# Patient Record
Sex: Male | Born: 1970
Health system: Southern US, Community
[De-identification: ages and names within clinical notes are randomized; demographics above are authoritative.]

## PROBLEM LIST (undated history)

## (undated) ENCOUNTER — Emergency Department (HOSPITAL_COMMUNITY): Payer: Medicare Other

## (undated) DIAGNOSIS — E1143 Type 2 diabetes mellitus with diabetic autonomic (poly)neuropathy: Secondary | ICD-10-CM

## (undated) DIAGNOSIS — R062 Wheezing: Secondary | ICD-10-CM

## (undated) DIAGNOSIS — K3184 Gastroparesis: Secondary | ICD-10-CM

## (undated) DIAGNOSIS — E8770 Fluid overload, unspecified: Secondary | ICD-10-CM

## (undated) DIAGNOSIS — N189 Chronic kidney disease, unspecified: Secondary | ICD-10-CM

## (undated) DIAGNOSIS — I10 Essential (primary) hypertension: Secondary | ICD-10-CM

## (undated) DIAGNOSIS — U071 COVID-19: Secondary | ICD-10-CM

## (undated) DIAGNOSIS — Z515 Encounter for palliative care: Secondary | ICD-10-CM

## (undated) DIAGNOSIS — D649 Anemia, unspecified: Secondary | ICD-10-CM

## (undated) DIAGNOSIS — N179 Acute kidney failure, unspecified: Secondary | ICD-10-CM

## (undated) DIAGNOSIS — I509 Heart failure, unspecified: Secondary | ICD-10-CM

## (undated) DIAGNOSIS — N186 End stage renal disease: Secondary | ICD-10-CM

## (undated) DIAGNOSIS — K859 Acute pancreatitis without necrosis or infection, unspecified: Secondary | ICD-10-CM

## (undated) DIAGNOSIS — R110 Nausea: Secondary | ICD-10-CM

## (undated) DIAGNOSIS — G5790 Unspecified mononeuropathy of unspecified lower limb: Secondary | ICD-10-CM

## (undated) DIAGNOSIS — R1013 Epigastric pain: Secondary | ICD-10-CM

## (undated) DIAGNOSIS — R0600 Dyspnea, unspecified: Secondary | ICD-10-CM

## (undated) DIAGNOSIS — R0902 Hypoxemia: Secondary | ICD-10-CM

## (undated) HISTORY — PX: EYE SURGERY: SHX253

---

## 1997-12-13 ENCOUNTER — Emergency Department (HOSPITAL_COMMUNITY): Admission: EM | Admit: 1997-12-13 | Discharge: 1997-12-13 | Payer: Self-pay | Admitting: Emergency Medicine

## 1999-09-01 ENCOUNTER — Emergency Department (HOSPITAL_COMMUNITY): Admission: EM | Admit: 1999-09-01 | Discharge: 1999-09-01 | Payer: Self-pay | Admitting: Emergency Medicine

## 1999-09-01 ENCOUNTER — Encounter: Payer: Self-pay | Admitting: Emergency Medicine

## 2002-12-21 ENCOUNTER — Emergency Department (HOSPITAL_COMMUNITY): Admission: EM | Admit: 2002-12-21 | Discharge: 2002-12-21 | Payer: Self-pay | Admitting: Emergency Medicine

## 2002-12-25 ENCOUNTER — Emergency Department (HOSPITAL_COMMUNITY): Admission: EM | Admit: 2002-12-25 | Discharge: 2002-12-25 | Payer: Self-pay | Admitting: Emergency Medicine

## 2002-12-26 ENCOUNTER — Emergency Department (HOSPITAL_COMMUNITY): Admission: EM | Admit: 2002-12-26 | Discharge: 2002-12-26 | Payer: Self-pay | Admitting: Emergency Medicine

## 2003-09-26 ENCOUNTER — Emergency Department (HOSPITAL_COMMUNITY): Admission: EM | Admit: 2003-09-26 | Discharge: 2003-09-27 | Payer: Self-pay | Admitting: Emergency Medicine

## 2004-07-05 ENCOUNTER — Emergency Department (HOSPITAL_COMMUNITY): Admission: EM | Admit: 2004-07-05 | Discharge: 2004-07-05 | Payer: Self-pay | Admitting: Emergency Medicine

## 2005-09-26 ENCOUNTER — Emergency Department (HOSPITAL_COMMUNITY): Admission: EM | Admit: 2005-09-26 | Discharge: 2005-09-26 | Payer: Self-pay | Admitting: Emergency Medicine

## 2006-05-04 ENCOUNTER — Emergency Department (HOSPITAL_COMMUNITY): Admission: EM | Admit: 2006-05-04 | Discharge: 2006-05-04 | Payer: Self-pay | Admitting: Emergency Medicine

## 2007-12-21 ENCOUNTER — Inpatient Hospital Stay (HOSPITAL_COMMUNITY): Admission: EM | Admit: 2007-12-21 | Discharge: 2007-12-23 | Payer: Self-pay | Admitting: Emergency Medicine

## 2007-12-29 ENCOUNTER — Ambulatory Visit: Payer: Self-pay | Admitting: Internal Medicine

## 2007-12-30 ENCOUNTER — Encounter (INDEPENDENT_AMBULATORY_CARE_PROVIDER_SITE_OTHER): Payer: Self-pay | Admitting: Internal Medicine

## 2007-12-30 ENCOUNTER — Ambulatory Visit: Payer: Self-pay | Admitting: *Deleted

## 2007-12-30 LAB — CONVERTED CEMR LAB
BUN: 13 mg/dL (ref 6–23)
Basophils Relative: 0 % (ref 0–1)
Calcium: 9.8 mg/dL (ref 8.4–10.5)
Eosinophils Absolute: 0 10*3/uL (ref 0.0–0.7)
MCHC: 33 g/dL (ref 30.0–36.0)
MCV: 86.6 fL (ref 78.0–100.0)
Neutrophils Relative %: 56 % (ref 43–77)
Platelets: 223 10*3/uL (ref 150–400)
Potassium: 4.2 meq/L (ref 3.5–5.3)
Sodium: 140 meq/L (ref 135–145)
TSH: 1.302 microintl units/mL (ref 0.350–4.50)

## 2008-03-31 ENCOUNTER — Emergency Department (HOSPITAL_COMMUNITY): Admission: EM | Admit: 2008-03-31 | Discharge: 2008-03-31 | Payer: Self-pay | Admitting: Emergency Medicine

## 2008-04-28 ENCOUNTER — Ambulatory Visit: Payer: Self-pay | Admitting: Internal Medicine

## 2008-09-27 ENCOUNTER — Ambulatory Visit: Payer: Self-pay | Admitting: Internal Medicine

## 2008-09-27 LAB — CONVERTED CEMR LAB: Microalb, Ur: 3.33 mg/dL — ABNORMAL HIGH (ref 0.00–1.89)

## 2009-06-15 ENCOUNTER — Ambulatory Visit: Payer: Self-pay | Admitting: Internal Medicine

## 2009-06-15 LAB — CONVERTED CEMR LAB
Calcium: 9.6 mg/dL (ref 8.4–10.5)
Creatinine, Ser: 0.94 mg/dL (ref 0.40–1.50)
Glucose, Bld: 260 mg/dL — ABNORMAL HIGH (ref 70–99)
Sodium: 136 meq/L (ref 135–145)

## 2009-06-30 ENCOUNTER — Emergency Department (HOSPITAL_COMMUNITY): Admission: EM | Admit: 2009-06-30 | Discharge: 2009-06-30 | Payer: Self-pay | Admitting: Family Medicine

## 2009-07-27 ENCOUNTER — Ambulatory Visit: Payer: Self-pay | Admitting: Internal Medicine

## 2009-07-27 LAB — CONVERTED CEMR LAB
ALT: 20 units/L (ref 0–53)
AST: 19 units/L (ref 0–37)
Albumin: 4.6 g/dL (ref 3.5–5.2)
Alkaline Phosphatase: 102 units/L (ref 39–117)
Amphetamine Screen, Ur: NEGATIVE
Basophils Absolute: 0 10*3/uL (ref 0.0–0.1)
Benzodiazepines.: NEGATIVE
Calcium: 9.6 mg/dL (ref 8.4–10.5)
Chloride: 95 meq/L — ABNORMAL LOW (ref 96–112)
Cocaine Metabolites: NEGATIVE
Creatinine,U: 81.6 mg/dL
Eosinophils Relative: 1 % (ref 0–5)
HCT: 46.9 % (ref 39.0–52.0)
LDL Cholesterol: 68 mg/dL (ref 0–99)
Lymphocytes Relative: 26 % (ref 12–46)
Lymphs Abs: 2 10*3/uL (ref 0.7–4.0)
Neutro Abs: 4.8 10*3/uL (ref 1.7–7.7)
Neutrophils Relative %: 64 % (ref 43–77)
Platelets: 200 10*3/uL (ref 150–400)
Potassium: 4 meq/L (ref 3.5–5.3)
Sodium: 136 meq/L (ref 135–145)
TSH: 0.749 microintl units/mL (ref 0.350–4.500)
Total Protein: 8.2 g/dL (ref 6.0–8.3)
WBC: 7.6 10*3/uL (ref 4.0–10.5)

## 2010-02-07 ENCOUNTER — Ambulatory Visit: Payer: Self-pay | Admitting: Internal Medicine

## 2010-02-07 LAB — CONVERTED CEMR LAB: Microalb, Ur: 1.34 mg/dL (ref 0.00–1.89)

## 2010-06-01 ENCOUNTER — Encounter (INDEPENDENT_AMBULATORY_CARE_PROVIDER_SITE_OTHER): Payer: Self-pay | Admitting: Internal Medicine

## 2010-06-01 LAB — CONVERTED CEMR LAB
ALT: 73 units/L — ABNORMAL HIGH (ref 0–53)
AST: 67 units/L — ABNORMAL HIGH (ref 0–37)
CO2: 24 meq/L (ref 19–32)
Calcium: 10.1 mg/dL (ref 8.4–10.5)
Chloride: 95 meq/L — ABNORMAL LOW (ref 96–112)
Cholesterol: 177 mg/dL (ref 0–200)
Creatinine, Ser: 0.9 mg/dL (ref 0.40–1.50)
Sodium: 136 meq/L (ref 135–145)
Total Bilirubin: 0.7 mg/dL (ref 0.3–1.2)
Total CHOL/HDL Ratio: 2
Total Protein: 8.7 g/dL — ABNORMAL HIGH (ref 6.0–8.3)
VLDL: 18 mg/dL (ref 0–40)

## 2010-10-30 NOTE — H&P (Signed)
NAMEHURSEL, MCNELL NO.:  [id number]   MEDICAL RECORD NO.:  [id number]          PATIENT TYPE:  INP   LOCATION:  0104                         FACILITY[facility]  [facility]   PHYSICIAN:  Rise Patience, MDDATE OF BIRTH:  July 29, 1970   DATE OF ADMISSION:  12/21/2007  DATE OF DISCHARGE:                              HISTORY & PHYSICAL   CHIEF COMPLAINT:  Chest pain.   HISTORY OF PRESENTING ILLNESS:  A 40 year old male who was recently  diagnosed with diabetes mellitus type 2 and hypertension last month  presented to the ER complaining of chest pain.  The patient stated that  he has been having chest pain the last 3-4 days and he has been not  taking his medication as he was not able to afford it.  Chest pain is  retrosternal, moving to his left arm, was there for the last 3-4 days.  It is happening episodically, not with exertion.  Lasts around a half an  hour to 1 hour.  Associated with chest pain with shortness of breath,  but today the chest pain was persistent since 6 a.m. when he came to the  ER.  Now the patient is presently chest pain free.  Denies any  dizziness, loss of consciousness, nausea, vomiting, diarrhea, fever,  chills, cough, or any weakness of limbs.  The patient has been admitted  for further management and evaluation of his chest pain.   PAST MEDICAL HISTORY:  Hypertension and diabetes mellitus type 2 which  was recently diagnosed.   PAST SURGICAL HISTORY:  None.   SOCIAL HISTORY:  The patient usually smokes cigarettes, quit a year ago.  Drinks alcohol occasionally, once a week.  Denies any drug abuse.   FAMILY HISTORY:  Significant for diabetes and hypertension.   MEDICATIONS PRIOR TO ADMISSION:  None, but was told to take lisinopril,  hydrochlorothiazide, Glucotrol and metformin.   ALLERGIES:  No known drug allergies.   REVIEW OF SYSTEMS:  As in history of presenting illness.  Nothing else  significant.   PHYSICAL EXAMINATION:  The patient  examined at bedside, not in acute  distress.  VITAL SIGNS:  Blood pressure is 134/91, though at times it had gone from  168/110, and heart rate presently is 90 per minute but had gone up to  114 per minute, respirations 18 per minute, temperature 97.3, O2  saturation 100%.  HEENT:  Anicteric, no conjunctival pallor.  CHEST:  Bilateral air entry present.  No rhonchi, no crepitation.  HEART:  S1, S2 heard.  ABDOMEN:  Soft, nontender, bowel sounds heard.  No guarding, no  rigidity.  CENTRAL NERVOUS SYSTEM:  Alert, awake, oriented to time, place and  person.  Moves upper and lower extremities 5/5.  Peripheral pulses felt.  No edema.   LABORATORIES:  Chest x-ray:  Nothing acute.  EKG:  Normal sinus rhythm  with no acute ST-T changes.  Hemoglobin and hematocrit is 17.3 and 51.  D-dimer is 0.28.  PT/INR is 12.8 and 0.9.  Basic metabolic panel:  Sodium Q000111Q, potassium 4.2, chloride 104, glucose 249, BUN 14, creatinine  1,  lipase 40.  CK-MB less than 1 x2, troponin I less than  0.05 x2.  Drug screen is negative.  UA is showing glucose more than 1000, ketones  trace, nitrite and leukocytes negative, WBC negative.   ASSESSMENT:  1. Chest pain, to rule out acute coronary syndrome.  2. Uncontrolled hypertension.  3. Uncontrolled diabetes mellitus type 2.   PLAN:  Admit the patient to telemetry under Team C of [place].  Will cycle cardiac markers.  Will get a CT of the chest.  Will place the  patient on metoprolol and lisinopril.  Recheck basic metabolic STAT to  check for anion gap.  If anion gap is high we probably need IV insulin.  Place patient on Lantus insulin for now.  Further recommendations as  patient condition evolves.      Rise Patience, MD  Electronically Signed     ANK/MEDQ  D:  12/21/2007  T:  12/21/2007  Job:  [id number]

## 2010-10-30 NOTE — Discharge Summary (Signed)
NAMEMarland Kitchen  Christopher Davila, Christopher Davila NO.:  [id number]   MEDICAL RECORD NO.:  [id number]          PATIENT TYPE:  INP   LOCATION:  [id number]                         FACILITY:  [facility]   PHYSICIAN:  Rise Patience, MDDATE OF BIRTH:  07-25-1970   DATE OF ADMISSION:  12/21/2007  DATE OF DISCHARGE:                               DISCHARGE SUMMARY   HOSPITAL COURSE:  A 40 year old male with recently diagnosed diabetes  mellitus type 2 and hypertension a month ago presented with chest pain.  On admission, the patient also was found to have high blood pressures  and blood sugar was more than 200.  The patient was admitted for further  workup and management of the chest pain and uncontrolled blood pressure  and diabetes.  On admission, the patient had an EKG which did not show  any acute findings.  Cardiac enzymes were followed serially which also  were within normal limits.  CT angio of the chest was done which did not  show any pulmonary emboli or any thoracic aortic aneurysm or dissection.  As an outpatient, the patient did not take his medication as advised due  to some financial reasons.  At this time, the patient's chest pain has  completely resolved, his blood pressure is well controlled, and his  blood sugars are getting more reasonably controlled.  During his stay,  Amaryl was added along with sliding scale insulin.  Metformin will be  restarted on December 24, 2007, to give more than 48 hours break after his  CT chest with contrast.  At this time, arrangements are being made to  discharge the patient home.   FINAL DIAGNOSES:  1. Atypical chest pains.  2. Uncontrolled hypertension.  3. Uncontrolled diabetes mellitus type 2.   DISCHARGE MEDICATIONS:  1. Glimepiride 2 mg p.o. daily.  2. Metformin 500 mg p.o. daily.  3. Metoprolol 25 mg p.o. b.i.d.  4. Lisinopril 20 mg p.o. daily.   PLAN:  The patient advised to follow up with his primary care physician  within 3 days and recheck  his basic metabolic panel.  The patient  advised to be cautious about hyperglycemia.  To restart his metformin  only from December 24, 2007.  The patient to check his blood sugar daily in  a.m., to be compliant with his medications.      Rise Patience, MD  Electronically Signed     ANK/MEDQ  D:  12/23/2007  T:  12/23/2007  Job:  [id number]

## 2011-01-20 ENCOUNTER — Inpatient Hospital Stay (INDEPENDENT_AMBULATORY_CARE_PROVIDER_SITE_OTHER)
Admission: RE | Admit: 2011-01-20 | Discharge: 2011-01-20 | Disposition: A | Discharge status: Home / Self Care | Payer: Self-pay | Source: Ambulatory Visit | Attending: Family Medicine | Admitting: Family Medicine

## 2011-01-20 ENCOUNTER — Ambulatory Visit (INDEPENDENT_AMBULATORY_CARE_PROVIDER_SITE_OTHER): Payer: Self-pay

## 2011-01-20 DIAGNOSIS — IMO0002 Reserved for concepts with insufficient information to code with codable children: Secondary | ICD-10-CM

## 2011-03-14 LAB — LIPID PANEL
HDL: 52
LDL Cholesterol: 47
Triglycerides: 114
VLDL: 23

## 2011-03-14 LAB — POCT I-STAT, CHEM 8
BUN: 14
Chloride: 104
Creatinine, Ser: 1
Potassium: 4.2
Sodium: 134 — ABNORMAL LOW
TCO2: 24

## 2011-03-14 LAB — CARDIAC PANEL(CRET KIN+CKTOT+MB+TROPI)
CK, MB: 0.8
Relative Index: 0.5
Relative Index: 0.5

## 2011-03-14 LAB — URINALYSIS, ROUTINE W REFLEX MICROSCOPIC
Glucose, UA: >1000 — AB
Hgb urine dipstick: NEGATIVE
Protein, ur: NEGATIVE
Urobilinogen, UA: 0.2

## 2011-03-14 LAB — LIPASE, BLOOD: Lipase: 40

## 2011-03-14 LAB — RAPID URINE DRUG SCREEN, HOSP PERFORMED
Amphetamines: NOT DETECTED
Barbiturates: NOT DETECTED
Benzodiazepines: NOT DETECTED
Cocaine: NOT DETECTED
Opiates: NOT DETECTED
Tetrahydrocannabinol: NOT DETECTED

## 2011-03-14 LAB — PROTIME-INR
INR: 0.9
Prothrombin Time: 12.8

## 2011-03-14 LAB — BASIC METABOLIC PANEL
BUN: 10
BUN: 12
CO2: 23
CO2: 25
Calcium: 8.4
Chloride: 105
Creatinine, Ser: 0.81
Glucose, Bld: 161 — ABNORMAL HIGH
Sodium: 137

## 2011-03-14 LAB — POCT CARDIAC MARKERS
CKMB, poc: <1 — ABNORMAL LOW
CKMB, poc: <1 — ABNORMAL LOW
Myoglobin, poc: 38.6
Operator id: 264031

## 2011-03-14 LAB — HEMOGLOBIN A1C: Hgb A1c MFr Bld: 9.4 — ABNORMAL HIGH

## 2011-03-14 LAB — URINE MICROSCOPIC-ADD ON

## 2011-03-19 LAB — DIFFERENTIAL
Basophils Absolute: 0.1
Basophils Relative: 1
Eosinophils Absolute: 0
Eosinophils Relative: 0
Lymphocytes Relative: 23
Lymphs Abs: 2
Monocytes Absolute: 0.8
Monocytes Relative: 9
Neutro Abs: 5.7
Neutrophils Relative %: 66

## 2011-03-19 LAB — CBC
HCT: 46.5
Hemoglobin: 15.6
MCV: 89.6
RBC: 5.19
WBC: 8.7

## 2011-03-19 LAB — BASIC METABOLIC PANEL
Chloride: 102
GFR calc Af Amer: >60
Potassium: 4

## 2011-03-25 ENCOUNTER — Inpatient Hospital Stay (HOSPITAL_COMMUNITY)
Admission: RE | Admit: 2011-03-25 | Discharge: 2011-03-25 | Disposition: A | Discharge status: Home / Self Care | Payer: Self-pay | Source: Ambulatory Visit | Attending: Family Medicine | Admitting: Family Medicine

## 2011-04-14 ENCOUNTER — Emergency Department (HOSPITAL_COMMUNITY)
Admission: EM | Admit: 2011-04-14 | Discharge: 2011-04-15 | Discharge status: Against Medical Advice | Payer: Self-pay | Attending: Emergency Medicine | Admitting: Emergency Medicine

## 2011-04-15 ENCOUNTER — Emergency Department (HOSPITAL_COMMUNITY)
Admission: EM | Admit: 2011-04-15 | Discharge: 2011-04-15 | Disposition: A | Discharge status: Home / Self Care | Payer: Self-pay | Attending: Emergency Medicine | Admitting: Emergency Medicine

## 2011-04-15 ENCOUNTER — Emergency Department (HOSPITAL_COMMUNITY): Payer: Self-pay

## 2011-04-15 DIAGNOSIS — I10 Essential (primary) hypertension: Secondary | ICD-10-CM | POA: Insufficient documentation

## 2011-04-15 DIAGNOSIS — E785 Hyperlipidemia, unspecified: Secondary | ICD-10-CM | POA: Insufficient documentation

## 2011-04-15 DIAGNOSIS — S61409A Unspecified open wound of unspecified hand, initial encounter: Secondary | ICD-10-CM | POA: Insufficient documentation

## 2011-04-15 DIAGNOSIS — Z79899 Other long term (current) drug therapy: Secondary | ICD-10-CM | POA: Insufficient documentation

## 2011-04-15 DIAGNOSIS — W268XXA Contact with other sharp object(s), not elsewhere classified, initial encounter: Secondary | ICD-10-CM | POA: Insufficient documentation

## 2011-04-15 DIAGNOSIS — E119 Type 2 diabetes mellitus without complications: Secondary | ICD-10-CM | POA: Insufficient documentation

## 2011-04-30 ENCOUNTER — Encounter: Payer: Self-pay | Admitting: Cardiology

## 2011-04-30 ENCOUNTER — Emergency Department (INDEPENDENT_AMBULATORY_CARE_PROVIDER_SITE_OTHER)
Admission: EM | Admit: 2011-04-30 | Discharge: 2011-04-30 | Disposition: A | Discharge status: Home / Self Care | Payer: Self-pay | Source: Home / Self Care | Attending: Emergency Medicine | Admitting: Emergency Medicine

## 2011-04-30 DIAGNOSIS — Z4802 Encounter for removal of sutures: Secondary | ICD-10-CM

## 2011-04-30 HISTORY — DX: Unspecified mononeuropathy of unspecified lower limb: G57.90

## 2011-04-30 HISTORY — DX: Essential (primary) hypertension: I10

## 2011-04-30 NOTE — ED Notes (Signed)
Pt here for suture removal from dorsal side of left hand. Edges of wound well approximated. No redness or drainage. Denies fever.

## 2011-04-30 NOTE — ED Provider Notes (Signed)
History     CSN: [id number] Arrival date & time: 04/30/2011  9:51 AM   First MD Initiated Contact with Patient 04/30/11 [phone]      Chief Complaint  Patient presents with  . Suture / Staple Removal    suture removal left dorsal side of hand    Patient is a 40 y.o. male presenting with suture removal. The history is provided by the patient.  Suture / Staple Removal  The sutures were placed 7 to 10 days ago. Treatments since wound repair include a wound recheck. His temperature was unmeasured prior to arrival. There has been no drainage from the wound. There is no redness present. There is no swelling present. The pain has no pain. He has no difficulty moving the affected extremity or digit.    Past Medical History  Diagnosis Date  . Diabetes mellitus   . Hypertension   . Neuropathy of lower extremity     History reviewed. No pertinent past surgical history.  Family History  Problem Relation Age of Onset  . Diabetes Mother   . Diabetes Maternal Aunt     History  Substance Use Topics  . Smoking status: Never Smoker   . Smokeless tobacco: Not on file  . Alcohol Use: Yes     occas      Review of Systems  Skin: Negative.   Neurological: Negative for weakness and numbness.    Allergies  Review of patient's allergies indicates no known allergies.  Home Medications   Current Outpatient Rx  Name Route Sig Dispense Refill  . LISINOPRIL PO Oral Take 25 mg by mouth daily.      Marland Kitchen METFORMIN HCL 1000 MG PO TABS Oral Take 1,000 mg by mouth 2 (two) times daily with a meal.      . METOPROLOL SUCCINATE 25 MG PO TB24 Oral Take 25 mg by mouth daily.      . TRAMADOL HCL 50 MG PO TABS Oral Take 50 mg by mouth every 6 (six) hours as needed. Maximum dose= 8 tablets per day       BP 154/103  Pulse 84  Temp(Src) 98.3 F (36.8 C) (Oral)  Resp 16  SpO2 100%  Physical Exam  Constitutional: He appears well-developed and well-nourished.  Musculoskeletal: He exhibits no tenderness.        Hands: Neurological: He is alert.  Skin: No rash noted. No erythema.    ED Course  Procedures (including critical care time)  Labs Reviewed - No data to display No results found.   1. Encounter for removal of sutures       MDM  UNCOMPLICATED suture removal         Rosana Hoes, MD 04/30/11 1759

## 2011-12-12 ENCOUNTER — Encounter (HOSPITAL_COMMUNITY): Payer: Self-pay | Admitting: Emergency Medicine

## 2011-12-12 ENCOUNTER — Emergency Department (HOSPITAL_COMMUNITY)
Admission: EM | Admit: 2011-12-12 | Discharge: 2011-12-13 | Disposition: A | Discharge status: Home / Self Care | Payer: Self-pay | Attending: Emergency Medicine | Admitting: Emergency Medicine

## 2011-12-12 DIAGNOSIS — S39012A Strain of muscle, fascia and tendon of lower back, initial encounter: Secondary | ICD-10-CM

## 2011-12-12 DIAGNOSIS — I10 Essential (primary) hypertension: Secondary | ICD-10-CM | POA: Insufficient documentation

## 2011-12-12 DIAGNOSIS — G609 Hereditary and idiopathic neuropathy, unspecified: Secondary | ICD-10-CM | POA: Insufficient documentation

## 2011-12-12 DIAGNOSIS — X58XXXA Exposure to other specified factors, initial encounter: Secondary | ICD-10-CM | POA: Insufficient documentation

## 2011-12-12 DIAGNOSIS — E119 Type 2 diabetes mellitus without complications: Secondary | ICD-10-CM | POA: Insufficient documentation

## 2011-12-12 DIAGNOSIS — Z79899 Other long term (current) drug therapy: Secondary | ICD-10-CM | POA: Insufficient documentation

## 2011-12-12 DIAGNOSIS — S335XXA Sprain of ligaments of lumbar spine, initial encounter: Secondary | ICD-10-CM | POA: Insufficient documentation

## 2011-12-12 NOTE — ED Notes (Signed)
Pt on back board for spinal immobilization per EMS.

## 2011-12-12 NOTE — ED Notes (Signed)
Pt states he was at a barbeque last week when he had a sudden onset of sharp back pain in his mid upper back. Pt denies injury. Pt states he has been drinking alcohol this pm PTA. Pt reports having four beers and one glass of liquor.

## 2011-12-12 NOTE — ED Notes (Signed)
[id number] Expected date:12/12/11<BR> Expected time:11:24 PM<BR> Means of arrival:Ambulance<BR> Comments:<BR> Back pain/ETOH

## 2011-12-13 LAB — POCT I-STAT, CHEM 8
BUN: 8 mg/dL (ref 6–23)
Chloride: 102 mEq/L (ref 96–112)
Sodium: 140 mEq/L (ref 135–145)

## 2011-12-13 LAB — GLUCOSE, CAPILLARY: Glucose-Capillary: 252 mg/dL — ABNORMAL HIGH (ref 70–99)

## 2011-12-13 MED ORDER — METFORMIN HCL 1000 MG PO TABS: 1000.0000 mg | ORAL_TABLET | Freq: Two times a day (BID) | ORAL | Status: DC

## 2011-12-13 MED ORDER — LISINOPRIL 20 MG PO TABS: 20.0000 mg | ORAL_TABLET | Freq: Every day | ORAL | Status: DC

## 2011-12-13 MED ORDER — METOPROLOL SUCCINATE ER 25 MG PO TB24: 25.0000 mg | ORAL_TABLET | Freq: Every day | ORAL | Status: DC

## 2011-12-13 MED ORDER — SODIUM CHLORIDE 0.9 % IV BOLUS (SEPSIS)
1000.0000 mL | Freq: Once | INTRAVENOUS | Status: AC
  Administered 2011-12-13: 1000 mL via INTRAVENOUS

## 2011-12-13 NOTE — ED Provider Notes (Signed)
History     CSN: [id number]  Arrival date & time 12/12/11  2335   First MD Initiated Contact with Patient 12/13/11 0009      Chief Complaint  Patient presents with  . Back Pain    Spontaneous back pain for one week; denies injury    HPI  History provided by the patient. Patient is a 41 year old male with history of hypertension and diabetes who presents with complaints of chronic low back pains. Patient reports long history of similar back pains. Patient had a sharp increase in pains one week ago that has been waxing and waning since that time.  Pain increased today.  Patient denies any new injury or trauma.  patient has not taken anything for his symptoms. Patient does admit to some alcohol use this evening while at a party.this has helped some with pains. Patient denies any other aggravating or alleviating factors. He denies any other associated symptoms. Denies any numbness weakness in lower extremities. Denies any urinary or fecal incontinence, urinary retention or perineal numbness.     Past Medical History  Diagnosis Date  . Diabetes mellitus   . Hypertension   . Neuropathy of lower extremity     History reviewed. No pertinent past surgical history.  Family History  Problem Relation Age of Onset  . Diabetes Mother   . Diabetes Maternal Aunt     History  Substance Use Topics  . Smoking status: Never Smoker   . Smokeless tobacco: Not on file  . Alcohol Use: Yes     occas      Review of Systems  HENT: Positive for neck pain.   Respiratory: Negative for shortness of breath.   Cardiovascular: Negative for chest pain.  Gastrointestinal: Negative for nausea, vomiting and abdominal pain.  Genitourinary: Negative for dysuria, frequency, hematuria and flank pain.  Musculoskeletal: Positive for back pain.    Allergies  Review of patient's allergies indicates no known allergies.  Home Medications   Current Outpatient Rx  Name Route Sig Dispense Refill  .  LISINOPRIL 20 MG PO TABS Oral Take 20 mg by mouth daily.    Marland Kitchen METFORMIN HCL 1000 MG PO TABS Oral Take 1,000 mg by mouth 2 (two) times daily with a meal.      . METOPROLOL SUCCINATE ER 25 MG PO TB24 Oral Take 25 mg by mouth daily.       BP 129/74  Pulse 102  Temp 98.2 F (36.8 C) (Oral)  Resp 16  Ht 5\' 3"  (1.6 m)  Wt 160 lb (72.576 kg)  BMI 28.34 kg/m2  SpO2 97%  Physical Exam  Nursing note and vitals reviewed. Constitutional: He is oriented to person, place, and time. He appears well-developed and well-nourished. No distress.  HENT:  Head: Normocephalic and atraumatic.  Cardiovascular: Normal rate and regular rhythm.   Pulmonary/Chest: Effort normal and breath sounds normal. No respiratory distress. He has no wheezes. He has no rales.  Abdominal: Soft. He exhibits no distension. There is no tenderness. There is no rebound, no guarding and no CVA tenderness.  Musculoskeletal: Normal range of motion.       Lumbar back: He exhibits tenderness.       Back:       Mild back tenderness.  Neurological: He is alert and oriented to person, place, and time. Gait normal.  Skin: Skin is warm.  Psychiatric: He has a normal mood and affect. His behavior is normal.    ED Course  Procedures  Results for orders placed during the hospital encounter of 12/12/11  GLUCOSE, CAPILLARY      Component Value Range   Glucose-Capillary 252 (*) 70 - 99 mg/dL  POCT I-STAT, CHEM 8      Component Value Range   Sodium 140  135 - 145 mEq/L   Potassium 3.6  3.5 - 5.1 mEq/L   Chloride 102  96 - 112 mEq/L   BUN 8  6 - 23 mg/dL   Creatinine, Ser 1.10  0.50 - 1.35 mg/dL   Glucose, Bld 252 (*) 70 - 99 mg/dL   Calcium, Ion 1.12  1.12 - 1.32 mmol/L   TCO2 22  0 - 100 mmol/L   Hemoglobin 16.3  13.0 - 17.0 g/dL   HCT 48.0  39.0 - 52.0 %      1. Lumbar strain       MDM  Patient no acute distress. Patient reports history of similar back pains. No new injury or trauma. Patient denies any fall  tonight.  Patient does admit to some alcohol use tonight patient is a mature without assistance, it is not slurred speech, patient is not appear clinically intoxicated       Martie Lee, [place] 12/13/11 [phone]

## 2011-12-13 NOTE — ED Notes (Signed)
Pt taken off spine board by PA.

## 2011-12-13 NOTE — Discharge Instructions (Signed)
You were seen and evaluated for your complaints of low back pains. At this time your providers feel your symptoms cause from a concerning condition. Your blood sugar was found to be slightly elevated today. Your given IV fluids to help with this. Please continue your medications as prescribed for your blood pressure and diabetes.  Please followup with a primary care provider for continued evaluation and treatment.   Back Pain, Adult Low back pain is very common. About 1 in 5 people have back pain.The cause of low back pain is rarely dangerous. The pain often gets better over time.About half of people with a sudden onset of back pain feel better in just 2 weeks. About 8 in 10 people feel better by 6 weeks.  CAUSES Some common causes of back pain include:  Strain of the muscles or ligaments supporting the spine.   Wear and tear (degeneration) of the spinal discs.   Arthritis.   Direct injury to the back.  DIAGNOSIS Most of the time, the direct cause of low back pain is not known.However, back pain can be treated effectively even when the exact cause of the pain is unknown.Answering your caregiver's questions about your overall health and symptoms is one of the most accurate ways to make sure the cause of your pain is not dangerous. If your caregiver needs more information, he or she may order lab work or imaging tests (X-rays or MRIs).However, even if imaging tests show changes in your back, this usually does not require surgery. HOME CARE INSTRUCTIONS For many people, back pain returns.Since low back pain is rarely dangerous, it is often a condition that people can learn to [facility] their own.   Remain active. It is stressful on the back to sit or stand in one place. Do not sit, drive, or stand in one place for more than 30 minutes at a time. Take short walks on level surfaces as soon as pain allows.Try to increase the length of time you walk each day.   Do not stay in bed.Resting more  than 1 or 2 days can delay your recovery.   Do not avoid exercise or work.Your body is made to move.It is not dangerous to be active, even though your back may hurt.Your back will likely heal faster if you return to being active before your pain is gone.   Pay attention to your body when you bend and lift. Many people have less discomfortwhen lifting if they bend their knees, keep the load close to their bodies,and avoid twisting. Often, the most comfortable positions are those that put less stress on your recovering back.   Find a comfortable position to sleep. Use a firm mattress and lie on your side with your knees slightly bent. If you lie on your back, put a pillow under your knees.   Only take over-the-counter or prescription medicines as directed by your caregiver. Over-the-counter medicines to reduce pain and inflammation are often the most helpful.Your caregiver may prescribe muscle relaxant drugs.These medicines help dull your pain so you can more quickly return to your normal activities and healthy exercise.   Put ice on the injured area.   Put ice in a plastic bag.   Place a towel between your skin and the bag.   Leave the ice on for 15 to 20 minutes, 3 to 4 times a day for the first 2 to 3 days. After that, ice and heat may be alternated to reduce pain and spasms.   Ask your  caregiver about trying back exercises and gentle massage. This may be of some benefit.   Avoid feeling anxious or stressed.Stress increases muscle tension and can worsen back pain.It is important to recognize when you are anxious or stressed and learn ways to manage it.Exercise is a great option.  SEEK MEDICAL CARE IF:  You have pain that is not relieved with rest or medicine.   You have pain that does not improve in 1 week.   You have new symptoms.   You are generally not feeling well.  SEEK IMMEDIATE MEDICAL CARE IF:   You have pain that radiates from your back into your legs.   You  develop new bowel or bladder control problems.   You have unusual weakness or numbness in your arms or legs.   You develop nausea or vomiting.   You develop abdominal pain.   You feel faint.  Document Released: 06/03/2005 Document Revised: 05/23/2011 Document Reviewed: 10/22/2010 [facility] Patient Information 2012 [place].    Back Exercises Back exercises help treat and prevent back injuries. The goal of back exercises is to increase the strength of your abdominal and back muscles and the flexibility of your back. These exercises should be started when you no longer have back pain. Back exercises include:  Pelvic Tilt. Lie on your back with your knees bent. Tilt your pelvis until the lower part of your back is against the floor. Hold this position 5 to 10 sec and repeat 5 to 10 times.   Knee to Chest. Pull first 1 knee up against your chest and hold for 20 to 30 seconds, repeat this with the other knee, and then both knees. This may be done with the other leg straight or bent, whichever feels better.   Sit-Ups or Curl-Ups. Bend your knees 90 degrees. Start with tilting your pelvis, and do a partial, slow sit-up, lifting your trunk only 30 to 45 degrees off the floor. Take at least 2 to 3 seconds for each sit-up. Do not do sit-ups with your knees out straight. If partial sit-ups are difficult, simply do the above but with only tightening your abdominal muscles and holding it as directed.   Hip-Lift. Lie on your back with your knees flexed 90 degrees. Push down with your feet and shoulders as you raise your hips a couple inches off the floor; hold for 10 seconds, repeat 5 to 10 times.   Back arches. Lie on your stomach, propping yourself up on bent elbows. Slowly press on your hands, causing an arch in your low back. Repeat 3 to 5 times. Any initial stiffness and discomfort should lessen with repetition over time.   Shoulder-Lifts. Lie face down with arms beside your body. Keep hips  and torso pressed to floor as you slowly lift your head and shoulders off the floor.  Do not overdo your exercises, especially in the beginning. Exercises may cause you some mild back discomfort which lasts for a few minutes; however, if the pain is more severe, or lasts for more than 15 minutes, do not continue exercises until you see your caregiver. Improvement with exercise therapy for back problems is slow.  See your caregivers for assistance with developing a proper back exercise program. Document Released: 07/11/2004 Document Revised: 05/23/2011 Document Reviewed: 06/03/2005 [facility] Patient Information 2012 [place].     RESOURCE GUIDE  Chronic Pain Problems: Contact [facility]  [phone] Patients need to be referred by their primary care doctor.  Insufficient Money  for Medicine: Contact [employer]:  call "211" or [place] [phone].  No Primary Care Doctor: - Call [facility]  [phone] - can help you locate a primary care doctor that  accepts your insurance, provides certain services, etc. - Physician Referral Service- [phone]  Agencies that provide inexpensive medical care: - [facility]  [facility]  [phone] - [facility]  [phone] - [facility]  [phone] - Planned Parenthood  [phone] - [facility]  [phone]  [place] Providers: - [facility]- [address]  [phone], Mon-Fri 9am-7pm, Sat 9am-1pm - [place], [address] [place]  [place], [address] [place]  [place]- [address]  [place], [address], [phone]  Only accepts [place] patients after they have their name  applied to their card  Self Pay (no insurance) in  [place]: - Sickle Cell Patients: Dr Kevan Ny, [facility]  [place], [facility]- [place]  [facility] [place]- [phone] [place] [address], [facility]- see information above (Speak to [employer] if you do not have insurance)       -  [facility]- [place], [place],  [place], [place]  Dr Vista Lawman-  [address], [place], [place] [facility]- [address] [phone]       -  [facility]- [address], [place], also [address] [phone]       -    [facility]- [address], [place], 1st & 3rd Saturday   every month, 10am-1pm  1) Find a Doctor and Pay Out of Pocket Although you won't have to find out who is covered by your insurance plan, it is a good idea to ask around and get recommendations. You will then need to call the office and see if the doctor you have chosen will accept you as a new patient and what types of options they offer for patients who are self-pay. Some doctors offer discounts or will set up payment plans for their patients who do not have insurance, but you will need to ask so you aren't surprised when you get to your appointment.  2) Contact Your Local Health Department Not all health departments have doctors that can see patients for sick visits, but many do, so it is worth a call to see if yours does. If you don't know where your local health department is, you can check in your phone book. The CDC also has a tool to help you locate your state's health department, and many state websites also have listings of all of their local health departments.  3) Find a [place] [facility] If your illness is not likely to be very severe or  complicated,  you may want to try a walk in clinic. These are popping up all over the country in pharmacies, drugstores, and shopping centers. They're usually staffed by nurse practitioners or physician assistants that have been trained to treat common illnesses and complaints. They're usually fairly quick and inexpensive. However, if you have serious medical issues or chronic medical problems, these are probably not your best option  STD Testing - [place], [facility], [address] [place], phone [phone] or [phone].  Monday - Friday, call for an appointment. - [place], [facility], [address], [place], phone [phone] or [phone].  Monday - Friday, call for an appointment.  Abuse/Neglect: - [place] [phone] - [place] [phone] (After Hours)  Emergency Shelter:  [facility] [phone]  Maternity Homes: - [facility] [place] [phone] - [place] [phone]  MRSA Hotline #:   770-569-1646  [facility]. [address]         [place] Phone:  [phone]                                  Phone:  [phone]                   Phone:  [phone]  [place], [place] in [place], [address]                                  [place] [phone] or [phone] (After Hours)   [place]  Substance  Abuse Resources: - Alcohol and Drug Services  [phone] - [place] [phone] - [facility] [phone] - Residential & Outpatient Substance Abuse Program  [phone]  Psychological Services: - [place]  [place]  [place], [phone] [place]. [address] [place], [place]: [phone] or [phone], [employer]  Dental Assistance  If unable to pay or uninsured, contact:  [facility] or [facility]. to become qualified for the adult dental clinic.  Patients with Medicaid: [facility] [phone] [address], [place] [address], [phone]  If unable to pay, or uninsured, contact [facility] [phone]) or [place] [phone] in [place], [place] in [facility]) to become qualified for the adult dental clinic  Other [place]: - [place], [place], [place], [address], [place], [place], 2nd and 4th Thursday of the month at 6:30am.  10 clients each day by appointment, can sometimes see walk-in patients if someone does not show for an appointment. [facility]- [address] [place], [place], [address], [place], [place], [place], [address], [place] Department- [place] Department- [place] Department- [phone]

## 2011-12-14 NOTE — ED Provider Notes (Signed)
Medical screening examination/treatment/procedure(s) were performed by non-physician practitioner and as supervising physician I was immediately available for consultation/collaboration.   Mirna Mires, MD 12/14/11 [phone]

## 2012-02-07 ENCOUNTER — Emergency Department (HOSPITAL_COMMUNITY): Payer: Self-pay

## 2012-02-07 ENCOUNTER — Encounter (HOSPITAL_COMMUNITY): Payer: Self-pay | Admitting: Emergency Medicine

## 2012-02-07 ENCOUNTER — Emergency Department (HOSPITAL_COMMUNITY)
Admission: EM | Admit: 2012-02-07 | Discharge: 2012-02-07 | Disposition: A | Discharge status: Home / Self Care | Payer: Self-pay | Attending: Emergency Medicine | Admitting: Emergency Medicine

## 2012-02-07 DIAGNOSIS — G5790 Unspecified mononeuropathy of unspecified lower limb: Secondary | ICD-10-CM | POA: Insufficient documentation

## 2012-02-07 DIAGNOSIS — E119 Type 2 diabetes mellitus without complications: Secondary | ICD-10-CM | POA: Insufficient documentation

## 2012-02-07 DIAGNOSIS — I498 Other specified cardiac arrhythmias: Secondary | ICD-10-CM | POA: Insufficient documentation

## 2012-02-07 DIAGNOSIS — I10 Essential (primary) hypertension: Secondary | ICD-10-CM | POA: Insufficient documentation

## 2012-02-07 DIAGNOSIS — M940 Chondrocostal junction syndrome [Tietze]: Secondary | ICD-10-CM | POA: Insufficient documentation

## 2012-02-07 LAB — CBC
HCT: 44.2 % (ref 39.0–52.0)
Hemoglobin: 15.4 g/dL (ref 13.0–17.0)
MCV: 86.8 fL (ref 78.0–100.0)
RBC: 5.09 MIL/uL (ref 4.22–5.81)
WBC: 8.1 10*3/uL (ref 4.0–10.5)

## 2012-02-07 LAB — BASIC METABOLIC PANEL
BUN: 13 mg/dL (ref 6–23)
CO2: 27 mEq/L (ref 19–32)
Chloride: 97 mEq/L (ref 96–112)
Creatinine, Ser: 0.8 mg/dL (ref 0.50–1.35)
Glucose, Bld: 314 mg/dL — ABNORMAL HIGH (ref 70–99)

## 2012-02-07 LAB — D-DIMER, QUANTITATIVE: D-Dimer, Quant: <0.22 ug/mL-FEU (ref 0.00–0.48)

## 2012-02-07 MED ORDER — TRAMADOL HCL 50 MG PO TABS: 50.0000 mg | ORAL_TABLET | Freq: Three times a day (TID) | ORAL | Status: AC | PRN

## 2012-02-07 MED ORDER — LISINOPRIL 20 MG PO TABS: 10.0000 mg | ORAL_TABLET | Freq: Every day | ORAL | Status: DC

## 2012-02-07 MED ORDER — IBUPROFEN 800 MG PO TABS
800.0000 mg | ORAL_TABLET | Freq: Once | ORAL | Status: AC
  Administered 2012-02-07: 800 mg via ORAL
  Filled 2012-02-07: qty 1

## 2012-02-07 MED ORDER — TRAMADOL HCL 50 MG PO TABS
50.0000 mg | ORAL_TABLET | Freq: Once | ORAL | Status: AC
  Administered 2012-02-07: 50 mg via ORAL
  Filled 2012-02-07: qty 1

## 2012-02-07 MED ORDER — METOPROLOL SUCCINATE ER 25 MG PO TB24: 25.0000 mg | ORAL_TABLET | Freq: Every day | ORAL | Status: DC

## 2012-02-07 NOTE — ED Notes (Addendum)
Pt c/o left sided CP with radiation to arm; pt sts pain worse with palpation and inspiration x 2 weeks

## 2012-02-07 NOTE — ED Provider Notes (Addendum)
Complains of left-sided pleuritic chest pain for 3 weeks. Symptoms accompanied by mild shortness of breath. Worse with moving his left arm changing positioning or deep inspiration. Eye exam alert nontoxic lungs clear to auscultation heart regular rate and rhythm chest exquisitely tender left side anteriorly reproducing pain exactly. Pain is also easily reproduced by forcible abduction of left shoulder. Patient also has run out of his tramadol, lisinopril, and metoprolol for the past one month.   medical decision-making : Symptoms consistent with musculoskeletal chest pain.  Plan prescriptions lisinopril tramadol and Toprol-XL  Referral resource guide in light of recent closing of health serve, patient's former PMD  Diagnoses #1 chest wall pain  #2 hyperglycemia  #3 tobacco abuse  Orlie Dakin, MD 02/07/12 [place], MD 02/07/12 1727

## 2012-02-07 NOTE — ED Provider Notes (Signed)
Medical screening examination/treatment/procedure(s) were conducted as a shared visit with non-physician practitioner(s) and myself.  I personally evaluated the patient during the encounter  Orlie Dakin, MD 02/07/12 1731

## 2012-02-07 NOTE — ED Provider Notes (Signed)
History     CSN: [id number]  Arrival date & time 02/07/12  1124   First MD Initiated Contact with Patient 02/07/12 1411      Chief Complaint  Patient presents with  . Chest Pain    (Consider location/radiation/quality/duration/timing/severity/associated sxs/prior treatment) HPI Comments: 41 y/o male presents with intermittent left sided chest pain worsening over the past 3 weeks. States pain began on the left side of his back and radiated around to the left side of his chest. His muscles feel tender. Pain worse with taking a deep breath or moving to the side. He has been sleeping sitting up to avoid becoming SOB when laying flat due to pain. Has not tried any alleviating factors. Denies any heavy lifting or unusual activity, rashes, nausea, vomiting, diaphoresis, headaches, lightheadedness, dizziness. Never had chest pain like this before. Admits he has been under increased stress at work and with his girlfriend. Denies any anxiety or palpitations. He has HTN and has not had his lisinopril or toprol xl for a while. Also has tramadol for diabetic neuropathy which he ran out of.   Patient is a 41 y.o. male presenting with chest pain. The history is provided by the patient.  Chest Pain Primary symptoms include shortness of breath (when laying flat). Pertinent negatives for primary symptoms include no palpitations, no nausea, no vomiting and no dizziness.  Pertinent negatives for associated symptoms include no diaphoresis.     Past Medical History  Diagnosis Date  . Diabetes mellitus   . Hypertension   . Neuropathy of lower extremity     History reviewed. No pertinent past surgical history.  Family History  Problem Relation Age of Onset  . Diabetes Mother   . Diabetes Maternal Aunt     History  Substance Use Topics  . Smoking status: Never Smoker   . Smokeless tobacco: Not on file  . Alcohol Use: Yes     occas      Review of Systems  Constitutional: Negative for  diaphoresis.  Respiratory: Positive for shortness of breath (when laying flat).   Cardiovascular: Positive for chest pain. Negative for palpitations.  Gastrointestinal: Negative for nausea and vomiting.  Musculoskeletal: Positive for back pain.  Skin: Negative for rash.  Neurological: Negative for dizziness and light-headedness.    Allergies  Review of patient's allergies indicates no known allergies.  Home Medications   Current Outpatient Rx  Name Route Sig Dispense Refill  . METFORMIN HCL 1000 MG PO TABS Oral Take 1,000 mg by mouth 2 (two) times daily with a meal.        BP 132/55  Pulse 102  Resp 20  SpO2 97%  Physical Exam  Constitutional: He is oriented to person, place, and time. He appears well-developed and well-nourished. No distress.  HENT:  Head: Normocephalic and atraumatic.  Mouth/Throat: Oropharynx is clear and moist.  Eyes: Conjunctivae and EOM are normal.  Neck: Neck supple.  Cardiovascular: Regular rhythm, normal heart sounds and intact distal pulses.  Tachycardia present.   Pulmonary/Chest: Effort normal and breath sounds normal. He exhibits tenderness (left sided chest wall anteriorly and laterally). He exhibits no bony tenderness and no edema.  Abdominal: Soft. Normal appearance and bowel sounds are normal. There is no tenderness.  Neurological: He is alert and oriented to person, place, and time.  Skin: Skin is warm and dry. No rash noted.  Psychiatric: He has a normal mood and affect. His speech is normal and behavior is normal.    ED Course  Procedures (including critical care time)  Labs Reviewed  BASIC METABOLIC PANEL - Abnormal; Notable for the following:    Glucose, Bld 314 (*)     All other components within normal limits  CBC   Dg Chest 2 View  02/07/2012  *RADIOLOGY REPORT*  Clinical Data: Chest pain with shortness of breath.  History of hypertension and diabetes.  CHEST - 2 VIEW  Comparison: 03/31/2008 radiographs.  CT 12/21/2007.   Findings: The heart size and mediastinal contours are normal. The lungs are clear. There is no pleural effusion or pneumothorax. No acute osseous findings are identified.  IMPRESSION: Stable examination.  No active cardiopulmonary process.   Original Report Authenticated By: Vivia Ewing, M.D.     Date: 02/07/2012  Rate: 110  Rhythm: sinus tachycardia  QRS Axis: normal  Intervals: normal  ST/T Wave abnormalities: nonspecific T wave changes  Conduction Disutrbances:none  Narrative Interpretation: no stemi  Old EKG Reviewed: unchanged    1. Costochondritis       MDM  41 y/o male with intermittent chest pain for 3 weeks. Physical exam consistent with costochondritis. Will check troponin and d-dimer. Prescriptions for toprol xl, lisinopril, and tramadol will be given at discharge. 3:54 PM Care passed onto Dr. Winfred Leeds at this time.       Illene Labrador, PA-C 02/07/12 1556

## 2012-06-17 DIAGNOSIS — K859 Acute pancreatitis without necrosis or infection, unspecified: Secondary | ICD-10-CM

## 2012-06-17 HISTORY — DX: Acute pancreatitis without necrosis or infection, unspecified: K85.90

## 2012-06-26 ENCOUNTER — Inpatient Hospital Stay (HOSPITAL_COMMUNITY)
Admission: EM | Admit: 2012-06-26 | Discharge: 2012-06-30 | DRG: 439 | Disposition: A | Discharge status: Home / Self Care | Payer: MEDICAID | Attending: Internal Medicine | Admitting: Internal Medicine

## 2012-06-26 ENCOUNTER — Inpatient Hospital Stay (HOSPITAL_COMMUNITY): Payer: Self-pay

## 2012-06-26 ENCOUNTER — Emergency Department (HOSPITAL_COMMUNITY): Payer: Self-pay

## 2012-06-26 ENCOUNTER — Encounter (HOSPITAL_COMMUNITY): Payer: Self-pay | Admitting: Family Medicine

## 2012-06-26 DIAGNOSIS — I10 Essential (primary) hypertension: Secondary | ICD-10-CM | POA: Diagnosis present

## 2012-06-26 DIAGNOSIS — K859 Acute pancreatitis without necrosis or infection, unspecified: Principal | ICD-10-CM | POA: Diagnosis present

## 2012-06-26 DIAGNOSIS — Z79899 Other long term (current) drug therapy: Secondary | ICD-10-CM

## 2012-06-26 DIAGNOSIS — IMO0002 Reserved for concepts with insufficient information to code with codable children: Secondary | ICD-10-CM

## 2012-06-26 DIAGNOSIS — B370 Candidal stomatitis: Secondary | ICD-10-CM

## 2012-06-26 DIAGNOSIS — R634 Abnormal weight loss: Secondary | ICD-10-CM

## 2012-06-26 DIAGNOSIS — F1011 Alcohol abuse, in remission: Secondary | ICD-10-CM | POA: Diagnosis present

## 2012-06-26 DIAGNOSIS — E1165 Type 2 diabetes mellitus with hyperglycemia: Secondary | ICD-10-CM | POA: Diagnosis present

## 2012-06-26 DIAGNOSIS — Z23 Encounter for immunization: Secondary | ICD-10-CM

## 2012-06-26 DIAGNOSIS — E119 Type 2 diabetes mellitus without complications: Secondary | ICD-10-CM

## 2012-06-26 DIAGNOSIS — G5790 Unspecified mononeuropathy of unspecified lower limb: Secondary | ICD-10-CM | POA: Diagnosis present

## 2012-06-26 DIAGNOSIS — E118 Type 2 diabetes mellitus with unspecified complications: Secondary | ICD-10-CM | POA: Diagnosis present

## 2012-06-26 HISTORY — DX: Candidal stomatitis: B37.0

## 2012-06-26 HISTORY — DX: Abnormal weight loss: R63.4

## 2012-06-26 LAB — COMPREHENSIVE METABOLIC PANEL
AST: 26 U/L (ref 0–37)
CO2: 28 mEq/L (ref 19–32)
Calcium: 9.7 mg/dL (ref 8.4–10.5)
Creatinine, Ser: 0.59 mg/dL (ref 0.50–1.35)
GFR calc Af Amer: >90 mL/min (ref >90)
GFR calc non Af Amer: >90 mL/min (ref >90)
Total Protein: 8.8 g/dL — ABNORMAL HIGH (ref 6.0–8.3)

## 2012-06-26 LAB — CBC
Platelets: 196 10*3/uL (ref 150–400)
RBC: 4.85 MIL/uL (ref 4.22–5.81)
RDW: 12.5 % (ref 11.5–15.5)
WBC: 9.3 10*3/uL (ref 4.0–10.5)

## 2012-06-26 LAB — BASIC METABOLIC PANEL
Calcium: 9.3 mg/dL (ref 8.4–10.5)
Chloride: 100 mEq/L (ref 96–112)
Creatinine, Ser: 0.66 mg/dL (ref 0.50–1.35)
GFR calc Af Amer: >90 mL/min (ref >90)
Sodium: 138 mEq/L (ref 135–145)

## 2012-06-26 LAB — GLUCOSE, CAPILLARY
Glucose-Capillary: 156 mg/dL — ABNORMAL HIGH (ref 70–99)
Glucose-Capillary: 91 mg/dL (ref 70–99)
Glucose-Capillary: 171 mg/dL — ABNORMAL HIGH (ref 70–99)

## 2012-06-26 LAB — CBC WITH DIFFERENTIAL/PLATELET
Eosinophils Absolute: 0.1 10*3/uL (ref 0.0–0.7)
Eosinophils Relative: 1 % (ref 0–5)
Lymphs Abs: 3 10*3/uL (ref 0.7–4.0)
MCH: 29.1 pg (ref 26.0–34.0)
MCHC: 35.7 g/dL (ref 30.0–36.0)
MCV: 81.5 fL (ref 78.0–100.0)
Monocytes Absolute: 0.9 10*3/uL (ref 0.1–1.0)
Platelets: ADEQUATE 10*3/uL (ref 150–400)
RBC: 5.36 MIL/uL (ref 4.22–5.81)
RDW: 12.7 % (ref 11.5–15.5)
Smear Review: ADEQUATE

## 2012-06-26 LAB — CREATININE, SERUM
Creatinine, Ser: 0.62 mg/dL (ref 0.50–1.35)
GFR calc Af Amer: >90 mL/min (ref >90)
GFR calc non Af Amer: >90 mL/min (ref >90)

## 2012-06-26 LAB — TRIGLYCERIDES: Triglycerides: 85 mg/dL (ref <150)

## 2012-06-26 LAB — POCT I-STAT TROPONIN I: Troponin i, poc: 0 ng/mL (ref 0.00–0.08)

## 2012-06-26 LAB — RAPID URINE DRUG SCREEN, HOSP PERFORMED: Opiates: NOT DETECTED

## 2012-06-26 MED ORDER — NYSTATIN 100000 UNIT/ML MT SUSP
5.0000 mL | Freq: Three times a day (TID) | OROMUCOSAL | Status: DC
  Administered 2012-06-27 – 2012-06-30 (×11): 500000 [IU] via ORAL
  Filled 2012-06-26 (×14): qty 5

## 2012-06-26 MED ORDER — INSULIN ASPART 100 UNIT/ML ~~LOC~~ SOLN
0.0000 [IU] | Freq: Three times a day (TID) | SUBCUTANEOUS | Status: DC
  Administered 2012-06-28: 2 [IU] via SUBCUTANEOUS
  Administered 2012-06-29 – 2012-06-30 (×3): 1 [IU] via SUBCUTANEOUS
  Administered 2012-06-30: 2 [IU] via SUBCUTANEOUS

## 2012-06-26 MED ORDER — HYDROCODONE-ACETAMINOPHEN 5-325 MG PO TABS
1.0000 | ORAL_TABLET | ORAL | Status: DC | PRN
  Administered 2012-06-27 – 2012-06-28 (×4): 2 via ORAL
  Administered 2012-06-29: 1 via ORAL
  Administered 2012-06-29 (×3): 2 via ORAL
  Administered 2012-06-29: 1 via ORAL
  Administered 2012-06-30: 2 via ORAL
  Filled 2012-06-26: qty 1
  Filled 2012-06-26 (×4): qty 2
  Filled 2012-06-26: qty 1
  Filled 2012-06-26 (×4): qty 2

## 2012-06-26 MED ORDER — FLEET ENEMA 7-19 GM/118ML RE ENEM
1.0000 | ENEMA | Freq: Once | RECTAL | Status: AC | PRN
  Filled 2012-06-26: qty 1

## 2012-06-26 MED ORDER — ENOXAPARIN SODIUM 40 MG/0.4ML ~~LOC~~ SOLN
40.0000 mg | SUBCUTANEOUS | Status: DC
  Administered 2012-06-26 – 2012-06-29 (×4): 40 mg via SUBCUTANEOUS
  Filled 2012-06-26 (×5): qty 0.4

## 2012-06-26 MED ORDER — ACETAMINOPHEN 325 MG PO TABS: 650.0000 mg | ORAL_TABLET | Freq: Four times a day (QID) | ORAL | Status: DC | PRN

## 2012-06-26 MED ORDER — ONDANSETRON HCL 4 MG/2ML IJ SOLN
4.0000 mg | Freq: Once | INTRAMUSCULAR | Status: AC
  Administered 2012-06-26: 4 mg via INTRAVENOUS
  Filled 2012-06-26: qty 2

## 2012-06-26 MED ORDER — ONDANSETRON HCL 4 MG PO TABS: 4.0000 mg | ORAL_TABLET | Freq: Four times a day (QID) | ORAL | Status: DC | PRN

## 2012-06-26 MED ORDER — ACETAMINOPHEN 650 MG RE SUPP: 650.0000 mg | Freq: Four times a day (QID) | RECTAL | Status: DC | PRN

## 2012-06-26 MED ORDER — THIAMINE HCL 100 MG/ML IJ SOLN
100.0000 mg | Freq: Every day | INTRAMUSCULAR | Status: DC
  Administered 2012-06-27 – 2012-06-28 (×2): 100 mg via INTRAVENOUS
  Filled 2012-06-26 (×3): qty 1

## 2012-06-26 MED ORDER — ONDANSETRON HCL 4 MG/2ML IJ SOLN: 4.0000 mg | Freq: Three times a day (TID) | INTRAMUSCULAR | Status: DC | PRN

## 2012-06-26 MED ORDER — HYDROMORPHONE HCL PF 1 MG/ML IJ SOLN
1.0000 mg | Freq: Once | INTRAMUSCULAR | Status: AC
  Administered 2012-06-26: 1 mg via INTRAVENOUS
  Filled 2012-06-26: qty 1

## 2012-06-26 MED ORDER — HYDRALAZINE HCL 20 MG/ML IJ SOLN
10.0000 mg | Freq: Three times a day (TID) | INTRAMUSCULAR | Status: DC | PRN
  Filled 2012-06-26: qty 0.5

## 2012-06-26 MED ORDER — HYDROMORPHONE HCL PF 1 MG/ML IJ SOLN
1.0000 mg | INTRAMUSCULAR | Status: AC | PRN
  Administered 2012-06-26 – 2012-06-27 (×3): 1 mg via INTRAVENOUS
  Filled 2012-06-26 (×3): qty 1

## 2012-06-26 MED ORDER — ONDANSETRON HCL 4 MG/2ML IJ SOLN: 4.0000 mg | Freq: Four times a day (QID) | INTRAMUSCULAR | Status: DC | PRN

## 2012-06-26 MED ORDER — METOPROLOL TARTRATE 50 MG PO TABS
50.0000 mg | ORAL_TABLET | Freq: Two times a day (BID) | ORAL | Status: DC
  Administered 2012-06-26 – 2012-06-30 (×8): 50 mg via ORAL
  Filled 2012-06-26 (×9): qty 1

## 2012-06-26 MED ORDER — SODIUM CHLORIDE 0.9 % IJ SOLN
3.0000 mL | Freq: Two times a day (BID) | INTRAMUSCULAR | Status: DC
  Administered 2012-06-27 – 2012-06-30 (×4): 3 mL via INTRAVENOUS

## 2012-06-26 MED ORDER — FOLIC ACID 5 MG/ML IJ SOLN
1.0000 mg | Freq: Every day | INTRAMUSCULAR | Status: DC
  Administered 2012-06-27 – 2012-06-29 (×3): 1 mg via INTRAVENOUS
  Filled 2012-06-26 (×4): qty 0.2

## 2012-06-26 MED ORDER — SODIUM CHLORIDE 0.9 % IV SOLN: INTRAVENOUS | Status: AC

## 2012-06-26 MED ORDER — SODIUM CHLORIDE 0.9 % IV SOLN
INTRAVENOUS | Status: DC
  Administered 2012-06-26 – 2012-06-28 (×5): via INTRAVENOUS
  Administered 2012-06-29: 1000 mL via INTRAVENOUS
  Administered 2012-06-29: via INTRAVENOUS

## 2012-06-26 MED ORDER — INFLUENZA VIRUS VACC SPLIT PF IM SUSP
0.5000 mL | INTRAMUSCULAR | Status: AC
  Administered 2012-06-27: 0.5 mL via INTRAMUSCULAR
  Filled 2012-06-26: qty 0.5

## 2012-06-26 MED ORDER — PANTOPRAZOLE SODIUM 40 MG IV SOLR
40.0000 mg | Freq: Once | INTRAVENOUS | Status: AC
  Administered 2012-06-26: 40 mg via INTRAVENOUS
  Filled 2012-06-26: qty 40

## 2012-06-26 MED ORDER — SODIUM CHLORIDE 0.9 % IV BOLUS (SEPSIS)
1000.0000 mL | Freq: Once | INTRAVENOUS | Status: AC
  Administered 2012-06-26: 1000 mL via INTRAVENOUS

## 2012-06-26 MED ORDER — MORPHINE SULFATE 2 MG/ML IJ SOLN
2.0000 mg | INTRAMUSCULAR | Status: DC | PRN
  Administered 2012-06-27: 2 mg via INTRAVENOUS
  Filled 2012-06-26: qty 1

## 2012-06-26 MED ORDER — PANTOPRAZOLE SODIUM 40 MG IV SOLR
40.0000 mg | Freq: Two times a day (BID) | INTRAVENOUS | Status: DC
  Administered 2012-06-26 – 2012-06-29 (×7): 40 mg via INTRAVENOUS
  Filled 2012-06-26 (×10): qty 40

## 2012-06-26 NOTE — ED Notes (Addendum)
Pt c/o rib pain with onset of 2.5 weeks, denies trauma/injury to ribs, hx of inflammation to left ribs Sep 2013. Pt's grandmother gave him 1 tsp castor oil two days ago, pain was alleviated for short while, but returned. Ribs do not appear swollen, but tender to palpation. A&Ox4, ambulatory, nad. Pt did not take bp meds this am.

## 2012-06-26 NOTE — ED Notes (Signed)
Per pt sts pain in rib area. sts hurts when he breathes and bends over. Denies cough, fever. sts he took some gas medication and it worked and then came back.

## 2012-06-26 NOTE — ED Notes (Signed)
CBG 91, no insulin coverage needed before dinner.

## 2012-06-26 NOTE — ED Provider Notes (Signed)
History     CSN: [id number]  Arrival date & time 06/26/12  1248   First MD Initiated Contact with Patient 06/26/12 1503      Chief Complaint  Patient presents with  . Chest Pain    (Consider location/radiation/quality/duration/timing/severity/associated sxs/prior treatment) Patient is a 42 y.o. male presenting with abdominal pain. The history is provided by the patient (the pt complains of epigastric pain for one week.  ). No language interpreter was used.  Abdominal Pain The primary symptoms of the illness include abdominal pain. The primary symptoms of the illness do not include fatigue or diarrhea. The current episode started more than 2 days ago. The onset of the illness was gradual. The problem has not changed since onset. Associated with: nothing. The patient states that she believes she is currently not pregnant. The patient has not had a change in bowel habit. Risk factors: alcohol use. Symptoms associated with the illness do not include chills, hematuria, frequency or back pain. Significant associated medical issues do not include PUD.    Past Medical History  Diagnosis Date  . Diabetes mellitus   . Hypertension   . Neuropathy of lower extremity     History reviewed. No pertinent past surgical history.  Family History  Problem Relation Age of Onset  . Diabetes Mother   . Diabetes Maternal Aunt     History  Substance Use Topics  . Smoking status: Never Smoker   . Smokeless tobacco: Not on file  . Alcohol Use: Yes     Comment: occas      Review of Systems  Constitutional: Negative for chills and fatigue.  HENT: Negative for congestion, sinus pressure and ear discharge.   Eyes: Negative for discharge.  Respiratory: Negative for cough.   Cardiovascular: Negative for chest pain.  Gastrointestinal: Positive for abdominal pain. Negative for diarrhea.  Genitourinary: Negative for frequency and hematuria.  Musculoskeletal: Negative for back pain.  Skin: Negative  for rash.  Neurological: Negative for seizures and headaches.  Hematological: Negative.   Psychiatric/Behavioral: Negative for hallucinations.    Allergies  Review of patient's allergies indicates no known allergies.  Home Medications   Current Outpatient Rx  Name  Route  Sig  Dispense  Refill  . GABAPENTIN 300 MG PO CAPS   Oral   Take 300 mg by mouth 3 (three) times daily.         Marland Kitchen GLIPIZIDE 10 MG PO TABS   Oral   Take 10 mg by mouth daily.         Marland Kitchen LISINOPRIL PO   Oral   Take 1 tablet by mouth daily.         Marland Kitchen METFORMIN HCL 1000 MG PO TABS   Oral   Take 1,000 mg by mouth 2 (two) times daily with a meal.           . METOPROLOL TARTRATE 50 MG PO TABS   Oral   Take 50 mg by mouth 2 (two) times daily.         Marland Kitchen NAPROXEN 500 MG PO TABS   Oral   Take 500 mg by mouth daily.         Marland Kitchen OMEPRAZOLE 20 MG PO CPDR   Oral   Take 20 mg by mouth every morning.           BP 147/101  Pulse 103  Temp 98.9 F (37.2 C)  Resp 20  SpO2 97%  Physical Exam  Constitutional: He is oriented  to person, place, and time. He appears well-developed.  HENT:  Head: Normocephalic and atraumatic.  Eyes: Conjunctivae normal and EOM are normal. No scleral icterus.  Neck: Neck supple. No thyromegaly present.  Cardiovascular: Normal rate and regular rhythm.  Exam reveals no gallop and no friction rub.   No murmur heard. Pulmonary/Chest: No stridor. He has no wheezes. He has no rales. He exhibits no tenderness.  Abdominal: He exhibits no distension. There is tenderness. There is no rebound.  Musculoskeletal: Normal range of motion. He exhibits no edema.  Lymphadenopathy:    He has no cervical adenopathy.  Neurological: He is oriented to person, place, and time. Coordination normal.  Skin: No rash noted. No erythema.  Psychiatric: He has a normal mood and affect. His behavior is normal.    ED Course  Procedures (including critical care time)  Labs Reviewed  GLUCOSE,  CAPILLARY - Abnormal; Notable for the following:    Glucose-Capillary 156 (*)     All other components within normal limits  COMPREHENSIVE METABOLIC PANEL - Abnormal; Notable for the following:    Potassium 5.5 (*)  HEMOLYSIS AT THIS LEVEL MAY AFFECT RESULT   Chloride 95 (*)     Glucose, Bld 135 (*)     Total Protein 8.8 (*)     All other components within normal limits  LIPASE, BLOOD - Abnormal; Notable for the following:    Lipase 149 (*)     All other components within normal limits  CBC WITH DIFFERENTIAL  POCT I-STAT TROPONIN I  BASIC METABOLIC PANEL   Dg Chest 2 View  06/26/2012  *RADIOLOGY REPORT*  Clinical Data: 42 year old male with chest pain and shortness of breath.  CHEST - 2 VIEW  Comparison: 02/07/2012 and prior chest radiographs  Findings: The cardiomediastinal silhouette is unremarkable. The lungs are clear. There is no evidence of focal airspace disease, pulmonary edema, suspicious pulmonary nodule/mass, pleural effusion, or pneumothorax. No acute bony abnormalities are identified.  IMPRESSION: No evidence of active cardiopulmonary disease.   Original Report Authenticated By: Margarette Canada, M.D.      1. Pancreatitis     Date: 06/26/2012  Rate: 101  Rhythm: sinus tachycardia  QRS Axis: normal  Intervals: normal  ST/T Wave abnormalities: normal  Conduction Disutrbances:none  Narrative Interpretation:   Old EKG Reviewed: none available     MDM          Maudry Diego, MD 06/26/12 1826

## 2012-06-26 NOTE — H&P (Addendum)
[facility] History and Physical  Christopher Davila [id number] DOB: [birth year] DOA: 06/26/2012  Referring physician: Dr Roderic Palau.  PCP: No Pcp Per Pt  Specialists: none  Chief Complaint: " Rib Pain "  HPI: Tru Rover is a 42 y.o. male with PMH significant for hypertension, diabetes who presents complaining of rib pain. The pain is really in the epigastric area, right upper quadrant. Pain is sharp in quality, constant. The pain started 1 week prior to admission. Pain is accompanied by nausea, no vomiting. Food and movement makes pain worse. Last time he had a drink of alcohol was for new year eve.   He also relates unintentional weight loss for last 4 months, around 40 pounds. He also relates night sweat. No cough.  He stared to take again his medications 4 months ago.  He relates constipation.   Review of Systems: The patient denies anorexia, fever, , vision loss, decreased hearing, hoarseness, , syncope, dyspnea on exertion, peripheral edema, balance deficits, hemoptysis,  melena, hematochezia,  hematuria, incontinence, genital sores, muscle weakness, suspicious skin lesions, transient blindness, difficulty walking, depression,  abnormal bleeding, enlarged lymph nodes,   Past Medical History  Diagnosis Date  . Diabetes mellitus   . Hypertension   . Neuropathy of lower extremity    History reviewed. No pertinent past surgical history. Social History:  reports that he has never smoked. He does not have any smokeless tobacco history on file. He reports that he drinks alcohol. He reports that he does not use illicit drugs.   No Known Allergies  Family History  Problem Relation Age of Onset  . Diabetes Mother   . Diabetes Maternal Aunt     Prior to Admission medications   Medication Sig Start Date End Date Taking? Authorizing Provider  gabapentin (NEURONTIN) 300 MG capsule Take 300 mg by mouth 3 (three) times daily.   Yes Historical Provider, MD  glipiZIDE (GLUCOTROL) 10 MG  tablet Take 10 mg by mouth daily.   Yes Historical Provider, MD  LISINOPRIL PO Take 1 tablet by mouth daily.   Yes Historical Provider, MD  metFORMIN (GLUCOPHAGE) 1000 MG tablet Take 1,000 mg by mouth 2 (two) times daily with a meal.     Yes Historical Provider, MD  metoprolol (LOPRESSOR) 50 MG tablet Take 50 mg by mouth 2 (two) times daily.   Yes Historical Provider, MD  naproxen (NAPROSYN) 500 MG tablet Take 500 mg by mouth daily.   Yes Historical Provider, MD  omeprazole (PRILOSEC) 20 MG capsule Take 20 mg by mouth every morning.   Yes Historical Provider, MD   Physical Exam: Filed Vitals:   06/26/12 1309 06/26/12 1544 06/26/12 1831  BP: 142/97 147/101 155/100  Pulse: 107 103 93  Temp: 98.9 F (37.2 C)    Resp: 18 20 14   SpO2: 100% 97% 97%    General Appearance:    Alert, cooperative, no distress, appears stated age  Head:    Normocephalic, without obvious abnormality, atraumatic  Eyes:    PERRL, conjunctiva/corneas clear, EOM's intact,          Ears:    Normal TM's and external ear canals, both ears  Nose:   Nares normal, septum midline, mucosa normal, no drainage    or sinus tenderness  Throat:   Lips, mucosa, normal,  tongue with white secretion; poor dentition.  Neck:   Supple, symmetrical, trachea midline, no adenopathy;       thyroid:  No enlargement/tenderness/nodules; no carotid   bruit or JVD  Back:     Symmetric, no curvature, ROM normal, no CVA tenderness  Lungs:     Clear to auscultation bilaterally, respirations unlabored  Chest wall:    No tenderness or deformity  Heart:    Regular rate and rhythm, S1 and S2 normal, no murmur, rub   or gallop  Abdomen:     Soft, Epigastric and right upper quadrant tenderness, bowel sounds active all four quadrants, no masses, no organomegaly        Extremities:   Extremities normal, atraumatic, no cyanosis or edema  Pulses:   2+ and symmetric all extremities  Skin:   Skin color, texture, turgor normal, no rashes or lesions    Lymph nodes:   Cervical, supraclavicular, and axillary nodes normal  Neurologic:   CNII-XII intact. Normal strength, sensation and reflexes      throughout    Labs on Admission:  Basic Metabolic Panel:  Lab [id number]  NA 135  K 5.5*  CL 95*  CO2 28  GLUCOSE 135*  BUN 10  CREATININE 0.59  CALCIUM 9.7  MG --  PHOS --   Liver Function Tests:  Lab 06/26/12 1538  AST 26  ALT 10  ALKPHOS 76  BILITOT 0.6  PROT 8.8*  ALBUMIN 4.5    Lab 06/26/12 1538  LIPASE 149*  AMYLASE --   CBC:  Lab 06/26/12 1538  WBC 8.8  NEUTROABS 4.8  HGB 15.6  HCT 43.7  MCV 81.5  PLT PLATELETS APPEAR ADEQUATE   Cardiac Enzymes: No results found for this basename: CKTOTAL:5,CKMB:5,CKMBINDEX:5,TROPONINI:5 in the last 168 hours  BNP (last 3 results) No results found for this basename: PROBNP:3 in the last 8760 hours CBG:  Lab 06/26/12 1452  GLUCAP 156*    Radiological Exams on Admission: Dg Chest 2 View  06/26/2012  *RADIOLOGY REPORT*  Clinical Data: 42 year old male with chest pain and shortness of breath.  CHEST - 2 VIEW  Comparison: 02/07/2012 and prior chest radiographs  Findings: The cardiomediastinal silhouette is unremarkable. The lungs are clear. There is no evidence of focal airspace disease, pulmonary edema, suspicious pulmonary nodule/mass, pleural effusion, or pneumothorax. No acute bony abnormalities are identified.  IMPRESSION: No evidence of active cardiopulmonary disease.   Original Report Authenticated By: Margarette Canada, M.D.     EKG: Independently reviewed. Sinus tachycardia.  Assessment/Plan Active Problems:  * No active hospital problems. *   1-Pancreatitis: patient presents with abdominal pain, mild increase lipase. Will start iv fluids, iv morphine, IV protonix. NPO status. Will check triglycerides, alcohol level. RUQ [place], he has RUQ pain on palpation. Patient denies any recent alcohol use.  2-Hypertension: Continue with metoprolol. Start PRN hydralazine.   3-Weight loss, night sweat: Highly in the differential HIV. I will also check TSH. Further work up depending test results.  4-Diabetes: Hold oral hypoglycemic. Start SSI. 5-Alcohol use: Thiamine and Folate.  6-Oral candidiasis; oral nystatin.   Code Status: Full Code.  Family Communication: Plan of care discussed with the patient. Disposition Plan: Expect 3-4 days inpatient admission, home when stable.  Time spent: more than 70 minutes.   REGALADO,BELKYS [facility] Pager [phone]  If 7PM-7AM, please contact night-coverage [email or web address] Password [facility] 06/26/2012, 7:16 PM

## 2012-06-27 LAB — GLUCOSE, CAPILLARY
Glucose-Capillary: 110 mg/dL — ABNORMAL HIGH (ref 70–99)
Glucose-Capillary: 110 mg/dL — ABNORMAL HIGH (ref 70–99)
Glucose-Capillary: 216 mg/dL — ABNORMAL HIGH (ref 70–99)

## 2012-06-27 LAB — COMPREHENSIVE METABOLIC PANEL
CO2: 25 mEq/L (ref 19–32)
Calcium: 8.9 mg/dL (ref 8.4–10.5)
Creatinine, Ser: 0.53 mg/dL (ref 0.50–1.35)
GFR calc Af Amer: >90 mL/min (ref >90)
GFR calc non Af Amer: >90 mL/min (ref >90)
Glucose, Bld: 121 mg/dL — ABNORMAL HIGH (ref 70–99)

## 2012-06-27 LAB — CBC
Hemoglobin: 12.9 g/dL — ABNORMAL LOW (ref 13.0–17.0)
RBC: 4.66 MIL/uL (ref 4.22–5.81)
WBC: 7.9 10*3/uL (ref 4.0–10.5)

## 2012-06-27 LAB — HIV ANTIBODY (ROUTINE TESTING W REFLEX): HIV: NONREACTIVE

## 2012-06-27 LAB — TSH: TSH: 1.03 u[IU]/mL (ref 0.350–4.500)

## 2012-06-27 MED ORDER — LISINOPRIL 10 MG PO TABS
10.0000 mg | ORAL_TABLET | Freq: Every day | ORAL | Status: DC
  Administered 2012-06-27 – 2012-06-30 (×4): 10 mg via ORAL
  Filled 2012-06-27 (×4): qty 1

## 2012-06-27 MED ORDER — GABAPENTIN 300 MG PO CAPS
300.0000 mg | ORAL_CAPSULE | Freq: Three times a day (TID) | ORAL | Status: DC
  Administered 2012-06-27 – 2012-06-30 (×11): 300 mg via ORAL
  Filled 2012-06-27 (×13): qty 1

## 2012-06-27 NOTE — Progress Notes (Signed)
[facility] PROGRESS NOTE  Christopher Davila [id number] DOB: 06/24/1970 DOA: 06/26/2012 PCP: No Pcp Per Pt  HPI/Subjective: Still has some pain, ultrasound is negative for cholelithiasis   Assessment/Plan:  Acute pancreatitis -Likely secondary to alcohol consumption, ultrasound is negative for cholelithiasis. -Lipase is 149 yesterday. -Treat with bowel rest, IV fluid and Pain medications. -Check lipase in a.m., clear liquids if patient wants to eat  Alcohol consumption -Patient counseled extensively, he is alert he quit a week ago.  Hypertension -Continue home medications.  Diabetes mellitus -Patient is on glipizide and metformin, hold while he is n.p.o. -Check hemoglobin A1c, as he has oral thrush and weight loss  Code Status: Full Family Communication:  Disposition Plan: Remains inpatient   Consultants:  None  Procedures:  None*  Antibiotics: None  Objective: Filed Vitals:   06/26/12 1831 06/26/12 2142 06/27/12 0012 06/27/12 0526  BP: 155/100 179/113 143/93 146/94  Pulse: 93 99 80 78  Temp:  98.4 F (36.9 C) 97.6 F (36.4 C) 97 F (36.1 C)  TempSrc:    Axillary  Resp: 14 19 18 20   Height:  5\' 3"  (1.6 m)    Weight:  54.1 kg (119 lb 4.3 oz)  53.7 kg (118 lb 6.2 oz)  SpO2: 97% 99% 98% 98%    Intake/Output Summary (Last 24 hours) at 06/27/12 1506 Last data filed at 06/27/12 0800  Gross per 24 hour  Intake   1100 ml  Output      0 ml  Net   1100 ml   Filed Weights   06/26/12 2142 06/27/12 0526  Weight: 54.1 kg (119 lb 4.3 oz) 53.7 kg (118 lb 6.2 oz)    Exam:  General: Alert and awake, oriented x3, not in any acute distress. HEENT: anicteric sclera, pupils reactive to light and accommodation, EOMI CVS: S1-S2 clear, no murmur rubs or gallops Chest: clear to auscultation bilaterally, no wheezing, rales or rhonchi Abdomen: soft nontender, nondistended, normal bowel sounds, no organomegaly Extremities: no cyanosis, clubbing or edema noted  bilaterally  Neuro: Cranial nerves II-XII intact, no focal neurological deficits  Data Reviewed: Basic Metabolic Panel:  Lab [id number] 06/26/12 2149 06/26/12 1835 06/26/12 1538  NA 137 -- 138 135  K 4.1 -- 3.7 5.5*  CL 100 -- 100 95*  CO2 25 -- 27 28  GLUCOSE 121* -- 114* 135*  BUN 6 -- 9 10  CREATININE 0.53 0.62 0.66 0.59  CALCIUM 8.9 -- 9.3 9.7  MG -- -- -- --  PHOS -- -- -- --   Liver Function Tests:  Lab 06/27/12 0536 06/26/12 1538  AST 22 26  ALT 10 10  ALKPHOS 65 76  BILITOT 0.5 0.6  PROT 7.2 8.8*  ALBUMIN 3.7 4.5    Lab 06/26/12 1538  LIPASE 149*  AMYLASE --   No results found for this basename: AMMONIA:5 in the last 168 hours CBC:  Lab 06/27/12 0536 06/26/12 2149 06/26/12 1538  WBC 7.9 9.3 8.8  NEUTROABS -- -- 4.8  HGB 12.9* 13.6 15.6  HCT 38.6* 40.2 43.7  MCV 82.8 82.9 81.5  PLT 192 196 PLATELETS APPEAR ADEQUATE   Cardiac Enzymes: No results found for this basename: CKTOTAL:5,CKMB:5,CKMBINDEX:5,TROPONINI:5 in the last 168 hours BNP (last 3 results) No results found for this basename: PROBNP:3 in the last 8760 hours CBG:  Lab 06/27/12 1152 06/27/12 0738 06/26/12 2217 06/26/12 2019 06/26/12 1452  GLUCAP 110* 110* 171* 91 156*    No results found for this or any previous visit (  from the past 240 hour(s)).   Studies: Dg Chest 2 View  06/26/2012  *RADIOLOGY REPORT*  Clinical Data: 42 year old male with chest pain and shortness of breath.  CHEST - 2 VIEW  Comparison: 02/07/2012 and prior chest radiographs  Findings: The cardiomediastinal silhouette is unremarkable. The lungs are clear. There is no evidence of focal airspace disease, pulmonary edema, suspicious pulmonary nodule/mass, pleural effusion, or pneumothorax. No acute bony abnormalities are identified.  IMPRESSION: No evidence of active cardiopulmonary disease.   Original Report Authenticated By: Margarette Canada, M.D.    US Abdomen Complete  06/26/2012  *RADIOLOGY REPORT*  Clinical Data:  Right  upper quadrant pain.  Elevated lipase  ABDOMINAL ULTRASOUND COMPLETE  Comparison:  None.  Findings:  Gallbladder:  No gallstones, gallbladder wall thickening, or pericholecystic fluid.  Common Bile Duct:  Within normal limits in caliber.  Liver: No focal mass lesion identified.  Within normal limits in parenchymal echogenicity.  IVC:  Appears normal.  Pancreas:  Exam detail diminish due to bowel gas.  Spleen:  Within normal limits in size and echotexture.  Right kidney:  Normal in size and parenchymal echogenicity.  No evidence of mass or hydronephrosis.  Left kidney:  Normal in size and parenchymal echogenicity.  No evidence of mass or hydronephrosis. The cyst measures 1.3 x 1 x 1.1 cm.  Abdominal Aorta:  No aneurysm identified.  IMPRESSION:  1.  No acute findings identified. 2.  Nonvisualization of the pancreas secondary to overlying bowel gas.   Original Report Authenticated By: Kerby Moors, M.D.     Scheduled Meds:   . sodium chloride   Intravenous STAT  . enoxaparin (LOVENOX) injection  40 mg Subcutaneous Q24H  . folic acid  1 mg Intravenous Daily  . gabapentin  300 mg Oral TID  . insulin aspart  0-9 Units Subcutaneous TID WC  . metoprolol  50 mg Oral BID  . nystatin  5 mL Oral TID AC & HS  . pantoprazole (PROTONIX) IV  40 mg Intravenous Q12H  . sodium chloride  3 mL Intravenous Q12H  . thiamine  100 mg Intravenous Daily   Continuous Infusions:   . sodium chloride 125 mL/hr at 06/27/12 1345    Active Problems:  Diabetes  Hypertension  Pancreatitis  Weight loss  Oral candidiasis    Time spent: 35 minutes    [place] Pager [phone]. If 8PM-8AM, please contact night-coverage at [email or web address], password [facility] 06/27/2012, 3:06 PM  LOS: 1 day

## 2012-06-27 NOTE — Progress Notes (Signed)
INITIAL NUTRITION ASSESSMENT  DOCUMENTATION CODES Per approved criteria  -Not Applicable   INTERVENTION: 1.  Modify diet; per MD discretion based on pt readiness and resolve of pancreatitis.  Recommend advancement to Low Fat goal. 2.  Brief education; provided education materials to pt re: Low fat nutrition therapy and diabetes management.  Pt prefers to review on his own at this time.    NUTRITION DIAGNOSIS: Inadequate oral intake related to omission of energy dense foods as evidenced by pt NPO due to pancreatitis.    Monitor:  1.  Food/Beverage; diet advancement with tolerance once medically appropriate. 2.  Wt/wt change; deter loss, monitor trends. 3.  Blood glucose; maintain 80-120 mg/dL  Reason for Assessment: MST  42 y.o. male  Admitting Dx: rib pain  ASSESSMENT: Pt with h/o of DM admitted for rib pain and found to have mild pancreatitis.  Pt remains NPO due to elevated lipase. Lipase     Component Value Date/Time   LIPASE 149* 06/26/2012 1538   Pt with h/o of DM, on oral medications only on admission. Lab Results  Component Value Date   HGBA1C  Value: 9.4 (NOTE)   The ADA recommends the following therapeutic goal for glycemic   control related to Hgb A1C measurement:   Goal of Therapy:   < 7.0% Hgb A1C   Reference: American Diabetes Association: Clinical Practice   Recommendations 2008, Diabetes Care,  2008, 31:(Suppl 1).* 12/21/2007   Pt with concerning wt hx; reports losing 40 lbs in 4 months (27% of usual wt) which is clinically severe.  Pt states he was out of diabetes medications for approximately 6 months and recently started them back in October.  He states he was without medication once in the past and dropped significant wt at that time as well.  He reports he was able to gain wt back quickly which has not happened this time due to feeling bloated with meals making it difficult to complete meals.  He feels he has been able to eat well and slow wt loss, just not  gaining wt back.   Pt states he is interested in learning more about self care as he recently had a 1 y/o cousin die of DM-related complications.  Height: Ht Readings from Last 1 Encounters:  06/26/12 5\' 3"  (1.6 m)    Weight: Wt Readings from Last 1 Encounters:  06/27/12 118 lb 6.2 oz (53.7 kg)    Ideal Body Weight: 124 lbs  % Ideal Body Weight: 95%  Wt Readings from Last 10 Encounters:  06/27/12 118 lb 6.2 oz (53.7 kg)  12/12/11 160 lb (72.576 kg)    Usual Body Weight: 160 lbs per pt report and chart review  % Usual Body Weight: 73%  BMI:  Body mass index is 20.97 kg/(m^2).  Estimated Nutritional Needs: Kcal: 1700-1900 Protein: 65-76g Fluid: >1.8 L/day  Skin: intact  Diet Order: NPO  EDUCATION NEEDS: -Education needs addressed   Intake/Output Summary (Last 24 hours) at 06/27/12 0959 Last data filed at 06/27/12 0800  Gross per 24 hour  Intake   1100 ml  Output      0 ml  Net   1100 ml    Last BM: 1/8  Labs:   Lab 06/27/12 0536 06/26/12 2149 06/26/12 1835 06/26/12 1538  NA 137 -- 138 135  K 4.1 -- 3.7 5.5*  CL 100 -- 100 95*  CO2 25 -- 27 28  BUN 6 -- 9 10  CREATININE 0.53 0.62 0.66 --  CALCIUM 8.9 -- 9.3 9.7  MG -- -- -- --  PHOS -- -- -- --  GLUCOSE 121* -- 114* 135*    CBG (last 3)   Basename 06/27/12 0738 06/26/12 2217 06/26/12 2019  GLUCAP 110* 171* 91    Scheduled Meds:   . sodium chloride   Intravenous STAT  . enoxaparin (LOVENOX) injection  40 mg Subcutaneous Q24H  . folic acid  1 mg Intravenous Daily  . gabapentin  300 mg Oral TID  . influenza  inactive virus vaccine  0.5 mL Intramuscular Tomorrow-1000  . insulin aspart  0-9 Units Subcutaneous TID WC  . metoprolol  50 mg Oral BID  . nystatin  5 mL Oral TID AC & HS  . pantoprazole (PROTONIX) IV  40 mg Intravenous Q12H  . sodium chloride  3 mL Intravenous Q12H  . thiamine  100 mg Intravenous Daily    Continuous Infusions:   . sodium chloride 125 mL/hr at 06/26/12 2312     Past Medical History  Diagnosis Date  . Diabetes mellitus   . Hypertension   . Neuropathy of lower extremity     History reviewed. No pertinent past surgical history.  Brynda Greathouse, MS RD LDN Clinical Inpatient Dietitian Pager: [phone] Weekend/After hours pager: [phone]

## 2012-06-27 NOTE — Progress Notes (Signed)
Pt admitted to the unit. Pt is alert and oriented. Pt oriented to room, staff, and call bell. Bed in lowest position. Full assessment to Epic. Call bell with in reach. Told to call for assists. Will continue to monitor.  Keeana Pieratt E  

## 2012-06-28 LAB — BASIC METABOLIC PANEL
BUN: 4 mg/dL — ABNORMAL LOW (ref 6–23)
Calcium: 9.2 mg/dL (ref 8.4–10.5)
GFR calc non Af Amer: >90 mL/min (ref >90)
Glucose, Bld: 120 mg/dL — ABNORMAL HIGH (ref 70–99)
Potassium: 3.5 mEq/L (ref 3.5–5.1)

## 2012-06-28 LAB — GLUCOSE, CAPILLARY
Glucose-Capillary: 118 mg/dL — ABNORMAL HIGH (ref 70–99)
Glucose-Capillary: 153 mg/dL — ABNORMAL HIGH (ref 70–99)

## 2012-06-28 LAB — CBC
Hemoglobin: 12.8 g/dL — ABNORMAL LOW (ref 13.0–17.0)
MCH: 27.7 pg (ref 26.0–34.0)
MCHC: 33.7 g/dL (ref 30.0–36.0)

## 2012-06-28 LAB — LIPASE, BLOOD: Lipase: 88 U/L — ABNORMAL HIGH (ref 11–59)

## 2012-06-28 NOTE — Progress Notes (Signed)
[facility] PROGRESS NOTE  Christopher Davila [id number] DOB: September 01, 1970 DOA: 06/26/2012 PCP: No Pcp Per Pt  HPI/Subjective: Reported 8/10 epigastric abdominal pain   Assessment/Plan:  Acute pancreatitis -Likely secondary to alcohol consumption, ultrasound is negative for cholelithiasis. -Lipase is 149 yesterday, today is 88. -Treat with bowel rest, IV fluid and Pain medications. -Check lipase in a.m., started on clear liquids yesterday continued.  Alcohol consumption -Patient counseled extensively, he said he quit a week ago.  Hypertension -Continue home medications.  Diabetes mellitus -Patient is on glipizide and metformin, hold while he is n.p.o. -A1c is 7.6, as he has oral thrush and weight loss  Weight loss -Patient has about 20-30 pound weight loss over the past 6 months. -He is also oral thrush, he tested negative for HIV. -I think his weight loss and his oral thrush is likely secondary to uncontrolled diabetes mellitus.  Code Status: Full Family Communication:  Disposition Plan: Remains inpatient   Consultants:  None  Procedures:  None*  Antibiotics: None  Objective: Filed Vitals:   06/27/12 0526 06/27/12 1400 06/27/12 2111 06/28/12 0550  BP: 146/94 165/120 158/109 153/99  Pulse: 78 82 82 78  Temp: 97 F (36.1 C) 98.5 F (36.9 C) 98.2 F (36.8 C) 98.1 F (36.7 C)  TempSrc: Axillary Oral Oral Oral  Resp: 20 20 18 20   Height:      Weight: 53.7 kg (118 lb 6.2 oz)   53.1 kg (117 lb 1 oz)  SpO2: 98% 97% 98% 97%    Intake/Output Summary (Last 24 hours) at 06/28/12 1022 Last data filed at 06/28/12 0949  Gross per 24 hour  Intake 4022.5 ml  Output      0 ml  Net 4022.5 ml   Filed Weights   06/26/12 2142 06/27/12 0526 06/28/12 0550  Weight: 54.1 kg (119 lb 4.3 oz) 53.7 kg (118 lb 6.2 oz) 53.1 kg (117 lb 1 oz)    Exam:  General: Alert and awake, oriented x3, not in any acute distress. HEENT: anicteric sclera, pupils reactive to light  and accommodation, EOMI CVS: S1-S2 clear, no murmur rubs or gallops Chest: clear to auscultation bilaterally, no wheezing, rales or rhonchi Abdomen: soft nontender, nondistended, normal bowel sounds, no organomegaly Extremities: no cyanosis, clubbing or edema noted bilaterally  Neuro: Cranial nerves II-XII intact, no focal neurological deficits  Data Reviewed: Basic Metabolic Panel:  Lab [id number] 0717 06/27/12 0536 06/26/12 2149 06/26/12 1835 06/26/12 1538  NA 136 137 -- 138 135  K 3.5 4.1 -- 3.7 5.5*  CL 99 100 -- 100 95*  CO2 25 25 -- 27 28  GLUCOSE 120* 121* -- 114* 135*  BUN 4* 6 -- 9 10  CREATININE 0.60 0.53 0.62 0.66 0.59  CALCIUM 9.2 8.9 -- 9.3 9.7  MG -- -- -- -- --  PHOS -- -- -- -- --   Liver Function Tests:  Lab 06/27/12 0536 06/26/12 1538  AST 22 26  ALT 10 10  ALKPHOS 65 76  BILITOT 0.5 0.6  PROT 7.2 8.8*  ALBUMIN 3.7 4.5    Lab 06/28/12 0717 06/26/12 1538  LIPASE 88* 149*  AMYLASE -- --   No results found for this basename: AMMONIA:5 in the last 168 hours CBC:  Lab 06/28/12 0717 06/27/12 0536 06/26/12 2149 06/26/12 1538  WBC 6.9 7.9 9.3 8.8  NEUTROABS -- -- -- 4.8  HGB 12.8* 12.9* 13.6 15.6  HCT 38.0* 38.6* 40.2 43.7  MCV 82.3 82.8 82.9 81.5  PLT 183 192 196 PLATELETS  APPEAR ADEQUATE   Cardiac Enzymes: No results found for this basename: CKTOTAL:5,CKMB:5,CKMBINDEX:5,TROPONINI:5 in the last 168 hours BNP (last 3 results) No results found for this basename: PROBNP:3 in the last 8760 hours CBG:  Lab 06/28/12 0748 06/27/12 2109 06/27/12 1705 06/27/12 1152 06/27/12 0738  GLUCAP 89 216* 88 110* 110*    No results found for this or any previous visit (from the past 240 hour(s)).   Studies: Dg Chest 2 View  06/26/2012  *RADIOLOGY REPORT*  Clinical Data: 42 year old male with chest pain and shortness of breath.  CHEST - 2 VIEW  Comparison: 02/07/2012 and prior chest radiographs  Findings: The cardiomediastinal silhouette is unremarkable. The lungs  are clear. There is no evidence of focal airspace disease, pulmonary edema, suspicious pulmonary nodule/mass, pleural effusion, or pneumothorax. No acute bony abnormalities are identified.  IMPRESSION: No evidence of active cardiopulmonary disease.   Original Report Authenticated By: Margarette Canada, M.D.    US Abdomen Complete  06/26/2012  *RADIOLOGY REPORT*  Clinical Data:  Right upper quadrant pain.  Elevated lipase  ABDOMINAL ULTRASOUND COMPLETE  Comparison:  None.  Findings:  Gallbladder:  No gallstones, gallbladder wall thickening, or pericholecystic fluid.  Common Bile Duct:  Within normal limits in caliber.  Liver: No focal mass lesion identified.  Within normal limits in parenchymal echogenicity.  IVC:  Appears normal.  Pancreas:  Exam detail diminish due to bowel gas.  Spleen:  Within normal limits in size and echotexture.  Right kidney:  Normal in size and parenchymal echogenicity.  No evidence of mass or hydronephrosis.  Left kidney:  Normal in size and parenchymal echogenicity.  No evidence of mass or hydronephrosis. The cyst measures 1.3 x 1 x 1.1 cm.  Abdominal Aorta:  No aneurysm identified.  IMPRESSION:  1.  No acute findings identified. 2.  Nonvisualization of the pancreas secondary to overlying bowel gas.   Original Report Authenticated By: Kerby Moors, M.D.     Scheduled Meds:    . enoxaparin (LOVENOX) injection  40 mg Subcutaneous Q24H  . folic acid  1 mg Intravenous Daily  . gabapentin  300 mg Oral TID  . insulin aspart  0-9 Units Subcutaneous TID WC  . lisinopril  10 mg Oral Daily  . metoprolol  50 mg Oral BID  . nystatin  5 mL Oral TID AC & HS  . pantoprazole (PROTONIX) IV  40 mg Intravenous Q12H  . sodium chloride  3 mL Intravenous Q12H  . thiamine  100 mg Intravenous Daily   Continuous Infusions:    . sodium chloride 125 mL/hr at 06/28/12 0537    Active Problems:  Diabetes  Hypertension  Pancreatitis  Weight loss  Oral candidiasis    Time spent: 35  minutes    [place] [facility] Pager [phone]. If 8PM-8AM, please contact night-coverage at [email or web address], password [facility] 06/28/2012, 10:22 AM  LOS: 2 days

## 2012-06-29 LAB — COMPREHENSIVE METABOLIC PANEL
ALT: 19 U/L (ref 0–53)
AST: 18 U/L (ref 0–37)
Albumin: 3.8 g/dL (ref 3.5–5.2)
CO2: 26 mEq/L (ref 19–32)
Calcium: 9.5 mg/dL (ref 8.4–10.5)
GFR calc non Af Amer: >90 mL/min (ref >90)
Sodium: 139 mEq/L (ref 135–145)
Total Protein: 7.5 g/dL (ref 6.0–8.3)

## 2012-06-29 LAB — GLUCOSE, CAPILLARY
Glucose-Capillary: 143 mg/dL — ABNORMAL HIGH (ref 70–99)
Glucose-Capillary: 150 mg/dL — ABNORMAL HIGH (ref 70–99)

## 2012-06-29 LAB — LIPASE, BLOOD: Lipase: 78 U/L — ABNORMAL HIGH (ref 11–59)

## 2012-06-29 MED ORDER — POLYETHYLENE GLYCOL 3350 17 G PO PACK
17.0000 g | PACK | Freq: Every day | ORAL | Status: DC
  Administered 2012-06-29 – 2012-06-30 (×2): 17 g via ORAL
  Filled 2012-06-29 (×2): qty 1

## 2012-06-29 MED ORDER — VITAMIN B-1 100 MG PO TABS
100.0000 mg | ORAL_TABLET | Freq: Every day | ORAL | Status: DC
  Administered 2012-06-29 – 2012-06-30 (×2): 100 mg via ORAL
  Filled 2012-06-29 (×2): qty 1

## 2012-06-29 NOTE — Plan of Care (Signed)
Problem: Phase III Progression Outcomes Goal: Discharge plan remains appropriate-arrangements made Outcome: Completed/Met Date Met:  06/29/12 To return home

## 2012-06-29 NOTE — Progress Notes (Signed)
[facility] PROGRESS NOTE  Christopher Davila [id number] DOB: June 07, 1971 DOA: 06/26/2012 PCP: No Pcp Per Pt  HPI/Subjective: Pain is improved 4-5/10   Assessment/Plan:  Acute pancreatitis -Likely secondary to alcohol consumption, ultrasound is negative for cholelithiasis. -Lipase is 149 yesterday, today is 78. -Treat with bowel rest, IV fluid and Pain medications. -Full liquid diet, if tolerated maybe we'll discharge by the end of the day or tomorrow morning.  Alcohol consumption -Patient counseled extensively, he said he quit a week ago.  Hypertension -Continue home medications.  Diabetes mellitus -Patient is on glipizide and metformin, hold while he is n.p.o. -A1c is 7.6, as he has oral thrush and weight loss  Weight loss -Patient has about 20-30 pound weight loss over the past 6 months. -He is also oral thrush, he tested negative for HIV. -I think his weight loss and his oral thrush is likely secondary to uncontrolled diabetes mellitus.  Code Status: Full Family Communication:  Disposition Plan: Remains inpatient   Consultants:  None  Procedures:  None  Antibiotics:  None  Objective: Filed Vitals:   06/28/12 0550 06/28/12 1338 06/28/12 2100 06/29/12 0433  BP: 153/99 143/92 148/107 153/97  Pulse: 78 73 85 71  Temp: 98.1 F (36.7 C) 98.7 F (37.1 C) 97.9 F (36.6 C) 98.1 F (36.7 C)  TempSrc: Oral  Oral Oral  Resp: 20 18 20 16   Height:      Weight: 53.1 kg (117 lb 1 oz)   53.1 kg (117 lb 1 oz)  SpO2: 97% 99% 96% 98%    Intake/Output Summary (Last 24 hours) at 06/29/12 1040 Last data filed at 06/29/12 0800  Gross per 24 hour  Intake 4900.83 ml  Output      0 ml  Net 4900.83 ml   Filed Weights   06/27/12 0526 06/28/12 0550 06/29/12 0433  Weight: 53.7 kg (118 lb 6.2 oz) 53.1 kg (117 lb 1 oz) 53.1 kg (117 lb 1 oz)    Exam:  General: Alert and awake, oriented x3, not in any acute distress. HEENT: anicteric sclera, pupils reactive to  light and accommodation, EOMI CVS: S1-S2 clear, no murmur rubs or gallops Chest: clear to auscultation bilaterally, no wheezing, rales or rhonchi Abdomen: soft nontender, nondistended, normal bowel sounds, no organomegaly Extremities: no cyanosis, clubbing or edema noted bilaterally  Neuro: Cranial nerves II-XII intact, no focal neurological deficits  Data Reviewed: Basic Metabolic Panel:  Lab [id number] 0630 06/28/12 0717 06/27/12 0536 06/26/12 2149 06/26/12 1835 06/26/12 1538  NA 139 136 137 -- 138 135  K 3.6 3.5 4.1 -- 3.7 5.5*  CL 100 99 100 -- 100 95*  CO2 26 25 25  -- 27 28  GLUCOSE 110* 120* 121* -- 114* 135*  BUN 4* 4* 6 -- 9 10  CREATININE 0.63 0.60 0.53 0.62 0.66 --  CALCIUM 9.5 9.2 8.9 -- 9.3 9.7  MG -- -- -- -- -- --  PHOS -- -- -- -- -- --   Liver Function Tests:  Lab 06/29/12 0630 06/27/12 0536 06/26/12 1538  AST 18 22 26   ALT 19 10 10   ALKPHOS 70 65 76  BILITOT 0.4 0.5 0.6  PROT 7.5 7.2 8.8*  ALBUMIN 3.8 3.7 4.5    Lab 06/29/12 0630 06/28/12 0717 06/26/12 1538  LIPASE 78* 88* 149*  AMYLASE -- -- --   No results found for this basename: AMMONIA:5 in the last 168 hours CBC:  Lab 06/28/12 0717 06/27/12 0536 06/26/12 2149 06/26/12 1538  WBC 6.9 7.9 9.3  8.8  NEUTROABS -- -- -- 4.8  HGB 12.8* 12.9* 13.6 15.6  HCT 38.0* 38.6* 40.2 43.7  MCV 82.3 82.8 82.9 81.5  PLT 183 192 196 PLATELETS APPEAR ADEQUATE   Cardiac Enzymes: No results found for this basename: CKTOTAL:5,CKMB:5,CKMBINDEX:5,TROPONINI:5 in the last 168 hours BNP (last 3 results) No results found for this basename: PROBNP:3 in the last 8760 hours CBG:  Lab 06/29/12 0741 06/28/12 2058 06/28/12 1700 06/28/12 1204 06/28/12 0748  GLUCAP 109* 103* 153* 118* 89    No results found for this or any previous visit (from the past 240 hour(s)).   Studies: No results found.  Scheduled Meds:    . enoxaparin (LOVENOX) injection  40 mg Subcutaneous Q24H  . folic acid  1 mg Intravenous Daily  .  gabapentin  300 mg Oral TID  . insulin aspart  0-9 Units Subcutaneous TID WC  . lisinopril  10 mg Oral Daily  . metoprolol  50 mg Oral BID  . nystatin  5 mL Oral TID AC & HS  . pantoprazole (PROTONIX) IV  40 mg Intravenous Q12H  . polyethylene glycol  17 g Oral Daily  . sodium chloride  3 mL Intravenous Q12H  . thiamine  100 mg Oral Daily   Continuous Infusions:    . sodium chloride 1,000 mL (06/29/12 0933)    Active Problems:  Diabetes  Hypertension  Pancreatitis  Weight loss  Oral candidiasis    Time spent: 35 minutes    [place] [facility] Pager [phone]. If 8PM-8AM, please contact night-coverage at [email or web address], password [facility] 06/29/2012, 10:40 AM  LOS: 3 days

## 2012-06-29 NOTE — Progress Notes (Signed)
MD patient requesting medication for constipation.  Please address.  Thanks

## 2012-06-29 NOTE — Care Management Note (Addendum)
    Page 1 of 1   06/30/2012     10:27:21 AM   CARE MANAGEMENT NOTE 06/30/2012  Patient:  Christopher Davila, Christopher Davila   Account Number:  [id number]  Date Initiated:  06/29/2012  Documentation initiated by:  Tomi Bamberger  Subjective/Objective Assessment:   dx dm, mild pancreatitis  admit- lives with parent and sister.  pta independent.     Action/Plan:   Anticipated DC Date:  06/30/2012   Anticipated DC Plan:  [place]  CM consult      Choice offered to / List presented to:             Status of service:  Completed, signed off Medicare Important Message given?   (If response is "NO", the following Medicare IM given date fields will be blank) Date Medicare IM given:   Date Additional Medicare IM given:    Discharge Disposition:  HOME/SELF CARE  Per UR Regulation:  Reviewed for med. necessity/level of care/duration of stay  If discussed at [place] of Stay Meetings, dates discussed:    Comments:  06/30/12 10:25 Tomi Bamberger RN, BSN [phone] patient for dc today. No needs anticipated.   06/29/12 11:53 Tomi Bamberger RN, BSN [phone] patient lives with mother and sister, pta independent. Patient states he can afford his medications which he gets throught [facility] free.  Patient states he has a bus pass as well. Patient is set up with [facility] on 1/22 at 4 pm. No other needs.

## 2012-06-30 LAB — GLUCOSE, CAPILLARY
Glucose-Capillary: 145 mg/dL — ABNORMAL HIGH (ref 70–99)
Glucose-Capillary: 190 mg/dL — ABNORMAL HIGH (ref 70–99)

## 2012-06-30 MED ORDER — PANTOPRAZOLE SODIUM 40 MG PO TBEC
40.0000 mg | DELAYED_RELEASE_TABLET | Freq: Every day | ORAL | Status: DC
  Administered 2012-06-30: 40 mg via ORAL
  Filled 2012-06-30: qty 1

## 2012-06-30 MED ORDER — FOLIC ACID 1 MG PO TABS
1.0000 mg | ORAL_TABLET | Freq: Every day | ORAL | Status: DC
  Administered 2012-06-30: 1 mg via ORAL
  Filled 2012-06-30: qty 1

## 2012-06-30 MED ORDER — HYDROCODONE-ACETAMINOPHEN 5-325 MG PO TABS: 1.0000 | ORAL_TABLET | Freq: Three times a day (TID) | ORAL | Status: DC | PRN

## 2012-06-30 MED ORDER — LISINOPRIL 10 MG PO TABS: 10.0000 mg | ORAL_TABLET | Freq: Every day | ORAL | Status: DC

## 2012-06-30 NOTE — Discharge Summary (Signed)
Physician Discharge Summary  Christopher Davila [id number] DOB: [birth year] DOA: 06/26/2012  PCP: No Pcp Per Pt  Admit date: 06/26/2012 Discharge date: 06/30/2012  Time spent: 40 minutes  Recommendations for Outpatient Follow-up:  1. Followup with urgent care as scheduled.  Discharge Diagnoses:  Active Problems:  Diabetes  Hypertension  Pancreatitis  Weight loss  Oral candidiasis   Discharge Condition: Stable  Diet recommendation: Carbohydrate modified diet  Filed Weights   06/28/12 0550 06/29/12 0433 06/30/12 0609  Weight: 53.1 kg (117 lb 1 oz) 53.1 kg (117 lb 1 oz) 52.2 kg (115 lb 1.3 oz)    History of present illness:  Christopher Davila is a 42 y.o. male with PMH significant for hypertension, diabetes who presents complaining of rib pain. The pain is really in the epigastric area, right upper quadrant. Pain is sharp in quality, constant. The pain started 1 week prior to admission. Pain is accompanied by nausea, no vomiting. Food and movement makes pain worse. Last time he had a drink of alcohol was for new year eve.  He also relates unintentional weight loss for last 4 months, around 40 pounds. He also relates night sweat. No cough.  He stared to take again his medications 4 months ago.  He relates constipation.   Hospital Course:   1. Acute pancreatitis: Patient came in with severe epigastric abdominal pain, he thought initially he has some rib pain. This is likely secondary to alcohol consumption, ultrasound of the abdomen showed no evidence of cholelithiasis. In lipase is 149 at the time of admission, and it went down nicely to 78. Patient's symptoms was improving with the conservative management with bowel rest, pain medications on IV fluids. Diet advanced carefully and patient was able to tolerate regular diet. Patient requested pain medications, Vicodin 5/325 mg was prescribed only 20 pills given.  2. Alcohol consumption: Patient counseled extensively, he said he quit about a  week ago, he is not drinking he did not show any symptoms or signs of withdrawal while he's in the hospital.  3. Hypertension: Preadmission oral medications continued throughout the hospital stay.  4. Diabetes mellitus type 2: Patient is on glipizide and metformin, hold while he is n.p.o., he said he had problems getting his medications because of financial reasons but now he has access to do so. His hemoglobin A1c is 7.6, his medication no change.  5. Weight loss: Patient had about 20-30 pound weight loss over the past 6 months, he also had oral thrush. His tested negative for HIV. I think in his age it's probably secondary to uncontrolled diabetes, patient was saying he wasn't getting his medications and that's when he started to notice the weight loss. Patient advised to followup with his primary care physician for further evaluation of the weight loss continues.  Procedures:  None  Consultations:  None  Discharge Exam: Filed Vitals:   06/29/12 1223 06/29/12 1433 06/29/12 2135 06/30/12 0609  BP: 143/107 140/89 146/95 156/102  Pulse: 90 89 91 86  Temp:  98.3 F (36.8 C) 98.2 F (36.8 C) 98.1 F (36.7 C)  TempSrc:  Oral Oral Oral  Resp: 18 18 18 18   Height:      Weight:    52.2 kg (115 lb 1.3 oz)  SpO2: 97% 100% 100% 100%   General: Alert and awake, oriented x3, not in any acute distress. HEENT: anicteric sclera, pupils reactive to light and accommodation, EOMI CVS: S1-S2 clear, no murmur rubs or gallops Chest: clear to auscultation bilaterally, no wheezing,  rales or rhonchi Abdomen: soft nontender, nondistended, normal bowel sounds, no organomegaly Extremities: no cyanosis, clubbing or edema noted bilaterally Neuro: Cranial nerves II-XII intact, no focal neurological deficits  Discharge Instructions  Discharge Orders    Future Orders Please Complete By Expires   Diet Carb Modified      Increase activity slowly          Medication List     As of 06/30/2012 11:53 AM      TAKE these medications         gabapentin 300 MG capsule   Commonly known as: NEURONTIN   Take 300 mg by mouth 3 (three) times daily.      glipiZIDE 10 MG tablet   Commonly known as: GLUCOTROL   Take 10 mg by mouth daily.      HYDROcodone-acetaminophen 5-325 MG per tablet   Commonly known as: NORCO/VICODIN   Take 1 tablet by mouth every 8 (eight) hours as needed for pain.      lisinopril 10 MG tablet   Commonly known as: PRINIVIL,ZESTRIL   Take 1 tablet (10 mg total) by mouth daily.      metFORMIN 1000 MG tablet   Commonly known as: GLUCOPHAGE   Take 1,000 mg by mouth 2 (two) times daily with a meal.      metoprolol 50 MG tablet   Commonly known as: LOPRESSOR   Take 50 mg by mouth 2 (two) times daily.      naproxen 500 MG tablet   Commonly known as: NAPROSYN   Take 500 mg by mouth daily.      omeprazole 20 MG capsule   Commonly known as: PRILOSEC   Take 20 mg by mouth every morning.           Follow-up Information    Follow up with [facility]. On 07/08/2012. (4 pm, please bring $20 co pay and photo id)    Contact information:   [address] [place] [address] [phone]          The results of significant diagnostics from this hospitalization (including imaging, microbiology, ancillary and laboratory) are listed below for reference.    Significant Diagnostic Studies: Dg Chest 2 View  06/26/2012  *RADIOLOGY REPORT*  Clinical Data: 42 year old male with chest pain and shortness of breath.  CHEST - 2 VIEW  Comparison: 02/07/2012 and prior chest radiographs  Findings: The cardiomediastinal silhouette is unremarkable. The lungs are clear. There is no evidence of focal airspace disease, pulmonary edema, suspicious pulmonary nodule/mass, pleural effusion, or pneumothorax. No acute bony abnormalities are identified.  IMPRESSION: No evidence of active cardiopulmonary disease.   Original Report Authenticated By: Margarette Canada, M.D.    US  Abdomen Complete  06/26/2012  *RADIOLOGY REPORT*  Clinical Data:  Right upper quadrant pain.  Elevated lipase  ABDOMINAL ULTRASOUND COMPLETE  Comparison:  None.  Findings:  Gallbladder:  No gallstones, gallbladder wall thickening, or pericholecystic fluid.  Common Bile Duct:  Within normal limits in caliber.  Liver: No focal mass lesion identified.  Within normal limits in parenchymal echogenicity.  IVC:  Appears normal.  Pancreas:  Exam detail diminish due to bowel gas.  Spleen:  Within normal limits in size and echotexture.  Right kidney:  Normal in size and parenchymal echogenicity.  No evidence of mass or hydronephrosis.  Left kidney:  Normal in size and parenchymal echogenicity.  No evidence of mass or hydronephrosis. The cyst measures 1.3 x 1 x 1.1  cm.  Abdominal Aorta:  No aneurysm identified.  IMPRESSION:  1.  No acute findings identified. 2.  Nonvisualization of the pancreas secondary to overlying bowel gas.   Original Report Authenticated By: Kerby Moors, M.D.     Microbiology: No results found for this or any previous visit (from the past 240 hour(s)).   Labs: Basic Metabolic Panel:  Lab [id number] 0630 06/28/12 0717 06/27/12 0536 06/26/12 2149 06/26/12 1835 06/26/12 1538  NA 139 136 137 -- 138 135  K 3.6 3.5 4.1 -- 3.7 5.5*  CL 100 99 100 -- 100 95*  CO2 26 25 25  -- 27 28  GLUCOSE 110* 120* 121* -- 114* 135*  BUN 4* 4* 6 -- 9 10  CREATININE 0.63 0.60 0.53 0.62 0.66 --  CALCIUM 9.5 9.2 8.9 -- 9.3 9.7  MG -- -- -- -- -- --  PHOS -- -- -- -- -- --   Liver Function Tests:  Lab 06/29/12 0630 06/27/12 0536 06/26/12 1538  AST 18 22 26   ALT 19 10 10   ALKPHOS 70 65 76  BILITOT 0.4 0.5 0.6  PROT 7.5 7.2 8.8*  ALBUMIN 3.8 3.7 4.5    Lab 06/29/12 0630 06/28/12 0717 06/26/12 1538  LIPASE 78* 88* 149*  AMYLASE -- -- --   No results found for this basename: AMMONIA:5 in the last 168 hours CBC:  Lab 06/28/12 0717 06/27/12 0536 06/26/12 2149 06/26/12 1538  WBC 6.9 7.9 9.3 8.8    NEUTROABS -- -- -- 4.8  HGB 12.8* 12.9* 13.6 15.6  HCT 38.0* 38.6* 40.2 43.7  MCV 82.3 82.8 82.9 81.5  PLT 183 192 196 PLATELETS APPEAR ADEQUATE   Cardiac Enzymes: No results found for this basename: CKTOTAL:5,CKMB:5,CKMBINDEX:5,TROPONINI:5 in the last 168 hours BNP: BNP (last 3 results) No results found for this basename: PROBNP:3 in the last 8760 hours CBG:  Lab 06/30/12 1133 06/30/12 0756 06/29/12 2136 06/29/12 1700 06/29/12 1130  GLUCAP 190* 145* 188* 150* 143*       Signed:  Jerett Odonohue A  [facility] 06/30/2012, 11:53 AM

## 2012-06-30 NOTE — Progress Notes (Signed)
1230 Discharge instructions reviewed with patient   Verbalize and understand . Prescription given to patient. Skin WNL

## 2012-07-08 ENCOUNTER — Emergency Department (INDEPENDENT_AMBULATORY_CARE_PROVIDER_SITE_OTHER)
Admission: EM | Admit: 2012-07-08 | Discharge: 2012-07-08 | Disposition: A | Discharge status: Home / Self Care | Payer: Self-pay | Source: Home / Self Care | Attending: Family Medicine | Admitting: Family Medicine

## 2012-07-08 ENCOUNTER — Encounter (HOSPITAL_COMMUNITY): Payer: Self-pay

## 2012-07-08 ENCOUNTER — Emergency Department (INDEPENDENT_AMBULATORY_CARE_PROVIDER_SITE_OTHER): Payer: Self-pay

## 2012-07-08 DIAGNOSIS — R071 Chest pain on breathing: Secondary | ICD-10-CM

## 2012-07-08 DIAGNOSIS — K859 Acute pancreatitis without necrosis or infection, unspecified: Secondary | ICD-10-CM

## 2012-07-08 DIAGNOSIS — R0781 Pleurodynia: Secondary | ICD-10-CM

## 2012-07-08 DIAGNOSIS — I10 Essential (primary) hypertension: Secondary | ICD-10-CM

## 2012-07-08 DIAGNOSIS — R634 Abnormal weight loss: Secondary | ICD-10-CM

## 2012-07-08 LAB — CBC WITH DIFFERENTIAL/PLATELET
Basophils Absolute: 0 10*3/uL (ref 0.0–0.1)
HCT: 40.6 % (ref 39.0–52.0)
Hemoglobin: 14 g/dL (ref 13.0–17.0)
Lymphocytes Relative: 29 % (ref 12–46)
Monocytes Absolute: 1.1 10*3/uL — ABNORMAL HIGH (ref 0.1–1.0)
Neutro Abs: 8 10*3/uL — ABNORMAL HIGH (ref 1.7–7.7)
Neutrophils Relative %: 62 % (ref 43–77)
RDW: 12.9 % (ref 11.5–15.5)
WBC: 13 10*3/uL — ABNORMAL HIGH (ref 4.0–10.5)

## 2012-07-08 LAB — COMPREHENSIVE METABOLIC PANEL
ALT: 17 U/L (ref 0–53)
AST: 20 U/L (ref 0–37)
Albumin: 4.2 g/dL (ref 3.5–5.2)
Alkaline Phosphatase: 75 U/L (ref 39–117)
CO2: 30 mEq/L (ref 19–32)
Chloride: 97 mEq/L (ref 96–112)
Creatinine, Ser: 0.63 mg/dL (ref 0.50–1.35)
GFR calc non Af Amer: >90 mL/min (ref >90)
Potassium: 3.3 mEq/L — ABNORMAL LOW (ref 3.5–5.1)
Total Bilirubin: 0.2 mg/dL — ABNORMAL LOW (ref 0.3–1.2)

## 2012-07-08 LAB — LIPID PANEL
Cholesterol: 129 mg/dL (ref 0–200)
HDL: 40 mg/dL (ref >39)
Total CHOL/HDL Ratio: 3.2 RATIO

## 2012-07-08 LAB — RAPID URINE DRUG SCREEN, HOSP PERFORMED
Barbiturates: NOT DETECTED
Benzodiazepines: NOT DETECTED

## 2012-07-08 MED ORDER — LISINOPRIL-HYDROCHLOROTHIAZIDE 20-12.5 MG PO TABS: 1.0000 | ORAL_TABLET | Freq: Every day | ORAL | Status: DC

## 2012-07-08 MED ORDER — NAPROXEN 500 MG PO TABS: 500.0000 mg | ORAL_TABLET | Freq: Two times a day (BID) | ORAL | Status: DC

## 2012-07-08 MED ORDER — KETOROLAC TROMETHAMINE 60 MG/2ML IM SOLN
INTRAMUSCULAR | Status: AC
  Filled 2012-07-08: qty 2

## 2012-07-08 MED ORDER — KETOROLAC TROMETHAMINE 60 MG/2ML IM SOLN
60.0000 mg | Freq: Once | INTRAMUSCULAR | Status: AC
  Administered 2012-07-08: 60 mg via INTRAMUSCULAR

## 2012-07-08 NOTE — ED Notes (Signed)
Complain of shortness of breath and stomach pain

## 2012-07-08 NOTE — ED Provider Notes (Signed)
History    CSN: [id number]  Arrival date & time 07/08/12  1544   First MD Initiated Contact with Patient 07/08/12 1608    Chief Complaint  Patient presents with  . Follow-up   HPI Patient presented today to followup for his recent hospitalization where he was admitted for pancreatitis.  He also had uncontrolled diabetes mellitus and a recent weight loss of 40 pounds.  He was monitored in the hospital for short time and when he was able to begin he had been discharged home.  He reports he is having pleuritic type chest pain.  He reports chest pain with deep breaths and rolling onto the sides.  He reports some fever chills or coughing.  He reports that he has been having persistent abdominal pain but continues to eat and drink.  He reports no alcohol consumption.  He denies recreational drug use.  I reviewed his labs and medical records from his hospitalization including the discharge summary.  Patient was discharged with naproxen but he says he has not been taking it.  He reports that he has been taking his blood pressure medications but his blood pressure is elevated today and he is lightly tachycardic.     Past Medical History  Diagnosis Date  . Diabetes mellitus   . Hypertension   . Neuropathy of lower extremity     History reviewed. No pertinent past surgical history.  Family History  Problem Relation Age of Onset  . Diabetes Mother   . Diabetes Maternal Aunt     History  Substance Use Topics  . Smoking status: Never Smoker   . Smokeless tobacco: Not on file  . Alcohol Use: Yes     Comment: occas    Review of Systems  Constitutional: Positive for fatigue.  HENT: Positive for rhinorrhea.   Respiratory: Positive for chest tightness.   Cardiovascular: Positive for chest pain.  Gastrointestinal:       Epigastric abdominal pain  All other systems reviewed and are negative.    Allergies  Review of patient's allergies indicates no known allergies.  Home Medications    Current Outpatient Rx  Name  Route  Sig  Dispense  Refill  . GABAPENTIN 300 MG PO CAPS   Oral   Take 300 mg by mouth 3 (three) times daily.         Marland Kitchen GLIPIZIDE 10 MG PO TABS   Oral   Take 10 mg by mouth daily.         Marland Kitchen HYDROCODONE-ACETAMINOPHEN 5-325 MG PO TABS   Oral   Take 1 tablet by mouth every 8 (eight) hours as needed for pain.   20 tablet   0   . LISINOPRIL 10 MG PO TABS   Oral   Take 1 tablet (10 mg total) by mouth daily.   30 tablet   0   . METFORMIN HCL 1000 MG PO TABS   Oral   Take 1,000 mg by mouth 2 (two) times daily with a meal.           . METOPROLOL TARTRATE 50 MG PO TABS   Oral   Take 50 mg by mouth 2 (two) times daily.         Marland Kitchen NAPROXEN 500 MG PO TABS   Oral   Take 500 mg by mouth daily.         Marland Kitchen OMEPRAZOLE 20 MG PO CPDR   Oral   Take 20 mg by mouth every morning.  BP 150/103  Pulse 103  Temp 99.1 F (37.3 C) (Oral)  Resp 17  SpO2 100%  Physical Exam  Nursing note and vitals reviewed. Constitutional: He is oriented to person, place, and time. He appears well-developed. No distress.  HENT:  Head: Normocephalic and atraumatic.  Mouth/Throat: Oropharynx is clear and moist.  Eyes: EOM are normal. Pupils are equal, round, and reactive to light.  Neck: Normal range of motion. Neck supple.  Cardiovascular: Regular rhythm and normal heart sounds.        Mildly tachycardic  Pulmonary/Chest: Breath sounds normal.  Abdominal: Soft. Bowel sounds are normal. There is hepatosplenomegaly. There is tenderness in the right upper quadrant and epigastric area. There is guarding. There is no rebound, no CVA tenderness and negative Murphy's sign. No hernia.    Musculoskeletal: Normal range of motion.  Neurological: He is alert and oriented to person, place, and time.  Skin: Skin is warm and dry. He is not diaphoretic.       Multiple tattoos on the extremities  Psychiatric: He has a normal mood and affect. His behavior is  normal. Judgment and thought content normal.    ED Course  Procedures (including critical care time)   Labs Reviewed  CBC WITH DIFFERENTIAL  COMPREHENSIVE METABOLIC PANEL  LIPASE, BLOOD  URINE RAPID DRUG SCREEN (HOSP PERFORMED)   No results found.  No diagnosis found.  MDM  IMPRESSION  Pancreatitis  Chest wall pain pleuritic type  Weight loss-abnormal  History of alcohol use-heavy  Hypertension, poorly controlled  RECOMMENDATIONS / PLAN Check CXR - 2 view = no acute findings toradol 60mg  IM given Check lipase level to see that it is trending down I will have patient start taking his naproxen regularly for the pleuritic-type chest pain I will have the patient to increase the lisinopril to 20 mg Add hydrochlorothiazide 12.5 mg by mouth daily  FOLLOW UP 1 month  The patient was given clear instructions to go to ER or return to medical center if symptoms don't improve, worsen or new problems develop.  The patient verbalized understanding.  The patient was told to call to get lab results if they haven't heard anything in the next week.            Murlean Iba, MD 07/08/12 1720

## 2012-07-09 NOTE — Progress Notes (Signed)
Quick Note:  Please notify patient that his labs came back and his lipase level is starting to rise again which could be a sign that his pancreatitis is coming back. His white blood cell count is also elevated. If the patient develops any fever or chills cough, congestion, or becomes sicker he should go to the emergency department immediately. I would like for him to come back in followup in our office on Friday to have his white blood cell count retested. Also to retest his lipase level. If his symptoms become worse he should go to the emergency department.   Gerlene Fee, MD, CDE, [place], [place]   ______

## 2012-07-13 ENCOUNTER — Telehealth (HOSPITAL_COMMUNITY): Payer: Self-pay

## 2012-07-13 NOTE — Telephone Encounter (Signed)
Message copied by Dorothe Pea on Mon Jul 13, 2012  6:40 PM ------      Message from: Murlean Iba      Created: Thu Jul 09, 2012 10:17 AM       Please notify patient that his labs came back and his lipase level is starting to rise again which could be a sign that his pancreatitis is coming back.  His white blood cell count is also elevated.  If the patient develops any fever or chills cough, congestion, or becomes sicker he should go to the emergency department immediately.  I would like for him to come back in followup in our office on Friday to have his white blood cell count retested.  Also to retest his lipase level.  If his symptoms become worse he should go to the emergency department.                  Gerlene Fee, MD, CDE, [place], [place]

## 2012-07-20 NOTE — ED Notes (Signed)
Adult care clinic patient called to discuss lab reports. Message directly to [facility] staff member

## 2012-09-22 ENCOUNTER — Encounter (HOSPITAL_COMMUNITY): Payer: Self-pay | Admitting: Emergency Medicine

## 2012-09-22 ENCOUNTER — Emergency Department (HOSPITAL_COMMUNITY)
Admission: EM | Admit: 2012-09-22 | Discharge: 2012-09-22 | Disposition: A | Discharge status: Home / Self Care | Payer: Self-pay | Attending: Emergency Medicine | Admitting: Emergency Medicine

## 2012-09-22 DIAGNOSIS — R110 Nausea: Secondary | ICD-10-CM | POA: Insufficient documentation

## 2012-09-22 DIAGNOSIS — I10 Essential (primary) hypertension: Secondary | ICD-10-CM | POA: Insufficient documentation

## 2012-09-22 DIAGNOSIS — E119 Type 2 diabetes mellitus without complications: Secondary | ICD-10-CM | POA: Insufficient documentation

## 2012-09-22 DIAGNOSIS — R109 Unspecified abdominal pain: Secondary | ICD-10-CM | POA: Insufficient documentation

## 2012-09-22 DIAGNOSIS — Z79899 Other long term (current) drug therapy: Secondary | ICD-10-CM | POA: Insufficient documentation

## 2012-09-22 DIAGNOSIS — Z8669 Personal history of other diseases of the nervous system and sense organs: Secondary | ICD-10-CM | POA: Insufficient documentation

## 2012-09-22 DIAGNOSIS — Z8719 Personal history of other diseases of the digestive system: Secondary | ICD-10-CM | POA: Insufficient documentation

## 2012-09-22 DIAGNOSIS — F17200 Nicotine dependence, unspecified, uncomplicated: Secondary | ICD-10-CM | POA: Insufficient documentation

## 2012-09-22 DIAGNOSIS — H539 Unspecified visual disturbance: Secondary | ICD-10-CM | POA: Insufficient documentation

## 2012-09-22 HISTORY — DX: Acute pancreatitis without necrosis or infection, unspecified: K85.90

## 2012-09-22 LAB — URINALYSIS, ROUTINE W REFLEX MICROSCOPIC
Glucose, UA: NEGATIVE mg/dL
Hgb urine dipstick: NEGATIVE
Ketones, ur: 15 mg/dL — AB
Nitrite: NEGATIVE
Protein, ur: NEGATIVE mg/dL
Specific Gravity, Urine: 1.023 (ref 1.005–1.030)
Urobilinogen, UA: 1 mg/dL (ref 0.0–1.0)
pH: 6 (ref 5.0–8.0)

## 2012-09-22 LAB — CBC WITH DIFFERENTIAL/PLATELET
Basophils Absolute: 0 10*3/uL (ref 0.0–0.1)
Basophils Relative: 0 % (ref 0–1)
Eosinophils Absolute: 0.2 10*3/uL (ref 0.0–0.7)
Eosinophils Relative: 2 % (ref 0–5)
HCT: 36.1 % — ABNORMAL LOW (ref 39.0–52.0)
Hemoglobin: 12.5 g/dL — ABNORMAL LOW (ref 13.0–17.0)
Lymphocytes Relative: 33 % (ref 12–46)
Lymphs Abs: 3 10*3/uL (ref 0.7–4.0)
MCH: 28.7 pg (ref 26.0–34.0)
MCHC: 34.6 g/dL (ref 30.0–36.0)
MCV: 82.8 fL (ref 78.0–100.0)
Monocytes Absolute: 0.7 10*3/uL (ref 0.1–1.0)
Monocytes Relative: 8 % (ref 3–12)
Neutro Abs: 5.1 10*3/uL (ref 1.7–7.7)
Neutrophils Relative %: 57 % (ref 43–77)
Platelets: 227 10*3/uL (ref 150–400)
RBC: 4.36 MIL/uL (ref 4.22–5.81)
RDW: 13.1 % (ref 11.5–15.5)
WBC: 9 10*3/uL (ref 4.0–10.5)

## 2012-09-22 LAB — COMPREHENSIVE METABOLIC PANEL
ALT: 7 U/L (ref 0–53)
AST: 15 U/L (ref 0–37)
Albumin: 4 g/dL (ref 3.5–5.2)
Alkaline Phosphatase: 65 U/L (ref 39–117)
BUN: 18 mg/dL (ref 6–23)
CO2: 32 mEq/L (ref 19–32)
Calcium: 9.7 mg/dL (ref 8.4–10.5)
Chloride: 96 mEq/L (ref 96–112)
Creatinine, Ser: 0.84 mg/dL (ref 0.50–1.35)
GFR calc Af Amer: >90 mL/min (ref >90)
GFR calc non Af Amer: >90 mL/min (ref >90)
Glucose, Bld: 81 mg/dL (ref 70–99)
Potassium: 3.3 mEq/L — ABNORMAL LOW (ref 3.5–5.1)
Sodium: 137 mEq/L (ref 135–145)
Total Bilirubin: 0.4 mg/dL (ref 0.3–1.2)
Total Protein: 7.7 g/dL (ref 6.0–8.3)

## 2012-09-22 LAB — URINE MICROSCOPIC-ADD ON

## 2012-09-22 LAB — LIPASE, BLOOD: Lipase: 36 U/L (ref 11–59)

## 2012-09-22 MED ORDER — HYDROCODONE-ACETAMINOPHEN 5-325 MG PO TABS: 2.0000 | ORAL_TABLET | ORAL | Status: DC | PRN

## 2012-09-22 MED ORDER — MORPHINE SULFATE 4 MG/ML IJ SOLN
4.0000 mg | Freq: Once | INTRAMUSCULAR | Status: AC
  Administered 2012-09-22: 4 mg via INTRAVENOUS
  Filled 2012-09-22: qty 1

## 2012-09-22 MED ORDER — ONDANSETRON HCL 4 MG/2ML IJ SOLN
4.0000 mg | Freq: Once | INTRAMUSCULAR | Status: AC
  Administered 2012-09-22: 4 mg via INTRAVENOUS
  Filled 2012-09-22: qty 2

## 2012-09-22 MED ORDER — PROMETHAZINE HCL 25 MG PO TABS: 25.0000 mg | ORAL_TABLET | Freq: Four times a day (QID) | ORAL | Status: DC | PRN

## 2012-09-22 NOTE — ED Notes (Signed)
Pt unable to provide an urine specimen at this time; will attempt again later

## 2012-09-22 NOTE — ED Notes (Signed)
Pt placed in gown, on continuous pulse oximetry and blood pressure cuff

## 2012-09-22 NOTE — ED Notes (Signed)
Pt st's he has hx of pancreatitis.  St's pain feels the same.  Has been taking Naprosyn without relief.

## 2012-09-22 NOTE — ED Notes (Signed)
Pt up ambulatory at this time to attempt to provide an urine specimen

## 2012-09-22 NOTE — ED Notes (Signed)
Pt st's only slight relief from pain med.

## 2012-09-22 NOTE — ED Notes (Signed)
Onset 2 days ago "sharp, stabbing" abdominal pain LLQ and RUQ going around to the back. Pt reports hx of pancreatitis January 2014, and states that this pain is similar to that. Pt reports taking Naproxen for pain.

## 2012-09-22 NOTE — ED Provider Notes (Signed)
History     CSN: [id number]  Arrival date & time 09/22/12  1521   First MD Initiated Contact with Patient 09/22/12 1612      Chief Complaint  Patient presents with  . Abdominal Pain    (Consider location/radiation/quality/duration/timing/severity/associated sxs/prior treatment) HPI Comments: Patient is a 42 year old male who presents today with abdominal pain that has been gradually worsening for a week and a half. The pain is a sharp stabbing quality in his RUQ and LLQ with radiation to his back. He has been taking naproxen for pain with no relief. He states this feels like the pancreatitis he had in January. The only medications that work are Vicodin and "something they gave him through the IV". He has mild nausea, no vomiting. Denies fever, constipation, diarrhea, CP, SOB.   Patient is a 42 y.o. male presenting with abdominal pain. The history is provided by the patient.  Abdominal Pain Pain location:  RUQ and LLQ Pain quality: sharp and stabbing   Pain radiates to:  Back Pain severity:  Severe Onset quality:  Gradual Duration:  9 days Timing:  Constant Progression:  Worsening Chronicity:  New Context: not recent illness, not sick contacts and not suspicious food intake   Relieved by:  Nothing Worsened by:  Movement Ineffective treatments:  NSAIDs Associated symptoms: nausea   Associated symptoms: no chest pain, no chills, no constipation, no fever, no hematemesis, no hematochezia and no vomiting   Nausea:    Severity:  Mild   Onset quality:  Gradual   Duration:  9 days   Timing:  Constant   Progression:  Worsening   Past Medical History  Diagnosis Date  . Diabetes mellitus   . Hypertension   . Neuropathy of lower extremity   . Pancreatitis 06/2012    History reviewed. No pertinent past surgical history.  Family History  Problem Relation Age of Onset  . Diabetes Mother   . Diabetes Maternal Aunt     History  Substance Use Topics  . Smoking status: Current  Every Day Smoker -- 0.25 packs/day    Types: Cigarettes  . Smokeless tobacco: Not on file  . Alcohol Use: Yes     Comment: occas      Review of Systems  Constitutional: Negative for fever and chills.  Eyes: Positive for visual disturbance (blurry vision - diabetic ).  Cardiovascular: Negative for chest pain.  Gastrointestinal: Positive for nausea and abdominal pain. Negative for vomiting, constipation, hematochezia and hematemesis.  Skin: Negative for rash.  All other systems reviewed and are negative.    Allergies  Review of patient's allergies indicates no known allergies.  Home Medications   Current Outpatient Rx  Name  Route  Sig  Dispense  Refill  . gabapentin (NEURONTIN) 300 MG capsule   Oral   Take 300 mg by mouth 3 (three) times daily.         Marland Kitchen glipiZIDE (GLUCOTROL) 10 MG tablet   Oral   Take 10 mg by mouth daily.         Marland Kitchen HYDROcodone-acetaminophen (NORCO/VICODIN) 5-325 MG per tablet   Oral   Take 1 tablet by mouth every 8 (eight) hours as needed for pain.   20 tablet   0   . lisinopril-hydrochlorothiazide (PRINZIDE,ZESTORETIC) 20-12.5 MG per tablet   Oral   Take 1 tablet by mouth daily.   30 tablet   3   . metFORMIN (GLUCOPHAGE) 1000 MG tablet   Oral   Take 1,000 mg  by mouth 2 (two) times daily with a meal.           . metoprolol (LOPRESSOR) 50 MG tablet   Oral   Take 50 mg by mouth 2 (two) times daily.         . naproxen (NAPROSYN) 500 MG tablet   Oral   Take 1 tablet (500 mg total) by mouth 2 (two) times daily with a meal.   14 tablet   1   . omeprazole (PRILOSEC) 20 MG capsule   Oral   Take 20 mg by mouth every morning.           BP 100/60  Pulse 106  Temp(Src) 97.9 F (36.6 C) (Oral)  Resp 18  Ht 5\' 3"  (1.6 m)  Wt 130 lb (58.968 kg)  BMI 23.03 kg/m2  SpO2 97%  Physical Exam  Nursing note and vitals reviewed. Constitutional: He is oriented to person, place, and time. Vital signs are normal. He appears well-developed  and well-nourished. He does not appear ill. No distress.  Patient appears very comfortable - standing and talking; in no distress   HENT:  Head: Normocephalic and atraumatic.  Right Ear: External ear normal.  Left Ear: External ear normal.  Nose: Nose normal.  Eyes: Conjunctivae are normal.  Neck: Normal range of motion. No tracheal deviation present.  Cardiovascular: Normal rate, regular rhythm and normal heart sounds.   Pulmonary/Chest: Effort normal and breath sounds normal. No stridor.  Abdominal: Soft. Normal appearance and bowel sounds are normal. He exhibits no distension. There is tenderness in the right upper quadrant, epigastric area, periumbilical area and left lower quadrant. There is no rigidity, no rebound, no guarding, no tenderness at McBurney's point and negative Murphy's sign.  Musculoskeletal: Normal range of motion.  Neurological: He is alert and oriented to person, place, and time.  Skin: Skin is warm and dry. He is not diaphoretic.  Psychiatric: He has a normal mood and affect. His behavior is normal.    ED Course  Procedures (including critical care time)  Labs Reviewed  CBC WITH DIFFERENTIAL - Abnormal; Notable for the following:    Hemoglobin 12.5 (*)    HCT 36.1 (*)    All other components within normal limits  COMPREHENSIVE METABOLIC PANEL - Abnormal; Notable for the following:    Potassium 3.3 (*)    All other components within normal limits  URINALYSIS, ROUTINE W REFLEX MICROSCOPIC - Abnormal; Notable for the following:    Bilirubin Urine SMALL (*)    Ketones, ur 15 (*)    Leukocytes, UA TRACE (*)    All other components within normal limits  URINE MICROSCOPIC-ADD ON - Abnormal; Notable for the following:    Casts HYALINE CASTS (*)    All other components within normal limits  LIPASE, BLOOD   No results found.   1. Abdominal pain       MDM  Patient presents with abdominal pain that has been gradually worsening over the past week and a half.  He has a history of pancreatitis in January. RUQ and LLQ tenderness. Labs generally WNL. Afebrile. Patient in absolutely no distress. No concern for pancreatitis, appendicitis. Low concern for cholecystitis, cholelithiasis. Strict return precautions given. Vital signs stable for discharge. Discussed patient with Dr. Wilson Singer and he agrees with plan. Patient / Family / Caregiver understand and agree with initial ED impression and plan with expectations set for ED visit.        Elwyn Lade, PA-C 09/23/12 [phone]

## 2012-09-29 NOTE — ED Provider Notes (Signed)
Medical screening examination/treatment/procedure(s) were performed by non-physician practitioner and as supervising physician I was immediately available for consultation/collaboration.  Virgel Manifold, MD 09/29/12 [phone]

## 2012-12-28 ENCOUNTER — Emergency Department (HOSPITAL_COMMUNITY)
Admission: EM | Admit: 2012-12-28 | Discharge: 2012-12-28 | Disposition: A | Discharge status: Home / Self Care | Payer: Self-pay | Attending: Emergency Medicine | Admitting: Emergency Medicine

## 2012-12-28 ENCOUNTER — Encounter (HOSPITAL_COMMUNITY): Payer: Self-pay | Admitting: Family Medicine

## 2012-12-28 ENCOUNTER — Emergency Department (HOSPITAL_COMMUNITY): Payer: Self-pay

## 2012-12-28 DIAGNOSIS — R358 Other polyuria: Secondary | ICD-10-CM | POA: Insufficient documentation

## 2012-12-28 DIAGNOSIS — G5790 Unspecified mononeuropathy of unspecified lower limb: Secondary | ICD-10-CM | POA: Insufficient documentation

## 2012-12-28 DIAGNOSIS — R109 Unspecified abdominal pain: Secondary | ICD-10-CM

## 2012-12-28 DIAGNOSIS — R3589 Other polyuria: Secondary | ICD-10-CM | POA: Insufficient documentation

## 2012-12-28 DIAGNOSIS — R110 Nausea: Secondary | ICD-10-CM | POA: Insufficient documentation

## 2012-12-28 DIAGNOSIS — Z79899 Other long term (current) drug therapy: Secondary | ICD-10-CM | POA: Insufficient documentation

## 2012-12-28 DIAGNOSIS — E119 Type 2 diabetes mellitus without complications: Secondary | ICD-10-CM | POA: Insufficient documentation

## 2012-12-28 DIAGNOSIS — Z8719 Personal history of other diseases of the digestive system: Secondary | ICD-10-CM | POA: Insufficient documentation

## 2012-12-28 DIAGNOSIS — F17200 Nicotine dependence, unspecified, uncomplicated: Secondary | ICD-10-CM | POA: Insufficient documentation

## 2012-12-28 DIAGNOSIS — I10 Essential (primary) hypertension: Secondary | ICD-10-CM | POA: Insufficient documentation

## 2012-12-28 HISTORY — DX: Unspecified abdominal pain: R10.9

## 2012-12-28 LAB — COMPREHENSIVE METABOLIC PANEL
ALT: 18 U/L (ref 0–53)
CO2: 32 mEq/L (ref 19–32)
Calcium: 9.9 mg/dL (ref 8.4–10.5)
Creatinine, Ser: 0.72 mg/dL (ref 0.50–1.35)
GFR calc Af Amer: >90 mL/min (ref >90)
GFR calc non Af Amer: >90 mL/min (ref >90)
Glucose, Bld: 210 mg/dL — ABNORMAL HIGH (ref 70–99)
Sodium: 138 mEq/L (ref 135–145)
Total Protein: 8.3 g/dL (ref 6.0–8.3)

## 2012-12-28 LAB — CBC WITH DIFFERENTIAL/PLATELET
Basophils Absolute: 0 10*3/uL (ref 0.0–0.1)
Eosinophils Absolute: 0.1 10*3/uL (ref 0.0–0.7)
Eosinophils Relative: 1 % (ref 0–5)
HCT: 39 % (ref 39.0–52.0)
Lymphs Abs: 2.9 10*3/uL (ref 0.7–4.0)
MCH: 29.4 pg (ref 26.0–34.0)
MCV: 86.9 fL (ref 78.0–100.0)
Monocytes Absolute: 0.8 10*3/uL (ref 0.1–1.0)
Platelets: 231 10*3/uL (ref 150–400)
RDW: 13.2 % (ref 11.5–15.5)

## 2012-12-28 LAB — URINALYSIS, ROUTINE W REFLEX MICROSCOPIC
Hgb urine dipstick: NEGATIVE
Leukocytes, UA: NEGATIVE
Nitrite: NEGATIVE
Protein, ur: NEGATIVE mg/dL
Specific Gravity, Urine: 1.013 (ref 1.005–1.030)
Urobilinogen, UA: 0.2 mg/dL (ref 0.0–1.0)

## 2012-12-28 LAB — GLUCOSE, CAPILLARY: Glucose-Capillary: 136 mg/dL — ABNORMAL HIGH (ref 70–99)

## 2012-12-28 MED ORDER — HYDROMORPHONE HCL PF 1 MG/ML IJ SOLN
0.5000 mg | Freq: Once | INTRAMUSCULAR | Status: AC
  Administered 2012-12-28: 0.5 mg via INTRAVENOUS
  Filled 2012-12-28: qty 1

## 2012-12-28 MED ORDER — DICYCLOMINE HCL 20 MG PO TABS: 20.0000 mg | ORAL_TABLET | Freq: Two times a day (BID) | ORAL | Status: DC

## 2012-12-28 MED ORDER — IOHEXOL 300 MG/ML  SOLN
25.0000 mL | INTRAMUSCULAR | Status: AC
  Administered 2012-12-28: 25 mL via ORAL

## 2012-12-28 MED ORDER — ONDANSETRON HCL 4 MG/2ML IJ SOLN
4.0000 mg | Freq: Once | INTRAMUSCULAR | Status: AC
  Administered 2012-12-28: 4 mg via INTRAVENOUS
  Filled 2012-12-28: qty 2

## 2012-12-28 MED ORDER — IOHEXOL 300 MG/ML  SOLN
100.0000 mL | Freq: Once | INTRAMUSCULAR | Status: AC | PRN
  Administered 2012-12-28: 100 mL via INTRAVENOUS

## 2012-12-28 NOTE — ED Notes (Signed)
Checked patient blood sugar it was 136 notified RN Antony Haste of blood sugar

## 2012-12-28 NOTE — ED Provider Notes (Signed)
History    CSN: [id number] Arrival date & time 12/28/12  1348  First MD Initiated Contact with Patient 12/28/12 1513     Chief Complaint  Patient presents with  . Abdominal Pain   (Consider location/radiation/quality/duration/timing/severity/associated sxs/prior Treatment) Patient is a 42 y.o. male presenting with abdominal pain. The history is provided by the patient.  Abdominal Pain This is a chronic (RUQ/epigastric pain started about 6 months ago but acutely worsened 5 days ago) problem. The current episode started more than 1 month ago. The problem occurs constantly. The problem has been gradually worsening. Associated symptoms include abdominal pain, nausea (started 2-3 days ago, worsening, but tolerating PO) and urinary symptoms (polyuria worsening over 1 week). Pertinent negatives include no anorexia, arthralgias, change in bowel habit, chest pain, chills, congestion, coughing, diaphoresis, fatigue, fever, headaches, joint swelling, myalgias, neck pain, numbness, rash, sore throat, visual change, vomiting or weakness. Nothing aggravates the symptoms. He has tried nothing for the symptoms.   Past Medical History  Diagnosis Date  . Diabetes mellitus   . Hypertension   . Neuropathy of lower extremity   . Pancreatitis 06/2012   History reviewed. No pertinent past surgical history. Family History  Problem Relation Age of Onset  . Diabetes Mother   . Diabetes Maternal Aunt    History  Substance Use Topics  . Smoking status: Current Every Day Smoker -- 0.25 packs/day    Types: Cigarettes  . Smokeless tobacco: Not on file  . Alcohol Use: Yes     Comment: occas    Review of Systems  Constitutional: Negative for fever, chills, diaphoresis, activity change, appetite change and fatigue.  HENT: Negative for congestion, sore throat, rhinorrhea, sneezing, trouble swallowing and neck pain.   Eyes: Negative for pain and redness.  Respiratory: Negative for cough, choking, chest  tightness, shortness of breath, wheezing and stridor.   Cardiovascular: Negative for chest pain and leg swelling.  Gastrointestinal: Positive for nausea (started 2-3 days ago, worsening, but tolerating PO) and abdominal pain. Negative for vomiting, diarrhea, constipation, blood in stool, abdominal distention, anal bleeding, anorexia and change in bowel habit.  Musculoskeletal: Negative for myalgias, back pain, joint swelling and arthralgias.  Skin: Negative for rash.  Neurological: Negative for dizziness, speech difficulty, weakness, light-headedness, numbness and headaches.  Hematological: Negative for adenopathy.  Psychiatric/Behavioral: Negative for confusion.    Allergies  Morphine and related  Home Medications   Current Outpatient Rx  Name  Route  Sig  Dispense  Refill  . gabapentin (NEURONTIN) 300 MG capsule   Oral   Take 300 mg by mouth 3 (three) times daily.         Marland Kitchen glipiZIDE (GLUCOTROL) 10 MG tablet   Oral   Take 10 mg by mouth daily.         Marland Kitchen lisinopril-hydrochlorothiazide (PRINZIDE,ZESTORETIC) 20-12.5 MG per tablet   Oral   Take 1 tablet by mouth daily.         . metFORMIN (GLUCOPHAGE) 1000 MG tablet   Oral   Take 1,000 mg by mouth 2 (two) times daily with a meal.           . metoprolol (LOPRESSOR) 50 MG tablet   Oral   Take 50 mg by mouth 2 (two) times daily.         Marland Kitchen omeprazole (PRILOSEC) 20 MG capsule   Oral   Take 20 mg by mouth daily as needed (Acid reflux).          . traMADol (  ULTRAM) 50 MG tablet   Oral   Take 50 mg by mouth every 6 (six) hours as needed for pain.         . traZODone (DESYREL) 100 MG tablet   Oral   Take 100 mg by mouth at bedtime as needed for sleep.          Marland Kitchen dicyclomine (BENTYL) 20 MG tablet   Oral   Take 1 tablet (20 mg total) by mouth 2 (two) times daily.   20 tablet   0    BP 122/86  Pulse 85  Temp(Src) 98.9 F (37.2 C) (Oral)  Resp 16  Ht 5\' 3"  (1.6 m)  Wt 140 lb (63.504 kg)  BMI 24.81 kg/m2   SpO2 100% Physical Exam  Nursing note and vitals reviewed. Constitutional: He is oriented to person, place, and time. He appears well-developed and well-nourished. No distress.  HENT:  Head: Normocephalic and atraumatic.  Eyes: Conjunctivae and EOM are normal. Right eye exhibits no discharge. Left eye exhibits no discharge.  Neck: Normal range of motion. Neck supple. No tracheal deviation present.  Cardiovascular: Normal rate, regular rhythm and normal heart sounds.  Exam reveals no friction rub.   No murmur heard. Pulmonary/Chest: Effort normal and breath sounds normal. No stridor. No respiratory distress. He has no wheezes. He has no rales. He exhibits no tenderness.  Abdominal: Soft. Bowel sounds are normal. He exhibits no distension. There is tenderness (RUQ, and epigastric). There is no rebound and no guarding.  Neurological: He is alert and oriented to person, place, and time.  Skin: Skin is warm.  Psychiatric: He has a normal mood and affect.    ED Course  Procedures (including critical care time) Labs Reviewed  CBC WITH DIFFERENTIAL - Abnormal; Notable for the following:    WBC 10.6 (*)    All other components within normal limits  COMPREHENSIVE METABOLIC PANEL - Abnormal; Notable for the following:    Glucose, Bld 210 (*)    Total Bilirubin 0.2 (*)    All other components within normal limits  LIPASE, BLOOD - Abnormal; Notable for the following:    Lipase 64 (*)    All other components within normal limits  GLUCOSE, CAPILLARY - Abnormal; Notable for the following:    Glucose-Capillary 136 (*)    All other components within normal limits  URINALYSIS, ROUTINE W REFLEX MICROSCOPIC   Ct Abdomen Pelvis W Contrast  12/28/2012   *RADIOLOGY REPORT*  Clinical Data: Right upper quadrant abdominal pain.  CT ABDOMEN AND PELVIS WITH CONTRAST  Technique:  Multidetector CT imaging of the abdomen and pelvis was performed following the standard protocol during bolus administration of  intravenous contrast.  Contrast: 147mL OMNIPAQUE IOHEXOL 300 MG/ML  SOLN  Comparison: Chest CT 12/20/1988  Findings: Lung bases are clear.  Negative for free air.  There is a 1.1 cm dense structure along the inferior right hepatic lobe on sequence 2, image 32.  This probably represents an enhancing structure.  Otherwise, there is no focal abnormality in the liver. There is decreased attenuation of the liver could represent hepatic steatosis.  Normal appearance of the gallbladder and portal venous system.  Normal appearance of the pancreas, spleen, adrenal glands, and both kidneys.  Prostate is prominent for size, measuring 5.2 cm in transverse dimension.  No acute abnormalities involving the small or large bowel.  No significant free fluid or lymphadenopathy.  No acute bony abnormality.  IMPRESSION: No acute abnormalities within the abdomen or pelvis.  Indeterminate 1.1 cm lesion in the inferior right hepatic lobe. This is likely an incidental finding if the patient has no history of malignancy or cirrhosis.  Benign etiologies would include a flash filling hemangioma, adenoma or FNH.  Further evaluation of this finding could be performed with MRI if needed.  Slightly decreased attenuation of the liver may represent hepatic steatosis.  This finding was more conspicuous on the previous Chest CT examination.   Original Report Authenticated By: Markus Daft, M.D.   1. Abdominal pain    Results for orders placed during the hospital encounter of 12/28/12  CBC WITH DIFFERENTIAL      Result Value Range   WBC 10.6 (*) 4.0 - 10.5 K/uL   RBC 4.49  4.22 - 5.81 MIL/uL   Hemoglobin 13.2  13.0 - 17.0 g/dL   HCT 39.0  39.0 - 52.0 %   MCV 86.9  78.0 - 100.0 fL   MCH 29.4  26.0 - 34.0 pg   MCHC 33.8  30.0 - 36.0 g/dL   RDW 13.2  11.5 - 15.5 %   Platelets 231  150 - 400 K/uL   Neutrophils Relative % 64  43 - 77 %   Neutro Abs 6.7  1.7 - 7.7 K/uL   Lymphocytes Relative 27  12 - 46 %   Lymphs Abs 2.9  0.7 - 4.0 K/uL    Monocytes Relative 8  3 - 12 %   Monocytes Absolute 0.8  0.1 - 1.0 K/uL   Eosinophils Relative 1  0 - 5 %   Eosinophils Absolute 0.1  0.0 - 0.7 K/uL   Basophils Relative 0  0 - 1 %   Basophils Absolute 0.0  0.0 - 0.1 K/uL  COMPREHENSIVE METABOLIC PANEL      Result Value Range   Sodium 138  135 - 145 mEq/L   Potassium 4.1  3.5 - 5.1 mEq/L   Chloride 98  96 - 112 mEq/L   CO2 32  19 - 32 mEq/L   Glucose, Bld 210 (*) 70 - 99 mg/dL   BUN 13  6 - 23 mg/dL   Creatinine, Ser 0.72  0.50 - 1.35 mg/dL   Calcium 9.9  8.4 - 10.5 mg/dL   Total Protein 8.3  6.0 - 8.3 g/dL   Albumin 4.0  3.5 - 5.2 g/dL   AST 19  0 - 37 U/L   ALT 18  0 - 53 U/L   Alkaline Phosphatase 96  39 - 117 U/L   Total Bilirubin 0.2 (*) 0.3 - 1.2 mg/dL   GFR calc non Af Amer >90  >90 mL/min   GFR calc Af Amer >90  >90 mL/min  LIPASE, BLOOD      Result Value Range   Lipase 64 (*) 11 - 59 U/L  URINALYSIS, ROUTINE W REFLEX MICROSCOPIC      Result Value Range   Color, Urine YELLOW  YELLOW   APPearance CLEAR  CLEAR   Specific Gravity, Urine 1.013  1.005 - 1.030   pH 7.0  5.0 - 8.0   Glucose, UA NEGATIVE  NEGATIVE mg/dL   Hgb urine dipstick NEGATIVE  NEGATIVE   Bilirubin Urine NEGATIVE  NEGATIVE   Ketones, ur NEGATIVE  NEGATIVE mg/dL   Protein, ur NEGATIVE  NEGATIVE mg/dL   Urobilinogen, UA 0.2  0.0 - 1.0 mg/dL   Nitrite NEGATIVE  NEGATIVE   Leukocytes, UA NEGATIVE  NEGATIVE  GLUCOSE, CAPILLARY      Result Value Range  Glucose-Capillary 136 (*) 70 - 99 mg/dL    MDM  Christopher Davila 42 y.o. presents with abd pain. Pain is positional and reproducible. Abdominal exam there is RUQ/epigastric tenderness to palpation.Marland Kitchen He's afebrile vital signs are stable. Mucous membranes are moist and dry. Doubt dehydration. EKG unchanged from previous. Reflex troponin negative. Considering the patient has no recent CT scans and in face of current exam findings, CT abdomen indicated. White blood cell count slightly elevated at 10.6. Lipase 64.  CT negative for an acute intra-abdominal etiology of his symptoms. In the setting of a slightly elevated lipase and normal appearing pancreas on CT scan I doubt acute pancreatitis. LFTs within normal limits. There were some incidental findings noted on exam which were discussed the patient being appropriate followup. Pain control provided, patient given IV hydration. Patient given Bentyl for concern of functional abdominal pain. He was instructed to follow up with GI on outpatient basis. He was given strong return precautions. Labs and imaging EKG reviewed. I discussed this patient;s care with my attending, Dr. Alvino Chapel.   Kelby Aline, MD 12/29/12 [phone]

## 2012-12-28 NOTE — ED Notes (Signed)
Per pt sts RUQ pain with nausea x a few weeks. sts after he eats he gets bloated.

## 2012-12-29 NOTE — ED Provider Notes (Signed)
I saw and evaluated the patient, reviewed the resident's note and I agree with the findings and plan.  is a acute on chronic abdominal pain. head CT scan done due to the elevated lipase in the past  and has not had a CT previously. Lab was otherwise reassuring. Will be discharged home   Jasper Riling. Alvino Chapel, MD 12/29/12 1456

## 2012-12-30 ENCOUNTER — Encounter: Payer: Self-pay | Admitting: Internal Medicine

## 2013-01-20 ENCOUNTER — Encounter: Payer: Self-pay | Admitting: Internal Medicine

## 2013-01-21 ENCOUNTER — Ambulatory Visit: Payer: Self-pay | Admitting: Internal Medicine

## 2013-03-10 ENCOUNTER — Encounter (HOSPITAL_COMMUNITY): Payer: Self-pay | Admitting: Physical Medicine and Rehabilitation

## 2013-03-10 ENCOUNTER — Emergency Department (HOSPITAL_COMMUNITY)
Admission: EM | Admit: 2013-03-10 | Discharge: 2013-03-10 | Disposition: A | Discharge status: Home / Self Care | Payer: Self-pay | Attending: Emergency Medicine | Admitting: Emergency Medicine

## 2013-03-10 DIAGNOSIS — K2970 Gastritis, unspecified, without bleeding: Secondary | ICD-10-CM

## 2013-03-10 DIAGNOSIS — Z8669 Personal history of other diseases of the nervous system and sense organs: Secondary | ICD-10-CM | POA: Insufficient documentation

## 2013-03-10 DIAGNOSIS — K2900 Acute gastritis without bleeding: Secondary | ICD-10-CM | POA: Insufficient documentation

## 2013-03-10 DIAGNOSIS — F17200 Nicotine dependence, unspecified, uncomplicated: Secondary | ICD-10-CM | POA: Insufficient documentation

## 2013-03-10 DIAGNOSIS — Z79899 Other long term (current) drug therapy: Secondary | ICD-10-CM | POA: Insufficient documentation

## 2013-03-10 DIAGNOSIS — I10 Essential (primary) hypertension: Secondary | ICD-10-CM | POA: Insufficient documentation

## 2013-03-10 DIAGNOSIS — E119 Type 2 diabetes mellitus without complications: Secondary | ICD-10-CM | POA: Insufficient documentation

## 2013-03-10 DIAGNOSIS — R1013 Epigastric pain: Secondary | ICD-10-CM | POA: Insufficient documentation

## 2013-03-10 LAB — CBC WITH DIFFERENTIAL/PLATELET
Basophils Relative: 0 % (ref 0–1)
Eosinophils Absolute: 0.2 10*3/uL (ref 0.0–0.7)
Eosinophils Relative: 2 % (ref 0–5)
Lymphocytes Relative: 33 % (ref 12–46)
Lymphs Abs: 2.8 10*3/uL (ref 0.7–4.0)
MCH: 28.8 pg (ref 26.0–34.0)
MCHC: 33.5 g/dL (ref 30.0–36.0)
MCV: 85.8 fL (ref 78.0–100.0)
Neutrophils Relative %: 57 % (ref 43–77)
Platelets: 189 10*3/uL (ref 150–400)
RBC: 4.52 MIL/uL (ref 4.22–5.81)

## 2013-03-10 LAB — COMPREHENSIVE METABOLIC PANEL
ALT: 32 U/L (ref 0–53)
AST: 27 U/L (ref 0–37)
Alkaline Phosphatase: 97 U/L (ref 39–117)
CO2: 26 mEq/L (ref 19–32)
Calcium: 9.3 mg/dL (ref 8.4–10.5)
Creatinine, Ser: 0.71 mg/dL (ref 0.50–1.35)
GFR calc Af Amer: >90 mL/min (ref >90)
GFR calc non Af Amer: >90 mL/min (ref >90)
Glucose, Bld: 179 mg/dL — ABNORMAL HIGH (ref 70–99)
Sodium: 142 mEq/L (ref 135–145)
Total Protein: 8.1 g/dL (ref 6.0–8.3)

## 2013-03-10 LAB — LIPASE, BLOOD: Lipase: 46 U/L (ref 11–59)

## 2013-03-10 LAB — URINE MICROSCOPIC-ADD ON

## 2013-03-10 LAB — URINALYSIS, ROUTINE W REFLEX MICROSCOPIC
Bilirubin Urine: NEGATIVE
Nitrite: NEGATIVE
Specific Gravity, Urine: 1.024 (ref 1.005–1.030)
Urobilinogen, UA: 0.2 mg/dL (ref 0.0–1.0)
pH: 5.5 (ref 5.0–8.0)

## 2013-03-10 MED ORDER — OXYCODONE-ACETAMINOPHEN 5-325 MG PO TABS
2.0000 | ORAL_TABLET | Freq: Once | ORAL | Status: AC
  Administered 2013-03-10: 2 via ORAL
  Filled 2013-03-10: qty 2

## 2013-03-10 MED ORDER — FAMOTIDINE 20 MG PO TABS: 20.0000 mg | ORAL_TABLET | Freq: Two times a day (BID) | ORAL | Status: DC

## 2013-03-10 MED ORDER — FAMOTIDINE 20 MG PO TABS
20.0000 mg | ORAL_TABLET | Freq: Once | ORAL | Status: AC
  Administered 2013-03-10: 20 mg via ORAL
  Filled 2013-03-10: qty 1

## 2013-03-10 NOTE — ED Notes (Signed)
Pt unable to give urine sample in triage

## 2013-03-10 NOTE — ED Provider Notes (Signed)
42 year old male, history of intermittent heavy alcohol use, history of daily abdominal pain located in the epigastrium and the right upper quadrant which has been going on for over 2 months according to the patient. He is vague in his history but states that it is daily, worse when he lays down, better when he sits up, worse after he eats. He has had one episode of vomiting, on exam he has a soft nontender needle abdomen with tenderness in the epigastrium and right upper quadrant, no guarding, no Murphy's sign, no pain at McBurney's point. He has no CVA tenderness, his laboratory workup is normal with no signs of transaminitis or elevated lipase or leukocytosis. The patient will be treated with antihistamines such as Pepcid, pain medication, home with antacid medications and followup with [facility] for evaluation of his gallbladder. Review of his past medical records shows that he has had both ultrasounds and CT scans none of which have showed acute cholecystitis or any abnormalities with his gallbladder. His last CT scan performed approximately 2-1/2 months ago did not show any acute abnormalities. His lipase is normal today. I discussed this with the patient and he is in agreement with the plan.  Medical screening examination/treatment/procedure(s) were conducted as a shared visit with non-physician practitioner(s) and myself.  I personally evaluated the patient during the encounter.  Clinical Impression: gastritis  Johnna Acosta, MD 03/10/13 [phone]

## 2013-03-10 NOTE — ED Notes (Signed)
Pt presents to department for evaluation of upper abdominal pain. Ongoing for several days. States he has pancreatitis. Also states nausea/vomiting. 8/10 pain upon arrival. He is conscious alert and oriented x4. NAD.

## 2013-03-10 NOTE — ED Provider Notes (Signed)
CSN: [id number]     Arrival date & time 03/10/13  1548 History   First MD Initiated Contact with Patient 03/10/13 1843     Chief Complaint  Patient presents with  . Abdominal Pain   (Consider location/radiation/quality/duration/timing/severity/associated sxs/prior Treatment) Patient is a 42 y.o. male presenting with abdominal pain. The history is provided by the patient. No language interpreter was used.  Abdominal Pain Pain location:  RUQ Pain quality: aching and bloating   Pain radiates to:  RUQ and epigastric region Pain severity:  Moderate Onset quality:  Gradual Duration:  4 days Timing:  Intermittent Progression:  Unchanged Chronicity:  Recurrent Context: not sick contacts   Context comment:  Last drank alcohol on Saturday, 4-5 shots Worsened by:  Nothing tried Associated symptoms: nausea   Associated symptoms: no diarrhea, no fever and no shortness of breath   Nausea:    Severity:  Mild   Onset quality:  Gradual   Timing:  Intermittent   Progression:  Waxing and waning  Pt is a 42 year old male who presents to the ER with c/o upper abdominal pain, worsening  for the last 4 days. He reports that he has pancreatitis and that this feels the same. He reported one episode of vomiting yesterday. He denies recent illness, fever, diarrhea or known sick exposure. He reports that he has been avoiding spicy foods but otherwise his diet has been unchanged. Urinating without any difficulty, burning or hematuria. He reports normal bowel movements.      Past Medical History  Diagnosis Date  . Diabetes mellitus   . Hypertension   . Neuropathy of lower extremity   . Pancreatitis 06/2012   No past surgical history on file. Family History  Problem Relation Age of Onset  . Diabetes Mother   . Diabetes Maternal Aunt    History  Substance Use Topics  . Smoking status: Current Every Day Smoker -- 0.25 packs/day    Types: Cigarettes  . Smokeless tobacco: Not on file  . Alcohol  Use: No    Review of Systems  Constitutional: Negative for fever.  Respiratory: Negative for shortness of breath.   Gastrointestinal: Positive for nausea and abdominal pain. Negative for diarrhea.  All other systems reviewed and are negative.    Allergies  Morphine and related  Home Medications   Current Outpatient Rx  Name  Route  Sig  Dispense  Refill  . dicyclomine (BENTYL) 20 MG tablet   Oral   Take 1 tablet (20 mg total) by mouth 2 (two) times daily.   20 tablet   0   . gabapentin (NEURONTIN) 300 MG capsule   Oral   Take 300 mg by mouth 3 (three) times daily.         Marland Kitchen glipiZIDE (GLUCOTROL) 10 MG tablet   Oral   Take 10 mg by mouth daily.         Marland Kitchen lisinopril-hydrochlorothiazide (PRINZIDE,ZESTORETIC) 20-12.5 MG per tablet   Oral   Take 1 tablet by mouth daily.         . metFORMIN (GLUCOPHAGE) 1000 MG tablet   Oral   Take 1,000 mg by mouth 2 (two) times daily with a meal.           . metoprolol (LOPRESSOR) 50 MG tablet   Oral   Take 50 mg by mouth 2 (two) times daily.         . naproxen (NAPROSYN) 250 MG tablet   Oral   Take 250 mg  by mouth 2 (two) times daily with a meal.         . omeprazole (PRILOSEC) 20 MG capsule   Oral   Take 20 mg by mouth daily as needed (Acid reflux).          . traMADol (ULTRAM) 50 MG tablet   Oral   Take 50 mg by mouth every 6 (six) hours as needed for pain.          BP 128/74  Pulse 97  Temp(Src) 99.2 F (37.3 C) (Oral)  Resp 18  Ht 5\' 3"  (1.6 m)  Wt 135 lb 12.8 oz (61.598 kg)  BMI 24.06 kg/m2  SpO2 98% Physical Exam  Nursing note and vitals reviewed. Constitutional: He is oriented to person, place, and time. He appears well-developed and well-nourished. No distress.  HENT:  Head: Normocephalic and atraumatic.  Eyes: Pupils are equal, round, and reactive to light.  Neck: Normal range of motion.  Abdominal: Soft. Bowel sounds are normal. There is tenderness. There is no rebound and no guarding.   Mild RUQ tenderness  Musculoskeletal: Normal range of motion.  Neurological: He is alert and oriented to person, place, and time.  Skin: Skin is warm and dry.  Psychiatric: He has a normal mood and affect. His behavior is normal. Judgment and thought content normal.    ED Course  Procedures (including critical care time) Labs Review Labs Reviewed  CBC WITH DIFFERENTIAL - Abnormal; Notable for the following:    HCT 38.8 (*)    All other components within normal limits  COMPREHENSIVE METABOLIC PANEL - Abnormal; Notable for the following:    Glucose, Bld 179 (*)    All other components within normal limits  LIPASE, BLOOD  URINALYSIS, ROUTINE W REFLEX MICROSCOPIC   Imaging Review No results found.  MDM   1. Gastritis    Epigastric and RUQ pain. Has been taking NSAIDS and drinking alcohol. Pt in no distress, no peritoneal signs. CBC, Comp metabolic panel and lipase unremarkable. He reports improvement in symptoms after pepcid and percocet here in ER. Probable gastritis. Plan discussed with pt and agrees. Will follow up with [facility] for evaluation of gallbladder.       Elisha Headland, NP 03/10/13 2010

## 2013-03-10 NOTE — ED Provider Notes (Signed)
Medical screening examination/treatment/procedure(s) were conducted as a shared visit with non-physician practitioner(s) and myself.  I personally evaluated the patient during the encounter  Please see my separate respective documentation pertaining to this patient encounter   Johnna Acosta, MD 03/10/13 2114

## 2013-03-30 ENCOUNTER — Encounter (HOSPITAL_COMMUNITY): Payer: Self-pay | Admitting: Emergency Medicine

## 2013-03-30 ENCOUNTER — Emergency Department (HOSPITAL_COMMUNITY)
Admission: EM | Admit: 2013-03-30 | Discharge: 2013-03-30 | Disposition: A | Discharge status: Home / Self Care | Payer: Self-pay | Attending: Emergency Medicine | Admitting: Emergency Medicine

## 2013-03-30 ENCOUNTER — Emergency Department (HOSPITAL_COMMUNITY): Payer: Self-pay

## 2013-03-30 DIAGNOSIS — Z8719 Personal history of other diseases of the digestive system: Secondary | ICD-10-CM | POA: Insufficient documentation

## 2013-03-30 DIAGNOSIS — E119 Type 2 diabetes mellitus without complications: Secondary | ICD-10-CM | POA: Insufficient documentation

## 2013-03-30 DIAGNOSIS — Z791 Long term (current) use of non-steroidal anti-inflammatories (NSAID): Secondary | ICD-10-CM | POA: Insufficient documentation

## 2013-03-30 DIAGNOSIS — F17200 Nicotine dependence, unspecified, uncomplicated: Secondary | ICD-10-CM | POA: Insufficient documentation

## 2013-03-30 DIAGNOSIS — Z79899 Other long term (current) drug therapy: Secondary | ICD-10-CM | POA: Insufficient documentation

## 2013-03-30 DIAGNOSIS — R1011 Right upper quadrant pain: Secondary | ICD-10-CM | POA: Insufficient documentation

## 2013-03-30 DIAGNOSIS — I10 Essential (primary) hypertension: Secondary | ICD-10-CM | POA: Insufficient documentation

## 2013-03-30 DIAGNOSIS — R1031 Right lower quadrant pain: Secondary | ICD-10-CM | POA: Insufficient documentation

## 2013-03-30 DIAGNOSIS — R112 Nausea with vomiting, unspecified: Secondary | ICD-10-CM | POA: Insufficient documentation

## 2013-03-30 DIAGNOSIS — G5790 Unspecified mononeuropathy of unspecified lower limb: Secondary | ICD-10-CM | POA: Insufficient documentation

## 2013-03-30 DIAGNOSIS — R109 Unspecified abdominal pain: Secondary | ICD-10-CM

## 2013-03-30 LAB — URINALYSIS, ROUTINE W REFLEX MICROSCOPIC
Glucose, UA: NEGATIVE mg/dL
Leukocytes, UA: NEGATIVE
Protein, ur: NEGATIVE mg/dL
Specific Gravity, Urine: 1.013 (ref 1.005–1.030)
Urobilinogen, UA: 0.2 mg/dL (ref 0.0–1.0)

## 2013-03-30 LAB — CBC WITH DIFFERENTIAL/PLATELET
Basophils Absolute: 0 10*3/uL (ref 0.0–0.1)
Basophils Relative: 0 % (ref 0–1)
Eosinophils Absolute: 0.2 10*3/uL (ref 0.0–0.7)
Eosinophils Relative: 2 % (ref 0–5)
HCT: 38.5 % — ABNORMAL LOW (ref 39.0–52.0)
Lymphocytes Relative: 33 % (ref 12–46)
MCH: 29.9 pg (ref 26.0–34.0)
MCHC: 34.8 g/dL (ref 30.0–36.0)
MCV: 85.9 fL (ref 78.0–100.0)
Monocytes Absolute: 0.9 10*3/uL (ref 0.1–1.0)
Monocytes Relative: 9 % (ref 3–12)
Platelets: 261 10*3/uL (ref 150–400)
RDW: 13.4 % (ref 11.5–15.5)

## 2013-03-30 LAB — COMPREHENSIVE METABOLIC PANEL
AST: 22 U/L (ref 0–37)
BUN: 11 mg/dL (ref 6–23)
CO2: 27 mEq/L (ref 19–32)
Calcium: 9.9 mg/dL (ref 8.4–10.5)
Creatinine, Ser: 0.91 mg/dL (ref 0.50–1.35)
GFR calc non Af Amer: >90 mL/min (ref >90)
Total Protein: 8.7 g/dL — ABNORMAL HIGH (ref 6.0–8.3)

## 2013-03-30 LAB — URINE MICROSCOPIC-ADD ON

## 2013-03-30 MED ORDER — IOHEXOL 300 MG/ML  SOLN
20.0000 mL | INTRAMUSCULAR | Status: AC
  Administered 2013-03-30: 25 mL via ORAL

## 2013-03-30 MED ORDER — OXYCODONE-ACETAMINOPHEN 5-325 MG PO TABS
1.0000 | ORAL_TABLET | ORAL | Status: DC | PRN
  Administered 2013-03-30: 1 via ORAL
  Filled 2013-03-30: qty 1

## 2013-03-30 MED ORDER — OXYCODONE-ACETAMINOPHEN 5-325 MG PO TABS: 1.0000 | ORAL_TABLET | ORAL | Status: DC | PRN

## 2013-03-30 MED ORDER — IOHEXOL 300 MG/ML  SOLN
100.0000 mL | Freq: Once | INTRAMUSCULAR | Status: AC | PRN
  Administered 2013-03-30: 100 mL via INTRAVENOUS

## 2013-03-30 NOTE — ED Notes (Signed)
Pt presents to department for evaluation of lower abdominal pain and nausea. Ongoing x3 days. Last BM today was normal. 8/10 pain at the time. Pt is alert and oriented x4. NAD.

## 2013-03-30 NOTE — Progress Notes (Addendum)
ED CM in to meet with patient. Pt presented to ED with Abdominal Pains x 3 weeks. Pt has had 3 visits to ED in last 6 months. Pt states, he does not have a PCP, or health insurance. Discussed the benefits and the importance of having PCP, and not utilizing the ED for primary care needs. Provided verbal and written information on the [facility], and [facility] card. Pt verbalized understanding and in agreement with plan.Explained I will forward information to Prudencio Pair Appt. Scheduler at [facility] for follow up. Pt agrees with  plan. [facility] should contact patient within 3-5 days., patient instructed to reach out to clinic, if he is not contacted. Verified current phone number. No further CM needs identified.

## 2013-03-30 NOTE — ED Notes (Signed)
PT states unable to F/U with [facility] due to lack of funds

## 2013-03-30 NOTE — ED Provider Notes (Signed)
CSN: [id number]     Arrival date & time 03/30/13  1520 History   First MD Initiated Contact with Patient 03/30/13 1635     Chief Complaint  Patient presents with  . Abdominal Pain   (Consider location/radiation/quality/duration/timing/severity/associated sxs/prior Treatment) Patient is a 42 y.o. male presenting with abdominal pain.  Abdominal Pain Associated symptoms: nausea and vomiting (over the weekend)   Associated symptoms: no chest pain, no chills, no constipation, no diarrhea, no dysuria, no fever and no shortness of breath    Patient is a 42 yo male with a history of pancreatitis and chronic upper abdominal pain who presents with gradual onset bilateral lower abdominal pain R>L. States 3-4 days ago this started. Sharp, crampy, achey pain. 8/10. States he feels like it is his appendix. Patient notes still has appendix and gall bladder. Notes some nausea. No vomiting today, but did have vomiting over the weekend and spit up a small amount of blood. Notes normal BMs. Nothing improves this pain and nothing worsens this pain. Denies chest pain, shortness of breath, cough, weight loss, bloody BMs, hernias, difficulty urinating. Notably patient had CT abd/pelvis 12/28/12 that revealed 1.1 cm lesion in inferior right hepatic lobe.  Past Medical History  Diagnosis Date  . Diabetes mellitus   . Hypertension   . Neuropathy of lower extremity   . Pancreatitis 06/2012   History reviewed. No pertinent past surgical history. Family History  Problem Relation Age of Onset  . Diabetes Mother   . Diabetes Maternal Aunt    History  Substance Use Topics  . Smoking status: Current Every Day Smoker -- 0.25 packs/day    Types: Cigarettes  . Smokeless tobacco: Not on file  . Alcohol Use: No    Review of Systems  Constitutional: Negative for fever and chills.  Respiratory: Negative for chest tightness and shortness of breath.   Cardiovascular: Negative for chest pain.  Gastrointestinal:  Positive for nausea, vomiting (over the weekend) and abdominal pain. Negative for diarrhea, constipation, blood in stool and abdominal distention.  Genitourinary: Negative for dysuria and difficulty urinating.    Allergies  Morphine and related  Home Medications   Current Outpatient Rx  Name  Route  Sig  Dispense  Refill  . gabapentin (NEURONTIN) 300 MG capsule   Oral   Take 300 mg by mouth 3 (three) times daily.         Marland Kitchen glipiZIDE (GLUCOTROL) 10 MG tablet   Oral   Take 10 mg by mouth daily.         Marland Kitchen lisinopril-hydrochlorothiazide (PRINZIDE,ZESTORETIC) 20-12.5 MG per tablet   Oral   Take 1 tablet by mouth daily.         . metFORMIN (GLUCOPHAGE) 1000 MG tablet   Oral   Take 1,000 mg by mouth 2 (two) times daily with a meal.           . metoprolol (LOPRESSOR) 50 MG tablet   Oral   Take 50 mg by mouth 2 (two) times daily.         . naproxen (NAPROSYN) 250 MG tablet   Oral   Take 250 mg by mouth 2 (two) times daily with a meal.         . omeprazole (PRILOSEC) 20 MG capsule   Oral   Take 20 mg by mouth daily as needed (Acid reflux).          . traMADol (ULTRAM) 50 MG tablet   Oral   Take 50 mg by  mouth every 6 (six) hours as needed for pain.          BP 105/68  Pulse 89  Temp(Src) 98.5 F (36.9 C) (Oral)  Resp 18  Ht 5\' 3"  (1.6 m)  Wt 130 lb (58.968 kg)  BMI 23.03 kg/m2  SpO2 99% Physical Exam  Constitutional: He appears well-developed and well-nourished. No distress.  HENT:  Head: Normocephalic and atraumatic.  Mouth/Throat: Oropharynx is clear and moist.  Eyes: Pupils are equal, round, and reactive to light.  Neck: Neck supple.  Cardiovascular: Normal rate, regular rhythm and normal heart sounds.   Pulmonary/Chest: Effort normal and breath sounds normal.  Abdominal: Soft. Bowel sounds are normal. He exhibits no distension and no mass. There is tenderness (RLQ and RUQ). There is no rebound and no guarding.  No mcburney's point tenderness,  negative murphy sign, no apparent abdominal hernias  Genitourinary:  No apparent hernias  Musculoskeletal: He exhibits no edema.  Neurological: He is alert.  Skin: Skin is warm and dry. He is not diaphoretic.    ED Course  Procedures (including critical care time) Labs Review Labs Reviewed  CBC WITH DIFFERENTIAL - Abnormal; Notable for the following:    HCT 38.5 (*)    All other components within normal limits  COMPREHENSIVE METABOLIC PANEL - Abnormal; Notable for the following:    Glucose, Bld 115 (*)    Total Protein 8.7 (*)    All other components within normal limits  URINALYSIS, ROUTINE W REFLEX MICROSCOPIC - Abnormal; Notable for the following:    APPearance CLOUDY (*)    Hgb urine dipstick TRACE (*)    All other components within normal limits  LIPASE, BLOOD  URINE MICROSCOPIC-ADD ON   Imaging Review Ct Abdomen Pelvis W Contrast  03/30/2013   CLINICAL DATA:  Patient reports history of pancreatitis, but now having low abdominal and pelvic pain  EXAM: CT ABDOMEN AND PELVIS WITH CONTRAST  TECHNIQUE: Multidetector CT imaging of the abdomen and pelvis was performed using the standard protocol following bolus administration of intravenous contrast.  CONTRAST:  136mL OMNIPAQUE IOHEXOL 300 MG/ML  SOLN  COMPARISON:  12/28/2012  FINDINGS: Again identified is mild diffuse fatty infiltration of the liver. There is also again noted a hyperenhancing 10 mm mass in the anterior inferior right lobe of the liver. It appears more hyper attenuating today than it was previously, but is unchanged in size. Gallbladder is normal. Spleen is normal. Pancreas is normal. Adrenal glands and kidneys are normal.  The bladder is distended but otherwise normal. Reproductive organs are normal. There is no free fluid. Bowel is normal except for mild diverticulosis of the sigmoid colon.  There are no significant musculoskeletal abnormalities. Appendix is not identified.  IMPRESSION: No acute abnormalities. Again  identified is an indeterminate mass in the anterior inferior right lobe of the liver. It is unchanged in size, and the fact that it is somewhat more hyper attenuating on initial images today is likely due to slightly different phase of enhancement. As described on the prior study, possible etiologies would include a hemangioma, and adenoma, or focal nodular hyperplasia. If indicated, further evaluation with MRI could be considered.   Electronically Signed   By: Skipper Cliche M.D.   On: 03/30/2013 19:13    EKG Interpretation   None       MDM   1. Abdominal pain    5:00 pm: patient seen and examined. Notes lower abdominal pain R>L. Is tender on right lower quadrant side without  guarding or rebound. No mcburney point tenderness. Notes RUQ pain as well. Has had imaging in the past that did not note gall bladder etiology or appendiceal etiology. Will treat current pain with percocet. Given RLQ pain will obtain CT abd/pelvis to rule out appendiceal etiology. Seems unlikely with fever or elevated white count. Also not tender at mcburney's point.  Patient CT scan of abdomen revealed no acute abnormalities to give cause to abdominal pain. Suspect that pain is related to gas vs abdominal cramp vs gastroparesis in patient with DM. Given no acute abnormalities on CT scan and normal labs do not suspect this is related to appendix or gall bladder. Discussed this with the patient. Also discussed that the CT scan of his liver showed a cyst like mass. The patient dose not have a PCP at this time and discussed with him that he needs close follow-up with a PCP given he has had recurrent abdominal pain without cause noted multiple imaging studies of his abdomen. Patient was to follow-up with general surgery following last ED visit though could not afford this. Patient was given percocet #10 for pain control and advised to follow-up with one of the doctors on the resource list provided to the patient. Patient given  return precautions. Patient stated understanding of this.  This patient was discussed and seen with my attending Dr Reather Converse.  Tommi Rumps, MD [place] PGY-2 03/30/13 11:47 pm    Leone Haven, MD 03/30/13 2350  Medical screening examination/treatment/procedure(s) were conducted as a shared visit with non-physician practitioner(s) or resident  and myself.  I personally evaluated the patient during the encounter and agree with the findings and plan unless otherwise indicated.    Recurrent abd pain, pt feels this is different, worse lower.  Exam mild diffuse pain, worse RLQ.  CT abd to rule out appy.  Pt improved in ED.  Vitals normal.  Fup discussed.    Abd pain  Mariea Clonts, MD 03/31/13 [phone]

## 2013-04-09 ENCOUNTER — Ambulatory Visit: Payer: Self-pay

## 2013-04-14 ENCOUNTER — Ambulatory Visit: Payer: Self-pay

## 2013-04-22 ENCOUNTER — Ambulatory Visit: Payer: Self-pay

## 2013-04-26 ENCOUNTER — Encounter (HOSPITAL_COMMUNITY): Payer: Self-pay | Admitting: Emergency Medicine

## 2013-04-26 ENCOUNTER — Emergency Department (HOSPITAL_COMMUNITY)
Admission: EM | Admit: 2013-04-26 | Discharge: 2013-04-26 | Disposition: A | Discharge status: Home / Self Care | Payer: Self-pay | Attending: Emergency Medicine | Admitting: Emergency Medicine

## 2013-04-26 DIAGNOSIS — R1084 Generalized abdominal pain: Secondary | ICD-10-CM | POA: Insufficient documentation

## 2013-04-26 DIAGNOSIS — F17200 Nicotine dependence, unspecified, uncomplicated: Secondary | ICD-10-CM | POA: Insufficient documentation

## 2013-04-26 DIAGNOSIS — Z885 Allergy status to narcotic agent status: Secondary | ICD-10-CM | POA: Insufficient documentation

## 2013-04-26 DIAGNOSIS — Z79899 Other long term (current) drug therapy: Secondary | ICD-10-CM | POA: Insufficient documentation

## 2013-04-26 DIAGNOSIS — I10 Essential (primary) hypertension: Secondary | ICD-10-CM | POA: Insufficient documentation

## 2013-04-26 DIAGNOSIS — E119 Type 2 diabetes mellitus without complications: Secondary | ICD-10-CM | POA: Insufficient documentation

## 2013-04-26 DIAGNOSIS — Z8719 Personal history of other diseases of the digestive system: Secondary | ICD-10-CM | POA: Insufficient documentation

## 2013-04-26 DIAGNOSIS — Z8669 Personal history of other diseases of the nervous system and sense organs: Secondary | ICD-10-CM | POA: Insufficient documentation

## 2013-04-26 DIAGNOSIS — G8929 Other chronic pain: Secondary | ICD-10-CM | POA: Insufficient documentation

## 2013-04-26 DIAGNOSIS — R000 Tachycardia, unspecified: Secondary | ICD-10-CM | POA: Insufficient documentation

## 2013-04-26 LAB — CBC WITH DIFFERENTIAL/PLATELET
Basophils Absolute: 0 10*3/uL (ref 0.0–0.1)
Basophils Relative: 0 % (ref 0–1)
Eosinophils Relative: 1 % (ref 0–5)
HCT: 39.1 % (ref 39.0–52.0)
Lymphs Abs: 2.3 10*3/uL (ref 0.7–4.0)
MCHC: 34 g/dL (ref 30.0–36.0)
MCV: 87.3 fL (ref 78.0–100.0)
Monocytes Absolute: 1 10*3/uL (ref 0.1–1.0)
Neutro Abs: 5.3 10*3/uL (ref 1.7–7.7)
Platelets: 226 10*3/uL (ref 150–400)
RBC: 4.48 MIL/uL (ref 4.22–5.81)
RDW: 13.1 % (ref 11.5–15.5)
WBC: 8.7 10*3/uL (ref 4.0–10.5)

## 2013-04-26 LAB — COMPREHENSIVE METABOLIC PANEL
AST: 22 U/L (ref 0–37)
Albumin: 3.8 g/dL (ref 3.5–5.2)
BUN: 10 mg/dL (ref 6–23)
CO2: 27 mEq/L (ref 19–32)
Calcium: 9.4 mg/dL (ref 8.4–10.5)
Chloride: 98 mEq/L (ref 96–112)
Creatinine, Ser: 0.73 mg/dL (ref 0.50–1.35)
GFR calc non Af Amer: >90 mL/min (ref >90)
Sodium: 136 mEq/L (ref 135–145)
Total Protein: 8.3 g/dL (ref 6.0–8.3)

## 2013-04-26 LAB — POCT I-STAT TROPONIN I: Troponin i, poc: 0.01 ng/mL (ref 0.00–0.08)

## 2013-04-26 MED ORDER — OXYCODONE-ACETAMINOPHEN 5-325 MG PO TABS
1.0000 | ORAL_TABLET | Freq: Once | ORAL | Status: AC
  Administered 2013-04-26: 1 via ORAL
  Filled 2013-04-26: qty 1

## 2013-04-26 NOTE — ED Notes (Signed)
Pt states coughed up a little blood today once only

## 2013-04-26 NOTE — ED Notes (Signed)
Pt is here with upper abdominal pain intermittently and increased last nite.  Pt denies chest pain or sob.  Pt denies constipation or vomiting, but reports some diarrhea

## 2013-04-27 NOTE — ED Provider Notes (Signed)
CSN: [id number]     Arrival date & time 04/26/13  1251 History   First MD Initiated Contact with Patient 04/26/13 1704     Chief Complaint  Patient presents with  . Abdominal Pain   (Consider location/radiation/quality/duration/timing/severity/associated sxs/prior Treatment) The history is provided by the patient. No language interpreter was used.   Patient is a 42 year old African American male with past medical history of abdominal pain present for several months. He has been evaluated multiple times the emergency department urgent care facilities abdominal pain. He has been provided with followup with GI doctors is currently obtaining a followup. He comes emergency department today because his abdominal pain has become acutely worse over the past 48 hours. He normally takes Percocet for his pain but ran out approximately a week ago. Currently rates the pain at an 8/10. He has not had any vomiting, diarrhea, fevers, chills, dysuria, frequency, or urgency. He's had multiple CT scans in the past which have been unremarkable. I have showed cysts an incidental finding on his liver. Patient is concerned that he may have cancer. He comes emergency department if this feels like increasing pain maybe he related to cancer.  He has had no night sweats and no weight changes.  Past Medical History  Diagnosis Date  . Diabetes mellitus   . Hypertension   . Neuropathy of lower extremity   . Pancreatitis 06/2012   History reviewed. No pertinent past surgical history. Family History  Problem Relation Age of Onset  . Diabetes Mother   . Diabetes Maternal Aunt    History  Substance Use Topics  . Smoking status: Current Every Day Smoker -- 0.25 packs/day    Types: Cigarettes  . Smokeless tobacco: Not on file  . Alcohol Use: No    Review of Systems  Constitutional: Negative for fever and chills.  Respiratory: Negative for cough and shortness of breath.   Gastrointestinal: Positive for abdominal  pain. Negative for nausea, vomiting, diarrhea, constipation and abdominal distention.  Genitourinary: Negative for dysuria, urgency and frequency.  Musculoskeletal: Negative for back pain.  All other systems reviewed and are negative.    Allergies  Morphine and related  Home Medications   Current Outpatient Rx  Name  Route  Sig  Dispense  Refill  . gabapentin (NEURONTIN) 300 MG capsule   Oral   Take 300 mg by mouth 3 (three) times daily.         Marland Kitchen glipiZIDE (GLUCOTROL) 10 MG tablet   Oral   Take 10 mg by mouth daily.         Marland Kitchen lisinopril-hydrochlorothiazide (PRINZIDE,ZESTORETIC) 20-12.5 MG per tablet   Oral   Take 1 tablet by mouth daily.         . metFORMIN (GLUCOPHAGE) 1000 MG tablet   Oral   Take 1,000 mg by mouth 2 (two) times daily with a meal.           . metoprolol (LOPRESSOR) 50 MG tablet   Oral   Take 50 mg by mouth 2 (two) times daily.         . naproxen (NAPROSYN) 250 MG tablet   Oral   Take 250 mg by mouth 2 (two) times daily as needed for moderate pain.          Marland Kitchen omeprazole (PRILOSEC) 20 MG capsule   Oral   Take 20 mg by mouth daily as needed (Acid reflux).          Marland Kitchen oxyCODONE-acetaminophen (PERCOCET/ROXICET) 5-325 MG per  tablet   Oral   Take 1 tablet by mouth every 4 (four) hours as needed.   10 tablet   0   . traMADol (ULTRAM) 50 MG tablet   Oral   Take 50 mg by mouth every 6 (six) hours as needed for pain.          BP 154/86  Pulse 86  Temp(Src) 98.2 F (36.8 C) (Oral)  Resp 16  Wt 137 lb 6 oz (62.313 kg)  SpO2 100% Physical Exam  Nursing note and vitals reviewed. Constitutional: He is oriented to person, place, and time. He appears well-developed and well-nourished. No distress.  HENT:  Head: Normocephalic and atraumatic.  Eyes: Pupils are equal, round, and reactive to light.  Neck: Normal range of motion.  Cardiovascular: Normal rate, regular rhythm and normal heart sounds.   Pulmonary/Chest: Effort normal and  breath sounds normal. No respiratory distress. He has no decreased breath sounds. He has no wheezes. He has no rhonchi. He has no rales.  Abdominal: Soft. He exhibits no distension. There is generalized tenderness. There is no rigidity, no rebound, no guarding, no tenderness at McBurney's point and negative Murphy's sign.  Musculoskeletal: He exhibits no edema and no tenderness.  Neurological: He is alert and oriented to person, place, and time. He exhibits normal muscle tone.  Skin: Skin is warm and dry.    ED Course  Procedures (including critical care time) Labs Review Labs Reviewed  COMPREHENSIVE METABOLIC PANEL - Abnormal; Notable for the following:    Glucose, Bld 206 (*)    All other components within normal limits  CBC WITH DIFFERENTIAL  LIPASE, BLOOD  POCT I-STAT TROPONIN I   Imaging Review No results found.  EKG Interpretation     Ventricular Rate:  102 PR Interval:  140 QRS Duration: 80 QT Interval:  336 QTC Calculation: 437 R Axis:   50 Text Interpretation:  Sinus tachycardia Nonspecific T wave abnormality Abnormal ECG            MDM  Patient is a 42 year old African American male with past medical history of diabetes and chronic abdominal pain and comes emergency department today with worsening abdominal pain. Physical exam as above. Initial workup included a troponin, CBC, CMP, lipase, and EKG. With benign abdominal exam and multiple CT scans in the past which were unremarkable patient was not felt to require CT that time. With no right upper quadrant tenderness doubt an acute cholecystitis. CBC was unremarkable. CMP was unremarkable. Lipase is 29. I-STAT troponin was negative. EKG as above. With no exertional component to the pain and no shortness of breath doubt MI or ACS.Marland Kitchen Likely due to patient's acute abdominal pain which has been evaluated for frequent emergency department. He is already obtaining GI followup. Patient was not felt to require further  evaluation for his pain in the emergency department this time. He was instructed to maintain his followup with GI. Instructed to return to emergency department if he develops worsening pain, fevers, chills, or any other concerns. Labs reviewed by myself and considered and medical decision-making. Care was discussed with attending Dr. Reather Converse.    1. Chronic abdominal pain      Katheren Shams, MD 04/27/13 [phone]

## 2013-04-27 NOTE — ED Provider Notes (Signed)
Medical screening examination/treatment/procedure(s) were conducted as a shared visit with non-physician practitioner(s) or resident and myself. I personally evaluated the patient during the encounter and agree with the findings and plan unless otherwise indicated.  I have personally reviewed any xrays and/ or EKG's with the provider and I agree with interpretation.  Recurrent epig pain. No cp or sob. Recent CT no acute findings, pt has fup with GI. Pain intermittent for months. Worse with eating. On exam mild epig pain, no guarding, MMM, mild tachy. PO meds in ED.  Close fup stressed. Labs unremarkable, benign exam.  Epig pain, DM   Mariea Clonts, MD 04/27/13 [phone]

## 2013-04-30 ENCOUNTER — Ambulatory Visit: Payer: Self-pay

## 2013-10-07 ENCOUNTER — Encounter (HOSPITAL_COMMUNITY): Payer: Self-pay | Admitting: Emergency Medicine

## 2013-10-07 ENCOUNTER — Emergency Department (HOSPITAL_COMMUNITY): Payer: Self-pay

## 2013-10-07 ENCOUNTER — Emergency Department (HOSPITAL_COMMUNITY)
Admission: EM | Admit: 2013-10-07 | Discharge: 2013-10-07 | Disposition: A | Discharge status: Home / Self Care | Payer: Self-pay | Attending: Emergency Medicine | Admitting: Emergency Medicine

## 2013-10-07 DIAGNOSIS — G589 Mononeuropathy, unspecified: Secondary | ICD-10-CM | POA: Insufficient documentation

## 2013-10-07 DIAGNOSIS — R110 Nausea: Secondary | ICD-10-CM | POA: Insufficient documentation

## 2013-10-07 DIAGNOSIS — R109 Unspecified abdominal pain: Secondary | ICD-10-CM

## 2013-10-07 DIAGNOSIS — E119 Type 2 diabetes mellitus without complications: Secondary | ICD-10-CM | POA: Insufficient documentation

## 2013-10-07 DIAGNOSIS — Z79899 Other long term (current) drug therapy: Secondary | ICD-10-CM | POA: Insufficient documentation

## 2013-10-07 DIAGNOSIS — R1011 Right upper quadrant pain: Secondary | ICD-10-CM | POA: Insufficient documentation

## 2013-10-07 DIAGNOSIS — I10 Essential (primary) hypertension: Secondary | ICD-10-CM | POA: Insufficient documentation

## 2013-10-07 DIAGNOSIS — Z8719 Personal history of other diseases of the digestive system: Secondary | ICD-10-CM | POA: Insufficient documentation

## 2013-10-07 DIAGNOSIS — F17200 Nicotine dependence, unspecified, uncomplicated: Secondary | ICD-10-CM | POA: Insufficient documentation

## 2013-10-07 DIAGNOSIS — F411 Generalized anxiety disorder: Secondary | ICD-10-CM | POA: Insufficient documentation

## 2013-10-07 LAB — I-STAT TROPONIN, ED
TROPONIN I, POC: 0 ng/mL (ref 0.00–0.08)
Troponin i, poc: 0 ng/mL (ref 0.00–0.08)

## 2013-10-07 LAB — CBC WITH DIFFERENTIAL/PLATELET
Basophils Absolute: 0 10*3/uL (ref 0.0–0.1)
Basophils Relative: 0 % (ref 0–1)
Eosinophils Absolute: 0.2 10*3/uL (ref 0.0–0.7)
Eosinophils Relative: 2 % (ref 0–5)
HCT: 40.5 % (ref 39.0–52.0)
HEMOGLOBIN: 13.5 g/dL (ref 13.0–17.0)
LYMPHS ABS: 2.9 10*3/uL (ref 0.7–4.0)
Lymphocytes Relative: 33 % (ref 12–46)
MCH: 28.8 pg (ref 26.0–34.0)
MCHC: 33.3 g/dL (ref 30.0–36.0)
MCV: 86.5 fL (ref 78.0–100.0)
MONOS PCT: 8 % (ref 3–12)
Monocytes Absolute: 0.7 10*3/uL (ref 0.1–1.0)
NEUTROS PCT: 57 % (ref 43–77)
Neutro Abs: 5.1 10*3/uL (ref 1.7–7.7)
Platelets: 213 10*3/uL (ref 150–400)
RBC: 4.68 MIL/uL (ref 4.22–5.81)
RDW: 13.4 % (ref 11.5–15.5)
WBC: 8.9 10*3/uL (ref 4.0–10.5)

## 2013-10-07 LAB — URINALYSIS, ROUTINE W REFLEX MICROSCOPIC
BILIRUBIN URINE: NEGATIVE
KETONES UR: NEGATIVE mg/dL
Leukocytes, UA: NEGATIVE
Nitrite: NEGATIVE
PH: 5 (ref 5.0–8.0)
PROTEIN: NEGATIVE mg/dL
Specific Gravity, Urine: 1.027 (ref 1.005–1.030)
Urobilinogen, UA: 0.2 mg/dL (ref 0.0–1.0)

## 2013-10-07 LAB — COMPREHENSIVE METABOLIC PANEL
ALK PHOS: 94 U/L (ref 39–117)
ALT: 19 U/L (ref 0–53)
AST: 20 U/L (ref 0–37)
Albumin: 3.9 g/dL (ref 3.5–5.2)
BUN: 12 mg/dL (ref 6–23)
CO2: 23 meq/L (ref 19–32)
Calcium: 9.5 mg/dL (ref 8.4–10.5)
Chloride: 99 mEq/L (ref 96–112)
Creatinine, Ser: 0.67 mg/dL (ref 0.50–1.35)
GLUCOSE: 229 mg/dL — AB (ref 70–99)
POTASSIUM: 4 meq/L (ref 3.7–5.3)
Sodium: 137 mEq/L (ref 137–147)
Total Bilirubin: 0.3 mg/dL (ref 0.3–1.2)
Total Protein: 8.1 g/dL (ref 6.0–8.3)

## 2013-10-07 LAB — URINE MICROSCOPIC-ADD ON

## 2013-10-07 LAB — CBG MONITORING, ED: GLUCOSE-CAPILLARY: 104 mg/dL — AB (ref 70–99)

## 2013-10-07 LAB — POC OCCULT BLOOD, ED: Fecal Occult Bld: NEGATIVE

## 2013-10-07 LAB — LIPASE, BLOOD: Lipase: 42 U/L (ref 11–59)

## 2013-10-07 MED ORDER — TRAMADOL HCL 50 MG PO TABS
50.0000 mg | ORAL_TABLET | Freq: Once | ORAL | Status: AC
  Administered 2013-10-07: 50 mg via ORAL
  Filled 2013-10-07: qty 1

## 2013-10-07 MED ORDER — IOHEXOL 300 MG/ML  SOLN
100.0000 mL | Freq: Once | INTRAMUSCULAR | Status: AC | PRN
  Administered 2013-10-07: 100 mL via INTRAVENOUS

## 2013-10-07 MED ORDER — IOHEXOL 300 MG/ML  SOLN
25.0000 mL | Freq: Once | INTRAMUSCULAR | Status: AC | PRN
  Administered 2013-10-07: 25 mL via ORAL

## 2013-10-07 MED ORDER — ONDANSETRON HCL 4 MG/2ML IJ SOLN
4.0000 mg | Freq: Once | INTRAMUSCULAR | Status: AC
  Administered 2013-10-07: 4 mg via INTRAVENOUS
  Filled 2013-10-07: qty 2

## 2013-10-07 MED ORDER — METOCLOPRAMIDE HCL 10 MG PO TABS: 10.0000 mg | ORAL_TABLET | Freq: Four times a day (QID) | ORAL | Status: DC | PRN

## 2013-10-07 MED ORDER — SODIUM CHLORIDE 0.9 % IV BOLUS (SEPSIS)
1000.0000 mL | Freq: Once | INTRAVENOUS | Status: AC
  Administered 2013-10-07: 1000 mL via INTRAVENOUS

## 2013-10-07 NOTE — ED Provider Notes (Signed)
CSN: [id number]     Arrival date & time 10/07/13  1212 History   First MD Initiated Contact with Patient 10/07/13 1318     Chief Complaint  Patient presents with  . Abdominal Pain  . Nausea     (Consider location/radiation/quality/duration/timing/severity/associated sxs/prior Treatment) The history is provided by the patient.  Christopher Davila is a 43 y.o. male hx of DM, HTN, pancreatitis here with epigastric pain, nausea. Epigastric pain and nausea has been chronic. Has been worsening over the last month or two. He feels nauseated and was unable to eat much. Worse pain when he eats. Has been having some black stools but worse for the last several days. Denies any blood in his stools. He also notes unintentional weight loss of 30 pounds.   Upon chart review, he was here in November last year. He had CT that time that showed liver hemangioma. He had been previously diagnosed with chronic abdominal pain. At that time he was also concerned about cancer.   Past Medical History  Diagnosis Date  . Diabetes mellitus   . Hypertension   . Neuropathy of lower extremity   . Pancreatitis 06/2012   History reviewed. No pertinent past surgical history. Family History  Problem Relation Age of Onset  . Diabetes Mother   . Diabetes Maternal Aunt    History  Substance Use Topics  . Smoking status: Current Every Day Smoker -- 0.25 packs/day    Types: Cigarettes  . Smokeless tobacco: Not on file  . Alcohol Use: No    Review of Systems  Gastrointestinal: Positive for nausea and abdominal pain.  All other systems reviewed and are negative.     Allergies  Morphine and related  Home Medications   Prior to Admission medications   Medication Sig Start Date End Date Taking? Authorizing Provider  AMLODIPINE BESYLATE PO Take 1 tablet by mouth daily.   Yes Historical Provider, MD  gabapentin (NEURONTIN) 300 MG capsule Take 300 mg by mouth 3 (three) times daily.   Yes Historical Provider, MD   glipiZIDE (GLUCOTROL) 10 MG tablet Take 10 mg by mouth daily.   Yes Historical Provider, MD  lisinopril-hydrochlorothiazide (PRINZIDE,ZESTORETIC) 20-12.5 MG per tablet Take 1 tablet by mouth daily.   Yes Historical Provider, MD  metFORMIN (GLUCOPHAGE) 1000 MG tablet Take 1,000 mg by mouth 2 (two) times daily with a meal.     Yes Historical Provider, MD  metoprolol (LOPRESSOR) 50 MG tablet Take 50 mg by mouth 2 (two) times daily.   Yes Historical Provider, MD  naproxen (NAPROSYN) 250 MG tablet Take 250 mg by mouth 2 (two) times daily as needed for moderate pain.    Yes Historical Provider, MD   BP 129/89  Pulse 73  Temp(Src) 98.3 F (36.8 C) (Oral)  Resp 18  Wt 150 lb (68.04 kg)  SpO2 99% Physical Exam  Nursing note and vitals reviewed. Constitutional: He is oriented to person, place, and time. He appears well-developed.  Anxious   HENT:  Head: Normocephalic.  Mouth/Throat: Oropharynx is clear and moist.  Eyes: Conjunctivae are normal. Pupils are equal, round, and reactive to light.  Neck: Normal range of motion. Neck supple.  Cardiovascular: Normal rate, regular rhythm and normal heart sounds.   Pulmonary/Chest: Effort normal and breath sounds normal. No respiratory distress. He has no wheezes. He has no rales.  Abdominal: Soft. Bowel sounds are normal.  Mild RUQ tenderness, no rebound. No murphy's. No CVAT   Musculoskeletal: Normal range of motion. He exhibits  no edema and no tenderness.  Neurological: He is alert and oriented to person, place, and time. No cranial nerve deficit. Coordination normal.  Skin: Skin is warm and dry.  Psychiatric: He has a normal mood and affect. His behavior is normal. Judgment and thought content normal.    ED Course  Procedures (including critical care time)   EMERGENCY DEPARTMENT US GALLBLADDER INTERPRETATION "Study: Limited Ultrasound of the gallbladder and common bile duct."  INDICATIONS: Abdominal pain Indication: Multiple views of the  gallbladder and common bile duct are obtained with a  Multi-frequency probe."  PERFORMED BY:  Myself  IMAGES ARCHIVED?: Yes  FINDINGS: Gallstones absent, Gallbladder wall normal in thickness, Sonographic Murphy's sign absent and Common bile duct normal in size  LIMITATIONS: Body Habitus  INTERPRETATION: Normal  COMMENT:  No acute chole   Labs Review Labs Reviewed  COMPREHENSIVE METABOLIC PANEL - Abnormal; Notable for the following:    Glucose, Bld 229 (*)    All other components within normal limits  URINALYSIS, ROUTINE W REFLEX MICROSCOPIC - Abnormal; Notable for the following:    Glucose, UA >1000 (*)    Hgb urine dipstick SMALL (*)    All other components within normal limits  CBG MONITORING, ED - Abnormal; Notable for the following:    Glucose-Capillary 104 (*)    All other components within normal limits  CBC WITH DIFFERENTIAL  LIPASE, BLOOD  URINE MICROSCOPIC-ADD ON  Randolm Idol, ED  POC OCCULT BLOOD, ED  Randolm Idol, ED    Imaging Review Dg Chest 2 View  10/07/2013   CLINICAL DATA:  Abdominal pain and hypertension.  Chest pain.  EXAM: CHEST  2 VIEW  COMPARISON:  Two-view chest x-ray 07/08/2012.  FINDINGS: The lung volumes are somewhat low. The heart size is normal. No focal airspace disease is evident. The visualized soft tissues and bony thorax are unremarkable.  IMPRESSION: Negative two view chest x-ray.   Electronically Signed   By: Lawrence Santiago M.D.   On: 10/07/2013 14:23   Ct Abdomen Pelvis W Contrast  10/07/2013   CLINICAL DATA:  Upper abdominal pain, nausea  EXAM: CT ABDOMEN AND PELVIS WITH CONTRAST  TECHNIQUE: Multidetector CT imaging of the abdomen and pelvis was performed using the standard protocol following bolus administration of intravenous contrast.  CONTRAST:  167mL OMNIPAQUE IOHEXOL 300 MG/ML  SOLN  COMPARISON:  03/30/2013  FINDINGS: Lung bases clear. Normal heart size. No pericardial or pleural effusion. No hiatal hernia. Symmetric minor  gynecomastia noted.  Abdomen: Liver, gallbladder, biliary system, pancreas, spleen, adrenal glands, and kidneys are within normal limits for age and demonstrate no acute process.  Negative for bowel obstruction, dilatation, ileus, or free air.  No abdominal free fluid, fluid collection, hemorrhage, abscess, or adenopathy.  Atherosclerosis of the aorta without aneurysm. Normal appendix demonstrated containing contrast.  Pelvis: No pelvic free fluid, fluid collection, hemorrhage, abscess, adenopathy, inguinal abnormality, or hernia. Urinary bladder unremarkable.  No acute osseous finding.  IMPRESSION: No acute intra-abdominal or pelvic finding by CT.  Normal appendix.  Aortic atherosclerosis without aneurysm   Electronically Signed   By: Daryll Brod M.D.   On: 10/07/2013 15:55     EKG Interpretation   Date/Time:  Thursday October 07 2013 12:23:16 EDT Ventricular Rate:  84 PR Interval:  158 QRS Duration: 98 QT Interval:  358 QTC Calculation: 423 R Axis:   87 Text Interpretation:  Normal sinus rhythm Cannot rule out Anterior infarct  , age undetermined ST \\T \ T wave abnormality, consider  inferior ischemia  Abnormal ECG TWI new since previous  Confirmed by Carden Teel  MD, Davionna Blacksher ([id number])  on 10/07/2013 1:18:06 PM      MDM   Final diagnoses:  None    Christopher Davila is a 43 y.o. male here with epigastric pain, RUQ pain. Given previous liver hemangioma will repeat CT. Will get labs. EKG changed from previous but denies chest pain or shortness of breath. Will get delta troponin. Bedside US showed nl gallbladder. I am suspecting it may also be worsening of his chronic abdominal pain.   6:08 PM Delta troponin nl. CT showed no acute findings. Labs showed elevated blood sugar, came down to 100 after IVF. I am wondering if he has component of gastroparesis. Not nauseous now. Will d/c home on reglan. I stressed GI f/u.     Wandra Arthurs, MD 10/07/13 [phone]

## 2013-10-07 NOTE — ED Notes (Signed)
Pt is here with upper abdominal pain, nausea, and black stools since Sunday.

## 2013-10-07 NOTE — ED Notes (Signed)
Patient transported to CT 

## 2013-10-07 NOTE — ED Notes (Signed)
CBG is 104. Notified Nurse Artist Pais.

## 2013-10-07 NOTE — ED Notes (Signed)
EKG shown to Dr. Audie Pinto and pt has some changes, increased acuity, patient denies chest pain or sob

## 2013-10-07 NOTE — ED Notes (Signed)
CT notified pt finished contrast  

## 2013-10-07 NOTE — ED Notes (Signed)
Pt returned from CT °

## 2013-10-07 NOTE — Discharge Instructions (Signed)
You may have gastroparesis.   You need to see a gi doctor.   Take reglan to help your nausea.   Return to ER if you have vomiting, dehydration.

## 2014-01-20 ENCOUNTER — Emergency Department (HOSPITAL_COMMUNITY)
Admission: EM | Admit: 2014-01-20 | Discharge: 2014-01-20 | Disposition: A | Discharge status: Home / Self Care | Payer: Self-pay | Attending: Emergency Medicine | Admitting: Emergency Medicine

## 2014-01-20 ENCOUNTER — Encounter (HOSPITAL_COMMUNITY): Payer: Self-pay | Admitting: Emergency Medicine

## 2014-01-20 ENCOUNTER — Emergency Department (HOSPITAL_COMMUNITY): Payer: Self-pay

## 2014-01-20 DIAGNOSIS — Z79899 Other long term (current) drug therapy: Secondary | ICD-10-CM | POA: Insufficient documentation

## 2014-01-20 DIAGNOSIS — R1011 Right upper quadrant pain: Secondary | ICD-10-CM | POA: Insufficient documentation

## 2014-01-20 DIAGNOSIS — I10 Essential (primary) hypertension: Secondary | ICD-10-CM | POA: Insufficient documentation

## 2014-01-20 DIAGNOSIS — E119 Type 2 diabetes mellitus without complications: Secondary | ICD-10-CM | POA: Insufficient documentation

## 2014-01-20 DIAGNOSIS — Z8669 Personal history of other diseases of the nervous system and sense organs: Secondary | ICD-10-CM | POA: Insufficient documentation

## 2014-01-20 DIAGNOSIS — R1013 Epigastric pain: Secondary | ICD-10-CM | POA: Insufficient documentation

## 2014-01-20 DIAGNOSIS — K5900 Constipation, unspecified: Secondary | ICD-10-CM | POA: Insufficient documentation

## 2014-01-20 DIAGNOSIS — R112 Nausea with vomiting, unspecified: Secondary | ICD-10-CM | POA: Insufficient documentation

## 2014-01-20 DIAGNOSIS — F17200 Nicotine dependence, unspecified, uncomplicated: Secondary | ICD-10-CM | POA: Insufficient documentation

## 2014-01-20 LAB — URINE MICROSCOPIC-ADD ON

## 2014-01-20 LAB — CBC WITH DIFFERENTIAL/PLATELET
BASOS ABS: 0 10*3/uL (ref 0.0–0.1)
BASOS PCT: 0 % (ref 0–1)
EOS PCT: 2 % (ref 0–5)
Eosinophils Absolute: 0.2 10*3/uL (ref 0.0–0.7)
HCT: 36.6 % — ABNORMAL LOW (ref 39.0–52.0)
HEMOGLOBIN: 12.3 g/dL — AB (ref 13.0–17.0)
Lymphocytes Relative: 33 % (ref 12–46)
Lymphs Abs: 3 10*3/uL (ref 0.7–4.0)
MCH: 29 pg (ref 26.0–34.0)
MCHC: 33.6 g/dL (ref 30.0–36.0)
MCV: 86.3 fL (ref 78.0–100.0)
MONO ABS: 0.7 10*3/uL (ref 0.1–1.0)
MONOS PCT: 7 % (ref 3–12)
NEUTROS ABS: 5.4 10*3/uL (ref 1.7–7.7)
Neutrophils Relative %: 58 % (ref 43–77)
Platelets: 190 10*3/uL (ref 150–400)
RBC: 4.24 MIL/uL (ref 4.22–5.81)
RDW: 13.4 % (ref 11.5–15.5)
WBC: 9.3 10*3/uL (ref 4.0–10.5)

## 2014-01-20 LAB — BASIC METABOLIC PANEL
Anion gap: 14 (ref 5–15)
BUN: 12 mg/dL (ref 6–23)
CALCIUM: 9.1 mg/dL (ref 8.4–10.5)
CO2: 24 meq/L (ref 19–32)
Chloride: 103 mEq/L (ref 96–112)
Creatinine, Ser: 0.79 mg/dL (ref 0.50–1.35)
GFR calc Af Amer: >90 mL/min (ref >90)
GFR calc non Af Amer: >90 mL/min (ref >90)
GLUCOSE: 221 mg/dL — AB (ref 70–99)
Potassium: 4.1 mEq/L (ref 3.7–5.3)
SODIUM: 141 meq/L (ref 137–147)

## 2014-01-20 LAB — URINALYSIS, ROUTINE W REFLEX MICROSCOPIC
Bilirubin Urine: NEGATIVE
Ketones, ur: NEGATIVE mg/dL
Leukocytes, UA: NEGATIVE
Nitrite: NEGATIVE
Protein, ur: 30 mg/dL — AB
Specific Gravity, Urine: 1.033 — ABNORMAL HIGH (ref 1.005–1.030)
Urobilinogen, UA: 1 mg/dL (ref 0.0–1.0)
pH: 5 (ref 5.0–8.0)

## 2014-01-20 LAB — I-STAT CHEM 8, ED
BUN: 12 mg/dL (ref 6–23)
CREATININE: 0.9 mg/dL (ref 0.50–1.35)
Calcium, Ion: 1.18 mmol/L (ref 1.12–1.23)
Chloride: 103 mEq/L (ref 96–112)
Glucose, Bld: 230 mg/dL — ABNORMAL HIGH (ref 70–99)
HCT: 41 % (ref 39.0–52.0)
Hemoglobin: 13.9 g/dL (ref 13.0–17.0)
POTASSIUM: 3.8 meq/L (ref 3.7–5.3)
Sodium: 140 mEq/L (ref 137–147)
TCO2: 23 mmol/L (ref 0–100)

## 2014-01-20 LAB — LIPASE, BLOOD: Lipase: 40 U/L (ref 11–59)

## 2014-01-20 MED ORDER — GI COCKTAIL ~~LOC~~
30.0000 mL | Freq: Once | ORAL | Status: AC
  Administered 2014-01-20: 30 mL via ORAL
  Filled 2014-01-20: qty 30

## 2014-01-20 MED ORDER — GI COCKTAIL ~~LOC~~: 30.0000 mL | Freq: Once | ORAL | Status: DC

## 2014-01-20 MED ORDER — MAGNESIUM CITRATE PO SOLN: 1.0000 | Freq: Once | ORAL | Status: DC

## 2014-01-20 MED ORDER — METOCLOPRAMIDE HCL 10 MG PO TABS: 10.0000 mg | ORAL_TABLET | Freq: Four times a day (QID) | ORAL | Status: DC

## 2014-01-20 MED ORDER — SODIUM CHLORIDE 0.9 % IV BOLUS (SEPSIS)
500.0000 mL | Freq: Once | INTRAVENOUS | Status: AC
  Administered 2014-01-20: 500 mL via INTRAVENOUS

## 2014-01-20 MED ORDER — HYDROMORPHONE HCL PF 1 MG/ML IJ SOLN
1.0000 mg | Freq: Once | INTRAMUSCULAR | Status: AC
  Administered 2014-01-20: 1 mg via INTRAVENOUS
  Filled 2014-01-20: qty 1

## 2014-01-20 MED ORDER — GI COCKTAIL ~~LOC~~
10.0000 mL | Freq: Once | ORAL | Status: DC
  Filled 2014-01-20: qty 30

## 2014-01-20 MED ORDER — ONDANSETRON HCL 4 MG/2ML IJ SOLN
4.0000 mg | Freq: Once | INTRAMUSCULAR | Status: AC
  Administered 2014-01-20: 4 mg via INTRAVENOUS
  Filled 2014-01-20: qty 2

## 2014-01-20 NOTE — ED Notes (Signed)
Pt ambulate without distress. Alert x4 respirations easy non labored.

## 2014-01-20 NOTE — ED Notes (Signed)
Pt arrives via POV from home with RUQ abdominal pain for the last several months. Pt denies recent fever. C/o nausea but no vomiting.Denies recent diarrhea, changes in bowels.

## 2014-01-20 NOTE — ED Provider Notes (Signed)
CSN: [id number]     Arrival date & time 01/20/14  1451 History   First MD Initiated Contact with Patient 01/20/14 1553     Chief Complaint  Patient presents with  . Abdominal Pain     (Consider location/radiation/quality/duration/timing/severity/associated sxs/prior Treatment) Patient is a 43 y.o. male presenting with abdominal pain.  Abdominal Pain Associated symptoms: constipation, nausea and vomiting   Associated symptoms: no chest pain, no chills, no diarrhea, no dysuria, no fever, no hematuria and no shortness of breath    Mr. Emrich is a 43 year old male with past medical history of diabetes, hypertension, pancreatitis who presents to the ER today with a six-month history of abdominal pain which he states became acutely worse last night. Patient states he noticed the pain after eating, and became nauseated, and vomited several times. Patient describes his pain as a constant, aching pain. Patient states his pain is located in the epigastrium and right upper quadrant. Patient has been to the ER multiple times in the past year for the same complaint. He has had multiple imaging done in the past 2 years including CT scans, abdominal ultrasounds which have been unremarkable other than an incidental finding of cyst on his liver and a CT done in 03/2013. Patient has been provided with followup GI, however states that he has been unable to attend these appointments due to insurance problems.  Past Medical History  Diagnosis Date  . Diabetes mellitus   . Hypertension   . Neuropathy of lower extremity   . Pancreatitis 06/2012   History reviewed. No pertinent past surgical history. Family History  Problem Relation Age of Onset  . Diabetes Mother   . Diabetes Maternal Aunt    History  Substance Use Topics  . Smoking status: Current Every Day Smoker -- 0.25 packs/day    Types: Cigarettes  . Smokeless tobacco: Not on file  . Alcohol Use: No    Review of Systems  Constitutional:  Negative for fever and chills.  HENT: Negative for trouble swallowing.   Eyes: Negative for visual disturbance.  Respiratory: Negative for chest tightness and shortness of breath.   Cardiovascular: Negative for chest pain and palpitations.  Gastrointestinal: Positive for nausea, vomiting, abdominal pain and constipation. Negative for diarrhea, blood in stool and anal bleeding.  Endocrine: Positive for polyuria.  Genitourinary: Negative for dysuria, urgency and hematuria.  Musculoskeletal: Negative for arthralgias.  Skin: Negative for rash.  Neurological: Negative for dizziness, syncope, weakness, numbness and headaches.  Psychiatric/Behavioral: Negative.       Allergies  Morphine and related  Home Medications   Prior to Admission medications   Medication Sig Start Date End Date Taking? Authorizing Provider  amLODipine (NORVASC) 10 MG tablet Take 10 mg by mouth daily.   Yes Historical Provider, MD  gabapentin (NEURONTIN) 300 MG capsule Take 300 mg by mouth 3 (three) times daily.   Yes Historical Provider, MD  glipiZIDE (GLUCOTROL) 10 MG tablet Take 10 mg by mouth daily.   Yes Historical Provider, MD  lisinopril-hydrochlorothiazide (PRINZIDE,ZESTORETIC) 20-12.5 MG per tablet Take 1 tablet by mouth daily.   Yes Historical Provider, MD  metFORMIN (GLUCOPHAGE) 1000 MG tablet Take 1,000 mg by mouth 2 (two) times daily with a meal.     Yes Historical Provider, MD  metoprolol (LOPRESSOR) 50 MG tablet Take 50 mg by mouth 2 (two) times daily.   Yes Historical Provider, MD  magnesium citrate SOLN Take 296 mLs (1 Bottle total) by mouth once. 01/20/14   Carrie Mew,  PA-C  metoCLOPramide (REGLAN) 10 MG tablet Take 1 tablet (10 mg total) by mouth every 6 (six) hours. 01/20/14   Carrie Mew, PA-C   BP 143/95  Pulse 94  Temp(Src) 98.2 F (36.8 C) (Oral)  Resp 23  SpO2 99% Physical Exam  Constitutional: He is oriented to person, place, and time. He appears well-developed and well-nourished. No  distress.  HENT:  Head: Normocephalic and atraumatic.  Eyes: Pupils are equal, round, and reactive to light.  Neck: Normal range of motion.  Cardiovascular: Normal rate, regular rhythm and normal heart sounds.   No murmur heard. Pulmonary/Chest: Effort normal and breath sounds normal. No respiratory distress. He exhibits no tenderness.  Abdominal: Soft. Normal appearance. Bowel sounds are decreased. There is tenderness in the right upper quadrant and epigastric area. There is no rigidity, no rebound, no guarding, no tenderness at McBurney's point and negative Murphy's sign.  Neurological: He is alert and oriented to person, place, and time. No cranial nerve deficit or sensory deficit.  Skin: He is not diaphoretic.  Psychiatric: He has a normal mood and affect.    ED Course  Procedures (including critical care time) Labs Review Labs Reviewed  CBC WITH DIFFERENTIAL - Abnormal; Notable for the following:    Hemoglobin 12.3 (*)    HCT 36.6 (*)    All other components within normal limits  BASIC METABOLIC PANEL - Abnormal; Notable for the following:    Glucose, Bld 221 (*)    All other components within normal limits  URINALYSIS, ROUTINE W REFLEX MICROSCOPIC - Abnormal; Notable for the following:    APPearance HAZY (*)    Specific Gravity, Urine 1.033 (*)    Glucose, UA >1000 (*)    Hgb urine dipstick MODERATE (*)    Protein, ur 30 (*)    All other components within normal limits  I-STAT CHEM 8, ED - Abnormal; Notable for the following:    Glucose, Bld 230 (*)    All other components within normal limits  LIPASE, BLOOD  URINE MICROSCOPIC-ADD ON    Imaging Review Dg Abd 2 Views  01/20/2014   CLINICAL DATA:  Right upper quadrant pain  EXAM: ABDOMEN - 2 VIEW  COMPARISON:  None.  FINDINGS: There are no abnormally dilated loops of bowel. There is stool throughout most of the colon. Particularly there is stool throughout the proximal half of the large bowel. There is no evidence of  abnormal opacities.  IMPRESSION: No acute findings.  Constipation   Electronically Signed   By: Skipper Cliche M.D.   On: 01/20/2014 17:18     EKG Interpretation None      MDM   Final diagnoses:  Right upper quadrant pain  Epigastric abdominal pain    43 year old male with past medical history of hypertension, diabetes, pancreatitis presenting with 6 months of epigastric/ upper right quadrant abdominal pain, worsening yesterday. Workup to include CBC, BMP, UA, chem 8, lipase, abdomen 2 view x-ray. Further imaging deferred due to the fact the patient has had multiple CT scans and an ultrasound of his upper right quadrant which have all been inconclusive of patient's chronic pain.   Labs remarkable for hyperglycemia, glucosuria. Lipase 40. Patient hydrated with fluids.   1820: Patient states GI cocktail did not relieve his pain. He states that zofran did relieve nausea. We'll treat pain with Dilaudid 1 mg.  1930: Patient feels his pain is under control at this time and is comfortable going home. With patient's pain being under  control, nausea subsided and review of patient's recent imaging showing inconclusive evidence to the etiology of patient's chronic abdominal pain, we will discharge patient this time and have him followup with GI. Patient states he is now working again, and will be able to followup. I stressed the importance of following up with GI and his PCP to help determine the etiology of his pain. I also expressed that this will also help him find a more effective manner to gain pain control. Patient is agreeable to this plan and is sent home with some Reglan to help control nausea, and mag citrate to help discomfort from constipation.   Signed,  Dahlia Bailiff, PA-C 8:32 PM  This patient seen and discussed with Dr. Darl Householder, M.D.  Carrie Mew, PA-C 01/20/14 2033

## 2014-01-20 NOTE — Discharge Instructions (Signed)
Abdominal Pain °Many things can cause abdominal pain. Usually, abdominal pain is not caused by a disease and will improve without treatment. It can often be observed and treated at home. Your health care provider will do a physical exam and possibly order blood tests and X-rays to help determine the seriousness of your pain. However, in many cases, more time must pass before a clear cause of the pain can be found. Before that point, your health care provider may not know if you need more testing or further treatment. °HOME CARE INSTRUCTIONS  °Monitor your abdominal pain for any changes. The following actions may help to alleviate any discomfort you are experiencing: °· Only take over-the-counter or prescription medicines as directed by your health care provider. °· Do not take laxatives unless directed to do so by your health care provider. °· Try a clear liquid diet (broth, tea, or water) as directed by your health care provider. Slowly move to a bland diet as tolerated. °SEEK MEDICAL CARE IF: °· You have unexplained abdominal pain. °· You have abdominal pain associated with nausea or diarrhea. °· You have pain when you urinate or have a bowel movement. °· You experience abdominal pain that wakes you in the night. °· You have abdominal pain that is worsened or improved by eating food. °· You have abdominal pain that is worsened with eating fatty foods. °· You have a fever. °SEEK IMMEDIATE MEDICAL CARE IF:  °· Your pain does not go away within 2 hours. °· You keep throwing up (vomiting). °· Your pain is felt only in portions of the abdomen, such as the right side or the left lower portion of the abdomen. °· You pass bloody or black tarry stools. °MAKE SURE YOU: °· Understand these instructions.   °· Will watch your condition.   °· Will get help right away if you are not doing well or get worse.   °Document Released: 03/13/2005 Document Revised: 06/08/2013 Document Reviewed: 02/10/2013 °ExitCare® Patient Information  ©2015 [employer]. This information is not intended to replace advice given to you by your health care provider. Make sure you discuss any questions you have with your health care provider. ° °Constipation °Constipation is when a person has fewer than three bowel movements a week, has difficulty having a bowel movement, or has stools that are dry, hard, or larger than normal. As people grow older, constipation is more common. If you try to fix constipation with medicines that make you have a bowel movement (laxatives), the problem may get worse. Long-term laxative use may cause the muscles of the colon to become weak. A low-fiber diet, not taking in enough fluids, and taking certain medicines may make constipation worse.  °CAUSES  °· Certain medicines, such as antidepressants, pain medicine, iron supplements, antacids, and water pills.   °· Certain diseases, such as diabetes, irritable bowel syndrome (IBS), thyroid disease, or depression.   °· Not drinking enough water.   °· Not eating enough fiber-rich foods.   °· Stress or travel.   °· Lack of physical activity or exercise.   °· Ignoring the urge to have a bowel movement.   °· Using laxatives too much.   °SIGNS AND SYMPTOMS  °· Having fewer than three bowel movements a week.   °· Straining to have a bowel movement.   °· Having stools that are hard, dry, or larger than normal.   °· Feeling full or bloated.   °· Pain in the lower abdomen.   °· Not feeling relief after having a bowel movement.   °DIAGNOSIS  °Your health care provider will take   a medical history and perform a physical exam. Further testing may be done for severe constipation. Some tests may include:  A barium enema X-ray to examine your rectum, colon, and, sometimes, your small intestine.   A sigmoidoscopy to examine your lower colon.   A colonoscopy to examine your entire colon. TREATMENT  Treatment will depend on the severity of your constipation and what is causing it. Some dietary  treatments include drinking more fluids and eating more fiber-rich foods. Lifestyle treatments may include regular exercise. If these diet and lifestyle recommendations do not help, your health care provider may recommend taking over-the-counter laxative medicines to help you have bowel movements. Prescription medicines may be prescribed if over-the-counter medicines do not work.  HOME CARE INSTRUCTIONS   Eat foods that have a lot of fiber, such as fruits, vegetables, whole grains, and beans.  Limit foods high in fat and processed sugars, such as french fries, hamburgers, cookies, candies, and soda.   A fiber supplement may be added to your diet if you cannot get enough fiber from foods.   Drink enough fluids to keep your urine clear or pale yellow.   Exercise regularly or as directed by your health care provider.   Go to the restroom when you have the urge to go. Do not hold it.   Only take over-the-counter or prescription medicines as directed by your health care provider. Do not take other medicines for constipation without talking to your health care provider first.  [place] IF:   You have bright red blood in your stool.   Your constipation lasts for more than 4 days or gets worse.   You have abdominal or rectal pain.   You have thin, pencil-like stools.   You have unexplained weight loss. MAKE SURE YOU:   Understand these instructions.  Will watch your condition.  Will get help right away if you are not doing well or get worse. Document Released: 03/01/2004 Document Revised: 06/08/2013 Document Reviewed: 03/15/2013 [facility] Patient Information 2015 [place], [place]. This information is not intended to replace advice given to you by your health care provider. Make sure you discuss any questions you have with your health care provider.   Emergency Department Resource Guide 1) Find a Doctor and Pay Out of Pocket Although you won't have to find  out who is covered by your insurance plan, it is a good idea to ask around and get recommendations. You will then need to call the office and see if the doctor you have chosen will accept you as a new patient and what types of options they offer for patients who are self-pay. Some doctors offer discounts or will set up payment plans for their patients who do not have insurance, but you will need to ask so you aren't surprised when you get to your appointment.  2) Contact Your Local Health Department Not all health departments have doctors that can see patients for sick visits, but many do, so it is worth a call to see if yours does. If you don't know where your local health department is, you can check in your phone book. The CDC also has a tool to help you locate your state's health department, and many state websites also have listings of all of their local health departments.  3) Find a [facility] If your illness is not likely to be very severe or complicated, you may want to try a walk in clinic. These are popping up all over  the country in pharmacies, drugstores, and shopping centers. They're usually staffed by nurse practitioners or physician assistants that have been trained to treat common illnesses and complaints. They're usually fairly quick and inexpensive. However, if you have serious medical issues or chronic medical problems, these are probably not your best option.  No Primary Care Doctor: - Call Health Connect at  [phone] - they can help you locate a primary care doctor that  accepts your insurance, provides certain services, etc. - Physician Referral Service- [phone]  Chronic Pain Problems: Organization         Address  Phone   Notes  [facility]  [phone] Patients need to be referred by their primary care doctor.   Medication Assistance: Organization         Address  Phone   Notes  [facility] [address] [place], [place] [address] [phone] --Must be a resident of [facility] -- Must have NO insurance coverage whatsoever (no Medicaid/ Medicare, etc.) -- The pt. MUST have a primary care doctor that directs their care regularly and follows them in the community   [facility]  [phone]   [employer]  [phone]    Agencies that provide inexpensive medical care: Organization         Address  Phone   Notes  [place]  [phone]   [facility]    [phone]   [facility] [place], [place] [address] [phone]   [place] [address] [place] [phone]   Planned Parenthood    [phone]   [facility]    [phone]   [facility] [address], [place] Phone:  [phone], Fax:  [phone] Hours of Operation:  9 am - 6 pm, M-F.  Also accepts Medicaid/Medicare and self-pay.  [facility] for [place], [address], [place] Phone: [phone], Fax: [phone]. Hours of Operation:  8:30 am - 5:30 pm, M-F.  Also accepts Medicaid and self-pay.  [facility] [place] [address], [place] Phone: [phone]   [place], [place], [place] [phone], Ext. [phone] Mondays & Thursdays: 7-9 AM.  First 15 patients are seen on a first come, first serve basis.    [place] Providers:  Organization         Address  Phone   Notes  [facility] [address],  [phone] Also accepts self-pay patients.  [facility] [id number] [place], [place]  [phone]   [place], [address], [place] [phone]   [facility] [address], [place] [phone]   Lucianne Lei  [address], [place]   [phone] Only accepts [place] Access [place] patients after they have their name applied to their card.   Self-Pay (no insurance) in [facility]:  Organization         Address  Phone   Notes  Sickle Cell Patients, [facility] [place] [phone]   [facility] Urgent Care [place] [phone]   [facility]gent [place]  [place]  [address], [address], [place] [phone]   [facility]/Dr. Osei-Bonsu  [address], [place] or [address], [place] [phone] Phone number for both [place] and [place] locations is the same.  Urgent Medical and [facility] [address], [place] [phone]   [facility] [address], [place] or [address] [phone]   [facility] [address], [place] [phone], phone; [phone], fax Sees patients 1st and 3rd Saturday of every month.  Must not qualify for public or private insurance (i.e. Medicaid, Medicare, [place] Health Choice, Veterans' Benefits)  Household income should be no more than 200% of the poverty level The clinic cannot treat you if you are pregnant or think you are pregnant  Sexually transmitted diseases are not treated at the clinic.    Dental Care: Organization         Address  Phone  Notes  [facility] [place] [phone] Accepts children up to age 52 who are enrolled in [place] or [place]; pregnant women with a Medicaid card; and children who have applied for Medicaid or [place] Health Choice, but were declined, whose parents can pay a reduced fee at time of service.  [facility]  [address], [place] [phone] Accepts children up to age 77  who are enrolled in [place] or [place]; pregnant women with a Medicaid card; and children who have applied for Medicaid or [place] Health Choice, but were declined, whose parents can pay a reduced fee at time of service.  [place] Adult Dental Access PROGRAM  [place] [phone] Patients are seen by appointment only. Walk-ins are not accepted. [place] will see patients 29 years of age and older. Monday - Tuesday (8am-5pm) Most Wednesdays (8:30-5pm) $30 per visit, cash only  [facility] [facility] Adult Dental Access PROGRAM  [address], [facility] [phone] Patients are seen by appointment only. Walk-ins are not accepted. [place] will see patients 43 years of age and older. One Wednesday Evening (Monthly: Volunteer Based).  $30 per visit, cash only  [place]  [phone] for adults; Children under age 16, call Graduate Pediatric Dentistry at [phone]. Children aged 26-14, please call [phone] to request a pediatric application.  Dental services are provided in all areas of dental care including fillings, crowns and bridges, complete and partial dentures, implants, gum treatment, root canals, and extractions. Preventive care is also provided. Treatment is provided to both adults and children. Patients are selected via a lottery and there is often a waiting list.   [facility] [address], [place]  [phone] [email or web address]   [facility] [address] [place], [place] [phone], Ext. [phone] Second and Fourth Thursday of each month, opens at 6:30 AM; Clinic ends at 9 AM.  Patients are seen on a first-come first-served basis, and a limited number are seen during each clinic.   [facility]  [address] Hillard Danker [place], [place] [phone]   Eligibility Requirements You must have lived in [place], [place], or [place] counties for at least the last three months.    You cannot be eligible for state or federal sponsored [employer], including [employer], [place], or [employer].  You generally cannot be eligible for healthcare insurance through your employer.    How to apply: Eligibility screenings are held every Tuesday and Wednesday afternoon from 1:00 pm until 4:00 pm. You do not need an appointment for the interview!  [facility] [address], [place], [place]   [place]  [place] Department  [place]  [phone]    Behavioral Health Resources in the Community: Intensive Outpatient Programs Organization         Address  Phone  Notes  [place]. [address] [place], [place] [phone]   [facility] Outpatient [address], [place], [place]   ADS: Alcohol & Drug Svcs [address] [place], [place]   [place],  [place], [place] or [phone]   Substance Abuse Resources Organization         Address  Phone  Notes  Alcohol and Drug Services  [phone]   [place]  [phone]   The [place]   Chinita Pester  [phone]   Residential & Outpatient Substance Abuse Program  [phone]   Psychological Services Organization         Address  Phone  Notes  [facility] [place]  [place]  [phone]   [place] [address] [place] or [phone]    Mobile Crisis Teams Organization         Address  Phone  Notes  [facility], Mobile Crisis Care Unit  [phone]   Assertive Psychotherapeutic Services  [address] [place], [place]   [place] [address] [place] [phone]    Self-Help/Support  Groups Organization         Address  Phone             Notes  [place] of [place] - variety of support groups  [place] Call for more information  Narcotics Anonymous (NA), Caring Services [address], [employer] [place]  2 meetings at this location   Special educational needs teacher         Address  Phone  Notes  ASAP Residential Treatment [place],    [place]  [phone]   [facility]  [address] [place] [phone], [place], [place]   [place], [place] [phone] Admissions: 8am-3pm M-F  Incentives Substance [place] [address]    [place], [place] [phone]   [facility] [address] [place], [place], [place]   [facility] [address],  [place], [place]   [facility] Programs - Intensive Outpatient [address], [place], [place]   [facility] ([place].,  [place], [place] [phone] or [phone]   [facility]idential Treatment Services (RTS) [address] [place], [place] Accepts Medicaid  [facility] [place] [address],  [place] [phone] Substance Abuse/Addiction Treatment   [facility] Organization         Address  Phone  Notes  [facility]  [phone]   Domenic Schwab, PhD [address] [place], [place]   [phone] or [phone]   [facility]   [phone]  [address] [place], [place] [phone]   [facility] [address], [place], [place] [phone] Insurance/Medicaid/sponsorship through [facility] and Families [address] [place], [place] [phone] [place], [place] [phone]    Dr. Adele Schilder  [phone]   [facility] Dept. 1) [address], [place] 2) [place] 3)  [place] [address], [place] [phone]  [phone]   [place] [phone] or [phone] (After Hours)

## 2014-01-21 NOTE — ED Provider Notes (Signed)
Medical screening examination/treatment/procedure(s) were performed by non-physician practitioner and as supervising physician I was immediately available for consultation/collaboration.   EKG Interpretation None        Wandra Arthurs, MD 01/21/14 [phone]

## 2014-02-14 ENCOUNTER — Encounter (HOSPITAL_COMMUNITY): Payer: Self-pay | Admitting: Emergency Medicine

## 2014-02-14 ENCOUNTER — Observation Stay (HOSPITAL_COMMUNITY)
Admission: EM | Admit: 2014-02-14 | Discharge: 2014-02-15 | Disposition: A | Discharge status: Home / Self Care | Payer: Self-pay | Attending: Family Medicine | Admitting: Family Medicine

## 2014-02-14 ENCOUNTER — Observation Stay (HOSPITAL_COMMUNITY): Payer: Self-pay

## 2014-02-14 ENCOUNTER — Emergency Department (HOSPITAL_COMMUNITY): Payer: Self-pay

## 2014-02-14 DIAGNOSIS — E119 Type 2 diabetes mellitus without complications: Secondary | ICD-10-CM | POA: Insufficient documentation

## 2014-02-14 DIAGNOSIS — R1013 Epigastric pain: Secondary | ICD-10-CM

## 2014-02-14 DIAGNOSIS — R0602 Shortness of breath: Secondary | ICD-10-CM | POA: Insufficient documentation

## 2014-02-14 DIAGNOSIS — G5790 Unspecified mononeuropathy of unspecified lower limb: Secondary | ICD-10-CM | POA: Insufficient documentation

## 2014-02-14 DIAGNOSIS — F17200 Nicotine dependence, unspecified, uncomplicated: Secondary | ICD-10-CM | POA: Insufficient documentation

## 2014-02-14 DIAGNOSIS — R209 Unspecified disturbances of skin sensation: Secondary | ICD-10-CM | POA: Insufficient documentation

## 2014-02-14 DIAGNOSIS — Z79899 Other long term (current) drug therapy: Secondary | ICD-10-CM | POA: Insufficient documentation

## 2014-02-14 DIAGNOSIS — R079 Chest pain, unspecified: Principal | ICD-10-CM | POA: Insufficient documentation

## 2014-02-14 DIAGNOSIS — I10 Essential (primary) hypertension: Secondary | ICD-10-CM | POA: Insufficient documentation

## 2014-02-14 DIAGNOSIS — K859 Acute pancreatitis without necrosis or infection, unspecified: Secondary | ICD-10-CM | POA: Insufficient documentation

## 2014-02-14 HISTORY — DX: Chest pain, unspecified: R07.9

## 2014-02-14 LAB — RAPID URINE DRUG SCREEN, HOSP PERFORMED
AMPHETAMINES: NOT DETECTED
BARBITURATES: NOT DETECTED
BENZODIAZEPINES: NOT DETECTED
COCAINE: NOT DETECTED
Opiates: NOT DETECTED
TETRAHYDROCANNABINOL: NOT DETECTED

## 2014-02-14 LAB — CBC
HCT: 41 % (ref 39.0–52.0)
HEMATOCRIT: 38.3 % — AB (ref 39.0–52.0)
Hemoglobin: 13.7 g/dL (ref 13.0–17.0)
Hemoglobin: 13.2 g/dL (ref 13.0–17.0)
MCH: 28.7 pg (ref 26.0–34.0)
MCH: 29.5 pg (ref 26.0–34.0)
MCHC: 33.4 g/dL (ref 30.0–36.0)
MCHC: 34.5 g/dL (ref 30.0–36.0)
MCV: 85.8 fL (ref 78.0–100.0)
MCV: 85.5 fL (ref 78.0–100.0)
Platelets: 219 10*3/uL (ref 150–400)
Platelets: 207 10*3/uL (ref 150–400)
RBC: 4.78 MIL/uL (ref 4.22–5.81)
RBC: 4.48 MIL/uL (ref 4.22–5.81)
RDW: 13.3 % (ref 11.5–15.5)
RDW: 13.5 % (ref 11.5–15.5)
WBC: 9.8 10*3/uL (ref 4.0–10.5)
WBC: 10 10*3/uL (ref 4.0–10.5)

## 2014-02-14 LAB — BASIC METABOLIC PANEL
Anion gap: 17 — ABNORMAL HIGH (ref 5–15)
BUN: 14 mg/dL (ref 6–23)
CO2: 24 mEq/L (ref 19–32)
CREATININE: 0.87 mg/dL (ref 0.50–1.35)
Calcium: 9.1 mg/dL (ref 8.4–10.5)
Chloride: 95 mEq/L — ABNORMAL LOW (ref 96–112)
GFR calc Af Amer: >90 mL/min (ref >90)
GFR calc non Af Amer: >90 mL/min (ref >90)
GLUCOSE: 237 mg/dL — AB (ref 70–99)
Potassium: 3.8 mEq/L (ref 3.7–5.3)
Sodium: 136 mEq/L — ABNORMAL LOW (ref 137–147)

## 2014-02-14 LAB — LIPID PANEL
CHOL/HDL RATIO: 4.2 ratio
CHOLESTEROL: 158 mg/dL (ref 0–200)
HDL: 38 mg/dL — AB (ref >39)
LDL Cholesterol: UNDETERMINED mg/dL (ref 0–99)
Triglycerides: 571 mg/dL — ABNORMAL HIGH (ref <150)
VLDL: UNDETERMINED mg/dL (ref 0–40)

## 2014-02-14 LAB — CREATININE, SERUM
CREATININE: 0.85 mg/dL (ref 0.50–1.35)
GFR calc Af Amer: >90 mL/min (ref >90)
GFR calc non Af Amer: >90 mL/min (ref >90)

## 2014-02-14 LAB — I-STAT TROPONIN, ED: Troponin i, poc: 0 ng/mL (ref 0.00–0.08)

## 2014-02-14 LAB — GLUCOSE, CAPILLARY
Glucose-Capillary: 177 mg/dL — ABNORMAL HIGH (ref 70–99)
Glucose-Capillary: 254 mg/dL — ABNORMAL HIGH (ref 70–99)

## 2014-02-14 LAB — TROPONIN I
Troponin I: <0.3 ng/mL (ref <0.30)
Troponin I: <0.3 ng/mL

## 2014-02-14 LAB — TSH: TSH: 1.29 u[IU]/mL (ref 0.350–4.500)

## 2014-02-14 LAB — PRO B NATRIURETIC PEPTIDE: Pro B Natriuretic peptide (BNP): 9.2 pg/mL (ref 0–125)

## 2014-02-14 LAB — LIPASE, BLOOD: Lipase: 41 U/L (ref 11–59)

## 2014-02-14 MED ORDER — ASPIRIN 81 MG PO CHEW
324.0000 mg | CHEWABLE_TABLET | Freq: Once | ORAL | Status: AC
Start: 2014-02-14 — End: 2014-02-14
  Administered 2014-02-14: 324 mg via ORAL
  Filled 2014-02-14: qty 4

## 2014-02-14 MED ORDER — AMLODIPINE BESYLATE 10 MG PO TABS
10.0000 mg | ORAL_TABLET | Freq: Every day | ORAL | Status: DC
  Administered 2014-02-15: 10 mg via ORAL
  Filled 2014-02-14: qty 1

## 2014-02-14 MED ORDER — METOPROLOL TARTRATE 100 MG PO TABS
100.0000 mg | ORAL_TABLET | Freq: Two times a day (BID) | ORAL | Status: DC
  Administered 2014-02-14 – 2014-02-15 (×2): 100 mg via ORAL
  Filled 2014-02-14 (×3): qty 1

## 2014-02-14 MED ORDER — ENOXAPARIN SODIUM 40 MG/0.4ML ~~LOC~~ SOLN
40.0000 mg | SUBCUTANEOUS | Status: DC
  Filled 2014-02-14 (×2): qty 0.4

## 2014-02-14 MED ORDER — GABAPENTIN 300 MG PO CAPS
300.0000 mg | ORAL_CAPSULE | Freq: Three times a day (TID) | ORAL | Status: DC
  Administered 2014-02-14 – 2014-02-15 (×3): 300 mg via ORAL
  Filled 2014-02-14 (×5): qty 1

## 2014-02-14 MED ORDER — ONDANSETRON HCL 4 MG/2ML IJ SOLN
4.0000 mg | Freq: Four times a day (QID) | INTRAMUSCULAR | Status: DC | PRN
Start: 2014-02-14 — End: 2014-02-15

## 2014-02-14 MED ORDER — ACETAMINOPHEN 325 MG PO TABS
650.0000 mg | ORAL_TABLET | ORAL | Status: DC | PRN
  Administered 2014-02-14: 650 mg via ORAL
  Filled 2014-02-14: qty 2

## 2014-02-14 MED ORDER — KETOROLAC TROMETHAMINE 30 MG/ML IJ SOLN
30.0000 mg | Freq: Once | INTRAMUSCULAR | Status: AC
  Administered 2014-02-14: 30 mg via INTRAVENOUS
  Filled 2014-02-14: qty 1

## 2014-02-14 MED ORDER — LISINOPRIL-HYDROCHLOROTHIAZIDE 20-12.5 MG PO TABS: 1.0000 | ORAL_TABLET | Freq: Every day | ORAL | Status: DC

## 2014-02-14 MED ORDER — ONDANSETRON HCL 4 MG/2ML IJ SOLN
4.0000 mg | Freq: Once | INTRAMUSCULAR | Status: AC
  Administered 2014-02-14: 4 mg via INTRAVENOUS
  Filled 2014-02-14: qty 2

## 2014-02-14 MED ORDER — LISINOPRIL 20 MG PO TABS
20.0000 mg | ORAL_TABLET | Freq: Every day | ORAL | Status: DC
  Administered 2014-02-15: 20 mg via ORAL
  Filled 2014-02-14: qty 1

## 2014-02-14 MED ORDER — ASPIRIN 81 MG PO CHEW
81.0000 mg | CHEWABLE_TABLET | Freq: Every day | ORAL | Status: DC
  Administered 2014-02-15: 81 mg via ORAL
  Filled 2014-02-14: qty 1

## 2014-02-14 MED ORDER — INSULIN ASPART 100 UNIT/ML ~~LOC~~ SOLN: 0.0000 [IU] | Freq: Three times a day (TID) | SUBCUTANEOUS | Status: DC

## 2014-02-14 MED ORDER — NITROGLYCERIN 0.4 MG SL SUBL
0.4000 mg | SUBLINGUAL_TABLET | SUBLINGUAL | Status: AC | PRN
  Administered 2014-02-14 (×3): 0.4 mg via SUBLINGUAL
  Filled 2014-02-14 (×2): qty 1

## 2014-02-14 MED ORDER — NICOTINE 7 MG/24HR TD PT24
7.0000 mg | MEDICATED_PATCH | Freq: Every day | TRANSDERMAL | Status: DC
  Administered 2014-02-14 – 2014-02-15 (×2): 7 mg via TRANSDERMAL
  Filled 2014-02-14 (×2): qty 1

## 2014-02-14 MED ORDER — HYDROCHLOROTHIAZIDE 12.5 MG PO CAPS
12.5000 mg | ORAL_CAPSULE | Freq: Every day | ORAL | Status: DC
  Administered 2014-02-15: 12.5 mg via ORAL
  Filled 2014-02-14: qty 1

## 2014-02-14 MED ORDER — CYCLOBENZAPRINE HCL 5 MG PO TABS
5.0000 mg | ORAL_TABLET | Freq: Three times a day (TID) | ORAL | Status: DC | PRN
  Administered 2014-02-15: 5 mg via ORAL
  Filled 2014-02-14 (×2): qty 1

## 2014-02-14 MED ORDER — TRAMADOL HCL 50 MG PO TABS
50.0000 mg | ORAL_TABLET | Freq: Four times a day (QID) | ORAL | Status: DC
  Administered 2014-02-14 – 2014-02-15 (×4): 50 mg via ORAL
  Filled 2014-02-14 (×4): qty 1

## 2014-02-14 MED ORDER — GI COCKTAIL ~~LOC~~: 30.0000 mL | Freq: Four times a day (QID) | ORAL | Status: DC | PRN

## 2014-02-14 NOTE — ED Notes (Signed)
Pt reports mid chest tightness x 3 days but more severe since last night, having sharp pains into left arm and reports mild sob and nausea. ekg done at triage. No acute distress noted.

## 2014-02-14 NOTE — H&P (Signed)
[facility] Admission History and Physical Service Pager: [phone]  Patient name: Christopher Davila record number: [id number] Date of birth: 01/02/1971 Age: 43 y.o. Gender: male  Primary Care Provider: No PCP Per Patient Consultants: cards to see in the am Code Status: full  Chief Complaint: chest discomfort  Assessment and Plan: Christopher Davila is a 43 y.o. male presenting with chest pain subacute. PMH is significant for HTN, DM, Pancreatitis   #Chest pain- ddx includes ACS (with HEART score of 4- given risk factors and mod suspicious story along with prior EKG changes, however minimally responsive to ) vs symptomatic hyperglycemia (does have known hx of mod well controlled) vs costochondritis (pt denies any heavy lifting but pain reproducible upon palpation of chest also accompanying neck pain) vs GER (epigastric discomfort but denies typical sx) vs pancreatitis (no typical sx and feels different than prior) vs paroxysmal arrythmia (was told in past had hx of tachy? Flutter?); highly suspect MSK origin -admit to obs under Dr. Ree Kida -monitor cards rhythm on EKG -cycle trops, am EKG -cards to eval in am for possible stress vs outpt f/up -lipase -UDS -will increase BB to 100mg  BID -ASA 81 -Lipids, TSH, HgA1C -GI cocktail  #Neck pain- assc with arm numbness; ddx at this time includes bony abnormality (given impingment sx) vs muscular strain (tight traps bilat) vs infectious (doubt given no meningeal signs); mentating well, no visual impairments -monitor for development of fevers -could potential trial flexeril -xray c-spine -tramadol for pain control   #Tachycardia- potentially related to the above, vs prior hx of sinus tachy vs arrhythmia; currently on metoprolol 50mg  BID -monitor closely on tele -obtain UDS -EKG in the morning  #DM- last A1C 7.4 in Jan 2014; suspect not followed very closely 2/2 insurance issues; repeat A1C here -SSI and cbgs  ac/qhs -hold metformin and glipizide -potential need for SW consult for outpt management  #HTN- on 4 agents; reports good compliance and taking all meds this morning -vitals per floor protocol -cont lisinopril/hctz, metoprolol, amlodipine -monitor K and Cr (ok so far)  FEN/GI: carb modified Prophylaxis: lovenox  Disposition: admit to obvs under Dr. Ree Kida  History of Present Illness: Christopher Davila is a 43 y.o. male presenting with a dull CP ache for the past 2-3 days intermittent at first than became more constant. Pain worse with exertion, radiating down left arm. Also noted headache and neck stiffness at that time. Pt additionally c/o numbness on that same side. Does attest to SOB assc with discomfort and nausea. No diaphoresis or vomiting. Still smoking 2 cigarettes a day. No fever confusion or vision changes. Still has good ROM in neck.   In the ED HEART score- 4. itrop neg. CXR- no acute process. Glucose in 200s. Given 324mg  ASA, 30mg  toradol, 4mg  zofran and X3 nitro (with minimal to no relief)  Review Of Systems: Per HPI with the following additions: none Otherwise 12 point review of systems was performed and was unremarkable.  Patient Active Problem List   Diagnosis Date Noted  . Abdominal pain 12/28/2012  . Diabetes 06/26/2012  . Hypertension 06/26/2012  . Pancreatitis 06/26/2012  . Weight loss 06/26/2012  . Oral candidiasis 06/26/2012   Past Medical History: Past Medical History  Diagnosis Date  . Diabetes mellitus   . Hypertension   . Neuropathy of lower extremity   . Pancreatitis 06/2012   Past Surgical History: History reviewed. No pertinent past surgical history. Social History: History  Substance Use Topics  . Smoking status:  Current Every Day Smoker -- 0.25 packs/day    Types: Cigarettes  . Smokeless tobacco: Not on file  . Alcohol Use: No   Additional social history: cutting back on smoking   Please also refer to relevant sections of EMR.  Family  History: Family History  Problem Relation Age of Onset  . Diabetes Mother   . Diabetes Maternal Aunt    Allergies and Medications: Allergies  Allergen Reactions  . Morphine And Related Itching and Other (See Comments)    Pt prefers not to be given this drug   No current facility-administered medications on file prior to encounter.   Current Outpatient Prescriptions on File Prior to Encounter  Medication Sig Dispense Refill  . amLODipine (NORVASC) 10 MG tablet Take 10 mg by mouth daily.      Marland Kitchen gabapentin (NEURONTIN) 300 MG capsule Take 300 mg by mouth 3 (three) times daily.      Marland Kitchen glipiZIDE (GLUCOTROL) 10 MG tablet Take 10 mg by mouth daily.      Marland Kitchen lisinopril-hydrochlorothiazide (PRINZIDE,ZESTORETIC) 20-12.5 MG per tablet Take 1 tablet by mouth daily.      . metFORMIN (GLUCOPHAGE) 1000 MG tablet Take 1,000 mg by mouth 2 (two) times daily with a meal.        . metoprolol (LOPRESSOR) 50 MG tablet Take 50 mg by mouth 2 (two) times daily.        Objective: BP 125/75  Pulse 99  Temp(Src) 98.1 F (36.7 C) (Oral)  Resp 14  Ht 5\' 3"  (1.6 m)  Wt 140 lb (63.504 kg)  BMI 24.81 kg/m2  SpO2 100% Exam: General: awake alert communicative HEENT: NCAT, PERRL, EOMI, no erythema in pharynx Neck: Full ROM but pain when looking to the right; tender over traps and left shoulder; tender over chest wall Cardiovascular: tachycardic, RR, no m/r/g; dp 2+ Respiratory: CTAB, normal WOB Abdomen: Soft, nondistended, tender in epigastrium  Extremities: post neers; neg empty can, 4+/5 hand grip strength on the left but otherwise full strength throughout and ROM Skin: tattoosl no evidence of breakdown Neuro: neg meningeal signs, sensation intact bilat  Labs and Imaging: CBC BMET   Recent Labs Lab 02/14/14 1255  WBC 9.8  HGB 13.7  HCT 41.0  PLT 219    Recent Labs Lab 02/14/14 1255  NA 136*  K 3.8  CL 95*  CO2 24  BUN 14  CREATININE 0.87  GLUCOSE 237*  CALCIUM 9.1     itrop neg EKG  with ST depression in lead III- but improved from prior CXR nml  Bernadene Bell, MD 02/14/2014, 3:17 PM PGY-2, [place] Intern pager: [phone], text pages welcome

## 2014-02-14 NOTE — ED Notes (Signed)
Chest pain for 3 days; nothing reported to make worse or better; denies heavy lifting. PT reports worsening yesterday with left arm numbness and nausea. PT reports got worse when at work so came in. Reports neck stiffness as well. Has full ROM of neck.

## 2014-02-14 NOTE — ED Provider Notes (Signed)
CSN: [id number]     Arrival date & time 02/14/14  1246 History   First MD Initiated Contact with Patient 02/14/14 1412     Chief Complaint  Patient presents with  . Chest Pain  . Shortness of Breath     (Consider location/radiation/quality/duration/timing/severity/associated sxs/prior Treatment) HPI Comments: Patient with a history of DM, HTN, and smoking presents today with a chief complaint of chest pain.  He reports that a dull ache has been constant for the past 2-3 days, but that his pain worsens with exertion.  Pain is not worse with movement.  He reports that the pain radiates down his left arm.  Pain associated with some numbness of the left arm.  Pain also associated with SOB.  He denies abdominal pain, nausea, vomiting, diaphoresis, dizziness, or lightheadedness.  He denies prior cardiac history.  He states that he has never had a stress test or Cardiac Cath.  He denies Cocaine use.  He denies history of PE or DVT.  Denies prolonged travel or surgeries in the past 4 weeks.  Denies LE edema or pain.    The history is provided by the patient.    Past Medical History  Diagnosis Date  . Diabetes mellitus   . Hypertension   . Neuropathy of lower extremity   . Pancreatitis 06/2012   History reviewed. No pertinent past surgical history. Family History  Problem Relation Age of Onset  . Diabetes Mother   . Diabetes Maternal Aunt    History  Substance Use Topics  . Smoking status: Current Every Day Smoker -- 0.25 packs/day    Types: Cigarettes  . Smokeless tobacco: Not on file  . Alcohol Use: No    Review of Systems  All other systems reviewed and are negative.     Allergies  Morphine and related  Home Medications   Prior to Admission medications   Medication Sig Start Date End Date Taking? Authorizing Provider  amLODipine (NORVASC) 10 MG tablet Take 10 mg by mouth daily.    Historical Provider, MD  gabapentin (NEURONTIN) 300 MG capsule Take 300 mg by mouth 3  (three) times daily.    Historical Provider, MD  glipiZIDE (GLUCOTROL) 10 MG tablet Take 10 mg by mouth daily.    Historical Provider, MD  lisinopril-hydrochlorothiazide (PRINZIDE,ZESTORETIC) 20-12.5 MG per tablet Take 1 tablet by mouth daily.    Historical Provider, MD  magnesium citrate SOLN Take 296 mLs (1 Bottle total) by mouth once. 01/20/14   Carrie Mew, PA-C  metFORMIN (GLUCOPHAGE) 1000 MG tablet Take 1,000 mg by mouth 2 (two) times daily with a meal.      Historical Provider, MD  metoCLOPramide (REGLAN) 10 MG tablet Take 1 tablet (10 mg total) by mouth every 6 (six) hours. 01/20/14   Carrie Mew, PA-C  metoprolol (LOPRESSOR) 50 MG tablet Take 50 mg by mouth 2 (two) times daily.    Historical Provider, MD   BP 146/95  Pulse 103  Temp(Src) 98.1 F (36.7 C) (Oral)  Resp 24  Ht 5\' 3"  (1.6 m)  Wt 140 lb (63.504 kg)  BMI 24.81 kg/m2  SpO2 100% Physical Exam  Nursing note and vitals reviewed. Constitutional: He appears well-developed and well-nourished. No distress.  HENT:  Head: Normocephalic and atraumatic.  Mouth/Throat: Oropharynx is clear and moist.  Neck: Normal range of motion. Neck supple.  Cardiovascular: Normal rate, regular rhythm and normal heart sounds.   Pulmonary/Chest: Effort normal and breath sounds normal. He exhibits tenderness.  Abdominal:  Soft. There is no tenderness.  Musculoskeletal: Normal range of motion.  No LE edema and erythema bilaterally  Neurological: He is alert.  Skin: Skin is warm and dry. He is not diaphoretic. No erythema.  Psychiatric: He has a normal mood and affect.    ED Course  Procedures (including critical care time) Labs Review Labs Reviewed  BASIC METABOLIC PANEL - Abnormal; Notable for the following:    Sodium 136 (*)    Chloride 95 (*)    Glucose, Bld 237 (*)    Anion gap 17 (*)    All other components within normal limits  PRO B NATRIURETIC PEPTIDE  CBC  I-STAT TROPOININ, ED    Imaging Review Dg Chest 2  View  02/14/2014   CLINICAL DATA:  Left-sided chest pain.  EXAM: CHEST  2 VIEW  COMPARISON:  10/07/2013.  FINDINGS: The heart size and mediastinal contours are within normal limits. Both lungs are clear. The visualized skeletal structures are unremarkable.  IMPRESSION: No active cardiopulmonary disease.   Electronically Signed   By: Misty Stanley M.D.   On: 02/14/2014 13:50     EKG Interpretation   Date/Time:  Monday February 14 2014 12:51:58 EDT Ventricular Rate:  107 PR Interval:  156 QRS Duration: 80 QT Interval:  338 QTC Calculation: 451 R Axis:   83 Text Interpretation:  Sinus tachycardia Non specific T wave inversions No  new changes Otherwise normal ECG Confirmed by Kathrynn Humble, MD, Thelma Comp [phone])  on 02/14/2014 2:35:39 PM      MDM   Final diagnoses:  None   Patient with a history of HTN, DM, and smoking presenting with a chief complaint of chest pain.  He reports that he has has been constant for 2-3 days, but does become worse with exertion.  CXR negative.  Nonspecific changes on EKG.  Initial troponin negative.  HEART score is 4.  Patient discussed with Internal Medicine Teaching Service who has agreed to admit the patient.    Hyman Bible, PA-C 02/15/14 [phone]

## 2014-02-14 NOTE — ED Notes (Signed)
Hooked Pt up to BP, Pulse Ox, Pt already placed on 5Lead.

## 2014-02-15 DIAGNOSIS — R079 Chest pain, unspecified: Secondary | ICD-10-CM

## 2014-02-15 DIAGNOSIS — E119 Type 2 diabetes mellitus without complications: Secondary | ICD-10-CM

## 2014-02-15 LAB — HEMOGLOBIN A1C
Hgb A1c MFr Bld: 8.9 % — ABNORMAL HIGH (ref <5.7)
MEAN PLASMA GLUCOSE: 209 mg/dL — AB (ref <117)

## 2014-02-15 LAB — GLUCOSE, CAPILLARY
Glucose-Capillary: 197 mg/dL — ABNORMAL HIGH (ref 70–99)
Glucose-Capillary: 237 mg/dL — ABNORMAL HIGH (ref 70–99)

## 2014-02-15 LAB — TROPONIN I: Troponin I: <0.3 ng/mL (ref <0.30)

## 2014-02-15 MED ORDER — ASPIRIN 81 MG PO CHEW: 81.0000 mg | CHEWABLE_TABLET | Freq: Every day | ORAL | Status: DC

## 2014-02-15 MED ORDER — CYCLOBENZAPRINE HCL 5 MG PO TABS: 5.0000 mg | ORAL_TABLET | Freq: Three times a day (TID) | ORAL | Status: DC | PRN

## 2014-02-15 MED ORDER — ATORVASTATIN CALCIUM 40 MG PO TABS: 40.0000 mg | ORAL_TABLET | Freq: Every day | ORAL | Status: DC

## 2014-02-15 MED ORDER — NICOTINE 7 MG/24HR TD PT24: 7.0000 mg | MEDICATED_PATCH | Freq: Every day | TRANSDERMAL | Status: DC

## 2014-02-15 NOTE — Discharge Summary (Signed)
FMTS Attending Note  I personally saw and evaluated the patient. The plan of care was discussed with the resident team. I agree with the assessment and plan as documented by the resident.   Cardiac workup negative. CP due to MSK etiology (normal rotator cuff exam, expect muscle spasm). ASCVD risk score elevated therefore start Statin and ASA. Attempt trial of flexeril and heat to the affected area. Will need outpatient follow up.   Dossie Arbour MD

## 2014-02-15 NOTE — Progress Notes (Signed)
Seen and examined. Still with mild chest discomfort which is positional, worse with palpation. RRR, lungs clear, mild midepigastric tenderness, no edema. Troponin negative x 3. EKG shows NSR without ischemic changes this morning. I suspect this is musculoskeletal chest pain. I would not recommend stress testing at this time. Probably can treat with NSAIDS. Triglycerides are very high - this can put him at risk of recurrent pancreatitis. Would treat this aggressively.   Thanks for consulting [place]. Call with questions.  Pixie Casino, MD, [facility] Attending Cardiologist [place]

## 2014-02-15 NOTE — Progress Notes (Signed)
Family Medicine Teaching Service Daily Progress Note Intern Pager: [phone]  Patient name: Kurten record number: [id number] Date of birth: 14-Nov-1970 Age: 43 y.o. Gender: male  Primary Care Provider: No PCP Per Patient Consultants: Cardiology Code Status: Full  Pt Overview and Major Events to Date:  02/14/14- Admission 02/15/14  Assessment and Plan: Mr. Wigger is a 43year old male with history of HTN, DM, and Pancreatitis presenting with subacute chest pain. #Chest Pain -DDx:  ACS vs. Hyperglycemia vs. Costochondritis vs. GERD vs. Pancreatitis vs. Paroxysmal Arrhythmia  -ACS- HEART Score=4  -Hyperglycemia- moderately well controlled  -Costochondritis- denies heavy lifting, but reproducible upon palpation and accompanied by neck pain   -Considered most likely based on exam and presentation  -GERD- epigastric dysfunction  -Pancreatitis- feels different that previous presentation  -Paroxysmal Arrythmia- reports h/o tachycardia vs. Flutter -Monitor cardiac rhythm  -Cardiology Consulted:  -Cleared from Cardiology Standpoint  -Chest pain is musculoskeletal in nature  -No stress test indicated at this time -Labs:  -Troponin- <0.3 x3  -Lipase- 41  -Lipids- Total Cholesterol 159, Triglycerides 571, HDL 38   -ASCVD 19.5%   -Discharge on Lipitor 40mg   -TSH- 1.29  -HgA1C- 8.9 -Medications:  -Metoprolol 100mg  BID  -Aspirin 81mg   -Tramadol 50mg  four times a day -PRN Medications:  - Acetaminophen 650mg  q4hr   -GI Cocktail (Maalox, Lidocaine, Donnatal)- 2mL four times daily   -Flexeril 5mg  TID   -Nitroglycerin 0.4mg   #Neck Pain -DDx:  Impingement vs. Muscular Strain  -Imaging:  -Xray of C-spine- Mild straightening of the mid to lower cervical lordosis   #Tachycardia  -Metoprolol 100mg  BID  -Monitor on telemetry  -UDS- clear  -EKG- sinus tachycardia  #DM -A1C=7.4 in January 2014  -A1C pending -Glucose- 197 -SSI  -Hold metformin and glipizide -Gabapentin  300mg  TID -Consider Social Work for outpatient management  #HTN -Continue Lisinopril 20mg , HCTZ 12.5mg , Metoprolol 100mg , and Amlodipine 10mg   FEN/GI: Carb Modified Diet; Saline Lock PPx: Lovenox 40mg   Disposition:  -Admitted to Observation under Dr. Ree Kida. -Anticipated discharge today.  Subjective:  No acute complaints overnight.  Pain is 8/10 in left chest extending up into neck and down left arm.  Unchanged since yesterday.  No problems with urination.  No changes in appetite.  No further complaints today.  Objective: Temp:  [97.7 F (36.5 C)-98.4 F (36.9 C)] 98 F (36.7 C) (09/01 0349) Pulse Rate:  [75-103] 75 (09/01 0349) Resp:  [13-24] 18 (09/01 0349) BP: (125-147)/(75-99) 144/93 mmHg (09/01 0349) SpO2:  [96 %-100 %] 99 % (09/01 0349) Weight:  [140 lb (63.504 kg)-148 lb 1.6 oz (67.178 kg)] 148 lb 1.6 oz (67.178 kg) (09/01 0349) Physical Exam: General: 43yo male resting comfortably and in no apparent distress Cardiovascular: S1 and S2 noted; no murmurs noted; regular rate and rhythm Respiratory: Clear to auscultation bilaterally; no wheezing; no increased work of breathing Abdomen: bowel sounds noted; no masses or tenderness to palpation; soft and nondistended Extremities: No edema noted Musc:  Inhalation somatic dysfunction of left ribs; thoracic spine dysfunction corresponding to ribs  Laboratory:  Recent Labs Lab 02/14/14 1255 02/14/14 1740  WBC 9.8 10.0  HGB 13.7 13.2  HCT 41.0 38.3*  PLT 219 207    Recent Labs Lab 02/14/14 1255 02/14/14 1740  NA 136*  --   K 3.8  --   CL 95*  --   CO2 24  --   BUN 14  --   CREATININE 0.87 0.85  CALCIUM 9.1  --   GLUCOSE 237*  --  Imaging/Diagnostic Tests: EKG- Sinus Tachycardia  Lorna Few, DO 02/15/2014, 7:18 AM PGY-1, [place] Intern pager: [phone], text pages welcome

## 2014-02-15 NOTE — Discharge Instructions (Signed)
You were in the hospital from 02/14/14 to 02/15/14 for further evaluation of the chest pain you were experiencing.  The labs we look at to determine if the cause of your chest pain is due to a heart attack were all normal.  The labs we look at to determine if your chest pain is due to problems with your pancreas are also normal.  An EKG was done and showed that your heart rhythm was normal, however your heart rate was a little bit elevated due to your pain.  The EKG showed no signs of heart attack. Our labs did show that your cholesterol was high, so we recommend starting Lipitor 40mg  once a day to help decrease it as this can increase your risk of heart attacks; a prescription for Lipitor has been sent to your pharmacy. We also recommend taking Baby Aspirin 81mg  once a day.  We believe your chest pain is coming from sore muscles in your neck and chest.  We are sending in a prescription for Flexeril 5mg , which you can take three times a day for muscle spasms as needed.  You were discharged on 02/15/14 after it was determined that your chest pain is not due to a heart attack and is most likely musculoskeletal in nature.  We are setting you up an appointment with a Primary Care Doctor to follow-up with concerning your chest pain, neck pain, high cholesterol, and diabetes management.  Thank you so much for allowing [place] to care for you while you were admitted to [facility]!  Chest Pain (Nonspecific) It is often hard to give a specific diagnosis for the cause of chest pain. There is always a chance that your pain could be related to something serious, such as a heart attack or a blood clot in the lungs. You need to follow up with your health care provider for further evaluation. CAUSES   Heartburn.  Pneumonia or bronchitis.  Anxiety or stress.  Inflammation around your heart (pericarditis) or lung (pleuritis or pleurisy).  A blood clot in the lung.  A collapsed lung (pneumothorax). It can develop suddenly on  its own (spontaneous pneumothorax) or from trauma to the chest.  Shingles infection (herpes zoster virus). The chest wall is composed of bones, muscles, and cartilage. Any of these can be the source of the pain.  The bones can be bruised by injury.  The muscles or cartilage can be strained by coughing or overwork.  The cartilage can be affected by inflammation and become sore (costochondritis). DIAGNOSIS  Lab tests or other studies may be needed to find the cause of your pain. Your health care provider may have you take a test called an ambulatory electrocardiogram (ECG). An ECG records your heartbeat patterns over a 24-hour period. You may also have other tests, such as:  Transthoracic echocardiogram (TTE). During echocardiography, sound waves are used to evaluate how blood flows through your heart.  Transesophageal echocardiogram (TEE).  Cardiac monitoring. This allows your health care provider to monitor your heart rate and rhythm in real time.  Holter monitor. This is a portable device that records your heartbeat and can help diagnose heart arrhythmias. It allows your health care provider to track your heart activity for several days, if needed.  Stress tests by exercise or by giving medicine that makes the heart beat faster. TREATMENT   Treatment depends on what may be causing your chest pain. Treatment may include:  Acid blockers for heartburn.  Anti-inflammatory medicine.  Pain medicine for inflammatory  conditions.  Antibiotics if an infection is present.  You may be advised to change lifestyle habits. This includes stopping smoking and avoiding alcohol, caffeine, and chocolate.  You may be advised to keep your head raised (elevated) when sleeping. This reduces the chance of acid going backward from your stomach into your esophagus. Most of the time, nonspecific chest pain will improve within 2-3 days with rest and mild pain medicine.  HOME CARE INSTRUCTIONS   If  antibiotics were prescribed, take them as directed. Finish them even if you start to feel better.  For the next few days, avoid physical activities that bring on chest pain. Continue physical activities as directed.  Do not use any tobacco products, including cigarettes, chewing tobacco, or electronic cigarettes.  Avoid drinking alcohol.  Only take medicine as directed by your health care provider.  Follow your health care provider's suggestions for further testing if your chest pain does not go away.  Keep any follow-up appointments you made. If you do not go to an appointment, you could develop lasting (chronic) problems with pain. If there is any problem keeping an appointment, call to reschedule. SEEK MEDICAL CARE IF:   Your chest pain does not go away, even after treatment.  You have a rash with blisters on your chest.  You have a fever. SEEK IMMEDIATE MEDICAL CARE IF:   You have increased chest pain or pain that spreads to your arm, neck, jaw, back, or abdomen.  You have shortness of breath.  You have an increasing cough, or you cough up blood.  You have severe back or abdominal pain.  You feel nauseous or vomit.  You have severe weakness.  You faint.  You have chills. This is an emergency. Do not wait to see if the pain will go away. Get medical help at once. Call your local emergency services (911 in [place] Do not drive yourself to the hospital. MAKE SURE YOU:   Understand these instructions.  Will watch your condition.  Will get help right away if you are not doing well or get worse. Document Released: 03/13/2005 Document Revised: 06/08/2013 Document Reviewed: 01/07/2008 [facility] Patient Information 2015 [place], [place]. This information is not intended to replace advice given to you by your health care provider. Make sure you discuss any questions you have with your health care provider.

## 2014-02-15 NOTE — Progress Notes (Signed)
FMTS Attending Note  I personally saw and evaluated the patient. The plan of care was discussed with the resident team. I agree with the assessment and plan as documented by the resident.   Anniston Nellums MD 

## 2014-02-15 NOTE — Progress Notes (Signed)
UR completed 

## 2014-02-15 NOTE — Discharge Summary (Signed)
[facility] Discharge Summary  Patient name: Christopher Davila record number: [id number] Date of birth: 1971/03/16 Age: 43 y.o. Gender: male Date of Admission: 02/14/2014  Date of Discharge: 02/15/14 Admitting Physician: Lupita Dawn, MD  Primary Care Provider: No PCP Per Patient Consultants: Cardiology  Indication for Hospitalization: Chest Pain  Discharge Diagnoses/Problem List:  Chest Pain secondary to Costochondritis Neck Pain Tachycardia Diabetes Mellitus HTN Hyperlipidemia  Disposition: Discharged home.  Discharge Condition: Stable.   Brief Hospital Course:  Admitted:  02/14/14       Discharged:  02/15/14 1. Chest Pain: -Evaluated by Cardiology and determined to be musculoskeletal in etiology. -Exam showed inhalation somatic dysfunction of left ribs with tenderness over left chest and neck -EKG- Sinus Tachycardia -Troponins- <0.30 x3 -Lipase- 41; rules out pancreatitis as etiology of chest pain -Received gabapentin 300mg , Tramadol 50mg  four times per day, Flexeril 5mg  TID PRN, one dose of Toradol 30mg , Acetaminophen 650mg  q4hr PRN, Nitroglycerin 0.4mg  q1min PRN  2. Neck Pain: -Cervical Vertebrae Xray- mild straightening of mid to lower cervical lordosis -Tx with medications used for chest pain  3. Tachycardia: -HR 74-103 during hospitalization -Tx with Metoprolol 100mg   4. Hyperlipidema: -Cholesterol 158, TG 571, HDL 38 -ASCVD 19.5% -Initiated Lipitor 40mg  and Aspirin 81mg   5. Diabetes Mellitus: -BG: 177, 254, 197, 237 -A1C of 8.9, up from 7.4 in January 2014 -Christopher Davila refused Novolog during hospitalization. -Metformin and Glipizide held  6. HTN: -Range of 125-147 / 75-99 during hospitalization -Tx with Amolodipine 10mg , HCTZ 12.5mg , Lisinopril 20mg , and Metoprolol 100mg   7. Tobacco Cessation -Smokes 2 cigarettes / day and attempting to quit -Tx with Nicotine patch 7mg   Issues for Follow Up:  -A1C of 8.9.  Not seen for  diabetes follow-up since January 2014, when A1C was 7.4. -Hyperlipidemia diagnosed during hospitalization with ASCVD of 19.5%.  Initiated on Lipitor 40mg . -Inhalation somatic dysfunction on left with associated somatic dysfunction in thoracic spine.  Consider OMM. -Monitor HR and BP -No insurance, but Christopher Davila denies problems obtaining medications.  Please verify he has been able to obtain his Lipitor as prescribed.  Significant Procedures: None  Significant Labs and Imaging:   Recent Labs Lab 02/14/14 1255 02/14/14 1740  WBC 9.8 10.0  HGB 13.7 13.2  HCT 41.0 38.3*  PLT 219 207    Recent Labs Lab 02/14/14 1255 02/14/14 1740  NA 136*  --   K 3.8  --   CL 95*  --   CO2 24  --   GLUCOSE 237*  --   BUN 14  --   CREATININE 0.87 0.85  CALCIUM 9.1  --    Cardiac Panel (last 3 results)  Recent Labs  02/14/14 1740 02/14/14 2223 02/15/14 0419  TROPONINI <0.30 <0.30 <0.30   Lipid Panel     Component Value Date/Time   CHOL 158 02/14/2014 1740   TRIG 571* 02/14/2014 1740   HDL 38* 02/14/2014 1740   CHOLHDL 4.2 02/14/2014 1740   VLDL UNABLE TO CALCULATE IF TRIGLYCERIDE OVER 400 mg/dL 02/14/2014 1740   LDLCALC UNABLE TO CALCULATE IF TRIGLYCERIDE OVER 400 mg/dL 02/14/2014 1740   Troponins:  <0.3 x 3 UDS- Negative BG:177, 254, 197, 237 A1C- 8.9 EKG- Sinus Tachycardia Cervical Spine Xray- No fracture or acute finding. No significant degenerative changes. Mild straightening of the mid to lower cervical lordosis.  Results/Tests Pending at Time of Discharge: None.  Discharge Medications:    Medication List         amLODipine  10 MG tablet  Commonly known as:  NORVASC  Take 10 mg by mouth daily.     aspirin 81 MG chewable tablet  Chew 1 tablet (81 mg total) by mouth daily.     atorvastatin 40 MG tablet  Commonly known as:  LIPITOR  Take 1 tablet (40 mg total) by mouth daily.     cyclobenzaprine 5 MG tablet  Commonly known as:  FLEXERIL  Take 1 tablet (5 mg total)  by mouth 3 (three) times daily as needed for muscle spasms.     gabapentin 300 MG capsule  Commonly known as:  NEURONTIN  Take 300 mg by mouth 3 (three) times daily.     glipiZIDE 10 MG tablet  Commonly known as:  GLUCOTROL  Take 10 mg by mouth daily.     lisinopril-hydrochlorothiazide 20-12.5 MG per tablet  Commonly known as:  PRINZIDE,ZESTORETIC  Take 1 tablet by mouth daily.     metFORMIN 1000 MG tablet  Commonly known as:  GLUCOPHAGE  Take 1,000 mg by mouth 2 (two) times daily with a meal.     metoCLOPramide 10 MG tablet  Commonly known as:  REGLAN  Take 10 mg by mouth every 6 (six) hours as needed for nausea (or for stomach).     metoprolol 50 MG tablet  Commonly known as:  LOPRESSOR  Take 50 mg by mouth 2 (two) times daily.     nicotine 7 mg/24hr patch  Commonly known as:  NICODERM CQ - dosed in mg/24 hr  Place 1 patch (7 mg total) onto the skin daily.        Discharge Instructions: Please refer to Patient Instructions section of EMR for full details.  Patient was counseled important signs and symptoms that should prompt return to medical care, changes in medications, dietary instructions, activity restrictions, and follow up appointments.   Follow-Up Appointments: Follow-up Information   Follow up with [place] On 02/25/2014. (@8 :30am with Dr. Lonny Prude for hospital follow-up)    Contact information:   [place] [address] [phone]      Sloan N [place], [place] 02/15/2014, 3:11 PM PGY-1, [place]

## 2014-02-15 NOTE — H&P (Signed)
FMTS Attending Note  I personally saw and evaluated the patient at 1630 on 02/14/14. The plan of care was discussed with the resident team. I agree with the assessment and plan as documented by the resident.  43 y/o male with PMH HTN, DM, Pancreatitis, and tobacco abuse presents with 4 day history of chest pain, started when he was throwing a football with a relative, describes as a sharp pain, located in left upper chest, radiates to left shoulder, has associated left arm pain and numbness, also reports left sided neck pain, acutely worsened the night of 02/13/14. No associated nausea/sob/abdominal pain. Please refer to resident note for additional HPI. No previous CAD.   Vitals: Reviewed General: Pleasant African American male, no acute distress, lying in hospital bed HEENT: Normocephalic, pupils equal in size bilaterally, no scleral icterus, nasal septum midline, no rhinorrhea, moist mucous members, uvula midline, neck was supple, no anterior or posterior cervical lymphadenopathy Cardiac: Regular rate and rhythm, S1 and S2 present, no murmurs, no heaves or thrills Respiratory: Clear to auscultation bilaterally, normal effort Abdomen: Soft, nontender, bowel sounds present Extremities: No edema, 2+ dorsalis pedis and radial pulses Neuro: Cranial nerves II through XII intact, strength 5 out of 5 in all extremities, sensation to light-touch grossly intact in all extremities, 2+ patellar reflexes bilaterally MSK: Tenderness to palpation over left pectoral muscle, deltoid, trapezius, and left cervical paraspinal muscles, neck range of motion is full, rotator cuff testing of the left shoulder was within normal limits including out of 5 strength to lift off test and empty can, pain elicited on left shoulder with Neers, Hawkins was negative, able to reach to approximately T10 of the back on the left side, this is similar to the right side  Reviewed lab work, EKG, and imaging  Assessment and plan: 43  year-old male admitted with chest pain 1. Chest pain-initial cardiac workup has been unremarkable, favor MSK etiology based on physical exam findings, will rule out ACS given risk factors, attempt trial of heat and muscle relaxants to the affected area 2. Tobacco abuse-nicotine patch provided 3. Neck pain-suspect muscle spasm of trapezius/pectoral muscle on the left/cervical paraspinal muscles, attempt trial of tramadol and Flexeril as well as heating pad 4. Type 2 diabetes - agree with insulin sliding scale while n.p.o. overnight, agree with repeat A1c, restart metformin and glipizide if no plan for further cardiac testing 5. Hypertension-currently stable, continue home blood pressure medications  Dossie Arbour M.D.

## 2014-02-17 NOTE — ED Provider Notes (Signed)
Medical screening examination/treatment/procedure(s) were conducted as a shared visit with non-physician practitioner(s) and myself.  I personally evaluated the patient during the encounter.   EKG Interpretation   Date/Time:  Monday February 14 2014 12:51:58 EDT Ventricular Rate:  107 PR Interval:  156 QRS Duration: 80 QT Interval:  338 QTC Calculation: 451 R Axis:   83 Text Interpretation:  Sinus tachycardia Non specific T wave inversions No  new changes Otherwise normal ECG Confirmed by Davon Abdelaziz, MD, Xylon Croom ([id number])  on 02/14/2014 2:35:39 PM     Pt with DM, HTN with chest pain. HEART score is 4 EKG has no acute findings. Will get tropx and admit The exam has no focal findings on cardiovascular side.  Varney Biles, MD 02/17/14 [phone]

## 2014-02-25 ENCOUNTER — Inpatient Hospital Stay: Payer: Self-pay | Admitting: Family Medicine

## 2014-03-01 ENCOUNTER — Encounter (HOSPITAL_COMMUNITY): Payer: Self-pay | Admitting: Emergency Medicine

## 2014-03-01 ENCOUNTER — Emergency Department (INDEPENDENT_AMBULATORY_CARE_PROVIDER_SITE_OTHER)
Admission: EM | Admit: 2014-03-01 | Discharge: 2014-03-01 | Disposition: A | Discharge status: Home / Self Care | Payer: Self-pay | Source: Home / Self Care | Attending: Family Medicine | Admitting: Family Medicine

## 2014-03-01 DIAGNOSIS — K047 Periapical abscess without sinus: Secondary | ICD-10-CM

## 2014-03-01 MED ORDER — DICLOFENAC POTASSIUM 50 MG PO TABS: 50.0000 mg | ORAL_TABLET | Freq: Three times a day (TID) | ORAL | Status: DC

## 2014-03-01 MED ORDER — CLINDAMYCIN HCL 300 MG PO CAPS: 300.0000 mg | ORAL_CAPSULE | Freq: Three times a day (TID) | ORAL | Status: DC

## 2014-03-01 NOTE — Discharge Instructions (Signed)
Take medicine as prescribed, see your dentist as soon as possible °

## 2014-03-01 NOTE — ED Notes (Signed)
Toothache onset 9/14.  Pain is bottom right of mouth.  Has a history of dental abscess.

## 2014-03-01 NOTE — ED Provider Notes (Signed)
CSN: [id number]     Arrival date & time 03/01/14  1215 History   First MD Initiated Contact with Patient 03/01/14 1322     Chief Complaint  Patient presents with  . Dental Pain   (Consider location/radiation/quality/duration/timing/severity/associated sxs/prior Treatment) Patient is a 43 y.o. male presenting with tooth pain. The history is provided by the patient.  Dental Pain Location:  Lower Lower teeth location:  27/RL cuspid, 28/RL 1st bicuspid, 29/RL 2nd bicuspid, 30/RL 1st molar and 32/RL 3rd molar Quality:  Throbbing Severity:  Moderate Onset quality:  Gradual Duration:  3 days Progression:  Worsening Chronicity:  New Context: abscess, dental caries and poor dentition   Relieved by:  None tried Associated symptoms: facial pain and facial swelling   Associated symptoms: no fever   Risk factors: lack of dental care and periodontal disease     Past Medical History  Diagnosis Date  . Diabetes mellitus   . Hypertension   . Neuropathy of lower extremity   . Pancreatitis 06/2012   History reviewed. No pertinent past surgical history. Family History  Problem Relation Age of Onset  . Diabetes Mother   . Diabetes Maternal Aunt    History  Substance Use Topics  . Smoking status: Current Every Day Smoker -- 0.25 packs/day    Types: Cigarettes  . Smokeless tobacco: Not on file  . Alcohol Use: No    Review of Systems  Constitutional: Negative.  Negative for fever.  HENT: Positive for dental problem and facial swelling.     Allergies  Morphine and related  Home Medications   Prior to Admission medications   Medication Sig Start Date End Date Taking? Authorizing Provider  amLODipine (NORVASC) 10 MG tablet Take 10 mg by mouth daily.    Historical Provider, MD  aspirin 81 MG chewable tablet Chew 1 tablet (81 mg total) by mouth daily. 02/15/14   [place] N Rumley, DO  atorvastatin (LIPITOR) 40 MG tablet Take 1 tablet (40 mg total) by mouth daily. 02/15/14   [place] N  Rumley, DO  clindamycin (CLEOCIN) 300 MG capsule Take 1 capsule (300 mg total) by mouth 3 (three) times daily. 03/01/14   Billy Fischer, MD  cyclobenzaprine (FLEXERIL) 5 MG tablet Take 1 tablet (5 mg total) by mouth 3 (three) times daily as needed for muscle spasms. 02/15/14   [place] N Rumley, DO  diclofenac (CATAFLAM) 50 MG tablet Take 1 tablet (50 mg total) by mouth 3 (three) times daily. For dental pain 03/01/14   Billy Fischer, MD  gabapentin (NEURONTIN) 300 MG capsule Take 300 mg by mouth 3 (three) times daily.    Historical Provider, MD  glipiZIDE (GLUCOTROL) 10 MG tablet Take 10 mg by mouth daily.    Historical Provider, MD  lisinopril-hydrochlorothiazide (PRINZIDE,ZESTORETIC) 20-12.5 MG per tablet Take 1 tablet by mouth daily.    Historical Provider, MD  metFORMIN (GLUCOPHAGE) 1000 MG tablet Take 1,000 mg by mouth 2 (two) times daily with a meal.      Historical Provider, MD  metoCLOPramide (REGLAN) 10 MG tablet Take 10 mg by mouth every 6 (six) hours as needed for nausea (or for stomach).    Historical Provider, MD  metoprolol (LOPRESSOR) 50 MG tablet Take 50 mg by mouth 2 (two) times daily.    Historical Provider, MD  nicotine (NICODERM CQ - DOSED IN MG/24 HR) 7 mg/24hr patch Place 1 patch (7 mg total) onto the skin daily. 02/15/14   [place] N Rumley, DO   BP 145/101  Pulse 125  Temp(Src) 98.2 F (36.8 C) (Oral)  Resp 16  SpO2 100% Physical Exam  Nursing note and vitals reviewed. Constitutional: He is oriented to person, place, and time. He appears well-developed and well-nourished. No distress.  HENT:  Right Ear: External ear normal.  Left Ear: External ear normal.  Mouth/Throat: Uvula is midline, oropharynx is clear and moist and mucous membranes are normal. Abnormal dentition. Dental abscesses and dental caries present.    Eyes: Conjunctivae are normal. Pupils are equal, round, and reactive to light.  Neck: Normal range of motion. Neck supple.  Lymphadenopathy:    He has  cervical adenopathy.  Neurological: He is alert and oriented to person, place, and time.  Skin: Skin is warm and dry.    ED Course  Procedures (including critical care time) Labs Review Labs Reviewed - No data to display  Imaging Review No results found.   MDM   1. Abscess, dental        Billy Fischer, MD 03/01/14 1351

## 2014-03-24 ENCOUNTER — Emergency Department (HOSPITAL_COMMUNITY): Payer: Self-pay

## 2014-03-24 ENCOUNTER — Encounter (HOSPITAL_COMMUNITY): Payer: Self-pay | Admitting: Emergency Medicine

## 2014-03-24 ENCOUNTER — Emergency Department (HOSPITAL_COMMUNITY)
Admission: EM | Admit: 2014-03-24 | Discharge: 2014-03-24 | Disposition: A | Discharge status: Home / Self Care | Payer: Self-pay | Attending: Emergency Medicine | Admitting: Emergency Medicine

## 2014-03-24 DIAGNOSIS — Z792 Long term (current) use of antibiotics: Secondary | ICD-10-CM | POA: Insufficient documentation

## 2014-03-24 DIAGNOSIS — G5790 Unspecified mononeuropathy of unspecified lower limb: Secondary | ICD-10-CM | POA: Insufficient documentation

## 2014-03-24 DIAGNOSIS — R1013 Epigastric pain: Secondary | ICD-10-CM

## 2014-03-24 DIAGNOSIS — Z79899 Other long term (current) drug therapy: Secondary | ICD-10-CM | POA: Insufficient documentation

## 2014-03-24 DIAGNOSIS — K5900 Constipation, unspecified: Secondary | ICD-10-CM | POA: Insufficient documentation

## 2014-03-24 DIAGNOSIS — E119 Type 2 diabetes mellitus without complications: Secondary | ICD-10-CM | POA: Insufficient documentation

## 2014-03-24 DIAGNOSIS — Z720 Tobacco use: Secondary | ICD-10-CM | POA: Insufficient documentation

## 2014-03-24 DIAGNOSIS — Z7982 Long term (current) use of aspirin: Secondary | ICD-10-CM | POA: Insufficient documentation

## 2014-03-24 DIAGNOSIS — Z791 Long term (current) use of non-steroidal anti-inflammatories (NSAID): Secondary | ICD-10-CM | POA: Insufficient documentation

## 2014-03-24 DIAGNOSIS — I10 Essential (primary) hypertension: Secondary | ICD-10-CM | POA: Insufficient documentation

## 2014-03-24 LAB — CBC WITH DIFFERENTIAL/PLATELET
BASOS PCT: 0 % (ref 0–1)
Basophils Absolute: 0 10*3/uL (ref 0.0–0.1)
EOS ABS: 0.2 10*3/uL (ref 0.0–0.7)
Eosinophils Relative: 3 % (ref 0–5)
HEMATOCRIT: 38.9 % — AB (ref 39.0–52.0)
HEMOGLOBIN: 13.6 g/dL (ref 13.0–17.0)
Lymphocytes Relative: 35 % (ref 12–46)
Lymphs Abs: 3.3 10*3/uL (ref 0.7–4.0)
MCH: 29.3 pg (ref 26.0–34.0)
MCHC: 35 g/dL (ref 30.0–36.0)
MCV: 83.8 fL (ref 78.0–100.0)
MONO ABS: 0.8 10*3/uL (ref 0.1–1.0)
Monocytes Relative: 8 % (ref 3–12)
NEUTROS ABS: 5.2 10*3/uL (ref 1.7–7.7)
Neutrophils Relative %: 54 % (ref 43–77)
Platelets: 207 10*3/uL (ref 150–400)
RBC: 4.64 MIL/uL (ref 4.22–5.81)
RDW: 12.6 % (ref 11.5–15.5)
WBC: 9.6 10*3/uL (ref 4.0–10.5)

## 2014-03-24 LAB — URINALYSIS, ROUTINE W REFLEX MICROSCOPIC
Bilirubin Urine: NEGATIVE
Glucose, UA: >1000 mg/dL — AB
KETONES UR: 15 mg/dL — AB
Leukocytes, UA: NEGATIVE
NITRITE: NEGATIVE
PROTEIN: 30 mg/dL — AB
SPECIFIC GRAVITY, URINE: 1.029 (ref 1.005–1.030)
Urobilinogen, UA: 1 mg/dL (ref 0.0–1.0)
pH: 5.5 (ref 5.0–8.0)

## 2014-03-24 LAB — URINE MICROSCOPIC-ADD ON

## 2014-03-24 LAB — COMPREHENSIVE METABOLIC PANEL
ALBUMIN: 4.2 g/dL (ref 3.5–5.2)
ALT: 12 U/L (ref 0–53)
ANION GAP: 15 (ref 5–15)
AST: 19 U/L (ref 0–37)
Alkaline Phosphatase: 95 U/L (ref 39–117)
BUN: 16 mg/dL (ref 6–23)
CHLORIDE: 95 meq/L — AB (ref 96–112)
CO2: 24 mEq/L (ref 19–32)
Calcium: 9.8 mg/dL (ref 8.4–10.5)
Creatinine, Ser: 0.84 mg/dL (ref 0.50–1.35)
GFR calc Af Amer: >90 mL/min (ref >90)
GFR calc non Af Amer: >90 mL/min (ref >90)
Glucose, Bld: 238 mg/dL — ABNORMAL HIGH (ref 70–99)
Potassium: 3.8 mEq/L (ref 3.7–5.3)
Sodium: 134 mEq/L — ABNORMAL LOW (ref 137–147)
Total Bilirubin: 0.3 mg/dL (ref 0.3–1.2)
Total Protein: 8.4 g/dL — ABNORMAL HIGH (ref 6.0–8.3)

## 2014-03-24 LAB — LIPASE, BLOOD: Lipase: 90 U/L — ABNORMAL HIGH (ref 11–59)

## 2014-03-24 MED ORDER — FENTANYL CITRATE 0.05 MG/ML IJ SOLN
50.0000 ug | Freq: Once | INTRAMUSCULAR | Status: AC
  Administered 2014-03-24: 50 ug via INTRAVENOUS
  Filled 2014-03-24: qty 2

## 2014-03-24 MED ORDER — SODIUM CHLORIDE 0.9 % IV BOLUS (SEPSIS)
1000.0000 mL | Freq: Once | INTRAVENOUS | Status: AC
  Administered 2014-03-24: 1000 mL via INTRAVENOUS

## 2014-03-24 NOTE — ED Notes (Signed)
Pt c/o of intermittent abdominal pain for "a while". sts after drinking a beer on Sunday he has had the pain has been more constant with nausea and constipation. Hx pancreatitis.

## 2014-03-24 NOTE — Discharge Instructions (Signed)
Abdominal Pain °Many things can cause abdominal pain. Usually, abdominal pain is not caused by a disease and will improve without treatment. It can often be observed and treated at home. Your health care provider will do a physical exam and possibly order blood tests and X-rays to help determine the seriousness of your pain. However, in many cases, more time must pass before a clear cause of the pain can be found. Before that point, your health care provider may not know if you need more testing or further treatment. °HOME CARE INSTRUCTIONS  °Monitor your abdominal pain for any changes. The following actions may help to alleviate any discomfort you are experiencing: °· Only take over-the-counter or prescription medicines as directed by your health care provider. °· Do not take laxatives unless directed to do so by your health care provider. °· Try a clear liquid diet (broth, tea, or water) as directed by your health care provider. Slowly move to a bland diet as tolerated. °SEEK MEDICAL CARE IF: °· You have unexplained abdominal pain. °· You have abdominal pain associated with nausea or diarrhea. °· You have pain when you urinate or have a bowel movement. °· You experience abdominal pain that wakes you in the night. °· You have abdominal pain that is worsened or improved by eating food. °· You have abdominal pain that is worsened with eating fatty foods. °· You have a fever. °SEEK IMMEDIATE MEDICAL CARE IF:  °· Your pain does not go away within 2 hours. °· You keep throwing up (vomiting). °· Your pain is felt only in portions of the abdomen, such as the right side or the left lower portion of the abdomen. °· You pass bloody or black tarry stools. °MAKE SURE YOU: °· Understand these instructions.   °· Will watch your condition.   °· Will get help right away if you are not doing well or get worse.   °Document Released: 03/13/2005 Document Revised: 06/08/2013 Document Reviewed: 02/10/2013 °Exit[employer]® Patient Information  ©2015 [employer]. This information is not intended to replace advice given to you by your health care provider. Make sure you discuss any questions you have with your health care provider. ° °Constipation °Constipation is when a person has fewer than three bowel movements a week, has difficulty having a bowel movement, or has stools that are dry, hard, or larger than normal. As people grow older, constipation is more common. If you try to fix constipation with medicines that make you have a bowel movement (laxatives), the problem may get worse. Long-term laxative use may cause the muscles of the colon to become weak. A low-fiber diet, not taking in enough fluids, and taking certain medicines may make constipation worse.  °CAUSES  °· Certain medicines, such as antidepressants, pain medicine, iron supplements, antacids, and water pills.   °· Certain diseases, such as diabetes, irritable bowel syndrome (IBS), thyroid disease, or depression.   °· Not drinking enough water.   °· Not eating enough fiber-rich foods.   °· Stress or travel.   °· Lack of physical activity or exercise.   °· Ignoring the urge to have a bowel movement.   °· Using laxatives too much.   °SIGNS AND SYMPTOMS  °· Having fewer than three bowel movements a week.   °· Straining to have a bowel movement.   °· Having stools that are hard, dry, or larger than normal.   °· Feeling full or bloated.   °· Pain in the lower abdomen.   °· Not feeling relief after having a bowel movement.   °DIAGNOSIS  °Your health care provider will take   a medical history and perform a physical exam. Further testing may be done for severe constipation. Some tests may include: °· A barium enema X-ray to examine your rectum, colon, and, sometimes, your small intestine.   °· A sigmoidoscopy to examine your lower colon.   °· A colonoscopy to examine your entire colon. °TREATMENT  °Treatment will depend on the severity of your constipation and what is causing it. Some dietary  treatments include drinking more fluids and eating more fiber-rich foods. Lifestyle treatments may include regular exercise. If these diet and lifestyle recommendations do not help, your health care provider may recommend taking over-the-counter laxative medicines to help you have bowel movements. Prescription medicines may be prescribed if over-the-counter medicines do not work.  °HOME CARE INSTRUCTIONS  °· Eat foods that have a lot of fiber, such as fruits, vegetables, whole grains, and beans. °· Limit foods high in fat and processed sugars, such as french fries, hamburgers, cookies, candies, and soda.   °· A fiber supplement may be added to your diet if you cannot get enough fiber from foods.   °· Drink enough fluids to keep your urine clear or pale yellow.   °· Exercise regularly or as directed by your health care provider.   °· Go to the restroom when you have the urge to go. Do not hold it.   °· Only take over-the-counter or prescription medicines as directed by your health care provider. Do not take other medicines for constipation without talking to your health care provider first.   °SEEK IMMEDIATE MEDICAL CARE IF:  °· You have bright red blood in your stool.   °· Your constipation lasts for more than 4 days or gets worse.   °· You have abdominal or rectal pain.   °· You have thin, pencil-like stools.   °· You have unexplained weight loss. °MAKE SURE YOU:  °· Understand these instructions. °· Will watch your condition. °· Will get help right away if you are not doing well or get worse. °Document Released: 03/01/2004 Document Revised: 06/08/2013 Document Reviewed: 03/15/2013 °ExitCare® Patient Information ©2015 [employer]. This information is not intended to replace advice given to you by your health care provider. Make sure you discuss any questions you have with your health care provider. ° °

## 2014-03-24 NOTE — ED Provider Notes (Signed)
CSN: [id number]     Arrival date & time 03/24/14  1618 History   First MD Initiated Contact with Patient 03/24/14 1629     Chief Complaint  Patient presents with  . Abdominal Pain     (Consider location/radiation/quality/duration/timing/severity/associated sxs/prior Treatment) HPI 43 year old male presents with abdominal pain. He states that on Sunday (today is Thursday) he had "one beer" with a friend. Next day he noticed epigastric discomfort that has progressively worsened til today. Has not felt nauseous or had any vomiting. He's had this pain recurrently. He's been told it was pancreatitis in the past but then told later that it wasn't. He has been constipated. He does feel somewhat better when he is able to get himself to go the bathroom after coffee. He then did go the next day and was able to go again today and feels somewhat improved. He took naproxen without relief. Denies any fevers or chills. No chest pain or shortness of breath. This pain is milder than when these had to be admitted to the hospital in the past and rates as an 8/10.  Past Medical History  Diagnosis Date  . Diabetes mellitus   . Hypertension   . Neuropathy of lower extremity   . Pancreatitis 06/2012   History reviewed. No pertinent past surgical history. Family History  Problem Relation Age of Onset  . Diabetes Mother   . Diabetes Maternal Aunt    History  Substance Use Topics  . Smoking status: Current Every Day Smoker -- 0.25 packs/day    Types: Cigarettes  . Smokeless tobacco: Not on file  . Alcohol Use: No    Review of Systems  Constitutional: Negative for fever.  Respiratory: Negative for shortness of breath.   Cardiovascular: Negative for chest pain.  Gastrointestinal: Positive for abdominal pain and constipation. Negative for nausea and vomiting.  Genitourinary: Negative for dysuria.  Musculoskeletal: Negative for back pain.  All other systems reviewed and are negative.     Allergies   Morphine and related  Home Medications   Prior to Admission medications   Medication Sig Start Date End Date Taking? Authorizing Provider  amLODipine (NORVASC) 10 MG tablet Take 10 mg by mouth daily.    Historical Provider, MD  aspirin 81 MG chewable tablet Chew 1 tablet (81 mg total) by mouth daily. 02/15/14   [place] N Rumley, DO  atorvastatin (LIPITOR) 40 MG tablet Take 1 tablet (40 mg total) by mouth daily. 02/15/14   [place] N Rumley, DO  clindamycin (CLEOCIN) 300 MG capsule Take 1 capsule (300 mg total) by mouth 3 (three) times daily. 03/01/14   Billy Fischer, MD  cyclobenzaprine (FLEXERIL) 5 MG tablet Take 1 tablet (5 mg total) by mouth 3 (three) times daily as needed for muscle spasms. 02/15/14   [place] N Rumley, DO  diclofenac (CATAFLAM) 50 MG tablet Take 1 tablet (50 mg total) by mouth 3 (three) times daily. For dental pain 03/01/14   Billy Fischer, MD  gabapentin (NEURONTIN) 300 MG capsule Take 300 mg by mouth 3 (three) times daily.    Historical Provider, MD  glipiZIDE (GLUCOTROL) 10 MG tablet Take 10 mg by mouth daily.    Historical Provider, MD  LISINOPRIL PO Take by mouth.    Historical Provider, MD  lisinopril-hydrochlorothiazide (PRINZIDE,ZESTORETIC) 20-12.5 MG per tablet Take 1 tablet by mouth daily.    Historical Provider, MD  metFORMIN (GLUCOPHAGE) 1000 MG tablet Take 1,000 mg by mouth 2 (two) times daily with a meal.  Historical Provider, MD  metoCLOPramide (REGLAN) 10 MG tablet Take 10 mg by mouth every 6 (six) hours as needed for nausea (or for stomach).    Historical Provider, MD  metoprolol (LOPRESSOR) 50 MG tablet Take 50 mg by mouth 2 (two) times daily.    Historical Provider, MD  Metoprolol Succinate (TOPROL XL PO) Take by mouth.    Historical Provider, MD  nicotine (NICODERM CQ - DOSED IN MG/24 HR) 7 mg/24hr patch Place 1 patch (7 mg total) onto the skin daily. 02/15/14   [place] N Rumley, DO   BP 132/85  Pulse 100  Temp(Src) 98.7 F (37.1 C) (Oral)  Resp 16  Ht  5\' 3"  (1.6 m)  Wt 148 lb (67.132 kg)  BMI 26.22 kg/m2  SpO2 97% Physical Exam  Nursing note and vitals reviewed. Constitutional: He is oriented to person, place, and time. He appears well-developed and well-nourished.  Comfortable, no distress noted  HENT:  Head: Normocephalic and atraumatic.  Right Ear: External ear normal.  Left Ear: External ear normal.  Nose: Nose normal.  Eyes: Right eye exhibits no discharge. Left eye exhibits no discharge.  Neck: Neck supple.  Cardiovascular: Normal rate, regular rhythm, normal heart sounds and intact distal pulses.   Pulmonary/Chest: Effort normal and breath sounds normal.  Abdominal: Soft. There is tenderness in the epigastric area. There is no rigidity, no rebound and no guarding. No hernia.  Neurological: He is alert and oriented to person, place, and time.  Skin: Skin is warm and dry.    ED Course  Procedures (including critical care time) Labs Review Labs Reviewed  CBC WITH DIFFERENTIAL - Abnormal; Notable for the following:    HCT 38.9 (*)    All other components within normal limits  COMPREHENSIVE METABOLIC PANEL - Abnormal; Notable for the following:    Sodium 134 (*)    Chloride 95 (*)    Glucose, Bld 238 (*)    Total Protein 8.4 (*)    All other components within normal limits  LIPASE, BLOOD - Abnormal; Notable for the following:    Lipase 90 (*)    All other components within normal limits  URINALYSIS, ROUTINE W REFLEX MICROSCOPIC - Abnormal; Notable for the following:    Glucose, UA >1000 (*)    Hgb urine dipstick MODERATE (*)    Ketones, ur 15 (*)    Protein, ur 30 (*)    All other components within normal limits  URINE MICROSCOPIC-ADD ON - Abnormal; Notable for the following:    Squamous Epithelial / LPF FEW (*)    All other components within normal limits    Imaging Review Dg Abd Acute W/chest  03/24/2014   CLINICAL DATA:  Upper midline abdominal pain for 5 days radiating to RIGHT upper quadrant, constipation,  personal history of hypertension and diabetes mellitus, smoker  EXAM: ACUTE ABDOMEN SERIES (ABDOMEN 2 VIEW & CHEST 1 VIEW)  COMPARISON:  Chest radiograph 02/14/2014, abdominal radiographs 01/20/2014  FINDINGS: Upper normal heart size.  Normal mediastinal contours and pulmonary vascularity.  Lungs clear.  No pleural effusion or pneumothorax.  Normal bowel gas pattern.  Scattered stool throughout colon.  No bowel dilatation or bowel wall thickening or free intraperitoneal air.  Scattered pelvic phleboliths.  No urinary tract calcification or acute osseous findings.  IMPRESSION: No acute abnormalities.   Electronically Signed   By: Lavonia Dana M.D.   On: 03/24/2014 17:41     EKG Interpretation None      MDM  Final diagnoses:  Epigastric pain  Constipation, unspecified constipation type    Patient with nonspecific epigastric pain. Has mild lipase elevation but is still less than 100. He otherwise appears well, no elevated white blood cell count or other concerning findings. He does have significant constipation as well. We'll recommend laxatives and stool softeners at home. At this point his pain is very well-controlled and I do not feel he needs admission for IV pain control. Discussed plenty of fluids and a more liquid diet and followup with his PCP. Patient feels he will be fine with naproxen and Tylenol at home.    Ephraim Hamburger, MD 03/24/14 [phone]

## 2014-03-26 NOTE — ED Notes (Signed)
Fentanyl 50 mcg accidentally took home by this Probation officer and brought back, verified by second RN Lewis Shock and taken to pharmacy.

## 2014-05-04 ENCOUNTER — Emergency Department (HOSPITAL_COMMUNITY): Payer: Self-pay

## 2014-05-04 ENCOUNTER — Emergency Department (HOSPITAL_COMMUNITY)
Admission: EM | Admit: 2014-05-04 | Discharge: 2014-05-04 | Disposition: A | Discharge status: Home / Self Care | Payer: Self-pay | Attending: Emergency Medicine | Admitting: Emergency Medicine

## 2014-05-04 ENCOUNTER — Encounter (HOSPITAL_COMMUNITY): Payer: Self-pay | Admitting: *Deleted

## 2014-05-04 DIAGNOSIS — G5790 Unspecified mononeuropathy of unspecified lower limb: Secondary | ICD-10-CM | POA: Insufficient documentation

## 2014-05-04 DIAGNOSIS — Y9289 Other specified places as the place of occurrence of the external cause: Secondary | ICD-10-CM | POA: Insufficient documentation

## 2014-05-04 DIAGNOSIS — Y9389 Activity, other specified: Secondary | ICD-10-CM | POA: Insufficient documentation

## 2014-05-04 DIAGNOSIS — Z8719 Personal history of other diseases of the digestive system: Secondary | ICD-10-CM | POA: Insufficient documentation

## 2014-05-04 DIAGNOSIS — S6991XA Unspecified injury of right wrist, hand and finger(s), initial encounter: Secondary | ICD-10-CM | POA: Insufficient documentation

## 2014-05-04 DIAGNOSIS — W208XXA Other cause of strike by thrown, projected or falling object, initial encounter: Secondary | ICD-10-CM | POA: Insufficient documentation

## 2014-05-04 DIAGNOSIS — R52 Pain, unspecified: Secondary | ICD-10-CM

## 2014-05-04 DIAGNOSIS — I10 Essential (primary) hypertension: Secondary | ICD-10-CM | POA: Insufficient documentation

## 2014-05-04 DIAGNOSIS — E119 Type 2 diabetes mellitus without complications: Secondary | ICD-10-CM | POA: Insufficient documentation

## 2014-05-04 DIAGNOSIS — Z79899 Other long term (current) drug therapy: Secondary | ICD-10-CM | POA: Insufficient documentation

## 2014-05-04 DIAGNOSIS — Y998 Other external cause status: Secondary | ICD-10-CM | POA: Insufficient documentation

## 2014-05-04 DIAGNOSIS — Z720 Tobacco use: Secondary | ICD-10-CM | POA: Insufficient documentation

## 2014-05-04 MED ORDER — IBUPROFEN 800 MG PO TABS: 800.0000 mg | ORAL_TABLET | Freq: Three times a day (TID) | ORAL | Status: DC | PRN

## 2014-05-04 NOTE — Discharge Instructions (Signed)
RICE: Routine Care for Injuries The routine care of many injuries includes Rest, Ice, Compression, and Elevation (RICE). HOME CARE INSTRUCTIONS  Rest is needed to allow your body to heal. Routine activities can usually be resumed when comfortable. Injured tendons and bones can take up to 6 weeks to heal. Tendons are the cord-like structures that attach muscle to bone.  Ice following an injury helps keep the swelling down and reduces pain.  Put ice in a plastic bag.  Place a towel between your skin and the bag.  Leave the ice on for 15-20 minutes, 3-4 times a day, or as directed by your health care provider. Do this while awake, for the first 24 to 48 hours. After that, continue as directed by your caregiver.  Compression helps keep swelling down. It also gives support and helps with discomfort. If an elastic bandage has been applied, it should be removed and reapplied every 3 to 4 hours. It should not be applied tightly, but firmly enough to keep swelling down. Watch fingers or toes for swelling, bluish discoloration, coldness, numbness, or excessive pain. If any of these problems occur, remove the bandage and reapply loosely. Contact your caregiver if these problems continue.  Elevation helps reduce swelling and decreases pain. With extremities, such as the arms, hands, legs, and feet, the injured area should be placed near or above the level of the heart, if possible. SEEK IMMEDIATE MEDICAL CARE IF:  You have persistent pain and swelling.  You develop redness, numbness, or unexpected weakness.  Your symptoms are getting worse rather than improving after several days. These symptoms may indicate that further evaluation or further X-rays are needed. Sometimes, X-rays may not show a small broken bone (fracture) until 1 week or 10 days later. Make a follow-up appointment with your caregiver. Ask when your X-ray results will be ready. Make sure you get your X-ray results. Document Released:  09/15/2000 Document Revised: 06/08/2013 Document Reviewed: 11/02/2010 Exit[employer] Patient Information 2015 [employer]. This information is not intended to replace advice given to you by your health care provider. Make sure you discuss any questions you have with your health care provider.  

## 2014-05-04 NOTE — ED Notes (Signed)
Rt. Hand for 3 days. Pt. Dropped a weight on it. Pt. Splinted the index and pinky because of limited movement and pain. No numbness and cap refill wnl.

## 2014-05-04 NOTE — ED Provider Notes (Signed)
CSN: [id number]     Arrival date & time 05/04/14  [id number] History  This chart was scribed for non-physician practitioner, Domenic Moras, PA-C working with Ezequiel Essex, MD by Frederich Balding, ED scribe. This patient was seen in room TR08C/TR08C and the patient's care was started at 9:38 AM.   Chief Complaint  Patient presents with  . Hand Pain   The history is provided by the patient. No language interpreter was used.    HPI Comments: Christopher Davila is a 43 y.o. male who presents to the Emergency Department complaining of sudden onset right hand pain that started 3 days ago after his brother accidentally dropped a 15-pound weight on it. The weight was only dropped from about one foot onto his palm and fifth finger. Rates pain 8/10 and states movement worsens it. He has taken tylenol with no relief. States he has only taken two pills once. Denies wrist pain, elbow pain, numbness. Pt is right hand dominant.   Past Medical History  Diagnosis Date  . Diabetes mellitus   . Hypertension   . Neuropathy of lower extremity   . Pancreatitis 06/2012   History reviewed. No pertinent past surgical history. Family History  Problem Relation Age of Onset  . Diabetes Mother   . Diabetes Maternal Aunt    History  Substance Use Topics  . Smoking status: Current Every Day Smoker -- 0.25 packs/day    Types: Cigarettes  . Smokeless tobacco: Not on file  . Alcohol Use: Yes    Review of Systems  Musculoskeletal: Positive for arthralgias.  Neurological: Negative for numbness.  All other systems reviewed and are negative.  Allergies  Morphine and related  Home Medications   Prior to Admission medications   Medication Sig Start Date End Date Taking? Authorizing Provider  amLODipine (NORVASC) 10 MG tablet Take 10 mg by mouth daily.    Historical Provider, MD  aspirin 81 MG chewable tablet Chew 1 tablet (81 mg total) by mouth daily. 02/15/14   [place] N Rumley, DO  atorvastatin (LIPITOR) 40 MG tablet Take  1 tablet (40 mg total) by mouth daily. 02/15/14   [place] N Rumley, DO  gabapentin (NEURONTIN) 300 MG capsule Take 300 mg by mouth 3 (three) times daily.    Historical Provider, MD  glipiZIDE (GLUCOTROL) 10 MG tablet Take 10 mg by mouth daily.    Historical Provider, MD  lisinopril-hydrochlorothiazide (PRINZIDE,ZESTORETIC) 20-12.5 MG per tablet Take 1 tablet by mouth daily.    Historical Provider, MD  metFORMIN (GLUCOPHAGE) 1000 MG tablet Take 1,000 mg by mouth 2 (two) times daily with a meal.      Historical Provider, MD  metoprolol (LOPRESSOR) 50 MG tablet Take 50 mg by mouth 2 (two) times daily.    Historical Provider, MD   BP 128/81 mmHg  Pulse 98  Temp(Src) 98.1 F (36.7 C) (Oral)  Resp 12  SpO2 95%   Physical Exam  Constitutional: He is oriented to person, place, and time. He appears well-developed and well-nourished. No distress.  HENT:  Head: Normocephalic and atraumatic.  Eyes: Conjunctivae and EOM are normal.  Neck: Neck supple. No tracheal deviation present.  Cardiovascular: Normal rate.   Pulmonary/Chest: Effort normal. No respiratory distress.  Musculoskeletal: Normal range of motion.  Right hand tenderness to MCP and PIP. Brisk capillary refill. Moves all fingers. No ecchymosis noted. Right wrist and elbow normal. Sensation intact.  Neurological: He is alert and oriented to person, place, and time.  Skin: Skin is warm and dry.  Psychiatric: He has a normal mood and affect. His behavior is normal.  Nursing note and vitals reviewed.   ED Course  Procedures (including critical care time)  DIAGNOSTIC STUDIES: Oxygen Saturation is 95% on RA, adequate by my interpretation.    COORDINATION OF CARE: 9:40 AM-Discussed treatment plan which includes xray with pt at bedside and pt agreed to plan.   9:47 AM Xray neg for acute fx.  Reassurance given.    Labs Review Labs Reviewed - No data to display  Imaging Review Dg Hand Complete Right  05/04/2014   CLINICAL DATA:  INJ  TO RT HAND DROPPED THE WEIGHT ON HIS RT 4TH/5TH DIGITS  EXAM: RIGHT HAND - COMPLETE 3+ VIEW  COMPARISON:  None.  FINDINGS: There is no evidence of fracture or dislocation. There is no evidence of arthropathy or other focal bone abnormality. Soft tissues are unremarkable.  IMPRESSION: No acute osseous injury of the right hand.   Electronically Signed   By: Kathreen Devoid   On: 05/04/2014 09:39     EKG Interpretation None      MDM   Final diagnoses:  Pain  Hand injury, right, initial encounter    BP 128/81 mmHg  Pulse 98  Temp(Src) 98.1 F (36.7 C) (Oral)  Resp 12  SpO2 95%   I personally performed the services described in this documentation, which was scribed in my presence. The recorded information has been reviewed and is accurate.  Domenic Moras, PA-C 05/04/14 [place], MD 05/04/14 [phone]

## 2014-07-27 ENCOUNTER — Encounter (HOSPITAL_COMMUNITY): Payer: Self-pay | Admitting: Emergency Medicine

## 2014-07-27 ENCOUNTER — Emergency Department (HOSPITAL_COMMUNITY)
Admission: EM | Admit: 2014-07-27 | Discharge: 2014-07-27 | Disposition: A | Discharge status: Home / Self Care | Payer: Self-pay | Attending: Emergency Medicine | Admitting: Emergency Medicine

## 2014-07-27 DIAGNOSIS — Z79899 Other long term (current) drug therapy: Secondary | ICD-10-CM | POA: Insufficient documentation

## 2014-07-27 DIAGNOSIS — Z720 Tobacco use: Secondary | ICD-10-CM | POA: Insufficient documentation

## 2014-07-27 DIAGNOSIS — K5900 Constipation, unspecified: Secondary | ICD-10-CM | POA: Insufficient documentation

## 2014-07-27 DIAGNOSIS — R630 Anorexia: Secondary | ICD-10-CM | POA: Insufficient documentation

## 2014-07-27 DIAGNOSIS — R1033 Periumbilical pain: Secondary | ICD-10-CM | POA: Insufficient documentation

## 2014-07-27 DIAGNOSIS — R110 Nausea: Secondary | ICD-10-CM | POA: Insufficient documentation

## 2014-07-27 DIAGNOSIS — R42 Dizziness and giddiness: Secondary | ICD-10-CM | POA: Insufficient documentation

## 2014-07-27 DIAGNOSIS — R1013 Epigastric pain: Secondary | ICD-10-CM | POA: Insufficient documentation

## 2014-07-27 DIAGNOSIS — I10 Essential (primary) hypertension: Secondary | ICD-10-CM | POA: Insufficient documentation

## 2014-07-27 DIAGNOSIS — G5790 Unspecified mononeuropathy of unspecified lower limb: Secondary | ICD-10-CM | POA: Insufficient documentation

## 2014-07-27 DIAGNOSIS — E119 Type 2 diabetes mellitus without complications: Secondary | ICD-10-CM | POA: Insufficient documentation

## 2014-07-27 DIAGNOSIS — Z7982 Long term (current) use of aspirin: Secondary | ICD-10-CM | POA: Insufficient documentation

## 2014-07-27 LAB — CBC WITH DIFFERENTIAL/PLATELET
BASOS ABS: 0 10*3/uL (ref 0.0–0.1)
Basophils Relative: 0 % (ref 0–1)
EOS ABS: 0.2 10*3/uL (ref 0.0–0.7)
EOS PCT: 2 % (ref 0–5)
HCT: 42.2 % (ref 39.0–52.0)
Hemoglobin: 14.1 g/dL (ref 13.0–17.0)
LYMPHS PCT: 31 % (ref 12–46)
Lymphs Abs: 2.9 10*3/uL (ref 0.7–4.0)
MCH: 28.9 pg (ref 26.0–34.0)
MCHC: 33.4 g/dL (ref 30.0–36.0)
MCV: 86.5 fL (ref 78.0–100.0)
Monocytes Absolute: 0.5 10*3/uL (ref 0.1–1.0)
Monocytes Relative: 5 % (ref 3–12)
Neutro Abs: 5.8 10*3/uL (ref 1.7–7.7)
Neutrophils Relative %: 62 % (ref 43–77)
PLATELETS: 203 10*3/uL (ref 150–400)
RBC: 4.88 MIL/uL (ref 4.22–5.81)
RDW: 12.9 % (ref 11.5–15.5)
WBC: 9.3 10*3/uL (ref 4.0–10.5)

## 2014-07-27 LAB — URINALYSIS, ROUTINE W REFLEX MICROSCOPIC
Bilirubin Urine: NEGATIVE
Ketones, ur: NEGATIVE mg/dL
LEUKOCYTES UA: NEGATIVE
NITRITE: NEGATIVE
PH: 5 (ref 5.0–8.0)
Protein, ur: NEGATIVE mg/dL
Specific Gravity, Urine: 1.031 — ABNORMAL HIGH (ref 1.005–1.030)
UROBILINOGEN UA: 0.2 mg/dL (ref 0.0–1.0)

## 2014-07-27 LAB — COMPREHENSIVE METABOLIC PANEL
ALT: 11 U/L (ref 0–53)
AST: 17 U/L (ref 0–37)
Albumin: 3.8 g/dL (ref 3.5–5.2)
Alkaline Phosphatase: 98 U/L (ref 39–117)
Anion gap: 11 (ref 5–15)
BUN: 11 mg/dL (ref 6–23)
CALCIUM: 9.4 mg/dL (ref 8.4–10.5)
CO2: 22 mmol/L (ref 19–32)
CREATININE: 0.85 mg/dL (ref 0.50–1.35)
Chloride: 100 mmol/L (ref 96–112)
Glucose, Bld: 403 mg/dL — ABNORMAL HIGH (ref 70–99)
Potassium: 3.7 mmol/L (ref 3.5–5.1)
SODIUM: 133 mmol/L — AB (ref 135–145)
Total Bilirubin: 0.4 mg/dL (ref 0.3–1.2)
Total Protein: 7.5 g/dL (ref 6.0–8.3)

## 2014-07-27 LAB — URINE MICROSCOPIC-ADD ON

## 2014-07-27 LAB — LIPASE, BLOOD: Lipase: 74 U/L — ABNORMAL HIGH (ref 11–59)

## 2014-07-27 MED ORDER — ACETAMINOPHEN 325 MG PO TABS
650.0000 mg | ORAL_TABLET | Freq: Once | ORAL | Status: AC
  Administered 2014-07-27: 650 mg via ORAL
  Filled 2014-07-27: qty 2

## 2014-07-27 MED ORDER — METFORMIN HCL 500 MG PO TABS: 500.0000 mg | ORAL_TABLET | Freq: Two times a day (BID) | ORAL | Status: DC

## 2014-07-27 NOTE — ED Provider Notes (Signed)
CSN: [id number]     Arrival date & time 07/27/14  1148 History   First MD Initiated Contact with Patient 07/27/14 1218     Chief Complaint  Patient presents with  . Abdominal Pain     (Consider location/radiation/quality/duration/timing/severity/associated sxs/prior Treatment) HPI Comments: Pt is a 44 y.o. male with PMH significant for DM, recurrent abdominal pain. Pt states about 3 days ago develop abdominal pain that was initially intermittent but became constant yesterday, located around the middle of his stomach. Rates as 8/10 currently. He denies any trauma, was not doing anything at the time the pain developed. It is not made worse with position, not worse with eating/drinking. He has a history of reflux, but does not feel this is the same. He has tried naproxen without relief. Last BM was today, normal. He says he is followed by [facility] for his diabetes, recently restarted his metformin in the past week (1000mg  twice a day); does say he has had GI issues in the past with metformin. He was told to follow up with a GI doctor but never saw anyone.   Smokes 1 cigarette a day, occasional alcohol use.  Patient is a 44 y.o. male presenting with abdominal pain. The history is provided by the patient. No language interpreter was used.  Abdominal Pain Pain location:  Epigastric and periumbilical Pain quality: cramping and dull   Pain radiates to:  Does not radiate Pain severity:  Moderate Onset quality:  Sudden Duration:  3 days Timing:  Constant Progression:  Unchanged Chronicity:  Recurrent Context: not alcohol use (had ~4oz Sunday), not eating, not previous surgeries, not recent illness and not sick contacts   Relieved by:  Nothing Exacerbated by: none. Ineffective treatments:  Lying down, eating and antacids Associated symptoms: anorexia, constipation and nausea   Associated symptoms: no chest pain, no chills, no diarrhea, no dysuria, no fever, no hematochezia, no melena, no shortness of  breath and no vomiting   Risk factors: NSAID use   Risk factors: no alcohol abuse     Past Medical History  Diagnosis Date  . Diabetes mellitus   . Hypertension   . Neuropathy of lower extremity   . Pancreatitis 06/2012   History reviewed. No pertinent past surgical history. Family History  Problem Relation Age of Onset  . Diabetes Mother   . Diabetes Maternal Aunt    History  Substance Use Topics  . Smoking status: Current Every Day Smoker -- 0.25 packs/day    Types: Cigarettes  . Smokeless tobacco: Not on file  . Alcohol Use: Yes    Review of Systems  Constitutional: Negative for fever and chills.  Respiratory: Negative for shortness of breath.   Cardiovascular: Negative for chest pain.  Gastrointestinal: Positive for nausea, abdominal pain, constipation and anorexia. Negative for vomiting, diarrhea, melena and hematochezia.  Genitourinary: Positive for urgency. Negative for dysuria.  Musculoskeletal: Negative for back pain.  Skin: Negative for rash.  Neurological: Positive for dizziness and light-headedness. Negative for syncope and numbness.  Psychiatric/Behavioral: Negative for confusion.  All other systems reviewed and are negative.     Allergies  Morphine and related  Home Medications   Prior to Admission medications   Medication Sig Start Date End Date Taking? Authorizing Provider  amLODipine (NORVASC) 10 MG tablet Take 10 mg by mouth daily.    Historical Provider, MD  aspirin 81 MG chewable tablet Chew 1 tablet (81 mg total) by mouth daily. 02/15/14   [place] N Rumley, DO  atorvastatin (LIPITOR)  40 MG tablet Take 1 tablet (40 mg total) by mouth daily. 02/15/14   [place] N Rumley, DO  gabapentin (NEURONTIN) 300 MG capsule Take 300 mg by mouth 3 (three) times daily.    Historical Provider, MD  glipiZIDE (GLUCOTROL) 10 MG tablet Take 10 mg by mouth daily.    Historical Provider, MD  ibuprofen (ADVIL,MOTRIN) 800 MG tablet Take 1 tablet (800 mg total) by mouth  every 8 (eight) hours as needed for moderate pain. 05/04/14   Domenic Moras, PA-C  lisinopril-hydrochlorothiazide (PRINZIDE,ZESTORETIC) 20-12.5 MG per tablet Take 1 tablet by mouth daily.    Historical Provider, MD  metFORMIN (GLUCOPHAGE) 500 MG tablet Take 1 tablet (500 mg total) by mouth 2 (two) times daily with a meal. 07/27/14   Leone Brand, MD  metoprolol (LOPRESSOR) 50 MG tablet Take 50 mg by mouth 2 (two) times daily.    Historical Provider, MD   BP 114/80 mmHg  Pulse 80  Temp(Src) 98.4 F (36.9 C) (Oral)  Resp 20  SpO2 99% Physical Exam  Constitutional: He is oriented to person, place, and time. He appears well-developed and well-nourished. No distress.  HENT:  Head: Normocephalic and atraumatic.  Eyes: EOM are normal. Pupils are equal, round, and reactive to light.  Cardiovascular: Normal rate, regular rhythm, normal heart sounds and intact distal pulses.  Exam reveals no gallop and no friction rub.   No murmur heard. Pulmonary/Chest: Effort normal and breath sounds normal. No respiratory distress. He has no wheezes. He has no rales.  Abdominal: Soft. Bowel sounds are normal. He exhibits no distension and no mass. There is tenderness (epigastric/above umbilicus). There is no rebound and no guarding.  Musculoskeletal: He exhibits no edema or tenderness.  Neurological: He is alert and oriented to person, place, and time. No cranial nerve deficit.  Skin: Skin is warm and dry. No rash noted.  Psychiatric: He has a normal mood and affect. His behavior is normal. Judgment and thought content normal.  Nursing note and vitals reviewed.   ED Course  Procedures (including critical care time) Labs Review Labs Reviewed  COMPREHENSIVE METABOLIC PANEL - Abnormal; Notable for the following:    Sodium 133 (*)    Glucose, Bld 403 (*)    All other components within normal limits  LIPASE, BLOOD - Abnormal; Notable for the following:    Lipase 74 (*)    All other components within normal  limits  URINALYSIS, ROUTINE W REFLEX MICROSCOPIC - Abnormal; Notable for the following:    Specific Gravity, Urine 1.031 (*)    Glucose, UA >1000 (*)    Hgb urine dipstick SMALL (*)    All other components within normal limits  CBC WITH DIFFERENTIAL/PLATELET  URINE MICROSCOPIC-ADD ON    Imaging Review No results found.   EKG Interpretation   Date/Time:  Wednesday July 27 2014 11:53:53 EST Ventricular Rate:  84 PR Interval:  160 QRS Duration: 98 QT Interval:  352 QTC Calculation: 415 R Axis:   66 Text Interpretation:  Normal sinus rhythm Abnormal QRS-T angle, consider  primary T wave abnormality Abnormal ECG Confirmed by ZAVITZ  MD, JOSHUA  ([id number]) on 07/27/2014 1:25:36 PM      MDM   Final diagnoses:  Epigastric pain   Lab workup largely negative except for glucose 400, glucosuria. Discussed with patient need to avoid sugar/carbs in diet.   Abdominal pain has multiple possible etiologies including medication side effect from metformin (recently restarted), GERD, diabetic gastroparesis (less likely), chronic pancreatitis (has persistently  elevated lipase). Instructed to decrease metformin and titrate slowly, take nexium daily, establish with PCP and try to get GI referral along with better management of his diabetes.    Leone Brand, MD 07/27/14 1637  Mariea Clonts, MD 07/27/14 [phone]

## 2014-07-27 NOTE — Discharge Instructions (Signed)
There are a few things that your abdominal pain could be from: metformin, gastroparesis (as talked about), heartburn/GERD  It is very important for you to limit your carbohydrate and sugar intake, as your sugars are elevated here in the ED. You should cut out all sodas, juices, sugary sweets, and also limit carbs like breads, pastas, and rice.  Decrease the metformin to 500mg  once a day for the next 5-7 days, then increase to 500mg  twice a day. If you do okay with this dose for 1 week increase to 1000mg . Take your heartburn medicine/Nexium daily for the next 2 weeks  Establish with a regular doctor, some information is below. You should be seen by a GI doctor.  No Primary Care Doctor:  Call [facility] Connect at [phone] - they can help you locate a primary care doctor that accepts your insurance, provides certain services, etc.  Physician Referral [phone] Medication Assistance:  Organization Address Phone Notes  [facility] Medication Assistance [facility]  [address] [place], [place] [address]  [phone]  --Must be a resident of [facility]  -- Must have NO insurance coverage whatsoever (no Medicaid/ Medicare, etc.)  -- The pt. MUST have a primary care doctor that directs their care regularly and follows them in the community   [facility]   [phone]    [employer]   [phone]    Agencies that provide inexpensive medical care:  Organization Address Phone Notes  [place]   [phone]    [facility]   [phone]    [facility]  [place], [place] [address]  [phone]    [place] [address]  [place]  [phone]    [facility]anned Parenthood   [phone]    [facility]   [phone]    [place] and [place] [address], [place]  Phone: [phone], Fax: [phone]  Hours of  Operation: 9 am - 6 pm, M-F. Also accepts Medicaid/Medicare and self-pay.   [facility] for [place] [address], [address], [place]  Phone: [phone], Fax: [phone].  Hours of Operation: 8:30 am - 5:30 pm, M-F. Also accepts Medicaid and self-pay.   [facility]  [address] [place]  Phone: [phone]    [place], [place], [place]  [phone], Ext. [phone]  Mondays & Thursdays: 7-9 AM. First 15 patients are seen on a first come, first serve basis.   [place] Providers:  Organization Address Phone Notes  [facility]  [address], [place]  [phone]  Also accepts self-pay patients.   [facility]  [id number] [place], [place]  [phone]    [place], [address], [place]  [phone]    [facility]  [address], [place]  [phone]    Lucianne Lei  [address], [place]  [phone]  Only accepts [place] patients after they have their name applied to their card.   Self-Pay (no insurance) in [facility]:  Organization Address Phone Notes  Sickle Cell Patients, [facility]  [place], [place]  [phone]  [facility]  [place]  [phone]    [facility] [place]  [place], [address], [place]  [phone]    [facility]/Dr. Osei-Bonsu  [address] [place] or [address], [place]  [phone]  Phone number for both [place] and [place] locations is the same.   [facility]  [address] [place]  [phone]    [place], [place] or [address]  [phone]  [phone]    [facility]  [address], [place]  [phone], phone; [phone], fax   Sees patients 1st and 3rd Saturday of every month. Must not qualify for public or private insurance (i.e. Medicaid, Medicare, [place] Health Choice, Veterans' Benefits)   Household income should be no more than 200% of the poverty level The clinic cannot treat you if you are pregnant or think you are pregnant  Sexually transmitted diseases are not treated at the clinic.

## 2014-07-27 NOTE — ED Notes (Signed)
abd pain, epigastric pain started two days ago, painful to the touch, denies vomiting or diarrhea. Pt able to eat without difficulty.

## 2014-09-07 ENCOUNTER — Encounter (HOSPITAL_COMMUNITY): Payer: Self-pay | Admitting: Emergency Medicine

## 2014-09-07 ENCOUNTER — Emergency Department (HOSPITAL_COMMUNITY)
Admission: EM | Admit: 2014-09-07 | Discharge: 2014-09-07 | Disposition: A | Discharge status: Home / Self Care | Payer: Self-pay | Attending: Emergency Medicine | Admitting: Emergency Medicine

## 2014-09-07 DIAGNOSIS — I10 Essential (primary) hypertension: Secondary | ICD-10-CM | POA: Insufficient documentation

## 2014-09-07 DIAGNOSIS — R112 Nausea with vomiting, unspecified: Secondary | ICD-10-CM | POA: Insufficient documentation

## 2014-09-07 DIAGNOSIS — Z720 Tobacco use: Secondary | ICD-10-CM | POA: Insufficient documentation

## 2014-09-07 DIAGNOSIS — E119 Type 2 diabetes mellitus without complications: Secondary | ICD-10-CM | POA: Insufficient documentation

## 2014-09-07 DIAGNOSIS — R1010 Upper abdominal pain, unspecified: Secondary | ICD-10-CM | POA: Insufficient documentation

## 2014-09-07 LAB — I-STAT CHEM 8, ED
BUN: 12 mg/dL (ref 6–23)
CREATININE: 0.8 mg/dL (ref 0.50–1.35)
Calcium, Ion: 1.21 mmol/L (ref 1.12–1.23)
Chloride: 97 mmol/L (ref 96–112)
Glucose, Bld: 278 mg/dL — ABNORMAL HIGH (ref 70–99)
HCT: 47 % (ref 39.0–52.0)
HEMOGLOBIN: 16 g/dL (ref 13.0–17.0)
POTASSIUM: 3.4 mmol/L — AB (ref 3.5–5.1)
Sodium: 136 mmol/L (ref 135–145)
TCO2: 23 mmol/L (ref 0–100)

## 2014-09-07 LAB — CBC WITH DIFFERENTIAL/PLATELET
BASOS PCT: 0 % (ref 0–1)
Basophils Absolute: 0 10*3/uL (ref 0.0–0.1)
Eosinophils Absolute: 0.2 10*3/uL (ref 0.0–0.7)
Eosinophils Relative: 2 % (ref 0–5)
HEMATOCRIT: 42 % (ref 39.0–52.0)
Hemoglobin: 14.5 g/dL (ref 13.0–17.0)
Lymphocytes Relative: 32 % (ref 12–46)
Lymphs Abs: 3.5 10*3/uL (ref 0.7–4.0)
MCH: 29.4 pg (ref 26.0–34.0)
MCHC: 34.5 g/dL (ref 30.0–36.0)
MCV: 85 fL (ref 78.0–100.0)
MONO ABS: 0.8 10*3/uL (ref 0.1–1.0)
Monocytes Relative: 8 % (ref 3–12)
NEUTROS ABS: 6.3 10*3/uL (ref 1.7–7.7)
NEUTROS PCT: 58 % (ref 43–77)
Platelets: 227 10*3/uL (ref 150–400)
RBC: 4.94 MIL/uL (ref 4.22–5.81)
RDW: 13 % (ref 11.5–15.5)
WBC: 10.9 10*3/uL — ABNORMAL HIGH (ref 4.0–10.5)

## 2014-09-07 LAB — LIPASE, BLOOD: LIPASE: 100 U/L — AB (ref 11–59)

## 2014-09-07 NOTE — ED Notes (Signed)
No answer in waiting room to take to treatment room.

## 2014-09-07 NOTE — ED Notes (Signed)
No answer in waiting area.

## 2014-09-07 NOTE — ED Notes (Signed)
Pt c/o upper abd pain with nausea and vomiting x's 3 days.  Denies diarrhea

## 2014-09-07 NOTE — ED Notes (Signed)
No answer in waiting room for re-eval.

## 2014-09-08 ENCOUNTER — Encounter (HOSPITAL_COMMUNITY): Payer: Self-pay | Admitting: Emergency Medicine

## 2014-09-08 ENCOUNTER — Emergency Department (HOSPITAL_COMMUNITY)
Admission: EM | Admit: 2014-09-08 | Discharge: 2014-09-08 | Disposition: A | Discharge status: Home / Self Care | Payer: Self-pay | Attending: Emergency Medicine | Admitting: Emergency Medicine

## 2014-09-08 ENCOUNTER — Emergency Department (HOSPITAL_COMMUNITY): Payer: Self-pay

## 2014-09-08 DIAGNOSIS — I10 Essential (primary) hypertension: Secondary | ICD-10-CM | POA: Insufficient documentation

## 2014-09-08 DIAGNOSIS — Z720 Tobacco use: Secondary | ICD-10-CM | POA: Insufficient documentation

## 2014-09-08 DIAGNOSIS — Z7982 Long term (current) use of aspirin: Secondary | ICD-10-CM | POA: Insufficient documentation

## 2014-09-08 DIAGNOSIS — K859 Acute pancreatitis, unspecified: Secondary | ICD-10-CM | POA: Insufficient documentation

## 2014-09-08 DIAGNOSIS — E119 Type 2 diabetes mellitus without complications: Secondary | ICD-10-CM | POA: Insufficient documentation

## 2014-09-08 DIAGNOSIS — G629 Polyneuropathy, unspecified: Secondary | ICD-10-CM | POA: Insufficient documentation

## 2014-09-08 DIAGNOSIS — R1011 Right upper quadrant pain: Secondary | ICD-10-CM

## 2014-09-08 DIAGNOSIS — Z79899 Other long term (current) drug therapy: Secondary | ICD-10-CM | POA: Insufficient documentation

## 2014-09-08 LAB — URINE MICROSCOPIC-ADD ON

## 2014-09-08 LAB — CBC WITH DIFFERENTIAL/PLATELET
Basophils Absolute: 0 10*3/uL (ref 0.0–0.1)
Basophils Relative: 0 % (ref 0–1)
Eosinophils Absolute: 0.2 10*3/uL (ref 0.0–0.7)
Eosinophils Relative: 2 % (ref 0–5)
HCT: 43 % (ref 39.0–52.0)
HEMOGLOBIN: 14.1 g/dL (ref 13.0–17.0)
LYMPHS ABS: 3.4 10*3/uL (ref 0.7–4.0)
LYMPHS PCT: 32 % (ref 12–46)
MCH: 28.7 pg (ref 26.0–34.0)
MCHC: 32.8 g/dL (ref 30.0–36.0)
MCV: 87.4 fL (ref 78.0–100.0)
MONOS PCT: 8 % (ref 3–12)
Monocytes Absolute: 0.9 10*3/uL (ref 0.1–1.0)
Neutro Abs: 6.1 10*3/uL (ref 1.7–7.7)
Neutrophils Relative %: 58 % (ref 43–77)
Platelets: 213 10*3/uL (ref 150–400)
RBC: 4.92 MIL/uL (ref 4.22–5.81)
RDW: 13.1 % (ref 11.5–15.5)
WBC: 10.5 10*3/uL (ref 4.0–10.5)

## 2014-09-08 LAB — COMPREHENSIVE METABOLIC PANEL
ALT: 16 U/L (ref 0–53)
AST: 21 U/L (ref 0–37)
Albumin: 4 g/dL (ref 3.5–5.2)
Alkaline Phosphatase: 84 U/L (ref 39–117)
Anion gap: 11 (ref 5–15)
BILIRUBIN TOTAL: 0.7 mg/dL (ref 0.3–1.2)
BUN: 14 mg/dL (ref 6–23)
CALCIUM: 9.8 mg/dL (ref 8.4–10.5)
CHLORIDE: 99 mmol/L (ref 96–112)
CO2: 27 mmol/L (ref 19–32)
Creatinine, Ser: 0.98 mg/dL (ref 0.50–1.35)
GFR calc Af Amer: >90 mL/min (ref >90)
Glucose, Bld: 268 mg/dL — ABNORMAL HIGH (ref 70–99)
POTASSIUM: 3.8 mmol/L (ref 3.5–5.1)
SODIUM: 137 mmol/L (ref 135–145)
Total Protein: 7.8 g/dL (ref 6.0–8.3)

## 2014-09-08 LAB — URINALYSIS, ROUTINE W REFLEX MICROSCOPIC
Bilirubin Urine: NEGATIVE
Glucose, UA: >1000 mg/dL — AB
Ketones, ur: NEGATIVE mg/dL
LEUKOCYTES UA: NEGATIVE
NITRITE: NEGATIVE
PROTEIN: 30 mg/dL — AB
Specific Gravity, Urine: 1.036 — ABNORMAL HIGH (ref 1.005–1.030)
UROBILINOGEN UA: 0.2 mg/dL (ref 0.0–1.0)
pH: 5 (ref 5.0–8.0)

## 2014-09-08 LAB — LIPASE, BLOOD: Lipase: 95 U/L — ABNORMAL HIGH (ref 11–59)

## 2014-09-08 MED ORDER — SODIUM CHLORIDE 0.9 % IV BOLUS (SEPSIS)
1000.0000 mL | Freq: Once | INTRAVENOUS | Status: AC
  Administered 2014-09-08: 1000 mL via INTRAVENOUS

## 2014-09-08 MED ORDER — FENTANYL CITRATE 0.05 MG/ML IJ SOLN
50.0000 ug | Freq: Once | INTRAMUSCULAR | Status: AC
  Administered 2014-09-08: 50 ug via INTRAVENOUS
  Filled 2014-09-08: qty 2

## 2014-09-08 MED ORDER — FENTANYL CITRATE 0.05 MG/ML IJ SOLN: 50.0000 ug | Freq: Once | INTRAMUSCULAR | Status: AC

## 2014-09-08 MED ORDER — HYDROCODONE-ACETAMINOPHEN 5-325 MG PO TABS: 2.0000 | ORAL_TABLET | Freq: Four times a day (QID) | ORAL | Status: DC | PRN

## 2014-09-08 MED ORDER — SODIUM CHLORIDE 0.9 % IV SOLN
1000.0000 mL | Freq: Once | INTRAVENOUS | Status: AC
  Administered 2014-09-08: 1000 mL via INTRAVENOUS

## 2014-09-08 MED ORDER — HYDROMORPHONE HCL 1 MG/ML IJ SOLN
1.0000 mg | Freq: Once | INTRAMUSCULAR | Status: AC
  Administered 2014-09-08: 1 mg via INTRAVENOUS
  Filled 2014-09-08: qty 1

## 2014-09-08 NOTE — ED Notes (Signed)
US at bedside

## 2014-09-08 NOTE — ED Notes (Signed)
Pt aware urine sample needed 

## 2014-09-08 NOTE — Progress Notes (Addendum)
EDCM spoke to patient at bedside. Patient noted to be seen in the ED five times within the last six months. Patient confirms he does not have a pcp or insurance living in [place].  [facility] provided patient with pamphlet to [facility], informed patient of services there and walk in times.  EDCM also provided patient with list of pcps who accept self pay patients, list of discount pharmacies and websites needymeds.org and GoodRX.com for medication assistance, phone number to inquire about the orange card, phone number to inquire about Mediciad, phone number to inquire about the [place], financial resources in the community such as local churches, salvation army, urban ministries, and dental assistance for uninsured patients.  Patient thankfulf or resources.  No further EDCM needs at this time.

## 2014-09-08 NOTE — ED Provider Notes (Signed)
CSN: [id number]     Arrival date & time 09/08/14  1615 History   First MD Initiated Contact with Patient 09/08/14 1717     Chief Complaint  Patient presents with  . Abdominal Pain      HPI  Patient presents with concern of abdominal pain, nausea, vomiting, anorexia. Symptoms began about 3 days ago. Patient was in his [facility] prior to the onset of Sx.  Since onset Sx have been worsening, with no clear alleviating or exacerbating factors. No relief e OTC meds. The pain is epigastric / RUQ, radiating posteriorly.   Past Medical History  Diagnosis Date  . Diabetes mellitus   . Hypertension   . Neuropathy of lower extremity   . Pancreatitis 06/2012   History reviewed. No pertinent past surgical history. Family History  Problem Relation Age of Onset  . Diabetes Mother   . Diabetes Maternal Aunt    History  Substance Use Topics  . Smoking status: Current Every Day Smoker -- 0.25 packs/day    Types: Cigarettes  . Smokeless tobacco: Not on file  . Alcohol Use: Yes    Review of Systems  Constitutional:       Per HPI, otherwise negative  HENT:       Per HPI, otherwise negative  Respiratory:       Per HPI, otherwise negative  Cardiovascular:       Per HPI, otherwise negative  Gastrointestinal: Positive for nausea, vomiting and abdominal pain.  Endocrine:       Negative aside from HPI  Genitourinary:       Neg aside from HPI   Musculoskeletal:       Per HPI, otherwise negative  Skin: Negative.   Neurological: Negative for syncope.      Allergies  Morphine and related  Home Medications   Prior to Admission medications   Medication Sig Start Date End Date Taking? Authorizing Provider  amLODipine (NORVASC) 10 MG tablet Take 10 mg by mouth daily.   Yes Historical Provider, MD  atorvastatin (LIPITOR) 40 MG tablet Take 1 tablet (40 mg total) by mouth daily. 02/15/14  Yes [place] N Rumley, DO  gabapentin (NEURONTIN) 300 MG capsule Take 300 mg by mouth 3 (three) times  daily.   Yes Historical Provider, MD  glipiZIDE (GLUCOTROL) 10 MG tablet Take 10 mg by mouth daily.   Yes Historical Provider, MD  lisinopril-hydrochlorothiazide (PRINZIDE,ZESTORETIC) 20-12.5 MG per tablet Take 1 tablet by mouth daily.   Yes Historical Provider, MD  metFORMIN (GLUCOPHAGE) 500 MG tablet Take 1 tablet (500 mg total) by mouth 2 (two) times daily with a meal. 07/27/14  Yes Leone Brand, MD  metoprolol (LOPRESSOR) 50 MG tablet Take 50 mg by mouth 2 (two) times daily.   Yes Historical Provider, MD  aspirin 81 MG chewable tablet Chew 1 tablet (81 mg total) by mouth daily. Patient not taking: Reported on 09/08/2014 02/15/14   Burna Cash Rumley, DO  ibuprofen (ADVIL,MOTRIN) 800 MG tablet Take 1 tablet (800 mg total) by mouth every 8 (eight) hours as needed for moderate pain. Patient not taking: Reported on 09/08/2014 05/04/14   Domenic Moras, PA-C   BP 127/82 mmHg  Pulse 83  Temp(Src) 98.1 F (36.7 C) (Oral)  Resp 14  SpO2 100% Physical Exam  Constitutional: He is oriented to person, place, and time. He appears well-developed. No distress.  HENT:  Head: Normocephalic and atraumatic.  Eyes: Conjunctivae and EOM are normal.  Cardiovascular: Normal rate and regular rhythm.  Pulmonary/Chest: Effort normal. No stridor. No respiratory distress.  Abdominal: He exhibits no distension. There is tenderness in the right upper quadrant and epigastric area. There is guarding and positive Murphy's sign. There is no rigidity and no tenderness at McBurney's point.  Musculoskeletal: He exhibits no edema.  Neurological: He is alert and oriented to person, place, and time.  Skin: Skin is warm and dry.  Psychiatric: He has a normal mood and affect.  Nursing note and vitals reviewed.   ED Course  Procedures (including critical care time) Labs Review Labs Reviewed  COMPREHENSIVE METABOLIC PANEL - Abnormal; Notable for the following:    Glucose, Bld 268 (*)    All other components within normal  limits  LIPASE, BLOOD - Abnormal; Notable for the following:    Lipase 95 (*)    All other components within normal limits  CBC WITH DIFFERENTIAL/PLATELET  URINALYSIS, ROUTINE W REFLEX MICROSCOPIC    Imaging Review US Abdomen Limited Ruq  09/08/2014   CLINICAL DATA:  Right upper quadrant abdominal pain.  EXAM: US ABDOMEN LIMITED - RIGHT UPPER QUADRANT  COMPARISON:  CT abdomen pelvis - 10/07/2013; 03/30/2013  FINDINGS: Gallbladder:  Sonographically normal. No echogenic gallstones or gall sludge. No gallbladder wall thickening or pericholecystic fluid. Negative sonographic Murphy's sign.  Common bile duct:  Diameter: Normal in size measuring 4.4 mm in diameter  Liver:  Homogeneous hepatic echotexture. No discrete hepatic lesions. No definite evidence of intrahepatic biliary ductal dilatation. No ascites.  IMPRESSION: No explanation for patient's right upper quadrant abdominal pain. Specifically, no evidence of cholelithiasis.   Electronically Signed   By: Sandi Mariscal M.D.   On: 09/08/2014 19:50     EKG Interpretation None     8:27 PM Patient in no distress. We discussed all findings, including elevated lipase. Patient has previously been advised to follow-up with gastroenterology. I reviewed the need for additional evaluation for his recurrent elevated lipase levels. Patient again states that he does not drink substantially. We discussed CT imaging for further delineation of his disease. Patient voiced a preference for outpatient follow-up, will arrange this with gastroenterology.  MDM   Final diagnoses:  RUQ abdominal pain  Pancreatitis    patient presents with ongoing upper abdominal pain. Here the patient is awake and alert, afebrile, hemodynamically stable.  Low suspicion for occult peritonitis. Patient has no chest pain or dyspnea, low suspicion for ACS. Patient's evaluation and straight elevated lipase, without audible ultrasound findings. After discussion of the need for  additional evaluation, patient was discharged to pursue this as an outpatient.     Carmin Muskrat, MD 09/08/14 2027

## 2014-09-08 NOTE — Discharge Instructions (Signed)
As discussed, it is important that you follow up as soon as possible with your physicians for continued management of your condition.  If you develop any new, or concerning changes in your condition, please return to the emergency department immediately.   Acute Pancreatitis Acute pancreatitis is a disease in which the pancreas becomes suddenly irritated (inflamed). The pancreas is a large gland behind your stomach. The pancreas makes enzymes that help digest food. The pancreas also makes 2 hormones that help control your blood sugar. Acute pancreatitis happens when the enzymes attack and damage the pancreas. Most attacks last a couple of days and can cause serious problems. HOME CARE  Follow your doctor's diet instructions. You may need to avoid alcohol and limit fat in your diet.  Eat small meals often.  Drink enough fluids to keep your pee (urine) clear or pale yellow.  Only take medicines as told by your doctor.  Avoid drinking alcohol if it caused your disease.  Do not smoke.  Get plenty of rest.  Check your blood sugar at home as told by your doctor.  Keep all doctor visits as told. GET HELP IF:  You do not get better as quickly as expected.  You have new or worsening symptoms.  You have lasting pain, weakness, or feel sick to your stomach (nauseous).  You get better and then have another pain attack. GET HELP RIGHT AWAY IF:   You are unable to eat or keep fluids down.  Your pain becomes severe.  You have a fever or lasting symptoms for more than 2 to 3 days.  You have a fever and your symptoms suddenly get worse.  Your skin or the white part of your eyes turn yellow (jaundice).  You throw up (vomit).  You feel dizzy, or you pass out (faint).  Your blood sugar is high (over 300 mg/dL). MAKE SURE YOU:   Understand these instructions.  Will watch your condition.  Will get help right away if you are not doing well or get worse. Document Released:  11/20/2007 Document Revised: 10/18/2013 Document Reviewed: 09/12/2011 [facility] Patient Information 2015 [place], [place]. This information is not intended to replace advice given to you by your health care provider. Make sure you discuss any questions you have with your health care provider.

## 2014-09-08 NOTE — ED Notes (Addendum)
Pt c/o right sided abdominal pain intermittent x 3 days, radiating to back. Pt states he had episode of emesis yesterday, no emesis today, with nausea yesterday and today. Denies diarrhea, denies ETOH. Pt had blood drawn at [facility] yesterday, [facility]BS.

## 2014-11-15 ENCOUNTER — Emergency Department (HOSPITAL_COMMUNITY)
Admission: EM | Admit: 2014-11-15 | Discharge: 2014-11-15 | Disposition: A | Discharge status: Home / Self Care | Payer: Self-pay | Attending: Emergency Medicine | Admitting: Emergency Medicine

## 2014-11-15 ENCOUNTER — Encounter (HOSPITAL_COMMUNITY): Payer: Self-pay | Admitting: Emergency Medicine

## 2014-11-15 DIAGNOSIS — G629 Polyneuropathy, unspecified: Secondary | ICD-10-CM | POA: Insufficient documentation

## 2014-11-15 DIAGNOSIS — Z8719 Personal history of other diseases of the digestive system: Secondary | ICD-10-CM | POA: Insufficient documentation

## 2014-11-15 DIAGNOSIS — M7711 Lateral epicondylitis, right elbow: Secondary | ICD-10-CM | POA: Insufficient documentation

## 2014-11-15 DIAGNOSIS — Z720 Tobacco use: Secondary | ICD-10-CM | POA: Insufficient documentation

## 2014-11-15 DIAGNOSIS — E119 Type 2 diabetes mellitus without complications: Secondary | ICD-10-CM | POA: Insufficient documentation

## 2014-11-15 DIAGNOSIS — Z79899 Other long term (current) drug therapy: Secondary | ICD-10-CM | POA: Insufficient documentation

## 2014-11-15 DIAGNOSIS — I10 Essential (primary) hypertension: Secondary | ICD-10-CM | POA: Insufficient documentation

## 2014-11-15 DIAGNOSIS — Z7982 Long term (current) use of aspirin: Secondary | ICD-10-CM | POA: Insufficient documentation

## 2014-11-15 DIAGNOSIS — M7712 Lateral epicondylitis, left elbow: Secondary | ICD-10-CM | POA: Insufficient documentation

## 2014-11-15 MED ORDER — KETOROLAC TROMETHAMINE 60 MG/2ML IM SOLN
60.0000 mg | Freq: Once | INTRAMUSCULAR | Status: AC
  Administered 2014-11-15: 60 mg via INTRAMUSCULAR
  Filled 2014-11-15: qty 2

## 2014-11-15 NOTE — ED Provider Notes (Signed)
CSN: [id number]     Arrival date & time 11/15/14  1216 History  This chart was scribed for a non-physician practitioner, Al Corpus, PA-C working with Elnora Morrison, MD by Martinique Peace, ED Scribe. The patient was seen in [facility]. The patient's care was started at 1:50 PM.    Chief Complaint  Patient presents with  . Elbow Pain      The history is provided by the patient. No language interpreter was used.    HPI Comments: Christopher Davila is a 44 y.o. male who presents to the Emergency Department complaining of progressively worsening bilateral elbow pain x 2.5 weeks. Pain exacerbated with holding objects for along period of time. Pt denies any recent traumas to bilateral elbows that could be responsible for current pain. 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 He notes he has tried taking Naproxen for symptoms with minimal relief. No complaints of fever, chills, numbness, or tingling. He further denies any swelling or redness to affected areas. Pt is right hand-dominant. History of DM and Hypertension.   Past Medical History  Diagnosis Date  . Diabetes mellitus   . Hypertension   . Neuropathy of lower extremity   . Pancreatitis 06/2012   History reviewed. No pertinent past surgical history. Family History  Problem Relation Age of Onset  . Diabetes Mother   . Diabetes Maternal Aunt    History  Substance Use Topics  . Smoking status: Current Every Day Smoker -- 0.25 packs/day    Types: Cigarettes  . Smokeless tobacco: Not on file  . Alcohol Use: Yes    Review of Systems  Constitutional: Negative for fever and chills.  Skin: Negative for color change and wound.  Neurological: Negative for weakness and numbness.      Allergies  Morphine and related  Home Medications   Prior to Admission medications   Medication Sig Start Date End Date Taking? Authorizing Provider   amLODipine (NORVASC) 10 MG tablet Take 10 mg by mouth daily.    Historical Provider, MD  aspirin 81 MG chewable tablet Chew 1 tablet (81 mg total) by mouth daily. Patient not taking: Reported on 09/08/2014 02/15/14   Burna Cash Rumley, DO  atorvastatin (LIPITOR) 40 MG tablet Take 1 tablet (40 mg total) by mouth daily. 02/15/14   Towner N Rumley, DO  gabapentin (NEURONTIN) 300 MG capsule Take 300 mg by mouth 3 (three) times daily.    Historical Provider, MD  glipiZIDE (GLUCOTROL) 10 MG tablet Take 10 mg by mouth daily.    Historical Provider, MD  HYDROcodone-acetaminophen (NORCO/VICODIN) 5-325 MG per tablet Take 2 tablets by mouth every 6 (six) hours as needed. 09/08/14   Carmin Muskrat, MD  ibuprofen (ADVIL,MOTRIN) 800 MG tablet Take 1 tablet (800 mg total) by mouth every 8 (eight) hours as needed for moderate pain. Patient not taking: Reported on 09/08/2014 05/04/14   Domenic Moras, PA-C  lisinopril-hydrochlorothiazide (PRINZIDE,ZESTORETIC) 20-12.5 MG per tablet Take 1 tablet by mouth daily.    Historical Provider, MD  metFORMIN (GLUCOPHAGE) 500 MG tablet Take 1 tablet (500 mg total) by mouth 2 (two) times daily with a meal. 07/27/14   Leone Brand, MD  metoprolol (LOPRESSOR) 50 MG tablet Take 50 mg by mouth 2 (two) times daily.    Historical Provider, MD   BP 132/93 mmHg  Pulse 85  Temp(Src) 99.1 F (37.3 C) (Oral)  Resp 17  SpO2 100% Physical Exam  Constitutional: He appears well-developed and well-nourished. No distress.  HENT:  Head: Normocephalic and atraumatic.  Eyes: Conjunctivae are normal. Right eye exhibits no discharge. Left eye exhibits no discharge.  Cardiovascular:  2+ radial pulses equal bilaterally.  Pulmonary/Chest: Effort normal. No respiratory distress.  Musculoskeletal: He exhibits tenderness. He exhibits no edema.  Tenderness over right and left epicondyl without overlying skin changes or erythema.  5/5 strength in bilateral UE's. Sensation intact.  Pain worse with wrist  extension bilaterally.   Neurological: He is alert. Coordination normal.  Skin: Skin is warm and dry. No rash noted. He is not diaphoretic. No erythema.  Psychiatric: He has a normal mood and affect. His behavior is normal.  Nursing note and vitals reviewed.   ED Course  Procedures (including critical care time) Labs Review Labs Reviewed - No data to display  Imaging Review No results found.   EKG Interpretation None     Medications  ketorolac (TORADOL) injection 60 mg (60 mg Intramuscular Given 11/15/14 1359)    1:52 PM- Treatment plan was discussed with patient who verbalizes understanding and agrees.   MDM   Final diagnoses:  Epicondylitis, lateral, right  Epicondylitis, lateral, left   Patient presenting with epicondylitis bilaterally worse with repetitive motions. VSS. Neurovascularly intact. Treat with NSAIDs and follow-up with orthopedics for possible cortisone injections.  Discussed return precautions with patient. Discussed all results and patient verbalizes understanding and agrees with plan.  I personally performed the services described in this documentation, which was scribed in my presence. The recorded information has been reviewed and is accurate.   Al Corpus, PA-C 11/15/14 1440  Elnora Morrison, MD 11/15/14 [phone]

## 2014-11-15 NOTE — Discharge Instructions (Signed)
Return to the emergency room with worsening of symptoms, new symptoms or with symptoms that are concerning, especially redness, swelling, numbness, tingling, weakness, fevers. RICE: Rest, Ice (three cycles of 20 mins on, 3mins off at least twice a day), compression/brace, elevation. Heating pad works well for back pain. Ibuprofen 400mg  (2 tablets 200mg ) every 5-6 hours for 3-5 days. Follow up with PCP/orthopedist if symptoms worsen or are persistent. Read below information and follow recommendations.  Lateral Epicondylitis (Tennis Elbow) with Rehab Lateral epicondylitis involves inflammation and pain around the outer portion of the elbow. The pain is caused by inflammation of the tendons in the forearm that bring back (extend) the wrist. Lateral epicondylitis is also called tennis elbow, because it is very common in tennis players. However, it may occur in any individual who extends the wrist repetitively. If lateral epicondylitis is left untreated, it may become a chronic problem. SYMPTOMS   Pain, tenderness, and inflammation on the outer (lateral) side of the elbow.  Pain or weakness with gripping activities.  Pain that increases with wrist-twisting motions (playing tennis, using a screwdriver, opening a door or a jar).  Pain with lifting objects, including a coffee cup. CAUSES  Lateral epicondylitis is caused by inflammation of the tendons that extend the wrist. Causes of injury may include:  Repetitive stress and strain on the muscles and tendons that extend the wrist.  Sudden change in activity level or intensity.  Incorrect grip in racquet sports.  Incorrect grip size of racquet (often too large).  Incorrect hitting position or technique (usually backhand, leading with the elbow).  Using a racket that is too heavy. RISK INCREASES WITH:  Sports or occupations that require repetitive and/or strenuous forearm and wrist movements (tennis, squash, racquetball, carpentry).  Poor  wrist and forearm strength and flexibility.  Failure to warm up properly before activity.  Resuming activity before healing, rehabilitation, and conditioning are complete. PREVENTION   Warm up and stretch properly before activity.  Maintain physical fitness:  Strength, flexibility, and endurance.  Cardiovascular fitness.  Wear and use properly fitted equipment.  Learn and use proper technique and have a coach correct improper technique.  Wear a tennis elbow (counterforce) brace. PROGNOSIS  The course of this condition depends on the degree of the injury. If treated properly, acute cases (symptoms lasting less than 4 weeks) are often resolved in 2 to 6 weeks. Chronic (longer lasting cases) often resolve in 3 to 6 months but may require physical therapy. RELATED COMPLICATIONS   Frequently recurring symptoms, resulting in a chronic problem. Properly treating the problem the first time decreases frequency of recurrence.  Chronic inflammation, scarring tendon degeneration, and partial tendon tear, requiring surgery.  Delayed healing or resolution of symptoms. TREATMENT  Treatment first involves the use of ice and medicine to reduce pain and inflammation. Strengthening and stretching exercises may help reduce discomfort if performed regularly. These exercises may be performed at home if the condition is an acute injury. Chronic cases may require a referral to a physical therapist for evaluation and treatment. Your caregiver may advise a corticosteroid injection to help reduce inflammation. Rarely, surgery is needed. MEDICATION  If pain medicine is needed, nonsteroidal anti-inflammatory medicines (aspirin and ibuprofen), or other minor pain relievers (acetaminophen), are often advised.  Do not take pain medicine for 7 days before surgery.  Prescription pain relievers may be given, if your caregiver thinks they are needed. Use only as directed and only as much as you  need.  Corticosteroid injections may be  recommended. These injections should be reserved only for the most severe cases, because they can only be given a certain number of times. HEAT AND COLD  Cold treatment (icing) should be applied for 10 to 15 minutes every 2 to 3 hours for inflammation and pain, and immediately after activity that aggravates your symptoms. Use ice packs or an ice massage.  Heat treatment may be used before performing stretching and strengthening activities prescribed by your caregiver, physical therapist, or athletic trainer. Use a heat pack or a warm water soak. SEEK MEDICAL CARE IF: Symptoms get worse or do not improve in 2 weeks, despite treatment. EXERCISES  RANGE OF MOTION (ROM) AND STRETCHING EXERCISES - Epicondylitis, Lateral (Tennis Elbow) These exercises may help you when beginning to rehabilitate your injury. Your symptoms may go away with or without further involvement from your physician, physical therapist, or athletic trainer. While completing these exercises, remember:   Restoring tissue flexibility helps normal motion to return to the joints. This allows healthier, less painful movement and activity.  An effective stretch should be held for at least 30 seconds.  A stretch should never be painful. You should only feel a gentle lengthening or release in the stretched tissue. RANGE OF MOTION - Wrist Flexion, Active-Assisted  Extend your right / left elbow with your fingers pointing down.*  Gently pull the back of your hand towards you, until you feel a gentle stretch on the top of your forearm.  Hold this position for __________ seconds. Repeat __________ times. Complete this exercise __________ times per day.  *If directed by your physician, physical therapist or athletic trainer, complete this stretch with your elbow bent, rather than extended. RANGE OF MOTION - Wrist Extension, Active-Assisted  Extend your right / left elbow and turn your palm  upwards.*  Gently pull your palm and fingertips back, so your wrist extends and your fingers point more toward the ground.  You should feel a gentle stretch on the inside of your forearm.  Hold this position for __________ seconds. Repeat __________ times. Complete this exercise __________ times per day. *If directed by your physician, physical therapist or athletic trainer, complete this stretch with your elbow bent, rather than extended. STRETCH - Wrist Flexion  Place the back of your right / left hand on a tabletop, leaving your elbow slightly bent. Your fingers should point away from your body.  Gently press the back of your hand down onto the table by straightening your elbow. You should feel a stretch on the top of your forearm.  Hold this position for __________ seconds. Repeat __________ times. Complete this stretch __________ times per day.  STRETCH - Wrist Extension   Place your right / left fingertips on a tabletop, leaving your elbow slightly bent. Your fingers should point backwards.  Gently press your fingers and palm down onto the table by straightening your elbow. You should feel a stretch on the inside of your forearm.  Hold this position for __________ seconds. Repeat __________ times. Complete this stretch __________ times per day.  STRENGTHENING EXERCISES - Epicondylitis, Lateral (Tennis Elbow) These exercises may help you when beginning to rehabilitate your injury. They may resolve your symptoms with or without further involvement from your physician, physical therapist, or athletic trainer. While completing these exercises, remember:   Muscles can gain both the endurance and the strength needed for everyday activities through controlled exercises.  Complete these exercises as instructed by your physician, physical therapist or athletic trainer. Increase the resistance and repetitions  only as guided.  You may experience muscle soreness or fatigue, but the pain or  discomfort you are trying to eliminate should never worsen during these exercises. If this pain does get worse, stop and make sure you are following the directions exactly. If the pain is still present after adjustments, discontinue the exercise until you can discuss the trouble with your caregiver. STRENGTH - Wrist Flexors  Sit with your right / left forearm palm-up and fully supported on a table or countertop. Your elbow should be resting below the height of your shoulder. Allow your wrist to extend over the edge of the surface.  Loosely holding a __________ weight, or a piece of rubber exercise band or tubing, slowly curl your hand up toward your forearm.  Hold this position for __________ seconds. Slowly lower the wrist back to the starting position in a controlled manner. Repeat __________ times. Complete this exercise __________ times per day.  STRENGTH - Wrist Extensors  Sit with your right / left forearm palm-down and fully supported on a table or countertop. Your elbow should be resting below the height of your shoulder. Allow your wrist to extend over the edge of the surface.  Loosely holding a __________ weight, or a piece of rubber exercise band or tubing, slowly curl your hand up toward your forearm.  Hold this position for __________ seconds. Slowly lower the wrist back to the starting position in a controlled manner. Repeat __________ times. Complete this exercise __________ times per day.  STRENGTH - Ulnar Deviators  Stand with a ____________________ weight in your right / left hand, or sit while holding a rubber exercise band or tubing, with your healthy arm supported on a table or countertop.  Move your wrist, so that your pinkie travels toward your forearm and your thumb moves away from your forearm.  Hold this position for __________ seconds and then slowly lower the wrist back to the starting position. Repeat __________ times. Complete this exercise __________ times per  day STRENGTH - Radial Deviators  Stand with a ____________________ weight in your right / left hand, or sit while holding a rubber exercise band or tubing, with your injured arm supported on a table or countertop.  Raise your hand upward in front of you or pull up on the rubber tubing.  Hold this position for __________ seconds and then slowly lower the wrist back to the starting position. Repeat __________ times. Complete this exercise __________ times per day. STRENGTH - Forearm Supinators   Sit with your right / left forearm supported on a table, keeping your elbow below shoulder height. Rest your hand over the edge, palm down.  Gently grip a hammer or a soup ladle.  Without moving your elbow, slowly turn your palm and hand upward to a "thumbs-up" position.  Hold this position for __________ seconds. Slowly return to the starting position. Repeat __________ times. Complete this exercise __________ times per day.  STRENGTH - Forearm Pronators   Sit with your right / left forearm supported on a table, keeping your elbow below shoulder height. Rest your hand over the edge, palm up.  Gently grip a hammer or a soup ladle.  Without moving your elbow, slowly turn your palm and hand upward to a "thumbs-up" position.  Hold this position for __________ seconds. Slowly return to the starting position. Repeat __________ times. Complete this exercise __________ times per day.  STRENGTH - Grip  Grasp a tennis ball, a dense sponge, or a large, rolled sock in  your hand.  Squeeze as hard as you can, without increasing any pain.  Hold this position for __________ seconds. Release your grip slowly. Repeat __________ times. Complete this exercise __________ times per day.  STRENGTH - Elbow Extensors, Isometric  Stand or sit upright, on a firm surface. Place your right / left arm so that your palm faces your stomach, and it is at the height of your waist.  Place your opposite hand on the  underside of your forearm. Gently push up as your right / left arm resists. Push as hard as you can with both arms, without causing any pain or movement at your right / left elbow. Hold this stationary position for __________ seconds. Gradually release the tension in both arms. Allow your muscles to relax completely before repeating. Document Released: 06/03/2005 Document Revised: 10/18/2013 Document Reviewed: 09/15/2008 [facility] Patient Information 2015 [place], [place]. This information is not intended to replace advice given to you by your health care provider. Make sure you discuss any questions you have with your health care provider.

## 2014-11-15 NOTE — ED Notes (Signed)
Pt c/o bilat elbow pain for couple weeks. Pt states that if he holds something for period of time he can't bend his elbows.

## 2014-12-13 ENCOUNTER — Emergency Department (HOSPITAL_COMMUNITY)
Admission: EM | Admit: 2014-12-13 | Discharge: 2014-12-13 | Disposition: A | Discharge status: Home / Self Care | Payer: Self-pay | Attending: Emergency Medicine | Admitting: Emergency Medicine

## 2014-12-13 ENCOUNTER — Encounter (HOSPITAL_COMMUNITY): Payer: Self-pay | Admitting: Emergency Medicine

## 2014-12-13 DIAGNOSIS — E119 Type 2 diabetes mellitus without complications: Secondary | ICD-10-CM | POA: Insufficient documentation

## 2014-12-13 DIAGNOSIS — I10 Essential (primary) hypertension: Secondary | ICD-10-CM | POA: Insufficient documentation

## 2014-12-13 DIAGNOSIS — Z79899 Other long term (current) drug therapy: Secondary | ICD-10-CM | POA: Insufficient documentation

## 2014-12-13 DIAGNOSIS — Z7982 Long term (current) use of aspirin: Secondary | ICD-10-CM | POA: Insufficient documentation

## 2014-12-13 DIAGNOSIS — K2970 Gastritis, unspecified, without bleeding: Secondary | ICD-10-CM | POA: Insufficient documentation

## 2014-12-13 DIAGNOSIS — G629 Polyneuropathy, unspecified: Secondary | ICD-10-CM | POA: Insufficient documentation

## 2014-12-13 DIAGNOSIS — Z720 Tobacco use: Secondary | ICD-10-CM | POA: Insufficient documentation

## 2014-12-13 LAB — COMPREHENSIVE METABOLIC PANEL
ALT: 28 U/L (ref 17–63)
AST: 27 U/L (ref 15–41)
Albumin: 4.3 g/dL (ref 3.5–5.0)
Alkaline Phosphatase: 78 U/L (ref 38–126)
Anion gap: 9 (ref 5–15)
BILIRUBIN TOTAL: 0.7 mg/dL (ref 0.3–1.2)
BUN: 15 mg/dL (ref 6–20)
CALCIUM: 9.5 mg/dL (ref 8.9–10.3)
CHLORIDE: 99 mmol/L — AB (ref 101–111)
CO2: 29 mmol/L (ref 22–32)
CREATININE: 0.95 mg/dL (ref 0.61–1.24)
GFR calc Af Amer: >60 mL/min (ref >60)
GFR calc non Af Amer: >60 mL/min (ref >60)
Glucose, Bld: 115 mg/dL — ABNORMAL HIGH (ref 65–99)
Potassium: 3.7 mmol/L (ref 3.5–5.1)
SODIUM: 137 mmol/L (ref 135–145)
Total Protein: 8.3 g/dL — ABNORMAL HIGH (ref 6.5–8.1)

## 2014-12-13 LAB — CBC WITH DIFFERENTIAL/PLATELET
BASOS ABS: 0 10*3/uL (ref 0.0–0.1)
BASOS PCT: 0 % (ref 0–1)
Eosinophils Absolute: 0.2 10*3/uL (ref 0.0–0.7)
Eosinophils Relative: 2 % (ref 0–5)
HCT: 40.9 % (ref 39.0–52.0)
Hemoglobin: 13.6 g/dL (ref 13.0–17.0)
LYMPHS PCT: 34 % (ref 12–46)
Lymphs Abs: 3.3 10*3/uL (ref 0.7–4.0)
MCH: 29.1 pg (ref 26.0–34.0)
MCHC: 33.3 g/dL (ref 30.0–36.0)
MCV: 87.4 fL (ref 78.0–100.0)
Monocytes Absolute: 0.8 10*3/uL (ref 0.1–1.0)
Monocytes Relative: 8 % (ref 3–12)
Neutro Abs: 5.5 10*3/uL (ref 1.7–7.7)
Neutrophils Relative %: 56 % (ref 43–77)
PLATELETS: 195 10*3/uL (ref 150–400)
RBC: 4.68 MIL/uL (ref 4.22–5.81)
RDW: 13.1 % (ref 11.5–15.5)
WBC: 9.8 10*3/uL (ref 4.0–10.5)

## 2014-12-13 LAB — URINALYSIS, ROUTINE W REFLEX MICROSCOPIC
Bilirubin Urine: NEGATIVE
GLUCOSE, UA: NEGATIVE mg/dL
KETONES UR: 15 mg/dL — AB
Leukocytes, UA: NEGATIVE
Nitrite: NEGATIVE
Protein, ur: 100 mg/dL — AB
Specific Gravity, Urine: 1.022 (ref 1.005–1.030)
Urobilinogen, UA: 0.2 mg/dL (ref 0.0–1.0)
pH: 5 (ref 5.0–8.0)

## 2014-12-13 LAB — URINE MICROSCOPIC-ADD ON

## 2014-12-13 LAB — LIPASE, BLOOD: Lipase: 26 U/L (ref 22–51)

## 2014-12-13 MED ORDER — GI COCKTAIL ~~LOC~~
30.0000 mL | Freq: Once | ORAL | Status: AC
  Administered 2014-12-13: 30 mL via ORAL
  Filled 2014-12-13: qty 30

## 2014-12-13 MED ORDER — FAMOTIDINE 20 MG PO TABS
40.0000 mg | ORAL_TABLET | Freq: Once | ORAL | Status: AC
  Administered 2014-12-13: 40 mg via ORAL
  Filled 2014-12-13: qty 2

## 2014-12-13 MED ORDER — SUCRALFATE 1 G PO TABS: 1.0000 g | ORAL_TABLET | Freq: Four times a day (QID) | ORAL | Status: DC

## 2014-12-13 MED ORDER — SUCRALFATE 1 G PO TABS
1.0000 g | ORAL_TABLET | Freq: Once | ORAL | Status: AC
  Administered 2014-12-13: 1 g via ORAL
  Filled 2014-12-13: qty 1

## 2014-12-13 MED ORDER — FAMOTIDINE 20 MG PO TABS: 20.0000 mg | ORAL_TABLET | Freq: Two times a day (BID) | ORAL | Status: DC

## 2014-12-13 NOTE — ED Notes (Signed)
Patient supplied with urinal.  Sample requested

## 2014-12-13 NOTE — ED Notes (Signed)
Per patient states right upper quadrant pain for a couple of days and nausea

## 2014-12-13 NOTE — ED Provider Notes (Signed)
CSN: [id number]     Arrival date & time 12/13/14  1648 History   First MD Initiated Contact with Patient 12/13/14 2018     Chief Complaint  Patient presents with  . Abdominal Pain     (Consider location/radiation/quality/duration/timing/severity/associated sxs/prior Treatment) HPI Comments: Patient here complaining of right upper quadrant pain that began today after he drank heavily. Pain characterized as burning and worse when he eats. He had associated nausea but no vomiting. No fever or chills. Pain does not go to his back. Does have a history of pancreatitis. Denies any black or bloody stools. No treatment used prior to arrival  Patient is a 44 y.o. male presenting with abdominal pain. The history is provided by the patient.  Abdominal Pain   Past Medical History  Diagnosis Date  . Diabetes mellitus   . Hypertension   . Neuropathy of lower extremity   . Pancreatitis 06/2012   History reviewed. No pertinent past surgical history. Family History  Problem Relation Age of Onset  . Diabetes Mother   . Diabetes Maternal Aunt    History  Substance Use Topics  . Smoking status: Current Every Day Smoker -- 0.25 packs/day    Types: Cigarettes  . Smokeless tobacco: Not on file  . Alcohol Use: Yes    Review of Systems  Gastrointestinal: Positive for abdominal pain.  All other systems reviewed and are negative.     Allergies  Morphine and related  Home Medications   Prior to Admission medications   Medication Sig Start Date End Date Taking? Authorizing Provider  amLODipine (NORVASC) 10 MG tablet Take 10 mg by mouth daily.    Historical Provider, MD  aspirin 81 MG chewable tablet Chew 1 tablet (81 mg total) by mouth daily. Patient not taking: Reported on 09/08/2014 02/15/14   Burna Cash Rumley, DO  atorvastatin (LIPITOR) 40 MG tablet Take 1 tablet (40 mg total) by mouth daily. 02/15/14   [place] N Rumley, DO  gabapentin (NEURONTIN) 300 MG capsule Take 300 mg by mouth 3 (three)  times daily.    Historical Provider, MD  glipiZIDE (GLUCOTROL) 10 MG tablet Take 10 mg by mouth daily.    Historical Provider, MD  HYDROcodone-acetaminophen (NORCO/VICODIN) 5-325 MG per tablet Take 2 tablets by mouth every 6 (six) hours as needed. 09/08/14   Carmin Muskrat, MD  ibuprofen (ADVIL,MOTRIN) 800 MG tablet Take 1 tablet (800 mg total) by mouth every 8 (eight) hours as needed for moderate pain. Patient not taking: Reported on 09/08/2014 05/04/14   Domenic Moras, PA-C  lisinopril-hydrochlorothiazide (PRINZIDE,ZESTORETIC) 20-12.5 MG per tablet Take 1 tablet by mouth daily.    Historical Provider, MD  metFORMIN (GLUCOPHAGE) 500 MG tablet Take 1 tablet (500 mg total) by mouth 2 (two) times daily with a meal. 07/27/14   Leone Brand, MD  metoprolol (LOPRESSOR) 50 MG tablet Take 50 mg by mouth 2 (two) times daily.    Historical Provider, MD   BP 125/77 mmHg  Pulse 79  Temp(Src) 98.6 F (37 C) (Oral)  Resp 16  Wt 150 lb (68.04 kg)  SpO2 100% Physical Exam  Constitutional: He is oriented to person, place, and time. He appears well-developed and well-nourished.  Non-toxic appearance. No distress.  HENT:  Head: Normocephalic and atraumatic.  Eyes: Conjunctivae, EOM and lids are normal. Pupils are equal, round, and reactive to light.  Neck: Normal range of motion. Neck supple. No tracheal deviation present. No thyroid mass present.  Cardiovascular: Normal rate, regular rhythm and normal  heart sounds.  Exam reveals no gallop.   No murmur heard. Pulmonary/Chest: Effort normal and breath sounds normal. No stridor. No respiratory distress. He has no decreased breath sounds. He has no wheezes. He has no rhonchi. He has no rales.  Abdominal: Soft. Normal appearance and bowel sounds are normal. He exhibits no distension. There is tenderness in the right upper quadrant. There is no rebound and no CVA tenderness.    Musculoskeletal: Normal range of motion. He exhibits no edema or tenderness.    Neurological: He is alert and oriented to person, place, and time. He has normal strength. No cranial nerve deficit or sensory deficit. GCS eye subscore is 4. GCS verbal subscore is 5. GCS motor subscore is 6.  Skin: Skin is warm and dry. No abrasion and no rash noted.  Psychiatric: He has a normal mood and affect. His speech is normal and behavior is normal.  Nursing note and vitals reviewed.   ED Course  Procedures (including critical care time) Labs Review Labs Reviewed  COMPREHENSIVE METABOLIC PANEL - Abnormal; Notable for the following:    Chloride 99 (*)    Glucose, Bld 115 (*)    Total Protein 8.3 (*)    All other components within normal limits  CBC WITH DIFFERENTIAL/PLATELET  URINALYSIS, ROUTINE W REFLEX MICROSCOPIC (NOT AT [facility])    Imaging Review No results found.   EKG Interpretation None      MDM   Final diagnoses:  None    Patient given medications for alcoholic gastritis.. Will be discharged home    Lacretia Leigh, MD 12/13/14 2236

## 2014-12-13 NOTE — Discharge Instructions (Signed)

## 2015-05-23 ENCOUNTER — Emergency Department (HOSPITAL_COMMUNITY): Payer: Self-pay

## 2015-05-23 ENCOUNTER — Encounter (HOSPITAL_COMMUNITY): Payer: Self-pay | Admitting: Adult Health

## 2015-05-23 ENCOUNTER — Observation Stay (HOSPITAL_COMMUNITY)
Admission: EM | Admit: 2015-05-23 | Discharge: 2015-05-24 | Disposition: A | Discharge status: Home / Self Care | Payer: Self-pay | Attending: Internal Medicine | Admitting: Internal Medicine

## 2015-05-23 DIAGNOSIS — Z79899 Other long term (current) drug therapy: Secondary | ICD-10-CM | POA: Insufficient documentation

## 2015-05-23 DIAGNOSIS — Z8719 Personal history of other diseases of the digestive system: Secondary | ICD-10-CM | POA: Insufficient documentation

## 2015-05-23 DIAGNOSIS — E11649 Type 2 diabetes mellitus with hypoglycemia without coma: Secondary | ICD-10-CM

## 2015-05-23 DIAGNOSIS — R0600 Dyspnea, unspecified: Secondary | ICD-10-CM | POA: Insufficient documentation

## 2015-05-23 DIAGNOSIS — R61 Generalized hyperhidrosis: Secondary | ICD-10-CM

## 2015-05-23 DIAGNOSIS — R42 Dizziness and giddiness: Secondary | ICD-10-CM

## 2015-05-23 DIAGNOSIS — G5790 Unspecified mononeuropathy of unspecified lower limb: Secondary | ICD-10-CM | POA: Insufficient documentation

## 2015-05-23 DIAGNOSIS — F17210 Nicotine dependence, cigarettes, uncomplicated: Secondary | ICD-10-CM | POA: Insufficient documentation

## 2015-05-23 DIAGNOSIS — R202 Paresthesia of skin: Secondary | ICD-10-CM | POA: Insufficient documentation

## 2015-05-23 DIAGNOSIS — D849 Immunodeficiency, unspecified: Secondary | ICD-10-CM | POA: Insufficient documentation

## 2015-05-23 DIAGNOSIS — R0602 Shortness of breath: Secondary | ICD-10-CM

## 2015-05-23 DIAGNOSIS — E1165 Type 2 diabetes mellitus with hyperglycemia: Secondary | ICD-10-CM

## 2015-05-23 DIAGNOSIS — E119 Type 2 diabetes mellitus without complications: Secondary | ICD-10-CM

## 2015-05-23 DIAGNOSIS — R110 Nausea: Secondary | ICD-10-CM

## 2015-05-23 DIAGNOSIS — R1013 Epigastric pain: Secondary | ICD-10-CM | POA: Insufficient documentation

## 2015-05-23 DIAGNOSIS — E785 Hyperlipidemia, unspecified: Secondary | ICD-10-CM | POA: Insufficient documentation

## 2015-05-23 DIAGNOSIS — I10 Essential (primary) hypertension: Secondary | ICD-10-CM

## 2015-05-23 DIAGNOSIS — E1142 Type 2 diabetes mellitus with diabetic polyneuropathy: Secondary | ICD-10-CM

## 2015-05-23 DIAGNOSIS — R079 Chest pain, unspecified: Principal | ICD-10-CM

## 2015-05-23 LAB — CBC
HEMATOCRIT: 40 % (ref 39.0–52.0)
Hemoglobin: 13.2 g/dL (ref 13.0–17.0)
MCH: 28.9 pg (ref 26.0–34.0)
MCHC: 33 g/dL (ref 30.0–36.0)
MCV: 87.5 fL (ref 78.0–100.0)
Platelets: 215 10*3/uL (ref 150–400)
RBC: 4.57 MIL/uL (ref 4.22–5.81)
RDW: 12.7 % (ref 11.5–15.5)
WBC: 8.6 10*3/uL (ref 4.0–10.5)

## 2015-05-23 LAB — BASIC METABOLIC PANEL
ANION GAP: 12 (ref 5–15)
BUN: 16 mg/dL (ref 6–20)
CO2: 23 mmol/L (ref 22–32)
Calcium: 9.8 mg/dL (ref 8.9–10.3)
Chloride: 96 mmol/L — ABNORMAL LOW (ref 101–111)
Creatinine, Ser: 1.12 mg/dL (ref 0.61–1.24)
GFR calc Af Amer: >60 mL/min (ref >60)
GFR calc non Af Amer: >60 mL/min (ref >60)
GLUCOSE: 243 mg/dL — AB (ref 65–99)
POTASSIUM: 3.8 mmol/L (ref 3.5–5.1)
Sodium: 131 mmol/L — ABNORMAL LOW (ref 135–145)

## 2015-05-23 LAB — TROPONIN I: Troponin I: <0.03 ng/mL (ref <0.031)

## 2015-05-23 LAB — I-STAT TROPONIN, ED: Troponin i, poc: 0 ng/mL (ref 0.00–0.08)

## 2015-05-23 LAB — GLUCOSE, CAPILLARY: Glucose-Capillary: 176 mg/dL — ABNORMAL HIGH (ref 65–99)

## 2015-05-23 MED ORDER — ONDANSETRON HCL 4 MG/2ML IJ SOLN
4.0000 mg | Freq: Four times a day (QID) | INTRAMUSCULAR | Status: DC | PRN
Start: 2015-05-23 — End: 2015-05-24

## 2015-05-23 MED ORDER — NITROGLYCERIN 0.4 MG SL SUBL
0.4000 mg | SUBLINGUAL_TABLET | SUBLINGUAL | Status: DC | PRN
  Administered 2015-05-23: 0.4 mg via SUBLINGUAL

## 2015-05-23 MED ORDER — HYDROCHLOROTHIAZIDE 12.5 MG PO CAPS
12.5000 mg | ORAL_CAPSULE | Freq: Every day | ORAL | Status: DC
  Administered 2015-05-24: 12.5 mg via ORAL
  Filled 2015-05-23: qty 1

## 2015-05-23 MED ORDER — FENTANYL CITRATE (PF) 100 MCG/2ML IJ SOLN
50.0000 ug | Freq: Once | INTRAMUSCULAR | Status: AC
  Administered 2015-05-23: 50 ug via INTRAVENOUS
  Filled 2015-05-23: qty 2

## 2015-05-23 MED ORDER — ASPIRIN 325 MG PO TABS
325.0000 mg | ORAL_TABLET | Freq: Once | ORAL | Status: AC
  Administered 2015-05-23: 325 mg via ORAL
  Filled 2015-05-23: qty 1

## 2015-05-23 MED ORDER — GABAPENTIN 400 MG PO CAPS
800.0000 mg | ORAL_CAPSULE | Freq: Three times a day (TID) | ORAL | Status: DC
  Administered 2015-05-23 – 2015-05-24 (×2): 800 mg via ORAL
  Filled 2015-05-23 (×2): qty 2

## 2015-05-23 MED ORDER — ASPIRIN EC 81 MG PO TBEC
81.0000 mg | DELAYED_RELEASE_TABLET | Freq: Every day | ORAL | Status: DC
  Administered 2015-05-24: 81 mg via ORAL
  Filled 2015-05-23: qty 1

## 2015-05-23 MED ORDER — ENOXAPARIN SODIUM 40 MG/0.4ML ~~LOC~~ SOLN: 40.0000 mg | SUBCUTANEOUS | Status: DC

## 2015-05-23 MED ORDER — SODIUM CHLORIDE 0.9 % IV SOLN: INTRAVENOUS | Status: DC

## 2015-05-23 MED ORDER — LISINOPRIL 20 MG PO TABS
20.0000 mg | ORAL_TABLET | Freq: Every day | ORAL | Status: DC
  Administered 2015-05-24: 20 mg via ORAL
  Filled 2015-05-23: qty 1

## 2015-05-23 MED ORDER — NITROGLYCERIN 0.4 MG SL SUBL: 0.4000 mg | SUBLINGUAL_TABLET | SUBLINGUAL | Status: DC | PRN

## 2015-05-23 MED ORDER — LISINOPRIL-HYDROCHLOROTHIAZIDE 20-12.5 MG PO TABS: 1.0000 | ORAL_TABLET | Freq: Every day | ORAL | Status: DC

## 2015-05-23 MED ORDER — ATORVASTATIN CALCIUM 40 MG PO TABS: 40.0000 mg | ORAL_TABLET | Freq: Every day | ORAL | Status: DC

## 2015-05-23 MED ORDER — INSULIN ASPART 100 UNIT/ML ~~LOC~~ SOLN
0.0000 [IU] | Freq: Three times a day (TID) | SUBCUTANEOUS | Status: DC
  Administered 2015-05-24: 5 [IU] via SUBCUTANEOUS
  Administered 2015-05-24: 3 [IU] via SUBCUTANEOUS

## 2015-05-23 MED ORDER — GABAPENTIN 800 MG PO TABS
800.0000 mg | ORAL_TABLET | Freq: Three times a day (TID) | ORAL | Status: DC
  Filled 2015-05-23: qty 1

## 2015-05-23 MED ORDER — ACETAMINOPHEN 325 MG PO TABS
650.0000 mg | ORAL_TABLET | ORAL | Status: DC | PRN
  Administered 2015-05-23: 650 mg via ORAL
  Filled 2015-05-23: qty 2

## 2015-05-23 MED ORDER — INSULIN ASPART 100 UNIT/ML ~~LOC~~ SOLN: 0.0000 [IU] | Freq: Every day | SUBCUTANEOUS | Status: DC

## 2015-05-23 MED ORDER — NITROGLYCERIN 0.4 MG SL SUBL
SUBLINGUAL_TABLET | SUBLINGUAL | Status: AC
  Administered 2015-05-23: 0.4 mg via SUBLINGUAL
  Filled 2015-05-23: qty 1

## 2015-05-23 MED ORDER — GI COCKTAIL ~~LOC~~: 30.0000 mL | Freq: Four times a day (QID) | ORAL | Status: DC | PRN

## 2015-05-23 MED ORDER — AMLODIPINE BESYLATE 10 MG PO TABS
10.0000 mg | ORAL_TABLET | Freq: Every day | ORAL | Status: DC
  Administered 2015-05-24: 10 mg via ORAL
  Filled 2015-05-23: qty 1

## 2015-05-23 MED ORDER — GI COCKTAIL ~~LOC~~
30.0000 mL | Freq: Once | ORAL | Status: AC
  Administered 2015-05-23: 30 mL via ORAL
  Filled 2015-05-23: qty 30

## 2015-05-23 MED ORDER — METOPROLOL TARTRATE 50 MG PO TABS
50.0000 mg | ORAL_TABLET | Freq: Two times a day (BID) | ORAL | Status: DC
  Administered 2015-05-23 – 2015-05-24 (×2): 50 mg via ORAL
  Filled 2015-05-23 (×2): qty 1

## 2015-05-23 MED ORDER — INFLUENZA VAC SPLIT QUAD 0.5 ML IM SUSY
0.5000 mL | PREFILLED_SYRINGE | INTRAMUSCULAR | Status: AC
  Administered 2015-05-24: 0.5 mL via INTRAMUSCULAR
  Filled 2015-05-23: qty 0.5

## 2015-05-23 NOTE — ED Notes (Signed)
Presents with left sided chest pain began 2-3 days ago started while working and exerting self, but now occurs at rest as well. Today the pain has been constant and described as tightness, pain radiates in to left arm associated with diaphoresis, SOB, dizziness and left arm numbness. Endorses mild nausea at times. s

## 2015-05-23 NOTE — H&P (Signed)
History and Physical  Patient Name: Christopher Davila     [id number]    DOB: 07/25/1970    DOA: 05/23/2015 Referring physician: Gurney Maxin, PA-C PCP: Carmie Kanner, NP      Chief Complaint: Chest pain  HPI: Christopher Davila is a 44 y.o. male with a past medical history significant for NIDDM, HTN, and smoking who presents with chest pain.  The patient has had precordial chest pain for over a year.  He had an observation admission one year ago because of this and was discharged without a stress test.  One month ago, he was seen at [facility] clinic and given ibuprofen for this sharp left sided chest discomfort, that did not alleviate his pain.  Now in the last two weeks, the patient has developed new dyspnea on exertion.  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  Since Thanksgiving, he has noted that he is much more winded when walking like this, and family noticed too.  In addition, his pain has returned and is more frequent.  It is non-exertional, sharp, left-sided and moderate in intensity.  It radiates to the left forearm and palm.  It is worse with inspiration.     In the ED, the patient's initial ECG showed old stable TWI but a new nonpathological Q wave in lead III.  A troponin was negative.  TRH was asked to admit for observation, serial troponins and risk stratification.     Review of Systems:  Patient seen 8:37 PM on 05/23/2015. Pt complains of dyspnea on exertion, which is new.  He also complains of pleuritic, left sided sharp chest pain. Pt denies any palpitations, orthopnea, leg swelling, PND.  All other systems negative except as just noted or noted in the history of present illness.  Allergies  Allergen Reactions  . Morphine And Related Itching and Other (See Comments)    Pt prefers not to be given this drug    Prior to Admission medications   Medication Sig Start Date End  Date Taking? Authorizing Provider  amLODipine (NORVASC) 10 MG tablet Take 10 mg by mouth daily.   Yes Historical Provider, MD  atorvastatin (LIPITOR) 40 MG tablet Take 1 tablet (40 mg total) by mouth daily. 02/15/14  Yes [place] N Rumley, DO  gabapentin (NEURONTIN) 800 MG tablet Take 800 mg by mouth 3 (three) times daily.   Yes Historical Provider, MD  lisinopril-hydrochlorothiazide (PRINZIDE,ZESTORETIC) 20-12.5 MG per tablet Take 1 tablet by mouth daily.   Yes Historical Provider, MD  metFORMIN (GLUCOPHAGE) 500 MG tablet Take 1 tablet (500 mg total) by mouth 2 (two) times daily with a meal. 07/27/14  Yes Leone Brand, MD  metoprolol (LOPRESSOR) 50 MG tablet Take 50 mg by mouth 2 (two) times daily.   Yes Historical Provider, MD  famotidine (PEPCID) 20 MG tablet Take 1 tablet (20 mg total) by mouth 2 (two) times daily. 12/13/14   Lacretia Leigh, MD  glipiZIDE (GLUCOTROL) 10 MG tablet Take 10 mg by mouth daily.    Historical Provider, MD  sucralfate (CARAFATE) 1 G tablet Take 1 tablet (1 g total) by mouth 4 (four) times daily. 12/13/14   Lacretia Leigh, MD    Past Medical History  Diagnosis Date  . Diabetes mellitus   . Hypertension   . Neuropathy of lower extremity   . Pancreatitis 06/2012    History reviewed. No pertinent past surgical  history.  Family history: family history includes CAD in his cousin; Diabetes in his maternal aunt and mother; Lung disease in his mother.  Social History: Patient lives with his cousin.  He works in a [employer].  He ambulates without assistance and is independent with all ADLs.  He smokes 2-3 cigarettes per day.       Physical Exam: BP 136/98 mmHg  Pulse 75  Temp(Src) 97.9 F (36.6 C) (Oral)  Resp 16  SpO2 99% General appearance: Well-developed, adult male, alert and in no acute distress.   Eyes: Anicteric, lids and lashes normal.     ENT: No nasal deformity, discharge, or epistaxis.  OP moist without lesions.   Skin: Warm and dry.   Cardiac: RRR, nl  S1-S2, no murmurs appreciated.  Capillary refill is brisk.  JVP normal.  No LE edema.  Radial pulses 2+ and symmetric.  No carotid bruits. Respiratory: Normal respiratory rate and rhythm.  CTAB without rales or wheezes. Abdomen: Abdomen soft without rigidity.  No TTP. No ascites, distension.   MSK: No deformities or effusions.  Pain reproduced with movement of arm and also with pressing on the left precordium. Neuro: Sensorium intact and responding to questions, attention normal.  Speech is fluent.  Moves all extremities equally and with normal coordination.    Psych: Behavior appropriate.  Affect normal.  No evidence of aural or visual hallucinations or delusions.       Labs on Admission:  The metabolic panel shows hyponatremia, hyperglycemia. The complete blood count shows no anemia or thrombocytopenia. The initial troponin is negative. Lipase normal.  Radiological Exams on Admission: Personally reviewed: Dg Chest 2 View  05/23/2015  CLINICAL DATA:  Chest pain EXAM: CHEST  2 VIEW COMPARISON:  03/24/2014 FINDINGS: Normal heart size and mediastinal contours. No acute infiltrate or edema. No effusion or pneumothorax. No acute osseous findings. IMPRESSION: Negative chest. Electronically Signed   By: Monte Fantasia M.D.   On: 05/23/2015 16:43    EKG: Independently reviewed. New Q wave, nonpathological in III.  Old TWI in lateral and inferior leads.  LVH old.  No new changes.    Assessment/Plan 1. Dyspnea on exertion and chest pain: This is new.  The patient has HEART score of 4. His dyspnea on exertion is accelerating and is associated with some vague and poorly described chest discomfort.  Differential includes CHF, ischemia, or deconditioning.    The discomfort itself is worsened with palpation, and is likely precordial, as has been diagnosed in the past.  Nonetheless the patient is high risk for ischemia, so we will observe serial troponins overnight and ask for risk stratification by  Cardiology tomorrow.  -Serial troponins are ordered -Telemetry -Echocardiogram is ordered -Consult to cardiology, appreciate recommendations -Start aspirin 81 mg daily  2. NIDDM:  Hold orals. Sliding scale corrections while inpatient  3. HTN:  Continue home amlodipine, HCTZ, lisinopril, metoprolol. Contineu home statin  4. Neuropathy:  Continue gabapentin     DVT PPx: Outpatient status Diet: NPO after 4am, may advance diet if no stress testing Consultants: Cardiology Code Status: Full Family Communication: None  Disposition Plan:  Observe for arrhythmia on telemetry, serial troponins and subsequent risk stratification by Cardiology.  If testing negative, home after.  Start aspirin, which patient should be on.  At the point of initial evaluation, it is my clinical opinion that admission for OBSERVATION is reasonable and necessary because the patient's presenting symptoms, physical exam findings, and initial radiographic and laboratory data in the context  of chronic comorbidities is felt to place him/her at high risk for further clinical deterioration but it is anticipated that the patient may be medically stable for discharge from the hospital within 24 to 48 hours.      Edwin Dada [facility] Pager [phone]

## 2015-05-23 NOTE — ED Provider Notes (Signed)
CSN: [id number]     Arrival date & time 05/23/15  1550 History   First MD Initiated Contact with Patient 05/23/15 1802     Chief Complaint  Patient presents with  . Chest Pain     (Consider location/radiation/quality/duration/timing/severity/associated sxs/prior Treatment) HPI Comments: Christopher Davila is a 44 y.o. male with a PMHx of DM2, HTN, neuropathy of LEs, and pancreatitis, who presents to the ED with complaints of gradual onset CP that has worsened over the last 2-3 days. Pt states that last week he had some CP, but it improved until yesterday, and today it worsened significantly which made him seek medical attention. He describes his pain as 8/10 constant sharp L sided chest pain radiating into the L arm worse with inspiration and exertion, with no tx tried PTA. States it seemed to initially occur only with exertion, and now it starts even at rest and significantly worsens with exertion. Associated symptoms include diaphoresis, "some" SOB which has improved today, lightheadedness, L arm paresthesia/tingling, and occasional nausea. He has a +FHx of CAD in his maternal aunt. +Smoker- 2 cigarettes daily.   Denies fevers, chills, LE swelling, recent travel/surgery/immobilization, family/personal hx of DVT/PE, claudication, orthopnea, cough, abd pain, V/D/C, hematuria, dysuria, myalgias, arthralgias, numbness, or weakness. He was seen last year for similar complaints and was admitted.  Patient is a 44 y.o. male presenting with chest pain. The history is provided by the patient and medical records. No language interpreter was used.  Chest Pain Pain location:  L chest Pain quality: sharp   Pain radiates to:  L arm Pain radiates to the back: no   Pain severity:  Moderate Onset quality:  Gradual Duration:  3 days Timing:  Constant Progression:  Worsening Chronicity:  Recurrent Context: at rest   Relieved by:  None tried Worsened by:  Deep breathing and exertion Ineffective treatments:  None  tried Associated symptoms: diaphoresis, nausea and shortness of breath ("some" but improved today)   Associated symptoms: no abdominal pain, no claudication, no cough, no fever, no heartburn, no lower extremity edema, no numbness, no orthopnea, not vomiting and no weakness   Risk factors: diabetes mellitus, hypertension, male sex and smoking   Risk factors: no high cholesterol, no immobilization, no prior DVT/PE and no surgery     Past Medical History  Diagnosis Date  . Diabetes mellitus   . Hypertension   . Neuropathy of lower extremity   . Pancreatitis 06/2012   History reviewed. No pertinent past surgical history. Family History  Problem Relation Age of Onset  . Diabetes Mother   . Diabetes Maternal Aunt    Social History  Substance Use Topics  . Smoking status: Current Every Day Smoker -- 0.25 packs/day    Types: Cigarettes  . Smokeless tobacco: None  . Alcohol Use: Yes    Review of Systems  Constitutional: Positive for diaphoresis. Negative for fever and chills.  Eyes: Negative for visual disturbance.  Respiratory: Positive for shortness of breath ("some" but improved today). Negative for cough and wheezing.   Cardiovascular: Positive for chest pain. Negative for orthopnea, claudication and leg swelling.  Gastrointestinal: Positive for nausea. Negative for heartburn, vomiting, abdominal pain, diarrhea and constipation.  Genitourinary: Negative for dysuria and hematuria.  Musculoskeletal: Negative for myalgias and arthralgias.  Skin: Negative for color change.  Allergic/Immunologic: Positive for immunocompromised state (diabetic).  Neurological: Positive for light-headedness. Negative for syncope, weakness and numbness.       +L arm paresthesia/tingling  Psychiatric/Behavioral: Negative for confusion.  10 Systems reviewed and are negative for acute change except as noted in the HPI.    Allergies  Morphine and related  Home Medications   Prior to Admission  medications   Medication Sig Start Date End Date Taking? Authorizing Provider  amLODipine (NORVASC) 10 MG tablet Take 10 mg by mouth daily.    Historical Provider, MD  atorvastatin (LIPITOR) 40 MG tablet Take 1 tablet (40 mg total) by mouth daily. 02/15/14   [place] N Rumley, DO  famotidine (PEPCID) 20 MG tablet Take 1 tablet (20 mg total) by mouth 2 (two) times daily. 12/13/14   Lacretia Leigh, MD  gabapentin (NEURONTIN) 300 MG capsule Take 300 mg by mouth 3 (three) times daily.    Historical Provider, MD  glipiZIDE (GLUCOTROL) 10 MG tablet Take 10 mg by mouth daily.    Historical Provider, MD  lisinopril-hydrochlorothiazide (PRINZIDE,ZESTORETIC) 20-12.5 MG per tablet Take 1 tablet by mouth daily.    Historical Provider, MD  metFORMIN (GLUCOPHAGE) 500 MG tablet Take 1 tablet (500 mg total) by mouth 2 (two) times daily with a meal. 07/27/14   Leone Brand, MD  metoprolol (LOPRESSOR) 50 MG tablet Take 50 mg by mouth 2 (two) times daily.    Historical Provider, MD  sucralfate (CARAFATE) 1 G tablet Take 1 tablet (1 g total) by mouth 4 (four) times daily. 12/13/14   Lacretia Leigh, MD   BP 134/94 mmHg  Pulse 88  Temp(Src) 97.9 F (36.6 C) (Oral)  Resp 20  SpO2 99% Physical Exam  Constitutional: He is oriented to person, place, and time. Vital signs are normal. He appears well-developed and well-nourished.  Non-toxic appearance. No distress.  Afebrile, nontoxic, NAD  HENT:  Head: Normocephalic and atraumatic.  Mouth/Throat: Oropharynx is clear and moist and mucous membranes are normal.  Eyes: Conjunctivae and EOM are normal. Right eye exhibits no discharge. Left eye exhibits no discharge.  Neck: Normal range of motion. Neck supple.  Cardiovascular: Normal rate, regular rhythm, normal heart sounds and intact distal pulses.  Exam reveals no gallop and no friction rub.   No murmur heard. RRR, nl s1/s2, no m/r/g, distal pulses intact, no pedal edema   Pulmonary/Chest: Effort normal and breath sounds  normal. No respiratory distress. He has no decreased breath sounds. He has no wheezes. He has no rhonchi. He has no rales. He exhibits tenderness. He exhibits no crepitus, no deformity and no retraction.    CTAB in all lung fields, no w/r/r, no hypoxia or increased WOB, speaking in full sentences, SpO2 99% on RA  Chest wall mildly TTP near epigastrum, no crepitus or retractions, no deformities  Abdominal: Soft. Normal appearance and bowel sounds are normal. He exhibits no distension. There is tenderness in the epigastric area. There is no rigidity, no rebound, no guarding, no CVA tenderness, no tenderness at McBurney's point and negative Murphy's sign.    Soft, nondistended, +BS throughout, with mild epigastric TTP, no r/g/r, neg murphy's, neg mcburney's, no CVA TTP   Musculoskeletal: Normal range of motion.  MAE x4 Strength and sensation grossly intact Distal pulses intact No pedal edema, neg homan's bilaterally  Gait steady  Neurological: He is alert and oriented to person, place, and time. He has normal strength. No sensory deficit.  No focal neuro deficits Strength and sensation grossly intact in all extremities  Skin: Skin is warm, dry and intact. No rash noted.  Psychiatric: He has a normal mood and affect.  Nursing note and vitals reviewed.   ED  Course  Procedures (including critical care time) Labs Review Labs Reviewed  BASIC METABOLIC PANEL - Abnormal; Notable for the following:    Sodium 131 (*)    Chloride 96 (*)    Glucose, Bld 243 (*)    All other components within normal limits  CBC  I-STAT TROPOININ, ED    Imaging Review Dg Chest 2 View  05/23/2015  CLINICAL DATA:  Chest pain EXAM: CHEST  2 VIEW COMPARISON:  03/24/2014 FINDINGS: Normal heart size and mediastinal contours. No acute infiltrate or edema. No effusion or pneumothorax. No acute osseous findings. IMPRESSION: Negative chest. Electronically Signed   By: Monte Fantasia M.D.   On: 05/23/2015 16:43   I  have personally reviewed and evaluated these images and lab results as part of my medical decision-making.   EKG Interpretation   Date/Time:  Tuesday May 23 2015 15:55:36 EST Ventricular Rate:  91 PR Interval:  146 QRS Duration: 98 QT Interval:  348 QTC Calculation: 428 R Axis:   51 Text Interpretation:  Normal sinus rhythm Minimal voltage criteria for  LVH, may be normal variant Cannot rule out Inferior infarct , age  undetermined Abnormal ECG inferior Q wave c/w old. LVH no STEMI Confirmed  by Johnney Killian, MD, Jeannie Done [phone]) on 05/23/2015 4:02:26 PM      MDM   Final diagnoses:  Chest pain, unspecified chest pain type  Nausea  Diaphoresis  Lightheadedness  Essential hypertension  SOB (shortness of breath)    44 y.o. male here with left-sided chest pain radiating to his left arm, some shortness of breath, diaphoresis, lightheadedness, paresthesia left arm, and occasional nausea. Pertinent negative, no tachycardia or hypoxia, doubt PE. Troponin negative, BMP with hyperglycemia and pseudohyponatremia from hyperglycemia. CBC unremarkable. CXR clear. EKG with some Q waves and inverted Ts, different from EKG in February. Heart score 4. Will give ASA,  GI cocktail, fentanyl, and NTG to see if pain improves but will admit for ACS r/o.   7:30 PM Some delays with page going out, but Dr. Loleta Books of [facility] returning call, will admit to tele obs. Please see his notes for further documentation of care.   BP 138/87 mmHg  Pulse 81  Temp(Src) 97.9 F (36.6 C) (Oral)  Resp 19  SpO2 97%  Meds ordered this encounter  Medications  . aspirin tablet 325 mg    Sig:   . nitroGLYCERIN (NITROSTAT) SL tablet 0.4 mg    Sig:   . gi cocktail (Maalox,Lidocaine,Donnatal)    Sig:   . fentaNYL (SUBLIMAZE) injection 50 mcg    Sig:      Marwa Fuhrman Camprubi-Soms, PA-C 05/23/15 [place], DO 05/23/15 2331

## 2015-05-24 ENCOUNTER — Observation Stay (HOSPITAL_COMMUNITY): Payer: Self-pay

## 2015-05-24 ENCOUNTER — Ambulatory Visit (HOSPITAL_BASED_OUTPATIENT_CLINIC_OR_DEPARTMENT_OTHER): Payer: Self-pay

## 2015-05-24 DIAGNOSIS — R110 Nausea: Secondary | ICD-10-CM | POA: Insufficient documentation

## 2015-05-24 DIAGNOSIS — E1142 Type 2 diabetes mellitus with diabetic polyneuropathy: Secondary | ICD-10-CM

## 2015-05-24 DIAGNOSIS — R079 Chest pain, unspecified: Secondary | ICD-10-CM

## 2015-05-24 DIAGNOSIS — R0600 Dyspnea, unspecified: Secondary | ICD-10-CM

## 2015-05-24 DIAGNOSIS — E785 Hyperlipidemia, unspecified: Secondary | ICD-10-CM | POA: Insufficient documentation

## 2015-05-24 DIAGNOSIS — I10 Essential (primary) hypertension: Secondary | ICD-10-CM | POA: Insufficient documentation

## 2015-05-24 DIAGNOSIS — R0602 Shortness of breath: Secondary | ICD-10-CM | POA: Insufficient documentation

## 2015-05-24 LAB — NM MYOCAR MULTI W/SPECT W/WALL MOTION / EF
CHL CUP NUCLEAR SDS: 0
CHL CUP NUCLEAR SSS: 3
CHL CUP RESTING HR STRESS: 72 {beats}/min
CSEPPHR: 102 {beats}/min
LHR: 0
LVDIAVOL: 52 mL
LVSYSVOL: 47 mL
SRS: 3
TID: 1.23

## 2015-05-24 LAB — LIPID PANEL
CHOLESTEROL: 168 mg/dL (ref 0–200)
HDL: 44 mg/dL (ref >40)
LDL CALC: 66 mg/dL (ref 0–99)
TRIGLYCERIDES: 288 mg/dL — AB (ref <150)
Total CHOL/HDL Ratio: 3.8 RATIO
VLDL: 58 mg/dL — ABNORMAL HIGH (ref 0–40)

## 2015-05-24 LAB — TROPONIN I: Troponin I: <0.03 ng/mL (ref <0.031)

## 2015-05-24 LAB — GLUCOSE, CAPILLARY
GLUCOSE-CAPILLARY: 218 mg/dL — AB (ref 65–99)
Glucose-Capillary: 192 mg/dL — ABNORMAL HIGH (ref 65–99)

## 2015-05-24 LAB — LIPASE, BLOOD: Lipase: 36 U/L (ref 11–51)

## 2015-05-24 MED ORDER — ASPIRIN EC 81 MG PO TBEC: 81.0000 mg | DELAYED_RELEASE_TABLET | Freq: Every day | ORAL | Status: DC

## 2015-05-24 MED ORDER — REGADENOSON 0.4 MG/5ML IV SOLN
INTRAVENOUS | Status: AC
  Filled 2015-05-24: qty 5

## 2015-05-24 MED ORDER — TECHNETIUM TC 99M SESTAMIBI GENERIC - CARDIOLITE
10.0000 | Freq: Once | INTRAVENOUS | Status: AC | PRN
  Administered 2015-05-24: 10 via INTRAVENOUS

## 2015-05-24 MED ORDER — REGADENOSON 0.4 MG/5ML IV SOLN
0.4000 mg | Freq: Once | INTRAVENOUS | Status: AC
  Administered 2015-05-24: 0.4 mg via INTRAVENOUS
  Filled 2015-05-24: qty 5

## 2015-05-24 MED ORDER — TECHNETIUM TC 99M SESTAMIBI GENERIC - CARDIOLITE
30.0000 | Freq: Once | INTRAVENOUS | Status: AC | PRN
  Administered 2015-05-24: 30 via INTRAVENOUS

## 2015-05-24 NOTE — Progress Notes (Signed)
  Echocardiogram 2D Echocardiogram has been performed.  Christopher Davila 05/24/2015, 3:08 PM

## 2015-05-24 NOTE — Consult Note (Signed)
CARDIOLOGY CONSULT NOTE   Patient ID: Christopher Davila MRN: [id number] DOB/AGE: 07/09/1970 44 y.o.  Admit date: 05/23/2015  Primary Physician   Carmie Kanner, NP Primary Cardiologist   Dr. Elder Negus (seen during last admission 2015, never in clinic) Reason for Consultation   CP  HPI: Christopher Davila is a 44 y.o. male with a history of hypertension, diabetes, neuropathy, pancreatitis and current tobacco smoking who came to ED yesterday with ongoing chest pain.  The patient has being having left upper chest pain for over a year. Previously felt that his chest pain is most likely musculoskeletal. Never had stress test. [facility] by nurse and given NSAID but no improvement. Previously his pain was mostly on exertion now at rest. He walks 1-2 miles few times in a week for job. Recently noticed dyspnea on exertion which is new. Since Thanksgiving, his pain has chest pain is more frequent. He described the pain as achy at left upper chest. Over the weekend, he had chest pain at rest that was associated with nausea, diaphoresis and radiation to left arm. The pain resolved with rest in few minutes. Yesterday at work, he again developed the pain with somewhat blurred vision and diaphoresis and brought to ED via EMS. Currently chest pain-free. Previously smoked for 10 year then quit for 8 years and now smoking 2 cigarettes a day for the past 2 years. Denies history of illicit drug use or recent travel. Cousin has a heart failure. The patient denies  vomiting, fever,  palpitations, orthopnea, PND, syncope, cough, congestion, abdominal pain, hematochezia, melena, lower extremity edema.  EKG showed old T-wave inversion in inferior lateral lead, however nonpathological Q waves in lead III. Troponin x 3 negative. Triglycerides  288. LDL 58. Chest x-ray clear. Echo pending. Blood sugar elevated.    Past Medical History  Diagnosis Date  . Diabetes mellitus   . Hypertension   .  Neuropathy of lower extremity   . Pancreatitis 06/2012     History reviewed. No pertinent past surgical history.  Allergies  Allergen Reactions  . Morphine And Related Itching and Other (See Comments)    Pt prefers not to be given this drug    I have reviewed the patient's current medications . amLODipine  10 mg Oral Daily  . aspirin EC  81 mg Oral Daily  . atorvastatin  40 mg Oral q1800  . enoxaparin (LOVENOX) injection  40 mg Subcutaneous Q24H  . gabapentin  800 mg Oral TID  . hydrochlorothiazide  12.5 mg Oral Daily  . Influenza vac split quadrivalent PF  0.5 mL Intramuscular Tomorrow-1000  . insulin aspart  0-15 Units Subcutaneous TID WC  . insulin aspart  0-5 Units Subcutaneous QHS  . lisinopril  20 mg Oral Daily  . metoprolol  50 mg Oral BID   . sodium chloride     acetaminophen, gi cocktail, nitroGLYCERIN, ondansetron (ZOFRAN) IV  Prior to Admission medications   Medication Sig Start Date End Date Taking? Authorizing Provider  amLODipine (NORVASC) 10 MG tablet Take 10 mg by mouth daily.   Yes Historical Provider, MD  atorvastatin (LIPITOR) 40 MG tablet Take 1 tablet (40 mg total) by mouth daily. 02/15/14  Yes Dahlgren N Rumley, DO  gabapentin (NEURONTIN) 800 MG tablet Take 800 mg by mouth 3 (three) times daily.   Yes Historical Provider, MD  lisinopril-hydrochlorothiazide (PRINZIDE,ZESTORETIC) 20-12.5 MG per tablet Take 1 tablet by mouth daily.   Yes Historical Provider, MD  metFORMIN (GLUCOPHAGE)  500 MG tablet Take 1 tablet (500 mg total) by mouth 2 (two) times daily with a meal. 07/27/14  Yes Leone Brand, MD  metoprolol (LOPRESSOR) 50 MG tablet Take 50 mg by mouth 2 (two) times daily.   Yes Historical Provider, MD  famotidine (PEPCID) 20 MG tablet Take 1 tablet (20 mg total) by mouth 2 (two) times daily. 12/13/14   Lacretia Leigh, MD     Social History   Social History  . Marital Status: Single    Spouse Name: N/A  . Number of Children: N/A  . Years of Education:  N/A   Occupational History  . Not on file.   Social History Main Topics  . Smoking status: Current Every Day Smoker -- 0.00 packs/day    Types: Cigarettes  . Smokeless tobacco: Not on file  . Alcohol Use: Yes     Comment: on the weekends per patient  . Drug Use: No     Comment: per pt* smoking only two cigarettes per day, trying to quit   . Sexual Activity: Not on file   Other Topics Concern  . Not on file   Social History Narrative    Family Status  Relation Status Death Age  . Mother Alive   . Maternal Aunt Alive   . Cousin Alive   . Sister Alive    Family History  Problem Relation Age of Onset  . Diabetes Mother   . Lung disease Mother   . Diabetes Maternal Aunt   . CAD Maternal Aunt   . CAD Cousin       ROS:  Full 14 point review of systems complete and found to be negative unless listed above.  Physical Exam: Blood pressure 127/84, pulse 79, temperature 98 F (36.7 C), temperature source Oral, resp. rate 15, height 5\' 3"  (1.6 m), weight 143 lb 11.2 oz (65.182 kg), SpO2 100 %.  General: Well developed, well nourished, male in no acute distress Head: Eyes PERRLA, No xanthomas. Normocephalic and atraumatic, oropharynx without edema or exudate.  Lungs: Resp regular and unlabored, CTA. Heart: RRR no s3, s4, or murmurs..   Neck: No carotid bruits. No lymphadenopathy. No JVD. Abdomen: Bowel sounds present, abdomen soft and non-tender without masses or hernias noted. Msk:  No spine or cva tenderness. No weakness, no joint deformities or effusions. Extremities: No clubbing, cyanosis or edema. DP/PT/Radials 2+ and equal bilaterally. Neuro: Alert and oriented X 3. No focal deficits noted. Psych:  Good affect, responds appropriately Skin: No rashes or lesions noted.  Labs:   Lab Results  Component Value Date   WBC 8.6 05/23/2015   HGB 13.2 05/23/2015   HCT 40.0 05/23/2015   MCV 87.5 05/23/2015   PLT 215 05/23/2015   No results for input(s): INR in the last 72  hours.  Recent Labs Lab 05/23/15 1607  NA 131*  K 3.8  CL 96*  CO2 23  BUN 16  CREATININE 1.12  CALCIUM 9.8  GLUCOSE 243*   MAGNESIUM  Date Value Ref Range Status  12/22/2007 1.9  Final    Recent Labs  05/23/15 2156 05/24/15 0041 05/24/15 0340  TROPONINI <0.03 <0.03 <0.03    Recent Labs  05/23/15 1623  TROPIPOC 0.00   PRO B NATRIURETIC PEPTIDE (BNP)  Date/Time Value Ref Range Status  02/14/2014 12:55 PM 9.2 0 - 125 pg/mL Final   Lab Results  Component Value Date   CHOL 168 05/24/2015   HDL 44 05/24/2015   LDLCALC 66 05/24/2015  TRIG 288* 05/24/2015    Echo: Pending  ECG:  Vent. rate 91 BPM PR interval 146 ms QRS duration 98 ms QT/QTc 348/428 ms P-R-T axes 35 51 -44  Radiology:  Dg Chest 2 View  05/23/2015  CLINICAL DATA:  Chest pain EXAM: CHEST  2 VIEW COMPARISON:  03/24/2014 FINDINGS: Normal heart size and mediastinal contours. No acute infiltrate or edema. No effusion or pneumothorax. No acute osseous findings. IMPRESSION: Negative chest. Electronically Signed   By: Monte Fantasia M.D.   On: 05/23/2015 16:43    ASSESSMENT AND PLAN:     1. Chest pain - Has typical and atypical features. Previously felt it was due to musculoskeletal in nature. However, ongoing for more than a year. Now he has pain at rest along with nausea, diaphoresis and radiation to left arm. - EKG non-conclusive. Troponin x 3 negative. - Echocardiogram ordered during admission pending. - Will Treadmill Myoview.  2. Diabetes ([place]) - Serum glucose running high. Management per primary. Consider hemoglobin A1c for risk stratification.  3. Hypertension - Stable and well controlled. Continue current regimen.  4. Hypertriglyceridemia - Triglyce 288. Also has sugar of 243. History of pancreatitis - will check lipase. However asymptomatic. - Continue Lipitor 40 mg for now. Consider adding Lovezza/fenofibrate.  5. Tobacco abuse.  - Currently smoking 2 cigarettes today. He has  stopped smoking for 8 years now smoking status for the past 2 years. Encouraged smoking cessation. Patient given   SignedLeanor Kail, [place] 05/24/2015, 8:50 AM Pager [phone]  Co-Sign MD

## 2015-05-24 NOTE — Discharge Summary (Signed)
PATIENT DETAILS Name: Christopher Davila Age: 44 y.o. Sex: male Date of Birth: Feb 06, 1971 MRN: [id number]. Admitting Physician: Edwin Dada, MD [id number] H, NP  Admit Date: 05/23/2015 Discharge date: 05/24/2015  Recommendations for Outpatient Follow-up:  1. Optimize DM/HTN treatment 2. Age appropriate general health maintenance  3. Counseled regarding importance of completely stopping tobacco use.   PRIMARY DISCHARGE DIAGNOSIS:  Principal Problem:   Chest pain Active Problems:   Diabetes (HCC)   Hypertension   Essential hypertension   Nausea   SOB (shortness of breath)   Hyperlipidemia      PAST MEDICAL HISTORY: Past Medical History  Diagnosis Date  . Diabetes mellitus   . Hypertension   . Neuropathy of lower extremity   . Pancreatitis 06/2012    DISCHARGE MEDICATIONS: Current Discharge Medication List    START taking these medications   Details  aspirin EC 81 MG tablet Take 1 tablet (81 mg total) by mouth daily.      CONTINUE these medications which have NOT CHANGED   Details  amLODipine (NORVASC) 10 MG tablet Take 10 mg by mouth daily.    atorvastatin (LIPITOR) 40 MG tablet Take 1 tablet (40 mg total) by mouth daily. Qty: 90 tablet, Refills: 0    gabapentin (NEURONTIN) 800 MG tablet Take 800 mg by mouth 3 (three) times daily.    lisinopril-hydrochlorothiazide (PRINZIDE,ZESTORETIC) 20-12.5 MG per tablet Take 1 tablet by mouth daily.    metFORMIN (GLUCOPHAGE) 500 MG tablet Take 1 tablet (500 mg total) by mouth 2 (two) times daily with a meal. Qty: 60 tablet, Refills: 0    metoprolol (LOPRESSOR) 50 MG tablet Take 50 mg by mouth 2 (two) times daily.      STOP taking these medications     famotidine (PEPCID) 20 MG tablet         ALLERGIES:   Allergies  Allergen Reactions  . Morphine And Related Itching and Other (See Comments)    Pt prefers not to be given this drug    BRIEF HPI:  See H&P, Labs, Consult and Test reports for all  details in brief, patient was admitted for evaluation of chest pain  CONSULTATIONS:   cardiology  PERTINENT RADIOLOGIC STUDIES: Dg Chest 2 View  05/23/2015  CLINICAL DATA:  Chest pain EXAM: CHEST  2 VIEW COMPARISON:  03/24/2014 FINDINGS: Normal heart size and mediastinal contours. No acute infiltrate or edema. No effusion or pneumothorax. No acute osseous findings. IMPRESSION: Negative chest. Electronically Signed   By: Monte Fantasia M.D.   On: 05/23/2015 16:43   Nm Myocar Multi W/spect W/wall Motion / Ef  05/24/2015   No T wave inversion was noted during stress.  There was no ST segment deviation noted during stress.  The study is normal. No evidence of ischemia. No infarction  This is a low risk study.  Nuclear stress EF: 53%.      PERTINENT LAB RESULTS: CBC:  Recent Labs  05/23/15 1607  WBC 8.6  HGB 13.2  HCT 40.0  PLT 215   CMET CMP     Component Value Date/Time   NA 131* 05/23/2015 1607   K 3.8 05/23/2015 1607   CL 96* 05/23/2015 1607   CO2 23 05/23/2015 1607   GLUCOSE 243* 05/23/2015 1607   BUN 16 05/23/2015 1607   CREATININE 1.12 05/23/2015 1607   CALCIUM 9.8 05/23/2015 1607   PROT 8.3* 12/13/2014 1808   ALBUMIN 4.3 12/13/2014 1808   AST 27 12/13/2014 1808   ALT 28  12/13/2014 1808   ALKPHOS 78 12/13/2014 1808   BILITOT 0.7 12/13/2014 1808   GFRNONAA >60 05/23/2015 1607   GFRAA >60 05/23/2015 1607    GFR Estimated Creatinine Clearance: 68.4 mL/min (by C-G formula based on Cr of 1.12).  Recent Labs  05/24/15 1242  LIPASE 36    Recent Labs  05/23/15 2156 05/24/15 0041 05/24/15 0340  TROPONINI <0.03 <0.03 <0.03   Invalid input(s): POCBNP No results for input(s): DDIMER in the last 72 hours. No results for input(s): HGBA1C in the last 72 hours.  Recent Labs  05/24/15 0340  CHOL 168  HDL 44  LDLCALC 66  TRIG 288*  CHOLHDL 3.8   No results for input(s): TSH, T4TOTAL, T3FREE, THYROIDAB in the last 72 hours.  Invalid input(s):  FREET3 No results for input(s): VITAMINB12, FOLATE, FERRITIN, TIBC, IRON, RETICCTPCT in the last 72 hours. Coags: No results for input(s): INR in the last 72 hours.  Invalid input(s): PT Microbiology: No results found for this or any previous visit (from the past 240 hour(s)).   BRIEF HOSPITAL COURSE:   Principal Problem: Chest pain: Resolved, mostly with atypical features. Nuclear stress test was negative. 2-D echocardiogram did not show any abnormal wall motion. No further recommendations from cardiology, stable for discharge.  Active Problems: Diabetes: CBGs relatively stable during this hospitalization, A1c pending at the time of discharge-defer further optimization to the outpatient setting  Hypertension: Controlled, continue amlodipine, metoprolol. lisinopril and HCTZ.  Hyperlipidemia: Continue with statin. Further optimization of lipids deferred to the outpatient setting.  Tobacco abuse: Counseled regarding complete cessation-he has cut down significantly and only smokes 2 cigarettes a day.  TODAY-DAY OF DISCHARGE:  Subjective:   Christopher Davila today has no headache,no chest abdominal pain,no new weakness tingling or numbness, feels much better wants to go home today.   Objective:   Blood pressure 130/87, pulse 83, temperature 97.7 F (36.5 C), temperature source Oral, resp. rate 16, height 5\' 3"  (1.6 m), weight 65.182 kg (143 lb 11.2 oz), SpO2 97 %.  Intake/Output Summary (Last 24 hours) at 05/24/15 1532 Last data filed at 05/24/15 1400  Gross per 24 hour  Intake    480 ml  Output    775 ml  Net   -295 ml   Filed Weights   05/23/15 2051 05/24/15 0400  Weight: 66.951 kg (147 lb 9.6 oz) 65.182 kg (143 lb 11.2 oz)    Exam Awake Alert, Oriented *3, No new F.N deficits, Normal affect [place].AT,PERRAL Supple Neck,No JVD, No cervical lymphadenopathy appriciated.  Symmetrical Chest wall movement, Good air movement bilaterally, CTAB RRR,No Gallops,Rubs or new Murmurs, No  Parasternal Heave +ve B.Sounds, Abd Soft, Non tender, No organomegaly appriciated, No rebound -guarding or rigidity. No Cyanosis, Clubbing or edema, No new Rash or bruise  DISCHARGE CONDITION: Stable  DISPOSITION: Home  DISCHARGE INSTRUCTIONS:    Activity:  As tolerated   Get Medicines reviewed and adjusted: Please take all your medications with you for your next visit with your Primary MD  Please request your Primary MD to go over all hospital tests and procedure/radiological results at the follow up, please ask your Primary MD to get all Hospital records sent to his/her office.  If you experience worsening of your admission symptoms, develop shortness of breath, life threatening emergency, suicidal or homicidal thoughts you must seek medical attention immediately by calling 911 or calling your MD immediately  if symptoms less severe.  You must read complete instructions/literature along with all the possible adverse  reactions/side effects for all the Medicines you take and that have been prescribed to you. Take any new Medicines after you have completely understood and accpet all the possible adverse reactions/side effects.   Do not drive when taking Pain medications.   Do not take more than prescribed Pain, Sleep and Anxiety Medications  Special Instructions: If you have smoked or chewed Tobacco  in the last 2 yrs please stop smoking, stop any regular Alcohol  and or any Recreational drug use.  Wear Seat belts while driving.  Please note  You were cared for by a hospitalist during your hospital stay. Once you are discharged, your primary care physician will handle any further medical issues. Please note that NO REFILLS for any discharge medications will be authorized once you are discharged, as it is imperative that you return to your primary care physician (or establish a relationship with a primary care physician if you do not have one) for your aftercare needs so that they can  reassess your need for medications and monitor your lab values.   Diet recommendation: Diabetic Diet Heart Healthy diet  Discharge Instructions    Call MD for:  persistant nausea and vomiting    Complete by:  As directed      Call MD for:  severe uncontrolled pain    Complete by:  As directed      Diet - low sodium heart healthy    Complete by:  As directed      Diet Carb Modified    Complete by:  As directed      Increase activity slowly    Complete by:  As directed            Follow-up Information    Follow up with [facility]. Schedule an appointment as soon as possible for a visit in 1 week.   Contact information:   [place] [phone]     Total Time spent on discharge equals 25  minutes.  SignedOren Binet 05/24/2015 3:32 PM

## 2015-05-24 NOTE — Progress Notes (Signed)
Treadmill was changed to [place] since he received 50mg  of lopressor this morning.  He tolerated it well.  Blonnie Maske, PAC

## 2015-05-24 NOTE — Progress Notes (Signed)
Discharge teaching and education reviewed. VSS. Pt discharging [place] via [facility]. Requesting Dr. Radene Ou

## 2015-05-24 NOTE — Hospital Discharge Follow-Up (Signed)
[facility]:  This Case Manager received communication from Jacqlyn Krauss, RN CM who indicated patient needing hospital follow-up appointment. Presented to the hospital with chest pain.  Hospital follow-up appointment obtained on 06/02/15 at 62 with Zettie Pho, PA.  Appointment placed on AVS.  Jacqlyn Krauss, RN CM updated.

## 2015-05-25 LAB — HEMOGLOBIN A1C
HEMOGLOBIN A1C: 9.3 % — AB (ref 4.8–5.6)
Mean Plasma Glucose: 220 mg/dL

## 2015-06-02 ENCOUNTER — Inpatient Hospital Stay: Payer: Self-pay

## 2015-11-28 ENCOUNTER — Encounter (HOSPITAL_COMMUNITY): Payer: Self-pay

## 2015-11-28 ENCOUNTER — Emergency Department (HOSPITAL_COMMUNITY)
Admission: EM | Admit: 2015-11-28 | Discharge: 2015-11-28 | Disposition: A | Discharge status: Home / Self Care | Payer: MEDICAID | Attending: Emergency Medicine | Admitting: Emergency Medicine

## 2015-11-28 DIAGNOSIS — Y939 Activity, unspecified: Secondary | ICD-10-CM | POA: Insufficient documentation

## 2015-11-28 DIAGNOSIS — Y929 Unspecified place or not applicable: Secondary | ICD-10-CM | POA: Insufficient documentation

## 2015-11-28 DIAGNOSIS — F17210 Nicotine dependence, cigarettes, uncomplicated: Secondary | ICD-10-CM | POA: Insufficient documentation

## 2015-11-28 DIAGNOSIS — S0502XA Injury of conjunctiva and corneal abrasion without foreign body, left eye, initial encounter: Secondary | ICD-10-CM | POA: Insufficient documentation

## 2015-11-28 DIAGNOSIS — Z7982 Long term (current) use of aspirin: Secondary | ICD-10-CM | POA: Insufficient documentation

## 2015-11-28 DIAGNOSIS — Y999 Unspecified external cause status: Secondary | ICD-10-CM | POA: Insufficient documentation

## 2015-11-28 DIAGNOSIS — E1121 Type 2 diabetes mellitus with diabetic nephropathy: Secondary | ICD-10-CM | POA: Insufficient documentation

## 2015-11-28 DIAGNOSIS — Z79899 Other long term (current) drug therapy: Secondary | ICD-10-CM | POA: Insufficient documentation

## 2015-11-28 DIAGNOSIS — Z7984 Long term (current) use of oral hypoglycemic drugs: Secondary | ICD-10-CM | POA: Insufficient documentation

## 2015-11-28 DIAGNOSIS — I10 Essential (primary) hypertension: Secondary | ICD-10-CM | POA: Insufficient documentation

## 2015-11-28 LAB — CBG MONITORING, ED: Glucose-Capillary: 341 mg/dL — ABNORMAL HIGH (ref 65–99)

## 2015-11-28 MED ORDER — CYCLOPENTOLATE HCL 1 % OP SOLN
2.0000 [drp] | Freq: Once | OPHTHALMIC | Status: AC
  Administered 2015-11-28: 2 [drp] via OPHTHALMIC
  Filled 2015-11-28: qty 2

## 2015-11-28 MED ORDER — IBUPROFEN 400 MG PO TABS
400.0000 mg | ORAL_TABLET | Freq: Once | ORAL | Status: AC
  Administered 2015-11-28: 400 mg via ORAL
  Filled 2015-11-28: qty 1

## 2015-11-28 MED ORDER — OXYCODONE-ACETAMINOPHEN 5-325 MG PO TABS
2.0000 | ORAL_TABLET | Freq: Once | ORAL | Status: AC
  Administered 2015-11-28: 2 via ORAL
  Filled 2015-11-28: qty 2

## 2015-11-28 MED ORDER — TRAMADOL HCL 50 MG PO TABS: 50.0000 mg | ORAL_TABLET | Freq: Four times a day (QID) | ORAL | Status: DC | PRN

## 2015-11-28 MED ORDER — POLYMYXIN B-TRIMETHOPRIM 10000-0.1 UNIT/ML-% OP SOLN
1.0000 [drp] | OPHTHALMIC | Status: AC
  Administered 2015-11-28: 1 [drp] via OPHTHALMIC
  Filled 2015-11-28: qty 10

## 2015-11-28 MED ORDER — FLUORESCEIN SODIUM 1 MG OP STRP
1.0000 | ORAL_STRIP | Freq: Once | OPHTHALMIC | Status: AC
  Administered 2015-11-28: 1 via OPHTHALMIC
  Filled 2015-11-28: qty 1

## 2015-11-28 MED ORDER — TETRACAINE HCL 0.5 % OP SOLN
2.0000 [drp] | Freq: Once | OPHTHALMIC | Status: AC
  Administered 2015-11-28: 2 [drp] via OPHTHALMIC
  Filled 2015-11-28: qty 2
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
## 2015-11-28 NOTE — Discharge Instructions (Signed)
Corneal Abrasion The cornea is the clear covering at the front and center of the eye. When you look at the colored portion of the eye, you are looking through the cornea. It is a thin tissue made up of layers. The top layer is the most sensitive layer. A corneal abrasion happens if this layer is scratched or an injury causes it to come off.  HOME CARE  You may be given drops or a medicated cream. Use the medicine as told by your doctor.  A pressure patch may be put over the eye. If this is done, follow your doctor's instructions for when to remove the patch. Do not drive or use machines while the eye patch is on. Judging distances is hard to do with a patch on.  See your doctor for a follow-up exam if you are told to do so. It is very important that you keep this appointment. GET HELP IF:   You have pain, are sensitive to light, and have a scratchy feeling in one eye or both eyes.  Your pressure patch keeps getting loose. You can blink your eye under the patch.  You have fluid coming from your eye or the lids stick together in the morning.  You have the same symptoms in the morning that you did with the first abrasion. This could be days, weeks, or months after the first abrasion healed.   This information is not intended to replace advice given to you by your health care provider. Make sure you discuss any questions you have with your health care provider.   Document Released: 11/20/2007 Document Revised: 02/22/2015 Document Reviewed: 02/08/2013 Elsevier Interactive Patient Education [employer].
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
## 2016-06-06 ENCOUNTER — Emergency Department (HOSPITAL_COMMUNITY)
Admission: EM | Admit: 2016-06-06 | Discharge: 2016-06-06 | Disposition: A | Discharge status: Home / Self Care | Payer: Self-pay | Attending: Emergency Medicine | Admitting: Emergency Medicine

## 2016-06-06 ENCOUNTER — Encounter (HOSPITAL_COMMUNITY): Payer: Self-pay

## 2016-06-06 DIAGNOSIS — F17210 Nicotine dependence, cigarettes, uncomplicated: Secondary | ICD-10-CM | POA: Insufficient documentation

## 2016-06-06 DIAGNOSIS — K047 Periapical abscess without sinus: Secondary | ICD-10-CM | POA: Insufficient documentation

## 2016-06-06 DIAGNOSIS — Z7982 Long term (current) use of aspirin: Secondary | ICD-10-CM | POA: Insufficient documentation

## 2016-06-06 DIAGNOSIS — Z7984 Long term (current) use of oral hypoglycemic drugs: Secondary | ICD-10-CM | POA: Insufficient documentation

## 2016-06-06 DIAGNOSIS — E1140 Type 2 diabetes mellitus with diabetic neuropathy, unspecified: Secondary | ICD-10-CM | POA: Insufficient documentation

## 2016-06-06 DIAGNOSIS — I10 Essential (primary) hypertension: Secondary | ICD-10-CM | POA: Insufficient documentation

## 2016-06-06 MED ORDER — NAPROXEN 500 MG PO TABS: 500.0000 mg | ORAL_TABLET | Freq: Two times a day (BID) | ORAL | 0 refills | Status: DC | PRN

## 2016-06-06 MED ORDER — PENICILLIN V POTASSIUM 500 MG PO TABS: 500.0000 mg | ORAL_TABLET | Freq: Four times a day (QID) | ORAL | 0 refills | Status: AC

## 2016-06-06 MED ORDER — PENICILLIN V POTASSIUM 250 MG PO TABS
500.0000 mg | ORAL_TABLET | Freq: Once | ORAL | Status: AC
  Administered 2016-06-06: 500 mg via ORAL
  Filled 2016-06-06: qty 2

## 2016-06-06 MED ORDER — HYDROCODONE-ACETAMINOPHEN 5-325 MG PO TABS
1.0000 | ORAL_TABLET | Freq: Once | ORAL | Status: AC
  Administered 2016-06-06: 1 via ORAL
  Filled 2016-06-06: qty 1

## 2016-06-06 NOTE — ED Triage Notes (Signed)
Per Pt, PT is coming from home with complaints of left face swelling and tooth abscess that patient noticed starting two days ago. Denies fevers.

## 2016-06-06 NOTE — ED Notes (Signed)
Dental Box at bedside.

## 2016-06-06 NOTE — Discharge Instructions (Signed)
You have a dental injury. It is very important that you get evaluated by a dentist as soon as possible. Call tomorrow to schedule an appointment. Naproxen as needed for pain. Take your full course of antibiotics. Read the instructions below.  Eat a soft or liquid diet and rinse your mouth out after meals with warm water. You should see a dentist or return here at once if you have increased swelling, increased pain or uncontrolled bleeding from the site of your injury.  SEEK MEDICAL CARE IF:  You have increased pain not controlled with medicines.  You have swelling around your tooth, in your face or neck.  You have bleeding which starts, continues, or gets worse.  You have a fever >101 If you are unable to open your mouth

## 2016-06-06 NOTE — ED Provider Notes (Signed)
[facility] DEPT Provider Note   CSN: [id number] Arrival date & time: 06/06/16  1118  By signing my name below, I, Charolotte Eke, attest that this documentation has been prepared under the direction and in the presence of [facility], PA-C. Electronically Signed: Charolotte Eke, Scribe. 06/06/16. 11:39 AM. History   Chief Complaint Chief Complaint  Patient presents with  . Abscess    HPI Comments: Christopher Davila is a 45 y.o. male who presents to the Emergency Department complaining of throbbing waxing and waning dental pain on bottom lower side of jaw that began 2 days ago. He reports associated swelling to the affected area. He tried warm salt water rinses with some relief. He denies fever, drainage, difficulty breathing, difficulty swallowing.    The history is provided by the patient. No language interpreter was used.    Past Medical History:  Diagnosis Date  . Diabetes mellitus   . Hypertension   . Neuropathy of lower extremity   . Pancreatitis 06/2012    Patient Active Problem List   Diagnosis Date Noted  . Essential hypertension   . Nausea   . SOB (shortness of breath)   . Hyperlipidemia   . Chest pain 02/14/2014  . Abdominal pain 12/28/2012  . Diabetes ([place]) 06/26/2012  . Hypertension 06/26/2012  . Pancreatitis 06/26/2012  . Weight loss 06/26/2012  . Oral candidiasis 06/26/2012    History reviewed. No pertinent surgical history.     Home Medications    Prior to Admission medications   Medication Sig Start Date End Date Taking? Authorizing Provider  amLODipine (NORVASC) 10 MG tablet Take 10 mg by mouth daily.    Historical Provider, MD  aspirin EC 81 MG tablet Take 1 tablet (81 mg total) by mouth daily. 05/24/15   Shanker Kristeen Mans, MD  atorvastatin (LIPITOR) 40 MG tablet Take 1 tablet (40 mg total) by mouth daily. 02/15/14   Oelwein N Rumley, DO  gabapentin (NEURONTIN) 800 MG tablet Take 800 mg by mouth 3 (three) times daily.    Historical Provider, MD    lisinopril-hydrochlorothiazide (PRINZIDE,ZESTORETIC) 20-12.5 MG per tablet Take 1 tablet by mouth daily.    Historical Provider, MD  metFORMIN (GLUCOPHAGE) 500 MG tablet Take 1 tablet (500 mg total) by mouth 2 (two) times daily with a meal. 07/27/14   Leone Brand, MD  metoprolol (LOPRESSOR) 50 MG tablet Take 50 mg by mouth 2 (two) times daily.    Historical Provider, MD  naproxen (NAPROSYN) 500 MG tablet Take 1 tablet (500 mg total) by mouth 2 (two) times daily as needed. 06/06/16   Ozella Almond Rowin Bayron, PA-C  penicillin v potassium (VEETID) 500 MG tablet Take 1 tablet (500 mg total) by mouth 4 (four) times daily. 06/06/16 06/13/16  Ozella Almond Konstantin Lehnen, PA-C  traMADol (ULTRAM) 50 MG tablet Take 1 tablet (50 mg total) by mouth every 6 (six) hours as needed. 11/28/15   Virgel Manifold, MD    Family History Family History  Problem Relation Age of Onset  . Diabetes Mother   . Lung disease Mother   . Diabetes Maternal Aunt   . CAD Maternal Aunt   . CAD Cousin     Social History Social History  Substance Use Topics  . Smoking status: Current Every Day Smoker    Packs/day: 0.00    Types: Cigarettes  . Smokeless tobacco: Never Used  . Alcohol use Yes     Comment: on the weekends per patient     Allergies   Morphine  and related   Review of Systems Review of Systems  Constitutional: Negative for fever.  HENT: Positive for dental problem and facial swelling.      Physical Exam Updated Vital Signs BP 146/100   Pulse 108   Temp 98.6 F (37 C) (Oral)   Resp 16   Ht 5\' 3"  (1.6 m)   Wt 68 kg   SpO2 94%   BMI 26.57 kg/m   Physical Exam  Constitutional: He is oriented to person, place, and time. He appears well-developed and well-nourished. No distress.  HENT:  Head: Normocephalic and atraumatic.  Dental cavities and poor oral dentition noted, pain along tooth as depicted in image with 1cm abscess present, midline uvula, no trismus, oropharynx moist and clear, no oropharyngeal  erythema or edema, neck supple and no tenderness. No facial edema.   Cardiovascular: Normal rate, regular rhythm and normal heart sounds.   No murmur heard. Pulmonary/Chest: Effort normal and breath sounds normal. No respiratory distress.  Abdominal: Soft. He exhibits no distension. There is no tenderness.  Musculoskeletal: He exhibits no edema.  Neurological: He is alert and oriented to person, place, and time.  Skin: Skin is warm and dry.  Nursing note and vitals reviewed.    ED Treatments / Results   DIAGNOSTIC STUDIES: Oxygen Saturation is 97% on room air, adequate by my interpretation.    COORDINATION OF CARE: 12:08 PM Discussed treatment plan with pt at bedside and pt agreed to plan.   Labs (all labs ordered are listed, but only abnormal results are displayed) Labs Reviewed - No data to display  EKG  EKG Interpretation None       Radiology No results found.  Procedures Procedures (including critical care time) Dental Block  Consent: Verbal consent obtained. Risks and benefits: risks, benefits and alternatives were discussed Patient identity confirmed: provided demographic data  Time out performed prior to procedure  Numbed gingiva surrounding affected molars with lidocaine gel.  Administered 5ml of marcaine w/ EPI.  Pt tolerated procedure well without complication.  INCISION AND DRAINAGE Performed by: Ozella Almond Anjenette Gerbino Consent: Verbal consent obtained. Risks and benefits: risks, benefits and alternatives were discussed Type: abscess Body area: Left lower gum Anesthesia: dental block was above Incision was made with a scalpel. Local anesthetic: as above Complexity: simple Drainage: purulent Drainage amount: small Patient tolerance: Patient tolerated the procedure well with no immediate complications.   Medications Ordered in ED Medications  penicillin v potassium (VEETID) tablet 500 mg (500 mg Oral Given 06/06/16 1256)  HYDROcodone-acetaminophen  (NORCO/VICODIN) 5-325 MG per tablet 1 tablet (1 tablet Oral Given 06/06/16 1256)     Initial Impression / Assessment and Plan / ED Course  I have reviewed the triage vital signs and the nursing notes.  Pertinent labs & imaging results that were available during my care of the patient were reviewed by me and considered in my medical decision making (see chart for details).  Clinical Course    Patient with dentalgia and area of fluctuance consistent with abscess to the left lower gum. Patient is afebrile, non toxic appearing, and swallowing secretions well. Exam not concerning for [name]'s angina or pharyngeal abscess. Dental block performed as above and abscess drained. Will treat with PenVK.  I provided dental resource guide and stressed the importance of dental follow up for ultimate management of dental pain. Patient voices understanding and is agreeable to plan.  Final Clinical Impressions(s) / ED Diagnoses   Final diagnoses:  Dental abscess    New  Prescriptions Discharge Medication List as of 06/06/2016 12:51 PM    START taking these medications   Details  naproxen (NAPROSYN) 500 MG tablet Take 1 tablet (500 mg total) by mouth 2 (two) times daily as needed., Starting Thu 06/06/2016, Print    penicillin v potassium (VEETID) 500 MG tablet Take 1 tablet (500 mg total) by mouth 4 (four) times daily., Starting Thu 06/06/2016, Until Thu 06/13/2016, Print       I personally performed the services described in this documentation, which was scribed in my presence. The recorded information has been reviewed and is accurate.     [facility] Tanaya Dunigan, PA-C 06/06/16 [place], MD 06/12/16 [phone]

## 2016-06-06 NOTE — ED Notes (Signed)
PT is in stable condition upon d/c and ambulates from ED. 

## 2017-12-22 ENCOUNTER — Emergency Department (HOSPITAL_COMMUNITY): Payer: Self-pay

## 2017-12-22 ENCOUNTER — Emergency Department (HOSPITAL_COMMUNITY)
Admission: EM | Admit: 2017-12-22 | Discharge: 2017-12-22 | Disposition: A | Discharge status: Home / Self Care | Payer: Self-pay | Attending: Emergency Medicine | Admitting: Emergency Medicine

## 2017-12-22 ENCOUNTER — Other Ambulatory Visit: Payer: Self-pay

## 2017-12-22 ENCOUNTER — Encounter (HOSPITAL_COMMUNITY): Payer: Self-pay | Admitting: *Deleted

## 2017-12-22 DIAGNOSIS — R1011 Right upper quadrant pain: Secondary | ICD-10-CM | POA: Insufficient documentation

## 2017-12-22 DIAGNOSIS — R3129 Other microscopic hematuria: Secondary | ICD-10-CM

## 2017-12-22 DIAGNOSIS — K760 Fatty (change of) liver, not elsewhere classified: Secondary | ICD-10-CM | POA: Insufficient documentation

## 2017-12-22 DIAGNOSIS — E119 Type 2 diabetes mellitus without complications: Secondary | ICD-10-CM | POA: Insufficient documentation

## 2017-12-22 DIAGNOSIS — I10 Essential (primary) hypertension: Secondary | ICD-10-CM | POA: Insufficient documentation

## 2017-12-22 DIAGNOSIS — Z79899 Other long term (current) drug therapy: Secondary | ICD-10-CM | POA: Insufficient documentation

## 2017-12-22 DIAGNOSIS — F17210 Nicotine dependence, cigarettes, uncomplicated: Secondary | ICD-10-CM | POA: Insufficient documentation

## 2017-12-22 DIAGNOSIS — R059 Cough, unspecified: Secondary | ICD-10-CM

## 2017-12-22 DIAGNOSIS — R05 Cough: Secondary | ICD-10-CM | POA: Insufficient documentation

## 2017-12-22 DIAGNOSIS — R748 Abnormal levels of other serum enzymes: Secondary | ICD-10-CM | POA: Insufficient documentation

## 2017-12-22 LAB — CBC WITH DIFFERENTIAL/PLATELET
Abs Immature Granulocytes: 0.1 10*3/uL (ref 0.0–0.1)
BASOS ABS: 0 10*3/uL (ref 0.0–0.1)
Basophils Relative: 1 %
EOS ABS: 0.2 10*3/uL (ref 0.0–0.7)
EOS PCT: 2 %
HCT: 39.5 % (ref 39.0–52.0)
HEMOGLOBIN: 13.1 g/dL (ref 13.0–17.0)
Immature Granulocytes: 1 %
LYMPHS PCT: 22 %
Lymphs Abs: 1.7 10*3/uL (ref 0.7–4.0)
MCH: 29.4 pg (ref 26.0–34.0)
MCHC: 33.2 g/dL (ref 30.0–36.0)
MCV: 88.6 fL (ref 78.0–100.0)
Monocytes Absolute: 0.8 10*3/uL (ref 0.1–1.0)
Monocytes Relative: 10 %
Neutro Abs: 5.2 10*3/uL (ref 1.7–7.7)
Neutrophils Relative %: 64 %
Platelets: 243 10*3/uL (ref 150–400)
RBC: 4.46 MIL/uL (ref 4.22–5.81)
RDW: 11.4 % — AB (ref 11.5–15.5)
WBC: 8 10*3/uL (ref 4.0–10.5)

## 2017-12-22 LAB — COMPREHENSIVE METABOLIC PANEL
ALT: 45 U/L — ABNORMAL HIGH (ref 0–44)
AST: 76 U/L — AB (ref 15–41)
Albumin: 2.5 g/dL — ABNORMAL LOW (ref 3.5–5.0)
Alkaline Phosphatase: 106 U/L (ref 38–126)
Anion gap: 9 (ref 5–15)
BILIRUBIN TOTAL: 0.4 mg/dL (ref 0.3–1.2)
BUN: 9 mg/dL (ref 6–20)
CO2: 27 mmol/L (ref 22–32)
Calcium: 8.9 mg/dL (ref 8.9–10.3)
Chloride: 98 mmol/L (ref 98–111)
Creatinine, Ser: 1.11 mg/dL (ref 0.61–1.24)
Glucose, Bld: 490 mg/dL — ABNORMAL HIGH (ref 70–99)
POTASSIUM: 3.9 mmol/L (ref 3.5–5.1)
Sodium: 134 mmol/L — ABNORMAL LOW (ref 135–145)
TOTAL PROTEIN: 6.8 g/dL (ref 6.5–8.1)

## 2017-12-22 LAB — I-STAT TROPONIN, ED
TROPONIN I, POC: 0.03 ng/mL (ref 0.00–0.08)
TROPONIN I, POC: 0.01 ng/mL (ref 0.00–0.08)

## 2017-12-22 LAB — URINALYSIS, ROUTINE W REFLEX MICROSCOPIC
BILIRUBIN URINE: NEGATIVE
Bacteria, UA: NONE SEEN
KETONES UR: NEGATIVE mg/dL
Leukocytes, UA: NEGATIVE
NITRITE: NEGATIVE
PH: 7 (ref 5.0–8.0)
Protein, ur: >=300 mg/dL — AB
Specific Gravity, Urine: 1.018 (ref 1.005–1.030)

## 2017-12-22 LAB — CBG MONITORING, ED
GLUCOSE-CAPILLARY: 490 mg/dL — AB (ref 70–99)
Glucose-Capillary: 130 mg/dL — ABNORMAL HIGH (ref 70–99)

## 2017-12-22 LAB — I-STAT CG4 LACTIC ACID, ED
Lactic Acid, Venous: 1.94 mmol/L — ABNORMAL HIGH (ref 0.5–1.9)
Lactic Acid, Venous: 2.95 mmol/L (ref 0.5–1.9)
Lactic Acid, Venous: 2.12 mmol/L (ref 0.5–1.9)
Lactic Acid, Venous: 2.17 mmol/L (ref 0.5–1.9)

## 2017-12-22 LAB — LIPASE, BLOOD: Lipase: 41 U/L (ref 11–51)

## 2017-12-22 MED ORDER — SODIUM CHLORIDE 0.9 % IV BOLUS
500.0000 mL | Freq: Once | INTRAVENOUS | Status: AC
  Administered 2017-12-22: 500 mL via INTRAVENOUS

## 2017-12-22 MED ORDER — KETOROLAC TROMETHAMINE 15 MG/ML IJ SOLN
15.0000 mg | Freq: Once | INTRAMUSCULAR | Status: AC
  Administered 2017-12-22: 15 mg via INTRAVENOUS
  Filled 2017-12-22: qty 1

## 2017-12-22 MED ORDER — SODIUM CHLORIDE 0.9 % IV BOLUS
1000.0000 mL | Freq: Once | INTRAVENOUS | Status: AC
  Administered 2017-12-22: 1000 mL via INTRAVENOUS

## 2017-12-22 MED ORDER — INSULIN ASPART 100 UNIT/ML ~~LOC~~ SOLN
10.0000 [IU] | Freq: Once | SUBCUTANEOUS | Status: AC
  Administered 2017-12-22: 10 [IU] via SUBCUTANEOUS
  Filled 2017-12-22: qty 1

## 2017-12-22 MED ORDER — GUAIFENESIN-CODEINE 100-10 MG/5ML PO SYRP: 5.0000 mL | ORAL_SOLUTION | Freq: Three times a day (TID) | ORAL | 0 refills | Status: DC | PRN

## 2017-12-22 MED ORDER — METOCLOPRAMIDE HCL 10 MG PO TABS
10.0000 mg | ORAL_TABLET | Freq: Once | ORAL | Status: AC
  Administered 2017-12-22: 10 mg via ORAL
  Filled 2017-12-22: qty 1

## 2017-12-22 MED ORDER — AMLODIPINE BESYLATE 5 MG PO TABS
10.0000 mg | ORAL_TABLET | Freq: Once | ORAL | Status: AC
  Administered 2017-12-22: 10 mg via ORAL
  Filled 2017-12-22: qty 2

## 2017-12-22 NOTE — ED Notes (Signed)
Pt verbalizes understanding of d/c instructions. Pt received prescriptions. Pt ambulatory at d/c with all belongings.  

## 2017-12-22 NOTE — ED Notes (Signed)
I-stat Lactic Acid results given to [place], [place]

## 2017-12-22 NOTE — ED Notes (Signed)
Patient transported to Ultrasound 

## 2017-12-22 NOTE — ED Triage Notes (Signed)
Pt in c/o cough and congestion for the last few days with nasal congestion, denies fever at home

## 2017-12-22 NOTE — ED Notes (Signed)
ED Provider at bedside. 

## 2017-12-22 NOTE — Discharge Instructions (Addendum)
Your blood work, chest xray and abdominal ultrasound were reassuring. No pneumonia. You do have elevated liver enzymes and fatty liver on your abdominal ultrasound. Please mention this to your regular doctor for further monitoring  Please take cough medicine as prescribed. It can make you drowsy so please do not drive or work while taking it.   You have a small amount of blood in your urine. Please follow up with your regular doctor to have this rechecked.  Take your metformin and blood pressure medication as prescribed.  Your blood pressure was elevated in the ER today, please have this rechecked by your regular doctor.  Return to the ER if you have any new or concerning symptoms like fever, trouble breathing, vomiting.

## 2017-12-22 NOTE — ED Notes (Signed)
Patient transported to X-ray 

## 2017-12-22 NOTE — ED Provider Notes (Signed)
[facility] EMERGENCY DEPARTMENT Provider Note   CSN: [id number] Arrival date & time: 12/22/17  1020     History   Chief Complaint Chief Complaint  Patient presents with  . URI    HPI Christopher Davila is a 47 y.o. male.  HPI   Christopher Davila is a 47yo male with a history of type 2 diabetes, hypertension, pancreatitis who presents to the emergency department for evaluation of URI symptoms.  Patient reports that he has had a gradually worsening cough for about a week now.  It is nonproductive.  He does report non-radiating sharp left-sided anterior chest wall pain with cough only.  Denies chest pain outside of cough.  He has had some wheezing, no prior history of asthma.  Denies tobacco use.  He also states that he has had congestion and scratchy sore throat.  He denies sick contacts with similar.  States that he feels fatigued and has generalized body aches.  He reports some epigastric soreness, worsened with cough.  States that he has been taking Alka-Seltzer, Benadryl and NyQuil for his symptoms without relief.  His blood sugar is normally between 200-300 at home, he has not checked it recently.  Has not taken any of his antihypertensives or metformin today.  Also reporting 4/10 severity dull frontal headache.  Has not taken any medication for this.  He denies fevers, chills, visual disturbance, numbness, weakness, nausea/vomiting, shortness of breath, dysuria, urinary frequency, lightheadedness, syncope.  Has not taken any Tylenol recently.  Does report drinking a beer yesterday.  Past Medical History:  Diagnosis Date  . Diabetes mellitus   . Hypertension   . Neuropathy of lower extremity   . Pancreatitis 06/2012    Patient Active Problem List   Diagnosis Date Noted  . Essential hypertension   . Nausea   . SOB (shortness of breath)   . Hyperlipidemia   . Chest pain 02/14/2014  . Abdominal pain 12/28/2012  . Diabetes ([place]) 06/26/2012  . Hypertension 06/26/2012  .  Pancreatitis 06/26/2012  . Weight loss 06/26/2012  . Oral candidiasis 06/26/2012    History reviewed. No pertinent surgical history.      Home Medications    Prior to Admission medications   Medication Sig Start Date End Date Taking? Authorizing Provider  amLODipine (NORVASC) 10 MG tablet Take 10 mg by mouth daily.    [provider]  aspirin EC 81 MG tablet Take 1 tablet (81 mg total) by mouth daily. 05/24/15   Ghimire, Henreitta Leber, MD  atorvastatin (LIPITOR) 40 MG tablet Take 1 tablet (40 mg total) by mouth daily. 02/15/14   Rumley, Marne N, DO  gabapentin (NEURONTIN) 800 MG tablet Take 800 mg by mouth 3 (three) times daily.    [provider]  lisinopril-hydrochlorothiazide (PRINZIDE,ZESTORETIC) 20-12.5 MG per tablet Take 1 tablet by mouth daily.    [provider]  metFORMIN (GLUCOPHAGE) 500 MG tablet Take 1 tablet (500 mg total) by mouth 2 (two) times daily with a meal. 07/27/14   Leone Brand, MD  metoprolol (LOPRESSOR) 50 MG tablet Take 50 mg by mouth 2 (two) times daily.    [provider]  naproxen (NAPROSYN) 500 MG tablet Take 1 tablet (500 mg total) by mouth 2 (two) times daily as needed. 06/06/16   Ward, Ozella Almond, PA-C  traMADol (ULTRAM) 50 MG tablet Take 1 tablet (50 mg total) by mouth every 6 (six) hours as needed. 11/28/15   Virgel Manifold, MD    Family  History Family History  Problem Relation Age of Onset  . Diabetes Mother   . Lung disease Mother   . Diabetes Maternal Aunt   . CAD Maternal Aunt   . CAD Cousin     Social History Social History   Tobacco Use  . Smoking status: Current Every Day Smoker    Packs/day: 0.00    Types: Cigarettes  . Smokeless tobacco: Never Used  Substance Use Topics  . Alcohol use: Yes    Comment: on the weekends per patient  . Drug use: No    Comment: per pt* smoking only two cigarettes per day, trying to quit      Allergies   Morphine and related   Review of Systems Review of  Systems  Constitutional: Negative for chills and fever.  HENT: Positive for congestion, rhinorrhea and sore throat. Negative for facial swelling and trouble swallowing.   Eyes: Negative for visual disturbance.  Respiratory: Positive for cough. Negative for shortness of breath and wheezing.   Cardiovascular: Positive for chest pain (with cough). Negative for leg swelling.  Gastrointestinal: Positive for abdominal pain (epigastric). Negative for diarrhea, nausea and vomiting.  Genitourinary: Negative for difficulty urinating, dysuria and frequency.  Musculoskeletal: Positive for myalgias. Negative for gait problem.  Skin: Negative for rash.  Neurological: Positive for headaches. Negative for syncope, weakness, light-headedness and numbness.  Psychiatric/Behavioral: Negative for agitation.     Physical Exam Updated Vital Signs BP (!) 179/116   Pulse (!) 101   Temp 98 F (36.7 C) (Oral)   Resp 17   SpO2 98%   Physical Exam  Constitutional: He is oriented to person, place, and time. He appears well-developed and well-nourished. No distress.  Non-toxic appearing, no acute distress.   HENT:  Head: Normocephalic and atraumatic.  Mucous membranes moist. Posterior oropharynx mildly erythematous. No tonsillar edema or exudate. Uvula midline. Airway patent. No maxillary or frontal sinus tenderness.   Eyes: Pupils are equal, round, and reactive to light. Conjunctivae are normal. Right eye exhibits no discharge. Left eye exhibits no discharge.  Neck: Normal range of motion. Neck supple.  Cardiovascular: Normal rate, regular rhythm and intact distal pulses.  Pulmonary/Chest: Effort normal and breath sounds normal. No stridor. No respiratory distress. He has no wheezes. He has no rales.  Anterior chest wall generally tender to palpation. No overlying rash or bruising.   Abdominal: Soft. Bowel sounds are normal.  Abdomen soft and non-distended. Mildly tender to palpation in the epigastrium and  RUQ. No guarding or rigidity. Murphy's sign negative. No CVA tenderness.   Musculoskeletal: Normal range of motion.  No leg swelling.   Neurological: He is alert and oriented to person, place, and time. Coordination normal.  Skin: Skin is warm and dry. He is not diaphoretic.  Psychiatric: He has a normal mood and affect. His behavior is normal.  Nursing note and vitals reviewed.    ED Treatments / Results  Labs (all labs ordered are listed, but only abnormal results are displayed) Labs Reviewed  COMPREHENSIVE METABOLIC PANEL - Abnormal; Notable for the following components:      Result Value   Sodium 134 (*)    Glucose, Bld 490 (*)    Albumin 2.5 (*)    AST 76 (*)    ALT 45 (*)    All other components within normal limits  CBC WITH DIFFERENTIAL/PLATELET - Abnormal; Notable for the following components:   RDW 11.4 (*)    All other components within normal limits  URINALYSIS,  ROUTINE W REFLEX MICROSCOPIC - Abnormal; Notable for the following components:   Color, Urine STRAW (*)    Glucose, UA >=500 (*)    Hgb urine dipstick MODERATE (*)    Protein, ur >=300 (*)    All other components within normal limits  CBG MONITORING, ED - Abnormal; Notable for the following components:   Glucose-Capillary 490 (*)    All other components within normal limits  I-STAT CG4 LACTIC ACID, ED - Abnormal; Notable for the following components:   Lactic Acid, Venous 1.94 (*)    All other components within normal limits  I-STAT CG4 LACTIC ACID, ED - Abnormal; Notable for the following components:   Lactic Acid, Venous 2.95 (*)    All other components within normal limits  I-STAT CG4 LACTIC ACID, ED - Abnormal; Notable for the following components:   Lactic Acid, Venous 2.12 (*)    All other components within normal limits  LIPASE, BLOOD  I-STAT TROPONIN, ED  I-STAT TROPONIN, ED  CBG MONITORING, ED    EKG None  Radiology Dg Chest 2 View  Result Date: 12/22/2017 CLINICAL DATA:  Cough and  chest congestion. EXAM: CHEST - 2 VIEW COMPARISON:  05/23/2015 FINDINGS: The heart size and mediastinal contours are within normal limits. Both lungs are clear. The visualized skeletal structures are unremarkable. IMPRESSION: Normal exam. Electronically Signed   By: Lorriane Shire M.D.   On: 12/22/2017 13:00   US Abdomen Limited Ruq  Result Date: 12/22/2017 CLINICAL DATA:  Right upper quadrant pain EXAM: ULTRASOUND ABDOMEN LIMITED RIGHT UPPER QUADRANT COMPARISON:  None. FINDINGS: Gallbladder: No gallstones or wall thickening visualized there is no pericholecystic fluid. No sonographic Murphy sign noted by sonographer. Common bile duct: Diameter: 4 mm. No intrahepatic or extrahepatic biliary duct dilatation. Liver: No focal lesion identified. Liver echogenicity is increased diffusely. Portal vein is patent on color Doppler imaging with normal direction of blood flow towards the liver. There is a slight degree of ascites. IMPRESSION: 1. Diffuse increase in liver echogenicity, a finding most likely indicative of hepatic steatosis. While no focal liver lesions are evident on this study, it must be cautioned that the sensitivity of ultrasound for detection of focal liver lesions is diminished in this circumstance. 2.  Slight ascites. 3.  Study otherwise unremarkable. Electronically Signed   By: Lowella Grip III M.D.   On: 12/22/2017 14:47    Procedures Procedures (including critical care time)  Medications Ordered in ED Medications  sodium chloride 0.9 % bolus 1,000 mL (0 mLs Intravenous Stopped 12/22/17 1400)  insulin aspart (novoLOG) injection 10 Units (10 Units Subcutaneous Given 12/22/17 1236)  ketorolac (TORADOL) 15 MG/ML injection 15 mg (15 mg Intravenous Given 12/22/17 1236)  metoCLOPramide (REGLAN) tablet 10 mg (10 mg Oral Given 12/22/17 1235)  sodium chloride 0.9 % bolus 1,000 mL (0 mLs Intravenous Stopped 12/22/17 1459)  amLODipine (NORVASC) tablet 10 mg (10 mg Oral Given 12/22/17 1458)     Initial  Impression / Assessment and Plan / ED Course  I have reviewed the triage vital signs and the nursing notes.  Pertinent labs & imaging results that were available during my care of the patient were reviewed by me and considered in my medical decision making (see chart for details).     Patient presents with cough, congestion, sore throat and headache over the past week.  He does report anterior chest wall pain with cough only, no pain outside of cough.  No fevers.  On exam he is afebrile and  nontoxic-appearing.  Hypertensive with blood pressure 182/103, he did not take his home blood pressure medication today.  Chest wall tender to palpation.  Lungs clear to auscultation.  Mild tenderness in the epigastrium and right upper quadrant.  Initial labs reviewed.  He is hyperglycemic with CBG 490, no anion gap.  No concern for acute DKA.  Fluid bolus and subcutaneous insulin given.  His lactic acid is elevated at 1.94.  Plan to recheck after fluids.  CMP reveals mild elevation in liver enzymes (AST 76, ALT 45).  Patient did drink alcohol yesterday, denies Tylenol use.  He is tender in the right upper quadrant on exam, will get right upper quadrant ultrasound for further evaluation.  CBC unremarkable, no leukocytosis.  UA without evidence of infection, does show RBCs present which appears to be chronic for him.  Lipase negative.  CXR without acute pneumonia or abnormality.  His chest pain seems musculoskeletal given only present with cough and reproducible with palpation.  istat troponin negative and ekg without ischemic change, no concern for ACS.  Right upper quadrant ultrasound with hepatic steatosis, no cholecystitis or cholelithiasis.    After fluids, CBG improved to 130.  Lactic acidosis 2.12, likely related to metformin use. No signs of dehydration, patient urinating. Patient reports improvement in headache, no belly pain on recheck.  Plan to discharge home with cough suppressant. His symptoms are  likely viral. Counseled him on reasons to return and he agrees. Also counseled him to f/u with PCP regarding blood sugar and blood pressure recheck as well as RUQ ultrasound findings showing hepatic steatosis. Discussed this patient with Dr. Rogene Houston who agrees with plan and discharge home.   Final Clinical Impressions(s) / ED Diagnoses   Final diagnoses:  RUQ pain    ED Discharge Orders        Ordered    guaiFENesin-codeine [facility] AC) 100-10 MG/5ML syrup  3 times daily PRN     12/22/17 1956       Glyn Ade, PA-C 12/25/17 1357    Fredia Sorrow, MD 12/28/17 0028

## 2018-02-27 ENCOUNTER — Encounter (HOSPITAL_COMMUNITY): Payer: Self-pay | Admitting: Emergency Medicine

## 2018-02-27 ENCOUNTER — Emergency Department (HOSPITAL_COMMUNITY)
Admission: EM | Admit: 2018-02-27 | Discharge: 2018-02-27 | Discharge status: Against Medical Advice | Payer: Self-pay | Attending: Emergency Medicine | Admitting: Emergency Medicine

## 2018-02-27 ENCOUNTER — Other Ambulatory Visit: Payer: Self-pay

## 2018-02-27 DIAGNOSIS — Z5321 Procedure and treatment not carried out due to patient leaving prior to being seen by health care provider: Secondary | ICD-10-CM | POA: Insufficient documentation

## 2018-02-27 DIAGNOSIS — R112 Nausea with vomiting, unspecified: Secondary | ICD-10-CM | POA: Insufficient documentation

## 2018-02-27 DIAGNOSIS — R109 Unspecified abdominal pain: Secondary | ICD-10-CM | POA: Insufficient documentation

## 2018-02-27 LAB — COMPREHENSIVE METABOLIC PANEL
ALT: 26 U/L (ref 0–44)
AST: 51 U/L — ABNORMAL HIGH (ref 15–41)
Albumin: 2.6 g/dL — ABNORMAL LOW (ref 3.5–5.0)
Alkaline Phosphatase: 77 U/L (ref 38–126)
Anion gap: 13 (ref 5–15)
BUN: 14 mg/dL (ref 6–20)
CHLORIDE: 90 mmol/L — AB (ref 98–111)
CO2: 27 mmol/L (ref 22–32)
Calcium: 9.1 mg/dL (ref 8.9–10.3)
Creatinine, Ser: 1.65 mg/dL — ABNORMAL HIGH (ref 0.61–1.24)
GFR, EST AFRICAN AMERICAN: 56 mL/min — AB (ref >60)
GFR, EST NON AFRICAN AMERICAN: 49 mL/min — AB (ref >60)
Glucose, Bld: 417 mg/dL — ABNORMAL HIGH (ref 70–99)
POTASSIUM: 3.5 mmol/L (ref 3.5–5.1)
Sodium: 130 mmol/L — ABNORMAL LOW (ref 135–145)
TOTAL PROTEIN: 6.2 g/dL — AB (ref 6.5–8.1)
Total Bilirubin: 0.8 mg/dL (ref 0.3–1.2)

## 2018-02-27 LAB — CBC
HEMATOCRIT: 40.7 % (ref 39.0–52.0)
Hemoglobin: 14 g/dL (ref 13.0–17.0)
MCH: 29.1 pg (ref 26.0–34.0)
MCHC: 34.4 g/dL (ref 30.0–36.0)
MCV: 84.6 fL (ref 78.0–100.0)
PLATELETS: 250 10*3/uL (ref 150–400)
RBC: 4.81 MIL/uL (ref 4.22–5.81)
RDW: 11.2 % — ABNORMAL LOW (ref 11.5–15.5)
WBC: 8 10*3/uL (ref 4.0–10.5)

## 2018-02-27 LAB — LIPASE, BLOOD: LIPASE: 38 U/L (ref 11–51)

## 2018-02-27 MED ORDER — ONDANSETRON 4 MG PO TBDP
4.0000 mg | ORAL_TABLET | Freq: Once | ORAL | Status: AC | PRN
  Administered 2018-02-27: 4 mg via ORAL
  Filled 2018-02-27: qty 1

## 2018-02-27 NOTE — ED Triage Notes (Signed)
Patient to ED c/o abdominal pain onset last night with N/V x 4 since this morning. Reports hx pancreatitis, this feels similar. Patient denies diarrhea, urinary symptoms, fevers/chills.

## 2018-02-27 NOTE — ED Notes (Signed)
No answer x2 for a room

## 2018-02-28 ENCOUNTER — Encounter (HOSPITAL_COMMUNITY): Payer: Self-pay

## 2018-02-28 ENCOUNTER — Emergency Department (HOSPITAL_COMMUNITY)
Admission: EM | Admit: 2018-02-28 | Discharge: 2018-02-28 | Disposition: A | Discharge status: Home / Self Care | Payer: Self-pay | Attending: Emergency Medicine | Admitting: Emergency Medicine

## 2018-02-28 ENCOUNTER — Other Ambulatory Visit: Payer: Self-pay

## 2018-02-28 DIAGNOSIS — F17210 Nicotine dependence, cigarettes, uncomplicated: Secondary | ICD-10-CM | POA: Insufficient documentation

## 2018-02-28 DIAGNOSIS — E1143 Type 2 diabetes mellitus with diabetic autonomic (poly)neuropathy: Secondary | ICD-10-CM

## 2018-02-28 DIAGNOSIS — R739 Hyperglycemia, unspecified: Secondary | ICD-10-CM | POA: Insufficient documentation

## 2018-02-28 DIAGNOSIS — K2900 Acute gastritis without bleeding: Secondary | ICD-10-CM | POA: Insufficient documentation

## 2018-02-28 DIAGNOSIS — K3184 Gastroparesis: Secondary | ICD-10-CM

## 2018-02-28 DIAGNOSIS — E119 Type 2 diabetes mellitus without complications: Secondary | ICD-10-CM | POA: Insufficient documentation

## 2018-02-28 DIAGNOSIS — Z7984 Long term (current) use of oral hypoglycemic drugs: Secondary | ICD-10-CM | POA: Insufficient documentation

## 2018-02-28 DIAGNOSIS — Z79899 Other long term (current) drug therapy: Secondary | ICD-10-CM | POA: Insufficient documentation

## 2018-02-28 DIAGNOSIS — I10 Essential (primary) hypertension: Secondary | ICD-10-CM | POA: Insufficient documentation

## 2018-02-28 DIAGNOSIS — Z7982 Long term (current) use of aspirin: Secondary | ICD-10-CM | POA: Insufficient documentation

## 2018-02-28 HISTORY — DX: Type 2 diabetes mellitus with diabetic autonomic (poly)neuropathy: E11.43

## 2018-02-28 HISTORY — DX: Gastroparesis: K31.84

## 2018-02-28 LAB — COMPREHENSIVE METABOLIC PANEL
ALBUMIN: 2.8 g/dL — AB (ref 3.5–5.0)
ALK PHOS: 87 U/L (ref 38–126)
ALT: 33 U/L (ref 0–44)
ANION GAP: 12 (ref 5–15)
AST: 60 U/L — ABNORMAL HIGH (ref 15–41)
BUN: 18 mg/dL (ref 6–20)
CHLORIDE: 93 mmol/L — AB (ref 98–111)
CO2: 30 mmol/L (ref 22–32)
Calcium: 9.2 mg/dL (ref 8.9–10.3)
Creatinine, Ser: 1.24 mg/dL (ref 0.61–1.24)
GFR calc non Af Amer: >60 mL/min (ref >60)
GLUCOSE: 461 mg/dL — AB (ref 70–99)
POTASSIUM: 3.7 mmol/L (ref 3.5–5.1)
SODIUM: 135 mmol/L (ref 135–145)
Total Bilirubin: 0.7 mg/dL (ref 0.3–1.2)
Total Protein: 7.1 g/dL (ref 6.5–8.1)

## 2018-02-28 LAB — URINALYSIS, ROUTINE W REFLEX MICROSCOPIC
BILIRUBIN URINE: NEGATIVE
Bacteria, UA: NONE SEEN
KETONES UR: 5 mg/dL — AB
Leukocytes, UA: NEGATIVE
NITRITE: NEGATIVE
Specific Gravity, Urine: 1.018 (ref 1.005–1.030)
pH: 7 (ref 5.0–8.0)

## 2018-02-28 LAB — CBC
HCT: 43 % (ref 39.0–52.0)
Hemoglobin: 15.2 g/dL (ref 13.0–17.0)
MCH: 30.2 pg (ref 26.0–34.0)
MCHC: 35.3 g/dL (ref 30.0–36.0)
MCV: 85.3 fL (ref 78.0–100.0)
PLATELETS: 237 10*3/uL (ref 150–400)
RBC: 5.04 MIL/uL (ref 4.22–5.81)
RDW: 11.9 % (ref 11.5–15.5)
WBC: 10.3 10*3/uL (ref 4.0–10.5)

## 2018-02-28 LAB — CBG MONITORING, ED: Glucose-Capillary: 220 mg/dL — ABNORMAL HIGH (ref 70–99)

## 2018-02-28 LAB — LIPASE, BLOOD: LIPASE: 47 U/L (ref 11–51)

## 2018-02-28 MED ORDER — PANTOPRAZOLE SODIUM 20 MG PO TBEC: 20.0000 mg | DELAYED_RELEASE_TABLET | Freq: Every day | ORAL | 0 refills | Status: DC

## 2018-02-28 MED ORDER — ONDANSETRON HCL 4 MG PO TABS: 4.0000 mg | ORAL_TABLET | Freq: Four times a day (QID) | ORAL | 0 refills | Status: DC | PRN

## 2018-02-28 MED ORDER — SODIUM CHLORIDE 0.9 % IV BOLUS
1000.0000 mL | Freq: Once | INTRAVENOUS | Status: AC
  Administered 2018-02-28: 1000 mL via INTRAVENOUS

## 2018-02-28 MED ORDER — FAMOTIDINE IN NACL 20-0.9 MG/50ML-% IV SOLN
20.0000 mg | Freq: Once | INTRAVENOUS | Status: AC
  Administered 2018-02-28: 20 mg via INTRAVENOUS
  Filled 2018-02-28: qty 50

## 2018-02-28 MED ORDER — INSULIN ASPART 100 UNIT/ML ~~LOC~~ SOLN
10.0000 [IU] | Freq: Once | SUBCUTANEOUS | Status: AC
  Administered 2018-02-28: 10 [IU] via SUBCUTANEOUS
  Filled 2018-02-28: qty 1

## 2018-02-28 MED ORDER — INSULIN ASPART 100 UNIT/ML IV SOLN
10.0000 [IU] | Freq: Once | INTRAVENOUS | Status: DC
  Filled 2018-02-28: qty 0.1

## 2018-02-28 MED ORDER — GI COCKTAIL ~~LOC~~
30.0000 mL | Freq: Once | ORAL | Status: AC
  Administered 2018-02-28: 30 mL via ORAL
  Filled 2018-02-28: qty 30

## 2018-02-28 MED ORDER — ONDANSETRON HCL 4 MG/2ML IJ SOLN
4.0000 mg | Freq: Once | INTRAMUSCULAR | Status: AC
  Administered 2018-02-28: 4 mg via INTRAVENOUS
  Filled 2018-02-28: qty 2

## 2018-02-28 NOTE — ED Provider Notes (Signed)
[facility] DEPT Provider Note   CSN: [id number] Arrival date & time: 02/28/18  1217     History   Chief Complaint Chief Complaint  Patient presents with  . Abdominal Pain    HPI Christopher Davila is a 47 y.o. male.  HPI Patient presents with epigastric abdominal pain that started 3 days ago after eating spicy food.  States he has had nausea and several episodes of vomiting.  Normal bowel movements.  No melanotic or grossly bloody stools.  Abdominal pain does not radiate.  No chest pain or shortness of breath.  No fever or chills.  Patient states he did not take his normal medication this morning.  States he has tried to take ibuprofen for the pain with no relief. Past Medical History:  Diagnosis Date  . Diabetes mellitus   . Gastroparesis due to DM ([place])   . Hypertension   . Neuropathy of lower extremity   . Pancreatitis 06/2012    Patient Active Problem List   Diagnosis Date Noted  . Gastroparesis due to DM ([place])   . Essential hypertension   . Nausea   . SOB (shortness of breath)   . Hyperlipidemia   . Chest pain 02/14/2014  . Abdominal pain 12/28/2012  . Diabetes ([place]) 06/26/2012  . Hypertension 06/26/2012  . Pancreatitis 06/26/2012  . Weight loss 06/26/2012  . Oral candidiasis 06/26/2012    No past surgical history on file.      Home Medications    Prior to Admission medications   Medication Sig Start Date End Date Taking? Authorizing Provider  amLODipine (NORVASC) 10 MG tablet Take 10 mg by mouth daily.    [provider]  aspirin EC 81 MG tablet Take 1 tablet (81 mg total) by mouth daily. Patient taking differently: Take 81 mg by mouth every 4 (four) hours as needed.  05/24/15   Ghimire, Henreitta Leber, MD  atorvastatin (LIPITOR) 40 MG tablet Take 1 tablet (40 mg total) by mouth daily. Patient not taking: Reported on 12/22/2017 02/15/14   Lorna Few, DO  gabapentin (NEURONTIN) 800 MG tablet Take 800 mg by mouth 3 (three)  times daily.    [provider]  guaiFENesin-codeine (ROBITUSSIN AC) 100-10 MG/5ML syrup Take 5 mLs by mouth 3 (three) times daily as needed for cough. 12/22/17   Glyn Ade, PA-C  lisinopril-hydrochlorothiazide (PRINZIDE,ZESTORETIC) 20-12.5 MG per tablet Take 1 tablet by mouth daily.    [provider]  metFORMIN (GLUCOPHAGE) 500 MG tablet Take 1 tablet (500 mg total) by mouth 2 (two) times daily with a meal. 07/27/14   Leone Brand, MD  metoprolol (LOPRESSOR) 50 MG tablet Take 50 mg by mouth 2 (two) times daily.    [provider]  ondansetron (ZOFRAN) 4 MG tablet Take 1 tablet (4 mg total) by mouth every 6 (six) hours as needed for nausea or vomiting. 02/28/18   Julianne Rice, MD  pantoprazole (PROTONIX) 20 MG tablet Take 1 tablet (20 mg total) by mouth daily. 02/28/18   Julianne Rice, MD  traMADol (ULTRAM) 50 MG tablet Take 1 tablet (50 mg total) by mouth every 6 (six) hours as needed. Patient not taking: Reported on 12/22/2017 11/28/15   Virgel Manifold, MD    Family History Family History  Problem Relation Age of Onset  . Diabetes Mother   . Lung disease Mother   . Diabetes Maternal Aunt   . CAD Maternal Aunt   . CAD Cousin  Social History Social History   Tobacco Use  . Smoking status: Current Every Day Smoker    Packs/day: 0.00    Types: Cigarettes  . Smokeless tobacco: Never Used  Substance Use Topics  . Alcohol use: Yes    Comment: on the weekends per patient  . Drug use: No    Comment: per pt* smoking only two cigarettes per day, trying to quit      Allergies   Morphine and related   Review of Systems Review of Systems  Constitutional: Negative for chills and fever.  HENT: Negative for sore throat and trouble swallowing.   Respiratory: Negative for cough and shortness of breath.   Cardiovascular: Negative for chest pain.  Gastrointestinal: Positive for abdominal pain, nausea and vomiting. Negative for blood in stool,  constipation and diarrhea.  Genitourinary: Negative for flank pain and hematuria.  Musculoskeletal: Negative for back pain, myalgias, neck pain and neck stiffness.  Skin: Negative for rash and wound.  Neurological: Negative for dizziness, weakness, light-headedness, numbness and headaches.  All other systems reviewed and are negative.    Physical Exam Updated Vital Signs BP (!) 184/107   Pulse 92   Temp 97.7 F (36.5 C) (Oral)   Resp 18   SpO2 100%   Physical Exam  Constitutional: He is oriented to person, place, and time. He appears well-developed and well-nourished. No distress.  HENT:  Head: Normocephalic and atraumatic.  Mouth/Throat: Oropharynx is clear and moist. No oropharyngeal exudate.  Eyes: Pupils are equal, round, and reactive to light. Conjunctivae and EOM are normal.  Neck: Normal range of motion. Neck supple. No JVD present.  Cardiovascular: Normal rate and regular rhythm. Exam reveals no gallop and no friction rub.  No murmur heard. Pulmonary/Chest: Effort normal and breath sounds normal. No stridor. No respiratory distress. He has no wheezes. He has no rales. He exhibits no tenderness.  Abdominal: Soft. Bowel sounds are normal. There is tenderness. There is no rebound and no guarding.  Epigastric and left upper quadrant tenderness to palpation.  No rebound or guarding.  Musculoskeletal: Normal range of motion. He exhibits no edema or tenderness.  No thoracic or lumbar tenderness.  No CVA tenderness.  1+ bilateral lower extremity edema.  Lymphadenopathy:    He has no cervical adenopathy.  Neurological: He is alert and oriented to person, place, and time.  Moves all extremities without focal deficit.  Sensation fully intact.  Skin: Skin is warm and dry. Capillary refill takes less than 2 seconds. No rash noted. He is not diaphoretic. No erythema.  Psychiatric: He has a normal mood and affect. His behavior is normal.  Nursing note and vitals reviewed.    ED  Treatments / Results  Labs (all labs ordered are listed, but only abnormal results are displayed) Labs Reviewed  COMPREHENSIVE METABOLIC PANEL - Abnormal; Notable for the following components:      Result Value   Chloride 93 (*)    Glucose, Bld 461 (*)    Albumin 2.8 (*)    AST 60 (*)    All other components within normal limits  URINALYSIS, ROUTINE W REFLEX MICROSCOPIC - Abnormal; Notable for the following components:   Glucose, UA >=500 (*)    Hgb urine dipstick SMALL (*)    Ketones, ur 5 (*)    Protein, ur >=300 (*)    All other components within normal limits  CBG MONITORING, ED - Abnormal; Notable for the following components:   Glucose-Capillary 220 (*)  All other components within normal limits  LIPASE, BLOOD  CBC    EKG None  Radiology No results found.  Procedures Procedures (including critical care time)  Medications Ordered in ED Medications  sodium chloride 0.9 % bolus 1,000 mL (0 mLs Intravenous Stopped 02/28/18 1724)  ondansetron (ZOFRAN) injection 4 mg (4 mg Intravenous Given 02/28/18 1636)  famotidine (PEPCID) IVPB 20 mg premix (0 mg Intravenous Stopped 02/28/18 1706)  gi cocktail (Maalox,Lidocaine,Donnatal) (30 mLs Oral Given 02/28/18 1620)  insulin aspart (novoLOG) injection 10 Units (10 Units Subcutaneous Given 02/28/18 1625)     Initial Impression / Assessment and Plan / ED Course  I have reviewed the triage vital signs and the nursing notes.  Pertinent labs & imaging results that were available during my care of the patient were reviewed by me and considered in my medical decision making (see chart for details).     Patient states his abdominal pain is significantly improved after medications.  No tenderness to palpation.  Do not believe imaging is necessary at this point.  Will start on PPI and have follow-up with gastroneurology.  Advised to avoid spicy foods as well as NSAIDs.  Return precautions given.  Final Clinical Impressions(s) / ED  Diagnoses   Final diagnoses:  Acute gastritis, presence of bleeding unspecified, unspecified gastritis type  Hyperglycemia    ED Discharge Orders         Ordered    ondansetron (ZOFRAN) 4 MG tablet  Every 6 hours PRN     02/28/18 1818    pantoprazole (PROTONIX) 20 MG tablet  Daily     02/28/18 1818           Julianne Rice, MD 02/28/18 1818

## 2018-02-28 NOTE — ED Notes (Signed)
Pt is aware we need a urine sample. 

## 2018-02-28 NOTE — ED Triage Notes (Signed)
h c/o upper abd. Discomfort. He states he has known gastroparesis d/t diabetes. He is in no distress.

## 2018-05-12 ENCOUNTER — Emergency Department (HOSPITAL_COMMUNITY)
Admission: EM | Admit: 2018-05-12 | Discharge: 2018-05-12 | Disposition: A | Discharge status: Home / Self Care | Payer: Self-pay | Attending: Emergency Medicine | Admitting: Emergency Medicine

## 2018-05-12 ENCOUNTER — Other Ambulatory Visit: Payer: Self-pay

## 2018-05-12 ENCOUNTER — Encounter (HOSPITAL_COMMUNITY): Payer: Self-pay

## 2018-05-12 ENCOUNTER — Emergency Department (HOSPITAL_COMMUNITY): Payer: Self-pay

## 2018-05-12 DIAGNOSIS — E1140 Type 2 diabetes mellitus with diabetic neuropathy, unspecified: Secondary | ICD-10-CM | POA: Insufficient documentation

## 2018-05-12 DIAGNOSIS — E785 Hyperlipidemia, unspecified: Secondary | ICD-10-CM | POA: Insufficient documentation

## 2018-05-12 DIAGNOSIS — M67441 Ganglion, right hand: Secondary | ICD-10-CM | POA: Insufficient documentation

## 2018-05-12 DIAGNOSIS — Z7984 Long term (current) use of oral hypoglycemic drugs: Secondary | ICD-10-CM | POA: Insufficient documentation

## 2018-05-12 DIAGNOSIS — I10 Essential (primary) hypertension: Secondary | ICD-10-CM | POA: Insufficient documentation

## 2018-05-12 DIAGNOSIS — F17210 Nicotine dependence, cigarettes, uncomplicated: Secondary | ICD-10-CM | POA: Insufficient documentation

## 2018-05-12 DIAGNOSIS — M79641 Pain in right hand: Secondary | ICD-10-CM

## 2018-05-12 DIAGNOSIS — Z79899 Other long term (current) drug therapy: Secondary | ICD-10-CM | POA: Insufficient documentation

## 2018-05-12 DIAGNOSIS — Z7982 Long term (current) use of aspirin: Secondary | ICD-10-CM | POA: Insufficient documentation

## 2018-05-12 MED ORDER — NAPROXEN 250 MG PO TABS
500.0000 mg | ORAL_TABLET | Freq: Once | ORAL | Status: AC
  Administered 2018-05-12: 500 mg via ORAL
  Filled 2018-05-12: qty 2

## 2018-05-12 MED ORDER — NAPROXEN 500 MG PO TABS: 500.0000 mg | ORAL_TABLET | Freq: Two times a day (BID) | ORAL | 0 refills | Status: AC

## 2018-05-12 NOTE — ED Provider Notes (Signed)
[facility] EMERGENCY DEPARTMENT Provider Note   CSN: [id number] Arrival date & time: 05/12/18  5093     History   Chief Complaint Chief Complaint  Patient presents with  . Hand Pain    HPI Christopher Davila is a 47 y.o. male.  47 y.o male with a PMH of DM. HTN presents to the ED with a chief complaint of right hand swelling x "a  Minute". Patient reports he noted swelling on his hands a couple of months ago and it has not improve.  Is tried ibuprofen but reports the size of the swelling has not reduced.  He denies any trauma or being bit by any insects.  She denies any fevers, hand weakness or tingling sensation.     Past Medical History:  Diagnosis Date  . Diabetes mellitus   . Gastroparesis due to DM ([place])   . Hypertension   . Neuropathy of lower extremity   . Pancreatitis 06/2012    Patient Active Problem List   Diagnosis Date Noted  . Gastroparesis due to DM ([place])   . Essential hypertension   . Nausea   . SOB (shortness of breath)   . Hyperlipidemia   . Chest pain 02/14/2014  . Abdominal pain 12/28/2012  . Diabetes ([place]) 06/26/2012  . Hypertension 06/26/2012  . Pancreatitis 06/26/2012  . Weight loss 06/26/2012  . Oral candidiasis 06/26/2012    History reviewed. No pertinent surgical history.      Home Medications    Prior to Admission medications   Medication Sig Start Date End Date Taking? Authorizing Provider  amLODipine (NORVASC) 10 MG tablet Take 10 mg by mouth daily.    [provider]  aspirin EC 81 MG tablet Take 1 tablet (81 mg total) by mouth daily. Patient taking differently: Take 81 mg by mouth every 4 (four) hours as needed.  05/24/15   Ghimire, Henreitta Leber, MD  atorvastatin (LIPITOR) 40 MG tablet Take 1 tablet (40 mg total) by mouth daily. Patient not taking: Reported on 12/22/2017 02/15/14   Lorna Few, DO  gabapentin (NEURONTIN) 800 MG tablet Take 800 mg by mouth 3 (three) times daily.    [provider]  guaiFENesin-codeine (ROBITUSSIN AC) 100-10 MG/5ML syrup Take 5 mLs by mouth 3 (three) times daily as needed for cough. 12/22/17   Glyn Ade, PA-C  lisinopril-hydrochlorothiazide (PRINZIDE,ZESTORETIC) 20-12.5 MG per tablet Take 1 tablet by mouth daily.    [provider]  metFORMIN (GLUCOPHAGE) 500 MG tablet Take 1 tablet (500 mg total) by mouth 2 (two) times daily with a meal. 07/27/14   Leone Brand, MD  metoprolol (LOPRESSOR) 50 MG tablet Take 50 mg by mouth 2 (two) times daily.    [provider]  naproxen (NAPROSYN) 500 MG tablet Take 1 tablet (500 mg total) by mouth 2 (two) times daily for 7 days. 05/12/18 05/19/18  Janeece Fitting, PA-C  ondansetron (ZOFRAN) 4 MG tablet Take 1 tablet (4 mg total) by mouth every 6 (six) hours as needed for nausea or vomiting. 02/28/18   Julianne Rice, MD  pantoprazole (PROTONIX) 20 MG tablet Take 1 tablet (20 mg total) by mouth daily. 02/28/18   Julianne Rice, MD  traMADol (ULTRAM) 50 MG tablet Take 1 tablet (50 mg total) by mouth every 6 (six) hours as needed. Patient not taking: Reported on 12/22/2017 11/28/15   Virgel Manifold, MD    Family History Family History  Problem Relation Age of Onset  . Diabetes Mother   .  Lung disease Mother   . Diabetes Maternal Aunt   . CAD Maternal Aunt   . CAD Cousin     Social History Social History   Tobacco Use  . Smoking status: Current Every Day Smoker    Packs/day: 0.00    Types: Cigarettes  . Smokeless tobacco: Never Used  Substance Use Topics  . Alcohol use: Yes    Comment: on the weekends per patient  . Drug use: No    Comment: per pt* smoking only two cigarettes per day, trying to quit      Allergies   Morphine and related   Review of Systems Review of Systems  Constitutional: Negative for fever.  Musculoskeletal: Positive for joint swelling.     Physical Exam Updated Vital Signs BP (!) 170/115 (BP Location: Right Arm)   Pulse (!) 108   Temp 98.6 F (37  C) (Oral)   Resp 20   Ht 5\' 3"  (1.6 m)   Wt 63.5 kg   SpO2 98%   BMI 24.80 kg/m   Physical Exam  Constitutional: He is oriented to person, place, and time. He appears well-developed and well-nourished.  HENT:  Head: Normocephalic and atraumatic.  Mouth/Throat: Oropharynx is clear and moist.  Eyes: Pupils are equal, round, and reactive to light. No scleral icterus.  Neck: Normal range of motion.  Cardiovascular: Normal heart sounds.  Pulmonary/Chest: Effort normal and breath sounds normal. He has no wheezes. He exhibits no tenderness.  Abdominal: Soft. Bowel sounds are normal. He exhibits no distension. There is no tenderness.  Musculoskeletal: He exhibits no tenderness or deformity.       Right hand: He exhibits swelling. He exhibits normal range of motion, no tenderness, no bony tenderness, normal capillary refill, no deformity and no laceration.       Hands: Neurological: He is alert and oriented to person, place, and time.  Skin: Skin is warm and dry.  Nursing note and vitals reviewed.    ED Treatments / Results  Labs (all labs ordered are listed, but only abnormal results are displayed) Labs Reviewed - No data to display  EKG None  Radiology Dg Hand 2 View Right  Result Date: 05/12/2018 CLINICAL DATA:  Palpable soft tissue abnormality on the dorsal aspect of the hand. EXAM: RIGHT HAND - 2 VIEW COMPARISON:  05/04/2014 FINDINGS: No acute bony abnormality is seen. There is some soft tissue prominence along the dorsum of the hand consistent with the given clinical history. No radiopaque foreign body or bony abnormality is noted. IMPRESSION: Soft tissue prominence consistent with the given physical history. No acute bony abnormality is seen. Electronically Signed   By: Inez Catalina M.D.   On: 05/12/2018 09:32    Procedures Procedures (including critical care time)  Medications Ordered in ED Medications  naproxen (NAPROSYN) tablet 500 mg (has no administration in time  range)     Initial Impression / Assessment and Plan / ED Course  I have reviewed the triage vital signs and the nursing notes.  Pertinent labs & imaging results that were available during my care of the patient were reviewed by me and considered in my medical decision making (see chart for details).     Presents with a ganglion cyst located on the dorsum aspect of his right hand.  He has not tried any medical therapy reports this has been present for a few months.  Evaluation the cyst is nontender, mobile, pulses are present he is neurologically intact.  DG right hand  noted tissue prominence, no fracture, dislocation or acute abnormality.  he denies any tingling or weakness to his hand.  Patient for a ganglion cyst.  he will be discharged home with naproxen along with a follow-up for orthopedics.  He understands and agrees with management.  Return precautions provided.  Final Clinical Impressions(s) / ED Diagnoses   Final diagnoses:  Hand pain, right  Ganglion cyst of tendon sheath of right hand    ED Discharge Orders         Ordered    naproxen (NAPROSYN) 500 MG tablet  2 times daily     05/12/18 1003           Janeece Fitting, PA-C 05/12/18 1008    Charlesetta Shanks, MD 05/14/18 1312

## 2018-05-12 NOTE — ED Notes (Signed)
Patient transported to X-ray 

## 2018-05-12 NOTE — Discharge Instructions (Signed)
Provided medication to help with the swelling on your hand, I have also provided an orthopedic referral please call to schedule an appointment to follow-up.

## 2018-05-12 NOTE — ED Triage Notes (Signed)
Pt reports having a lump on the back of his right hand for "a couple months"  Pt reports that yesterday it started to "ache real bad".  Pt denies any numbness to tingling to distal extremity

## 2018-06-15 ENCOUNTER — Emergency Department (HOSPITAL_COMMUNITY)
Admission: EM | Admit: 2018-06-15 | Discharge: 2018-06-15 | Disposition: A | Discharge status: Home / Self Care | Payer: Self-pay | Attending: Emergency Medicine | Admitting: Emergency Medicine

## 2018-06-15 ENCOUNTER — Encounter (HOSPITAL_COMMUNITY): Payer: Self-pay | Admitting: Emergency Medicine

## 2018-06-15 DIAGNOSIS — E119 Type 2 diabetes mellitus without complications: Secondary | ICD-10-CM | POA: Insufficient documentation

## 2018-06-15 DIAGNOSIS — E876 Hypokalemia: Secondary | ICD-10-CM | POA: Insufficient documentation

## 2018-06-15 DIAGNOSIS — Z7984 Long term (current) use of oral hypoglycemic drugs: Secondary | ICD-10-CM | POA: Insufficient documentation

## 2018-06-15 DIAGNOSIS — E860 Dehydration: Secondary | ICD-10-CM | POA: Insufficient documentation

## 2018-06-15 DIAGNOSIS — F17210 Nicotine dependence, cigarettes, uncomplicated: Secondary | ICD-10-CM | POA: Insufficient documentation

## 2018-06-15 DIAGNOSIS — R112 Nausea with vomiting, unspecified: Secondary | ICD-10-CM | POA: Insufficient documentation

## 2018-06-15 DIAGNOSIS — Z79899 Other long term (current) drug therapy: Secondary | ICD-10-CM | POA: Insufficient documentation

## 2018-06-15 DIAGNOSIS — I10 Essential (primary) hypertension: Secondary | ICD-10-CM | POA: Insufficient documentation

## 2018-06-15 DIAGNOSIS — R1084 Generalized abdominal pain: Secondary | ICD-10-CM | POA: Insufficient documentation

## 2018-06-15 LAB — CBC WITH DIFFERENTIAL/PLATELET
Abs Immature Granulocytes: 0.03 10*3/uL (ref 0.00–0.07)
BASOS PCT: 0 %
Basophils Absolute: 0 10*3/uL (ref 0.0–0.1)
EOS PCT: 1 %
Eosinophils Absolute: 0.1 10*3/uL (ref 0.0–0.5)
HCT: 40.5 % (ref 39.0–52.0)
Hemoglobin: 14 g/dL (ref 13.0–17.0)
Immature Granulocytes: 0 %
Lymphocytes Relative: 20 %
Lymphs Abs: 1.7 10*3/uL (ref 0.7–4.0)
MCH: 28.9 pg (ref 26.0–34.0)
MCHC: 34.6 g/dL (ref 30.0–36.0)
MCV: 83.5 fL (ref 80.0–100.0)
MONO ABS: 0.8 10*3/uL (ref 0.1–1.0)
Monocytes Relative: 9 %
Neutro Abs: 6.3 10*3/uL (ref 1.7–7.7)
Neutrophils Relative %: 70 %
PLATELETS: 198 10*3/uL (ref 150–400)
RBC: 4.85 MIL/uL (ref 4.22–5.81)
RDW: 11.2 % — ABNORMAL LOW (ref 11.5–15.5)
WBC: 8.9 10*3/uL (ref 4.0–10.5)
nRBC: 0 % (ref 0.0–0.2)

## 2018-06-15 LAB — URINALYSIS, ROUTINE W REFLEX MICROSCOPIC
BILIRUBIN URINE: NEGATIVE
Bacteria, UA: NONE SEEN
Ketones, ur: NEGATIVE mg/dL
Leukocytes, UA: NEGATIVE
Nitrite: NEGATIVE
Protein, ur: 100 mg/dL — AB
SPECIFIC GRAVITY, URINE: 1.02 (ref 1.005–1.030)
pH: 7 (ref 5.0–8.0)

## 2018-06-15 LAB — COMPREHENSIVE METABOLIC PANEL
ALK PHOS: 100 U/L (ref 38–126)
ALT: 19 U/L (ref 0–44)
ANION GAP: 14 (ref 5–15)
AST: 28 U/L (ref 15–41)
Albumin: 2.3 g/dL — ABNORMAL LOW (ref 3.5–5.0)
BUN: 11 mg/dL (ref 6–20)
CALCIUM: 8.3 mg/dL — AB (ref 8.9–10.3)
CHLORIDE: 92 mmol/L — AB (ref 98–111)
CO2: 25 mmol/L (ref 22–32)
CREATININE: 1.61 mg/dL — AB (ref 0.61–1.24)
GFR calc non Af Amer: 51 mL/min — ABNORMAL LOW (ref >60)
GFR, EST AFRICAN AMERICAN: 59 mL/min — AB (ref >60)
Glucose, Bld: 538 mg/dL (ref 70–99)
Potassium: 2.9 mmol/L — ABNORMAL LOW (ref 3.5–5.1)
SODIUM: 131 mmol/L — AB (ref 135–145)
Total Bilirubin: 1.1 mg/dL (ref 0.3–1.2)
Total Protein: 6.4 g/dL — ABNORMAL LOW (ref 6.5–8.1)

## 2018-06-15 LAB — I-STAT VENOUS BLOOD GAS, ED
Acid-Base Excess: 6 mmol/L — ABNORMAL HIGH (ref 0.0–2.0)
BICARBONATE: 29.8 mmol/L — AB (ref 20.0–28.0)
O2 Saturation: 97 %
PCO2 VEN: 41 mmHg — AB (ref 44.0–60.0)
PH VEN: 7.469 — AB (ref 7.250–7.430)
PO2 VEN: 87 mmHg — AB (ref 32.0–45.0)
TCO2: 31 mmol/L (ref 22–32)

## 2018-06-15 LAB — CBG MONITORING, ED
GLUCOSE-CAPILLARY: 535 mg/dL — AB (ref 70–99)
GLUCOSE-CAPILLARY: 241 mg/dL — AB (ref 70–99)
Glucose-Capillary: 412 mg/dL — ABNORMAL HIGH (ref 70–99)

## 2018-06-15 LAB — LIPASE, BLOOD: Lipase: 45 U/L (ref 11–51)

## 2018-06-15 LAB — I-STAT CG4 LACTIC ACID, ED: LACTIC ACID, VENOUS: 1.38 mmol/L (ref 0.5–1.9)

## 2018-06-15 MED ORDER — POTASSIUM CHLORIDE CRYS ER 20 MEQ PO TBCR
40.0000 meq | EXTENDED_RELEASE_TABLET | Freq: Once | ORAL | Status: AC
  Administered 2018-06-15: 40 meq via ORAL
  Filled 2018-06-15: qty 2

## 2018-06-15 MED ORDER — SODIUM CHLORIDE 0.9 % IV BOLUS
1000.0000 mL | Freq: Once | INTRAVENOUS | Status: AC
  Administered 2018-06-15: 1000 mL via INTRAVENOUS

## 2018-06-15 MED ORDER — METOPROLOL TARTRATE 25 MG PO TABS
50.0000 mg | ORAL_TABLET | Freq: Once | ORAL | Status: AC
  Administered 2018-06-15: 50 mg via ORAL
  Filled 2018-06-15: qty 2

## 2018-06-15 MED ORDER — ONDANSETRON HCL 4 MG/2ML IJ SOLN
4.0000 mg | Freq: Once | INTRAMUSCULAR | Status: AC
  Administered 2018-06-15: 4 mg via INTRAVENOUS
  Filled 2018-06-15: qty 2

## 2018-06-15 MED ORDER — POTASSIUM CHLORIDE 10 MEQ/100ML IV SOLN
10.0000 meq | INTRAVENOUS | Status: AC
  Administered 2018-06-15 (×2): 10 meq via INTRAVENOUS
  Filled 2018-06-15 (×2): qty 100

## 2018-06-15 MED ORDER — AMLODIPINE BESYLATE 5 MG PO TABS
10.0000 mg | ORAL_TABLET | Freq: Once | ORAL | Status: AC
  Administered 2018-06-15: 10 mg via ORAL
  Filled 2018-06-15: qty 2

## 2018-06-15 MED ORDER — FENTANYL CITRATE (PF) 100 MCG/2ML IJ SOLN
50.0000 ug | Freq: Once | INTRAMUSCULAR | Status: AC
  Administered 2018-06-15: 50 ug via INTRAVENOUS
  Filled 2018-06-15: qty 2

## 2018-06-15 MED ORDER — INSULIN ASPART 100 UNIT/ML ~~LOC~~ SOLN
10.0000 [IU] | Freq: Once | SUBCUTANEOUS | Status: AC
  Administered 2018-06-15: 10 [IU] via SUBCUTANEOUS

## 2018-06-15 NOTE — ED Provider Notes (Addendum)
[facility] EMERGENCY DEPARTMENT Provider Note   CSN: [id number] Arrival date & time: 06/15/18  0815     History   Chief Complaint Chief Complaint  Patient presents with  . Abdominal Pain    HPI Trueman Worlds is a 47 y.o. male.  The history is provided by the patient and medical records. No language interpreter was used.  Abdominal Pain   This is a new problem. The current episode started more than 2 days ago. The problem occurs constantly. The problem has not changed since onset.The pain is associated with an unknown factor. The pain is located in the generalized abdominal region. The quality of the pain is aching and dull. The pain is moderate. Associated symptoms include nausea, vomiting and frequency. Pertinent negatives include anorexia, fever, diarrhea, melena, constipation, dysuria, hematuria, headaches and myalgias. The symptoms are aggravated by palpation and eating. Nothing relieves the symptoms.    Past Medical History:  Diagnosis Date  . Diabetes mellitus   . Gastroparesis due to DM ([place])   . Hypertension   . Neuropathy of lower extremity   . Pancreatitis 06/2012    Patient Active Problem List   Diagnosis Date Noted  . Gastroparesis due to DM ([place])   . Essential hypertension   . Nausea   . SOB (shortness of breath)   . Hyperlipidemia   . Chest pain 02/14/2014  . Abdominal pain 12/28/2012  . Diabetes ([place]) 06/26/2012  . Hypertension 06/26/2012  . Pancreatitis 06/26/2012  . Weight loss 06/26/2012  . Oral candidiasis 06/26/2012    History reviewed. No pertinent surgical history.      Home Medications    Prior to Admission medications   Medication Sig Start Date End Date Taking? Authorizing Provider  amLODipine (NORVASC) 10 MG tablet Take 10 mg by mouth daily.    [provider]  aspirin EC 81 MG tablet Take 1 tablet (81 mg total) by mouth daily. Patient taking differently: Take 81 mg by mouth every 4 (four) hours as  needed.  05/24/15   Ghimire, Henreitta Leber, MD  atorvastatin (LIPITOR) 40 MG tablet Take 1 tablet (40 mg total) by mouth daily. Patient not taking: Reported on 12/22/2017 02/15/14   Lorna Few, DO  gabapentin (NEURONTIN) 800 MG tablet Take 800 mg by mouth 3 (three) times daily.    [provider]  guaiFENesin-codeine (ROBITUSSIN AC) 100-10 MG/5ML syrup Take 5 mLs by mouth 3 (three) times daily as needed for cough. 12/22/17   Glyn Ade, PA-C  lisinopril-hydrochlorothiazide (PRINZIDE,ZESTORETIC) 20-12.5 MG per tablet Take 1 tablet by mouth daily.    [provider]  metFORMIN (GLUCOPHAGE) 500 MG tablet Take 1 tablet (500 mg total) by mouth 2 (two) times daily with a meal. 07/27/14   Leone Brand, MD  metoprolol (LOPRESSOR) 50 MG tablet Take 50 mg by mouth 2 (two) times daily.    [provider]  ondansetron (ZOFRAN) 4 MG tablet Take 1 tablet (4 mg total) by mouth every 6 (six) hours as needed for nausea or vomiting. 02/28/18   Julianne Rice, MD  pantoprazole (PROTONIX) 20 MG tablet Take 1 tablet (20 mg total) by mouth daily. 02/28/18   Julianne Rice, MD  traMADol (ULTRAM) 50 MG tablet Take 1 tablet (50 mg total) by mouth every 6 (six) hours as needed. Patient not taking: Reported on 12/22/2017 11/28/15   Virgel Manifold, MD    Family History Family History  Problem Relation Age of Onset  . Diabetes Mother   .  Lung disease Mother   . Diabetes Maternal Aunt   . CAD Maternal Aunt   . CAD Cousin     Social History Social History   Tobacco Use  . Smoking status: Current Every Day Smoker    Packs/day: 0.00    Types: Cigarettes  . Smokeless tobacco: Never Used  Substance Use Topics  . Alcohol use: Yes    Comment: on the weekends per patient  . Drug use: No    Comment: per pt* smoking only two cigarettes per day, trying to quit      Allergies   Morphine and related   Review of Systems Review of Systems  Constitutional: Positive for chills and  fatigue. Negative for diaphoresis and fever.  HENT: Negative for congestion.   Eyes: Negative for visual disturbance.  Respiratory: Negative for cough, chest tightness, shortness of breath, wheezing and stridor.   Cardiovascular: Negative for chest pain, palpitations and leg swelling.  Gastrointestinal: Positive for abdominal pain, nausea and vomiting. Negative for anorexia, constipation, diarrhea and melena.  Endocrine: Positive for polyuria.  Genitourinary: Positive for frequency. Negative for dysuria and hematuria.  Musculoskeletal: Negative for back pain, myalgias, neck pain and neck stiffness.  Skin: Negative for rash and wound.  Neurological: Negative for dizziness, weakness, light-headedness and headaches.  Psychiatric/Behavioral: Negative for agitation.  All other systems reviewed and are negative.    Physical Exam Updated Vital Signs BP (!) 190/124 (BP Location: Left Arm)   Pulse (!) 106   Temp 98.4 F (36.9 C) (Oral)   Resp 18   SpO2 98%   Physical Exam Vitals signs and nursing note reviewed.  Constitutional:      General: He is not in acute distress.    Appearance: He is well-developed. He is not ill-appearing or diaphoretic.  HENT:     Head: Normocephalic and atraumatic.     Mouth/Throat:     Pharynx: No pharyngeal swelling or oropharyngeal exudate.  Eyes:     Extraocular Movements: Extraocular movements intact.     Conjunctiva/sclera: Conjunctivae normal.  Neck:     Musculoskeletal: Neck supple.  Cardiovascular:     Rate and Rhythm: Regular rhythm. Tachycardia present.     Heart sounds: Normal heart sounds. No murmur.  Pulmonary:     Effort: Pulmonary effort is normal. No respiratory distress.     Breath sounds: Normal breath sounds. No wheezing, rhonchi or rales.  Chest:     Chest wall: No tenderness.  Abdominal:     General: Abdomen is flat. Bowel sounds are normal. There is no distension. There are no signs of injury.     Palpations: Abdomen is soft.       Tenderness: There is generalized abdominal tenderness. There is no right CVA tenderness, left CVA tenderness or rebound.  Musculoskeletal:     Comments: Patient has normal pulses in both legs.  No numbness, tingling, or weakness of legs.  Skin:    General: Skin is warm and dry.     Capillary Refill: Capillary refill takes less than 2 seconds.  Neurological:     General: No focal deficit present.     Mental Status: He is alert.  Psychiatric:        Mood and Affect: Mood normal.      ED Treatments / Results  Labs (all labs ordered are listed, but only abnormal results are displayed) Labs Reviewed  CBC WITH DIFFERENTIAL/PLATELET - Abnormal; Notable for the following components:      Result Value  RDW 11.2 (*)    All other components within normal limits  COMPREHENSIVE METABOLIC PANEL - Abnormal; Notable for the following components:   Sodium 131 (*)    Potassium 2.9 (*)    Chloride 92 (*)    Glucose, Bld 538 (*)    Creatinine, Ser 1.61 (*)    Calcium 8.3 (*)    Total Protein 6.4 (*)    Albumin 2.3 (*)    GFR calc non Af Amer 51 (*)    GFR calc Af Amer 59 (*)    All other components within normal limits  URINALYSIS, ROUTINE W REFLEX MICROSCOPIC - Abnormal; Notable for the following components:   Color, Urine STRAW (*)    Glucose, UA >=500 (*)    Hgb urine dipstick SMALL (*)    Protein, ur 100 (*)    All other components within normal limits  CBG MONITORING, ED - Abnormal; Notable for the following components:   Glucose-Capillary 535 (*)    All other components within normal limits  I-STAT VENOUS BLOOD GAS, ED - Abnormal; Notable for the following components:   pH, Ven 7.469 (*)    pCO2, Ven 41.0 (*)    pO2, Ven 87.0 (*)    Bicarbonate 29.8 (*)    Acid-Base Excess 6.0 (*)    All other components within normal limits  CBG MONITORING, ED - Abnormal; Notable for the following components:   Glucose-Capillary 412 (*)    All other components within normal limits  CBG  MONITORING, ED - Abnormal; Notable for the following components:   Glucose-Capillary 241 (*)    All other components within normal limits  URINE CULTURE  LIPASE, BLOOD  BLOOD GAS, VENOUS  I-STAT CG4 LACTIC ACID, ED    EKG Christopher Davila  Radiology No results found.  Procedures Procedures (including critical care time) CRITICAL CARE Performed by: Gwenyth Allegra Keala Drum Total critical care time: 35 minutes Critical care time was exclusive of separately billable procedures and treating other patients. Critical care was necessary to treat or prevent imminent or life-threatening deterioration. Critical care was time spent personally by me on the following activities: development of treatment plan with patient and/or surrogate as well as nursing, discussions with consultants, evaluation of patient's response to treatment, examination of patient, obtaining history from patient or surrogate, ordering and performing treatments and interventions, ordering and review of laboratory studies, ordering and review of radiographic studies, pulse oximetry and re-evaluation of patient's condition.   Medications Ordered in ED Medications  fentaNYL (SUBLIMAZE) injection 50 mcg (50 mcg Intravenous Given 06/15/18 0912)  ondansetron (ZOFRAN) injection 4 mg (4 mg Intravenous Given 06/15/18 0912)  sodium chloride 0.9 % bolus 1,000 mL (0 mLs Intravenous Stopped 06/15/18 1014)  sodium chloride 0.9 % bolus 1,000 mL (0 mLs Intravenous Stopped 06/15/18 1112)  potassium chloride 10 mEq in 100 mL IVPB (0 mEq Intravenous Stopped 06/15/18 1215)  potassium chloride SA (K-DUR,KLOR-CON) CR tablet 40 mEq (40 mEq Oral Given 06/15/18 1006)  insulin aspart (novoLOG) injection 10 Units (10 Units Subcutaneous Given 06/15/18 1006)  metoprolol tartrate (LOPRESSOR) tablet 50 mg (50 mg Oral Given 06/15/18 1101)  amLODipine (NORVASC) tablet 10 mg (10 mg Oral Given 06/15/18 1101)     Initial Impression / Assessment and Plan / ED Course    I have reviewed the triage vital signs and the nursing notes.  Pertinent labs & imaging results that were available during my care of the patient were reviewed by me and considered in my medical decision making (  see chart for details).     Christopher Davila is a 47 y.o. male with a past medical history significant for diabetes, hypertension, prior pancreatitis, hyperlipidemia, and prior gastroparesis who presents with nausea, vomiting, abdominal discomfort, malaise, polyuria, and lightheadedness.  Patient reports that he has had the symptoms for the last week.  He reports that he is never had DKA but he says is not been taking any of his blood pressure or glucose medicines for the last few weeks.  He denies recent trauma.  He reports his abdominal pain is moderate diffusely across his abdomen.  He reports he has had several episodes of nausea and vomiting has been nonbloody nonbilious.  No constipation or diarrhea.  No dysuria but does report polyuria.  His reports his mouth is dry and he feels dehydrated.  He has never had this before.  He reports no significant congestion, rhinorrhea, cough.  No chest pain or shortness of breath.  He reports he is a decreased oral intake with the nausea and vomiting.  He does report mild chills.  He denies other complaints on arrival.  On exam, patient is tachycardic.  Abdomen is diffusely mildly tender.  No pulsatile mass was palpated in the abdomen.  Lungs were clear and chest was nontender.  No CVA or flank tenderness.  No numbness, tingling, or weakness in the legs.  Normal pulses in the legs.  Symmetric radial pulses.  Mouth and mucous membranes appear dry.  Initial blood glucose was over 500.  Clinically I am concerned about DKA.  Patient will have screening laboratory testing and urinalysis.  He will have a VBG and urine to look for DKA.  Patient will be started on normal saline.  Given description of symptoms and review of patient's chart showing no previous aortic  aneurysm have low suspicion for aortic aneurysm or dissection causing his abdominal pain in the setting of his elevated blood pressures however if work-up is completely reassuring, and your he still having severe pain or develops new symptoms in his legs, would consider CTA to rule out aortic pathology.  Patient reports she is allergic to morphine but has tolerated other IV pain medicine in the past.  He will be given fentanyl as well as nausea medicine and fluids initially.  Anticipate reassessment.  9:09 AM Initial blood gas shows possible metabolic alkalosis.  Will await further lab testing for further management.  Patient's labs do not show DKA.  Patient does show evidence of dehydration with AK I with creatinine of 1.6 from 1.0 last.  Patient has hypokalemia and low sodium and chloride feeling with the nausea and vomiting.  Patient was given oral and IV potassium.  He will be given more fluids.  He will be given subcu insulin and will also get his home blood pressure medicine to try and help his blood pressure.  Anticipate reassessment.  Patient is feeling much better, his vitals improved, and he can tolerate outpatient hydration and management, he will likely be stable for discharge home.  If he is still feeling bad after monitoring, he may require admission for dehydration with nausea and vomiting.  1:51 PM Patient's glucose continues to improve.  Glucose was 241 on reassessment after fluids and medications.  Blood pressure is also improved.  Suspect dehydration causing the patient symptoms.  No evidence of DKA.  As patient is proven he can eat and drink without difficulty and has tolerated his home medications, patient will be discharged home.  Patient says that he has  all his medications he simply did not take them.  Patient was encouraged to start taking his medications and follow-up with his primary doctor for further management of his high blood pressure and glucose abnormalities.  We did  not find evidence of acute infection at this time and we feel he is safe for discharge.  Patient understood return precautions and follow-up instructions.  Patient was discharged in good condition with improved symptoms.  Final Clinical Impressions(s) / ED Diagnoses   Final diagnoses:  Generalized abdominal pain  Non-intractable vomiting with nausea, unspecified vomiting type  Hypokalemia  Dehydration    Clinical Impression: 1. Generalized abdominal pain   2. Non-intractable vomiting with nausea, unspecified vomiting type   3. Hypokalemia   4. Dehydration     Disposition: Discharge  Condition: Good  I have discussed the results, Dx and Tx plan with the pt(& family if present). He/she/they expressed understanding and agree(s) with the plan. Discharge instructions discussed at great length. Strict return precautions discussed and pt &/or family have verbalized understanding of the instructions. No further questions at time of discharge.    New Prescriptions   No medications on file    Follow Up: Marliss Coots, NP [place] [address] [phone]     [facility] EMERGENCY DEPARTMENT [address] [id number] mc [place]       Elward Nocera, Gwenyth Allegra, MD 06/15/18 1354    Michele Kerlin, Gwenyth Allegra, MD 06/23/18 [phone]

## 2018-06-15 NOTE — Discharge Instructions (Signed)
Your work-up today showed dehydration and elevated glucose.  We also found a low potassium which we repleted.  We did not find evidence of DKA or other infection.  I suspect your history of gastroparesis contribute to the your nausea and vomiting today.  Please follow-up with your primary doctor and use her home medications to manage your blood pressure and your sugars.  If any symptoms change or worsen, please return to the nearest emergency department.

## 2018-06-15 NOTE — ED Triage Notes (Signed)
Pt here from home with c/o abd pain n/v, been seen for the same before

## 2018-06-16 LAB — URINE CULTURE: Culture: <10000 — AB

## 2018-06-18 ENCOUNTER — Emergency Department (HOSPITAL_COMMUNITY)
Admission: EM | Admit: 2018-06-18 | Discharge: 2018-06-18 | Disposition: A | Discharge status: Home / Self Care | Payer: Self-pay | Attending: Emergency Medicine | Admitting: Emergency Medicine

## 2018-06-18 ENCOUNTER — Encounter (HOSPITAL_COMMUNITY): Payer: Self-pay | Admitting: Emergency Medicine

## 2018-06-18 ENCOUNTER — Emergency Department (HOSPITAL_COMMUNITY): Payer: Self-pay

## 2018-06-18 DIAGNOSIS — R739 Hyperglycemia, unspecified: Secondary | ICD-10-CM

## 2018-06-18 DIAGNOSIS — Z7984 Long term (current) use of oral hypoglycemic drugs: Secondary | ICD-10-CM | POA: Insufficient documentation

## 2018-06-18 DIAGNOSIS — E785 Hyperlipidemia, unspecified: Secondary | ICD-10-CM | POA: Insufficient documentation

## 2018-06-18 DIAGNOSIS — K2920 Alcoholic gastritis without bleeding: Secondary | ICD-10-CM | POA: Insufficient documentation

## 2018-06-18 DIAGNOSIS — E1165 Type 2 diabetes mellitus with hyperglycemia: Secondary | ICD-10-CM | POA: Insufficient documentation

## 2018-06-18 DIAGNOSIS — F17210 Nicotine dependence, cigarettes, uncomplicated: Secondary | ICD-10-CM | POA: Insufficient documentation

## 2018-06-18 DIAGNOSIS — I10 Essential (primary) hypertension: Secondary | ICD-10-CM | POA: Insufficient documentation

## 2018-06-18 DIAGNOSIS — Z79899 Other long term (current) drug therapy: Secondary | ICD-10-CM | POA: Insufficient documentation

## 2018-06-18 LAB — CBG MONITORING, ED
GLUCOSE-CAPILLARY: 439 mg/dL — AB (ref 70–99)
Glucose-Capillary: 392 mg/dL — ABNORMAL HIGH (ref 70–99)
Glucose-Capillary: 349 mg/dL — ABNORMAL HIGH (ref 70–99)

## 2018-06-18 LAB — BASIC METABOLIC PANEL
ANION GAP: 13 (ref 5–15)
BUN: 9 mg/dL (ref 6–20)
CO2: 21 mmol/L — ABNORMAL LOW (ref 22–32)
Calcium: 7.7 mg/dL — ABNORMAL LOW (ref 8.9–10.3)
Chloride: 97 mmol/L — ABNORMAL LOW (ref 98–111)
Creatinine, Ser: 1.51 mg/dL — ABNORMAL HIGH (ref 0.61–1.24)
GFR calc Af Amer: >60 mL/min (ref >60)
GFR, EST NON AFRICAN AMERICAN: 55 mL/min — AB (ref >60)
Glucose, Bld: 468 mg/dL — ABNORMAL HIGH (ref 70–99)
Potassium: 3.4 mmol/L — ABNORMAL LOW (ref 3.5–5.1)
Sodium: 131 mmol/L — ABNORMAL LOW (ref 135–145)

## 2018-06-18 LAB — I-STAT TROPONIN, ED: TROPONIN I, POC: 0.02 ng/mL (ref 0.00–0.08)

## 2018-06-18 LAB — CBC
HCT: 35.5 % — ABNORMAL LOW (ref 39.0–52.0)
Hemoglobin: 12.4 g/dL — ABNORMAL LOW (ref 13.0–17.0)
MCH: 29.6 pg (ref 26.0–34.0)
MCHC: 34.9 g/dL (ref 30.0–36.0)
MCV: 84.7 fL (ref 80.0–100.0)
Platelets: 184 10*3/uL (ref 150–400)
RBC: 4.19 MIL/uL — ABNORMAL LOW (ref 4.22–5.81)
RDW: 11.6 % (ref 11.5–15.5)
WBC: 7.7 10*3/uL (ref 4.0–10.5)
nRBC: 0 % (ref 0.0–0.2)

## 2018-06-18 LAB — HEPATIC FUNCTION PANEL
ALT: 23 U/L (ref 0–44)
AST: 42 U/L — ABNORMAL HIGH (ref 15–41)
Albumin: 2.1 g/dL — ABNORMAL LOW (ref 3.5–5.0)
Alkaline Phosphatase: 82 U/L (ref 38–126)
Bilirubin, Direct: 0.2 mg/dL (ref 0.0–0.2)
Indirect Bilirubin: 0.5 mg/dL (ref 0.3–0.9)
TOTAL PROTEIN: 5.7 g/dL — AB (ref 6.5–8.1)
Total Bilirubin: 0.7 mg/dL (ref 0.3–1.2)

## 2018-06-18 LAB — LIPASE, BLOOD: Lipase: 47 U/L (ref 11–51)

## 2018-06-18 MED ORDER — SODIUM CHLORIDE 0.9 % IV BOLUS
1000.0000 mL | Freq: Once | INTRAVENOUS | Status: AC
  Administered 2018-06-18: 1000 mL via INTRAVENOUS

## 2018-06-18 MED ORDER — ALUM & MAG HYDROXIDE-SIMETH 200-200-20 MG/5ML PO SUSP
30.0000 mL | Freq: Once | ORAL | Status: AC
  Administered 2018-06-18: 30 mL via ORAL
  Filled 2018-06-18: qty 30

## 2018-06-18 MED ORDER — INSULIN ASPART 100 UNIT/ML ~~LOC~~ SOLN
10.0000 [IU] | Freq: Once | SUBCUTANEOUS | Status: AC
  Administered 2018-06-18: 10 [IU] via INTRAVENOUS

## 2018-06-18 MED ORDER — LIDOCAINE VISCOUS HCL 2 % MT SOLN
15.0000 mL | Freq: Once | OROMUCOSAL | Status: AC
  Administered 2018-06-18: 15 mL via ORAL
  Filled 2018-06-18: qty 15

## 2018-06-18 MED ORDER — METOCLOPRAMIDE HCL 5 MG/ML IJ SOLN
10.0000 mg | Freq: Once | INTRAMUSCULAR | Status: AC
  Administered 2018-06-18: 10 mg via INTRAVENOUS
  Filled 2018-06-18: qty 2

## 2018-06-18 NOTE — ED Provider Notes (Signed)
[facility] EMERGENCY DEPARTMENT Provider Note  CSN: [id number] Arrival date & time: 06/18/18 2458  Chief Complaint(s) Abdominal Pain and Nausea  HPI Christopher Davila is a 48 y.o. male with a history of diabetes, gastroparesis, alcohol-related pancreatitis who presents to the emergency department with 2.5 weeks of recurrent epigastric abdominal pain that radiates up to his left chest with associated nausea.  Patient was seen on December 30 for the same presentation related to alcohol consumption where he was found to have hyperglycemia without evidence of DKA.  He was treated symptomatically at that time and discharged home.  He returns today for similar symptoms, again associated with alcohol consumption for New Year's Eve.  He denies any associated fevers or chills.  Denies any primary chest pain or shortness of breath.  No diarrhea.  HPI  Past Medical History Past Medical History:  Diagnosis Date  . Diabetes mellitus   . Gastroparesis due to DM ([place])   . Hypertension   . Neuropathy of lower extremity   . Pancreatitis 06/2012   Patient Active Problem List   Diagnosis Date Noted  . Gastroparesis due to DM ([place])   . Essential hypertension   . Nausea   . SOB (shortness of breath)   . Hyperlipidemia   . Chest pain 02/14/2014  . Abdominal pain 12/28/2012  . Diabetes ([place]) 06/26/2012  . Hypertension 06/26/2012  . Pancreatitis 06/26/2012  . Weight loss 06/26/2012  . Oral candidiasis 06/26/2012   Home Medication(s) Prior to Admission medications   Medication Sig Start Date End Date Taking? Authorizing Provider  amLODipine (NORVASC) 10 MG tablet Take 10 mg by mouth daily.    [provider]  aspirin EC 81 MG tablet Take 1 tablet (81 mg total) by mouth daily. Patient taking differently: Take 81 mg by mouth every 4 (four) hours as needed.  05/24/15   Ghimire, Henreitta Leber, MD  atorvastatin (LIPITOR) 40 MG tablet Take 1 tablet (40 mg total) by mouth daily. 02/15/14    Rumley, [place] N, DO  gabapentin (NEURONTIN) 800 MG tablet Take 800 mg by mouth 3 (three) times daily.    [provider]  guaiFENesin-codeine (ROBITUSSIN AC) 100-10 MG/5ML syrup Take 5 mLs by mouth 3 (three) times daily as needed for cough. Patient not taking: Reported on 06/15/2018 12/22/17   Glyn Ade, PA-C  lisinopril-hydrochlorothiazide (PRINZIDE,ZESTORETIC) 20-12.5 MG per tablet Take 1 tablet by mouth daily.    [provider]  metFORMIN (GLUCOPHAGE) 500 MG tablet Take 1 tablet (500 mg total) by mouth 2 (two) times daily with a meal. 07/27/14   Leone Brand, MD  metoprolol (LOPRESSOR) 50 MG tablet Take 50 mg by mouth 2 (two) times daily.    [provider]  ondansetron (ZOFRAN) 4 MG tablet Take 1 tablet (4 mg total) by mouth every 6 (six) hours as needed for nausea or vomiting. 02/28/18   Julianne Rice, MD  pantoprazole (PROTONIX) 20 MG tablet Take 1 tablet (20 mg total) by mouth daily. 02/28/18   Julianne Rice, MD  traMADol (ULTRAM) 50 MG tablet Take 1 tablet (50 mg total) by mouth every 6 (six) hours as needed. Patient not taking: Reported on 12/22/2017 11/28/15   Virgel Manifold, MD  Past Surgical History History reviewed. No pertinent surgical history. Family History Family History  Problem Relation Age of Onset  . Diabetes Mother   . Lung disease Mother   . Diabetes Maternal Aunt   . CAD Maternal Aunt   . CAD Cousin     Social History Social History   Tobacco Use  . Smoking status: Current Every Day Smoker    Packs/day: 0.00    Types: Cigarettes  . Smokeless tobacco: Never Used  Substance Use Topics  . Alcohol use: Yes    Comment: on the weekends per patient  . Drug use: No    Comment: per pt* smoking only two cigarettes per day, trying to quit    Allergies Morphine and related  Review of  Systems Review of Systems All other systems are reviewed and are negative for acute change except as noted in the HPI  Physical Exam Vital Signs  I have reviewed the triage vital signs BP (!) 160/96 (BP Location: Right Arm)   Pulse (!) 108   Resp 17   Ht 5\' 3"  (1.6 m)   Wt 68 kg   SpO2 98%   BMI 26.57 kg/m   Physical Exam Vitals signs reviewed.  Constitutional:      General: He is not in acute distress.    Appearance: He is well-developed. He is not diaphoretic.  HENT:     Head: Normocephalic and atraumatic.     Nose: Nose normal.  Eyes:     General: No scleral icterus.       Right eye: No discharge.        Left eye: No discharge.     Conjunctiva/sclera: Conjunctivae normal.     Pupils: Pupils are equal, round, and reactive to light.  Neck:     Musculoskeletal: Normal range of motion and neck supple.  Cardiovascular:     Rate and Rhythm: Normal rate and regular rhythm.     Heart sounds: No murmur. No friction rub. No gallop.   Pulmonary:     Effort: Pulmonary effort is normal. No respiratory distress.     Breath sounds: Normal breath sounds. No stridor. No rales.  Abdominal:     General: There is no distension.     Palpations: Abdomen is soft.     Tenderness: There is abdominal tenderness (mild discomfort) in the epigastric area and left upper quadrant. There is no guarding or rebound.  Musculoskeletal:        General: No tenderness.  Skin:    General: Skin is warm and dry.     Findings: No erythema or rash.  Neurological:     Mental Status: He is alert and oriented to person, place, and time.     ED Results and Treatments Labs (all labs ordered are listed, but only abnormal results are displayed) Labs Reviewed  BASIC METABOLIC PANEL - Abnormal; Notable for the following components:      Result Value   Sodium 131 (*)    Potassium 3.4 (*)    Chloride 97 (*)    CO2 21 (*)    Glucose, Bld 468 (*)    Creatinine, Ser 1.51 (*)    Calcium 7.7 (*)    GFR calc  non Af Amer 55 (*)    All other components within normal limits  CBC - Abnormal; Notable for the following components:   RBC 4.19 (*)    Hemoglobin 12.4 (*)    HCT 35.5 (*)    All other components  within normal limits  HEPATIC FUNCTION PANEL - Abnormal; Notable for the following components:   Total Protein 5.7 (*)    Albumin 2.1 (*)    AST 42 (*)    All other components within normal limits  CBG MONITORING, ED - Abnormal; Notable for the following components:   Glucose-Capillary 439 (*)    All other components within normal limits  CBG MONITORING, ED - Abnormal; Notable for the following components:   Glucose-Capillary 392 (*)    All other components within normal limits  CBG MONITORING, ED - Abnormal; Notable for the following components:   Glucose-Capillary 349 (*)    All other components within normal limits  LIPASE, BLOOD  I-STAT TROPONIN, ED  CBG MONITORING, ED                                                                                                                         EKG  EKG Interpretation  Date/Time:  Thursday June 18 2018 00:50:44 EST Ventricular Rate:  107 PR Interval:    QRS Duration: 89 QT Interval:  354 QTC Calculation: 473 R Axis:   62 Text Interpretation:  Sinus tachycardia Borderline T wave abnormalities Borderline ST elevation, anterior leads Baseline wander in lead(s) II No significant change since last tracing Confirmed by Orpah Greek [phone]) on 06/18/2018 1:10:57 AM      Radiology Dg Chest 2 View  Result Date: 06/18/2018 CLINICAL DATA:  Chest pain EXAM: CHEST - 2 VIEW COMPARISON:  12/22/2017 chest radiograph. FINDINGS: Stable cardiomediastinal silhouette with normal heart size. No pneumothorax. No pleural effusion. Lungs appear clear, with no acute consolidative airspace disease and no pulmonary edema. IMPRESSION: No active cardiopulmonary disease. Electronically Signed   By: Ilona Sorrel M.D.   On: 06/18/2018 01:24   Pertinent  labs & imaging results that were available during my care of the patient were reviewed by me and considered in my medical decision making (see chart for details).  Medications Ordered in ED Medications  metoCLOPramide (REGLAN) injection 10 mg (10 mg Intravenous Given 06/18/18 0207)  sodium chloride 0.9 % bolus 1,000 mL (0 mLs Intravenous Stopped 06/18/18 0309)  alum & mag hydroxide-simeth (MAALOX/MYLANTA) 200-200-20 MG/5ML suspension 30 mL (30 mLs Oral Given 06/18/18 0206)    And  lidocaine (XYLOCAINE) 2 % viscous mouth solution 15 mL (15 mLs Oral Given 06/18/18 0206)  sodium chloride 0.9 % bolus 1,000 mL (0 mLs Intravenous Stopped 06/18/18 0439)  insulin aspart (novoLOG) injection 10 Units (10 Units Intravenous Given 06/18/18 0339)  Procedures Procedures  (including critical care time)  Medical Decision Making / ED Course I have reviewed the nursing notes for this encounter and the patient's prior records (if available in EHR or on provided paperwork).    Patient presents with epigastric abdominal pain in the setting of alcohol consumption.  Patient reported chest pain during triage but his pain is primarily abdominal pain.  His EKG revealed no acute ischemic changes or evidence of pericarditis.  Initial troponin was negative.  Given the atypical presentation, feel this is sufficient to rule out ACS.  Rest of the labs were grossly reassuring, notable for hyperglycemia without evidence of DKA.  Patient has baseline renal function.  No evidence of biliary obstruction or pancreatitis.  Patient will be treated symptomatically with IV fluids, nausea medicine and GI cocktail.  Symptomatically patient improved.  CBGs trended down.  The patient appears reasonably screened and/or stabilized for discharge and I doubt any other medical condition or other [facility] requiring further  screening, evaluation, or treatment in the ED at this time prior to discharge.  The patient is safe for discharge with strict return precautions.   Final Clinical Impression(s) / ED Diagnoses Final diagnoses:  Acute alcoholic gastritis, presence of bleeding unspecified  Hyperglycemia   Disposition: Discharge  Condition: Good  I have discussed the results, Dx and Tx plan with the patient who expressed understanding and agree(s) with the plan. Discharge instructions discussed at great length. The patient was given strict return precautions who verbalized understanding of the instructions. No further questions at time of discharge.    ED Discharge Orders    None       Follow Up: Synthia Innocent Audrea Muscat, NP [place]  [id number] [phone]  Schedule an appointment as soon as possible for a visit  For close follow up to assess for diabetes management      This chart was dictated using voice recognition software.  Despite best efforts to proofread,  errors can occur which can change the documentation meaning.   Fatima Blank, MD 06/18/18 [phone]

## 2018-06-18 NOTE — ED Triage Notes (Signed)
  Patient BIB EMS for chest pain that has been going on for about 2 1/2 weeks.  Patient states he is having L sided chest pain with arm numbness.  Patient does endorse N/V.  Patient was recently seen for GI related issues.  Was given 324 ASA, and 0.8mg  nitro via EMS.  Pain 9/10.  Patient is A&O x4.  ETOH on board.

## 2018-06-18 NOTE — ED Notes (Addendum)
CBG 439

## 2018-06-18 NOTE — ED Notes (Signed)
Pt discharged from ED; instructions provided; Pt encouraged to return to ED if symptoms worsen and to f/u with PCP; Pt verbalized understanding of all instructions 

## 2019-02-12 ENCOUNTER — Inpatient Hospital Stay (HOSPITAL_COMMUNITY): Payer: Self-pay

## 2019-02-12 ENCOUNTER — Other Ambulatory Visit: Payer: Self-pay

## 2019-02-12 ENCOUNTER — Emergency Department (HOSPITAL_COMMUNITY): Payer: Self-pay

## 2019-02-12 ENCOUNTER — Inpatient Hospital Stay (HOSPITAL_COMMUNITY)
Admission: EM | Admit: 2019-02-12 | Discharge: 2019-02-20 | DRG: 682 | Disposition: A | Discharge status: Home / Self Care | Payer: Self-pay | Attending: Family Medicine | Admitting: Family Medicine

## 2019-02-12 ENCOUNTER — Encounter (HOSPITAL_COMMUNITY): Payer: Self-pay | Admitting: Emergency Medicine

## 2019-02-12 DIAGNOSIS — Z20828 Contact with and (suspected) exposure to other viral communicable diseases: Secondary | ICD-10-CM | POA: Diagnosis present

## 2019-02-12 DIAGNOSIS — Z7289 Other problems related to lifestyle: Secondary | ICD-10-CM

## 2019-02-12 DIAGNOSIS — N183 Chronic kidney disease, stage 3 (moderate): Secondary | ICD-10-CM | POA: Diagnosis present

## 2019-02-12 DIAGNOSIS — R0600 Dyspnea, unspecified: Secondary | ICD-10-CM | POA: Diagnosis present

## 2019-02-12 DIAGNOSIS — Z789 Other specified health status: Secondary | ICD-10-CM

## 2019-02-12 DIAGNOSIS — F17210 Nicotine dependence, cigarettes, uncomplicated: Secondary | ICD-10-CM | POA: Diagnosis present

## 2019-02-12 DIAGNOSIS — N189 Chronic kidney disease, unspecified: Secondary | ICD-10-CM

## 2019-02-12 DIAGNOSIS — Z23 Encounter for immunization: Secondary | ICD-10-CM

## 2019-02-12 DIAGNOSIS — E1122 Type 2 diabetes mellitus with diabetic chronic kidney disease: Secondary | ICD-10-CM | POA: Diagnosis present

## 2019-02-12 DIAGNOSIS — R062 Wheezing: Secondary | ICD-10-CM

## 2019-02-12 DIAGNOSIS — I509 Heart failure, unspecified: Secondary | ICD-10-CM

## 2019-02-12 DIAGNOSIS — J9601 Acute respiratory failure with hypoxia: Secondary | ICD-10-CM | POA: Diagnosis present

## 2019-02-12 DIAGNOSIS — J90 Pleural effusion, not elsewhere classified: Secondary | ICD-10-CM | POA: Diagnosis present

## 2019-02-12 DIAGNOSIS — R21 Rash and other nonspecific skin eruption: Secondary | ICD-10-CM | POA: Diagnosis present

## 2019-02-12 DIAGNOSIS — I129 Hypertensive chronic kidney disease with stage 1 through stage 4 chronic kidney disease, or unspecified chronic kidney disease: Secondary | ICD-10-CM | POA: Diagnosis present

## 2019-02-12 DIAGNOSIS — Z79899 Other long term (current) drug therapy: Secondary | ICD-10-CM

## 2019-02-12 DIAGNOSIS — R1013 Epigastric pain: Secondary | ICD-10-CM

## 2019-02-12 DIAGNOSIS — F1010 Alcohol abuse, uncomplicated: Secondary | ICD-10-CM | POA: Diagnosis present

## 2019-02-12 DIAGNOSIS — R0902 Hypoxemia: Secondary | ICD-10-CM

## 2019-02-12 DIAGNOSIS — K760 Fatty (change of) liver, not elsewhere classified: Secondary | ICD-10-CM | POA: Diagnosis present

## 2019-02-12 DIAGNOSIS — R739 Hyperglycemia, unspecified: Secondary | ICD-10-CM

## 2019-02-12 DIAGNOSIS — N179 Acute kidney failure, unspecified: Principal | ICD-10-CM

## 2019-02-12 DIAGNOSIS — Z833 Family history of diabetes mellitus: Secondary | ICD-10-CM

## 2019-02-12 DIAGNOSIS — F1090 Alcohol use, unspecified, uncomplicated: Secondary | ICD-10-CM

## 2019-02-12 DIAGNOSIS — E876 Hypokalemia: Secondary | ICD-10-CM | POA: Diagnosis present

## 2019-02-12 DIAGNOSIS — Z7984 Long term (current) use of oral hypoglycemic drugs: Secondary | ICD-10-CM

## 2019-02-12 DIAGNOSIS — N184 Chronic kidney disease, stage 4 (severe): Secondary | ICD-10-CM

## 2019-02-12 DIAGNOSIS — N048 Nephrotic syndrome with other morphologic changes: Secondary | ICD-10-CM | POA: Diagnosis present

## 2019-02-12 DIAGNOSIS — E1143 Type 2 diabetes mellitus with diabetic autonomic (poly)neuropathy: Secondary | ICD-10-CM | POA: Diagnosis present

## 2019-02-12 DIAGNOSIS — R0602 Shortness of breath: Secondary | ICD-10-CM

## 2019-02-12 DIAGNOSIS — E1141 Type 2 diabetes mellitus with diabetic mononeuropathy: Secondary | ICD-10-CM | POA: Diagnosis present

## 2019-02-12 DIAGNOSIS — Z7982 Long term (current) use of aspirin: Secondary | ICD-10-CM

## 2019-02-12 DIAGNOSIS — T502X5A Adverse effect of carbonic-anhydrase inhibitors, benzothiadiazides and other diuretics, initial encounter: Secondary | ICD-10-CM | POA: Diagnosis present

## 2019-02-12 DIAGNOSIS — K3184 Gastroparesis: Secondary | ICD-10-CM | POA: Diagnosis present

## 2019-02-12 DIAGNOSIS — E785 Hyperlipidemia, unspecified: Secondary | ICD-10-CM | POA: Diagnosis present

## 2019-02-12 LAB — LIPASE, BLOOD: Lipase: 34 U/L (ref 11–51)

## 2019-02-12 LAB — CBG MONITORING, ED
Glucose-Capillary: 328 mg/dL — ABNORMAL HIGH (ref 70–99)
Glucose-Capillary: 244 mg/dL — ABNORMAL HIGH (ref 70–99)

## 2019-02-12 LAB — CBC
HCT: 38.4 % — ABNORMAL LOW (ref 39.0–52.0)
HCT: 47.3 % (ref 39.0–52.0)
Hemoglobin: 13.1 g/dL (ref 13.0–17.0)
Hemoglobin: 15.5 g/dL (ref 13.0–17.0)
MCH: 29.9 pg (ref 26.0–34.0)
MCH: 29.8 pg (ref 26.0–34.0)
MCHC: 34.1 g/dL (ref 30.0–36.0)
MCHC: 32.8 g/dL (ref 30.0–36.0)
MCV: 87.7 fL (ref 80.0–100.0)
MCV: 91 fL (ref 80.0–100.0)
Platelets: 253 10*3/uL (ref 150–400)
Platelets: 257 10*3/uL (ref 150–400)
RBC: 4.38 MIL/uL (ref 4.22–5.81)
RBC: 5.2 MIL/uL (ref 4.22–5.81)
RDW: 11.9 % (ref 11.5–15.5)
RDW: 12.3 % (ref 11.5–15.5)
WBC: 12.3 10*3/uL — ABNORMAL HIGH (ref 4.0–10.5)
WBC: 16.8 10*3/uL — ABNORMAL HIGH (ref 4.0–10.5)
nRBC: 0 % (ref 0.0–0.2)
nRBC: 0 % (ref 0.0–0.2)

## 2019-02-12 LAB — COMPREHENSIVE METABOLIC PANEL
ALT: 14 U/L (ref 0–44)
AST: 20 U/L (ref 15–41)
Albumin: 2.2 g/dL — ABNORMAL LOW (ref 3.5–5.0)
Alkaline Phosphatase: 68 U/L (ref 38–126)
Anion gap: 13 (ref 5–15)
BUN: 30 mg/dL — ABNORMAL HIGH (ref 6–20)
CO2: 20 mmol/L — ABNORMAL LOW (ref 22–32)
Calcium: 8.4 mg/dL — ABNORMAL LOW (ref 8.9–10.3)
Chloride: 100 mmol/L (ref 98–111)
Creatinine, Ser: 2.33 mg/dL — ABNORMAL HIGH (ref 0.61–1.24)
GFR calc Af Amer: 37 mL/min — ABNORMAL LOW (ref >60)
GFR calc non Af Amer: 32 mL/min — ABNORMAL LOW (ref >60)
Glucose, Bld: 446 mg/dL — ABNORMAL HIGH (ref 70–99)
Potassium: 3.2 mmol/L — ABNORMAL LOW (ref 3.5–5.1)
Sodium: 133 mmol/L — ABNORMAL LOW (ref 135–145)
Total Bilirubin: 0.8 mg/dL (ref 0.3–1.2)
Total Protein: 6.5 g/dL (ref 6.5–8.1)

## 2019-02-12 LAB — URINALYSIS, ROUTINE W REFLEX MICROSCOPIC
Bacteria, UA: NONE SEEN
Bilirubin Urine: NEGATIVE
Glucose, UA: >=500 mg/dL — AB
Ketones, ur: NEGATIVE mg/dL
Leukocytes,Ua: NEGATIVE
Nitrite: NEGATIVE
Protein, ur: >=300 mg/dL — AB
Specific Gravity, Urine: 1.017 (ref 1.005–1.030)
pH: 6 (ref 5.0–8.0)

## 2019-02-12 LAB — SARS CORONAVIRUS 2 BY RT PCR (HOSPITAL ORDER, PERFORMED IN ~~LOC~~ HOSPITAL LAB): SARS Coronavirus 2: NEGATIVE

## 2019-02-12 LAB — CREATININE, SERUM
Creatinine, Ser: 2.12 mg/dL — ABNORMAL HIGH (ref 0.61–1.24)
GFR calc Af Amer: 42 mL/min — ABNORMAL LOW (ref >60)
GFR calc non Af Amer: 36 mL/min — ABNORMAL LOW (ref >60)

## 2019-02-12 LAB — LIPID PANEL
Cholesterol: 244 mg/dL — ABNORMAL HIGH (ref 0–200)
HDL: 72 mg/dL (ref >40)
LDL Cholesterol: 136 mg/dL — ABNORMAL HIGH (ref 0–99)
Total CHOL/HDL Ratio: 3.4 RATIO
Triglycerides: 182 mg/dL — ABNORMAL HIGH (ref <150)
VLDL: 36 mg/dL (ref 0–40)

## 2019-02-12 LAB — TROPONIN I (HIGH SENSITIVITY)
Troponin I (High Sensitivity): 14 ng/L (ref <18)
Troponin I (High Sensitivity): 18 ng/L — ABNORMAL HIGH (ref <18)
Troponin I (High Sensitivity): 40 ng/L — ABNORMAL HIGH (ref <18)

## 2019-02-12 LAB — HEMOGLOBIN A1C
Hgb A1c MFr Bld: 8.3 % — ABNORMAL HIGH (ref 4.8–5.6)
Mean Plasma Glucose: 191.51 mg/dL

## 2019-02-12 LAB — BRAIN NATRIURETIC PEPTIDE: B Natriuretic Peptide: 567.4 pg/mL — ABNORMAL HIGH (ref 0.0–100.0)

## 2019-02-12 MED ORDER — AZITHROMYCIN 250 MG PO TABS
250.0000 mg | ORAL_TABLET | Freq: Every day | ORAL | Status: AC
  Administered 2019-02-13 – 2019-02-15 (×3): 250 mg via ORAL
  Filled 2019-02-12 (×3): qty 1

## 2019-02-12 MED ORDER — SODIUM CHLORIDE 0.9% FLUSH: 3.0000 mL | Freq: Once | INTRAVENOUS | Status: DC

## 2019-02-12 MED ORDER — ACETAMINOPHEN 650 MG RE SUPP: 650.0000 mg | Freq: Four times a day (QID) | RECTAL | Status: DC | PRN

## 2019-02-12 MED ORDER — LISINOPRIL 20 MG PO TABS
20.0000 mg | ORAL_TABLET | Freq: Every day | ORAL | Status: DC
  Administered 2019-02-13 – 2019-02-14 (×2): 20 mg via ORAL
  Filled 2019-02-12 (×2): qty 1

## 2019-02-12 MED ORDER — INSULIN ASPART 100 UNIT/ML ~~LOC~~ SOLN
0.0000 [IU] | Freq: Three times a day (TID) | SUBCUTANEOUS | Status: DC
  Administered 2019-02-13: 10:00:00 2 [IU] via SUBCUTANEOUS
  Administered 2019-02-13 (×2): 3 [IU] via SUBCUTANEOUS

## 2019-02-12 MED ORDER — ALBUTEROL SULFATE HFA 108 (90 BASE) MCG/ACT IN AERS: 4.0000 | INHALATION_SPRAY | RESPIRATORY_TRACT | Status: DC

## 2019-02-12 MED ORDER — ACETAMINOPHEN 325 MG PO TABS: 650.0000 mg | ORAL_TABLET | Freq: Four times a day (QID) | ORAL | Status: DC | PRN

## 2019-02-12 MED ORDER — ENOXAPARIN SODIUM 40 MG/0.4ML ~~LOC~~ SOLN
40.0000 mg | Freq: Every day | SUBCUTANEOUS | Status: DC
  Filled 2019-02-12: qty 0.4

## 2019-02-12 MED ORDER — IPRATROPIUM BROMIDE HFA 17 MCG/ACT IN AERS
2.0000 | INHALATION_SPRAY | RESPIRATORY_TRACT | Status: DC
  Filled 2019-02-12: qty 12.9

## 2019-02-12 MED ORDER — INSULIN GLARGINE 100 UNIT/ML ~~LOC~~ SOLN
5.0000 [IU] | Freq: Every day | SUBCUTANEOUS | Status: DC
  Administered 2019-02-13 – 2019-02-14 (×2): 5 [IU] via SUBCUTANEOUS
  Filled 2019-02-12 (×2): qty 0.05

## 2019-02-12 MED ORDER — SODIUM CHLORIDE 0.9 % IV BOLUS
1000.0000 mL | Freq: Once | INTRAVENOUS | Status: AC
  Administered 2019-02-12: 1000 mL via INTRAVENOUS

## 2019-02-12 MED ORDER — METOPROLOL TARTRATE 25 MG PO TABS
50.0000 mg | ORAL_TABLET | Freq: Once | ORAL | Status: AC
  Administered 2019-02-12: 16:00:00 50 mg via ORAL
  Filled 2019-02-12: qty 2

## 2019-02-12 MED ORDER — HYDROCHLOROTHIAZIDE 12.5 MG PO CAPS
12.5000 mg | ORAL_CAPSULE | Freq: Every day | ORAL | Status: DC
  Administered 2019-02-13 – 2019-02-14 (×2): 12.5 mg via ORAL
  Filled 2019-02-12 (×2): qty 1

## 2019-02-12 MED ORDER — SUCRALFATE 1 G PO TABS: 1.0000 g | ORAL_TABLET | Freq: Three times a day (TID) | ORAL | 0 refills | Status: DC

## 2019-02-12 MED ORDER — ALBUTEROL SULFATE HFA 108 (90 BASE) MCG/ACT IN AERS
4.0000 | INHALATION_SPRAY | Freq: Once | RESPIRATORY_TRACT | Status: AC
  Administered 2019-02-12: 4 via RESPIRATORY_TRACT
  Filled 2019-02-12: qty 6.7

## 2019-02-12 MED ORDER — METOPROLOL TARTRATE 50 MG PO TABS
50.0000 mg | ORAL_TABLET | Freq: Two times a day (BID) | ORAL | Status: DC
  Administered 2019-02-13 – 2019-02-19 (×13): 50 mg via ORAL
  Filled 2019-02-12 (×10): qty 1
  Filled 2019-02-12: qty 2
  Filled 2019-02-12 (×2): qty 1

## 2019-02-12 MED ORDER — PANTOPRAZOLE SODIUM 40 MG IV SOLR
40.0000 mg | Freq: Once | INTRAVENOUS | Status: AC
  Administered 2019-02-12: 40 mg via INTRAVENOUS
  Filled 2019-02-12: qty 40

## 2019-02-12 MED ORDER — AMLODIPINE BESYLATE 5 MG PO TABS
10.0000 mg | ORAL_TABLET | Freq: Once | ORAL | Status: AC
  Administered 2019-02-12: 16:00:00 10 mg via ORAL
  Filled 2019-02-12: qty 2

## 2019-02-12 MED ORDER — ATORVASTATIN CALCIUM 40 MG PO TABS
40.0000 mg | ORAL_TABLET | Freq: Every day | ORAL | Status: DC
  Administered 2019-02-13 – 2019-02-20 (×9): 40 mg via ORAL
  Filled 2019-02-12 (×9): qty 1

## 2019-02-12 MED ORDER — PREDNISONE 20 MG PO TABS
40.0000 mg | ORAL_TABLET | Freq: Every day | ORAL | Status: DC
  Administered 2019-02-13 – 2019-02-15 (×3): 40 mg via ORAL
  Filled 2019-02-12 (×3): qty 2

## 2019-02-12 MED ORDER — AMLODIPINE BESYLATE 10 MG PO TABS
10.0000 mg | ORAL_TABLET | Freq: Every day | ORAL | Status: DC
  Administered 2019-02-13 – 2019-02-15 (×4): 10 mg via ORAL
  Filled 2019-02-12 (×3): qty 1
  Filled 2019-02-12: qty 2

## 2019-02-12 MED ORDER — PANTOPRAZOLE SODIUM 20 MG PO TBEC: 20.0000 mg | DELAYED_RELEASE_TABLET | Freq: Every day | ORAL | 0 refills | Status: DC

## 2019-02-12 MED ORDER — IPRATROPIUM BROMIDE HFA 17 MCG/ACT IN AERS
2.0000 | INHALATION_SPRAY | RESPIRATORY_TRACT | Status: DC | PRN
  Administered 2019-02-14: 2 via RESPIRATORY_TRACT
  Filled 2019-02-12: qty 12.9

## 2019-02-12 MED ORDER — LIDOCAINE VISCOUS HCL 2 % MT SOLN
15.0000 mL | Freq: Once | OROMUCOSAL | Status: AC
  Administered 2019-02-12: 15 mL via ORAL
  Filled 2019-02-12: qty 15

## 2019-02-12 MED ORDER — ALUM & MAG HYDROXIDE-SIMETH 200-200-20 MG/5ML PO SUSP
30.0000 mL | Freq: Once | ORAL | Status: AC
  Administered 2019-02-12: 30 mL via ORAL
  Filled 2019-02-12: qty 30

## 2019-02-12 MED ORDER — GABAPENTIN 400 MG PO CAPS
800.0000 mg | ORAL_CAPSULE | Freq: Three times a day (TID) | ORAL | Status: DC
  Administered 2019-02-13 – 2019-02-17 (×14): 800 mg via ORAL
  Filled 2019-02-12 (×14): qty 2

## 2019-02-12 MED ORDER — LISINOPRIL-HYDROCHLOROTHIAZIDE 20-12.5 MG PO TABS: 1.0000 | ORAL_TABLET | Freq: Every day | ORAL | Status: DC

## 2019-02-12 MED ORDER — PANTOPRAZOLE SODIUM 20 MG PO TBEC
20.0000 mg | DELAYED_RELEASE_TABLET | Freq: Every day | ORAL | Status: DC
  Administered 2019-02-13: 11:00:00 20 mg via ORAL
  Filled 2019-02-12 (×2): qty 1

## 2019-02-12 MED ORDER — ALBUTEROL SULFATE HFA 108 (90 BASE) MCG/ACT IN AERS
4.0000 | INHALATION_SPRAY | RESPIRATORY_TRACT | Status: DC | PRN
  Administered 2019-02-17: 17:00:00 4 via RESPIRATORY_TRACT
  Filled 2019-02-12: qty 6.7

## 2019-02-12 NOTE — ED Notes (Signed)
Pt requested a diet Ginger ale and pt give the same

## 2019-02-12 NOTE — ED Notes (Signed)
Tech drawing labs 

## 2019-02-12 NOTE — ED Provider Notes (Addendum)
[facility] EMERGENCY DEPARTMENT Provider Note   CSN: [id number] Arrival date & time: 02/12/19  1120     History   Chief Complaint Chief Complaint  Patient presents with  . Abdominal Pain  . Epigastric Pain    HPI Christopher Davila is a 48 y.o. male.     Patient with history of chronic alcohol use, diabetes, gastroparesis, hypertension, pancreatitis --presents to the emergency department with 3-day history of epigastric pain and upper abdominal pain.  Patient reports that the pain is severe at times and causes him to have trouble walking.  It radiates up into his chest.  He does not have any associated nausea, vomiting, diarrhea, constipation.  No black or bloody stools.  Denies any fever or chills.  Denies cough but has had SOB and wheezing.  Patient states that he was seen at the [facility] and was prescribed azithromycin and prednisone which he has been taking.  No hematemesis.  He continues to drink every day.  He does take diabetes medications.  He denies heavy NSAID use.  Symptoms are not worse at any particular time of day or after eating.  Symptoms are not made worse with activity or exercise.     Past Medical History:  Diagnosis Date  . Diabetes mellitus   . Gastroparesis due to DM ([place])   . Hypertension   . Neuropathy of lower extremity   . Pancreatitis 06/2012    Patient Active Problem List   Diagnosis Date Noted  . Gastroparesis due to DM ([place])   . Essential hypertension   . Nausea   . SOB (shortness of breath)   . Hyperlipidemia   . Chest pain 02/14/2014  . Abdominal pain 12/28/2012  . Diabetes ([place]) 06/26/2012  . Hypertension 06/26/2012  . Pancreatitis 06/26/2012  . Weight loss 06/26/2012  . Oral candidiasis 06/26/2012    History reviewed. No pertinent surgical history.      Home Medications    Prior to Admission medications   Medication Sig Start Date End Date Taking? Authorizing Provider  amLODipine (NORVASC) 10 MG tablet Take 10 mg by  mouth daily.    [provider]  aspirin EC 81 MG tablet Take 1 tablet (81 mg total) by mouth daily. Patient taking differently: Take 81 mg by mouth every 4 (four) hours as needed.  05/24/15   Ghimire, Henreitta Leber, MD  atorvastatin (LIPITOR) 40 MG tablet Take 1 tablet (40 mg total) by mouth daily. 02/15/14   Rumley, [place] N, DO  gabapentin (NEURONTIN) 800 MG tablet Take 800 mg by mouth 3 (three) times daily.    [provider]  guaiFENesin-codeine (ROBITUSSIN AC) 100-10 MG/5ML syrup Take 5 mLs by mouth 3 (three) times daily as needed for cough. Patient not taking: Reported on 06/15/2018 12/22/17   Glyn Ade, PA-C  lisinopril-hydrochlorothiazide (PRINZIDE,ZESTORETIC) 20-12.5 MG per tablet Take 1 tablet by mouth daily.    [provider]  metFORMIN (GLUCOPHAGE) 500 MG tablet Take 1 tablet (500 mg total) by mouth 2 (two) times daily with a meal. 07/27/14   Leone Brand, MD  metoprolol (LOPRESSOR) 50 MG tablet Take 50 mg by mouth 2 (two) times daily.    [provider]  ondansetron (ZOFRAN) 4 MG tablet Take 1 tablet (4 mg total) by mouth every 6 (six) hours as needed for nausea or vomiting. 02/28/18   Julianne Rice, MD  pantoprazole (PROTONIX) 20 MG tablet Take 1 tablet (20 mg total) by mouth daily. 02/28/18   Lita Mains,  Shanon Brow, MD  traMADol (ULTRAM) 50 MG tablet Take 1 tablet (50 mg total) by mouth every 6 (six) hours as needed. Patient not taking: Reported on 12/22/2017 11/28/15   Virgel Manifold, MD    Family History Family History  Problem Relation Age of Onset  . Diabetes Mother   . Lung disease Mother   . Diabetes Maternal Aunt   . CAD Maternal Aunt   . CAD Cousin     Social History Social History   Tobacco Use  . Smoking status: Current Every Day Smoker    Packs/day: 0.00    Types: Cigarettes  . Smokeless tobacco: Never Used  Substance Use Topics  . Alcohol use: Yes    Comment: on the weekends per patient  . Drug use: No    Comment: per  pt* smoking only two cigarettes per day, trying to quit      Allergies   Morphine and related   Review of Systems Review of Systems  Constitutional: Negative for fever.  HENT: Negative for rhinorrhea and sore throat.   Eyes: Negative for redness.  Respiratory: Positive for shortness of breath. Negative for cough.   Cardiovascular: Positive for chest pain.  Gastrointestinal: Positive for abdominal pain. Negative for diarrhea, nausea and vomiting.  Genitourinary: Negative for dysuria.  Musculoskeletal: Negative for myalgias.  Skin: Negative for color change and rash.       Positive for abscess  Neurological: Negative for headaches.  Hematological: Negative for adenopathy.     Physical Exam Updated Vital Signs BP (!) 184/112   Pulse (!) 108   Temp 98.5 F (36.9 C) (Oral)   Resp 20   SpO2 98%   Physical Exam Vitals signs and nursing note reviewed.  Constitutional:      Appearance: He is well-developed.  HENT:     Head: Normocephalic and atraumatic.  Eyes:     General:        Right eye: No discharge.        Left eye: No discharge.     Conjunctiva/sclera: Conjunctivae normal.  Neck:     Musculoskeletal: Normal range of motion and neck supple.  Cardiovascular:     Rate and Rhythm: Regular rhythm. Tachycardia present.     Heart sounds: Normal heart sounds.  Pulmonary:     Effort: Pulmonary effort is normal.     Breath sounds: Wheezing (mild, scattered, expiratory) present.  Abdominal:     Palpations: Abdomen is soft.     Tenderness: There is abdominal tenderness in the right upper quadrant, epigastric area, periumbilical area and left upper quadrant.  Skin:    General: Skin is warm and dry.  Neurological:     Mental Status: He is alert.      ED Treatments / Results  Labs (all labs ordered are listed, but only abnormal results are displayed) Labs Reviewed  COMPREHENSIVE METABOLIC PANEL - Abnormal; Notable for the following components:      Result Value    Sodium 133 (*)    Potassium 3.2 (*)    CO2 20 (*)    Glucose, Bld 446 (*)    BUN 30 (*)    Creatinine, Ser 2.33 (*)    Calcium 8.4 (*)    Albumin 2.2 (*)    GFR calc non Af Amer 32 (*)    GFR calc Af Amer 37 (*)    All other components within normal limits  CBC - Abnormal; Notable for the following components:   WBC 12.3 (*)  HCT 38.4 (*)    All other components within normal limits  URINALYSIS, ROUTINE W REFLEX MICROSCOPIC - Abnormal; Notable for the following components:   Color, Urine STRAW (*)    Glucose, UA >=500 (*)    Hgb urine dipstick SMALL (*)    Protein, ur >=300 (*)    All other components within normal limits  CBG MONITORING, ED - Abnormal; Notable for the following components:   Glucose-Capillary 328 (*)    All other components within normal limits  TROPONIN I (HIGH SENSITIVITY) - Abnormal; Notable for the following components:   Troponin I (High Sensitivity) 18 (*)    All other components within normal limits  SARS CORONAVIRUS 2 (HOSPITAL ORDER, [facility])  LIPASE, BLOOD  TROPONIN I (HIGH SENSITIVITY)    ED ECG REPORT   Date: 02/12/2019  Rate: 114  Rhythm: sinus tachycardia  QRS Axis: normal  Intervals: normal  ST/T Wave abnormalities: nonspecific T wave changes  Conduction Disutrbances:none  Narrative Interpretation:   Old EKG Reviewed: unchanged from 06/18/2018  I have personally reviewed the EKG tracing and agree with the computerized printout as noted.  Radiology Dg Chest 2 View  Result Date: 02/12/2019 CLINICAL DATA:  Diffuse abdominal and epigastric pain for 2 days. EXAM: CHEST - 2 VIEW COMPARISON:  PA and lateral chest 06/18/2018. FINDINGS: Lungs are clear. There are very small bilateral pleural effusions. Heart size is normal. No pneumothorax. No acute or focal bony abnormality. IMPRESSION: Small bilateral pleural effusions.  Otherwise negative. Electronically Signed   By: Inge Rise M.D.   On: 02/12/2019  16:41    Procedures Procedures (including critical care time)  Medications Ordered in ED Medications  sodium chloride flush (NS) 0.9 % injection 3 mL (3 mLs Intravenous Not Given 02/12/19 1440)  albuterol (VENTOLIN HFA) 108 (90 Base) MCG/ACT inhaler 4 puff (has no administration in time range)  ipratropium (ATROVENT HFA) inhaler 2 puff (has no administration in time range)  sodium chloride 0.9 % bolus 1,000 mL (0 mLs Intravenous Stopped 02/12/19 1949)  sodium chloride 0.9 % bolus 1,000 mL (0 mLs Intravenous Stopped 02/12/19 1903)  pantoprazole (PROTONIX) injection 40 mg (40 mg Intravenous Given 02/12/19 1600)  alum & mag hydroxide-simeth (MAALOX/MYLANTA) 200-200-20 MG/5ML suspension 30 mL (30 mLs Oral Given 02/12/19 1602)    And  lidocaine (XYLOCAINE) 2 % viscous mouth solution 15 mL (15 mLs Oral Given 02/12/19 1602)  amLODipine (NORVASC) tablet 10 mg (10 mg Oral Given 02/12/19 1617)  metoprolol tartrate (LOPRESSOR) tablet 50 mg (50 mg Oral Given 02/12/19 1618)  albuterol (VENTOLIN HFA) 108 (90 Base) MCG/ACT inhaler 4 puff (4 puffs Inhalation Given 02/12/19 2024)     Initial Impression / Assessment and Plan / ED Course  I have reviewed the triage vital signs and the nursing notes.  Pertinent labs & imaging results that were available during my care of the patient were reviewed by me and considered in my medical decision making (see chart for details).        Patient seen and examined.  Patient is high risk for peptic ulcer disease or gastritis given alcohol use.  No evidence of pancreatitis on labs today.  EKG shows sinus tachycardia with some lateral T wave inversions.  Tachycardia and nonspecific T wave findings are similar to previous.  Pain does radiate up into his chest but does not have any typical features such as exertional pain, vomiting, diaphoresis.  Patient does have a bit of acute on chronic kidney  injury compared to creatinine in January of this year.  His blood sugars in the  400s.  He will need IV hydration.  Will give GI cocktail and Protonix.  Will also check troponin and chest x-ray to ensure no signs of ACS or free air.  Patient certainly does have risk factors for this, initial impression is that this is likely GI in etiology.  Vital signs reviewed and are as follows: BP (!) 184/112   Pulse (!) 108   Temp 98.5 F (36.9 C) (Oral)   Resp 20   SpO2 98%   5:56 PM patient's vital signs are improved.  Troponin normal.  He is finishing his second liter of fluids.  Blood sugar to 328.  Discussed results with patient.  Will make sure that he is on a PPI.  Discussed renal function and need to have his kidney function rechecked by his doctor next week.  Upon entering the room, patient is eating a bag of gummy bears and is in no distress.  Encouraged d/c prednisone. Home with protonix and carafate.   The patient was urged to return to the Emergency Department immediately with worsening of current symptoms, worsening abdominal pain, persistent vomiting, blood noted in stools, fever, or any other concerns. The patient verbalized understanding.    8:10 PM prior to discharge, patient was having some shortness of breath and was noted to have oxygen saturations in the 80s per the RN.  Will ambulate patient and check pulse oximetry with ambulation.   Patient continues with significant wheezing, worse than earlier after administration of albuterol.  Patient has been ambulated and was hypoxic.  Patient has multiple issues currently, shortness of breath with wheezing and hypoxia, epigastric pain, hyperglycemia, uncontrolled hypertension.  Patient is agreeable to admission at this point.  Will check COVID testing, continue breathing medications.  9:16 PM Spoke with family practice who will see.   Final Clinical Impressions(s) / ED Diagnoses   Final diagnoses:  Epigastric abdominal pain  Acute renal failure superimposed on chronic kidney disease, unspecified CKD stage,  unspecified acute renal failure type ([place])  Chronic alcohol use  Hyperglycemia  Hypoxia  Wheezing   Admit for above problems.   ED Discharge Orders         Ordered    pantoprazole (PROTONIX) 20 MG tablet  Daily     02/12/19 1800    sucralfate (CARAFATE) 1 g tablet  3 times daily with meals & bedtime     02/12/19 1800              Carlisle Cater, PA-C 02/12/19 2116    Isla Pence, MD 02/12/19 2326

## 2019-02-12 NOTE — ED Triage Notes (Signed)
Patient reports generalized abdominal pain and epigastric pain x 2 days. Denies N/V/D, fevers/chills. Last BM yesterday.

## 2019-02-12 NOTE — ED Notes (Signed)
Patient transported to X-ray 

## 2019-02-12 NOTE — ED Notes (Addendum)
Ambulated pt in the hall. Pt's O2 sat dipped into high 80's (88, 89) and pt became tachy (102, 103). Pt returned to room and PA notified of change in condition. Discharge cancelled and albuterol ordered. Pt placed on 5L O2, will continue to monitor.

## 2019-02-12 NOTE — H&P (Addendum)
[facility] Admission History and Physical Service Pager: [phone]  Patient name: Christopher Davila record number: [id number] Date of birth: 10-05-1970 Age: 48 y.o. Gender: male  Primary Care Provider: Marliss Coots, NP Consultants: None Code Status: Full  Chief Complaint: Abdominal pain  Assessment and Plan: Christopher Davila is a 48 y.o. male presenting with dominant. PMH is significant for T2 DM, HTN, reports fatty liver, HLD, EtOH use.  Shortness of breath with new O2 requirement Patient initially presented to the emergency room for abdominal pain.  Throughout his work-up patient noticed increased shortness of breath, pleuritic chest pain on inhalation, and the patient was walked down the hall and desaturated down into the 80s.  Patient also noted that he was seen by his PCP a few days ago for this shortness of breath and pleuritic chest pain.  He was started on prednisone 40 mg and azithromycin.  Patient reports no history of asthma or COPD.  Differential for this includes new onset reactive airway disease vs ACS vs PE vs CHF.He is currently on 3 L O2 [place] with O2 sats in the mid 90s..  Patient does not have any oxygen requirement at baseline.  Patient reports he has had a cough over the past few days.  Patient reports being afebrile.  In the emergency room patient received 2 albuterol treatments with some improvement.  He received a chest x-ray earlier in his admission which showed small bilateral pleural effusions but was otherwise negative.  Exam showed occasional diffuse wheeze in all lung fields.  Patient had 3+ pitting edema to the knees and lower extremities bilaterally.  Lower extremities and were not erythematous or warm to touch or painful.  Abdomen was very tense and mildly tympanic and diffusely tender.  EKG showed sinus tachycardia.  Chest x-ray showed small bilateral pleural effusions prior to fluid boluses.  Troponins were trended 14>18>40.  Per ED  provider patient's shortness of breath did worsen after receiving fluid boluses.  This is concerning for volume overload.  Overall patient appears volume overloaded with distended abdomen and lower extremity edema.  Wheezing potentially could be due to cardiac asthma with new onset CHF.  BNP elevated 567.  Given this plan to trial IV Lasix.  Plan to get echocardiogram as well.  Repeat chest x-ray stable trace pleural effusions versus atelectasis.  Patient has no other known history of CHF.  Cannot rule out pulmonary embolism either as patient is mildly tachycardic, has new shortness of breath and chest pain is pleuritic.  Wells score is 0.  Will get d-dimer, if elevated, will pursue V/Q scan and treat with heparin.  MI seems to be likely given that we had really no ST elevation on EKG.  Troponins were borderline elevated at 18.  Will keep on cardiac telemetry and repeat troponin.  If remains flat likely of ruled out ACS.  Patient's COVID was negative, but may have acquired another respiratory virus that could lead to his wheeze and shortness of breath.  We will continue his outpatient prednisone and azithromycin, and continue on duo nebs throughout the night.  Reassess respiratory status in the morning.  Patient does have elevated WBC at 16.8, however no signs of infiltrate on CXR or bronchiolar disease on initial or repeat chest x-ray. Patient's presentation could be viral, much less likely to be pneumonia. - Admit to [facility] on cardiac telemetry with Dr. Ardelia Mems as attending  - Continue to trend troponins - BNP - D-dimer - Consider cardiology  consult in the morning if labs very evidence of ACS or heart failure - Continuous telemetry -Continuous O2 monitoring - Continue albuterol and ipratropium nebulizers every 4 hours - Continue azithromycin, first dose 8/28 prior to admission.  Will receive second dose tomorrow morning -Continue prednisone 40 mg daily, first dose 8/28 prior to admission.  Will receive  second dose tomorrow morning - PT OT - Recertification labs: Lipid panel, A1c - Elevate legs - Echocardiogram -IV Lasix 40 mg x 1, will reassess in a.m. patient's respiratory status and fluid status and continue further IV diuresis -Consider cardiology consult pending echo results if new CHF is found  AKI Patient's creatinine on admission was 2.33.  Patient's baseline runs approximately 1.5.  Patient was given 2 L of normal saline in the emergency room.  Patient does endorse some decreased p.o., but overall appears more volume overloaded.  If creatinine is worse in the a.m. due to receiving fluid boluses, will then pursue diuresis, will also correlate diuresis with respiratory status especially if patient bears any evidence of fluid overload or heart failure on chest x-ray/BNP as noted above.  - Morning BMP - Holding fluids at this time given concern for fluid overload - Avoid nephrotoxic drugs   Abdominal pain Patient reports having abdominal pain that is accompanied the shortness of breath and chest pain.  This is been going on for a few days.  Reports he has not taken anything for the abdominal pain.  He also says he has had pancreatitis in the past, although lipase was within normal limits.  On exam abdomen is tense with diffuse tenderness, but without any rebound or guarding.  Patient problem list includes gastroparesis due to diabetes mellitus.  Patient denies nausea, vomiting, diarrhea, constipation.  Patient reports last bowel movement was today.  KUB was normal.  If worsens, consider getting a CT abdomen to evaluate for possible acute pancreatitis. - Protonix 20 mg daily -Monitor for peritoneal signs with frequent abdominal exams  Diabetes type 2 with neuropathy Patient's blood sugar on admission was 446.  Hemoglobin A1c is 8.3 home medication includes metformin 500 mg twice daily.CBGs since initial CMP have ranged from 244-320.  Last A1c on record was in 2016 and was 9.3.  Patient CBG  is likely elevated the setting of receiving steroids as well. - Monitor CBGs - A1c ordered - Lantus 5 units - SSIS  HTN Blood pressures since admission have ranged from 161/97-213/139. Home medications include Norvasc 10 mg, Zestoretic 20-12.5 daily, metoprolol 50 mg twice daily.  On admission patient received 2 L boluses of fluid which is most likely contributing to patient's hypertension.  He was also given his home amlodipine dose, and saw improvement in blood pressure after this. -Discontinue fluids at this time - Monitor blood pressures - Continue amlodipine - Hold HCTZ/lisinopril combo pill in setting of AKI  Reports fatty liver Admission labs showed albumin AST-20, ALT-14, lipase-34, alk phos- 68.  Albumin  HLD Home medications include Lipitor 40 mg daily, last lipid panel was in 2016. - Continue home Lipitor dose - Follow-up on lipid panel  Alcohol abuse Patient drinks anywhere from 2 beers to 12 pack on a given day.  Also has a history of alcoholic pancreatitis, and alcoholic gastritis and has been seen in ED for this multiple times.  Reports never being admitted to the hospital for alcohol withdrawal.  Does not display any symptoms of active withdrawal at this time. -Monitor CIWA scores without Ativan, can add Ativan if display symptoms  of withdrawal  FEN/GI: Carb modified Prophylaxis: Lovenox 40 mg daily  Disposition: Pending further evaluation  History of Present Illness:  Christopher Davila is a 48 y.o. male presenting with abdominal pain.  Patient initially presented because of increased abdominal pain and chest pain over the past several days.  He is also noticed some mild shortness of breath which increased today.  Patient was seen by his PCP yesterday and given prednisone and an antibiotic azithromycin for 5 days.  He said prior to this admission he had not required oxygen at home.  Patient reports his first day of his medications was yesterday afternoon.  His abdominal  pain was worked up in the emergency room, he was given Protonix.  On initial presentation patient's WBC was 12.3, his glucose was 446, creatinine- 2.33 with a baseline around 1.5, lipase-34, ALT T-14, AST-20, UA showed greater than 500 glucose, hyaline casts were present.  Troponins were trended 14>18.  Patient was preparing to be discharged when he noticed his shortness of breath that increased.  Nursing staff walked him through the halls and is O2 saturation dropped down into the 80s.  He was put on 3 L O2 nasal cannula and his saturations increased back to the high 90s.  He was ambulated again and his O2 sats dropped again.  Patient denies this happening in the past.  Patient reports no oxygen requirement at home.  Patient endorses decreased p.o. intake, no sick contacts at home, he has not been checking his blood sugars because his glucometer battery is dead.  Patient reports drinking 2-3 beers daily but he has not had a drink since Monday.  He says that Monday he did drink a 12 pack.  Reports that he will occasionally drink an entire 12 pack in 1 night but that is a rarity.  Patient reports he quit smoking 7 years ago and has a 5-pack-year history.  Denies use of any illicit substances.   Review Of Systems: Per HPI with the following additions:   Review of Systems  Constitutional: Negative for fever.  HENT: Negative for congestion and hearing loss.   Eyes: Negative for blurred vision and double vision.  Respiratory: Positive for cough, sputum production and shortness of breath.   Cardiovascular: Positive for chest pain and leg swelling.  Gastrointestinal: Positive for abdominal pain. Negative for constipation, diarrhea, nausea and vomiting.  Genitourinary: Positive for frequency and urgency.  Neurological: Positive for weakness. Negative for headaches.  Endo/Heme/Allergies: Positive for polydipsia.    Patient Active Problem List   Diagnosis Date Noted  . Gastroparesis due to DM ([place])   .  Essential hypertension   . Nausea   . SOB (shortness of breath)   . Hyperlipidemia   . Chest pain 02/14/2014  . Abdominal pain 12/28/2012  . Diabetes ([place]) 06/26/2012  . Hypertension 06/26/2012  . Pancreatitis 06/26/2012  . Weight loss 06/26/2012  . Oral candidiasis 06/26/2012    Past Medical History: Past Medical History:  Diagnosis Date  . Diabetes mellitus   . Gastroparesis due to DM ([place])   . Hypertension   . Neuropathy of lower extremity   . Pancreatitis 06/2012    Past Surgical History: History reviewed. No pertinent surgical history.  Social History: Social History   Tobacco Use  . Smoking status: Current Every Day Smoker    Packs/day: 0.00    Types: Cigarettes  . Smokeless tobacco: Never Used  Substance Use Topics  . Alcohol use: Yes    Comment: on the weekends  per patient  . Drug use: No    Comment: per pt* smoking only two cigarettes per day, trying to quit    Additional social history: drinks 2 beers to a 12 pack a day, quit smoking 7 years agos, 5 pack year history, denies drugs,  Please also refer to relevant sections of EMR.  Family History: Family History  Problem Relation Age of Onset  . Diabetes Mother   . Lung disease Mother   . Diabetes Maternal Aunt   . CAD Maternal Aunt   . CAD Cousin      Allergies and Medications: Allergies  Allergen Reactions  . Morphine And Related Itching and Other (See Comments)    Pt prefers not to be given this drug   No current facility-administered medications on file prior to encounter.    Current Outpatient Medications on File Prior to Encounter  Medication Sig Dispense Refill  . amLODipine (NORVASC) 10 MG tablet Take 10 mg by mouth daily.    Marland Kitchen aspirin EC 81 MG tablet Take 1 tablet (81 mg total) by mouth daily.    Marland Kitchen atorvastatin (LIPITOR) 40 MG tablet Take 1 tablet (40 mg total) by mouth daily. 90 tablet 0  . azithromycin (ZITHROMAX) 250 MG tablet Take 250-500 mg by mouth daily. Take 2 tablets on day  1 then 1 tablet daily for days  2-5    . gabapentin (NEURONTIN) 800 MG tablet Take 800 mg by mouth 3 (three) times daily.    Marland Kitchen lisinopril-hydrochlorothiazide (PRINZIDE,ZESTORETIC) 20-12.5 MG per tablet Take 1 tablet by mouth daily.    . metFORMIN (GLUCOPHAGE) 500 MG tablet Take 1 tablet (500 mg total) by mouth 2 (two) times daily with a meal. 60 tablet 0  . metoprolol (LOPRESSOR) 50 MG tablet Take 50 mg by mouth 2 (two) times daily.    . traMADol (ULTRAM) 50 MG tablet Take 1 tablet (50 mg total) by mouth every 6 (six) hours as needed. (Patient not taking: Reported on 12/22/2017) 8 tablet 0    Objective: BP (!) 173/116   Pulse (!) 102   Temp 98.5 F (36.9 C) (Oral)   Resp (!) 30   SpO2 97%  Physical Exam  Constitutional: He is well-developed, well-nourished, and in no distress. No distress.  HENT:  Head: Atraumatic.  Eyes: Pupils are equal, round, and reactive to light. EOM are normal.  Neck: Normal range of motion.  Cardiovascular: Intact distal pulses. Exam reveals no gallop and no friction rub.  No murmur heard. Tachycardic in the low 100s.  Regular rhythm,   Pulmonary/Chest: Effort normal. He has wheezes (Diffuse occasional wheezes in all lung fields). He has no rales.  Patient on 3 L O2 nasal cannula satting between 95-100%  Musculoskeletal:        General: Edema (+3 edema and lower extremities bilaterally to the knees.) present.  Neurological: He is alert. No cranial nerve deficit.  Skin: Skin is warm and dry. He is not diaphoretic.    Labs and Imaging: CBC BMET  Recent Labs  Lab 02/12/19 1154  WBC 12.3*  HGB 13.1  HCT 38.4*  PLT 253   Recent Labs  Lab 02/12/19 1154  NA 133*  K 3.2*  CL 100  CO2 20*  BUN 30*  CREATININE 2.33*  GLUCOSE 446*  CALCIUM 8.4*     Results for Christopher, Davila (MRN [id number]) as of 02/12/2019 22:51  Ref. Range 02/12/2019 14:19  URINALYSIS, ROUTINE W REFLEX MICROSCOPIC Unknown Rpt (A)  Appearance Latest Ref Range:  CLEAR  CLEAR   Bilirubin Urine Latest Ref Range: NEGATIVE  NEGATIVE  Color, Urine Latest Ref Range: YELLOW  STRAW (A)  Glucose, UA Latest Ref Range: NEGATIVE mg/dL >=500 (A)  Hgb urine dipstick Latest Ref Range: NEGATIVE  SMALL (A)  Ketones, ur Latest Ref Range: NEGATIVE mg/dL NEGATIVE  Leukocytes,Ua Latest Ref Range: NEGATIVE  NEGATIVE  Nitrite Latest Ref Range: NEGATIVE  NEGATIVE  pH Latest Ref Range: 5.0 - 8.0  6.0  Protein Latest Ref Range: NEGATIVE mg/dL >=300 (A)  Specific Gravity, Urine Latest Ref Range: 1.005 - 1.030  1.017   Glucose- 446> 320> 244 EKG: Sinus tachycardia Troponin-14> 18  Dg Chest 2 View  Result Date: 02/12/2019 CLINICAL DATA:  Diffuse abdominal and epigastric pain for 2 days. EXAM: CHEST - 2 VIEW COMPARISON:  PA and lateral chest 06/18/2018. FINDINGS: Lungs are clear. There are very small bilateral pleural effusions. Heart size is normal. No pneumothorax. No acute or focal bony abnormality. IMPRESSION: Small bilateral pleural effusions.  Otherwise negative. Electronically Signed   By: Inge Rise M.D.   On: 02/12/2019 16:41     Gifford Shave, MD 02/12/2019, 9:01 PM PGY-1, [place] Intern pager: [phone], text pages welcome  [place]   I have seen and examined this patient.    I have discussed the findings and exam with the intern and agree with the above note, which I have edited appropriately in [place]. I helped develop the management plan that is described in the resident's note, and I agree with the content.   Marny Lowenstein, MD, MS FAMILY MEDICINE RESIDENT - PGY3 02/12/2019 11:34 PM

## 2019-02-12 NOTE — ED Notes (Signed)
ED TO INPATIENT HANDOFF REPORT  ED Nurse Name and Phone #: Cyd Silence, [phone]  S Name/Age/Gender Christopher Davila 48 y.o. male Room/Bed: 029C/029C  Code Status   Code Status: Prior  Home/SNF/Other Home Patient oriented to: self, place, time and situation Is this baseline? Yes   Triage Complete: Triage complete  Chief Complaint sick  Triage Note Patient reports generalized abdominal pain and epigastric pain x 2 days. Denies N/V/D, fevers/chills. Last BM yesterday.   Allergies Allergies  Allergen Reactions  . Morphine And Related Itching and Other (See Comments)    Pt prefers not to be given this drug    Level of Care/Admitting Diagnosis ED Disposition    ED Disposition Condition Comment   Admit  The patient appears reasonably stabilized for admission considering the current resources, flow, and capabilities available in the ED at this time, and I doubt any other [facility] requiring further screening and/or treatment in the ED prior to admission is  present.       B Medical/Surgery History Past Medical History:  Diagnosis Date  . Diabetes mellitus   . Gastroparesis due to DM ([place])   . Hypertension   . Neuropathy of lower extremity   . Pancreatitis 06/2012   History reviewed. No pertinent surgical history.   A IV Location/Drains/Wounds Patient Lines/Drains/Airways Status   Active Line/Drains/Airways    Name:   Placement date:   Placement time:   Site:   Days:   Peripheral IV 02/12/19 Left Wrist   02/12/19    1600    Wrist   less than 1          Intake/Output Last 24 hours No intake or output data in the 24 hours ending 02/12/19 2200  Labs/Imaging Results for orders placed or performed during the hospital encounter of 02/12/19 (from the past 48 hour(s))  Lipase, blood     Status: None   Collection Time: 02/12/19 11:54 AM  Result Value Ref Range   Lipase 34 11 - 51 U/L    Comment: Performed at [facility] [address], [place], [place] [address]   Comprehensive metabolic panel     Status: Abnormal   Collection Time: 02/12/19 11:54 AM  Result Value Ref Range   Sodium 133 (L) 135 - 145 mmol/L   Potassium 3.2 (L) 3.5 - 5.1 mmol/L   Chloride 100 98 - 111 mmol/L   CO2 20 (L) 22 - 32 mmol/L   Glucose, Bld 446 (H) 70 - 99 mg/dL   BUN 30 (H) 6 - 20 mg/dL   Creatinine, Ser 2.33 (H) 0.61 - 1.24 mg/dL   Calcium 8.4 (L) 8.9 - 10.3 mg/dL   Total Protein 6.5 6.5 - 8.1 g/dL   Albumin 2.2 (L) 3.5 - 5.0 g/dL   AST 20 15 - 41 U/L   ALT 14 0 - 44 U/L   Alkaline Phosphatase 68 38 - 126 U/L   Total Bilirubin 0.8 0.3 - 1.2 mg/dL   GFR calc non Af Amer 32 (L) >60 mL/min   GFR calc Af Amer 37 (L) >60 mL/min   Anion gap 13 5 - 15    Comment: Performed at [facility], [place] [address], [place] [address]  CBC     Status: Abnormal   Collection Time: 02/12/19 11:54 AM  Result Value Ref Range   WBC 12.3 (H) 4.0 - 10.5 K/uL   RBC 4.38 4.22 - 5.81 MIL/uL   Hemoglobin 13.1 13.0 - 17.0 g/dL  HCT 38.4 (L) 39.0 - 52.0 %   MCV 87.7 80.0 - 100.0 fL   MCH 29.9 26.0 - 34.0 pg   MCHC 34.1 30.0 - 36.0 g/dL   RDW 11.9 11.5 - 15.5 %   Platelets 253 150 - 400 K/uL   nRBC 0.0 0.0 - 0.2 %    Comment: Performed at [facility] [address], [place], [place] [address]  Urinalysis, Routine w reflex microscopic     Status: Abnormal   Collection Time: 02/12/19  2:19 PM  Result Value Ref Range   Color, Urine STRAW (A) YELLOW   APPearance CLEAR CLEAR   Specific Gravity, Urine 1.017 1.005 - 1.030   pH 6.0 5.0 - 8.0   Glucose, UA >=500 (A) NEGATIVE mg/dL   Hgb urine dipstick SMALL (A) NEGATIVE   Bilirubin Urine NEGATIVE NEGATIVE   Ketones, ur NEGATIVE NEGATIVE mg/dL   Protein, ur >=300 (A) NEGATIVE mg/dL   Nitrite NEGATIVE NEGATIVE   Leukocytes,Ua NEGATIVE NEGATIVE   RBC / HPF 6-10 0 - 5 RBC/hpf   WBC, UA 0-5 0 - 5 WBC/hpf   Bacteria, UA NONE SEEN NONE SEEN   Hyaline Casts, UA PRESENT     Comment: Performed at [facility],  [address], [place], [place] [address]  Troponin I (High Sensitivity)     Status: None   Collection Time: 02/12/19  3:36 PM  Result Value Ref Range   Troponin I (High Sensitivity) 14 <18 ng/L    Comment: (NOTE) Elevated high sensitivity troponin I (hsTnI) values and significant  changes across serial measurements may suggest ACS but many other  chronic and acute conditions are known to elevate hsTnI results.  Refer to the "Links" section for chest pain algorithms and additional  guidance. Performed at [facility], [place] [address], [place], [place] [address]   CBG monitoring, ED     Status: Abnormal   Collection Time: 02/12/19  5:46 PM  Result Value Ref Range   Glucose-Capillary 328 (H) 70 - 99 mg/dL   Comment 1 Notify RN    Comment 2 Document in Chart   Troponin I (High Sensitivity)     Status: Abnormal   Collection Time: 02/12/19  5:47 PM  Result Value Ref Range   Troponin I (High Sensitivity) 18 (H) <18 ng/L    Comment: (NOTE) Elevated high sensitivity troponin I (hsTnI) values and significant  changes across serial measurements may suggest ACS but many other  chronic and acute conditions are known to elevate hsTnI results.  Refer to the "Links" section for chest pain algorithms and additional  guidance. Performed at [facility], [place] [address] [place], [place] [address]   CBG monitoring, ED     Status: Abnormal   Collection Time: 02/12/19  9:14 PM  Result Value Ref Range   Glucose-Capillary 244 (H) 70 - 99 mg/dL   Dg Chest 2 View  Result Date: 02/12/2019 CLINICAL DATA:  Diffuse abdominal and epigastric pain for 2 days. EXAM: CHEST - 2 VIEW COMPARISON:  PA and lateral chest 06/18/2018. FINDINGS: Lungs are clear. There are very small bilateral pleural effusions. Heart size is normal. No pneumothorax. No acute or focal bony abnormality. IMPRESSION: Small bilateral pleural effusions.  Otherwise negative. Electronically Signed   By: Inge Rise M.D.   On:  02/12/2019 16:41    Pending Labs Unresulted Labs (From admission, onward)    Start     Ordered   02/12/19 2052  SARS Coronavirus 2 [facility] order, Performed in [facility] hospital lab) Nasopharyngeal Nasopharyngeal Swab  (Symptomatic/High Risk of Exposure/Tier 1 Patients Labs with Precautions)  Once,   STAT    Question Answer Comment  Is this test for diagnosis or screening Diagnosis of ill patient   Symptomatic for COVID-19 as defined by CDC Yes   Date of Symptom Onset 02/09/2019   Hospitalized for COVID-19 Yes   Admitted to ICU for COVID-19 No   Previously tested for COVID-19 No   Resident in a congregate (group) care setting No   Employed in healthcare setting No      02/12/19 2052          Vitals/Pain Today's Vitals   02/12/19 2030 02/12/19 2045 02/12/19 2122 02/12/19 2145  BP: (!) 161/110 (!) 173/116 (!) 165/118 (!) 174/107  Pulse: (!) 103 (!) 102 99 99  Resp: (!) 32 (!) 30    Temp:      TempSrc:      SpO2: 97% 97%  94%  PainSc:        Isolation Precautions No active isolations  Medications Medications  sodium chloride flush (NS) 0.9 % injection 3 mL (3 mLs Intravenous Not Given 02/12/19 1440)  albuterol (VENTOLIN HFA) 108 (90 Base) MCG/ACT inhaler 4 puff (has no administration in time range)  ipratropium (ATROVENT HFA) inhaler 2 puff (has no administration in time range)  sodium chloride 0.9 % bolus 1,000 mL (0 mLs Intravenous Stopped 02/12/19 1949)  sodium chloride 0.9 % bolus 1,000 mL (0 mLs Intravenous Stopped 02/12/19 1903)  pantoprazole (PROTONIX) injection 40 mg (40 mg Intravenous Given 02/12/19 1600)  alum & mag hydroxide-simeth (MAALOX/MYLANTA) 200-200-20 MG/5ML suspension 30 mL (30 mLs Oral Given 02/12/19 1602)    And  lidocaine (XYLOCAINE) 2 % viscous mouth solution 15 mL (15 mLs Oral Given 02/12/19 1602)  amLODipine (NORVASC) tablet 10 mg (10 mg Oral Given 02/12/19 1617)  metoprolol tartrate (LOPRESSOR) tablet 50 mg (50 mg Oral Given 02/12/19 1618)   albuterol (VENTOLIN HFA) 108 (90 Base) MCG/ACT inhaler 4 puff (4 puffs Inhalation Given 02/12/19 2024)    Mobility walks with person assist Low fall risk   Focused Assessments Pulmonary Assessment Handoff:  Lung sounds:   O2 Device: Room Air        R Recommendations: See Admitting Provider Note  Report given to:   Additional Notes:

## 2019-02-13 ENCOUNTER — Inpatient Hospital Stay (HOSPITAL_COMMUNITY): Payer: Self-pay

## 2019-02-13 DIAGNOSIS — N184 Chronic kidney disease, stage 4 (severe): Secondary | ICD-10-CM

## 2019-02-13 DIAGNOSIS — R739 Hyperglycemia, unspecified: Secondary | ICD-10-CM

## 2019-02-13 DIAGNOSIS — R1013 Epigastric pain: Secondary | ICD-10-CM

## 2019-02-13 DIAGNOSIS — N189 Chronic kidney disease, unspecified: Secondary | ICD-10-CM

## 2019-02-13 DIAGNOSIS — I509 Heart failure, unspecified: Secondary | ICD-10-CM

## 2019-02-13 DIAGNOSIS — R0602 Shortness of breath: Secondary | ICD-10-CM

## 2019-02-13 DIAGNOSIS — N179 Acute kidney failure, unspecified: Principal | ICD-10-CM

## 2019-02-13 DIAGNOSIS — Z7289 Other problems related to lifestyle: Secondary | ICD-10-CM

## 2019-02-13 DIAGNOSIS — Z789 Other specified health status: Secondary | ICD-10-CM

## 2019-02-13 DIAGNOSIS — R062 Wheezing: Secondary | ICD-10-CM

## 2019-02-13 DIAGNOSIS — R0902 Hypoxemia: Secondary | ICD-10-CM

## 2019-02-13 LAB — ECHOCARDIOGRAM COMPLETE: Weight: 2470.4 oz

## 2019-02-13 LAB — BASIC METABOLIC PANEL
Anion gap: 11 (ref 5–15)
BUN: 30 mg/dL — ABNORMAL HIGH (ref 6–20)
CO2: 21 mmol/L — ABNORMAL LOW (ref 22–32)
Calcium: 8.2 mg/dL — ABNORMAL LOW (ref 8.9–10.3)
Chloride: 105 mmol/L (ref 98–111)
Creatinine, Ser: 2.09 mg/dL — ABNORMAL HIGH (ref 0.61–1.24)
GFR calc Af Amer: 43 mL/min — ABNORMAL LOW (ref >60)
GFR calc non Af Amer: 37 mL/min — ABNORMAL LOW (ref >60)
Glucose, Bld: 262 mg/dL — ABNORMAL HIGH (ref 70–99)
Potassium: 3.2 mmol/L — ABNORMAL LOW (ref 3.5–5.1)
Sodium: 137 mmol/L (ref 135–145)

## 2019-02-13 LAB — CBC
HCT: 37.2 % — ABNORMAL LOW (ref 39.0–52.0)
Hemoglobin: 12.6 g/dL — ABNORMAL LOW (ref 13.0–17.0)
MCH: 30.1 pg (ref 26.0–34.0)
MCHC: 33.9 g/dL (ref 30.0–36.0)
MCV: 88.8 fL (ref 80.0–100.0)
Platelets: 246 10*3/uL (ref 150–400)
RBC: 4.19 MIL/uL — ABNORMAL LOW (ref 4.22–5.81)
RDW: 12.4 % (ref 11.5–15.5)
WBC: 14.5 10*3/uL — ABNORMAL HIGH (ref 4.0–10.5)
nRBC: 0 % (ref 0.0–0.2)

## 2019-02-13 LAB — TROPONIN I (HIGH SENSITIVITY)
Troponin I (High Sensitivity): 90 ng/L — ABNORMAL HIGH (ref <18)
Troponin I (High Sensitivity): 107 ng/L (ref <18)
Troponin I (High Sensitivity): 83 ng/L — ABNORMAL HIGH (ref <18)
Troponin I (High Sensitivity): 83 ng/L — ABNORMAL HIGH (ref <18)

## 2019-02-13 LAB — GLUCOSE, CAPILLARY
Glucose-Capillary: 235 mg/dL — ABNORMAL HIGH (ref 70–99)
Glucose-Capillary: 222 mg/dL — ABNORMAL HIGH (ref 70–99)
Glucose-Capillary: 429 mg/dL — ABNORMAL HIGH (ref 70–99)

## 2019-02-13 LAB — HIV ANTIBODY (ROUTINE TESTING W REFLEX): HIV Screen 4th Generation wRfx: NONREACTIVE

## 2019-02-13 LAB — D-DIMER, QUANTITATIVE: D-Dimer, Quant: 1.37 ug/mL-FEU — ABNORMAL HIGH (ref 0.00–0.50)

## 2019-02-13 LAB — CBG MONITORING, ED: Glucose-Capillary: 189 mg/dL — ABNORMAL HIGH (ref 70–99)

## 2019-02-13 MED ORDER — TECHNETIUM TO 99M ALBUMIN AGGREGATED: 1.5000 | Freq: Once | INTRAVENOUS | Status: DC | PRN

## 2019-02-13 MED ORDER — FUROSEMIDE 10 MG/ML IJ SOLN
80.0000 mg | Freq: Once | INTRAMUSCULAR | Status: AC
  Administered 2019-02-13: 13:00:00 80 mg via INTRAVENOUS
  Filled 2019-02-13: qty 8

## 2019-02-13 MED ORDER — FUROSEMIDE 10 MG/ML IJ SOLN
40.0000 mg | Freq: Once | INTRAMUSCULAR | Status: AC
  Administered 2019-02-13: 01:00:00 40 mg via INTRAVENOUS
  Filled 2019-02-13: qty 4

## 2019-02-13 MED ORDER — ENOXAPARIN SODIUM 40 MG/0.4ML ~~LOC~~ SOLN
40.0000 mg | SUBCUTANEOUS | Status: DC
  Administered 2019-02-13 – 2019-02-16 (×3): 40 mg via SUBCUTANEOUS
  Filled 2019-02-13 (×3): qty 0.4

## 2019-02-13 MED ORDER — HEPARIN BOLUS VIA INFUSION
5000.0000 [IU] | Freq: Once | INTRAVENOUS | Status: AC
  Administered 2019-02-13: 5000 [IU] via INTRAVENOUS
  Filled 2019-02-13: qty 5000

## 2019-02-13 MED ORDER — POTASSIUM CHLORIDE CRYS ER 20 MEQ PO TBCR
40.0000 meq | EXTENDED_RELEASE_TABLET | ORAL | Status: AC
  Administered 2019-02-13 (×2): 40 meq via ORAL
  Filled 2019-02-13 (×2): qty 2

## 2019-02-13 MED ORDER — HEPARIN (PORCINE) 25000 UT/250ML-% IV SOLN
1000.0000 [IU]/h | INTRAVENOUS | Status: DC
  Administered 2019-02-13: 1000 [IU]/h via INTRAVENOUS
  Filled 2019-02-13: qty 250

## 2019-02-13 MED ORDER — PANTOPRAZOLE SODIUM 40 MG PO TBEC
80.0000 mg | DELAYED_RELEASE_TABLET | Freq: Every day | ORAL | Status: DC
  Administered 2019-02-14 – 2019-02-20 (×7): 80 mg via ORAL
  Filled 2019-02-13 (×8): qty 2

## 2019-02-13 MED ORDER — CLOTRIMAZOLE 1 % EX CREA
TOPICAL_CREAM | Freq: Two times a day (BID) | CUTANEOUS | Status: DC
  Administered 2019-02-13: 1 via TOPICAL
  Administered 2019-02-14: 09:00:00 via TOPICAL
  Administered 2019-02-14: 1 via TOPICAL
  Administered 2019-02-15 (×2): via TOPICAL
  Administered 2019-02-17: 1 via TOPICAL
  Administered 2019-02-17 – 2019-02-18 (×2): via TOPICAL
  Administered 2019-02-18: 1 via TOPICAL
  Administered 2019-02-19 – 2019-02-20 (×2): via TOPICAL
  Filled 2019-02-13: qty 15

## 2019-02-13 MED ORDER — ACETAMINOPHEN 325 MG PO TABS
650.0000 mg | ORAL_TABLET | Freq: Four times a day (QID) | ORAL | Status: DC
  Administered 2019-02-13 (×2): 650 mg via ORAL
  Filled 2019-02-13 (×2): qty 2

## 2019-02-13 MED ORDER — INSULIN ASPART 100 UNIT/ML ~~LOC~~ SOLN
10.0000 [IU] | Freq: Once | SUBCUTANEOUS | Status: AC
  Administered 2019-02-13: 22:00:00 10 [IU] via SUBCUTANEOUS

## 2019-02-13 NOTE — ED Notes (Signed)
ED TO INPATIENT HANDOFF REPORT  ED Nurse Name and Phone #: [facility] [phone]  S Name/Age/Gender Christopher Davila 48 y.o. male Room/Bed: 029C/029C  Code Status   Code Status: Full Code  Home/SNF/Other Home Patient oriented to: self, place, time and situation Is this baseline? Yes   Triage Complete: Triage complete  Chief Complaint sick  Triage Note Patient reports generalized abdominal pain and epigastric pain x 2 days. Denies N/V/D, fevers/chills. Last BM yesterday.   Allergies Allergies  Allergen Reactions  . Morphine And Related Itching and Other (See Comments)    Pt prefers not to be given this drug    Level of Care/Admitting Diagnosis ED Disposition    ED Disposition Condition [place]: [place] [[id number]]  Level of Care: Telemetry Cardiac [103]  Covid Evaluation: Confirmed COVID Negative  Diagnosis: Heart failure [id number]  Admitting Physician: Gifford Shave [id number]  Attending Physician: Leeanne Rio [phone]  Estimated length of stay: past midnight tomorrow  Certification:: I certify this patient will need inpatient services for at least 2 midnights  PT Class (Do Not Modify): Inpatient [101]  PT Acc Code (Do Not Modify): Private [1]       B Medical/Surgery History Past Medical History:  Diagnosis Date  . Diabetes mellitus   . Gastroparesis due to DM ([place])   . Hypertension   . Neuropathy of lower extremity   . Pancreatitis 06/2012   History reviewed. No pertinent surgical history.   A IV Location/Drains/Wounds Patient Lines/Drains/Airways Status   Active Line/Drains/Airways    Name:   Placement date:   Placement time:   Site:   Days:   Peripheral IV 02/12/19 Left Wrist   02/12/19    1600    Wrist   1          Intake/Output Last 24 hours  Intake/Output Summary (Last 24 hours) at 02/13/2019 0743 Last data filed at 02/13/2019 0707 Gross per 24 hour  Intake -  Output 700 ml  Net -700 ml     Labs/Imaging Results for orders placed or performed during the hospital encounter of 02/12/19 (from the past 48 hour(s))  Lipase, blood     Status: None   Collection Time: 02/12/19 11:54 AM  Result Value Ref Range   Lipase 34 11 - 51 U/L    Comment: Performed at [facility], [address] [place], [place] [address]  Comprehensive metabolic panel     Status: Abnormal   Collection Time: 02/12/19 11:54 AM  Result Value Ref Range   Sodium 133 (L) 135 - 145 mmol/L   Potassium 3.2 (L) 3.5 - 5.1 mmol/L   Chloride 100 98 - 111 mmol/L   CO2 20 (L) 22 - 32 mmol/L   Glucose, Bld 446 (H) 70 - 99 mg/dL   BUN 30 (H) 6 - 20 mg/dL   Creatinine, Ser 2.33 (H) 0.61 - 1.24 mg/dL   Calcium 8.4 (L) 8.9 - 10.3 mg/dL   Total Protein 6.5 6.5 - 8.1 g/dL   Albumin 2.2 (L) 3.5 - 5.0 g/dL   AST 20 15 - 41 U/L   ALT 14 0 - 44 U/L   Alkaline Phosphatase 68 38 - 126 U/L   Total Bilirubin 0.8 0.3 - 1.2 mg/dL   GFR calc non Af Amer 32 (L) >60 mL/min   GFR calc Af Amer 37 (L) >60 mL/min   Anion gap 13 5 - 15    Comment: Performed at  [facility], [place] [address], [place], [place] [address]  CBC     Status: Abnormal   Collection Time: 02/12/19 11:54 AM  Result Value Ref Range   WBC 12.3 (H) 4.0 - 10.5 K/uL   RBC 4.38 4.22 - 5.81 MIL/uL   Hemoglobin 13.1 13.0 - 17.0 g/dL   HCT 38.4 (L) 39.0 - 52.0 %   MCV 87.7 80.0 - 100.0 fL   MCH 29.9 26.0 - 34.0 pg   MCHC 34.1 30.0 - 36.0 g/dL   RDW 11.9 11.5 - 15.5 %   Platelets 253 150 - 400 K/uL   nRBC 0.0 0.0 - 0.2 %    Comment: Performed at [facility], [place] [address] [place], [place] [address]  Urinalysis, Routine w reflex microscopic     Status: Abnormal   Collection Time: 02/12/19  2:19 PM  Result Value Ref Range   Color, Urine STRAW (A) YELLOW   APPearance CLEAR CLEAR   Specific Gravity, Urine 1.017 1.005 - 1.030   pH 6.0 5.0 - 8.0   Glucose, UA >=500 (A) NEGATIVE mg/dL   Hgb urine dipstick SMALL (A) NEGATIVE   Bilirubin Urine  NEGATIVE NEGATIVE   Ketones, ur NEGATIVE NEGATIVE mg/dL   Protein, ur >=300 (A) NEGATIVE mg/dL   Nitrite NEGATIVE NEGATIVE   Leukocytes,Ua NEGATIVE NEGATIVE   RBC / HPF 6-10 0 - 5 RBC/hpf   WBC, UA 0-5 0 - 5 WBC/hpf   Bacteria, UA NONE SEEN NONE SEEN   Hyaline Casts, UA PRESENT     Comment: Performed at [facility], [address], [place], [place] [address]  Troponin I (High Sensitivity)     Status: None   Collection Time: 02/12/19  3:36 PM  Result Value Ref Range   Troponin I (High Sensitivity) 14 <18 ng/L    Comment: (NOTE) Elevated high sensitivity troponin I (hsTnI) values and significant  changes across serial measurements may suggest ACS but many other  chronic and acute conditions are known to elevate hsTnI results.  Refer to the "Links" section for chest pain algorithms and additional  guidance. Performed at [facility], [place] [address], [place], [place] [address]   CBG monitoring, ED     Status: Abnormal   Collection Time: 02/12/19  5:46 PM  Result Value Ref Range   Glucose-Capillary 328 (H) 70 - 99 mg/dL   Comment 1 Notify RN    Comment 2 Document in Chart   Troponin I (High Sensitivity)     Status: Abnormal   Collection Time: 02/12/19  5:47 PM  Result Value Ref Range   Troponin I (High Sensitivity) 18 (H) <18 ng/L    Comment: (NOTE) Elevated high sensitivity troponin I (hsTnI) values and significant  changes across serial measurements may suggest ACS but many other  chronic and acute conditions are known to elevate hsTnI results.  Refer to the "Links" section for chest pain algorithms and additional  guidance. Performed at [facility], [place] [address], [place], [place] [address]   CBG monitoring, ED     Status: Abnormal   Collection Time: 02/12/19  9:14 PM  Result Value Ref Range   Glucose-Capillary 244 (H) 70 - 99 mg/dL  SARS Coronavirus 2 [facility] order, Performed in [facility] hospital lab) Nasopharyngeal Nasopharyngeal Swab      Status: None   Collection Time: 02/12/19  9:20 PM   Specimen: Nasopharyngeal Swab  Result Value Ref Range   SARS Coronavirus  2 NEGATIVE NEGATIVE    Comment: (NOTE) If result is NEGATIVE SARS-CoV-2 target nucleic acids are NOT DETECTED. The SARS-CoV-2 RNA is generally detectable in upper and lower  respiratory specimens during the acute phase of infection. The lowest  concentration of SARS-CoV-2 viral copies this assay can detect is 250  copies / mL. A negative result does not preclude SARS-CoV-2 infection  and should not be used as the sole basis for treatment or other  patient management decisions.  A negative result may occur with  improper specimen collection / handling, submission of specimen other  than nasopharyngeal swab, presence of viral mutation(s) within the  areas targeted by this assay, and inadequate number of viral copies  (<250 copies / mL). A negative result must be combined with clinical  observations, patient history, and epidemiological information. If result is POSITIVE SARS-CoV-2 target nucleic acids are DETECTED. The SARS-CoV-2 RNA is generally detectable in upper and lower  respiratory specimens dur ing the acute phase of infection.  Positive  results are indicative of active infection with SARS-CoV-2.  Clinical  correlation with patient history and other diagnostic information is  necessary to determine patient infection status.  Positive results do  not rule out bacterial infection or co-infection with other viruses. If result is PRESUMPTIVE POSTIVE SARS-CoV-2 nucleic acids MAY BE PRESENT.   A presumptive positive result was obtained on the submitted specimen  and confirmed on repeat testing.  While 2019 novel coronavirus  (SARS-CoV-2) nucleic acids may be present in the submitted sample  additional confirmatory testing may be necessary for epidemiological  and / or clinical management purposes  to differentiate between  SARS-CoV-2 and other Sarbecovirus  currently known to infect humans.  If clinically indicated additional testing with an alternate test  methodology [phone]) is advised. The SARS-CoV-2 RNA is generally  detectable in upper and lower respiratory sp ecimens during the acute  phase of infection. The expected result is Negative. Fact Sheet for Patients:  StrictlyIdeas.no Fact Sheet for Healthcare Providers: BankingDealers.co.za This test is not yet approved or cleared by the [place] FDA and has been authorized for detection and/or diagnosis of SARS-CoV-2 by FDA under an Emergency Use Authorization (EUA).  This EUA will remain in effect (meaning this test can be used) for the duration of the COVID-19 declaration under Section 564(b)(1) of the Act, 21 U.S.C. section 360bbb-3(b)(1), unless the authorization is terminated or revoked sooner. Performed at [facility], [place] [address], [place] [address]   CBC     Status: Abnormal   Collection Time: 02/12/19 10:38 PM  Result Value Ref Range   WBC 16.8 (H) 4.0 - 10.5 K/uL   RBC 5.20 4.22 - 5.81 MIL/uL   Hemoglobin 15.5 13.0 - 17.0 g/dL   HCT 47.3 39.0 - 52.0 %   MCV 91.0 80.0 - 100.0 fL   MCH 29.8 26.0 - 34.0 pg   MCHC 32.8 30.0 - 36.0 g/dL   RDW 12.3 11.5 - 15.5 %   Platelets 257 150 - 400 K/uL   nRBC 0.0 0.0 - 0.2 %    Comment: Performed at [facility], [place] [address], [place], [place] [address]  Creatinine, serum     Status: Abnormal   Collection Time: 02/12/19 10:38 PM  Result Value Ref Range   Creatinine, Ser 2.12 (H) 0.61 - 1.24 mg/dL   GFR calc non Af Amer 36 (L) >60 mL/min   GFR calc Af Amer 42 (L) >60 mL/min  Comment: Performed at [facility], [place] [address] [place], [place] [address]  Brain natriuretic peptide     Status: Abnormal   Collection Time: 02/12/19 10:38 PM  Result Value Ref Range   B Natriuretic Peptide 567.4 (H) 0.0 - 100.0 pg/mL    Comment: Performed at [facility] [address], [place], [place] [address]  Hemoglobin A1c     Status: Abnormal   Collection Time: 02/12/19 10:38 PM  Result Value Ref Range   Hgb A1c MFr Bld 8.3 (H) 4.8 - 5.6 %    Comment: (NOTE) Pre diabetes:          5.7%-6.4% Diabetes:              >6.4% Glycemic control for   <7.0% adults with diabetes    Mean Plasma Glucose 191.51 mg/dL    Comment: Performed at [facility] [address], [place], [place] [address]  Lipid panel     Status: Abnormal   Collection Time: 02/12/19 10:38 PM  Result Value Ref Range   Cholesterol 244 (H) 0 - 200 mg/dL   Triglycerides 182 (H) <150 mg/dL   HDL 72 >40 mg/dL   Total CHOL/HDL Ratio 3.4 RATIO   VLDL 36 0 - 40 mg/dL   LDL Cholesterol 136 (H) 0 - 99 mg/dL    Comment:        Total Cholesterol/HDL:CHD Risk Coronary Heart Disease Risk Table                     Men   Women  1/2 Average Risk   3.4   3.3  Average Risk       5.0   4.4  2 X Average Risk   9.6   7.1  3 X Average Risk  23.4   11.0        Use the calculated Patient Ratio above and the CHD Risk Table to determine the patient's CHD Risk.        ATP III CLASSIFICATION (LDL):  <100     mg/dL   Optimal  100-129  mg/dL   Near or Above                    Optimal  130-159  mg/dL   Borderline  160-189  mg/dL   High  >190     mg/dL   Very High Performed at [facility] [address], [place], [place] [address]   Troponin I (High Sensitivity)     Status: Abnormal   Collection Time: 02/12/19 10:38 PM  Result Value Ref Range   Troponin I (High Sensitivity) 40 (H) <18 ng/L    Comment: RESULT CALLED TO, READ BACK BY AND VERIFIED WITH: CRUZ S,RN 02/12/19 2346 WAYK Performed at [facility], [place] [address] [place], [place] [address]   D-dimer, quantitative (not at [facility])     Status: Abnormal   Collection Time: 02/12/19 11:00 PM  Result Value Ref Range   D-Dimer, Quant 1.37 (H) 0.00 - 0.50 ug/mL-FEU    Comment: (NOTE) At the manufacturer cut-off  of 0.50 ug/mL FEU, this assay has been documented to exclude PE with a sensitivity and negative predictive value of 97 to 99%.  At this time, this assay has not been approved by the FDA to exclude DVT/VTE. Results should be correlated with clinical presentation. Performed at [facility], [place] [address] [place], [place] [address]  Troponin I (High Sensitivity)     Status: Abnormal   Collection Time: 02/13/19 12:31 AM  Result Value Ref Range   Troponin I (High Sensitivity) 90 (H) <18 ng/L    Comment: RESULT CALLED TO, READ BACK BY AND VERIFIED WITH: POWELL A,RN 02/13/19 0142 WAYK Performed at [facility], [place] [address], [place], [place] [id number]   Basic metabolic panel     Status: Abnormal   Collection Time: 02/13/19  3:09 AM  Result Value Ref Range   Sodium 137 135 - 145 mmol/L   Potassium 3.2 (L) 3.5 - 5.1 mmol/L   Chloride 105 98 - 111 mmol/L   CO2 21 (L) 22 - 32 mmol/L   Glucose, Bld 262 (H) 70 - 99 mg/dL   BUN 30 (H) 6 - 20 mg/dL   Creatinine, Ser 2.09 (H) 0.61 - 1.24 mg/dL   Calcium 8.2 (L) 8.9 - 10.3 mg/dL   GFR calc non Af Amer 37 (L) >60 mL/min   GFR calc Af Amer 43 (L) >60 mL/min   Anion gap 11 5 - 15    Comment: Performed at [facility] [address] [place], [place] [address]  CBC     Status: Abnormal   Collection Time: 02/13/19  3:09 AM  Result Value Ref Range   WBC 14.5 (H) 4.0 - 10.5 K/uL   RBC 4.19 (L) 4.22 - 5.81 MIL/uL   Hemoglobin 12.6 (L) 13.0 - 17.0 g/dL   HCT 37.2 (L) 39.0 - 52.0 %   MCV 88.8 80.0 - 100.0 fL   MCH 30.1 26.0 - 34.0 pg   MCHC 33.9 30.0 - 36.0 g/dL   RDW 12.4 11.5 - 15.5 %   Platelets 246 150 - 400 K/uL   nRBC 0.0 0.0 - 0.2 %    Comment: Performed at [facility], [place] [address] [place], [place] [address]  Troponin I (High Sensitivity)     Status: Abnormal   Collection Time: 02/13/19  3:09 AM  Result Value Ref Range   Troponin I (High Sensitivity) 107 (HH) <18 ng/L    Comment: CRITICAL RESULT CALLED  TO, READ BACK BY AND VERIFIED WITH: MOON K,RN 02/13/19 0412 WAYK Performed at [facility] [address], [place], [place] [address]    Dg Chest 2 View  Result Date: 02/12/2019 CLINICAL DATA:  Diffuse abdominal and epigastric pain for 2 days. EXAM: CHEST - 2 VIEW COMPARISON:  PA and lateral chest 06/18/2018. FINDINGS: Lungs are clear. There are very small bilateral pleural effusions. Heart size is normal. No pneumothorax. No acute or focal bony abnormality. IMPRESSION: Small bilateral pleural effusions.  Otherwise negative. Electronically Signed   By: Inge Rise M.D.   On: 02/12/2019 16:41   Abd 1 View (kub)  Result Date: 02/12/2019 CLINICAL DATA:  48 year old male with generalized abdominal pain. EXAM: PORTABLE CHEST 1 VIEW COMPARISON:  Chest radiograph dated 02/12/2019 and CT dated 12/21/2007. FINDINGS: Minimal bibasilar haziness may represent atelectatic changes or trace pleural effusion. There is no focal consolidation or pneumothorax. The cardiac silhouette is within normal limits. No acute osseous pathology. There is no bowel dilatation or evidence of obstruction. Air is noted throughout the colon. No free air or radiopaque calculi identified. The osseous structures and soft tissues are unremarkable. IMPRESSION: 1. No focal consolidation. 2. No evidence of bowel obstruction. Electronically Signed   By: Anner Crete M.D.   On: 02/12/2019 23:54   Portable Chest 1 View  Result  Date: 02/12/2019 CLINICAL DATA:  48 year old male with generalized abdominal pain. EXAM: PORTABLE CHEST 1 VIEW COMPARISON:  Chest radiograph dated 02/12/2019 and CT dated 12/21/2007. FINDINGS: Minimal bibasilar haziness may represent atelectatic changes or trace pleural effusion. There is no focal consolidation or pneumothorax. The cardiac silhouette is within normal limits. No acute osseous pathology. There is no bowel dilatation or evidence of obstruction. Air is noted throughout the colon. No free air or  radiopaque calculi identified. The osseous structures and soft tissues are unremarkable. IMPRESSION: 1. No focal consolidation. 2. No evidence of bowel obstruction. Electronically Signed   By: Anner Crete M.D.   On: 02/12/2019 23:54    Pending Labs Unresulted Labs (From admission, onward)    Start     Ordered   02/13/19 1300  Heparin level (unfractionated)  Once-Timed,   STAT     02/13/19 0432   02/12/19 2330  HIV Antibody (routine testing w rflx)  Once,   R     02/12/19 2330          Vitals/Pain Today's Vitals   02/13/19 0700 02/13/19 0711 02/13/19 0711 02/13/19 0715  BP: (!) 142/97   (!) 148/97  Pulse: 96   96  Resp:  18  18  Temp:      TempSrc:      SpO2: 98%   98%  PainSc:   0-No pain     Isolation Precautions No active isolations  Medications Medications  sodium chloride flush (NS) 0.9 % injection 3 mL (3 mLs Intravenous Not Given 02/12/19 1440)  albuterol (VENTOLIN HFA) 108 (90 Base) MCG/ACT inhaler 4 puff (has no administration in time range)  ipratropium (ATROVENT HFA) inhaler 2 puff (has no administration in time range)  azithromycin (ZITHROMAX) tablet 250 mg (has no administration in time range)  amLODipine (NORVASC) tablet 10 mg (10 mg Oral Given 02/13/19 0032)  atorvastatin (LIPITOR) tablet 40 mg (40 mg Oral Given 02/13/19 0054)  metoprolol tartrate (LOPRESSOR) tablet 50 mg (50 mg Oral Given 02/13/19 0032)  pantoprazole (PROTONIX) EC tablet 20 mg (has no administration in time range)  predniSONE (DELTASONE) tablet 40 mg (has no administration in time range)  gabapentin (NEURONTIN) capsule 800 mg (800 mg Oral Given 02/13/19 0054)  acetaminophen (TYLENOL) tablet 650 mg (has no administration in time range)    Or  acetaminophen (TYLENOL) suppository 650 mg (has no administration in time range)  lisinopril (ZESTRIL) tablet 20 mg (has no administration in time range)    And  hydrochlorothiazide (MICROZIDE) capsule 12.5 mg (has no administration in time range)   insulin aspart (novoLOG) injection 0-9 Units (has no administration in time range)  insulin glargine (LANTUS) injection 5 Units (has no administration in time range)  heparin ADULT infusion 100 units/mL (25000 units/248mL sodium chloride 0.45%) (1,000 Units/hr Intravenous Rate/Dose Verify 02/13/19 0743)  sodium chloride 0.9 % bolus 1,000 mL (0 mLs Intravenous Stopped 02/12/19 1949)  sodium chloride 0.9 % bolus 1,000 mL (0 mLs Intravenous Stopped 02/12/19 1903)  pantoprazole (PROTONIX) injection 40 mg (40 mg Intravenous Given 02/12/19 1600)  alum & mag hydroxide-simeth (MAALOX/MYLANTA) 200-200-20 MG/5ML suspension 30 mL (30 mLs Oral Given 02/12/19 1602)    And  lidocaine (XYLOCAINE) 2 % viscous mouth solution 15 mL (15 mLs Oral Given 02/12/19 1602)  amLODipine (NORVASC) tablet 10 mg (10 mg Oral Given 02/12/19 1617)  metoprolol tartrate (LOPRESSOR) tablet 50 mg (50 mg Oral Given 02/12/19 1618)  albuterol (VENTOLIN HFA) 108 (90 Base) MCG/ACT inhaler 4 puff (4 puffs Inhalation  Given 02/12/19 2024)  furosemide (LASIX) injection 40 mg (40 mg Intravenous Given 02/13/19 0034)  heparin bolus via infusion 5,000 Units (5,000 Units Intravenous Bolus from Bag 02/13/19 0505)    Mobility walks Low fall risk   Focused Assessments Cardiac Assessment Handoff:    Lab Results  Component Value Date   CKTOTAL 179 12/22/2007   CKMB 0.9 12/22/2007   TROPONINI <0.03 05/24/2015   Lab Results  Component Value Date   DDIMER 1.37 (H) 02/12/2019   Does the Patient currently have chest pain? No     R Recommendations: See Admitting Provider Note  Report given to:   Additional Notes:

## 2019-02-13 NOTE — Progress Notes (Signed)
Curb sided cardiology about this patient concerning chest pain and increasing troponins since admission.  Patient complained of chest pain and BNP was elevated at 500.  Patient has no history of heart failure.  Most previous ejection fraction from 2016 was 60 to 65% with grade 1 diastolic dysfunction.  Our concern for further ACS work-up is from changes in troponin throughout the day.  Patient denies any current chest pain.  Spoke with cardiology on-call who reviewed patient's chart and does not believe that his troponins are ischemic in nature as there was no wall motion abnormality on the new echocardiogram.  They do not have any further recommendations for this patient at this time.  Can call if we have any further concerns or questions.  Wilber Oliphant, M.D.  PGY-2  Family Medicine  [phone] 02/13/2019 7:09 PM

## 2019-02-13 NOTE — Progress Notes (Addendum)
Family Medicine Teaching Service Daily Progress Note Intern Pager: [phone]  Patient name: Christopher Davila record number: [id number] Date of birth: 04-26-1971 Age: 48 y.o. Gender: male  Primary Care Provider: Marliss Coots, NP Consultants: None Code Status: FULL  Pt Overview and Major Events to Date:  Admitted 08/28.  Assessment and Plan:  Christopher Davila is a 48 y.o. male presenting with dominant. PMH is significant for T2 DM, HTN, reports fatty liver, HLD, EtOH use.  Shortness of breath with new O2 requirement Patient initially presented to the emergency room for abdominal pain. Throughout his work-up patient noticed increased shortness of breath, pleuritic chest pain on inhalation, and the patient was walked down the hall and desaturated down into the 80s. Patient also noted that he was seen by his PCP a few days ago for this shortness of breath and pleuritic chest pain. He was started on prednisone 40 mg and azithromycin. Patient reports no history of asthma or COPD.Patient does not have any oxygen requirement at baseline.Patient reports he has had a cough over the past few days.Chest x-ray showed small bilateral pleural effusionsprior to fluid boluses.Troponins were trended 14>18>40.Per ED provider patient's shortness of breath did worsen after receiving fluid boluses. This is concerning for volume overload.   This morning he states he has continued shortness of breath but otherwise has improved he is satting well on room air 98%. He says he has a cough with phlegm that improved with his inhaler.   - VQ scan no evidence of PE - Consider echocardiogram - Continue to trend troponins 14>18>40>90>107 - Elevated BNP 567.4 - elevated D-dimer 1.37  - Consider cardiology consult pending an ECHO - Continuous telemetry -Continuous O2 monitoring - Continue albuterol and ipratropium nebulizers every 4 hours - Continue azithromycin, first dose 8/28 prior to admission.  Will  receive second dose this morning -Continue prednisone 40 mg daily, first dose 8/28 prior to admission.  Will receive second dose this morning -IV Lasix 40 mg x 1 in ED; Give 26m IV lasix now --Daily wt -- I/Os - PT/OT - Elevate legs  AKI Patient's creatinine on admission was 2.33.  Patient's baseline runs approximately 1.5.  Patient was given 2 L of normal saline in the emergency room.  -- Cr 2.09, K 3.2 --Replete K, check phos and mag level - Morning BMPs - Holding fluids at this time given concern for fluid overload - Avoid nephrotoxic drugs   Abdominal pain Patient reports having abdominal pain that is accompanied the shortness of breath and chest pain.  This is been going on for a few days. He feels as if it is a intermittent stabbing pain.  Reports he has not taken anything for the abdominal pain.  He also says he has had pancreatitis in the past, although lipase was within normal limits.  Patient problem list includes gastroparesis due to diabetes mellitus.  Patient denies nausea, vomiting, diarrhea, constipation. Endorses cough with phlegm. No issues with eating. Patient reports last bowel movement was yesterday.  KUB was normal. Abdominal xray was normal. Possible GERD. -Tylenol [id number] - Protonix 80 mg daily -Monitor with serial abdominal exams - Consider CT scan with worsening.  Diabetes type 2 with neuropathy Patient's blood sugar on admission was 446.  Hemoglobin A1c is 8.3 home medication includes metformin 500 mg twice daily.CBGs have recently ranged from 189-235.  Patient CBG is likely elevated the setting of receiving steroids as well. Right foot interdigital rash on physical exam appears to be fungal, he  states he was being treated with a topical cream. - Monitor CBGs - Lantus 5 units - SSIS --lotrimin cream for toe rash   HTN Blood pressures since admission have ranged from 140-172/ 85-115. Home medications include Norvasc 10 mg, Zestoretic 20-12.5 daily,  metoprolol 50 mg twice daily.  On admission patient received 2 L boluses of fluid which is could be contributing to patient's hypertension.  He was also given his home amlodipine dose, and saw improvement in blood pressure after this. - Monitor blood pressures - Continue amlodipine - Hold HCTZ/lisinopril combo pill in setting of AKI  Reports fatty liver Admission labs showed albumin AST-20, ALT-14, lipase-34, alk phos- 68.  Albumin  HLD Home medications include Lipitor 40 mg daily. Total chol 244. Triglycerides 182. LDL 136. - Continue home Lipitor dose   Alcohol abuse Patient drinks anywhere from 2 beers to 12 pack on a given day.  Also has a history of alcoholic pancreatitis, and alcoholic gastritis and has been seen in ED for this multiple times.  Reports never being admitted to the hospital for alcohol withdrawal.  Does not display any symptoms of active withdrawal at this time. -Monitor CIWA scores without Ativan, can add Ativan if display symptoms of withdrawal -CIWA score 2  FEN/GI: Carb modified Prophylaxis: Lovenox 40 mg daily    Disposition: Home pending ECHO, improved respiratory status.  Subjective:  He states he is doing okay.  His shortness of breath has improved.  Continues to endorse diffuse epigastric discomfort pain scale is 6 out of 10 that is stabbing and intermittent in nature. It is unclear how long this has been going on he states since Tuesday or maybe two weeks ago. He will try Tylenol.  Patient also has right foot interdigital rash he states it was being treated with an unknown cream.    Objective: Temp:  [98.5 F (36.9 C)] 98.5 F (36.9 C) (08/28 1127) Pulse Rate:  [87-113] 96 (08/29 0715) Resp:  [16-32] 18 (08/29 0715) BP: (134-213)/(84-139) 148/97 (08/29 0715) SpO2:  [94 %-100 %] 98 % (08/29 0715)   Physical Exam:  General: Appears well, no acute distress. Age appropriate.  Cardiac: RRR, normal heart sounds, no murmurs Respiratory: CTAB,  normal effort Abdomen: soft, tender to palpation, moderately distended and hard to the touch Extremities: 2+ pitting edema, no cyanosis. Skin: Warm and dry, right foot interdigital rash that is not erythematous but scaling. Neuro: alert and oriented, no focal deficits Psych: normal affect  Laboratory: Recent Labs  Lab 02/12/19 1154 02/12/19 2238 02/13/19 0309  WBC 12.3* 16.8* 14.5*  HGB 13.1 15.5 12.6*  HCT 38.4* 47.3 37.2*  PLT 253 257 246   Recent Labs  Lab 02/12/19 1154 02/12/19 2238 02/13/19 0309  NA 133*  --  137  K 3.2*  --  3.2*  CL 100  --  105  CO2 20*  --  21*  BUN 30*  --  30*  CREATININE 2.33* 2.12* 2.09*  CALCIUM 8.4*  --  8.2*  PROT 6.5  --   --   BILITOT 0.8  --   --   ALKPHOS 68  --   --   ALT 14  --   --   AST 20  --   --   GLUCOSE 446*  --  262*   Lipid panel [[id number]] (Abnormal) Collected: 02/12/19 2238  Specimen: Blood from Hand Updated: 02/12/19 2338   Cholesterol 244High  mg/dL    Triglycerides 182High  mg/dL    HDL  72 mg/dL    Total CHOL/HDL Ratio 3.4 RATIO    VLDL 36 mg/dL    LDL Cholesterol 136High  mg/dL    Hemoglobin A1c [[id number]] (Abnormal) Collected: 02/12/19 2238  Specimen: Blood from Hand Updated: 02/12/19 2314   Hgb A1c MFr Bld 8.3High  %      Imaging/Diagnostic Tests: CHEST - 2 VIEW COMPARISON:  PA and lateral chest 06/18/2018. Lungs are clear. There are very small bilateral pleural effusions. Heart size is normal. No pneumothorax. No acute or focal bony abnormality. IMPRESSION: Small bilateral pleural effusions.  Otherwise negative.   PORTABLE CHEST 1 VIEW COMPARISON:  Chest radiograph dated 02/12/2019 and CT dated 12/21/2007. IMPRESSION: 1. No focal consolidation. 2. No evidence of bowel obstruction.  PORTABLE CHEST 1 VIEW COMPARISON:  Chest radiograph dated 02/12/2019 and CT dated 12/21/2007. IMPRESSION: 1. No focal consolidation. 2. No evidence of bowel obstruction.  NUCLEAR MEDICINE PERFUSION LUNG  SCAN COMPARISON:  Chest radiograph 02/12/2019 IMPRESSION: No evidence of pulmonary embolism.   Gerlene Fee, DO 02/13/2019, 11:47 AM PGY-1, [place] Intern pager: [phone], text pages welcome

## 2019-02-13 NOTE — Progress Notes (Signed)
Echocardiogram 2D Echocardiogram has been performed.  Christopher Davila Lillias Difrancesco 02/13/2019, 3:57 PM

## 2019-02-13 NOTE — Progress Notes (Signed)
ANTICOAGULATION CONSULT NOTE - Initial Consult  Pharmacy Consult for Heparin  Indication: Rule out pulmonary embolus  Allergies  Allergen Reactions  . Morphine And Related Itching and Other (See Comments)    Pt prefers not to be given this drug    Patient Measurements: ~70 kg Vital Signs: BP: 140/96 (08/29 0215) Pulse Rate: 87 (08/29 0215)  Labs: Recent Labs    02/12/19 1154  02/12/19 2238 02/13/19 0031 02/13/19 0309  HGB 13.1  --  15.5  --  12.6*  HCT 38.4*  --  47.3  --  37.2*  PLT 253  --  257  --  246  CREATININE 2.33*  --  2.12*  --  2.09*  TROPONINIHS  --    < > 40* 90* 107*   < > = values in this interval not displayed.    CrCl cannot be calculated (Unknown ideal weight.).   Medical History: Past Medical History:  Diagnosis Date  . Diabetes mellitus   . Gastroparesis due to DM ([place])   . Hypertension   . Neuropathy of lower extremity   . Pancreatitis 06/2012    Assessment: 48 y/o M with shortness of breath, chest pain, abdominal pain. D-dimer slightly elevated. Starting heparin until PE can be ruled out. CBC good. Acute renal failure.   Goal of Therapy:  Heparin level 0.3-0.7 units/ml Monitor platelets by anticoagulation protocol: Yes   Plan:  Heparin 5000 units BOLUS Start heparin drip at 1000 units/hr 1300 HL Daily CBC/HL Monitor for bleeding  Narda Bonds, PharmD, BCPS Clinical Pharmacist Phone: [phone]

## 2019-02-13 NOTE — ED Notes (Signed)
Breakfast ordered 

## 2019-02-13 NOTE — ED Notes (Signed)
Pt to nuc med

## 2019-02-14 ENCOUNTER — Inpatient Hospital Stay (HOSPITAL_COMMUNITY): Payer: Self-pay

## 2019-02-14 LAB — BASIC METABOLIC PANEL
Anion gap: 9 (ref 5–15)
BUN: 40 mg/dL — ABNORMAL HIGH (ref 6–20)
CO2: 21 mmol/L — ABNORMAL LOW (ref 22–32)
Calcium: 8.4 mg/dL — ABNORMAL LOW (ref 8.9–10.3)
Chloride: 106 mmol/L (ref 98–111)
Creatinine, Ser: 2.32 mg/dL — ABNORMAL HIGH (ref 0.61–1.24)
GFR calc Af Amer: 38 mL/min — ABNORMAL LOW (ref >60)
GFR calc non Af Amer: 32 mL/min — ABNORMAL LOW (ref >60)
Glucose, Bld: 187 mg/dL — ABNORMAL HIGH (ref 70–99)
Potassium: 3.8 mmol/L (ref 3.5–5.1)
Sodium: 136 mmol/L (ref 135–145)

## 2019-02-14 LAB — GLUCOSE, CAPILLARY
Glucose-Capillary: 137 mg/dL — ABNORMAL HIGH (ref 70–99)
Glucose-Capillary: 214 mg/dL — ABNORMAL HIGH (ref 70–99)
Glucose-Capillary: 220 mg/dL — ABNORMAL HIGH (ref 70–99)
Glucose-Capillary: 371 mg/dL — ABNORMAL HIGH (ref 70–99)
Glucose-Capillary: 403 mg/dL — ABNORMAL HIGH (ref 70–99)

## 2019-02-14 LAB — CREATININE, URINE, RANDOM: Creatinine, Urine: 19.61 mg/dL

## 2019-02-14 MED ORDER — INSULIN ASPART 100 UNIT/ML ~~LOC~~ SOLN
15.0000 [IU] | Freq: Once | SUBCUTANEOUS | Status: AC
  Administered 2019-02-14: 15 [IU] via SUBCUTANEOUS

## 2019-02-14 MED ORDER — FUROSEMIDE 10 MG/ML IJ SOLN
120.0000 mg | Freq: Once | INTRAVENOUS | Status: AC
  Administered 2019-02-14: 120 mg via INTRAVENOUS
  Filled 2019-02-14: qty 10

## 2019-02-14 MED ORDER — RAMELTEON 8 MG PO TABS
8.0000 mg | ORAL_TABLET | Freq: Every day | ORAL | Status: DC
  Administered 2019-02-14 – 2019-02-19 (×6): 8 mg via ORAL
  Filled 2019-02-14 (×7): qty 1

## 2019-02-14 MED ORDER — INSULIN GLARGINE 100 UNIT/ML ~~LOC~~ SOLN
10.0000 [IU] | Freq: Every day | SUBCUTANEOUS | Status: DC
  Administered 2019-02-15: 10 [IU] via SUBCUTANEOUS
  Filled 2019-02-14: qty 0.1

## 2019-02-14 MED ORDER — INSULIN ASPART 100 UNIT/ML ~~LOC~~ SOLN
0.0000 [IU] | Freq: Three times a day (TID) | SUBCUTANEOUS | Status: DC
  Administered 2019-02-14 (×2): 5 [IU] via SUBCUTANEOUS
  Administered 2019-02-14: 2 [IU] via SUBCUTANEOUS
  Administered 2019-02-15: 09:00:00 5 [IU] via SUBCUTANEOUS
  Administered 2019-02-15: 3 [IU] via SUBCUTANEOUS
  Administered 2019-02-15: 8 [IU] via SUBCUTANEOUS
  Administered 2019-02-16: 17:00:00 11 [IU] via SUBCUTANEOUS
  Administered 2019-02-16: 3 [IU] via SUBCUTANEOUS
  Administered 2019-02-16 – 2019-02-17 (×2): 2 [IU] via SUBCUTANEOUS

## 2019-02-14 NOTE — Evaluation (Addendum)
Physical Therapy Evaluation Patient Details Name: Christopher Davila MRN: [id number] DOB: 1971/04/12 Today's Date: 02/14/2019   History of Present Illness  Pt is a 48 y.o. M presenting with dyspnea, abdominal, chest pain. Significant PMH of T2 DM, HTN, ETOH use. Chest x-ray with bilateral pleural effusions.   Clinical Impression  Patient evaluated by Physical Therapy with no further acute PT needs identified. Reporting dyspnea on exertion and upper epigastric "pressure." Pt ambulating 400 feet independently, SpO2 96% on RA, HR 95-103 bpm. Encouraged continued activity during inpatient stay. All education has been completed and the patient has no further questions. No follow-up Physical Therapy or equipment needs. PT is signing off. Thank you for this referral.     Follow Up Recommendations No PT follow up    Equipment Recommendations  None recommended by PT    Recommendations for Other Services       Precautions / Restrictions Precautions Precautions: None Restrictions Weight Bearing Restrictions: No      Mobility  Bed Mobility               General bed mobility comments: Standing up in room upon arrival  Transfers Overall transfer level: Independent Equipment used: None                Ambulation/Gait Ambulation/Gait assistance: Independent Gait Distance (Feet): 400 Feet Assistive device: None Gait Pattern/deviations: WFL(Within Functional Limits)     General Gait Details: No evidence of gross instability, good posture  Stairs            Wheelchair Mobility    Modified Rankin (Stroke Patients Only)       Balance Overall balance assessment: Independent                                           Pertinent Vitals/Pain Pain Assessment: Faces Faces Pain Scale: Hurts a little bit Pain Location: epigastric pain Pain Descriptors / Indicators: Pressure Pain Intervention(s): Monitored during session    Home Living Family/patient  expects to be discharged to:: Private residence Living Arrangements: Other (Comment)(step dad) Available Help at Discharge: Family Type of Home: Apartment Home Access: Stairs to enter   Technical brewer of Steps: 3          Prior Function Level of Independence: Independent         Comments: Works as Biomedical scientist at [employer]        Extremity/Trunk Assessment   Upper Extremity Assessment Upper Extremity Assessment: Overall WFL for tasks assessed    Lower Extremity Assessment Lower Extremity Assessment: Overall WFL for tasks assessed    Cervical / Trunk Assessment Cervical / Trunk Assessment: Normal  Communication   Communication: No difficulties  Cognition Arousal/Alertness: Awake/alert Behavior During Therapy: WFL for tasks assessed/performed Overall Cognitive Status: Within Functional Limits for tasks assessed                                        General Comments      Exercises     Assessment/Plan    PT Assessment Patent does not need any further PT services  PT Problem List Decreased activity tolerance       PT Treatment Interventions      PT Goals (Current goals can be found in the  Care Plan section)  Acute Rehab PT Goals Patient Stated Goal: none stated PT Goal Formulation: All assessment and education complete, DC therapy    Frequency     Barriers to discharge        Co-evaluation               AM-PAC PT "6 Clicks" Mobility  Outcome Measure Help needed turning from your back to your side while in a flat bed without using bedrails?: None Help needed moving from lying on your back to sitting on the side of a flat bed without using bedrails?: None Help needed moving to and from a bed to a chair (including a wheelchair)?: None Help needed standing up from a chair using your arms (e.g., wheelchair or bedside chair)?: None Help needed to walk in hospital room?: None Help needed climbing 3-5  steps with a railing? : None 6 Click Score: 24    End of Session   Activity Tolerance: Patient tolerated treatment well Patient left: in chair Nurse Communication: Mobility status PT Visit Diagnosis: Difficulty in walking, not elsewhere classified (R26.2)    Time: [id number] PT Time Calculation (min) (ACUTE ONLY): 10 min   Charges:   PT Evaluation $PT Eval Moderate Complexity: 1 Mod          Ellamae Sia, PT, DPT Acute Rehabilitation Services Pager [phone] Office [phone]   Willy Eddy 02/14/2019, 4:47 PM

## 2019-02-14 NOTE — Progress Notes (Signed)
Family Medicine Teaching Service Daily Progress Note Intern Pager: [phone]  Patient name: Christopher Davila record number: [id number] Date of birth: August 26, 1970 Age: 48 y.o. Gender: male  Primary Care Provider: Marliss Coots, NP Consultants: None Code Status: FULL  Pt Overview and Major Events to Date:  08/28: Admitted for dyspnea 08/29: cardiology curb-sided and do not believe this is ACS  Assessment and Plan: Christopher Davila is a 48 y.o. male presenting with dyspnea. PMH is significant for T2 DM, HTN, reports fatty liver, HLD, EtOH use.  Dyspnea with new O2 requirement Patient satting 99% on room air.  Not much pit after receiving Lasix 120 mg IV total.  Weight remains stable at 154 pounds. Chest x-ray with bilateral pleural effusions. VQ scan with no signs of PE.  Patient started on prednisone 40 mg and azithromycin on 8/28 by PCP. Continued here.  Patient with no history of asthma or COPD and no oxygen requirement at baseline.  However does have extensive smoking history.  Morning he reports that he is having continued shortness of breath that is worse when sitting up and on exertion. -Continuous O2 monitoring -Continue albuterol and ipratropium nebulizers every 4 hours -Continue azithromycin,(8/28-9/1) to complete 5 day course.  -Continue prednisone 40 mg daily, (8/28-9/1) to complete 5 day course -PT/OT to eval and treat  AKI with edema Patient's creatinine on admission was 2.33, now 2.32. atient's baseline runs approximately 1.5. UA with >300 protein, glucose and blood with 6-10 RBCs seen on micro. s/p 40 and 80 mg IV lasix, after receiving 2 L in ED. -120 mg IV Lasix given patient reported no increase in output after previous doses, sodium restriction diet -Hold patient's lisinopril and HCTZ in setting of AKI -Renal US and FEUrea  -Obtain 24-hour protein urine -Consider nephrology consult if no improvement noted -Trend BMP / urinary output -Replace electrolytes as  indicated -Avoid nephrotoxic agents, ensure adequate renal perfusion  Elevated troponins   Lower extremity edema BNP elevated to 567.4.  Echo with EF of 55 to 60% with pseudonormalization noted.  EKG with tachycardia and inferior T wave abnormality that was previously seen. Troponin 40>90>107> 83>83.  Spoke with cardiologist, Dr. Rayann Heman over the phone on 8/30 and reports that given he has just had a VQ scan he would not be eligible for stress test at this time but he believes that this is most likely related to kidney injury.  States that he may need follow-up outpatient for a stress test but he does not believe that he needs to be followed here in the hospital.  Reports that if he does not improve or does worsen significantly than we may consult them at that time. -Daily weights, strict I's and O's -Elevate legs  Abdominal pain, improved Patient reports that his abdominal pain has improved along with his shortness of breath. KUB was normal. Abdominal xray was normal. Possible GERD. -Tylenol 679m q6h - Protonix 80 mg daily - Monitor with serial abdominal exams - Consider CT scan with worsening.  Diabetes type 2 with neuropathy Hemoglobin A1c is 8.3 home medication includes metformin 500 mg twice daily. CBGs have recently ranged from 187-429.  Likely worsening in setting of steroid burst - Monitor CBGs - increase Lantus 10 units - mSSI -lotrimin cream for toe rash on feet  HTN Blood pressures since admission have ranged from 140-166/ 94-114. Home medications include Norvasc 10 mg, Zestoretic 20-12.5 daily, metoprolol 50 mg twice daily.  On admission patient received 2 L boluses of fluid which  is could be contributing to patient's hypertension.  - Monitor blood pressures, likely to improve with diuresis - Continue amlodipine, metoprolol  - Hold HCTZ/lisinopril in setting of AKI  Reported fatty liver Admission labs showed albumin AST-20, ALT-14, lipase-34, alk phos- 68.  Albumin 2.2.    HLD Home medications include Lipitor 40 mg daily. Total chol 244. Triglycerides 182. LDL 136. - Continue home Lipitor dose  Alcohol abuse Patient drinks anywhere from 2 beers to 12 pack on a given day.  Also has a history of alcoholic pancreatitis, and alcoholic gastritis and has been seen in ED for this multiple times.  Reports never being admitted to the hospital for alcohol withdrawal.  Does not display any symptoms of active withdrawal at this time. -Monitor CIWA scores without Ativan -CIWA scores, 0  FEN/GI: Carb modified, 2 g sodium diet Prophylaxis: Lovenox 40 mg daily   Disposition: Pending improved respiratory status and AKI  Subjective:  Patient reports that he feels a little bit better today.  He states that his shortness of breath is worse when he is sitting up and better when he lays down flat.  Patient states that he has had leg swelling for the past year that he wears compression stockings for and it tends to fluctuate however he believes it is worse here.  He states that it does not look any better today.  He states that he has been peeing a normal amount and has not noticed an increase in his urine even after receiving the Lasix through his IV.   Dates he has had chest pain before as needed on admission however this episode has seemed quite different.  He states that he has never really had the associated shortness of breath when he has previously been admitted in 2015 in 2016 for chest pain.  Objective: Temp:  [97.9 F (36.6 C)-98.9 F (37.2 C)] 98.1 F (36.7 C) (08/30 0742) Pulse Rate:  [87-105] 92 (08/30 1121) Resp:  [18] 18 (08/30 0518) BP: (138-157)/(94-114) 149/103 (08/30 1121) SpO2:  [94 %-99 %] 94 % (08/30 1121) Weight:  [70.1 kg] 70.1 kg (08/30 0518)   Physical Exam: General: NAD, pleasant, sitting up in bed Cardiovascular: RRR, no m/r/g, 2+ pitting edema to the knees Respiratory: Crackles in bilateral lung bases, normal work of breathing-increased  after sitting up in bed for a while talking Gastrointestinal: soft, nontender, nondistended MSK: moves 4 extremities equally Derm: no rashes appreciated Neuro: CN II-XII grossly intact Psych: AOx3, appropriate affect  Laboratory: Recent Labs  Lab 02/12/19 1154 02/12/19 2238 02/13/19 0309  WBC 12.3* 16.8* 14.5*  HGB 13.1 15.5 12.6*  HCT 38.4* 47.3 37.2*  PLT 253 257 246   Recent Labs  Lab 02/12/19 1154 02/12/19 2238 02/13/19 0309 02/14/19 0337  NA 133*  --  137 136  K 3.2*  --  3.2* 3.8  CL 100  --  105 106  CO2 20*  --  21* 21*  BUN 30*  --  30* 40*  CREATININE 2.33* 2.12* 2.09* 2.32*  CALCIUM 8.4*  --  8.2* 8.4*  PROT 6.5  --   --   --   BILITOT 0.8  --   --   --   ALKPHOS 68  --   --   --   ALT 14  --   --   --   AST 20  --   --   --   GLUCOSE 446*  --  262* 187*   Lipid panel [[id number]] (Abnormal)   Collected: 02/12/19 2238  Specimen: Blood from Hand Updated: 02/12/19 2338   Cholesterol 244High  mg/dL    Triglycerides 182High  mg/dL    HDL 72 mg/dL    Total CHOL/HDL Ratio 3.4 RATIO    VLDL 36 mg/dL    LDL Cholesterol 136High  mg/dL    Hemoglobin A1c [[id number]] (Abnormal) Collected: 02/12/19 2238  Specimen: Blood from Hand Updated: 02/12/19 2314   Hgb A1c MFr Bld 8.3High  %      Imaging/Diagnostic Tests: CHEST - 2 VIEW COMPARISON:  PA and lateral chest 06/18/2018. Lungs are clear. There are very small bilateral pleural effusions. Heart size is normal. No pneumothorax. No acute or focal bony abnormality. IMPRESSION: Small bilateral pleural effusions.  Otherwise negative.   PORTABLE CHEST 1 VIEW COMPARISON:  Chest radiograph dated 02/12/2019 and CT dated 12/21/2007. IMPRESSION: 1. No focal consolidation. 2. No evidence of bowel obstruction.  PORTABLE CHEST 1 VIEW COMPARISON:  Chest radiograph dated 02/12/2019 and CT dated 12/21/2007. IMPRESSION: 1. No focal consolidation. 2. No evidence of bowel obstruction.  NUCLEAR MEDICINE PERFUSION  LUNG SCAN COMPARISON:  Chest radiograph 02/12/2019 IMPRESSION: No evidence of pulmonary embolism.   Natale Barba, [place], DO 02/14/2019, 1:48 PM PGY-3, [place] Intern pager: [phone], text pages welcome

## 2019-02-14 NOTE — Progress Notes (Signed)
OT Screen Note  Patient Details Name: Demaje Cuartas MRN: [id number] DOB: [birth year]   Cancelled Treatment:    Reason Eval/Treat Not Completed: Per PT, pt at baseline for functional mobility. Pt reporting he feels at baseline for ADLs. Reports no needs for DME. OT screened, no needs identified, will sign off.   [place], OTR/L Acute Rehab Pager: [phone] Office: [phone] 02/14/2019, 2:53 PM

## 2019-02-15 LAB — GLUCOSE, CAPILLARY
Glucose-Capillary: 256 mg/dL — ABNORMAL HIGH (ref 70–99)
Glucose-Capillary: 211 mg/dL — ABNORMAL HIGH (ref 70–99)
Glucose-Capillary: 156 mg/dL — ABNORMAL HIGH (ref 70–99)
Glucose-Capillary: 263 mg/dL — ABNORMAL HIGH (ref 70–99)
Glucose-Capillary: 438 mg/dL — ABNORMAL HIGH (ref 70–99)

## 2019-02-15 LAB — PROTEIN, URINE, 24 HOUR
Collection Interval-UPROT: 24 hours
Protein, 24H Urine: 10560 mg/d — ABNORMAL HIGH (ref 50–100)
Protein, Urine: 440 mg/dL
Urine Total Volume-UPROT: 2400 mL

## 2019-02-15 LAB — CBC
HCT: 37.6 % — ABNORMAL LOW (ref 39.0–52.0)
Hemoglobin: 12.6 g/dL — ABNORMAL LOW (ref 13.0–17.0)
MCH: 29.5 pg (ref 26.0–34.0)
MCHC: 33.5 g/dL (ref 30.0–36.0)
MCV: 88.1 fL (ref 80.0–100.0)
Platelets: 266 10*3/uL (ref 150–400)
RBC: 4.27 MIL/uL (ref 4.22–5.81)
RDW: 11.9 % (ref 11.5–15.5)
WBC: 13.7 10*3/uL — ABNORMAL HIGH (ref 4.0–10.5)
nRBC: 0 % (ref 0.0–0.2)

## 2019-02-15 LAB — RENAL FUNCTION PANEL
Albumin: 2.1 g/dL — ABNORMAL LOW (ref 3.5–5.0)
Anion gap: 10 (ref 5–15)
BUN: 45 mg/dL — ABNORMAL HIGH (ref 6–20)
CO2: 21 mmol/L — ABNORMAL LOW (ref 22–32)
Calcium: 8.5 mg/dL — ABNORMAL LOW (ref 8.9–10.3)
Chloride: 104 mmol/L (ref 98–111)
Creatinine, Ser: 2.31 mg/dL — ABNORMAL HIGH (ref 0.61–1.24)
GFR calc Af Amer: 38 mL/min — ABNORMAL LOW (ref >60)
GFR calc non Af Amer: 33 mL/min — ABNORMAL LOW (ref >60)
Glucose, Bld: 117 mg/dL — ABNORMAL HIGH (ref 70–99)
Phosphorus: 4.1 mg/dL (ref 2.5–4.6)
Potassium: 3.2 mmol/L — ABNORMAL LOW (ref 3.5–5.1)
Sodium: 135 mmol/L (ref 135–145)

## 2019-02-15 LAB — MAGNESIUM: Magnesium: 1.8 mg/dL (ref 1.7–2.4)

## 2019-02-15 LAB — PROTEIN / CREATININE RATIO, URINE
Creatinine, Urine: 96.46 mg/dL
Protein Creatinine Ratio: 7.63 mg/mg{Cre} — ABNORMAL HIGH (ref 0.00–0.15)
Total Protein, Urine: 736 mg/dL

## 2019-02-15 LAB — UREA NITROGEN, URINE: Urea Nitrogen, Ur: 156 mg/dL

## 2019-02-15 MED ORDER — FUROSEMIDE 10 MG/ML IJ SOLN
120.0000 mg | Freq: Once | INTRAVENOUS | Status: AC
  Administered 2019-02-15: 120 mg via INTRAVENOUS
  Filled 2019-02-15: qty 10

## 2019-02-15 MED ORDER — AZITHROMYCIN 250 MG PO TABS
250.0000 mg | ORAL_TABLET | Freq: Every day | ORAL | Status: AC
  Administered 2019-02-16: 09:00:00 250 mg via ORAL
  Filled 2019-02-15: qty 1

## 2019-02-15 MED ORDER — INSULIN ASPART 100 UNIT/ML ~~LOC~~ SOLN
15.0000 [IU] | Freq: Once | SUBCUTANEOUS | Status: AC
  Administered 2019-02-15: 15 [IU] via SUBCUTANEOUS

## 2019-02-15 MED ORDER — INSULIN GLARGINE 100 UNIT/ML ~~LOC~~ SOLN
15.0000 [IU] | Freq: Every day | SUBCUTANEOUS | Status: DC
  Administered 2019-02-16 – 2019-02-20 (×4): 15 [IU] via SUBCUTANEOUS
  Filled 2019-02-15 (×5): qty 0.15

## 2019-02-15 MED ORDER — PREDNISONE 20 MG PO TABS
40.0000 mg | ORAL_TABLET | Freq: Every day | ORAL | Status: AC
  Administered 2019-02-16: 40 mg via ORAL
  Filled 2019-02-15: qty 2

## 2019-02-15 MED ORDER — POTASSIUM CHLORIDE CRYS ER 20 MEQ PO TBCR
40.0000 meq | EXTENDED_RELEASE_TABLET | ORAL | Status: AC
  Administered 2019-02-15 (×2): 40 meq via ORAL
  Filled 2019-02-15 (×2): qty 2

## 2019-02-15 NOTE — TOC Initial Note (Signed)
Transition of Care [facility]) - Initial/Assessment Note    Patient Details  Name: Christopher Davila MRN: [id number] Date of Birth: 09-03-1970  Transition of Care [facility]) CM/SW Contact:    Sherrilyn Rist Advance Care Supervisor Phone Number: [phone] 02/15/2019, 11:06 AM  Clinical Narrative:                 CM talked to patient at the bedside; pt lives at home with his parent; PCP - he goes to the [facility] ) for medical care and gets his medication there also; his Father takes him to his apts; CM also talked to him about decreasing his Alcohol consumption; he stated that he will work on this; he is agreeable to having a referral placed to [facility] for additional services that he might need; Attending MD, at discharge, please send prescriptions to [facility]. CM will continue to follow for progression of care  Expected Discharge Plan: Home/Self Care Barriers to Discharge: No Barriers Identified   Patient Goals and CMS Choice Patient states their goals for this hospitalization and ongoing recovery are:: to get better   Choice offered to / list presented to : NA  Expected Discharge Plan and Services Expected Discharge Plan: Home/Self Care In-house Referral: NA Discharge Planning Services: CM Consult Post Acute Care Choice: NA Living arrangements for the past 2 months: Single Family Home                           HH Arranged: NA          Prior Living Arrangements/Services Living arrangements for the past 2 months: Single Family Home Lives with:: Parents   Do you feel safe going back to the place where you live?: Yes      Need for Family Participation in Patient Care: No (Comment) Care giver support system in place?: Yes (comment)   Criminal Activity/Legal Involvement Pertinent to Current Situation/Hospitalization: No - Comment as needed  Activities of Daily Living      Permission Sought/Granted Permission sought to share  information with : Case Manager Permission granted to share information with : Yes, Verbal Permission Granted     Permission granted to share info w AGENCY: [facility]        Emotional Assessment Appearance:: Appears stated age Attitude/Demeanor/Rapport: Gracious Affect (typically observed): Accepting Orientation: : Oriented to Self, Oriented to Place, Oriented to  Time, Oriented to Situation Alcohol / Substance Use: Alcohol Use Psych Involvement: No (comment)  Admission diagnosis:  Wheezing [R06.2] Epigastric abdominal pain [R10.13] SOB (shortness of breath) [R06.02] Hyperglycemia [R73.9] Hypoxia [R09.02] Chronic alcohol use [Z72.89] Acute renal failure superimposed on chronic kidney disease, unspecified CKD stage, unspecified acute renal failure type ([place]) [N17.9, N18.9] Heart failure ([place]) [I50.9] Patient Active Problem List   Diagnosis Date Noted  . Heart failure ([place]) 02/13/2019  . Epigastric abdominal pain   . Hyperglycemia   . Hypoxia   . Wheezing   . Acute renal failure superimposed on chronic kidney disease ([place])   . Chronic alcohol use   . Gastroparesis due to DM ([place])   . Essential hypertension   . Nausea   . SOB (shortness of breath)   . Hyperlipidemia   . Chest pain 02/14/2014  . Abdominal pain 12/28/2012  . Diabetes ([place]) 06/26/2012  . Hypertension 06/26/2012  . Pancreatitis 06/26/2012  . Weight loss 06/26/2012  . Oral candidiasis 06/26/2012   PCP:  Marliss Coots, NP  Pharmacy:   [place], [place] [address] Phone: [phone] Fax: [phone]     Social Determinants of Health (SDOH) Interventions    Readmission Risk Interventions No flowsheet data found.

## 2019-02-15 NOTE — Discharge Summary (Signed)
[facility] Discharge Summary  Patient name: Christopher Davila record number: [id number] Date of birth: 1970/07/14 Age: 48 y.o. Gender: male Date of Admission: 02/12/2019  Date of Discharge: 02/20/19 Admitting Physician: Gifford Shave, MD  Primary Care Provider: Synthia Innocent Audrea Muscat, NP Consultants: Nephrology, IR  Indication for Hospitalization: dyspnea 2/2 to edema  Discharge Diagnoses/Problem List:  AKI with nephrotic syndrome DMT2 HTN HLD NASH EtOH SUD  Disposition: to home  Discharge Condition: improved  Discharge Exam:   General: Appears well, no acute distress. Age appropriate. Cardiac: RRR, normal heart sounds, no murmurs Respiratory: CTAB, normal effort Abdomen: soft, nontender, nondistended Extremities: 2+ pitting edema up to calf but improved from admission. No cyanosis. Skin: Warm and dry, no rashes noted Neuro: alert and oriented, no focal deficits Psych: normal affect  Brief Hospital Course:  Patient was admitted on 8/28 for dyspnea, abdominal pain, cough in the setting of significant LE edema pitting 2+ to hip bilaterally.  Chest x-ray showed small bilateral pleural effusions while in the ED. an AKI was also noted with creatinine on admission to 2.33.  Patient's baseline historically found to be at about 1.5.  UA revealed greater than 300 protein. Initially there was concern for new onset CHF versus PE versus ACS because BNP was elevated to 567, d-dimer elevated, and troponins were elevated. However, echo revealed normal ejection fraction of 55 to 60%, V/Q scan was clear, and troponins stabilized.  Cardiology was consulted who did not believe that the symptoms were due to a cardiac etiology.  Concern for nephrotic syndrome was high given low albumin, high urine protein, renal failure, and volume overload. Consequently, renal work-up began which included a renal ultrasound, FE urea, urine protein to creatinine ratio, 24-hour protein urine,  sodium restriction diet added, lisinopril and HCTZ held.  Nephrology was consulted.  Renal ultrasound revealed no obstruction, but kidney appeared echogenic, FE urea was 45.9%,  24-hour protein urine was above 10 g, in the setting of chronic hematuria, hypoalbuminemia, hypokalemia, all confirming a nephrotic syndrome.  Secondary causes for nephrotic syndrome were investigated, hepatitis B, hepatitis C, ANA, ANCA, complements, free light chain all ruled out as possible causes.  Etiology thought to be likely due to to uncontrolled diabetic nephropathy also with hypertension.  Renal biopsy was conducted on September 3.  Nephrology will follow up in the outpatient setting with results. For the dyspnea and lower extremity edema, patient responded to 120 mg of IV furosemide.  Nephrology later recommended this should be changed to 40 mg of IV furosemide, which he continued on.  He was discharged on 80 mg furosemide p.o. twice daily. At discharge he lost 5 pounds, with a new dry weight of 148.9 lbs. Lower extremity edema was decreased from the hips at admission to mid calf at discharge.  Dyspnea was completely resolved.  Christopher Davila has a history of DMT2, and has been on lantus and SSI in the hospital, but it is unclear if he has had insulin in the outpatient setting. Due to the holiday weekend and constraints of access to medications while in the hospital (Christopher Davila does not have insurance, and only had a limited amount of funds on his person, but can get prescriptions at reduced cost through the [facility]), we did not prescribe lantus to him as a discharge medication. We strongly recommend that his PCP follow his glucose closely. His BGs in the hospital were initially >350, but with 15U lantus, he was reasonably well controled in  the 100-250 zone.   Christopher Davila blood pressure was also very high during this admission. On a regimen of amlodipine 10 mg qd, carvedilol 12.5 mg BID, and furosemide  80 mg BID, he still had BP 160s/100s. Lisinopril and HCTZ were held secondary to AKI and nephrotic syndrome. Hydralazine 10 mg q3  was added as well as carvedilol was increased to 25 mg BID, and he was discharged with a blood pressure in the 140s/90s.   Issues for Follow Up:  1. Check blood glucose and consider starting glargine with diabetic education. 2. Watch for resolving AKI. When cr back to baseline (1.1), can consider restarting metformin and ACE/ARB. 3. Even after impressive diuresis, BP remained elevated to 160s/110s. Discharge regimen is coreg 25 mg BID, amlodipine 10 mg, furosemide 80 mg BID, hydralazine 10 mg TID.  4. Nephrology to follow up on renal biopsy results and next steps 5. Patient has ethanol SUD, consider counseling and support for abstinence/moderation.  Significant Procedures: renal biopsy  Significant Labs and Imaging:  Recent Labs  Lab 02/18/19 0342 02/19/19 0806 02/20/19 0735  WBC 11.6* 10.8* 10.3  HGB 12.3* 13.3 12.8*  HCT 36.1* 40.1 38.1*  PLT 242 248 245   Recent Labs  Lab 02/15/19 0502 02/16/19 0357 02/17/19 0349 02/18/19 0342 02/19/19 0353 02/20/19 0735  NA 135 134* 135 135 135 134*  K 3.2* 3.5 3.9 3.6 3.8 4.0  CL 104 102 103 101 101 99  CO2 21* 22 21* 23 25 25   GLUCOSE 117* 110* 144* 186* 154* 173*  BUN 45* 46* 42* 41* 34* 31*  CREATININE 2.31* 2.36* 2.19* 2.30* 2.13* 2.14*  CALCIUM 8.5* 8.6* 8.6* 8.3* 8.3* 8.6*  MG 1.8  --   --   --   --   --   PHOS 4.1 4.4 3.9 4.8* 4.1 4.5  ALBUMIN 2.1* 2.3* 2.2* 2.0*  --  2.4*    Results/Tests Pending at Time of Discharge: none  Discharge Medications:  Allergies as of 02/20/2019      Reactions   Morphine And Related Itching, Other (See Comments)   Pt prefers not to be given this drug      Medication List    STOP taking these medications   azithromycin 250 MG tablet Commonly known as: ZITHROMAX   gabapentin 800 MG tablet Commonly known as: NEURONTIN Replaced by: gabapentin 300 MG capsule    guaiFENesin-codeine 100-10 MG/5ML syrup Commonly known as: ROBITUSSIN AC   lisinopril-hydrochlorothiazide 20-12.5 MG tablet Commonly known as: ZESTORETIC   metFORMIN 500 MG tablet Commonly known as: GLUCOPHAGE   metoprolol tartrate 50 MG tablet Commonly known as: LOPRESSOR   ondansetron 4 MG tablet Commonly known as: ZOFRAN   predniSONE 20 MG tablet Commonly known as: DELTASONE   traMADol 50 MG tablet Commonly known as: ULTRAM     TAKE these medications   amLODipine 10 MG tablet Commonly known as: NORVASC Take 10 mg by mouth daily.   aspirin EC 81 MG tablet Take 1 tablet (81 mg total) by mouth daily.   atorvastatin 80 MG tablet Commonly known as: Lipitor Take 1 tablet (80 mg total) by mouth daily. What changed:   medication strength  how much to take   carvedilol 25 MG tablet Commonly known as: COREG Take 1 tablet (25 mg total) by mouth 2 (two) times daily with a meal.   clotrimazole 1 % cream Commonly known as: LOTRIMIN Apply topically 2 (two) times daily.   furosemide 80 MG tablet Commonly known as: LASIX  Take 1 tablet (80 mg total) by mouth 2 (two) times daily.   gabapentin 300 MG capsule Commonly known as: NEURONTIN Take 1 capsule (300 mg total) by mouth 3 (three) times daily. Replaces: gabapentin 800 MG tablet   hydrALAZINE 10 MG tablet Commonly known as: APRESOLINE Take 1 tablet (10 mg total) by mouth 3 (three) times daily.   pantoprazole 40 MG tablet Commonly known as: PROTONIX Take 2 tablets (80 mg total) by mouth daily. What changed:   medication strength  how much to take   potassium chloride SA 20 MEQ tablet Commonly known as: K-DUR Take 1 tablet (20 mEq total) by mouth daily.   sucralfate 1 g tablet Commonly known as: Carafate Take 1 tablet (1 g total) by mouth 4 (four) times daily -  with meals and at bedtime.       Discharge Instructions: Please refer to Patient Instructions section of EMR for full details.  Patient was  counseled important signs and symptoms that should prompt return to medical care, changes in medications, dietary instructions, activity restrictions, and follow up appointments.   Follow-Up Appointments: Follow-up Information    Placey, Audrea Muscat, NP In 3 days.   Contact information: [place] [phone]        [facility] EMERGENCY DEPARTMENT.   Specialty: Emergency Medicine Why: If symptoms worsen Contact information: [address] [id number] [place]          Gladys Damme, MD 02/21/2019, 2:45 PM PGY-1, [place]

## 2019-02-15 NOTE — Consult Note (Signed)
[facility] Nephrology Consultation Note  Requesting MD: Dr Ardelia Mems, Marye Round Reason for consult: AKI and proteinuria  HPI:  Christopher Davila is a 48 y.o. male with history of hypertension, diabetes with A1c 8.3-9, alcohol abuse with fatty liver, neuropathy, pancreatitis, admitted with chest pain, shortness of breath and worsening lower extremity edema.  On admission it was thought to be due to CHF exacerbation and treated with IV Lasix.  Echocardiogram with normal systolic function with EF 55 to 60%.  He has CKD with baseline creatinine level around 1.5-1.7 however on admission the creatinine level was elevated to 2.33.  Today the creatinine level remains stable at 2.31 however urinalysis with nephrotic range proteinuria and hematuria.  Patient was on lisinopril and hydrochlorothiazide at home which was held on admission.  The US showed 24-hour protein of 10.5 g, has 6-10 RBCs and elevated glucose level.  The kidney ultrasound with echogenic kidney with no hydronephrosis. Currently he is receiving Lasix 120 mg IV for his lower extremity edema.  Patient reported that his shortness of breath is improving and has no chest pain today.  The VQ scan was negative for PE. Currently receiving treatment with prednisone, bronchodilators ligation for his breathing. He denies dysuria, urgency, frequency.  Did not notice decreased amount of urine output.  Denies use of NSAIDs.  No recent IV contrast.  He denies any small joint pain or any skin rash.  No recent sore throat.  His sister is on dialysis, per patient the cause of ESRD was " street related drugs".  PMHx:   Past Medical History:  Diagnosis Date  . Diabetes mellitus   . Gastroparesis due to DM ([place])   . Hypertension   . Neuropathy of lower extremity   . Pancreatitis 06/2012    History reviewed. No pertinent surgical history.  Family Hx:  Family History  Problem Relation Age of Onset  . Diabetes Mother   . Lung disease Mother   .  Diabetes Maternal Aunt   . CAD Maternal Aunt   . CAD Cousin     Social History:  reports that he has been smoking cigarettes. He has been smoking about 0.00 packs per day. He has never used smokeless tobacco. He reports current alcohol use. He reports that he does not use drugs.  Allergies:  Allergies  Allergen Reactions  . Morphine And Related Itching and Other (See Comments)    Pt prefers not to be given this drug    Medications: Prior to Admission medications   Medication Sig Start Date End Date Taking? Authorizing Provider  amLODipine (NORVASC) 10 MG tablet Take 10 mg by mouth daily.   Yes [provider]  aspirin EC 81 MG tablet Take 1 tablet (81 mg total) by mouth daily. 05/24/15  Yes Ghimire, Henreitta Leber, MD  atorvastatin (LIPITOR) 40 MG tablet Take 1 tablet (40 mg total) by mouth daily. 02/15/14  Yes Rumley, [place] N, DO  azithromycin (ZITHROMAX) 250 MG tablet Take 250-500 mg by mouth daily. Take 2 tablets on day 1 then 1 tablet daily for days  2-5   Yes [provider]  gabapentin (NEURONTIN) 800 MG tablet Take 800 mg by mouth 3 (three) times daily.   Yes [provider]  lisinopril-hydrochlorothiazide (PRINZIDE,ZESTORETIC) 20-12.5 MG per tablet Take 1 tablet by mouth daily.   Yes [provider]  metFORMIN (GLUCOPHAGE) 500 MG tablet Take 1 tablet (500 mg total) by mouth 2 (two) times daily with a meal. 07/27/14  Yes Tawanna Sat  M, MD  metoprolol (LOPRESSOR) 50 MG tablet Take 50 mg by mouth 2 (two) times daily.   Yes [provider]  pantoprazole (PROTONIX) 20 MG tablet Take 1 tablet (20 mg total) by mouth daily. 02/12/19   Carlisle Cater, PA-C  sucralfate (CARAFATE) 1 g tablet Take 1 tablet (1 g total) by mouth 4 (four) times daily -  with meals and at bedtime. 02/12/19   [place] Cater, PA-C  traMADol (ULTRAM) 50 MG tablet Take 1 tablet (50 mg total) by mouth every 6 (six) hours as needed. Patient not taking: Reported on 12/22/2017 11/28/15    Virgel Manifold, MD    I have reviewed the patient's current medications.  Labs:  Results for orders placed or performed during the hospital encounter of 02/12/19 (from the past 48 hour(s))  Glucose, capillary     Status: Abnormal   Collection Time: 02/13/19  9:08 PM  Result Value Ref Range   Glucose-Capillary 429 (H) 70 - 99 mg/dL  Glucose, capillary     Status: Abnormal   Collection Time: 02/14/19 12:23 AM  Result Value Ref Range   Glucose-Capillary 371 (H) 70 - 99 mg/dL  Basic metabolic panel     Status: Abnormal   Collection Time: 02/14/19  3:37 AM  Result Value Ref Range   Sodium 136 135 - 145 mmol/L   Potassium 3.8 3.5 - 5.1 mmol/L   Chloride 106 98 - 111 mmol/L   CO2 21 (L) 22 - 32 mmol/L   Glucose, Bld 187 (H) 70 - 99 mg/dL   BUN 40 (H) 6 - 20 mg/dL   Creatinine, Ser 2.32 (H) 0.61 - 1.24 mg/dL   Calcium 8.4 (L) 8.9 - 10.3 mg/dL   GFR calc non Af Amer 32 (L) >60 mL/min   GFR calc Af Amer 38 (L) >60 mL/min   Anion gap 9 5 - 15    Comment: Performed at [facility], [address] [place], [place] [address]  Glucose, capillary     Status: Abnormal   Collection Time: 02/14/19  7:39 AM  Result Value Ref Range   Glucose-Capillary 214 (H) 70 - 99 mg/dL  Glucose, capillary     Status: Abnormal   Collection Time: 02/14/19 11:20 AM  Result Value Ref Range   Glucose-Capillary 137 (H) 70 - 99 mg/dL  Protein, urine, 24 hour     Status: Abnormal   Collection Time: 02/14/19  3:00 PM  Result Value Ref Range   Urine Total Volume-UPROT 2,400 mL   Collection Interval-UPROT 24 hours   Protein, Urine 440 mg/dL    Comment: RESULTS CONFIRMED BY MANUAL DILUTION   Protein, 24H Urine 10,560 (H) 50 - 100 mg/day    Comment: Performed at [facility], [place] [address] [place], [place] [address]  Glucose, capillary     Status: Abnormal   Collection Time: 02/14/19  4:05 PM  Result Value Ref Range   Glucose-Capillary 220 (H) 70 - 99 mg/dL  Creatinine, urine, random     Status:  None   Collection Time: 02/14/19  5:54 PM  Result Value Ref Range   Creatinine, Urine 19.61 mg/dL    Comment: Performed at [facility], [place] [address], [place], [place] [address]  Urea nitrogen, urine     Status: None   Collection Time: 02/14/19  5:55 PM  Result Value Ref Range   Urea Nitrogen, Ur 156 Not Estab. mg/dL    Comment: (NOTE) Performed At: [facility]  [place], [place] [id number] Rush Farmer MD [id number]   Glucose, capillary     Status: Abnormal   Collection Time: 02/14/19 10:00 PM  Result Value Ref Range   Glucose-Capillary 403 (H) 70 - 99 mg/dL  Glucose, capillary     Status: Abnormal   Collection Time: 02/15/19  1:50 AM  Result Value Ref Range   Glucose-Capillary 256 (H) 70 - 99 mg/dL  Renal function panel     Status: Abnormal   Collection Time: 02/15/19  5:02 AM  Result Value Ref Range   Sodium 135 135 - 145 mmol/L   Potassium 3.2 (L) 3.5 - 5.1 mmol/L   Chloride 104 98 - 111 mmol/L   CO2 21 (L) 22 - 32 mmol/L   Glucose, Bld 117 (H) 70 - 99 mg/dL   BUN 45 (H) 6 - 20 mg/dL   Creatinine, Ser 2.31 (H) 0.61 - 1.24 mg/dL   Calcium 8.5 (L) 8.9 - 10.3 mg/dL   Phosphorus 4.1 2.5 - 4.6 mg/dL   Albumin 2.1 (L) 3.5 - 5.0 g/dL   GFR calc non Af Amer 33 (L) >60 mL/min   GFR calc Af Amer 38 (L) >60 mL/min   Anion gap 10 5 - 15    Comment: Performed at [facility], [address] [place], [place] [address]  CBC     Status: Abnormal   Collection Time: 02/15/19  5:02 AM  Result Value Ref Range   WBC 13.7 (H) 4.0 - 10.5 K/uL   RBC 4.27 4.22 - 5.81 MIL/uL   Hemoglobin 12.6 (L) 13.0 - 17.0 g/dL   HCT 37.6 (L) 39.0 - 52.0 %   MCV 88.1 80.0 - 100.0 fL   MCH 29.5 26.0 - 34.0 pg   MCHC 33.5 30.0 - 36.0 g/dL   RDW 11.9 11.5 - 15.5 %   Platelets 266 150 - 400 K/uL   nRBC 0.0 0.0 - 0.2 %    Comment: Performed at [facility], [place] [address], [place], [place] [address]  Magnesium     Status: None   Collection Time: 02/15/19   5:02 AM  Result Value Ref Range   Magnesium 1.8 1.7 - 2.4 mg/dL    Comment: Performed at [facility] [address] [place], [place] [address]  Glucose, capillary     Status: Abnormal   Collection Time: 02/15/19  8:51 AM  Result Value Ref Range   Glucose-Capillary 211 (H) 70 - 99 mg/dL  Glucose, capillary     Status: Abnormal   Collection Time: 02/15/19 12:48 PM  Result Value Ref Range   Glucose-Capillary 156 (H) 70 - 99 mg/dL  Glucose, capillary     Status: Abnormal   Collection Time: 02/15/19  3:52 PM  Result Value Ref Range   Glucose-Capillary 263 (H) 70 - 99 mg/dL     ROS:  Pertinent items noted in HPI and remainder of comprehensive ROS otherwise negative.  Physical Exam: Vitals:   02/15/19 1406 02/15/19 1720  BP:    Pulse:  90  Resp:    Temp: (!) 97.5 F (36.4 C)   SpO2:       General exam: Appears calm and comfortable  Respiratory system: Clear to auscultation. Respiratory effort normal. No wheezing or crackle Cardiovascular system: S1 & S2 heard, RRR, no rubs.  Bilateral lower extremities pitting edema Gastrointestinal system: Abdomen is nondistended, soft and nontender. Normal bowel sounds heard. Central nervous system: Alert and oriented. No focal  neurological deficits. Extremities: Symmetric 5 x 5 power. Skin: No rashes, lesions or ulcers Psychiatry: Judgement and insight appear normal. Mood & affect appropriate.   Assessment/Plan:  #Acute kidney injury on CKD versus progressive CKD stage III: due to diabetic nephropathy/ACEI/HCTZ.  The creatinine level was around 1.5 in 06/2018 and now stable around 2.3.  Urinalysis with nephrotic range proteinuria.  Ultrasound kidneys showed no obstruction however has echogenic kidney.  Agree with IV Lasix today for lower extremity edema. -checking secondary work-up of proteinuria as discussed below. -Monitor BMP, urine output.  Avoid nephrotoxins including IV contrast or NSAIDs.  #Nephrotic syndrome: Patient has  10.5 g of proteinuria associated with chronic hematuria, hypoalbuminemia and lower extremity edema.  This is likely due to uncontrolled diabetic nephropathy however, I will proceed with checking other secondary causes including ANA, ANCA, complements, hepatitis B, C, free light chain. -Depending on the results he may benefit from kidney biopsy for definitive diagnosis.  I have discussed this with the patient.  She agreed with the plan. -He has elevated LDL level, on  Statin.  #Hypokalemia likely due to diuretics: Replete potassium chloride.  Monitor lab.  #Acute respiratory failure with hypoxia: May have heart failure with preserved EF.  VQ scan negative for PE.  On bronchodilators, azithromycin and prednisone per primary team.  Not on oxygen currently.  #Chest pain: Resolved now.  #Hypertension: On metoprolol and diuretics.  Holding ACE inhibitor.   Thank you for the consult.  We will continue to follow with you?    [name] 02/15/2019, 5:51 PM  [facility].

## 2019-02-15 NOTE — Progress Notes (Signed)
Family Medicine Teaching Service Daily Progress Note Intern Pager: [phone]  Patient name: Christopher Davila record number: [id number] Date of birth: February 28, 1971 Age: 48 y.o. Gender: male  Primary Care Provider: Marliss Coots, NP Consultants: None Code Status: FULL  Pt Overview and Major Events to Date:  08/28: Admitted for dyspnea 08/29: cardiology curb-sided and do not believe this is ACS  Assessment and Plan: Christopher Davila is a 48 y.o. male presenting with dyspnea. PMH is significant for T2 DM, HTN, reports fatty liver, HLD, EtOH use.  Dyspnea with new O2 requirement Patient satting 99% on room air.  Not much pit after receiving Lasix 120 mg IV total.  Weight remains stable at 154 pounds. Chest x-ray with bilateral pleural effusions. VQ scan with no signs of PE.  Patient started on prednisone 40 mg and azithromycin on 8/28 by PCP. Continued here.  Patient with no history of asthma or COPD and no oxygen requirement at baseline.  However does have extensive smoking history.  Morning he reports that he is having continued shortness of breath that is worse when sitting up and on exertion.  -Continuous O2 monitoring -Continue albuterol and ipratropium nebulizers every 4 hours -Continue azithromycin,(8/28-9/1) to complete 5 day course.  -Continue prednisone 40 mg daily, (8/28-9/1) to complete 5 day course -PT/OT to eval and treat  AKI with edema Patient's creatinine on admission was 2.33, now 2.32. atient's baseline runs approximately 1.5. UA with >300 protein, glucose and blood with 6-10 RBCs seen on micro. s/p 40 and 80 mg IV lasix, after receiving 2 L in ED. creatinine 2.31 (8/31) from 2.32 -120 mg IV Lasix given with good response.  Output not documented due to collection for 24-hour protein -Hold patient's lisinopril and HCTZ in setting of AKI -Renal US and FEUrea  -Obtain 24-hour protein urine -Consider nephrology consult if no improvement noted -Trend BMP / urinary  output -Replace electrolytes as indicated -Avoid nephrotoxic agents, ensure adequate renal perfusion  Elevated troponins   Lower extremity edema BNP elevated to 567.4.  Echo with EF of 55 to 60% with pseudonormalization noted.  EKG with tachycardia and inferior T wave abnormality that was previously seen. Troponin 40>90>107> 83>83.  Spoke with cardiologist, Dr. Rayann Heman over the phone on 8/30 and reports that given he has just had a VQ scan he would not be eligible for stress test at this time but he believes that this is most likely related to kidney injury.  States that he may need follow-up outpatient for a stress test but he does not believe that he needs to be followed here in the hospital.  Reports that if he does not improve or does worsen significantly than we may consult them at that time. -Daily weights, strict I's and O's -Elevate legs  Abdominal pain, improved Patient reports that his abdominal pain has improved along with his shortness of breath. KUB was normal. Abdominal xray was normal. Possible GERD. -Tylenol 628m q6h - Protonix 80 mg daily - Monitor with serial abdominal exams - Consider CT scan with worsening.  Diabetes type 2 with neuropathy Hemoglobin A1c is 8.3 home medication includes metformin 500 mg twice daily. CBGs have recently ranged from 187-429.  Likely worsening in setting of steroid burst - Monitor CBGs - increase Lantus 10 units - mSSI -lotrimin cream for toe rash on feet  HTN Pressures over the last 24 hours (oh 8/31) have ranged from 148/100-169/111. Home medications include Norvasc 10 mg, Zestoretic 20-12.5 daily, metoprolol 50 mg twice daily.  On admission patient received 2 L boluses of fluid which is could be contributing to patient's hypertension.  - Monitor blood pressures, likely to improve with diuresis - Continue amlodipine, metoprolol  - Hold HCTZ/lisinopril in setting of AKI  Reported fatty liver Admission labs showed albumin AST-20,  ALT-14, lipase-34, alk phos- 68.  Albumin 2.2.   HLD Home medications include Lipitor 40 mg daily. Total chol 244. Triglycerides 182. LDL 136. - Continue home Lipitor dose  Alcohol abuse Patient drinks anywhere from 2 beers to 12 pack on a given day.  Also has a history of alcoholic pancreatitis, and alcoholic gastritis and has been seen in ED for this multiple times.  Reports never being admitted to the hospital for alcohol withdrawal.  Does not display any symptoms of active withdrawal at this time. -Monitor CIWA scores without Ativan -CIWA scores, 0  FEN/GI: Carb modified, 2 g sodium diet Prophylaxis: Lovenox 40 mg daily   Disposition: Pending improved respiratory status and AKI  Subjective:  Patient doing well this morning.  Reports that his leg swelling had gone down overnight and that he had been urinating all night but this morning it had returned some.  Otherwise doing well.  Denies any shortness of breath or chest pain.  Objective: Temp:  [98.1 F (36.7 C)-98.4 F (36.9 C)] 98.1 F (36.7 C) (08/31 0447) Pulse Rate:  [87-101] 87 (08/31 0447) Resp:  [18-19] 18 (08/31 0447) BP: (148-169)/(99-114) 148/100 (08/31 0447) SpO2:  [94 %-99 %] 99 % (08/31 0447) Weight:  [70.6 kg] 70.6 kg (08/31 0447)   Physical Exam: General: NAD, resting comfortably in bed. Cardiovascular: RRR, no m/r/g, +2 pitting edema in lower extremities bilaterally Respiratory: Normal work of breathing, clear to auscultation bilaterally Gastrointestinal: soft, nontender, nondistended MSK: moves 4 extremities equally Derm: no rashes appreciated Neuro: CN II-XII grossly intact  Psych: AOx3, appropriate affect  Laboratory: Recent Labs  Lab 02/12/19 2238 02/13/19 0309 02/15/19 0502  WBC 16.8* 14.5* 13.7*  HGB 15.5 12.6* 12.6*  HCT 47.3 37.2* 37.6*  PLT 257 246 266   Recent Labs  Lab 02/12/19 1154  02/13/19 0309 02/14/19 0337 02/15/19 0502  NA 133*  --  137 136 135  K 3.2*  --  3.2* 3.8  3.2*  CL 100  --  105 106 104  CO2 20*  --  21* 21* 21*  BUN 30*  --  30* 40* 45*  CREATININE 2.33*   < > 2.09* 2.32* 2.31*  CALCIUM 8.4*  --  8.2* 8.4* 8.5*  PROT 6.5  --   --   --   --   BILITOT 0.8  --   --   --   --   ALKPHOS 68  --   --   --   --   ALT 14  --   --   --   --   AST 20  --   --   --   --   GLUCOSE 446*  --  262* 187* 117*   < > = values in this interval not displayed.   Lipid panel [[id number]] (Abnormal) Collected: 02/12/19 2238  Specimen: Blood from Hand Updated: 02/12/19 2338   Cholesterol 244High  mg/dL    Triglycerides 182High  mg/dL    HDL 72 mg/dL    Total CHOL/HDL Ratio 3.4 RATIO    VLDL 36 mg/dL    LDL Cholesterol 136High  mg/dL    Hemoglobin A1c [[id number]] (Abnormal) Collected: 02/12/19 2238  Specimen: Blood from Hand  Updated: 02/12/19 2314   Hgb A1c MFr Bld 8.3High  %      Imaging/Diagnostic Tests: CHEST - 2 VIEW COMPARISON:  PA and lateral chest 06/18/2018. Lungs are clear. There are very small bilateral pleural effusions. Heart size is normal. No pneumothorax. No acute or focal bony abnormality. IMPRESSION: Small bilateral pleural effusions.  Otherwise negative.   PORTABLE CHEST 1 VIEW COMPARISON:  Chest radiograph dated 02/12/2019 and CT dated 12/21/2007. IMPRESSION: 1. No focal consolidation. 2. No evidence of bowel obstruction.  PORTABLE CHEST 1 VIEW COMPARISON:  Chest radiograph dated 02/12/2019 and CT dated 12/21/2007. IMPRESSION: 1. No focal consolidation. 2. No evidence of bowel obstruction.  NUCLEAR MEDICINE PERFUSION LUNG SCAN COMPARISON:  Chest radiograph 02/12/2019 IMPRESSION: No evidence of pulmonary embolism.   Gifford Shave, MD 02/15/2019, 6:22 AM PGY-1, [place] Intern pager: [phone], text pages welcome

## 2019-02-16 DIAGNOSIS — N179 Acute kidney failure, unspecified: Secondary | ICD-10-CM

## 2019-02-16 LAB — GLUCOSE, CAPILLARY
Glucose-Capillary: 198 mg/dL — ABNORMAL HIGH (ref 70–99)
Glucose-Capillary: 147 mg/dL — ABNORMAL HIGH (ref 70–99)
Glucose-Capillary: 341 mg/dL — ABNORMAL HIGH (ref 70–99)
Glucose-Capillary: 375 mg/dL — ABNORMAL HIGH (ref 70–99)

## 2019-02-16 LAB — URINALYSIS, ROUTINE W REFLEX MICROSCOPIC
Bilirubin Urine: NEGATIVE
Glucose, UA: >=500 mg/dL — AB
Ketones, ur: NEGATIVE mg/dL
Leukocytes,Ua: NEGATIVE
Nitrite: NEGATIVE
Protein, ur: >=300 mg/dL — AB
Specific Gravity, Urine: 1.016 (ref 1.005–1.030)
pH: 5 (ref 5.0–8.0)

## 2019-02-16 LAB — RENAL FUNCTION PANEL
Albumin: 2.3 g/dL — ABNORMAL LOW (ref 3.5–5.0)
Anion gap: 10 (ref 5–15)
BUN: 46 mg/dL — ABNORMAL HIGH (ref 6–20)
CO2: 22 mmol/L (ref 22–32)
Calcium: 8.6 mg/dL — ABNORMAL LOW (ref 8.9–10.3)
Chloride: 102 mmol/L (ref 98–111)
Creatinine, Ser: 2.36 mg/dL — ABNORMAL HIGH (ref 0.61–1.24)
GFR calc Af Amer: 37 mL/min — ABNORMAL LOW (ref >60)
GFR calc non Af Amer: 32 mL/min — ABNORMAL LOW (ref >60)
Glucose, Bld: 110 mg/dL — ABNORMAL HIGH (ref 70–99)
Phosphorus: 4.4 mg/dL (ref 2.5–4.6)
Potassium: 3.5 mmol/L (ref 3.5–5.1)
Sodium: 134 mmol/L — ABNORMAL LOW (ref 135–145)

## 2019-02-16 LAB — ANA W/REFLEX IF POSITIVE: Anti Nuclear Antibody (ANA): NEGATIVE

## 2019-02-16 LAB — KAPPA/LAMBDA LIGHT CHAINS
Kappa free light chain: 64.8 mg/L — ABNORMAL HIGH (ref 3.3–19.4)
Kappa, lambda light chain ratio: 1.8 — ABNORMAL HIGH (ref 0.26–1.65)
Lambda free light chains: 36.1 mg/L — ABNORMAL HIGH (ref 5.7–26.3)

## 2019-02-16 LAB — C3 COMPLEMENT: C3 Complement: 134 mg/dL (ref 82–167)

## 2019-02-16 LAB — ANTI-DNA ANTIBODY, DOUBLE-STRANDED: ds DNA Ab: <1 IU/mL (ref 0–9)

## 2019-02-16 LAB — HEPATITIS C ANTIBODY: HCV Ab: 0.1 s/co ratio (ref 0.0–0.9)

## 2019-02-16 LAB — C4 COMPLEMENT: Complement C4, Body Fluid: 26 mg/dL (ref 14–44)

## 2019-02-16 LAB — HEPATITIS B SURFACE ANTIGEN: Hepatitis B Surface Ag: NEGATIVE

## 2019-02-16 MED ORDER — INSULIN ASPART 100 UNIT/ML ~~LOC~~ SOLN
15.0000 [IU] | Freq: Once | SUBCUTANEOUS | Status: AC
  Administered 2019-02-16: 23:00:00 15 [IU] via SUBCUTANEOUS

## 2019-02-16 MED ORDER — POTASSIUM CHLORIDE CRYS ER 20 MEQ PO TBCR
40.0000 meq | EXTENDED_RELEASE_TABLET | ORAL | Status: AC
  Administered 2019-02-16 (×2): 40 meq via ORAL
  Filled 2019-02-16: qty 2

## 2019-02-16 MED ORDER — ACETAMINOPHEN 325 MG PO TABS: 650.0000 mg | ORAL_TABLET | Freq: Four times a day (QID) | ORAL | Status: DC | PRN

## 2019-02-16 MED ORDER — POLYETHYLENE GLYCOL 3350 17 G PO PACK
17.0000 g | PACK | Freq: Every day | ORAL | Status: DC
  Administered 2019-02-17 – 2019-02-19 (×2): 17 g via ORAL
  Filled 2019-02-16 (×4): qty 1

## 2019-02-16 MED ORDER — FUROSEMIDE 10 MG/ML IJ SOLN
120.0000 mg | Freq: Once | INTRAVENOUS | Status: AC
  Administered 2019-02-16: 120 mg via INTRAVENOUS
  Filled 2019-02-16: qty 10

## 2019-02-16 MED ORDER — FUROSEMIDE 10 MG/ML IJ SOLN
40.0000 mg | Freq: Two times a day (BID) | INTRAMUSCULAR | Status: DC
  Administered 2019-02-17 – 2019-02-18 (×3): 40 mg via INTRAVENOUS
  Filled 2019-02-16 (×3): qty 4

## 2019-02-16 NOTE — Progress Notes (Signed)
[facility] NEPHROLOGY PROGRESS NOTE  Assessment/ Plan: Pt is a 48 y.o. yo male with history of HTN, DM with A1c 8.3-9, alcohol abuse with fatty liver, neuropathy, chronic pancreatitis admitted with chest pain, SOB and worsening lower extremity edema.  We are consulted for AKI and proteinuria.  #Acute kidney injury on CKD versus progressive CKD stage III: due to diabetic nephropathy/ACEI/HCTZ.  The creatinine level was around 1.5 in 06/2018 and now stable around 2.3.  Urinalysis has no RBC, no wbc but with nephrotic range proteinuria.  Ultrasound kidneys showed no obstruction however has echogenic kidney.  Agree with IV Lasix for lower extremity edema, changed to Lasix 40 IV twice a day starting from tomorrow.  Patient reported that he has increased urine output however not collecting for measurement.  Patient was educated and discussed with the nurse for a strict ins and out.   -checking secondary work-up of proteinuria as discussed below. -Monitor BMP, urine output.  Avoid nephrotoxins including IV contrast or NSAIDs.  #Nephrotic syndrome likely due to diabetic nephropathy: Patient has 10.5 g of proteinuria associated with chronic hematuria, hypoalbuminemia and lower extremity edema.  This is likely due to uncontrolled diabetic nephropathy. ANA, dsDNA, c3, c4, hep B, hep C, kappa lamda ratio unremarkable.F/u ANCA.  -Depending on the results he may benefit from kidney biopsy for definitive diagnosis.  I have discussed this with the patient.   -He has elevated LDL level, on  Statin.  #Hypokalemia likely due to diuretics:  Monitor lab.  #Acute respiratory failure with hypoxia: May have heart failure with preserved EF.  VQ scan negative for PE.  On bronchodilators, azithromycin and prednisone per primary team.  Not on oxygen currently.  #Chest pain: Resolved now.  #Hypertension: On metoprolol and diuretics.  Holding ACE inhibitor.    Subjective: Seen and examined at bedside.   Reports feeling good.  No chest pain, shortness of breath, nausea or vomiting.  Not recording urine per measurement.  Patient was educated. Objective Vital signs in last 24 hours: Vitals:   02/16/19 0026 02/16/19 0631 02/16/19 0915 02/16/19 1147  BP: (!) 142/92 (!) 155/103 (!) 159/109 (!) 161/111  Pulse: 84 88  85  Resp:    18  Temp: 98.3 F (36.8 C) 97.8 F (36.6 C)  98.1 F (36.7 C)  TempSrc:  Oral  Oral  SpO2: 98% 97%  97%  Weight:      Height:       Weight change: 0 kg  Intake/Output Summary (Last 24 hours) at 02/16/2019 1451 Last data filed at 02/16/2019 1400 Gross per 24 hour  Intake 600 ml  Output 2300 ml  Net -1700 ml       Labs: Basic Metabolic Panel: Recent Labs  Lab 02/14/19 0337 02/15/19 0502 02/16/19 0357  NA 136 135 134*  K 3.8 3.2* 3.5  CL 106 104 102  CO2 21* 21* 22  GLUCOSE 187* 117* 110*  BUN 40* 45* 46*  CREATININE 2.32* 2.31* 2.36*  CALCIUM 8.4* 8.5* 8.6*  PHOS  --  4.1 4.4   Liver Function Tests: Recent Labs  Lab 02/12/19 1154 02/15/19 0502 02/16/19 0357  AST 20  --   --   ALT 14  --   --   ALKPHOS 68  --   --   BILITOT 0.8  --   --   PROT 6.5  --   --   ALBUMIN 2.2* 2.1* 2.3*   Recent Labs  Lab 02/12/19 1154  LIPASE 34  No results for input(s): AMMONIA in the last 168 hours. CBC: Recent Labs  Lab 02/12/19 1154 02/12/19 2238 02/13/19 0309 02/15/19 0502  WBC 12.3* 16.8* 14.5* 13.7*  HGB 13.1 15.5 12.6* 12.6*  HCT 38.4* 47.3 37.2* 37.6*  MCV 87.7 91.0 88.8 88.1  PLT 253 257 246 266   Cardiac Enzymes: No results for input(s): CKTOTAL, CKMB, CKMBINDEX, TROPONINI in the last 168 hours. CBG: Recent Labs  Lab 02/15/19 1248 02/15/19 1552 02/15/19 2154 02/16/19 0808 02/16/19 1209  GLUCAP 156* 263* 438* 198* 147*    Iron Studies: No results for input(s): IRON, TIBC, TRANSFERRIN, FERRITIN in the last 72 hours. Studies/Results: No results found.  Medications: Infusions:   Scheduled Medications: . atorvastatin   40 mg Oral Daily  . clotrimazole   Topical BID  . enoxaparin (LOVENOX) injection  40 mg Subcutaneous Q24H  . [START ON 02/17/2019] furosemide  40 mg Intravenous Q12H  . gabapentin  800 mg Oral TID  . insulin aspart  0-15 Units Subcutaneous TID WC  . insulin glargine  15 Units Subcutaneous Daily  . metoprolol tartrate  50 mg Oral BID  . pantoprazole  80 mg Oral Daily  . potassium chloride  40 mEq Oral Q3H  . ramelteon  8 mg Oral QHS  . sodium chloride flush  3 mL Intravenous Once    have reviewed scheduled and prn medications.  Physical Exam: General:NAD, comfortable Heart:RRR, s1s2 nl, no rub Lungs:clear b/l, no crackle, no wheeze Abdomen:soft, Non-tender, non-distended Extremities: Lower extremity edema++ Neurology: Alert awake and following commands   [name] 02/16/2019,2:51 PM  LOS: 4 days  Pager: [phone]

## 2019-02-16 NOTE — Progress Notes (Addendum)
PT Cancellation Note  Patient Details Name: Christopher Davila MRN: [id number] DOB: 02-19-1971   Cancelled Treatment:    Reason Eval/Treat Not Completed: (P) PT screened, no needs identified, will sign off PT evaluation completed 8/30 and no PT services recommended. RN reports pt is independent in mobility in room and does not feel additional PT evaluation is need. PT will signing off.   Feliz Herard B. Migdalia Dk PT, DPT Acute Rehabilitation Services Pager [phone] Office [phone]   [place] 02/16/2019, 12:10 PM

## 2019-02-16 NOTE — Progress Notes (Signed)
Family Medicine Teaching Service Daily Progress Note Intern Pager: [phone]  Patient name: Christopher Davila: [id number] Date of birth: June 05, 1971 Age: 48 y.o. Gender: male  Primary Care Provider: Marliss Coots, NP Consultants: None Code Status: FULL  Pt Overview and Major Events to Date:  08/28: Admitted for dyspnea 08/29: cardiology curb-sided and do not believe this is ACS  Assessment and Plan: Christopher Davila is a 48 y.o. male presenting with dyspnea. PMH is significant for T2 DM, HTN, reports fatty liver, HLD, EtOH use.  Dyspnea with new O2 requirement Patient satting 99% on room air. Pitting edema 2+ to hips.  Weight increased by 1.15 lbs to 155.65 yesterday, no weight yet today. Chest x-ray with bilateral pleural effusions. VQ scan with no signs of PE.  Patient started on prednisone 40 mg and azithromycin on 8/28 by PCP. Continued here.  Patient with no history of asthma or COPD and no oxygen requirement at baseline.  However does have extensive smoking history.  He is not complaining of any dyspnea today, reports he feels well.  -Strict I/Os -Daily weight -Continuous O2 monitoring -Continue albuterol and ipratropium nebulizers every 4 hours PRN -Continue azithromycin,(8/28-9/1) to complete 5 day course.  -Continue prednisone 40 mg daily, (8/28-9/1) to complete 5 day course -PT/OT to eval and treat  AKI with nephrotic syndrome Patient's creatinine on admission was 2.33, now 2.36. Patient's baseline runs approximately 1.5. UA with >300 protein, glucose and blood with 6-10 RBCs seen on micro. In ED he received 40 and 80 mg IV lasix, after receiving 2 L in ED. Currently on 120 mg IV Lasix qd. Pt has 10.5 g or proteinuria associated w/ chronic hematuria, hypoalbuminemia, and LE edema. Etiology is likely 2/2 uncontrolled diabetic nephropathy, but appreciate recommendations from nephrology for ruling out other secondary causes including ANA, ANCA, complements, free light  chain.  Labs have been ordered, awaiting results. He also has hypokalemia, likely due to diuretics, which was improved today to 3.5 from 3.2 with supplements. -120 mg IV Lasix given.  Output not documented due to collection for 24-hour protein, will make strict I/Os today -STRICT I/Os -Daily weight -Hold patient's lisinopril and HCTZ in setting of AKI -Renal US no obstruction, but echogenic and FEUrea 45.9% indicating intrinsic etiology -24-hour protein urine 10,560 -Nephrology consulted, appreciate recommendations - HepBsAg negative - HCV ab negative -Trend BMP / urinary output -K-DUR 40 mEq given 2x yesterday, will repeat 1x today -Replace electrolytes as indicated -Avoid nephrotoxic agents (IV contrast, NSAIDs, etc) ensure adequate renal perfusion  Elevated troponins   Lower extremity edema BNP elevated to 567.4.  Echo with EF of 55 to 60% with pseudonormalization noted.  EKG with tachycardia and inferior T wave abnormality that was previously seen. Troponin 40>90>107> 83>83.  Spoke with cardiologist, Dr. Rayann Heman over the phone on 8/30 and reports that given he has just had a VQ scan he would not be eligible for stress test at this time but he believes that this is most likely related to kidney injury.  States that he may need follow-up outpatient for a stress test but he does not believe that he needs to be followed here in the hospital.  Reports that if he does not improve or does worsen significantly than we may consult them at that time. -Daily weights, strict I's and O's -Elevate legs  Abdominal pain, improved Patient reports that his abdominal pain has improved along with his shortness of breath. KUB was normal. Abdominal xray was normal. Possible GERD. -  Tylenol 616m q6h - Protonix 80 mg daily - Monitor with serial abdominal exams - Consider CT scan with worsening.  Diabetes type 2 with neuropathy Hemoglobin A1c is 8.3 home medication includes metformin 500 mg twice daily.  CBGs have recently ranged from 187-429.  Likely worsening in setting of steroid burst - Monitor CBGs - increase Lantus 10 units - mSSI -lotrimin cream for toe rash on feet  HTN Pressures over the last 24 hours (on 9/1) have ranged from 142/92-155/103. Home medications include Norvasc 10 mg, Zestoretic 20-12.5 daily, metoprolol 50 mg twice daily.  On admission patient received 2 L boluses of fluid which is could be contributing to patient's hypertension. Since fluid status is still high today, we will continue with diuresis and watch for improvement. - Monitor blood pressures, likely to improve with diuresis - Continue amlodipine, metoprolol  - Hold HCTZ/lisinopril in setting of AKI  Reported fatty liver Admission labs showed albumin AST-20, ALT-14, lipase-34, alk phos- 68.  Albumin 2.2.   HLD Home medications include Lipitor 40 mg daily. Total chol 244. Triglycerides 182. LDL 136. - Continue home Lipitor dose  Alcohol abuse Patient drinks anywhere from 2 beers to 12 pack on a given day.  Also has a history of alcoholic pancreatitis, and alcoholic gastritis and has been seen in ED for this multiple times.  Reports never being admitted to the hospital for alcohol withdrawal.  Does not display any symptoms of active withdrawal at this time. -Monitor CIWA scores without Ativan -CIWA scores, 0 at 0600  FEN/GI: Carb modified, 2 g sodium diet Prophylaxis: Lovenox 40 mg daily   Disposition: Pending improved respiratory status and nephrotic syndrome work up  Subjective:  Patient doing well this morning.  Pitting edema 2+ to hips. He does not think he has more volume in his abdomen than is usual for him.   Otherwise doing well.  Denies any shortness of breath or chest pain.  Objective: Temp:  [97.5 F (36.4 C)-98.3 F (36.8 C)] 97.8 F (36.6 C) (09/01 0631) Pulse Rate:  [84-92] 88 (09/01 0631) BP: (142-155)/(92-105) 155/103 (09/01 0631) SpO2:  [97 %-98 %] 97 % (09/01 0631) Weight:   [70.6 kg] 70.6 kg (08/31 1000)   Physical Exam: General: NAD, resting comfortably in bed. Cardiovascular: RRR, no m/r/g, +2 pitting edema in lower extremities bilaterally to hips Respiratory: Normal work of breathing, clear to auscultation bilaterally Gastrointestinal: soft, nontender, nondistended MSK: moves 4 extremities equally Derm: no rashes appreciated Neuro: CN II-XII grossly intact  Psych: AOx3, appropriate affect  Laboratory: Recent Labs  Lab 02/12/19 2238 02/13/19 0309 02/15/19 0502  WBC 16.8* 14.5* 13.7*  HGB 15.5 12.6* 12.6*  HCT 47.3 37.2* 37.6*  PLT 257 246 266   Recent Labs  Lab 02/12/19 1154  02/14/19 0337 02/15/19 0502 02/16/19 0357  NA 133*   < > 136 135 134*  K 3.2*   < > 3.8 3.2* 3.5  CL 100   < > 106 104 102  CO2 20*   < > 21* 21* 22  BUN 30*   < > 40* 45* 46*  CREATININE 2.33*   < > 2.32* 2.31* 2.36*  CALCIUM 8.4*   < > 8.4* 8.5* 8.6*  PROT 6.5  --   --   --   --   BILITOT 0.8  --   --   --   --   ALKPHOS 68  --   --   --   --   ALT 14  --   --   --   --  AST 20  --   --   --   --   GLUCOSE 446*   < > 187* 117* 110*   < > = values in this interval not displayed.   Lipid panel [[id number]] (Abnormal) Collected: 02/12/19 2238  Specimen: Blood from Hand Updated: 02/12/19 2338   Cholesterol 244High  mg/dL    Triglycerides 182High  mg/dL    HDL 72 mg/dL    Total CHOL/HDL Ratio 3.4 RATIO    VLDL 36 mg/dL    LDL Cholesterol 136High  mg/dL    Hemoglobin A1c [[id number]] (Abnormal) Collected: 02/12/19 2238  Specimen: Blood from Hand Updated: 02/12/19 2314   Hgb A1c MFr Bld 8.3High  %      Imaging/Diagnostic Tests: CHEST - 2 VIEW COMPARISON:  PA and lateral chest 06/18/2018. Lungs are clear. There are very small bilateral pleural effusions. Heart size is normal. No pneumothorax. No acute or focal bony abnormality. IMPRESSION: Small bilateral pleural effusions.  Otherwise negative.   PORTABLE CHEST 1 VIEW COMPARISON:  Chest radiograph  dated 02/12/2019 and CT dated 12/21/2007. IMPRESSION: 1. No focal consolidation. 2. No evidence of bowel obstruction.  PORTABLE CHEST 1 VIEW COMPARISON:  Chest radiograph dated 02/12/2019 and CT dated 12/21/2007. IMPRESSION: 1. No focal consolidation. 2. No evidence of bowel obstruction.  NUCLEAR MEDICINE PERFUSION LUNG SCAN COMPARISON:  Chest radiograph 02/12/2019 IMPRESSION: No evidence of pulmonary embolism.   Gladys Damme, MD 02/16/2019, 9:28 AM PGY-1, [place] Intern pager: [phone], text pages welcome

## 2019-02-17 LAB — RENAL FUNCTION PANEL
Albumin: 2.2 g/dL — ABNORMAL LOW (ref 3.5–5.0)
Anion gap: 11 (ref 5–15)
BUN: 42 mg/dL — ABNORMAL HIGH (ref 6–20)
CO2: 21 mmol/L — ABNORMAL LOW (ref 22–32)
Calcium: 8.6 mg/dL — ABNORMAL LOW (ref 8.9–10.3)
Chloride: 103 mmol/L (ref 98–111)
Creatinine, Ser: 2.19 mg/dL — ABNORMAL HIGH (ref 0.61–1.24)
GFR calc Af Amer: 40 mL/min — ABNORMAL LOW (ref >60)
GFR calc non Af Amer: 35 mL/min — ABNORMAL LOW (ref >60)
Glucose, Bld: 144 mg/dL — ABNORMAL HIGH (ref 70–99)
Phosphorus: 3.9 mg/dL (ref 2.5–4.6)
Potassium: 3.9 mmol/L (ref 3.5–5.1)
Sodium: 135 mmol/L (ref 135–145)

## 2019-02-17 LAB — GLUCOSE, CAPILLARY
Glucose-Capillary: 157 mg/dL — ABNORMAL HIGH (ref 70–99)
Glucose-Capillary: 105 mg/dL — ABNORMAL HIGH (ref 70–99)
Glucose-Capillary: 217 mg/dL — ABNORMAL HIGH (ref 70–99)
Glucose-Capillary: 267 mg/dL — ABNORMAL HIGH (ref 70–99)

## 2019-02-17 LAB — ANCA TITERS
Atypical P-ANCA titer: <1:20 {titer}
C-ANCA: <1:20 {titer}
P-ANCA: <1:20 {titer}

## 2019-02-17 MED ORDER — AMLODIPINE BESYLATE 10 MG PO TABS
10.0000 mg | ORAL_TABLET | Freq: Every day | ORAL | Status: DC
  Administered 2019-02-17 – 2019-02-20 (×4): 10 mg via ORAL
  Filled 2019-02-17 (×4): qty 1

## 2019-02-17 MED ORDER — GABAPENTIN 300 MG PO CAPS
300.0000 mg | ORAL_CAPSULE | Freq: Three times a day (TID) | ORAL | Status: DC
  Administered 2019-02-17 – 2019-02-20 (×8): 300 mg via ORAL
  Filled 2019-02-17 (×8): qty 1

## 2019-02-17 MED ORDER — INSULIN ASPART 100 UNIT/ML ~~LOC~~ SOLN
0.0000 [IU] | Freq: Three times a day (TID) | SUBCUTANEOUS | Status: DC
  Administered 2019-02-17: 17:00:00 3 [IU] via SUBCUTANEOUS
  Administered 2019-02-18: 5 [IU] via SUBCUTANEOUS
  Administered 2019-02-19: 3 [IU] via SUBCUTANEOUS
  Administered 2019-02-19: 16:00:00 2 [IU] via SUBCUTANEOUS
  Administered 2019-02-20: 12:00:00 7 [IU] via SUBCUTANEOUS

## 2019-02-17 NOTE — Progress Notes (Addendum)
[facility] NEPHROLOGY PROGRESS NOTE  Assessment/ Plan: Pt is a 48 y.o. yo male with history of HTN, DM with A1c 8.3-9, alcohol abuse with fatty liver, neuropathy, chronic pancreatitis admitted with chest pain, SOB and worsening lower extremity edema.  We are consulted for AKI and proteinuria.  #Acute kidney injury on CKD versus progressive CKD stage III: likely due to diabetic nephropathy/ACEI/HCTZ.  The creatinine level was around 1.5 in 06/2018 and 2.32 on admission. Urinalysis has no RBC, no wbc but with nephrotic range proteinuria.  Ultrasound kidneys showed no obstruction however has echogenic kidney.  -Serum creatinine level trending down to 2.19, urine output increased with IV Lasix.  Still has lower extremity edema therefore plan to continue IV Lasix 40 mg twice a day.  -checking secondary work-up of proteinuria as discussed below. -Monitor BMP, urine output.  Avoid nephrotoxins including IV contrast or NSAIDs.  #Nephrotic syndrome likely due to diabetic nephropathy: Patient has 10.5 g of proteinuria associated with chronic hematuria, hypoalbuminemia and lower extremity edema.  This is likely due to uncontrolled diabetic nephropathy. ANA, dsDNA, c3, c4, hep B, hep C, kappa lamda ratio unremarkable.F/u ANCA.  -I have discussed with the patient at length today about kidney biopsy.  The benefit and risk of kidney biopsy including bleeding, infection, losing kidney function etc. discussed with the patient.  IR consulted for biopsy, hold Lovenox and keep n.p.o. past midnight.  -He has elevated LDL level, on  Statin.  #Hypokalemia likely due to diuretics:  Monitor lab.  #Acute respiratory failure with hypoxia: May have heart failure with preserved EF.  VQ scan negative for PE.  On bronchodilators, azithromycin and prednisone per primary team.  Not on oxygen currently.  #Chest pain: Resolved now.  #Hypertension: On metoprolol and diuretics.  Holding ACE inhibitor.   Added  amlodipine.   Subjective: Seen and examined at bedside.  Increase urine output with diuretics.  Still has lower extremity edema.  He denies nausea, vomiting, chest pain, shortness of breath.  Discussed about the kidney biopsy and agreed. Objective Vital signs in last 24 hours: Vitals:   02/16/19 0915 02/16/19 1147 02/16/19 2114 02/17/19 0529  BP: (!) 159/109 (!) 161/111 (!) 162/106   Pulse:  85 92 88  Resp:  18 20   Temp:  98.1 F (36.7 C) 98.2 F (36.8 C) 98 F (36.7 C)  TempSrc:  Oral Oral Oral  SpO2:  97% 99% 100%  Weight:    69.6 kg  Height:       Weight change: -0.973 kg  Intake/Output Summary (Last 24 hours) at 02/17/2019 1336 Last data filed at 02/17/2019 0800 Gross per 24 hour  Intake 1080 ml  Output 3775 ml  Net -2695 ml       Labs: Basic Metabolic Panel: Recent Labs  Lab 02/15/19 0502 02/16/19 0357 02/17/19 0349  NA 135 134* 135  K 3.2* 3.5 3.9  CL 104 102 103  CO2 21* 22 21*  GLUCOSE 117* 110* 144*  BUN 45* 46* 42*  CREATININE 2.31* 2.36* 2.19*  CALCIUM 8.5* 8.6* 8.6*  PHOS 4.1 4.4 3.9   Liver Function Tests: Recent Labs  Lab 02/12/19 1154 02/15/19 0502 02/16/19 0357 02/17/19 0349  AST 20  --   --   --   ALT 14  --   --   --   ALKPHOS 68  --   --   --   BILITOT 0.8  --   --   --   PROT 6.5  --   --   --  ALBUMIN 2.2* 2.1* 2.3* 2.2*   Recent Labs  Lab 02/12/19 1154  LIPASE 34   No results for input(s): AMMONIA in the last 168 hours. CBC: Recent Labs  Lab 02/12/19 1154 02/12/19 2238 02/13/19 0309 02/15/19 0502  WBC 12.3* 16.8* 14.5* 13.7*  HGB 13.1 15.5 12.6* 12.6*  HCT 38.4* 47.3 37.2* 37.6*  MCV 87.7 91.0 88.8 88.1  PLT 253 257 246 266   Cardiac Enzymes: No results for input(s): CKTOTAL, CKMB, CKMBINDEX, TROPONINI in the last 168 hours. CBG: Recent Labs  Lab 02/16/19 1209 02/16/19 1558 02/16/19 2113 02/17/19 0740 02/17/19 1226  GLUCAP 147* 341* 375* 157* 105*    Iron Studies: No results for input(s): IRON, TIBC,  TRANSFERRIN, FERRITIN in the last 72 hours. Studies/Results: No results found.  Medications: Infusions:   Scheduled Medications: . amLODipine  10 mg Oral Daily  . atorvastatin  40 mg Oral Daily  . clotrimazole   Topical BID  . furosemide  40 mg Intravenous Q12H  . gabapentin  300 mg Oral TID  . insulin aspart  0-15 Units Subcutaneous TID WC  . insulin glargine  15 Units Subcutaneous Daily  . metoprolol tartrate  50 mg Oral BID  . pantoprazole  80 mg Oral Daily  . polyethylene glycol  17 g Oral Daily  . ramelteon  8 mg Oral QHS  . sodium chloride flush  3 mL Intravenous Once    have reviewed scheduled and prn medications.  Physical Exam: General:NAD, comfortable Heart:RRR, s1s2 nl, no rub Lungs: Clear b/l, no crackle, no wheeze Abdomen:soft, Non-tender, non-distended Extremities: Lower extremity edema++ Neurology: Alert awake and following commands  Ellina Sivertsen [name] 02/17/2019,1:36 PM  LOS: 5 days  Pager: ID:[phone]

## 2019-02-17 NOTE — Progress Notes (Signed)
Family Medicine Teaching Service Daily Progress Note Intern Pager: [phone]  Patient name: Christopher Davila record number: [id number] Date of birth: Feb 18, 1971 Age: 48 y.o. Gender: male  Primary Care Provider: Marliss Coots, NP Consultants: None Code Status: FULL  Pt Overview and Major Events to Date:  08/28: Admitted for dyspnea 08/29: cardiology curb-sided and do not believe this is ACS  Assessment and Plan: Christopher [facility]ila is a 48 y.o. male presenting with dyspnea. PMH is significant for T2 DM, HTN, reports fatty liver, HLD, EtOH use.  Dyspnea with new O2 requirement Patient satting 99% on room air. Pitting edema 2+ to R hip, pitting edema 2+ to left mid thigh.  Patient lost 2 pounds yesterday, weight now 153.5.  Chest x-ray with bilateral pleural effusions. VQ scan with no signs of PE.  Patient started on prednisone 40 mg and azithromycin on 8/28 by PCP. Continued here and ended on September 1.  Patient with no history of asthma or COPD and no oxygen requirement at baseline.  However does have extensive smoking history.  He is not complaining of any dyspnea today, reports he feels well.  -Strict I/Os -Daily weight -Continuous O2 monitoring -Continue albuterol and ipratropium nebulizers every 4 hours PRN - S/p azithromycin,(8/28-9/1) -S/p prednisone 40 mg daily, (8/28-9/1) -PT/OT signed off  AKI with nephrotic syndrome Patient's creatinine on admission was 2.33, now 2.19. Patient's baseline runs approximately 1.5. UA with >300 protein, glucose and blood with 6-10 RBCs seen on micro. In ED he received 40 and 80 mg IV lasix, after receiving 2 L in ED. Yesterday he received 120 mg IV Lasix in the morning, nephrology requested him to be on 40 mg Lasix twice daily.  This was started.  Pt has 10.5 g or proteinuria associated w/ chronic hematuria, hypoalbuminemia, and LE edema. Etiology is likely 2/2 uncontrolled diabetic nephropathy; other secondary causes including ANA, ANCA,  complements, free light chain ruled out at this time.  Awaiting ANCA results.  He also has hypokalemia, likely due to diuretics, which was improved today to 3.9 from 3.2 with supplements. -120 mg IV Lasix given yesterday, with UOP of  4,675 mL (2.8 mL/kg/hr) - Currently on 40 mg Lasix IV twice daily per Nephrology  -STRICT I/Os -Daily weight -Hold patient's lisinopril and HCTZ in setting of AKI -Renal US no obstruction, but echogenic and FEUrea 45.9% indicating intrinsic etiology -24-hour protein urine 10,560 -Nephrology consulted, appreciate recommendations -Trend BMP / urinary output -Replace electrolytes as indicated -Avoid nephrotoxic agents (IV contrast, NSAIDs, etc) ensure adequate renal perfusion  Elevated troponins   Lower extremity edema BNP elevated to 567.4.  Echo with EF of 55 to 60% with pseudonormalization noted.  EKG with tachycardia and inferior T wave abnormality that was previously seen. Troponin 40>90>107> 83>83.  Spoke with cardiologist, Dr. Rayann Heman over the phone on 8/30 and reports that given he has just had a VQ scan he would not be eligible for stress test at this time but he believes that this is most likely related to kidney injury.  States that he may need follow-up outpatient for a stress test but he does not believe that he needs to be followed here in the hospital.  Reports that if he does not improve or does worsen significantly than we may consult them at that time. -Daily weights, strict I's and O's -Elevate legs  Abdominal pain, improved Patient reports that his abdominal pain has improved along with his shortness of breath. KUB was normal. Abdominal xray was normal. Possible  GERD. -Tylenol 62m q6h - Protonix 80 mg daily - Monitor with serial abdominal exams - Consider CT scan with worsening.  Diabetes type 2 with neuropathy Hemoglobin A1c is 8.3 home medication includes metformin 500 mg twice daily. CBGs have recently ranged from 144-357.  Likely  worsening in setting of steroid burst, will watch closely today as steroids completed yesterday. - Monitor CBGs - increase Lantus 10 units - mSSI -lotrimin cream for toe rash on feet  HTN Pressures over the last 24 hours (on 9/1) have ranged from 140s over 90s to 160s over 110s. Home medications include Norvasc 10 mg, Zestoretic 20-12.5 daily, metoprolol 50 mg twice daily.  On admission patient received 2 L boluses of fluid which is could be contributing to patient's hypertension. Since fluid status is still high today, we will continue with diuresis and watch for improvement.  Home amlodipine 10 mg daily restarted. -Start amlodipine 10 mg daily - Monitor blood pressures, likely to improve with diuresis - Hold HCTZ/lisinopril in setting of AKI  Reported fatty liver Admission labs showed albumin AST-20, ALT-14, lipase-34, alk phos- 68.  Albumin 2.2.   HLD Home medications include Lipitor 40 mg daily. Total chol 244. Triglycerides 182. LDL 136. - Continue home Lipitor dose  Alcohol abuse Patient drinks anywhere from 2 beers to 12 pack on a given day.  Also has a history of alcoholic pancreatitis, and alcoholic gastritis and has been seen in ED for this multiple times.  Reports never being admitted to the hospital for alcohol withdrawal.  Does not display any symptoms of active withdrawal at this time. -Monitor CIWA scores without Ativan -CIWA scores 0 9/1  FEN/GI: Carb modified, 2 g sodium diet Prophylaxis: Lovenox 40 mg daily   Disposition: Pending improved respiratory status and nephrotic syndrome work up  Subjective:  Patient doing well this morning.  Pitting edema 2+ to right hip, and left mid thigh. He does not think he has more volume in his abdomen than is usual for him.   Otherwise doing well.  Denies any shortness of breath or chest pain.  Objective: Temp:  [98 F (36.7 C)-98.2 F (36.8 C)] 98 F (36.7 C) (09/02 0529) Pulse Rate:  [85-92] 88 (09/02 0529) Resp:   [18-20] 20 (09/01 2114) BP: (161-162)/(106-111) 162/106 (09/01 2114) SpO2:  [97 %-100 %] 100 % (09/02 0529) Weight:  [69.6 kg] 69.6 kg (09/02 0529)   Physical Exam: General: NAD, resting comfortably in bed. Cardiovascular: RRR, no m/r/g, +2 pitting edema in right lower extremity to hip, and to mid thigh on left lower extremity Respiratory: Normal work of breathing, clear to auscultation bilaterally Gastrointestinal: soft, nontender, nondistended MSK: moves 4 extremities equally Derm: no rashes appreciated Neuro: CN II-XII grossly intact  Psych: AOx3, appropriate affect  Laboratory: Recent Labs  Lab 02/12/19 2238 02/13/19 0309 02/15/19 0502  WBC 16.8* 14.5* 13.7*  HGB 15.5 12.6* 12.6*  HCT 47.3 37.2* 37.6*  PLT 257 246 266   Recent Labs  Lab 02/12/19 1154  02/15/19 0502 02/16/19 0357 02/17/19 0349  NA 133*   < > 135 134* 135  K 3.2*   < > 3.2* 3.5 3.9  CL 100   < > 104 102 103  CO2 20*   < > 21* 22 21*  BUN 30*   < > 45* 46* 42*  CREATININE 2.33*   < > 2.31* 2.36* 2.19*  CALCIUM 8.4*   < > 8.5* 8.6* 8.6*  PROT 6.5  --   --   --   --  BILITOT 0.8  --   --   --   --   ALKPHOS 68  --   --   --   --   ALT 14  --   --   --   --   AST 20  --   --   --   --   GLUCOSE 446*   < > 117* 110* 144*   < > = values in this interval not displayed.   Lipid panel [[id number]] (Abnormal) Collected: 02/12/19 2238  Specimen: Blood from Hand Updated: 02/12/19 2338   Cholesterol 244High  mg/dL    Triglycerides 182High  mg/dL    HDL 72 mg/dL    Total CHOL/HDL Ratio 3.4 RATIO    VLDL 36 mg/dL    LDL Cholesterol 136High  mg/dL    Hemoglobin A1c [[id number]] (Abnormal) Collected: 02/12/19 2238  Specimen: Blood from Hand Updated: 02/12/19 2314   Hgb A1c MFr Bld 8.3High  %      Imaging/Diagnostic Tests: CHEST - 2 VIEW COMPARISON:  PA and lateral chest 06/18/2018. Lungs are clear. There are very small bilateral pleural effusions. Heart size is normal. No pneumothorax. No acute or  focal bony abnormality. IMPRESSION: Small bilateral pleural effusions.  Otherwise negative.   PORTABLE CHEST 1 VIEW COMPARISON:  Chest radiograph dated 02/12/2019 and CT dated 12/21/2007. IMPRESSION: 1. No focal consolidation. 2. No evidence of bowel obstruction.  PORTABLE CHEST 1 VIEW COMPARISON:  Chest radiograph dated 02/12/2019 and CT dated 12/21/2007. IMPRESSION: 1. No focal consolidation. 2. No evidence of bowel obstruction.  NUCLEAR MEDICINE PERFUSION LUNG SCAN COMPARISON:  Chest radiograph 02/12/2019 IMPRESSION: No evidence of pulmonary embolism.   Gladys Damme, MD 02/17/2019, 9:27 AM PGY-1, [facility] Intern pager: [phone], text pages welcome

## 2019-02-17 NOTE — Consult Note (Signed)
Chief Complaint: Patient was seen in consultation today for acute kidney injury  Referring Physician(s): Dr. Carolin Sicks  Supervising Physician: Markus Daft  Patient Status: [facility] - In-pt  History of Present Illness: Christopher [facility]ila is a 48 y.o. male with past medical history of HTN, DM, alcohol use, chronic pancreatitis, fatty liver admitted with increased lower extremity edema.  Patient found to have acute renal injury vs. Progressive renal disease.  SCr 2.19. IR consulted for kidney biopsy at the request of Dr. Carolin Sicks.   Patient assessed at bedside.  Resting comfortably. No complaints during my visit today. Agreeable to biopsy.    Past Medical History:  Diagnosis Date  . Diabetes mellitus   . Gastroparesis due to DM ([place])   . Hypertension   . Neuropathy of lower extremity   . Pancreatitis 06/2012    History reviewed. No pertinent surgical history.  Allergies: Morphine and related  Medications: Prior to Admission medications   Medication Sig Start Date End Date Taking? Authorizing Provider  amLODipine (NORVASC) 10 MG tablet Take 10 mg by mouth daily.   Yes [provider]  aspirin EC 81 MG tablet Take 1 tablet (81 mg total) by mouth daily. 05/24/15  Yes Ghimire, Henreitta Leber, MD  atorvastatin (LIPITOR) 40 MG tablet Take 1 tablet (40 mg total) by mouth daily. 02/15/14  Yes Rumley, [place] N, DO  azithromycin (ZITHROMAX) 250 MG tablet Take 250-500 mg by mouth daily. Take 2 tablets on day 1 then 1 tablet daily for days  2-5   Yes [provider]  gabapentin (NEURONTIN) 800 MG tablet Take 800 mg by mouth 3 (three) times daily.   Yes [provider]  lisinopril-hydrochlorothiazide (PRINZIDE,ZESTORETIC) 20-12.5 MG per tablet Take 1 tablet by mouth daily.   Yes [provider]  metFORMIN (GLUCOPHAGE) 500 MG tablet Take 1 tablet (500 mg total) by mouth 2 (two) times daily with a meal. 07/27/14  Yes Leone Brand, MD  metoprolol (LOPRESSOR) 50 MG tablet  Take 50 mg by mouth 2 (two) times daily.   Yes [provider]  pantoprazole (PROTONIX) 20 MG tablet Take 1 tablet (20 mg total) by mouth daily. 02/12/19   Carlisle Cater, PA-C  sucralfate (CARAFATE) 1 g tablet Take 1 tablet (1 g total) by mouth 4 (four) times daily -  with meals and at bedtime. 02/12/19   Carlisle Cater, PA-C  traMADol (ULTRAM) 50 MG tablet Take 1 tablet (50 mg total) by mouth every 6 (six) hours as needed. Patient not taking: Reported on 12/22/2017 11/28/15   Virgel Manifold, MD     Family History  Problem Relation Age of Onset  . Diabetes Mother   . Lung disease Mother   . Diabetes Maternal Aunt   . CAD Maternal Aunt   . CAD Cousin     Social History   Socioeconomic History  . Marital status: Single    Spouse name: Not on file  . Number of children: Not on file  . Years of education: Not on file  . Highest education level: Not on file  Occupational History  . Not on file  Social Needs  . Financial resource strain: Not on file  . Food insecurity    Worry: Not on file    Inability: Not on file  . Transportation needs    Medical: Not on file    Non-medical: Not on file  Tobacco Use  . Smoking status: Current Every Day Smoker    Packs/day: 0.00  Types: Cigarettes  . Smokeless tobacco: Never Used  Substance and Sexual Activity  . Alcohol use: Yes    Comment: on the weekends per patient  . Drug use: No    Comment: per pt* smoking only two cigarettes per day, trying to quit   . Sexual activity: Not on file  Lifestyle  . Physical activity    Days per week: Not on file    Minutes per session: Not on file  . Stress: Not on file  Relationships  . Social Herbalist on phone: Not on file    Gets together: Not on file    Attends religious service: Not on file    Active member of club or organization: Not on file    Attends meetings of clubs or organizations: Not on file    Relationship status: Not on file  Other Topics Concern  . Not on  file  Social History Narrative  . Not on file     Review of Systems: A 12 point ROS discussed and pertinent positives are indicated in the HPI above.  All other systems are negative.  Review of Systems  Constitutional: Negative for fatigue and fever.  Respiratory: Negative for cough and shortness of breath.   Cardiovascular: Negative for chest pain.  Gastrointestinal: Negative for abdominal pain.  Musculoskeletal: Negative for back pain.  Psychiatric/Behavioral: Negative for behavioral problems and confusion.    Vital Signs: BP (!) 148/102   Pulse 92   Temp 98.2 F (36.8 C) (Oral)   Resp 20   Ht 5\' 3"  (1.6 m)   Wt 153 lb 8 oz (69.6 kg)   SpO2 100%   BMI 27.19 kg/m   Physical Exam Vitals signs and nursing note reviewed.  Constitutional:      Appearance: He is well-developed.  Cardiovascular:     Rate and Rhythm: Normal rate and regular rhythm.  Pulmonary:     Effort: Pulmonary effort is normal. No respiratory distress.     Breath sounds: Normal breath sounds.  Abdominal:     Tenderness: There is no abdominal tenderness.  Skin:    General: Skin is warm and dry.  Neurological:     General: No focal deficit present.     Mental Status: He is alert and oriented to person, place, and time.  Psychiatric:        Mood and Affect: Mood normal.        Behavior: Behavior normal.      MD Evaluation Airway: WNL Heart: WNL Abdomen: WNL Chest/ Lungs: WNL ASA  Classification: 3 Mallampati/Airway Score: Two   Imaging: Dg Chest 2 View  Result Date: 02/12/2019 CLINICAL DATA:  Diffuse abdominal and epigastric pain for 2 days. EXAM: CHEST - 2 VIEW COMPARISON:  PA and lateral chest 06/18/2018. FINDINGS: Lungs are clear. There are very small bilateral pleural effusions. Heart size is normal. No pneumothorax. No acute or focal bony abnormality. IMPRESSION: Small bilateral pleural effusions.  Otherwise negative. Electronically Signed   By: Inge Rise M.D.   On: 02/12/2019  16:41   Abd 1 View (kub)  Result Date: 02/12/2019 CLINICAL DATA:  48 year old male with generalized abdominal pain. EXAM: PORTABLE CHEST 1 VIEW COMPARISON:  Chest radiograph dated 02/12/2019 and CT dated 12/21/2007. FINDINGS: Minimal bibasilar haziness may represent atelectatic changes or trace pleural effusion. There is no focal consolidation or pneumothorax. The cardiac silhouette is within normal limits. No acute osseous pathology. There is no bowel dilatation or evidence of obstruction. Pharmacist, community  is noted throughout the colon. No free air or radiopaque calculi identified. The osseous structures and soft tissues are unremarkable. IMPRESSION: 1. No focal consolidation. 2. No evidence of bowel obstruction. Electronically Signed   By: Anner Crete M.D.   On: 02/12/2019 23:54   Nm Pulmonary Perfusion  Result Date: 02/13/2019 CLINICAL DATA:  Shortness of breath.  Leg swelling. EXAM: NUCLEAR MEDICINE PERFUSION LUNG SCAN TECHNIQUE: Perfusion images were obtained in multiple projections after intravenous injection of radiopharmaceutical. Ventilation scans intentionally deferred if perfusion scan and chest x-ray adequate for interpretation during COVID 19 epidemic. RADIOPHARMACEUTICALS:  1.6 mCi Tc-91m MAA IV COMPARISON:  Chest radiograph 02/12/2019 FINDINGS: Perfusion images demonstrate no wedge-shaped defects to suggest pulmonary embolism. IMPRESSION: No evidence of pulmonary embolism. Electronically Signed   By: Abigail Miyamoto M.D.   On: 02/13/2019 09:55   US Renal  Result Date: 02/14/2019 CLINICAL DATA:  Inpatient.  Acute kidney injury. EXAM: RENAL / URINARY TRACT ULTRASOUND COMPLETE COMPARISON:  10/07/2013 CT abdomen/pelvis. FINDINGS: Right Kidney: Renal measurements: 10.7 x 6.2 x 6.1 cm = volume: 214 mL. Normal size echogenic right kidney. No mass. No hydronephrosis. Incidental right pleural effusion. Left Kidney: Renal measurements: 11.4 x 7.9 x 6.5 cm = volume: 305 mL. Normal size echogenic left kidney. No  mass. No hydronephrosis. Incidental left pleural effusion. Bladder: Appears normal for degree of bladder distention. Ureteral jets demonstrated bilaterally. IMPRESSION: 1. No hydronephrosis. 2. Echogenic normal size kidneys, compatible with nonspecific acute renal parenchymal disease. 3. Normal bladder. 4. Incidental bilateral pleural effusions, left greater than right. Electronically Signed   By: Ilona Sorrel M.D.   On: 02/14/2019 14:30   Portable Chest 1 View  Result Date: 02/12/2019 CLINICAL DATA:  48 year old male with generalized abdominal pain. EXAM: PORTABLE CHEST 1 VIEW COMPARISON:  Chest radiograph dated 02/12/2019 and CT dated 12/21/2007. FINDINGS: Minimal bibasilar haziness may represent atelectatic changes or trace pleural effusion. There is no focal consolidation or pneumothorax. The cardiac silhouette is within normal limits. No acute osseous pathology. There is no bowel dilatation or evidence of obstruction. Air is noted throughout the colon. No free air or radiopaque calculi identified. The osseous structures and soft tissues are unremarkable. IMPRESSION: 1. No focal consolidation. 2. No evidence of bowel obstruction. Electronically Signed   By: Anner Crete M.D.   On: 02/12/2019 23:54    Labs:  CBC: Recent Labs    02/12/19 1154 02/12/19 2238 02/13/19 0309 02/15/19 0502  WBC 12.3* 16.8* 14.5* 13.7*  HGB 13.1 15.5 12.6* 12.6*  HCT 38.4* 47.3 37.2* 37.6*  PLT 253 257 246 266    COAGS: No results for input(s): INR, APTT in the last 8760 hours.  BMP: Recent Labs    02/14/19 0337 02/15/19 0502 02/16/19 0357 02/17/19 0349  NA 136 135 134* 135  K 3.8 3.2* 3.5 3.9  CL 106 104 102 103  CO2 21* 21* 22 21*  GLUCOSE 187* 117* 110* 144*  BUN 40* 45* 46* 42*  CALCIUM 8.4* 8.5* 8.6* 8.6*  CREATININE 2.32* 2.31* 2.36* 2.19*  GFRNONAA 32* 33* 32* 35*  GFRAA 38* 38* 37* 40*    LIVER FUNCTION TESTS: Recent Labs    02/28/18 1334 06/15/18 0855 06/18/18 0054 02/12/19  1154 02/15/19 0502 02/16/19 0357 02/17/19 0349  BILITOT 0.7 1.1 0.7 0.8  --   --   --   AST 60* 28 42* 20  --   --   --   ALT 33 19 23 14   --   --   --  ALKPHOS 87 100 82 68  --   --   --   PROT 7.1 6.4* 5.7* 6.5  --   --   --   ALBUMIN 2.8* 2.3* 2.1* 2.2* 2.1* 2.3* 2.2*    TUMOR MARKERS: No results for input(s): AFPTM, CEA, CA199, CHROMGRNA in the last 8760 hours.  Assessment and Plan: Acute renal failure vs. Progressive renal disease IR consulted for inpatient biopsy.  Patient agreeable.  NPO p MN.  Hold lovenox.   Risks and benefits of biopsy was discussed with the patient and/or patient's family including, but not limited to bleeding, infection, damage to adjacent structures or low yield requiring additional tests.  All of the questions were answered and there is agreement to proceed.  Consent signed and in chart.  Thank you for this interesting consult.  I greatly enjoyed meeting Christopher Davila and look forward to participating in their care.  A copy of this report was sent to the requesting provider on this date.  Electronically Signed: Docia Barrier, PA 02/17/2019, 3:58 PM   I spent a total of 40 Minutes    in face to face in clinical consultation, greater than 50% of which was counseling/coordinating care for acute renal failure.

## 2019-02-17 NOTE — Plan of Care (Signed)
  Problem: Nutrition: Goal: Adequate nutrition will be maintained Outcome: Completed/Met   Problem: Coping: Goal: Level of anxiety will decrease Outcome: Completed/Met   

## 2019-02-18 ENCOUNTER — Inpatient Hospital Stay (HOSPITAL_COMMUNITY): Payer: Self-pay

## 2019-02-18 LAB — RENAL FUNCTION PANEL
Albumin: 2 g/dL — ABNORMAL LOW (ref 3.5–5.0)
Anion gap: 11 (ref 5–15)
BUN: 41 mg/dL — ABNORMAL HIGH (ref 6–20)
CO2: 23 mmol/L (ref 22–32)
Calcium: 8.3 mg/dL — ABNORMAL LOW (ref 8.9–10.3)
Chloride: 101 mmol/L (ref 98–111)
Creatinine, Ser: 2.3 mg/dL — ABNORMAL HIGH (ref 0.61–1.24)
GFR calc Af Amer: 38 mL/min — ABNORMAL LOW (ref >60)
GFR calc non Af Amer: 33 mL/min — ABNORMAL LOW (ref >60)
Glucose, Bld: 186 mg/dL — ABNORMAL HIGH (ref 70–99)
Phosphorus: 4.8 mg/dL — ABNORMAL HIGH (ref 2.5–4.6)
Potassium: 3.6 mmol/L (ref 3.5–5.1)
Sodium: 135 mmol/L (ref 135–145)

## 2019-02-18 LAB — CBC
HCT: 36.1 % — ABNORMAL LOW (ref 39.0–52.0)
Hemoglobin: 12.3 g/dL — ABNORMAL LOW (ref 13.0–17.0)
MCH: 29.5 pg (ref 26.0–34.0)
MCHC: 34.1 g/dL (ref 30.0–36.0)
MCV: 86.6 fL (ref 80.0–100.0)
Platelets: 242 10*3/uL (ref 150–400)
RBC: 4.17 MIL/uL — ABNORMAL LOW (ref 4.22–5.81)
RDW: 11.9 % (ref 11.5–15.5)
WBC: 11.6 10*3/uL — ABNORMAL HIGH (ref 4.0–10.5)
nRBC: 0 % (ref 0.0–0.2)

## 2019-02-18 LAB — GLUCOSE, CAPILLARY
Glucose-Capillary: 121 mg/dL — ABNORMAL HIGH (ref 70–99)
Glucose-Capillary: 88 mg/dL (ref 70–99)
Glucose-Capillary: 255 mg/dL — ABNORMAL HIGH (ref 70–99)
Glucose-Capillary: 348 mg/dL — ABNORMAL HIGH (ref 70–99)

## 2019-02-18 LAB — PROTIME-INR
INR: 1 (ref 0.8–1.2)
Prothrombin Time: 12.9 seconds (ref 11.4–15.2)

## 2019-02-18 MED ORDER — HYDRALAZINE HCL 20 MG/ML IJ SOLN
INTRAMUSCULAR | Status: AC
  Filled 2019-02-18: qty 1

## 2019-02-18 MED ORDER — FENTANYL CITRATE (PF) 100 MCG/2ML IJ SOLN
INTRAMUSCULAR | Status: AC | PRN
  Administered 2019-02-18: 50 ug via INTRAVENOUS
  Administered 2019-02-18: 25 ug via INTRAVENOUS

## 2019-02-18 MED ORDER — TRAMADOL HCL 50 MG PO TABS
50.0000 mg | ORAL_TABLET | Freq: Four times a day (QID) | ORAL | Status: DC | PRN
  Administered 2019-02-18 – 2019-02-19 (×2): 50 mg via ORAL
  Filled 2019-02-18 (×2): qty 1

## 2019-02-18 MED ORDER — FENTANYL CITRATE (PF) 100 MCG/2ML IJ SOLN
INTRAMUSCULAR | Status: AC
  Filled 2019-02-18: qty 2

## 2019-02-18 MED ORDER — FUROSEMIDE 80 MG PO TABS
80.0000 mg | ORAL_TABLET | Freq: Two times a day (BID) | ORAL | Status: DC
  Administered 2019-02-18 – 2019-02-20 (×4): 80 mg via ORAL
  Filled 2019-02-18 (×4): qty 1

## 2019-02-18 MED ORDER — HYDRALAZINE HCL 20 MG/ML IJ SOLN
10.0000 mg | Freq: Once | INTRAMUSCULAR | Status: DC
  Filled 2019-02-18: qty 1

## 2019-02-18 MED ORDER — POTASSIUM CHLORIDE CRYS ER 20 MEQ PO TBCR
40.0000 meq | EXTENDED_RELEASE_TABLET | Freq: Once | ORAL | Status: AC
  Administered 2019-02-18: 40 meq via ORAL
  Filled 2019-02-18: qty 2

## 2019-02-18 MED ORDER — ENOXAPARIN SODIUM 40 MG/0.4ML ~~LOC~~ SOLN
40.0000 mg | SUBCUTANEOUS | Status: DC
  Administered 2019-02-18 – 2019-02-20 (×3): 40 mg via SUBCUTANEOUS
  Filled 2019-02-18 (×3): qty 0.4

## 2019-02-18 MED ORDER — MIDAZOLAM HCL 2 MG/2ML IJ SOLN
INTRAMUSCULAR | Status: AC | PRN
  Administered 2019-02-18: 1 mg via INTRAVENOUS

## 2019-02-18 MED ORDER — HYDRALAZINE HCL 20 MG/ML IJ SOLN
INTRAMUSCULAR | Status: AC | PRN
  Administered 2019-02-18: 10 mg via INTRAVENOUS

## 2019-02-18 MED ORDER — INSULIN ASPART 100 UNIT/ML ~~LOC~~ SOLN
7.0000 [IU] | Freq: Once | SUBCUTANEOUS | Status: AC
  Administered 2019-02-18: 22:00:00 7 [IU] via SUBCUTANEOUS

## 2019-02-18 MED ORDER — MIDAZOLAM HCL 2 MG/2ML IJ SOLN
INTRAMUSCULAR | Status: AC
  Filled 2019-02-18: qty 2

## 2019-02-18 MED ORDER — LIDOCAINE-EPINEPHRINE 1 %-1:100000 IJ SOLN
INTRAMUSCULAR | Status: AC
  Filled 2019-02-18: qty 1

## 2019-02-18 NOTE — Plan of Care (Signed)
  Problem: Clinical Measurements: Goal: Will remain free from infection Outcome: Progressing Goal: Diagnostic test results will improve Outcome: Progressing   

## 2019-02-18 NOTE — Progress Notes (Signed)
[facility] NEPHROLOGY PROGRESS NOTE  Assessment/ Plan: Pt is a 48 y.o. yo male with history of HTN, DM with A1c 8.3-9, alcohol abuse with fatty liver, neuropathy, chronic pancreatitis admitted with chest pain, SOB and worsening lower extremity edema.  We are consulted for AKI and proteinuria.  #Acute kidney injury on CKD versus progressive CKD stage III: likely due to diabetic nephropathy/ACEI/HCTZ.  The creatinine level was around 1.5 in 06/2018 and 2.32 on admission. Urinalysis has no RBC, no wbc but with nephrotic range proteinuria.  Ultrasound kidneys showed no obstruction however has echogenic kidney.  -Serum creatinine level 2.3, urine output increased with IV Lasix.  Agree with switching to oral Lasix.  Lower extremity edema is getting better.   -checking secondary work-up of proteinuria as discussed below. -Monitor BMP, urine output.  Avoid nephrotoxins including IV contrast or NSAIDs.  #Nephrotic syndrome likely due to diabetic nephropathy: Patient has 10.5 g of proteinuria associated with chronic hematuria, hypoalbuminemia and lower extremity edema.  This is likely due to uncontrolled diabetic nephropathy. ANA, dsDNA, ANCA, c3, c4, hep B, hep C, kappa lamda ratio unremarkable. -Status post kidney biopsy today, tolerated well.  We will follow-up the result of the biopsy. -He has elevated LDL level, on  Statin.  #Hypokalemia likely due to diuretics:  Monitor lab.  #Acute respiratory failure with hypoxia: May have heart failure with preserved EF.  VQ scan negative for PE.  Not on oxygen currently.  #Chest pain: Resolved now.  #Hypertension: On metoprolol and diuretics.  Holding ACE inhibitor.   Added amlodipine.   Subjective: Seen and examined at bedside.  Had biopsy today, tolerated well.  Increase urine output with IV Lasix.  Lower extremity edema is better.  Able to lie flat.  In room air. Objective Vital signs in last 24 hours: Vitals:   02/18/19 1005  02/18/19 1010 02/18/19 1015 02/18/19 1025  BP: (!) 147/97 (!) 141/96 135/90 136/80  Pulse: 91 91 91 83  Resp: 14 17 17 16   Temp:      TempSrc:      SpO2: 100% 100% 100% 100%  Weight:      Height:       Weight change: -0.68 kg  Intake/Output Summary (Last 24 hours) at 02/18/2019 1205 Last data filed at 02/18/2019 0800 Gross per 24 hour  Intake 480 ml  Output 2800 ml  Net -2320 ml       Labs: Basic Metabolic Panel: Recent Labs  Lab 02/16/19 0357 02/17/19 0349 02/18/19 0342  NA 134* 135 135  K 3.5 3.9 3.6  CL 102 103 101  CO2 22 21* 23  GLUCOSE 110* 144* 186*  BUN 46* 42* 41*  CREATININE 2.36* 2.19* 2.30*  CALCIUM 8.6* 8.6* 8.3*  PHOS 4.4 3.9 4.8*   Liver Function Tests: Recent Labs  Lab 02/12/19 1154  02/16/19 0357 02/17/19 0349 02/18/19 0342  AST 20  --   --   --   --   ALT 14  --   --   --   --   ALKPHOS 68  --   --   --   --   BILITOT 0.8  --   --   --   --   PROT 6.5  --   --   --   --   ALBUMIN 2.2*   < > 2.3* 2.2* 2.0*   < > = values in this interval not displayed.   Recent Labs  Lab 02/12/19 1154  LIPASE 34  No results for input(s): AMMONIA in the last 168 hours. CBC: Recent Labs  Lab 02/12/19 1154 02/12/19 2238 02/13/19 0309 02/15/19 0502 02/18/19 0342  WBC 12.3* 16.8* 14.5* 13.7* 11.6*  HGB 13.1 15.5 12.6* 12.6* 12.3*  HCT 38.4* 47.3 37.2* 37.6* 36.1*  MCV 87.7 91.0 88.8 88.1 86.6  PLT 253 257 246 266 242   Cardiac Enzymes: No results for input(s): CKTOTAL, CKMB, CKMBINDEX, TROPONINI in the last 168 hours. CBG: Recent Labs  Lab 02/17/19 0740 02/17/19 1226 02/17/19 1552 02/17/19 2121 02/18/19 0858  GLUCAP 157* 105* 217* 267* 121*    Iron Studies: No results for input(s): IRON, TIBC, TRANSFERRIN, FERRITIN in the last 72 hours. Studies/Results: US Biopsy (kidney)  Result Date: 02/18/2019 INDICATION: Acute renal insufficiency of uncertain etiology. Please perform ultrasound-guided random renal biopsy for tissue diagnostic  purposes. EXAM: ULTRASOUND GUIDED RENAL BIOPSY COMPARISON:  Renal ultrasound-02/14/2019 MEDICATIONS: None. ANESTHESIA/SEDATION: Fentanyl 75 mcg IV; Versed 1 mg IV Total Moderate Sedation time: 10 minutes; The patient was continuously monitored during the procedure by the interventional radiology nurse under my direct supervision. COMPLICATIONS: None immediate. PROCEDURE: Informed written consent was obtained from the patient after a discussion of the risks, benefits and alternatives to treatment. The patient understands and consents the procedure. A timeout was performed prior to the initiation of the procedure. Ultrasound scanning was performed of the bilateral flanks. The inferior pole of the left kidney was selected for biopsy due to location and sonographic window. The procedure was planned. The operative site was prepped and draped in the usual sterile fashion. The overlying soft tissues were anesthetized with 1% lidocaine with epinephrine. A 17 gauge core needle biopsy device was advanced into the inferior cortex of the left kidney and 3 core biopsies were obtained under direct ultrasound guidance. Images were saved for documentation purposes. The biopsy device was removed and hemostasis was obtained with manual compression. Post procedural scanning was negative for significant post procedural hemorrhage or additional complication. A dressing was placed. The patient tolerated the procedure well without immediate post procedural complication. IMPRESSION: Technically successful ultrasound guided left renal biopsy. Electronically Signed   By: Sandi Mariscal M.D.   On: 02/18/2019 11:06    Medications: Infusions:   Scheduled Medications: . amLODipine  10 mg Oral Daily  . atorvastatin  40 mg Oral Daily  . clotrimazole   Topical BID  . [START ON 02/19/2019] enoxaparin (LOVENOX) injection  40 mg Subcutaneous Q24H  . fentaNYL      . furosemide  80 mg Oral BID  . gabapentin  300 mg Oral TID  . hydrALAZINE       . hydrALAZINE  10 mg Intravenous Once  . insulin aspart  0-9 Units Subcutaneous TID WC  . insulin glargine  15 Units Subcutaneous Daily  . lidocaine-EPINEPHrine      . metoprolol tartrate  50 mg Oral BID  . midazolam      . pantoprazole  80 mg Oral Daily  . polyethylene glycol  17 g Oral Daily  . potassium chloride  40 mEq Oral Once  . ramelteon  8 mg Oral QHS  . sodium chloride flush  3 mL Intravenous Once    have reviewed scheduled and prn medications.  Physical Exam: General:NAD, comfortable Heart:RRR, s1s2 nl, no rub Lungs: Clear b/l, no crackle, no wheeze Abdomen:soft, Non-tender, non-distended Extremities: Lower extremity edema+ Neurology: Alert awake and following commands  Cylah Fannin [name] 02/18/2019,12:05 PM  LOS: 6 days  Pager: ID:[phone]

## 2019-02-18 NOTE — Procedures (Signed)
Pre Procedure Dx: Progressive renal disease Post Procedural Dx: Same  Technically successful US guided biopsy of inferior pole of the left kidney  EBL: Trace No immediate complications.   Ronny Bacon, MD Pager #: [phone]

## 2019-02-18 NOTE — Progress Notes (Signed)
Family Medicine Teaching Service Daily Progress Note Intern Pager: [phone]  Patient name: Christopher Davila record number: [id number] Date of birth: 11/02/1970 Age: 48 y.o. Gender: male  Primary Care Provider: Marliss Coots, NP Consultants: None Code Status: FULL  Pt Overview and Major Events to Date:  08/28: Admitted for dyspnea 08/29: cardiology curb-sided and do not believe this is ACS 9/3: Kidney biopsy  Assessment and Plan: Breylan Lefevers is a 48 y.o. male presenting with dyspnea secondary to AKI with nephrotic syndrome. PMH is significant for T2 DM, HTN, reports fatty liver, HLD, EtOH use.  Dyspnea with new O2 requirement Patient satting 99% on room air. Pitting edema 2+ to mid calf bilaterally  Patient lost 1.5 pounds yesterday, weight now 152 pounds.  Chest x-ray with bilateral pleural effusions. VQ scan with no signs of PE.  Patient started on prednisone 40 mg and azithromycin on 8/28 by PCP. Continued here and ended on September 1.  Patient with no history of asthma or COPD and no oxygen requirement at baseline.  However does have extensive smoking history.  He is not complaining of any dyspnea today, reports he feels well.  -Strict I/Os -Daily weight -Continuous O2 monitoring -Continue albuterol and ipratropium nebulizers every 4 hours PRN - S/p azithromycin,(8/28-9/1) -S/p prednisone 40 mg daily, (8/28-9/1) -PT/OT signed off  AKI with nephrotic syndrome Patient's creatinine on admission was 2.33, now 2.30 on 9/2. Patient's baseline runs approximately 1.5. UA with >300 protein, glucose and blood with 6-10 RBCs seen on micro. In ED he received 40 and 80 mg IV lasix, after receiving 2 L in ED. On 8/31 he received 120 mg IV Lasix in the morning, nephrology requested him to be on 40 mg Lasix twice daily, which we have continued. UOP today 2.6L with improved fluid status on exam. LE edema decreasing to 2+ pitting in mid shins from above knee yesterday. Plan to switch from IV  to PO lasix. Pt had 10.5 g of proteinuria associated w/ chronic hematuria, hypoalbuminemia, and LE edema.  Patient had kidney biopsy today, will follow up nephrology recommendations and biopsy results.  Etiology is likely 2/2 uncontrolled diabetic nephropathy; other secondary causes including ANA, ANCA, complements, free light chain ruled out at this time. He also has hypokalemia, likely due to diuretics, K 3.6 today. - start 80 mg Lasix PO twice daily  -STRICT I/Os -Daily weight - f/u kidney bx -Hold patient's lisinopril and HCTZ in setting of AKI -Renal US no obstruction, but echogenic and FEUrea 45.9% indicating intrinsic etiology -24-hour protein urine 10,560 -Nephrology consulted, appreciate recommendations -Trend BMP / urinary output -Replace electrolytes as indicated > 1x K-DUR 46mq given -Avoid nephrotoxic agents (IV contrast, NSAIDs, etc) ensure adequate renal perfusion  Elevated troponins   Lower extremity edema BNP elevated to 567.4.  Echo with EF of 55 to 60% with pseudonormalization noted.  EKG with tachycardia and inferior T wave abnormality that was previously seen. Troponin 40>90>107> 83>83.  Spoke with cardiologist, Dr. ARayann Hemanover the phone on 8/30 and reports that given he has just had a VQ scan he would not be eligible for stress test at this time but he believes that this is most likely related to kidney injury.  States that he may need follow-up outpatient for a stress test but he does not believe that he needs to be followed here in the hospital.  Reports that if he does not improve or does worsen significantly than we may consult them at that time. -Daily weights, strict  I's and O's -Elevate legs  Abdominal pain, improved Patient reports that his abdominal pain has improved along with his shortness of breath. KUB was normal. Abdominal xray was normal. Possible GERD. -Tylenol 658m q6h - Protonix 80 mg daily - Monitor with serial abdominal exams - Consider CT scan  with worsening.  Diabetes type 2 with neuropathy Hemoglobin A1c is 8.3 home medication includes metformin 500 mg twice daily. CBGs have recently ranged from 144-357.  Likely worsening in setting of steroid burst, which ended on 9/1. Today BG from 105-267. - Monitor CBGs - continue Lantus 15 units - mSSI -lotrimin cream for toe rash on feet  HTN Pressures over the last 24 hours have ranged from 140s over 100s to 170s over 110s. Home medications include Norvasc 10 mg, Zestoretic 20-12.5 daily, metoprolol 50 mg twice daily. Continued diuresis likely to help decrease BP,  will watch for improvement.  Home amlodipine 10 mg daily restarted. -Start amlodipine 10 mg daily - Monitor blood pressures, likely to improve with diuresis - Hold HCTZ/lisinopril in setting of AKI  Reported fatty liver Admission labs showed albumin AST-20, ALT-14, lipase-34, alk phos- 68.  Albumin 2.2.   HLD Home medications include Lipitor 40 mg daily. Total chol 244. Triglycerides 182. LDL 136. - Continue home Lipitor dose  Alcohol abuse Patient drinks anywhere from 2 beers to 12 pack on a given day.  Also has a history of alcoholic pancreatitis, and alcoholic gastritis and has been seen in ED for this multiple times.  Reports never being admitted to the hospital for alcohol withdrawal.  Does not display any symptoms of active withdrawal at this time. -Monitor CIWA scores without Ativan -CIWA scores 0   FEN/GI: Carb modified, 2 g sodium diet Prophylaxis: Lovenox 40 mg daily   Disposition: Pending improved respiratory status and nephrotic syndrome work up  Subjective:  Patient doing well this morning.  Pitting edema 2+ to right hip, and left mid thigh. He does not think he has more volume in his abdomen than is usual for him.   Otherwise doing well.  Denies any shortness of breath or chest pain.  Objective: Temp:  [97.8 F (36.6 C)-98.5 F (36.9 C)] 98.5 F (36.9 C) (09/03 0515) Pulse Rate:  [83-96] 83  (09/03 1025) Resp:  [14-18] 16 (09/03 1025) BP: (127-171)/(76-119) 153/98 (09/03 1150) SpO2:  [98 %-100 %] 100 % (09/03 1025) Weight:  [68.9 kg] 68.9 kg (09/03 0515)   Physical Exam: General: NAD, resting comfortably in bed. Cardiovascular: RRR, no m/r/g, +2 pitting edema to mid calf Respiratory: Normal work of breathing, clear to auscultation bilaterally Gastrointestinal: soft, nontender, nondistended MSK: moves 4 extremities equally Derm: no rashes appreciated Neuro: CN II-XII grossly intact  Psych: AOx3, appropriate affect  Laboratory: Recent Labs  Lab 02/13/19 0309 02/15/19 0502 02/18/19 0342  WBC 14.5* 13.7* 11.6*  HGB 12.6* 12.6* 12.3*  HCT 37.2* 37.6* 36.1*  PLT 246 266 242   Recent Labs  Lab 02/12/19 1154  02/16/19 0357 02/17/19 0349 02/18/19 0342  NA 133*   < > 134* 135 135  K 3.2*   < > 3.5 3.9 3.6  CL 100   < > 102 103 101  CO2 20*   < > 22 21* 23  BUN 30*   < > 46* 42* 41*  CREATININE 2.33*   < > 2.36* 2.19* 2.30*  CALCIUM 8.4*   < > 8.6* 8.6* 8.3*  PROT 6.5  --   --   --   --  BILITOT 0.8  --   --   --   --   ALKPHOS 68  --   --   --   --   ALT 14  --   --   --   --   AST 20  --   --   --   --   GLUCOSE 446*   < > 110* 144* 186*   < > = values in this interval not displayed.   Lipid panel [[id number]] (Abnormal) Collected: 02/12/19 2238  Specimen: Blood from Hand Updated: 02/12/19 2338   Cholesterol 244High  mg/dL    Triglycerides 182High  mg/dL    HDL 72 mg/dL    Total CHOL/HDL Ratio 3.4 RATIO    VLDL 36 mg/dL    LDL Cholesterol 136High  mg/dL    Hemoglobin A1c [[id number]] (Abnormal) Collected: 02/12/19 2238  Specimen: Blood from Hand Updated: 02/12/19 2314   Hgb A1c MFr Bld 8.3High  %      Imaging/Diagnostic Tests: US Biopsy (kidney)  Result Date: 02/18/2019 INDICATION: Acute renal insufficiency of uncertain etiology. Please perform ultrasound-guided random renal biopsy for tissue diagnostic purposes. EXAM: ULTRASOUND GUIDED RENAL BIOPSY  COMPARISON:  Renal ultrasound-02/14/2019 MEDICATIONS: None. ANESTHESIA/SEDATION: Fentanyl 75 mcg IV; Versed 1 mg IV Total Moderate Sedation time: 10 minutes; The patient was continuously monitored during the procedure by the interventional radiology nurse under my direct supervision. COMPLICATIONS: None immediate. PROCEDURE: Informed written consent was obtained from the patient after a discussion of the risks, benefits and alternatives to treatment. The patient understands and consents the procedure. A timeout was performed prior to the initiation of the procedure. Ultrasound scanning was performed of the bilateral flanks. The inferior pole of the left kidney was selected for biopsy due to location and sonographic window. The procedure was planned. The operative site was prepped and draped in the usual sterile fashion. The overlying soft tissues were anesthetized with 1% lidocaine with epinephrine. A 17 gauge core needle biopsy device was advanced into the inferior cortex of the left kidney and 3 core biopsies were obtained under direct ultrasound guidance. Images were saved for documentation purposes. The biopsy device was removed and hemostasis was obtained with manual compression. Post procedural scanning was negative for significant post procedural hemorrhage or additional complication. A dressing was placed. The patient tolerated the procedure well without immediate post procedural complication. IMPRESSION: Technically successful ultrasound guided left renal biopsy. Electronically Signed   By: Sandi Mariscal M.D.   On: 02/18/2019 11:06   CHEST - 2 VIEW COMPARISON: PA and lateral chest 06/18/2018. Lungs are clear. There are very small bilateral pleural effusions. Heart size is normal. No pneumothorax. No acute or focal bony abnormality. IMPRESSION: Small bilateral pleural effusions. Otherwise negative.   PORTABLE CHEST 1 VIEW COMPARISON: Chest radiograph dated 02/12/2019 and CT  dated 12/21/2007. IMPRESSION: 1. No focal consolidation. 2. No evidence of bowel obstruction.  PORTABLE CHEST 1 VIEW COMPARISON: Chest radiograph dated 02/12/2019 and CT dated 12/21/2007. IMPRESSION: 1. No focal consolidation. 2. No evidence of bowel obstruction.  NUCLEAR MEDICINE PERFUSION LUNG SCAN COMPARISON: Chest radiograph 02/12/2019 IMPRESSION: No evidence of pulmonary embolism.  Gladys Damme, MD 02/18/2019, 2:07 PM PGY-1, [place] Intern pager: [phone], text pages welcome

## 2019-02-18 NOTE — Progress Notes (Signed)
Inpatient Diabetes Program Recommendations  AACE/ADA: New Consensus Statement on Inpatient Glycemic Control (2015)  Target Ranges:  Prepandial:   less than 140 mg/dL      Peak postprandial:   less than 180 mg/dL (1-2 hours)      Critically ill patients:  140 - 180 mg/dL   Lab Results  Component Value Date   GLUCAP 121 (H) 02/18/2019   HGBA1C 8.3 (H) 02/12/2019    Review of Glycemic Control Results for Christopher Davila, Christopher Davila (MRN [id number]) as of 02/18/2019 10:49  Ref. Range 02/17/2019 15:52 02/17/2019 21:21 02/18/2019 08:58  Glucose-Capillary Latest Ref Range: 70 - 99 mg/dL 217 (H) 267 (H) 121 (H)   Diabetes history: Type 2 DM Outpatient Diabetes medications: Metformin 500 mg BID Current orders for Inpatient glycemic control: Novolog 0-9 units TID, Lantus 15 units QD  Inpatient Diabetes Program Recommendations:    Consider switching diet to carb modified diet.   Thanks, Bronson Curb, MSN, RNC-OB Diabetes Coordinator [phone] (8a-5p)

## 2019-02-19 DIAGNOSIS — N184 Chronic kidney disease, stage 4 (severe): Secondary | ICD-10-CM

## 2019-02-19 LAB — BASIC METABOLIC PANEL
Anion gap: 9 (ref 5–15)
BUN: 34 mg/dL — ABNORMAL HIGH (ref 6–20)
CO2: 25 mmol/L (ref 22–32)
Calcium: 8.3 mg/dL — ABNORMAL LOW (ref 8.9–10.3)
Chloride: 101 mmol/L (ref 98–111)
Creatinine, Ser: 2.13 mg/dL — ABNORMAL HIGH (ref 0.61–1.24)
GFR calc Af Amer: 42 mL/min — ABNORMAL LOW (ref >60)
GFR calc non Af Amer: 36 mL/min — ABNORMAL LOW (ref >60)
Glucose, Bld: 154 mg/dL — ABNORMAL HIGH (ref 70–99)
Potassium: 3.8 mmol/L (ref 3.5–5.1)
Sodium: 135 mmol/L (ref 135–145)

## 2019-02-19 LAB — CBC
HCT: 40.1 % (ref 39.0–52.0)
Hemoglobin: 13.3 g/dL (ref 13.0–17.0)
MCH: 29.4 pg (ref 26.0–34.0)
MCHC: 33.2 g/dL (ref 30.0–36.0)
MCV: 88.5 fL (ref 80.0–100.0)
Platelets: 248 10*3/uL (ref 150–400)
RBC: 4.53 MIL/uL (ref 4.22–5.81)
RDW: 11.9 % (ref 11.5–15.5)
WBC: 10.8 10*3/uL — ABNORMAL HIGH (ref 4.0–10.5)
nRBC: 0 % (ref 0.0–0.2)

## 2019-02-19 LAB — GLUCOSE, CAPILLARY
Glucose-Capillary: 200 mg/dL — ABNORMAL HIGH (ref 70–99)
Glucose-Capillary: 223 mg/dL — ABNORMAL HIGH (ref 70–99)
Glucose-Capillary: 168 mg/dL — ABNORMAL HIGH (ref 70–99)
Glucose-Capillary: 298 mg/dL — ABNORMAL HIGH (ref 70–99)

## 2019-02-19 LAB — PHOSPHORUS: Phosphorus: 4.1 mg/dL (ref 2.5–4.6)

## 2019-02-19 MED ORDER — HYDRALAZINE HCL 10 MG PO TABS: 10.0000 mg | ORAL_TABLET | Freq: Three times a day (TID) | ORAL | 0 refills | Status: DC

## 2019-02-19 MED ORDER — CARVEDILOL 25 MG PO TABS
25.0000 mg | ORAL_TABLET | Freq: Two times a day (BID) | ORAL | Status: DC
  Administered 2019-02-19 – 2019-02-20 (×2): 25 mg via ORAL
  Filled 2019-02-19 (×2): qty 1

## 2019-02-19 MED ORDER — CLOTRIMAZOLE 1 % EX CREA: TOPICAL_CREAM | Freq: Two times a day (BID) | CUTANEOUS | 0 refills | Status: DC

## 2019-02-19 MED ORDER — PANTOPRAZOLE SODIUM 40 MG PO TBEC: 80.0000 mg | DELAYED_RELEASE_TABLET | Freq: Every day | ORAL | 0 refills | Status: DC

## 2019-02-19 MED ORDER — POTASSIUM CHLORIDE CRYS ER 20 MEQ PO TBCR: 20.0000 meq | EXTENDED_RELEASE_TABLET | Freq: Every day | ORAL | 0 refills | Status: DC

## 2019-02-19 MED ORDER — HYDRALAZINE HCL 10 MG PO TABS: 10.0000 mg | ORAL_TABLET | Freq: Three times a day (TID) | ORAL | Status: DC

## 2019-02-19 MED ORDER — ATORVASTATIN CALCIUM 80 MG PO TABS: 80.0000 mg | ORAL_TABLET | Freq: Every day | ORAL | 0 refills | Status: DC

## 2019-02-19 MED ORDER — HYDRALAZINE HCL 10 MG PO TABS
10.0000 mg | ORAL_TABLET | Freq: Three times a day (TID) | ORAL | Status: DC
  Administered 2019-02-19 – 2019-02-20 (×4): 10 mg via ORAL
  Filled 2019-02-19 (×5): qty 1

## 2019-02-19 MED ORDER — POTASSIUM CHLORIDE CRYS ER 20 MEQ PO TBCR
20.0000 meq | EXTENDED_RELEASE_TABLET | Freq: Every day | ORAL | Status: DC
  Administered 2019-02-19 – 2019-02-20 (×2): 20 meq via ORAL
  Filled 2019-02-19 (×2): qty 1

## 2019-02-19 MED ORDER — FUROSEMIDE 80 MG PO TABS: 80.0000 mg | ORAL_TABLET | Freq: Two times a day (BID) | ORAL | 0 refills | Status: DC

## 2019-02-19 MED ORDER — GABAPENTIN 300 MG PO CAPS: 300.0000 mg | ORAL_CAPSULE | Freq: Three times a day (TID) | ORAL | 0 refills | Status: DC

## 2019-02-19 MED ORDER — PNEUMOCOCCAL VAC POLYVALENT 25 MCG/0.5ML IJ INJ
0.5000 mL | INJECTION | INTRAMUSCULAR | Status: AC
  Administered 2019-02-20: 0.5 mL via INTRAMUSCULAR
  Filled 2019-02-19: qty 0.5

## 2019-02-19 MED ORDER — CARVEDILOL 25 MG PO TABS: 25.0000 mg | ORAL_TABLET | Freq: Two times a day (BID) | ORAL | 0 refills | Status: DC

## 2019-02-19 MED ORDER — CARVEDILOL 12.5 MG PO TABS: 12.5000 mg | ORAL_TABLET | Freq: Two times a day (BID) | ORAL | Status: DC

## 2019-02-19 MED FILL — FUROSEMIDE 80 MG TAB: 80 | 30 days supply | Qty: 60 | Fill #0

## 2019-02-19 MED FILL — CLOTRIMAZOLE 1% CREAM: 1 | 15 days supply | Qty: 28 | Fill #0

## 2019-02-19 MED FILL — CARVEDILOL 25 MG TABLET: 25 | 30 days supply | Qty: 60 | Fill #0

## 2019-02-19 MED FILL — hydrALAZINE HCL 10 MG TABS: 10 | 30 days supply | Qty: 90 | Fill #0

## 2019-02-19 MED FILL — PANTOPRAZOLE SOD DR 40 MG T: 40 | 30 days supply | Qty: 60 | Fill #0

## 2019-02-19 MED FILL — POTASSIUM CL ER 20 MEQ TABL: 20 | 30 days supply | Qty: 30 | Fill #0

## 2019-02-19 MED FILL — GABAPENTIN 300 MG CAPSULE: 300 | 30 days supply | Qty: 90 | Fill #0

## 2019-02-19 MED FILL — ATORVASTATIN CALCIUM 80 MG: 80 | 30 days supply | Qty: 30 | Fill #0

## 2019-02-19 NOTE — TOC Transition Note (Signed)
Transition of Care [facility]) - CM/SW Discharge Note   Patient Details  Name: Christopher Davila MRN: [id number] Date of Birth: [birth year]  Transition of Care [facility]) CM/SW Contact:  Bethena Roys, RN Phone Number: 02/19/2019, 1:15 PM   Clinical Narrative: Pt presented for dyspnea secondary to AKI and ETOH abuse. PTA from home and patient has PCP at [place]. Patient wants to continue to see her at the [facility] and he states he has an upcoming appointment scheduled. Patient states he gets medications from [facility] Department. CM did discuss with physician to send all medications to [place] today, so patient can get medications before he transitions home on Sat. CM did alert [place] to see if patient has used MATCH in the past. Will continue to follow for additional transition of care needs.    Final next level of care: Home/Self Care Barriers to Discharge: No Barriers Identified   Patient Goals and CMS Choice Patient states their goals for this hospitalization and ongoing recovery are:: to get better   Choice offered to / list presented to : NA   Discharge Plan and Services In-house Referral: NA Discharge Planning Services: CM Consult Post Acute Care Choice: NA             HH Arranged: NA   Readmission Risk Interventions Readmission Risk Prevention Plan 02/19/2019  Transportation Screening Complete  PCP or Specialist Appt within 5-7 Days Complete  Home Care Screening Complete  Medication Review (RN CM) Complete  Some recent data might be hidden

## 2019-02-19 NOTE — Progress Notes (Addendum)
Inpatient Diabetes Program Recommendations  AACE/ADA: New Consensus Statement on Inpatient Glycemic Control (2015)  Target Ranges:  Prepandial:   less than 140 mg/dL      Peak postprandial:   less than 180 mg/dL (1-2 hours)      Critically ill patients:  140 - 180 mg/dL   Lab Results  Component Value Date   GLUCAP 223 (H) 02/19/2019   HGBA1C 8.3 (H) 02/12/2019    Review of Glycemic Control Results for Christopher Davila, Christopher Davila (MRN [id number]) as of 02/19/2019 13:40  Ref. Range 02/18/2019 16:11 02/18/2019 21:03 02/19/2019 07:54 02/19/2019 11:11  Glucose-Capillary Latest Ref Range: 70 - 99 mg/dL 255 (H) 348 (H) 200 (H) 223 (H)   Diabetes history: Type 2 DM Outpatient Diabetes medications: Metformin 500 mg BID Current orders for Inpatient glycemic control: Novolog 0-9 units TID, Lantus 15 units QD  Inpatient Diabetes Program Recommendations:    Noted consult. Patient did not received Lantus on 9/3, thus possibly explaining reason for increased glucose trends in the afternoon. If post prandials continue to exceed 180 mg/dL, could consider Novolog 3 units TID (assuming patient is consuming >50% of meals).   Addendum@1400 : Spoke with patient regarding outpatient DM management. Verifies home dosages.  Reviewed patient's current A1c of 8.3%. Explained what a A1c is and what it measures. Also reviewed goal A1c with patient, importance of good glucose control @ home, and blood sugar goals. Reviewed patho of DM, need for better control, role of pancreas, impact to overall health, vascular changes and co-morbidies. Patient has a meter and "occasionally" uses it. Encouraged to begin checking 2 times per day. Reviewed frequency and target goals as well as when to call MD.  Admits to drinking a lot of Gatorade and juices. Reviewed alternatives and encouraged mindfulness of CHO intake. Additionally, reviewed ADA recommendations for increasing activity as he feels able. Stressed the importance of follow up with PCP in the  next month for DM. Patient has no further questions at this time.    Thanks, Bronson Curb, MSN, RNC-OB Diabetes Coordinator [phone] (8a-5p)

## 2019-02-19 NOTE — TOC Transition Note (Signed)
Transition of Care [facility]) - CM/SW Discharge Note   Patient Details  Name: Christopher Davila MRN: [id number] Date of Birth: 23-Jul-1970  Transition of Care [facility]) CM/SW Contact:  Bethena Roys, RN Phone Number: 02/19/2019, 2:24 PM   Clinical Narrative:  MATCH completed for the patient. Medications have been sent to the Transitions of care pharmacy. Patient will not have a co pay for medications. Per MD- plan for transition home the weekend. Pt states he has a follow up appointment with Marliss Coots. No further needs from CM at this time.   Final next level of care: Home/Self Care Barriers to Discharge: No Barriers Identified   Patient Goals and CMS Choice Patient states their goals for this hospitalization and ongoing recovery are:: to get better   Choice offered to / list presented to : NA   Discharge Plan and Services In-house Referral: NA Discharge Planning Services: CM Consult Post Acute Care Choice: NA            HH Arranged: NA    Social Determinants of Health (SDOH) Interventions     Readmission Risk Interventions Readmission Risk Prevention Plan 02/19/2019  Transportation Screening Complete  PCP or Specialist Appt within 5-7 Days Complete  Home Care Screening Complete  Medication Review (RN CM) Complete  Some recent data might be hidden

## 2019-02-19 NOTE — Progress Notes (Signed)
Family Medicine Teaching Service Daily Progress Note Intern Pager: [phone]  Patient name: Christopher Davila record number: [id number] Date of birth: 1971/06/13 Age: 48 y.o. Gender: male  Primary Care Provider: Marliss Coots, NP Consultants: None Code Status: FULL  Pt Overview and Major Events to Date:  08/28: Admitted for dyspnea 08/29: cardiology curb-sided and do not believe this is ACS 9/3: Kidney biopsy  Assessment and Plan: Christopher Davila is a 48 y.o. male presenting with dyspnea secondary to AKI with nephrotic syndrome. PMH is significant for T2 DM, HTN, reports fatty liver, HLD, EtOH use.  AKI with nephrotic syndrome Patient's creatinine on admission was 2.33, now 2.13 on 9/4. Patient's baseline runs approximately 1.5. UA with >300 protein, glucose and blood with 6-10 RBCs seen on micro. On Lasix 80 mg PO BID. UOP today 1.9L with improved fluid status on exam. LE edema decreasing to 2+ pitting at the mid calf. He also has hypokalemia, likely due to diuretics, K 3.8 today. - continue 80 mg Lasix PO twice daily  -STRICT I/Os -Daily weight - f/u kidney bx -Hold patient's lisinopril and HCTZ in setting of AKI -Renal US no obstruction, but echogenic and FEUrea 45.9% indicating intrinsic etiology -24-hour protein urine 10,560 -Nephrology consulted, appreciate recommendations -Trend BMP / urinary output -Replace electrolytes as indicated  -Avoid nephrotoxic agents (IV contrast, NSAIDs, etc) ensure adequate renal perfusion  Dyspnea with new O2 requirement- resolved Patient satting 99% on room air. Pitting edema 2+ to mid calf. Patient lost 3.8 pounds yesterday, weight now 148.2 pounds. He is not complaining of any dyspnea today, reports he feels well.  -Strict I/Os -Daily weight -Continuous O2 monitoring -Continue albuterol and ipratropium nebulizers every 4 hours PRN - S/p azithromycin,(8/28-9/1) -S/p prednisone 40 mg daily, (8/28-9/1) -PT/OT signed off  Elevated  troponins   Lower extremity edema -resolved Resolved.  -Daily weights, strict I's and O's -Elevate legs  Abdominal pain, resolved Resolved. No pain today. -Tylenol 624m q6h - Protonix 80 mg daily - Monitor with serial abdominal exams - Consider CT scan with worsening.  Diabetes type 2 with neuropathy Hemoglobin A1c is 8.3 home medication includes metformin 500 mg twice daily. Will hold off on increasing metformin until he reaches cr baseline. PCP can do this. CBGs have recently ranged from 154-348. He received 7U short-acting with dinner. BG improved s/p steroid burst, which ended on 9/1.  - Monitor CBGs - continue Lantus 15 units - mSSI -lotrimin cream for toe rash on feet  HTN Pressures over the last 24 hours have ranged from 120s-170s over 70s-110s. Home medications include Norvasc 10 mg, Zestoretic 20-12.5 daily, metoprolol 50 mg twice daily. Continued diuresis likely to help decrease BP,  will watch for improvement.  Home amlodipine 10 mg daily restarted. -Discontinue metoprolol 50 mg -Start Coreg 12.5 mg PO BID -continue amlodipine 10 mg daily - Monitor blood pressures, likely to improve with diuresis - Hold HCTZ/lisinopril in setting of AKI  Reported fatty liver Admission labs showed albumin AST-20, ALT-14, lipase-34, alk phos- 68.  Albumin 2.2.   HLD Total chol 244. Triglycerides 182. LDL 136. AST/ALT WNL. Will increase Lioitor to 80n mg daily to maximize protection as he is at high risk of CVD. - Increase Lipitor to 80 mg qd  Alcohol abuse Has a h/o abuse. CIWA scores remain 0. Not withdrawing this hospital admission. -Monitor CIWA scores without Ativan  FEN/GI: Carb modified, 2 g sodium diet Prophylaxis: Lovenox 40 mg daily   Disposition: To home today as nephrotic syndrome  work up completed and he is much improved  Subjective:  Patient doing well this morning.  Pitting edema 2+ to mid calf. Denies any shortness of breath or chest pain. Discussed  importance of counting sodium in his diet and keeping to <2g/d.  Objective: Temp:  [98.1 F (36.7 C)-98.6 F (37 C)] 98.1 F (36.7 C) (09/04 0531) Pulse Rate:  [83-97] 93 (09/04 0531) Resp:  [14-20] 18 (09/04 0531) BP: (127-169)/(76-119) 169/112 (09/04 0531) SpO2:  [97 %-100 %] 97 % (09/04 0531) Weight:  [67.2 kg] 67.2 kg (09/04 0529)   Physical Exam: General: NAD, resting comfortably on the side of his bed Cardiovascular: RRR, no m/r/g, +2 pitting edema to mid calf Respiratory: Normal work of breathing, clear to auscultation bilaterally Gastrointestinal: soft, nontender, nondistended MSK: moves 4 extremities equally Derm: no rashes appreciated Neuro: CN II-XII grossly intact  Psych: AOx3, appropriate affect  Laboratory: Recent Labs  Lab 02/15/19 0502 02/18/19 0342 02/19/19 0806  WBC 13.7* 11.6* 10.8*  HGB 12.6* 12.3* 13.3  HCT 37.6* 36.1* 40.1  PLT 266 242 248   Recent Labs  Lab 02/12/19 1154  02/17/19 0349 02/18/19 0342 02/19/19 0353  NA 133*   < > 135 135 135  K 3.2*   < > 3.9 3.6 3.8  CL 100   < > 103 101 101  CO2 20*   < > 21* 23 25  BUN 30*   < > 42* 41* 34*  CREATININE 2.33*   < > 2.19* 2.30* 2.13*  CALCIUM 8.4*   < > 8.6* 8.3* 8.3*  PROT 6.5  --   --   --   --   BILITOT 0.8  --   --   --   --   ALKPHOS 68  --   --   --   --   ALT 14  --   --   --   --   AST 20  --   --   --   --   GLUCOSE 446*   < > 144* 186* 154*   < > = values in this interval not displayed.   Lipid panel [[id number]] (Abnormal) Collected: 02/12/19 2238  Specimen: Blood from Hand Updated: 02/12/19 2338   Cholesterol 244High  mg/dL    Triglycerides 182High  mg/dL    HDL 72 mg/dL    Total CHOL/HDL Ratio 3.4 RATIO    VLDL 36 mg/dL    LDL Cholesterol 136High  mg/dL    Hemoglobin A1c [[id number]] (Abnormal) Collected: 02/12/19 2238  Specimen: Blood from Hand Updated: 02/12/19 2314   Hgb A1c MFr Bld 8.3High  %      Imaging/Diagnostic Tests:  US Biopsy (kidney)  Result Date:  02/18/2019 INDICATION: Acute renal insufficiency of uncertain etiology. Please perform ultrasound-guided random renal biopsy for tissue diagnostic purposes. EXAM: ULTRASOUND GUIDED RENAL BIOPSY COMPARISON:  Renal ultrasound-02/14/2019 MEDICATIONS: None. ANESTHESIA/SEDATION: Fentanyl 75 mcg IV; Versed 1 mg IV Total Moderate Sedation time: 10 minutes; The patient was continuously monitored during the procedure by the interventional radiology nurse under my direct supervision. COMPLICATIONS: None immediate. PROCEDURE: Informed written consent was obtained from the patient after a discussion of the risks, benefits and alternatives to treatment. The patient understands and consents the procedure. A timeout was performed prior to the initiation of the procedure. Ultrasound scanning was performed of the bilateral flanks. The inferior pole of the left kidney was selected for biopsy due to location and sonographic window. The procedure was planned. The operative site was  prepped and draped in the usual sterile fashion. The overlying soft tissues were anesthetized with 1% lidocaine with epinephrine. A 17 gauge core needle biopsy device was advanced into the inferior cortex of the left kidney and 3 core biopsies were obtained under direct ultrasound guidance. Images were saved for documentation purposes. The biopsy device was removed and hemostasis was obtained with manual compression. Post procedural scanning was negative for significant post procedural hemorrhage or additional complication. A dressing was placed. The patient tolerated the procedure well without immediate post procedural complication. IMPRESSION: Technically successful ultrasound guided left renal biopsy. Electronically Signed   By: Sandi Mariscal M.D.   On: 02/18/2019 11:06   CHEST - 2 VIEW COMPARISON: PA and lateral chest 06/18/2018. Lungs are clear. There are very small bilateral pleural effusions. Heart size is normal. No pneumothorax. No acute or focal  bony abnormality. IMPRESSION: Small bilateral pleural effusions. Otherwise negative.   PORTABLE CHEST 1 VIEW COMPARISON: Chest radiograph dated 02/12/2019 and CT dated 12/21/2007. IMPRESSION: 1. No focal consolidation. 2. No evidence of bowel obstruction.  PORTABLE CHEST 1 VIEW COMPARISON: Chest radiograph dated 02/12/2019 and CT dated 12/21/2007. IMPRESSION: 1. No focal consolidation. 2. No evidence of bowel obstruction.  NUCLEAR MEDICINE PERFUSION LUNG SCAN COMPARISON: Chest radiograph 02/12/2019 IMPRESSION: No evidence of pulmonary embolism.  Gladys Damme, MD 02/19/2019, 9:11 AM PGY-1, [place] Intern pager: [phone], text pages welcome

## 2019-02-19 NOTE — Plan of Care (Signed)

## 2019-02-19 NOTE — Progress Notes (Addendum)
[facility] NEPHROLOGY PROGRESS NOTE  Assessment/ Plan: Pt is a 48 y.o. yo male with history of HTN, DM with A1c 8.3-9, alcohol abuse with fatty liver, neuropathy, chronic pancreatitis admitted with chest pain, SOB and worsening lower extremity edema.  We are consulted for AKI and proteinuria.  #Acute kidney injury on CKD versus progressive CKD stage III, non-oliguric: likely due to diabetic nephropathy/ACEI/HCTZ.  The creatinine level was around 1.5 in 06/2018 and 2.32 on admission. Urinalysis has no RBC, no wbc but with nephrotic range proteinuria.  Ultrasound kidneys showed no obstruction however has echogenic kidney.  -Serum creatinine level stable at 2.1.  Plan to continue Lasix 80 mg twice a day, order potassium chloride every day.  Lower extremity edema still present but better than before. -checking secondary work-up of proteinuria as discussed below. -Monitor BMP, urine output.  Avoid nephrotoxins including IV contrast or NSAIDs.  #Nephrotic syndrome likely due to diabetic nephropathy: Patient has 10.5 g of proteinuria associated with chronic hematuria, hypoalbuminemia and lower extremity edema.  This is likely due to uncontrolled diabetic nephropathy. ANA, dsDNA, ANCA, c3, c4, hep B, hep C, kappa lamda ratio unremarkable. -Status post kidney biopsy on 9/3, tolerated well.  We will follow-up the result of the biopsy. -He has elevated LDL level, on  Statin.  #Hypokalemia likely due to diuretics:   Continue potassium chloride on discharge.  Monitor lab.  #Acute respiratory failure with hypoxia: May have heart failure with preserved EF.  VQ scan negative for PE.  Not on oxygen currently.  #Chest pain: Resolved now.  #Hypertension:  Holding ACE inhibitor.   Blood pressure elevated, currently on amlodipine, carvedilol, diuretics and is started hydralazine today by primary team.  Monitor blood pressure.  Renal function is stable and patient is nonoliguric.  The outpatient  follow-up arranged with me on 03/03/2019 at 1:45 PM.  Patient should arrive to [place] [facility] office at 1:15 PM.  I provided the information to the patient and discussed with the primary team.  I will sign off, please call back with question.  Subjective: Seen and examined at bedside.  Reports feeling good.  Good response with diuretics.  Still having some lower extremity edema.  Denies nausea, vomiting, chest pain, shortness breath. Objective Vital signs in last 24 hours: Vitals:   02/18/19 1417 02/18/19 2100 02/19/19 0529 02/19/19 0531  BP: (!) 160/96 (!) 150/95  (!) 169/112  Pulse: 97   93  Resp:  20  18  Temp: 98.2 F (36.8 C) 98.6 F (37 C)  98.1 F (36.7 C)  TempSrc: Oral Oral  Oral  SpO2: 100% 98%  97%  Weight:   67.2 kg   Height:       Weight change: -1.724 kg  Intake/Output Summary (Last 24 hours) at 02/19/2019 1137 Last data filed at 02/19/2019 0800 Gross per 24 hour  Intake 960 ml  Output 600 ml  Net 360 ml       Labs: Basic Metabolic Panel: Recent Labs  Lab 02/17/19 0349 02/18/19 0342 02/19/19 0353  NA 135 135 135  K 3.9 3.6 3.8  CL 103 101 101  CO2 21* 23 25  GLUCOSE 144* 186* 154*  BUN 42* 41* 34*  CREATININE 2.19* 2.30* 2.13*  CALCIUM 8.6* 8.3* 8.3*  PHOS 3.9 4.8* 4.1   Liver Function Tests: Recent Labs  Lab 02/12/19 1154  02/16/19 0357 02/17/19 0349 02/18/19 0342  AST 20  --   --   --   --   ALT  14  --   --   --   --   ALKPHOS 68  --   --   --   --   BILITOT 0.8  --   --   --   --   PROT 6.5  --   --   --   --   ALBUMIN 2.2*   < > 2.3* 2.2* 2.0*   < > = values in this interval not displayed.   Recent Labs  Lab 02/12/19 1154  LIPASE 34   No results for input(s): AMMONIA in the last 168 hours. CBC: Recent Labs  Lab 02/12/19 2238 02/13/19 0309 02/15/19 0502 02/18/19 0342 02/19/19 0806  WBC 16.8* 14.5* 13.7* 11.6* 10.8*  HGB 15.5 12.6* 12.6* 12.3* 13.3  HCT 47.3 37.2* 37.6* 36.1* 40.1  MCV 91.0 88.8 88.1 86.6 88.5  PLT 257 246  266 242 248   Cardiac Enzymes: No results for input(s): CKTOTAL, CKMB, CKMBINDEX, TROPONINI in the last 168 hours. CBG: Recent Labs  Lab 02/18/19 1203 02/18/19 1611 02/18/19 2103 02/19/19 0754 02/19/19 1111  GLUCAP 88 255* 348* 200* 223*    Iron Studies: No results for input(s): IRON, TIBC, TRANSFERRIN, FERRITIN in the last 72 hours. Studies/Results: US Biopsy (kidney)  Result Date: 02/18/2019 INDICATION: Acute renal insufficiency of uncertain etiology. Please perform ultrasound-guided random renal biopsy for tissue diagnostic purposes. EXAM: ULTRASOUND GUIDED RENAL BIOPSY COMPARISON:  Renal ultrasound-02/14/2019 MEDICATIONS: None. ANESTHESIA/SEDATION: Fentanyl 75 mcg IV; Versed 1 mg IV Total Moderate Sedation time: 10 minutes; The patient was continuously monitored during the procedure by the interventional radiology nurse under my direct supervision. COMPLICATIONS: None immediate. PROCEDURE: Informed written consent was obtained from the patient after a discussion of the risks, benefits and alternatives to treatment. The patient understands and consents the procedure. A timeout was performed prior to the initiation of the procedure. Ultrasound scanning was performed of the bilateral flanks. The inferior pole of the left kidney was selected for biopsy due to location and sonographic window. The procedure was planned. The operative site was prepped and draped in the usual sterile fashion. The overlying soft tissues were anesthetized with 1% lidocaine with epinephrine. A 17 gauge core needle biopsy device was advanced into the inferior cortex of the left kidney and 3 core biopsies were obtained under direct ultrasound guidance. Images were saved for documentation purposes. The biopsy device was removed and hemostasis was obtained with manual compression. Post procedural scanning was negative for significant post procedural hemorrhage or additional complication. A dressing was placed. The patient  tolerated the procedure well without immediate post procedural complication. IMPRESSION: Technically successful ultrasound guided left renal biopsy. Electronically Signed   By: Sandi Mariscal M.D.   On: 02/18/2019 11:06    Medications: Infusions:   Scheduled Medications: . amLODipine  10 mg Oral Daily  . atorvastatin  40 mg Oral Daily  . carvedilol  25 mg Oral BID WC  . clotrimazole   Topical BID  . enoxaparin (LOVENOX) injection  40 mg Subcutaneous Q24H  . furosemide  80 mg Oral BID  . gabapentin  300 mg Oral TID  . hydrALAZINE  10 mg Intravenous Once  . hydrALAZINE  10 mg Oral Q8H  . insulin aspart  0-9 Units Subcutaneous TID WC  . insulin glargine  15 Units Subcutaneous Daily  . pantoprazole  80 mg Oral Daily  . [START ON 02/20/2019] pneumococcal 23 valent vaccine  0.5 mL Intramuscular Tomorrow-1000  . polyethylene glycol  17 g Oral Daily  .  potassium chloride  20 mEq Oral Daily  . ramelteon  8 mg Oral QHS  . sodium chloride flush  3 mL Intravenous Once    have reviewed scheduled and prn medications.  Physical Exam: General:NAD, comfortable Heart:RRR, s1s2 nl, no rub Lungs: Clear b/l, no crackle, no wheeze Abdomen:soft, Non-tender, non-distended Extremities: Lower extremity edema+ Neurology: Alert awake and following commands, no asterixis  Christopher Davila 02/19/2019,11:37 AM  LOS: 7 days  Pager: [phone]

## 2019-02-20 LAB — CBC
HCT: 38.1 % — ABNORMAL LOW (ref 39.0–52.0)
Hemoglobin: 12.8 g/dL — ABNORMAL LOW (ref 13.0–17.0)
MCH: 29.3 pg (ref 26.0–34.0)
MCHC: 33.6 g/dL (ref 30.0–36.0)
MCV: 87.2 fL (ref 80.0–100.0)
Platelets: 245 10*3/uL (ref 150–400)
RBC: 4.37 MIL/uL (ref 4.22–5.81)
RDW: 11.8 % (ref 11.5–15.5)
WBC: 10.3 10*3/uL (ref 4.0–10.5)
nRBC: 0 % (ref 0.0–0.2)

## 2019-02-20 LAB — RENAL FUNCTION PANEL
Albumin: 2.4 g/dL — ABNORMAL LOW (ref 3.5–5.0)
Anion gap: 10 (ref 5–15)
BUN: 31 mg/dL — ABNORMAL HIGH (ref 6–20)
CO2: 25 mmol/L (ref 22–32)
Calcium: 8.6 mg/dL — ABNORMAL LOW (ref 8.9–10.3)
Chloride: 99 mmol/L (ref 98–111)
Creatinine, Ser: 2.14 mg/dL — ABNORMAL HIGH (ref 0.61–1.24)
GFR calc Af Amer: 42 mL/min — ABNORMAL LOW (ref >60)
GFR calc non Af Amer: 36 mL/min — ABNORMAL LOW (ref >60)
Glucose, Bld: 173 mg/dL — ABNORMAL HIGH (ref 70–99)
Phosphorus: 4.5 mg/dL (ref 2.5–4.6)
Potassium: 4 mmol/L (ref 3.5–5.1)
Sodium: 134 mmol/L — ABNORMAL LOW (ref 135–145)

## 2019-02-20 LAB — GLUCOSE, CAPILLARY
Glucose-Capillary: 182 mg/dL — ABNORMAL HIGH (ref 70–99)
Glucose-Capillary: 305 mg/dL — ABNORMAL HIGH (ref 70–99)

## 2019-02-20 NOTE — Progress Notes (Signed)
Family Medicine Teaching Service Daily Progress Note Intern Pager: [phone]  Patient name: Christopher Davila record number: [id number] Date of birth: Jul 14, 1970 Age: 48 y.o. Gender: male  Primary Care Provider: Marliss Coots, NP Consultants: None Code Status: FULL  Pt Overview and Major Events to Date:  08/28: Admitted for dyspnea 08/29: cardiology curb-sided and do not believe this is ACS 9/3: Kidney biopsy  Assessment and Plan: Christopher Davila is a 48 y.o. male presenting with dyspnea secondary to AKI with nephrotic syndrome. PMH is significant for T2 DM, HTN, reports fatty liver, HLD, EtOH use.  AKI with nephrotic syndrome Patient's creatinine on admission was 2.33, 2.13 on 9/4. Patient's baseline runs approximately 1.5. UA with >300 protein, glucose and blood with 6-10 RBCs seen on micro. On Lasix 80 mg PO BID. UOP yesterday 2.8L with improved fluid status on exam. LE edema decreasing to 2+ pitting at the mid calf. He also has hypokalemia, likely due to diuretics, K 3.8 yesterday. - continue 80 mg Lasix PO twice daily  -STRICT I/Os -Daily weight - f/u kidney bx -Hold patient's lisinopril and HCTZ in setting of AKI -Renal US no obstruction, but echogenic and FEUrea 45.9% indicating intrinsic etiology -24-hour protein urine 10,560 -Nephrology consulted, appreciate recommendations -Trend BMP / urinary output -Replace electrolytes as indicated  -Avoid nephrotoxic agents (IV contrast, NSAIDs, etc) ensure adequate renal perfusion -outpatient follow-up arranged with Dr. Carolin Sicks on 03/03/2019 at 1:45 PM.  Dyspnea with new O2 requirement- resolved Patient satting 98% on room air. Pitting edema 2+ to mid calf. Patient lost 2.5 kg since admission, weight now 67.5kg. He is not complaining of any dyspnea today, reports he feels well.  -Strict I/Os -Daily weight -Continuous O2 monitoring -Continue albuterol and ipratropium nebulizers every 4 hours PRN - S/p  azithromycin,(8/28-9/1) -S/p prednisone 40 mg daily, (8/28-9/1) -PT/OT signed off  Elevated troponins   Lower extremity edema -resolved Resolved.  -Daily weights, strict I's and O's -Elevate legs  Abdominal pain, resolved Resolved. No pain today. -Tylenol 65m q6h - Protonix 80 mg daily - Monitor with serial abdominal exams - Consider CT scan with worsening.  Diabetes type 2 with neuropathy Hemoglobin A1c is 8.3 home medication includes metformin 500 mg twice daily. Will hold off on increasing metformin until he reaches cr baseline. PCP can do this. CBGs have recently ranged from 168-298.  BG improved s/p steroid burst, which ended on 9/1.  - Monitor CBGs - continue Lantus 15 units - mSSI -lotrimin cream for toe rash on feet  HTN Pressures over the last 24 hours have ranged from 149s-150s over 99s-103s. BP this morning is 149/99. Home medications include Norvasc 10 mg, Zestoretic 20-12.5 daily, metoprolol 50 mg twice daily. Continued diuresis likely to help decrease BP,  will watch for improvement.  Home amlodipine 10 mg daily restarted. -Discontinue metoprolol 50 mg -Start Coreg 25 mg PO BID -continue amlodipine 10 mg daily - Monitor blood pressures, likely to improve with diuresis - Hold HCTZ/lisinopril in setting of AKI  Reported fatty liver Admission labs showed albumin AST-20, ALT-14, lipase-34, alk phos- 68.  Albumin 2.2.   HLD Total chol 244. Triglycerides 182. LDL 136. AST/ALT WNL. Will increase Lioitor to 80n mg daily to maximize protection as he is at high risk of CVD. - Increase Lipitor to 80 mg qd  Alcohol abuse Has a h/o abuse. CIWA scores remain 0. Not withdrawing this hospital admission. -CIWA scores discontinued; no previous evidence of withdrawl  FEN/GI: Carb modified, 2 g sodium diet Prophylaxis:  Lovenox 40 mg daily   Disposition: Home today   Subjective:  States he is doing well this morning and does not have any pain or complaint and that  he is looking forward to going home.  Objective: Temp:  [98.1 F (36.7 C)-98.3 F (36.8 C)] 98.3 F (36.8 C) (09/04 2111) Pulse Rate:  [83-93] 91 (09/04 2111) Resp:  [18-20] 20 (09/04 2111) BP: (146-169)/(95-112) 150/103 (09/04 2111) SpO2:  [97 %-100 %] 99 % (09/04 2111) Weight:  [67.2 kg] 67.2 kg (09/04 0529)   Physical Exam: General: Appears well, no acute distress. Age appropriate. Cardiac: RRR, normal heart sounds, no murmurs Respiratory: CTAB, normal effort Abdomen: soft, nontender, nondistended Extremities: 2+ pitting edema up to calf but improved from admission. No cyanosis. Skin: Warm and dry, no rashes noted Neuro: alert and oriented, no focal deficits Psych: normal affect   Laboratory: Recent Labs  Lab 02/15/19 0502 02/18/19 0342 02/19/19 0806  WBC 13.7* 11.6* 10.8*  HGB 12.6* 12.3* 13.3  HCT 37.6* 36.1* 40.1  PLT 266 242 248   Recent Labs  Lab 02/17/19 0349 02/18/19 0342 02/19/19 0353  NA 135 135 135  K 3.9 3.6 3.8  CL 103 101 101  CO2 21* 23 25  BUN 42* 41* 34*  CREATININE 2.19* 2.30* 2.13*  CALCIUM 8.6* 8.3* 8.3*  GLUCOSE 144* 186* 154*   Lipid panel [[id number]] (Abnormal) Collected: 02/12/19 2238  Specimen: Blood from Hand Updated: 02/12/19 2338   Cholesterol 244High  mg/dL    Triglycerides 182High  mg/dL    HDL 72 mg/dL    Total CHOL/HDL Ratio 3.4 RATIO    VLDL 36 mg/dL    LDL Cholesterol 136High  mg/dL    Hemoglobin A1c [[id number]] (Abnormal) Collected: 02/12/19 2238  Specimen: Blood from Hand Updated: 02/12/19 2314   Hgb A1c MFr Bld 8.3High  %      Imaging/Diagnostic Tests:  US Biopsy (kidney)  Result Date: 02/18/2019 INDICATION: Acute renal insufficiency of uncertain etiology. Please perform ultrasound-guided random renal biopsy for tissue diagnostic purposes. EXAM: ULTRASOUND GUIDED RENAL BIOPSY COMPARISON:  Renal ultrasound-02/14/2019 MEDICATIONS: None. ANESTHESIA/SEDATION: Fentanyl 75 mcg IV; Versed 1 mg IV Total Moderate  Sedation time: 10 minutes; The patient was continuously monitored during the procedure by the interventional radiology nurse under my direct supervision. COMPLICATIONS: None immediate. PROCEDURE: Informed written consent was obtained from the patient after a discussion of the risks, benefits and alternatives to treatment. The patient understands and consents the procedure. A timeout was performed prior to the initiation of the procedure. Ultrasound scanning was performed of the bilateral flanks. The inferior pole of the left kidney was selected for biopsy due to location and sonographic window. The procedure was planned. The operative site was prepped and draped in the usual sterile fashion. The overlying soft tissues were anesthetized with 1% lidocaine with epinephrine. A 17 gauge core needle biopsy device was advanced into the inferior cortex of the left kidney and 3 core biopsies were obtained under direct ultrasound guidance. Images were saved for documentation purposes. The biopsy device was removed and hemostasis was obtained with manual compression. Post procedural scanning was negative for significant post procedural hemorrhage or additional complication. A dressing was placed. The patient tolerated the procedure well without immediate post procedural complication. IMPRESSION: Technically successful ultrasound guided left renal biopsy. Electronically Signed   By: Sandi Mariscal M.D.   On: 02/18/2019 11:06   CHEST - 2 VIEW COMPARISON: PA and lateral chest 06/18/2018. Lungs are clear. There are very small  bilateral pleural effusions. Heart size is normal. No pneumothorax. No acute or focal bony abnormality. IMPRESSION: Small bilateral pleural effusions. Otherwise negative.   PORTABLE CHEST 1 VIEW COMPARISON: Chest radiograph dated 02/12/2019 and CT dated 12/21/2007. IMPRESSION: 1. No focal consolidation. 2. No evidence of bowel obstruction.  PORTABLE CHEST 1 VIEW COMPARISON: Chest  radiograph dated 02/12/2019 and CT dated 12/21/2007. IMPRESSION: 1. No focal consolidation. 2. No evidence of bowel obstruction.  NUCLEAR MEDICINE PERFUSION LUNG SCAN COMPARISON: Chest radiograph 02/12/2019 IMPRESSION: No evidence of pulmonary embolism.  Gerlene Fee, DO 02/20/2019, 7:02 AM PGY-1, [place] Intern pager: [phone], text pages welcome

## 2019-02-20 NOTE — Discharge Instructions (Signed)
Please read and follow all provided instructions.  Your diagnoses today include:  1. Epigastric abdominal pain   2. Acute renal failure superimposed on chronic kidney disease, unspecified CKD stage, unspecified acute renal failure type ([place])   3. Chronic alcohol use    To help reduce your swelling, please take furosemide 80 mg 2 times per day.  Please also take the potassium supplement called K-dur once daily since furosemide can lower your potassium.  To help improve your blood pressure, we have changed your metoprolol to carvedilol.  Please take this medication 2 times per day.  We have also started a new medication called hydralazine, which she will need to take 3 times per day to help lower your blood pressure and prevent a heart attack or stroke in the future.  We have stopped your metformin for now, since this can be hard on your kidneys.  You would benefit from starting insulin once daily.  Please discuss this with your primary doctor, and here she can set you up with this medication.  Please follow-up with nephrology.  They will discuss the results of your kidney biopsy with you.  Medications prescribed:   Carafate - for stomach upset and to protect your stomach   Protonix - stomach acid reducer  This medication can be found over-the-counter  Take any prescribed medications only as directed.  Home care instructions:   Follow any educational materials contained in this packet.  Please stop taking the prednisone as this will make your sugar go too high.   Follow-up instructions: Please follow-up with your primary care provider in the next 3 days for further evaluation of your symptoms.  It is important for you to have your kidney function rechecked.  Return instructions:  SEEK IMMEDIATE MEDICAL ATTENTION IF:  The pain does not go away or becomes severe   A temperature above 101F develops   Repeated vomiting occurs (multiple episodes)   The pain becomes localized to  portions of the abdomen. The right side could possibly be appendicitis. In an adult, the left lower portion of the abdomen could be colitis or diverticulitis.   Blood is being passed in stools or vomit (bright red or black tarry stools)   You develop chest pain, difficulty breathing, dizziness or fainting, or become confused, poorly responsive, or inconsolable (young children)  If you have any other emergent concerns regarding your health  Additional Information: Abdominal (belly) pain can be caused by many things. Your caregiver performed an examination and possibly ordered blood/urine tests and imaging (CT scan, x-rays, ultrasound). Many cases can be observed and treated at home after initial evaluation in the emergency department. Even though you are being discharged home, abdominal pain can be unpredictable. Therefore, you need a repeated exam if your pain does not resolve, returns, or worsens. Most patients with abdominal pain don't have to be admitted to the hospital or have surgery, but serious problems like appendicitis and gallbladder attacks can start out as nonspecific pain. Many abdominal conditions cannot be diagnosed in one visit, so follow-up evaluations are very important.  Your vital signs today were: BP (!) 213/139    Pulse (!) 108    Temp 98.5 F (36.9 C) (Oral)    Resp 20    SpO2 98%  If your blood pressure (bp) was elevated above 135/85 this visit, please have this repeated by your doctor within one month. --------------

## 2019-03-09 ENCOUNTER — Encounter (HOSPITAL_COMMUNITY): Payer: Self-pay | Admitting: Nephrology

## 2019-03-22 ENCOUNTER — Emergency Department (HOSPITAL_COMMUNITY)
Admission: EM | Admit: 2019-03-22 | Discharge: 2019-03-22 | Discharge status: Against Medical Advice | Payer: Self-pay | Attending: Emergency Medicine | Admitting: Emergency Medicine

## 2019-03-22 ENCOUNTER — Other Ambulatory Visit: Payer: Self-pay

## 2019-03-22 ENCOUNTER — Encounter (HOSPITAL_COMMUNITY): Payer: Self-pay | Admitting: *Deleted

## 2019-03-22 DIAGNOSIS — Z5321 Procedure and treatment not carried out due to patient leaving prior to being seen by health care provider: Secondary | ICD-10-CM | POA: Insufficient documentation

## 2019-03-22 LAB — LIPASE, BLOOD: Lipase: 39 U/L (ref 11–51)

## 2019-03-22 LAB — COMPREHENSIVE METABOLIC PANEL WITH GFR
ALT: 19 U/L (ref 0–44)
AST: 23 U/L (ref 15–41)
Albumin: 2.6 g/dL — ABNORMAL LOW (ref 3.5–5.0)
Alkaline Phosphatase: 99 U/L (ref 38–126)
Anion gap: 11 (ref 5–15)
BUN: 22 mg/dL — ABNORMAL HIGH (ref 6–20)
CO2: 22 mmol/L (ref 22–32)
Calcium: 8.6 mg/dL — ABNORMAL LOW (ref 8.9–10.3)
Chloride: 99 mmol/L (ref 98–111)
Creatinine, Ser: 2.36 mg/dL — ABNORMAL HIGH (ref 0.61–1.24)
GFR calc Af Amer: 37 mL/min — ABNORMAL LOW
GFR calc non Af Amer: 32 mL/min — ABNORMAL LOW
Glucose, Bld: 420 mg/dL — ABNORMAL HIGH (ref 70–99)
Potassium: 3.1 mmol/L — ABNORMAL LOW (ref 3.5–5.1)
Sodium: 132 mmol/L — ABNORMAL LOW (ref 135–145)
Total Bilirubin: 0.8 mg/dL (ref 0.3–1.2)
Total Protein: 6.8 g/dL (ref 6.5–8.1)

## 2019-03-22 LAB — CBC
HCT: 33.5 % — ABNORMAL LOW (ref 39.0–52.0)
Hemoglobin: 11.9 g/dL — ABNORMAL LOW (ref 13.0–17.0)
MCH: 29.3 pg (ref 26.0–34.0)
MCHC: 35.5 g/dL (ref 30.0–36.0)
MCV: 82.5 fL (ref 80.0–100.0)
Platelets: 196 10*3/uL (ref 150–400)
RBC: 4.06 MIL/uL — ABNORMAL LOW (ref 4.22–5.81)
RDW: 11.4 % — ABNORMAL LOW (ref 11.5–15.5)
WBC: 9.5 10*3/uL (ref 4.0–10.5)
nRBC: 0 % (ref 0.0–0.2)

## 2019-03-22 MED ORDER — SODIUM CHLORIDE 0.9% FLUSH: 3.0000 mL | Freq: Once | INTRAVENOUS | Status: DC

## 2019-03-22 NOTE — ED Notes (Signed)
Called x3 for room, no answer.

## 2019-03-22 NOTE — ED Triage Notes (Signed)
Pt states RLQ pain x 1 week.  Denies changes in bowel or bladder habits.

## 2019-04-16 ENCOUNTER — Other Ambulatory Visit: Payer: Self-pay

## 2019-04-16 ENCOUNTER — Observation Stay (HOSPITAL_COMMUNITY)
Admission: EM | Admit: 2019-04-16 | Discharge: 2019-04-18 | Disposition: A | Discharge status: Home / Self Care | Payer: Self-pay | Attending: Family Medicine | Admitting: Family Medicine

## 2019-04-16 DIAGNOSIS — R739 Hyperglycemia, unspecified: Secondary | ICD-10-CM

## 2019-04-16 DIAGNOSIS — Z885 Allergy status to narcotic agent status: Secondary | ICD-10-CM | POA: Insufficient documentation

## 2019-04-16 DIAGNOSIS — E11649 Type 2 diabetes mellitus with hypoglycemia without coma: Secondary | ICD-10-CM | POA: Diagnosis present

## 2019-04-16 DIAGNOSIS — E871 Hypo-osmolality and hyponatremia: Secondary | ICD-10-CM | POA: Insufficient documentation

## 2019-04-16 DIAGNOSIS — E785 Hyperlipidemia, unspecified: Secondary | ICD-10-CM | POA: Diagnosis present

## 2019-04-16 DIAGNOSIS — E1122 Type 2 diabetes mellitus with diabetic chronic kidney disease: Secondary | ICD-10-CM | POA: Insufficient documentation

## 2019-04-16 DIAGNOSIS — Z8249 Family history of ischemic heart disease and other diseases of the circulatory system: Secondary | ICD-10-CM | POA: Insufficient documentation

## 2019-04-16 DIAGNOSIS — I130 Hypertensive heart and chronic kidney disease with heart failure and stage 1 through stage 4 chronic kidney disease, or unspecified chronic kidney disease: Secondary | ICD-10-CM | POA: Insufficient documentation

## 2019-04-16 DIAGNOSIS — Z7982 Long term (current) use of aspirin: Secondary | ICD-10-CM | POA: Insufficient documentation

## 2019-04-16 DIAGNOSIS — F1010 Alcohol abuse, uncomplicated: Secondary | ICD-10-CM | POA: Insufficient documentation

## 2019-04-16 DIAGNOSIS — Z79899 Other long term (current) drug therapy: Secondary | ICD-10-CM | POA: Insufficient documentation

## 2019-04-16 DIAGNOSIS — Z789 Other specified health status: Secondary | ICD-10-CM

## 2019-04-16 DIAGNOSIS — Z20828 Contact with and (suspected) exposure to other viral communicable diseases: Secondary | ICD-10-CM | POA: Insufficient documentation

## 2019-04-16 DIAGNOSIS — K219 Gastro-esophageal reflux disease without esophagitis: Secondary | ICD-10-CM | POA: Insufficient documentation

## 2019-04-16 DIAGNOSIS — E8809 Other disorders of plasma-protein metabolism, not elsewhere classified: Secondary | ICD-10-CM | POA: Diagnosis present

## 2019-04-16 DIAGNOSIS — Z7289 Other problems related to lifestyle: Secondary | ICD-10-CM

## 2019-04-16 DIAGNOSIS — IMO0002 Reserved for concepts with insufficient information to code with codable children: Secondary | ICD-10-CM

## 2019-04-16 DIAGNOSIS — Z87891 Personal history of nicotine dependence: Secondary | ICD-10-CM | POA: Insufficient documentation

## 2019-04-16 DIAGNOSIS — N189 Chronic kidney disease, unspecified: Secondary | ICD-10-CM | POA: Diagnosis present

## 2019-04-16 DIAGNOSIS — Z833 Family history of diabetes mellitus: Secondary | ICD-10-CM | POA: Insufficient documentation

## 2019-04-16 DIAGNOSIS — R000 Tachycardia, unspecified: Secondary | ICD-10-CM | POA: Insufficient documentation

## 2019-04-16 DIAGNOSIS — I509 Heart failure, unspecified: Secondary | ICD-10-CM | POA: Insufficient documentation

## 2019-04-16 DIAGNOSIS — I160 Hypertensive urgency: Secondary | ICD-10-CM | POA: Diagnosis present

## 2019-04-16 DIAGNOSIS — E876 Hypokalemia: Secondary | ICD-10-CM | POA: Insufficient documentation

## 2019-04-16 DIAGNOSIS — R0600 Dyspnea, unspecified: Secondary | ICD-10-CM | POA: Insufficient documentation

## 2019-04-16 DIAGNOSIS — D649 Anemia, unspecified: Secondary | ICD-10-CM | POA: Insufficient documentation

## 2019-04-16 DIAGNOSIS — Z794 Long term (current) use of insulin: Secondary | ICD-10-CM | POA: Insufficient documentation

## 2019-04-16 DIAGNOSIS — E1165 Type 2 diabetes mellitus with hyperglycemia: Principal | ICD-10-CM | POA: Insufficient documentation

## 2019-04-16 DIAGNOSIS — R0602 Shortness of breath: Secondary | ICD-10-CM | POA: Diagnosis present

## 2019-04-16 DIAGNOSIS — F1090 Alcohol use, unspecified, uncomplicated: Secondary | ICD-10-CM

## 2019-04-16 DIAGNOSIS — E1143 Type 2 diabetes mellitus with diabetic autonomic (poly)neuropathy: Secondary | ICD-10-CM | POA: Insufficient documentation

## 2019-04-16 DIAGNOSIS — N179 Acute kidney failure, unspecified: Secondary | ICD-10-CM | POA: Diagnosis present

## 2019-04-16 DIAGNOSIS — N183 Chronic kidney disease, stage 3 unspecified: Secondary | ICD-10-CM | POA: Diagnosis present

## 2019-04-16 LAB — COMPREHENSIVE METABOLIC PANEL
ALT: 17 U/L (ref 0–44)
AST: 23 U/L (ref 15–41)
Albumin: 2.7 g/dL — ABNORMAL LOW (ref 3.5–5.0)
Alkaline Phosphatase: 99 U/L (ref 38–126)
Anion gap: 13 (ref 5–15)
BUN: 20 mg/dL (ref 6–20)
CO2: 24 mmol/L (ref 22–32)
Calcium: 8.6 mg/dL — ABNORMAL LOW (ref 8.9–10.3)
Chloride: 90 mmol/L — ABNORMAL LOW (ref 98–111)
Creatinine, Ser: 2.99 mg/dL — ABNORMAL HIGH (ref 0.61–1.24)
GFR calc Af Amer: 28 mL/min — ABNORMAL LOW (ref >60)
GFR calc non Af Amer: 24 mL/min — ABNORMAL LOW (ref >60)
Glucose, Bld: 450 mg/dL — ABNORMAL HIGH (ref 70–99)
Potassium: 2.6 mmol/L — CL (ref 3.5–5.1)
Sodium: 127 mmol/L — ABNORMAL LOW (ref 135–145)
Total Bilirubin: 1.1 mg/dL (ref 0.3–1.2)
Total Protein: 6.7 g/dL (ref 6.5–8.1)

## 2019-04-16 LAB — CBC
HCT: 31.8 % — ABNORMAL LOW (ref 39.0–52.0)
Hemoglobin: 11.2 g/dL — ABNORMAL LOW (ref 13.0–17.0)
MCH: 29 pg (ref 26.0–34.0)
MCHC: 35.2 g/dL (ref 30.0–36.0)
MCV: 82.4 fL (ref 80.0–100.0)
Platelets: 245 10*3/uL (ref 150–400)
RBC: 3.86 MIL/uL — ABNORMAL LOW (ref 4.22–5.81)
RDW: 11.8 % (ref 11.5–15.5)
WBC: 10.5 10*3/uL (ref 4.0–10.5)
nRBC: 0 % (ref 0.0–0.2)

## 2019-04-16 LAB — URINALYSIS, ROUTINE W REFLEX MICROSCOPIC
Bacteria, UA: NONE SEEN
Bilirubin Urine: NEGATIVE
Glucose, UA: >=500 mg/dL — AB
Ketones, ur: NEGATIVE mg/dL
Leukocytes,Ua: NEGATIVE
Nitrite: NEGATIVE
Protein, ur: >=300 mg/dL — AB
Specific Gravity, Urine: 1.022 (ref 1.005–1.030)
pH: 5 (ref 5.0–8.0)

## 2019-04-16 LAB — CBG MONITORING, ED: Glucose-Capillary: 458 mg/dL — ABNORMAL HIGH (ref 70–99)

## 2019-04-16 NOTE — ED Triage Notes (Signed)
Pt to ED , reports weakness, shob, fatigue, nausea over the past week that is getting worse. Pt reports he thinks this is because of a medication change. Hx of DM, HTN

## 2019-04-17 ENCOUNTER — Emergency Department (HOSPITAL_COMMUNITY): Payer: Self-pay

## 2019-04-17 DIAGNOSIS — I160 Hypertensive urgency: Secondary | ICD-10-CM

## 2019-04-17 DIAGNOSIS — E1165 Type 2 diabetes mellitus with hyperglycemia: Secondary | ICD-10-CM

## 2019-04-17 DIAGNOSIS — E8809 Other disorders of plasma-protein metabolism, not elsewhere classified: Secondary | ICD-10-CM | POA: Diagnosis present

## 2019-04-17 DIAGNOSIS — E876 Hypokalemia: Secondary | ICD-10-CM | POA: Diagnosis present

## 2019-04-17 DIAGNOSIS — E785 Hyperlipidemia, unspecified: Secondary | ICD-10-CM

## 2019-04-17 DIAGNOSIS — E118 Type 2 diabetes mellitus with unspecified complications: Secondary | ICD-10-CM

## 2019-04-17 DIAGNOSIS — N183 Chronic kidney disease, stage 3 unspecified: Secondary | ICD-10-CM | POA: Diagnosis present

## 2019-04-17 DIAGNOSIS — N179 Acute kidney failure, unspecified: Secondary | ICD-10-CM | POA: Diagnosis present

## 2019-04-17 DIAGNOSIS — Z7289 Other problems related to lifestyle: Secondary | ICD-10-CM

## 2019-04-17 DIAGNOSIS — D649 Anemia, unspecified: Secondary | ICD-10-CM | POA: Diagnosis present

## 2019-04-17 DIAGNOSIS — R0600 Dyspnea, unspecified: Secondary | ICD-10-CM

## 2019-04-17 DIAGNOSIS — N1832 Chronic kidney disease, stage 3b: Secondary | ICD-10-CM

## 2019-04-17 HISTORY — DX: Other disorders of plasma-protein metabolism, not elsewhere classified: E88.09

## 2019-04-17 HISTORY — DX: Hypertensive urgency: I16.0

## 2019-04-17 LAB — BASIC METABOLIC PANEL
Anion gap: 14 (ref 5–15)
Anion gap: 11 (ref 5–15)
BUN: 21 mg/dL — ABNORMAL HIGH (ref 6–20)
BUN: 22 mg/dL — ABNORMAL HIGH (ref 6–20)
CO2: 23 mmol/L (ref 22–32)
CO2: 22 mmol/L (ref 22–32)
Calcium: 8.3 mg/dL — ABNORMAL LOW (ref 8.9–10.3)
Calcium: 8.2 mg/dL — ABNORMAL LOW (ref 8.9–10.3)
Chloride: 93 mmol/L — ABNORMAL LOW (ref 98–111)
Chloride: 97 mmol/L — ABNORMAL LOW (ref 98–111)
Creatinine, Ser: 2.71 mg/dL — ABNORMAL HIGH (ref 0.61–1.24)
Creatinine, Ser: 2.71 mg/dL — ABNORMAL HIGH (ref 0.61–1.24)
GFR calc Af Amer: 31 mL/min — ABNORMAL LOW (ref >60)
GFR calc Af Amer: 31 mL/min — ABNORMAL LOW (ref >60)
GFR calc non Af Amer: 27 mL/min — ABNORMAL LOW (ref >60)
GFR calc non Af Amer: 27 mL/min — ABNORMAL LOW (ref >60)
Glucose, Bld: 353 mg/dL — ABNORMAL HIGH (ref 70–99)
Glucose, Bld: 296 mg/dL — ABNORMAL HIGH (ref 70–99)
Potassium: 2.7 mmol/L — CL (ref 3.5–5.1)
Potassium: 3.2 mmol/L — ABNORMAL LOW (ref 3.5–5.1)
Sodium: 130 mmol/L — ABNORMAL LOW (ref 135–145)
Sodium: 130 mmol/L — ABNORMAL LOW (ref 135–145)

## 2019-04-17 LAB — CBG MONITORING, ED
Glucose-Capillary: 368 mg/dL — ABNORMAL HIGH (ref 70–99)
Glucose-Capillary: 324 mg/dL — ABNORMAL HIGH (ref 70–99)

## 2019-04-17 LAB — GLUCOSE, CAPILLARY
Glucose-Capillary: 274 mg/dL — ABNORMAL HIGH (ref 70–99)
Glucose-Capillary: 388 mg/dL — ABNORMAL HIGH (ref 70–99)
Glucose-Capillary: 284 mg/dL — ABNORMAL HIGH (ref 70–99)

## 2019-04-17 LAB — MAGNESIUM: Magnesium: 1.5 mg/dL — ABNORMAL LOW (ref 1.7–2.4)

## 2019-04-17 LAB — CREATININE, URINE, RANDOM: Creatinine, Urine: 85.03 mg/dL

## 2019-04-17 LAB — SODIUM, URINE, RANDOM: Sodium, Ur: <10 mmol/L

## 2019-04-17 LAB — SARS CORONAVIRUS 2 (TAT 6-24 HRS): SARS Coronavirus 2: NEGATIVE

## 2019-04-17 MED ORDER — VITAMIN B-1 100 MG PO TABS
100.0000 mg | ORAL_TABLET | Freq: Every day | ORAL | Status: DC
  Administered 2019-04-17 – 2019-04-18 (×2): 100 mg via ORAL
  Filled 2019-04-17 (×2): qty 1

## 2019-04-17 MED ORDER — INSULIN ASPART 100 UNIT/ML ~~LOC~~ SOLN
0.0000 [IU] | Freq: Three times a day (TID) | SUBCUTANEOUS | Status: DC
  Administered 2019-04-17: 8 [IU] via SUBCUTANEOUS
  Administered 2019-04-17: 5 [IU] via SUBCUTANEOUS
  Administered 2019-04-17: 15 [IU] via SUBCUTANEOUS
  Administered 2019-04-18: 5 [IU] via SUBCUTANEOUS
  Administered 2019-04-18: 15 [IU] via SUBCUTANEOUS
  Administered 2019-04-18: 5 [IU] via SUBCUTANEOUS

## 2019-04-17 MED ORDER — HYDRALAZINE HCL 10 MG PO TABS
10.0000 mg | ORAL_TABLET | Freq: Three times a day (TID) | ORAL | Status: DC
  Filled 2019-04-17: qty 1

## 2019-04-17 MED ORDER — SODIUM CHLORIDE 0.9% FLUSH
3.0000 mL | Freq: Two times a day (BID) | INTRAVENOUS | Status: DC
  Administered 2019-04-17: 3 mL via INTRAVENOUS

## 2019-04-17 MED ORDER — POTASSIUM CHLORIDE 10 MEQ/100ML IV SOLN
10.0000 meq | INTRAVENOUS | Status: AC
  Administered 2019-04-17 (×3): 10 meq via INTRAVENOUS
  Filled 2019-04-17 (×3): qty 100

## 2019-04-17 MED ORDER — FOLIC ACID 1 MG PO TABS
1.0000 mg | ORAL_TABLET | Freq: Every day | ORAL | Status: DC
  Administered 2019-04-17 – 2019-04-18 (×2): 1 mg via ORAL
  Filled 2019-04-17 (×2): qty 1

## 2019-04-17 MED ORDER — ADULT MULTIVITAMIN W/MINERALS CH
1.0000 | ORAL_TABLET | Freq: Every day | ORAL | Status: DC
  Administered 2019-04-17 – 2019-04-18 (×2): 1 via ORAL
  Filled 2019-04-17 (×2): qty 1

## 2019-04-17 MED ORDER — INSULIN STARTER KIT- SYRINGES (ENGLISH)
1.0000 | Freq: Once | Status: AC
  Administered 2019-04-17: 1
  Filled 2019-04-17: qty 1

## 2019-04-17 MED ORDER — TRAZODONE HCL 50 MG PO TABS
50.0000 mg | ORAL_TABLET | Freq: Once | ORAL | Status: AC
  Administered 2019-04-17: 50 mg via ORAL
  Filled 2019-04-17: qty 1

## 2019-04-17 MED ORDER — INSULIN GLARGINE 100 UNIT/ML ~~LOC~~ SOLN
15.0000 [IU] | Freq: Every day | SUBCUTANEOUS | Status: DC
  Administered 2019-04-17: 15 [IU] via SUBCUTANEOUS
  Filled 2019-04-17 (×4): qty 0.15

## 2019-04-17 MED ORDER — SODIUM CHLORIDE 0.9 % IV BOLUS
1000.0000 mL | Freq: Once | INTRAVENOUS | Status: AC
  Administered 2019-04-17: 1000 mL via INTRAVENOUS

## 2019-04-17 MED ORDER — SUCRALFATE 1 G PO TABS
1.0000 g | ORAL_TABLET | Freq: Three times a day (TID) | ORAL | Status: DC
  Administered 2019-04-17 – 2019-04-18 (×7): 1 g via ORAL
  Filled 2019-04-17 (×7): qty 1

## 2019-04-17 MED ORDER — GABAPENTIN 300 MG PO CAPS
300.0000 mg | ORAL_CAPSULE | Freq: Once | ORAL | Status: AC
  Administered 2019-04-17: 300 mg via ORAL
  Filled 2019-04-17: qty 1

## 2019-04-17 MED ORDER — LABETALOL HCL 5 MG/ML IV SOLN
10.0000 mg | INTRAVENOUS | Status: DC | PRN
  Filled 2019-04-17 (×2): qty 4

## 2019-04-17 MED ORDER — GABAPENTIN 300 MG PO CAPS
300.0000 mg | ORAL_CAPSULE | Freq: Three times a day (TID) | ORAL | Status: DC
  Administered 2019-04-17 – 2019-04-18 (×4): 300 mg via ORAL
  Filled 2019-04-17 (×4): qty 1

## 2019-04-17 MED ORDER — HYDRALAZINE HCL 50 MG PO TABS
50.0000 mg | ORAL_TABLET | Freq: Three times a day (TID) | ORAL | Status: DC
  Administered 2019-04-17 – 2019-04-18 (×4): 50 mg via ORAL
  Filled 2019-04-17 (×5): qty 1

## 2019-04-17 MED ORDER — ACETAMINOPHEN 325 MG PO TABS: 650.0000 mg | ORAL_TABLET | Freq: Four times a day (QID) | ORAL | Status: DC | PRN

## 2019-04-17 MED ORDER — INSULIN ASPART 100 UNIT/ML IV SOLN
5.0000 [IU] | Freq: Once | INTRAVENOUS | Status: AC
  Administered 2019-04-17: 5 [IU] via INTRAVENOUS

## 2019-04-17 MED ORDER — LIVING WELL WITH DIABETES BOOK
Freq: Once | Status: AC
  Administered 2019-04-17: 18:00:00
  Filled 2019-04-17: qty 1

## 2019-04-17 MED ORDER — LORAZEPAM 2 MG/ML IJ SOLN: 1.0000 mg | INTRAMUSCULAR | Status: DC | PRN

## 2019-04-17 MED ORDER — THIAMINE HCL 100 MG/ML IJ SOLN: 100.0000 mg | Freq: Every day | INTRAMUSCULAR | Status: DC

## 2019-04-17 MED ORDER — HYDRALAZINE HCL 25 MG PO TABS
35.0000 mg | ORAL_TABLET | Freq: Once | ORAL | Status: AC
  Administered 2019-04-17: 35 mg via ORAL
  Filled 2019-04-17 (×2): qty 1

## 2019-04-17 MED ORDER — AMLODIPINE BESYLATE 10 MG PO TABS
10.0000 mg | ORAL_TABLET | Freq: Every day | ORAL | Status: DC
  Administered 2019-04-17 – 2019-04-18 (×2): 10 mg via ORAL
  Filled 2019-04-17: qty 2
  Filled 2019-04-17 (×3): qty 1

## 2019-04-17 MED ORDER — ONDANSETRON HCL 4 MG PO TABS: 4.0000 mg | ORAL_TABLET | Freq: Four times a day (QID) | ORAL | Status: DC | PRN

## 2019-04-17 MED ORDER — CLOTRIMAZOLE 1 % EX CREA
TOPICAL_CREAM | Freq: Two times a day (BID) | CUTANEOUS | Status: DC
  Administered 2019-04-17: 12:00:00 via TOPICAL
  Filled 2019-04-17 (×2): qty 15

## 2019-04-17 MED ORDER — HEPARIN SODIUM (PORCINE) 5000 UNIT/ML IJ SOLN
5000.0000 [IU] | Freq: Three times a day (TID) | INTRAMUSCULAR | Status: DC
  Administered 2019-04-17 – 2019-04-18 (×4): 5000 [IU] via SUBCUTANEOUS
  Filled 2019-04-17 (×4): qty 1

## 2019-04-17 MED ORDER — POTASSIUM CHLORIDE CRYS ER 20 MEQ PO TBCR
40.0000 meq | EXTENDED_RELEASE_TABLET | Freq: Once | ORAL | Status: AC
  Administered 2019-04-17: 40 meq via ORAL
  Filled 2019-04-17: qty 2

## 2019-04-17 MED ORDER — PANTOPRAZOLE SODIUM 40 MG PO TBEC
80.0000 mg | DELAYED_RELEASE_TABLET | Freq: Every day | ORAL | Status: DC
  Administered 2019-04-17 – 2019-04-18 (×2): 80 mg via ORAL
  Filled 2019-04-17 (×2): qty 2

## 2019-04-17 MED ORDER — ASPIRIN EC 81 MG PO TBEC
81.0000 mg | DELAYED_RELEASE_TABLET | Freq: Every day | ORAL | Status: DC
  Administered 2019-04-17 – 2019-04-18 (×2): 81 mg via ORAL
  Filled 2019-04-17 (×3): qty 1

## 2019-04-17 MED ORDER — POTASSIUM CHLORIDE CRYS ER 20 MEQ PO TBCR
20.0000 meq | EXTENDED_RELEASE_TABLET | Freq: Every day | ORAL | Status: DC
  Administered 2019-04-17: 20 meq via ORAL
  Filled 2019-04-17 (×2): qty 1

## 2019-04-17 MED ORDER — CARVEDILOL 25 MG PO TABS
25.0000 mg | ORAL_TABLET | Freq: Two times a day (BID) | ORAL | Status: DC
  Administered 2019-04-17 – 2019-04-18 (×4): 25 mg via ORAL
  Filled 2019-04-17 (×4): qty 1

## 2019-04-17 MED ORDER — SODIUM CHLORIDE 0.9 % IV SOLN: Freq: Once | INTRAVENOUS | Status: DC

## 2019-04-17 MED ORDER — ACETAMINOPHEN 650 MG RE SUPP: 650.0000 mg | Freq: Four times a day (QID) | RECTAL | Status: DC | PRN

## 2019-04-17 MED ORDER — ALBUTEROL SULFATE (2.5 MG/3ML) 0.083% IN NEBU: 2.5000 mg | INHALATION_SOLUTION | Freq: Four times a day (QID) | RESPIRATORY_TRACT | Status: DC | PRN

## 2019-04-17 MED ORDER — LORAZEPAM 1 MG PO TABS: 1.0000 mg | ORAL_TABLET | ORAL | Status: DC | PRN

## 2019-04-17 MED ORDER — ONDANSETRON HCL 4 MG/2ML IJ SOLN: 4.0000 mg | Freq: Four times a day (QID) | INTRAMUSCULAR | Status: DC | PRN

## 2019-04-17 MED ORDER — ATORVASTATIN CALCIUM 80 MG PO TABS
80.0000 mg | ORAL_TABLET | Freq: Every day | ORAL | Status: DC
  Administered 2019-04-17 – 2019-04-18 (×2): 80 mg via ORAL
  Filled 2019-04-17 (×2): qty 1

## 2019-04-17 NOTE — ED Provider Notes (Signed)
[facility] Provider Note   CSN: [id number] Arrival date & time: 04/16/19  1827    History   Chief Complaint Chief Complaint  Patient presents with  . Weakness  . Fatigue  . Shortness of Breath    HPI Christopher Davila is a 48 y.o. male.   The history is provided by the patient.  Weakness Associated symptoms: shortness of breath   Shortness of Breath He has history of hypertension, diabetes and comes in complaining of shortness of breath and fatigue for the last week.  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  He denies fever, chills, sweats.  He has had nausea but no vomiting.  He denies diarrhea.  Denies cough or chest pain.  Symptoms have been worsening.  He denies cough and denies exposure to COVID-19.  Past Medical History:  Diagnosis Date  . Diabetes mellitus   . Gastroparesis due to DM ([place])   . Hypertension   . Neuropathy of lower extremity   . Pancreatitis 06/2012    Patient Active Problem List   Diagnosis Date Noted  . AKI (acute kidney injury) ([place])   . Heart failure ([place]) 02/13/2019  . Epigastric abdominal pain   . Hyperglycemia   . Hypoxia   . Wheezing   . Acute renal failure superimposed on chronic kidney disease ([place])   . Chronic alcohol use   . Gastroparesis due to DM ([place])   . Essential hypertension   . Nausea   . SOB (shortness of breath)   . Hyperlipidemia   . Chest pain 02/14/2014  . Abdominal pain 12/28/2012  . Diabetes ([place]) 06/26/2012  . Hypertension 06/26/2012  . Pancreatitis 06/26/2012  . Weight loss 06/26/2012  . Oral candidiasis 06/26/2012    No past surgical history on file.      Home Medications    Prior to Admission medications   Medication Sig Start Date End Date Taking? Authorizing Provider  amLODipine (NORVASC) 10 MG tablet Take 10 mg by mouth daily.     [provider]  aspirin EC 81 MG tablet Take 1 tablet (81 mg total) by mouth daily. 05/24/15   Ghimire, Henreitta Leber, MD  atorvastatin (LIPITOR) 80 MG tablet Take 1 tablet (80 mg total) by mouth daily. 02/19/19   Gladys Damme, MD  carvedilol (COREG) 25 MG tablet Take 1 tablet (25 mg total) by mouth 2 (two) times daily with a meal. 02/19/19   Gladys Damme, MD  clotrimazole (LOTRIMIN) 1 % cream Apply topically 2 (two) times daily. 02/19/19   Gladys Damme, MD  furosemide (LASIX) 80 MG tablet Take 1 tablet (80 mg total) by mouth 2 (two) times daily. 02/19/19   Gladys Damme, MD  gabapentin (NEURONTIN) 300 MG capsule Take 1 capsule (300 mg total) by mouth 3 (three) times daily. 02/19/19   Gladys Damme, MD  hydrALAZINE (APRESOLINE) 10 MG tablet Take 1 tablet (10 mg total) by mouth 3 (three) times daily. 02/19/19   Gladys Damme, MD  pantoprazole (PROTONIX) 40 MG tablet Take 2 tablets (80 mg total) by mouth daily. 02/20/19   Gladys Damme, MD  potassium chloride SA (K-DUR) 20 MEQ tablet Take 1 tablet (20 mEq total) by mouth daily. 02/20/19   Gladys Damme, MD  sucralfate (CARAFATE) 1 g tablet Take 1 tablet (1 g total) by mouth 4 (four) times daily -  with meals and at bedtime. 02/12/19   Carlisle Cater, PA-C    Family History Family History  Problem Relation Age of Onset  . Diabetes Mother   . Lung disease Mother   . Diabetes Maternal Aunt   . CAD Maternal Aunt   . CAD Cousin     Social History Social History   Tobacco Use  . Smoking status: Former Smoker    Packs/day: 0.00    Types: Cigarettes    Quit date: 03/22/2015    Years since quitting: 4.0  . Smokeless tobacco: Never Used  Substance Use Topics  . Alcohol use: Yes    Comment: on the weekends per patient  . Drug use: No    Comment: per pt* smoking only two cigarettes per day, trying to quit      Allergies   Morphine and related   Review of Systems Review of Systems  Respiratory: Positive for shortness of  breath.   Neurological: Positive for weakness.  All other systems reviewed and are negative.    Physical Exam Updated Vital Signs BP (!) 174/105 (BP Location: Left Arm)   Pulse 98   Temp 98.7 F (37.1 C) (Oral)   Resp 19   Ht 5\' 3"  (1.6 m)   Wt 61.2 kg   SpO2 100%   BMI 23.91 kg/m   Physical Exam Vitals signs and nursing note reviewed.    48 year old male, resting comfortably and in no acute distress. Vital signs are significant for elevated blood pressure. Oxygen saturation is 100%, which is normal. Head is normocephalic and atraumatic. PERRLA, EOMI. Oropharynx is clear. Neck is nontender and supple without adenopathy or JVD. Back is nontender and there is no CVA tenderness. Lungs are clear without rales, wheezes, or rhonchi. Chest is nontender. Heart has regular rate and rhythm without murmur. Abdomen is soft, flat, nontender without masses or hepatosplenomegaly and peristalsis is normoactive. Extremities have no cyanosis or edema, full range of motion is present. Skin is warm and dry without rash. Neurologic: Mental status is normal, cranial nerves are intact, there are no motor or sensory deficits.  ED Treatments / Results  Labs (all labs ordered are listed, but only abnormal results are displayed) Labs Reviewed  CBC - Abnormal; Notable for the following components:      Result Value   RBC 3.86 (*)    Hemoglobin 11.2 (*)    HCT 31.8 (*)    All other components within normal limits  URINALYSIS, ROUTINE W REFLEX MICROSCOPIC - Abnormal; Notable for the following components:   APPearance HAZY (*)    Glucose, UA >=500 (*)    Hgb urine dipstick SMALL (*)    Protein, ur >=300 (*)    All other components within normal limits  COMPREHENSIVE METABOLIC PANEL - Abnormal; Notable for the following components:   Sodium 127 (*)    Potassium 2.6 (*)    Chloride 90 (*)    Glucose, Bld 450 (*)    Creatinine, Ser 2.99 (*)    Calcium 8.6 (*)    Albumin 2.7 (*)    GFR calc  non Af Amer 24 (*)    GFR calc Af Amer 28 (*)    All other components within normal limits  CBG MONITORING, ED - Abnormal; Notable for the following components:   Glucose-Capillary 458 (*)    All other components within normal limits  SARS CORONAVIRUS 2 (TAT 6-24 HRS)  MAGNESIUM  CBG MONITORING, ED    EKG EKG  Interpretation  Date/Time:  Friday April 16 2019 18:46:32 EDT Ventricular Rate:  101 PR Interval:  148 QRS Duration: 84 QT Interval:  372 QTC Calculation: 482 R Axis:   46 Text Interpretation: Sinus tachycardia T wave abnormality, consider inferolateral ischemia Abnormal ECG When compared with ECG of 02/12/2019, No significant change was found Confirmed by Delora Fuel ([id number]) on 04/17/2019 12:21:51 AM   Radiology No results found.  Procedures Procedures  Medications Ordered in ED Medications  potassium chloride 10 mEq in 100 mL IVPB (has no administration in time range)  potassium chloride SA (KLOR-CON) CR tablet 40 mEq (has no administration in time range)  sodium chloride 0.9 % bolus 1,000 mL (has no administration in time range)  gabapentin (NEURONTIN) capsule 300 mg (has no administration in time range)  insulin aspart (novoLOG) injection 5 Units (has no administration in time range)     Initial Impression / Assessment and Plan / ED Course  I have reviewed the triage vital signs and the nursing notes.  Pertinent labs & imaging results that were available during my care of the patient were reviewed by me and considered in my medical decision making (see chart for details).  Fatigue and dyspnea of uncertain cause.  ECG showed T wave abnormality but was unchanged from prior, labs significant for severe hypokalemia as well as hyponatremia and acute kidney injury.  Creatinine is 2.99 compared with baseline at 2.3.  Anemia is present but unchanged from baseline.  Glucose is elevated to 458.  He is given IV fluids and insulin and also given oral and intravenous  potassium.  Because of acute kidney injury, he will be admitted.  Case is discussed with Dr. Myna Hidalgo of [facility], who agrees to admit the patient.  Final Clinical Impressions(s) / ED Diagnoses   Final diagnoses:  Acute kidney injury (nontraumatic) (HCC)  Hypokalemia  Hyperglycemia  Normochromic normocytic anemia  Hyponatremia    ED Discharge Orders    None       Delora Fuel, MD [phone]

## 2019-04-17 NOTE — ED Notes (Signed)
K=2.7 resulted from lab

## 2019-04-17 NOTE — H&P (Signed)
History and Physical    Christopher Davila [id number] DOB: 24-Oct-1970 DOA: 04/16/2019  Referring MD/NP/PA: Mitzi Hansen, MD PCP: Marliss Coots, NP  Patient coming from: Home  Chief Complaint: Shortness of breath, fatigue, and weakness  I have personally briefly reviewed patient's old medical records in [place]   HPI: Christopher Davila is a 48 y.o. male with medical history significant of diabetes mellitus type 2 uncontrolled with neuropathy and nephropathy, uncontrolled hypertension, hyperlipidemia, fatty liver disease, and alcohol use.  He presents with complaints of 1 week of shortness of breath, fatigue, and weakness.  Patient reports that while at work he was unable to even empty the trash without needing to rest due to shortness of breath.  He reports that he has been nauseated with decreased food intake.  Notes even the thought of food has been making him feel sick on his stomach.  Associated symptoms include myalgias, unable to see due to blurry vision in right eye, and urinary frequency.  Denies having any significant vomiting, fever, cough, abdominal pain, diarrhea, leg swelling, or sick contacts.    Last hospitalized back in August for abdominal pain, hyperglycemia, and swelling.  He was treated and diagnosed with diabetic nephropathy with nodular glomerular sclerosis following kidney biopsy.  Nephrology have been consulted and he was set up to follow-up with them in the outpatient setting.  Occasions of Metformin were discontinued and he was supposed to follow-up with the [place] for being started on insulin.  Following that hospitalization he reports that he followed up at the [facility], but due to his work schedule was unable to make the maps program that occurred on Wednesday morning to be started on insulin.  He reports compliance with all the medication changes which were made during the last hospitalization.  Patient also has  been able to cut back on alcohol use, and reports only drinking about 1 beer per day on average now.  Previously, documented to drink 2 beers to a 12 pack/day.   ED Course: Upon admission into the emergency department patient was noted to be afebrile, pulse 90-101, blood pressure 174/105-201/117, and all other vital signs maintained.  Labs significant for WBC 10.5, hemoglobin 11.2, sodium 127, potassium 2.6, BUN 20, creatinine 2.99, glucose 450, anion gap 13, and albumin 2.7.  Urinalysis was positive for glucose >=500 and protein >= 300, but negative for ketones or signs of infection.  Patient was given 30 mEq of potassium chloride IV, potassium chloride 40 mEq p.o., 1 L normal saline IV fluids, 5 units of insulin, and 300mg  gabapentin.  TRH called to admit.  Patient accepted to a medical telemetry bed.  Review of Systems  Constitutional: Positive for malaise/fatigue. Negative for diaphoresis and fever.  HENT: Negative for nosebleeds and sinus pain.   Eyes: Positive for blurred vision. Negative for discharge.  Respiratory: Positive for shortness of breath. Negative for cough and sputum production.   Cardiovascular: Negative for chest pain and leg swelling.  Gastrointestinal: Positive for nausea. Negative for abdominal pain, blood in stool, diarrhea and vomiting.  Genitourinary: Positive for frequency. Negative for dysuria and flank pain.  Musculoskeletal: Positive for myalgias. Negative for falls.  Skin: Negative for rash.  Neurological: Positive for weakness. Negative for focal weakness.  Endo/Heme/Allergies: Does not bruise/bleed easily.  Psychiatric/Behavioral: Negative for memory loss and suicidal ideas.    Past Medical History:  Diagnosis Date  . Diabetes mellitus   . Gastroparesis due to DM ([place])   .  Hypertension   . Neuropathy of lower extremity   . Pancreatitis 06/2012    No past surgical history on file.   reports that he quit smoking about 4 years ago. His smoking use  included cigarettes. He smoked 0.00 packs per day. He has never used smokeless tobacco. He reports current alcohol use. He reports that he does not use drugs.  Allergies  Allergen Reactions  . Morphine And Related Itching and Other (See Comments)    Pt prefers not to be given this drug    Family History  Problem Relation Age of Onset  . Diabetes Mother   . Lung disease Mother   . Diabetes Maternal Aunt   . CAD Maternal Aunt   . CAD Cousin     Prior to Admission medications   Medication Sig Start Date End Date Taking? Authorizing Provider  amLODipine (NORVASC) 10 MG tablet Take 10 mg by mouth daily.   Yes [provider]  aspirin EC 81 MG tablet Take 1 tablet (81 mg total) by mouth daily. 05/24/15  Yes Ghimire, Henreitta Leber, MD  atorvastatin (LIPITOR) 80 MG tablet Take 1 tablet (80 mg total) by mouth daily. 02/19/19  Yes Gladys Damme, MD  carvedilol (COREG) 25 MG tablet Take 1 tablet (25 mg total) by mouth 2 (two) times daily with a meal. 02/19/19  Yes Gladys Damme, MD  clotrimazole (LOTRIMIN) 1 % cream Apply topically 2 (two) times daily. 02/19/19  Yes Gladys Damme, MD  furosemide (LASIX) 80 MG tablet Take 1 tablet (80 mg total) by mouth 2 (two) times daily. 02/19/19  Yes Gladys Damme, MD  gabapentin (NEURONTIN) 300 MG capsule Take 1 capsule (300 mg total) by mouth 3 (three) times daily. 02/19/19  Yes Gladys Damme, MD  hydrALAZINE (APRESOLINE) 10 MG tablet Take 1 tablet (10 mg total) by mouth 3 (three) times daily. 02/19/19  Yes Gladys Damme, MD  pantoprazole (PROTONIX) 40 MG tablet Take 2 tablets (80 mg total) by mouth daily. 02/20/19  Yes Gladys Damme, MD  potassium chloride SA (K-DUR) 20 MEQ tablet Take 1 tablet (20 mEq total) by mouth daily. 02/20/19  Yes Gladys Damme, MD  sucralfate (CARAFATE) 1 g tablet Take 1 tablet (1 g total) by mouth 4 (four) times daily -  with meals and at bedtime. 02/12/19  Yes Carlisle Cater, PA-C    Physical Exam:  Constitutional:  Middle-aged male currently in NAD, calm, comfortable Vitals:   04/17/19 0546 04/17/19 0615 04/17/19 0630 04/17/19 0645  BP: (!) 197/115 (!) 201/117 (!) 187/109 (!) 185/104  Pulse: 90 97 94 93  Resp: 12 19 20 17   Temp: 98.6 F (37 C)     TempSrc: Oral     SpO2: 98% 100% 99% 100%  Weight:      Height:       Eyes: PERRL, lids and conjunctivae normal ENMT: Mucous membranes are moist. Posterior pharynx clear of any exudate or lesions.  Neck: normal, supple, no masses, no thyromegaly Respiratory: clear to auscultation bilaterally, no wheezing, no crackles. Normal respiratory effort. No accessory muscle use.  Cardiovascular: Regular rate and rhythm, no murmurs / rubs / gallops.  Trace bilateral lower extremity edema. 2+ pedal pulses. No carotid bruits.  Abdomen: no tenderness, no masses palpated. No hepatosplenomegaly. Bowel sounds positive.  Musculoskeletal: no clubbing / cyanosis. No joint deformity upper and lower extremities. Good ROM, no contractures. Normal muscle tone.  Skin: no rashes, lesions, ulcers. No induration Neurologic: CN 2-12 grossly intact. Sensation intact, DTR normal. Strength  5/5 in all 4.  Psychiatric: Normal judgment and insight. Alert and oriented x 3. Normal mood.     Labs on Admission: I have personally reviewed following labs and imaging studies  CBC: Recent Labs  Lab 04/16/19 1255  WBC 10.5  HGB 11.2*  HCT 31.8*  MCV 82.4  PLT 99991111   Basic Metabolic Panel: Recent Labs  Lab 04/16/19 1255  NA 127*  K 2.6*  CL 90*  CO2 24  GLUCOSE 450*  BUN 20  CREATININE 2.99*  CALCIUM 8.6*   GFR: Estimated Creatinine Clearance: 24.6 mL/min (A) (by C-G formula based on SCr of 2.99 mg/dL (H)). Liver Function Tests: Recent Labs  Lab 04/16/19 1255  AST 23  ALT 17  ALKPHOS 99  BILITOT 1.1  PROT 6.7  ALBUMIN 2.7*   No results for input(s): LIPASE, AMYLASE in the last 168 hours. No results for input(s): AMMONIA in the last 168 hours. Coagulation Profile:  No results for input(s): INR, PROTIME in the last 168 hours. Cardiac Enzymes: No results for input(s): CKTOTAL, CKMB, CKMBINDEX, TROPONINI in the last 168 hours. BNP (last 3 results) No results for input(s): PROBNP in the last 8760 hours. HbA1C: No results for input(s): HGBA1C in the last 72 hours. CBG: Recent Labs  Lab 04/16/19 1857 04/17/19 0550  GLUCAP 458* 368*   Lipid Profile: No results for input(s): CHOL, HDL, LDLCALC, TRIG, CHOLHDL, LDLDIRECT in the last 72 hours. Thyroid Function Tests: No results for input(s): TSH, T4TOTAL, FREET4, T3FREE, THYROIDAB in the last 72 hours. Anemia Panel: No results for input(s): VITAMINB12, FOLATE, FERRITIN, TIBC, IRON, RETICCTPCT in the last 72 hours. Urine analysis:    Component Value Date/Time   COLORURINE YELLOW 04/16/2019 2104   APPEARANCEUR HAZY (A) 04/16/2019 2104   LABSPEC 1.022 04/16/2019 2104   PHURINE 5.0 04/16/2019 2104   GLUCOSEU >=500 (A) 04/16/2019 2104   HGBUR SMALL (A) 04/16/2019 2104   BILIRUBINUR NEGATIVE 04/16/2019 2104   KETONESUR NEGATIVE 04/16/2019 2104   PROTEINUR >=300 (A) 04/16/2019 2104   UROBILINOGEN 0.2 12/13/2014 2056   NITRITE NEGATIVE 04/16/2019 2104   LEUKOCYTESUR NEGATIVE 04/16/2019 2104   Sepsis Labs: No results found for this or any previous visit (from the past 240 hour(s)).   Radiological Exams on Admission: Dg Chest Port 1 View  Result Date: 04/17/2019 CLINICAL DATA:  Shortness of breath, weakness, fatigue. EXAM: PORTABLE CHEST 1 VIEW COMPARISON:  02/12/2019 FINDINGS: Normal sized heart. Mildly tortuous aorta. Clear lungs with normal vascularity. Unremarkable bones. IMPRESSION: No acute abnormality. Electronically Signed   By: Claudie Revering M.D.   On: 04/17/2019 06:19    EKG: Independently reviewed.  Sinus tachycardia at 101 bpm.  Similar to previous tracings  Assessment/Plan Acute kidney injury superimposed on chronic kidney disease stage III, Diabetic nephropathy: On admission patient's  creatinine noted to be 2.99 with BUN 20.  Creatinine appears to range from around 2.1-2.3 based off patient's recent admissions. During hospitalization in August patient was evaluated and diagnosed with nephrotic syndrome.  Secondary causes including hepatitis B/C, ANA, ANCA, complements, and free light chain were ruled out as a possibility.  Biopsy from 9/3 confirmed diabetic nephropathy.  Patient acute presentation appears to possibly related with dehydration.  He had received 1 L normal saline IV fluids in the ED. patient reports that his follow-up appointment with nephrology is on 5 November.   -Admit to a medical telemetry bed -2 g sodium restricted diet -Check urine sodium, creatinine, and urea -Temporarily holding nephrotoxic agents   Diabetes  mellitus type 2, uncontrolled with complications including diabetic nephropathy, neuropathy, and suspect retinopathy: Patient presents with initial glucose 450 without elevated anion gap.  UA reveals glucose and proteins without ketones.  Patient is not in DKA.  Last hemoglobin A1c on 02/12/2019 was noted to be 8.3.  Patient followed up with the health department, but has not been able to go to the maps program to qualify for insulin due to work schedule.  His complaints of blurry vision if concern for diabetic retinopathy. -Hypoglycemic protocol  -Continue gabapentin -Lantus 15 units daily -CBGs q. before meals with moderate SSI  -Diabetic education consulted -Counseled the patient extensively on the need to follow-up with the maps program to be started on insulin in the outpatient setting -Patient we will need to have formal ophthalmology evaluation in outpatient setting  Hypokalemia: Acute.  Patient's initial potassium 2.6 on admission.  Patient reports having muscle cramps.  Magnesium level pending.  Patient was given a total of 70 mEq of potassium chloride while in the emergency department. -Continue home potassium chloride supplementation dose of 20  mEq daily -Continue to monitor and adjust replacement dose as needed  Hypertensive urgency: On admission patient's blood pressures elevated up to 201/124.  He reports compliance with medications, but reports that even when he is followed up with his PCP blood pressures have been in 200s.  Medications of amlodipine, Coreg 25 mg twice daily, hydralazine 10 mg 3 times daily, and furosemide 80 mg twice daily at home. -Hold furosemide due to AKI -Continue Coreg and amlodipine -Increased hydralazine to 50 mg 3 times daily -Adjust regimen as needed for better blood pressure control.   Dyspnea: Patient maintaining O2 saturations on room air.  Chest x-ray was otherwise noted to be clear.    Records show previous admission in August where D-dimers were elevated and VQ scan is did not show any acute signs of a PE.Given patient's uncontrolled blood pressure question the possibility of pulmonary hypertension, but previous echo on 8/29 does not note any signs of this at that time.. -Continue to monitor  Hyponatremia: Acute.  On admission sodium noted to be 127, but after calculated for hyperglycemia appears to be within normal limits at 135. -Continue to monitor  Normocytic anemia: Chronic.  Hemoglobin 11.2 which appears near patient's baseline.  No reports of bleeding -Continue to monitor  Hypoalbuminemia: Chronic.  Albumin 2.7 which appears slightly improved from previous values earlier this year. -Continue to monitor  Hyperlipidemia -Continue atorvastatin  Alcohol abuse: Patient reports that he has cut back on the amount of alcohol that he drinks daily from up to a 12 pack of beers per day down to just 1 beer per day on average. -CIWA protocol without scheduled Ativan  GERD -Continue Protonix and Carafate  DVT prophylaxis: heparin  Code Status: Full Family Communication: No family present at bedside Disposition Plan: Likely discharge home in 1 to 3 days Consults called: None Admission status:  Observation  Norval Morton MD [facility] Pager [phone]   If 7PM-7AM, please contact night-coverage [email or web address] Password [email or web address]  04/17/2019, 7:10 AM

## 2019-04-17 NOTE — ED Notes (Signed)
Dr. Smith at bedside.

## 2019-04-17 NOTE — Progress Notes (Addendum)
Inpatient Diabetes Program Recommendations  AACE/ADA: New Consensus Statement on Inpatient Glycemic Control   Target Ranges:  Prepandial:   less than 140 mg/dL      Peak postprandial:   less than 180 mg/dL (1-2 hours)      Critically ill patients:  140 - 180 mg/dL   Results for BENGY, LESKO (MRN [id number]) as of 04/17/2019 12:56  Ref. Range 04/16/2019 18:57 04/17/2019 05:50 04/17/2019 07:20 04/17/2019 11:26  Glucose-Capillary Latest Ref Range: 70 - 99 mg/dL 458 (H) 368 (H) 324 (H) 274 (H)  Results for COULTER, GALLER (MRN [id number]) as of 04/17/2019 12:56  Ref. Range 12/21/2007 20:50 06/27/2012 15:52 02/14/2014 17:40 05/24/2015 03:40 02/12/2019 22:38  Hemoglobin A1C Latest Ref Range: 4.8 - 5.6 % 9.4... (H) 7.6 (H) 8.9 (H) 9.3 (H) 8.3 (H)   Review of Glycemic Control  Diabetes history: DM2 Outpatient Diabetes medications: Metformin 500 mg BID (not taken since Aug when discharged from the hospital) Current orders for Inpatient glycemic control: Lantus 15 units QHS, Novolog 0-15 units TID with meals  Inpatient Diabetes Program Recommendations:   Insulin-Correction: Please consider ordering Novolog 0-5 units QHS for bedtime correction.  A1C: Please consider ordering current A1C. Last A1C 8.3% on 02/12/19 indicating an average glucose of 192 mg/dl.   NOTE: Noted consult for Diabetes Coordinator. Diabetes Coordinator is not on campus over the weekend but available by pager from 8am to 5pm for questions or concerns. Chart reviewed. Patient was inpatient from 02/12/19-02/15/19. Per notes, patient was prescribed Prednisone from 02/12/19 to 02/16/19.  Per discharge summary on 02/15/19, "Mr. Sia has a history of DMT2, and has been on lantus and SSI in the hospital, but it is unclear if he has had insulin in the outpatient setting. Due to the holiday weekend and constraints of access to medications while in the hospital (Mr. Pivirotto does not have insurance, and only had a limited amount of funds on his person,  but can get prescriptions at reduced cost through the [facility]), we did not prescribe lantus to him as a discharge medication. We strongly recommend that his PCP follow his glucose closely. His BGs in the hospital were initially >350, but with 15U lantus, he was reasonably well controled in the 100-250 zone."  Patient is now ordered Lantus 15 units QHS and Novolog correction TID with meals.   Spoke with patient over the phone about diabetes and home regimen for diabetes control. Patient reports that when he was discharged from the hospital in August that he was told to stop the Metformin so he has not taken any medications for DM since then. Patient states that he followed up with [facility] and he was told he would be started on insulin but he needed to get into the MAP program so he would be able to get the insulin. Patient states that the MAP program only meets on Wednesday and he has not been able to get there on a Wednesday because he works during the week (works at [employer]).  Inquired about any other programs or clinics in the past and patient states that he use to have the orange card but it is expired and he has not been able to reapply for the orange card because social services is closed.  Patient reports that he has used insulin (vial/syringe) in the past but he does not recall which insulin he was taking.  Patient is agreeable to take insulin again if he can get medication assistance. Discussed  affordable insulins (Novolin NPH, Novolin Regular, Novolin 70/30) which can be purchased at [facility] for $25 per vial or $43 per box of 5 insulin pens.  Patient states that these insulins sound more affordable for him. Patient reports that he has a glucometer and testing supplies at home but his glucometer needs a new battery. Asked patient if he could afford to get new batteries for the glucometer and patient states that he can afford to get new  batteries. Asked patient to be sure to get new batteries and to start checking glucose 3-4 times per day.  Discussed last A1C results in chart (8.3% on 02/12/19).  Discussed glucose and A1C goals. Discussed importance of checking CBGs and maintaining good CBG control to prevent long-term and short-term complications. Explained how hyperglycemia leads to damage within blood vessels which lead to the common complications seen with uncontrolled diabetes. Stressed to the patient the importance of improving glycemic control to prevent further complications from uncontrolled diabetes especially with vision  Issues in right eye and with acute kidney issues.  Discussed impact of nutrition, exercise, stress, sickness, and medications on diabetes control. Informed patient that Living Well with DM book would be ordered and asked that he read the entire book to gain more knowledge on DM and importance of DM control.  Encouraged patient to check glucose 4 times per day and to take insulin as prescribed. Informed patient that CM has been consulted for assistance with medication needs and follow up.  Patient verbalized understanding of information discussed and reports no further questions at this time related to diabetes. Will ask bedside RNs to provide insulin education and allow patient to self inject insulin while inpatient.  Thanks, Barnie Alderman, RN, MSN, CDE Diabetes Coordinator Inpatient Diabetes Program [phone] (Team Pager)

## 2019-04-18 DIAGNOSIS — N179 Acute kidney failure, unspecified: Secondary | ICD-10-CM

## 2019-04-18 DIAGNOSIS — N189 Chronic kidney disease, unspecified: Secondary | ICD-10-CM

## 2019-04-18 LAB — RENAL FUNCTION PANEL
Albumin: 2.4 g/dL — ABNORMAL LOW (ref 3.5–5.0)
Anion gap: 10 (ref 5–15)
BUN: 24 mg/dL — ABNORMAL HIGH (ref 6–20)
CO2: 20 mmol/L — ABNORMAL LOW (ref 22–32)
Calcium: 8.4 mg/dL — ABNORMAL LOW (ref 8.9–10.3)
Chloride: 100 mmol/L (ref 98–111)
Creatinine, Ser: 2.85 mg/dL — ABNORMAL HIGH (ref 0.61–1.24)
GFR calc Af Amer: 29 mL/min — ABNORMAL LOW (ref >60)
GFR calc non Af Amer: 25 mL/min — ABNORMAL LOW (ref >60)
Glucose, Bld: 398 mg/dL — ABNORMAL HIGH (ref 70–99)
Phosphorus: 3.1 mg/dL (ref 2.5–4.6)
Potassium: 3.2 mmol/L — ABNORMAL LOW (ref 3.5–5.1)
Sodium: 130 mmol/L — ABNORMAL LOW (ref 135–145)

## 2019-04-18 LAB — GLUCOSE, CAPILLARY
Glucose-Capillary: 234 mg/dL — ABNORMAL HIGH (ref 70–99)
Glucose-Capillary: 380 mg/dL — ABNORMAL HIGH (ref 70–99)
Glucose-Capillary: 217 mg/dL — ABNORMAL HIGH (ref 70–99)

## 2019-04-18 LAB — MAGNESIUM: Magnesium: 2.1 mg/dL (ref 1.7–2.4)

## 2019-04-18 LAB — CBC
HCT: 28.1 % — ABNORMAL LOW (ref 39.0–52.0)
Hemoglobin: 9.8 g/dL — ABNORMAL LOW (ref 13.0–17.0)
MCH: 28.8 pg (ref 26.0–34.0)
MCHC: 34.9 g/dL (ref 30.0–36.0)
MCV: 82.6 fL (ref 80.0–100.0)
Platelets: 220 10*3/uL (ref 150–400)
RBC: 3.4 MIL/uL — ABNORMAL LOW (ref 4.22–5.81)
RDW: 12 % (ref 11.5–15.5)
WBC: 7.7 10*3/uL (ref 4.0–10.5)
nRBC: 0 % (ref 0.0–0.2)

## 2019-04-18 LAB — UREA NITROGEN, URINE: Urea Nitrogen, Ur: 237 mg/dL

## 2019-04-18 MED ORDER — POTASSIUM CHLORIDE CRYS ER 20 MEQ PO TBCR: 40.0000 meq | EXTENDED_RELEASE_TABLET | Freq: Every day | ORAL | 0 refills | Status: DC

## 2019-04-18 MED ORDER — HYDRALAZINE HCL 25 MG PO TABS: 25.0000 mg | ORAL_TABLET | Freq: Three times a day (TID) | ORAL | 0 refills | Status: DC

## 2019-04-18 MED ORDER — SITAGLIPTIN PHOSPHATE 50 MG PO TABS: 50.0000 mg | ORAL_TABLET | Freq: Every day | ORAL | 0 refills | Status: DC

## 2019-04-18 MED ORDER — GLIPIZIDE ER 5 MG PO TB24: 5.0000 mg | ORAL_TABLET | Freq: Every day | ORAL | 0 refills | Status: DC

## 2019-04-18 MED ORDER — MAGNESIUM SULFATE 2 GM/50ML IV SOLN
2.0000 g | Freq: Once | INTRAVENOUS | Status: AC
  Administered 2019-04-18: 2 g via INTRAVENOUS
  Filled 2019-04-18: qty 50

## 2019-04-18 MED ORDER — POTASSIUM CHLORIDE CRYS ER 20 MEQ PO TBCR
40.0000 meq | EXTENDED_RELEASE_TABLET | ORAL | Status: AC
  Administered 2019-04-18 (×2): 40 meq via ORAL
  Filled 2019-04-18 (×2): qty 2

## 2019-04-18 MED ORDER — FUROSEMIDE 80 MG PO TABS: 80.0000 mg | ORAL_TABLET | Freq: Every day | ORAL | 0 refills | Status: DC

## 2019-04-18 NOTE — Progress Notes (Signed)
NEW ADMISSION NOTE New Admission Note:   Arrival Method: E.D stretcher bed Mental Orientation: Alert and oriented x4 Telemetry: Call and verified Assessment: Completed Skin: Intact [id number] Forearm Pain:Denies Tubes:None Safety Measures: Safety Fall Prevention Plan has been given, discussed and signed Admission: Completed [facility] [place] Orientation: Patient has been orientated to the [place] Admission Note:   Arrival Method:  Mental Orientation:  Telemetry: Assessment: Completed Skin: IV: Pain: Tubes: Safety Measures: Safety Fall Prevention Plan has been given, discussed and signed Admission: Completed [facility] Orientation: Patient has been orientated to the room, unit and staff.  Family:  Orders have been reviewed and implemented. Will continue to monitor the patient. Call light has been placed within reach and bed alarm has been activated.   Velena Keegan, Zenon Mayo, RN m, unit and staff.  Family:  Orders have been reviewed and implemented. Will continue to monitor the patient. Call light has been placed within reach and bed alarm has been activated.   [place], Zenon Mayo, RN

## 2019-04-18 NOTE — Plan of Care (Signed)
See the preceeding nurse note.

## 2019-04-18 NOTE — Progress Notes (Signed)
Insulin starting kit and How to Live With Diabetes Booklet were given to patient.He said that'' I have these booklet at home and I have many insulin syringes at home.R.N. asked him ,if he has a chance to read the Diabetes booklet that was given to him and he answered affirmatively.Nurse proceeded on Diabetes Education to patient,when,whre and how to self administer insulin.Patient was able to point where to inject insulin,was able to tell side effect of insulin and the purpose.Patient has has difficulty of drawing out insulin from the vial and he drew an excess 2 units of what he actually needed,even thought that the patient was wearing his eyeglasses-this problem was discussed  with PMD in front of the patient.Encourage patient to watch Diabetes Video but the patient said and going to watch football game.Encouraged him to read the Diabetes booklet here and at home which going to help him a lot on controlling his diabetes and prevent further complications.

## 2019-04-18 NOTE — TOC Transition Note (Signed)
Transition of Care [facility]) - CM/SW Discharge Note   Patient Details  Name: Christopher Davila MRN: [id number] Date of Birth: 1971/01/08  [facility] of Care [facility]) CM/SW Contact:  Carles Collet, RN Phone Number: 04/18/2019, 9:29 AM   Clinical Narrative:    Spoke w patient at bedside. Provided [place] letter and explained use. He received MATCH in September and we discussed how it is not available again for a year and it is not something he can get everrytime he comes to the hospital. We discussed how he needs to follow up this time w the Health Department or [facility] to get an [employer]. We discussed him getting referral from [facility] or HD to eye doctor at his appointment. Patient agreeable and appreciative.     Final next level of care: Home/Self Care Barriers to Discharge: No Barriers Identified   Patient Goals and CMS Choice        Discharge Placement                       Discharge Plan and Services                                     Social Determinants of Health (SDOH) Interventions     Readmission Risk Interventions Readmission Risk Prevention Plan 02/19/2019  Transportation Screening Complete  PCP or Specialist Appt within 5-7 Days Complete  Home Care Screening Complete  Medication Review (RN CM) Complete  Some recent data might be hidden

## 2019-04-18 NOTE — Progress Notes (Signed)
DISCHARGE NOTE HOME Christopher Davila to be discharged home per MD order. Discussed prescriptions and follow up appointments with the patient. Prescriptions given to patient; medication list explained in detail. Additional Diabetic educations given with his discharged papers such as Adult blood glucose monitoring,Adult DM self care ,Diabetes diet and Diabetes and exercise.Patient verbalized understanding.  Skin clean, dry and intact without evidence of skin break down, no evidence of skin tears noted. IV catheter discontinued intact. Site without signs and symptoms of complications. Dressing and pressure applied. Pt denies pain at the site currently. No complaints noted.  Patient free of lines, drains, and wounds.   An After Visit Summary (AVS) was printed and given to the patient. Patient escorted via wheelchair, and discharged home via private auto.  [place], Christopher Mayo, RN

## 2019-04-18 NOTE — Progress Notes (Signed)
Inpatient Diabetes Program Recommendations  AACE/ADA: New Consensus Statement on Inpatient Glycemic Control   Target Ranges:  Prepandial:   less than 140 mg/dL      Peak postprandial:   less than 180 mg/dL (1-2 hours)      Critically ill patients:  140 - 180 mg/dL   Results for Christopher Davila, Christopher Davila (MRN [id number]) as of 04/18/2019 08:14  Ref. Range 04/17/2019 07:20 04/17/2019 11:26 04/17/2019 16:54 04/17/2019 21:03 04/18/2019 07:23  Glucose-Capillary Latest Ref Range: 70 - 99 mg/dL 324 (H) 274 (H) 388 (H) 284 (H) 234 (H)    Review of Glycemic Control  Diabetes history: DM2 Outpatient Diabetes medications: Metformin 500 mg BID (not taken since Aug when discharged from the hospital) Current orders for Inpatient glycemic control: Lantus 15 units QHS, Novolog 0-15 units TID with meals  Inpatient Diabetes Program Recommendations:   Insulin-Basal: Please consider increasing Lantus to 20 units QHS.  Insulin-Correction: Please consider ordering Novolog 0-5 units QHS for bedtime correction.   Insulin-Meal Coverage: Please consider ordering Novolog 5 units TID with meals for meal coverage if patient eats at least 50% of meals.  Thanks, Barnie Alderman, RN, MSN, CDE Diabetes Coordinator Inpatient Diabetes Program [phone] (Team Pager from 8am to 5pm)

## 2019-04-18 NOTE — Discharge Summary (Signed)
Physician Discharge Summary  Christopher Davila [id number] DOB: 1971/01/25 DOA: 04/16/2019  PCP: Marliss Coots, NP  Admit date: 04/16/2019 Discharge date: 04/18/2019  Admitted From: Home  Disposition: Home  Recommendations for Outpatient Follow-up:  1. Follow up with PCP in 1-2 weeks 2. Please obtain BMP/CBC in one week 3. Please follow up on the following pending results:  Home Health: None Equipment/Devices: None  Discharge Condition: Stable CODE STATUS: Full code Diet recommendation: Low-sodium diet  Subjective: Seen and examined.  Feels better.  No complaints at all.  Brief/Interim Summary: Christopher Davila is a 48 y.o. 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  Patient reports that while at work he was unable to even empty the trash without needing to rest due to shortness of breath.  He reports that he has been nauseated with decreased food intake.  Notes even the thought of food has been making him feel sick on his stomach.  Associated symptoms include myalgias, unable to see due to blurry vision in right eye, and urinary frequency.  Denied having any significant vomiting, fever, cough, abdominal pain, diarrhea, leg swelling, or sick contacts.    Last hospitalized back in August for abdominal pain, hyperglycemia, and swelling.  He was treated and diagnosed with diabetic nephropathy with nodular glomerular sclerosis following kidney biopsy.  Nephrology have been consulted and he was set up to follow-up with them in the outpatient setting. Metformin were discontinued and he was supposed to follow-up with the [facility] for being started on insulin.  Following that hospitalization he reports that he followed up at the [facility], but due to his work schedule was unable to make the maps program that occurred on Wednesday morning to be started on insulin.  He reports compliance with all the medication changes which were made during the last hospitalization.  Patient also has been able to cut back on alcohol use, and reports only drinking about 1 beer per day on average now.  Previously, documented to drink 2 beers to a 12 pack/day.  Upon admission into the emergency department patient was noted to be afebrile, pulse 90-101, blood pressure 174/105-201/117, and all other vital signs maintained.  Labs significant for WBC 10.5, hemoglobin 11.2, sodium 127, potassium 2.6, BUN 20, creatinine 2.99, glucose 450, anion gap 13, and albumin 2.7.  Urinalysis was positive for glucose >=500 and protein >= 300, but negative for ketones or signs of infection.  Patient was given 30 mEq of potassium chloride IV, potassium chloride 40 mEq p.o., 1 L normal saline IV fluids, 5 units of insulin, and 300mg  gabapentin.  He was admitted to hospital service for hyperglycemia and severe hypokalemia.  When seen this morning by me, he feels much better.  He has no other complaint.  No shortness of breath or any weakness.  He was started on Lantus 15 units and SSI.  His blood sugar is much better than yesterday, in low 200s range.  His recent hemoglobin A1c was 8.3 done on 02/12/2019.  He does not have any discomfort insulin and he is working with [facility] health department to get insulin however I personally think that his diabetes can be controlled with just oral hypoglycemic agents.  He also came in with progressively worsening CKD.  His lab creatinine on  03/22/2019 was 2.36 and he came in with creatinine of 2.99 which has been stable now at 2.85.  Has good urine output.  Due to his progressively worsening CKD, he is not a candidate for Metformin.  I am going to discharge him on Januvia 50 mg and glipizide 5 mg p.o. daily.  He already  has glucometer and lancets and needles to check his blood sugar.  I strongly advised him to check his blood sugar at least 2 times a day if not 4 times a day.  He verbalized understanding.  His hypokalemia is likely secondary to high-dose Lasix that he takes 80 mg twice daily.  In lieu of hypokalemia and worsening CKD, I am decreasing his Lasix to 80 mg p.o. daily.  He was taking potassium chloride 20 mg daily and I am increasing that to 40 mg daily as well.  He needs to follow-up with [facility] within a week with repeat BMP.  He also came in with hypertensive urgency.  His blood pressure is much better now.  Since I am decreasing his Lasix so I have increased his hydralazine to 25 mg 3 times daily which he was previously taking 10 mg 3 times daily.  I am resuming his other medications which were amlodipine 10 mg and carvedilol 25 mg twice daily.  His repeat potassium today is 3.2.  This was drawn before he received 40 mEq this morning.  He is going to receive another 40 mEq at noon today before discharge which should help bring his potassium to within normal limits.  Discharge Diagnoses:  Principal Problem:   Acute kidney injury superimposed on chronic kidney disease ([place]) Active Problems:   Diabetes mellitus type 2 with complications, uncontrolled ([place])   Dyspnea   Hyperlipidemia   Alcohol use   Hypertensive urgency   Hypokalemia   Hypoalbuminemia   Normocytic anemia    Discharge Instructions  Discharge Instructions    Discharge patient   Complete by: As directed    Discharge disposition: 01-Home or Self Care   Discharge patient date: 04/18/2019     Allergies as of 04/18/2019      Reactions   Morphine And Related Itching, Other (See Comments)   Pt prefers not to be given this drug      Medication List    TAKE these medications   amLODipine 10 MG tablet Commonly known as: NORVASC Take 10 mg by mouth daily.   aspirin EC 81 MG tablet Take 1 tablet (81 mg total) by  mouth daily.   atorvastatin 80 MG tablet Commonly known as: Lipitor Take 1 tablet (80 mg total) by mouth daily.   carvedilol 25 MG tablet Commonly known as: COREG Take 1 tablet (25 mg total) by mouth 2 (two) times daily with a meal.   clotrimazole 1 % cream Commonly known as: LOTRIMIN Apply topically 2 (two) times daily.   furosemide 80 MG tablet Commonly known as: LASIX Take 1 tablet (80 mg total) by mouth daily. What changed: when to take this   gabapentin 300 MG capsule Commonly known as: NEURONTIN Take 1 capsule (300 mg total) by mouth 3 (three) times daily.   glipiZIDE 5 MG 24 hr tablet Commonly known as: GLUCOTROL XL Take 1 tablet (5 mg total) by mouth daily with breakfast.   hydrALAZINE 25 MG tablet Commonly known as: APRESOLINE Take 1 tablet (25 mg total) by mouth 3 (three) times daily. What changed:   medication strength  how much to take  pantoprazole 40 MG tablet Commonly known as: PROTONIX Take 2 tablets (80 mg total) by mouth daily.   potassium chloride SA 20 MEQ tablet Commonly known as: KLOR-CON Take 2 tablets (40 mEq total) by mouth daily. What changed: how much to take   sitaGLIPtin 50 MG tablet Commonly known as: Januvia Take 1 tablet (50 mg total) by mouth daily.   sucralfate 1 g tablet Commonly known as: Carafate Take 1 tablet (1 g total) by mouth 4 (four) times daily -  with meals and at bedtime.      Follow-up [facility] Follow up.   Why: Call Monday 11/2 after 9am to schedule an Emergency Department follow up visit and to establish care.  Contact information: [place] [phone] [place], [facility] Department Of Public Follow up.   Specialty: Home Health Services Why: Make appointment to follow up about [facility] information: [address] [place] [address] [phone]          Allergies   Allergen Reactions  . Morphine And Related Itching and Other (See Comments)    Pt prefers not to be given this drug    Consultations: None   Procedures/Studies: Dg Chest Port 1 View  Result Date: 04/17/2019 CLINICAL DATA:  Shortness of breath, weakness, fatigue. EXAM: PORTABLE CHEST 1 VIEW COMPARISON:  02/12/2019 FINDINGS: Normal sized heart. Mildly tortuous aorta. Clear lungs with normal vascularity. Unremarkable bones. IMPRESSION: No acute abnormality. Electronically Signed   By: Claudie Revering M.D.   On: 04/17/2019 06:19     Discharge Exam: Vitals:   04/18/19 0459 04/18/19 0905  BP: 139/88 (!) 148/95  Pulse: 99 94  Resp: 16 18  Temp: 99.5 F (37.5 C) 98.4 F (36.9 C)  SpO2: 99% 100%   Vitals:   04/17/19 1653 04/17/19 1955 04/18/19 0459 04/18/19 0905  BP: (!) 152/98 (!) 153/90 139/88 (!) 148/95  Pulse: 94 (!) 104 99 94  Resp: 18 18 16 18   Temp: 98.5 F (36.9 C) 98.8 F (37.1 C) 99.5 F (37.5 C) 98.4 F (36.9 C)  TempSrc: Oral Oral Oral Oral  SpO2: 100% 100% 99% 100%  Weight:      Height:        General: Pt is alert, awake, not in acute distress Cardiovascular: RRR, S1/S2 +, no rubs, no gallops Respiratory: CTA bilaterally, no wheezing, no rhonchi Abdominal: Soft, NT, ND, bowel sounds + Extremities: no edema, no cyanosis    The results of significant diagnostics from this hospitalization (including imaging, microbiology, ancillary and laboratory) are listed below for reference.     Microbiology: Recent Results (from the past 240 hour(s))  SARS CORONAVIRUS 2 (TAT 6-24 HRS) Nasopharyngeal Nasopharyngeal Swab     Status: None   Collection Time: 04/17/19  7:10 AM   Specimen: Nasopharyngeal Swab  Result Value Ref Range Status   SARS Coronavirus 2 NEGATIVE NEGATIVE Final    Comment: (NOTE) SARS-CoV-2 target nucleic acids are NOT DETECTED. The SARS-CoV-2 RNA is generally detectable in upper and lower respiratory specimens during the acute phase of infection.  Negative results do not preclude SARS-CoV-2 infection, do not rule out co-infections with other pathogens, and should not be used as the sole basis for treatment or other patient management decisions. Negative results must be combined with clinical observations, patient history, and epidemiological information. The expected result is Negative. Fact Sheet for Patients: SugarRoll.be Fact Sheet  for Healthcare Providers: https://www.woods-mathews.com/ This test is not yet approved or cleared by the [place] and  has been authorized for detection and/or diagnosis of SARS-CoV-2 by FDA under an Emergency Use Authorization (EUA). This EUA will remain  in effect (meaning this test can be used) for the duration of the COVID-19 declaration under Section 56 4(b)(1) of the Act, 21 U.S.C. section 360bbb-3(b)(1), unless the authorization is terminated or revoked sooner. Performed at [facility], [place] [address] [place], [place] [address]      Labs: BNP (last 3 results) Recent Labs    02/12/19 2238  BNP 99991111*   Basic Metabolic Panel: Recent Labs  Lab 04/16/19 1255 04/17/19 0651 04/17/19 1136 04/18/19 0933  NA 127* 130* 130* 130*  K 2.6* 2.7* 3.2* 3.2*  CL 90* 93* 97* 100  CO2 24 23 22  20*  GLUCOSE 450* 353* 296* 398*  BUN 20 21* 22* 24*  CREATININE 2.99* 2.71* 2.71* 2.85*  CALCIUM 8.6* 8.3* 8.2* 8.4*  MG  --  1.5*  --  2.1  PHOS  --   --   --  3.1   Liver Function Tests: Recent Labs  Lab 04/16/19 1255 04/18/19 0933  AST 23  --   ALT 17  --   ALKPHOS 99  --   BILITOT 1.1  --   PROT 6.7  --   ALBUMIN 2.7* 2.4*   No results for input(s): LIPASE, AMYLASE in the last 168 hours. No results for input(s): AMMONIA in the last 168 hours. CBC: Recent Labs  Lab 04/16/19 1255 04/18/19 0933  WBC 10.5 7.7  HGB 11.2* 9.8*  HCT 31.8* 28.1*  MCV 82.4 82.6  PLT 245 220   Cardiac Enzymes: No results for input(s): CKTOTAL,  CKMB, CKMBINDEX, TROPONINI in the last 168 hours. BNP: Invalid input(s): POCBNP CBG: Recent Labs  Lab 04/17/19 1126 04/17/19 1654 04/17/19 2103 04/18/19 0723 04/18/19 1141  GLUCAP 274* 388* 284* 234* 380*   D-Dimer No results for input(s): DDIMER in the last 72 hours. Hgb A1c No results for input(s): HGBA1C in the last 72 hours. Lipid Profile No results for input(s): CHOL, HDL, LDLCALC, TRIG, CHOLHDL, LDLDIRECT in the last 72 hours. Thyroid function studies No results for input(s): TSH, T4TOTAL, T3FREE, THYROIDAB in the last 72 hours.  Invalid input(s): FREET3 Anemia work up No results for input(s): VITAMINB12, FOLATE, FERRITIN, TIBC, IRON, RETICCTPCT in the last 72 hours. Urinalysis    Component Value Date/Time   COLORURINE YELLOW 04/16/2019 2104   APPEARANCEUR HAZY (A) 04/16/2019 2104   LABSPEC 1.022 04/16/2019 2104   PHURINE 5.0 04/16/2019 2104   GLUCOSEU >=500 (A) 04/16/2019 2104   HGBUR SMALL (A) 04/16/2019 2104   BILIRUBINUR NEGATIVE 04/16/2019 2104   KETONESUR NEGATIVE 04/16/2019 2104   PROTEINUR >=300 (A) 04/16/2019 2104   UROBILINOGEN 0.2 12/13/2014 2056   NITRITE NEGATIVE 04/16/2019 2104   LEUKOCYTESUR NEGATIVE 04/16/2019 2104   Sepsis Labs Invalid input(s): PROCALCITONIN,  WBC,  LACTICIDVEN Microbiology Recent Results (from the past 240 hour(s))  SARS CORONAVIRUS 2 (TAT 6-24 HRS) Nasopharyngeal Nasopharyngeal Swab     Status: None   Collection Time: 04/17/19  7:10 AM   Specimen: Nasopharyngeal Swab  Result Value Ref Range Status   SARS Coronavirus 2 NEGATIVE NEGATIVE Final    Comment: (NOTE) SARS-CoV-2 target nucleic acids are NOT DETECTED. The SARS-CoV-2 RNA is generally detectable in upper and lower respiratory specimens during the acute phase of infection. Negative results do not preclude SARS-CoV-2 infection,  do not rule out co-infections with other pathogens, and should not be used as the sole basis for treatment or other patient management  decisions. Negative results must be combined with clinical observations, patient history, and epidemiological information. The expected result is Negative. Fact Sheet for Patients: SugarRoll.be Fact Sheet for Healthcare Providers: https://www.woods-mathews.com/ This test is not yet approved or cleared by the [place] FDA and  has been authorized for detection and/or diagnosis of SARS-CoV-2 by FDA under an Emergency Use Authorization (EUA). This EUA will remain  in effect (meaning this test can be used) for the duration of the COVID-19 declaration under Section 56 4(b)(1) of the Act, 21 U.S.C. section 360bbb-3(b)(1), unless the authorization is terminated or revoked sooner. Performed at [facility], [place] [address] [place], [place] [address]      Time coordinating discharge: Over 30 minutes  SIGNED:   Darliss Cheney, MD  [facility] 04/18/2019, 11:46 AM  If 7PM-7AM, please contact night-coverage [email or web address] Password [email or web address]

## 2019-04-18 NOTE — Discharge Instructions (Signed)
Blood Glucose Monitoring, Adult Monitoring your blood sugar (glucose) is an important part of managing your diabetes (diabetes mellitus). Blood glucose monitoring involves checking your blood glucose as often as directed and keeping a record (log) of your results over time. Checking your blood glucose regularly and keeping a blood glucose log can:  Help you and your health care provider adjust your diabetes management plan as needed, including your medicines or insulin.  Help you understand how food, exercise, illnesses, and medicines affect your blood glucose.  Let you know what your blood glucose is at any time. You can quickly find out if you have low blood glucose (hypoglycemia) or high blood glucose (hyperglycemia). Your health care provider will set individualized treatment goals for you. Your goals will be based on your age, other medical conditions you have, and how you respond to diabetes treatment. Generally, the goal of treatment is to maintain the following blood glucose levels:  Before meals (preprandial): 80-130 mg/dL (4.4-7.2 mmol/L).  After meals (postprandial): below 180 mg/dL (10 mmol/L).  A1c level: less than 7%. Supplies needed:  Blood glucose meter.  Test strips for your meter. Each meter has its own strips. You must use the strips that came with your meter.  A needle to prick your finger (lancet). Do not use a lancet more than one time.  A device that holds the lancet (lancing device).  A journal or log book to write down your results. How to check your blood glucose  1. Wash your hands with soap and water. 2. Prick the side of your finger (not the tip) with the lancet. Use a different finger each time. 3. Gently rub the finger until a small drop of blood appears. 4. Follow instructions that come with your meter for inserting the test strip, applying blood to the strip, and using your blood glucose meter. 5. Write down your result and any notes. Some meters  allow you to use areas of your body other than your finger (alternative sites) to test your blood. The most common alternative sites are:  Forearm.  Thigh.  Palm of the hand. If you think you may have hypoglycemia, or if you have a history of not knowing when your blood glucose is getting low (hypoglycemia unawareness), do not use alternative sites. Use your finger instead. Alternative sites may not be as accurate as the fingers, because blood flow is slower in these areas. This means that the result you get may be delayed, and it may be different from the result that you would get from your finger. Follow these instructions at home: Blood glucose log   Every time you check your blood glucose, write down your result. Also write down any notes about things that may be affecting your blood glucose, such as your diet and exercise for the day. This information can help you and your health care provider: ? Look for patterns in your blood glucose over time. ? Adjust your diabetes management plan as needed.  Check if your meter allows you to download your records to a computer. Most glucose meters store a record of glucose readings in the meter. If you have type 1 diabetes:  Check your blood glucose 2 or more times a day.  Also check your blood glucose: ? Before every insulin injection. ? Before and after exercise. ? Before meals. ? 2 hours after a meal. ? Occasionally between 2:00 a.m. and 3:00 a.m., as directed. ? Before potentially dangerous tasks, like driving or using heavy  machinery. ? At bedtime.  You may need to check your blood glucose more often, up to 6-10 times a day, if you: ? Use an insulin pump. ? Need multiple daily injections (MDI). ? Have diabetes that is not well-controlled. ? Are ill. ? Have a history of severe hypoglycemia. ? Have hypoglycemia unawareness. If you have type 2 diabetes:  If you take insulin or other diabetes medicines, check your blood glucose 2 or  more times a day.  If you are on intensive insulin therapy, check your blood glucose 4 or more times a day. Occasionally, you may also need to check between 2:00 a.m. and 3:00 a.m., as directed.  Also check your blood glucose: ? Before and after exercise. ? Before potentially dangerous tasks, like driving or using heavy machinery.  You may need to check your blood glucose more often if: ? Your medicine is being adjusted. ? Your diabetes is not well-controlled. ? You are ill. General tips  Always keep your supplies with you.  If you have questions or need help, all blood glucose meters have a 24-hour "hotline" phone number that you can call. You may also contact your health care provider.  After you use a few boxes of test strips, adjust (calibrate) your blood glucose meter by following instructions that came with your meter. Contact a health care provider if:  Your blood glucose is at or above 240 mg/dL (13.3 mmol/L) for 2 days in a row.  You have been sick or have had a fever for 2 days or longer, and you are not getting better.  You have any of the following problems for more than 6 hours: ? You cannot eat or drink. ? You have nausea or vomiting. ? You have diarrhea. Get help right away if:  Your blood glucose is lower than 54 mg/dL (3 mmol/L).  You become confused or you have trouble thinking clearly.  You have difficulty breathing.  You have moderate or large ketone levels in your urine. Summary  Monitoring your blood sugar (glucose) is an important part of managing your diabetes (diabetes mellitus).  Blood glucose monitoring involves checking your blood glucose as often as directed and keeping a record (log) of your results over time.  Your health care provider will set individualized treatment goals for you. Your goals will be based on your age, other medical conditions you have, and how you respond to diabetes treatment.  Every time you check your blood glucose,  write down your result. Also write down any notes about things that may be affecting your blood glucose, such as your diet and exercise for the day. This information is not intended to replace advice given to you by your health care provider. Make sure you discuss any questions you have with your health care provider. Document Released: 06/06/2003 Document Revised: 03/27/2018 Document Reviewed: 11/13/2015 Elsevier Patient Education  2020 [place].   Blood Glucose Monitoring, Adult Monitoring your blood sugar (glucose) is an important part of managing your diabetes (diabetes mellitus). Blood glucose monitoring involves checking your blood glucose as often as directed and keeping a record (log) of your results over time. Checking your blood glucose regularly and keeping a blood glucose log can:  Help you and your health care provider adjust your diabetes management plan as needed, including your medicines or insulin.  Help you understand how food, exercise, illnesses, and medicines affect your blood glucose.  Let you know what your blood glucose is at any time. You can quickly  find out if you have low blood glucose (hypoglycemia) or high blood glucose (hyperglycemia). Your health care provider will set individualized treatment goals for you. Your goals will be based on your age, other medical conditions you have, and how you respond to diabetes treatment. Generally, the goal of treatment is to maintain the following blood glucose levels:  Before meals (preprandial): 80-130 mg/dL (4.4-7.2 mmol/L).  After meals (postprandial): below 180 mg/dL (10 mmol/L).  A1c level: less than 7%. Supplies needed:  Blood glucose meter.  Test strips for your meter. Each meter has its own strips. You must use the strips that came with your meter.  A needle to prick your finger (lancet). Do not use a lancet more than one time.  A device that holds the lancet (lancing device).  A journal or log book to  write down your results. How to check your blood glucose  6. Wash your hands with soap and water. 7. Prick the side of your finger (not the tip) with the lancet. Use a different finger each time. 8. Gently rub the finger until a small drop of blood appears. 9. Follow instructions that come with your meter for inserting the test strip, applying blood to the strip, and using your blood glucose meter. 10. Write down your result and any notes. Some meters allow you to use areas of your body other than your finger (alternative sites) to test your blood. The most common alternative sites are:  Forearm.  Thigh.  Palm of the hand. If you think you may have hypoglycemia, or if you have a history of not knowing when your blood glucose is getting low (hypoglycemia unawareness), do not use alternative sites. Use your finger instead. Alternative sites may not be as accurate as the fingers, because blood flow is slower in these areas. This means that the result you get may be delayed, and it may be different from the result that you would get from your finger. Follow these instructions at home: Blood glucose log   Every time you check your blood glucose, write down your result. Also write down any notes about things that may be affecting your blood glucose, such as your diet and exercise for the day. This information can help you and your health care provider: ? Look for patterns in your blood glucose over time. ? Adjust your diabetes management plan as needed.  Check if your meter allows you to download your records to a computer. Most glucose meters store a record of glucose readings in the meter. If you have type 1 diabetes:  Check your blood glucose 2 or more times a day.  Also check your blood glucose: ? Before every insulin injection. ? Before and after exercise. ? Before meals. ? 2 hours after a meal. ? Occasionally between 2:00 a.m. and 3:00 a.m., as directed. ? Before potentially  dangerous tasks, like driving or using heavy machinery. ? At bedtime.  You may need to check your blood glucose more often, up to 6-10 times a day, if you: ? Use an insulin pump. ? Need multiple daily injections (MDI). ? Have diabetes that is not well-controlled. ? Are ill. ? Have a history of severe hypoglycemia. ? Have hypoglycemia unawareness. If you have type 2 diabetes:  If you take insulin or other diabetes medicines, check your blood glucose 2 or more times a day.  If you are on intensive insulin therapy, check your blood glucose 4 or more times a day. Occasionally, you may also  need to check between 2:00 a.m. and 3:00 a.m., as directed.  Also check your blood glucose: ? Before and after exercise. ? Before potentially dangerous tasks, like driving or using heavy machinery.  You may need to check your blood glucose more often if: ? Your medicine is being adjusted. ? Your diabetes is not well-controlled. ? You are ill. General tips  Always keep your supplies with you.  If you have questions or need help, all blood glucose meters have a 24-hour "hotline" phone number that you can call. You may also contact your health care provider.  After you use a few boxes of test strips, adjust (calibrate) your blood glucose meter by following instructions that came with your meter. Contact a health care provider if:  Your blood glucose is at or above 240 mg/dL (13.3 mmol/L) for 2 days in a row.  You have been sick or have had a fever for 2 days or longer, and you are not getting better.  You have any of the following problems for more than 6 hours: ? You cannot eat or drink. ? You have nausea or vomiting. ? You have diarrhea. Get help right away if:  Your blood glucose is lower than 54 mg/dL (3 mmol/L).  You become confused or you have trouble thinking clearly.  You have difficulty breathing.  You have moderate or large ketone levels in your urine. Summary  Monitoring your  blood sugar (glucose) is an important part of managing your diabetes (diabetes mellitus).  Blood glucose monitoring involves checking your blood glucose as often as directed and keeping a record (log) of your results over time.  Your health care provider will set individualized treatment goals for you. Your goals will be based on your age, other medical conditions you have, and how you respond to diabetes treatment.  Every time you check your blood glucose, write down your result. Also write down any notes about things that may be affecting your blood glucose, such as your diet and exercise for the day. This information is not intended to replace advice given to you by your health care provider. Make sure you discuss any questions you have with your health care provider. Document Released: 06/06/2003 Document Revised: 03/27/2018 Document Reviewed: 11/13/2015 Elsevier Patient Education  [place].   Type 2 Diabetes Mellitus, Self Care, Adult Caring for yourself after you have been diagnosed with type 2 diabetes (type 2 diabetes mellitus) means keeping your blood sugar (glucose) under control with a balance of:  Nutrition.  Exercise.  Lifestyle changes.  Medicines or insulin, if necessary.  Support from your team of health care providers and others. The following information explains what you need to know to manage your diabetes at home. What are the risks? Having diabetes can put you at risk for other long-term (chronic) conditions, such as heart disease and kidney disease. Your health care provider may prescribe medicines to help prevent complications from diabetes. These medicines may include:  Aspirin.  Medicine to lower cholesterol.  Medicine to control blood pressure. How to monitor blood glucose   Check your blood glucose every day, as often as told by your health care provider.  Have your A1c (hemoglobin A1c) level checked two or more times a year, or as often as  told by your health care provider. Your health care provider will set individualized treatment goals for you. Generally, the goal of treatment is to maintain the following blood glucose levels:  Before meals (preprandial): 80-130 mg/dL (4.4-7.2  mmol/L).  After meals (postprandial): below 180 mg/dL (10 mmol/L).  A1c level: less than 7%. How to manage hyperglycemia and hypoglycemia Hyperglycemia symptoms Hyperglycemia, also called high blood glucose, occurs when blood glucose is too high. Make sure you know the early signs of hyperglycemia, such as:  Increased thirst.  Hunger.  Feeling very tired.  Needing to urinate more often than usual.  Blurry vision. Hypoglycemia symptoms Hypoglycemia, also called low blood glucose, occurswith a blood glucose level at or below 70 mg/dL (3.9 mmol/L). The risk for hypoglycemia increases during or after exercise, during sleep, during illness, and when skipping meals or not eating for a long time (fasting). It is important to know the symptoms of hypoglycemia and treat it right away. Always have a 15-gram rapid-acting carbohydrate snack with you to treat low blood glucose. Family members and close friends should also know the symptoms and should understand how to treat hypoglycemia, in case you are not able to treat yourself. Symptoms may include:  Hunger.  Anxiety.  Sweating and feeling clammy.  Confusion.  Dizziness or feeling light-headed.  Sleepiness.  Nausea.  Increased heart rate.  Headache.  Blurry vision.  Irritability.  A change in coordination.  Tingling or numbness around the mouth, lips, or tongue.  Restless sleep.  Fainting.  Seizure. Treating hypoglycemia If you are alert and able to swallow safely, follow the 15:15 rule:  Take 15 grams of a rapid-acting carbohydrate. Talk with your health care provider about how much you should take.  Rapid-acting options include: ? Glucose pills (take 15 grams). ? 6-8  pieces of hard candy. ? 4-6 oz (120-150 mL) of fruit juice. ? 4-6 oz (120-150 mL) of regular (not diet) soda. ? 1 Tbsp (15 mL) honey or sugar.  Check your blood glucose 15 minutes after you take the carbohydrate.  If the repeat blood glucose level is still at or below 70 mg/dL (3.9 mmol/L), take 15 grams of a carbohydrate again.  If your blood glucose level does not increase above 70 mg/dL (3.9 mmol/L) after 3 tries, seek emergency medical care.  After your blood glucose level returns to normal, eat a meal or a snack within 1 hour. Treating severe hypoglycemia Severe hypoglycemia is when your blood glucose level is at or below 54 mg/dL (3 mmol/L). Severe hypoglycemia is an emergency. Do not wait to see if the symptoms will go away. Get medical help right away. Call your local emergency services (911 in the [place] If you have severe hypoglycemia and you cannot eat or drink, you may need an injection of glucagon. A family member or close friend should learn how to check your blood glucose and how to give you a glucagon injection. Ask your health care provider if you need to have an emergency glucagon injection kit available. Severe hypoglycemia may need to be treated in a hospital. The treatment may include getting glucose through an IV. You may also need treatment for the cause of your hypoglycemia. Follow these instructions at home: Take diabetes medicines as told  If your health care provider prescribed insulin or diabetes medicines, take them every day.  Do not run out of insulin or other diabetes medicines that you take. Plan ahead so you always have these available.  If you use insulin, adjust your dosage based on how physically active you are and what foods you eat. Your health care provider will tell you how to adjust your dosage. Make healthy food choices  The things that you eat and  drink affect your blood glucose and your insulin dosage. Making good choices helps to control your  diabetes and prevent other health problems. A healthy meal plan includes eating lean proteins, complex carbohydrates, fresh fruits and vegetables, low-fat dairy products, and healthy fats. Make an appointment to see a diet and nutrition specialist (registered dietitian) to help you create an eating plan that is right for you. Make sure that you:  Follow instructions from your health care provider about eating or drinking restrictions.  Drink enough fluid to keep your urine pale yellow.  Keep a record of the carbohydrates that you eat. Do this by reading food labels and learning the standard serving sizes of foods.  Follow your sick day plan whenever you cannot eat or drink as usual. Make this plan in advance with your health care provider.  Stay active Exercise regularly, as told by your health care provider. This may include:  Stretching and doing strength exercises, such as yoga or weightlifting, 2 or more times a week.  Doing 150 minutes or more of moderate-intensity or vigorous-intensity exercise each week. This could be brisk walking, biking, or water aerobics. ? Spread out your activity over 3 or more days of the week. ? Do not go more than 2 days in a row without doing some kind of physical activity. When you start a new exercise or activity, work with your health care provider to adjust your insulin, medicines, or food intake as needed. Make healthy lifestyle choices  Do not use any tobacco products, such as cigarettes, chewing tobacco, and e-cigarettes. If you need help quitting, ask your health care provider.  If your health care provider says that alcohol is safe for you, limit alcohol intake to no more than 1 drink per day for nonpregnant women and 2 drinks per day for men. One drink equals 12 oz of beer (355 mL), 5 oz of wine (148 mL), or 1 oz of hard liquor (44 mL).  Learn to manage stress. If you need help with this, ask your health care provider. Care for your  body   Keep your immunizations up to date. In addition to getting vaccinations as told by your health care provider, it is recommended that you get vaccinated against the following illnesses: ? The flu (influenza). Get a flu shot every year. ? Pneumonia. ? Hepatitis B.  Schedule an eye exam soon after your diagnosis, and then one time every year after that.  Check your skin and feet every day for cuts, bruises, redness, blisters, or sores. Schedule a foot exam with your health care provider once every year.  Brush your teeth and gums two times a day, and floss one or more times a day. Visit your dentist one or more times every 6 months.  Maintain a healthy weight. General instructions  Take over-the-counter and prescription medicines only as told by your health care provider.  Share your diabetes management plan with people in your workplace, school, and household.  Carry a medical alert card or wear medical alert jewelry.  Keep all follow-up visits as told by your health care provider. This is important. Questions to ask your health care provider  Do I need to meet with a diabetes educator?  Where can I find a support group for people with diabetes? Where to find more information For more information about diabetes, visit:  American Diabetes Association (ADA): www.diabetes.org  American Association of Diabetes Educators (AADE): www.diabeteseducator.org Summary  Caring for yourself after you have been diagnosed  with (type 2 diabetes mellitus) means keeping your blood sugar (glucose) under control with a balance of nutrition, exercise, lifestyle changes, and medicine.  Check your blood glucose every day, as often as told by your health care provider.  Having diabetes can put you at risk for other long-term (chronic) conditions, such as heart disease and kidney disease. Your health care provider may prescribe medicines to help prevent complications from diabetes.  Keep all  follow-up visits as told by your health care provider. This is important. This information is not intended to replace advice given to you by your health care provider. Make sure you discuss any questions you have with your health care provider. Document Released: 09/25/2015 Document Revised: 11/24/2017 Document Reviewed: 07/07/2015 Elsevier Patient Education  [place].  Diabetes Mellitus and Nutrition, Adult When you have diabetes (diabetes mellitus), it is very important to have healthy eating habits because your blood sugar (glucose) levels are greatly affected by what you eat and drink. Eating healthy foods in the appropriate amounts, at about the same times every day, can help you:  Control your blood glucose.  Lower your risk of heart disease.  Improve your blood pressure.  Reach or maintain a healthy weight. Every person with diabetes is different, and each person has different needs for a meal plan. Your health care provider may recommend that you work with a diet and nutrition specialist (dietitian) to make a meal plan that is best for you. Your meal plan may vary depending on factors such as:  The calories you need.  The medicines you take.  Your weight.  Your blood glucose, blood pressure, and cholesterol levels.  Your activity level.  Other health conditions you have, such as heart or kidney disease. How do carbohydrates affect me? Carbohydrates, also called carbs, affect your blood glucose level more than any other type of food. Eating carbs naturally raises the amount of glucose in your blood. Carb counting is a method for keeping track of how many carbs you eat. Counting carbs is important to keep your blood glucose at a healthy level, especially if you use insulin or take certain oral diabetes medicines. It is important to know how many carbs you can safely have in each meal. This is different for every person. Your dietitian can help you calculate how many  carbs you should have at each meal and for each snack. Foods that contain carbs include:  Bread, cereal, rice, pasta, and crackers.  Potatoes and corn.  Peas, beans, and lentils.  Milk and yogurt.  Fruit and juice.  Desserts, such as cakes, cookies, ice cream, and candy. How does alcohol affect me? Alcohol can cause a sudden decrease in blood glucose (hypoglycemia), especially if you use insulin or take certain oral diabetes medicines. Hypoglycemia can be a life-threatening condition. Symptoms of hypoglycemia (sleepiness, dizziness, and confusion) are similar to symptoms of having too much alcohol. If your health care provider says that alcohol is safe for you, follow these guidelines:  Limit alcohol intake to no more than 1 drink per day for nonpregnant women and 2 drinks per day for men. One drink equals 12 oz of beer, 5 oz of wine, or 1 oz of hard liquor.  Do not drink on an empty stomach.  Keep yourself hydrated with water, diet soda, or unsweetened iced tea.  Keep in mind that regular soda, juice, and other mixers may contain a lot of sugar and must be counted as carbs. What are tips for following  this plan?  Reading food labels  Start by checking the serving size on the "Nutrition Facts" label of packaged foods and drinks. The amount of calories, carbs, fats, and other nutrients listed on the label is based on one serving of the item. Many items contain more than one serving per package.  Check the total grams (g) of carbs in one serving. You can calculate the number of servings of carbs in one serving by dividing the total carbs by 15. For example, if a food has 30 g of total carbs, it would be equal to 2 servings of carbs.  Check the number of grams (g) of saturated and trans fats in one serving. Choose foods that have low or no amount of these fats.  Check the number of milligrams (mg) of salt (sodium) in one serving. Most people should limit total sodium intake to less  than 2,300 mg per day.  Always check the nutrition information of foods labeled as "low-fat" or "nonfat". These foods may be higher in added sugar or refined carbs and should be avoided.  Talk to your dietitian to identify your daily goals for nutrients listed on the label. Shopping  Avoid buying canned, premade, or processed foods. These foods tend to be high in fat, sodium, and added sugar.  Shop around the outside edge of the grocery store. This includes fresh fruits and vegetables, bulk grains, fresh meats, and fresh dairy. Cooking  Use low-heat cooking methods, such as baking, instead of high-heat cooking methods like deep frying.  Cook using healthy oils, such as olive, canola, or sunflower oil.  Avoid cooking with butter, cream, or high-fat meats. Meal planning  Eat meals and snacks regularly, preferably at the same times every day. Avoid going long periods of time without eating.  Eat foods high in fiber, such as fresh fruits, vegetables, beans, and whole grains. Talk to your dietitian about how many servings of carbs you can eat at each meal.  Eat 4-6 ounces (oz) of lean protein each day, such as lean meat, chicken, fish, eggs, or tofu. One oz of lean protein is equal to: ? 1 oz of meat, chicken, or fish. ? 1 egg. ?  cup of tofu.  Eat some foods each day that contain healthy fats, such as avocado, nuts, seeds, and fish. Lifestyle  Check your blood glucose regularly.  Exercise regularly as told by your health care provider. This may include: ? 150 minutes of moderate-intensity or vigorous-intensity exercise each week. This could be brisk walking, biking, or water aerobics. ? Stretching and doing strength exercises, such as yoga or weightlifting, at least 2 times a week.  Take medicines as told by your health care provider.  Do not use any products that contain nicotine or tobacco, such as cigarettes and e-cigarettes. If you need help quitting, ask your health care  provider.  Work with a Social worker or diabetes educator to identify strategies to manage stress and any emotional and social challenges. Questions to ask a health care provider  Do I need to meet with a diabetes educator?  Do I need to meet with a dietitian?  What number can I call if I have questions?  When are the best times to check my blood glucose? Where to find more information:  American Diabetes Association: diabetes.org  Academy of Nutrition and Dietetics: www.eatright.[employer] of Diabetes and Digestive and Kidney Diseases (NIH): [employer] Summary  A healthy meal plan will help you control your blood glucose and  maintain a healthy lifestyle.  Working with a diet and nutrition specialist (dietitian) can help you make a meal plan that is best for you.  Keep in mind that carbohydrates (carbs) and alcohol have immediate effects on your blood glucose levels. It is important to count carbs and to use alcohol carefully. This information is not intended to replace advice given to you by your health care provider. Make sure you discuss any questions you have with your health care provider. Document Released: 02/28/2005 Document Revised: 05/16/2017 Document Reviewed: 07/08/2016 Elsevier Patient Education  2020 [employer]. Diabetes Mellitus and Exercise Exercising regularly is important for your overall health, especially when you have diabetes (diabetes mellitus). Exercising is not only about losing weight. It has many other health benefits, such as increasing muscle strength and bone density and reducing body fat and stress. This leads to improved fitness, flexibility, and endurance, all of which result in better overall health. Exercise has additional benefits for people with diabetes, including:  Reducing appetite.  Helping to lower and control blood glucose.  Lowering blood pressure.  Helping to control amounts of fatty substances (lipids) in the  blood, such as cholesterol and triglycerides.  Helping the body to respond better to insulin (improving insulin sensitivity).  Reducing how much insulin the body needs.  Decreasing the risk for heart disease by: ? Lowering cholesterol and triglyceride levels. ? Increasing the levels of good cholesterol. ? Lowering blood glucose levels. What is my activity plan? Your health care provider or certified diabetes educator can help you make a plan for the type and frequency of exercise (activity plan) that works for you. Make sure that you:  Do at least 150 minutes of moderate-intensity or vigorous-intensity exercise each week. This could be brisk walking, biking, or water aerobics. ? Do stretching and strength exercises, such as yoga or weightlifting, at least 2 times a week. ? Spread out your activity over at least 3 days of the week.  Get some form of physical activity every day. ? Do not go more than 2 days in a row without some kind of physical activity. ? Avoid being inactive for more than 30 minutes at a time. Take frequent breaks to walk or stretch.  Choose a type of exercise or activity that you enjoy, and set realistic goals.  Start slowly, and gradually increase the intensity of your exercise over time. What do I need to know about managing my diabetes?   Check your blood glucose before and after exercising. ? If your blood glucose is 240 mg/dL (13.3 mmol/L) or higher before you exercise, check your urine for ketones. If you have ketones in your urine, do not exercise until your blood glucose returns to normal. ? If your blood glucose is 100 mg/dL (5.6 mmol/L) or lower, eat a snack containing 15-20 grams of carbohydrate. Check your blood glucose 15 minutes after the snack to make sure that your level is above 100 mg/dL (5.6 mmol/L) before you start your exercise.  Know the symptoms of low blood glucose (hypoglycemia) and how to treat it. Your risk for hypoglycemia increases during  and after exercise. Common symptoms of hypoglycemia can include: ? Hunger. ? Anxiety. ? Sweating and feeling clammy. ? Confusion. ? Dizziness or feeling light-headed. ? Increased heart rate or palpitations. ? Blurry vision. ? Tingling or numbness around the mouth, lips, or tongue. ? Tremors or shakes. ? Irritability.  Keep a rapid-acting carbohydrate snack available before, during, and after exercise to help prevent or treat  hypoglycemia.  Avoid injecting insulin into areas of the body that are going to be exercised. For example, avoid injecting insulin into: ? The arms, when playing tennis. ? The legs, when jogging.  Keep records of your exercise habits. Doing this can help you and your health care provider adjust your diabetes management plan as needed. Write down: ? Food that you eat before and after you exercise. ? Blood glucose levels before and after you exercise. ? The type and amount of exercise you have done. ? When your insulin is expected to peak, if you use insulin. Avoid exercising at times when your insulin is peaking.  When you start a new exercise or activity, work with your health care provider to make sure the activity is safe for you, and to adjust your insulin, medicines, or food intake as needed.  Drink plenty of water while you exercise to prevent dehydration or heat stroke. Drink enough fluid to keep your urine clear or pale yellow. Summary  Exercising regularly is important for your overall health, especially when you have diabetes (diabetes mellitus).  Exercising has many health benefits, such as increasing muscle strength and bone density and reducing body fat and stress.  Your health care provider or certified diabetes educator can help you make a plan for the type and frequency of exercise (activity plan) that works for you.  When you start a new exercise or activity, work with your health care provider to make sure the activity is safe for you, and to  adjust your insulin, medicines, or food intake as needed. This information is not intended to replace advice given to you by your health care provider. Make sure you discuss any questions you have with your health care provider. Document Released: 08/24/2003 Document Revised: 12/26/2016 Document Reviewed: 11/13/2015 Elsevier Patient Education  2020 [employer].

## 2019-06-08 ENCOUNTER — Emergency Department (HOSPITAL_COMMUNITY)
Admission: EM | Admit: 2019-06-08 | Discharge: 2019-06-08 | Disposition: A | Discharge status: Home / Self Care | Payer: Self-pay | Attending: Emergency Medicine | Admitting: Emergency Medicine

## 2019-06-08 ENCOUNTER — Encounter (HOSPITAL_COMMUNITY): Payer: Self-pay | Admitting: Emergency Medicine

## 2019-06-08 ENCOUNTER — Other Ambulatory Visit: Payer: Self-pay

## 2019-06-08 DIAGNOSIS — H538 Other visual disturbances: Secondary | ICD-10-CM | POA: Insufficient documentation

## 2019-06-08 DIAGNOSIS — Z5321 Procedure and treatment not carried out due to patient leaving prior to being seen by health care provider: Secondary | ICD-10-CM | POA: Insufficient documentation

## 2019-06-08 NOTE — ED Triage Notes (Signed)
Per EMS, pt complains of blurred vision for the past month. Pt states his vision has been gradually getting worse. Pt called his PCP, who told him to go to ED if his vision becomes worse, which it has.   BP 148 palpated HR 100 CBG 159 SpO2 100% on RA

## 2019-06-08 NOTE — ED Notes (Signed)
Per pt, states he has not taking his BP meds today-

## 2019-06-15 ENCOUNTER — Encounter (HOSPITAL_COMMUNITY): Payer: Self-pay | Admitting: Emergency Medicine

## 2019-06-15 ENCOUNTER — Observation Stay (HOSPITAL_COMMUNITY)
Admission: EM | Admit: 2019-06-15 | Discharge: 2019-06-17 | Disposition: A | Discharge status: Home / Self Care | Payer: Self-pay | Attending: Internal Medicine | Admitting: Internal Medicine

## 2019-06-15 ENCOUNTER — Emergency Department (HOSPITAL_COMMUNITY): Payer: Self-pay

## 2019-06-15 DIAGNOSIS — Z79899 Other long term (current) drug therapy: Secondary | ICD-10-CM | POA: Insufficient documentation

## 2019-06-15 DIAGNOSIS — D649 Anemia, unspecified: Secondary | ICD-10-CM | POA: Diagnosis present

## 2019-06-15 DIAGNOSIS — E11649 Type 2 diabetes mellitus with hypoglycemia without coma: Secondary | ICD-10-CM | POA: Diagnosis present

## 2019-06-15 DIAGNOSIS — I160 Hypertensive urgency: Secondary | ICD-10-CM | POA: Diagnosis present

## 2019-06-15 DIAGNOSIS — I509 Heart failure, unspecified: Secondary | ICD-10-CM

## 2019-06-15 DIAGNOSIS — Z7982 Long term (current) use of aspirin: Secondary | ICD-10-CM | POA: Insufficient documentation

## 2019-06-15 DIAGNOSIS — Z20828 Contact with and (suspected) exposure to other viral communicable diseases: Secondary | ICD-10-CM | POA: Insufficient documentation

## 2019-06-15 DIAGNOSIS — Z7984 Long term (current) use of oral hypoglycemic drugs: Secondary | ICD-10-CM | POA: Insufficient documentation

## 2019-06-15 DIAGNOSIS — H53131 Sudden visual loss, right eye: Principal | ICD-10-CM | POA: Diagnosis present

## 2019-06-15 DIAGNOSIS — I161 Hypertensive emergency: Secondary | ICD-10-CM

## 2019-06-15 DIAGNOSIS — I10 Essential (primary) hypertension: Secondary | ICD-10-CM | POA: Diagnosis present

## 2019-06-15 DIAGNOSIS — Z87891 Personal history of nicotine dependence: Secondary | ICD-10-CM | POA: Insufficient documentation

## 2019-06-15 DIAGNOSIS — E119 Type 2 diabetes mellitus without complications: Secondary | ICD-10-CM | POA: Insufficient documentation

## 2019-06-15 DIAGNOSIS — E876 Hypokalemia: Secondary | ICD-10-CM | POA: Diagnosis present

## 2019-06-15 DIAGNOSIS — H539 Unspecified visual disturbance: Secondary | ICD-10-CM

## 2019-06-15 HISTORY — DX: Acute kidney failure, unspecified: N17.9

## 2019-06-15 HISTORY — DX: Hypoxemia: R09.02

## 2019-06-15 HISTORY — DX: Acute kidney failure, unspecified: N18.9

## 2019-06-15 HISTORY — DX: Wheezing: R06.2

## 2019-06-15 HISTORY — DX: Epigastric pain: R10.13

## 2019-06-15 HISTORY — DX: Nausea: R11.0

## 2019-06-15 HISTORY — DX: Dyspnea, unspecified: R06.00

## 2019-06-15 LAB — BASIC METABOLIC PANEL
Anion gap: 12 (ref 5–15)
BUN: 24 mg/dL — ABNORMAL HIGH (ref 6–20)
CO2: 21 mmol/L — ABNORMAL LOW (ref 22–32)
Calcium: 8.5 mg/dL — ABNORMAL LOW (ref 8.9–10.3)
Chloride: 102 mmol/L (ref 98–111)
Creatinine, Ser: 3.07 mg/dL — ABNORMAL HIGH (ref 0.61–1.24)
GFR calc Af Amer: 27 mL/min — ABNORMAL LOW (ref >60)
GFR calc non Af Amer: 23 mL/min — ABNORMAL LOW (ref >60)
Glucose, Bld: 264 mg/dL — ABNORMAL HIGH (ref 70–99)
Potassium: 3.1 mmol/L — ABNORMAL LOW (ref 3.5–5.1)
Sodium: 135 mmol/L (ref 135–145)

## 2019-06-15 LAB — CBC
HCT: 32.2 % — ABNORMAL LOW (ref 39.0–52.0)
Hemoglobin: 10.8 g/dL — ABNORMAL LOW (ref 13.0–17.0)
MCH: 29.4 pg (ref 26.0–34.0)
MCHC: 33.5 g/dL (ref 30.0–36.0)
MCV: 87.7 fL (ref 80.0–100.0)
Platelets: 325 10*3/uL (ref 150–400)
RBC: 3.67 MIL/uL — ABNORMAL LOW (ref 4.22–5.81)
RDW: 12.8 % (ref 11.5–15.5)
WBC: 8.6 10*3/uL (ref 4.0–10.5)
nRBC: 0 % (ref 0.0–0.2)

## 2019-06-15 LAB — TROPONIN I (HIGH SENSITIVITY)
Troponin I (High Sensitivity): 16 ng/L (ref <18)
Troponin I (High Sensitivity): 19 ng/L — ABNORMAL HIGH (ref <18)

## 2019-06-15 MED ORDER — SODIUM CHLORIDE 0.9% FLUSH: 3.0000 mL | Freq: Once | INTRAVENOUS | Status: DC

## 2019-06-15 NOTE — ED Triage Notes (Signed)
Pt here today with c/o htn and loss of vision in his right eye , pt was [facility] 1 week ago for blurred vision , pt state sthat he takes his b/p meds it just doesn't work

## 2019-06-16 ENCOUNTER — Other Ambulatory Visit: Payer: Self-pay

## 2019-06-16 ENCOUNTER — Encounter (HOSPITAL_COMMUNITY): Payer: Self-pay | Admitting: Internal Medicine

## 2019-06-16 ENCOUNTER — Observation Stay (HOSPITAL_COMMUNITY): Payer: Self-pay

## 2019-06-16 DIAGNOSIS — Z7984 Long term (current) use of oral hypoglycemic drugs: Secondary | ICD-10-CM

## 2019-06-16 DIAGNOSIS — E785 Hyperlipidemia, unspecified: Secondary | ICD-10-CM

## 2019-06-16 DIAGNOSIS — E876 Hypokalemia: Secondary | ICD-10-CM

## 2019-06-16 DIAGNOSIS — Z7982 Long term (current) use of aspirin: Secondary | ICD-10-CM

## 2019-06-16 DIAGNOSIS — N189 Chronic kidney disease, unspecified: Secondary | ICD-10-CM

## 2019-06-16 DIAGNOSIS — Z79899 Other long term (current) drug therapy: Secondary | ICD-10-CM

## 2019-06-16 DIAGNOSIS — H5461 Unqualified visual loss, right eye, normal vision left eye: Secondary | ICD-10-CM

## 2019-06-16 DIAGNOSIS — E1122 Type 2 diabetes mellitus with diabetic chronic kidney disease: Secondary | ICD-10-CM

## 2019-06-16 DIAGNOSIS — Z87891 Personal history of nicotine dependence: Secondary | ICD-10-CM

## 2019-06-16 DIAGNOSIS — I5030 Unspecified diastolic (congestive) heart failure: Secondary | ICD-10-CM

## 2019-06-16 DIAGNOSIS — I160 Hypertensive urgency: Secondary | ICD-10-CM

## 2019-06-16 DIAGNOSIS — I130 Hypertensive heart and chronic kidney disease with heart failure and stage 1 through stage 4 chronic kidney disease, or unspecified chronic kidney disease: Secondary | ICD-10-CM

## 2019-06-16 DIAGNOSIS — Z885 Allergy status to narcotic agent status: Secondary | ICD-10-CM

## 2019-06-16 DIAGNOSIS — H53131 Sudden visual loss, right eye: Secondary | ICD-10-CM | POA: Diagnosis present

## 2019-06-16 LAB — URINALYSIS, ROUTINE W REFLEX MICROSCOPIC
Bilirubin Urine: NEGATIVE
Glucose, UA: >=500 mg/dL — AB
Ketones, ur: NEGATIVE mg/dL
Leukocytes,Ua: NEGATIVE
Nitrite: NEGATIVE
Protein, ur: >=300 mg/dL — AB
Specific Gravity, Urine: 1.016 (ref 1.005–1.030)
pH: 5 (ref 5.0–8.0)

## 2019-06-16 LAB — RAPID URINE DRUG SCREEN, HOSP PERFORMED
Amphetamines: NOT DETECTED
Barbiturates: NOT DETECTED
Benzodiazepines: NOT DETECTED
Cocaine: NOT DETECTED
Opiates: NOT DETECTED
Tetrahydrocannabinol: NOT DETECTED

## 2019-06-16 LAB — PROTIME-INR
INR: 1 (ref 0.8–1.2)
Prothrombin Time: 12.8 seconds (ref 11.4–15.2)

## 2019-06-16 LAB — HEMOGLOBIN A1C
Hgb A1c MFr Bld: 7.6 % — ABNORMAL HIGH (ref 4.8–5.6)
Mean Plasma Glucose: 171.42 mg/dL

## 2019-06-16 LAB — ETHANOL: Alcohol, Ethyl (B): <10 mg/dL (ref <10)

## 2019-06-16 LAB — GLUCOSE, CAPILLARY
Glucose-Capillary: 282 mg/dL — ABNORMAL HIGH (ref 70–99)
Glucose-Capillary: 170 mg/dL — ABNORMAL HIGH (ref 70–99)

## 2019-06-16 LAB — FOLATE: Folate: 13.2 ng/mL (ref >5.9)

## 2019-06-16 LAB — APTT: aPTT: 31 seconds (ref 24–36)

## 2019-06-16 LAB — SARS CORONAVIRUS 2 (TAT 6-24 HRS): SARS Coronavirus 2: NEGATIVE

## 2019-06-16 LAB — VITAMIN B12: Vitamin B-12: 488 pg/mL (ref 180–914)

## 2019-06-16 LAB — MAGNESIUM: Magnesium: 1.9 mg/dL (ref 1.7–2.4)

## 2019-06-16 MED ORDER — HYDRALAZINE HCL 20 MG/ML IJ SOLN
5.0000 mg | Freq: Once | INTRAMUSCULAR | Status: AC
  Administered 2019-06-16: 5 mg via INTRAVENOUS
  Filled 2019-06-16: qty 1

## 2019-06-16 MED ORDER — SODIUM CHLORIDE 0.9% FLUSH
3.0000 mL | Freq: Two times a day (BID) | INTRAVENOUS | Status: DC
  Administered 2019-06-16 (×2): 3 mL via INTRAVENOUS

## 2019-06-16 MED ORDER — ACETAMINOPHEN 325 MG PO TABS: 650.0000 mg | ORAL_TABLET | Freq: Four times a day (QID) | ORAL | Status: DC | PRN

## 2019-06-16 MED ORDER — HYDRALAZINE HCL 10 MG PO TABS
10.0000 mg | ORAL_TABLET | Freq: Three times a day (TID) | ORAL | Status: DC
  Administered 2019-06-16 – 2019-06-17 (×2): 10 mg via ORAL
  Filled 2019-06-16 (×2): qty 1

## 2019-06-16 MED ORDER — ASPIRIN EC 81 MG PO TBEC
81.0000 mg | DELAYED_RELEASE_TABLET | Freq: Every day | ORAL | Status: DC
  Administered 2019-06-16 – 2019-06-17 (×2): 81 mg via ORAL
  Filled 2019-06-16 (×2): qty 1

## 2019-06-16 MED ORDER — FUROSEMIDE 20 MG PO TABS: 80.0000 mg | ORAL_TABLET | Freq: Every day | ORAL | Status: DC

## 2019-06-16 MED ORDER — ATORVASTATIN CALCIUM 80 MG PO TABS
80.0000 mg | ORAL_TABLET | Freq: Every day | ORAL | Status: DC
  Administered 2019-06-16 – 2019-06-17 (×2): 80 mg via ORAL
  Filled 2019-06-16 (×2): qty 1

## 2019-06-16 MED ORDER — INSULIN ASPART 100 UNIT/ML ~~LOC~~ SOLN: 0.0000 [IU] | Freq: Every day | SUBCUTANEOUS | Status: DC

## 2019-06-16 MED ORDER — ACETAMINOPHEN 650 MG RE SUPP: 650.0000 mg | Freq: Four times a day (QID) | RECTAL | Status: DC | PRN

## 2019-06-16 MED ORDER — ENSURE ENLIVE PO LIQD
237.0000 mL | Freq: Two times a day (BID) | ORAL | Status: DC
  Administered 2019-06-17: 237 mL via ORAL

## 2019-06-16 MED ORDER — FLUORESCEIN SODIUM 1 MG OP STRP
2.0000 | ORAL_STRIP | Freq: Once | OPHTHALMIC | Status: AC
  Administered 2019-06-16: 2 via OPHTHALMIC
  Filled 2019-06-16: qty 2

## 2019-06-16 MED ORDER — LABETALOL HCL 5 MG/ML IV SOLN
5.0000 mg | INTRAVENOUS | Status: DC | PRN
  Administered 2019-06-16 (×2): 5 mg via INTRAVENOUS
  Filled 2019-06-16 (×2): qty 4

## 2019-06-16 MED ORDER — INSULIN ASPART 100 UNIT/ML ~~LOC~~ SOLN
0.0000 [IU] | Freq: Three times a day (TID) | SUBCUTANEOUS | Status: DC
  Administered 2019-06-16: 8 [IU] via SUBCUTANEOUS
  Administered 2019-06-17: 2 [IU] via SUBCUTANEOUS
  Administered 2019-06-17: 5 [IU] via SUBCUTANEOUS

## 2019-06-16 MED ORDER — POTASSIUM CHLORIDE CRYS ER 20 MEQ PO TBCR
40.0000 meq | EXTENDED_RELEASE_TABLET | Freq: Two times a day (BID) | ORAL | Status: AC
  Administered 2019-06-16 (×2): 40 meq via ORAL
  Filled 2019-06-16 (×2): qty 2

## 2019-06-16 MED ORDER — ENOXAPARIN SODIUM 30 MG/0.3ML ~~LOC~~ SOLN
30.0000 mg | SUBCUTANEOUS | Status: DC
  Administered 2019-06-16 – 2019-06-17 (×2): 30 mg via SUBCUTANEOUS
  Filled 2019-06-16 (×2): qty 0.3

## 2019-06-16 MED ORDER — TETRACAINE HCL 0.5 % OP SOLN
2.0000 [drp] | Freq: Once | OPHTHALMIC | Status: AC
  Administered 2019-06-16: 2 [drp] via OPHTHALMIC
  Filled 2019-06-16: qty 4

## 2019-06-16 MED ORDER — GABAPENTIN 300 MG PO CAPS
300.0000 mg | ORAL_CAPSULE | Freq: Three times a day (TID) | ORAL | Status: DC
  Administered 2019-06-16 – 2019-06-17 (×3): 300 mg via ORAL
  Filled 2019-06-16 (×3): qty 1

## 2019-06-16 MED ORDER — HYDRALAZINE HCL 25 MG PO TABS: 25.0000 mg | ORAL_TABLET | Freq: Three times a day (TID) | ORAL | Status: DC

## 2019-06-16 NOTE — ED Provider Notes (Addendum)
[facility] EMERGENCY DEPARTMENT Provider Note   CSN: [id number] Arrival date & time: 06/15/19  1549     History No chief complaint on file.   Christopher Davila is a 48 y.o. male with history of poorly controlled diabetes on oral agents, diabetic neuropathy and nephropathy, poorly controlled hypertension, hyperlipidemia, alcohol use disorder presents to the ER for evaluation of elevated blood pressure readings, visual disturbances and feeling off balance.  States at baseline his SBP is around 180-190s at home.  He takes hydralazine 25 mg 3 times daily and has been compliant.  Yesterday he had a PCP appointment with [facility] and his blood pressure was noted to be SBP 200s in office.  He reported changes in his vision and PCP advised him to come to the ER.  Reports having bilateral blurred eye vision for a few months, right eye slightly worse than left.  This has been gradually worsening over months.  He has noticed worsening blurred vision bilaterally acutely over the last 2 days, right worse than left.  Right eye was sore and tearing 2 days ago and now his vision is almost completely black and blurred. He has a mild right sided periorbital temporal headache.  He is not sure if his blurred vision is from his diabetes as his high blood pressure.  Does not wear glasses. He has never had a full dilated eye exam.  Also reports for the last month he has felt like his balance is off.  He feels like he is "drunk" when he tries to walk.  He has fallen several times at work.  Denies mechanical falls.  Denies lightheadedness or faintness.  No associated chest pain or shortness of breath or palpitations.  Has cut back on alcohol and drinks 2 beers occasionally, last use 2 to 3 days ago.  Denies illicit drug use.  Took hydralazine 25 mg two days ago.   Denies chest pain, SOB, peripheral edema, syncope.    HPI     Past Medical History:  Diagnosis Date  . Diabetes mellitus   .  Gastroparesis due to DM ([place])   . Hypertension   . Neuropathy of lower extremity   . Pancreatitis 06/2012    Patient Active Problem List   Diagnosis Date Noted  . Hypertensive urgency 04/17/2019  . Hypokalemia 04/17/2019  . Hypoalbuminemia 04/17/2019  . Normocytic anemia 04/17/2019  . Acute kidney injury superimposed on chronic kidney disease ([place])   . Heart failure ([place]) 02/13/2019  . Epigastric abdominal pain   . Hyperglycemia   . Hypoxia   . Wheezing   . Acute renal failure superimposed on chronic kidney disease ([place])   . Alcohol use   . Gastroparesis due to DM ([place])   . Essential hypertension   . Nausea   . Dyspnea   . Hyperlipidemia   . Chest pain 02/14/2014  . Abdominal pain 12/28/2012  . Diabetes mellitus type 2 with complications, uncontrolled ([place]) 06/26/2012  . Hypertension 06/26/2012  . Pancreatitis 06/26/2012  . Weight loss 06/26/2012  . Oral candidiasis 06/26/2012    History reviewed. No pertinent surgical history.     Family History  Problem Relation Age of Onset  . Diabetes Mother   . Lung disease Mother   . Diabetes Maternal Aunt   . CAD Maternal Aunt   . CAD Cousin     Social History   Tobacco Use  . Smoking status: Former Smoker    Packs/day: 0.00    Types: Cigarettes  Quit date: 03/22/2015    Years since quitting: 4.2  . Smokeless tobacco: Never Used  Substance Use Topics  . Alcohol use: Yes    Comment: on the weekends per patient  . Drug use: No    Comment: per pt* smoking only two cigarettes per day, trying to quit     Home Medications Prior to Admission medications   Medication Sig Start Date End Date Taking? Authorizing Provider  aspirin EC 81 MG tablet Take 1 tablet (81 mg total) by mouth daily. 05/24/15  Yes Ghimire, Henreitta Leber, MD  atorvastatin (LIPITOR) 80 MG tablet Take 1 tablet (80 mg total) by mouth daily. 02/19/19  Yes Gladys Damme, MD  carvedilol (COREG) 25 MG tablet Take 1 tablet (25 mg total) by mouth 2 (two) times  daily with a meal. 02/19/19  Yes Gladys Damme, MD  furosemide (LASIX) 80 MG tablet Take 80 mg by mouth.   Yes [provider]  gabapentin (NEURONTIN) 300 MG capsule Take 1 capsule (300 mg total) by mouth 3 (three) times daily. 02/19/19  Yes Gladys Damme, MD  glipiZIDE (GLUCOTROL XL) 5 MG 24 hr tablet Take 1 tablet (5 mg total) by mouth daily with breakfast. 04/18/19 06/16/19 Yes Pahwani, Einar Grad, MD  hydrALAZINE (APRESOLINE) 25 MG tablet Take 1 tablet (25 mg total) by mouth 3 (three) times daily. 04/18/19 06/16/19 Yes Pahwani, Einar Grad, MD  potassium chloride SA (KLOR-CON) 20 MEQ tablet Take 2 tablets (40 mEq total) by mouth daily. 04/18/19 06/16/19 Yes Pahwani, Einar Grad, MD  amLODipine (NORVASC) 10 MG tablet Take 10 mg by mouth daily.    [provider]  clotrimazole (LOTRIMIN) 1 % cream Apply topically 2 (two) times daily. 02/19/19   Gladys Damme, MD  sitaGLIPtin (JANUVIA) 50 MG tablet Take 1 tablet (50 mg total) by mouth daily. 04/18/19 05/18/19  Darliss Cheney, MD    Allergies    Morphine and related  Review of Systems   Review of Systems  Eyes: Positive for pain, discharge and visual disturbance.  Neurological: Positive for headaches.       Gait/balance issues   All other systems reviewed and are negative.   Physical Exam Updated Vital Signs BP (!) 158/109   Pulse 96   Temp 97.9 F (36.6 C) (Oral)   Resp 16   SpO2 100%   Physical Exam Vitals and nursing note reviewed.  Constitutional:      Appearance: He is well-developed.     Comments: NAD.  HENT:     Head: Normocephalic and atraumatic.     Right Ear: External ear normal.     Left Ear: External ear normal.     Nose: Nose normal.  Eyes:     Conjunctiva/sclera: Conjunctivae normal.     Pupils:     Right eye: Pupil is sluggish.     Visual Fields:     Right eye: ABS in the upper temporal quadrant. DM in the lower temporal quadrant.     Comments: Patient keeps eyes closed during exam.  Subtle unequal pupils R>L     Right eye: Mild scleral injection.  Pupil is approximately 3 mm, round and sluggish to light.?Fixed.  EOMs intact and painless.  No eyelid or periorbital edema, erythema.  No temporal tenderness.  IOP 21  Left eye: Conjunctiva and sclera normal.  Pupils approximately 2 mm round, reactive.  EOMs intact and painless.  No eyelid or periorbital edema or erythema.  No temple tenderness.  IOP 19  Cardiovascular:  Rate and Rhythm: Normal rate and regular rhythm.     Heart sounds: Normal heart sounds.  Pulmonary:     Effort: Pulmonary effort is normal.     Breath sounds: Normal breath sounds.  Musculoskeletal:        General: Normal range of motion.     Cervical back: Normal range of motion and neck supple.  Skin:    General: Skin is warm and dry.     Capillary Refill: Capillary refill takes less than 2 seconds.  Neurological:     Mental Status: He is alert and oriented to person, place, and time.     Coordination: Finger-Nose-Finger Test abnormal.     Gait: Gait abnormal.     Comments:   Mental Status:  Patient is awake, alert, oriented to person, place, year, and situation.  Patient is able to give a clear and coherent history.  Speech is fluent and clear without dysarthria or aphasia.  No signs of neglect.  Cranial Nerves: I not tested II diminished RIGHT temporal upper/lower visual field, possibly due to reported loss of vision.  LEFT temporal visual fields intact.  ?Fixed sluggish round RIGHT pupil.  LET pupil RRL.  Unable to visualize posterior eye. III, IV, VI EOMs intact without ptosis or diplopia  V sensation to light touch intact in all 3 divisions of trigeminal nerve bilaterally  VII facial movements symmetric bilaterally VIII hearing intact to voice/conversation  IX, X no uvula deviation, symmetric rise of soft palate/uvula XI 5/5 SCM and trapezius strength bilaterally  XII tongue protrusion midline, symmetric L/R movements  Motor: Strength 5/5 in upper/lower extremities  .   Sensation to light touch intact in face, upper/lower extremities. No pronator drift. No leg drop.  Cerebellar: Questionable ataxia with finger to nose BILATERALLY possibly due to right eye visual disturbance.  No ataxia with heel to shin.  Wide stance, cautious gait. Patient unable to perform Romberg, heel to toe walk and needed spotter to prevent fall.   Psychiatric:        Behavior: Behavior normal.        Thought Content: Thought content normal.        Judgment: Judgment normal.     ED Results / Procedures / Treatments   Labs (all labs ordered are listed, but only abnormal results are displayed) Labs Reviewed  BASIC METABOLIC PANEL - Abnormal; Notable for the following components:      Result Value   Potassium 3.1 (*)    CO2 21 (*)    Glucose, Bld 264 (*)    BUN 24 (*)    Creatinine, Ser 3.07 (*)    Calcium 8.5 (*)    GFR calc non Af Amer 23 (*)    GFR calc Af Amer 27 (*)    All other components within normal limits  CBC - Abnormal; Notable for the following components:   RBC 3.67 (*)    Hemoglobin 10.8 (*)    HCT 32.2 (*)    All other components within normal limits  URINALYSIS, ROUTINE W REFLEX MICROSCOPIC - Abnormal; Notable for the following components:   APPearance HAZY (*)    Glucose, UA >=500 (*)    Hgb urine dipstick SMALL (*)    Protein, ur >=300 (*)    Bacteria, UA RARE (*)    All other components within normal limits  TROPONIN I (HIGH SENSITIVITY) - Abnormal; Notable for the following components:   Troponin I (High Sensitivity) 19 (*)    All other  components within normal limits  ETHANOL  PROTIME-INR  APTT  RAPID URINE DRUG SCREEN, HOSP PERFORMED  VITAMIN B12  FOLATE  TROPONIN I (HIGH SENSITIVITY)    EKG EKG Interpretation  Date/Time:  Tuesday June 15 2019 16:31:53 EST Ventricular Rate:  102 PR Interval:  148 QRS Duration: 84 QT Interval:  370 QTC Calculation: 482 R Axis:   52 Text Interpretation: Sinus tachycardia T wave  abnormality, consider inferolateral ischemia Abnormal ECG No significant change since last tracing Confirmed by Orpah Greek [phone]) on 06/16/2019 6:07:47 AM   Radiology CT Head Wo Contrast  Result Date: 06/15/2019 CLINICAL DATA:  Loss of vision in right eye. EXAM: CT HEAD WITHOUT CONTRAST TECHNIQUE: Contiguous axial images were obtained from the base of the skull through the vertex without intravenous contrast. COMPARISON:  None. FINDINGS: Brain: No evidence of acute infarction, hemorrhage, hydrocephalus, extra-axial collection or mass lesion/mass effect. Atrophy is noted, greater than expected for the patient's age. Confluent white matter disease is noted, greater than expected for the patient's age. Vascular: No hyperdense vessel or unexpected calcification. Skull: Normal. Negative for fracture or focal lesion. Sinuses/Orbits: No acute finding. Other: None. IMPRESSION: 1. Atrophy and white matter disease, greater than expected for the patient's age. 2. No acute intracranial abnormality. Electronically Signed   By: Constance Holster M.D.   On: 06/15/2019 18:24    Procedures .Critical Care Performed by: Kinnie Feil, PA-C Authorized by: Kinnie Feil, PA-C   Critical care provider statement:    Critical care time (minutes):  45   Critical care was necessary to treat or prevent imminent or life-threatening deterioration of the following conditions: hypertensive emergency, multiple consults, admission.   Critical care was time spent personally by me on the following activities:  Discussions with consultants, evaluation of patient's response to treatment, examination of patient, ordering and performing treatments and interventions, ordering and review of laboratory studies, ordering and review of radiographic studies, pulse oximetry, re-evaluation of patient's condition, obtaining history from patient or surrogate, review of old charts and development of treatment plan with  patient or surrogate   I assumed direction of critical care for this patient from another provider in my specialty: no     (including critical care time)  Medications Ordered in ED Medications  sodium chloride flush (NS) 0.9 % injection 3 mL (has no administration in time range)  fluorescein ophthalmic strip 2 strip (2 strips Both Eyes Given 06/16/19 0905)  tetracaine (PONTOCAINE) 0.5 % ophthalmic solution 2 drop (2 drops Both Eyes Given by Other 06/16/19 0906)  hydrALAZINE (APRESOLINE) injection 5 mg (5 mg Intravenous Given 06/16/19 [id number])    ED Course  I have reviewed the triage vital signs and the nursing notes.  Pertinent labs & imaging results that were available during my care of the patient were reviewed by me and considered in my medical decision making (see chart for details).  Clinical Course as of Jun 15 1138  Wed Jun 16, 2019  0700 IMPRESSION: 1. Atrophy and white matter disease, greater than expected for the patient's age. 2. No acute intracranial abnormality.    CT Head Wo Contrast [CG]  0853 Spoke to Dr. Romeo Apple assistant to relay patient information.  Initially recommends if able patient can be seen in the office today at 115.  Will discuss with Dr. And call me back   [CG]  0950 MRI without brain per Rory Percy recommendations, treat SBP if > 180 as needed for permissive hypertension    [CG]  Clinical Course User Index [CG] Kinnie Feil, PA-C   MDM Rules/Calculators/A&P                      EMR reviewed to assist with MDM  Concern for acute stroke vs hypertensive emergency vs worsening diabetic/hypertensive retinopathy.  H/o documented ETOH use also may be contributing although appears clinical sober.   ER work up personally reviewed. Slighty elevation in creatinine compared to bsaeline.  Stable anemia.  Hyperglycemia without DKA or AG. EKG and troponin obtained in triage > 18 hrs ago but pt is not complaining of any CP. Trop 16 > 18.    Right eye with  mild scleral injection but normal IOP, EOMs, surrounding periorbital skin. No temporal tenderness.  Patient reports vision is "black" "blurred", can make out letters at a distance but incorrectly.  Decreased visual acuity as well on left eye.   Spoke to Dr Julious Oka with ophthalmology who suspects visual disturbances could be from retinopathy related to poorly controlled DM and HTN. Pt has never had an eye exam.  No recommendations for emergent ophthalmology interventions or MRI specific imaging.   Spoke to Dr Rory Percy who suspects symptoms likely due to hypertensive process, recommends MRI and permissive BP control.    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 He does not drive. Reports falling at work due to issues with balance.  On exam he has a slow cautious gait, unable to perform Romberg, heel to shin.  Concern about ability to f/u as outpatient with PCP, ophthalmology.  Will benefit from admission for BP control/med adjustment, PT/Ot evaluation for gait.    Given hydralazine here with improvement in BP.   Folate and B12 added.  IM to admit  Final Clinical Impression(s) / ED Diagnoses Final diagnoses:  Hypertensive emergency  Visual disturbances    Rx / DC Orders ED Discharge Orders    None       Kinnie Feil, PA-C 06/16/19 [place], Nathan, MD 06/16/19 1537

## 2019-06-16 NOTE — Progress Notes (Signed)
Patient arrived to 3W07. A&O x4. Denies pain. POC provided to patient. Call bell within reach. Nurse will continue to monitor.

## 2019-06-16 NOTE — ED Notes (Signed)
Pt aware that a urine sample it needed; urinal at bedside

## 2019-06-16 NOTE — ED Notes (Signed)
ED TO INPATIENT HANDOFF REPORT  ED Nurse Name and Phone #:  Meenakshi Sazama [phone]  S Name/Age/Gender Christopher Davila 49 y.o. male Room/Bed: 019C/019C  Code Status   Code Status: Full Code  Home/SNF/Other Home Patient oriented to: self, place, time and situation Is this baseline? Yes   Triage Complete: Triage complete  Chief Complaint Acute loss of vision, right [H53.131]  Triage Note Pt here today with c/o htn and loss of vision in his right eye , pt was WLED 1 week ago for blurred vision , pt state sthat he takes his b/p meds it just doesn't work     Allergies Allergies  Allergen Reactions  . Morphine And Related Itching and Other (See Comments)    Pt prefers not to be given this drug    Level of Care/Admitting Diagnosis ED Disposition    ED Disposition Condition [facility]: [place] [[id number]]  Level of Care: Progressive [102]  Admit to Progressive based on following criteria: NEUROLOGICAL AND NEUROSURGICAL complex patients with significant risk of instability, who do not meet ICU criteria, yet require close observation or frequent assessment (< / = every 2 - 4 hours) with medical / nursing intervention.  Admit to Progressive based on following criteria: CARDIOVASCULAR & THORACIC of moderate stability with acute coronary syndrome symptoms/low risk myocardial infarction/hypertensive urgency/arrhythmias/heart failure potentially compromising stability and stable post cardiovascular intervention patients.  Covid Evaluation: Asymptomatic Screening Protocol (No Symptoms)  Diagnosis: Acute loss of vision, right [id number]  Admitting Physician: Lucious Groves [[id number]]  Attending Physician: HOFFMAN, ERIK C [[id number]]  PT Class (Do Not Modify): Observation [104]  PT Acc Code (Do Not Modify): Observation [10022]       B Medical/Surgery History Past Medical History:  Diagnosis Date  . Abdominal pain 12/28/2012  . Acute kidney injury superimposed  on chronic kidney disease ([place])   . Chest pain 02/14/2014  . Diabetes mellitus   . Dyspnea   . Epigastric abdominal pain   . Gastroparesis due to DM ([place])   . Hypertension   . Hypoalbuminemia 04/17/2019  . Hypoxia   . Nausea   . Neuropathy of lower extremity   . Oral candidiasis 06/26/2012  . Pancreatitis 06/2012  . Pancreatitis 06/26/2012  . Weight loss 06/26/2012  . Wheezing    History reviewed. No pertinent surgical history.   A IV Location/Drains/Wounds Patient Lines/Drains/Airways Status   Active Line/Drains/Airways    Name:   Placement date:   Placement time:   Site:   Days:   Peripheral IV 06/16/19 Right Antecubital   06/16/19    [id number]    Antecubital   less than 1   Incision (Closed) 02/18/19 Lumbar Left   02/18/19    1045     118          Intake/Output Last 24 hours No intake or output data in the 24 hours ending 06/16/19 1652  Labs/Imaging Results for orders placed or performed during the hospital encounter of 06/15/19 (from the past 48 hour(s))  Basic metabolic panel     Status: Abnormal   Collection Time: 06/15/19  4:42 PM  Result Value Ref Range   Sodium 135 135 - 145 mmol/L   Potassium 3.1 (L) 3.5 - 5.1 mmol/L   Chloride 102 98 - 111 mmol/L   CO2 21 (L) 22 - 32 mmol/L   Glucose, Bld 264 (H) 70 - 99 mg/dL   BUN 24 (H) 6 - 20 mg/dL   Creatinine,  Ser 3.07 (H) 0.61 - 1.24 mg/dL   Calcium 8.5 (L) 8.9 - 10.3 mg/dL   GFR calc non Af Amer 23 (L) >60 mL/min   GFR calc Af Amer 27 (L) >60 mL/min   Anion gap 12 5 - 15    Comment: Performed at [facility] [address], [place], [place] [address]  CBC     Status: Abnormal   Collection Time: 06/15/19  4:42 PM  Result Value Ref Range   WBC 8.6 4.0 - 10.5 K/uL   RBC 3.67 (L) 4.22 - 5.81 MIL/uL   Hemoglobin 10.8 (L) 13.0 - 17.0 g/dL   HCT 32.2 (L) 39.0 - 52.0 %   MCV 87.7 80.0 - 100.0 fL   MCH 29.4 26.0 - 34.0 pg   MCHC 33.5 30.0 - 36.0 g/dL   RDW 12.8 11.5 - 15.5 %   Platelets 325 150 - 400 K/uL   nRBC  0.0 0.0 - 0.2 %    Comment: Performed at [facility], [place] [address], [place], [place] [address]  Troponin I (High Sensitivity)     Status: None   Collection Time: 06/15/19  4:42 PM  Result Value Ref Range   Troponin I (High Sensitivity) 16 <18 ng/L    Comment: (NOTE) Elevated high sensitivity troponin I (hsTnI) values and significant  changes across serial measurements may suggest ACS but many other  chronic and acute conditions are known to elevate hsTnI results.  Refer to the "Links" section for chest pain algorithms and additional  guidance. Performed at [facility], [place] [address] [place], [place] [address]   Troponin I (High Sensitivity)     Status: Abnormal   Collection Time: 06/15/19  8:10 PM  Result Value Ref Range   Troponin I (High Sensitivity) 19 (H) <18 ng/L    Comment: (NOTE) Elevated high sensitivity troponin I (hsTnI) values and significant  changes across serial measurements may suggest ACS but many other  chronic and acute conditions are known to elevate hsTnI results.  Refer to the "Links" section for chest pain algorithms and additional  guidance. Performed at [facility], [place] [address], [place], [place] [address]   Ethanol     Status: None   Collection Time: 06/16/19  9:04 AM  Result Value Ref Range   Alcohol, Ethyl (B) <10 <10 mg/dL    Comment: (NOTE) Lowest detectable limit for serum alcohol is 10 mg/dL. For medical purposes only. Performed at [facility], [place] [address], [place], [place] [address]   Protime-INR     Status: None   Collection Time: 06/16/19  9:04 AM  Result Value Ref Range   Prothrombin Time 12.8 11.4 - 15.2 seconds   INR 1.0 0.8 - 1.2    Comment: (NOTE) INR goal varies based on device and disease states. Performed at [facility], [place] [address], [place], [place] [address]   APTT     Status: None   Collection Time: 06/16/19  9:04 AM  Result Value Ref Range   aPTT 31 24 - 36 seconds     Comment: Performed at [facility] [address] [place], [place] [address]  Magnesium     Status: None   Collection Time: 06/16/19  9:04 AM  Result Value Ref Range   Magnesium 1.9 1.7 - 2.4 mg/dL    Comment: Performed at [facility] [address] [place], [place] [address]  Urine  rapid drug screen (hosp performed)     Status: None   Collection Time: 06/16/19 10:40 AM  Result Value Ref Range   Opiates NONE DETECTED NONE DETECTED   Cocaine NONE DETECTED NONE DETECTED   Benzodiazepines NONE DETECTED NONE DETECTED   Amphetamines NONE DETECTED NONE DETECTED   Tetrahydrocannabinol NONE DETECTED NONE DETECTED   Barbiturates NONE DETECTED NONE DETECTED    Comment: (NOTE) DRUG SCREEN FOR MEDICAL PURPOSES ONLY.  IF CONFIRMATION IS NEEDED FOR ANY PURPOSE, NOTIFY LAB WITHIN 5 DAYS. LOWEST DETECTABLE LIMITS FOR URINE DRUG SCREEN Drug Class                     Cutoff (ng/mL) Amphetamine and metabolites    1000 Barbiturate and metabolites    200 Benzodiazepine                 [id number] Tricyclics and metabolites     300 Opiates and metabolites        300 Cocaine and metabolites        300 THC                            50 Performed at [facility], [place] [address], [place], [place] [address]   Urinalysis, Routine w reflex microscopic     Status: Abnormal   Collection Time: 06/16/19 10:40 AM  Result Value Ref Range   Color, Urine YELLOW YELLOW   APPearance HAZY (A) CLEAR   Specific Gravity, Urine 1.016 1.005 - 1.030   pH 5.0 5.0 - 8.0   Glucose, UA >=500 (A) NEGATIVE mg/dL   Hgb urine dipstick SMALL (A) NEGATIVE   Bilirubin Urine NEGATIVE NEGATIVE   Ketones, ur NEGATIVE NEGATIVE mg/dL   Protein, ur >=300 (A) NEGATIVE mg/dL   Nitrite NEGATIVE NEGATIVE   Leukocytes,Ua NEGATIVE NEGATIVE   RBC / HPF 0-5 0 - 5 RBC/hpf   WBC, UA 0-5 0 - 5 WBC/hpf   Bacteria, UA RARE (A) NONE SEEN   Squamous Epithelial / LPF 0-5 0 - 5   Mucus PRESENT     Comment: Performed at [facility], [place] [address], [place], [place] [address]  Vitamin B12     Status: None   Collection Time: 06/16/19 11:15 AM  Result Value Ref Range   Vitamin B-12 488 180 - 914 pg/mL    Comment: (NOTE) This assay is not validated for testing neonatal or myeloproliferative syndrome specimens for Vitamin B12 levels. Performed at [facility], [place] [address], [place], [place] [address]   Folate     Status: None   Collection Time: 06/16/19 11:15 AM  Result Value Ref Range   Folate 13.2 >5.9 ng/mL    Comment: Performed at [facility] [address], [place], [place] [address]  Hemoglobin A1c     Status: Abnormal   Collection Time: 06/16/19 12:30 PM  Result Value Ref Range   Hgb A1c MFr Bld 7.6 (H) 4.8 - 5.6 %    Comment: (NOTE) Pre diabetes:          5.7%-6.4% Diabetes:              >6.4% Glycemic control for   <7.0% adults with diabetes    Mean Plasma Glucose 171.42 mg/dL    Comment: Performed at [facility] [address], [place], [place] [address]   CT Head Wo Contrast  Result Date:  06/15/2019 CLINICAL DATA:  Loss of vision in right eye. EXAM: CT HEAD WITHOUT CONTRAST TECHNIQUE: Contiguous axial images were obtained from the base of the skull through the vertex without intravenous contrast. COMPARISON:  None. FINDINGS: Brain: No evidence of acute infarction, hemorrhage, hydrocephalus, extra-axial collection or mass lesion/mass effect. Atrophy is noted, greater than expected for the patient's age. Confluent white matter disease is noted, greater than expected for the patient's age. Vascular: No hyperdense vessel or unexpected calcification. Skull: Normal. Negative for fracture or focal lesion. Sinuses/Orbits: No acute finding. Other: None. IMPRESSION: 1. Atrophy and white matter disease, greater than expected for the patient's age. 2. No acute intracranial abnormality. Electronically Signed   By: Constance Holster M.D.   On: 06/15/2019 18:24   MR BRAIN WO  CONTRAST  Result Date: 06/16/2019 CLINICAL DATA:  Ataxia and blurred vision in the setting of poorly controlled hypertension and diabetes. EXAM: MRI HEAD WITHOUT CONTRAST TECHNIQUE: Multiplanar, multiecho pulse sequences of the brain and surrounding structures were obtained without intravenous contrast. COMPARISON:  Head CT 06/15/2019 FINDINGS: The patient terminated the examination prior to completion. Axial and coronal diffusion, sagittal T1, axial FLAIR, and axial T2 sequences were obtained and are motion degraded including severe motion on the sagittal T1 sequences. Brain: There is a chronic infarct involving the left caudate nucleus and internal capsule with evidence of chronic blood products. Mild diffusion weighted signal abnormality along the margins of the infarct is attributed to susceptibility artifact. No acute infarct is evident elsewhere. There is no intracranial mass effect or extra-axial fluid collection. Patchy to confluent T2 hyperintensities in the cerebral white matter bilaterally are nonspecific but compatible with moderately to severely age advanced chronic small vessel ischemic disease. Cerebral atrophy is moderately advanced for age. Vascular: Major intracranial vascular flow voids are grossly preserved. Skull and upper cervical spine: No gross skull lesion. Sinuses/Orbits: Grossly unremarkable orbits. Partially visualized mucosal thickening or mucous retention cyst in the left maxillary sinus. No significant mastoid fluid. Other: None. IMPRESSION: 1. Motion degraded, incomplete examination. 2. No acute infarct. 3. Chronic hemorrhagic left basal ganglia infarct. 4. Advanced chronic small vessel ischemic disease and cerebral atrophy. Electronically Signed   By: Logan Bores M.D.   On: 06/16/2019 15:31    Pending Labs Unresulted Labs (From admission, onward)    Start     Ordered   06/23/19 0500  Creatinine, serum  (enoxaparin (LOVENOX)    CrCl < 30 ml/min)  Weekly,   R    Comments:  while on enoxaparin therapy.    06/16/19 1205   06/17/19 [id number]  Basic metabolic panel  Tomorrow morning,   R     06/16/19 1205   06/17/19 0500  CBC  Tomorrow morning,   R     06/16/19 1205   06/16/19 1213  SARS CORONAVIRUS 2 (TAT 6-24 HRS) Nasopharyngeal Nasopharyngeal Swab  (Tier 3 (TAT 6-24 hrs))  Once,   STAT    Question Answer Comment  Is this test for diagnosis or screening Screening   Symptomatic for COVID-19 as defined by CDC No   Hospitalized for COVID-19 No   Admitted to ICU for COVID-19 No   Previously tested for COVID-19 Yes   Resident in a congregate (group) care setting No   Employed in healthcare setting No      06/16/19 1212          Vitals/Pain Today's Vitals   06/16/19 1200 06/16/19 1400 06/16/19 1549 06/16/19 1600  BP: (!) 183/116 (!) 159/100 Marland Kitchen)  163/97 (!) 177/92  Pulse: 98 95 (!) 102 (!) 105  Resp: 15 20 19  (!) 23  Temp:      TempSrc:      SpO2: 100% 100% 100% 100%  PainSc:        Isolation Precautions No active isolations  Medications Medications  sodium chloride flush (NS) 0.9 % injection 3 mL (has no administration in time range)  enoxaparin (LOVENOX) injection 30 mg (30 mg Subcutaneous Given 06/16/19 1352)  sodium chloride flush (NS) 0.9 % injection 3 mL (3 mLs Intravenous Given 06/16/19 1351)  potassium chloride SA (KLOR-CON) CR tablet 40 mEq (40 mEq Oral Given 06/16/19 1347)  acetaminophen (TYLENOL) tablet 650 mg (has no administration in time range)    Or  acetaminophen (TYLENOL) suppository 650 mg (has no administration in time range)  insulin aspart (novoLOG) injection 0-15 Units (has no administration in time range)  insulin aspart (novoLOG) injection 0-5 Units (has no administration in time range)  aspirin EC tablet 81 mg (81 mg Oral Given 06/16/19 1347)  atorvastatin (LIPITOR) tablet 80 mg (80 mg Oral Given 06/16/19 1550)  gabapentin (NEURONTIN) capsule 300 mg (300 mg Oral Given 06/16/19 1550)  labetalol (NORMODYNE) injection 5 mg (5  mg Intravenous Given 06/16/19 1348)  fluorescein ophthalmic strip 2 strip (2 strips Both Eyes Given 06/16/19 0905)  tetracaine (PONTOCAINE) 0.5 % ophthalmic solution 2 drop (2 drops Both Eyes Given by Other 06/16/19 0906)  hydrALAZINE (APRESOLINE) injection 5 mg (5 mg Intravenous Given 06/16/19 0906)    Mobility walks Low fall risk   Focused Assessments Neuro Assessment Handoff:  Swallow screen pass? Yes    NIH Stroke Scale ( + Modified Stroke Scale Criteria)  Interval: Initial Level of Consciousness (1a.)   : Alert, keenly responsive LOC Questions (1b. )   +: Answers both questions correctly LOC Commands (1c. )   + : Performs both tasks correctly Best Gaze (2. )  +: Normal Visual (3. )  +: No visual loss Facial Palsy (4. )    : Normal symmetrical movements Motor Arm, Left (5a. )   +: No drift Motor Arm, Right (5b. )   +: No drift Motor Leg, Left (6a. )   +: No drift Motor Leg, Right (6b. )   +: No drift Limb Ataxia (7. ): Absent Sensory (8. )   +: Normal, no sensory loss Best Language (9. )   +: No aphasia Dysarthria (10. ): Normal Extinction/Inattention (11.)   +: No Abnormality Modified SS Total  +: 0 Complete NIHSS TOTAL: 0     Neuro Assessment: Within Defined Limits Neuro Checks:   Initial (06/16/19 1041)  Last Documented NIHSS Modified Score: 0 (06/16/19 1041) Has TPA been given? No If patient is a Neuro Trauma and patient is going to OR before floor call report to [place] nurse: [phone] or [phone]     R Recommendations: See Admitting Provider Note  Report given to:   Additional Notes:

## 2019-06-16 NOTE — H&P (Signed)
Date: 06/16/2019               Patient Name:  Christopher Davila MRN: [id number]  DOB: [birth year] Age / Sex: 48 y.o., male   PCP: Placey, Audrea Muscat, NP         Medical Service: Internal Medicine Teaching Service         Attending Physician: Dr. Lucious Groves, DO    First Contact: Dr. Charleen Kirks Pager: [phone]  Second Contact: Dr. Koleen Distance Pager: [phone]       After Hours (After 5p/  First Contact Pager: [phone]  weekends / holidays): Second Contact Pager: [phone]   Chief Complaint: Worsening Vision Loss  History of Present Illness: Christopher Davila is a 48 yo M with Hx of DM, HTN, HFpEF, CKD, and HLD who presented with worsening of ongoing vision loss and hypertensive urgency. He states he had gradual vision loss in his right eye for the past 2 months with it becoming blurry. This vision loss accelerated over the past 2 days in the setting of him not taking his medications and noting BP in the 180s-190s at home. His vision progressed to where it is very dim, almost black, in his right eye per his report. He went to his PCP yesterday and BP was noted to be >200 so he was sent to the ED with concern for Hypertensive emergency. His vision loss is non-painful but he report intermittent periorbital pain.  He also reports one month of constant balance problems. He states that when he stand and walks he feels unsteady, as if he is drunk. He denies lightheadedness or room spinning sensation. He also denies fevers, chest pain, SOB, Edema, hearing changes, constipation, diarrhea, or urinary changes.  In the ED: Labs workup was significant for K 3.1, Cr 3 (baseline ~2.7), Glucose >200, Hgb 10.8 (at baseline). Troponin was negative as was ETOH. U/A and UDS were pending. CT Head showed white matter degeneration, but acute changes. Opthalmology was consulted and believe his issues are related to chronic hypertensive and diabetic retinopathy. Neuro was consulted and believe it is likely hypertensive related,  but recommended and MRI.  Meds:  Current Meds  Medication Sig  . aspirin EC 81 MG tablet Take 1 tablet (81 mg total) by mouth daily.  Marland Kitchen atorvastatin (LIPITOR) 80 MG tablet Take 1 tablet (80 mg total) by mouth daily.  . carvedilol (COREG) 25 MG tablet Take 1 tablet (25 mg total) by mouth 2 (two) times daily with a meal.  . gabapentin (NEURONTIN) 300 MG capsule Take 1 capsule (300 mg total) by mouth 3 (three) times daily.  Marland Kitchen glipiZIDE (GLUCOTROL XL) 5 MG 24 hr tablet Take 1 tablet (5 mg total) by mouth daily with breakfast.  . hydrALAZINE (APRESOLINE) 25 MG tablet Take 1 tablet (25 mg total) by mouth 3 (three) times daily.  . potassium chloride SA (KLOR-CON) 20 MEQ tablet Take 2 tablets (40 mEq total) by mouth daily.     Allergies: Allergies as of 06/15/2019 - Review Complete 06/15/2019  Allergen Reaction Noted  . Morphine and related Itching and Other (See Comments) 12/28/2012   Past Medical History:  Diagnosis Date  . Diabetes mellitus   . Gastroparesis due to DM ([place])   . Hypertension   . Neuropathy of lower extremity   . Pancreatitis 06/2012    Family History:  Family History  Problem Relation Age of Onset  . Diabetes Mother   . Lung disease Mother   . Hypertension Mother   .  Diabetes Maternal Aunt   . CAD Maternal Aunt   . CAD Cousin   Reviewed on admission  Social History:  Social History   Tobacco Use  . Smoking status: Former Smoker    Packs/day: 0.00    Types: Cigarettes    Quit date: 03/22/2015    Years since quitting: 4.2  . Smokeless tobacco: Never Used  Substance Use Topics  . Alcohol use: Not Currently  . Drug use: No  Reviewed on admission  Review of Systems: A complete ROS was negative except as per HPI.   Physical Exam: Blood pressure (!) 183/116, pulse 98, temperature 97.9 F (36.6 C), temperature source Oral, resp. rate 15, SpO2 100 %. Physical Exam Constitutional:      General: He is not in acute distress.    Appearance: Normal  appearance.  Cardiovascular:     Rate and Rhythm: Normal rate and regular rhythm.     Pulses: Normal pulses.     Heart sounds: Normal heart sounds.  Pulmonary:     Effort: Pulmonary effort is normal. No respiratory distress.     Breath sounds: Normal breath sounds.  Abdominal:     General: Bowel sounds are normal. There is no distension.     Palpations: Abdomen is soft.     Tenderness: There is no abdominal tenderness.  Musculoskeletal:        General: No swelling or deformity.  Skin:    General: Skin is warm and dry.  Neurological:     General: No focal deficit present.     Mental Status: Mental status is at baseline.    EKG: personally reviewed my interpretation is Sinus tachycardia with TWI in inferior-lateral leads, similar to previous.  CT Head:IMPRESSION: 1. Atrophy and white matter disease, greater than expected for the patient's age. 2. No acute intracranial abnormality.  Assessment & Plan by Problem: Active Problems:   Acute loss of vision, right  Hypertensive Urgency Acute vision loss: Presents with Acute worsening of gradual vision loss, non-painful, but with intermittent periorbital pain (?headache). Etiology unclear at this time. Differential includes Progressive diabetic/hypertensive retinopathy, Hypertensive Emergency, or Retinal Artery Occlusion. Opthalmology was consulted and believe his issues are related to chronic hypertensive and diabetic retinopathy. Neuro was consulted and believe it is likely hypertensive related, but recommended and MRI. - Appreciate Neurology recommendation - Permissive HTN to [id number] systolic for now - Hold home lasix, Carvedilol, and hydralazine - F/U MRI Results - PRN Labetalol  Hypokalemia: K 3.2 in ED - KCl 32mEq BID x2 - Add-on Mg - AM BMP  Diabetes: On glipizide at home, prescribed Januvia but has not started this per patient. Off medications for the past several days. - SSI-M  HFpEF: Last echo in 2020. On lasix 80mg  Daily  and Carvedilol at home. Has been of medications for a few days. No edema, rales, nor SOB. Will hold for now for permission HTN and resume when appropriate. - Hold Lasix 80mg  Daily and Carvedilol  HLD, ?Hx of CVA (per patient): Continue Statin and ASA   FEN: HH/CM VTE ppx: Lovenox Code Status: FULL   Dispo: Admit patient to Observation with expected length of stay less than 2 midnights.  Signed: Neva Seat, MD 06/16/2019, 12:55 PM  Pager: [phone]

## 2019-06-17 LAB — BASIC METABOLIC PANEL
Anion gap: 11 (ref 5–15)
BUN: 29 mg/dL — ABNORMAL HIGH (ref 6–20)
CO2: 19 mmol/L — ABNORMAL LOW (ref 22–32)
Calcium: 8 mg/dL — ABNORMAL LOW (ref 8.9–10.3)
Chloride: 104 mmol/L (ref 98–111)
Creatinine, Ser: 3.08 mg/dL — ABNORMAL HIGH (ref 0.61–1.24)
GFR calc Af Amer: 27 mL/min — ABNORMAL LOW (ref >60)
GFR calc non Af Amer: 23 mL/min — ABNORMAL LOW (ref >60)
Glucose, Bld: 196 mg/dL — ABNORMAL HIGH (ref 70–99)
Potassium: 3.7 mmol/L (ref 3.5–5.1)
Sodium: 134 mmol/L — ABNORMAL LOW (ref 135–145)

## 2019-06-17 LAB — GLUCOSE, CAPILLARY
Glucose-Capillary: 140 mg/dL — ABNORMAL HIGH (ref 70–99)
Glucose-Capillary: 201 mg/dL — ABNORMAL HIGH (ref 70–99)

## 2019-06-17 LAB — CBC
HCT: 27.1 % — ABNORMAL LOW (ref 39.0–52.0)
Hemoglobin: 9 g/dL — ABNORMAL LOW (ref 13.0–17.0)
MCH: 29.4 pg (ref 26.0–34.0)
MCHC: 33.2 g/dL (ref 30.0–36.0)
MCV: 88.6 fL (ref 80.0–100.0)
Platelets: 294 10*3/uL (ref 150–400)
RBC: 3.06 MIL/uL — ABNORMAL LOW (ref 4.22–5.81)
RDW: 13.1 % (ref 11.5–15.5)
WBC: 9.5 10*3/uL (ref 4.0–10.5)
nRBC: 0 % (ref 0.0–0.2)

## 2019-06-17 MED ORDER — AMLODIPINE BESYLATE 10 MG PO TABS: 10.0000 mg | ORAL_TABLET | Freq: Every day | ORAL | 2 refills | Status: DC

## 2019-06-17 MED ORDER — HYDRALAZINE HCL 25 MG PO TABS: 25.0000 mg | ORAL_TABLET | Freq: Three times a day (TID) | ORAL | 1 refills | Status: DC

## 2019-06-17 MED ORDER — AMLODIPINE BESYLATE 10 MG PO TABS
10.0000 mg | ORAL_TABLET | Freq: Every day | ORAL | Status: DC
  Administered 2019-06-17: 10 mg via ORAL
  Filled 2019-06-17: qty 1

## 2019-06-17 MED ORDER — CARVEDILOL 12.5 MG PO TABS: 25.0000 mg | ORAL_TABLET | Freq: Two times a day (BID) | ORAL | Status: DC

## 2019-06-17 MED ORDER — HYDRALAZINE HCL 25 MG PO TABS: 25.0000 mg | ORAL_TABLET | Freq: Three times a day (TID) | ORAL | Status: DC

## 2019-06-17 NOTE — TOC Initial Note (Signed)
Transition of Care [facility]) - Initial/Assessment Note    Patient Details  Name: Christopher Davila MRN: [id number] Date of Birth: 11-Dec-1970  Transition of Care [facility]) CM/SW Contact:    Pollie Friar, RN Phone Number: 06/17/2019, 11:24 AM  Clinical Narrative:                 Pt lives with a friend that he says can provide minimal supervision. No DME at home.  Pt uses either the bus or walks for transportation. Pt goes to [facility] and sees Bevely Palmer for PcP. He will need med assist at d/c then will need to f/u with Bevely Palmer. Awaiting PT/OT evals.  TOC following.  Expected Discharge Plan: Home/Self Care Barriers to Discharge: Inadequate or no insurance, Barriers Unresolved (comment)   Patient Goals and CMS Choice        Expected Discharge Plan and Services Expected Discharge Plan: Home/Self Care   Discharge Planning Services: CM Consult   Living arrangements for the past 2 months: Apartment                                      Prior Living Arrangements/Services Living arrangements for the past 2 months: Apartment Lives with:: Roommate Patient language and need for interpreter reviewed:: Yes Do you feel safe going back to the place where you live?: Yes        Care giver support system in place?: No (comment)   Criminal Activity/Legal Involvement Pertinent to Current Situation/Hospitalization: No - Comment as needed  Activities of Daily Living Home Assistive Devices/Equipment: None ADL Screening (condition at time of admission) Patient's cognitive ability adequate to safely complete daily activities?: Yes Is the patient deaf or have difficulty hearing?: No Does the patient have difficulty seeing, even when wearing glasses/contacts?: Yes Does the patient have difficulty concentrating, remembering, or making decisions?: No Patient able to express need for assistance with ADLs?: No Does the patient have difficulty dressing or bathing?: No Independently performs ADLs?: Yes  (appropriate for developmental age) Does the patient have difficulty walking or climbing stairs?: No Weakness of Legs: None Weakness of Arms/Hands: None  Permission Sought/Granted                  Emotional Assessment Appearance:: Appears stated age Attitude/Demeanor/Rapport: Engaged Affect (typically observed): Accepting Orientation: : Oriented to Self, Oriented to Place, Oriented to  Time, Oriented to Situation   Psych Involvement: No (comment)  Admission diagnosis:  Hypertensive emergency [I16.1] Visual disturbances [H53.9] Acute loss of vision, right [H53.131] Patient Active Problem List   Diagnosis Date Noted  . Acute loss of vision, right 06/16/2019  . Hypertensive urgency 04/17/2019  . Hypokalemia 04/17/2019  . Normocytic anemia 04/17/2019  . Heart failure ([place]) 02/13/2019  . Hyperglycemia   . CKD (chronic kidney disease) stage 3, GFR 30-59 ml/min   . Alcohol use   . Gastroparesis due to DM ([place])   . Essential hypertension   . Hyperlipidemia   . Diabetes mellitus type 2 with complications, uncontrolled ([place]) 06/26/2012  . Hypertension 06/26/2012   PCP:  Marliss Coots, NP Pharmacy:   [place], [place] - [address] [place] [address] Phone: [phone] Fax: [phone]  [place], [place] - [address] [place] [address] Phone: [phone] Fax: [phone]  Social Determinants of Health (SDOH) Interventions    Readmission Risk Interventions Readmission Risk Prevention Plan 02/19/2019  Transportation Screening Complete  PCP or Specialist Appt within 5-7 Days Complete  Home Care Screening Complete  Medication Review (RN CM) Complete  Some recent data might be hidden

## 2019-06-17 NOTE — Plan of Care (Signed)
  Problem: Activity: Goal: Risk for activity intolerance will decrease Outcome: Progressing   Problem: Activity: Goal: Risk for activity intolerance will decrease Outcome: Progressing   

## 2019-06-17 NOTE — Progress Notes (Signed)
Subjective:    Christopher Davila reports his right eye blindness has not changed since yesterday at all. He continues to have tenderness to the periorbital region. Prior to acute worsening in symptoms 2 days ago, he denies any triggers including missing medication. He has never been worked up by an eye doctor in the past.   He denies any fever, chills, nausea, CP, SOB.   Objective:  Vital signs in last 24 hours: Vitals:   06/16/19 2010 06/16/19 2359 06/17/19 0414 06/17/19 0817  BP: (!) 167/108 (!) 159/101 (!) 156/117 (!) 165/100  Pulse: (!) 104 (!) 104 (!) 103 (!) 101  Resp: 19 18 19 20   Temp: 98.6 F (37 C) 98.7 F (37.1 C) 98.5 F (36.9 C) 98.7 F (37.1 C)  TempSrc: Oral Oral Oral Oral  SpO2: 100% 100% 100% 100%    Physical Exam Vitals and nursing note reviewed.  Constitutional:      General: He is not in acute distress. Eyes:     General: Visual field deficit present.        Right eye: Discharge present.        Left eye: No discharge.     Conjunctiva/sclera:     Right eye: Right conjunctiva is injected.     Left eye: Left conjunctiva is not injected.     Pupils:     Right eye: Pupil is sluggish.     Left eye: Pupil is not sluggish.     Comments: Unable to see light or objects with right eye. Tenderness to palpation in peri-orbital region.   Cardiovascular:     Rate and Rhythm: Normal rate and regular rhythm.  Pulmonary:     Effort: Pulmonary effort is normal. No respiratory distress.  Skin:    General: Skin is warm and dry.  Neurological:     General: No focal deficit present.     Mental Status: He is alert and oriented to person, place, and time. Mental status is at baseline.  Psychiatric:        Mood and Affect: Mood normal.        Behavior: Behavior normal.    Assessment/Plan:  Active Problems:   Diabetes mellitus type 2 with complications, uncontrolled (HCC)   Hypertension   Heart failure (HCC)   Hypertensive urgency   Hypokalemia   Normocytic anemia   Acute loss of vision, right  Christopher Davila is a 48 y/o male with a PMHx of poorly controlled HTN, CVA as seen on imaging, T2DM, CKD, HFpEF who presented to the ED with c/o right vision loss and admitted for hypertensive urgency.  # Right vision loss  DDx includes progressive HTN retinopathy vs DM retinopathy vs acute closed angle glaucoma. Neurology felt this could be due to hypertensive emergency but recommended an MRI. MRI was negative for any acute changes. No improvement with improved BP. Contacted Dr. Valetta Close, ophthalmology, who recommend outpatient follow up today and is able to see the patient at 2:45pm for immediate evaluation.   - Outpatient referral to opthalmology   # Hypertensive Urgency  At home, patient is prescribed Lasix, Carvedilol and Hydralazine that he reports taking as prescribed with the exception of missing 1-2 doses maximum per week. On admission, BP was very elevated with systolic in the [id number], but has improved slightly. Initially, BP medications were held till stroke could be ruled out. MRI negative so will restart home Coreg, increase dose of Hydralazine and add Amlodipine, as he was previously on this medication.  Discharged  with: Amlodipine 10 mg, home dose Coreg and Hydralazine 25 mg TID   # Diabetes Mellitus  Home medications include Glipizide and Januvia but has not started Januvia yet. A1c this admission is 7.6%.    Dispo: Anticipated discharge today Dr. Jose Persia Internal Medicine PGY-1  Pager: [phone] 06/17/2019, 9:17 AM

## 2019-06-17 NOTE — Progress Notes (Signed)
Nutrition Brief Note  Patient identified on the Malnutrition Screening Tool (MST) Report  Wt Readings from Last 15 Encounters:  04/16/19 61.2 kg  03/22/19 63.5 kg  02/20/19 67.5 kg  06/18/18 68 kg  05/12/18 63.5 kg  02/27/18 64 kg  06/06/16 68 kg  11/28/15 68 kg  05/24/15 65.2 kg  12/13/14 68 kg  09/07/14 68 kg  03/24/14 67.1 kg  02/15/14 67.2 kg  10/07/13 68 kg  04/26/13 62.3 kg    Current diet order is heart healthy/carbohydrate modified diet, patient is consuming approximately 100% of meals at this time. Pt reports having a good appetite with usual consumption of at least 2-3 meals a day with no difficulties. Recent weight loss likely related to fluid status. Pt takes lasix at home as pt with history of HFpEF. Labs and medications reviewed. Pt currently has Ensure ordered and would like to continue with them.  No further nutrition interventions warranted at this time. If nutrition issues arise, please consult RD.   Corrin Parker, MS, RD, LDN Pager # [phone] After hours/ weekend pager # [phone]

## 2019-06-17 NOTE — Discharge Summary (Signed)
Name: Christopher Davila MRN: [id number] DOB: 01-04-1971 48 y.o. PCP: Marliss Coots, NP  Date of Admission: 06/15/2019  4:18 PM Date of Discharge: 06/17/2019 Attending Physician: Lucious Groves, DO  Discharge Diagnosis: 1. Right Vision Loss 2. Hypertensive Urgency   Discharge Medications: Allergies as of 06/17/2019      Reactions   Morphine And Related Itching, Other (See Comments)   Pt prefers not to be given this drug      Medication List    TAKE these medications   amLODipine 10 MG tablet Commonly known as: NORVASC Take 1 tablet (10 mg total) by mouth daily. Notes to patient: 06/18/2019   aspirin EC 81 MG tablet Take 1 tablet (81 mg total) by mouth daily. Notes to patient: 06/18/2019   atorvastatin 80 MG tablet Commonly known as: Lipitor Take 1 tablet (80 mg total) by mouth daily.   carvedilol 25 MG tablet Commonly known as: COREG Take 1 tablet (25 mg total) by mouth 2 (two) times daily with a meal.   gabapentin 300 MG capsule Commonly known as: NEURONTIN Take 1 capsule (300 mg total) by mouth 3 (three) times daily.   glipiZIDE 5 MG 24 hr tablet Commonly known as: GLUCOTROL XL Take 1 tablet (5 mg total) by mouth daily with breakfast.   hydrALAZINE 25 MG tablet Commonly known as: APRESOLINE Take 1 tablet (25 mg total) by mouth 3 (three) times daily. What changed: Another medication with the same name was removed. Continue taking this medication, and follow the directions you see here.   potassium chloride SA 20 MEQ tablet Commonly known as: KLOR-CON Take 2 tablets (40 mEq total) by mouth daily. What changed:   when to take this  reasons to take this   sitaGLIPtin 50 MG tablet Commonly known as: Januvia Take 1 tablet (50 mg total) by mouth daily.       Disposition and follow-up:   Mr.Ariyan Cortez was discharged from [facility] in Good condition.  At the hospital follow up visit please address:  1.   BP medication may benefit  from additional optimization.   Ensure ophthalmology follow up was successful   PT evaluation during admission recommended outpatient PT therapy. Please help patient set this up.   2.  Labs / imaging needed at time of follow-up: BMP, CBC  3.  Pending labs/ test needing follow-up: None   Follow-up Appointments: Follow-up Information    Placey, Audrea Muscat, NP. Schedule an appointment as soon as possible for a visit in 1 week(s).   Contact information: [place] [address] [phone]        Jola Schmidt, MD. Go on 06/17/2019.   Specialty: Ophthalmology Why: APPOINTMENT TODAY. PLEASE BE THERE BY 2:45.  Contact information: [place] [address] [phone]           Hospital Course by problem list: 1. Right Vision Loss:  Patient presented to the ED with complaints of sudden right vision loss after several month history of slowly diminishing right sided vision.  Ophthalmology was curb sided who did not feel emergent evaluation was necessary.  They felt this is likely progressive hypertensive versus diabetic retinopathy.  Neurology was also consulted who recommended an MRI to rule out acute stroke. MRI did not show any acute intracranial abnormalities but did show extensive prior chronic small vessel ischemic disease and cerebral atrophy.  No abnormalities consistent with causing right eye vision loss.  Ophthalmology was recontacted who recommended  immediate discharge so he can follow-up with their office on the same day.  Mr. Bester agreed to this and was instructed to follow up with Dr. Valetta Close.   2. Hypertensive Urgency  Initial blood pressure on presentation was 191/121.  Initially pain had progressive hypertension while stroke rule out proceeded.  Once stroke was successfully ruled out home blood pressure medications were reinitiated, which included Coreg and hydralazine.  Per chart review, patient was previously also on amlodipine, which was  restarted at this time given inadequate control.  Recommend outpatient follow-up with blood pressure and appropriate adjustments to medications.  Discharge Vitals:   BP (!) 188/120   Pulse (!) 109   Temp 97.6 F (36.4 C) (Oral)   Resp 20   SpO2 100%   Pertinent Labs, Studies, and Procedures:   CBC Latest Ref Rng & Units 06/17/2019 06/15/2019 04/18/2019  WBC 4.0 - 10.5 K/uL 9.5 8.6 7.7  Hemoglobin 13.0 - 17.0 g/dL 9.0(L) 10.8(L) 9.8(L)  Hematocrit 39.0 - 52.0 % 27.1(L) 32.2(L) 28.1(L)  Platelets 150 - 400 K/uL 294 325 220   BMP Latest Ref Rng & Units 06/17/2019 06/15/2019 04/18/2019  Glucose 70 - 99 mg/dL 196(H) 264(H) 398(H)  BUN 6 - 20 mg/dL 29(H) 24(H) 24(H)  Creatinine 0.61 - 1.24 mg/dL 3.08(H) 3.07(H) 2.85(H)  Sodium 135 - 145 mmol/L 134(L) 135 130(L)  Potassium 3.5 - 5.1 mmol/L 3.7 3.1(L) 3.2(L)  Chloride 98 - 111 mmol/L 104 102 100  CO2 22 - 32 mmol/L 19(L) 21(L) 20(L)  Calcium 8.9 - 10.3 mg/dL 8.0(L) 8.5(L) 8.4(L)   MRI Brain W/O Contrast: 06/16/2019 IMPRESSION: 1. Motion degraded, incomplete examination. 2. No acute infarct. 3. Chronic hemorrhagic left basal ganglia infarct. 4. Advanced chronic small vessel ischemic disease and cerebral atrophy.  Discharge Instructions: Discharge Instructions    Call MD for:  difficulty breathing, headache or visual disturbances   Complete by: As directed    Call MD for:  persistant dizziness or light-headedness   Complete by: As directed    Call MD for:  redness, tenderness, or signs of infection (pain, swelling, redness, odor or green/yellow discharge around incision site)   Complete by: As directed    Call MD for:  severe uncontrolled pain   Complete by: As directed    Call MD for:  temperature >100.4   Complete by: As directed    Diet - low sodium heart healthy   Complete by: As directed    Discharge instructions   Complete by: As directed    Thank you for allowing [place] to care for you during your hospital stay.   You  were admitted for very high blood pressure, as well as right eye blindness.   For the blood pressure, we made some medication changes:  - Continue Carvedilol (Coreg) as previously prescribed  - Increased dose of Hydralazine to 25 mg three times per day - Added Amlodipine 10 mg once per day.   Please make sure to pick up these new medications. Uncontrolled hypertension can led to severe eye problems.   For your right eye blindness, we made an appointment at the eye doctor. His name is Dr. Valetta Close. Please be there by the absolute latest 2:45 PM today!   Address:  [place], [place]   Phone number: [phone]  If you cannot make the appointment, call the office immediately.   Increase activity slowly   Complete by: As directed       Signed:  Dr. Jose Persia  Internal Medicine PGY-1  Pager: [phone] 06/17/2019, 1:51 PM

## 2019-06-17 NOTE — TOC Transition Note (Addendum)
Transition of Care [facility]) - CM/SW Discharge Note   Patient Details  Name: Christopher Davila MRN: [id number] Date of Birth: 1971/04/11  Transition of Care [facility]) CM/SW Contact:  Pollie Friar, RN Phone Number: 06/17/2019, 2:00 PM   Clinical Narrative:    Pt discharging to home via the Eye MD. CM provided cab voucher to get him to eye appt at 2;30. Pt to f/u with [facility] on Monday to get him [facility]. Pt states he has the meds ordered at home to last until Monday.  Outpatient therapy arranged through [facility] and information on his AVS.   Final next level of care: OP Rehab Barriers to Discharge: Inadequate or no insurance, Barriers Unresolved (comment)   Patient Goals and CMS Choice        Discharge Placement                       Discharge Plan and Services   Discharge Planning Services: CM Consult                                 Social Determinants of Health (SDOH) Interventions     Readmission Risk Interventions Readmission Risk Prevention Plan 02/19/2019  Transportation Screening Complete  PCP or Specialist Appt within 5-7 Days Complete  Home Care Screening Complete  Medication Review (RN CM) Complete  Some recent data might be hidden

## 2019-06-17 NOTE — Evaluation (Addendum)
Physical Therapy Evaluation Patient Details Name: Christopher Davila MRN: [id number] DOB: [birth year] Today's Date: 06/17/2019   History of Present Illness  Mr Wilbers is a 48 yo M with Hx of DM, HTN, HFpEF, CKD, and HLD who presented with worsening of ongoing vision loss, unsteadiness and hypertensive urgency. Opthalmology was consulted and believe his issues are related to chronic hypertensive and diabetic retinopathy. MRI negative for acute infarct.  Clinical Impression  Pt admitted with above. Pt presents with decreased functional mobility secondary to right visual field impairment, balance deficits, and decreased proprioception. Pt ambulating hallway distances without an assistive device at a supervision level; able to negotiate a few steps to simulate home set up. BP 177/119 post mobility; RN notified. Pt requiring cues for locating room numbers/signage on right side of hallway. Instructed on compensatory technique of turning head for scanning environment. Will likely need assist with IADL's at home in light of deficits including medication management. Will benefit from OPPT to address deficits and maximize functional independence.     Follow Up Recommendations Outpatient PT    Equipment Recommendations  None recommended by PT    Recommendations for Other Services       Precautions / Restrictions Precautions Precautions: Fall;Other (comment) Precaution Comments: right visual field cut Restrictions Weight Bearing Restrictions: No      Mobility  Bed Mobility Overal bed mobility: Independent                Transfers Overall transfer level: Independent Equipment used: None                Ambulation/Gait Ambulation/Gait assistance: Supervision Gait Distance (Feet): 350 Feet Assistive device: None Gait Pattern/deviations: Step-through pattern;Decreased dorsiflexion - right;Decreased dorsiflexion - left;Wide base of support Gait velocity: decreased   General Gait  Details: Pt with dynamic instability but no overt LOB. has difficulty at times controlling momentum, lateral drift  Stairs Stairs: Yes Stairs assistance: Supervision Stair Management: Two rails Number of Stairs: 2    Wheelchair Mobility    Modified Rankin (Stroke Patients Only)       Balance Overall balance assessment: Needs assistance Sitting-balance support: Feet supported Sitting balance-Leahy Scale: Good     Standing balance support: No upper extremity supported;During functional activity Standing balance-Leahy Scale: Fair                               Pertinent Vitals/Pain Pain Assessment: Faces Faces Pain Scale: Hurts little more Pain Location: right eye Pain Descriptors / Indicators: Sore Pain Intervention(s): Monitored during session    Home Living Family/patient expects to be discharged to:: Private residence Living Arrangements: Non-relatives/Friends Available Help at Discharge: Family Type of Home: Apartment Home Access: Stairs to enter   Technical brewer of Steps: 3 Home Layout: One level Home Equipment: None      Prior Function Level of Independence: Independent         Comments: Works as Biomedical scientist at [employer]  but reports he has been unable to the past 1.5 weeks     Hand Dominance        Extremity/Trunk Assessment   Upper Extremity Assessment Upper Extremity Assessment: Overall WFL for tasks assessed    Lower Extremity Assessment Lower Extremity Assessment: RLE deficits/detail;LLE deficits/detail RLE Sensation: decreased proprioception LLE Sensation: decreased proprioception       Communication   Communication: No difficulties  Cognition Arousal/Alertness: Awake/alert Behavior During Therapy: Flat affect Overall Cognitive Status: Within Functional Limits for  tasks assessed                                 General Comments: WFL for mobility; higher level cognition not assessed      General  Comments      Exercises     Assessment/Plan    PT Assessment Patient needs continued PT services  PT Problem List Decreased balance;Decreased mobility;Decreased coordination;Impaired sensation;Pain       PT Treatment Interventions DME instruction;Gait training;Stair training;Functional mobility training;Therapeutic activities;Therapeutic exercise;Balance training;Patient/family education    PT Goals (Current goals can be found in the Care Plan section)  Acute Rehab PT Goals Patient Stated Goal: to see better PT Goal Formulation: With patient Time For Goal Achievement: 07/01/19 Potential to Achieve Goals: Good    Frequency Min 3X/week   Barriers to discharge        Co-evaluation               AM-PAC PT "6 Clicks" Mobility  Outcome Measure Help needed turning from your back to your side while in a flat bed without using bedrails?: None Help needed moving from lying on your back to sitting on the side of a flat bed without using bedrails?: None Help needed moving to and from a bed to a chair (including a wheelchair)?: None Help needed standing up from a chair using your arms (e.g., wheelchair or bedside chair)?: None Help needed to walk in hospital room?: None Help needed climbing 3-5 steps with a railing? : None 6 Click Score: 24    End of Session Equipment Utilized During Treatment: Gait belt Activity Tolerance: Patient tolerated treatment well Patient left: in bed;with call bell/phone within reach Nurse Communication: Mobility status;Other (comment)(elevated BP) PT Visit Diagnosis: Unsteadiness on feet (R26.81);Other abnormalities of gait and mobility (R26.89);Pain;Difficulty in walking, not elsewhere classified (R26.2) Pain - Right/Left: Right Pain - part of body: (eye)    Time: [id number] PT Time Calculation (min) (ACUTE ONLY): 16 min   Charges:   PT Evaluation $PT Eval Moderate Complexity: 1 Mod          Ellamae Sia, [place], DPT Acute Rehabilitation  Services Pager [phone] Office [phone]   Willy Eddy 06/17/2019, 12:19 PM

## 2019-06-17 NOTE — Plan of Care (Signed)
  Problem: Activity: Goal: Risk for activity intolerance will decrease Outcome: Progressing   Problem: Nutrition: Goal: Adequate nutrition will be maintained Outcome: Progressing   

## 2019-07-07 ENCOUNTER — Other Ambulatory Visit: Payer: Self-pay

## 2019-07-07 ENCOUNTER — Encounter (HOSPITAL_COMMUNITY): Payer: Self-pay | Admitting: Emergency Medicine

## 2019-07-07 ENCOUNTER — Emergency Department (HOSPITAL_COMMUNITY)
Admission: EM | Admit: 2019-07-07 | Discharge: 2019-07-07 | Disposition: A | Discharge status: Home / Self Care | Payer: Self-pay | Attending: Emergency Medicine | Admitting: Emergency Medicine

## 2019-07-07 DIAGNOSIS — E1122 Type 2 diabetes mellitus with diabetic chronic kidney disease: Secondary | ICD-10-CM | POA: Insufficient documentation

## 2019-07-07 DIAGNOSIS — H5461 Unqualified visual loss, right eye, normal vision left eye: Secondary | ICD-10-CM

## 2019-07-07 DIAGNOSIS — R269 Unspecified abnormalities of gait and mobility: Secondary | ICD-10-CM

## 2019-07-07 DIAGNOSIS — R110 Nausea: Secondary | ICD-10-CM | POA: Insufficient documentation

## 2019-07-07 DIAGNOSIS — H53131 Sudden visual loss, right eye: Secondary | ICD-10-CM | POA: Insufficient documentation

## 2019-07-07 DIAGNOSIS — I129 Hypertensive chronic kidney disease with stage 1 through stage 4 chronic kidney disease, or unspecified chronic kidney disease: Secondary | ICD-10-CM | POA: Insufficient documentation

## 2019-07-07 DIAGNOSIS — R1013 Epigastric pain: Secondary | ICD-10-CM | POA: Insufficient documentation

## 2019-07-07 DIAGNOSIS — N183 Chronic kidney disease, stage 3 unspecified: Secondary | ICD-10-CM | POA: Insufficient documentation

## 2019-07-07 DIAGNOSIS — Z7984 Long term (current) use of oral hypoglycemic drugs: Secondary | ICD-10-CM | POA: Insufficient documentation

## 2019-07-07 LAB — CBC
HCT: 33 % — ABNORMAL LOW (ref 39.0–52.0)
Hemoglobin: 11 g/dL — ABNORMAL LOW (ref 13.0–17.0)
MCH: 29.1 pg (ref 26.0–34.0)
MCHC: 33.3 g/dL (ref 30.0–36.0)
MCV: 87.3 fL (ref 80.0–100.0)
Platelets: 260 10*3/uL (ref 150–400)
RBC: 3.78 MIL/uL — ABNORMAL LOW (ref 4.22–5.81)
RDW: 12.7 % (ref 11.5–15.5)
WBC: 9.6 10*3/uL (ref 4.0–10.5)
nRBC: 0 % (ref 0.0–0.2)

## 2019-07-07 LAB — COMPREHENSIVE METABOLIC PANEL
ALT: 12 U/L (ref 0–44)
AST: 21 U/L (ref 15–41)
Albumin: 2.3 g/dL — ABNORMAL LOW (ref 3.5–5.0)
Alkaline Phosphatase: 84 U/L (ref 38–126)
Anion gap: 11 (ref 5–15)
BUN: 27 mg/dL — ABNORMAL HIGH (ref 6–20)
CO2: 22 mmol/L (ref 22–32)
Calcium: 8.4 mg/dL — ABNORMAL LOW (ref 8.9–10.3)
Chloride: 105 mmol/L (ref 98–111)
Creatinine, Ser: 2.84 mg/dL — ABNORMAL HIGH (ref 0.61–1.24)
GFR calc Af Amer: 29 mL/min — ABNORMAL LOW (ref >60)
GFR calc non Af Amer: 25 mL/min — ABNORMAL LOW (ref >60)
Glucose, Bld: 165 mg/dL — ABNORMAL HIGH (ref 70–99)
Potassium: 3.1 mmol/L — ABNORMAL LOW (ref 3.5–5.1)
Sodium: 138 mmol/L (ref 135–145)
Total Bilirubin: 0.4 mg/dL (ref 0.3–1.2)
Total Protein: 6.2 g/dL — ABNORMAL LOW (ref 6.5–8.1)

## 2019-07-07 LAB — LIPASE, BLOOD: Lipase: 27 U/L (ref 11–51)

## 2019-07-07 MED ORDER — TETRACAINE HCL 0.5 % OP SOLN
1.0000 [drp] | Freq: Once | OPHTHALMIC | Status: AC
  Administered 2019-07-07: 1 [drp] via OPHTHALMIC
  Filled 2019-07-07: qty 4

## 2019-07-07 MED ORDER — FLUORESCEIN SODIUM 1 MG OP STRP
1.0000 | ORAL_STRIP | Freq: Once | OPHTHALMIC | Status: AC
  Administered 2019-07-07: 1 via OPHTHALMIC
  Filled 2019-07-07: qty 1

## 2019-07-07 MED ORDER — SODIUM CHLORIDE 0.9% FLUSH: 3.0000 mL | Freq: Once | INTRAVENOUS | Status: DC

## 2019-07-07 NOTE — ED Notes (Signed)
Not able to read anything out of the right eye for visual acuity

## 2019-07-07 NOTE — Discharge Instructions (Signed)
As we discussed, your vision loss is most likely continuation of what you are experiencing in December.  You will need to follow-up with referred ophthalmology Dr. Carlene Coria.  Additionally, we suspect that your difficulty walking/dizziness is from the continued neuropathy that you have as well as retinopathy in your eyes.  Please follow-up with referred neurology doctor further evaluation of your symptoms.  Return to the emergency department for any chest pain, difficulty breathing, fevers, vomiting or any other worsening concerning symptoms.

## 2019-07-07 NOTE — ED Provider Notes (Signed)
[facility] EMERGENCY DEPARTMENT Provider Note   CSN: [id number] Arrival date & time: 07/07/19  1307     History Chief Complaint  Patient presents with  . Abdominal Pain  . Dizziness  . Eye Problem    Christopher Davila is a 49 y.o. male past medical history of diabetes, dyspnea, gastroparesis, hypertension who presents for evaluation of worsening of vision changes, nausea, dizziness, balance issues.  Patient was seen in December for evaluation of the symptoms.  At this time, it was thought to be due to hypertensive emergency.  His MRI was unremarkable.  He was discharged home and was supposed to follow-up with ophthalmology which he states he did.  He states that in December, his vision in his right eye was bad but he could not believe outside images.  He feels like since then, his changes are completely black and he has no vision.  He also feels like his off-balance sensation is getting worse and he feels like he is having difficulty walking secondary to it.  He also reports nausea that has been ongoing for the last 3 days as well as some upper abdominal pain.  He has not had any vomiting.  He denies any fevers, chest pain, difficulty breathing, numbness/weakness of his arms or legs.  The history is provided by the patient.       Past Medical History:  Diagnosis Date  . Abdominal pain 12/28/2012  . Acute kidney injury superimposed on chronic kidney disease ([place])   . Chest pain 02/14/2014  . Diabetes mellitus   . Dyspnea   . Epigastric abdominal pain   . Gastroparesis due to DM ([place])   . Hypertension   . Hypoalbuminemia 04/17/2019  . Hypoxia   . Nausea   . Neuropathy of lower extremity   . Oral candidiasis 06/26/2012  . Pancreatitis 06/2012  . Pancreatitis 06/26/2012  . Weight loss 06/26/2012  . Wheezing     Patient Active Problem List   Diagnosis Date Noted  . Acute loss of vision, right 06/16/2019  . Hypertensive urgency 04/17/2019  . Hypokalemia 04/17/2019    . Normocytic anemia 04/17/2019  . Heart failure ([place]) 02/13/2019  . Hyperglycemia   . CKD (chronic kidney disease) stage 3, GFR 30-59 ml/min   . Alcohol use   . Gastroparesis due to DM ([place])   . Essential hypertension   . Hyperlipidemia   . Diabetes mellitus type 2 with complications, uncontrolled ([place]) 06/26/2012  . Hypertension 06/26/2012    History reviewed. No pertinent surgical history.     Family History  Problem Relation Age of Onset  . Diabetes Mother   . Lung disease Mother   . Hypertension Mother   . Diabetes Maternal Aunt   . CAD Maternal Aunt   . CAD Cousin     Social History   Tobacco Use  . Smoking status: Former Smoker    Packs/day: 0.00    Types: Cigarettes    Quit date: 03/22/2015    Years since quitting: 4.2  . Smokeless tobacco: Never Used  Substance Use Topics  . Alcohol use: Not Currently  . Drug use: No    Home Medications Prior to Admission medications   Medication Sig Start Date End Date Taking? Authorizing Provider  amLODipine (NORVASC) 10 MG tablet Take 1 tablet (10 mg total) by mouth daily. 06/17/19  Yes Jose Persia, MD  aspirin EC 81 MG tablet Take 1 tablet (81 mg total) by mouth daily. 05/24/15  Yes Ghimire, Dispensing optician  M, MD  atorvastatin (LIPITOR) 80 MG tablet Take 1 tablet (80 mg total) by mouth daily. 02/19/19  Yes Gladys Damme, MD  carvedilol (COREG) 25 MG tablet Take 1 tablet (25 mg total) by mouth 2 (two) times daily with a meal. 02/19/19  Yes Gladys Damme, MD  gabapentin (NEURONTIN) 300 MG capsule Take 1 capsule (300 mg total) by mouth 3 (three) times daily. 02/19/19  Yes Gladys Damme, MD  glipiZIDE (GLUCOTROL XL) 5 MG 24 hr tablet Take 1 tablet (5 mg total) by mouth daily with breakfast. 04/18/19 07/07/19 Yes Pahwani, Einar Grad, MD  hydrALAZINE (APRESOLINE) 25 MG tablet Take 1 tablet (25 mg total) by mouth 3 (three) times daily. 06/17/19 08/16/19 Yes Jose Persia, MD  potassium chloride SA (KLOR-CON) 20 MEQ tablet Take 2 tablets  (40 mEq total) by mouth daily. Patient taking differently: Take 40 mEq by mouth daily as needed.  04/18/19 07/07/19 Yes Pahwani, Einar Grad, MD  sitaGLIPtin (JANUVIA) 50 MG tablet Take 1 tablet (50 mg total) by mouth daily. Patient not taking: Reported on 07/07/2019 04/18/19 05/18/19  Darliss Cheney, MD    Allergies    Morphine and related  Review of Systems   Review of Systems  Constitutional: Negative for fever.  Eyes: Positive for visual disturbance.  Respiratory: Negative for cough and shortness of breath.   Cardiovascular: Negative for chest pain.  Gastrointestinal: Positive for abdominal pain and nausea. Negative for vomiting.  Genitourinary: Negative for dysuria and hematuria.  Musculoskeletal: Positive for gait problem.  Neurological: Positive for dizziness. Negative for headaches.  All other systems reviewed and are negative.   Physical Exam Updated Vital Signs BP (!) 157/85 (BP Location: Right Arm)   Pulse 84   Temp 98.2 F (36.8 C) (Oral)   Resp 16   SpO2 97%   Physical Exam Vitals and nursing note reviewed.  Constitutional:      Appearance: Normal appearance. He is well-developed.  HENT:     Head: Normocephalic and atraumatic.  Eyes:     General: Lids are normal.     Conjunctiva/sclera: Conjunctivae normal.     Pupils: Pupils are equal, round, and reactive to light.     Comments: Right eye is fixed and dilated. Left pupil is 3 mm and reactive.  He is not able to tell me how any fingers I am holding up when assessing her right eye.  Left eye he is able to correctly tell me how many fingers.  Cardiovascular:     Rate and Rhythm: Normal rate and regular rhythm.     Pulses: Normal pulses.     Heart sounds: Normal heart sounds. No murmur. No friction rub. No gallop.   Pulmonary:     Effort: Pulmonary effort is normal.     Breath sounds: Normal breath sounds.  Abdominal:     Palpations: Abdomen is soft. Abdomen is not rigid.     Tenderness: There is abdominal tenderness  in the epigastric area. There is no guarding.     Comments: Abdomen soft, nondistended.  Mild tenderness palpation in the epigastric area.  No rigidity, guarding.  Musculoskeletal:        General: Normal range of motion.     Cervical back: Full passive range of motion without pain.  Skin:    General: Skin is warm and dry.     Capillary Refill: Capillary refill takes less than 2 seconds.  Neurological:     Mental Status: He is alert and oriented to person, place, and time.  Comments: Cranial nerves III-XII intact Follows commands, Moves all extremities  5/5 strength to BUE and BLE  Sensation intact throughout all major nerve distributions Normal finger to nose. No pronator drift. Ataxic gait noted.  No slurred speech. No facial droop.   Psychiatric:        Speech: Speech normal.     ED Results / Procedures / Treatments   Labs (all labs ordered are listed, but only abnormal results are displayed) Labs Reviewed  COMPREHENSIVE METABOLIC PANEL - Abnormal; Notable for the following components:      Result Value   Potassium 3.1 (*)    Glucose, Bld 165 (*)    BUN 27 (*)    Creatinine, Ser 2.84 (*)    Calcium 8.4 (*)    Total Protein 6.2 (*)    Albumin 2.3 (*)    GFR calc non Af Amer 25 (*)    GFR calc Af Amer 29 (*)    All other components within normal limits  CBC - Abnormal; Notable for the following components:   RBC 3.78 (*)    Hemoglobin 11.0 (*)    HCT 33.0 (*)    All other components within normal limits  LIPASE, BLOOD  URINALYSIS, ROUTINE W REFLEX MICROSCOPIC    EKG EKG Interpretation  Date/Time:  Wednesday July 07 2019 13:15:52 EST Ventricular Rate:  104 PR Interval:  144 QRS Duration: 82 QT Interval:  374 QTC Calculation: 491 R Axis:   59 Text Interpretation: Sinus tachycardia Left ventricular hypertrophy with repolarization abnormality ( Sokolow-Lyon ) Abnormal ECG Confirmed by Madalyn Rob [phone]) on 07/07/2019 1:24:51 PM   Radiology No  results found.  Procedures Procedures (including critical care time)  Medications Ordered in ED Medications  sodium chloride flush (NS) 0.9 % injection 3 mL (3 mLs Intravenous Not Given 07/07/19 1409)  tetracaine (PONTOCAINE) 0.5 % ophthalmic solution 1 drop (1 drop Both Eyes Given by Other 07/07/19 1502)  fluorescein ophthalmic strip 1 strip (1 strip Both Eyes Given by Other 07/07/19 1503)    ED Course  I have reviewed the triage vital signs and the nursing notes.  Pertinent labs & imaging results that were available during my care of the patient were reviewed by me and considered in my medical decision making (see chart for details).    MDM Rules/Calculators/A&P                      49 year old male who presents for evaluation of vision changes, dizziness, difficulty walking, nausea, abdominal pain.  On initial ED arrival, he is slightly tachycardic, hypertensive but vitals otherwise stable.  Right eye is fixed and dilated.  Left eye is 3 mm and reactive.  He has no vision in his right eye and cannot correctly tell me how many fingers I am holding up.  He does have slight ataxic gait.  No other neuro deficits.  He has some mid upper abdominal tenderness.  Plan to check labs.  I discussed with him regarding his symptoms.  He states that this is all been continuous since December.  He states that he has not had no period of time since he was admitted on December where he has not had any symptoms.  He states that this is a continuation of what he had been experiencing in December but he feels like it is slightly worse.  I reviewed his visit from December which he was admitted for.  During that time, I did mention that he has  some black vision in that right eye as well as some complete vision loss.  He states that he was still able to make out images but states that over the last 2 and half weeks, he feels like it is worsened and gone to completely black vision.  Additionally, he had symptoms of  the same symptoms with dizziness and difficulty walking and had an MRI that was negative for any acute abnormalities.  Woods lamp revealed no evidence of corneal abrasion, corneal ulcer.  No evidence of fluorescein uptake.  Negative Seidel sign.  Intraocular pressure as documented below:  Left IOP: 10, 6  Right IOP: 12, 13   Discussed patient with Dr. Manuella Ghazi (Ophthalomology). No acute emergent intervention needed. He feels like this is most likely worsening diabetic retinopathy. He can follow up with him in the office tomorrow or he can follow up with [facility].   Discussed patient with Dr. Rory Percy (Neuro).  At this time, he feels that this is continued progression of his diabetic neuropathy as well as his vision changes.  Given that this is been continuous since December, does not need acute MRI at this time as his exam is not concerning for stroke.  He recommends outpatient follow-up with neurology.  Lipase is unremarkable.  CBC shows no leukocytosis.  Hemoglobin stable at 11.0.  CMP shows potassium of 3.1, BUN of 27, creatinine 2.84.  Previous records show that he has had creatinines ranging from 2.8-3.08.   I discussed with Dr. Maryan Rued.  She is agreeable to plan.  I discussed with patient.  At this time, he sitting comfortably.  He has not had any nausea or vomiting since being here in the emergency department.  I discussed with him that he needs to follow-up with outpatient ophthalmology and neurology. At this time, patient exhibits no emergent life-threatening condition that require further evaluation in ED or admission. Patient had ample opportunity for questions and discussion. All patient's questions were answered with full understanding. Strict return precautions discussed. Patient expresses understanding and agreement to plan.   Portions of this note were generated with Lobbyist. Dictation errors may occur despite best attempts at proofreading.  Final Clinical Impression(s) / ED  Diagnoses Final diagnoses:  Vision loss of right eye  Abnormal gait    Rx / DC Orders ED Discharge Orders    None       Desma Mcgregor 07/07/19 1944    Blanchie Dessert, MD 07/07/19 2046

## 2019-07-07 NOTE — ED Triage Notes (Signed)
Pt to triage via [facility] from home.  C/o difficulty seeing out of R eye x 3 months that is gradually getting worse.  States he can't see out of R eye at all now.  Reports mid upper abd pain and nausea x 3 days.  Also c/o dizziness. CBG 201.

## 2019-07-07 NOTE — ED Notes (Signed)
Patient verbalizes understanding of discharge instructions. Opportunity for questioning and answers were provided. Armband removed by staff, pt discharged from ED.    Gave patient bus pass.

## 2019-09-04 DIAGNOSIS — H547 Unspecified visual loss: Secondary | ICD-10-CM

## 2019-09-04 HISTORY — DX: Unspecified visual loss: H54.7

## 2019-09-19 ENCOUNTER — Inpatient Hospital Stay (HOSPITAL_COMMUNITY)
Admission: EM | Admit: 2019-09-19 | Discharge: 2019-09-24 | DRG: 125 | Disposition: A | Discharge status: Home / Self Care | Payer: Medicaid Other | Attending: Internal Medicine | Admitting: Internal Medicine

## 2019-09-19 ENCOUNTER — Other Ambulatory Visit: Payer: Self-pay

## 2019-09-19 ENCOUNTER — Encounter (HOSPITAL_COMMUNITY): Payer: Self-pay

## 2019-09-19 DIAGNOSIS — Z7982 Long term (current) use of aspirin: Secondary | ICD-10-CM

## 2019-09-19 DIAGNOSIS — Z87891 Personal history of nicotine dependence: Secondary | ICD-10-CM

## 2019-09-19 DIAGNOSIS — N1832 Chronic kidney disease, stage 3b: Secondary | ICD-10-CM | POA: Diagnosis present

## 2019-09-19 DIAGNOSIS — E1122 Type 2 diabetes mellitus with diabetic chronic kidney disease: Secondary | ICD-10-CM | POA: Diagnosis present

## 2019-09-19 DIAGNOSIS — I130 Hypertensive heart and chronic kidney disease with heart failure and stage 1 through stage 4 chronic kidney disease, or unspecified chronic kidney disease: Secondary | ICD-10-CM | POA: Diagnosis present

## 2019-09-19 DIAGNOSIS — Z5900 Homelessness unspecified: Secondary | ICD-10-CM

## 2019-09-19 DIAGNOSIS — E162 Hypoglycemia, unspecified: Secondary | ICD-10-CM

## 2019-09-19 DIAGNOSIS — E871 Hypo-osmolality and hyponatremia: Secondary | ICD-10-CM | POA: Diagnosis present

## 2019-09-19 DIAGNOSIS — R0600 Dyspnea, unspecified: Secondary | ICD-10-CM

## 2019-09-19 DIAGNOSIS — H40052 Ocular hypertension, left eye: Secondary | ICD-10-CM | POA: Diagnosis present

## 2019-09-19 DIAGNOSIS — E876 Hypokalemia: Secondary | ICD-10-CM | POA: Diagnosis present

## 2019-09-19 DIAGNOSIS — I5032 Chronic diastolic (congestive) heart failure: Secondary | ICD-10-CM | POA: Diagnosis present

## 2019-09-19 DIAGNOSIS — M898X9 Other specified disorders of bone, unspecified site: Secondary | ICD-10-CM | POA: Diagnosis present

## 2019-09-19 DIAGNOSIS — E872 Acidosis: Secondary | ICD-10-CM | POA: Diagnosis present

## 2019-09-19 DIAGNOSIS — R32 Unspecified urinary incontinence: Secondary | ICD-10-CM | POA: Diagnosis present

## 2019-09-19 DIAGNOSIS — D631 Anemia in chronic kidney disease: Secondary | ICD-10-CM | POA: Diagnosis present

## 2019-09-19 DIAGNOSIS — Z7984 Long term (current) use of oral hypoglycemic drugs: Secondary | ICD-10-CM

## 2019-09-19 DIAGNOSIS — N189 Chronic kidney disease, unspecified: Secondary | ICD-10-CM

## 2019-09-19 DIAGNOSIS — H42 Glaucoma in diseases classified elsewhere: Secondary | ICD-10-CM | POA: Diagnosis present

## 2019-09-19 DIAGNOSIS — Z885 Allergy status to narcotic agent status: Secondary | ICD-10-CM

## 2019-09-19 DIAGNOSIS — K3184 Gastroparesis: Secondary | ICD-10-CM | POA: Diagnosis present

## 2019-09-19 DIAGNOSIS — R531 Weakness: Secondary | ICD-10-CM

## 2019-09-19 DIAGNOSIS — N179 Acute kidney failure, unspecified: Secondary | ICD-10-CM | POA: Diagnosis present

## 2019-09-19 DIAGNOSIS — E1139 Type 2 diabetes mellitus with other diabetic ophthalmic complication: Principal | ICD-10-CM | POA: Diagnosis present

## 2019-09-19 DIAGNOSIS — Z79899 Other long term (current) drug therapy: Secondary | ICD-10-CM

## 2019-09-19 DIAGNOSIS — H5462 Unqualified visual loss, left eye, normal vision right eye: Secondary | ICD-10-CM | POA: Diagnosis present

## 2019-09-19 DIAGNOSIS — D638 Anemia in other chronic diseases classified elsewhere: Secondary | ICD-10-CM

## 2019-09-19 DIAGNOSIS — E1140 Type 2 diabetes mellitus with diabetic neuropathy, unspecified: Secondary | ICD-10-CM | POA: Diagnosis present

## 2019-09-19 DIAGNOSIS — H4051X0 Glaucoma secondary to other eye disorders, right eye, stage unspecified: Secondary | ICD-10-CM | POA: Diagnosis present

## 2019-09-19 DIAGNOSIS — E785 Hyperlipidemia, unspecified: Secondary | ICD-10-CM | POA: Diagnosis present

## 2019-09-19 DIAGNOSIS — Z8249 Family history of ischemic heart disease and other diseases of the circulatory system: Secondary | ICD-10-CM

## 2019-09-19 DIAGNOSIS — Z9114 Patient's other noncompliance with medication regimen: Secondary | ICD-10-CM

## 2019-09-19 DIAGNOSIS — E11649 Type 2 diabetes mellitus with hypoglycemia without coma: Secondary | ICD-10-CM | POA: Diagnosis present

## 2019-09-19 DIAGNOSIS — I160 Hypertensive urgency: Secondary | ICD-10-CM | POA: Diagnosis present

## 2019-09-19 DIAGNOSIS — E113591 Type 2 diabetes mellitus with proliferative diabetic retinopathy without macular edema, right eye: Secondary | ICD-10-CM | POA: Diagnosis present

## 2019-09-19 DIAGNOSIS — Z20822 Contact with and (suspected) exposure to covid-19: Secondary | ICD-10-CM | POA: Diagnosis present

## 2019-09-19 DIAGNOSIS — E1143 Type 2 diabetes mellitus with diabetic autonomic (poly)neuropathy: Secondary | ICD-10-CM | POA: Diagnosis present

## 2019-09-19 DIAGNOSIS — Z833 Family history of diabetes mellitus: Secondary | ICD-10-CM

## 2019-09-19 LAB — COMPREHENSIVE METABOLIC PANEL
ALT: 17 U/L (ref 0–44)
AST: 27 U/L (ref 15–41)
Albumin: 2.4 g/dL — ABNORMAL LOW (ref 3.5–5.0)
Alkaline Phosphatase: 76 U/L (ref 38–126)
Anion gap: 14 (ref 5–15)
BUN: 26 mg/dL — ABNORMAL HIGH (ref 6–20)
CO2: 22 mmol/L (ref 22–32)
Calcium: 8.4 mg/dL — ABNORMAL LOW (ref 8.9–10.3)
Chloride: 97 mmol/L — ABNORMAL LOW (ref 98–111)
Creatinine, Ser: 3.34 mg/dL — ABNORMAL HIGH (ref 0.61–1.24)
GFR calc Af Amer: 24 mL/min — ABNORMAL LOW (ref >60)
GFR calc non Af Amer: 21 mL/min — ABNORMAL LOW (ref >60)
Glucose, Bld: 74 mg/dL (ref 70–99)
Potassium: 2.7 mmol/L — CL (ref 3.5–5.1)
Sodium: 133 mmol/L — ABNORMAL LOW (ref 135–145)
Total Bilirubin: 0.5 mg/dL (ref 0.3–1.2)
Total Protein: 6.7 g/dL (ref 6.5–8.1)

## 2019-09-19 LAB — CBC WITH DIFFERENTIAL/PLATELET
Abs Immature Granulocytes: 0.06 10*3/uL (ref 0.00–0.07)
Basophils Absolute: 0 10*3/uL (ref 0.0–0.1)
Basophils Relative: 0 %
Eosinophils Absolute: 0.3 10*3/uL (ref 0.0–0.5)
Eosinophils Relative: 2 %
HCT: 31.9 % — ABNORMAL LOW (ref 39.0–52.0)
Hemoglobin: 10.7 g/dL — ABNORMAL LOW (ref 13.0–17.0)
Immature Granulocytes: 1 %
Lymphocytes Relative: 12 %
Lymphs Abs: 1.4 10*3/uL (ref 0.7–4.0)
MCH: 27.9 pg (ref 26.0–34.0)
MCHC: 33.5 g/dL (ref 30.0–36.0)
MCV: 83.3 fL (ref 80.0–100.0)
Monocytes Absolute: 1.1 10*3/uL — ABNORMAL HIGH (ref 0.1–1.0)
Monocytes Relative: 9 %
Neutro Abs: 8.8 10*3/uL — ABNORMAL HIGH (ref 1.7–7.7)
Neutrophils Relative %: 76 %
Platelets: 294 10*3/uL (ref 150–400)
RBC: 3.83 MIL/uL — ABNORMAL LOW (ref 4.22–5.81)
RDW: 13.2 % (ref 11.5–15.5)
WBC: 11.6 10*3/uL — ABNORMAL HIGH (ref 4.0–10.5)
nRBC: 0 % (ref 0.0–0.2)

## 2019-09-19 LAB — HEMOGLOBIN A1C
Hgb A1c MFr Bld: 6.4 % — ABNORMAL HIGH (ref 4.8–5.6)
Mean Plasma Glucose: 136.98 mg/dL

## 2019-09-19 LAB — URINALYSIS, ROUTINE W REFLEX MICROSCOPIC
Bilirubin Urine: NEGATIVE
Glucose, UA: 50 mg/dL — AB
Hgb urine dipstick: NEGATIVE
Ketones, ur: NEGATIVE mg/dL
Leukocytes,Ua: NEGATIVE
Nitrite: NEGATIVE
Protein, ur: >=300 mg/dL — AB
Specific Gravity, Urine: 1.016 (ref 1.005–1.030)
pH: 5 (ref 5.0–8.0)

## 2019-09-19 LAB — SARS CORONAVIRUS 2 (TAT 6-24 HRS): SARS Coronavirus 2: NEGATIVE

## 2019-09-19 LAB — CBG MONITORING, ED: Glucose-Capillary: 81 mg/dL (ref 70–99)

## 2019-09-19 LAB — GLUCOSE, CAPILLARY
Glucose-Capillary: 82 mg/dL (ref 70–99)
Glucose-Capillary: 80 mg/dL (ref 70–99)

## 2019-09-19 LAB — LIPASE, BLOOD: Lipase: 25 U/L (ref 11–51)

## 2019-09-19 LAB — SODIUM, URINE, RANDOM: Sodium, Ur: 42 mmol/L

## 2019-09-19 LAB — CREATININE, URINE, RANDOM: Creatinine, Urine: 107.48 mg/dL

## 2019-09-19 MED ORDER — FLUORESCEIN SODIUM 1 MG OP STRP
1.0000 | ORAL_STRIP | Freq: Once | OPHTHALMIC | Status: AC
  Administered 2019-09-19: 1 via OPHTHALMIC
  Filled 2019-09-19: qty 1

## 2019-09-19 MED ORDER — TROPICAMIDE 1 % OP SOLN
3.0000 [drp] | Freq: Once | OPHTHALMIC | Status: AC
  Administered 2019-09-19: 3 [drp] via OPHTHALMIC
  Filled 2019-09-19: qty 15

## 2019-09-19 MED ORDER — HEPARIN SODIUM (PORCINE) 5000 UNIT/ML IJ SOLN
5000.0000 [IU] | Freq: Three times a day (TID) | INTRAMUSCULAR | Status: DC
  Administered 2019-09-19 – 2019-09-23 (×14): 5000 [IU] via SUBCUTANEOUS
  Filled 2019-09-19 (×13): qty 1

## 2019-09-19 MED ORDER — ACETAMINOPHEN 650 MG RE SUPP: 650.0000 mg | Freq: Four times a day (QID) | RECTAL | Status: DC | PRN

## 2019-09-19 MED ORDER — LATANOPROST 0.005 % OP SOLN
1.0000 [drp] | Freq: Every day | OPHTHALMIC | Status: DC
  Administered 2019-09-20 – 2019-09-23 (×5): 1 [drp] via OPHTHALMIC
  Filled 2019-09-19: qty 2.5

## 2019-09-19 MED ORDER — FENTANYL CITRATE (PF) 100 MCG/2ML IJ SOLN
25.0000 ug | Freq: Once | INTRAMUSCULAR | Status: AC
  Administered 2019-09-19: 25 ug via INTRAVENOUS
  Filled 2019-09-19 (×2): qty 2

## 2019-09-19 MED ORDER — HYDROCODONE-ACETAMINOPHEN 5-325 MG PO TABS
1.0000 | ORAL_TABLET | Freq: Four times a day (QID) | ORAL | Status: DC | PRN
  Administered 2019-09-19 – 2019-09-22 (×7): 1 via ORAL
  Filled 2019-09-19 (×7): qty 1

## 2019-09-19 MED ORDER — GABAPENTIN 100 MG PO CAPS
300.0000 mg | ORAL_CAPSULE | Freq: Two times a day (BID) | ORAL | Status: DC
  Administered 2019-09-19 – 2019-09-23 (×9): 300 mg via ORAL
  Filled 2019-09-19 (×9): qty 3

## 2019-09-19 MED ORDER — TETRACAINE HCL 0.5 % OP SOLN
2.0000 [drp] | Freq: Once | OPHTHALMIC | Status: AC
  Administered 2019-09-19: 2 [drp] via OPHTHALMIC
  Filled 2019-09-19: qty 4

## 2019-09-19 MED ORDER — HYDRALAZINE HCL 25 MG PO TABS
25.0000 mg | ORAL_TABLET | Freq: Three times a day (TID) | ORAL | Status: DC
  Administered 2019-09-19 – 2019-09-23 (×12): 25 mg via ORAL
  Filled 2019-09-19 (×12): qty 1

## 2019-09-19 MED ORDER — CARVEDILOL 12.5 MG PO TABS
25.0000 mg | ORAL_TABLET | Freq: Two times a day (BID) | ORAL | Status: DC
  Administered 2019-09-19 – 2019-09-23 (×9): 25 mg via ORAL
  Filled 2019-09-19 (×9): qty 2

## 2019-09-19 MED ORDER — ASPIRIN EC 81 MG PO TBEC
81.0000 mg | DELAYED_RELEASE_TABLET | Freq: Every day | ORAL | Status: DC
  Administered 2019-09-19 – 2019-09-23 (×5): 81 mg via ORAL
  Filled 2019-09-19 (×5): qty 1

## 2019-09-19 MED ORDER — SODIUM CHLORIDE 0.9 % IV BOLUS
1000.0000 mL | Freq: Once | INTRAVENOUS | Status: AC
  Administered 2019-09-19: 1000 mL via INTRAVENOUS

## 2019-09-19 MED ORDER — BRIMONIDINE TARTRATE 0.2 % OP SOLN
1.0000 [drp] | Freq: Two times a day (BID) | OPHTHALMIC | Status: DC
  Administered 2019-09-20 – 2019-09-23 (×9): 1 [drp] via OPHTHALMIC
  Filled 2019-09-19: qty 5

## 2019-09-19 MED ORDER — POTASSIUM CHLORIDE 10 MEQ/100ML IV SOLN
10.0000 meq | Freq: Once | INTRAVENOUS | Status: AC
  Administered 2019-09-19: 10 meq via INTRAVENOUS
  Filled 2019-09-19: qty 100

## 2019-09-19 MED ORDER — PROPARACAINE HCL 0.5 % OP SOLN
1.0000 [drp] | Freq: Once | OPHTHALMIC | Status: AC
  Administered 2019-09-19: 1 [drp] via OPHTHALMIC
  Filled 2019-09-19: qty 15

## 2019-09-19 MED ORDER — ALBUTEROL SULFATE (2.5 MG/3ML) 0.083% IN NEBU
2.5000 mg | INHALATION_SOLUTION | Freq: Four times a day (QID) | RESPIRATORY_TRACT | Status: DC | PRN
  Administered 2019-09-23: 2.5 mg via RESPIRATORY_TRACT
  Filled 2019-09-19: qty 3

## 2019-09-19 MED ORDER — POTASSIUM CHLORIDE CRYS ER 20 MEQ PO TBCR
30.0000 meq | EXTENDED_RELEASE_TABLET | ORAL | Status: AC
  Administered 2019-09-19: 30 meq via ORAL
  Filled 2019-09-19: qty 2

## 2019-09-19 MED ORDER — AMLODIPINE BESYLATE 5 MG PO TABS
10.0000 mg | ORAL_TABLET | Freq: Every day | ORAL | Status: DC
  Administered 2019-09-19 – 2019-09-21 (×3): 10 mg via ORAL
  Filled 2019-09-19 (×3): qty 2

## 2019-09-19 MED ORDER — DORZOLAMIDE HCL-TIMOLOL MAL 2-0.5 % OP SOLN
1.0000 [drp] | Freq: Two times a day (BID) | OPHTHALMIC | Status: DC
  Administered 2019-09-20 – 2019-09-23 (×9): 1 [drp] via OPHTHALMIC
  Filled 2019-09-19: qty 10

## 2019-09-19 MED ORDER — ONDANSETRON HCL 4 MG/2ML IJ SOLN: 4.0000 mg | Freq: Four times a day (QID) | INTRAMUSCULAR | Status: DC | PRN

## 2019-09-19 MED ORDER — ACETAMINOPHEN 325 MG PO TABS
650.0000 mg | ORAL_TABLET | Freq: Four times a day (QID) | ORAL | Status: DC | PRN
  Administered 2019-09-19 – 2019-09-22 (×2): 650 mg via ORAL
  Filled 2019-09-19 (×3): qty 2

## 2019-09-19 MED ORDER — HYDRALAZINE HCL 20 MG/ML IJ SOLN: 10.0000 mg | INTRAMUSCULAR | Status: DC | PRN

## 2019-09-19 MED ORDER — POTASSIUM CHLORIDE CRYS ER 20 MEQ PO TBCR
40.0000 meq | EXTENDED_RELEASE_TABLET | Freq: Once | ORAL | Status: AC
  Administered 2019-09-19: 40 meq via ORAL
  Filled 2019-09-19: qty 2

## 2019-09-19 MED ORDER — CYCLOBENZAPRINE HCL 10 MG PO TABS: 10.0000 mg | ORAL_TABLET | Freq: Every evening | ORAL | Status: DC | PRN

## 2019-09-19 MED ORDER — ONDANSETRON HCL 4 MG PO TABS
4.0000 mg | ORAL_TABLET | Freq: Four times a day (QID) | ORAL | Status: DC | PRN
  Administered 2019-09-19: 4 mg via ORAL
  Filled 2019-09-19: qty 1

## 2019-09-19 NOTE — Care Management (Addendum)
ED CM was made aware by Gregor Hams on [facility] that patient is being referred to  [facility] Ophthalmology who will see patient today. Patient is blind and has no one with him. Family is in ED parking lot, CM unable to find family and no numbers listed in record.  CM was able to get in touch with family who was told patient is up for discharge, ED Nsg Staff notified to contact Dulcy Fanny [phone] when patient is ready to be discharged from the ED.

## 2019-09-19 NOTE — H&P (Addendum)
History and Physical    Christopher Davila ZJQ:[id number] DOB: Feb 23, 1971 DOA: 09/19/2019  Referring MD/NP/PA: Domenic Moras, PA-C PCP: Marliss Coots, NP  Patient coming from: via EMS  Chief Complaint: Left eye pain and vision loss  I have personally briefly reviewed patient's old medical records in [place]   HPI: Christopher Davila is a 49 y.o. male with medical history significant of uncontrolled hypertension , diabetes mellitus type 2 with neuropathy, chronic kidney disease stage IIIb, and right vision loss, presented with complaints of left eye pain over the last 3 days.  Complains of being unable to see out of left eye, but notes that he may be able to see some shadows with light.  At home patient reports that he has intermittently been forgetting to take his medications.  His blood sugars have just been reading high when he checks.  Today he ate some fruit and took a nap and when he woke up out of his sleep was sweating and had severe pain in his left eye.  Patient had been previously hospitalized in December 2020 for vision loss in the right eye.  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  He took the eyedrops as prescribed, but never followed up thereafter for refills.  In route with EMS patient was noted to have a blood sugar of 60 and was given 2.5 g of D10 with initial blood pressures elevated 200/130.  Repeat blood glucose 83.   ED Course: Upon admission to the emergency department patient was seen to be afebrile, heart rate 95-103, blood pressures 177/115-183/122, and all other vital signs maintained.  Left intraocular pressure was noted to be 29 mgHg Dr. Posey Pronto of ophthalmology was consulted and have requested patient to be sent to this office immediately.  Labs significant for WBC 11.6, hemoglobin 10.7, sodium 133, potassium 2.7, BUN 26, creatinine 3.34, and glucose 81.   Following lab work  [facility] was called to admit the patient..  Dr. Posey Pronto was notified and would be by to see the patient at some point in time.  Patient having given fentanyl 25 mcg, 1 L normal saline fluids, 10 mEq of potassium chloride, and 40 mEq of potassium chloride p.o.   Review of Systems  Constitutional: Positive for diaphoresis and malaise/fatigue. Negative for fever.  HENT: Negative for congestion and ear discharge.   Eyes: Positive for blurred vision, photophobia, pain and redness.  Respiratory: Negative for shortness of breath.   Gastrointestinal: Positive for nausea. Negative for abdominal pain, diarrhea and vomiting.  Genitourinary: Negative for dysuria and hematuria.  Musculoskeletal: Negative for falls.  Neurological: Positive for headaches. Negative for focal weakness and loss of consciousness.  Psychiatric/Behavioral: Negative for memory loss and substance abuse.    Past Medical History:  Diagnosis Date  . Abdominal pain 12/28/2012  . Acute kidney injury superimposed on chronic kidney disease ([place])   . Chest pain 02/14/2014  . Diabetes mellitus   . Dyspnea   . Epigastric abdominal pain   . Gastroparesis due to DM ([place])   . Hypertension   . Hypoalbuminemia 04/17/2019  . Hypoxia   . Nausea   . Neuropathy of lower extremity   . Oral candidiasis 06/26/2012  . Pancreatitis 06/2012  . Pancreatitis 06/26/2012  . Weight loss 06/26/2012  . Wheezing     History reviewed. No pertinent surgical history.   reports that he quit smoking about 4 years ago. His smoking  use included cigarettes. He smoked 0.00 packs per day. He has never used smokeless tobacco. He reports previous alcohol use. He reports that he does not use drugs.  Allergies  Allergen Reactions  . Morphine And Related Itching and Other (See Comments)    Pt prefers not to be given this drug    Family History  Problem Relation Age of Onset  . Diabetes Mother   . Lung disease Mother   . Hypertension Mother   . Diabetes Maternal  Aunt   . CAD Maternal Aunt   . CAD Cousin     Prior to Admission medications   Medication Sig Start Date End Date Taking? Authorizing Provider  amLODipine (NORVASC) 10 MG tablet Take 1 tablet (10 mg total) by mouth daily. 06/17/19   Jose Persia, MD  aspirin EC 81 MG tablet Take 1 tablet (81 mg total) by mouth daily. 05/24/15   Ghimire, Henreitta Leber, MD  atorvastatin (LIPITOR) 80 MG tablet Take 1 tablet (80 mg total) by mouth daily. 02/19/19   Gladys Damme, MD  carvedilol (COREG) 25 MG tablet Take 1 tablet (25 mg total) by mouth 2 (two) times daily with a meal. 02/19/19   Gladys Damme, MD  gabapentin (NEURONTIN) 300 MG capsule Take 1 capsule (300 mg total) by mouth 3 (three) times daily. 02/19/19   Gladys Damme, MD  glipiZIDE (GLUCOTROL XL) 5 MG 24 hr tablet Take 1 tablet (5 mg total) by mouth daily with breakfast. 04/18/19 07/07/19  Darliss Cheney, MD  hydrALAZINE (APRESOLINE) 25 MG tablet Take 1 tablet (25 mg total) by mouth 3 (three) times daily. 06/17/19 08/16/19  Jose Persia, MD  potassium chloride SA (KLOR-CON) 20 MEQ tablet Take 2 tablets (40 mEq total) by mouth daily. Patient taking differently: Take 40 mEq by mouth daily as needed.  04/18/19 07/07/19  Darliss Cheney, MD  sitaGLIPtin (JANUVIA) 50 MG tablet Take 1 tablet (50 mg total) by mouth daily. Patient not taking: Reported on 07/07/2019 04/18/19 05/18/19  Darliss Cheney, MD    Physical Exam:  Constitutional: Middle-age male who appears to be in symptoms Vitals:   09/19/19 1053 09/19/19 1100 09/19/19 1115 09/19/19 1130  BP: (!) 183/122 (!) 177/115 (!) 182/114 (!) 178/108  Pulse: (!) 103 95 98   Resp: 16     Temp: 97.8 F (36.6 C)     TempSrc: Oral     SpO2: 99% 100% 99%   Weight: 59 kg     Height: 5\' 3"  (1.6 m)      Eyes: Conjunctival injection and chemosis present of the left eye  ENMT: Mucous membranes are moist. Posterior pharynx clear of any exudate or lesions.Normal dentition.  Neck: normal, supple, no masses, no  thyromegaly Respiratory: clear to auscultation bilaterally, no wheezing, no crackles. Normal respiratory effort. No accessory muscle use.  Cardiovascular: Regular rate and rhythm, no murmurs / rubs / gallops. No extremity edema. 2+ pedal pulses. No carotid bruits.  Abdomen: no tenderness, no masses palpated. No hepatosplenomegaly. Bowel sounds positive.  Musculoskeletal: no clubbing / cyanosis. No joint deformity upper and lower extremities. Good ROM, no contractures. Normal muscle tone.  Skin: no rashes, lesions, ulcers. No induration Neurologic: CN 2-12 grossly intact. Sensation intact, DTR normal. Strength 5/5 in all 4.  Psychiatric: Normal judgment and insight. Alert and oriented x 3. Normal mood.     Labs on Admission: I have personally reviewed following labs and imaging studies  CBC: Recent Labs  Lab 09/19/19 1131  WBC 11.6*  NEUTROABS 8.8*  HGB 10.7*  HCT 31.9*  MCV 83.3  PLT 782   Basic Metabolic Panel: Recent Labs  Lab 09/19/19 1131  NA 133*  K 2.7*  CL 97*  CO2 22  GLUCOSE 74  BUN 26*  CREATININE 3.34*  CALCIUM 8.4*   GFR: Estimated Creatinine Clearance: 22 mL/min (A) (by C-G formula based on SCr of 3.34 mg/dL (H)). Liver Function Tests: Recent Labs  Lab 09/19/19 1131  AST 27  ALT 17  ALKPHOS 76  BILITOT 0.5  PROT 6.7  ALBUMIN 2.4*   Recent Labs  Lab 09/19/19 1131  LIPASE 25   No results for input(s): AMMONIA in the last 168 hours. Coagulation Profile: No results for input(s): INR, PROTIME in the last 168 hours. Cardiac Enzymes: No results for input(s): CKTOTAL, CKMB, CKMBINDEX, TROPONINI in the last 168 hours. BNP (last 3 results) No results for input(s): PROBNP in the last 8760 hours. HbA1C: No results for input(s): HGBA1C in the last 72 hours. CBG: Recent Labs  Lab 09/19/19 1111  GLUCAP 81   Lipid Profile: No results for input(s): CHOL, HDL, LDLCALC, TRIG, CHOLHDL, LDLDIRECT in the last 72 hours. Thyroid Function Tests: No results  for input(s): TSH, T4TOTAL, FREET4, T3FREE, THYROIDAB in the last 72 hours. Anemia Panel: No results for input(s): VITAMINB12, FOLATE, FERRITIN, TIBC, IRON, RETICCTPCT in the last 72 hours. Urine analysis:    Component Value Date/Time   COLORURINE YELLOW 06/16/2019 1040   APPEARANCEUR HAZY (A) 06/16/2019 1040   LABSPEC 1.016 06/16/2019 1040   PHURINE 5.0 06/16/2019 1040   GLUCOSEU >=500 (A) 06/16/2019 1040   HGBUR SMALL (A) 06/16/2019 [place] 06/16/2019 1040   KETONESUR NEGATIVE 06/16/2019 1040   PROTEINUR >=300 (A) 06/16/2019 1040   UROBILINOGEN 0.2 12/13/2014 2056   NITRITE NEGATIVE 06/16/2019 1040   LEUKOCYTESUR NEGATIVE 06/16/2019 1040   Sepsis Labs: No results found for this or any previous visit (from the past 240 hour(s)).   Radiological Exams on Admission: No results found.  EKG: Independently reviewed.  Sinus rhythm at 99bpm with lead reversal  Assessment/Plan Left eye pain and vision loss: Acute.  Patient previously had vision loss of the right eye and appears secondary to neurovascular glaucoma with proliferative diabetic retinopathy.  It appears he did not follow-up after initial treatments.  Ophthalmology consulted Dr. Jalene Mullet.  Suspect similar cause of symptoms. -Admit to medical telemetry bed -Pain control -Appreciate Dr. Posey Pronto of ophthalmology, we will follow for any further recommendation  Acute kidney injury superimposed on chronic kidney disease stage III: Patient presents with creatinine of 3.34 with BUN 26.  Baseline creatinine previously noted to be around 2.8-3.   Patient had been ordered 2 L of normal saline IV fluids.  Suspect secondary to uncontrolled hypertension.  -Continue to counsel need to avoid NSAIDs -Recheck kidney function in a.m. -Give additional IV fluids if dehydration thought to be because of patient's symptoms  Hypertensive urgency: Acute.  Blood pressures reported to be elevated up to 200/130.  Home blood  pressure medications include amlodipine 10 mg daily, hydralazine 25 mg 3 times daily, Coreg 25 mg daily, and Lasix 40 mg as needed for edema(reported to have not started this yet). -Coreg switched to 25 mg twice daily.  Patient had been taking only once daily. -Continue amlodipine and hydralazine -Hydralazine IV as needed  Hypokalemia: Acute initial potassium noted to be 2.7.  Patient had been given 10 mEq of potassium chloride IV and 40 mEq p.o. -Give additional 30 mEq x  1 dose now -Continue to monitor and place as needed  Diabetes mellitus type 2 with hypoglycemia: Acute.  Patient home medication regimen includes glipizide 20 mg twice daily.  He had also been recently started on Januvia 50 mg daily, but reported that he had not started this medication yet.  Initial blood glucose was noted to be around 60 upon EMS arrival.  Last hemoglobin A1c on 06/16/2019 was noted to be 7.6. -Hypoglycemic protocols -Check hemoglobin A1c -Gabapentin switch from 300 mg 3 times daily to twice daily -Hold glipizide as dose may need to be adjusted -CBGs every 4 hours  Leukocytosis: WBC elevated 11.6.  Suspect this is possibly reactive to the above. -Recheck CBC in a.m.  Hyponatremia: Acute.  Sodium 133 glucose within normal limits.  -Recheck levels in a.m.  HFpEF: Last EF noted to be 55 to 60% with pseudonormalization in 01/2019.  No signs of patient appearing to be acutely fluid overloaded at this time. -Continue to monitor  Anemia of chronic kidney disease: Hemoglobin 10.7 which appears near patient's baseline. -Continue to monitor  Homelessness: His cousin reports that the patient is homeless and will likely need placement. -Transitions of care consult   DVT prophylaxis: Heparin Code Status: Full Family Communication: Christopher Davila updated over the phone Disposition Plan: Possible discharge home in 1-2 Consults called: Ophthalmology Dr. Posey Pronto Admission status: observation  Norval Morton MD  [facility] Pager [phone]   If 7PM-7AM, please contact night-coverage [email or web address] Password [email or web address]  09/19/2019, 1:10 PM

## 2019-09-19 NOTE — Discharge Instructions (Addendum)
You need to be at [facility] at [facility] at 9 AM tomorrow so you can see a glaucoma specialist. Phone number: [phone]

## 2019-09-19 NOTE — Progress Notes (Signed)
NEW ADMISSION NOTE New Admission Note:   Arrival Method: stretcher from ED Mental Orientation: axox4 Telemetry: box 7 Assessment: Completed Skin:see skin assessment  IV: LAC Pain: 8/10 in left eye- see MAR  Tubes: none  Safety Measures: Safety Fall Prevention Plan has been discussed Admission: To be Completed [facility] [place] Orientation: Patient has been orientated to the room, unit and staff.  Family: none at the bedside   Orders have been reviewed and implemented. Will continue to monitor the patient. Call light has been placed within reach and bed alarm has been activated.   Paulla Fore, RN, BSN

## 2019-09-19 NOTE — ED Provider Notes (Addendum)
[facility] EMERGENCY DEPARTMENT Provider Note   CSN: [id number] Arrival date & time: 09/19/19  1043     History Chief Complaint  Patient presents with  . Hypoglycemia  . Hypertension    Christopher Davila is a 49 y.o. male.  The history is provided by the patient and medical records. No language interpreter was used.     49 year old male with history of diabetes, hypertension, gastroparesis, pancreatitis, right eye vision loss likely secondary to diabetic retinopathy presenting complaining of left eye pain.  Patient report for the past 3 days he has had persistent pain in his eye.  He described as a foreign body sensation in his eye is painful hurts when he moves and he also report loss of vision.  States he may be able to detect some slight shadow but is unable to see through his left eye.  He also endorsed feeling generally weak throughout.  Endorsed nausea without vomiting.  He does not complain of any fever chills he denies any injury and denies anything that struck his eye.  No complaints of chest pain or shortness of breath denies any focal numbness or focal weakness or confusion.  He has been compliant with his diabetic medication but admits that he did not take his blood pressure medication today.  EMS arrived and noted that his blood sugar was low in the 60s, patient was giving D10 on route.  Past Medical History:  Diagnosis Date  . Abdominal pain 12/28/2012  . Acute kidney injury superimposed on chronic kidney disease ([place])   . Chest pain 02/14/2014  . Diabetes mellitus   . Dyspnea   . Epigastric abdominal pain   . Gastroparesis due to DM ([place])   . Hypertension   . Hypoalbuminemia 04/17/2019  . Hypoxia   . Nausea   . Neuropathy of lower extremity   . Oral candidiasis 06/26/2012  . Pancreatitis 06/2012  . Pancreatitis 06/26/2012  . Weight loss 06/26/2012  . Wheezing     Patient Active Problem List   Diagnosis Date Noted  . Acute loss of vision, right  06/16/2019  . Hypertensive urgency 04/17/2019  . Hypokalemia 04/17/2019  . Normocytic anemia 04/17/2019  . Heart failure ([place]) 02/13/2019  . Hyperglycemia   . CKD (chronic kidney disease) stage 3, GFR 30-59 ml/min   . Alcohol use   . Gastroparesis due to DM ([place])   . Essential hypertension   . Hyperlipidemia   . Diabetes mellitus type 2 with complications, uncontrolled ([place]) 06/26/2012  . Hypertension 06/26/2012    No past surgical history on file.     Family History  Problem Relation Age of Onset  . Diabetes Mother   . Lung disease Mother   . Hypertension Mother   . Diabetes Maternal Aunt   . CAD Maternal Aunt   . CAD Cousin     Social History   Tobacco Use  . Smoking status: Former Smoker    Packs/day: 0.00    Types: Cigarettes    Quit date: 03/22/2015    Years since quitting: 4.4  . Smokeless tobacco: Never Used  Substance Use Topics  . Alcohol use: Not Currently  . Drug use: No    Home Medications Prior to Admission medications   Medication Sig Start Date End Date Taking? Authorizing Provider  amLODipine (NORVASC) 10 MG tablet Take 1 tablet (10 mg total) by mouth daily. 06/17/19   Jose Persia, MD  aspirin EC 81 MG tablet Take 1 tablet (81 mg total)  by mouth daily. 05/24/15   Ghimire, Henreitta Leber, MD  atorvastatin (LIPITOR) 80 MG tablet Take 1 tablet (80 mg total) by mouth daily. 02/19/19   Gladys Damme, MD  carvedilol (COREG) 25 MG tablet Take 1 tablet (25 mg total) by mouth 2 (two) times daily with a meal. 02/19/19   Gladys Damme, MD  gabapentin (NEURONTIN) 300 MG capsule Take 1 capsule (300 mg total) by mouth 3 (three) times daily. 02/19/19   Gladys Damme, MD  glipiZIDE (GLUCOTROL XL) 5 MG 24 hr tablet Take 1 tablet (5 mg total) by mouth daily with breakfast. 04/18/19 07/07/19  Darliss Cheney, MD  hydrALAZINE (APRESOLINE) 25 MG tablet Take 1 tablet (25 mg total) by mouth 3 (three) times daily. 06/17/19 08/16/19  Jose Persia, MD  potassium chloride SA  (KLOR-CON) 20 MEQ tablet Take 2 tablets (40 mEq total) by mouth daily. Patient taking differently: Take 40 mEq by mouth daily as needed.  04/18/19 07/07/19  Darliss Cheney, MD  sitaGLIPtin (JANUVIA) 50 MG tablet Take 1 tablet (50 mg total) by mouth daily. Patient not taking: Reported on 07/07/2019 04/18/19 05/18/19  Darliss Cheney, MD    Allergies    Morphine and related  Review of Systems   Review of Systems  All other systems reviewed and are negative.   Physical Exam Updated Vital Signs BP (!) 178/108   Pulse 98   Temp 97.8 F (36.6 C) (Oral)   Resp 16   Ht 5\' 3"  (1.6 m)   Wt 59 kg   SpO2 99%   BMI 23.03 kg/m   Physical Exam Vitals and nursing note reviewed.  Constitutional:      General: He is not in acute distress.    Appearance: He is well-developed.  HENT:     Head: Normocephalic and atraumatic.  Eyes:     General: Lids are normal. Lids are everted, no foreign bodies appreciated. Visual field deficit present.        Left eye: No foreign body, discharge or hordeolum.     Intraocular pressure: Left eye pressure is 29 mmHg. Measurements were taken using a handheld tonometer.    Extraocular Movements: Extraocular movements intact.     Conjunctiva/sclera:     Right eye: Right conjunctiva is not injected. No chemosis, exudate or hemorrhage.    Left eye: Left conjunctiva is injected. Chemosis present. No exudate or hemorrhage.    Pupils:     Right eye: Pupil is not reactive. Pupil is round. No corneal abrasion.     Left eye: Pupil is not reactive. Pupil is round. No corneal abrasion or fluorescein uptake.     Slit lamp exam:    Left eye: No corneal ulcer or hyphema.  Cardiovascular:     Rate and Rhythm: Normal rate and regular rhythm.     Pulses: Normal pulses.     Heart sounds: Normal heart sounds.  Pulmonary:     Effort: Pulmonary effort is normal.     Breath sounds: Normal breath sounds.  Abdominal:     Palpations: Abdomen is soft.  Musculoskeletal:     Cervical  back: Neck supple.     Comments: Globally weak but able to move all 4 extremities equally.  Skin:    Findings: No rash.  Neurological:     Mental Status: He is alert.     ED Results / Procedures / Treatments   Labs (all labs ordered are listed, but only abnormal results are displayed) Labs Reviewed  CBC WITH DIFFERENTIAL/PLATELET -  Abnormal; Notable for the following components:      Result Value   WBC 11.6 (*)    RBC 3.83 (*)    Hemoglobin 10.7 (*)    HCT 31.9 (*)    Neutro Abs 8.8 (*)    Monocytes Absolute 1.1 (*)    All other components within normal limits  COMPREHENSIVE METABOLIC PANEL - Abnormal; Notable for the following components:   Sodium 133 (*)    Potassium 2.7 (*)    Chloride 97 (*)    BUN 26 (*)    Creatinine, Ser 3.34 (*)    Calcium 8.4 (*)    Albumin 2.4 (*)    GFR calc non Af Amer 21 (*)    GFR calc Af Amer 24 (*)    All other components within normal limits  HEMOGLOBIN A1C - Abnormal; Notable for the following components:   Hgb A1c MFr Bld 6.4 (*)    All other components within normal limits  SARS CORONAVIRUS 2 (TAT 6-24 HRS)  LIPASE, BLOOD  URINALYSIS, ROUTINE W REFLEX MICROSCOPIC  CBG MONITORING, ED    EKG None   Date: 09/19/2019  Rate: 99  Rhythm: normal sinus rhythm  QRS Axis: normal  Intervals: normal  ST/T Wave abnormalities: normal  Conduction Disutrbances: none  Narrative Interpretation:   Old EKG Reviewed: No significant changes noted     Radiology No results found.  Procedures .Critical Care Performed by: Domenic Moras, PA-C Authorized by: Domenic Moras, PA-C   Critical care provider statement:    Critical care time (minutes):  31   Critical care was time spent personally by me on the following activities:  Discussions with consultants, evaluation of patient's response to treatment, examination of patient, ordering and performing treatments and interventions, ordering and review of laboratory studies, ordering and review of  radiographic studies, pulse oximetry, re-evaluation of patient's condition, obtaining history from patient or surrogate and review of old charts   (including critical care time)  Medications Ordered in ED Medications  fluorescein ophthalmic strip 1 strip (1 strip Left Eye Given 09/19/19 1125)  tetracaine (PONTOCAINE) 0.5 % ophthalmic solution 2 drop (2 drops Right Eye Given 09/19/19 1125)  sodium chloride 0.9 % bolus 1,000 mL (1,000 mLs Intravenous New Bag/Given 09/19/19 1134)  fluorescein ophthalmic strip 1 strip (1 strip Left Eye Given 09/19/19 1125)  tetracaine (PONTOCAINE) 0.5 % ophthalmic solution 2 drop (2 drops Left Eye Given 09/19/19 1125)    ED Course  I have reviewed the triage vital signs and the nursing notes.  Pertinent labs & imaging results that were available during my care of the patient were reviewed by me and considered in my medical decision making (see chart for details).    MDM Rules/Calculators/A&P                      BP (!) 178/108   Pulse 98   Temp 97.8 F (36.6 C) (Oral)   Resp 16   Ht 5\' 3"  (1.6 m)   Wt 59 kg   SpO2 99%   BMI 23.03 kg/m   Final Clinical Impression(s) / ED Diagnoses Final diagnoses:  Vision loss of left eye  Hypoglycemia  Hypokalemia  Generalized weakness    Rx / DC Orders ED Discharge Orders    None     11:39 AM Patient has significant history of diabetes, rhinitis planets, here with acute onset of left eye pain redness and loss of vision for the past 3 days.  Patient report a foreign body sensation in his eye with pain with eye movement, on exam, no evidence of corneal abrasion or foreign body noted.  Left conjunctiva is injected and edematous with evidence of chemosis but no obvious hypopyon or hyphema.  Pupil was 4 mm, dilated and fixed.  Significant diminishing visual acuity only able to detect some motion only.  Intraocular pressure of left eye is 29 which I did obtain twice.  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  Denies any infectious symptoms. Last took oral DM medication yesterday morning.  We will consult ophthalmology for recommendation, will also obtain CT of left orbit with contrast.  Care discussed with DR. Ray.   12:02 PM I have consulted ophthalmolist Dr. Posey Pronto who request pt to be seen in his office at 2:30 today for further exam.  He does not recommend CT scan of the orbits.    Unfortunately, patient's potassium is 2.7, in the setting of generalized weakness, and hypokalemia with worsening renal function with a creatinine of 3.34, will consult for admission, IV fluid given, potassium supplementation ordered.  1:13 PM Appreciate consultation from [facility] Dr. Tamala Julian who agrees to see and admit pt for further care.  Ophthalmologist Dr. Posey Pronto will see pt tomorrow.   Christopher Davila was evaluated in Emergency Department on 09/19/2019 for the symptoms described in the history of present illness. He was evaluated in the context of the global COVID-19 pandemic, which necessitated consideration that the patient might be at risk for infection with the SARS-CoV-2 virus that causes COVID-19. Institutional protocols and algorithms that pertain to the evaluation of patients at risk for COVID-19 are in a state of rapid change based on information released by regulatory bodies including the CDC and federal and state organizations. These policies and algorithms were followed during the patient's care in the ED.    Domenic Moras, PA-C 09/19/19 1425    Domenic Moras, PA-C 09/19/19 1426    Pattricia Boss, MD 09/21/19 [phone]

## 2019-09-19 NOTE — ED Triage Notes (Signed)
Pt bib gce[facility] w/ c/o hypoglycemia and hypertension. Pt also c/o L eye pain. CBG 63 w/ EMS, pt received 2.5g of D10 w/ CBG up to 83. EMS BP 200/130. AOx4

## 2019-09-19 NOTE — ED Notes (Signed)
Unable to complete visual acuity. Pt stated he could not see anything.

## 2019-09-20 LAB — BASIC METABOLIC PANEL
Anion gap: 10 (ref 5–15)
BUN: 26 mg/dL — ABNORMAL HIGH (ref 6–20)
CO2: 22 mmol/L (ref 22–32)
Calcium: 8.2 mg/dL — ABNORMAL LOW (ref 8.9–10.3)
Chloride: 102 mmol/L (ref 98–111)
Creatinine, Ser: 3.44 mg/dL — ABNORMAL HIGH (ref 0.61–1.24)
GFR calc Af Amer: 23 mL/min — ABNORMAL LOW (ref >60)
GFR calc non Af Amer: 20 mL/min — ABNORMAL LOW (ref >60)
Glucose, Bld: 103 mg/dL — ABNORMAL HIGH (ref 70–99)
Potassium: 4 mmol/L (ref 3.5–5.1)
Sodium: 134 mmol/L — ABNORMAL LOW (ref 135–145)

## 2019-09-20 LAB — CBC
HCT: 31.5 % — ABNORMAL LOW (ref 39.0–52.0)
Hemoglobin: 10.3 g/dL — ABNORMAL LOW (ref 13.0–17.0)
MCH: 27.5 pg (ref 26.0–34.0)
MCHC: 32.7 g/dL (ref 30.0–36.0)
MCV: 84 fL (ref 80.0–100.0)
Platelets: 282 10*3/uL (ref 150–400)
RBC: 3.75 MIL/uL — ABNORMAL LOW (ref 4.22–5.81)
RDW: 13.1 % (ref 11.5–15.5)
WBC: 8.8 10*3/uL (ref 4.0–10.5)
nRBC: 0 % (ref 0.0–0.2)

## 2019-09-20 LAB — GLUCOSE, CAPILLARY
Glucose-Capillary: 103 mg/dL — ABNORMAL HIGH (ref 70–99)
Glucose-Capillary: 132 mg/dL — ABNORMAL HIGH (ref 70–99)
Glucose-Capillary: 186 mg/dL — ABNORMAL HIGH (ref 70–99)
Glucose-Capillary: 207 mg/dL — ABNORMAL HIGH (ref 70–99)
Glucose-Capillary: 239 mg/dL — ABNORMAL HIGH (ref 70–99)
Glucose-Capillary: 96 mg/dL (ref 70–99)

## 2019-09-20 MED ORDER — SODIUM CHLORIDE 0.9 % IV SOLN: INTRAVENOUS | Status: AC

## 2019-09-20 NOTE — Progress Notes (Signed)
PROGRESS NOTE    Christopher Davila  CHE:[id number] DOB: [birth year] DOA: 09/19/2019 PCP: Marliss Coots, NP    Brief Narrative:  Christopher Davila is a 49 y.o. male with medical history significant of uncontrolled hypertension , diabetes mellitus type 2 with neuropathy, chronic kidney disease stage IIIb, and right vision loss, presented with complaints of left eye pain over the last 3 days.  Complains of being unable to see out of left eye, but notes that he may be able to see some shadows with light.  At home patient reports that he has intermittently been forgetting to take his medications.  His blood sugars have been reading high when he checks. He had been previously hospitalized in December 2020 for vision loss in the right eye.  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  He took the eyedrops as prescribed, but never followed up thereafter for refills.  In route with EMS patient was noted to have a blood sugar of 60 and was given 2.5 g of D10 with initial blood pressures elevated 200/130.  Repeat blood glucose 83.  ED Course: In the ED patient was afebrile, heart rate 95-103, blood pressures 177/115-183/122, and all other vital signs maintained.  Left intraocular pressure was noted to be 29 mgHg. Dr. Posey Pronto of ophthalmology was consulted and have requested patient to be sent to this office immediately.  Labs significant for WBC 11.6, hemoglobin 10.7, sodium 133, potassium 2.7, BUN 26, creatinine 3.34, and glucose 81.   Following lab work [facility] was called to admit the patient..  Dr. Posey Pronto was notified and he mentioned he will see the patient.  Patient having given fentanyl 25 mcg, 1 L normal saline fluids, 10 mEq of potassium chloride, and 40 mEq of potassium chloride p.o.   Assessment & Plan:   Principal Problem:   Vision loss, left eye Active Problems:   Type 2 diabetes mellitus with hypoglycemia  (HCC)   Hypertensive urgency   Hypokalemia   Acute kidney injury superimposed on chronic kidney disease (HCC)   Hyponatremia   Homeless   Anemia of chronic disease  Left eye pain and vision loss: Acute.  Patient previously had vision loss of the right eye and appears secondary to neurovascular glaucoma with proliferative diabetic retinopathy.  It appears he did not follow-up after initial treatments.  Ophthalmology Dr. Jalene Mullet consulted.  Suspect similar cause of symptoms. -Patient says he was seen by Dr. Posey Pronto of ophthalmology and says that he was told that ophthalmology will come by again later this evening to see him.  Acute kidney injury superimposed on chronic kidney disease stage III: Patient presents with creatinine of 3.34 with BUN 26.  Baseline creatinine previously noted to be around 2.8-3.   Patient had been ordered 2 L of normal saline IV fluids.  Suspect secondary to uncontrolled hypertension.  -Continue to counsel need to avoid NSAIDs -Recheck kidney function in a.m. -Give additional IV fluids if dehydration thought to be because of patient's symptoms  Hypertensive urgency: Acute.  Blood pressures reported to be elevated up to 200/130.  Home blood pressure medications include amlodipine 10 mg daily, hydralazine 25 mg 3 times daily, Coreg 25 mg daily, and Lasix 40 mg as needed for edema(reported to have not started this yet). -Coreg switched to 25 mg twice daily.  Patient had been taking only once daily. -Suspect he may be noncompliant with medications which is the reason why his  blood pressure was really elevated at the time of admission.  - Continue to monitor blood pressure closely.  Continue amlodipine and hydralazine.  Adjust as needed. -Hydralazine IV as needed  Hypokalemia: Acute initial potassium noted to be 2.7.  Patient had been given 10 mEq of potassium - -Continue to monitor and place as needed  Diabetes mellitus type 2 with hypoglycemia: Acute.  Patient  home medication regimen includes glipizide 20 mg twice daily.  He had also been recently started on Januvia 50 mg daily, but reported that he had not started this medication yet.  Initial blood glucose was noted to be around 60 upon EMS arrival.  Last hemoglobin A1c on 06/16/2019 was noted to be 7.6. -Suspect hypoglycemia may be secondary to worsening renal failure.  Hypoglycemic protocols -Check hemoglobin A1c -Gabapentin switch from 300 mg 3 times daily to twice daily -Hold glipizide as dose may need to be adjusted -CBGs every 4 hours  Leukocytosis: WBC elevated 11.6.  Suspect this is possibly reactive to the above.  He is afebrile at this time. -WBC today 8.8.  Mild hyponatremia: Acute.  Sodium 133 glucose within normal limits.  -Sodium this morning 134.  HFpEF: Last EF noted to be 55 to 60% with pseudonormalization in 01/2019.  No signs of patient appearing to be acutely fluid overloaded at this time. -Continue to monitor  Anemia of chronic kidney disease: Hemoglobin 10.7 which appears near patient's baseline. -Continue to monitor  Homelessness: His cousin reports that the patient is homeless and will likely need placement. -Transitions of care consult   DVT prophylaxis: Heparin Code Status: Full Family Communication: No family at bedside. Unable to reach at this time Disposition Plan: Possible discharge home in 1-2 Consults called: Ophthalmology Dr. Posey Pronto   Subjective: He is c/o vision loss left eye. Mild headache.  Objective: Vitals:   09/19/19 2149 09/20/19 0453 09/20/19 0858 09/20/19 1635  BP: 120/88 (!) 141/97 (!) 153/101 120/77  Pulse: 82 91 99 92  Resp: 18 17 18 18   Temp: 97.6 F (36.4 C) 98.3 F (36.8 C) 98 F (36.7 C) 98.3 F (36.8 C)  TempSrc: Oral Oral Oral Oral  SpO2: 97% 95% 92% 94%  Weight:      Height:        Intake/Output Summary (Last 24 hours) at 09/20/2019 2002 Last data filed at 09/20/2019 1800 Gross per 24 hour  Intake 840 ml  Output 0  ml  Net 840 ml   Filed Weights   09/19/19 1053 09/19/19 1600 09/19/19 2012  Weight: 59 kg 63 kg 64.2 kg    Examination: General exam: Awake and oriented, does not appear to be in any acute distress at this time HEENT: Atraumatic, normocephalic, conjunctival injection left eye, decreased vision left eye, diminished vision right eye, chronic, no ear or nose lesions.  Neck: Supple. Respiratory system: Clear to auscultation. Respiratory effort normal. Cardiovascular system: S1 & S2 heard, RRR. No JVD, murmurs, rubs, gallops or clicks. No pedal edema. Gastrointestinal system: Abdomen is nondistended, soft and nontender. No organomegaly or masses felt. Normal bowel sounds heard. Central nervous system: Alert and oriented.  Diminished vision right eye chronic, now with decreased vision left eye, no other focal neurological deficits. Extremities: Symmetric 5 x 5 power. Skin: No rashes Psychiatry: Judgement and insight appear normal. Mood & affect appropriate.     Data Reviewed: I have personally reviewed following labs and imaging studies  CBC: Recent Labs  Lab 09/19/19 1131 09/20/19 0401  WBC 11.6* 8.8  NEUTROABS 8.8*  --   HGB 10.7* 10.3*  HCT 31.9* 31.5*  MCV 83.3 84.0  PLT 294 202   Basic Metabolic Panel: Recent Labs  Lab 09/19/19 1131 09/20/19 0401  NA 133* 134*  K 2.7* 4.0  CL 97* 102  CO2 22 22  GLUCOSE 74 103*  BUN 26* 26*  CREATININE 3.34* 3.44*  CALCIUM 8.4* 8.2*   GFR: Estimated Creatinine Clearance: 21.4 mL/min (A) (by C-G formula based on SCr of 3.44 mg/dL (H)). Liver Function Tests: Recent Labs  Lab 09/19/19 1131  AST 27  ALT 17  ALKPHOS 76  BILITOT 0.5  PROT 6.7  ALBUMIN 2.4*   Recent Labs  Lab 09/19/19 1131  LIPASE 25   No results for input(s): AMMONIA in the last 168 hours. Coagulation Profile: No results for input(s): INR, PROTIME in the last 168 hours. Cardiac Enzymes: No results for input(s): CKTOTAL, CKMB, CKMBINDEX, TROPONINI in the  last 168 hours. BNP (last 3 results) No results for input(s): PROBNP in the last 8760 hours. HbA1C: Recent Labs    09/19/19 1131  HGBA1C 6.4*   CBG: Recent Labs  Lab 09/19/19 2009 09/20/19 0033 09/20/19 0453 09/20/19 1126 09/20/19 1637  GLUCAP 80 103* 96 132* 207*   Lipid Profile: No results for input(s): CHOL, HDL, LDLCALC, TRIG, CHOLHDL, LDLDIRECT in the last 72 hours. Thyroid Function Tests: No results for input(s): TSH, T4TOTAL, FREET4, T3FREE, THYROIDAB in the last 72 hours. Anemia Panel: No results for input(s): VITAMINB12, FOLATE, FERRITIN, TIBC, IRON, RETICCTPCT in the last 72 hours. Sepsis Labs: No results for input(s): PROCALCITON, LATICACIDVEN in the last 168 hours.  Recent Results (from the past 240 hour(s))  SARS CORONAVIRUS 2 (TAT 6-24 HRS) Nasopharyngeal Nasopharyngeal Swab     Status: None   Collection Time: 09/19/19 12:55 PM   Specimen: Nasopharyngeal Swab  Result Value Ref Range Status   SARS Coronavirus 2 NEGATIVE NEGATIVE Final    Comment: (NOTE) SARS-CoV-2 target nucleic acids are NOT DETECTED. The SARS-CoV-2 RNA is generally detectable in upper and lower respiratory specimens during the acute phase of infection. Negative results do not preclude SARS-CoV-2 infection, do not rule out co-infections with other pathogens, and should not be used as the sole basis for treatment or other patient management decisions. Negative results must be combined with clinical observations, patient history, and epidemiological information. The expected result is Negative. Fact Sheet for Patients: SugarRoll.be Fact Sheet for Healthcare Providers: https://www.woods-mathews.com/ This test is not yet approved or cleared by the [place] FDA and  has been authorized for detection and/or diagnosis of SARS-CoV-2 by FDA under an Emergency Use Authorization (EUA). This EUA will remain  in effect (meaning this test can be used) for  the duration of the COVID-19 declaration under Section 56 4(b)(1) of the Act, 21 U.S.C. section 360bbb-3(b)(1), unless the authorization is terminated or revoked sooner. Performed at [facility], [place] [address], [place], [place] [address]          Radiology Studies: No results found.      Scheduled Meds: . amLODipine  10 mg Oral Daily  . aspirin EC  81 mg Oral Daily  . brimonidine  1 drop Left Eye BID  . carvedilol  25 mg Oral BID WC  . dorzolamide-timolol  1 drop Left Eye BID  . gabapentin  300 mg Oral BID  . heparin  5,000 Units Subcutaneous Q8H  . hydrALAZINE  25 mg Oral TID  . latanoprost  1 drop Left Eye  QHS   Continuous Infusions:   LOS: 0 days    Yaakov Guthrie, MD [facility]   To contact the attending provider between 7A-7P or the covering provider during after hours 7P-7A, please log into the web site [email or web address] and access using universal [place] password for that web site. If you do not have the password, please call the hospital operator.  09/20/2019, 8:02 PM

## 2019-09-20 NOTE — Progress Notes (Signed)
Initial Nutrition Assessment  RD working remotely.  DOCUMENTATION CODES:   Not applicable  INTERVENTION:   -Magic cup TID with meals, each supplement provides 290 kcal and 9 grams of protein -Feeding assistance with meals  NUTRITION DIAGNOSIS:   Inadequate oral intake related to other (see comment)(visual impairment) as evidenced by meal completion < 50%.  GOAL:   Patient will meet greater than or equal to 90% of their needs  MONITOR:   PO intake, Supplement acceptance, Weight trends, Skin, Labs, I & O's  REASON FOR ASSESSMENT:   Malnutrition Screening Tool    ASSESSMENT:   Christopher Davila is a 49 y.o. male with medical history significant of uncontrolled hypertension , diabetes mellitus type 2 with neuropathy, chronic kidney disease stage IIIb, and right vision loss, presented with complaints of left eye pain over the last 3 days.  Complains of being unable to see out of left eye, but notes that he may be able to see some shadows with light.  At home patient reports that he has intermittently been forgetting to take his medications.  His blood sugars have just been reading high when he checks.  Today he ate some fruit and took a nap and when he woke up out of his sleep was sweating and had severe pain in his left eye.  Patient had been previously hospitalized in December 2020 for vision loss in the right eye.  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  He took the eyedrops as prescribed, but never followed up thereafter for refills.  In route with EMS patient was noted to have a blood sugar of 60 and was given 2.5 g of D10 with initial blood pressures elevated 200/130.  Repeat blood glucose 83.  Pt admitted with lt eye pain and vision loss (possibly secondary to diabetic retinopathy).   Reviewed I/O's: +420 ml x 24 hours  Attempted to speak with pt via phone, however, no answer.    Pt awaiting opthalmology consult.   Noted meal completion 0-50%.   Reviewed wt hx; wt has been stable over the past 6 months.  Lab Results  Component Value Date   HGBA1C 6.4 (H) 09/19/2019   PTA DM medications are 5 mg glipizide daily and 50 mg sitagliptin daily.   Labs reviewed: Na: 134, CBGS: 80-132 (inpatient orders for glycemic control are none).   Diet Order:   Diet Order            Diet heart healthy/carb modified Room service appropriate? Yes; Fluid consistency: Thin  Diet effective now              EDUCATION NEEDS:   No education needs have been identified at this time  Skin:  Skin Assessment: Reviewed RN Assessment  Last BM:  09/19/19  Height:   Ht Readings from Last 1 Encounters:  09/19/19 5\' 3"  (1.6 m)    Weight:   Wt Readings from Last 1 Encounters:  09/19/19 64.2 kg    Ideal Body Weight:  56.4 kg  BMI:  Body mass index is 25.07 kg/m.  Estimated Nutritional Needs:   Kcal:  6378-5885  Protein:  85-100 grams  Fluid:  1.7-1.9 L    Loistine Chance, RD, LDN, [place] Registered Dietitian II Certified Diabetes Care and Education Specialist Please refer to [facility] for RD and/or RD on-call/weekend/after hours pager

## 2019-09-21 DIAGNOSIS — D631 Anemia in chronic kidney disease: Secondary | ICD-10-CM | POA: Diagnosis present

## 2019-09-21 DIAGNOSIS — E11649 Type 2 diabetes mellitus with hypoglycemia without coma: Secondary | ICD-10-CM | POA: Diagnosis present

## 2019-09-21 DIAGNOSIS — H42 Glaucoma in diseases classified elsewhere: Secondary | ICD-10-CM | POA: Diagnosis present

## 2019-09-21 DIAGNOSIS — I5032 Chronic diastolic (congestive) heart failure: Secondary | ICD-10-CM | POA: Diagnosis present

## 2019-09-21 DIAGNOSIS — M898X9 Other specified disorders of bone, unspecified site: Secondary | ICD-10-CM | POA: Diagnosis present

## 2019-09-21 DIAGNOSIS — E871 Hypo-osmolality and hyponatremia: Secondary | ICD-10-CM | POA: Diagnosis present

## 2019-09-21 DIAGNOSIS — Z20822 Contact with and (suspected) exposure to covid-19: Secondary | ICD-10-CM | POA: Diagnosis present

## 2019-09-21 DIAGNOSIS — E1140 Type 2 diabetes mellitus with diabetic neuropathy, unspecified: Secondary | ICD-10-CM | POA: Diagnosis present

## 2019-09-21 DIAGNOSIS — N1832 Chronic kidney disease, stage 3b: Secondary | ICD-10-CM | POA: Diagnosis present

## 2019-09-21 DIAGNOSIS — H4051X0 Glaucoma secondary to other eye disorders, right eye, stage unspecified: Secondary | ICD-10-CM | POA: Diagnosis present

## 2019-09-21 DIAGNOSIS — H5462 Unqualified visual loss, left eye, normal vision right eye: Secondary | ICD-10-CM | POA: Diagnosis present

## 2019-09-21 DIAGNOSIS — Z590 Homelessness: Secondary | ICD-10-CM | POA: Diagnosis not present

## 2019-09-21 DIAGNOSIS — I130 Hypertensive heart and chronic kidney disease with heart failure and stage 1 through stage 4 chronic kidney disease, or unspecified chronic kidney disease: Secondary | ICD-10-CM | POA: Diagnosis present

## 2019-09-21 DIAGNOSIS — K3184 Gastroparesis: Secondary | ICD-10-CM | POA: Diagnosis present

## 2019-09-21 DIAGNOSIS — E785 Hyperlipidemia, unspecified: Secondary | ICD-10-CM | POA: Diagnosis present

## 2019-09-21 DIAGNOSIS — N179 Acute kidney failure, unspecified: Secondary | ICD-10-CM | POA: Diagnosis present

## 2019-09-21 DIAGNOSIS — E876 Hypokalemia: Secondary | ICD-10-CM | POA: Diagnosis not present

## 2019-09-21 DIAGNOSIS — E872 Acidosis: Secondary | ICD-10-CM | POA: Diagnosis present

## 2019-09-21 DIAGNOSIS — E1143 Type 2 diabetes mellitus with diabetic autonomic (poly)neuropathy: Secondary | ICD-10-CM | POA: Diagnosis present

## 2019-09-21 DIAGNOSIS — E1122 Type 2 diabetes mellitus with diabetic chronic kidney disease: Secondary | ICD-10-CM | POA: Diagnosis present

## 2019-09-21 DIAGNOSIS — R32 Unspecified urinary incontinence: Secondary | ICD-10-CM | POA: Diagnosis present

## 2019-09-21 DIAGNOSIS — E113591 Type 2 diabetes mellitus with proliferative diabetic retinopathy without macular edema, right eye: Secondary | ICD-10-CM | POA: Diagnosis present

## 2019-09-21 DIAGNOSIS — I160 Hypertensive urgency: Secondary | ICD-10-CM | POA: Diagnosis present

## 2019-09-21 DIAGNOSIS — H40052 Ocular hypertension, left eye: Secondary | ICD-10-CM | POA: Diagnosis present

## 2019-09-21 DIAGNOSIS — E1139 Type 2 diabetes mellitus with other diabetic ophthalmic complication: Secondary | ICD-10-CM | POA: Diagnosis not present

## 2019-09-21 LAB — GLUCOSE, CAPILLARY
Glucose-Capillary: 177 mg/dL — ABNORMAL HIGH (ref 70–99)
Glucose-Capillary: 156 mg/dL — ABNORMAL HIGH (ref 70–99)
Glucose-Capillary: 170 mg/dL — ABNORMAL HIGH (ref 70–99)
Glucose-Capillary: 186 mg/dL — ABNORMAL HIGH (ref 70–99)
Glucose-Capillary: 221 mg/dL — ABNORMAL HIGH (ref 70–99)

## 2019-09-21 LAB — BASIC METABOLIC PANEL
Anion gap: 11 (ref 5–15)
BUN: 39 mg/dL — ABNORMAL HIGH (ref 6–20)
CO2: 19 mmol/L — ABNORMAL LOW (ref 22–32)
Calcium: 8.2 mg/dL — ABNORMAL LOW (ref 8.9–10.3)
Chloride: 102 mmol/L (ref 98–111)
Creatinine, Ser: 4.16 mg/dL — ABNORMAL HIGH (ref 0.61–1.24)
GFR calc Af Amer: 18 mL/min — ABNORMAL LOW (ref >60)
GFR calc non Af Amer: 16 mL/min — ABNORMAL LOW (ref >60)
Glucose, Bld: 180 mg/dL — ABNORMAL HIGH (ref 70–99)
Potassium: 3.9 mmol/L (ref 3.5–5.1)
Sodium: 132 mmol/L — ABNORMAL LOW (ref 135–145)

## 2019-09-21 LAB — CBC
HCT: 29.8 % — ABNORMAL LOW (ref 39.0–52.0)
Hemoglobin: 9.8 g/dL — ABNORMAL LOW (ref 13.0–17.0)
MCH: 28 pg (ref 26.0–34.0)
MCHC: 32.9 g/dL (ref 30.0–36.0)
MCV: 85.1 fL (ref 80.0–100.0)
Platelets: 270 10*3/uL (ref 150–400)
RBC: 3.5 MIL/uL — ABNORMAL LOW (ref 4.22–5.81)
RDW: 13.2 % (ref 11.5–15.5)
WBC: 9.9 10*3/uL (ref 4.0–10.5)
nRBC: 0 % (ref 0.0–0.2)

## 2019-09-21 MED ORDER — SODIUM BICARBONATE 650 MG PO TABS
650.0000 mg | ORAL_TABLET | Freq: Two times a day (BID) | ORAL | Status: DC
  Administered 2019-09-21 – 2019-09-22 (×3): 650 mg via ORAL
  Filled 2019-09-21 (×3): qty 1

## 2019-09-21 MED ORDER — ZOLPIDEM TARTRATE 5 MG PO TABS
5.0000 mg | ORAL_TABLET | Freq: Every evening | ORAL | Status: DC | PRN
  Administered 2019-09-22 (×2): 5 mg via ORAL
  Filled 2019-09-21 (×2): qty 1

## 2019-09-21 MED ORDER — ACETAZOLAMIDE 250 MG PO TABS
250.0000 mg | ORAL_TABLET | Freq: Two times a day (BID) | ORAL | Status: DC
  Administered 2019-09-22: 250 mg via ORAL
  Filled 2019-09-21 (×2): qty 1

## 2019-09-21 MED ORDER — AMLODIPINE BESYLATE 5 MG PO TABS
5.0000 mg | ORAL_TABLET | Freq: Every day | ORAL | Status: DC
  Administered 2019-09-22 – 2019-09-23 (×2): 5 mg via ORAL
  Filled 2019-09-21 (×2): qty 1

## 2019-09-21 NOTE — Consult Note (Signed)
Christopher Davila                                                                               09/19/19                                               Ophthalmology Consultation                                         Consult requested by: Dr. Norval Morton MD - [facility] Reason for consultation:  Left eye pain  HPI: Patient admitted to hospital for compromised kidney function with left eye pain.  He is 'no light perception' in the right eye.  Likely from neovascular glaucoma.  He states the pain came on 2 days prior to his visit to the ED.  He has not been compliant with his medications.  He was previously seen at [facility] for neovascular glaucoma and proliferative diabetic retinopathy and did not follow up due to financial reasons.    Pertinent Medical History:   Active Ambulatory Problems    Diagnosis Date Noted  . Type 2 diabetes mellitus with hypoglycemia ([place]) 06/26/2012  . Hypertension 06/26/2012  . Essential hypertension   . Hyperlipidemia   . Gastroparesis due to DM ([place])   . Heart failure ([place]) 02/13/2019  . Hyperglycemia   . CKD (chronic kidney disease) stage 3, GFR 30-59 ml/min   . Alcohol use   . Hypertensive urgency 04/17/2019  . Hypokalemia 04/17/2019  . Normocytic anemia 04/17/2019  . Acute loss of vision, right 06/16/2019   Resolved Ambulatory Problems    Diagnosis Date Noted  . Pancreatitis 06/26/2012  . Weight loss 06/26/2012  . Oral candidiasis 06/26/2012  . Abdominal pain 12/28/2012  . Chest pain 02/14/2014  . Nausea   . Dyspnea   . Epigastric abdominal pain   . Hypoxia   . Wheezing   . Acute kidney injury superimposed on chronic kidney disease ([place])   . Acute renal failure superimposed on stage 3 chronic kidney disease ([place]) 04/17/2019  . Hypoalbuminemia 04/17/2019   Past Medical History:  Diagnosis Date  . Diabetes mellitus   . Neuropathy of lower extremity         Past Medical History:  Diagnosis Date  . Abdominal pain 12/28/2012  . Acute  kidney injury superimposed on chronic kidney disease ([place])   . Chest pain 02/14/2014  . Diabetes mellitus   . Dyspnea   . Epigastric abdominal pain   . Gastroparesis due to DM ([place])   . Hypertension   . Hypoalbuminemia 04/17/2019  . Hypoxia   . Nausea   . Neuropathy of lower extremity   . Oral candidiasis 06/26/2012  . Pancreatitis 06/2012  . Pancreatitis 06/26/2012  . Weight loss 06/26/2012  . Wheezing    Allergies  Allergen Reactions  . Morphine And Related Itching and Other (See Comments)    Pt prefers not to be given this drug  Family History  Problem Relation Age of Onset  . Diabetes Mother   . Lung disease Mother   . Hypertension Mother   . Diabetes Maternal Aunt   . CAD Maternal Aunt   . CAD Cousin      Pertinent Ophthalmic History: Neovascular glaucoma and Proliferative diabetic retinopathy  Current Eye Medications: none  Systemic medications on admission:   Medications Prior to Admission  Medication Sig Dispense Refill  . amLODipine (NORVASC) 10 MG tablet Take 1 tablet (10 mg total) by mouth daily. 30 tablet 2  . aspirin EC 81 MG tablet Take 1 tablet (81 mg total) by mouth daily.    . carvedilol (COREG) 25 MG tablet Take 1 tablet (25 mg total) by mouth 2 (two) times daily with a meal. (Patient taking differently: Take 25 mg by mouth 2 (two) times daily with a meal. Taking once daily) 60 tablet 0  . cyclobenzaprine (FLEXERIL) 10 MG tablet Take 10 mg by mouth at bedtime as needed for muscle spasms.    Marland Kitchen gabapentin (NEURONTIN) 300 MG capsule Take 1 capsule (300 mg total) by mouth 3 (three) times daily. 90 capsule 0  . glipiZIDE (GLUCOTROL) 10 MG tablet Take 20 mg by mouth 2 (two) times daily.    . hydrALAZINE (APRESOLINE) 25 MG tablet Take 1 tablet (25 mg total) by mouth 3 (three) times daily. 90 tablet 1  . ibuprofen (ADVIL) 800 MG tablet Take 800 mg by mouth 3 (three) times daily as needed for pain.    . potassium chloride SA (KLOR-CON)  20 MEQ tablet Take 2 tablets (40 mEq total) by mouth daily. (Patient taking differently: Take 40 mEq by mouth daily as needed. ) 60 tablet 0  . atorvastatin (LIPITOR) 80 MG tablet Take 1 tablet (80 mg total) by mouth daily. (Patient not taking: Reported on 09/19/2019) 30 tablet 0  . LASIX 40 MG tablet Take 40 mg by mouth daily as needed for edema.    . sitaGLIPtin (JANUVIA) 50 MG tablet Take 1 tablet (50 mg total) by mouth daily. (Patient not taking: Reported on 07/07/2019) 30 tablet 0   Review of Systems  Constitutional: Positive for diaphoresis and malaise/fatigue. Negative for fever.  HENT: Negative for congestion and ear discharge.   Eyes: Positive for blurred vision, photophobia, pain and redness.  Respiratory: Negative for shortness of breath.   Gastrointestinal: Positive for nausea. Negative for abdominal pain, diarrhea and vomiting.  Genitourinary: Negative for dysuria and hematuria.  Musculoskeletal: Negative for falls.  Neurological: Positive for headaches. Negative for focal weakness and loss of consciousness.  Psychiatric/Behavioral: Negative for memory loss and substance abuse.   Visual Fields: bare perception at 6"   Pupils:  Dilated on examination  Near acuity:  NLP OD, HM at 6" OS  TA:      OS: 39 mmhg  by Tonopen     Dilation:  Tropicamide OS    External:      OS:  Mild periorbital edema  Anterior segment exam:  By penlight     Conjunctiva:     OS:  3+ injection  Cornea:      OS: Cornea appears hazy with epithelial irregularity  Anterior Chamber:      OS:  Quiet, no obvious hyphema  Iris:       OS:  Difficult view at bedside to determine if NVI present  Lens:       OS:  NS      Optic disc:    OS:  No optic disc details visible  Macula    OS:  Very hazy view, retina details not visible   Impression:  Ocular hypertension likely secondary to neovascular glaucoma. Given presentation and history OD, patient may have severe proliferative  diabetic retinopathy OS  Recommendations/Plan: Start topical therapy to lower IOP and reevaluate when IOP improved.  Cosopt BID, Travatan QHS, Alphagan BID - Newer agents not available in hospital pharmacy.  I've discussed these findings with the Hospitalist.  Will recheck for decreased IOP's.  Diamox not started due to kidney function compromise.  Jalene Mullet, MD

## 2019-09-21 NOTE — Progress Notes (Signed)
NP called to speak with Dr. Posey Pronto, ophtho. Called him. Per ophtho, pt needs to be placed on Diamox for increased pressure in the eye and possibly transferred tomorrow to a tertiary hospital for care. NP spoke with pharmacy due to pt's AKI. Given the pressure in the eye is of high concern, will start Diamox, but at a decreased dose. Dr. Posey Pronto stressed the use of Diamox is an urgent matter for pt. Spoke back to Dr. Posey Pronto and will have attending call him first thing in am to discuss case and need for TF.  KJKG, NP [facility]

## 2019-09-21 NOTE — Progress Notes (Signed)
Patient ID: Christopher Davila, male   DOB: 1970/11/15, 49 y.o.   MRN: [id number]  Christopher Davila                                                                               09/21/2019                                                Progress Note  Ophthalmology Consultation                                         Patient notes vision not improved.  He denies any decreased vision.  He also notes pain in left eye decreased.  Pertinent Medical History:   Active Ambulatory Problems    Diagnosis Date Noted  . Type 2 diabetes mellitus with hypoglycemia ([place]) 06/26/2012  . Hypertension 06/26/2012  . Essential hypertension   . Hyperlipidemia   . Gastroparesis due to DM ([place])   . Heart failure ([place]) 02/13/2019  . Hyperglycemia   . CKD (chronic kidney disease) stage 3, GFR 30-59 ml/min   . Alcohol use   . Hypertensive urgency 04/17/2019  . Hypokalemia 04/17/2019  . Normocytic anemia 04/17/2019  . Acute loss of vision, right 06/16/2019   Resolved Ambulatory Problems    Diagnosis Date Noted  . Pancreatitis 06/26/2012  . Weight loss 06/26/2012  . Oral candidiasis 06/26/2012  . Abdominal pain 12/28/2012  . Chest pain 02/14/2014  . Nausea   . Dyspnea   . Epigastric abdominal pain   . Hypoxia   . Wheezing   . Acute kidney injury superimposed on chronic kidney disease ([place])   . Acute renal failure superimposed on stage 3 chronic kidney disease ([place]) 04/17/2019  . Hypoalbuminemia 04/17/2019   Past Medical History:  Diagnosis Date  . Diabetes mellitus   . Neuropathy of lower extremity     Pertinent Ophthalmic History: Neovascular Glaucoma  Current Eye Medications: Travatan QHS, Alphagan and Cosopt BID   Systemic medications on admission:   Medications Prior to Admission  Medication Sig Dispense Refill  . amLODipine (NORVASC) 10 MG tablet Take 1 tablet (10 mg total) by mouth daily. 30 tablet 2  . aspirin EC 81 MG tablet Take 1 tablet (81 mg total) by mouth daily.    . carvedilol (COREG) 25  MG tablet Take 1 tablet (25 mg total) by mouth 2 (two) times daily with a meal. (Patient taking differently: Take 25 mg by mouth 2 (two) times daily with a meal. Taking once daily) 60 tablet 0  . cyclobenzaprine (FLEXERIL) 10 MG tablet Take 10 mg by mouth at bedtime as needed for muscle spasms.    Marland Kitchen gabapentin (NEURONTIN) 300 MG capsule Take 1 capsule (300 mg total) by mouth 3 (three) times daily. 90 capsule 0  . glipiZIDE (GLUCOTROL) 10 MG tablet Take 20 mg by mouth 2 (two) times daily.    . hydrALAZINE (APRESOLINE) 25 MG tablet Take 1 tablet (25 mg  total) by mouth 3 (three) times daily. 90 tablet 1  . ibuprofen (ADVIL) 800 MG tablet Take 800 mg by mouth 3 (three) times daily as needed for pain.    . potassium chloride SA (KLOR-CON) 20 MEQ tablet Take 2 tablets (40 mEq total) by mouth daily. (Patient taking differently: Take 40 mEq by mouth daily as needed. ) 60 tablet 0  . atorvastatin (LIPITOR) 80 MG tablet Take 1 tablet (80 mg total) by mouth daily. (Patient not taking: Reported on 09/19/2019) 30 tablet 0  . LASIX 40 MG tablet Take 40 mg by mouth daily as needed for edema.    . sitaGLIPtin (JANUVIA) 50 MG tablet Take 1 tablet (50 mg total) by mouth daily. (Patient not taking: Reported on 07/07/2019) 30 tablet 0       Near acuity:  HM OS  TA:       OS: 51 mmhg  by Tonopen     Dilation:  Tropicamide  External:     OS:  Ptosis mild OS  Anterior segment exam:  By penlight     Conjunctiva:  3+ injection  Cornea:       OS: clear, iris details not clearly visible  Anterior Chamber:      OS:  No hyphema  Iris:     OS:  No obvious NVI - difficult to determine due to dark iris and bedside exam  Lens:     OS:  NS     No view of posterior pole - no red reflex, may be due to increased intraocular pressures  Impression:  Worsening ocular hypertension OS in the setting of likely neovascular glaucoma and proliferative diabetic retinopathy  Recommendations/Plan: Discussed with  primary tearm - on call hospitalist - will start Diamox at 1/2 dose 250mg  PO BID.    Discussed with Dr. Midge Aver regarding the possiblity of tube shunt vs cyclophotocoagulation.  Cyclophotocoaguation not available at [facility] - consider transfer to [facility] for evaluation and management.  Will call [place] resident.   Jalene Mullet, MD

## 2019-09-21 NOTE — TOC Initial Note (Signed)
Transition of Care [facility]) - Initial/Assessment Note    Patient Details  Name: Christopher Davila MRN: [id number] Date of Birth: 07-01-1970  [facility]) CM/SW Contact:    Christopher Crews, RN Phone Number: [phone] 09/21/2019, 4:03 PM  Clinical Narrative:                 Spoke with patient at the bedside. PTA had been living with his cousin, Christopher Davila. Patient states that he will discharge to his cousin's, Christopher Davila, home. NCM spoke with Christopher Davila at [phone] to confirm discharge disposition. Christopher Davila states that she or her son will likely be the one that picks patient up at discharge.   Address updated in [place], [place], [place] [address].   Patient PCP is at [facility]   Patient pharmacy is the [facility].   NCM reached out to financial counselor to assist with medicaid application.   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   Christopher Davila stated that the patient was using a cane to get around, but his cane has been lost. Patient would benefit from a new cane.   TOC team following for transition needs.   Expected Discharge Plan: Home/Self Care Barriers to Discharge: Continued Medical Work up   Patient Goals and CMS Choice Patient states their goals for this hospitalization and ongoing recovery are:: return home with cousin, Christopher Davila      Expected Discharge Plan and Services Expected Discharge Plan: Home/Self Care In-house Referral: Financial Counselor, Clinical Social Work Discharge Planning Services: CM Consult                                          Prior Living Arrangements/Services   Lives with:: Self, Relatives Patient language and need for interpreter reviewed:: Yes        Need for Family Participation in Patient Care: Yes (Comment) Care giver support system in place?: Yes (comment)   Criminal Activity/Legal Involvement Pertinent to Current  Situation/Hospitalization: No - Comment as needed  Activities of Daily Living Home Assistive Devices/Equipment: None ADL Screening (condition at time of admission) Patient's cognitive ability adequate to safely complete daily activities?: Yes Is the patient deaf or have difficulty hearing?: No Does the patient have difficulty seeing, even when wearing glasses/contacts?: Yes Does the patient have difficulty concentrating, remembering, or making decisions?: No Patient able to express need for assistance with ADLs?: Yes Does the patient have difficulty dressing or bathing?: Yes Independently performs ADLs?: No Communication: Independent Dressing (OT): Needs assistance Is this a change from baseline?: Change from baseline, expected to last <3days Grooming: Needs assistance Is this a change from baseline?: Change from baseline, expected to last <3 days Feeding: Needs assistance Is this a change from baseline?: Change from baseline, expected to last <3 days Bathing: Needs assistance Is this a change from baseline?: Change from baseline, expected to last <3 days Toileting: Needs assistance Is this a change from baseline?: Change from baseline, expected to last <3 days In/Out Bed: Needs assistance Is this a change from baseline?: Change from baseline, expected to last <3 days Walks in Home: Needs assistance Is this a change from baseline?: Change from baseline, expected to last <3 days Does the patient have difficulty walking or climbing stairs?: Yes Weakness of Legs: Both Weakness of Arms/Hands: None  Permission  Sought/Granted Permission sought to share information with : Family Supports Permission granted to share information with : Yes, Verbal Permission Granted  Share Information with NAME: Christopher Davila     Permission granted to share info w Relationship: cousin  Permission granted to share info w Contact Information: [phone]  Emotional Assessment Appearance:: Appears  stated age Attitude/Demeanor/Rapport: Engaged Affect (typically observed): Accepting Orientation: : Oriented to Self, Oriented to  Time, Oriented to Place, Oriented to Situation Alcohol / Substance Use: Not Applicable Psych Involvement: No (comment)  Admission diagnosis:  Hypokalemia [E87.6] Hypoglycemia [E16.2] Vision loss of left eye [H54.62] Generalized weakness [R53.1] Acute kidney injury superimposed on chronic kidney disease ([place]) [N17.9, N18.9] Patient Active Problem List   Diagnosis Date Noted  . Vision loss of left eye 09/21/2019  . Acute kidney injury superimposed on chronic kidney disease ([place]) 09/19/2019  . Hyponatremia 09/19/2019  . Homeless 09/19/2019  . Anemia of chronic disease 09/19/2019  . Vision loss, left eye 09/19/2019  . Acute loss of vision, right 06/16/2019  . Hypertensive urgency 04/17/2019  . Hypokalemia 04/17/2019  . Normocytic anemia 04/17/2019  . Heart failure ([place]) 02/13/2019  . Hyperglycemia   . CKD (chronic kidney disease) stage 3, GFR 30-59 ml/min   . Alcohol use   . Gastroparesis due to DM ([place])   . Essential hypertension   . Hyperlipidemia   . Type 2 diabetes mellitus with hypoglycemia ([place]) 06/26/2012  . Hypertension 06/26/2012   PCP:  Marliss Coots, NP Pharmacy:   [facility], [address] [place] [address] Phone: [phone] Fax: [phone]     Social Determinants of Health (SDOH) Interventions    Readmission Risk Interventions Readmission Risk Prevention Plan 02/19/2019  Transportation Screening Complete  PCP or Specialist Appt within 5-7 Days Complete  Home Care Screening Complete  Medication Review (RN CM) Complete  Some recent data might be hidden

## 2019-09-21 NOTE — Consult Note (Signed)
[facility] Renal Consultation Note    Indication for Consultation:  Worsening renal failure, anemia of chronic disease  CVE:LFYBOF, Christopher Muscat, NP  HPI: Christopher Davila is a 49 y.o. male. 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 Christopher Davila presented to the ED 4/4 with left eye pain, nausea, and headache x 3 days. Patient was lost to follow up with OP ophthamology at [facility] and reports that he ran out of eyedrops. Noted to be hypertensive to 200/130 as well.   This afternoon he notes that his vision is about the same, reporting a large shadow covering the left eye. He admits to feeling depressed given his near complete vision loss over the past 4 months. Patient reports missing BP meds x 2 days (amlodipine, hydralazine, carvedilol). Most recent BP 101/69. He notes being diagnosed with diabetes over 10 years ago. He endorses a family history or HTN/DMT2 and notes that his sister was on dialysis at time of death. Patient aware that his kidney function had been worsening for some time. He believes he had a kidney biopsy last year but cannot recall where or the subsequent results (see below). He reports one occurrence of urination today which is normal for him. He ate the entirety of his lunch and reports good fluid intake. He denies dysuria, frequency, or urgency. He endorses dull L-sided headache which he attributes to his L eye. Patient denies confusion, dizziness, further change in vision, CP, SOB, anorexia, and N/V/D/C.   It was previously documented that the patient was homeless. Patient notes that he recently moved in with his cousin Christopher Davila. He feels safe in this living situation and adds that she keeps an eye on him. He has other cousins and nieces/nephews in the area.      Past Medical History:  Diagnosis Date  . Abdominal pain 12/28/2012  . Acute kidney injury superimposed on chronic kidney disease ([place])   . Chest pain 02/14/2014  . Diabetes mellitus   . Dyspnea   . Epigastric abdominal pain   . Gastroparesis due to DM ([place])   . Hypertension   . Hypoalbuminemia 04/17/2019  . Hypoxia   . Nausea   . Neuropathy of lower extremity   . Oral candidiasis 06/26/2012  . Pancreatitis 06/2012  . Pancreatitis 06/26/2012  . Weight loss 06/26/2012  . Wheezing    History reviewed. No pertinent surgical history. Family History  Problem Relation Age of Onset  . Diabetes Mother   . Lung disease Mother   . Hypertension Mother   . Diabetes Maternal Aunt   . CAD Maternal Aunt   . CAD Cousin    Social History:  reports that he quit smoking about 4 years ago. His smoking use included cigarettes. He smoked 0.00 packs per day. He has never used smokeless tobacco. He reports previous alcohol use. He reports that he does not use drugs. Allergies  Allergen Reactions  . Morphine And Related Itching and Other (See Comments)    Pt prefers not to be given this drug   Prior to Admission medications   Medication Sig Start Date End Date Taking? Authorizing Provider  amLODipine (NORVASC) 10 MG tablet Take 1 tablet (10 mg total) by mouth daily. 06/17/19  Yes Jose Persia, MD  aspirin EC 81 MG tablet Take 1 tablet (81 mg total) by mouth daily. 05/24/15  Yes  Ghimire, Henreitta Leber, MD  carvedilol (COREG) 25 MG tablet Take 1 tablet (25 mg total) by mouth 2 (two) times daily with a meal. Patient taking differently: Take 25 mg by mouth 2 (two) times daily with a meal. Taking once daily 02/19/19  Yes Gladys Damme, MD  cyclobenzaprine (FLEXERIL) 10 MG tablet Take 10 mg by mouth at bedtime as needed for muscle spasms. 08/18/19  Yes [provider]  gabapentin (NEURONTIN) 300 MG capsule Take 1 capsule (300 mg total) by mouth 3 (three) times daily. 02/19/19  Yes Gladys Damme,  MD  glipiZIDE (GLUCOTROL) 10 MG tablet Take 20 mg by mouth 2 (two) times daily. 08/18/19  Yes [provider]  hydrALAZINE (APRESOLINE) 25 MG tablet Take 1 tablet (25 mg total) by mouth 3 (three) times daily. 06/17/19 09/19/19 Yes Jose Persia, MD  ibuprofen (ADVIL) 800 MG tablet Take 800 mg by mouth 3 (three) times daily as needed for pain. 08/18/19  Yes [provider]  potassium chloride SA (KLOR-CON) 20 MEQ tablet Take 2 tablets (40 mEq total) by mouth daily. Patient taking differently: Take 40 mEq by mouth daily as needed.  04/18/19 09/19/19 Yes Pahwani, Einar Grad, MD  atorvastatin (LIPITOR) 80 MG tablet Take 1 tablet (80 mg total) by mouth daily. Patient not taking: Reported on 09/19/2019 02/19/19   Gladys Damme, MD  LASIX 40 MG tablet Take 40 mg by mouth daily as needed for edema. 08/18/19   [provider]  sitaGLIPtin (JANUVIA) 50 MG tablet Take 1 tablet (50 mg total) by mouth daily. Patient not taking: Reported on 07/07/2019 04/18/19 05/18/19  Darliss Cheney, MD   Current Facility-Administered Medications  Medication Dose Route Frequency Provider Last Rate Last Admin  . 0.9 %  sodium chloride infusion   Intravenous Continuous Matcha, Anupama, MD 75 mL/hr at 09/20/19 2218 New Bag at 09/20/19 2218  . acetaminophen (TYLENOL) tablet 650 mg  650 mg Oral Q6H PRN Fuller Plan A, MD   650 mg at 09/19/19 1855   Or  . acetaminophen (TYLENOL) suppository 650 mg  650 mg Rectal Q6H PRN Smith, Rondell A, MD      . albuterol (PROVENTIL) (2.5 MG/3ML) 0.083% nebulizer solution 2.5 mg  2.5 mg Nebulization Q6H PRN Smith, Rondell A, MD      . amLODipine (NORVASC) tablet 10 mg  10 mg Oral Daily Smith, Rondell A, MD   10 mg at 09/21/19 0830  . aspirin EC tablet 81 mg  81 mg Oral Daily Fuller Plan A, MD   81 mg at 09/21/19 0829  . brimonidine (ALPHAGAN) 0.2 % ophthalmic solution 1 drop  1 drop Left Eye BID Jalene Mullet, MD   1 drop at 09/21/19 [phone]  . carvedilol (COREG) tablet 25 mg  25 mg  Oral BID WC Smith, Rondell A, MD   25 mg at 09/21/19 0830  . cyclobenzaprine (FLEXERIL) tablet 10 mg  10 mg Oral QHS PRN Fuller Plan A, MD      . dorzolamide-timolol (COSOPT) 22.3-6.8 MG/ML ophthalmic solution 1 drop  1 drop Left Eye BID Jalene Mullet, MD   1 drop at 09/21/19 0834  . gabapentin (NEURONTIN) capsule 300 mg  300 mg Oral BID Fuller Plan A, MD   300 mg at 09/21/19 0829  . heparin injection 5,000 Units  5,000 Units Subcutaneous Q8H Fuller Plan A, MD   5,000 Units at 09/21/19 [phone]  . hydrALAZINE (APRESOLINE) injection 10 mg  10 mg Intravenous Q4H PRN Norval Morton, MD      .  hydrALAZINE (APRESOLINE) tablet 25 mg  25 mg Oral TID Fuller Plan A, MD   25 mg at 09/21/19 0830  . HYDROcodone-acetaminophen (NORCO/VICODIN) 5-325 MG per tablet 1 tablet  1 tablet Oral Q6H PRN Norval Morton, MD   1 tablet at 09/21/19 0931  . latanoprost (XALATAN) 0.005 % ophthalmic solution 1 drop  1 drop Left Eye Glenford Peers, MD   1 drop at 09/20/19 2124  . ondansetron (ZOFRAN) tablet 4 mg  4 mg Oral Q6H PRN Fuller Plan A, MD   4 mg at 09/19/19 2010   Or  . ondansetron (ZOFRAN) injection 4 mg  4 mg Intravenous Q6H PRN Norval Morton, MD       Labs: Basic Metabolic Panel: Recent Labs  Lab 09/19/19 1131 09/20/19 0401 09/21/19 0350  NA 133* 134* 132*  K 2.7* 4.0 3.9  CL 97* 102 102  CO2 22 22 19*  GLUCOSE 74 103* 180*  BUN 26* 26* 39*  CREATININE 3.34* 3.44* 4.16*  CALCIUM 8.4* 8.2* 8.2*   Liver Function Tests: Recent Labs  Lab 09/19/19 1131  AST 27  ALT 17  ALKPHOS 76  BILITOT 0.5  PROT 6.7  ALBUMIN 2.4*   Recent Labs  Lab 09/19/19 1131  LIPASE 25   No results for input(s): AMMONIA in the last 168 hours. CBC: Recent Labs  Lab 09/19/19 1131 09/20/19 0401 09/21/19 0350  WBC 11.6* 8.8 9.9  NEUTROABS 8.8*  --   --   HGB 10.7* 10.3* 9.8*  HCT 31.9* 31.5* 29.8*  MCV 83.3 84.0 85.1  PLT 294 282 270   Cardiac Enzymes: No results for input(s): CKTOTAL,  CKMB, CKMBINDEX, TROPONINI in the last 168 hours. CBG: Recent Labs  Lab 09/20/19 2009 09/20/19 2358 09/21/19 0417 09/21/19 0709 09/21/19 1103  GLUCAP 239* 186* 177* 156* 170*   Iron Studies: No results for input(s): IRON, TIBC, TRANSFERRIN, FERRITIN in the last 72 hours. Studies/Results: No results found.  ROS: All others negative except those listed in HPI.  Physical Exam: Vitals:   09/20/19 1635 09/20/19 2008 09/21/19 0419 09/21/19 0844  BP: 120/77 112/82 (!) 134/93 136/90  Pulse: 92 87 94 90  Resp: 18 18 16 18   Temp: 98.3 F (36.8 C) 98.1 F (36.7 C) 98.5 F (36.9 C)   TempSrc: Oral Oral Oral   SpO2: 94% 93% 93% 96%  Weight:  62.9 kg    Height:         General: Pleasant male resting in hospital bed, NAD Head: NCAT sclera not icteric MMM Neck: Supple. No lymphadenopathy Lungs: Normal respiratory rate. No increased work of breathing or use of accessory muscles.  Heart: Appears well perfused. No lifts, heaves, or thrills.  Abdomen: Soft, nontender and nondistended. No guarding or rebound tenderness. M/S:  Equal strength b/l in upper and lower extremities.  Lower extremities: No edema, ischemic changes, or open wounds. Pulses 2+ bilaterally.  Neuro: AAOx3. Moves all extremities spontaneously. Psych:  Responds to questions appropriately with a normal affect. Dialysis Access: None   Assessment/Plan: 1. Acute on chronic kidney disease secondary to diabetic nephropathy in the setting of borderline hypertensive emergency - Cr on presentation 3.34 with baseline estimated at 2.8-3.1. Cr trending up at 4.16 today. BUN up to 39 from 26 at admission. Alb 2.4. Ca 8.4 (9.68 corrected). - UA on admission clear, yellow positive for glucose and >=300 protein. No RBCs/casts.  - Random urine Na 42, urine Cr 107.5 - Per chart, patient with known diabetic nephropathy.  Renal Bx performed 02/18/2019 revealing nodular glomerular sclerosis following previous admission for abdominal pain,  hyperglycemia, and swelling. No discrete immune complex deposits seen. Set up with OP nephrology -- unclear if he followed up.  - Avoid nephrotoxic agents including NSAIDs, ACEIs, IV contrast.  - Monitor UOP closely for oliguria/anuria.  - Continue supportive care, IVF -- will follow along  2. Anemia of CKD - Hgb 10.7 on admission down to 9.8 today. Baseline 2 mo ago was 11. - No evidence of GIB at this time. Patient denies dizziness, fatigue, SOB, or palpitations. - Transfuse vs. ESA should Hgb drop to 7-8 - Order iron studies  3. Hypertensive urgency - BP reported to be elevated up to 200/130 on admission. Patient was not adherent to medication regimen. Home BP meds resumed and Coreg increased to 25 mg BID by primary team with IV hydralazine as needed.   4. Hypokalemia -- resolved - K 2.7 on admission. Repleted orally per primary team. K up to 3.9 today (baseline). - Continue to monitor and replete is <3.5  5. Mild hyponatremia - 132 today. Patient appears euvolemic. Continue IV fluids.   6. DMT2 10+ year history. Manage per primary team.   7. Left eye pain and vision loss  Ophthalmology consulted. Likely related to long-standing HTN and DMT2.    Thank you for the consult. The nephrology service will continue to follow.    Marisa Sprinkles, PA-S2 [facility]

## 2019-09-21 NOTE — Progress Notes (Signed)
PROGRESS NOTE    Christopher Davila  EXB:[id number] DOB: 02-05-1971 DOA: 09/19/2019 PCP: Marliss Coots, NP   Brief Narrative:  Christopher Johnsonis a 49 y.o.malewith medical history significant ofuncontrolled hypertension , diabetes mellitus type 2 with neuropathy, chronic kidney disease stage IIIb, and right vision loss, presented with complaints of left eye pain over the last 3 days prior to presentation. Complains of being unable to see out of left eye, but notes that he may be able to see some shadows with light. At home patient reports that he has intermittently been forgetting to take his medications. His blood sugars have been reading high when he checks. He had been previously hospitalized in December 2020 for vision loss in the right eye. 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 He took the eyedrops as prescribed, but never followed up thereafter for refills due to financial issues. In route with EMS patient was noted to have a blood sugar of 60 and was given 2.5 g of D10 with initial blood pressures elevated 200/130. Repeat blood glucose 83.  ED Course:In the ED patient was afebrile, heart rate 95-103, blood pressures 177/115-183/122, and all other vital signs maintained. Left intraocular pressure was noted to be 29 mgHg.Dr. Posey Pronto of ophthalmology was consulted. Labs significant for WBC 11.6, hemoglobin 10.7, sodium 133, potassium 2.7, BUN 26, creatinine 3.34, andglucose 81.  Assessment & Plan:   Principal Problem:   Vision loss, left eye Active Problems:   Type 2 diabetes mellitus with hypoglycemia (HCC)   Hypertensive urgency   Hypokalemia   Acute kidney injury superimposed on chronic kidney disease (HCC)   Hyponatremia   Homeless   Anemia of chronic disease   Vision loss of left eye   Left eye pain and vision loss: Acute. Patient previously had vision loss of the  right eye and appears secondary to neurovascular glaucoma with proliferative diabetic retinopathy. It appears he did not follow-up after initial treatments. Ophthalmology Dr. Garfield Cornea consulted.  -Discussed with Dr. Posey Pronto over the phone.  He evaluated the patient in the hospital and started him on eye drops.    Acute kidney injury superimposed on chronic kidney disease stage III: Patient presented with creatinine of 3.34 with BUN 26. Baseline creatinine previously noted to be around 2.8-3.  Worsened likely secondary to uncontrolled hypertension. He was given IV fluids.  counseled on avoiding NSAIDs -His creatinine has been worsening.  Consulted nephrology.  Hypertensive urgency: Blood pressures reported to be elevated up to 200/130. Home blood pressure medications include amlodipine 10 mg daily, hydralazine 25 mg 3 times daily, Coreg 25 mg daily, and Lasix 40 mg as needed for edema(reported to have not started this yet). -Coreg switched to 25 mg twice daily. Patient had been taking only once daily. -Suspect he may be noncompliant with medications which is the reason why his blood pressure was really elevated at the time of admission.  - Continue to monitor blood pressure closely.  Continue amlodipine and hydralazine.  Adjust medications as needed. -Hydralazine IV as needed  Hypokalemia: Acute initial potassium noted to be 2.7. Patient had been given 10 mEq of potassium - -Continue to monitor and place as needed  Diabetes mellitus type 2 with hypoglycemia: Acute. Patient home medication regimen includes glipizide 20 mg twice daily. He had also been recently started on Januvia 50 mg daily, but reported that he had not started this medication yet. Initial blood glucose was noted to be  around 60 upon EMS arrival. Last hemoglobin A1c on 06/16/2019 was noted to be 7.6. -Suspect hypoglycemia may be secondary to worsening renal failure.  Hypoglycemic protocols -Hemoglobin A1c  6.4. -Gabapentin switched from 300 mg 3 times daily to twice daily due to worsening renal failure. -Hold glipizide -Monitor CBGs closely.  Leukocytosis: WBC elevated 11.6. Suspect this is possibly reactive to the above.  He is afebrile at this time. -WBC today normal.  Mild hyponatremia: Monitor.  HFpEF: Last EF noted to be 55 to 60% with pseudonormalization in 01/2019. No signs of patient appearing to be acutely fluid overloaded at this time. -Continue to monitor  Anemia of chronic kidney disease: Hemoglobin 9.8 today.  Baseline between 9 and 11. -Continue to monitor  Homelessness: His cousin reports that the patient is homeless and will likely need placement? -Transitions of care consult   DVT prophylaxis:Heparin Code Status:Full Family Communication:No family at bedside.  Disposition Plan:Possible discharge home in 1-2 days Consults called:Ophthalmology Dr. Posey Pronto   Subjective: He continues to complain of left eye visual loss.  Mild headache.  Does not report any other neurologic deficits.  Objective: Vitals:   09/20/19 2008 09/21/19 0419 09/21/19 0844 09/21/19 1403  BP: 112/82 (!) 134/93 136/90 101/69  Pulse: 87 94 90 86  Resp: 18 16 18 18   Temp: 98.1 F (36.7 C) 98.5 F (36.9 C)  97.6 F (36.4 C)  TempSrc: Oral Oral  Oral  SpO2: 93% 93% 96% 94%  Weight: 62.9 kg     Height:        Intake/Output Summary (Last 24 hours) at 09/21/2019 1917 Last data filed at 09/21/2019 1834 Gross per 24 hour  Intake 1138.15 ml  Output 2 ml  Net 1136.15 ml   Filed Weights   09/19/19 1600 09/19/19 2012 09/20/19 2008  Weight: 63 kg 64.2 kg 62.9 kg    Examination: General exam: Awake and oriented, not in any acute distress at this time HEENT: Atraumatic, normocephalic, conjunctival injection left eye, decreased vision left eye, diminished vision right eye, chronic, no ear or nose lesions.  Neck: Supple. Respiratory system: Clear to auscultation. Respiratory effort  normal. Cardiovascular system: S1 & S2 heard, RRR. No JVD, murmurs, rubs, gallops or clicks. No pedal edema. Gastrointestinal system: Abdomen is nondistended, soft and nontender. No organomegaly or masses felt. Normal bowel sounds heard. Central nervous system: Alert and oriented.  Diminished vision right eye chronic, now with decreased vision left eye, no other focal neurological deficits. Extremities: Symmetric 5 x 5 power. Skin: No rashes Psychiatry: Judgement and insight appear normal. Mood & affect appropriate.     Data Reviewed: I have personally reviewed following labs and imaging studies  CBC: Recent Labs  Lab 09/19/19 1131 09/20/19 0401 09/21/19 0350  WBC 11.6* 8.8 9.9  NEUTROABS 8.8*  --   --   HGB 10.7* 10.3* 9.8*  HCT 31.9* 31.5* 29.8*  MCV 83.3 84.0 85.1  PLT 294 282 263   Basic Metabolic Panel: Recent Labs  Lab 09/19/19 1131 09/20/19 0401 09/21/19 0350  NA 133* 134* 132*  K 2.7* 4.0 3.9  CL 97* 102 102  CO2 22 22 19*  GLUCOSE 74 103* 180*  BUN 26* 26* 39*  CREATININE 3.34* 3.44* 4.16*  CALCIUM 8.4* 8.2* 8.2*   GFR: Estimated Creatinine Clearance: 17.7 mL/min (A) (by C-G formula based on SCr of 4.16 mg/dL (H)). Liver Function Tests: Recent Labs  Lab 09/19/19 1131  AST 27  ALT 17  ALKPHOS 76  BILITOT 0.5  PROT 6.7  ALBUMIN 2.4*   Recent Labs  Lab 09/19/19 1131  LIPASE 25   No results for input(s): AMMONIA in the last 168 hours. Coagulation Profile: No results for input(s): INR, PROTIME in the last 168 hours. Cardiac Enzymes: No results for input(s): CKTOTAL, CKMB, CKMBINDEX, TROPONINI in the last 168 hours. BNP (last 3 results) No results for input(s): PROBNP in the last 8760 hours. HbA1C: Recent Labs    09/19/19 1131  HGBA1C 6.4*   CBG: Recent Labs  Lab 09/20/19 2358 09/21/19 0417 09/21/19 0709 09/21/19 1103 09/21/19 1602  GLUCAP 186* 177* 156* 170* 186*   Lipid Profile: No results for input(s): CHOL, HDL, LDLCALC, TRIG,  CHOLHDL, LDLDIRECT in the last 72 hours. Thyroid Function Tests: No results for input(s): TSH, T4TOTAL, FREET4, T3FREE, THYROIDAB in the last 72 hours. Anemia Panel: No results for input(s): VITAMINB12, FOLATE, FERRITIN, TIBC, IRON, RETICCTPCT in the last 72 hours. Sepsis Labs: No results for input(s): PROCALCITON, LATICACIDVEN in the last 168 hours.  Recent Results (from the past 240 hour(s))  SARS CORONAVIRUS 2 (TAT 6-24 HRS) Nasopharyngeal Nasopharyngeal Swab     Status: None   Collection Time: 09/19/19 12:55 PM   Specimen: Nasopharyngeal Swab  Result Value Ref Range Status   SARS Coronavirus 2 NEGATIVE NEGATIVE Final    Comment: (NOTE) SARS-CoV-2 target nucleic acids are NOT DETECTED. The SARS-CoV-2 RNA is generally detectable in upper and lower respiratory specimens during the acute phase of infection. Negative results do not preclude SARS-CoV-2 infection, do not rule out co-infections with other pathogens, and should not be used as the sole basis for treatment or other patient management decisions. Negative results must be combined with clinical observations, patient history, and epidemiological information. The expected result is Negative. Fact Sheet for Patients: SugarRoll.be Fact Sheet for Healthcare Providers: https://www.woods-mathews.com/ This test is not yet approved or cleared by the [place] FDA and  has been authorized for detection and/or diagnosis of SARS-CoV-2 by FDA under an Emergency Use Authorization (EUA). This EUA will remain  in effect (meaning this test can be used) for the duration of the COVID-19 declaration under Section 56 4(b)(1) of the Act, 21 U.S.C. section 360bbb-3(b)(1), unless the authorization is terminated or revoked sooner. Performed at [facility], [place] [address], [place], [place] [address]          Radiology Studies: No results found.      Scheduled Meds: . [START ON  09/22/2019] amLODipine  5 mg Oral Daily  . aspirin EC  81 mg Oral Daily  . brimonidine  1 drop Left Eye BID  . carvedilol  25 mg Oral BID WC  . dorzolamide-timolol  1 drop Left Eye BID  . gabapentin  300 mg Oral BID  . heparin  5,000 Units Subcutaneous Q8H  . hydrALAZINE  25 mg Oral TID  . latanoprost  1 drop Left Eye QHS  . sodium bicarbonate  650 mg Oral BID   Continuous Infusions: . sodium chloride 75 mL/hr at 09/20/19 2218     LOS: 0 days    Yaakov Guthrie, MD [facility]   To contact the attending provider between 7A-7P or the covering provider during after hours 7P-7A, please log into the web site [email or web address] and access using universal [place] password for that web site. If you do not have the password, please call the hospital operator.  09/21/2019, 7:17 PM

## 2019-09-22 LAB — RENAL FUNCTION PANEL
Albumin: 2.4 g/dL — ABNORMAL LOW (ref 3.5–5.0)
Anion gap: 11 (ref 5–15)
BUN: 48 mg/dL — ABNORMAL HIGH (ref 6–20)
CO2: 18 mmol/L — ABNORMAL LOW (ref 22–32)
Calcium: 8.4 mg/dL — ABNORMAL LOW (ref 8.9–10.3)
Chloride: 105 mmol/L (ref 98–111)
Creatinine, Ser: 4.43 mg/dL — ABNORMAL HIGH (ref 0.61–1.24)
GFR calc Af Amer: 17 mL/min — ABNORMAL LOW (ref >60)
GFR calc non Af Amer: 15 mL/min — ABNORMAL LOW (ref >60)
Glucose, Bld: 170 mg/dL — ABNORMAL HIGH (ref 70–99)
Phosphorus: 6.4 mg/dL — ABNORMAL HIGH (ref 2.5–4.6)
Potassium: 4.3 mmol/L (ref 3.5–5.1)
Sodium: 134 mmol/L — ABNORMAL LOW (ref 135–145)

## 2019-09-22 LAB — GLUCOSE, CAPILLARY
Glucose-Capillary: 152 mg/dL — ABNORMAL HIGH (ref 70–99)
Glucose-Capillary: 167 mg/dL — ABNORMAL HIGH (ref 70–99)
Glucose-Capillary: 168 mg/dL — ABNORMAL HIGH (ref 70–99)
Glucose-Capillary: 215 mg/dL — ABNORMAL HIGH (ref 70–99)
Glucose-Capillary: 258 mg/dL — ABNORMAL HIGH (ref 70–99)
Glucose-Capillary: 269 mg/dL — ABNORMAL HIGH (ref 70–99)

## 2019-09-22 LAB — CBC
HCT: 30.2 % — ABNORMAL LOW (ref 39.0–52.0)
Hemoglobin: 9.7 g/dL — ABNORMAL LOW (ref 13.0–17.0)
MCH: 28 pg (ref 26.0–34.0)
MCHC: 32.1 g/dL (ref 30.0–36.0)
MCV: 87 fL (ref 80.0–100.0)
Platelets: 249 10*3/uL (ref 150–400)
RBC: 3.47 MIL/uL — ABNORMAL LOW (ref 4.22–5.81)
RDW: 13.2 % (ref 11.5–15.5)
WBC: 9.8 10*3/uL (ref 4.0–10.5)
nRBC: 0 % (ref 0.0–0.2)

## 2019-09-22 MED ORDER — METHAZOLAMIDE 25 MG PO TABS
100.0000 mg | ORAL_TABLET | Freq: Two times a day (BID) | ORAL | Status: AC
  Administered 2019-09-22 – 2019-09-23 (×2): 100 mg via ORAL
  Filled 2019-09-22: qty 2
  Filled 2019-09-22 (×2): qty 4
  Filled 2019-09-22: qty 2
  Filled 2019-09-22: qty 4

## 2019-09-22 MED ORDER — STERILE WATER FOR INJECTION IV SOLN
INTRAVENOUS | Status: DC
  Filled 2019-09-22 (×2): qty 9.71

## 2019-09-22 NOTE — Progress Notes (Signed)
Progress Note    Christopher Davila  OAC:[id number] DOB: 01-10-1971  DOA: 09/19/2019 PCP: Christopher Coots, NP    Brief Narrative:     Medical records reviewed and are as summarized below:  Christopher Davila is an 49 y.o. male with medical history significant of uncontrolled hypertension , diabetes mellitus type 2 with neuropathy, chronic kidney disease stage IIIb, and right vision loss, presented with complaints of left eye pain over the last 3 days.  Complains of being unable to see out of left eye, but notes that he may be able to see some shadows with light.  At home patient reports that he has intermittently been forgetting to take his medications.  His blood sugars have just been reading high when he checks.  Today he ate some fruit and took a nap and when he woke up out of his sleep was sweating and had severe pain in his left eye.  Patient had been previously hospitalized in December 2020 for vision loss in the right eye.  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  He took the eyedrops as prescribed, but never followed up thereafter for refills.   Assessment/Plan:   Principal Problem:   Vision loss, left eye Active Problems:   Type 2 diabetes mellitus with hypoglycemia (HCC)   Hypertensive urgency   Hypokalemia   Acute kidney injury superimposed on chronic kidney disease (HCC)   Hyponatremia   Homeless   Anemia of chronic disease   Vision loss of left eye   Left eye pain and vision loss: Acute.  -Patient previously had vision loss of the right eye and appears secondary to neurovascular glaucoma with proliferative diabetic retinopathy. It appears he did not follow-up after initial treatments.  -Ophthalmology Dr. Garfield Davila consulted: Patient has pressure of 51.  Likely needs a laser procedure that is now available at [facility] and suggest transfer to [facility].  I have called the  transfer line but was told they are delayed at least 48 hours due to long wait list.  Transfer center to have internal medicine call me back. Spoke with Dr. Posey Davila and he suggested starting methazolamide  Acute kidney injury superimposed on chronic kidney disease stage III: It is likely due to hypertensive urgency coupled with diabetic nephropathy (patient has biopsy-proven diabetic nephropathy from prior admission)--never followed up with nephrology -Baseline creatinine previously noted to be around 2.8-3.   -Bladder scan shows 350, will do INO once patient reaches 500 -Nephrology started IV sodium bicarb -Labs in the a.m. -No current need for dialysis  Hypertensive urgency: Blood pressures reported to be elevated up to 200/130. Home blood pressure medications include amlodipine 10 mg daily, hydralazine 25 mg 3 times daily, Coreg 25 mg daily, and Lasix 40 mg as needed for edema(reported to have not started this yet). -Coreg switched to 25 mg twice daily. Patient had been taking only once daily. -Suspect he may be noncompliant with medications which is the reason why his blood pressure was really elevated at the time of admission.  Hypokalemia:  -Has been replaced  Diabetes mellitus type 2 with hypoglycemia: Acute.  -Patient home medication regimen includes glipizide 20 mg twice daily. He had also been recently started on Januvia 50 mg daily, but reported that he had not started this medication yet. Initial blood glucose was noted to be around 60 upon EMS arrival. Last hemoglobin A1c on 06/16/2019 was noted to be 7.6. -  SSI  Leukocytosis:  -No sign of infection -Appears to be reactive as has normalized  Chronic diastolic CHF: Last EF noted to be 55 to 60% with pseudonormalization in 01/2019. No signs of patient appearing to be acutely fluid overloaded at this time. -Continue to monitor  Anemia of chronic kidney disease:  -Continue to monitor  Homelessness: His cousin  reports that the patient is homeless and will likely need placement? -patient denies being homeless to me -he also denies any issues with transportation   Family Communication/Anticipated D/C date and plan/Code Status   DVT prophylaxis: heparin Code Status: Full Code.  Family Communication: called cousin Disposition Plan: Dr. Posey Davila recommending transfer to [facility] (Dr. Edilia Bo) for possible laser procedure for glaucoma   Medical Consultants:   Nephrology Ophthalmology    Subjective:   Patient states his eye is about the same as yesterday  Objective:    Vitals:   09/21/19 2005 09/22/19 0417 09/22/19 0916 09/22/19 1000  BP: 137/89 (!) 142/97 (!) 154/100   Pulse: 96 99 (!) 52 99  Resp: 16 16 18    Temp: 98.6 F (37 C) 98.5 F (36.9 C) 98.4 F (36.9 C)   TempSrc: Oral Oral    SpO2: 92% 92% 92%   Weight: 64.5 kg     Height:        Intake/Output Summary (Last 24 hours) at 09/22/2019 1413 Last data filed at 09/22/2019 1300 Gross per 24 hour  Intake 1060 ml  Output 0 ml  Net 1060 ml   Filed Weights   09/19/19 2012 09/20/19 2008 09/21/19 2005  Weight: 64.2 kg 62.9 kg 64.5 kg    Exam: In bed, no acute distress, alert and oriented x3 Patient has decreased vision in both eyes Regular rate and rhythm Clear to auscultation Positive bowel sounds, soft nontender Judgment and insight normal  Data Reviewed:   I have personally reviewed following labs and imaging studies:  Labs: Labs show the following:   Basic Metabolic Panel: Recent Labs  Lab 09/19/19 1131 09/19/19 1131 09/20/19 0401 09/20/19 0401 09/21/19 0350 09/22/19 0504  NA 133*  --  134*  --  132* 134*  K 2.7*   < > 4.0   < > 3.9 4.3  CL 97*  --  102  --  102 105  CO2 22  --  22  --  19* 18*  GLUCOSE 74  --  103*  --  180* 170*  BUN 26*  --  26*  --  39* 48*  CREATININE 3.34*  --  3.44*  --  4.16* 4.43*  CALCIUM 8.4*  --  8.2*  --  8.2* 8.4*  PHOS  --   --   --   --   --  6.4*   < > = values in  this interval not displayed.   GFR Estimated Creatinine Clearance: 16.6 mL/min (A) (by C-G formula based on SCr of 4.43 mg/dL (H)). Liver Function Tests: Recent Labs  Lab 09/19/19 1131 09/22/19 0504  AST 27  --   ALT 17  --   ALKPHOS 76  --   BILITOT 0.5  --   PROT 6.7  --   ALBUMIN 2.4* 2.4*   Recent Labs  Lab 09/19/19 1131  LIPASE 25   No results for input(s): AMMONIA in the last 168 hours. Coagulation profile No results for input(s): INR, PROTIME in the last 168 hours.  CBC: Recent Labs  Lab 09/19/19 1131 09/20/19 0401 09/21/19 0350 09/22/19 1610  WBC 11.6* 8.8 9.9 9.8  NEUTROABS 8.8*  --   --   --   HGB 10.7* 10.3* 9.8* 9.7*  HCT 31.9* 31.5* 29.8* 30.2*  MCV 83.3 84.0 85.1 87.0  PLT 294 282 270 249   Cardiac Enzymes: No results for input(s): CKTOTAL, CKMB, CKMBINDEX, TROPONINI in the last 168 hours. BNP (last 3 results) No results for input(s): PROBNP in the last 8760 hours. CBG: Recent Labs  Lab 09/21/19 2001 09/22/19 0000 09/22/19 0419 09/22/19 0748 09/22/19 1124  GLUCAP 221* 215* 168* 167* 152*   D-Dimer: No results for input(s): DDIMER in the last 72 hours. Hgb A1c: No results for input(s): HGBA1C in the last 72 hours. Lipid Profile: No results for input(s): CHOL, HDL, LDLCALC, TRIG, CHOLHDL, LDLDIRECT in the last 72 hours. Thyroid function studies: No results for input(s): TSH, T4TOTAL, T3FREE, THYROIDAB in the last 72 hours.  Invalid input(s): FREET3 Anemia work up: No results for input(s): VITAMINB12, FOLATE, FERRITIN, TIBC, IRON, RETICCTPCT in the last 72 hours. Sepsis Labs: Recent Labs  Lab 09/19/19 1131 09/20/19 0401 09/21/19 0350 09/22/19 0504  WBC 11.6* 8.8 9.9 9.8    Microbiology Recent Results (from the past 240 hour(s))  SARS CORONAVIRUS 2 (TAT 6-24 HRS) Nasopharyngeal Nasopharyngeal Swab     Status: None   Collection Time: 09/19/19 12:55 PM   Specimen: Nasopharyngeal Swab  Result Value Ref Range Status   SARS  Coronavirus 2 NEGATIVE NEGATIVE Final    Comment: (NOTE) SARS-CoV-2 target nucleic acids are NOT DETECTED. The SARS-CoV-2 RNA is generally detectable in upper and lower respiratory specimens during the acute phase of infection. Negative results do not preclude SARS-CoV-2 infection, do not rule out co-infections with other pathogens, and should not be used as the sole basis for treatment or other patient management decisions. Negative results must be combined with clinical observations, patient history, and epidemiological information. The expected result is Negative. Fact Sheet for Patients: SugarRoll.be Fact Sheet for Healthcare Providers: https://www.woods-mathews.com/ This test is not yet approved or cleared by the [place] FDA and  has been authorized for detection and/or diagnosis of SARS-CoV-2 by FDA under an Emergency Use Authorization (EUA). This EUA will remain  in effect (meaning this test can be used) for the duration of the COVID-19 declaration under Section 56 4(b)(1) of the Act, 21 U.S.C. section 360bbb-3(b)(1), unless the authorization is terminated or revoked sooner. Performed at [facility], [place] [address] [place], [place] [address]     Procedures and diagnostic studies:  No results found.  Medications:   . amLODipine  5 mg Oral Daily  . aspirin EC  81 mg Oral Daily  . brimonidine  1 drop Left Eye BID  . carvedilol  25 mg Oral BID WC  . dorzolamide-timolol  1 drop Left Eye BID  . gabapentin  300 mg Oral BID  . heparin  5,000 Units Subcutaneous Q8H  . hydrALAZINE  25 mg Oral TID  . latanoprost  1 drop Left Eye QHS  . methazolamide  100 mg Oral BID   Continuous Infusions: .  sodium bicarbonate infusion 1/4 NS 1000 mL       LOS: 1 day   Geradine Girt  [facility]   How to contact the [facility] Attending or Consulting provider [place] or covering provider during after hours [place], for this  patient?  1. Check the care team in [facility] and look for a) attending/consulting [facility] provider listed and b) the [facility] team listed  2. Log into [email or web address] and use [place]'s universal password to access. If you do not have the password, please contact the hospital operator. 3. Locate the [facility] provider you are looking for under [facility] and page to a number that you can be directly reached. 4. If you still have difficulty reaching the provider, please page the [facility] (Director on Call) for the Hospitalists listed on amion for assistance.  09/22/2019, 2:13 PM

## 2019-09-22 NOTE — Progress Notes (Signed)
[facility] Progress Note   Subjective:    Patient is in good spirits this morning. IVF currently not running. He reports one voiding occurrence in the bedside toilet this morning which does not appear to have been recorded. Order placed yesterday for strict I/O, patient agreed to remind nursing staff as well. He notes that ophthalmology came to see him and started him on a medicine for his eye. He states that his vision is the same though the pain is better. He has no concerns this morning. He denies dizziness, new headache, CP, SOB, anorexia, and N/V/D/C.  Objective Vitals:   09/21/19 2005 09/22/19 0417 09/22/19 0916 09/22/19 1000  BP: 137/89 (!) 142/97 (!) 154/100   Pulse: 96 99 (!) 52 99  Resp: 16 16 18    Temp: 98.6 F (37 C) 98.5 F (36.9 C) 98.4 F (36.9 C)   TempSrc: Oral Oral    SpO2: 92% 92% 92%   Weight: 64.5 kg     Height:       Physical Exam General: Pleasant male resting in hospital bed with eyes closed, NAD Heart: RRR S1/S2 distinct. No murmurs, gallops, or rubs. Lungs: CTA BL without adventitious sounds. Normal respiratory effort without use of accessory muscles.  Abdomen: Soft, NT/ND Extremities: No edema. DP pulses 2+ BL. Dialysis Access: None  Filed Weights   09/19/19 2012 09/20/19 2008 09/21/19 2005  Weight: 64.2 kg 62.9 kg 64.5 kg    Intake/Output Summary (Last 24 hours) at 09/22/2019 1018 Last data filed at 09/22/2019 0753 Gross per 24 hour  Intake 1160 ml  Output 0 ml  Net 1160 ml    Additional Objective Labs: Basic Metabolic Panel: Recent Labs  Lab 09/20/19 0401 09/21/19 0350 09/22/19 0504  NA 134* 132* 134*  K 4.0 3.9 4.3  CL 102 102 105  CO2 22 19* 18*  GLUCOSE 103* 180* 170*  BUN 26* 39* 48*  CREATININE 3.44* 4.16* 4.43*  CALCIUM 8.2* 8.2* 8.4*  PHOS  --   --  6.4*   Liver Function Tests: Recent Labs  Lab 09/19/19 1131 09/22/19 0504  AST 27  --   ALT 17  --   ALKPHOS 76  --   BILITOT 0.5  --   PROT 6.7  --    ALBUMIN 2.4* 2.4*   Recent Labs  Lab 09/19/19 1131  LIPASE 25   CBC: Recent Labs  Lab 09/19/19 1131 09/19/19 1131 09/20/19 0401 09/21/19 0350 09/22/19 0504  WBC 11.6*   < > 8.8 9.9 9.8  NEUTROABS 8.8*  --   --   --   --   HGB 10.7*   < > 10.3* 9.8* 9.7*  HCT 31.9*   < > 31.5* 29.8* 30.2*  MCV 83.3  --  84.0 85.1 87.0  PLT 294   < > 282 270 249   < > = values in this interval not displayed.   Blood Culture    Component Value Date/Time   SDES URINE, CLEAN CATCH 06/15/2018 0914   SPECREQUEST NONE 06/15/2018 0914   CULT (A) 06/15/2018 0914    <10,000 COLONIES/mL INSIGNIFICANT GROWTH Performed at [facility], [place] [address] [place], [place] [address]    REPTSTATUS 06/16/2018 FINAL 06/15/2018 0914    Cardiac Enzymes: No results for input(s): CKTOTAL, CKMB, CKMBINDEX, TROPONINI in the last 168 hours. CBG: Recent Labs  Lab 09/21/19 1602 09/21/19 2001 09/22/19 0000 09/22/19 0419 09/22/19 0748  GLUCAP 186* 221* 215* 168* 167*   Iron Studies:  No results for input(s): IRON, TIBC, TRANSFERRIN, FERRITIN in the last 72 hours. Lab Results  Component Value Date   INR 1.0 06/16/2019   INR 1.0 02/18/2019   INR 0.9 12/21/2007   Studies/Results: 09/22/19: Phosphorus 2.5 - 4.6 mg/dL 6.4 High    Medications:  . amLODipine  5 mg Oral Daily  . aspirin EC  81 mg Oral Daily  . brimonidine  1 drop Left Eye BID  . carvedilol  25 mg Oral BID WC  . dorzolamide-timolol  1 drop Left Eye BID  . gabapentin  300 mg Oral BID  . heparin  5,000 Units Subcutaneous Q8H  . hydrALAZINE  25 mg Oral TID  . latanoprost  1 drop Left Eye QHS  . methazolamide  100 mg Oral BID  . sodium bicarbonate  650 mg Oral BID    Assessment/Plan: 49 y.o. male with PMH of DMT2 with PDR/neuropathy/nephropathy and HTN known to [employer]. Previous admission with AKI and nephrotic syndrome. Kidney Bx revealed nodular glomerular sclerosis consistent with diabetic nephropathy. Lost to  follow up. Admitted 4/4 from ED for left eye pain, hypertensive urgency, and now AKI on CKD. Cr on presentation 3.34 with baseline estimated at 2.8-3.1.  1. AKI on CKD secondary to diabetic nephropathy likely hemodynamically mediated due to hypertensive urgency - UA on admission clear, yellow positive for glucose and >=300 protein. No RBCs/casts. Random urine Na 42, urine Cr 107.5. - Cr trending up to 4.43 today. BUN up to 48.  - Strict I/Os. Order placed yesterday. Must record patient UOP.  - Follow up on results of bladder scan.  - Needs to follow OP for CKD. No need for dialysis at this time. Avoid nephrotoxic agents including NSAIDs, ACEIs, fleet enemas, IV contrast.   2. Hypertensive urgency on admission  - BP reported to be elevated up to 200/130 on admission. Patient was not adherent to medication regimen. Home BP meds resumed and Coreg increased to 25 mg BID by primary team with IV hydralazine as needed.  - Last BP this morning 154/100. Consider increase of amlodipine to 7.5 mg PO QD (home dose 10 mg).  3. Metabolic acidosis - CO2 18 today. Downward trending since admission. - Increase sodium bicarb to 650 mg PO TID vs. BID.   4. Anemia of CKD - Hgb 10.7 on admission, stable at 9.7 today. Baseline 2 mo ago was 11. - Hgb at goal. Continue PO iron.   5. CKD-MBD - Phosphorus 6.4 today. Start sevelamer 800 mg PO TID.   6. Hypokalemia and mild hyponatremia -- resolved - K 2.7 on admission. Repleted orally per primary team. K up to 4.3 today. Continue to monitor and replete is <3.5. - Na up to 134 today. Continue gentle IVF given persistent poor kidney function and addition of Diamox to med regimen.   7. DMT2 - 10+ year history. Manage per primary team.   8. Left eye pain/vision loss secondary to neovascular glaucoma and PDR - Ophthalmology consulted. Started patient on 1/2 dose Diamox 250 mg PO BID. Discussing possible tube shunt vs. cyclophotocoagulation at [facility].  - Diamox  is a carbonic anhydrase inhibitor/diuretic which contributes to the loss of fluid in addition to bicarb, Na, and K. Will continue to monitor renal function. Supplementing bicarb as above.    The nephrology service will continue to follow.    Marisa Sprinkles, PA-S2 [facility]

## 2019-09-22 NOTE — Progress Notes (Signed)
MD Eliseo Squires notified of Bladder scan showing 329 mL around 1400. Per MD to bladder scan pt every 6 hours and in and out ordered if >500 mL.   Paulla Fore, RN, BSN

## 2019-09-23 ENCOUNTER — Inpatient Hospital Stay (HOSPITAL_COMMUNITY): Payer: Medicaid Other

## 2019-09-23 LAB — RENAL FUNCTION PANEL
Albumin: 2.2 g/dL — ABNORMAL LOW (ref 3.5–5.0)
Anion gap: 12 (ref 5–15)
BUN: 56 mg/dL — ABNORMAL HIGH (ref 6–20)
CO2: 18 mmol/L — ABNORMAL LOW (ref 22–32)
Calcium: 8.2 mg/dL — ABNORMAL LOW (ref 8.9–10.3)
Chloride: 103 mmol/L (ref 98–111)
Creatinine, Ser: 4.55 mg/dL — ABNORMAL HIGH (ref 0.61–1.24)
GFR calc Af Amer: 17 mL/min — ABNORMAL LOW (ref >60)
GFR calc non Af Amer: 14 mL/min — ABNORMAL LOW (ref >60)
Glucose, Bld: 195 mg/dL — ABNORMAL HIGH (ref 70–99)
Phosphorus: 6.4 mg/dL — ABNORMAL HIGH (ref 2.5–4.6)
Potassium: 4.4 mmol/L (ref 3.5–5.1)
Sodium: 133 mmol/L — ABNORMAL LOW (ref 135–145)

## 2019-09-23 LAB — GLUCOSE, CAPILLARY
Glucose-Capillary: 111 mg/dL — ABNORMAL HIGH (ref 70–99)
Glucose-Capillary: 132 mg/dL — ABNORMAL HIGH (ref 70–99)
Glucose-Capillary: 169 mg/dL — ABNORMAL HIGH (ref 70–99)
Glucose-Capillary: 172 mg/dL — ABNORMAL HIGH (ref 70–99)
Glucose-Capillary: 213 mg/dL — ABNORMAL HIGH (ref 70–99)
Glucose-Capillary: 320 mg/dL — ABNORMAL HIGH (ref 70–99)

## 2019-09-23 MED ORDER — GABAPENTIN 300 MG PO CAPS: 300.0000 mg | ORAL_CAPSULE | Freq: Two times a day (BID) | ORAL | Status: DC

## 2019-09-23 MED ORDER — SODIUM BICARBONATE 650 MG PO TABS
650.0000 mg | ORAL_TABLET | Freq: Three times a day (TID) | ORAL | Status: DC
  Administered 2019-09-23 (×2): 650 mg via ORAL
  Filled 2019-09-23 (×2): qty 1

## 2019-09-23 MED ORDER — BRIMONIDINE TARTRATE 0.2 % OP SOLN: 1.0000 [drp] | Freq: Two times a day (BID) | OPHTHALMIC | 12 refills | Status: DC

## 2019-09-23 MED ORDER — GLIPIZIDE 10 MG PO TABS: 10.0000 mg | ORAL_TABLET | Freq: Two times a day (BID) | ORAL | Status: DC

## 2019-09-23 MED ORDER — METHAZOLAMIDE 25 MG PO TABS
50.0000 mg | ORAL_TABLET | Freq: Two times a day (BID) | ORAL | Status: DC
  Administered 2019-09-23 – 2019-09-24 (×2): 50 mg via ORAL
  Filled 2019-09-23: qty 1
  Filled 2019-09-23: qty 2
  Filled 2019-09-23: qty 1
  Filled 2019-09-23 (×2): qty 2

## 2019-09-23 MED ORDER — INSULIN ASPART 100 UNIT/ML ~~LOC~~ SOLN
0.0000 [IU] | Freq: Three times a day (TID) | SUBCUTANEOUS | Status: DC
  Administered 2019-09-23: 7 [IU] via SUBCUTANEOUS

## 2019-09-23 MED ORDER — BISACODYL 10 MG RE SUPP
10.0000 mg | Freq: Every day | RECTAL | Status: DC | PRN
  Administered 2019-09-23: 10 mg via RECTAL
  Filled 2019-09-23: qty 1

## 2019-09-23 MED ORDER — METHAZOLAMIDE 25 MG PO TABS
100.0000 mg | ORAL_TABLET | Freq: Two times a day (BID) | ORAL | Status: DC
  Filled 2019-09-23: qty 4

## 2019-09-23 MED ORDER — HYDRALAZINE HCL 25 MG PO TABS: 12.5000 mg | ORAL_TABLET | Freq: Three times a day (TID) | ORAL | Status: DC

## 2019-09-23 MED ORDER — LATANOPROST 0.005 % OP SOLN: 1.0000 [drp] | Freq: Every day | OPHTHALMIC | 12 refills | Status: DC

## 2019-09-23 MED ORDER — INSULIN ASPART 100 UNIT/ML ~~LOC~~ SOLN: 0.0000 [IU] | Freq: Every day | SUBCUTANEOUS | Status: DC

## 2019-09-23 MED ORDER — SODIUM BICARBONATE 650 MG PO TABS: 650.0000 mg | ORAL_TABLET | Freq: Three times a day (TID) | ORAL | 0 refills | Status: DC

## 2019-09-23 MED ORDER — CALCIUM ACETATE (PHOS BINDER) 667 MG PO CAPS: 667.0000 mg | ORAL_CAPSULE | Freq: Three times a day (TID) | ORAL | Status: AC

## 2019-09-23 MED ORDER — HYDRALAZINE HCL 25 MG PO TABS
12.5000 mg | ORAL_TABLET | Freq: Three times a day (TID) | ORAL | Status: DC
  Administered 2019-09-23: 12.5 mg via ORAL
  Filled 2019-09-23: qty 1

## 2019-09-23 MED ORDER — FUROSEMIDE 10 MG/ML IJ SOLN
60.0000 mg | Freq: Once | INTRAMUSCULAR | Status: AC
  Administered 2019-09-23: 60 mg via INTRAVENOUS
  Filled 2019-09-23: qty 6

## 2019-09-23 MED ORDER — DORZOLAMIDE HCL-TIMOLOL MAL 2-0.5 % OP SOLN: 1.0000 [drp] | Freq: Two times a day (BID) | OPHTHALMIC | 12 refills | Status: DC

## 2019-09-23 NOTE — Progress Notes (Signed)
Pt voided 300cc in urinal at 2100 last night, pt was incontinent of urine around 2300 had to do full linen change and pt voided in urinal 300cc ,At 0130 pt voided  300cc will continue to monitor

## 2019-09-23 NOTE — Progress Notes (Signed)
Inpatient Diabetes Program Recommendations  AACE/ADA: New Consensus Statement on Inpatient Glycemic Control (2015)  Target Ranges:  Prepandial:   less than 140 mg/dL      Peak postprandial:   less than 180 mg/dL (1-2 hours)      Critically ill patients:  140 - 180 mg/dL   Lab Results  Component Value Date   GLUCAP 132 (H) 09/23/2019   HGBA1C 6.4 (H) 09/19/2019  Results for LLEWYN, HEAP (MRN [id number]) as of 09/23/2019 10:14  Ref. Range 09/22/2019 21:14 09/23/2019 00:40 09/23/2019 04:40 09/23/2019 07:27  Glucose-Capillary Latest Ref Range: 70 - 99 mg/dL 258 (H) 213 (H) 169 (H) 132 (H)    Review of Glycemic Control  Diabetes history: type 2 Outpatient Diabetes medications: Glucotrol 20 mg BID, Januvia (not taking) Current orders for Inpatient glycemic control: none  Inpatient Diabetes Program Recommendations:   Noted that patient's CBGs have been greater than 180 mg/dl. Patient was admitted with lower blood sugars.  Recommend starting Novolog 0-6 units TID (renal) if blood sugars continue to trend greater than 180 mg/dl.   Will continue to monitor blood sugars while in the hospital.   Harvel Ricks RN BSN CDE Diabetes Coordinator Pager: [phone]  8am-5pm

## 2019-09-23 NOTE — Progress Notes (Signed)
Progress Note    Christopher Davila  JKD:[id number] DOB: 1970-06-20  DOA: 09/19/2019 PCP: Marliss Coots, NP    Brief Narrative:     Medical records reviewed and are as summarized below:  Christopher Davila is an 49 y.o. male with medical history significant of uncontrolled hypertension , diabetes mellitus type 2 with neuropathy, chronic kidney disease stage IIIb, and right vision loss, presented with complaints of left eye pain over the last 3 days.  Complains of being unable to see out of left eye, but notes that he may be able to see some shadows with light.  At home patient reports that he has intermittently been forgetting to take his medications.  His blood sugars have just been reading high when he checks.  Today he ate some fruit and took a nap and when he woke up out of his sleep was sweating and had severe pain in his left eye.  Patient had been previously hospitalized in December 2020 for vision loss in the right eye.  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  He took the eyedrops as prescribed, but never followed up thereafter for refills.   Assessment/Plan:   Principal Problem:   Vision loss, left eye Active Problems:   Type 2 diabetes mellitus with hypoglycemia (HCC)   Hypertensive urgency   Hypokalemia   Acute kidney injury superimposed on chronic kidney disease (HCC)   Hyponatremia   Homeless   Anemia of chronic disease   Vision loss of left eye   Left eye pain and vision loss: Acute.  -Patient previously had vision loss of the right eye and appears secondary to neurovascular glaucoma with proliferative diabetic retinopathy. It appears he did not follow-up after initial treatments.  -Ophthalmology Dr. Garfield Cornea consulted: Patient has pressure of 51.  Likely needs a laser procedure that is not available at [facility] and suggest transfer to [facility].   -We will transfer  and nephrology has signed off so will arrange outpatient follow-up for patient emergently in the a.m. Spoke with Dr. Posey Pronto and he suggested starting methazolamide (will need close monitoring while on this medication due to kidneys)  Acute kidney injury superimposed on chronic kidney disease stage III: It is likely due to hypertensive urgency coupled with diabetic nephropathy (patient has biopsy-proven diabetic nephropathy from prior admission)--never followed up with nephrology -Baseline creatinine previously noted to be around 2.8-3.   -Per nephrology no urgent need for dialysis -Patient does appear to be volume overloaded so IV fluids stopped -X-ray of the chest shows mild volume overload  Hypertensive urgency: Blood pressures reported to be elevated up to 200/130. Home blood pressure medications include amlodipine 10 mg daily, hydralazine 12.5 mg 3 times daily, Coreg 25 mg daily, and Lasix 40 mg as needed for edema(reported to have not started this yet). -Coreg switched to 25 mg twice daily. Patient had been taking only once daily. -Suspect he may be noncompliant with medications which is the reason why his blood pressure was really elevated at the time of admission.  Hypokalemia:  -Has been replaced  Diabetes mellitus type 2 with hypoglycemia: Acute.  -Patient home medication regimen includes glipizide 20 mg twice daily. He had also been recently started on Januvia 50 mg daily, but reported that he had not started this medication yet. Initial blood glucose was noted to be around 60 upon EMS arrival. Last hemoglobin A1c on 06/16/2019 was noted to be  7.6. -We will order SSI  Leukocytosis:  -No sign of infection -Appears to be reactive as has normalized  Chronic diastolic CHF: Last EF noted to be 55 to 60% with pseudonormalization in 01/2019.  -Continue to monitor  Anemia of chronic kidney disease:  -Continue to monitor     Family Communication/Anticipated D/C date and  plan/Code Status   DVT prophylaxis: heparin Code Status: Full Code.  Family Communication: Spoke with patient's cousin Disposition Plan: Plan to discharge in the a.m. prior to 7 AM so cousin can pick up and take to [facility] for emergency outpatient visit for severe glaucoma  Medical Consultants:   Nephrology Ophthalmology    Subjective:   Denies any change in his vision today Upon reassessment this afternoon patient complaining of some shortness of breath  Objective:    Vitals:   09/23/19 0440 09/23/19 0730 09/23/19 0930 09/23/19 1054  BP: (!) 145/100 (!) 134/95  131/90  Pulse: 86 87  87  Resp: 18 20    Temp: 97.8 F (36.6 C)  98.2 F (36.8 C)   TempSrc:   Oral   SpO2: 98% 96%  97%  Weight:      Height:        Intake/Output Summary (Last 24 hours) at 09/23/2019 1621 Last data filed at 09/23/2019 0850 Gross per 24 hour  Intake 1076.84 ml  Output 1400 ml  Net -323.16 ml   Filed Weights   09/19/19 2012 09/20/19 2008 09/21/19 2005  Weight: 64.2 kg 62.9 kg 64.5 kg    Exam: In bed, no acute distress Regular rate and rhythm Wheezing and crackles at bases in bilateral lungs A+Ox3 Pleasant and cooperative   Data Reviewed:   I have personally reviewed following labs and imaging studies:  Labs: Labs show the following:   Basic Metabolic Panel: Recent Labs  Lab 09/19/19 1131 09/19/19 1131 09/20/19 0401 09/20/19 0401 09/21/19 0350 09/21/19 0350 09/22/19 0504 09/23/19 0418  NA 133*  --  134*  --  132*  --  134* 133*  K 2.7*   < > 4.0   < > 3.9   < > 4.3 4.4  CL 97*  --  102  --  102  --  105 103  CO2 22  --  22  --  19*  --  18* 18*  GLUCOSE 74  --  103*  --  180*  --  170* 195*  BUN 26*  --  26*  --  39*  --  48* 56*  CREATININE 3.34*  --  3.44*  --  4.16*  --  4.43* 4.55*  CALCIUM 8.4*  --  8.2*  --  8.2*  --  8.4* 8.2*  PHOS  --   --   --   --   --   --  6.4* 6.4*   < > = values in this interval not displayed.   GFR Estimated Creatinine  Clearance: 16.2 mL/min (A) (by C-G formula based on SCr of 4.55 mg/dL (H)). Liver Function Tests: Recent Labs  Lab 09/19/19 1131 09/22/19 0504 09/23/19 0418  AST 27  --   --   ALT 17  --   --   ALKPHOS 76  --   --   BILITOT 0.5  --   --   PROT 6.7  --   --   ALBUMIN 2.4* 2.4* 2.2*   Recent Labs  Lab 09/19/19 1131  LIPASE 25   No results for input(s): AMMONIA in the  last 168 hours. Coagulation profile No results for input(s): INR, PROTIME in the last 168 hours.  CBC: Recent Labs  Lab 09/19/19 1131 09/20/19 0401 09/21/19 0350 09/22/19 0504  WBC 11.6* 8.8 9.9 9.8  NEUTROABS 8.8*  --   --   --   HGB 10.7* 10.3* 9.8* 9.7*  HCT 31.9* 31.5* 29.8* 30.2*  MCV 83.3 84.0 85.1 87.0  PLT 294 282 270 249   Cardiac Enzymes: No results for input(s): CKTOTAL, CKMB, CKMBINDEX, TROPONINI in the last 168 hours. BNP (last 3 results) No results for input(s): PROBNP in the last 8760 hours. CBG: Recent Labs  Lab 09/23/19 0040 09/23/19 0440 09/23/19 0727 09/23/19 1104 09/23/19 1600  GLUCAP 213* 169* 132* 172* 320*   D-Dimer: No results for input(s): DDIMER in the last 72 hours. Hgb A1c: No results for input(s): HGBA1C in the last 72 hours. Lipid Profile: No results for input(s): CHOL, HDL, LDLCALC, TRIG, CHOLHDL, LDLDIRECT in the last 72 hours. Thyroid function studies: No results for input(s): TSH, T4TOTAL, T3FREE, THYROIDAB in the last 72 hours.  Invalid input(s): FREET3 Anemia work up: No results for input(s): VITAMINB12, FOLATE, FERRITIN, TIBC, IRON, RETICCTPCT in the last 72 hours. Sepsis Labs: Recent Labs  Lab 09/19/19 1131 09/20/19 0401 09/21/19 0350 09/22/19 0504  WBC 11.6* 8.8 9.9 9.8    Microbiology Recent Results (from the past 240 hour(s))  SARS CORONAVIRUS 2 (TAT 6-24 HRS) Nasopharyngeal Nasopharyngeal Swab     Status: None   Collection Time: 09/19/19 12:55 PM   Specimen: Nasopharyngeal Swab  Result Value Ref Range Status   SARS Coronavirus 2  NEGATIVE NEGATIVE Final    Comment: (NOTE) SARS-CoV-2 target nucleic acids are NOT DETECTED. The SARS-CoV-2 RNA is generally detectable in upper and lower respiratory specimens during the acute phase of infection. Negative results do not preclude SARS-CoV-2 infection, do not rule out co-infections with other pathogens, and should not be used as the sole basis for treatment or other patient management decisions. Negative results must be combined with clinical observations, patient history, and epidemiological information. The expected result is Negative. Fact Sheet for Patients: SugarRoll.be Fact Sheet for Healthcare Providers: https://www.woods-mathews.com/ This test is not yet approved or cleared by the [place] FDA and  has been authorized for detection and/or diagnosis of SARS-CoV-2 by FDA under an Emergency Use Authorization (EUA). This EUA will remain  in effect (meaning this test can be used) for the duration of the COVID-19 declaration under Section 56 4(b)(1) of the Act, 21 U.S.C. section 360bbb-3(b)(1), unless the authorization is terminated or revoked sooner. Performed at [facility], [place] [address], [place], [place] [address]     Procedures and diagnostic studies:  No results found.  Medications:   . amLODipine  5 mg Oral Daily  . aspirin EC  81 mg Oral Daily  . brimonidine  1 drop Left Eye BID  . calcium acetate  667 mg Oral TID WC  . carvedilol  25 mg Oral BID WC  . dorzolamide-timolol  1 drop Left Eye BID  . gabapentin  300 mg Oral BID  . heparin  5,000 Units Subcutaneous Q8H  . hydrALAZINE  25 mg Oral TID  . latanoprost  1 drop Left Eye QHS  . methazolamide  50 mg Oral BID  . sodium bicarbonate  650 mg Oral TID   Continuous Infusions:    LOS: 2 days   Geradine Girt  [facility]   How to contact the [facility] Attending or Consulting  provider [place] or covering provider during after hours [place], for this patient?  1. Check the care team in [facility] and look for a) attending/consulting [facility] provider listed and b) the [facility] team listed 2. Log into [email or web address] and use [place]'s universal password to access. If you do not have the password, please contact the hospital operator. 3. Locate the [facility] provider you are looking for under [facility] and page to a number that you can be directly reached. 4. If you still have difficulty reaching the provider, please page the [facility] (Director on Call) for the Hospitalists listed on amion for assistance.  09/23/2019, 4:21 PM

## 2019-09-23 NOTE — Progress Notes (Signed)
[facility] Progress Note   Subjective:    Patient doing well this morning. Assisted him with changing his TV channel and finding him socks. He is in good spirits and joked about me getting two cheeseburgers from [place] and that he wouldn't tell anyone. He endorses good appetite and denies uremic symptoms. 900 mL of UOP recorded last night with one episode of reported incontinence. Patient notes that he was attempting to go in the urinal and realized that he had peed on himself. He is awaiting transfer to [facility] for ophthalmic procedure when a bed is available.   He notes that he has not been able to ambulate during his stay. He expresses concern that he would like to walk and strengthen his legs.   Objective: Vitals:   09/22/19 2114 09/23/19 0440 09/23/19 0730 09/23/19 1054  BP: 115/73 (!) 145/100 (!) 134/95 131/90  Pulse: 84 86 87 87  Resp: 18 18 20    Temp: (!) 97.4 F (36.3 C) 97.8 F (36.6 C)    TempSrc: Oral     SpO2: 97% 98% 96% 97%  Weight:      Height:       Physical Exam: General: Pleasant male resting in hospital bed, NAD. Heart: RRR S1/S2 distinct. No murmurs, gallops, or rubs. Lungs: CTA BL without adventitious sounds. Normal respiratory effort without use of accessory muscles.  Abdomen: Soft, NT/ND Extremities: No edema. DP pulses 2+ BL. Dialysis Access: None   Filed Weights   09/19/19 2012 09/20/19 2008 09/21/19 2005  Weight: 64.2 kg 62.9 kg 64.5 kg    Intake/Output Summary (Last 24 hours) at 09/23/2019 1120 Last data filed at 09/23/2019 0850 Gross per 24 hour  Intake 896.84 ml  Output 1400 ml  Net -503.16 ml    Additional Objective Labs: Basic Metabolic Panel: Recent Labs  Lab 09/21/19 0350 09/22/19 0504 09/23/19 0418  NA 132* 134* 133*  K 3.9 4.3 4.4  CL 102 105 103  CO2 19* 18* 18*  GLUCOSE 180* 170* 195*  BUN 39* 48* 56*  CREATININE 4.16* 4.43* 4.55*  CALCIUM 8.2* 8.4* 8.2*  PHOS  --  6.4* 6.4*   Liver Function  Tests: Recent Labs  Lab 09/19/19 1131 09/22/19 0504 09/23/19 0418  AST 27  --   --   ALT 17  --   --   ALKPHOS 76  --   --   BILITOT 0.5  --   --   PROT 6.7  --   --   ALBUMIN 2.4* 2.4* 2.2*   Recent Labs  Lab 09/19/19 1131  LIPASE 25   CBC: Recent Labs  Lab 09/19/19 1131 09/19/19 1131 09/20/19 0401 09/21/19 0350 09/22/19 0504  WBC 11.6*   < > 8.8 9.9 9.8  NEUTROABS 8.8*  --   --   --   --   HGB 10.7*   < > 10.3* 9.8* 9.7*  HCT 31.9*   < > 31.5* 29.8* 30.2*  MCV 83.3  --  84.0 85.1 87.0  PLT 294   < > 282 270 249   < > = values in this interval not displayed.   Blood Culture    Component Value Date/Time   SDES URINE, CLEAN CATCH 06/15/2018 0914   SPECREQUEST NONE 06/15/2018 0914   CULT (A) 06/15/2018 0914    <10,000 COLONIES/mL INSIGNIFICANT GROWTH Performed at [facility], [place] [address] [place], [place] [address]    REPTSTATUS 06/16/2018 FINAL 06/15/2018 0914    Cardiac Enzymes:  No results for input(s): CKTOTAL, CKMB, CKMBINDEX, TROPONINI in the last 168 hours. CBG: Recent Labs  Lab 09/22/19 2114 09/23/19 0040 09/23/19 0440 09/23/19 0727 09/23/19 1104  GLUCAP 258* 213* 169* 132* 172*   Iron Studies: No results for input(s): IRON, TIBC, TRANSFERRIN, FERRITIN in the last 72 hours. Lab Results  Component Value Date   INR 1.0 06/16/2019   INR 1.0 02/18/2019   INR 0.9 12/21/2007   Studies/Results: No results found.  Medications: .  sodium bicarbonate infusion 1/4 NS 1000 mL 100 mL/hr at 09/22/19 2256   . amLODipine  5 mg Oral Daily  . aspirin EC  81 mg Oral Daily  . brimonidine  1 drop Left Eye BID  . carvedilol  25 mg Oral BID WC  . dorzolamide-timolol  1 drop Left Eye BID  . gabapentin  300 mg Oral BID  . heparin  5,000 Units Subcutaneous Q8H  . hydrALAZINE  25 mg Oral TID  . latanoprost  1 drop Left Eye QHS   Assessment/Plan: 49 y.o. male with PMH of DMT2 with PDR/neuropathy/nephropathy and HTN known to [employer]. Previous admission with AKI and nephrotic syndrome. Kidney Bx revealed nodular glomerular sclerosis consistent with diabetic nephropathy. Lost to follow up. Admitted 4/4 from ED for left eye pain, hypertensive urgency, and now AKI on CKD. Cr on presentation 3.34 with baseline estimated at 2.8-3.1. Awaiting transfer to tertiary center for ophthalmic procedure.   1. AKI on CKD secondary to diabetic nephropathy likely hemodynamically mediated due to hypertensive urgency - UA on admission clear, yellow positive for glucose and >=300 protein. No RBCs/casts. Random urine Na 42, urine Cr 107.5. - Cr trending up to 4.55 today.  - Bladder scan yesterday showed 329 mL. Repeat per primary team and I/O cath if >500 mL and patient unable to void. Continue strict I/Os. Will monitor.  - Needs to follow OP for CKD. No need for dialysis at this time. Avoid nephrotoxic agents including NSAIDs, ACEIs, fleet enemas, IV contrast.   2. Hypertensive urgency on admission  - BP reported to be elevated up to 200/130 on admission. Patient was not adherent to medication regimen. Home BP meds resumed and Coreg increased to 25 mg BID by primary team with IV hydralazine as needed.  - Last BP today 131/90. Continue to monitor BP.   3. Metabolic acidosis - CO2 remains 18 today. Initiating IV sodium bicarb today.  4.Anemiaof CKD - Hgb 10.7 on admission, stable at 9.7 today. Baseline 2 mo ago was 11. - Hgb at goal. Continue PO iron.   5. CKD-MBD - Phosphorus 6.4 yesterday. Will initiate PhosLo 1334 mg QD today.   6. Hypokalemia and mild hyponatremia -- resolved - K 2.7 on admission. Repleted orally per primary team. K up to 4.4 today. Continue to monitor and replete is <3.5. - Na stable at 133 today. Continue gentle IVF given persistent poor kidney function and addition of Diamox to med regimen.   7. DMT2 - 10+ year history. Manage per primary team.   8. Left eye pain/vision loss secondary to  neovascular glaucoma and PDR - Ophthalmology consulted. Started patient on 1/2 dose Diamox 250 mg PO BID.  - Elected to start patient on methazolamide which will be better than Diamox on his kidneys. Patient still awaiting transfer to tertiary center.    The nephrology service will continue to follow.   Marisa Sprinkles, PA-S2 [facility]

## 2019-09-23 NOTE — Discharge Summary (Signed)
Physician Discharge Summary  Christopher Davila TDD:[id number] DOB: [birth year] DOA: 09/19/2019  PCP: Marliss Coots, NP  Admit date: 09/19/2019 Discharge date: 09/24/2019  Admitted From: Home Discharge disposition: Home (per cousin patient will stay with her until apartment can be found)   Recommendations for Outpatient Follow-Up:   1. Patient being discharged this a.m. to attend an urgent appointment with ophthalmology at [facility] 2. Patient needs to follow-up with renal as I suspect he is nearing need for hemodialysis 3. BMP in 1 week 4. Titration of medications for blood sugars with special attention paid to his kidney function to avoid hypoglycemia   Discharge Diagnosis:   Principal Problem:   Vision loss, left eye Active Problems:   Type 2 diabetes mellitus with hypoglycemia (HCC)   Hypertensive urgency   Hypokalemia   Acute kidney injury superimposed on chronic kidney disease (HCC)   Hyponatremia   Homeless   Anemia of chronic disease   Vision loss of left eye    Discharge Condition: Improved.  Diet recommendation: Renal/carb mod  Wound care: None.  Code status: Full.   History of Present Illness:   Christopher Davila is a 49 y.o. male with medical history significant of uncontrolled hypertension , diabetes mellitus type 2 with neuropathy, chronic kidney disease stage IIIb, and right vision loss, presented with complaints of left eye pain over the last 3 days.  Complains of being unable to see out of left eye, but notes that he may be able to see some shadows with light.  At home patient reports that he has intermittently been forgetting to take his medications.  His blood sugars have just been reading high when he checks.  Today he ate some fruit and took a nap and when he woke up out of his sleep was sweating and had severe pain in his left eye.  Patient had been previously hospitalized in December 2020 for vision loss in the right eye.  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  He took the eyedrops as prescribed, but never followed up thereafter for refills.  In route with EMS patient was noted to have a blood sugar of 60 and was given 2.5 g of D10 with initial blood pressures elevated 200/130.  Repeat blood glucose 83.   Hospital Course by Problem:   Left eye pain and vision loss: Acute.  -Patient previously had vision loss of the right eye and appears secondary to neurovascular glaucoma with proliferative diabetic retinopathy. It appears he did not follow-up after initial treatments.  -Ophthalmology Dr. Garfield Cornea consulted: Patient has pressure of 51.  Likely needs a laser procedure that is not available at [facility] and suggest transfer to [facility].   -We will transfer and nephrology has signed off so will arrange outpatient follow-up for patient emergently in the a.m. Spoke with Dr. Posey Pronto and he suggested starting methazolamide (will need close monitoring while on this medication due to kidneys)-we will defer further medication changes to ophthalmology with whom patient is seeing today at 9 AM  Acute kidney injury superimposed on chronic kidney disease stage III: It is likely due to hypertensive urgency coupled with diabetic nephropathy (patient has biopsy-proven diabetic nephropathy from prior admission)--never followed up with nephrology -Baseline creatinine previously noted to be around 2.8-3. -Per nephrology no urgent need for dialysis -Patient will need close outpatient follow-up with nephrology, good urine output with 1.4 L on 4/8  Hypertensive urgency: Blood pressures reported to be elevated up to 200/130. Home blood pressure medications include amlodipine 10 mg daily, hydralazine 12.5 mg 3 times daily, Coreg 25 mg daily, and Lasix 40 mg as needed for edema(reported to have not started this yet). -Coreg switched to 25 mg twice  daily. Patient had been taking only once daily. -Suspect he may be noncompliant with medications which is the reason why his blood pressure was really elevated at the time of admission.  Hypokalemia:  -Has been replaced  Diabetes mellitus type 2 with hypoglycemia: Acute.  -Patient home medication regimen includes glipizide 20 mg twice daily. He had also been recently started on Januvia 50 mg daily, but reported that he had not started this medication yet. Initial blood glucose was noted to be around 60 upon EMS arrival. Last hemoglobin A1c on 06/16/2019 was noted to be 7.6. -We will need close monitoring outpatient  Leukocytosis:  -No sign of infection -Appears to be reactive as has normalized  Chronic diastolic CHF: Last EF noted to be 55 to 60% with pseudonormalization in 01/2019.  -Continue to monitor  Anemia of chronic kidney disease:  -Continue to monitor outpatient -Needs close follow-up with nephrology and may require infusions of Procrit    Medical Consultants:    Renal Ophthalmology  Discharge Exam:   Vitals:   09/23/19 2053 09/24/19 0458  BP: 113/69 131/85  Pulse: 87 89  Resp: 18 19  Temp: 98.3 F (36.8 C) (!) 97.4 F (36.3 C)  SpO2: 98% 98%   Vitals:   09/23/19 1054 09/23/19 1636 09/23/19 2053 09/24/19 0458  BP: 131/90 118/86 113/69 131/85  Pulse: 87 87 87 89  Resp:  18 18 19   Temp:   98.3 F (36.8 C) (!) 97.4 F (36.3 C)  TempSrc:   Oral Oral  SpO2: 97% 100% 98% 98%  Weight:      Height:          The results of significant diagnostics from this hospitalization (including imaging, microbiology, ancillary and laboratory) are listed below for reference.     Procedures and Diagnostic Studies:   No results found.   Labs:   Basic Metabolic Panel: Recent Labs  Lab 09/19/19 1131 09/19/19 1131 09/20/19 0401 09/20/19 0401 09/21/19 0350 09/21/19 0350 09/22/19 0504 09/23/19 0418  NA 133*  --  134*  --  132*  --  134* 133*  K  2.7*   < > 4.0   < > 3.9   < > 4.3 4.4  CL 97*  --  102  --  102  --  105 103  CO2 22  --  22  --  19*  --  18* 18*  GLUCOSE 74  --  103*  --  180*  --  170* 195*  BUN 26*  --  26*  --  39*  --  48* 56*  CREATININE 3.34*  --  3.44*  --  4.16*  --  4.43* 4.55*  CALCIUM 8.4*  --  8.2*  --  8.2*  --  8.4* 8.2*  PHOS  --   --   --   --   --   --  6.4* 6.4*   < > = values in this interval not displayed.   GFR Estimated Creatinine Clearance: 16.2 mL/min (A) (by C-G formula based on SCr of 4.55 mg/dL (H)). Liver Function Tests: Recent Labs  Lab 09/19/19 1131 09/22/19 0504 09/23/19 0418  AST 27  --   --  ALT 17  --   --   ALKPHOS 76  --   --   BILITOT 0.5  --   --   PROT 6.7  --   --   ALBUMIN 2.4* 2.4* 2.2*   Recent Labs  Lab 09/19/19 1131  LIPASE 25   No results for input(s): AMMONIA in the last 168 hours. Coagulation profile No results for input(s): INR, PROTIME in the last 168 hours.  CBC: Recent Labs  Lab 09/19/19 1131 09/20/19 0401 09/21/19 0350 09/22/19 0504  WBC 11.6* 8.8 9.9 9.8  NEUTROABS 8.8*  --   --   --   HGB 10.7* 10.3* 9.8* 9.7*  HCT 31.9* 31.5* 29.8* 30.2*  MCV 83.3 84.0 85.1 87.0  PLT 294 282 270 249   Cardiac Enzymes: No results for input(s): CKTOTAL, CKMB, CKMBINDEX, TROPONINI in the last 168 hours. BNP: Invalid input(s): POCBNP CBG: Recent Labs  Lab 09/23/19 0727 09/23/19 1104 09/23/19 1600 09/23/19 2115 09/24/19 0635  GLUCAP 132* 172* 320* 111* 132*   D-Dimer No results for input(s): DDIMER in the last 72 hours. Hgb A1c No results for input(s): HGBA1C in the last 72 hours. Lipid Profile No results for input(s): CHOL, HDL, LDLCALC, TRIG, CHOLHDL, LDLDIRECT in the last 72 hours. Thyroid function studies No results for input(s): TSH, T4TOTAL, T3FREE, THYROIDAB in the last 72 hours.  Invalid input(s): FREET3 Anemia work up No results for input(s): VITAMINB12, FOLATE, FERRITIN, TIBC, IRON, RETICCTPCT in the last 72  hours. Microbiology Recent Results (from the past 240 hour(s))  SARS CORONAVIRUS 2 (TAT 6-24 HRS) Nasopharyngeal Nasopharyngeal Swab     Status: None   Collection Time: 09/19/19 12:55 PM   Specimen: Nasopharyngeal Swab  Result Value Ref Range Status   SARS Coronavirus 2 NEGATIVE NEGATIVE Final    Comment: (NOTE) SARS-CoV-2 target nucleic acids are NOT DETECTED. The SARS-CoV-2 RNA is generally detectable in upper and lower respiratory specimens during the acute phase of infection. Negative results do not preclude SARS-CoV-2 infection, do not rule out co-infections with other pathogens, and should not be used as the sole basis for treatment or other patient management decisions. Negative results must be combined with clinical observations, patient history, and epidemiological information. The expected result is Negative. Fact Sheet for Patients: SugarRoll.be Fact Sheet for Healthcare Providers: https://www.woods-mathews.com/ This test is not yet approved or cleared by the [place] FDA and  has been authorized for detection and/or diagnosis of SARS-CoV-2 by FDA under an Emergency Use Authorization (EUA). This EUA will remain  in effect (meaning this test can be used) for the duration of the COVID-19 declaration under Section 56 4(b)(1) of the Act, 21 U.S.C. section 360bbb-3(b)(1), unless the authorization is terminated or revoked sooner. Performed at [facility], [place] [address], [place], [place] [address]      Discharge Instructions:   Discharge Instructions    Diet - low sodium heart healthy   Complete by: As directed    Diet Carb Modified   Complete by: As directed    Increase activity slowly   Complete by: As directed      Allergies as of 09/24/2019      Reactions   Morphine And Related Itching, Other (See Comments)   Pt prefers not to be given this drug      Medication List    STOP taking these medications    amLODipine 10 MG tablet Commonly known as: NORVASC   atorvastatin 80 MG tablet Commonly known as: Lipitor  cyclobenzaprine 10 MG tablet Commonly known as: FLEXERIL   ibuprofen 800 MG tablet Commonly known as: ADVIL   Lasix 40 MG tablet Generic drug: furosemide   potassium chloride SA 20 MEQ tablet Commonly known as: KLOR-CON   sitaGLIPtin 50 MG tablet Commonly known as: Januvia     TAKE these medications   aspirin EC 81 MG tablet Take 1 tablet (81 mg total) by mouth daily.   brimonidine 0.2 % ophthalmic solution Commonly known as: ALPHAGAN Place 1 drop into the left eye 2 (two) times daily.   carvedilol 25 MG tablet Commonly known as: COREG Take 1 tablet (25 mg total) by mouth 2 (two) times daily with a meal. What changed: additional instructions   dorzolamide-timolol 22.3-6.8 MG/ML ophthalmic solution Commonly known as: COSOPT Place 1 drop into the left eye 2 (two) times daily.   gabapentin 300 MG capsule Commonly known as: NEURONTIN Take 1 capsule (300 mg total) by mouth 2 (two) times daily. What changed: when to take this   glipiZIDE 10 MG tablet Commonly known as: GLUCOTROL Take 1 tablet (10 mg total) by mouth 2 (two) times daily. What changed: how much to take   hydrALAZINE 25 MG tablet Commonly known as: APRESOLINE Take 0.5 tablets (12.5 mg total) by mouth 3 (three) times daily. What changed: how much to take   latanoprost 0.005 % ophthalmic solution Commonly known as: XALATAN Place 1 drop into the left eye at bedtime.   sodium bicarbonate 650 MG tablet Take 1 tablet (650 mg total) by mouth 3 (three) times daily.      Follow-up Information    [facility] [place] [facility].   Contact information: [place] [address] [phone]        Marliss Coots, NP Follow up in 1 week(s).   Contact information: [place] [address] [phone]            Time coordinating discharge: 55  minutes  Signed:  Geradine Girt DO  [facility] 09/24/2019, 10:33 AM

## 2019-09-24 LAB — GLUCOSE, CAPILLARY: Glucose-Capillary: 132 mg/dL — ABNORMAL HIGH (ref 70–99)

## 2019-09-25 ENCOUNTER — Inpatient Hospital Stay (HOSPITAL_COMMUNITY)
Admission: EM | Admit: 2019-09-25 | Discharge: 2019-09-27 | DRG: 291 | Disposition: A | Discharge status: Home / Self Care | Payer: Medicaid Other | Attending: Internal Medicine | Admitting: Internal Medicine

## 2019-09-25 ENCOUNTER — Other Ambulatory Visit: Payer: Self-pay

## 2019-09-25 ENCOUNTER — Encounter (HOSPITAL_COMMUNITY): Payer: Self-pay

## 2019-09-25 ENCOUNTER — Emergency Department (HOSPITAL_COMMUNITY): Payer: Medicaid Other

## 2019-09-25 DIAGNOSIS — I509 Heart failure, unspecified: Secondary | ICD-10-CM

## 2019-09-25 DIAGNOSIS — H4050X0 Glaucoma secondary to other eye disorders, unspecified eye, stage unspecified: Secondary | ICD-10-CM | POA: Diagnosis present

## 2019-09-25 DIAGNOSIS — D649 Anemia, unspecified: Secondary | ICD-10-CM

## 2019-09-25 DIAGNOSIS — E1143 Type 2 diabetes mellitus with diabetic autonomic (poly)neuropathy: Secondary | ICD-10-CM | POA: Diagnosis present

## 2019-09-25 DIAGNOSIS — J189 Pneumonia, unspecified organism: Secondary | ICD-10-CM

## 2019-09-25 DIAGNOSIS — E785 Hyperlipidemia, unspecified: Secondary | ICD-10-CM | POA: Diagnosis present

## 2019-09-25 DIAGNOSIS — H4089 Other specified glaucoma: Secondary | ICD-10-CM | POA: Diagnosis present

## 2019-09-25 DIAGNOSIS — I10 Essential (primary) hypertension: Secondary | ICD-10-CM | POA: Diagnosis present

## 2019-09-25 DIAGNOSIS — E1142 Type 2 diabetes mellitus with diabetic polyneuropathy: Secondary | ICD-10-CM | POA: Diagnosis present

## 2019-09-25 DIAGNOSIS — Z885 Allergy status to narcotic agent status: Secondary | ICD-10-CM

## 2019-09-25 DIAGNOSIS — Z79899 Other long term (current) drug therapy: Secondary | ICD-10-CM

## 2019-09-25 DIAGNOSIS — H543 Unqualified visual loss, both eyes: Secondary | ICD-10-CM | POA: Diagnosis present

## 2019-09-25 DIAGNOSIS — E1141 Type 2 diabetes mellitus with diabetic mononeuropathy: Secondary | ICD-10-CM | POA: Diagnosis present

## 2019-09-25 DIAGNOSIS — E113599 Type 2 diabetes mellitus with proliferative diabetic retinopathy without macular edema, unspecified eye: Secondary | ICD-10-CM | POA: Diagnosis present

## 2019-09-25 DIAGNOSIS — R7989 Other specified abnormal findings of blood chemistry: Secondary | ICD-10-CM | POA: Diagnosis present

## 2019-09-25 DIAGNOSIS — E11649 Type 2 diabetes mellitus with hypoglycemia without coma: Secondary | ICD-10-CM | POA: Diagnosis present

## 2019-09-25 DIAGNOSIS — Z20822 Contact with and (suspected) exposure to covid-19: Secondary | ICD-10-CM | POA: Diagnosis present

## 2019-09-25 DIAGNOSIS — Z7984 Long term (current) use of oral hypoglycemic drugs: Secondary | ICD-10-CM

## 2019-09-25 DIAGNOSIS — Z87891 Personal history of nicotine dependence: Secondary | ICD-10-CM

## 2019-09-25 DIAGNOSIS — N184 Chronic kidney disease, stage 4 (severe): Secondary | ICD-10-CM | POA: Diagnosis present

## 2019-09-25 DIAGNOSIS — D638 Anemia in other chronic diseases classified elsewhere: Secondary | ICD-10-CM | POA: Diagnosis present

## 2019-09-25 DIAGNOSIS — E1122 Type 2 diabetes mellitus with diabetic chronic kidney disease: Secondary | ICD-10-CM | POA: Diagnosis present

## 2019-09-25 DIAGNOSIS — E1139 Type 2 diabetes mellitus with other diabetic ophthalmic complication: Secondary | ICD-10-CM | POA: Diagnosis present

## 2019-09-25 DIAGNOSIS — Z833 Family history of diabetes mellitus: Secondary | ICD-10-CM

## 2019-09-25 DIAGNOSIS — I248 Other forms of acute ischemic heart disease: Secondary | ICD-10-CM | POA: Diagnosis present

## 2019-09-25 DIAGNOSIS — Z86711 Personal history of pulmonary embolism: Secondary | ICD-10-CM

## 2019-09-25 DIAGNOSIS — E1165 Type 2 diabetes mellitus with hyperglycemia: Secondary | ICD-10-CM | POA: Diagnosis present

## 2019-09-25 DIAGNOSIS — I130 Hypertensive heart and chronic kidney disease with heart failure and stage 1 through stage 4 chronic kidney disease, or unspecified chronic kidney disease: Principal | ICD-10-CM | POA: Diagnosis present

## 2019-09-25 DIAGNOSIS — R0602 Shortness of breath: Secondary | ICD-10-CM

## 2019-09-25 DIAGNOSIS — H42 Glaucoma in diseases classified elsewhere: Secondary | ICD-10-CM | POA: Diagnosis present

## 2019-09-25 DIAGNOSIS — E113592 Type 2 diabetes mellitus with proliferative diabetic retinopathy without macular edema, left eye: Secondary | ICD-10-CM | POA: Diagnosis present

## 2019-09-25 DIAGNOSIS — I5033 Acute on chronic diastolic (congestive) heart failure: Secondary | ICD-10-CM | POA: Diagnosis present

## 2019-09-25 DIAGNOSIS — Z8249 Family history of ischemic heart disease and other diseases of the circulatory system: Secondary | ICD-10-CM

## 2019-09-25 DIAGNOSIS — D631 Anemia in chronic kidney disease: Secondary | ICD-10-CM | POA: Diagnosis present

## 2019-09-25 LAB — CBC WITH DIFFERENTIAL/PLATELET
Abs Immature Granulocytes: 0.04 10*3/uL (ref 0.00–0.07)
Basophils Absolute: 0 10*3/uL (ref 0.0–0.1)
Basophils Relative: 0 %
Eosinophils Absolute: 0.3 10*3/uL (ref 0.0–0.5)
Eosinophils Relative: 3 %
HCT: 25.5 % — ABNORMAL LOW (ref 39.0–52.0)
Hemoglobin: 7.9 g/dL — ABNORMAL LOW (ref 13.0–17.0)
Immature Granulocytes: 0 %
Lymphocytes Relative: 12 %
Lymphs Abs: 1.2 10*3/uL (ref 0.7–4.0)
MCH: 27.8 pg (ref 26.0–34.0)
MCHC: 31 g/dL (ref 30.0–36.0)
MCV: 89.8 fL (ref 80.0–100.0)
Monocytes Absolute: 1.2 10*3/uL — ABNORMAL HIGH (ref 0.1–1.0)
Monocytes Relative: 12 %
Neutro Abs: 7.2 10*3/uL (ref 1.7–7.7)
Neutrophils Relative %: 73 %
Platelets: 258 10*3/uL (ref 150–400)
RBC: 2.84 MIL/uL — ABNORMAL LOW (ref 4.22–5.81)
RDW: 13.6 % (ref 11.5–15.5)
WBC: 10 10*3/uL (ref 4.0–10.5)
nRBC: 0 % (ref 0.0–0.2)

## 2019-09-25 LAB — BASIC METABOLIC PANEL
Anion gap: 13 (ref 5–15)
BUN: 60 mg/dL — ABNORMAL HIGH (ref 6–20)
CO2: 18 mmol/L — ABNORMAL LOW (ref 22–32)
Calcium: 8.5 mg/dL — ABNORMAL LOW (ref 8.9–10.3)
Chloride: 104 mmol/L (ref 98–111)
Creatinine, Ser: 4.09 mg/dL — ABNORMAL HIGH (ref 0.61–1.24)
GFR calc Af Amer: 19 mL/min — ABNORMAL LOW (ref >60)
GFR calc non Af Amer: 16 mL/min — ABNORMAL LOW (ref >60)
Glucose, Bld: 150 mg/dL — ABNORMAL HIGH (ref 70–99)
Potassium: 3.9 mmol/L (ref 3.5–5.1)
Sodium: 135 mmol/L (ref 135–145)

## 2019-09-25 LAB — D-DIMER, QUANTITATIVE: D-Dimer, Quant: 0.96 ug/mL-FEU — ABNORMAL HIGH (ref 0.00–0.50)

## 2019-09-25 LAB — TROPONIN I (HIGH SENSITIVITY)
Troponin I (High Sensitivity): 22 ng/L — ABNORMAL HIGH (ref <18)
Troponin I (High Sensitivity): 22 ng/L — ABNORMAL HIGH (ref <18)

## 2019-09-25 LAB — POC OCCULT BLOOD, ED: Fecal Occult Bld: NEGATIVE

## 2019-09-25 LAB — BRAIN NATRIURETIC PEPTIDE: B Natriuretic Peptide: 712.9 pg/mL — ABNORMAL HIGH (ref 0.0–100.0)

## 2019-09-25 MED ORDER — FUROSEMIDE 10 MG/ML IJ SOLN
40.0000 mg | Freq: Once | INTRAMUSCULAR | Status: AC
  Administered 2019-09-25: 40 mg via INTRAVENOUS
  Filled 2019-09-25: qty 4

## 2019-09-25 MED ORDER — ASPIRIN 325 MG PO TABS
325.0000 mg | ORAL_TABLET | Freq: Every day | ORAL | Status: DC
  Administered 2019-09-25 – 2019-09-27 (×3): 325 mg via ORAL
  Filled 2019-09-25 (×3): qty 1

## 2019-09-25 MED ORDER — SODIUM CHLORIDE 0.9 % IV SOLN
1.0000 g | Freq: Once | INTRAVENOUS | Status: AC
  Administered 2019-09-25: 1 g via INTRAVENOUS
  Filled 2019-09-25: qty 10

## 2019-09-25 MED ORDER — SODIUM CHLORIDE 0.9 % IV SOLN
500.0000 mg | Freq: Once | INTRAVENOUS | Status: AC
  Administered 2019-09-25: 500 mg via INTRAVENOUS
  Filled 2019-09-25: qty 500

## 2019-09-25 NOTE — ED Notes (Signed)
Patient transported to X-Ray 

## 2019-09-25 NOTE — ED Notes (Signed)
Per patient preference, guardian and caretaker [name] (cousin) has been called and updated.

## 2019-09-25 NOTE — ED Notes (Signed)
Christopher Davila ( cousin legal guardian ) [phone]  zarira (cousin) [phone]

## 2019-09-25 NOTE — ED Notes (Signed)
Pt put on 2 liters nasal cannula for dyspnea.

## 2019-09-25 NOTE — ED Triage Notes (Signed)
Patient arrived from home with a chief complaint of dyspnea that started this morning. Pt states it has gotten progressively worse. EMS reports clear lung sounds and SPO2 97% on RA. Pt complains of mild chest pain, but mostly "I can't breathe."

## 2019-09-25 NOTE — ED Notes (Signed)
Pt reports new onset blindness approximately March 2021. Surgery is scheduled.

## 2019-09-25 NOTE — ED Provider Notes (Signed)
[facility] EMERGENCY DEPARTMENT Provider Note   CSN: [id number] Arrival date & time: 09/25/19  2010     History No chief complaint on file.   Christopher Davila is a 49 y.o. male with PMHx HTN, diabetes, gastroparesis, CKD stage III who presents to the ED today with complaint of gradual onset, constant, shortness of breath and chest tightness that started today. Pt also complains of lower extremity edema. Pt was recently admitted to the hospital with new onset left eye vision loss from 04/04 to 04/08 as well as AKI superimposed on CKD, hypertensive urgency. He was discharged and sent to [facility] to see ophthalmology which he saw yesterday with plan for Diode Laser Cyclophotocoagulation on 04/13. Pt also saw cardiology yesterday and had an outpatient Echo done with EF 50-55%. He denies fevers, chills, cough, abdominal distention, nausea, vomiting, diaphoresis, or any other associated symptoms.   The history is provided by the patient and medical records.       Past Medical History:  Diagnosis Date  . Abdominal pain 12/28/2012  . Acute kidney injury superimposed on chronic kidney disease ([place])   . Blindness 09/04/2019  . Chest pain 02/14/2014  . Diabetes mellitus   . Dyspnea   . Epigastric abdominal pain   . Gastroparesis due to DM ([place])   . Hypertension   . Hypoalbuminemia 04/17/2019  . Hypoxia   . Nausea   . Neuropathy of lower extremity   . Oral candidiasis 06/26/2012  . Pancreatitis 06/2012  . Pancreatitis 06/26/2012  . Weight loss 06/26/2012  . Wheezing     Patient Active Problem List   Diagnosis Date Noted  . Vision loss of left eye 09/21/2019  . Acute kidney injury superimposed on chronic kidney disease ([place]) 09/19/2019  . Hyponatremia 09/19/2019  . Homeless 09/19/2019  . Anemia of chronic disease 09/19/2019  . Vision loss, left eye 09/19/2019  . Acute loss of vision, right 06/16/2019  . Hypertensive urgency 04/17/2019  . Hypokalemia 04/17/2019  .  Normocytic anemia 04/17/2019  . Heart failure ([place]) 02/13/2019  . Hyperglycemia   . CKD (chronic kidney disease) stage 3, GFR 30-59 ml/min   . Alcohol use   . Gastroparesis due to DM ([place])   . Essential hypertension   . Hyperlipidemia   . Type 2 diabetes mellitus with hypoglycemia ([place]) 06/26/2012  . Hypertension 06/26/2012    No past surgical history on file.     Family History  Problem Relation Age of Onset  . Diabetes Mother   . Lung disease Mother   . Hypertension Mother   . Diabetes Maternal Aunt   . CAD Maternal Aunt   . CAD Cousin     Social History   Tobacco Use  . Smoking status: Former Smoker    Packs/day: 0.00    Types: Cigarettes    Quit date: 03/22/2015    Years since quitting: 4.5  . Smokeless tobacco: Never Used  Substance Use Topics  . Alcohol use: Not Currently  . Drug use: No    Home Medications Prior to Admission medications   Medication Sig Start Date End Date Taking? Authorizing Provider  aspirin EC 81 MG tablet Take 1 tablet (81 mg total) by mouth daily. 05/24/15   Ghimire, Henreitta Leber, MD  brimonidine (ALPHAGAN) 0.2 % ophthalmic solution Place 1 drop into the left eye 2 (two) times daily. 09/23/19   Geradine Girt, DO  carvedilol (COREG) 25 MG tablet Take 1 tablet (25 mg total) by mouth 2 (  two) times daily with a meal. Patient taking differently: Take 25 mg by mouth 2 (two) times daily with a meal. Taking once daily 02/19/19   Gladys Damme, MD  dorzolamide-timolol (COSOPT) 22.3-6.8 MG/ML ophthalmic solution Place 1 drop into the left eye 2 (two) times daily. 09/23/19   Geradine Girt, DO  gabapentin (NEURONTIN) 300 MG capsule Take 1 capsule (300 mg total) by mouth 2 (two) times daily. 09/23/19   Geradine Girt, DO  glipiZIDE (GLUCOTROL) 10 MG tablet Take 1 tablet (10 mg total) by mouth 2 (two) times daily. 09/23/19   Geradine Girt, DO  hydrALAZINE (APRESOLINE) 25 MG tablet Take 0.5 tablets (12.5 mg total) by mouth 3 (three) times daily. 09/23/19    Geradine Girt, DO  latanoprost (XALATAN) 0.005 % ophthalmic solution Place 1 drop into the left eye at bedtime. 09/23/19   Geradine Girt, DO  sodium bicarbonate 650 MG tablet Take 1 tablet (650 mg total) by mouth 3 (three) times daily. 09/23/19   Geradine Girt, DO    Allergies    Morphine and related  Review of Systems   Review of Systems  Constitutional: Negative for chills and fever.  Respiratory: Positive for chest tightness and shortness of breath.   Cardiovascular: Positive for leg swelling (bilateral). Negative for palpitations.  Gastrointestinal: Negative for abdominal pain, nausea and vomiting.  All other systems reviewed and are negative.   Physical Exam Updated Vital Signs BP (!) 148/91   Pulse 88   Temp 98.3 F (36.8 C) (Oral)   Resp (!) 30   Ht 5\' 3"  (1.6 m)   Wt 60.8 kg   SpO2 98%   BMI 23.74 kg/m   Physical Exam Vitals and nursing note reviewed.  Constitutional:      Appearance: He is not ill-appearing.  HENT:     Head: Normocephalic and atraumatic.  Cardiovascular:     Rate and Rhythm: Normal rate and regular rhythm.     Pulses: Normal pulses.  Pulmonary:     Breath sounds: No wheezing, rhonchi or rales.     Comments: Tachypneic with RR in the 30's on arrival; O2 sats 97% on RA; placed on 2L for comfort  Abdominal:     Palpations: Abdomen is soft.     Tenderness: There is no abdominal tenderness. There is no guarding or rebound.  Genitourinary:    Rectum: Guaiac result negative.     Comments: Chaperone present Musculoskeletal:     Cervical back: Neck supple.     Right lower leg: Edema present.     Left lower leg: Edema present.     Comments: 1+ pitting edema bilaterally  Skin:    General: Skin is warm and dry.  Neurological:     Mental Status: He is alert.     ED Results / Procedures / Treatments   Labs (all labs ordered are listed, but only abnormal results are displayed) Labs Reviewed  BASIC METABOLIC PANEL - Abnormal; Notable for the  following components:      Result Value   CO2 18 (*)    Glucose, Bld 150 (*)    BUN 60 (*)    Creatinine, Ser 4.09 (*)    Calcium 8.5 (*)    GFR calc non Af Amer 16 (*)    GFR calc Af Amer 19 (*)    All other components within normal limits  CBC WITH DIFFERENTIAL/PLATELET - Abnormal; Notable for the following components:   RBC 2.84 (*)  Hemoglobin 7.9 (*)    HCT 25.5 (*)    Monocytes Absolute 1.2 (*)    All other components within normal limits  BRAIN NATRIURETIC PEPTIDE - Abnormal; Notable for the following components:   B Natriuretic Peptide 712.9 (*)    All other components within normal limits  D-DIMER, QUANTITATIVE (NOT AT [facility]) - Abnormal; Notable for the following components:   D-Dimer, Quant 0.96 (*)    All other components within normal limits  TROPONIN I (HIGH SENSITIVITY) - Abnormal; Notable for the following components:   Troponin I (High Sensitivity) 22 (*)    All other components within normal limits  TROPONIN I (HIGH SENSITIVITY) - Abnormal; Notable for the following components:   Troponin I (High Sensitivity) 22 (*)    All other components within normal limits  SARS CORONAVIRUS 2 (TAT 6-24 HRS)  URINALYSIS, ROUTINE W REFLEX MICROSCOPIC  POC OCCULT BLOOD, ED    EKG EKG Interpretation  Date/Time:  Saturday September 25 2019 20:39:12 EDT Ventricular Rate:  90 PR Interval:    QRS Duration: 92 QT Interval:  387 QTC Calculation: 474 R Axis:   47 Text Interpretation: Sinus rhythm Probable left atrial enlargement Probable left ventricular hypertrophy Nonspecific T abnormalities, lateral leads No significant change since last tracing Confirmed by Karle Starch  MD, Juanda Crumble [phone]) on 09/25/2019 8:53:42 PM   Radiology DG Chest 2 View  Result Date: 09/25/2019 CLINICAL DATA:  Shortness of breath. EXAM: CHEST - 2 VIEW COMPARISON:  September 23, 2019 FINDINGS: Bilateral pulmonary infiltrates are identified in the right perihilar region and the left base. No pneumothorax  identified. Stable mild cardiomegaly. The hila and mediastinum are unchanged. Possible small layering effusion on the left. IMPRESSION: 1. Bilateral pulmonary infiltrates are worrisome for pneumonia, worsened since September 23, 2019. Recommend treatment with short-term follow-up imaging to ensure resolution. Electronically Signed   By: Dorise Bullion III M.D   On: 09/25/2019 20:56    Procedures Procedures (including critical care time)  Medications Ordered in ED Medications  aspirin tablet 325 mg (325 mg Oral Given 09/25/19 2129)  cefTRIAXone (ROCEPHIN) 1 g in sodium chloride 0.9 % 100 mL IVPB (has no administration in time range)  azithromycin (ZITHROMAX) 500 mg in sodium chloride 0.9 % 250 mL IVPB (has no administration in time range)  furosemide (LASIX) injection 40 mg (has no administration in time range)    ED Course  I have reviewed the triage vital signs and the nursing notes.  Pertinent labs & imaging results that were available during my care of the patient were reviewed by me and considered in my medical decision making (see chart for details).  Clinical Course as of Sep 25 2314  Sat Sep 25, 2019  2157 D-Dimer, Quant(!): 0.96 [MV]  2159 Hemoglobin(!): 7.9 [MV]  2203 Fecal Occult Blood, POC: NEGATIVE [MV]    Clinical Course User Index [MV] Eustaquio Maize, PA-C   MDM Rules/Calculators/A&P                      49 year old male who presents to the ED today complaining of shortness of breath and chest tightness that began today.  Recently discharged from hospital after acute left eye vision loss with plan for surgery with ophthalmology on 4/13.  On arrival to the ED patient is afebrile and nontachycardic.  He is noted to be tachypneic in the 30s.  Placed on 2 L of oxygen for comfort with decrease in respiratory rate.  Patient satting 97% on room air  prior to oxygen at 99% on 2 L. Will workup for SOB today; given pt recently in the hospital for 4 days will obtain d dimer as well.    CBC without leukocytosis. Hgb decreased 1.8 points from previous. FOBT negative. Considering anemia making pt feel SOB.   Hemoglobin  Date Value Ref Range Status  09/25/2019 7.9 (L) 13.0 - 17.0 g/dL Final  09/22/2019 9.7 (L) 13.0 - 17.0 g/dL Final  09/21/2019 9.8 (L) 13.0 - 17.0 g/dL Final  09/20/2019 10.3 (L) 13.0 - 17.0 g/dL Final   BMP with glucose 150. Bicarb 18. Creatinine 4.09 and BUN 60. Due to CKD and not on dialysis will not be able to obtain CTA. Dimer is elevated at 0.96  Lab Results  Component Value Date   CREATININE 4.09 (H) 09/25/2019   CREATININE 4.55 (H) 09/23/2019   CREATININE 4.43 (H) 09/22/2019   CXR positive for bilateral pneumonia which appears worse from previus CXR 2 days ago. Will cover with abx.  Initial troponin of 22. Will repeat. EKG without new ischemic changes.  BNP elevated at 712.9. Will order IV lasix.   Feel patient would benefit from admission at this time for CAP as well as symptomatic anemia. Will consult hospitalist for admission.   Discussed case with Dr. Kennon Rounds who agrees to accept patient for admission. COVID test ordered.   This note was prepared using Dragon voice recognition software and may include unintentional dictation errors due to the inherent limitations of voice recognition software.   Final Clinical Impression(s) / ED Diagnoses Final diagnoses:  Shortness of breath  Symptomatic anemia  Community acquired pneumonia, unspecified laterality    Rx / DC Orders ED Discharge Orders    None       Eustaquio Maize, PA-C 09/25/19 2321    Truddie Hidden, MD 09/26/19 1008

## 2019-09-26 DIAGNOSIS — I248 Other forms of acute ischemic heart disease: Secondary | ICD-10-CM | POA: Diagnosis present

## 2019-09-26 DIAGNOSIS — Z87891 Personal history of nicotine dependence: Secondary | ICD-10-CM | POA: Diagnosis not present

## 2019-09-26 DIAGNOSIS — E113592 Type 2 diabetes mellitus with proliferative diabetic retinopathy without macular edema, left eye: Secondary | ICD-10-CM | POA: Diagnosis present

## 2019-09-26 DIAGNOSIS — E11649 Type 2 diabetes mellitus with hypoglycemia without coma: Secondary | ICD-10-CM | POA: Diagnosis present

## 2019-09-26 DIAGNOSIS — H4050X0 Glaucoma secondary to other eye disorders, unspecified eye, stage unspecified: Secondary | ICD-10-CM | POA: Diagnosis present

## 2019-09-26 DIAGNOSIS — E1141 Type 2 diabetes mellitus with diabetic mononeuropathy: Secondary | ICD-10-CM | POA: Diagnosis present

## 2019-09-26 DIAGNOSIS — Z8249 Family history of ischemic heart disease and other diseases of the circulatory system: Secondary | ICD-10-CM | POA: Diagnosis not present

## 2019-09-26 DIAGNOSIS — H543 Unqualified visual loss, both eyes: Secondary | ICD-10-CM | POA: Diagnosis present

## 2019-09-26 DIAGNOSIS — D638 Anemia in other chronic diseases classified elsewhere: Secondary | ICD-10-CM

## 2019-09-26 DIAGNOSIS — I130 Hypertensive heart and chronic kidney disease with heart failure and stage 1 through stage 4 chronic kidney disease, or unspecified chronic kidney disease: Secondary | ICD-10-CM | POA: Diagnosis not present

## 2019-09-26 DIAGNOSIS — E1165 Type 2 diabetes mellitus with hyperglycemia: Secondary | ICD-10-CM

## 2019-09-26 DIAGNOSIS — Z7984 Long term (current) use of oral hypoglycemic drugs: Secondary | ICD-10-CM | POA: Diagnosis not present

## 2019-09-26 DIAGNOSIS — Z833 Family history of diabetes mellitus: Secondary | ICD-10-CM | POA: Diagnosis not present

## 2019-09-26 DIAGNOSIS — E1139 Type 2 diabetes mellitus with other diabetic ophthalmic complication: Secondary | ICD-10-CM | POA: Diagnosis present

## 2019-09-26 DIAGNOSIS — N184 Chronic kidney disease, stage 4 (severe): Secondary | ICD-10-CM | POA: Diagnosis present

## 2019-09-26 DIAGNOSIS — Z885 Allergy status to narcotic agent status: Secondary | ICD-10-CM | POA: Diagnosis not present

## 2019-09-26 DIAGNOSIS — R7989 Other specified abnormal findings of blood chemistry: Secondary | ICD-10-CM | POA: Diagnosis present

## 2019-09-26 DIAGNOSIS — I509 Heart failure, unspecified: Secondary | ICD-10-CM

## 2019-09-26 DIAGNOSIS — I10 Essential (primary) hypertension: Secondary | ICD-10-CM

## 2019-09-26 DIAGNOSIS — R0602 Shortness of breath: Secondary | ICD-10-CM | POA: Diagnosis not present

## 2019-09-26 DIAGNOSIS — D631 Anemia in chronic kidney disease: Secondary | ICD-10-CM | POA: Diagnosis present

## 2019-09-26 DIAGNOSIS — E1143 Type 2 diabetes mellitus with diabetic autonomic (poly)neuropathy: Secondary | ICD-10-CM | POA: Diagnosis present

## 2019-09-26 DIAGNOSIS — E1142 Type 2 diabetes mellitus with diabetic polyneuropathy: Secondary | ICD-10-CM | POA: Diagnosis present

## 2019-09-26 DIAGNOSIS — Z20822 Contact with and (suspected) exposure to covid-19: Secondary | ICD-10-CM | POA: Diagnosis present

## 2019-09-26 DIAGNOSIS — I5033 Acute on chronic diastolic (congestive) heart failure: Secondary | ICD-10-CM | POA: Diagnosis present

## 2019-09-26 DIAGNOSIS — E11319 Type 2 diabetes mellitus with unspecified diabetic retinopathy without macular edema: Secondary | ICD-10-CM

## 2019-09-26 DIAGNOSIS — E1122 Type 2 diabetes mellitus with diabetic chronic kidney disease: Secondary | ICD-10-CM | POA: Diagnosis present

## 2019-09-26 DIAGNOSIS — Z79899 Other long term (current) drug therapy: Secondary | ICD-10-CM | POA: Diagnosis not present

## 2019-09-26 DIAGNOSIS — E785 Hyperlipidemia, unspecified: Secondary | ICD-10-CM | POA: Diagnosis present

## 2019-09-26 LAB — BASIC METABOLIC PANEL
Anion gap: 13 (ref 5–15)
BUN: 58 mg/dL — ABNORMAL HIGH (ref 6–20)
CO2: 18 mmol/L — ABNORMAL LOW (ref 22–32)
Calcium: 8.6 mg/dL — ABNORMAL LOW (ref 8.9–10.3)
Chloride: 106 mmol/L (ref 98–111)
Creatinine, Ser: 3.87 mg/dL — ABNORMAL HIGH (ref 0.61–1.24)
GFR calc Af Amer: 20 mL/min — ABNORMAL LOW (ref >60)
GFR calc non Af Amer: 17 mL/min — ABNORMAL LOW (ref >60)
Glucose, Bld: 147 mg/dL — ABNORMAL HIGH (ref 70–99)
Potassium: 3.5 mmol/L (ref 3.5–5.1)
Sodium: 137 mmol/L (ref 135–145)

## 2019-09-26 LAB — URINALYSIS, ROUTINE W REFLEX MICROSCOPIC
Bilirubin Urine: NEGATIVE
Glucose, UA: 50 mg/dL — AB
Hgb urine dipstick: NEGATIVE
Ketones, ur: NEGATIVE mg/dL
Leukocytes,Ua: NEGATIVE
Nitrite: NEGATIVE
Protein, ur: >=300 mg/dL — AB
Specific Gravity, Urine: 1.012 (ref 1.005–1.030)
pH: 6 (ref 5.0–8.0)

## 2019-09-26 LAB — GLUCOSE, CAPILLARY
Glucose-Capillary: 121 mg/dL — ABNORMAL HIGH (ref 70–99)
Glucose-Capillary: 136 mg/dL — ABNORMAL HIGH (ref 70–99)
Glucose-Capillary: 80 mg/dL (ref 70–99)
Glucose-Capillary: 97 mg/dL (ref 70–99)

## 2019-09-26 LAB — CBC
HCT: 23.3 % — ABNORMAL LOW (ref 39.0–52.0)
Hemoglobin: 7.5 g/dL — ABNORMAL LOW (ref 13.0–17.0)
MCH: 27.9 pg (ref 26.0–34.0)
MCHC: 32.2 g/dL (ref 30.0–36.0)
MCV: 86.6 fL (ref 80.0–100.0)
Platelets: 233 10*3/uL (ref 150–400)
RBC: 2.69 MIL/uL — ABNORMAL LOW (ref 4.22–5.81)
RDW: 13.6 % (ref 11.5–15.5)
WBC: 8 10*3/uL (ref 4.0–10.5)
nRBC: 0 % (ref 0.0–0.2)

## 2019-09-26 LAB — SARS CORONAVIRUS 2 (TAT 6-24 HRS): SARS Coronavirus 2: NEGATIVE

## 2019-09-26 LAB — CBG MONITORING, ED: Glucose-Capillary: 95 mg/dL (ref 70–99)

## 2019-09-26 MED ORDER — DORZOLAMIDE HCL-TIMOLOL MAL 2-0.5 % OP SOLN
1.0000 [drp] | Freq: Two times a day (BID) | OPHTHALMIC | Status: DC
  Administered 2019-09-26 – 2019-09-27 (×4): 1 [drp] via OPHTHALMIC
  Filled 2019-09-26: qty 10

## 2019-09-26 MED ORDER — BRIMONIDINE TARTRATE 0.2 % OP SOLN
1.0000 [drp] | Freq: Two times a day (BID) | OPHTHALMIC | Status: DC
  Administered 2019-09-26 – 2019-09-27 (×4): 1 [drp] via OPHTHALMIC
  Filled 2019-09-26: qty 5

## 2019-09-26 MED ORDER — GLIPIZIDE 10 MG PO TABS
10.0000 mg | ORAL_TABLET | Freq: Two times a day (BID) | ORAL | Status: DC
  Administered 2019-09-26: 10 mg via ORAL
  Filled 2019-09-26 (×2): qty 1

## 2019-09-26 MED ORDER — ENOXAPARIN SODIUM 30 MG/0.3ML ~~LOC~~ SOLN
30.0000 mg | Freq: Every day | SUBCUTANEOUS | Status: DC
  Administered 2019-09-26 – 2019-09-27 (×2): 30 mg via SUBCUTANEOUS
  Filled 2019-09-26 (×2): qty 0.3

## 2019-09-26 MED ORDER — HYDRALAZINE HCL 25 MG PO TABS
12.5000 mg | ORAL_TABLET | Freq: Three times a day (TID) | ORAL | Status: DC
  Administered 2019-09-26 – 2019-09-27 (×5): 12.5 mg via ORAL
  Filled 2019-09-26 (×5): qty 1

## 2019-09-26 MED ORDER — SENNOSIDES-DOCUSATE SODIUM 8.6-50 MG PO TABS: 1.0000 | ORAL_TABLET | Freq: Every evening | ORAL | Status: DC | PRN

## 2019-09-26 MED ORDER — LATANOPROST 0.005 % OP SOLN
1.0000 [drp] | Freq: Every day | OPHTHALMIC | Status: DC
  Administered 2019-09-26 (×2): 1 [drp] via OPHTHALMIC
  Filled 2019-09-26: qty 2.5

## 2019-09-26 MED ORDER — CARVEDILOL 25 MG PO TABS
25.0000 mg | ORAL_TABLET | Freq: Two times a day (BID) | ORAL | Status: DC
  Administered 2019-09-26 – 2019-09-27 (×4): 25 mg via ORAL
  Filled 2019-09-26 (×4): qty 1

## 2019-09-26 MED ORDER — FUROSEMIDE 10 MG/ML IJ SOLN
80.0000 mg | Freq: Four times a day (QID) | INTRAMUSCULAR | Status: DC
  Administered 2019-09-26 – 2019-09-27 (×6): 80 mg via INTRAVENOUS
  Filled 2019-09-26 (×6): qty 8

## 2019-09-26 MED ORDER — GABAPENTIN 300 MG PO CAPS
300.0000 mg | ORAL_CAPSULE | Freq: Two times a day (BID) | ORAL | Status: DC
  Administered 2019-09-26 – 2019-09-27 (×4): 300 mg via ORAL
  Filled 2019-09-26 (×2): qty 1
  Filled 2019-09-26: qty 3
  Filled 2019-09-26: qty 1

## 2019-09-26 MED ORDER — SODIUM BICARBONATE 650 MG PO TABS
650.0000 mg | ORAL_TABLET | Freq: Three times a day (TID) | ORAL | Status: DC
  Administered 2019-09-26 – 2019-09-27 (×5): 650 mg via ORAL
  Filled 2019-09-26 (×5): qty 1

## 2019-09-26 MED ORDER — INSULIN ASPART 100 UNIT/ML ~~LOC~~ SOLN
0.0000 [IU] | Freq: Three times a day (TID) | SUBCUTANEOUS | Status: DC
  Administered 2019-09-27: 2 [IU] via SUBCUTANEOUS

## 2019-09-26 NOTE — Progress Notes (Signed)
  PROGRESS NOTE  Patient admitted earlier this morning. See H&P.   Christopher Davila is a 49 year old male with past medical history significant for diabetes, hypertension, CKD stage IIIb, peripheral neuropathy, diabetic retinopathy, bilateral vision loss, glaucoma, chronic diastolic heart failure who presents to the hospital with worsening chest pain and shortness of breath.  He admitted to bilateral lower extremity swelling, states that he has been not been taking his furosemide for the past couple of days.  In the emergency department, he was found to have elevated BNP as well as elevated D-dimer.  Admitted for CHF exacerbation, started on IV Lasix.  Patient seen and examined.  States that his breathing has improved but not to baseline.  Remains on room air without respiratory distress.  A/P:  Acute diastolic CHF exacerbation -BNP 712.9 -Chest x-ray revealed bilateral pulmonary infiltrate -Lasix IV  -Strict I's and O's -Daily weight -Ted hose   Demand ischemia -Troponin 22 -Continue aspirin   Elevated D-dimer -Low suspicion for PE but for completion sake will obtain VQ scan  CKD stage 4  -Baseline creatinine 3.3-4.5 during previous hospitalization last week -Improved at 3.87   Type 2 diabetes, well controlled  -Hemoglobin A1c 6.4 -Sliding scale insulin, neurontin   Essential hypertension -Continue Coreg, hydralazine  Left vision loss, likely neovascular glaucoma and proliferative diabetic retinopathy -Schedule for laser treatment on Tuesday 4/13 at [facility]  Status is: Observation  The patient remains OBS appropriate and will d/c before 2 midnights.  Dispo: The patient is from: Home              Anticipated d/c is to: Home              Anticipated d/c date is: 1 day              Patient currently is not medically stable to d/c. Requiring IV lasix currently. Hopeful discharge home 4/12 pending surgical procedure scheduled at [facility] on 4/13.    Dessa Phi, DO [facility] 09/26/2019, 10:38 AM  Available via Epic secure chat 7am-7pm After these hours, please refer to coverage provider listed on [email or web address]

## 2019-09-26 NOTE — H&P (Signed)
History and Physical    Christopher Davila FUX:[id number] DOB: 20-Sep-1970 DOA: 09/25/2019  PCP: Marliss Coots, NP  Patient coming from: cousin's home  I have personally briefly reviewed patient's old medical records in [place]  Chief Complaint: shortness of breath  HPI: Christopher Davila is a 49 y.o. male with medical history significant of uncontrolled DM, poorly controlled HTN, CKD stage IIIb, peripheral neuropathy, diabetic retinopathy, with bilateral vision loss, glaucoma related to retinopathy, CHF, comes in with worsening CP and SOB that started today. Has h/o CHF, feels similar. No cough, no sputum production. No fevers at home. Does have bilateral LE swelling. Last ECHO was 01/2019 and showed EF 02-54% with diastolic pseudo-normalization. ED Course: given IV lasix  Review of Systems: As per HPI otherwise 10 point review of systems negative.   Past Medical History:  Diagnosis Date  . Abdominal pain 12/28/2012  . Acute kidney injury superimposed on chronic kidney disease ([place])   . Blindness 09/04/2019  . Chest pain 02/14/2014  . Diabetes mellitus   . Dyspnea   . Epigastric abdominal pain   . Gastroparesis due to DM ([place])   . Hypertension   . Hypoalbuminemia 04/17/2019  . Hypoxia   . Nausea   . Neuropathy of lower extremity   . Oral candidiasis 06/26/2012  . Pancreatitis 06/2012  . Pancreatitis 06/26/2012  . Weight loss 06/26/2012  . Wheezing     No past surgical history on file.   reports that he quit smoking about 4 years ago. His smoking use included cigarettes. He smoked 0.00 packs per day. He has never used smokeless tobacco. He reports previous alcohol use. He reports that he does not use drugs.  Allergies  Allergen Reactions  . Morphine Itching    Pt prefers not to be given this drug  . Morphine And Related Itching and Other (See Comments)    Pt prefers not to be given this drug    Family History  Problem Relation Age of Onset  . Diabetes Mother   . Lung  disease Mother   . Hypertension Mother   . Diabetes Maternal Aunt   . CAD Maternal Aunt   . CAD Cousin     Prior to Admission medications   Medication Sig Start Date End Date Taking? Authorizing Provider  aspirin EC 81 MG tablet Take 1 tablet (81 mg total) by mouth daily. 05/24/15   Ghimire, Henreitta Leber, MD  brimonidine (ALPHAGAN) 0.2 % ophthalmic solution Place 1 drop into the left eye 2 (two) times daily. 09/23/19   Geradine Girt, DO  carvedilol (COREG) 25 MG tablet Take 1 tablet (25 mg total) by mouth 2 (two) times daily with a meal. Patient taking differently: Take 25 mg by mouth 2 (two) times daily with a meal. Taking once daily 02/19/19   Gladys Damme, MD  dorzolamide-timolol (COSOPT) 22.3-6.8 MG/ML ophthalmic solution Place 1 drop into the left eye 2 (two) times daily. 09/23/19   Geradine Girt, DO  gabapentin (NEURONTIN) 300 MG capsule Take 1 capsule (300 mg total) by mouth 2 (two) times daily. 09/23/19   Geradine Girt, DO  glipiZIDE (GLUCOTROL) 10 MG tablet Take 1 tablet (10 mg total) by mouth 2 (two) times daily. 09/23/19   Geradine Girt, DO  hydrALAZINE (APRESOLINE) 25 MG tablet Take 0.5 tablets (12.5 mg total) by mouth 3 (three) times daily. 09/23/19   Geradine Girt, DO  latanoprost (XALATAN) 0.005 % ophthalmic solution Place 1 drop into the left  eye at bedtime. 09/23/19   Geradine Girt, DO  sodium bicarbonate 650 MG tablet Take 1 tablet (650 mg total) by mouth 3 (three) times daily. 09/23/19   Geradine Girt, DO    Physical Exam: Vitals:   09/25/19 2230 09/25/19 2300 09/25/19 2330 09/26/19 0030  BP: (!) 142/82 (!) 141/85 113/82 131/81  Pulse: 86 84 84 82  Resp:  (!) 28 20 18   Temp:      TempSrc:      SpO2: 99% 99% 100% 96%  Weight:      Height:        Constitutional: NAD, calm, comfortable Eyes: conjunctivae normal ENMT: Mucous membranes are moist. Posterior pharynx clear of any exudate or lesions.Normal dentition.  Neck: normal, supple, no masses, no thyromegaly  Respiratory: tachypnea, increased WOB, diffuse rales Cardiovascular: Regular rate and rhythm, no murmurs / rubs / gallops.1-2+ edema. 2+ pedal pulse on left, right is diminished. No carotid bruits.  Abdomen: no tenderness, no masses palpated. No hepatosplenomegaly. Bowel sounds positive.  Musculoskeletal: no clubbing / cyanosis. No joint deformity upper and lower extremities. Good ROM, no contractures. Normal muscle tone.  Skin: no rashes, lesions, ulcers. No induration Neurologic: CN 2-12 grossly intact Psychiatric: Normal judgment and insight. Alert and oriented x 3. Normal mood.   Labs on Admission: I have personally reviewed following labs and imaging studies  CBC: Recent Labs  Lab 09/19/19 1131 09/20/19 0401 09/21/19 0350 09/22/19 0504 09/25/19 2036  WBC 11.6* 8.8 9.9 9.8 10.0  NEUTROABS 8.8*  --   --   --  7.2  HGB 10.7* 10.3* 9.8* 9.7* 7.9*  HCT 31.9* 31.5* 29.8* 30.2* 25.5*  MCV 83.3 84.0 85.1 87.0 89.8  PLT 294 282 270 249 233   Basic Metabolic Panel: Recent Labs  Lab 09/20/19 0401 09/21/19 0350 09/22/19 0504 09/23/19 0418 09/25/19 2036  NA 134* 132* 134* 133* 135  K 4.0 3.9 4.3 4.4 3.9  CL 102 102 105 103 104  CO2 22 19* 18* 18* 18*  GLUCOSE 103* 180* 170* 195* 150*  BUN 26* 39* 48* 56* 60*  CREATININE 3.44* 4.16* 4.43* 4.55* 4.09*  CALCIUM 8.2* 8.2* 8.4* 8.2* 8.5*  PHOS  --   --  6.4* 6.4*  --    GFR: Estimated Creatinine Clearance: 18 mL/min (A) (by C-G formula based on SCr of 4.09 mg/dL (H)). Liver Function Tests: Recent Labs  Lab 09/19/19 1131 09/22/19 0504 09/23/19 0418  AST 27  --   --   ALT 17  --   --   ALKPHOS 76  --   --   BILITOT 0.5  --   --   PROT 6.7  --   --   ALBUMIN 2.4* 2.4* 2.2*   Recent Labs  Lab 09/19/19 1131  LIPASE 25   CBG: Recent Labs  Lab 09/23/19 0727 09/23/19 1104 09/23/19 1600 09/23/19 2115 09/24/19 0635  GLUCAP 132* 172* 320* 111* 132*   Urine analysis:    Component Value Date/Time   COLORURINE YELLOW  09/26/2019 0039   APPEARANCEUR HAZY (A) 09/26/2019 0039   LABSPEC 1.012 09/26/2019 0039   PHURINE 6.0 09/26/2019 0039   GLUCOSEU 50 (A) 09/26/2019 0039   HGBUR NEGATIVE 09/26/2019 0039   BILIRUBINUR NEGATIVE 09/26/2019 0039   KETONESUR NEGATIVE 09/26/2019 0039   PROTEINUR >=300 (A) 09/26/2019 0039   UROBILINOGEN 0.2 12/13/2014 2056   NITRITE NEGATIVE 09/26/2019 0039   LEUKOCYTESUR NEGATIVE 09/26/2019 0039    Radiological Exams on Admission: DG Chest 2  View  Result Date: 09/25/2019 CLINICAL DATA:  Shortness of breath. EXAM: CHEST - 2 VIEW COMPARISON:  September 23, 2019 FINDINGS: Bilateral pulmonary infiltrates are identified in the right perihilar region and the left base. No pneumothorax identified. Stable mild cardiomegaly. The hila and mediastinum are unchanged. Possible small layering effusion on the left. IMPRESSION: 1. Bilateral pulmonary infiltrates are worrisome for pneumonia, worsened since September 23, 2019. Recommend treatment with short-term follow-up imaging to ensure resolution. Electronically Signed   By: Dorise Bullion III M.D   On: 09/25/2019 20:56    EKG: Independently reviewed. Probable left atrial enlargement Probable left ventricular hypertrophy.  Assessment/Plan Active Problems:   Type 2 diabetes mellitus with hypoglycemia (HCC)   Essential hypertension   Hyperlipidemia   Heart failure (HCC)   CKD (chronic kidney disease) stage 3, GFR 30-59 ml/min   Anemia of chronic disease   CHF (congestive heart failure) (HCC)  CHF exacerbation - likely given elevated BNP and bilateral infiltrates. S/p Abx in ED--if spikes can consider resuming Abx.  CKD IIIb - saw nephrology last visit, may need again  Anemia of chronic disease - normal B12 and Folate in 05/2019, likely related to poor kidney function--was 9.7, no evidence of bleeding on exam. Continue to monitor, consider blood transfusion if dropping  Type 2 diabetes poorly controlled - resume home meds glipizide monitor  CBGs, SSI extra sensitive  Essential HTN - BP is ok today continue Coreg, hydralazine  Diabetic retinopathy - continue drops to both eyes. Scheduled for laser treatment on Tuesday if breathing is improved.  DVT prophylaxis: Lovenox SQ Code Status: Full code  Family Communication: none available  Disposition Plan: Home, hopefully Consults called: None now Admission status: Observation   Donnamae Jude MD [facility]  If 7PM-7AM, please contact night-coverage 09/26/2019, 1:07 AM

## 2019-09-26 NOTE — Progress Notes (Signed)
Patient arrived from the ED on a hospital bed , assessment completed see flow sheet, placed on tele ccmd notified patient oriented to room and staff, bed in lowest position call bell within reach will continue to monitor.

## 2019-09-27 ENCOUNTER — Inpatient Hospital Stay (HOSPITAL_COMMUNITY): Payer: Medicaid Other

## 2019-09-27 DIAGNOSIS — I5033 Acute on chronic diastolic (congestive) heart failure: Secondary | ICD-10-CM

## 2019-09-27 HISTORY — DX: Acute on chronic diastolic (congestive) heart failure: I50.33

## 2019-09-27 LAB — BASIC METABOLIC PANEL
Anion gap: 13 (ref 5–15)
BUN: 61 mg/dL — ABNORMAL HIGH (ref 6–20)
CO2: 21 mmol/L — ABNORMAL LOW (ref 22–32)
Calcium: 8.5 mg/dL — ABNORMAL LOW (ref 8.9–10.3)
Chloride: 102 mmol/L (ref 98–111)
Creatinine, Ser: 3.96 mg/dL — ABNORMAL HIGH (ref 0.61–1.24)
GFR calc Af Amer: 20 mL/min — ABNORMAL LOW (ref >60)
GFR calc non Af Amer: 17 mL/min — ABNORMAL LOW (ref >60)
Glucose, Bld: 112 mg/dL — ABNORMAL HIGH (ref 70–99)
Potassium: 3.5 mmol/L (ref 3.5–5.1)
Sodium: 136 mmol/L (ref 135–145)

## 2019-09-27 LAB — GLUCOSE, CAPILLARY
Glucose-Capillary: 110 mg/dL — ABNORMAL HIGH (ref 70–99)
Glucose-Capillary: 137 mg/dL — ABNORMAL HIGH (ref 70–99)
Glucose-Capillary: 226 mg/dL — ABNORMAL HIGH (ref 70–99)

## 2019-09-27 MED ORDER — ACETAMINOPHEN 325 MG PO TABS
650.0000 mg | ORAL_TABLET | Freq: Four times a day (QID) | ORAL | Status: DC | PRN
  Administered 2019-09-27: 650 mg via ORAL
  Filled 2019-09-27: qty 2

## 2019-09-27 MED ORDER — TECHNETIUM TO 99M ALBUMIN AGGREGATED
1.6000 | Freq: Once | INTRAVENOUS | Status: AC | PRN
  Administered 2019-09-27: 1.6 via INTRAVENOUS

## 2019-09-27 MED ORDER — FUROSEMIDE 40 MG PO TABS: 40.0000 mg | ORAL_TABLET | Freq: Every day | ORAL | 2 refills | Status: DC

## 2019-09-27 NOTE — Progress Notes (Signed)
Radiology staff called this RN that they need an order for a 2 view chest xray in order for them to do a VQ scan. Blount MD notified via Shea Evans will continue to monitor.

## 2019-09-27 NOTE — Progress Notes (Signed)
Called nuclear medicine to make aware that patient was ready for V/Q scan as chest xray was completed. Informed that medication was delivered and ready and they would be sending for patient.

## 2019-09-27 NOTE — Discharge Summary (Addendum)
Physician Discharge Summary  Christopher Davila AVW:[id number] DOB: 1971/06/15 DOA: 09/25/2019  PCP: Marliss Coots, NP  Admit date: 09/25/2019 Discharge date: 09/27/2019  Admitted From: Home Disposition:  Home  Recommendations for Outpatient Follow-up:  1. Follow up with PCP in 1 week 2. Follow up with [facility] for scheduled surgery 4/13 3. Please obtain BMP to follow up on AKI in 1 week 4. Call [place] for follow up   Discharge Condition: Stable CODE STATUS: Full  Diet recommendation: Heart healthy/Renal   Brief/Interim Summary: Christopher Davila is a 49 year old male with past medical history significant for diabetes, hypertension, CKD stage IIIb, peripheral neuropathy, diabetic retinopathy, bilateral vision loss, glaucoma, chronic diastolic heart failure who presents to the hospital with worsening chest pain and shortness of breath.  He admitted to bilateral lower extremity swelling, states that he has been not been taking his furosemide for the past couple of days.  In the emergency department, he was found to have elevated BNP as well as elevated D-dimer.  Admitted for CHF exacerbation, started on IV Lasix. Patient remained stable on room air, repeat CXR with improvement and improvement in peripheral edema. He underwent VQ scan which was negative.   Discharge Diagnoses:  Principal Problem:   Acute on chronic diastolic CHF (congestive heart failure) (HCC) Active Problems:   Type 2 diabetes mellitus with hypoglycemia (HCC)   Essential hypertension   Hyperlipidemia   CKD (chronic kidney disease) stage 4, GFR 15-29 ml/min (HCC)   Anemia of chronic disease   Acute diastolic CHF exacerbation -BNP 712.9 -Chest x-ray revealed bilateral pulmonary infiltrate -Repeat chest x-ray revealed improved perihilar airspace disease  -Lasix IV --> lasix PO 40mg  daily  -Strict I's and O's --> UOP 2473ml last 24 hours  -Daily weight  -Ted hose  -Remains on room air with overall  improvement in peripheral edema   Demand ischemia -Troponin 22 -Continue aspirin   Elevated D-dimer -VQ scan negative   CKD stage 4  -Baseline creatinine 3.3-4.5 during previous hospitalization last week -Cr on admission 4.09 --> 3.87 --> 3.96  -Follow up outpatient BMP  -Has been seen by Nephrology inpatient previous admission. He needs to follow up with [facility] as outpatient.   Type 2 diabetes, well controlled  -Hemoglobin A1c 6.4 -Sliding scale insulin, neurontin   Essential hypertension -Continue Coreg, hydralazine  Left vision loss, likely neovascular glaucoma and proliferative diabetic retinopathy -Schedule for laser treatment on Tuesday 4/13 at [facility]    Discharge Instructions  Discharge Instructions    (HEART FAILURE PATIENTS) Call MD:  Anytime you have any of the following symptoms: 1) 3 pound weight gain in 24 hours or 5 pounds in 1 week 2) shortness of breath, with or without a dry hacking cough 3) swelling in the hands, feet or stomach 4) if you have to sleep on extra pillows at night in order to breathe.   Complete by: As directed    Call MD for:  difficulty breathing, headache or visual disturbances   Complete by: As directed    Call MD for:  extreme fatigue   Complete by: As directed    Call MD for:  persistant dizziness or light-headedness   Complete by: As directed    Call MD for:  persistant nausea and vomiting   Complete by: As directed    Call MD for:  severe uncontrolled pain   Complete by: As directed    Call MD for:  temperature >100.4   Complete by:  As directed    Diet - low sodium heart healthy   Complete by: As directed    Discharge instructions   Complete by: As directed    You were cared for by a hospitalist during your hospital stay. If you have any questions about your discharge medications or the care you received while you were in the hospital after you are discharged, you can call the unit and ask to speak  with the hospitalist on call if the hospitalist that took care of you is not available. Once you are discharged, your primary care physician will handle any further medical issues. Please note that NO REFILLS for any discharge medications will be authorized once you are discharged, as it is imperative that you return to your primary care physician (or establish a relationship with a primary care physician if you do not have one) for your aftercare needs so that they can reassess your need for medications and monitor your lab values.   Increase activity slowly   Complete by: As directed      Allergies as of 09/27/2019      Reactions   Morphine Itching   Pt prefers not to be given this drug   Morphine And Related Itching, Other (See Comments)   Pt prefers not to be given this drug      Medication List    TAKE these medications   aspirin EC 81 MG tablet Take 1 tablet (81 mg total) by mouth daily.   brimonidine 0.2 % ophthalmic solution Commonly known as: ALPHAGAN Place 1 drop into the left eye 2 (two) times daily.   carvedilol 25 MG tablet Commonly known as: COREG Take 1 tablet (25 mg total) by mouth 2 (two) times daily with a meal. What changed: additional instructions   dorzolamide-timolol 22.3-6.8 MG/ML ophthalmic solution Commonly known as: COSOPT Place 1 drop into the left eye 2 (two) times daily.   furosemide 40 MG tablet Commonly known as: Lasix Take 1 tablet (40 mg total) by mouth daily.   gabapentin 300 MG capsule Commonly known as: NEURONTIN Take 1 capsule (300 mg total) by mouth 2 (two) times daily. What changed: when to take this   glipiZIDE 10 MG tablet Commonly known as: GLUCOTROL Take 1 tablet (10 mg total) by mouth 2 (two) times daily.   hydrALAZINE 25 MG tablet Commonly known as: APRESOLINE Take 0.5 tablets (12.5 mg total) by mouth 3 (three) times daily.   latanoprost 0.005 % ophthalmic solution Commonly known as: XALATAN Place 1 drop into the left eye  at bedtime.   sodium bicarbonate 650 MG tablet Take 1 tablet (650 mg total) by mouth 3 (three) times daily.      Follow-up Information    Placey, Audrea Muscat, NP. Schedule an appointment as soon as possible for a visit in 1 week(s).   Contact information: [place] [address] [phone]        [place], [place]. Call.   Why: Call and make an appointment to follow up in office regarding your chronic kidney disease Contact information: [address] [place] [address] [phone]          Allergies  Allergen Reactions  . Morphine Itching    Pt prefers not to be given this drug  . Morphine And Related Itching and Other (See Comments)    Pt prefers not to be given this drug    Consultations:  None    Procedures/Studies: DG Chest 2 View  Result  Date: 09/27/2019 CLINICAL DATA:  Shortness of breath with possible pulmonary embolism. EXAM: CHEST - 2 VIEW COMPARISON:  Two days ago FINDINGS: Improved perihilar opacities. Stable small pleural effusions. Borderline heart size. Stable aortic contours. IMPRESSION: 1. Improved perihilar airspace disease. 2. Stable small pleural effusions. Electronically Signed   By: Monte Fantasia M.D.   On: 09/27/2019 07:28   DG Chest 2 View  Result Date: 09/25/2019 CLINICAL DATA:  Shortness of breath. EXAM: CHEST - 2 VIEW COMPARISON:  September 23, 2019 FINDINGS: Bilateral pulmonary infiltrates are identified in the right perihilar region and the left base. No pneumothorax identified. Stable mild cardiomegaly. The hila and mediastinum are unchanged. Possible small layering effusion on the left. IMPRESSION: 1. Bilateral pulmonary infiltrates are worrisome for pneumonia, worsened since September 23, 2019. Recommend treatment with short-term follow-up imaging to ensure resolution. Electronically Signed   By: Dorise Bullion III M.D   On: 09/25/2019 20:56   NM Pulmonary Perfusion  Result Date: 09/27/2019 CLINICAL DATA:  Concern for pulmonary  embolism. In patient. Short of breath with some improvement. EXAM: NUCLEAR MEDICINE PERFUSION LUNG SCAN TECHNIQUE: Perfusion images were obtained in multiple projections after intravenous injection of radiopharmaceutical. RADIOPHARMACEUTICALS:  1.6 mCi Tc-52m MAA COMPARISON:  Chest radiograph same day, nuclear medicine perfusion scan 02/13/2019 FINDINGS: No wedge-shaped peripheral perfusion defects to suggest acute pulmonary embolism. No significant change from comparison perfusion scan. IMPRESSION: No evidence acute pulmonary embolism. Electronically Signed   By: Suzy Bouchard M.D.   On: 09/27/2019 13:06   DG CHEST PORT 1 VIEW  Result Date: 09/23/2019 CLINICAL DATA:  49 year old male with shortness of breath. EXAM: PORTABLE CHEST 1 VIEW COMPARISON:  Chest radiograph dated 04/17/2019 FINDINGS: Small bilateral pleural effusions with associated bibasilar atelectasis. Pneumonia is not excluded clinical correlation is recommended. No pneumothorax. Borderline cardiomegaly. No acute osseous pathology. IMPRESSION: Small bilateral pleural effusions with associated bibasilar atelectasis. Pneumonia is not excluded. Electronically Signed   By: Anner Crete M.D.   On: 09/23/2019 18:21       Discharge Exam: Vitals:   09/27/19 0900 09/27/19 1257  BP: (!) 154/98 136/88  Pulse: 88 82  Resp: (!) 26 20  Temp: 98 F (36.7 C) 98.7 F (37.1 C)  SpO2: 98% 98%     General: Pt is alert, awake, not in acute distress Cardiovascular: RRR, S1/S2 +, mild edema Respiratory: CTA bilaterally, no wheezing, no rhonchi, no respiratory distress, no conversational dyspnea, on room air  Abdominal: Soft, NT, ND, bowel sounds + Extremities: mild edema, no cyanosis Psych: Normal mood and affect, stable judgement and insight     The results of significant diagnostics from this hospitalization (including imaging, microbiology, ancillary and laboratory) are listed below for reference.     Microbiology: Recent Results  (from the past 240 hour(s))  SARS CORONAVIRUS 2 (TAT 6-24 HRS) Nasopharyngeal Nasopharyngeal Swab     Status: None   Collection Time: 09/19/19 12:55 PM   Specimen: Nasopharyngeal Swab  Result Value Ref Range Status   SARS Coronavirus 2 NEGATIVE NEGATIVE Final    Comment: (NOTE) SARS-CoV-2 target nucleic acids are NOT DETECTED. The SARS-CoV-2 RNA is generally detectable in upper and lower respiratory specimens during the acute phase of infection. Negative results do not preclude SARS-CoV-2 infection, do not rule out co-infections with other pathogens, and should not be used as the sole basis for treatment or other patient management decisions. Negative results must be combined with clinical observations, patient history, and epidemiological information. The expected result is Negative. Fact Sheet  for Patients: SugarRoll.be Fact Sheet for Healthcare Providers: https://www.woods-mathews.com/ This test is not yet approved or cleared by the [place] FDA and  has been authorized for detection and/or diagnosis of SARS-CoV-2 by FDA under an Emergency Use Authorization (EUA). This EUA will remain  in effect (meaning this test can be used) for the duration of the COVID-19 declaration under Section 56 4(b)(1) of the Act, 21 U.S.C. section 360bbb-3(b)(1), unless the authorization is terminated or revoked sooner. Performed at [facility], [place] [address] [place], [place] [address]   SARS CORONAVIRUS 2 (TAT 6-24 HRS) Nasopharyngeal Nasopharyngeal Swab     Status: None   Collection Time: 09/25/19 11:14 PM   Specimen: Nasopharyngeal Swab  Result Value Ref Range Status   SARS Coronavirus 2 NEGATIVE NEGATIVE Final    Comment: (NOTE) SARS-CoV-2 target nucleic acids are NOT DETECTED. The SARS-CoV-2 RNA is generally detectable in upper and lower respiratory specimens during the acute phase of infection. Negative results do not preclude SARS-CoV-2  infection, do not rule out co-infections with other pathogens, and should not be used as the sole basis for treatment or other patient management decisions. Negative results must be combined with clinical observations, patient history, and epidemiological information. The expected result is Negative. Fact Sheet for Patients: SugarRoll.be Fact Sheet for Healthcare Providers: https://www.woods-mathews.com/ This test is not yet approved or cleared by the [place] FDA and  has been authorized for detection and/or diagnosis of SARS-CoV-2 by FDA under an Emergency Use Authorization (EUA). This EUA will remain  in effect (meaning this test can be used) for the duration of the COVID-19 declaration under Section 56 4(b)(1) of the Act, 21 U.S.C. section 360bbb-3(b)(1), unless the authorization is terminated or revoked sooner. Performed at [facility], [place] [address], [place], [place] [address]      Labs: BNP (last 3 results) Recent Labs    02/12/19 2238 09/25/19 2036  BNP 567.4* 875.6*   Basic Metabolic Panel: Recent Labs  Lab 09/22/19 0504 09/23/19 0418 09/25/19 2036 09/26/19 0801 09/27/19 0404  NA 134* 133* 135 137 136  K 4.3 4.4 3.9 3.5 3.5  CL 105 103 104 106 102  CO2 18* 18* 18* 18* 21*  GLUCOSE 170* 195* 150* 147* 112*  BUN 48* 56* 60* 58* 61*  CREATININE 4.43* 4.55* 4.09* 3.87* 3.96*  CALCIUM 8.4* 8.2* 8.5* 8.6* 8.5*  PHOS 6.4* 6.4*  --   --   --    Liver Function Tests: Recent Labs  Lab 09/22/19 0504 09/23/19 0418  ALBUMIN 2.4* 2.2*   No results for input(s): LIPASE, AMYLASE in the last 168 hours. No results for input(s): AMMONIA in the last 168 hours. CBC: Recent Labs  Lab 09/21/19 0350 09/22/19 0504 09/25/19 2036 09/26/19 0416  WBC 9.9 9.8 10.0 8.0  NEUTROABS  --   --  7.2  --   HGB 9.8* 9.7* 7.9* 7.5*  HCT 29.8* 30.2* 25.5* 23.3*  MCV 85.1 87.0 89.8 86.6  PLT 270 249 258 233   Cardiac  Enzymes: No results for input(s): CKTOTAL, CKMB, CKMBINDEX, TROPONINI in the last 168 hours. BNP: Invalid input(s): POCBNP CBG: Recent Labs  Lab 09/26/19 1154 09/26/19 1636 09/26/19 2020 09/27/19 0608 09/27/19 1255  GLUCAP 136* 80 97 110* 137*   D-Dimer Recent Labs    09/25/19 2105  DDIMER 0.96*   Hgb A1c No results for input(s): HGBA1C in the last 72 hours. Lipid Profile No results for input(s): CHOL, HDL, LDLCALC, TRIG, CHOLHDL, LDLDIRECT  in the last 72 hours. Thyroid function studies No results for input(s): TSH, T4TOTAL, T3FREE, THYROIDAB in the last 72 hours.  Invalid input(s): FREET3 Anemia work up No results for input(s): VITAMINB12, FOLATE, FERRITIN, TIBC, IRON, RETICCTPCT in the last 72 hours. Urinalysis    Component Value Date/Time   COLORURINE YELLOW 09/26/2019 0039   APPEARANCEUR HAZY (A) 09/26/2019 0039   LABSPEC 1.012 09/26/2019 0039   PHURINE 6.0 09/26/2019 0039   GLUCOSEU 50 (A) 09/26/2019 0039   HGBUR NEGATIVE 09/26/2019 0039   BILIRUBINUR NEGATIVE 09/26/2019 0039   KETONESUR NEGATIVE 09/26/2019 0039   PROTEINUR >=300 (A) 09/26/2019 0039   UROBILINOGEN 0.2 12/13/2014 2056   NITRITE NEGATIVE 09/26/2019 0039   LEUKOCYTESUR NEGATIVE 09/26/2019 0039   Sepsis Labs Invalid input(s): PROCALCITONIN,  WBC,  LACTICIDVEN Microbiology Recent Results (from the past 240 hour(s))  SARS CORONAVIRUS 2 (TAT 6-24 HRS) Nasopharyngeal Nasopharyngeal Swab     Status: None   Collection Time: 09/19/19 12:55 PM   Specimen: Nasopharyngeal Swab  Result Value Ref Range Status   SARS Coronavirus 2 NEGATIVE NEGATIVE Final    Comment: (NOTE) SARS-CoV-2 target nucleic acids are NOT DETECTED. The SARS-CoV-2 RNA is generally detectable in upper and lower respiratory specimens during the acute phase of infection. Negative results do not preclude SARS-CoV-2 infection, do not rule out co-infections with other pathogens, and should not be used as the sole basis for treatment  or other patient management decisions. Negative results must be combined with clinical observations, patient history, and epidemiological information. The expected result is Negative. Fact Sheet for Patients: SugarRoll.be Fact Sheet for Healthcare Providers: https://www.woods-mathews.com/ This test is not yet approved or cleared by the [place] FDA and  has been authorized for detection and/or diagnosis of SARS-CoV-2 by FDA under an Emergency Use Authorization (EUA). This EUA will remain  in effect (meaning this test can be used) for the duration of the COVID-19 declaration under Section 56 4(b)(1) of the Act, 21 U.S.C. section 360bbb-3(b)(1), unless the authorization is terminated or revoked sooner. Performed at [facility], [place] [address], [place], [place] [address]   SARS CORONAVIRUS 2 (TAT 6-24 HRS) Nasopharyngeal Nasopharyngeal Swab     Status: None   Collection Time: 09/25/19 11:14 PM   Specimen: Nasopharyngeal Swab  Result Value Ref Range Status   SARS Coronavirus 2 NEGATIVE NEGATIVE Final    Comment: (NOTE) SARS-CoV-2 target nucleic acids are NOT DETECTED. The SARS-CoV-2 RNA is generally detectable in upper and lower respiratory specimens during the acute phase of infection. Negative results do not preclude SARS-CoV-2 infection, do not rule out co-infections with other pathogens, and should not be used as the sole basis for treatment or other patient management decisions. Negative results must be combined with clinical observations, patient history, and epidemiological information. The expected result is Negative. Fact Sheet for Patients: SugarRoll.be Fact Sheet for Healthcare Providers: https://www.woods-mathews.com/ This test is not yet approved or cleared by the [place] FDA and  has been authorized for detection and/or diagnosis of SARS-CoV-2 by FDA under an Emergency  Use Authorization (EUA). This EUA will remain  in effect (meaning this test can be used) for the duration of the COVID-19 declaration under Section 56 4(b)(1) of the Act, 21 U.S.C. section 360bbb-3(b)(1), unless the authorization is terminated or revoked sooner. Performed at [facility], [place] [address], [place], [place] [address]      Patient was seen and examined on the day of discharge and was found to be in  stable condition. Time coordinating discharge: 35 minutes including assessment and coordination of care, as well as examination of the patient.   SIGNED:  Dessa Phi, DO [facility] 09/27/2019, 1:16 PM

## 2019-10-05 ENCOUNTER — Other Ambulatory Visit: Payer: Self-pay

## 2019-10-05 ENCOUNTER — Encounter (HOSPITAL_COMMUNITY): Payer: Self-pay | Admitting: Emergency Medicine

## 2019-10-05 ENCOUNTER — Emergency Department (HOSPITAL_COMMUNITY)
Admission: EM | Admit: 2019-10-05 | Discharge: 2019-10-05 | Disposition: A | Discharge status: Home / Self Care | Payer: Medicaid Other | Attending: Emergency Medicine | Admitting: Emergency Medicine

## 2019-10-05 ENCOUNTER — Emergency Department (HOSPITAL_COMMUNITY): Payer: Medicaid Other

## 2019-10-05 DIAGNOSIS — W1811XA Fall from or off toilet without subsequent striking against object, initial encounter: Secondary | ICD-10-CM | POA: Diagnosis not present

## 2019-10-05 DIAGNOSIS — E11649 Type 2 diabetes mellitus with hypoglycemia without coma: Secondary | ICD-10-CM | POA: Diagnosis present

## 2019-10-05 DIAGNOSIS — D649 Anemia, unspecified: Secondary | ICD-10-CM | POA: Diagnosis not present

## 2019-10-05 DIAGNOSIS — I10 Essential (primary) hypertension: Secondary | ICD-10-CM | POA: Diagnosis present

## 2019-10-05 DIAGNOSIS — R531 Weakness: Secondary | ICD-10-CM | POA: Diagnosis not present

## 2019-10-05 DIAGNOSIS — I130 Hypertensive heart and chronic kidney disease with heart failure and stage 1 through stage 4 chronic kidney disease, or unspecified chronic kidney disease: Secondary | ICD-10-CM | POA: Insufficient documentation

## 2019-10-05 DIAGNOSIS — N189 Chronic kidney disease, unspecified: Secondary | ICD-10-CM | POA: Diagnosis not present

## 2019-10-05 DIAGNOSIS — R0602 Shortness of breath: Secondary | ICD-10-CM | POA: Diagnosis not present

## 2019-10-05 DIAGNOSIS — I5032 Chronic diastolic (congestive) heart failure: Secondary | ICD-10-CM | POA: Diagnosis not present

## 2019-10-05 DIAGNOSIS — Z20822 Contact with and (suspected) exposure to covid-19: Secondary | ICD-10-CM | POA: Diagnosis not present

## 2019-10-05 DIAGNOSIS — Z79899 Other long term (current) drug therapy: Secondary | ICD-10-CM | POA: Insufficient documentation

## 2019-10-05 DIAGNOSIS — Z7982 Long term (current) use of aspirin: Secondary | ICD-10-CM | POA: Insufficient documentation

## 2019-10-05 DIAGNOSIS — Z7984 Long term (current) use of oral hypoglycemic drugs: Secondary | ICD-10-CM | POA: Diagnosis not present

## 2019-10-05 DIAGNOSIS — R339 Retention of urine, unspecified: Secondary | ICD-10-CM | POA: Diagnosis not present

## 2019-10-05 DIAGNOSIS — N184 Chronic kidney disease, stage 4 (severe): Secondary | ICD-10-CM | POA: Diagnosis present

## 2019-10-05 DIAGNOSIS — E1122 Type 2 diabetes mellitus with diabetic chronic kidney disease: Secondary | ICD-10-CM | POA: Diagnosis not present

## 2019-10-05 LAB — POCT I-STAT 7, (LYTES, BLD GAS, ICA,H+H)
Acid-base deficit: 4 mmol/L — ABNORMAL HIGH (ref 0.0–2.0)
Bicarbonate: 21.3 mmol/L (ref 20.0–28.0)
Calcium, Ion: 1.24 mmol/L (ref 1.15–1.40)
HCT: 23 % — ABNORMAL LOW (ref 39.0–52.0)
Hemoglobin: 7.8 g/dL — ABNORMAL LOW (ref 13.0–17.0)
O2 Saturation: 96 %
Patient temperature: 98.6
Potassium: 3.9 mmol/L (ref 3.5–5.1)
Sodium: 138 mmol/L (ref 135–145)
TCO2: 22 mmol/L (ref 22–32)
pCO2 arterial: 37.7 mmHg (ref 32.0–48.0)
pH, Arterial: 7.36 (ref 7.350–7.450)
pO2, Arterial: 81 mmHg — ABNORMAL LOW (ref 83.0–108.0)

## 2019-10-05 LAB — COMPREHENSIVE METABOLIC PANEL
ALT: 51 U/L — ABNORMAL HIGH (ref 0–44)
AST: 38 U/L (ref 15–41)
Albumin: 2.3 g/dL — ABNORMAL LOW (ref 3.5–5.0)
Alkaline Phosphatase: 105 U/L (ref 38–126)
Anion gap: 10 (ref 5–15)
BUN: 61 mg/dL — ABNORMAL HIGH (ref 6–20)
CO2: 20 mmol/L — ABNORMAL LOW (ref 22–32)
Calcium: 8.3 mg/dL — ABNORMAL LOW (ref 8.9–10.3)
Chloride: 106 mmol/L (ref 98–111)
Creatinine, Ser: 3.94 mg/dL — ABNORMAL HIGH (ref 0.61–1.24)
GFR calc Af Amer: 20 mL/min — ABNORMAL LOW (ref >60)
GFR calc non Af Amer: 17 mL/min — ABNORMAL LOW (ref >60)
Glucose, Bld: 110 mg/dL — ABNORMAL HIGH (ref 70–99)
Potassium: 4.1 mmol/L (ref 3.5–5.1)
Sodium: 136 mmol/L (ref 135–145)
Total Bilirubin: 0.5 mg/dL (ref 0.3–1.2)
Total Protein: 6 g/dL — ABNORMAL LOW (ref 6.5–8.1)

## 2019-10-05 LAB — URINALYSIS, ROUTINE W REFLEX MICROSCOPIC
Bilirubin Urine: NEGATIVE
Glucose, UA: 50 mg/dL — AB
Hgb urine dipstick: NEGATIVE
Ketones, ur: NEGATIVE mg/dL
Leukocytes,Ua: NEGATIVE
Nitrite: NEGATIVE
Protein, ur: >=300 mg/dL — AB
Specific Gravity, Urine: 1.015 (ref 1.005–1.030)
pH: 5 (ref 5.0–8.0)

## 2019-10-05 LAB — CBC WITH DIFFERENTIAL/PLATELET
Abs Immature Granulocytes: 0.03 10*3/uL (ref 0.00–0.07)
Basophils Absolute: 0 10*3/uL (ref 0.0–0.1)
Basophils Relative: 0 %
Eosinophils Absolute: 0.5 10*3/uL (ref 0.0–0.5)
Eosinophils Relative: 5 %
HCT: 23.4 % — ABNORMAL LOW (ref 39.0–52.0)
Hemoglobin: 7.2 g/dL — ABNORMAL LOW (ref 13.0–17.0)
Immature Granulocytes: 0 %
Lymphocytes Relative: 16 %
Lymphs Abs: 1.5 10*3/uL (ref 0.7–4.0)
MCH: 27.2 pg (ref 26.0–34.0)
MCHC: 30.8 g/dL (ref 30.0–36.0)
MCV: 88.3 fL (ref 80.0–100.0)
Monocytes Absolute: 0.9 10*3/uL (ref 0.1–1.0)
Monocytes Relative: 10 %
Neutro Abs: 6.4 10*3/uL (ref 1.7–7.7)
Neutrophils Relative %: 69 %
Platelets: 222 10*3/uL (ref 150–400)
RBC: 2.65 MIL/uL — ABNORMAL LOW (ref 4.22–5.81)
RDW: 13.7 % (ref 11.5–15.5)
WBC: 9.3 10*3/uL (ref 4.0–10.5)
nRBC: 0 % (ref 0.0–0.2)

## 2019-10-05 LAB — PROCALCITONIN: Procalcitonin: 0.19 ng/mL

## 2019-10-05 LAB — CBG MONITORING, ED: Glucose-Capillary: 72 mg/dL (ref 70–99)

## 2019-10-05 LAB — SARS CORONAVIRUS 2 (TAT 6-24 HRS): SARS Coronavirus 2: NEGATIVE

## 2019-10-05 LAB — ETHANOL: Alcohol, Ethyl (B): <10 mg/dL (ref <10)

## 2019-10-05 LAB — BRAIN NATRIURETIC PEPTIDE: B Natriuretic Peptide: 496.1 pg/mL — ABNORMAL HIGH (ref 0.0–100.0)

## 2019-10-05 MED ORDER — SODIUM CHLORIDE 0.9 % IV SOLN: 2.0000 g | INTRAVENOUS | Status: DC

## 2019-10-05 MED ORDER — SODIUM CHLORIDE 0.9 % IV SOLN: 500.0000 mg | INTRAVENOUS | Status: DC

## 2019-10-05 MED ORDER — FUROSEMIDE 10 MG/ML IJ SOLN
20.0000 mg | Freq: Once | INTRAMUSCULAR | Status: AC
  Administered 2019-10-05: 20 mg via INTRAVENOUS
  Filled 2019-10-05: qty 2

## 2019-10-05 MED ORDER — HYDROCODONE-ACETAMINOPHEN 5-325 MG PO TABS
1.0000 | ORAL_TABLET | Freq: Once | ORAL | Status: AC
  Administered 2019-10-05: 01:00:00 1 via ORAL
  Filled 2019-10-05: qty 1

## 2019-10-05 NOTE — ED Notes (Signed)
Pt given food and drink with permission from Dr. Sedonia Small

## 2019-10-05 NOTE — ED Notes (Signed)
This RN notified [name], Pt's cousin that he is ready for discharge. Pt's cousin states that her dtr will come get him after she gets off work at [employer] over D/c instructions with [name]. Pt's foley bag changed to a leg bag.

## 2019-10-05 NOTE — ED Notes (Signed)
Notified charge RN that pt is blind, has foley and will be waiting on family. Pt will move to [place] until family can pick him up.

## 2019-10-05 NOTE — Discharge Instructions (Addendum)
You were evaluated in the Emergency Department and after careful evaluation, we did not find any emergent condition requiring admission or further testing in the hospital.  Please return to the Emergency Department if you experience any worsening of your condition.  We encourage you to follow up with a primary care provider.  Thank you for allowing [place] to be a part of your care.  Continue your medications as outlined at the time of your discharge from the hospital hospitalization 09/27/2019.

## 2019-10-05 NOTE — Progress Notes (Signed)
Progress note/update:  Patient was consulted on by Dr. Marice Potter earlier this AM and has subsequently failed PT evaluation.  He would be appropriate for direct admission to SNF if possible, since he does not have acute medical issues requiring hospitalization at this time.  However, he also does not have insurance.  The hospital is over capacity at this time and so I have reached out to our UM advisor as well as Case Manager who is reaching out to SW to see if placement is a possibility.  Unfortunately, since he does not have insurance and is likely not Medicaid-eligible, he is very unlikely to be a placement candidate.  We are now having him evaluated for the [facility] [facility] program.  If he doesn't qualify, congregational nursing or charity home health are considerations.  Unfortunately, he does not appear to require admission at this time and so will need to be discharged from the ER.  Dr. Sedonia Small has been notified.  Carlyon Shadow, M.D.

## 2019-10-05 NOTE — Evaluation (Signed)
Physical Therapy Evaluation Patient Details Name: Christopher Davila MRN: [id number] DOB: 1970/12/09 Today's Date: 10/05/2019   History of Present Illness  Pt is an 49 y.o. male with PMH DM, hypertension, CKD stage IIIb, peripheral neuropathy, diabetic retinopathy, bilateral vision loss, glaucoma, chronic diastolic heart failure who presented to ED with increased weakness.  Clinical Impression  Pt in ED with above diagnosis. Pt presenting with unsafe mobility requiring min A for transfers and limited ambulation distance.  In addition to being limited by vision loss, pt demonstrated unsteady, weak gait, and is a fall risk. Pt not safe to be home alone from PT perspective and does not have 24 hr supervision. Unfortunately, rehab options are limited as pt does not have insurance.  Pt currently with functional limitations due to the deficits listed below (see PT Problem List). Pt will benefit from skilled PT to increase their independence and safety with mobility to allow discharge to the venue listed below.       Follow Up Recommendations Supervision/Assistance - 24 hour;SNF    Equipment Recommendations  Other (comment);3in1 (PT)(shower seat)    Recommendations for Other Services       Precautions / Restrictions Precautions Precautions: Fall;Other (comment) Precaution Comments: blind      Mobility  Bed Mobility Overal bed mobility: Needs Assistance Bed Mobility: Supine to Sit;Sit to Supine     Supine to sit: Min assist Sit to supine: Min assist   General bed mobility comments: increased time and effort  Transfers Overall transfer level: Needs assistance Equipment used: Rolling walker (2 wheeled) Transfers: Sit to/from Stand Sit to Stand: Min assist;From elevated surface         General transfer comment: min A and increased time for elevated stretcher; pt shakey; cues for safe hand placement  Ambulation/Gait Ambulation/Gait assistance: Min assist Gait Distance (Feet): 30  Feet Assistive device: Rolling walker (2 wheeled) Gait Pattern/deviations: Decreased stride length;Wide base of support Gait velocity: decreased   General Gait Details: Required cues for direction and assist with RW due to legally blind; however, additionally required min A for balance and close guarding as pt weak and unsteady .  Pt was unable to tolerate dynamic gait activities due to too unsteady.    Stairs            Wheelchair Mobility    Modified Rankin (Stroke Patients Only)       Balance Overall balance assessment: Needs assistance Sitting-balance support: No upper extremity supported;Feet supported Sitting balance-Leahy Scale: Good     Standing balance support: Bilateral upper extremity supported;During functional activity Standing balance-Leahy Scale: Fair                               Pertinent Vitals/Pain Pain Assessment: No/denies pain    Home Living Family/patient expects to be discharged to:: Private residence Living Arrangements: Other relatives Available Help at Discharge: Family;Available PRN/intermittently Type of Home: House Home Access: Stairs to enter Entrance Stairs-Rails: Psychiatric nurse of Steps: 4 Home Layout: One level Home Equipment: Walker - 2 wheels      Prior Function Level of Independence: Needs assistance   Gait / Transfers Assistance Needed: Pt has been using RW for 2 weeks; Reports could ambulate in community  ADL's / Homemaking Assistance Needed: Reports still independent with ADLS; reports cousin doing IADLs recently  Comments: Pt was working at [employer]  in December and independent ; since that time on steady decline in mobility  Hand Dominance        Extremity/Trunk Assessment   Upper Extremity Assessment Upper Extremity Assessment: RUE deficits/detail;LUE deficits/detail RUE Deficits / Details: ROM WFL; MMT 4/5 LUE Deficits / Details: ROM WFL; MMT 4/5    Lower Extremity  Assessment Lower Extremity Assessment: LLE deficits/detail;RLE deficits/detail RLE Deficits / Details: ROM WFL; MMT 4/5; pitting edema in lower leg and foot LLE Deficits / Details: ROM WFL; MMT 4/5; pitting edema in lower leg and foot    Cervical / Trunk Assessment Cervical / Trunk Assessment: Normal  Communication   Communication: No difficulties  Cognition Arousal/Alertness: Lethargic Behavior During Therapy: Flat affect Overall Cognitive Status: Within Functional Limits for tasks assessed                                        General Comments General comments (skin integrity, edema, etc.): VSS in all positions    Exercises     Assessment/Plan    PT Assessment Patient needs continued PT services  PT Problem List Decreased strength;Decreased mobility;Decreased safety awareness;Decreased activity tolerance;Decreased balance;Decreased knowledge of use of DME       PT Treatment Interventions DME instruction;Therapeutic exercise;Gait training;Balance training;Stair training;Functional mobility training;Therapeutic activities;Patient/family education    PT Goals (Current goals can be found in the Care Plan section)  Acute Rehab PT Goals Patient Stated Goal: get stronger PT Goal Formulation: With patient Time For Goal Achievement: 10/19/19 Potential to Achieve Goals: Fair    Frequency Min 3X/week   Barriers to discharge Decreased caregiver support      Co-evaluation               AM-PAC PT "6 Clicks" Mobility  Outcome Measure Help needed turning from your back to your side while in a flat bed without using bedrails?: A Little Help needed moving from lying on your back to sitting on the side of a flat bed without using bedrails?: A Little Help needed moving to and from a bed to a chair (including a wheelchair)?: A Little Help needed standing up from a chair using your arms (e.g., wheelchair or bedside chair)?: A Little Help needed to walk in  hospital room?: A Little Help needed climbing 3-5 steps with a railing? : A Lot 6 Click Score: 17    End of Session Equipment Utilized During Treatment: Gait belt Activity Tolerance: Patient limited by lethargy Patient left: in bed;with call bell/phone within reach Nurse Communication: Mobility status(pt request coffee) PT Visit Diagnosis: Unsteadiness on feet (R26.81);Muscle weakness (generalized) (M62.81);History of falling (Z91.81)    Time: 7846-9629 PT Time Calculation (min) (ACUTE ONLY): 30 min   Charges:   PT Evaluation $PT Eval Moderate Complexity: 1 Mod          Maggie Font, PT Acute Rehab Services Pager [phone] [facility] Rehab [phone] [facility] [phone]  Karlton Lemon 10/05/2019, 10:51 AM

## 2019-10-05 NOTE — ED Notes (Signed)
Patient taken to CT.

## 2019-10-05 NOTE — Discharge Planning (Signed)
Fuller Mandril, RN, BSN, [place] [phone] Pt qualifies for charity home health PT.  [facility] ordered through [facility].  Janae Sauce, RN of [facility] verified charity enrollment.

## 2019-10-05 NOTE — Discharge Planning (Signed)
RNCM consulted regarding home health services for uninsured pt.  RNCM referred pt for charity home health through [facility].  RNCM spoke with Janae Sauce, RN who will complete charity paperwork and advise of acceptance or denial.

## 2019-10-05 NOTE — ED Provider Notes (Signed)
[place] EMERGENCY DEPARTMENT Provider Note   CSN: [id number] Arrival date & time: 10/05/19  0030     History Chief Complaint  Patient presents with  . Fall  . Weakness    Christopher Davila is a 49 y.o. male.  The history is provided by the patient.  Weakness Severity:  Severe Onset quality:  Gradual Timing:  Constant Progression:  Worsening Chronicity:  New Relieved by:  Nothing Worsened by:  Activity Associated symptoms: difficulty walking, falls and shortness of breath   Associated symptoms: no abdominal pain, no chest pain, no diarrhea, no fever, no loss of consciousness, no syncope and no vomiting   Patient with extensive medical history including chronic kidney disease, diabetes, blindness, gastroparesis, hypertension, neuropathy presents with fall. Patient reports he was using his walker to go to the bathroom when his legs gave out and he fell on his knees.  No head injury or LOC.  He denies any traumatic injury.  No extremity injury.  No new back pain or injury He reports this fatigue has been increasing in the past several weeks. Due to being unable to go to the bathroom due to the fall he did have incontinence.    Past Medical History:  Diagnosis Date  . Abdominal pain 12/28/2012  . Acute kidney injury superimposed on chronic kidney disease ([place])   . Blindness 09/04/2019  . Chest pain 02/14/2014  . Diabetes mellitus   . Dyspnea   . Epigastric abdominal pain   . Gastroparesis due to DM ([place])   . Hypertension   . Hypoalbuminemia 04/17/2019  . Hypoxia   . Nausea   . Neuropathy of lower extremity   . Oral candidiasis 06/26/2012  . Pancreatitis 06/2012  . Pancreatitis 06/26/2012  . Weight loss 06/26/2012  . Wheezing     Patient Active Problem List   Diagnosis Date Noted  . Acute on chronic diastolic CHF (congestive heart failure) ([place]) 09/27/2019  . Vision loss of left eye 09/21/2019  . Acute kidney injury superimposed on chronic kidney  disease ([place]) 09/19/2019  . Hyponatremia 09/19/2019  . Homeless 09/19/2019  . Anemia of chronic disease 09/19/2019  . Vision loss, left eye 09/19/2019  . Acute loss of vision, right 06/16/2019  . Hypertensive urgency 04/17/2019  . Hypokalemia 04/17/2019  . Normocytic anemia 04/17/2019  . Hyperglycemia   . CKD (chronic kidney disease) stage 4, GFR 15-29 ml/min ([facility])   . Alcohol use   . Gastroparesis due to DM ([place])   . Essential hypertension   . Hyperlipidemia   . Type 2 diabetes mellitus with hypoglycemia ([place]) 06/26/2012  . Hypertension 06/26/2012    No past surgical history on file.     Family History  Problem Relation Age of Onset  . Diabetes Mother   . Lung disease Mother   . Hypertension Mother   . Diabetes Maternal Aunt   . CAD Maternal Aunt   . CAD Cousin     Social History   Tobacco Use  . Smoking status: Former Smoker    Packs/day: 0.00    Types: Cigarettes    Quit date: 03/22/2015    Years since quitting: 4.5  . Smokeless tobacco: Never Used  Substance Use Topics  . Alcohol use: Not Currently  . Drug use: No    Home Medications Prior to Admission medications   Medication Sig Start Date End Date Taking? Authorizing Provider  aspirin EC 81 MG tablet Take 1 tablet (81 mg total) by mouth daily. 05/24/15  Ghimire, Henreitta Leber, MD  brimonidine (ALPHAGAN) 0.2 % ophthalmic solution Place 1 drop into the left eye 2 (two) times daily. 09/23/19   Geradine Girt, DO  carvedilol (COREG) 25 MG tablet Take 1 tablet (25 mg total) by mouth 2 (two) times daily with a meal. Patient taking differently: Take 25 mg by mouth 2 (two) times daily with a meal. Taking once daily 02/19/19   Gladys Damme, MD  dorzolamide-timolol (COSOPT) 22.3-6.8 MG/ML ophthalmic solution Place 1 drop into the left eye 2 (two) times daily. 09/23/19   Geradine Girt, DO  furosemide (LASIX) 40 MG tablet Take 1 tablet (40 mg total) by mouth daily. 09/27/19 12/26/19  Dessa Phi, DO  gabapentin  (NEURONTIN) 300 MG capsule Take 1 capsule (300 mg total) by mouth 2 (two) times daily. Patient taking differently: Take 300 mg by mouth 3 (three) times daily.  09/23/19   Geradine Girt, DO  glipiZIDE (GLUCOTROL) 10 MG tablet Take 1 tablet (10 mg total) by mouth 2 (two) times daily. 09/23/19   Geradine Girt, DO  hydrALAZINE (APRESOLINE) 25 MG tablet Take 0.5 tablets (12.5 mg total) by mouth 3 (three) times daily. 09/23/19   Geradine Girt, DO  latanoprost (XALATAN) 0.005 % ophthalmic solution Place 1 drop into the left eye at bedtime. 09/23/19   Geradine Girt, DO  sodium bicarbonate 650 MG tablet Take 1 tablet (650 mg total) by mouth 3 (three) times daily. 09/23/19   Geradine Girt, DO    Allergies    Morphine and Morphine and related  Review of Systems   Review of Systems  Constitutional: Positive for fatigue. Negative for fever.  Respiratory: Positive for shortness of breath.   Cardiovascular: Positive for leg swelling. Negative for chest pain and syncope.  Gastrointestinal: Negative for abdominal pain, diarrhea and vomiting.  Musculoskeletal: Positive for falls. Negative for back pain and neck pain.  Neurological: Positive for weakness. Negative for loss of consciousness.  All other systems reviewed and are negative.   Physical Exam Updated Vital Signs BP 111/76 (BP Location: Right Arm)   Pulse 81   Temp 97.7 F (36.5 C) (Oral)   Resp 18   Ht 1.6 m (5\' 3" )   Wt 60.8 kg   SpO2 98%   BMI 23.74 kg/m   Physical Exam CONSTITUTIONAL: Chronically ill-appearing HEAD: Normocephalic/atraumatic EYES: Blind ENMT: Mucous membranes moist, poor dentition NECK: supple no meningeal signs SPINE/BACK:entire spine nontender, no bruising/crepitance/stepoffs noted to spine CV: S1/S2 noted, no murmurs/rubs/gallops noted LUNGS: Scattered crackles bilaterally, tachypnea noted ABDOMEN: soft, nontender, no rebound or guarding, bowel sounds noted throughout abdomen, edema noted GU:no cva  tenderness NEURO: Pt is awake/alert/appropriate, moves all extremitiesx4.  No facial droop.  Patient is able to move all extremities, but he does have symmetric weakness with flexion of both hips. EXTREMITIES: pulses normal/equal, full ROM, pitting edema no lower extremities.  All other extremities/joints palpated/ranged and nontender SKIN: warm, color normal PSYCH: no abnormalities of mood noted, alert and oriented to situation  ED Results / Procedures / Treatments   Labs (all labs ordered are listed, but only abnormal results are displayed) Labs Reviewed  CBC WITH DIFFERENTIAL/PLATELET - Abnormal; Notable for the following components:      Result Value   RBC 2.65 (*)    Hemoglobin 7.2 (*)    HCT 23.4 (*)    All other components within normal limits  COMPREHENSIVE METABOLIC PANEL - Abnormal; Notable for the following components:   CO2 20 (*)  Glucose, Bld 110 (*)    BUN 61 (*)    Creatinine, Ser 3.94 (*)    Calcium 8.3 (*)    Total Protein 6.0 (*)    Albumin 2.3 (*)    ALT 51 (*)    GFR calc non Af Amer 17 (*)    GFR calc Af Amer 20 (*)    All other components within normal limits  BRAIN NATRIURETIC PEPTIDE - Abnormal; Notable for the following components:   B Natriuretic Peptide 496.1 (*)    All other components within normal limits  URINALYSIS, ROUTINE W REFLEX MICROSCOPIC - Abnormal; Notable for the following components:   APPearance HAZY (*)    Glucose, UA 50 (*)    Protein, ur >=300 (*)    Bacteria, UA RARE (*)    All other components within normal limits  POCT I-STAT 7, (LYTES, BLD GAS, ICA,H+H) - Abnormal; Notable for the following components:   pO2, Arterial 81.0 (*)    Acid-base deficit 4.0 (*)    HCT 23.0 (*)    Hemoglobin 7.8 (*)    All other components within normal limits  SARS CORONAVIRUS 2 (TAT 6-24 HRS)  URINE CULTURE  ETHANOL  PROCALCITONIN    EKG EKG Interpretation  Date/Time:  Tuesday October 05 2019 00:37:25 EDT Ventricular Rate:  81 PR  Interval:    QRS Duration: 95 QT Interval:  398 QTC Calculation: 462 R Axis:   72 Text Interpretation: Sinus rhythm No significant change since last tracing Confirmed by Ripley Fraise [phone]) on 10/05/2019 12:46:59 AM   Radiology CT Chest Wo Contrast  Result Date: 10/05/2019 CLINICAL DATA:  Shortness of breath EXAM: CT CHEST WITHOUT CONTRAST TECHNIQUE: Multidetector CT imaging of the chest was performed following the standard protocol without IV contrast. COMPARISON:  11/21/2007 FINDINGS: Cardiovascular: Heart is mildly enlarged.  Aorta is normal caliber. Mediastinum/Nodes: Mediastinal adenopathy. Prevascular nodes have a short axis diameter of 11 mm. Similarly sized AP window and paratracheal lymph nodes. Lungs/Pleura: Moderate bilateral pleural effusions. Bilateral lower lobe and left upper lobe airspace opacities. This could reflect asymmetric edema or infection. Upper Abdomen: Imaging into the upper abdomen shows no acute findings. Musculoskeletal: Chest wall soft tissues are unremarkable. No acute bony abnormality. IMPRESSION: Moderate bilateral pleural effusions. Bilateral airspace disease, left greater than right could reflect asymmetric edema or infection. Cardiomegaly. Mildly enlarged mediastinal lymph nodes. Electronically Signed   By: Rolm Baptise M.D.   On: 10/05/2019 03:19   DG Chest Port 1 View  Result Date: 10/05/2019 CLINICAL DATA:  Shortness of breath EXAM: PORTABLE CHEST 1 VIEW COMPARISON:  09/27/2019 FINDINGS: Increasing perihilar and lower lobe airspace opacities. Possible small left effusion. Heart is borderline in size. No acute bony abnormality. IMPRESSION: Increasing perihilar and lower lobe opacities concerning for pneumonia. Suspect small left effusion. Electronically Signed   By: Rolm Baptise M.D.   On: 10/05/2019 01:32    Procedures Procedures   Medications Ordered in ED Medications  cefTRIAXone (ROCEPHIN) 2 g in sodium chloride 0.9 % 100 mL IVPB (has no  administration in time range)  azithromycin (ZITHROMAX) 500 mg in sodium chloride 0.9 % 250 mL IVPB (has no administration in time range)  HYDROcodone-acetaminophen (NORCO/VICODIN) 5-325 MG per tablet 1 tablet (1 tablet Oral Given 10/05/19 0125)    ED Course  I have reviewed the triage vital signs and the nursing notes.  Pertinent labs & imaging results that were available during my care of the patient were reviewed by me and considered in  my medical decision making (see chart for details).    MDM Rules/Calculators/A&P                      1:32 AM Patient with extensive medical conditions presents after fall he reports his legs "gave out"and he fell to his knees but did not sustain an injury.  He reports his generalized weakness has been ongoing for weeks.  He typically can use a walker.  He was recently admitted to the hospital. Labs and imaging are pending at this time No new traumatic injury.  I do not suspect an acute myelopathy has this generalized weakness has been ongoing for weeks. 2:28 AM x-ray shows persistent infiltrates similar to prior.  Patient appears short of breath. We'll obtain CT chest without contrast to evaluate for infiltrates.  6:00 AM Patient has been monitored for several hours in the emergency [facility].  Patient is now extremely somnolent but is arousable and answers questions appropriately. CT chest reveals effusions  Patient is a very difficult historian as he keeps falling asleep during exam.  There is no hypercapnia. Patient is also having some urinary retention.  Foley cath. Placed Pt reports he has had difficulty urinating previously.  Denies fecal incontinence 6:25 AM Seen by dr fair with hospitalists She has evaluated patient She suspects that CT findings are baseline for him due to renal failure Labs also at baseline Pt now reports his weakness is common and less likely an acute issue Plan is to have physical therapy consult and if fails PT he will  be admitted 7:00 AM Signed out to dr Sedonia Small at shift change    This patient presents to the ED for concern of generalized weakness, this involves an extensive number of treatment options, and is a complaint that carries with it a high risk of complications and morbidity.  The differential diagnosis includes dehydration, renal failure, CHF, stroke   Lab Tests:   I Ordered, reviewed, and interpreted labs, which included metabolic panel, blood count, bnp  Medicines ordered:   I ordered medication vicodin  For pain   Imaging Studies ordered:   I ordered imaging studies which included chest xray and CT chest  I independently visualized and interpreted imaging which showed pleural effusions and infiltrate  Additional history obtained:    Previous records obtained and reviewed   Consultations Obtained:   I consulted Dr Marice Potter  and discussed lab and imaging findings  Reevaluation:  After the interventions stated above, I reevaluated the patient and found to be stable   Final Clinical Impression(s) / ED Diagnoses Final diagnoses:  Weakness  Chronic kidney disease, unspecified CKD stage    Rx / DC Orders ED Discharge Orders    None       Ripley Fraise, MD 10/05/19 0700

## 2019-10-05 NOTE — Consult Note (Addendum)
Reason for Consult: Weakness  Referring Physician: EDP Wickline  Rik Wadel is an 49 y.o. male with PMH DM, hypertension, CKD stage IIIb, peripheral neuropathy, diabetic retinopathy, bilateral vision loss, glaucoma, chronicdiastolicheart failure who presented to ED with increased weakness.    HPI: Patient reports that he lives with his cousin and he got up to go to the bathroom and felt weak. Reports falling from commode, did not hit head or lose consciousness. He does report episodes like this previously where his legs will "give out." He walks with a walker at baseline. Reports swelling in his legs and that he has been taking Lasix. He took a dose yesterday as he had not gone to the bathroom all day. Denies numbness or tingling in his legs. Denies back pain, trauma, cancer history, weight loss, nights sweats. Denies headache, dizziness, fever, chills, cough, SOB, chest pain, abdominal pain, nausea, vomiting, diarrhea, constipation, dysuria, hematuria, hematochezia, melena, speech difficulty, trouble eating, confusion or any other complaints.   Past Medical History:  Diagnosis Date  . Abdominal pain 12/28/2012  . Acute kidney injury superimposed on chronic kidney disease ([place])   . Blindness 09/04/2019  . Chest pain 02/14/2014  . Diabetes mellitus   . Dyspnea   . Epigastric abdominal pain   . Gastroparesis due to DM ([place])   . Hypertension   . Hypoalbuminemia 04/17/2019  . Hypoxia   . Nausea   . Neuropathy of lower extremity   . Oral candidiasis 06/26/2012  . Pancreatitis 06/2012  . Pancreatitis 06/26/2012  . Weight loss 06/26/2012  . Wheezing     No past surgical history on file.  Family History  Problem Relation Age of Onset  . Diabetes Mother   . Lung disease Mother   . Hypertension Mother   . Diabetes Maternal Aunt   . CAD Maternal Aunt   . CAD Cousin     Social History:  reports that he quit smoking about 4 years ago. His smoking use included cigarettes. He smoked 0.00  packs per day. He has never used smokeless tobacco. He reports previous alcohol use. He reports that he does not use drugs.  Allergies:  Allergies  Allergen Reactions  . Morphine Itching    Pt prefers not to be given this drug  . Morphine And Related Itching and Other (See Comments)    Pt prefers not to be given this drug    Medications: I have reviewed the patient's current medications.  Results for orders placed or performed during the hospital encounter of 10/05/19 (from the past 48 hour(s))  CBC with Differential/Platelet     Status: Abnormal   Collection Time: 10/05/19  1:18 AM  Result Value Ref Range   WBC 9.3 4.0 - 10.5 K/uL   RBC 2.65 (L) 4.22 - 5.81 MIL/uL   Hemoglobin 7.2 (L) 13.0 - 17.0 g/dL   HCT 23.4 (L) 39.0 - 52.0 %   MCV 88.3 80.0 - 100.0 fL   MCH 27.2 26.0 - 34.0 pg   MCHC 30.8 30.0 - 36.0 g/dL   RDW 13.7 11.5 - 15.5 %   Platelets 222 150 - 400 K/uL   nRBC 0.0 0.0 - 0.2 %   Neutrophils Relative % 69 %   Neutro Abs 6.4 1.7 - 7.7 K/uL   Lymphocytes Relative 16 %   Lymphs Abs 1.5 0.7 - 4.0 K/uL   Monocytes Relative 10 %   Monocytes Absolute 0.9 0.1 - 1.0 K/uL   Eosinophils Relative 5 %   Eosinophils  Absolute 0.5 0.0 - 0.5 K/uL   Basophils Relative 0 %   Basophils Absolute 0.0 0.0 - 0.1 K/uL   Immature Granulocytes 0 %   Abs Immature Granulocytes 0.03 0.00 - 0.07 K/uL    Comment: Performed at [facility], [place] [address] [place], [place] [address]  Comprehensive metabolic panel     Status: Abnormal   Collection Time: 10/05/19  1:18 AM  Result Value Ref Range   Sodium 136 135 - 145 mmol/L   Potassium 4.1 3.5 - 5.1 mmol/L   Chloride 106 98 - 111 mmol/L   CO2 20 (L) 22 - 32 mmol/L   Glucose, Bld 110 (H) 70 - 99 mg/dL    Comment: Glucose reference range applies only to samples taken after fasting for at least 8 hours.   BUN 61 (H) 6 - 20 mg/dL   Creatinine, Ser 3.94 (H) 0.61 - 1.24 mg/dL   Calcium 8.3 (L) 8.9 - 10.3 mg/dL   Total Protein 6.0 (L) 6.5  - 8.1 g/dL   Albumin 2.3 (L) 3.5 - 5.0 g/dL   AST 38 15 - 41 U/L   ALT 51 (H) 0 - 44 U/L   Alkaline Phosphatase 105 38 - 126 U/L   Total Bilirubin 0.5 0.3 - 1.2 mg/dL   GFR calc non Af Amer 17 (L) >60 mL/min   GFR calc Af Amer 20 (L) >60 mL/min   Anion gap 10 5 - 15    Comment: Performed at [facility] [address], [place], [place] [address]  Brain natriuretic peptide     Status: Abnormal   Collection Time: 10/05/19  1:18 AM  Result Value Ref Range   B Natriuretic Peptide 496.1 (H) 0.0 - 100.0 pg/mL    Comment: Performed at [facility] [address] [place], [place] [address]  SARS CORONAVIRUS 2 (TAT 6-24 HRS) Nasopharyngeal Nasopharyngeal Swab     Status: None   Collection Time: 10/05/19  1:27 AM   Specimen: Nasopharyngeal Swab  Result Value Ref Range   SARS Coronavirus 2 NEGATIVE NEGATIVE    Comment: (NOTE) SARS-CoV-2 target nucleic acids are NOT DETECTED. The SARS-CoV-2 RNA is generally detectable in upper and lower respiratory specimens during the acute phase of infection. Negative results do not preclude SARS-CoV-2 infection, do not rule out co-infections with other pathogens, and should not be used as the sole basis for treatment or other patient management decisions. Negative results must be combined with clinical observations, patient history, and epidemiological information. The expected result is Negative. Fact Sheet for Patients: SugarRoll.be Fact Sheet for Healthcare Providers: https://www.woods-mathews.com/ This test is not yet approved or cleared by the [place] FDA and  has been authorized for detection and/or diagnosis of SARS-CoV-2 by FDA under an Emergency Use Authorization (EUA). This EUA will remain  in effect (meaning this test can be used) for the duration of the COVID-19 declaration under Section 56 4(b)(1) of the Act, 21 U.S.C. section 360bbb-3(b)(1), unless the authorization is terminated  or revoked sooner. Performed at [facility], [place] [address] [place], [place] [address]   Ethanol     Status: None   Collection Time: 10/05/19  1:28 AM  Result Value Ref Range   Alcohol, Ethyl (B) <10 <10 mg/dL    Comment: (NOTE) Lowest detectable limit for serum alcohol is 10 mg/dL. For medical purposes only. Performed at [facility], [place] [address] [place], [place] [address]   I-STAT 7, (  LYTES, BLD GAS, ICA, H+H)     Status: Abnormal   Collection Time: 10/05/19  4:40 AM  Result Value Ref Range   pH, Arterial 7.360 7.350 - 7.450   pCO2 arterial 37.7 32.0 - 48.0 mmHg   pO2, Arterial 81.0 (L) 83.0 - 108.0 mmHg   Bicarbonate 21.3 20.0 - 28.0 mmol/L   TCO2 22 22 - 32 mmol/L   O2 Saturation 96.0 %   Acid-base deficit 4.0 (H) 0.0 - 2.0 mmol/L   Sodium 138 135 - 145 mmol/L   Potassium 3.9 3.5 - 5.1 mmol/L   Calcium, Ion 1.24 1.15 - 1.40 mmol/L   HCT 23.0 (L) 39.0 - 52.0 %   Hemoglobin 7.8 (L) 13.0 - 17.0 g/dL   Patient temperature 98.6 F    Collection site RADIAL, ALLEN'S TEST ACCEPTABLE    Drawn by RT    Sample type ARTERIAL   Urinalysis, Routine w reflex microscopic     Status: Abnormal   Collection Time: 10/05/19  5:08 AM  Result Value Ref Range   Color, Urine YELLOW YELLOW   APPearance HAZY (A) CLEAR   Specific Gravity, Urine 1.015 1.005 - 1.030   pH 5.0 5.0 - 8.0   Glucose, UA 50 (A) NEGATIVE mg/dL   Hgb urine dipstick NEGATIVE NEGATIVE   Bilirubin Urine NEGATIVE NEGATIVE   Ketones, ur NEGATIVE NEGATIVE mg/dL   Protein, ur >=300 (A) NEGATIVE mg/dL   Nitrite NEGATIVE NEGATIVE   Leukocytes,Ua NEGATIVE NEGATIVE   RBC / HPF 0-5 0 - 5 RBC/hpf   WBC, UA 0-5 0 - 5 WBC/hpf   Bacteria, UA RARE (A) NONE SEEN    Comment: Performed at [facility], [address], [place], [place] [address]    CT Chest Wo Contrast  Result Date: 10/05/2019 CLINICAL DATA:  Shortness of breath EXAM: CT CHEST WITHOUT CONTRAST TECHNIQUE: Multidetector CT imaging of the chest  was performed following the standard protocol without IV contrast. COMPARISON:  11/21/2007 FINDINGS: Cardiovascular: Heart is mildly enlarged.  Aorta is normal caliber. Mediastinum/Nodes: Mediastinal adenopathy. Prevascular nodes have a short axis diameter of 11 mm. Similarly sized AP window and paratracheal lymph nodes. Lungs/Pleura: Moderate bilateral pleural effusions. Bilateral lower lobe and left upper lobe airspace opacities. This could reflect asymmetric edema or infection. Upper Abdomen: Imaging into the upper abdomen shows no acute findings. Musculoskeletal: Chest wall soft tissues are unremarkable. No acute bony abnormality. IMPRESSION: Moderate bilateral pleural effusions. Bilateral airspace disease, left greater than right could reflect asymmetric edema or infection. Cardiomegaly. Mildly enlarged mediastinal lymph nodes. Electronically Signed   By: Rolm Baptise M.D.   On: 10/05/2019 03:19   DG Chest Port 1 View  Result Date: 10/05/2019 CLINICAL DATA:  Shortness of breath EXAM: PORTABLE CHEST 1 VIEW COMPARISON:  09/27/2019 FINDINGS: Increasing perihilar and lower lobe airspace opacities. Possible small left effusion. Heart is borderline in size. No acute bony abnormality. IMPRESSION: Increasing perihilar and lower lobe opacities concerning for pneumonia. Suspect small left effusion. Electronically Signed   By: Rolm Baptise M.D.   On: 10/05/2019 01:32    Review of Systems  Constitutional: Negative for activity change, appetite change, chills, diaphoresis, fever and unexpected weight change.  HENT: Negative.   Eyes: Negative.   Respiratory: Negative.   Cardiovascular: Positive for leg swelling. Negative for chest pain and palpitations.  Gastrointestinal: Negative.   Endocrine: Negative.   Genitourinary: Positive for difficulty urinating. Negative for dysuria and hematuria.  Musculoskeletal: Negative for back pain, joint swelling, neck pain  and neck stiffness.  Skin: Negative for color  change, pallor, rash and wound.  Allergic/Immunologic: Negative.   Neurological: Positive for weakness. Negative for dizziness, tremors, seizures, syncope, facial asymmetry, speech difficulty, light-headedness, numbness and headaches.  Hematological: Negative.   Psychiatric/Behavioral: Negative.    Blood pressure 114/70, pulse 73, temperature 97.7 F (36.5 C), temperature source Oral, resp. rate 18, height 5\' 3"  (1.6 m), weight 60.8 kg, SpO2 100 %. Physical Exam  Constitutional: He is oriented to person, place, and time. He appears well-developed and well-nourished. No distress.  HENT:  Head: Normocephalic and atraumatic.  Right Ear: External ear normal.  Left Ear: External ear normal.  Eyes:  Eyes closed.  Cardiovascular: Normal rate, regular rhythm and normal heart sounds.  Respiratory: Effort normal and breath sounds normal. No respiratory distress. He has no wheezes. He has no rales. He exhibits no tenderness.  GI: Soft. Bowel sounds are normal. He exhibits no distension. There is no abdominal tenderness. There is no rebound and no guarding.  Musculoskeletal:        General: Edema present. Normal range of motion.     Cervical back: Normal range of motion.     Comments: 2+ lower extremity edema bilaterally to knees.  Neurological: He is alert and oriented to person, place, and time. No cranial nerve deficit.  Strength 3/5 in bilateral lower extremities. Strength 5/5 BUE.  Skin: Skin is warm and dry. He is not diaphoretic.  Psychiatric: He has a normal mood and affect. His behavior is normal. Thought content normal.    Assessment/Plan: 49 y.o. male with PMH DM, hypertension, CKD stage IIIb, peripheral neuropathy, diabetic retinopathy, bilateral vision loss, glaucoma, chronicdiastolicheart failure who presented to ED with increased weakness. Chronically very ill patient however labs stable today as well as clinical presentation.   Weakness -presentation similar per patient to  previous episodes of weakness -fall prior to admission -walks with walker at baseline -no red flag symptoms; no back pain -recently discharged from hospital on 4/12 and inpatient/rehab was not recommended on discharge -will have PT see patient in ED to see if he is safe for discharge; EDP will re-page for admission if needed -situation complicated by social situation; lack of insurance and patient is self-pay but does have a job  Urinary retention -Urinary retention x1 day; foley catheter placed in ED after patient unable to spontaneously void and 700 mL noted on bladder scan -If able to discharge; will need outpatient f/u for removal and voiding trial   Chronic diastolicCHF exacerbation -BNP 496; lower than previous admissions -no O2 requirement or respiratory complaints -no evidence of infection despite CT Chest read; will hold on abx at this time -Procal ordered -Lasix 20 mg IV ordered to be given in ED  CKD stage4  -Cr at baseline -Has been seen by Nephrology inpatient previous admission. He needs to follow up with [facility] as outpatient -CKD complicates ability to diurese effectively   [employer] 10/05/2019, 6:52 AM

## 2019-10-05 NOTE — ED Notes (Signed)
Pt arrived to Rm 49 via stretcher. Pt's belongings bag on stretcher. D/C paperwork noted in bag. Pt declined po foods/fluid - Warm blanket given as requested. F/C intact.

## 2019-10-05 NOTE — ED Provider Notes (Addendum)
  Provider Note MRN:  [id number]  Arrival date & time: 10/05/19    ED Course and Medical Decision Making  Assumed care from Dr. Christy Gentles at shift change.  Fall, weakness, similar presentation and admission recently.  Overnight hospitalist consulted for admission, requesting PT evaluation to determine need for admission versus discharge.  Awaiting PT.  12 PM update: Patient did poorly with PT evaluation, not safe for living home alone without 24-hour assistance.  Hospitalist team reconsulted for admission, they will either admit or find further home assistance through case management/social work.  Plan is for patient to be discharged with home health services, case management is working on it.  Until then well hold in the emergency department.  Signed out to default provider.  Procedures  Final Clinical Impressions(s) / ED Diagnoses     ICD-10-CM   1. Weakness  R53.1   2. Chronic kidney disease, unspecified CKD stage  N18.9   3. Urinary retention  R33.9   4. Chronic anemia  D64.9     ED Discharge Orders    None      Discharge Instructions   None     Barth Kirks. Sedonia Small, [place] [email or web address]    Maudie Flakes, MD 10/05/19 1232    Maudie Flakes, MD 10/05/19 1326

## 2019-10-05 NOTE — ED Notes (Addendum)
Christopher Davila, family member, advised she is out front waiting on pt. Pt to be escorted to lobby via w/c. Pt's belongings bag w/clothing and d/c paperwork given.

## 2019-10-05 NOTE — ED Triage Notes (Signed)
Patient from home, was in bathroom and fell from commode.  Patient with increased weakness since the fall, poor urine output.  Patient is incontinent at this time.  Patient does have pedal edema, new diagnosis of CHF.  Patient does have clear bs.  Patient usually able to walk with walker, but not today.

## 2019-10-07 LAB — URINE CULTURE: Culture: 60000 — AB

## 2019-10-08 ENCOUNTER — Telehealth: Payer: Self-pay

## 2019-10-08 NOTE — Telephone Encounter (Signed)
Post ED Visit - Positive Culture Follow-up  Culture report reviewed by antimicrobial stewardship pharmacist: [place] Team []  Elenor Quinones, Pharm.D. []  Heide Guile, Pharm.D., BCPS AQ-ID []  Parks Neptune, Pharm.D., BCPS []  Alycia Rossetti, Pharm.D., BCPS []  [place], Pharm.D., BCPS, AAHIVP []  Legrand Como, Pharm.D., BCPS, AAHIVP []  Salome Arnt, PharmD, BCPS []  Johnnette Gourd, PharmD, BCPS []  Hughes Better, PharmD, BCPS []  Leeroy Cha, PharmD []  Laqueta Linden, PharmD, BCPS []  Albertina Parr, PharmD [place] Team []  Leodis Sias, PharmD []  Lindell Spar, PharmD []  Royetta Asal, PharmD []  Graylin Shiver, Rph []  Rema Fendt) Glennon Mac, PharmD []  Arlyn Dunning, PharmD []  Netta Cedars, PharmD []  Dia Sitter, PharmD []  Leone Haven, PharmD []  Gretta Arab, PharmD []  Theodis Shove, PharmD []  Peggyann Juba, PharmD []  Reuel Boom, PharmD   Positive urine culture and no further patient follow-up is required at this time.  Genia Del 10/08/2019, 10:21 AM

## 2019-10-13 ENCOUNTER — Other Ambulatory Visit: Payer: Self-pay

## 2019-10-13 ENCOUNTER — Inpatient Hospital Stay (HOSPITAL_COMMUNITY)
Admission: EM | Admit: 2019-10-13 | Discharge: 2019-11-19 | DRG: 264 | Disposition: A | Discharge status: Skilled Nursing Facility | Payer: Medicaid Other | Attending: Internal Medicine | Admitting: Internal Medicine

## 2019-10-13 ENCOUNTER — Encounter (HOSPITAL_COMMUNITY): Payer: Self-pay | Admitting: Emergency Medicine

## 2019-10-13 DIAGNOSIS — I509 Heart failure, unspecified: Secondary | ICD-10-CM

## 2019-10-13 DIAGNOSIS — R531 Weakness: Secondary | ICD-10-CM

## 2019-10-13 DIAGNOSIS — Z841 Family history of disorders of kidney and ureter: Secondary | ICD-10-CM

## 2019-10-13 DIAGNOSIS — N4889 Other specified disorders of penis: Secondary | ICD-10-CM | POA: Diagnosis present

## 2019-10-13 DIAGNOSIS — D631 Anemia in chronic kidney disease: Secondary | ICD-10-CM | POA: Diagnosis present

## 2019-10-13 DIAGNOSIS — Z590 Homelessness: Secondary | ICD-10-CM

## 2019-10-13 DIAGNOSIS — E785 Hyperlipidemia, unspecified: Secondary | ICD-10-CM | POA: Diagnosis present

## 2019-10-13 DIAGNOSIS — R338 Other retention of urine: Secondary | ICD-10-CM

## 2019-10-13 DIAGNOSIS — Z20822 Contact with and (suspected) exposure to covid-19: Secondary | ICD-10-CM | POA: Diagnosis present

## 2019-10-13 DIAGNOSIS — R310 Gross hematuria: Secondary | ICD-10-CM | POA: Diagnosis not present

## 2019-10-13 DIAGNOSIS — Z8249 Family history of ischemic heart disease and other diseases of the circulatory system: Secondary | ICD-10-CM

## 2019-10-13 DIAGNOSIS — R197 Diarrhea, unspecified: Secondary | ICD-10-CM

## 2019-10-13 DIAGNOSIS — J9621 Acute and chronic respiratory failure with hypoxia: Secondary | ICD-10-CM | POA: Diagnosis not present

## 2019-10-13 DIAGNOSIS — Z7189 Other specified counseling: Secondary | ICD-10-CM

## 2019-10-13 DIAGNOSIS — Z833 Family history of diabetes mellitus: Secondary | ICD-10-CM

## 2019-10-13 DIAGNOSIS — R34 Anuria and oliguria: Secondary | ICD-10-CM | POA: Diagnosis not present

## 2019-10-13 DIAGNOSIS — N368 Other specified disorders of urethra: Secondary | ICD-10-CM | POA: Diagnosis not present

## 2019-10-13 DIAGNOSIS — R198 Other specified symptoms and signs involving the digestive system and abdomen: Secondary | ICD-10-CM

## 2019-10-13 DIAGNOSIS — E1141 Type 2 diabetes mellitus with diabetic mononeuropathy: Secondary | ICD-10-CM | POA: Diagnosis present

## 2019-10-13 DIAGNOSIS — E1122 Type 2 diabetes mellitus with diabetic chronic kidney disease: Secondary | ICD-10-CM | POA: Diagnosis present

## 2019-10-13 DIAGNOSIS — I132 Hypertensive heart and chronic kidney disease with heart failure and with stage 5 chronic kidney disease, or end stage renal disease: Principal | ICD-10-CM | POA: Diagnosis present

## 2019-10-13 DIAGNOSIS — Z87891 Personal history of nicotine dependence: Secondary | ICD-10-CM

## 2019-10-13 DIAGNOSIS — N5089 Other specified disorders of the male genital organs: Secondary | ICD-10-CM | POA: Diagnosis present

## 2019-10-13 DIAGNOSIS — N184 Chronic kidney disease, stage 4 (severe): Secondary | ICD-10-CM

## 2019-10-13 DIAGNOSIS — E8770 Fluid overload, unspecified: Secondary | ICD-10-CM

## 2019-10-13 DIAGNOSIS — Z79899 Other long term (current) drug therapy: Secondary | ICD-10-CM

## 2019-10-13 DIAGNOSIS — D62 Acute posthemorrhagic anemia: Secondary | ICD-10-CM | POA: Diagnosis not present

## 2019-10-13 DIAGNOSIS — E871 Hypo-osmolality and hyponatremia: Secondary | ICD-10-CM | POA: Diagnosis not present

## 2019-10-13 DIAGNOSIS — E8729 Other acidosis: Secondary | ICD-10-CM

## 2019-10-13 DIAGNOSIS — G9341 Metabolic encephalopathy: Secondary | ICD-10-CM | POA: Diagnosis not present

## 2019-10-13 DIAGNOSIS — E876 Hypokalemia: Secondary | ICD-10-CM | POA: Diagnosis not present

## 2019-10-13 DIAGNOSIS — I10 Essential (primary) hypertension: Secondary | ICD-10-CM

## 2019-10-13 DIAGNOSIS — Y92239 Unspecified place in hospital as the place of occurrence of the external cause: Secondary | ICD-10-CM | POA: Diagnosis not present

## 2019-10-13 DIAGNOSIS — R339 Retention of urine, unspecified: Secondary | ICD-10-CM | POA: Diagnosis present

## 2019-10-13 DIAGNOSIS — E11319 Type 2 diabetes mellitus with unspecified diabetic retinopathy without macular edema: Secondary | ICD-10-CM | POA: Diagnosis present

## 2019-10-13 DIAGNOSIS — Z560 Unemployment, unspecified: Secondary | ICD-10-CM

## 2019-10-13 DIAGNOSIS — R0902 Hypoxemia: Secondary | ICD-10-CM

## 2019-10-13 DIAGNOSIS — Z992 Dependence on renal dialysis: Secondary | ICD-10-CM

## 2019-10-13 DIAGNOSIS — R680 Hypothermia, not associated with low environmental temperature: Secondary | ICD-10-CM | POA: Diagnosis not present

## 2019-10-13 DIAGNOSIS — L89152 Pressure ulcer of sacral region, stage 2: Secondary | ICD-10-CM | POA: Diagnosis not present

## 2019-10-13 DIAGNOSIS — E162 Hypoglycemia, unspecified: Secondary | ICD-10-CM

## 2019-10-13 DIAGNOSIS — N186 End stage renal disease: Secondary | ICD-10-CM

## 2019-10-13 DIAGNOSIS — L89892 Pressure ulcer of other site, stage 2: Secondary | ICD-10-CM | POA: Diagnosis not present

## 2019-10-13 DIAGNOSIS — E11649 Type 2 diabetes mellitus with hypoglycemia without coma: Secondary | ICD-10-CM

## 2019-10-13 DIAGNOSIS — Z515 Encounter for palliative care: Secondary | ICD-10-CM | POA: Diagnosis not present

## 2019-10-13 DIAGNOSIS — I5030 Unspecified diastolic (congestive) heart failure: Secondary | ICD-10-CM

## 2019-10-13 DIAGNOSIS — E872 Acidosis, unspecified: Secondary | ICD-10-CM

## 2019-10-13 DIAGNOSIS — I5033 Acute on chronic diastolic (congestive) heart failure: Secondary | ICD-10-CM | POA: Diagnosis present

## 2019-10-13 DIAGNOSIS — T8383XA Hemorrhage of genitourinary prosthetic devices, implants and grafts, initial encounter: Secondary | ICD-10-CM | POA: Diagnosis not present

## 2019-10-13 DIAGNOSIS — R443 Hallucinations, unspecified: Secondary | ICD-10-CM | POA: Diagnosis not present

## 2019-10-13 DIAGNOSIS — N179 Acute kidney failure, unspecified: Secondary | ICD-10-CM | POA: Diagnosis present

## 2019-10-13 DIAGNOSIS — E1165 Type 2 diabetes mellitus with hyperglycemia: Secondary | ICD-10-CM | POA: Diagnosis present

## 2019-10-13 DIAGNOSIS — L8990 Pressure ulcer of unspecified site, unspecified stage: Secondary | ICD-10-CM | POA: Insufficient documentation

## 2019-10-13 DIAGNOSIS — N2581 Secondary hyperparathyroidism of renal origin: Secondary | ICD-10-CM | POA: Diagnosis not present

## 2019-10-13 DIAGNOSIS — Z885 Allergy status to narcotic agent status: Secondary | ICD-10-CM

## 2019-10-13 DIAGNOSIS — Z7984 Long term (current) use of oral hypoglycemic drugs: Secondary | ICD-10-CM

## 2019-10-13 DIAGNOSIS — Z6830 Body mass index (BMI) 30.0-30.9, adult: Secondary | ICD-10-CM

## 2019-10-13 DIAGNOSIS — S3730XA Unspecified injury of urethra, initial encounter: Secondary | ICD-10-CM | POA: Diagnosis not present

## 2019-10-13 DIAGNOSIS — Y846 Urinary catheterization as the cause of abnormal reaction of the patient, or of later complication, without mention of misadventure at the time of the procedure: Secondary | ICD-10-CM | POA: Diagnosis not present

## 2019-10-13 DIAGNOSIS — R627 Adult failure to thrive: Secondary | ICD-10-CM | POA: Diagnosis present

## 2019-10-13 DIAGNOSIS — E1143 Type 2 diabetes mellitus with diabetic autonomic (poly)neuropathy: Secondary | ICD-10-CM | POA: Diagnosis present

## 2019-10-13 DIAGNOSIS — K3184 Gastroparesis: Secondary | ICD-10-CM | POA: Diagnosis present

## 2019-10-13 DIAGNOSIS — H548 Legal blindness, as defined in USA: Secondary | ICD-10-CM | POA: Diagnosis present

## 2019-10-13 HISTORY — DX: Other acidosis: E87.29

## 2019-10-13 HISTORY — DX: Other retention of urine: R33.8

## 2019-10-13 LAB — CBC
HCT: 23.7 % — ABNORMAL LOW (ref 39.0–52.0)
Hemoglobin: 7.2 g/dL — ABNORMAL LOW (ref 13.0–17.0)
MCH: 27.1 pg (ref 26.0–34.0)
MCHC: 30.4 g/dL (ref 30.0–36.0)
MCV: 89.1 fL (ref 80.0–100.0)
Platelets: 267 10*3/uL (ref 150–400)
RBC: 2.66 MIL/uL — ABNORMAL LOW (ref 4.22–5.81)
RDW: 13.8 % (ref 11.5–15.5)
WBC: 5.3 10*3/uL (ref 4.0–10.5)
nRBC: 0 % (ref 0.0–0.2)

## 2019-10-13 LAB — URINALYSIS, ROUTINE W REFLEX MICROSCOPIC
Bilirubin Urine: NEGATIVE
Glucose, UA: 50 mg/dL — AB
Ketones, ur: NEGATIVE mg/dL
Nitrite: NEGATIVE
Protein, ur: >=300 mg/dL — AB
RBC / HPF: >50 RBC/hpf — ABNORMAL HIGH (ref 0–5)
Specific Gravity, Urine: 1.015 (ref 1.005–1.030)
pH: 5 (ref 5.0–8.0)

## 2019-10-13 LAB — GASTROINTESTINAL PANEL BY PCR, STOOL (REPLACES STOOL CULTURE)

## 2019-10-13 LAB — COMPREHENSIVE METABOLIC PANEL
ALT: 67 U/L — ABNORMAL HIGH (ref 0–44)
AST: 43 U/L — ABNORMAL HIGH (ref 15–41)
Albumin: 2.5 g/dL — ABNORMAL LOW (ref 3.5–5.0)
Alkaline Phosphatase: 137 U/L — ABNORMAL HIGH (ref 38–126)
Anion gap: 20 — ABNORMAL HIGH (ref 5–15)
BUN: 63 mg/dL — ABNORMAL HIGH (ref 6–20)
CO2: 10 mmol/L — ABNORMAL LOW (ref 22–32)
Calcium: 8.8 mg/dL — ABNORMAL LOW (ref 8.9–10.3)
Chloride: 110 mmol/L (ref 98–111)
Creatinine, Ser: 4 mg/dL — ABNORMAL HIGH (ref 0.61–1.24)
GFR calc Af Amer: 19 mL/min — ABNORMAL LOW (ref >60)
GFR calc non Af Amer: 17 mL/min — ABNORMAL LOW (ref >60)
Glucose, Bld: 36 mg/dL — CL (ref 70–99)
Potassium: 4.5 mmol/L (ref 3.5–5.1)
Sodium: 140 mmol/L (ref 135–145)
Total Bilirubin: 0.4 mg/dL (ref 0.3–1.2)
Total Protein: 6.1 g/dL — ABNORMAL LOW (ref 6.5–8.1)

## 2019-10-13 LAB — BASIC METABOLIC PANEL
Anion gap: 10 (ref 5–15)
BUN: 60 mg/dL — ABNORMAL HIGH (ref 6–20)
CO2: 20 mmol/L — ABNORMAL LOW (ref 22–32)
Calcium: 8.6 mg/dL — ABNORMAL LOW (ref 8.9–10.3)
Chloride: 109 mmol/L (ref 98–111)
Creatinine, Ser: 3.86 mg/dL — ABNORMAL HIGH (ref 0.61–1.24)
GFR calc Af Amer: 20 mL/min — ABNORMAL LOW (ref >60)
GFR calc non Af Amer: 17 mL/min — ABNORMAL LOW (ref >60)
Glucose, Bld: 155 mg/dL — ABNORMAL HIGH (ref 70–99)
Potassium: 5.1 mmol/L (ref 3.5–5.1)
Sodium: 139 mmol/L (ref 135–145)

## 2019-10-13 LAB — POCT I-STAT EG7
Acid-base deficit: 7 mmol/L — ABNORMAL HIGH (ref 0.0–2.0)
Bicarbonate: 18.1 mmol/L — ABNORMAL LOW (ref 20.0–28.0)
Calcium, Ion: 1.17 mmol/L (ref 1.15–1.40)
HCT: 22 % — ABNORMAL LOW (ref 39.0–52.0)
Hemoglobin: 7.5 g/dL — ABNORMAL LOW (ref 13.0–17.0)
O2 Saturation: 94 %
Potassium: 5.1 mmol/L (ref 3.5–5.1)
Sodium: 139 mmol/L (ref 135–145)
TCO2: 19 mmol/L — ABNORMAL LOW (ref 22–32)
pCO2, Ven: 34.4 mmHg — ABNORMAL LOW (ref 44.0–60.0)
pH, Ven: 7.328 (ref 7.250–7.430)
pO2, Ven: 76 mmHg — ABNORMAL HIGH (ref 32.0–45.0)

## 2019-10-13 LAB — CBG MONITORING, ED
Glucose-Capillary: 24 mg/dL — CL (ref 70–99)
Glucose-Capillary: 147 mg/dL — ABNORMAL HIGH (ref 70–99)
Glucose-Capillary: 164 mg/dL — ABNORMAL HIGH (ref 70–99)
Glucose-Capillary: 114 mg/dL — ABNORMAL HIGH (ref 70–99)
Glucose-Capillary: 106 mg/dL — ABNORMAL HIGH (ref 70–99)

## 2019-10-13 LAB — RESPIRATORY PANEL BY RT PCR (FLU A&B, COVID)
Influenza A by PCR: NEGATIVE
Influenza B by PCR: NEGATIVE
SARS Coronavirus 2 by RT PCR: NEGATIVE

## 2019-10-13 LAB — PROTIME-INR
INR: 1.1 (ref 0.8–1.2)
Prothrombin Time: 13.7 seconds (ref 11.4–15.2)

## 2019-10-13 LAB — C DIFFICILE QUICK SCREEN W PCR REFLEX
C Diff antigen: NEGATIVE
C Diff interpretation: NOT DETECTED
C Diff toxin: NEGATIVE

## 2019-10-13 LAB — LIPASE, BLOOD: Lipase: 33 U/L (ref 11–51)

## 2019-10-13 LAB — LACTIC ACID, PLASMA: Lactic Acid, Venous: 1.5 mmol/L (ref 0.5–1.9)

## 2019-10-13 LAB — GLUCOSE, CAPILLARY
Glucose-Capillary: 67 mg/dL — ABNORMAL LOW (ref 70–99)
Glucose-Capillary: 56 mg/dL — ABNORMAL LOW (ref 70–99)
Glucose-Capillary: 156 mg/dL — ABNORMAL HIGH (ref 70–99)

## 2019-10-13 MED ORDER — SODIUM BICARBONATE 650 MG PO TABS
650.0000 mg | ORAL_TABLET | Freq: Three times a day (TID) | ORAL | Status: DC
  Administered 2019-10-13 – 2019-10-15 (×7): 650 mg via ORAL
  Filled 2019-10-13 (×9): qty 1

## 2019-10-13 MED ORDER — BRIMONIDINE TARTRATE 0.15 % OP SOLN
1.0000 [drp] | Freq: Two times a day (BID) | OPHTHALMIC | Status: DC
  Administered 2019-10-13 – 2019-11-19 (×73): 1 [drp] via OPHTHALMIC
  Filled 2019-10-13: qty 5

## 2019-10-13 MED ORDER — HEPARIN SODIUM (PORCINE) 5000 UNIT/ML IJ SOLN
5000.0000 [IU] | Freq: Three times a day (TID) | INTRAMUSCULAR | Status: AC
  Administered 2019-10-13 – 2019-10-27 (×44): 5000 [IU] via SUBCUTANEOUS
  Filled 2019-10-13 (×43): qty 1

## 2019-10-13 MED ORDER — LATANOPROST 0.005 % OP SOLN
1.0000 [drp] | Freq: Every day | OPHTHALMIC | Status: DC
  Administered 2019-10-13 – 2019-11-18 (×37): 1 [drp] via OPHTHALMIC
  Filled 2019-10-13 (×2): qty 2.5

## 2019-10-13 MED ORDER — ASPIRIN EC 81 MG PO TBEC
81.0000 mg | DELAYED_RELEASE_TABLET | Freq: Every day | ORAL | Status: DC
  Administered 2019-10-13 – 2019-11-05 (×22): 81 mg via ORAL
  Filled 2019-10-13 (×23): qty 1

## 2019-10-13 MED ORDER — DEXTROSE 50 % IV SOLN
1.0000 | Freq: Once | INTRAVENOUS | Status: AC
  Administered 2019-10-13: 50 mL via INTRAVENOUS

## 2019-10-13 MED ORDER — ACETAMINOPHEN 325 MG PO TABS
650.0000 mg | ORAL_TABLET | Freq: Four times a day (QID) | ORAL | Status: DC | PRN
  Administered 2019-10-14 – 2019-11-15 (×22): 650 mg via ORAL
  Filled 2019-10-13 (×28): qty 2

## 2019-10-13 MED ORDER — CHLORHEXIDINE GLUCONATE CLOTH 2 % EX PADS
6.0000 | MEDICATED_PAD | Freq: Every day | CUTANEOUS | Status: DC
  Administered 2019-10-13 – 2019-10-31 (×18): 6 via TOPICAL

## 2019-10-13 MED ORDER — SODIUM CHLORIDE 0.9% FLUSH
3.0000 mL | Freq: Two times a day (BID) | INTRAVENOUS | Status: DC
  Administered 2019-10-13: 12:00:00 3 mL via INTRAVENOUS

## 2019-10-13 MED ORDER — SODIUM CHLORIDE 0.9 % IV BOLUS: 1000.0000 mL | Freq: Once | INTRAVENOUS | Status: DC

## 2019-10-13 MED ORDER — SODIUM BICARBONATE 8.4 % IV SOLN
50.0000 meq | Freq: Once | INTRAVENOUS | Status: AC
  Administered 2019-10-13: 50 meq via INTRAVENOUS
  Filled 2019-10-13: qty 50

## 2019-10-13 MED ORDER — GABAPENTIN 300 MG PO CAPS
300.0000 mg | ORAL_CAPSULE | Freq: Three times a day (TID) | ORAL | Status: DC
  Administered 2019-10-13 – 2019-10-15 (×6): 300 mg via ORAL
  Filled 2019-10-13 (×4): qty 1
  Filled 2019-10-13: qty 3
  Filled 2019-10-13 (×2): qty 1

## 2019-10-13 MED ORDER — SODIUM BICARBONATE-DEXTROSE 150-5 MEQ/L-% IV SOLN
150.0000 meq | INTRAVENOUS | Status: DC
  Filled 2019-10-13: qty 1000

## 2019-10-13 MED ORDER — SODIUM BICARBONATE 8.4 % IV SOLN
INTRAVENOUS | Status: DC
  Filled 2019-10-13: qty 150

## 2019-10-13 MED ORDER — DEXTROSE 50 % IV SOLN
12.5000 g | INTRAVENOUS | Status: AC
  Administered 2019-10-13: 12.5 g via INTRAVENOUS

## 2019-10-13 MED ORDER — MAGNESIUM SULFATE 2 GM/50ML IV SOLN
2.0000 g | Freq: Once | INTRAVENOUS | Status: AC
  Administered 2019-10-13: 10:00:00 2 g via INTRAVENOUS
  Filled 2019-10-13: qty 50

## 2019-10-13 MED ORDER — ZOLPIDEM TARTRATE 5 MG PO TABS
5.0000 mg | ORAL_TABLET | Freq: Every evening | ORAL | Status: AC | PRN
  Administered 2019-10-13: 5 mg via ORAL
  Filled 2019-10-13: qty 1

## 2019-10-13 MED ORDER — LACTATED RINGERS IV BOLUS
2000.0000 mL | Freq: Once | INTRAVENOUS | Status: AC
  Administered 2019-10-13: 2000 mL via INTRAVENOUS

## 2019-10-13 MED ORDER — DORZOLAMIDE HCL-TIMOLOL MAL 2-0.5 % OP SOLN
1.0000 [drp] | Freq: Two times a day (BID) | OPHTHALMIC | Status: DC
  Administered 2019-10-13 – 2019-11-19 (×72): 1 [drp] via OPHTHALMIC
  Filled 2019-10-13: qty 10

## 2019-10-13 MED ORDER — INSULIN ASPART 100 UNIT/ML ~~LOC~~ SOLN
0.0000 [IU] | Freq: Three times a day (TID) | SUBCUTANEOUS | Status: DC
  Administered 2019-10-14 – 2019-10-15 (×3): 1 [IU] via SUBCUTANEOUS
  Administered 2019-10-16 (×2): 2 [IU] via SUBCUTANEOUS
  Administered 2019-10-17: 1 [IU] via SUBCUTANEOUS
  Administered 2019-10-17: 3 [IU] via SUBCUTANEOUS
  Administered 2019-10-17 – 2019-10-18 (×2): 2 [IU] via SUBCUTANEOUS
  Administered 2019-10-18: 3 [IU] via SUBCUTANEOUS
  Administered 2019-10-18: 1 [IU] via SUBCUTANEOUS
  Administered 2019-10-19 (×2): 3 [IU] via SUBCUTANEOUS
  Administered 2019-10-19 – 2019-10-20 (×2): 1 [IU] via SUBCUTANEOUS
  Administered 2019-10-20 – 2019-10-21 (×2): 2 [IU] via SUBCUTANEOUS
  Administered 2019-10-21 (×2): 1 [IU] via SUBCUTANEOUS
  Administered 2019-10-22 (×3): 2 [IU] via SUBCUTANEOUS
  Administered 2019-10-23: 17:00:00 1 [IU] via SUBCUTANEOUS
  Administered 2019-10-23: 12:00:00 2 [IU] via SUBCUTANEOUS
  Administered 2019-10-23 – 2019-10-24 (×2): 1 [IU] via SUBCUTANEOUS
  Administered 2019-10-24: 2 [IU] via SUBCUTANEOUS
  Administered 2019-10-24 – 2019-10-25 (×2): 1 [IU] via SUBCUTANEOUS
  Administered 2019-10-25 – 2019-10-27 (×7): 2 [IU] via SUBCUTANEOUS
  Administered 2019-10-29 – 2019-10-30 (×3): 1 [IU] via SUBCUTANEOUS
  Administered 2019-10-31 (×2): 2 [IU] via SUBCUTANEOUS
  Administered 2019-11-01: 3 [IU] via SUBCUTANEOUS
  Administered 2019-11-01 (×2): 1 [IU] via SUBCUTANEOUS
  Administered 2019-11-02: 3 [IU] via SUBCUTANEOUS
  Administered 2019-11-02: 2 [IU] via SUBCUTANEOUS
  Administered 2019-11-03: 1 [IU] via SUBCUTANEOUS
  Administered 2019-11-03: 2 [IU] via SUBCUTANEOUS
  Administered 2019-11-04 – 2019-11-07 (×5): 1 [IU] via SUBCUTANEOUS
  Administered 2019-11-08: 2 [IU] via SUBCUTANEOUS
  Administered 2019-11-09: 1 [IU] via SUBCUTANEOUS
  Administered 2019-11-09 – 2019-11-10 (×2): 2 [IU] via SUBCUTANEOUS
  Administered 2019-11-10 – 2019-11-11 (×3): 1 [IU] via SUBCUTANEOUS
  Administered 2019-11-12: 2 [IU] via SUBCUTANEOUS
  Administered 2019-11-12: 1 [IU] via SUBCUTANEOUS
  Administered 2019-11-12: 3 [IU] via SUBCUTANEOUS

## 2019-10-13 MED ORDER — LACTATED RINGERS IV SOLN
INTRAVENOUS | Status: DC
  Administered 2019-10-13: 1000 mL via INTRAVENOUS

## 2019-10-13 MED ORDER — DEXTROSE 50 % IV SOLN
INTRAVENOUS | Status: AC
  Filled 2019-10-13: qty 50

## 2019-10-13 MED ORDER — HYDRALAZINE HCL 25 MG PO TABS
12.5000 mg | ORAL_TABLET | Freq: Three times a day (TID) | ORAL | Status: DC
  Administered 2019-10-13 – 2019-10-18 (×15): 12.5 mg via ORAL
  Filled 2019-10-13 (×15): qty 1

## 2019-10-13 MED ORDER — GATIFLOXACIN 0.5 % OP SOLN
1.0000 [drp] | Freq: Four times a day (QID) | OPHTHALMIC | Status: DC
  Administered 2019-10-13 – 2019-11-19 (×131): 1 [drp] via OPHTHALMIC
  Filled 2019-10-13 (×3): qty 2.5

## 2019-10-13 MED ORDER — SODIUM BICARBONATE 8.4 % IV SOLN: INTRAVENOUS | Status: DC

## 2019-10-13 MED ORDER — ACETAMINOPHEN 650 MG RE SUPP
650.0000 mg | Freq: Four times a day (QID) | RECTAL | Status: DC | PRN
  Filled 2019-10-13: qty 1

## 2019-10-13 NOTE — ED Notes (Signed)
Pt more alert at this time, pt assisted to drink juice and eat sandwich.

## 2019-10-13 NOTE — ED Notes (Signed)
Critical glucose called to this RN by [employer]. CBG rechecked in triage and found to be 24. Pt too lethargic for PO intake. Dr. Betsey Holiday made aware of CBG results and verbal order of 1 amp D50 given to this RN.

## 2019-10-13 NOTE — ED Triage Notes (Signed)
Pt arrives via [facility] from home with c/o from family stating patient has had diarrhea all day and weakness. Pt very weak upon EMS arrival, pt not able to stand or bear weight on his own. Worsening edema to abdomen and lower extremity edema. Family reports pt stopped his lasix. Pt seen here for similar on 4/20. Pt alert and able to answer questions but appears very lethargic.

## 2019-10-13 NOTE — H&P (Signed)
History and Physical    Christopher Davila XLK:[id number] DOB: Apr 11, 1971 DOA: 10/13/2019  PCP: Marliss Coots, NP Consultants:  None Patient coming from:  Home - lives with cousin; NOK: Christopher, Davila, [phone]  Chief Complaint: Diarrhea  HPI: Christopher Davila is a 49 y.o. male with medical history significant of HTN; DM with gastroparesis and blindness; stage 4 CKD; and chronic diastolic CHF presenting with diarrhea and weakness.  He was last admitted from 4/10-12 for acute on chronic diastolic CHF.  He returned to the ER on 4/20 with failure to thrive and weakness, but did not qualify for hospitalization at that time and is not able to be placed in SNF.  He reports that he has been having diarrhea since yesterday.  He wonders if he ate something that upset his stomach.  No nausea or vomiting.   No recent antibiotics other than eye drops.  He has been having some problems with weakness in his legs and arms for a few days and it also happened last week.  PT came to see him at home last week.    ED Course:  Severe diarrhea x 24 hours.  Severe acidosis with gap. Giving Mag, bicarb, LR due to QTc 500.     Review of Systems: As per HPI; otherwise review of systems reviewed and negative.   Ambulatory Status:  Ambulates with a walker  COVID Vaccine Status:  None\  Past Medical History:  Diagnosis Date  . Abdominal pain 12/28/2012  . Acute kidney injury superimposed on chronic kidney disease ([place])   . Blindness 09/04/2019  . Chest pain 02/14/2014  . Diabetes mellitus   . Dyspnea   . Epigastric abdominal pain   . Gastroparesis due to DM ([place])   . Hypertension   . Hypoalbuminemia 04/17/2019  . Hypoxia   . Nausea   . Neuropathy of lower extremity   . Oral candidiasis 06/26/2012  . Pancreatitis 06/26/2012  . Weight loss 06/26/2012  . Wheezing     History reviewed. No pertinent surgical history.  Social History   Socioeconomic History  . Marital status: Single    Spouse name:  Not on file  . Number of children: Not on file  . Years of education: Not on file  . Highest education level: Not on file  Occupational History  . Occupation: dish washer, unemployed  Tobacco Use  . Smoking status: Former Smoker    Packs/day: 0.00    Types: Cigarettes    Quit date: 03/22/2015    Years since quitting: 4.5  . Smokeless tobacco: Never Used  Substance and Sexual Activity  . Alcohol use: Not Currently  . Drug use: No  . Sexual activity: Not on file  Other Topics Concern  . Not on file  Social History Narrative  . Not on file   Social Determinants of Health   Financial Resource Strain:   . Difficulty of Paying Living Expenses:   Food Insecurity:   . Worried About Charity fundraiser in the Last Year:   . Arboriculturist in the Last Year:   Transportation Needs:   . Film/video editor (Medical):   Marland Kitchen Lack of Transportation (Non-Medical):   Physical Activity:   . Days of Exercise per Week:   . Minutes of Exercise per Session:   Stress:   . Feeling of Stress :   Social Connections:   . Frequency of Communication with Friends and Family:   . Frequency of Social Gatherings with Friends  and Family:   . Attends Religious Services:   . Active Member of Clubs or Organizations:   . Attends Archivist Meetings:   Marland Kitchen Marital Status:   Intimate Partner Violence:   . Fear of Current or Ex-Partner:   . Emotionally Abused:   Marland Kitchen Physically Abused:   . Sexually Abused:     Allergies  Allergen Reactions  . Morphine And Related Itching and Other (See Comments)    Pt prefers not to be given this drug    Family History  Problem Relation Age of Onset  . Diabetes Mother   . Lung disease Mother   . Hypertension Mother   . Diabetes Maternal Aunt   . CAD Maternal Aunt   . CAD Cousin     Prior to Admission medications   Medication Sig Start Date End Date Taking? Authorizing Provider  ALPHAGAN P 0.15 % ophthalmic solution Place 1 drop into the left eye 2  (two) times daily. 09/30/19   [provider]  aspirin EC 81 MG tablet Take 1 tablet (81 mg total) by mouth daily. 05/24/15   Ghimire, Henreitta Leber, MD  brimonidine (ALPHAGAN) 0.2 % ophthalmic solution Place 1 drop into the left eye 2 (two) times daily. Patient not taking: Reported on 10/05/2019 09/23/19   Geradine Girt, DO  carvedilol (COREG) 25 MG tablet Take 1 tablet (25 mg total) by mouth 2 (two) times daily with a meal. 02/19/19   Gladys Damme, MD  dorzolamide-timolol (COSOPT) 22.3-6.8 MG/ML ophthalmic solution Place 1 drop into the left eye 2 (two) times daily. 09/23/19   Geradine Girt, DO  furosemide (LASIX) 40 MG tablet Take 1 tablet (40 mg total) by mouth daily. 09/27/19 12/26/19  Dessa Phi, DO  gabapentin (NEURONTIN) 300 MG capsule Take 1 capsule (300 mg total) by mouth 2 (two) times daily. Patient taking differently: Take 300 mg by mouth 3 (three) times daily.  09/23/19   Geradine Girt, DO  glipiZIDE (GLUCOTROL) 10 MG tablet Take 1 tablet (10 mg total) by mouth 2 (two) times daily. 09/23/19   Geradine Girt, DO  hydrALAZINE (APRESOLINE) 25 MG tablet Take 0.5 tablets (12.5 mg total) by mouth 3 (three) times daily. 09/23/19   Geradine Girt, DO  latanoprost (XALATAN) 0.005 % ophthalmic solution Place 1 drop into the left eye at bedtime. Patient not taking: Reported on 10/05/2019 09/23/19   Eulogio Bear U, DO  LUMIGAN 0.01 % SOLN Place 1 drop into the left eye at bedtime. 09/30/19   [provider]  moxifloxacin (VIGAMOX) 0.5 % ophthalmic solution Place 1 drop into the left eye 4 (four) times daily. 09/24/19   [provider]  sodium bicarbonate 650 MG tablet Take 1 tablet (650 mg total) by mouth 3 (three) times daily. 09/23/19   Geradine Girt, DO    Physical Exam: Vitals:   10/13/19 0745 10/13/19 0900 10/13/19 1016 10/13/19 1016  BP: 115/80 120/82 131/83   Pulse: (!) 52 (!) 55 (!) 55   Resp:      Temp:      SpO2: 100% 98% 98%   Weight:    64.4 kg  Height:    5'  3" (1.6 m)     . General:  Appears calm and comfortable and is NAD . Eyes: Marked left > right conjunctival injection, normal lids, iris . ENT:  grossly normal hearing, lips & tongue; poor dentition . Neck:  no LAD, masses or thyromegaly; no carotid bruits .  Cardiovascular:  RR with mild bradycardia, no m/r/g. 1-2+ LE edema.  Marland Kitchen Respiratory:   CTA bilaterally with no wheezes/rales/rhonchi.  Normal respiratory effort. . Abdomen:  soft, LLQ TTP, ND, hyperactive BS . Back:   normal alignment, no CVAT . Skin:  no rash or induration seen on limited exam . Musculoskeletal:  grossly normal tone BUE/BLE, good ROM, no bony abnormality . Psychiatric:  flat mood and affect, speech fluent and appropriate, AOx3 . Neurologic:  CN 2-12 grossly intact, moves all extremities in coordinated fashion    Radiological Exams on Admission: No results found.  EKG: Independently reviewed.  NSR with rate 53; QTc 500; ICVD with no evidence of acute ischemia   Labs on Admission: I have personally reviewed the available labs and imaging studies at the time of the admission.  Pertinent labs:   CO2 10; 20 on 4/20; back to 20 at 0957 Glucose 36, 24, 147, 164, 155 BUN 63/Creatinine 4.00/GFR 19 - stable Anion gap 20 AST 43/ALT 67; 38/51 on 4/20 WBC 5.3 Hgb 7.2; 7.5 on 4/11 Lactate 1.5 ABG: 7.328/34.4/76.0/18.1 INR 1.1 GI pathogen panel pending C diff negative  Assessment/Plan Principal Problem:   Diarrhea Active Problems:   Type 2 diabetes mellitus with hypoglycemia (HCC)   Essential hypertension   Hyperlipidemia   CKD (chronic kidney disease) stage 4, GFR 15-29 ml/min (HCC)   Weakness   Metabolic acidosis, increased anion gap   Acute urinary retention   Diarrhea -Patient with acute onset of copious diarrhea starting yesterday -He did receive recent antibiotics for E. Faecalis UTI on 4/20; however C diff is negative -Patient is NPO for now -IVF hydration -Stool panel is pending -Supportive  care only for now  Anion gap metabolic acidosis -marked acidosis on presentation but this is already improved with bicarb -Resume home PO TID bicarb dosing -Continue IVF hydration for now but bicarb drip does not appear to be needed at this time  Stage 4 CKD -Appears to be stable at this time -This is likely the impetus for his acidosis -Hydration, follow  HTN -Hold Coreg due to bradycardia -Continue Hydralazine  DM with hypoglycemia -Hold Glucotrol and consider discontinuation -Will cover with sensitive-scale SSI  HLD -He does not appear to be taking medication for this issue at this time  Weakness -Patient is living with a cousin -Placement would be desired due to his severe generalized weakness, but he is not a candidate for Medicaid currently and doesn't have insurance or a job and so placement is exceedingly unlikely -He will likely need to return home with ongoing [facility] services at the time of d/c -Will request PT/OT evaluation -Will request TOC team evaluation - she works and is unable to care for him at home  Urinary retention -Continue indwelling foley for now -Outpatient urology f/u  Blindness -For diabetic retinopathy treatment -Continue Alphagan, Xalatan, Vigamox, Cosopt  Borderline prolonged QT -Likely associated with acidosis -Will monitor overnight on telemetry     Note: This patient has been tested and is pending for the novel coronavirus COVID-19.  DVT prophylaxis: Heparin Code Status:  Full - confirmed with patient Family Communication: None present; I spoke with the patient's cousin by telephone at the time of admission. Disposition Plan:  The patient is from: home  Anticipated d/c is to: home with [facility] services unless he is thought to qualify for Medicaid and so be able to be placed  Anticipated d/c date will depend on clinical response to treatment, but possibly as early as tomorrow if  he has excellent response to treatment  Patient is currently:  acutely ill Consults called: PT/OT/TOC team Admission status:  It is my clinical opinion that referral for OBSERVATION is reasonable and necessary in this patient based on the above information provided. The aforementioned taken together are felt to place the patient at high risk for further clinical deterioration. However it is anticipated that the patient may be medically stable for discharge from the hospital within 24 to 48 hours.    Karmen Bongo MD [facility]   How to contact the [facility] Attending or Consulting provider [place] or covering provider during after hours [place], for this patient?  1. Check the care team in [facility] and look for a) attending/consulting TRH provider listed and b) the [facility] team listed 2. Log into [email or web address] and use [place]'s universal password to access. If you do not have the password, please contact the hospital operator. 3. Locate the [facility] provider you are looking for under [facility] and page to a number that you can be directly reached. 4. If you still have difficulty reaching the provider, please page the [facility] (Director on Call) for the Hospitalists listed on amion for assistance.   10/13/2019, 11:20 AM

## 2019-10-13 NOTE — ED Notes (Signed)
Cleaned patient up and changed bed patient is resting with call bell in reach

## 2019-10-13 NOTE — ED Provider Notes (Signed)
[facility] Emergency Department Provider Note MRN:  [id number]  Arrival date & time: 10/13/19     Chief Complaint   Diarrhea and Weakness   History of Present Illness   Rhonda Vangieson is a 49 y.o. year-old male with a history of gastroparesis, diabetes presenting to the ED with chief complaint of diarrhea and weakness.  24 hours of profuse watery diarrhea, no blood.  Feels very weak, general malaise.  Denies fever, no chest pain or shortness of breath, no abdominal pain.  Hypoglycemic in triage.  No recent travel, no recent antibiotics.  Review of Systems  A complete 10 system review of systems was obtained and all systems are negative except as noted in the HPI and PMH.   Patient's Health History    Past Medical History:  Diagnosis Date  . Abdominal pain 12/28/2012  . Acute kidney injury superimposed on chronic kidney disease ([place])   . Blindness 09/04/2019  . Chest pain 02/14/2014  . Diabetes mellitus   . Dyspnea   . Epigastric abdominal pain   . Gastroparesis due to DM ([place])   . Hypertension   . Hypoalbuminemia 04/17/2019  . Hypoxia   . Nausea   . Neuropathy of lower extremity   . Oral candidiasis 06/26/2012  . Pancreatitis 06/26/2012  . Weight loss 06/26/2012  . Wheezing     History reviewed. No pertinent surgical history.  Family History  Problem Relation Age of Onset  . Diabetes Mother   . Lung disease Mother   . Hypertension Mother   . Diabetes Maternal Aunt   . CAD Maternal Aunt   . CAD Cousin     Social History   Socioeconomic History  . Marital status: Single    Spouse name: Not on file  . Number of children: Not on file  . Years of education: Not on file  . Highest education level: Not on file  Occupational History  . Not on file  Tobacco Use  . Smoking status: Former Smoker    Packs/day: 0.00    Types: Cigarettes    Quit date: 03/22/2015    Years since quitting: 4.5  . Smokeless tobacco: Never Used  Substance and Sexual  Activity  . Alcohol use: Not Currently  . Drug use: No  . Sexual activity: Not on file  Other Topics Concern  . Not on file  Social History Narrative  . Not on file   Social Determinants of Health   Financial Resource Strain:   . Difficulty of Paying Living Expenses:   Food Insecurity:   . Worried About Charity fundraiser in the Last Year:   . Arboriculturist in the Last Year:   Transportation Needs:   . Film/video editor (Medical):   Marland Kitchen Lack of Transportation (Non-Medical):   Physical Activity:   . Days of Exercise per Week:   . Minutes of Exercise per Session:   Stress:   . Feeling of Stress :   Social Connections:   . Frequency of Communication with Friends and Family:   . Frequency of Social Gatherings with Friends and Family:   . Attends Religious Services:   . Active Member of Clubs or Organizations:   . Attends Archivist Meetings:   Marland Kitchen Marital Status:   Intimate Partner Violence:   . Fear of Current or Ex-Partner:   . Emotionally Abused:   Marland Kitchen Physically Abused:   . Sexually Abused:      Physical Exam  Vitals:   10/13/19 0745 10/13/19 0900  BP: 115/80 120/82  Pulse: (!) 52 (!) 55  Resp:    Temp:    SpO2: 100% 98%    CONSTITUTIONAL: Well-appearing, NAD, wearing a diaper, lower half of his body covered in liquid stool NEURO:  Alert and oriented x 3, no focal deficits EYES:  eyes equal and reactive ENT/NECK:  no LAD, no JVD CARDIO: Regular rate, well-perfused, normal S1 and S2 PULM:  CTAB no wheezing or rhonchi GI/GU:  normal bowel sounds, non-distended, non-tender MSK/SPINE:  No gross deformities, no edema SKIN:  no rash, atraumatic PSYCH:  Appropriate speech and behavior  *Additional and/or pertinent findings included in MDM below  Diagnostic and Interventional Summary    EKG Interpretation  Date/Time:  Wednesday October 13 2019 08:48:48 EDT Ventricular Rate:  53 PR Interval:    QRS Duration: 134 QT Interval:  532 QTC  Calculation: 500 R Axis:   62 Text Interpretation: Sinus rhythm Nonspecific intraventricular conduction delay Confirmed by Gerlene Fee [phone]) on 10/13/2019 10:15:23 AM      Labs Reviewed  COMPREHENSIVE METABOLIC PANEL - Abnormal; Notable for the following components:      Result Value   CO2 10 (*)    Glucose, Bld 36 (*)    BUN 63 (*)    Creatinine, Ser 4.00 (*)    Calcium 8.8 (*)    Total Protein 6.1 (*)    Albumin 2.5 (*)    AST 43 (*)    ALT 67 (*)    Alkaline Phosphatase 137 (*)    GFR calc non Af Amer 17 (*)    GFR calc Af Amer 19 (*)    Anion gap 20 (*)    All other components within normal limits  CBC - Abnormal; Notable for the following components:   RBC 2.66 (*)    Hemoglobin 7.2 (*)    HCT 23.7 (*)    All other components within normal limits  CBG MONITORING, ED - Abnormal; Notable for the following components:   Glucose-Capillary 24 (*)    All other components within normal limits  CBG MONITORING, ED - Abnormal; Notable for the following components:   Glucose-Capillary 147 (*)    All other components within normal limits  CBG MONITORING, ED - Abnormal; Notable for the following components:   Glucose-Capillary 164 (*)    All other components within normal limits  POCT I-STAT EG7 - Abnormal; Notable for the following components:   pCO2, Ven 34.4 (*)    pO2, Ven 76.0 (*)    Bicarbonate 18.1 (*)    TCO2 19 (*)    Acid-base deficit 7.0 (*)    HCT 22.0 (*)    Hemoglobin 7.5 (*)    All other components within normal limits  C DIFFICILE QUICK SCREEN W PCR REFLEX  GASTROINTESTINAL PANEL BY PCR, STOOL (REPLACES STOOL CULTURE)  RESPIRATORY PANEL BY RT PCR (FLU A&B, COVID)  LIPASE, BLOOD  LACTIC ACID, PLASMA  PROTIME-INR  URINALYSIS, ROUTINE W REFLEX MICROSCOPIC  BASIC METABOLIC PANEL    No orders to display    Medications  dextrose 50 % solution (has no administration in time range)  magnesium sulfate IVPB 2 g 50 mL (has no administration in time range)    dextrose 50 % solution 50 mL (50 mLs Intravenous Given 10/13/19 0620)  sodium bicarbonate injection 50 mEq (50 mEq Intravenous Given 10/13/19 0934)  lactated ringers bolus 2,000 mL (2,000 mLs Intravenous New Bag/Given 10/13/19 0800)  Procedures  /  Critical Care .Critical Care Performed by: Maudie Flakes, MD Authorized by: Maudie Flakes, MD   Critical care provider statement:    Critical care time (minutes):  32   Critical care was necessary to treat or prevent imminent or life-threatening deterioration of the following conditions:  Metabolic crisis (Profound hypoglycemia)   Critical care was time spent personally by me on the following activities:  Discussions with consultants, evaluation of patient's response to treatment, examination of patient, ordering and performing treatments and interventions, ordering and review of laboratory studies, ordering and review of radiographic studies, pulse oximetry, re-evaluation of patient's condition, obtaining history from patient or surrogate and review of old charts    ED Course and Medical Decision Making  I have reviewed the triage vital signs, the nursing notes, and pertinent available records from the EMR.  Listed above are laboratory and imaging tests that I personally ordered, reviewed, and interpreted and then considered in my medical decision making (see below for details).      Diarrheal illness, suspect infectious that seems to have caused significant metabolic disturbance with acidosis.  Patient denies fever, no abdominal pain, abdomen is nontender, will provide bicarb, fluids, dissipate admission.  Was also hypoglycemic requiring amp of D50.  EKG showing elongation of the QT interval compared to prior, giving magnesium, will recheck BMP to ensure improvement with interventions.  Admitted to hospital service for further care.  Barth Kirks. Sedonia Small, [place] [email or web address]  Final Clinical Impressions(s) / ED Diagnoses     ICD-10-CM   1. Diarrhea of presumed infectious origin  R19.7   2. Acidosis  E87.2   3. Hypoglycemia  E16.2   4. Weakness  R53.1     ED Discharge Orders    None       Discharge Instructions Discussed with and Provided to Patient:   Discharge Instructions   None       Maudie Flakes, MD 10/13/19 1016

## 2019-10-13 NOTE — Progress Notes (Signed)
Hypoglycemic Event  CBG: 67  Treatment: 8OZ Orange juice  Symptoms: None  Follow-up CBG: Time: 2256 CBG Result: 56  Possible Reasons for Event: NPO  Comments/MD notified: Followed Protocol    Christopher Davila

## 2019-10-13 NOTE — Progress Notes (Signed)
Hypoglycemic Event  CBG: 56   Treatment:  Amp D50   Symptoms: None  Follow-up CBG: Time:2254 CBG Result: 156  Possible Reasons for Event: NPO  Comments/MD notified:Followed protocol    Danae Chen A Daryl Quiros

## 2019-10-13 NOTE — ED Notes (Signed)
Patient cleaned up of soiled clothing and placed on stretcher in triage.

## 2019-10-14 ENCOUNTER — Encounter (HOSPITAL_COMMUNITY): Payer: Self-pay | Admitting: Internal Medicine

## 2019-10-14 ENCOUNTER — Observation Stay (HOSPITAL_COMMUNITY): Payer: Medicaid Other

## 2019-10-14 DIAGNOSIS — R197 Diarrhea, unspecified: Secondary | ICD-10-CM

## 2019-10-14 DIAGNOSIS — E785 Hyperlipidemia, unspecified: Secondary | ICD-10-CM

## 2019-10-14 HISTORY — DX: Diarrhea, unspecified: R19.7

## 2019-10-14 LAB — TYPE AND SCREEN
ABO/RH(D): O POS
Antibody Screen: NEGATIVE

## 2019-10-14 LAB — BASIC METABOLIC PANEL
Anion gap: 8 (ref 5–15)
BUN: 58 mg/dL — ABNORMAL HIGH (ref 6–20)
CO2: 21 mmol/L — ABNORMAL LOW (ref 22–32)
Calcium: 8.6 mg/dL — ABNORMAL LOW (ref 8.9–10.3)
Chloride: 110 mmol/L (ref 98–111)
Creatinine, Ser: 3.64 mg/dL — ABNORMAL HIGH (ref 0.61–1.24)
GFR calc Af Amer: 22 mL/min — ABNORMAL LOW (ref >60)
GFR calc non Af Amer: 19 mL/min — ABNORMAL LOW (ref >60)
Glucose, Bld: 91 mg/dL (ref 70–99)
Potassium: 4.8 mmol/L (ref 3.5–5.1)
Sodium: 139 mmol/L (ref 135–145)

## 2019-10-14 LAB — CORTISOL: Cortisol, Plasma: 18.1 ug/dL

## 2019-10-14 LAB — GLUCOSE, CAPILLARY
Glucose-Capillary: 83 mg/dL (ref 70–99)
Glucose-Capillary: 72 mg/dL (ref 70–99)
Glucose-Capillary: 92 mg/dL (ref 70–99)
Glucose-Capillary: 144 mg/dL — ABNORMAL HIGH (ref 70–99)
Glucose-Capillary: 111 mg/dL — ABNORMAL HIGH (ref 70–99)

## 2019-10-14 LAB — CBC
HCT: 25.5 % — ABNORMAL LOW (ref 39.0–52.0)
Hemoglobin: 7.8 g/dL — ABNORMAL LOW (ref 13.0–17.0)
MCH: 27.6 pg (ref 26.0–34.0)
MCHC: 30.6 g/dL (ref 30.0–36.0)
MCV: 90.1 fL (ref 80.0–100.0)
Platelets: 276 10*3/uL (ref 150–400)
RBC: 2.83 MIL/uL — ABNORMAL LOW (ref 4.22–5.81)
RDW: 13.9 % (ref 11.5–15.5)
WBC: 5.4 10*3/uL (ref 4.0–10.5)
nRBC: 0 % (ref 0.0–0.2)

## 2019-10-14 LAB — ABO/RH: ABO/RH(D): O POS

## 2019-10-14 LAB — TSH: TSH: 2.434 u[IU]/mL (ref 0.350–4.500)

## 2019-10-14 MED ORDER — HYDROCODONE-ACETAMINOPHEN 5-325 MG PO TABS
1.0000 | ORAL_TABLET | Freq: Four times a day (QID) | ORAL | Status: DC | PRN
  Administered 2019-10-14 – 2019-10-17 (×4): 1 via ORAL
  Administered 2019-10-18 – 2019-10-21 (×4): 2 via ORAL
  Administered 2019-10-22: 19:00:00 1 via ORAL
  Administered 2019-10-23 – 2019-10-25 (×3): 2 via ORAL
  Filled 2019-10-14 (×3): qty 1
  Filled 2019-10-14 (×4): qty 2
  Filled 2019-10-14 (×2): qty 1
  Filled 2019-10-14 (×3): qty 2
  Filled 2019-10-14: qty 1

## 2019-10-14 MED ORDER — FUROSEMIDE 10 MG/ML IJ SOLN: 40.0000 mg | Freq: Every day | INTRAMUSCULAR | Status: DC

## 2019-10-14 MED ORDER — FUROSEMIDE 10 MG/ML IJ SOLN
20.0000 mg | Freq: Every day | INTRAMUSCULAR | Status: DC
  Administered 2019-10-14: 20 mg via INTRAVENOUS
  Filled 2019-10-14: qty 2

## 2019-10-14 NOTE — Progress Notes (Signed)
   10/14/19 0516  Vitals  Temp 97.8 F (36.6 C)  Temp Source Oral  MEWS Score  MEWS Temp 0  MEWS Systolic 0  MEWS Pulse 0  MEWS RR 1  MEWS LOC 0  MEWS Score 1  MEWS Score Color Green  Patient resting in bed. Bair huggar still in place. Will continue to monitor.

## 2019-10-14 NOTE — Progress Notes (Signed)
Inpatient Rehabilitation Admissions Coordinator  Rehab Admissions Coordinator Note:  Patient was screened by Cleatrice Burke for appropriateness for an Inpatient Acute Rehab Consult per PT recs. Per review of patient's previous admissions over the last 6 months, Long term housing and care are a continued issue with his functional decline. He will need long term housing options which an inpt rehab admission will not resolve . He lacks the medical neccesity for an inpt rehab admit. Please call me with any questions. I will contact TOC team to further discuss, Tomi Bamberger, RN CM.  Cleatrice Burke RN MSN 10/14/2019, 1:54 PM  I can be reached at [phone].

## 2019-10-14 NOTE — Progress Notes (Signed)
PROGRESS NOTE  Ramzi Brathwaite MWN:[id number] DOB: 05-19-1971 DOA: 10/13/2019 PCP: Marliss Coots, NP  HPI/Recap of past 24 hours: HPI from Dr Lorin Mercy Adriann Ballweg is a 49 y.o. male with medical history significant of HTN; DM with gastroparesis and blindness; stage 4 CKD; and chronic diastolic CHF presenting with diarrhea and weakness.  He was last admitted from 4/10-12 for acute on chronic diastolic CHF.  He returned to the ER on 4/20 with failure to thrive and weakness, but did not qualify for hospitalization at that time and is not able to be placed in SNF.  He reports that he has been having diarrhea 1 day PTA. He wonders if he ate something that upset his stomach. No nausea or vomiting. No recent antibiotics other than eye drops.  He has been having some problems with weakness in his legs and arms for a few days and it also happened last week. PT came to see him at home last week. In the ED, severe acidosis with gap. Giving Mag, bicarb, LR.  Patient admitted for further management    Today, patient denies any new complaints, diarrhea has since slowed down/almost resolved, denies any abdominal pain, nausea/vomiting, fever/chills, chest pain, shortness of breath.  Noted significant bilateral lower extremity edema with some scrotal swelling    Assessment/Plan: Principal Problem:   Diarrhea Active Problems:   Type 2 diabetes mellitus with hypoglycemia (HCC)   Essential hypertension   Hyperlipidemia   CKD (chronic kidney disease) stage 4, GFR 15-29 ml/min (HCC)   Weakness   Metabolic acidosis, increased anion gap   Acute urinary retention  Diarrhea Currently improved C. difficile negative, stool panel negative DC IV hydration for now Supportive care  CKD stage IV/anion gap metabolic acidosis Significant bilateral lower extremity edema/scrotal swelling Creatinine around baseline Improved acidosis D/C IVF Continue home bicarb 3 times daily Start lasix 20 mg IV Daily  BMP  Hypertension BP somewhat soft Continue to hold Coreg, continue hydralazine  Diabetes mellitus type 2 Episode of hypoglycemia on 10/13/2019 SSI, Accu-Cheks, hypoglycemic protocol Consider discontinuation of Glucotrol  Generalized weakness/failure to thrive Patient lives with a cousin, who walks and unable to care for him at home PT/OT, recommend CIR versus SNF TOC consulted  Urinary retention Continue indwelling Foley catheter Outpatient follow-up  Blindness Continue eyedrops        Malnutrition Type:      Malnutrition Characteristics:      Nutrition Interventions:       Estimated body mass index is 30.29 kg/m as calculated from the following:   Height as of this encounter: 5\' 3"  (1.6 m).   Weight as of this encounter: 77.6 kg.     Code Status: Full  Family Communication: Discussed extensively with patient  Disposition Plan: Status is: Observation  The patient remains OBS appropriate and will d/c before 2 midnights.  Dispo: The patient is from: Home              Anticipated d/c is to: SNF              Anticipated d/c date is: 1 day              Patient currently is not medically stable to d/c.    Consultants:  None  Procedures:  None  Antimicrobials:  None  DVT prophylaxis: Heparin   Objective: Vitals:   10/14/19 0245 10/14/19 0516 10/14/19 0752 10/14/19 1149  BP: (!) 142/94  118/82 124/85  Pulse: 70  76 75  Resp: Marland Kitchen)  21  16 16   Temp: (!) 93.5 F (34.2 C) 97.8 F (36.6 C) (!) 97.4 F (36.3 C) 97.7 F (36.5 C)  TempSrc: Rectal Oral Oral Oral  SpO2: 100%  94% 93%  Weight:      Height:        Intake/Output Summary (Last 24 hours) at 10/14/2019 1423 Last data filed at 10/14/2019 0926 Gross per 24 hour  Intake 4051.85 ml  Output --  Net 3500.85 ml   Filed Weights   10/13/19 1016 10/13/19 1756 10/14/19 0041  Weight: 64.4 kg 75.8 kg 77.6 kg    Exam:  General: NAD, blind  Cardiovascular: S1, S2  present  Respiratory: CTAB  Abdomen: Soft, nontender, nondistended, bowel sounds present  Musculoskeletal: 2+ bilateral pedal edema noted, scrotal swelling  Skin: Normal  Psychiatry: Normal mood    Data Reviewed: CBC: Recent Labs  Lab 10/13/19 0525 10/13/19 0803 10/14/19 0336  WBC 5.3  --  5.4  HGB 7.2* 7.5* 7.8*  HCT 23.7* 22.0* 25.5*  MCV 89.1  --  90.1  PLT 267  --  938   Basic Metabolic Panel: Recent Labs  Lab 10/13/19 0525 10/13/19 0803 10/13/19 0957 10/14/19 0336  NA 140 139 139 139  K 4.5 5.1 5.1 4.8  CL 110  --  109 110  CO2 10*  --  20* 21*  GLUCOSE 36*  --  155* 91  BUN 63*  --  60* 58*  CREATININE 4.00*  --  3.86* 3.64*  CALCIUM 8.8*  --  8.6* 8.6*   GFR: Estimated Creatinine Clearance: 23.1 mL/min (A) (by C-G formula based on SCr of 3.64 mg/dL (H)). Liver Function Tests: Recent Labs  Lab 10/13/19 0525  AST 43*  ALT 67*  ALKPHOS 137*  BILITOT 0.4  PROT 6.1*  ALBUMIN 2.5*   Recent Labs  Lab 10/13/19 0525  LIPASE 33   No results for input(s): AMMONIA in the last 168 hours. Coagulation Profile: Recent Labs  Lab 10/13/19 0756  INR 1.1   Cardiac Enzymes: No results for input(s): CKTOTAL, CKMB, CKMBINDEX, TROPONINI in the last 168 hours. BNP (last 3 results) No results for input(s): PROBNP in the last 8760 hours. HbA1C: No results for input(s): HGBA1C in the last 72 hours. CBG: Recent Labs  Lab 10/13/19 2226 10/13/19 2254 10/14/19 0310 10/14/19 0552 10/14/19 1114  GLUCAP 56* 156* 83 72 92   Lipid Profile: No results for input(s): CHOL, HDL, LDLCALC, TRIG, CHOLHDL, LDLDIRECT in the last 72 hours. Thyroid Function Tests: Recent Labs    10/14/19 0336  TSH 2.434   Anemia Panel: No results for input(s): VITAMINB12, FOLATE, FERRITIN, TIBC, IRON, RETICCTPCT in the last 72 hours. Urine analysis:    Component Value Date/Time   COLORURINE YELLOW 10/13/2019 1500   APPEARANCEUR CLOUDY (A) 10/13/2019 1500   LABSPEC 1.015  10/13/2019 1500   PHURINE 5.0 10/13/2019 1500   GLUCOSEU 50 (A) 10/13/2019 1500   HGBUR MODERATE (A) 10/13/2019 1500   BILIRUBINUR NEGATIVE 10/13/2019 1500   KETONESUR NEGATIVE 10/13/2019 1500   PROTEINUR >=300 (A) 10/13/2019 1500   UROBILINOGEN 0.2 12/13/2014 2056   NITRITE NEGATIVE 10/13/2019 1500   LEUKOCYTESUR LARGE (A) 10/13/2019 1500   Sepsis Labs: @LABRCNTIP (procalcitonin:4,lacticidven:4)  ) Recent Results (from the past 240 hour(s))  SARS CORONAVIRUS 2 (TAT 6-24 HRS) Nasopharyngeal Nasopharyngeal Swab     Status: None   Collection Time: 10/05/19  1:27 AM   Specimen: Nasopharyngeal Swab  Result Value Ref Range Status   SARS Coronavirus 2  NEGATIVE NEGATIVE Final    Comment: (NOTE) SARS-CoV-2 target nucleic acids are NOT DETECTED. The SARS-CoV-2 RNA is generally detectable in upper and lower respiratory specimens during the acute phase of infection. Negative results do not preclude SARS-CoV-2 infection, do not rule out co-infections with other pathogens, and should not be used as the sole basis for treatment or other patient management decisions. Negative results must be combined with clinical observations, patient history, and epidemiological information. The expected result is Negative. Fact Sheet for Patients: SugarRoll.be Fact Sheet for Healthcare Providers: https://www.woods-mathews.com/ This test is not yet approved or cleared by the [place] FDA and  has been authorized for detection and/or diagnosis of SARS-CoV-2 by FDA under an Emergency Use Authorization (EUA). This EUA will remain  in effect (meaning this test can be used) for the duration of the COVID-19 declaration under Section 56 4(b)(1) of the Act, 21 U.S.C. section 360bbb-3(b)(1), unless the authorization is terminated or revoked sooner. Performed at [facility], [place] [address] [place], [place] [address]   Urine culture     Status: Abnormal    Collection Time: 10/05/19  6:10 AM   Specimen: Urine, Random  Result Value Ref Range Status   Specimen Description URINE, RANDOM  Final   Special Requests   Final    NONE Performed at [facility], [place] [address], , [place] [address]    Culture 60,000 COLONIES/mL ENTEROCOCCUS FAECALIS (A)  Final   Report Status 10/07/2019 FINAL  Final   Organism ID, Bacteria ENTEROCOCCUS FAECALIS (A)  Final      Susceptibility   Enterococcus faecalis - MIC*    AMPICILLIN <=2 SENSITIVE Sensitive     NITROFURANTOIN <=16 SENSITIVE Sensitive     VANCOMYCIN 1 SENSITIVE Sensitive     * 60,000 COLONIES/mL ENTEROCOCCUS FAECALIS  Gastrointestinal Panel by PCR , Stool     Status: None   Collection Time: 10/13/19  7:50 AM   Specimen: Stool  Result Value Ref Range Status   Campylobacter species NOT DETECTED NOT DETECTED Final   Plesimonas shigelloides NOT DETECTED NOT DETECTED Final   Salmonella species NOT DETECTED NOT DETECTED Final   Yersinia enterocolitica NOT DETECTED NOT DETECTED Final   Vibrio species NOT DETECTED NOT DETECTED Final   Vibrio cholerae NOT DETECTED NOT DETECTED Final   Enteroaggregative E coli (EAEC) NOT DETECTED NOT DETECTED Final   Enteropathogenic E coli (EPEC) NOT DETECTED NOT DETECTED Final   Enterotoxigenic E coli (ETEC) NOT DETECTED NOT DETECTED Final   Shiga like toxin producing E coli (STEC) NOT DETECTED NOT DETECTED Final   Shigella/Enteroinvasive E coli (EIEC) NOT DETECTED NOT DETECTED Final   Cryptosporidium NOT DETECTED NOT DETECTED Final   Cyclospora cayetanensis NOT DETECTED NOT DETECTED Final   Entamoeba histolytica NOT DETECTED NOT DETECTED Final   Giardia lamblia NOT DETECTED NOT DETECTED Final   Adenovirus F40/41 NOT DETECTED NOT DETECTED Final   Astrovirus NOT DETECTED NOT DETECTED Final   Norovirus GI/GII NOT DETECTED NOT DETECTED Final   Rotavirus A NOT DETECTED NOT DETECTED Final   Sapovirus (I, II, IV, and V) NOT DETECTED NOT DETECTED Final     Comment: Performed at [facility], [place] [place], [place] [address]  C Difficile Quick Screen w PCR reflex     Status: None   Collection Time: 10/13/19  7:50 AM   Specimen: Stool  Result Value Ref Range Status   C Diff antigen NEGATIVE NEGATIVE Final   C Diff toxin  NEGATIVE NEGATIVE Final   C Diff interpretation No C. difficile detected.  Final    Comment: Performed at [facility], [place] [address], [place], [place] [address]  Respiratory Panel by RT PCR (Flu A&B, Covid) - Nasopharyngeal Swab     Status: None   Collection Time: 10/13/19 10:19 AM   Specimen: Nasopharyngeal Swab  Result Value Ref Range Status   SARS Coronavirus 2 by RT PCR NEGATIVE NEGATIVE Final    Comment: (NOTE) SARS-CoV-2 target nucleic acids are NOT DETECTED. The SARS-CoV-2 RNA is generally detectable in upper respiratoy specimens during the acute phase of infection. The lowest concentration of SARS-CoV-2 viral copies this assay can detect is 131 copies/mL. A negative result does not preclude SARS-Cov-2 infection and should not be used as the sole basis for treatment or other patient management decisions. A negative result may occur with  improper specimen collection/handling, submission of specimen other than nasopharyngeal swab, presence of viral mutation(s) within the areas targeted by this assay, and inadequate number of viral copies (<131 copies/mL). A negative result must be combined with clinical observations, patient history, and epidemiological information. The expected result is Negative. Fact Sheet for Patients:  PinkCheek.be Fact Sheet for Healthcare Providers:  GravelBags.it This test is not yet ap proved or cleared by the [place] FDA and  has been authorized for detection and/or diagnosis of SARS-CoV-2 by FDA under an Emergency Use Authorization (EUA). This EUA will remain  in effect (meaning this test can be  used) for the duration of the COVID-19 declaration under Section 564(b)(1) of the Act, 21 U.S.C. section 360bbb-3(b)(1), unless the authorization is terminated or revoked sooner.    Influenza A by PCR NEGATIVE NEGATIVE Final   Influenza B by PCR NEGATIVE NEGATIVE Final    Comment: (NOTE) The Xpert Xpress SARS-CoV-2/FLU/RSV assay is intended as an aid in  the diagnosis of influenza from Nasopharyngeal swab specimens and  should not be used as a sole basis for treatment. Nasal washings and  aspirates are unacceptable for Xpert Xpress SARS-CoV-2/FLU/RSV  testing. Fact Sheet for Patients: PinkCheek.be Fact Sheet for Healthcare Providers: GravelBags.it This test is not yet approved or cleared by the [place] FDA and  has been authorized for detection and/or diagnosis of SARS-CoV-2 by  FDA under an Emergency Use Authorization (EUA). This EUA will remain  in effect (meaning this test can be used) for the duration of the  Covid-19 declaration under Section 564(b)(1) of the Act, 21  U.S.C. section 360bbb-3(b)(1), unless the authorization is  terminated or revoked. Performed at [facility], [place] [address], [place], [place] [address]       Studies: No results found.  Scheduled Meds: . aspirin EC  81 mg Oral Daily  . brimonidine  1 drop Left Eye BID  . Chlorhexidine Gluconate Cloth  6 each Topical Daily  . dorzolamide-timolol  1 drop Left Eye BID  . gabapentin  300 mg Oral TID  . gatifloxacin  1 drop Left Eye QID  . heparin  5,000 Units Subcutaneous Q8H  . hydrALAZINE  12.5 mg Oral TID  . insulin aspart  0-9 Units Subcutaneous TID WC  . latanoprost  1 drop Left Eye QHS  . sodium bicarbonate  650 mg Oral TID    Continuous Infusions: . lactated ringers 100 mL/hr at 10/14/19 1202     LOS: 0 days     Alma Friendly, MD [facility]  If 7PM-7AM, please contact  night-coverage [email or web address] 10/14/2019, 2:23  PM

## 2019-10-14 NOTE — Plan of Care (Signed)
  Problem: Education: Goal: Ability to demonstrate management of disease process will improve Outcome: Progressing   Problem: Activity: Goal: Capacity to carry out activities will improve Outcome: Progressing   Problem: Cardiac: Goal: Ability to achieve and maintain adequate cardiopulmonary perfusion will improve Outcome: Progressing   Problem: Health Behavior/Discharge Planning: Goal: Ability to manage health-related needs will improve Outcome: Progressing

## 2019-10-14 NOTE — Evaluation (Addendum)
Physical Therapy Evaluation Patient Details Name: Christopher Davila MRN: [id number] DOB: Jul 04, 1970 Today's Date: 10/14/2019   History of Present Illness  Pt is an 49 y.o. male with PMH DM, hypertension, CKD stage IIIb, peripheral neuropathy, diabetic retinopathy, bilateral vision loss, glaucoma, chronic diastolic heart failure who presented to ED with increased weakness.  Clinical Impression  Pt admitted with above diagnosis. Pt was able to pivot to chair with RW and mod assist of 2 due to bil LE weakness. Difficult for pt to pivot and fatigues quickly.  Feel that pt will need Rehab prior to d/c home.   Pt motivated to get better. Pt currently with functional limitations due to the deficits listed below (see PT Problem List). Pt will benefit from skilled PT to increase their independence and safety with mobility to allow discharge to the venue listed below.      Follow Up Recommendations CIR;Supervision - Intermittent    Equipment Recommendations  Other (comment);3in1 (PT)(tub bench)    Recommendations for Other Services       Precautions / Restrictions Precautions Precautions: Fall;Other (comment) Precaution Comments: blind Restrictions Weight Bearing Restrictions: No      Mobility  Bed Mobility Overal bed mobility: Needs Assistance Bed Mobility: Supine to Sit;Sit to Supine     Supine to sit: Min assist;Min guard     General bed mobility comments: increased time and effort, a little assist needed.   Transfers Overall transfer level: Needs assistance Equipment used: Rolling walker (2 wheeled) Transfers: Sit to/from Stand Sit to Stand: From elevated surface;Mod assist;Max assist;+2 physical assistance         General transfer comment: Pt needed mod to max assist to stand to RW due to bil LE weakness.  Pt reports he feels like LEs wont hold him maintaining bil knee flexion.  Pt only able to take pivotal steps to the recliner from the bed due to bil LE weakness.  Needed cues  for sequencing steps and RW.   Ambulation/Gait                Stairs            Wheelchair Mobility    Modified Rankin (Stroke Patients Only)       Balance Overall balance assessment: Needs assistance Sitting-balance support: No upper extremity supported;Feet supported Sitting balance-Leahy Scale: Good     Standing balance support: Bilateral upper extremity supported;During functional activity Standing balance-Leahy Scale: Poor Standing balance comment: relies on UE support for balance.                              Pertinent Vitals/Pain Pain Assessment: No/denies pain    Home Living Family/patient expects to be discharged to:: Private residence Living Arrangements: Other relatives Available Help at Discharge: Family;Available PRN/intermittently(cousin who works two jobs) Type of Home: [employer] Home Access: Stairs to enter Entrance Stairs-Rails: Psychiatric nurse of Steps: [place]: One level [place]: Environmental consultant - 2 wheels Additional Comments: needs tub bench    Prior Function Level of Independence: Needs assistance   Gait / Transfers Assistance Needed: Pt has been using RW for 2 weeks; Reports could ambulate in community  ADL's / [place] Needed: Reports still independent with ADLS; reports cousin doing IADLs recently  Comments: Pt was working at [employer]  in December and independent ; since that time on steady decline in mobility     [place]  Extremity/Trunk Assessment   Upper Extremity Assessment Upper Extremity Assessment: Defer to OT evaluation    Lower Extremity Assessment RLE Deficits / Details: ROM WFL; MMT 3+/5; pitting edema in lower leg and foot LLE Deficits / Details: ROM WFL; MMT 3+/5; pitting edema in lower leg and foot    Cervical / Trunk Assessment Cervical / Trunk Assessment: Normal  Communication   Communication: No difficulties  Cognition Arousal/Alertness:  Awake/alert Behavior During Therapy: Flat affect Overall Cognitive Status: Within Functional Limits for tasks assessed                                        General Comments General comments (skin integrity, edema, etc.): VSS.      Exercises     Assessment/Plan    PT Assessment Patient needs continued PT services  PT Problem List Decreased strength;Decreased mobility;Decreased safety awareness;Decreased activity tolerance;Decreased balance;Decreased knowledge of use of DME       PT Treatment Interventions DME instruction;Therapeutic exercise;Gait training;Balance training;Stair training;Functional mobility training;Therapeutic activities;Patient/family education    PT Goals (Current goals can be found in the Care Plan section)  Acute Rehab PT Goals Patient Stated Goal: get stronger PT Goal Formulation: With patient Time For Goal Achievement: 10/28/19 Potential to Achieve Goals: Good    Frequency Min 2X/week   Barriers to discharge Decreased caregiver support      Co-evaluation               AM-PAC PT "6 Clicks" Mobility  Outcome Measure Help needed turning from your back to your side while in a flat bed without using bedrails?: A Little Help needed moving from lying on your back to sitting on the side of a flat bed without using bedrails?: A Little Help needed moving to and from a bed to a chair (including a wheelchair)?: A Lot Help needed standing up from a chair using your arms (e.g., wheelchair or bedside chair)?: A Lot Help needed to walk in hospital room?: Total Help needed climbing 3-5 steps with a railing? : Total 6 Click Score: 12    End of Session Equipment Utilized During Treatment: Gait belt Activity Tolerance: Patient limited by fatigue Patient left: with call bell/phone within reach;in chair;with chair alarm set Nurse Communication: Mobility status PT Visit Diagnosis: Unsteadiness on feet (R26.81);Muscle weakness (generalized)  (M62.81);History of falling (Z91.81)    Time: 8889-1694 PT Time Calculation (min) (ACUTE ONLY): 23 min   Charges:   PT Evaluation $PT Eval Moderate Complexity: 1 Mod PT Treatments $Therapeutic Activity: 8-22 mins        Shatavia Santor W,PT Acute Rehabilitation Services Pager:  [phone]  Office:  [place] 10/14/2019, 1:35 PM

## 2019-10-14 NOTE — Progress Notes (Signed)
   10/14/19 0245  Vitals  Temp (!) 93.5 F (34.2 C)  Temp Source Rectal  BP (!) 142/94  MAP (mmHg) 111  BP Location Right Arm  BP Method Automatic  Patient Position (if appropriate) Lying  Pulse Rate 70  Pulse Rate Source Monitor  Resp (!) 21  Oxygen Therapy  SpO2 100 %  O2 Device Nasal Cannula  O2 Flow Rate (L/min) 1 L/min  MEWS Score  MEWS Temp 2  MEWS Systolic 0  MEWS Pulse 0  MEWS RR 1  MEWS LOC 0  MEWS Score 3  MEWS Score Color Yellow  Provider Notification  Provider Name/Title Dr Myna Hidalgo   Date Provider Notified 10/14/19  Time Provider Notified 0302  Notification Type Page  Notification Reason Change in status  Response Other (Comment) (Awaiting new orders )  Rapid Response Notification  Name of Rapid Response RN Notified Mindy Hopper  Date Rapid Response Notified 10/14/19  Time Rapid Response Notified 0304  Dr Myna Hidalgo notified. Order to place bair huggar and draw additional labs. Bair huggar placed. Will continue to monitor.

## 2019-10-14 NOTE — Evaluation (Addendum)
Occupational Therapy Evaluation Patient Details Name: Christopher Davila MRN: [id number] DOB: 1970/09/17 Today's Date: 10/14/2019    History of Present Illness Pt is an 49 y.o. male with PMH DM, hypertension, CKD stage IIIb, peripheral neuropathy, diabetic retinopathy, bilateral vision loss, glaucoma, chronic diastolic heart failure who presented to ED with increased weakness.   Clinical Impression   PTA pt living with cousin, states he was independent for BADL/IADL until very recently. Per pt report, he was using a RW and having assist for IADLs leading up to admission. Pt in chair on arrival, where stedy was used for functional transfer. Pt completed sit <> stand in stedy with mod A. Once in stedy, pt was able to stand with min A from higher surface. Mod A needed to return back to bed for support of BLEs. Noted cognitive deficits in problem solving, awareness of deficits, and memory throughout session. Pt is also blind at baseline. Given current status, recommend SNF at d/c to continue to progress safe BADL prior to returning home. Will continue to follow per POC listed below.     Follow Up Recommendations  SNF;Supervision/Assistance - 24 hour    Equipment Recommendations  3 in 1 bedside commode    Recommendations for Other Services       Precautions / Restrictions Precautions Precautions: Fall;Other (comment) Precaution Comments: blind Restrictions Weight Bearing Restrictions: No      Mobility Bed Mobility Overal bed mobility: Needs Assistance Bed Mobility: Sit to Supine       Sit to supine: Mod assist   General bed mobility comments: mod A to guide BLEs back onto bed  Transfers Overall transfer level: Needs assistance Equipment used: Ambulation equipment used Transfers: Sit to/from Stand Sit to Stand: Mod assist         General transfer comment: mod A to rise hips into stedy and extend hips to place flaps. Sit <> stand improved with standing from stedy seat to ~ min  A.    Balance Overall balance assessment: Needs assistance Sitting-balance support: No upper extremity supported;Feet supported Sitting balance-Leahy Scale: Good     Standing balance support: Bilateral upper extremity supported;During functional activity Standing balance-Leahy Scale: Poor Standing balance comment: relies on UE support for balance.                            ADL either performed or assessed with clinical judgement   ADL Overall ADL's : Needs assistance/impaired Eating/Feeding: Set up;Sitting   Grooming: Set up;Sitting   Upper Body Bathing: Minimal assistance;Sitting   Lower Body Bathing: Maximal assistance;Sit to/from stand;Sitting/lateral leans   Upper Body Dressing : Minimal assistance;Sitting   Lower Body Dressing: Maximal assistance;Sit to/from stand;Sitting/lateral leans   Toilet Transfer: Moderate assistance;Cueing for safety Toilet Transfer Details (indicate cue type and reason): mod A with use of stedy Toileting- Clothing Manipulation and Hygiene: Maximal assistance;Sit to/from stand;Sitting/lateral lean   Tub/ Shower Transfer: Moderate assistance;Maximal assistance;Stand-pivot;Shower seat;Rolling walker   Functional mobility during ADLs: Moderate assistance(use of stedy for transfer)       Vision   Vision Assessment?: Vision impaired- to be further tested in functional context Additional Comments: pt reports in the past few months his vision has declined and is in need of surgery. Pt not able to identify colors or objects near/far upon assessment     Perception     Praxis      Pertinent Vitals/Pain Pain Assessment: No/denies pain     Hand Dominance  Extremity/Trunk Assessment Upper Extremity Assessment Upper Extremity Assessment: Generalized weakness   Lower Extremity Assessment Lower Extremity Assessment: Defer to PT evaluation       Communication Communication Communication: No difficulties   Cognition  Arousal/Alertness: Awake/alert Behavior During Therapy: Flat affect Overall Cognitive Status: Impaired/Different from baseline Area of Impairment: Safety/judgement;Memory;Problem solving                     Memory: Decreased short-term memory   Safety/Judgement: Decreased awareness of deficits   Problem Solving: Slow processing;Requires verbal cues General Comments: increased processing time and cues needed throughout basic mobility tasks. Poor recall of days events   General Comments       Exercises     Shoulder Instructions      Home Living Family/patient expects to be discharged to:: Private residence Living Arrangements: Other relatives(cousin) Available Help at Discharge: Family;Available PRN/intermittently(cousin who works two jobs) Type of Home: [employer] Access: Stairs to enter Technical brewer of Steps: 4 Entrance Stairs-Rails: [place]: One level     Bathroom Shower/Tub: Teacher, early years/pre: [place]: Environmental consultant - 2 wheels          Prior Functioning/Environment Level of Independence: Needs assistance  Gait / Transfers Assistance Needed: Pt has been using RW for 2 weeks; Reports could ambulate in community ADL's / Bunker Needed: Reports still independent with ADLS; reports cousin doing IADLs recently   Comments: Pt was working at [employer]  in December and independent ; since that time on steady decline in mobility        OT Problem List: Decreased strength;Impaired vision/perception;Decreased knowledge of use of DME or AE;Decreased activity tolerance;Cardiopulmonary status limiting activity;Decreased cognition;Impaired balance (sitting and/or standing)      OT Treatment/Interventions: Self-care/ADL training;Therapeutic exercise;Patient/family education;Balance training;Energy conservation;Therapeutic activities;DME and/or AE instruction    OT Goals(Current goals can be found  in the care plan section) Acute Rehab OT Goals Patient Stated Goal: get stronger OT Goal Formulation: With patient Time For Goal Achievement: 10/28/19 Potential to Achieve Goals: Good  OT Frequency: Min 2X/week   Barriers to D/C:            Co-evaluation              AM-PAC OT "6 Clicks" Daily Activity     Outcome Measure Help from another person eating meals?: None Help from another person taking care of personal grooming?: A Little Help from another person toileting, which includes using toliet, bedpan, or urinal?: A Lot Help from another person bathing (including washing, rinsing, drying)?: A Lot Help from another person to put on and taking off regular upper body clothing?: A Little Help from another person to put on and taking off regular lower body clothing?: A Lot 6 Click Score: 16   End of Session Nurse Communication: Mobility status;Need for lift equipment  Activity Tolerance: Patient tolerated treatment well Patient left: in bed;with call bell/phone within reach;with nursing/sitter in room  OT Visit Diagnosis: Unsteadiness on feet (R26.81);Other abnormalities of gait and mobility (R26.89);Muscle weakness (generalized) (M62.81);Low vision, both eyes (H54.2)                Time: 8413-2440 OT Time Calculation (min): 22 min Charges:  OT General Charges $OT Visit: 1 Visit OT Evaluation $OT Eval Moderate Complexity: [place], MSOT, OTR/L [place] [facility] Office Number: [phone] Pager: [phone]  Zenovia Jarred 10/14/2019, 5:48 PM

## 2019-10-14 NOTE — TOC Progression Note (Signed)
Transition of Care [facility]) - Progression Note    Patient Details  Name: Christopher Davila MRN: [id number] Date of Birth: 07/17/1970  [facility] of [facility]) CM/SW Contact  Zenon Mayo, RN Phone Number: 10/14/2019, 12:27 PM  Clinical Narrative:    from home with cousin, who says she can not take care of him anymore, he will need a shower chair and a bsc if goes home.  Maryann Placey with [facility]t [facility] is his PCP, he gets his meds free from the [facility]  He is active with [facility] for HHPT.  Per pt eval rec SNF, he states he is agreeable to SNF.        Expected Discharge Plan and Services                                                 Social Determinants of Health (SDOH) Interventions    Readmission Risk Interventions Readmission Risk Prevention Plan 02/19/2019  Transportation Screening Complete  PCP or Specialist Appt within 5-7 Days Complete  Home Care Screening Complete  Medication Review (RN CM) Complete  Some recent data might be hidden

## 2019-10-15 DIAGNOSIS — Z515 Encounter for palliative care: Secondary | ICD-10-CM | POA: Diagnosis not present

## 2019-10-15 DIAGNOSIS — R443 Hallucinations, unspecified: Secondary | ICD-10-CM | POA: Diagnosis not present

## 2019-10-15 DIAGNOSIS — E1141 Type 2 diabetes mellitus with diabetic mononeuropathy: Secondary | ICD-10-CM | POA: Diagnosis present

## 2019-10-15 DIAGNOSIS — E11649 Type 2 diabetes mellitus with hypoglycemia without coma: Secondary | ICD-10-CM | POA: Diagnosis present

## 2019-10-15 DIAGNOSIS — J9621 Acute and chronic respiratory failure with hypoxia: Secondary | ICD-10-CM | POA: Diagnosis not present

## 2019-10-15 DIAGNOSIS — Z20822 Contact with and (suspected) exposure to covid-19: Secondary | ICD-10-CM | POA: Diagnosis present

## 2019-10-15 DIAGNOSIS — N179 Acute kidney failure, unspecified: Secondary | ICD-10-CM | POA: Diagnosis present

## 2019-10-15 DIAGNOSIS — K3184 Gastroparesis: Secondary | ICD-10-CM | POA: Diagnosis present

## 2019-10-15 DIAGNOSIS — I132 Hypertensive heart and chronic kidney disease with heart failure and with stage 5 chronic kidney disease, or end stage renal disease: Secondary | ICD-10-CM | POA: Diagnosis present

## 2019-10-15 DIAGNOSIS — R197 Diarrhea, unspecified: Secondary | ICD-10-CM | POA: Diagnosis present

## 2019-10-15 DIAGNOSIS — G9341 Metabolic encephalopathy: Secondary | ICD-10-CM | POA: Diagnosis not present

## 2019-10-15 DIAGNOSIS — Y846 Urinary catheterization as the cause of abnormal reaction of the patient, or of later complication, without mention of misadventure at the time of the procedure: Secondary | ICD-10-CM | POA: Diagnosis not present

## 2019-10-15 DIAGNOSIS — T8383XA Hemorrhage of genitourinary prosthetic devices, implants and grafts, initial encounter: Secondary | ICD-10-CM | POA: Diagnosis not present

## 2019-10-15 DIAGNOSIS — H548 Legal blindness, as defined in USA: Secondary | ICD-10-CM | POA: Diagnosis present

## 2019-10-15 DIAGNOSIS — N2581 Secondary hyperparathyroidism of renal origin: Secondary | ICD-10-CM | POA: Diagnosis not present

## 2019-10-15 DIAGNOSIS — E785 Hyperlipidemia, unspecified: Secondary | ICD-10-CM | POA: Diagnosis present

## 2019-10-15 DIAGNOSIS — S3730XA Unspecified injury of urethra, initial encounter: Secondary | ICD-10-CM | POA: Diagnosis not present

## 2019-10-15 DIAGNOSIS — E1122 Type 2 diabetes mellitus with diabetic chronic kidney disease: Secondary | ICD-10-CM | POA: Diagnosis present

## 2019-10-15 DIAGNOSIS — E872 Acidosis: Secondary | ICD-10-CM | POA: Diagnosis present

## 2019-10-15 DIAGNOSIS — I5033 Acute on chronic diastolic (congestive) heart failure: Secondary | ICD-10-CM | POA: Diagnosis present

## 2019-10-15 DIAGNOSIS — Y92239 Unspecified place in hospital as the place of occurrence of the external cause: Secondary | ICD-10-CM | POA: Diagnosis not present

## 2019-10-15 DIAGNOSIS — N186 End stage renal disease: Secondary | ICD-10-CM | POA: Diagnosis present

## 2019-10-15 DIAGNOSIS — D62 Acute posthemorrhagic anemia: Secondary | ICD-10-CM | POA: Diagnosis not present

## 2019-10-15 DIAGNOSIS — E1143 Type 2 diabetes mellitus with diabetic autonomic (poly)neuropathy: Secondary | ICD-10-CM | POA: Diagnosis present

## 2019-10-15 DIAGNOSIS — E871 Hypo-osmolality and hyponatremia: Secondary | ICD-10-CM | POA: Diagnosis not present

## 2019-10-15 DIAGNOSIS — E11319 Type 2 diabetes mellitus with unspecified diabetic retinopathy without macular edema: Secondary | ICD-10-CM | POA: Diagnosis present

## 2019-10-15 LAB — BRAIN NATRIURETIC PEPTIDE: B Natriuretic Peptide: 633.1 pg/mL — ABNORMAL HIGH (ref 0.0–100.0)

## 2019-10-15 LAB — CBC WITH DIFFERENTIAL/PLATELET
Abs Immature Granulocytes: 0.05 10*3/uL (ref 0.00–0.07)
Basophils Absolute: 0 10*3/uL (ref 0.0–0.1)
Basophils Relative: 0 %
Eosinophils Absolute: 0.1 10*3/uL (ref 0.0–0.5)
Eosinophils Relative: 2 %
HCT: 23.8 % — ABNORMAL LOW (ref 39.0–52.0)
Hemoglobin: 7.3 g/dL — ABNORMAL LOW (ref 13.0–17.0)
Immature Granulocytes: 1 %
Lymphocytes Relative: 19 %
Lymphs Abs: 1.2 10*3/uL (ref 0.7–4.0)
MCH: 27.2 pg (ref 26.0–34.0)
MCHC: 30.7 g/dL (ref 30.0–36.0)
MCV: 88.8 fL (ref 80.0–100.0)
Monocytes Absolute: 0.8 10*3/uL (ref 0.1–1.0)
Monocytes Relative: 13 %
Neutro Abs: 4.1 10*3/uL (ref 1.7–7.7)
Neutrophils Relative %: 65 %
Platelets: 254 10*3/uL (ref 150–400)
RBC: 2.68 MIL/uL — ABNORMAL LOW (ref 4.22–5.81)
RDW: 14.4 % (ref 11.5–15.5)
WBC: 6.3 10*3/uL (ref 4.0–10.5)
nRBC: 0 % (ref 0.0–0.2)

## 2019-10-15 LAB — BASIC METABOLIC PANEL
Anion gap: 9 (ref 5–15)
BUN: 63 mg/dL — ABNORMAL HIGH (ref 6–20)
CO2: 21 mmol/L — ABNORMAL LOW (ref 22–32)
Calcium: 8 mg/dL — ABNORMAL LOW (ref 8.9–10.3)
Chloride: 107 mmol/L (ref 98–111)
Creatinine, Ser: 4.04 mg/dL — ABNORMAL HIGH (ref 0.61–1.24)
GFR calc Af Amer: 19 mL/min — ABNORMAL LOW (ref >60)
GFR calc non Af Amer: 16 mL/min — ABNORMAL LOW (ref >60)
Glucose, Bld: 94 mg/dL (ref 70–99)
Potassium: 4.8 mmol/L (ref 3.5–5.1)
Sodium: 137 mmol/L (ref 135–145)

## 2019-10-15 LAB — GLUCOSE, CAPILLARY
Glucose-Capillary: 70 mg/dL (ref 70–99)
Glucose-Capillary: 134 mg/dL — ABNORMAL HIGH (ref 70–99)
Glucose-Capillary: 133 mg/dL — ABNORMAL HIGH (ref 70–99)
Glucose-Capillary: 167 mg/dL — ABNORMAL HIGH (ref 70–99)

## 2019-10-15 LAB — FERRITIN: Ferritin: 94 ng/mL (ref 24–336)

## 2019-10-15 LAB — IRON AND TIBC
Iron: 73 ug/dL (ref 45–182)
Saturation Ratios: 22 % (ref 17.9–39.5)
TIBC: 332 ug/dL (ref 250–450)
UIBC: 259 ug/dL

## 2019-10-15 MED ORDER — FUROSEMIDE 10 MG/ML IJ SOLN
80.0000 mg | Freq: Once | INTRAMUSCULAR | Status: AC
  Administered 2019-10-15: 80 mg via INTRAVENOUS
  Filled 2019-10-15: qty 8

## 2019-10-15 MED ORDER — SODIUM BICARBONATE 650 MG PO TABS
650.0000 mg | ORAL_TABLET | Freq: Every day | ORAL | Status: DC
  Administered 2019-10-16 – 2019-10-27 (×12): 650 mg via ORAL
  Filled 2019-10-15 (×13): qty 1

## 2019-10-15 MED ORDER — DARBEPOETIN ALFA 40 MCG/0.4ML IJ SOSY
40.0000 ug | PREFILLED_SYRINGE | Freq: Once | INTRAMUSCULAR | Status: AC
  Administered 2019-10-15: 40 ug via SUBCUTANEOUS
  Filled 2019-10-15: qty 0.4

## 2019-10-15 MED ORDER — GABAPENTIN 100 MG PO CAPS
100.0000 mg | ORAL_CAPSULE | Freq: Three times a day (TID) | ORAL | Status: DC
  Administered 2019-10-15 – 2019-10-27 (×33): 100 mg via ORAL
  Filled 2019-10-15 (×33): qty 1

## 2019-10-15 NOTE — Progress Notes (Signed)
PROGRESS NOTE  Christopher Davila MPN:[id number] DOB: 11/29/1970 DOA: 10/13/2019 PCP: Marliss Coots, NP  HPI/Recap of past 24 hours: HPI from Dr Lorin Mercy Oluwatomisin Christopher Davila is a 49 y.o. male with medical history significant of HTN; DM with gastroparesis and blindness; stage 4 CKD; and chronic diastolic CHF presenting with diarrhea and weakness.  He was last admitted from 4/10-12 for acute on chronic diastolic CHF.  He returned to the ER on 4/20 with failure to thrive and weakness, but did not qualify for hospitalization at that time and is not able to be placed in SNF.  He reports that he has been having diarrhea 1 day PTA. He wonders if he ate something that upset his stomach. No nausea or vomiting. No recent antibiotics other than eye drops.  He has been having some problems with weakness in his legs and arms for a few days and it also happened last week. PT came to see him at home last week. In the ED, severe acidosis with gap. Giving Mag, bicarb, LR.  Patient admitted for further management    Today, pt reported feeling SOB, noted to have worsening peripheral edema.  Patient denies any chest pain, nausea/vomiting, fever/chills.    Assessment/Plan: Principal Problem:   Diarrhea Active Problems:   Type 2 diabetes mellitus with hypoglycemia (HCC)   Essential hypertension   Hyperlipidemia   CKD (chronic kidney disease) stage 4, GFR 15-29 ml/min (HCC)   Weakness   Metabolic acidosis, increased anion gap   Acute urinary retention  Diarrhea Currently improved C. difficile negative, stool panel negative DC IV hydration Supportive care  Acute on chronic diastolic HF Significant bilateral lower extremity edema/scrotal swelling Noted to have progressive peripheral edema BNP 633 Echo on 01/2019 showed EF of 55 to 60%, left ventricular diastolic Doppler parameters are consistent with pseudonormalization Due to CKD, nephrology consulted to assist in fluid management, appreciate recs Agree with Lasix  with close monitoring of BMP Strict I's and O's, daily weights  CKD stage IV/anion gap metabolic acidosis Creatinine around baseline Improved acidosis D/C IVF Nephrology on board Continue bicarb Daily BMP  Anemia of CKD Anemia panel pending Follow CBC closely  Hypertension BP stable Continue to hold Coreg, continue hydralazine  Diabetes mellitus type 2 Episode of hypoglycemia on 10/13/2019 SSI, Accu-Cheks, hypoglycemic protocol Consider discontinuation of Glucotrol  Generalized weakness/failure to thrive Patient lives with a cousin, who walks and unable to care for him at home PT/OT, recommend SNF (not a candidate unless he can pay privately) TOC consulted-plan to DC home with home health services  Urinary retention Continue indwelling Foley catheter Outpatient follow-up  Blindness Continue eyedrops Awaiting to have surgery        Malnutrition Type:      Malnutrition Characteristics:      Nutrition Interventions:       Estimated body mass index is 30.29 kg/m as calculated from the following:   Height as of this encounter: 5\' 3"  (1.6 m).   Weight as of this encounter: 77.6 kg.     Code Status: Full  Family Communication: Discussed extensively with patient  Disposition Plan: Status is: Inpatient    Dispo: The patient is from: Home              Anticipated d/c is to: Likely DC home with home health services              Anticipated d/c date is: In 2 days  Patient currently is not medically stable to d/c.  Patient requires significant IV diuresis, nephrology signed off    Consultants:  Nephrology  Procedures:  None  Antimicrobials:  None  DVT prophylaxis: Heparin   Objective: Vitals:   10/15/19 0250 10/15/19 0542 10/15/19 0748 10/15/19 1732  BP:  120/89 131/83 (!) 144/88  Pulse:  76 77 82  Resp:  20 17 17   Temp:  (!) 97.4 F (36.3 C) 97.6 F (36.4 C) 97.8 F (36.6 C)  TempSrc:  Oral Oral Oral  SpO2:  100%  98% 94%  Weight: 77.6 kg     Height:        Intake/Output Summary (Last 24 hours) at 10/15/2019 1748 Last data filed at 10/15/2019 1300 Gross per 24 hour  Intake 1020 ml  Output 550 ml  Net 470 ml   Filed Weights   10/13/19 1756 10/14/19 0041 10/15/19 0250  Weight: 75.8 kg 77.6 kg 77.6 kg    Exam:  General: NAD, blind  Cardiovascular: S1, S2 present  Respiratory: CTAB  Abdomen: Soft, nontender, nondistended, bowel sounds present  Musculoskeletal: 2+ bilateral pedal edema noted, scrotal swelling  Skin: Normal  Psychiatry: Normal mood    Data Reviewed: CBC: Recent Labs  Lab 10/13/19 0525 10/13/19 0803 10/14/19 0336 10/15/19 0444  WBC 5.3  --  5.4 6.3  NEUTROABS  --   --   --  4.1  HGB 7.2* 7.5* 7.8* 7.3*  HCT 23.7* 22.0* 25.5* 23.8*  MCV 89.1  --  90.1 88.8  PLT 267  --  276 323   Basic Metabolic Panel: Recent Labs  Lab 10/13/19 0525 10/13/19 0803 10/13/19 0957 10/14/19 0336 10/15/19 0444  NA 140 139 139 139 137  K 4.5 5.1 5.1 4.8 4.8  CL 110  --  109 110 107  CO2 10*  --  20* 21* 21*  GLUCOSE 36*  --  155* 91 94  BUN 63*  --  60* 58* 63*  CREATININE 4.00*  --  3.86* 3.64* 4.04*  CALCIUM 8.8*  --  8.6* 8.6* 8.0*   GFR: Estimated Creatinine Clearance: 20.8 mL/min (A) (by C-G formula based on SCr of 4.04 mg/dL (H)). Liver Function Tests: Recent Labs  Lab 10/13/19 0525  AST 43*  ALT 67*  ALKPHOS 137*  BILITOT 0.4  PROT 6.1*  ALBUMIN 2.5*   Recent Labs  Lab 10/13/19 0525  LIPASE 33   No results for input(s): AMMONIA in the last 168 hours. Coagulation Profile: Recent Labs  Lab 10/13/19 0756  INR 1.1   Cardiac Enzymes: No results for input(s): CKTOTAL, CKMB, CKMBINDEX, TROPONINI in the last 168 hours. BNP (last 3 results) No results for input(s): PROBNP in the last 8760 hours. HbA1C: No results for input(s): HGBA1C in the last 72 hours. CBG: Recent Labs  Lab 10/14/19 1610 10/14/19 2117 10/15/19 0613 10/15/19 1116  10/15/19 1702  GLUCAP 144* 111* 70 134* 133*   Lipid Profile: No results for input(s): CHOL, HDL, LDLCALC, TRIG, CHOLHDL, LDLDIRECT in the last 72 hours. Thyroid Function Tests: Recent Labs    10/14/19 0336  TSH 2.434   Anemia Panel: No results for input(s): VITAMINB12, FOLATE, FERRITIN, TIBC, IRON, RETICCTPCT in the last 72 hours. Urine analysis:    Component Value Date/Time   COLORURINE YELLOW 10/13/2019 1500   APPEARANCEUR CLOUDY (A) 10/13/2019 1500   LABSPEC 1.015 10/13/2019 1500   PHURINE 5.0 10/13/2019 1500   GLUCOSEU 50 (A) 10/13/2019 1500   HGBUR MODERATE (A) 10/13/2019 1500  BILIRUBINUR NEGATIVE 10/13/2019 1500   KETONESUR NEGATIVE 10/13/2019 1500   PROTEINUR >=300 (A) 10/13/2019 1500   UROBILINOGEN 0.2 12/13/2014 2056   NITRITE NEGATIVE 10/13/2019 1500   LEUKOCYTESUR LARGE (A) 10/13/2019 1500   Sepsis Labs: @LABRCNTIP (procalcitonin:4,lacticidven:4)  ) Recent Results (from the past 240 hour(s))  Gastrointestinal Panel by PCR , Stool     Status: None   Collection Time: 10/13/19  7:50 AM   Specimen: Stool  Result Value Ref Range Status   Campylobacter species NOT DETECTED NOT DETECTED Final   Plesimonas shigelloides NOT DETECTED NOT DETECTED Final   Salmonella species NOT DETECTED NOT DETECTED Final   Yersinia enterocolitica NOT DETECTED NOT DETECTED Final   Vibrio species NOT DETECTED NOT DETECTED Final   Vibrio cholerae NOT DETECTED NOT DETECTED Final   Enteroaggregative E coli (EAEC) NOT DETECTED NOT DETECTED Final   Enteropathogenic E coli (EPEC) NOT DETECTED NOT DETECTED Final   Enterotoxigenic E coli (ETEC) NOT DETECTED NOT DETECTED Final   Shiga like toxin producing E coli (STEC) NOT DETECTED NOT DETECTED Final   Shigella/Enteroinvasive E coli (EIEC) NOT DETECTED NOT DETECTED Final   Cryptosporidium NOT DETECTED NOT DETECTED Final   Cyclospora cayetanensis NOT DETECTED NOT DETECTED Final   Entamoeba histolytica NOT DETECTED NOT DETECTED Final    Giardia lamblia NOT DETECTED NOT DETECTED Final   Adenovirus F40/41 NOT DETECTED NOT DETECTED Final   Astrovirus NOT DETECTED NOT DETECTED Final   Norovirus GI/GII NOT DETECTED NOT DETECTED Final   Rotavirus A NOT DETECTED NOT DETECTED Final   Sapovirus (I, II, IV, and V) NOT DETECTED NOT DETECTED Final    Comment: Performed at [facility], [address] [place], [place] [address]  C Difficile Quick Screen w PCR reflex     Status: None   Collection Time: 10/13/19  7:50 AM   Specimen: Stool  Result Value Ref Range Status   C Diff antigen NEGATIVE NEGATIVE Final   C Diff toxin NEGATIVE NEGATIVE Final   C Diff interpretation No C. difficile detected.  Final    Comment: Performed at [facility], [place] [address], [place], [place] [address]  Respiratory Panel by RT PCR (Flu A&B, Covid) - Nasopharyngeal Swab     Status: None   Collection Time: 10/13/19 10:19 AM   Specimen: Nasopharyngeal Swab  Result Value Ref Range Status   SARS Coronavirus 2 by RT PCR NEGATIVE NEGATIVE Final    Comment: (NOTE) SARS-CoV-2 target nucleic acids are NOT DETECTED. The SARS-CoV-2 RNA is generally detectable in upper respiratoy specimens during the acute phase of infection. The lowest concentration of SARS-CoV-2 viral copies this assay can detect is 131 copies/mL. A negative result does not preclude SARS-Cov-2 infection and should not be used as the sole basis for treatment or other patient management decisions. A negative result may occur with  improper specimen collection/handling, submission of specimen other than nasopharyngeal swab, presence of viral mutation(s) within the areas targeted by this assay, and inadequate number of viral copies (<131 copies/mL). A negative result must be combined with clinical observations, patient history, and epidemiological information. The expected result is Negative. Fact Sheet for Patients:  PinkCheek.be Fact Sheet for  Healthcare Providers:  GravelBags.it This test is not yet ap proved or cleared by the [place] FDA and  has been authorized for detection and/or diagnosis of SARS-CoV-2 by FDA under an Emergency Use Authorization (EUA). This EUA will remain  in effect (meaning this test can be used) for the duration  of the COVID-19 declaration under Section 564(b)(1) of the Act, 21 U.S.C. section 360bbb-3(b)(1), unless the authorization is terminated or revoked sooner.    Influenza A by PCR NEGATIVE NEGATIVE Final   Influenza B by PCR NEGATIVE NEGATIVE Final    Comment: (NOTE) The Xpert Xpress SARS-CoV-2/FLU/RSV assay is intended as an aid in  the diagnosis of influenza from Nasopharyngeal swab specimens and  should not be used as a sole basis for treatment. Nasal washings and  aspirates are unacceptable for Xpert Xpress SARS-CoV-2/FLU/RSV  testing. Fact Sheet for Patients: PinkCheek.be Fact Sheet for Healthcare Providers: GravelBags.it This test is not yet approved or cleared by the [place] FDA and  has been authorized for detection and/or diagnosis of SARS-CoV-2 by  FDA under an Emergency Use Authorization (EUA). This EUA will remain  in effect (meaning this test can be used) for the duration of the  Covid-19 declaration under Section 564(b)(1) of the Act, 21  U.S.C. section 360bbb-3(b)(1), unless the authorization is  terminated or revoked. Performed at [facility], [place] [address] [place], [place] [address]       Studies: No results found.  Scheduled Meds: . aspirin EC  81 mg Oral Daily  . brimonidine  1 drop Left Eye BID  . Chlorhexidine Gluconate Cloth  6 each Topical Daily  . dorzolamide-timolol  1 drop Left Eye BID  . gabapentin  300 mg Oral TID  . gatifloxacin  1 drop Left Eye QID  . heparin  5,000 Units Subcutaneous Q8H  . hydrALAZINE  12.5 mg Oral TID  . insulin aspart  0-9  Units Subcutaneous TID WC  . latanoprost  1 drop Left Eye QHS  . [START ON 10/16/2019] sodium bicarbonate  650 mg Oral Daily    Continuous Infusions:    LOS: 0 days     Alma Friendly, MD [facility]  If 7PM-7AM, please contact night-coverage [email or web address] 10/15/2019, 5:48 PM

## 2019-10-15 NOTE — Progress Notes (Signed)
Physical Therapy Treatment Patient Details Name: Christopher Davila MRN: [id number] DOB: 1971-04-29 Today's Date: 10/15/2019    History of Present Illness Pt is an 49 y.o. male with PMH DM, hypertension, CKD stage IIIb, peripheral neuropathy, diabetic retinopathy, bilateral vision loss, glaucoma, chronic diastolic heart failure who presented to ED with increased weakness.    PT Comments    Pt supine in bed agreeable to work with therapy. Pt with continued complaints of LE weakness. Pt min A for bed mobility. Pt utilized St[place] to come to standing with modAx2. Pt able to sit>stand x10 in [place], progressing to min guard for power up and steadying. CIR evaluated pt and reports he would better be served in a SNF setting. PT will continue to progress mobility until discharge.      Follow Up Recommendations  Supervision - Intermittent;SNF     Equipment Recommendations  Other (comment);3in1 (PT)(tub bench)       Precautions / Restrictions Precautions Precautions: Fall;Other (comment) Precaution Comments: blind Restrictions Weight Bearing Restrictions: No    Mobility  Bed Mobility Overal bed mobility: Needs Assistance Bed Mobility: Supine to Sit;Sit to Supine     Supine to sit: Min assist     General bed mobility comments: minA and maximal cuing for coming to EoB, pt started to move LE to EoB and then pulled up against therapist to come to longsitting in bed, pt then needed min A for pivoting hips to EoB  Transfers Overall transfer level: Needs assistance   Transfers: Sit to/from Stand Sit to Stand: From elevated surface;Mod assist;+2 physical assistance;Min assist;Min guard         General transfer comment: modAx2 for initial power up to [facility], able to complete 9 more sit>stand progressing to min guard A for last 4       Balance Overall balance assessment: Needs assistance Sitting-balance support: No upper extremity supported;Feet supported Sitting balance-Leahy Scale: Good     Standing balance support: Bilateral upper extremity supported;During functional activity Standing balance-Leahy Scale: Poor Standing balance comment: relies on UE support for balance.                             Cognition Arousal/Alertness: Awake/alert Behavior During Therapy: Flat affect Overall Cognitive Status: Within Functional Limits for tasks assessed                                        Exercises Other Exercises Other Exercises: 10x sit>stand    General Comments General comments (skin integrity, edema, etc.): Pt on 1L O2 via [place] with SaO2 92%O2, mobility on RA SaO2 dropped to 81%O2 Weed replaced and rebounded to 94%O2      Pertinent Vitals/Pain Pain Assessment: 0-10 Pain Score: 3  Pain Location: bilateral LE Pain Descriptors / Indicators: Aching Pain Intervention(s): Limited activity within patient's tolerance;Monitored during session;Repositioned           PT Goals (current goals can now be found in the care plan section) Acute Rehab PT Goals Patient Stated Goal: get stronger PT Goal Formulation: With patient Time For Goal Achievement: 10/28/19 Potential to Achieve Goals: Good Progress towards PT goals: Progressing toward goals    Frequency    Min 2X/week      PT Plan Discharge plan needs to be updated       AM-PAC PT "6 Clicks" Mobility   Outcome Measure  Help needed turning from your back to your side while in a flat bed without using bedrails?: A Little Help needed moving from lying on your back to sitting on the side of a flat bed without using bedrails?: A Little Help needed moving to and from a bed to a chair (including a wheelchair)?: A Lot Help needed standing up from a chair using your arms (e.g., wheelchair or bedside chair)?: A Lot Help needed to walk in hospital room?: Total Help needed climbing 3-5 steps with a railing? : Total 6 Click Score: 12    End of Session Equipment Utilized During Treatment: Gait  belt;Oxygen Activity Tolerance: Patient limited by fatigue Patient left: with call bell/phone within reach;in chair;with chair alarm set Nurse Communication: Mobility status PT Visit Diagnosis: Unsteadiness on feet (R26.81);Muscle weakness (generalized) (M62.81);History of falling (Z91.81)     Time: 1610-9604 PT Time Calculation (min) (ACUTE ONLY): 19 min  Charges:  $Therapeutic Exercise: 8-22 mins                     Paiden Caraveo B. Migdalia Dk PT, DPT Acute Rehabilitation Services Pager [phone] Office [phone]    [place] 10/15/2019, 2:58 PM

## 2019-10-15 NOTE — TOC Progression Note (Addendum)
Transition of Care (TOC) - Progression Note    Patient Details  Name: Christopher Davila MRN: [id number] Date of Birth: 08/25/1970  Transition of Care (TOC) CM/SW Contact  ,  [place], RN Phone Number: 10/15/2019, 2:58 PM  Clinical Narrative:    NCM and CSW met with patient at the bedside, explained to patient that he is not a candidate for CIR  Long term housing and care are a continued issue also he is not a candidate for SNF unles he can pay privately and he states he can not do that,  He states he can go home with HH services which is he active with [facility] for HHPT..         Expected Discharge Plan and Services                                                 Social Determinants of Health (SDOH) Interventions    Readmission Risk Interventions Readmission Risk Prevention Plan 02/19/2019  Transportation Screening Complete  PCP or Specialist Appt within 5-7 Days Complete  Home Care Screening Complete  Medication Review (RN CM) Complete  Some recent data might be hidden   

## 2019-10-15 NOTE — Plan of Care (Signed)
  Problem: Education: Goal: Ability to demonstrate management of disease process will improve Outcome: Progressing   Problem: Education: Goal: Ability to verbalize understanding of medication therapies will improve Outcome: Progressing   Problem: Activity: Goal: Capacity to carry out activities will improve Outcome: Progressing   Problem: Cardiac: Goal: Ability to achieve and maintain adequate cardiopulmonary perfusion will improve Outcome: Progressing   

## 2019-10-15 NOTE — Consult Note (Signed)
[facility] Renal Consultation Note  Requesting MD: Horris Latino Indication for Consultation:  CDK stage IV with volume overload  Chief complaint: diarrhea  HPI:  Christopher Davila is a 49 y.o. male who was admitted to the hospital with severe diarrhea.  He is a previous medical history significant for CKD stage IV, diabetes, hypertension, hyperlipidemia, heart failure.  He presented to the hospital on 4/28 following roughly 1 day of severe diarrhea accompanied by weakness.  He was admitted and given fluid resuscitation and tested for C. difficile.  Ultimately, C. difficile testing returned negative.  On day 2 of his admission, his kidney function has roughly returned to baseline but he remains significantly volume overloaded with a new oxygen requirement.  Christopher Davila reports that he has never seen a kidney doctor and is not aware what has lead to his significant kidney damage.  He acknowledges that it may be related to his diabetes.  Chart review shows that he has had significant decline in his kidney function in the past year has had multiple hospitalizations during which nephrology has been consulted with instructions to follow-up with [facility].  Per chart review, it appears that he underwent a kidney biopsy and 01/2019 which showed diabetic nephropathy with nodular glomerular sclerosis.  Creatinine, Ser  Date/Time Value Ref Range Status  10/15/2019 04:44 AM 4.04 (H) 0.61 - 1.24 mg/dL Final  10/14/2019 03:36 AM 3.64 (H) 0.61 - 1.24 mg/dL Final  10/13/2019 09:57 AM 3.86 (H) 0.61 - 1.24 mg/dL Final  10/13/2019 05:25 AM 4.00 (H) 0.61 - 1.24 mg/dL Final  10/05/2019 01:18 AM 3.94 (H) 0.61 - 1.24 mg/dL Final  09/27/2019 04:04 AM 3.96 (H) 0.61 - 1.24 mg/dL Final  09/26/2019 08:01 AM 3.87 (H) 0.61 - 1.24 mg/dL Final  09/25/2019 08:36 PM 4.09 (H) 0.61 - 1.24 mg/dL Final  09/23/2019 04:18 AM 4.55 (H) 0.61 - 1.24 mg/dL Final  09/22/2019 05:04 AM 4.43 (H) 0.61 - 1.24 mg/dL  Final  09/21/2019 03:50 AM 4.16 (H) 0.61 - 1.24 mg/dL Final  09/20/2019 04:01 AM 3.44 (H) 0.61 - 1.24 mg/dL Final  09/19/2019 11:31 AM 3.34 (H) 0.61 - 1.24 mg/dL Final  07/07/2019 01:21 PM 2.84 (H) 0.61 - 1.24 mg/dL Final  06/17/2019 02:43 AM 3.08 (H) 0.61 - 1.24 mg/dL Final  06/15/2019 04:42 PM 3.07 (H) 0.61 - 1.24 mg/dL Final  04/18/2019 09:33 AM 2.85 (H) 0.61 - 1.24 mg/dL Final  04/17/2019 11:36 AM 2.71 (H) 0.61 - 1.24 mg/dL Final  04/17/2019 06:51 AM 2.71 (H) 0.61 - 1.24 mg/dL Final  04/16/2019 12:55 PM 2.99 (H) 0.61 - 1.24 mg/dL Final  03/22/2019 01:44 PM 2.36 (H) 0.61 - 1.24 mg/dL Final  02/20/2019 07:35 AM 2.14 (H) 0.61 - 1.24 mg/dL Final  02/19/2019 03:53 AM 2.13 (H) 0.61 - 1.24 mg/dL Final  02/18/2019 03:42 AM 2.30 (H) 0.61 - 1.24 mg/dL Final  02/17/2019 03:49 AM 2.19 (H) 0.61 - 1.24 mg/dL Final  02/16/2019 03:57 AM 2.36 (H) 0.61 - 1.24 mg/dL Final  02/15/2019 05:02 AM 2.31 (H) 0.61 - 1.24 mg/dL Final  02/14/2019 03:37 AM 2.32 (H) 0.61 - 1.24 mg/dL Final  02/13/2019 03:09 AM 2.09 (H) 0.61 - 1.24 mg/dL Final  02/12/2019 10:38 PM 2.12 (H) 0.61 - 1.24 mg/dL Final  02/12/2019 11:54 AM 2.33 (H) 0.61 - 1.24 mg/dL Final  06/18/2018 12:54 AM 1.51 (H) 0.61 - 1.24 mg/dL Final  06/15/2018 08:55 AM 1.61 (H) 0.61 - 1.24 mg/dL Final  02/28/2018 01:34 PM 1.24 0.61 - 1.24 mg/dL Final  02/27/2018 12:17 PM 1.65 (H) 0.61 - 1.24 mg/dL Final  12/22/2017 10:49 AM 1.11 0.61 - 1.24 mg/dL Final  05/23/2015 04:07 PM 1.12 0.61 - 1.24 mg/dL Final  12/13/2014 06:08 PM 0.95 0.61 - 1.24 mg/dL Final  09/08/2014 06:38 PM 0.98 0.50 - 1.35 mg/dL Final  09/07/2014 04:56 PM 0.80 0.50 - 1.35 mg/dL Final  07/27/2014 12:10 PM 0.85 0.50 - 1.35 mg/dL Final  03/24/2014 04:32 PM 0.84 0.50 - 1.35 mg/dL Final  02/14/2014 05:40 PM 0.85 0.50 - 1.35 mg/dL Final  02/14/2014 12:55 PM 0.87 0.50 - 1.35 mg/dL Final  01/20/2014 03:21 PM 0.90 0.50 - 1.35 mg/dL Final  01/20/2014 03:15 PM 0.79 0.50 - 1.35 mg/dL Final   10/07/2013 12:31 PM 0.67 0.50 - 1.35 mg/dL Final  04/26/2013 01:05 PM 0.73 0.50 - 1.35 mg/dL Final  03/30/2013 03:34 PM 0.91 0.50 - 1.35 mg/dL Final  03/10/2013 04:01 PM 0.71 0.50 - 1.35 mg/dL Final  12/28/2012 02:05 PM 0.72 0.50 - 1.35 mg/dL Final  09/22/2012 03:36 PM 0.84 0.50 - 1.35 mg/dL Final     PMHx: CKD stage IV secondary to diabetes, diabetes, hypertension, HLD, CHF.   Past Medical History:  Diagnosis Date  . Abdominal pain 12/28/2012  . Acute kidney injury superimposed on chronic kidney disease ([place])   . Blindness 09/04/2019  . Chest pain 02/14/2014  . Diabetes mellitus   . Diarrhea 10/14/2019  . Dyspnea   . Epigastric abdominal pain   . Gastroparesis due to DM ([place])   . Hypertension   . Hypoalbuminemia 04/17/2019  . Hypoxia   . Nausea   . Neuropathy of lower extremity   . Oral candidiasis 06/26/2012  . Pancreatitis 06/26/2012  . Weight loss 06/26/2012  . Wheezing     Past Surgical History:  Procedure Laterality Date  . EYE SURGERY      Family Hx:  Family History  Problem Relation Age of Onset  . Diabetes Mother   . Lung disease Mother   . Hypertension Mother   . Diabetes Maternal Aunt   . CAD Maternal Aunt   . CAD Cousin     Social History:  reports that he quit smoking about 4 years ago. His smoking use included cigarettes. He smoked 0.00 packs per day. He has never used smokeless tobacco. He reports previous alcohol use. He reports that he does not use drugs.  Allergies:  Allergies  Allergen Reactions  . Morphine And Related Itching and Other (See Comments)    Pt prefers not to be given this drug    Medications: Prior to Admission medications   Medication Sig Start Date End Date Taking? Authorizing Provider  acetaminophen (TYLENOL) 500 MG tablet Take 1,000 mg by mouth every 6 (six) hours as needed for mild pain or moderate pain.   Yes [provider]  aspirin EC 81 MG tablet Take 1 tablet (81 mg total) by mouth daily. 05/24/15  Yes  Ghimire, Henreitta Leber, MD  brimonidine (ALPHAGAN P) 0.1 % SOLN Place 1 drop into the left eye 2 (two) times daily.    Yes [provider]  carvedilol (COREG) 25 MG tablet Take 1 tablet (25 mg total) by mouth 2 (two) times daily with a meal. 02/19/19  Yes Gladys Damme, MD  dorzolamide-timolol (COSOPT) 22.3-6.8 MG/ML ophthalmic solution Place 1 drop into the left eye 2 (two) times daily. 09/23/19  Yes Vann, Jessica U, DO  furosemide (LASIX) 40 MG tablet Take 1 tablet (40 mg total) by mouth daily. 09/27/19 12/26/19 Yes  Dessa Phi, DO  gabapentin (NEURONTIN) 300 MG capsule Take 1 capsule (300 mg total) by mouth 2 (two) times daily. Patient taking differently: Take 300 mg by mouth 3 (three) times daily.  09/23/19  Yes Vann, Jessica U, DO  glipiZIDE (GLUCOTROL) 10 MG tablet Take 1 tablet (10 mg total) by mouth 2 (two) times daily. 09/23/19  Yes Eulogio Bear U, DO  hydrALAZINE (APRESOLINE) 25 MG tablet Take 0.5 tablets (12.5 mg total) by mouth 3 (three) times daily. 09/23/19  Yes Vann, Jessica U, DO  latanoprost (XALATAN) 0.005 % ophthalmic solution Place 1 drop into the left eye at bedtime. 09/23/19  Yes Vann, Jessica U, DO  moxifloxacin (VIGAMOX) 0.5 % ophthalmic solution Place 1 drop into the left eye 2 (two) times daily.  09/24/19  Yes [provider]  sodium bicarbonate 650 MG tablet Take 1 tablet (650 mg total) by mouth 3 (three) times daily. 09/23/19  Yes Geradine Girt, DO    I have reviewed the patient's current medications.  Labs:  BMP Latest Ref Rng & Units 10/15/2019 10/14/2019 10/13/2019  Glucose 70 - 99 mg/dL 94 91 155(H)  BUN 6 - 20 mg/dL 63(H) 58(H) 60(H)  Creatinine 0.61 - 1.24 mg/dL 4.04(H) 3.64(H) 3.86(H)  Sodium 135 - 145 mmol/L 137 139 139  Potassium 3.5 - 5.1 mmol/L 4.8 4.8 5.1  Chloride 98 - 111 mmol/L 107 110 109  CO2 22 - 32 mmol/L 21(L) 21(L) 20(L)  Calcium 8.9 - 10.3 mg/dL 8.0(L) 8.6(L) 8.6(L)    Urinalysis    Component Value Date/Time   COLORURINE YELLOW  10/13/2019 1500   APPEARANCEUR CLOUDY (A) 10/13/2019 1500   LABSPEC 1.015 10/13/2019 1500   PHURINE 5.0 10/13/2019 1500   GLUCOSEU 50 (A) 10/13/2019 1500   HGBUR MODERATE (A) 10/13/2019 1500   BILIRUBINUR NEGATIVE 10/13/2019 1500   KETONESUR NEGATIVE 10/13/2019 1500   PROTEINUR >=300 (A) 10/13/2019 1500   UROBILINOGEN 0.2 12/13/2014 2056   NITRITE NEGATIVE 10/13/2019 1500   LEUKOCYTESUR LARGE (A) 10/13/2019 1500     ROS:  Eyes: negative, baseline Ears, nose, mouth, throat, and face: negative Respiratory: positive for SOB Cardiovascular: negative Gastrointestinal: positive for diarrhea Genitourinary:negative Musculoskeletal:positive for weakness of LE bilaterally Neurological: negative Behavioral/Psych: negative Endocrine: negative  Physical Exam: Vitals:   10/15/19 0542 10/15/19 0748  BP: 120/89 131/83  Pulse: 76 77  Resp: 20 17  Temp: (!) 97.4 F (36.3 C) 97.6 F (36.4 C)  SpO2: 100% 98%     General: Obese middle-age man.  Comfortably reclined in his chair trying to rest.  No acute distress. HEENT: Moist mucous membranes, JVD noted to the angle of the jaw. Cardiac: Distant heart sounds.  Normal S1, S2.  No M/R/G appreciated Respiratory: Breathing comfortably on 1 L nasal cannula.  No respiratory distress.  No significant rales or rhonchi noted on auscultation. Stomach: Protuberant stomach.  Soft, nontender to palpation. Extremities: Significant edema noted in lower extremities bilaterally with some swelling of the hands as well.  Skin is warm, dry.  Assessment/Plan:  CKD stage IV: Due to diabetic nephropathy.  Biopsy done 01/2019 demonstrated nodular glomerular sclerosis.  Chart review shows gradually deteriorating kidney function during the past year with a current baseline kidney function between 3.5-4.0.  His kidney function is currently at baseline. Volume overload: Due to concern for dehydration on presentation, he was resuscitated with 2 L IVF followed by  maintenance fluids at 100 cc/h.  He now appears slightly volume overloaded with a minor oxygen requirement.  We will plan to diurese with IV Lasix x1 today and continue to monitor fluid status and kidney function.  Hopefully, his volume overload can be controlled with Lasix and he can be discharged to follow-up with [facility] outpatient to be prepared for dialysis. Metabolic acidosis, chronic: Appears to be a chronic acidosis secondary to his CKD.  We will continue oral bicarb 3 times daily. Urinary retention: Currently has an indwelling Foley still in place from his most recent hospital discharge.  He has been instructed to follow-up with urology. HTN: Home medications included carvedilol, hydralazine and lasix. Currently holding  DM: Likely the reason for his CKD stage IV.  Management per primary team.    Matilde Haymaker 10/15/2019, 4:29 PM

## 2019-10-16 LAB — CBC WITH DIFFERENTIAL/PLATELET
Abs Immature Granulocytes: 0.07 10*3/uL (ref 0.00–0.07)
Basophils Absolute: 0 10*3/uL (ref 0.0–0.1)
Basophils Relative: 0 %
Eosinophils Absolute: 0.2 10*3/uL (ref 0.0–0.5)
Eosinophils Relative: 3 %
HCT: 23.9 % — ABNORMAL LOW (ref 39.0–52.0)
Hemoglobin: 7.4 g/dL — ABNORMAL LOW (ref 13.0–17.0)
Immature Granulocytes: 1 %
Lymphocytes Relative: 17 %
Lymphs Abs: 1.1 10*3/uL (ref 0.7–4.0)
MCH: 27.5 pg (ref 26.0–34.0)
MCHC: 31 g/dL (ref 30.0–36.0)
MCV: 88.8 fL (ref 80.0–100.0)
Monocytes Absolute: 0.9 10*3/uL (ref 0.1–1.0)
Monocytes Relative: 14 %
Neutro Abs: 4.3 10*3/uL (ref 1.7–7.7)
Neutrophils Relative %: 65 %
Platelets: 273 10*3/uL (ref 150–400)
RBC: 2.69 MIL/uL — ABNORMAL LOW (ref 4.22–5.81)
RDW: 14.4 % (ref 11.5–15.5)
WBC: 6.6 10*3/uL (ref 4.0–10.5)
nRBC: 0 % (ref 0.0–0.2)

## 2019-10-16 LAB — IRON AND TIBC
Iron: 44 ug/dL — ABNORMAL LOW (ref 45–182)
Saturation Ratios: 14 % — ABNORMAL LOW (ref 17.9–39.5)
TIBC: 318 ug/dL (ref 250–450)
UIBC: 274 ug/dL

## 2019-10-16 LAB — BASIC METABOLIC PANEL
Anion gap: 11 (ref 5–15)
BUN: 66 mg/dL — ABNORMAL HIGH (ref 6–20)
CO2: 20 mmol/L — ABNORMAL LOW (ref 22–32)
Calcium: 8.3 mg/dL — ABNORMAL LOW (ref 8.9–10.3)
Chloride: 108 mmol/L (ref 98–111)
Creatinine, Ser: 4.01 mg/dL — ABNORMAL HIGH (ref 0.61–1.24)
GFR calc Af Amer: 19 mL/min — ABNORMAL LOW (ref >60)
GFR calc non Af Amer: 17 mL/min — ABNORMAL LOW (ref >60)
Glucose, Bld: 95 mg/dL (ref 70–99)
Potassium: 4.8 mmol/L (ref 3.5–5.1)
Sodium: 139 mmol/L (ref 135–145)

## 2019-10-16 LAB — GLUCOSE, CAPILLARY
Glucose-Capillary: 83 mg/dL (ref 70–99)
Glucose-Capillary: 159 mg/dL — ABNORMAL HIGH (ref 70–99)
Glucose-Capillary: 167 mg/dL — ABNORMAL HIGH (ref 70–99)
Glucose-Capillary: 220 mg/dL — ABNORMAL HIGH (ref 70–99)

## 2019-10-16 LAB — FOLATE: Folate: 6.8 ng/mL (ref >5.9)

## 2019-10-16 LAB — VITAMIN B12: Vitamin B-12: 448 pg/mL (ref 180–914)

## 2019-10-16 LAB — FERRITIN: Ferritin: 80 ng/mL (ref 24–336)

## 2019-10-16 MED ORDER — FUROSEMIDE 10 MG/ML IJ SOLN
80.0000 mg | Freq: Once | INTRAMUSCULAR | Status: AC
  Administered 2019-10-16: 80 mg via INTRAVENOUS
  Filled 2019-10-16: qty 8

## 2019-10-16 MED ORDER — SODIUM CHLORIDE 0.9 % IV SOLN
510.0000 mg | Freq: Once | INTRAVENOUS | Status: AC
  Administered 2019-10-16: 510 mg via INTRAVENOUS
  Filled 2019-10-16: qty 17

## 2019-10-16 MED ORDER — METOLAZONE 5 MG PO TABS
5.0000 mg | ORAL_TABLET | Freq: Once | ORAL | Status: AC
  Administered 2019-10-16: 5 mg via ORAL
  Filled 2019-10-16: qty 1

## 2019-10-16 MED ORDER — SODIUM CHLORIDE 0.9 % IV SOLN
INTRAVENOUS | Status: DC | PRN
  Administered 2019-10-16 – 2019-11-06 (×2): 250 mL via INTRAVENOUS

## 2019-10-16 NOTE — Progress Notes (Signed)
[facility] Progress Note  Name: Christopher Davila MRN: [id number] DOB: 12-10-1970  Chief Complaint:  Diarrhea   Subjective:  he had 825 mL UOP over 4/30.  Got lasix 80 mg IV once yesterday  Review of systems:  States shortness of breath is about the same  No n/v No chest pain ------------ Background on consult:  Christopher Davila is a 49 year old gentleman with a history of diabetes, hypertension, CKD stage IV who presented to the hospital with diarrhea.  Note that he had a recent admission with acute on chronic diastolic CHF.  He was discharged with a foley after his last admission.  Baseline Cr was approximately 3 until January and from 09/19/2019 creatinine has been 3.4 - 4.  Note that he has a history of prior renal biopsy in 02/2019 with diabetic nephropathy with nodular sclerosis and diffuse moderate to severe interstitial fibrosis and tubular atrophy.  There was also ultrastructural evidence of a possible podocytopathy with possible minimal-change disease.  He was seen inpatient by Dr. Carolin Sicks and missed his follow-up appointment with him.  He states that he has a sister who had end-stage renal disease who has unfortunately passed away.  She did not have diabetes or high blood pressure.    Intake/Output Summary (Last 24 hours) at 10/16/2019 0801 Last data filed at 10/16/2019 0343 Gross per 24 hour  Intake 540 ml  Output 825 ml  Net -285 ml    Vitals:  Vitals:   10/15/19 0748 10/15/19 1732 10/15/19 2004 10/16/19 0338  BP: 131/83 (!) 144/88 134/84 (!) 142/88  Pulse: 77 82 81 79  Resp: 17 17 18 20   Temp: 97.6 F (36.4 C) 97.8 F (36.6 C) 97.8 F (36.6 C) 97.7 F (36.5 C)  TempSrc: Oral Oral Oral Oral  SpO2: 98% 94% 94% 98%  Weight:    75.8 kg  Height:         Physical Exam:  General adult male in bed in no acute distress HEENT normocephalic atraumatic decreased visual acuity Neck supple trachea midline Lungs clear to auscultation bilaterally normal work of  breathing at rest  HeartS1S2 no rub Abdomen soft nontender nondistended Extremities 2+ edema  Psych normal mood and affect GU foley in place with urine   Medications reviewed   Labs:  BMP Latest Ref Rng & Units 10/16/2019 10/15/2019 10/14/2019  Glucose 70 - 99 mg/dL 95 94 91  BUN 6 - 20 mg/dL 66(H) 63(H) 58(H)  Creatinine 0.61 - 1.24 mg/dL 4.01(H) 4.04(H) 3.64(H)  Sodium 135 - 145 mmol/L 139 137 139  Potassium 3.5 - 5.1 mmol/L 4.8 4.8 4.8  Chloride 98 - 111 mmol/L 108 107 110  CO2 22 - 32 mmol/L 20(L) 21(L) 21(L)  Calcium 8.9 - 10.3 mg/dL 8.3(L) 8.0(L) 8.6(L)     Assessment/Plan:   # CKD stage IV -Has a significant diabetic nephropathy.  Possible podocytopathy with possible minimal change disease on biopsy however with significant scarring is not a candidate for treatment.  Also noted history of not following up with [facility] - Creatinine may worsen with diuresis  - Our goal is for eventual outpatient follow-up with [facility] to arrange for access placement in anticipation of needing dialysis soon  - please ensure patient is not on more than gabapentin 300 mg daily given his CKD  # Acute on chronic diastolic CHF - metolazone 5 mg PO once now then repeat Lasix 80 mg IV once  - Assess volume status daily   # Urinary retention - Continue Foley  catheter  # Metabolic acidosis.   - Continue bicarbonate at reduced dose for now - lasix IV once now  # Hypertension - Controlled   # Anemia of CKD as well as iron deficiency  - feraheme x 1 on 5/1 - aranesp 40 mcg on 4/30    # Diabetes type 2  - per primary team -Note complication of diabetic retinopathy per patient as well as with CKD   Claudia Desanctis, MD 10/16/2019 8:13 AM

## 2019-10-16 NOTE — Progress Notes (Signed)
PROGRESS NOTE  Deundra Bard DZH:[id number] DOB: Apr 24, 1971 DOA: 10/13/2019 PCP: Marliss Coots, NP  HPI/Recap of past 24 hours: HPI from Dr Lorin Mercy Khang Hannum is a 49 y.o. male with medical history significant of HTN; DM with gastroparesis and blindness; stage 4 CKD; and chronic diastolic CHF presenting with diarrhea and weakness.  He was last admitted from 4/10-12 for acute on chronic diastolic CHF.  He returned to the ER on 4/20 with failure to thrive and weakness, but did not qualify for hospitalization at that time and is not able to be placed in SNF.  He reports that he has been having diarrhea 1 day PTA. He wonders if he ate something that upset his stomach. No nausea or vomiting. No recent antibiotics other than eye drops.  He has been having some problems with weakness in his legs and arms for a few days and it also happened last week. PT came to see him at home last week. In the ED, severe acidosis with gap. Giving Mag, bicarb, LR.  Patient admitted for further management    Today, patient noted to still be short of breath, still with significant fluid overload.  Denies any chest pain, abdominal pain, nausea/vomiting, fever/chills.     Assessment/Plan: Principal Problem:   Diarrhea Active Problems:   Type 2 diabetes mellitus with hypoglycemia (HCC)   Essential hypertension   Hyperlipidemia   CKD (chronic kidney disease) stage 4, GFR 15-29 ml/min (HCC)   Weakness   Metabolic acidosis, increased anion gap   Acute urinary retention  Diarrhea Resolved C. difficile negative, stool panel negative DC IV hydration Supportive care  Acute on chronic diastolic HF Significant bilateral lower extremity edema/scrotal swelling Noted to have progressive peripheral edema BNP 633 Echo on 01/2019 showed EF of 55 to 60%, left ventricular diastolic Doppler parameters are consistent with pseudonormalization Due to CKD, nephrology consulted to assist in fluid management, appreciate  recs Strict I's and O's, daily weights  CKD stage IV/anion gap metabolic acidosis Creatinine around baseline Improved acidosis Nephrology on board Continue bicarb Daily BMP  Anemia of CKD Anemia panel iron 44, sats 14 Status post 1 dose of Feraheme on 10/16/2019 Follow CBC closely  Hypertension BP stable Continue to hold Coreg, continue hydralazine  Diabetes mellitus type 2 Episode of hypoglycemia on 10/13/2019 SSI, Accu-Cheks, hypoglycemic protocol Consider discontinuation of Glucotrol  Generalized weakness/failure to thrive Patient lives with a cousin, who walks and unable to care for him at home PT/OT, recommend SNF (not a candidate unless he can pay privately) TOC consulted-plan to DC home with home health services  Urinary retention Continue indwelling Foley catheter Outpatient follow-up  Blindness Continue eyedrops Awaiting to have surgery        Malnutrition Type:      Malnutrition Characteristics:      Nutrition Interventions:       Estimated body mass index is 29.58 kg/m as calculated from the following:   Height as of this encounter: 5\' 3"  (1.6 m).   Weight as of this encounter: 75.8 kg.     Code Status: Full  Family Communication: Discussed extensively with patient  Disposition Plan: Status is: Inpatient    Dispo: The patient is from: Home              Anticipated d/c is to: Likely DC home with home health services              Anticipated d/c date is: In 2 days  Patient currently is not medically stable to d/c.  Patient requires significant IV diuresis, nephrology sign off    Consultants:  Nephrology  Procedures:  None  Antimicrobials:  None  DVT prophylaxis: Heparin   Objective: Vitals:   10/15/19 2004 10/16/19 0338 10/16/19 0851 10/16/19 1115  BP: 134/84 (!) 142/88 (!) 133/101 (!) 147/88  Pulse: 81 79 85 84  Resp: 18 20 16 18   Temp: 97.8 F (36.6 C) 97.7 F (36.5 C)  97.8 F (36.6 C)   TempSrc: Oral Oral  Oral  SpO2: 94% 98% 94% 96%  Weight:  75.8 kg    Height:        Intake/Output Summary (Last 24 hours) at 10/16/2019 1524 Last data filed at 10/16/2019 1300 Gross per 24 hour  Intake 375.13 ml  Output 1775 ml  Net -1399.87 ml   Filed Weights   10/14/19 0041 10/15/19 0250 10/16/19 0338  Weight: 77.6 kg 77.6 kg 75.8 kg    Exam:  General: NAD, blind  Cardiovascular: S1, S2 present  Respiratory: CTAB  Abdomen: Soft, nontender, nondistended, bowel sounds present  Musculoskeletal: 2+ bilateral pedal edema noted, scrotal swelling  Skin: Normal  Psychiatry: Normal mood    Data Reviewed: CBC: Recent Labs  Lab 10/13/19 0525 10/13/19 0803 10/14/19 0336 10/15/19 0444 10/16/19 0316  WBC 5.3  --  5.4 6.3 6.6  NEUTROABS  --   --   --  4.1 4.3  HGB 7.2* 7.5* 7.8* 7.3* 7.4*  HCT 23.7* 22.0* 25.5* 23.8* 23.9*  MCV 89.1  --  90.1 88.8 88.8  PLT 267  --  276 254 790   Basic Metabolic Panel: Recent Labs  Lab 10/13/19 0525 10/13/19 0525 10/13/19 0803 10/13/19 0957 10/14/19 0336 10/15/19 0444 10/16/19 0316  NA 140   < > 139 139 139 137 139  K 4.5   < > 5.1 5.1 4.8 4.8 4.8  CL 110  --   --  109 110 107 108  CO2 10*  --   --  20* 21* 21* 20*  GLUCOSE 36*  --   --  155* 91 94 95  BUN 63*  --   --  60* 58* 63* 66*  CREATININE 4.00*  --   --  3.86* 3.64* 4.04* 4.01*  CALCIUM 8.8*  --   --  8.6* 8.6* 8.0* 8.3*   < > = values in this interval not displayed.   GFR: Estimated Creatinine Clearance: 20.8 mL/min (A) (by C-G formula based on SCr of 4.01 mg/dL (H)). Liver Function Tests: Recent Labs  Lab 10/13/19 0525  AST 43*  ALT 67*  ALKPHOS 137*  BILITOT 0.4  PROT 6.1*  ALBUMIN 2.5*   Recent Labs  Lab 10/13/19 0525  LIPASE 33   No results for input(s): AMMONIA in the last 168 hours. Coagulation Profile: Recent Labs  Lab 10/13/19 0756  INR 1.1   Cardiac Enzymes: No results for input(s): CKTOTAL, CKMB, CKMBINDEX, TROPONINI in the last  168 hours. BNP (last 3 results) No results for input(s): PROBNP in the last 8760 hours. HbA1C: No results for input(s): HGBA1C in the last 72 hours. CBG: Recent Labs  Lab 10/15/19 1116 10/15/19 1702 10/15/19 2102 10/16/19 0610 10/16/19 1112  GLUCAP 134* 133* 167* 83 159*   Lipid Profile: No results for input(s): CHOL, HDL, LDLCALC, TRIG, CHOLHDL, LDLDIRECT in the last 72 hours. Thyroid Function Tests: Recent Labs    10/14/19 0336  TSH 2.434   Anemia Panel: Recent Labs  10/15/19 1711 10/16/19 0316  VITAMINB12  --  448  FOLATE  --  6.8  FERRITIN 94 80  TIBC 332 318  IRON 73 44*   Urine analysis:    Component Value Date/Time   COLORURINE YELLOW 10/13/2019 1500   APPEARANCEUR CLOUDY (A) 10/13/2019 1500   LABSPEC 1.015 10/13/2019 1500   PHURINE 5.0 10/13/2019 1500   GLUCOSEU 50 (A) 10/13/2019 1500   HGBUR MODERATE (A) 10/13/2019 1500   BILIRUBINUR NEGATIVE 10/13/2019 1500   KETONESUR NEGATIVE 10/13/2019 1500   PROTEINUR >=300 (A) 10/13/2019 1500   UROBILINOGEN 0.2 12/13/2014 2056   NITRITE NEGATIVE 10/13/2019 1500   LEUKOCYTESUR LARGE (A) 10/13/2019 1500   Sepsis Labs: @LABRCNTIP (procalcitonin:4,lacticidven:4)  ) Recent Results (from the past 240 hour(s))  Gastrointestinal Panel by PCR , Stool     Status: None   Collection Time: 10/13/19  7:50 AM   Specimen: Stool  Result Value Ref Range Status   Campylobacter species NOT DETECTED NOT DETECTED Final   Plesimonas shigelloides NOT DETECTED NOT DETECTED Final   Salmonella species NOT DETECTED NOT DETECTED Final   Yersinia enterocolitica NOT DETECTED NOT DETECTED Final   Vibrio species NOT DETECTED NOT DETECTED Final   Vibrio cholerae NOT DETECTED NOT DETECTED Final   Enteroaggregative E coli (EAEC) NOT DETECTED NOT DETECTED Final   Enteropathogenic E coli (EPEC) NOT DETECTED NOT DETECTED Final   Enterotoxigenic E coli (ETEC) NOT DETECTED NOT DETECTED Final   Shiga like toxin producing E coli (STEC) NOT  DETECTED NOT DETECTED Final   Shigella/Enteroinvasive E coli (EIEC) NOT DETECTED NOT DETECTED Final   Cryptosporidium NOT DETECTED NOT DETECTED Final   Cyclospora cayetanensis NOT DETECTED NOT DETECTED Final   Entamoeba histolytica NOT DETECTED NOT DETECTED Final   Giardia lamblia NOT DETECTED NOT DETECTED Final   Adenovirus F40/41 NOT DETECTED NOT DETECTED Final   Astrovirus NOT DETECTED NOT DETECTED Final   Norovirus GI/GII NOT DETECTED NOT DETECTED Final   Rotavirus A NOT DETECTED NOT DETECTED Final   Sapovirus (I, II, IV, and V) NOT DETECTED NOT DETECTED Final    Comment: Performed at [facility], [address] [place], [place] [address]  C Difficile Quick Screen w PCR reflex     Status: None   Collection Time: 10/13/19  7:50 AM   Specimen: Stool  Result Value Ref Range Status   C Diff antigen NEGATIVE NEGATIVE Final   C Diff toxin NEGATIVE NEGATIVE Final   C Diff interpretation No C. difficile detected.  Final    Comment: Performed at [facility], [place] [address], [place],  [address]  Respiratory Panel by RT PCR (Flu A&B, Covid) - Nasopharyngeal Swab     Status: None   Collection Time: 10/13/19 10:19 AM   Specimen: Nasopharyngeal Swab  Result Value Ref Range Status   SARS Coronavirus 2 by RT PCR NEGATIVE NEGATIVE Final    Comment: (NOTE) SARS-CoV-2 target nucleic acids are NOT DETECTED. The SARS-CoV-2 RNA is generally detectable in upper respiratoy specimens during the acute phase of infection. The lowest concentration of SARS-CoV-2 viral copies this assay can detect is 131 copies/mL. A negative result does not preclude SARS-Cov-2 infection and should not be used as the sole basis for treatment or other patient management decisions. A negative result may occur with  improper specimen collection/handling, submission of specimen other than nasopharyngeal swab, presence of viral mutation(s) within the areas targeted by this assay, and inadequate number  of viral copies (<131 copies/mL). A negative  result must be combined with clinical observations, patient history, and epidemiological information. The expected result is Negative. Fact Sheet for Patients:  PinkCheek.be Fact Sheet for Healthcare Providers:  GravelBags.it This test is not yet ap proved or cleared by the [place] FDA and  has been authorized for detection and/or diagnosis of SARS-CoV-2 by FDA under an Emergency Use Authorization (EUA). This EUA will remain  in effect (meaning this test can be used) for the duration of the COVID-19 declaration under Section 564(b)(1) of the Act, 21 U.S.C. section 360bbb-3(b)(1), unless the authorization is terminated or revoked sooner.    Influenza A by PCR NEGATIVE NEGATIVE Final   Influenza B by PCR NEGATIVE NEGATIVE Final    Comment: (NOTE) The Xpert Xpress SARS-CoV-2/FLU/RSV assay is intended as an aid in  the diagnosis of influenza from Nasopharyngeal swab specimens and  should not be used as a sole basis for treatment. Nasal washings and  aspirates are unacceptable for Xpert Xpress SARS-CoV-2/FLU/RSV  testing. Fact Sheet for Patients: PinkCheek.be Fact Sheet for Healthcare Providers: GravelBags.it This test is not yet approved or cleared by the [place] FDA and  has been authorized for detection and/or diagnosis of SARS-CoV-2 by  FDA under an Emergency Use Authorization (EUA). This EUA will remain  in effect (meaning this test can be used) for the duration of the  Covid-19 declaration under Section 564(b)(1) of the Act, 21  U.S.C. section 360bbb-3(b)(1), unless the authorization is  terminated or revoked. Performed at [facility], [place] [address], [place], [place] [address]       Studies: No results found.  Scheduled Meds: . aspirin EC  81 mg Oral Daily  . brimonidine  1 drop Left Eye BID   . Chlorhexidine Gluconate Cloth  6 each Topical Daily  . dorzolamide-timolol  1 drop Left Eye BID  . gabapentin  100 mg Oral TID  . gatifloxacin  1 drop Left Eye QID  . heparin  5,000 Units Subcutaneous Q8H  . hydrALAZINE  12.5 mg Oral TID  . insulin aspart  0-9 Units Subcutaneous TID WC  . latanoprost  1 drop Left Eye QHS  . sodium bicarbonate  650 mg Oral Daily    Continuous Infusions: . sodium chloride 250 mL (10/16/19 0909)     LOS: 1 day     Alma Friendly, MD [facility]  If 7PM-7AM, please contact night-coverage [email or web address] 10/16/2019, 3:24 PM

## 2019-10-17 LAB — BASIC METABOLIC PANEL
Anion gap: 10 (ref 5–15)
BUN: 62 mg/dL — ABNORMAL HIGH (ref 6–20)
CO2: 22 mmol/L (ref 22–32)
Calcium: 8.9 mg/dL (ref 8.9–10.3)
Chloride: 107 mmol/L (ref 98–111)
Creatinine, Ser: 3.89 mg/dL — ABNORMAL HIGH (ref 0.61–1.24)
GFR calc Af Amer: 20 mL/min — ABNORMAL LOW (ref >60)
GFR calc non Af Amer: 17 mL/min — ABNORMAL LOW (ref >60)
Glucose, Bld: 163 mg/dL — ABNORMAL HIGH (ref 70–99)
Potassium: 4.2 mmol/L (ref 3.5–5.1)
Sodium: 139 mmol/L (ref 135–145)

## 2019-10-17 LAB — CBC WITH DIFFERENTIAL/PLATELET
Abs Immature Granulocytes: 0.08 10*3/uL — ABNORMAL HIGH (ref 0.00–0.07)
Basophils Absolute: 0 10*3/uL (ref 0.0–0.1)
Basophils Relative: 0 %
Eosinophils Absolute: 0.2 10*3/uL (ref 0.0–0.5)
Eosinophils Relative: 3 %
HCT: 25.3 % — ABNORMAL LOW (ref 39.0–52.0)
Hemoglobin: 7.8 g/dL — ABNORMAL LOW (ref 13.0–17.0)
Immature Granulocytes: 1 %
Lymphocytes Relative: 13 %
Lymphs Abs: 0.9 10*3/uL (ref 0.7–4.0)
MCH: 27.4 pg (ref 26.0–34.0)
MCHC: 30.8 g/dL (ref 30.0–36.0)
MCV: 88.8 fL (ref 80.0–100.0)
Monocytes Absolute: 0.8 10*3/uL (ref 0.1–1.0)
Monocytes Relative: 11 %
Neutro Abs: 5 10*3/uL (ref 1.7–7.7)
Neutrophils Relative %: 72 %
Platelets: 270 10*3/uL (ref 150–400)
RBC: 2.85 MIL/uL — ABNORMAL LOW (ref 4.22–5.81)
RDW: 14.4 % (ref 11.5–15.5)
WBC: 7.1 10*3/uL (ref 4.0–10.5)
nRBC: 0 % (ref 0.0–0.2)

## 2019-10-17 LAB — GLUCOSE, CAPILLARY
Glucose-Capillary: 136 mg/dL — ABNORMAL HIGH (ref 70–99)
Glucose-Capillary: 207 mg/dL — ABNORMAL HIGH (ref 70–99)
Glucose-Capillary: 156 mg/dL — ABNORMAL HIGH (ref 70–99)
Glucose-Capillary: 119 mg/dL — ABNORMAL HIGH (ref 70–99)

## 2019-10-17 MED ORDER — FUROSEMIDE 10 MG/ML IJ SOLN
80.0000 mg | Freq: Once | INTRAMUSCULAR | Status: AC
  Administered 2019-10-17: 10:00:00 80 mg via INTRAVENOUS
  Filled 2019-10-17: qty 8

## 2019-10-17 MED ORDER — METOLAZONE 5 MG PO TABS
5.0000 mg | ORAL_TABLET | Freq: Once | ORAL | Status: AC
  Administered 2019-10-17: 5 mg via ORAL
  Filled 2019-10-17: qty 1

## 2019-10-17 MED ORDER — CARVEDILOL 12.5 MG PO TABS
12.5000 mg | ORAL_TABLET | Freq: Two times a day (BID) | ORAL | Status: DC
  Administered 2019-10-17 – 2019-10-18 (×2): 12.5 mg via ORAL
  Filled 2019-10-17 (×2): qty 1

## 2019-10-17 MED ORDER — SENNOSIDES-DOCUSATE SODIUM 8.6-50 MG PO TABS
1.0000 | ORAL_TABLET | Freq: Every day | ORAL | Status: DC
  Administered 2019-10-17: 1 via ORAL
  Filled 2019-10-17: qty 1

## 2019-10-17 NOTE — Progress Notes (Signed)
Received referral to assist pt with a 3-in-1 BSC and a shower chair. Contacted Keon at [employer] for DME charity referral. Met with pt at bedside. Informed him that the DME has been ordered. Informed him that he may be able to use the 3-in-1 BSC as as shower chair if the agency is not able to provide the shower chair. He verbalized understanding.

## 2019-10-17 NOTE — Progress Notes (Signed)
Pharmacist Heart Failure Core Measure Documentation  Assessment: Christopher Davila has an EF documented as 55-50% on 01/2019 by ECHO.  Rationale: Heart failure patients with left ventricular systolic dysfunction (LVSD) and an EF < 40% should be prescribed an angiotensin converting enzyme inhibitor (ACEI) or angiotensin receptor blocker (ARB) at discharge unless a contraindication is documented in the medical record.  This patient is not currently on an ACEI or ARB for HF.  This note is being placed in the record in order to provide documentation that a contraindication to the use of these agents is present for this encounter.  ACE Inhibitor or Angiotensin Receptor Blocker is contraindicated (specify all that apply)  []   ACEI allergy AND ARB allergy []   Angioedema []   Moderate or severe aortic stenosis []   Hyperkalemia []   Hypotension []   Renal artery stenosis [x]   Worsening renal function, preexisting renal disease or dysfunction   Christopher Davila 10/17/2019 9:34 AM

## 2019-10-17 NOTE — Progress Notes (Signed)
PROGRESS NOTE  Christopher Davila ZRA:[id number] DOB: 1970-10-11 DOA: 10/13/2019 PCP: Marliss Coots, NP  HPI/Recap of past 24 hours: HPI from Dr Lorin Mercy Christopher Davila is a 49 y.o. male with medical history significant of HTN; DM with gastroparesis and blindness; stage 4 CKD; and chronic diastolic CHF presenting with diarrhea and weakness.  He was last admitted from 4/10-12 for acute on chronic diastolic CHF.  He returned to the ER on 4/20 with failure to thrive and weakness, but did not qualify for hospitalization at that time and is not able to be placed in SNF.  He reports that he has been having diarrhea 1 day PTA. He wonders if he ate something that upset his stomach. No nausea or vomiting. No recent antibiotics other than eye drops.  He has been having some problems with weakness in his legs and arms for a few days and it also happened last week. PT came to see him at home last week. In the ED, severe acidosis with gap. Giving Mag, bicarb, LR.  Patient admitted for further management    Today, patient still with some subjective shortness of breath, still with significant bilateral lower extremity edema, some sacral/scrotal edema, denies any chest pain, abdominal pain, nausea/vomiting, fever/chills.     Assessment/Plan: Principal Problem:   Diarrhea Active Problems:   Type 2 diabetes mellitus with hypoglycemia (HCC)   Essential hypertension   Hyperlipidemia   CKD (chronic kidney disease) stage 4, GFR 15-29 ml/min (HCC)   Weakness   Metabolic acidosis, increased anion gap   Acute urinary retention  Diarrhea Resolved C. difficile negative, stool panel negative DC IV hydration Supportive care  Acute on chronic diastolic HF Significant bilateral lower extremity edema/scrotal swelling Noted to have progressive peripheral edema BNP 633 Echo on 01/2019 showed EF of 55 to 60%, left ventricular diastolic Doppler parameters are consistent with pseudonormalization Due to CKD, nephrology  consulted to assist in fluid management, appreciate recs Strict I's and O's, daily weights  CKD stage IV/anion gap metabolic acidosis Creatinine around baseline Improved acidosis Nephrology on board Continue bicarb Daily BMP  Anemia of CKD Anemia panel iron 44, sats 14 Status post 1 dose of Feraheme on 10/16/2019 Follow CBC closely  Hypertension BP stable Continue hydralazine, restart Coreg at a lower dose  Diabetes mellitus type 2 Episode of hypoglycemia on 10/13/2019 SSI, Accu-Cheks, hypoglycemic protocol Consider discontinuation of Glucotrol  Generalized weakness/failure to thrive Patient lives with a cousin, who works and unable to care for him at home PT/OT, recommend SNF (not a candidate unless he can pay privately) TOC consulted-plan to DC home with home health services  Urinary retention Continue indwelling Foley catheter Outpatient follow-up  Blindness Continue eyedrops Awaiting to have surgery        Malnutrition Type:      Malnutrition Characteristics:      Nutrition Interventions:       Estimated body mass index is 31.18 kg/m as calculated from the following:   Height as of this encounter: 5\' 3"  (1.6 m).   Weight as of this encounter: 79.8 kg.     Code Status: Full  Family Communication: Discussed extensively with patient  Disposition Plan: Status is: Inpatient    Dispo: The patient is from: Home              Anticipated d/c is to: Likely DC home with home health services              Anticipated d/c date is: In 2  days              Patient currently is not medically stable to d/c.  Patient requires significant IV diuresis, nephrology sign off    Consultants:  Nephrology  Procedures:  None  Antimicrobials:  None  DVT prophylaxis: Heparin   Objective: Vitals:   10/16/19 1941 10/17/19 0501 10/17/19 0937 10/17/19 0938  BP: (!) 156/88 (!) 155/95  (!) 161/97  Pulse: 85 82  81  Resp: 19 17  18   Temp: 97.8 F (36.6  C) 97.6 F (36.4 C) 98.6 F (37 C)   TempSrc: Oral Oral Oral   SpO2: 96% 98%  100%  Weight:  79.8 kg    Height:        Intake/Output Summary (Last 24 hours) at 10/17/2019 1609 Last data filed at 10/17/2019 1500 Gross per 24 hour  Intake 920 ml  Output 2750 ml  Net -1830 ml   Filed Weights   10/15/19 0250 10/16/19 0338 10/17/19 0501  Weight: 77.6 kg 75.8 kg 79.8 kg    Exam:  General: NAD, blind  Cardiovascular: S1, S2 present  Respiratory: CTAB  Abdomen: Soft, nontender, nondistended, bowel sounds present  Musculoskeletal: 2+ bilateral pedal edema noted, scrotal swelling  Skin: Normal  Psychiatry: Normal mood    Data Reviewed: CBC: Recent Labs  Lab 10/13/19 0525 10/13/19 0525 10/13/19 0803 10/14/19 0336 10/15/19 0444 10/16/19 0316 10/17/19 0524  WBC 5.3  --   --  5.4 6.3 6.6 7.1  NEUTROABS  --   --   --   --  4.1 4.3 5.0  HGB 7.2*   < > 7.5* 7.8* 7.3* 7.4* 7.8*  HCT 23.7*   < > 22.0* 25.5* 23.8* 23.9* 25.3*  MCV 89.1  --   --  90.1 88.8 88.8 88.8  PLT 267  --   --  276 254 273 270   < > = values in this interval not displayed.   Basic Metabolic Panel: Recent Labs  Lab 10/13/19 0957 10/14/19 0336 10/15/19 0444 10/16/19 0316 10/17/19 0524  NA 139 139 137 139 139  K 5.1 4.8 4.8 4.8 4.2  CL 109 110 107 108 107  CO2 20* 21* 21* 20* 22  GLUCOSE 155* 91 94 95 163*  BUN 60* 58* 63* 66* 62*  CREATININE 3.86* 3.64* 4.04* 4.01* 3.89*  CALCIUM 8.6* 8.6* 8.0* 8.3* 8.9   GFR: Estimated Creatinine Clearance: 21.9 mL/min (A) (by C-G formula based on SCr of 3.89 mg/dL (H)). Liver Function Tests: Recent Labs  Lab 10/13/19 0525  AST 43*  ALT 67*  ALKPHOS 137*  BILITOT 0.4  PROT 6.1*  ALBUMIN 2.5*   Recent Labs  Lab 10/13/19 0525  LIPASE 33   No results for input(s): AMMONIA in the last 168 hours. Coagulation Profile: Recent Labs  Lab 10/13/19 0756  INR 1.1   Cardiac Enzymes: No results for input(s): CKTOTAL, CKMB, CKMBINDEX, TROPONINI in  the last 168 hours. BNP (last 3 results) No results for input(s): PROBNP in the last 8760 hours. HbA1C: No results for input(s): HGBA1C in the last 72 hours. CBG: Recent Labs  Lab 10/16/19 1112 10/16/19 1629 10/16/19 2103 10/17/19 0616 10/17/19 1232  GLUCAP 159* 167* 220* 136* 207*   Lipid Profile: No results for input(s): CHOL, HDL, LDLCALC, TRIG, CHOLHDL, LDLDIRECT in the last 72 hours. Thyroid Function Tests: No results for input(s): TSH, T4TOTAL, FREET4, T3FREE, THYROIDAB in the last 72 hours. Anemia Panel: Recent Labs    10/15/19 1711 10/16/19 0316  VITAMINB12  --  448  FOLATE  --  6.8  FERRITIN 94 80  TIBC 332 318  IRON 73 44*   Urine analysis:    Component Value Date/Time   COLORURINE YELLOW 10/13/2019 1500   APPEARANCEUR CLOUDY (A) 10/13/2019 1500   LABSPEC 1.015 10/13/2019 1500   PHURINE 5.0 10/13/2019 1500   GLUCOSEU 50 (A) 10/13/2019 1500   HGBUR MODERATE (A) 10/13/2019 1500   BILIRUBINUR NEGATIVE 10/13/2019 1500   KETONESUR NEGATIVE 10/13/2019 1500   PROTEINUR >=300 (A) 10/13/2019 1500   UROBILINOGEN 0.2 12/13/2014 2056   NITRITE NEGATIVE 10/13/2019 1500   LEUKOCYTESUR LARGE (A) 10/13/2019 1500   Sepsis Labs: @LABRCNTIP (procalcitonin:4,lacticidven:4)  ) Recent Results (from the past 240 hour(s))  Gastrointestinal Panel by PCR , Stool     Status: None   Collection Time: 10/13/19  7:50 AM   Specimen: Stool  Result Value Ref Range Status   Campylobacter species NOT DETECTED NOT DETECTED Final   Plesimonas shigelloides NOT DETECTED NOT DETECTED Final   Salmonella species NOT DETECTED NOT DETECTED Final   Yersinia enterocolitica NOT DETECTED NOT DETECTED Final   Vibrio species NOT DETECTED NOT DETECTED Final   Vibrio cholerae NOT DETECTED NOT DETECTED Final   Enteroaggregative E coli (EAEC) NOT DETECTED NOT DETECTED Final   Enteropathogenic E coli (EPEC) NOT DETECTED NOT DETECTED Final   Enterotoxigenic E coli (ETEC) NOT DETECTED NOT DETECTED  Final   Shiga like toxin producing E coli (STEC) NOT DETECTED NOT DETECTED Final   Shigella/Enteroinvasive E coli (EIEC) NOT DETECTED NOT DETECTED Final   Cryptosporidium NOT DETECTED NOT DETECTED Final   Cyclospora cayetanensis NOT DETECTED NOT DETECTED Final   Entamoeba histolytica NOT DETECTED NOT DETECTED Final   Giardia lamblia NOT DETECTED NOT DETECTED Final   Adenovirus F40/41 NOT DETECTED NOT DETECTED Final   Astrovirus NOT DETECTED NOT DETECTED Final   Norovirus GI/GII NOT DETECTED NOT DETECTED Final   Rotavirus A NOT DETECTED NOT DETECTED Final   Sapovirus (I, II, IV, and V) NOT DETECTED NOT DETECTED Final    Comment: Performed at [facility], [address] [place], [place] [address]  C Difficile Quick Screen w PCR reflex     Status: None   Collection Time: 10/13/19  7:50 AM   Specimen: Stool  Result Value Ref Range Status   C Diff antigen NEGATIVE NEGATIVE Final   C Diff toxin NEGATIVE NEGATIVE Final   C Diff interpretation No C. difficile detected.  Final    Comment: Performed at [facility], [place] [address], [place], [place] [address]  Respiratory Panel by RT PCR (Flu A&B, Covid) - Nasopharyngeal Swab     Status: None   Collection Time: 10/13/19 10:19 AM   Specimen: Nasopharyngeal Swab  Result Value Ref Range Status   SARS Coronavirus 2 by RT PCR NEGATIVE NEGATIVE Final    Comment: (NOTE) SARS-CoV-2 target nucleic acids are NOT DETECTED. The SARS-CoV-2 RNA is generally detectable in upper respiratoy specimens during the acute phase of infection. The lowest concentration of SARS-CoV-2 viral copies this assay can detect is 131 copies/mL. A negative result does not preclude SARS-Cov-2 infection and should not be used as the sole basis for treatment or other patient management decisions. A negative result may occur with  improper specimen collection/handling, submission of specimen other than nasopharyngeal swab, presence of viral mutation(s) within  the areas targeted by this assay, and inadequate number of viral copies (<131 copies/mL). A negative result must be combined with  clinical observations, patient history, and epidemiological information. The expected result is Negative. Fact Sheet for Patients:  PinkCheek.be Fact Sheet for Healthcare Providers:  GravelBags.it This test is not yet ap proved or cleared by the [place] FDA and  has been authorized for detection and/or diagnosis of SARS-CoV-2 by FDA under an Emergency Use Authorization (EUA). This EUA will remain  in effect (meaning this test can be used) for the duration of the COVID-19 declaration under Section 564(b)(1) of the Act, 21 U.S.C. section 360bbb-3(b)(1), unless the authorization is terminated or revoked sooner.    Influenza A by PCR NEGATIVE NEGATIVE Final   Influenza B by PCR NEGATIVE NEGATIVE Final    Comment: (NOTE) The Xpert Xpress SARS-CoV-2/FLU/RSV assay is intended as an aid in  the diagnosis of influenza from Nasopharyngeal swab specimens and  should not be used as a sole basis for treatment. Nasal washings and  aspirates are unacceptable for Xpert Xpress SARS-CoV-2/FLU/RSV  testing. Fact Sheet for Patients: PinkCheek.be Fact Sheet for Healthcare Providers: GravelBags.it This test is not yet approved or cleared by the [place] FDA and  has been authorized for detection and/or diagnosis of SARS-CoV-2 by  FDA under an Emergency Use Authorization (EUA). This EUA will remain  in effect (meaning this test can be used) for the duration of the  Covid-19 declaration under Section 564(b)(1) of the Act, 21  U.S.C. section 360bbb-3(b)(1), unless the authorization is  terminated or revoked. Performed at [facility], [place] [address] [place], [place] [address]       Studies: No results found.  Scheduled Meds: . aspirin EC   81 mg Oral Daily  . brimonidine  1 drop Left Eye BID  . Chlorhexidine Gluconate Cloth  6 each Topical Daily  . dorzolamide-timolol  1 drop Left Eye BID  . gabapentin  100 mg Oral TID  . gatifloxacin  1 drop Left Eye QID  . heparin  5,000 Units Subcutaneous Q8H  . hydrALAZINE  12.5 mg Oral TID  . insulin aspart  0-9 Units Subcutaneous TID WC  . latanoprost  1 drop Left Eye QHS  . sodium bicarbonate  650 mg Oral Daily    Continuous Infusions: . sodium chloride 250 mL (10/16/19 0909)     LOS: 2 days     Alma Friendly, MD [facility]  If 7PM-7AM, please contact night-coverage [email or web address] 10/17/2019, 4:09 PM

## 2019-10-17 NOTE — Plan of Care (Signed)
  Problem: Pain Managment: Goal: General experience of comfort will improve Outcome: Progressing   Problem: Safety: Goal: Ability to remain free from injury will improve Outcome: Progressing   

## 2019-10-17 NOTE — Progress Notes (Signed)
[facility] Progress Note  Name: Christopher Davila MRN: [id number] DOB: 07/04/1970  Chief Complaint:  Diarrhea   Subjective:  he had 2.3 L UOP over 5/1.  Got lasix 80 mg IV once yesterday as well as metolazone 5 mg once.  Feels like his abdomen is less swollen   Review of systems:  States shortness of breath is about the same ; has been on 1 liter oxygen No n/v No chest pain ------------ Background on consult:  [name] is a 49 year old gentleman with a history of diabetes, hypertension, CKD stage IV who presented to the hospital with diarrhea.  Note that he had a recent admission with acute on chronic diastolic CHF.  He was discharged with a foley after his last admission.  Baseline Cr was approximately 3 until January and from 09/19/2019 creatinine has been 3.4 - 4.  Note that he has a history of prior renal biopsy in 02/2019 with diabetic nephropathy with nodular sclerosis and diffuse moderate to severe interstitial fibrosis and tubular atrophy.  There was also ultrastructural evidence of a possible podocytopathy with possible minimal-change disease.  He was seen inpatient by Dr. Carolin Davila and missed his follow-up appointment with him.  He states that he has a sister who had end-stage renal disease who has unfortunately passed away.  She did not have diabetes or high blood pressure.    Intake/Output Summary (Last 24 hours) at 10/17/2019 0902 Last data filed at 10/17/2019 0335 Gross per 24 hour  Intake 595.13 ml  Output 2300 ml  Net -1704.87 ml    Vitals:  Vitals:   10/16/19 1115 10/16/19 1705 10/16/19 1941 10/17/19 0501  BP: (!) 147/88 (!) 152/91 (!) 156/88 (!) 155/95  Pulse: 84 88 85 82  Resp: 18 14 19 17   Temp: 97.8 F (36.6 C)  97.8 F (36.6 C) 97.6 F (36.4 C)  TempSrc: Oral  Oral Oral  SpO2: 96% 91% 96% 98%  Weight:    79.8 kg  Height:         Physical Exam:   General adult male in bed in no acute distress HEENT normocephalic atraumatic decreased visual  acuity Neck supple trachea midline Lungs basilar crackles normal work of breathing at rest  HeartS1S2 no rub; scrotal edema  Abdomen soft nontender nondistended Extremities 2+ edema with legs elevated; pitting edema of thighs  Psych normal mood and affect GU foley in place with urine   Medications reviewed   Labs:  BMP Latest Ref Rng & Units 10/17/2019 10/16/2019 10/15/2019  Glucose 70 - 99 mg/dL 163(H) 95 94  BUN 6 - 20 mg/dL 62(H) 66(H) 63(H)  Creatinine 0.61 - 1.24 mg/dL 3.89(H) 4.01(H) 4.04(H)  Sodium 135 - 145 mmol/L 139 139 137  Potassium 3.5 - 5.1 mmol/L 4.2 4.8 4.8  Chloride 98 - 111 mmol/L 107 108 107  CO2 22 - 32 mmol/L 22 20(L) 21(L)  Calcium 8.9 - 10.3 mg/dL 8.9 8.3(L) 8.0(L)     Assessment/Plan:   # CKD stage IV -Has a significant diabetic nephropathy.  Possible podocytopathy with possible minimal change disease on biopsy however with significant scarring is not a candidate for treatment.  Also noted history of not following up with [facility] - stable and tolerating diuresis  - recheck labs in AM to include phos  - Our goal is for eventual outpatient follow-up with [facility] to arrange for access placement in anticipation of needing dialysis soon. - please ensure patient is not on more than gabapentin 300 mg daily given  his CKD  # Acute on chronic diastolic CHF - metolazone 5 mg PO once now then repeat Lasix 80 mg IV once   - Assess volume status daily  - on 1.2 liter/day fluid restriction   # Urinary retention - Continue Foley catheter - was discharged with a foley after last admission   # Metabolic acidosis   - Continue bicarbonate at reduced dose for now  # Hypertension - with overload  - diuretics as above   # Anemia of CKD as well as iron deficiency - PRBC's per primary team   - feraheme x 1 on 5/1 - aranesp 40 mcg on 4/30    # Diabetes type 2  - per primary team -Note complication of diabetic retinopathy per patient as well as with  CKD  Disposition - remains on IV diuretics.  Hopeful for discharge soon once volume more optimized then will need close follow-up with [facility] for CKD care and dialysis planning.  Previously saw Dr. Carolin Davila inpatient and missed his outpatient follow-up   Claudia Desanctis, MD 10/17/2019 9:19 AM

## 2019-10-18 LAB — CBC WITH DIFFERENTIAL/PLATELET
Abs Immature Granulocytes: 0.09 10*3/uL — ABNORMAL HIGH (ref 0.00–0.07)
Basophils Absolute: 0 10*3/uL (ref 0.0–0.1)
Basophils Relative: 0 %
Eosinophils Absolute: 0.2 10*3/uL (ref 0.0–0.5)
Eosinophils Relative: 3 %
HCT: 25.4 % — ABNORMAL LOW (ref 39.0–52.0)
Hemoglobin: 7.9 g/dL — ABNORMAL LOW (ref 13.0–17.0)
Immature Granulocytes: 1 %
Lymphocytes Relative: 10 %
Lymphs Abs: 0.8 10*3/uL (ref 0.7–4.0)
MCH: 27.7 pg (ref 26.0–34.0)
MCHC: 31.1 g/dL (ref 30.0–36.0)
MCV: 89.1 fL (ref 80.0–100.0)
Monocytes Absolute: 0.7 10*3/uL (ref 0.1–1.0)
Monocytes Relative: 9 %
Neutro Abs: 6 10*3/uL (ref 1.7–7.7)
Neutrophils Relative %: 77 %
Platelets: 256 10*3/uL (ref 150–400)
RBC: 2.85 MIL/uL — ABNORMAL LOW (ref 4.22–5.81)
RDW: 14.1 % (ref 11.5–15.5)
WBC: 7.9 10*3/uL (ref 4.0–10.5)
nRBC: 0 % (ref 0.0–0.2)

## 2019-10-18 LAB — BASIC METABOLIC PANEL
Anion gap: 10 (ref 5–15)
BUN: 58 mg/dL — ABNORMAL HIGH (ref 6–20)
CO2: 23 mmol/L (ref 22–32)
Calcium: 9.1 mg/dL (ref 8.9–10.3)
Chloride: 107 mmol/L (ref 98–111)
Creatinine, Ser: 3.69 mg/dL — ABNORMAL HIGH (ref 0.61–1.24)
GFR calc Af Amer: 21 mL/min — ABNORMAL LOW (ref >60)
GFR calc non Af Amer: 18 mL/min — ABNORMAL LOW (ref >60)
Glucose, Bld: 113 mg/dL — ABNORMAL HIGH (ref 70–99)
Potassium: 4.1 mmol/L (ref 3.5–5.1)
Sodium: 140 mmol/L (ref 135–145)

## 2019-10-18 LAB — GLUCOSE, CAPILLARY
Glucose-Capillary: 208 mg/dL — ABNORMAL HIGH (ref 70–99)
Glucose-Capillary: 123 mg/dL — ABNORMAL HIGH (ref 70–99)
Glucose-Capillary: 162 mg/dL — ABNORMAL HIGH (ref 70–99)
Glucose-Capillary: 213 mg/dL — ABNORMAL HIGH (ref 70–99)
Glucose-Capillary: 210 mg/dL — ABNORMAL HIGH (ref 70–99)

## 2019-10-18 LAB — PHOSPHORUS: Phosphorus: 6.5 mg/dL — ABNORMAL HIGH (ref 2.5–4.6)

## 2019-10-18 MED ORDER — FUROSEMIDE 80 MG PO TABS
80.0000 mg | ORAL_TABLET | Freq: Every day | ORAL | Status: DC
  Administered 2019-10-18 – 2019-10-19 (×2): 80 mg via ORAL
  Filled 2019-10-18 (×2): qty 1

## 2019-10-18 MED ORDER — SENNOSIDES-DOCUSATE SODIUM 8.6-50 MG PO TABS
1.0000 | ORAL_TABLET | Freq: Two times a day (BID) | ORAL | Status: DC
  Administered 2019-10-18 – 2019-11-19 (×39): 1 via ORAL
  Filled 2019-10-18 (×53): qty 1

## 2019-10-18 MED ORDER — CARVEDILOL 25 MG PO TABS
25.0000 mg | ORAL_TABLET | Freq: Two times a day (BID) | ORAL | Status: DC
  Administered 2019-10-18 – 2019-10-26 (×16): 25 mg via ORAL
  Filled 2019-10-18 (×16): qty 1

## 2019-10-18 MED ORDER — HYDRALAZINE HCL 20 MG/ML IJ SOLN
10.0000 mg | Freq: Once | INTRAMUSCULAR | Status: AC
  Administered 2019-10-18: 19:00:00 10 mg via INTRAVENOUS
  Filled 2019-10-18: qty 1

## 2019-10-18 MED ORDER — HYDRALAZINE HCL 25 MG PO TABS
25.0000 mg | ORAL_TABLET | Freq: Three times a day (TID) | ORAL | Status: DC
  Administered 2019-10-18 – 2019-11-01 (×34): 25 mg via ORAL
  Filled 2019-10-18 (×37): qty 1

## 2019-10-18 NOTE — TOC Progression Note (Addendum)
Transition of Care [facility]) - Progression Note    Patient Details  Name: Christopher Davila MRN: [id number] Date of Birth: 02-Sep-1970  [facility]) CM/SW Contact  Christopher Mayo, RN Phone Number: 10/18/2019, 2:32 PM  Clinical Narrative:    NCM lvm for Christopher Davila with financial counseling to return call regarding some medicaid ast for patient.  Awaiting call back. NCM called Christopher Davila again, she states she will have somone to work on patient.  NCM also contacted patient cousin, Christopher Davila she states she has a meeting on Wed with disability office and she will be calling the [employer] today for resources.         Expected Discharge Plan and Services                                                 Social Determinants of Health (SDOH) Interventions    Readmission Risk Interventions Readmission Risk Prevention Plan 02/19/2019  Transportation Screening Complete  PCP or Specialist Appt within 5-7 Days Complete  Home Care Screening Complete  Medication Review (RN CM) Complete  Some recent data might be hidden

## 2019-10-18 NOTE — Plan of Care (Signed)
°  Problem: Coping: °Goal: Level of anxiety will decrease °Outcome: Progressing °  °

## 2019-10-18 NOTE — Progress Notes (Signed)
PROGRESS NOTE  Christopher Davila VZD:[id number] DOB: 1971-03-30 DOA: 10/13/2019 PCP: Marliss Coots, NP  HPI/Recap of past 24 hours: HPI from Dr Christopher Davila is a 49 y.o. male with medical history significant of HTN; DM with gastroparesis and blindness; stage 4 CKD; and chronic diastolic CHF presenting with diarrhea and weakness.  He was last admitted from 4/10-12 for acute on chronic diastolic CHF.  He returned to the ER on 4/20 with failure to thrive and weakness, but did not qualify for hospitalization at that time and is not able to be placed in SNF.  He reports that he has been having diarrhea 1 day PTA. He wonders if he ate something that upset his stomach. No nausea or vomiting. No recent antibiotics other than eye drops.  He has been having some problems with weakness in his legs and arms for a few days and it also happened last week. PT came to see him at home last week. In the ED, severe acidosis with gap. Giving Mag, bicarb, LR.  Patient admitted for further management    Today, patient still short of breath, reports some abdominal distention, no BM since presentation.  Denies any nausea/vomiting, fever/chills, cough, abdominal pain, chest pain.     Assessment/Plan: Principal Problem:   Diarrhea Active Problems:   Type 2 diabetes mellitus with hypoglycemia (HCC)   Essential hypertension   Hyperlipidemia   CKD (chronic kidney disease) stage 4, GFR 15-29 ml/min (HCC)   Weakness   Metabolic acidosis, increased anion gap   Acute urinary retention  Diarrhea Resolved C. difficile negative, stool panel negative DC IV hydration Supportive care  Acute on chronic diastolic HF Significant bilateral lower extremity edema/scrotal swelling Noted to have progressive peripheral edema BNP 633 Echo on 01/2019 showed EF of 55 to 60%, left ventricular diastolic Doppler parameters are consistent with pseudonormalization Due to CKD, nephrology consulted to assist in fluid management,  appreciate recs Strict I's and O's, daily weights  CKD stage IV/anion gap metabolic acidosis Creatinine around baseline Improved acidosis Nephrology on board Continue bicarb Daily BMP  Anemia of CKD Anemia panel iron 44, sats 14 Status post 1 dose of Feraheme on 10/16/2019 Follow CBC closely  Hypertension BP uncontrolled Increase hydralazine, restart Coreg  Diabetes mellitus type 2 Episode of hypoglycemia on 10/13/2019 SSI, Accu-Cheks, hypoglycemic protocol Consider discontinuation of Glucotrol upon discharge  Generalized weakness/failure to thrive Patient lives with a cousin, who works and unable to care for him at home PT/OT, recommend SNF (not a candidate unless he can pay privately) TOC consulted-plan to DC home with home health services  Urinary retention Continue indwelling Foley catheter Outpatient follow-up  Blindness Continue eyedrops Awaiting to have surgery        Malnutrition Type:      Malnutrition Characteristics:      Nutrition Interventions:       Estimated body mass index is 29.76 kg/m as calculated from the following:   Height as of this encounter: 5\' 3"  (1.6 m).   Weight as of this encounter: 76.2 kg.     Code Status: Full  Family Communication: Discussed extensively with patient  Disposition Plan: Status is: Inpatient    Dispo: The patient is from: Home              Anticipated d/c is to: Likely DC home with home health services              Anticipated d/c date is: In 2 days  Patient currently is not medically stable to d/c.  Patient requires significant diuresis, nephrology sign off    Consultants:  Nephrology  Procedures:  None  Antimicrobials:  None  DVT prophylaxis: Heparin   Objective: Vitals:   10/17/19 2001 10/18/19 0037 10/18/19 0601 10/18/19 1324  BP: (!) 162/103 (!) 150/94 (!) 176/98 (!) 171/98  Pulse: 87 79 81 82  Resp: 19 18 19 19   Temp: (!) 97.5 F (36.4 C) (!) 97.5 F (36.4  C) 97.8 F (36.6 C) (!) 97.5 F (36.4 C)  TempSrc: Oral Oral Oral Oral  SpO2: 96% 98% 98% 95%  Weight:   76.2 kg   Height:        Intake/Output Summary (Last 24 hours) at 10/18/2019 1636 Last data filed at 10/18/2019 1300 Gross per 24 hour  Intake 1180 ml  Output 1700 ml  Net -520 ml   Filed Weights   10/16/19 0338 10/17/19 0501 10/18/19 0601  Weight: 75.8 kg 79.8 kg 76.2 kg    Exam:  General: NAD, blind  Cardiovascular: S1, S2 present  Respiratory: CTAB  Abdomen: Soft, nontender, nondistended, bowel sounds present  Musculoskeletal: 2+ bilateral pedal edema noted, scrotal swelling  Skin: Normal  Psychiatry: Normal mood    Data Reviewed: CBC: Recent Labs  Lab 10/14/19 0336 10/15/19 0444 10/16/19 0316 10/17/19 0524 10/18/19 0523  WBC 5.4 6.3 6.6 7.1 7.9  NEUTROABS  --  4.1 4.3 5.0 6.0  HGB 7.8* 7.3* 7.4* 7.8* 7.9*  HCT 25.5* 23.8* 23.9* 25.3* 25.4*  MCV 90.1 88.8 88.8 88.8 89.1  PLT 276 254 273 270 741   Basic Metabolic Panel: Recent Labs  Lab 10/14/19 0336 10/15/19 0444 10/16/19 0316 10/17/19 0524 10/18/19 0523  NA 139 137 139 139 140  K 4.8 4.8 4.8 4.2 4.1  CL 110 107 108 107 107  CO2 21* 21* 20* 22 23  GLUCOSE 91 94 95 163* 113*  BUN 58* 63* 66* 62* 58*  CREATININE 3.64* 4.04* 4.01* 3.89* 3.69*  CALCIUM 8.6* 8.0* 8.3* 8.9 9.1  PHOS  --   --   --   --  6.5*   GFR: Estimated Creatinine Clearance: 22.6 mL/min (A) (by C-G formula based on SCr of 3.69 mg/dL (H)). Liver Function Tests: Recent Labs  Lab 10/13/19 0525  AST 43*  ALT 67*  ALKPHOS 137*  BILITOT 0.4  PROT 6.1*  ALBUMIN 2.5*   Recent Labs  Lab 10/13/19 0525  LIPASE 33   No results for input(s): AMMONIA in the last 168 hours. Coagulation Profile: Recent Labs  Lab 10/13/19 0756  INR 1.1   Cardiac Enzymes: No results for input(s): CKTOTAL, CKMB, CKMBINDEX, TROPONINI in the last 168 hours. BNP (last 3 results) No results for input(s): PROBNP in the last 8760  hours. HbA1C: No results for input(s): HGBA1C in the last 72 hours. CBG: Recent Labs  Lab 10/17/19 1232 10/17/19 1621 10/17/19 2127 10/18/19 0556 10/18/19 1205  GLUCAP 207* 156* 119* 123* 162*   Lipid Profile: No results for input(s): CHOL, HDL, LDLCALC, TRIG, CHOLHDL, LDLDIRECT in the last 72 hours. Thyroid Function Tests: No results for input(s): TSH, T4TOTAL, FREET4, T3FREE, THYROIDAB in the last 72 hours. Anemia Panel: Recent Labs    10/15/19 1711 10/16/19 0316  VITAMINB12  --  448  FOLATE  --  6.8  FERRITIN 94 80  TIBC 332 318  IRON 73 44*   Urine analysis:    Component Value Date/Time   COLORURINE YELLOW 10/13/2019 1500   APPEARANCEUR CLOUDY (  A) 10/13/2019 1500   LABSPEC 1.015 10/13/2019 1500   PHURINE 5.0 10/13/2019 1500   GLUCOSEU 50 (A) 10/13/2019 1500   HGBUR MODERATE (A) 10/13/2019 1500   BILIRUBINUR NEGATIVE 10/13/2019 1500   KETONESUR NEGATIVE 10/13/2019 1500   PROTEINUR >=300 (A) 10/13/2019 1500   UROBILINOGEN 0.2 12/13/2014 2056   NITRITE NEGATIVE 10/13/2019 1500   LEUKOCYTESUR LARGE (A) 10/13/2019 1500   Sepsis Labs: @LABRCNTIP (procalcitonin:4,lacticidven:4)  ) Recent Results (from the past 240 hour(s))  Gastrointestinal Panel by PCR , Stool     Status: None   Collection Time: 10/13/19  7:50 AM   Specimen: Stool  Result Value Ref Range Status   Campylobacter species NOT DETECTED NOT DETECTED Final   Plesimonas shigelloides NOT DETECTED NOT DETECTED Final   Salmonella species NOT DETECTED NOT DETECTED Final   Yersinia enterocolitica NOT DETECTED NOT DETECTED Final   Vibrio species NOT DETECTED NOT DETECTED Final   Vibrio cholerae NOT DETECTED NOT DETECTED Final   Enteroaggregative E coli (EAEC) NOT DETECTED NOT DETECTED Final   Enteropathogenic E coli (EPEC) NOT DETECTED NOT DETECTED Final   Enterotoxigenic E coli (ETEC) NOT DETECTED NOT DETECTED Final   Shiga like toxin producing E coli (STEC) NOT DETECTED NOT DETECTED Final    Shigella/Enteroinvasive E coli (EIEC) NOT DETECTED NOT DETECTED Final   Cryptosporidium NOT DETECTED NOT DETECTED Final   Cyclospora cayetanensis NOT DETECTED NOT DETECTED Final   Entamoeba histolytica NOT DETECTED NOT DETECTED Final   Giardia lamblia NOT DETECTED NOT DETECTED Final   Adenovirus F40/41 NOT DETECTED NOT DETECTED Final   Astrovirus NOT DETECTED NOT DETECTED Final   Norovirus GI/GII NOT DETECTED NOT DETECTED Final   Rotavirus A NOT DETECTED NOT DETECTED Final   Sapovirus (I, II, IV, and V) NOT DETECTED NOT DETECTED Final    Comment: Performed at [facility], [address] [place], [place] [address]  C Difficile Quick Screen w PCR reflex     Status: None   Collection Time: 10/13/19  7:50 AM   Specimen: Stool  Result Value Ref Range Status   C Diff antigen NEGATIVE NEGATIVE Final   C Diff toxin NEGATIVE NEGATIVE Final   C Diff interpretation No C. difficile detected.  Final    Comment: Performed at [facility], [place] [address], [place],  [address]  Respiratory Panel by RT PCR (Flu A&B, Covid) - Nasopharyngeal Swab     Status: None   Collection Time: 10/13/19 10:19 AM   Specimen: Nasopharyngeal Swab  Result Value Ref Range Status   SARS Coronavirus 2 by RT PCR NEGATIVE NEGATIVE Final    Comment: (NOTE) SARS-CoV-2 target nucleic acids are NOT DETECTED. The SARS-CoV-2 RNA is generally detectable in upper respiratoy specimens during the acute phase of infection. The lowest concentration of SARS-CoV-2 viral copies this assay can detect is 131 copies/mL. A negative result does not preclude SARS-Cov-2 infection and should not be used as the sole basis for treatment or other patient management decisions. A negative result may occur with  improper specimen collection/handling, submission of specimen other than nasopharyngeal swab, presence of viral mutation(s) within the areas targeted by this assay, and inadequate number of viral copies (<131  copies/mL). A negative result must be combined with clinical observations, patient history, and epidemiological information. The expected result is Negative. Fact Sheet for Patients:  PinkCheek.be Fact Sheet for Healthcare Providers:  GravelBags.it This test is not yet ap proved or cleared by the [place] FDA and  has been  authorized for detection and/or diagnosis of SARS-CoV-2 by FDA under an Emergency Use Authorization (EUA). This EUA will remain  in effect (meaning this test can be used) for the duration of the COVID-19 declaration under Section 564(b)(1) of the Act, 21 U.S.C. section 360bbb-3(b)(1), unless the authorization is terminated or revoked sooner.    Influenza A by PCR NEGATIVE NEGATIVE Final   Influenza B by PCR NEGATIVE NEGATIVE Final    Comment: (NOTE) The Xpert Xpress SARS-CoV-2/FLU/RSV assay is intended as an aid in  the diagnosis of influenza from Nasopharyngeal swab specimens and  should not be used as a sole basis for treatment. Nasal washings and  aspirates are unacceptable for Xpert Xpress SARS-CoV-2/FLU/RSV  testing. Fact Sheet for Patients: PinkCheek.be Fact Sheet for Healthcare Providers: GravelBags.it This test is not yet approved or cleared by the [place] FDA and  has been authorized for detection and/or diagnosis of SARS-CoV-2 by  FDA under an Emergency Use Authorization (EUA). This EUA will remain  in effect (meaning this test can be used) for the duration of the  Covid-19 declaration under Section 564(b)(1) of the Act, 21  U.S.C. section 360bbb-3(b)(1), unless the authorization is  terminated or revoked. Performed at [facility], [place] [address] [place], [place] [address]       Studies: No results found.  Scheduled Meds: . aspirin EC  81 mg Oral Daily  . brimonidine  1 drop Left Eye BID  . carvedilol  25 mg  Oral BID WC  . Chlorhexidine Gluconate Cloth  6 each Topical Daily  . dorzolamide-timolol  1 drop Left Eye BID  . furosemide  80 mg Oral Daily  . gabapentin  100 mg Oral TID  . gatifloxacin  1 drop Left Eye QID  . heparin  5,000 Units Subcutaneous Q8H  . hydrALAZINE  12.5 mg Oral TID  . insulin aspart  0-9 Units Subcutaneous TID WC  . latanoprost  1 drop Left Eye QHS  . senna-docusate  1 tablet Oral BID  . sodium bicarbonate  650 mg Oral Daily    Continuous Infusions: . sodium chloride 250 mL (10/16/19 0909)     LOS: 3 days     Alma Friendly, MD [facility]  If 7PM-7AM, please contact night-coverage [email or web address] 10/18/2019, 4:36 PM

## 2019-10-18 NOTE — Progress Notes (Signed)
Patient ID: Christopher Davila, male   DOB: 1970-08-19, 49 y.o.   MRN: [id number] S: No new complaints. O:BP (!) 176/98 (BP Location: Left Arm)   Pulse 81   Temp 97.8 F (36.6 C) (Oral)   Resp 19   Ht 5\' 3"  (1.6 m)   Wt 76.2 kg   SpO2 98%   BMI 29.76 kg/m   Intake/Output Summary (Last 24 hours) at 10/18/2019 1142 Last data filed at 10/18/2019 0900 Gross per 24 hour  Intake 1060 ml  Output 2300 ml  Net -1240 ml   Intake/Output: I/O last 3 completed shifts: In: 4128 [P.O.:1420] Out: 3650 [Urine:3650]  Intake/Output this shift:  Total I/O In: 120 [P.O.:120] Out: -  Weight change: -3.629 kg Gen: NAD CVS: no rub Resp: cta Abd: +BS, soft, NT/Nd Ext:1+ edema  Recent Labs  Lab 10/13/19 0525 10/13/19 0525 10/13/19 0803 10/13/19 0957 10/14/19 0336 10/15/19 0444 10/16/19 0316 10/17/19 0524 10/18/19 0523  NA 140   < > 139 139 139 137 139 139 140  K 4.5   < > 5.1 5.1 4.8 4.8 4.8 4.2 4.1  CL 110  --   --  109 110 107 108 107 107  CO2 10*  --   --  20* 21* 21* 20* 22 23  GLUCOSE 36*  --   --  155* 91 94 95 163* 113*  BUN 63*  --   --  60* 58* 63* 66* 62* 58*  CREATININE 4.00*  --   --  3.86* 3.64* 4.04* 4.01* 3.89* 3.69*  ALBUMIN 2.5*  --   --   --   --   --   --   --   --   CALCIUM 8.8*  --   --  8.6* 8.6* 8.0* 8.3* 8.9 9.1  PHOS  --   --   --   --   --   --   --   --  6.5*  AST 43*  --   --   --   --   --   --   --   --   ALT 67*  --   --   --   --   --   --   --   --    < > = values in this interval not displayed.   Liver Function Tests: Recent Labs  Lab 10/13/19 0525  AST 43*  ALT 67*  ALKPHOS 137*  BILITOT 0.4  PROT 6.1*  ALBUMIN 2.5*   Recent Labs  Lab 10/13/19 0525  LIPASE 33   No results for input(s): AMMONIA in the last 168 hours. CBC: Recent Labs  Lab 10/14/19 0336 10/14/19 0336 10/15/19 0444 10/15/19 0444 10/16/19 0316 10/17/19 0524 10/18/19 0523  WBC 5.4   < > 6.3   < > 6.6 7.1 7.9  NEUTROABS  --   --  4.1   < > 4.3 5.0 6.0  HGB 7.8*   < >  7.3*   < > 7.4* 7.8* 7.9*  HCT 25.5*   < > 23.8*   < > 23.9* 25.3* 25.4*  MCV 90.1  --  88.8  --  88.8 88.8 89.1  PLT 276   < > 254   < > 273 270 256   < > = values in this interval not displayed.   Cardiac Enzymes: No results for input(s): CKTOTAL, CKMB, CKMBINDEX, TROPONINI in the last 168 hours. CBG: Recent Labs  Lab 10/17/19 1138 10/17/19 1232 10/17/19 1621 10/17/19  2127 10/18/19 0556  GLUCAP 208* 207* 156* 119* 123*    Iron Studies:  Recent Labs    10/16/19 0316  IRON 44*  TIBC 318  FERRITIN 80   Studies/Results: No results found. Marland Kitchen aspirin EC  81 mg Oral Daily  . brimonidine  1 drop Left Eye BID  . carvedilol  25 mg Oral BID WC  . Chlorhexidine Gluconate Cloth  6 each Topical Daily  . dorzolamide-timolol  1 drop Left Eye BID  . gabapentin  100 mg Oral TID  . gatifloxacin  1 drop Left Eye QID  . heparin  5,000 Units Subcutaneous Q8H  . hydrALAZINE  12.5 mg Oral TID  . insulin aspart  0-9 Units Subcutaneous TID WC  . latanoprost  1 drop Left Eye QHS  . senna-docusate  1 tablet Oral BID  . sodium bicarbonate  650 mg Oral Daily    BMET    Component Value Date/Time   NA 140 10/18/2019 0523   K 4.1 10/18/2019 0523   CL 107 10/18/2019 0523   CO2 23 10/18/2019 0523   GLUCOSE 113 (H) 10/18/2019 0523   BUN 58 (H) 10/18/2019 0523   CREATININE 3.69 (H) 10/18/2019 0523   CALCIUM 9.1 10/18/2019 0523   GFRNONAA 18 (L) 10/18/2019 0523   GFRAA 21 (L) 10/18/2019 0523   CBC    Component Value Date/Time   WBC 7.9 10/18/2019 0523   RBC 2.85 (L) 10/18/2019 0523   HGB 7.9 (L) 10/18/2019 0523   HCT 25.4 (L) 10/18/2019 0523   PLT 256 10/18/2019 0523   MCV 89.1 10/18/2019 0523   MCH 27.7 10/18/2019 0523   MCHC 31.1 10/18/2019 0523   RDW 14.1 10/18/2019 0523   LYMPHSABS 0.8 10/18/2019 0523   MONOABS 0.7 10/18/2019 0523   EOSABS 0.2 10/18/2019 0523   BASOSABS 0.0 10/18/2019 0523     Assessment/Plan:  1. AKI/CKD stage IV- in setting of acute on chronic  diastolic CHF and diuresis.  His BUN/Cr have been slowly improving and has been diuresing well.   Heading towards his baseline of 3-3.5.  Will need to f/u with Dr. Carolin Sicks after discharge. 2. Acute on chronic diastolic CHF- improving with IV lasix 80 mg and metolazone 5 mg.  Will start po lasix 80 mg daily and follow UOP and Scr.  3. Urinary retention- s/p foley catheter and will likely need to be discharged with this and follow up with Urology as an outpatient.  4. Anemia of CKD stage IV- hgb improving.  Received IV feraheme on 5/1 and Aranesp 40 mcg on 4/30 5. DM type 2 - per primary svc  Donetta Potts, MD [facility] [phone]

## 2019-10-19 LAB — CBC WITH DIFFERENTIAL/PLATELET
Abs Immature Granulocytes: 0.11 10*3/uL — ABNORMAL HIGH (ref 0.00–0.07)
Basophils Absolute: 0 10*3/uL (ref 0.0–0.1)
Basophils Relative: 1 %
Eosinophils Absolute: 0.2 10*3/uL (ref 0.0–0.5)
Eosinophils Relative: 4 %
HCT: 23.5 % — ABNORMAL LOW (ref 39.0–52.0)
Hemoglobin: 7.2 g/dL — ABNORMAL LOW (ref 13.0–17.0)
Immature Granulocytes: 2 %
Lymphocytes Relative: 13 %
Lymphs Abs: 0.8 10*3/uL (ref 0.7–4.0)
MCH: 27.7 pg (ref 26.0–34.0)
MCHC: 30.6 g/dL (ref 30.0–36.0)
MCV: 90.4 fL (ref 80.0–100.0)
Monocytes Absolute: 0.8 10*3/uL (ref 0.1–1.0)
Monocytes Relative: 13 %
Neutro Abs: 4.2 10*3/uL (ref 1.7–7.7)
Neutrophils Relative %: 67 %
Platelets: 225 10*3/uL (ref 150–400)
RBC: 2.6 MIL/uL — ABNORMAL LOW (ref 4.22–5.81)
RDW: 14.5 % (ref 11.5–15.5)
WBC: 6.2 10*3/uL (ref 4.0–10.5)
nRBC: 0 % (ref 0.0–0.2)

## 2019-10-19 LAB — BASIC METABOLIC PANEL
Anion gap: 8 (ref 5–15)
BUN: 61 mg/dL — ABNORMAL HIGH (ref 6–20)
CO2: 24 mmol/L (ref 22–32)
Calcium: 8.7 mg/dL — ABNORMAL LOW (ref 8.9–10.3)
Chloride: 104 mmol/L (ref 98–111)
Creatinine, Ser: 3.55 mg/dL — ABNORMAL HIGH (ref 0.61–1.24)
GFR calc Af Amer: 22 mL/min — ABNORMAL LOW (ref >60)
GFR calc non Af Amer: 19 mL/min — ABNORMAL LOW (ref >60)
Glucose, Bld: 141 mg/dL — ABNORMAL HIGH (ref 70–99)
Potassium: 3.8 mmol/L (ref 3.5–5.1)
Sodium: 136 mmol/L (ref 135–145)

## 2019-10-19 LAB — GLUCOSE, CAPILLARY
Glucose-Capillary: 136 mg/dL — ABNORMAL HIGH (ref 70–99)
Glucose-Capillary: 211 mg/dL — ABNORMAL HIGH (ref 70–99)
Glucose-Capillary: 240 mg/dL — ABNORMAL HIGH (ref 70–99)
Glucose-Capillary: 247 mg/dL — ABNORMAL HIGH (ref 70–99)

## 2019-10-19 MED ORDER — MAGNESIUM CITRATE PO SOLN: 1.0000 | Freq: Once | ORAL | Status: DC

## 2019-10-19 MED ORDER — FUROSEMIDE 10 MG/ML IJ SOLN
80.0000 mg | Freq: Every day | INTRAMUSCULAR | Status: DC
  Administered 2019-10-20: 80 mg via INTRAVENOUS
  Filled 2019-10-19: qty 8

## 2019-10-19 MED ORDER — BISACODYL 10 MG RE SUPP
10.0000 mg | Freq: Once | RECTAL | Status: DC
  Filled 2019-10-19: qty 1

## 2019-10-19 NOTE — Progress Notes (Signed)
Patient ID: Christopher Davila, male   DOB: 11-29-1970, 49 y.o.   MRN: [id number] S: Had some SOB yesterday but resting comfortably this morning.  No significant diuresis overnight with po lasix. O:BP 131/81 (BP Location: Right Arm)   Pulse 84   Temp 98.3 F (36.8 C) (Oral)   Resp 19   Ht 5\' 3"  (1.6 m)   Wt 77.1 kg   SpO2 100%   BMI 30.11 kg/m   Intake/Output Summary (Last 24 hours) at 10/19/2019 1052 Last data filed at 10/19/2019 0852 Gross per 24 hour  Intake 1040 ml  Output 1100 ml  Net -60 ml   Intake/Output: I/O last 3 completed shifts: In: 1520 [P.O.:1520] Out: 2800 [Urine:2800]  Intake/Output this shift:  Total I/O In: 120 [P.O.:120] Out: -  Weight change: 0.907 kg Gen: NAD CVS: no rub Resp: cta  Abd:+BS, soft, NT/ND Ext:2+ edema  Recent Labs  Lab 10/13/19 0525 10/13/19 0803 10/13/19 0957 10/14/19 0336 10/15/19 0444 10/16/19 0316 10/17/19 0524 10/18/19 0523 10/19/19 0427  NA 140   < > 139 139 137 139 139 140 136  K 4.5   < > 5.1 4.8 4.8 4.8 4.2 4.1 3.8  CL 110   < > 109 110 107 108 107 107 104  CO2 10*   < > 20* 21* 21* 20* 22 23 24   GLUCOSE 36*   < > 155* 91 94 95 163* 113* 141*  BUN 63*   < > 60* 58* 63* 66* 62* 58* 61*  CREATININE 4.00*   < > 3.86* 3.64* 4.04* 4.01* 3.89* 3.69* 3.55*  ALBUMIN 2.5*  --   --   --   --   --   --   --   --   CALCIUM 8.8*   < > 8.6* 8.6* 8.0* 8.3* 8.9 9.1 8.7*  PHOS  --   --   --   --   --   --   --  6.5*  --   AST 43*  --   --   --   --   --   --   --   --   ALT 67*  --   --   --   --   --   --   --   --    < > = values in this interval not displayed.   Liver Function Tests: Recent Labs  Lab 10/13/19 0525  AST 43*  ALT 67*  ALKPHOS 137*  BILITOT 0.4  PROT 6.1*  ALBUMIN 2.5*   Recent Labs  Lab 10/13/19 0525  LIPASE 33   No results for input(s): AMMONIA in the last 168 hours. CBC: Recent Labs  Lab 10/15/19 0444 10/15/19 0444 10/16/19 0316 10/16/19 0316 10/17/19 0524 10/18/19 0523 10/19/19 0427  WBC 6.3    < > 6.6   < > 7.1 7.9 6.2  NEUTROABS 4.1   < > 4.3   < > 5.0 6.0 4.2  HGB 7.3*   < > 7.4*   < > 7.8* 7.9* 7.2*  HCT 23.8*   < > 23.9*   < > 25.3* 25.4* 23.5*  MCV 88.8  --  88.8  --  88.8 89.1 90.4  PLT 254   < > 273   < > 270 256 225   < > = values in this interval not displayed.   Cardiac Enzymes: No results for input(s): CKTOTAL, CKMB, CKMBINDEX, TROPONINI in the last 168 hours. CBG: Recent Labs  Lab 10/18/19  6195 10/18/19 1205 10/18/19 1652 10/18/19 2127 10/19/19 0619  GLUCAP 123* 162* 213* 210* 136*    Iron Studies: No results for input(s): IRON, TIBC, TRANSFERRIN, FERRITIN in the last 72 hours. Studies/Results: No results found. Marland Kitchen aspirin EC  81 mg Oral Daily  . brimonidine  1 drop Left Eye BID  . carvedilol  25 mg Oral BID WC  . Chlorhexidine Gluconate Cloth  6 each Topical Daily  . dorzolamide-timolol  1 drop Left Eye BID  . [START ON 10/20/2019] furosemide  80 mg Intravenous Daily  . gabapentin  100 mg Oral TID  . gatifloxacin  1 drop Left Eye QID  . heparin  5,000 Units Subcutaneous Q8H  . hydrALAZINE  25 mg Oral TID  . insulin aspart  0-9 Units Subcutaneous TID WC  . latanoprost  1 drop Left Eye QHS  . senna-docusate  1 tablet Oral BID  . sodium bicarbonate  650 mg Oral Daily    BMET    Component Value Date/Time   NA 136 10/19/2019 0427   K 3.8 10/19/2019 0427   CL 104 10/19/2019 0427   CO2 24 10/19/2019 0427   GLUCOSE 141 (H) 10/19/2019 0427   BUN 61 (H) 10/19/2019 0427   CREATININE 3.55 (H) 10/19/2019 0427   CALCIUM 8.7 (L) 10/19/2019 0427   GFRNONAA 19 (L) 10/19/2019 0427   GFRAA 22 (L) 10/19/2019 0427   CBC    Component Value Date/Time   WBC 6.2 10/19/2019 0427   RBC 2.60 (L) 10/19/2019 0427   HGB 7.2 (L) 10/19/2019 0427   HCT 23.5 (L) 10/19/2019 0427   PLT 225 10/19/2019 0427   MCV 90.4 10/19/2019 0427   MCH 27.7 10/19/2019 0427   MCHC 30.6 10/19/2019 0427   RDW 14.5 10/19/2019 0427   LYMPHSABS 0.8 10/19/2019 0427   MONOABS 0.8  10/19/2019 0427   EOSABS 0.2 10/19/2019 0427   BASOSABS 0.0 10/19/2019 0427    Assessment/Plan:  1. AKI/CKD stage IV- in setting of acute on chronic diastolic CHF and diuresis.  His BUN/Cr have been slowly improving and has been diuresing well.   Heading towards his baseline of 3-3.5.  Will need to f/u with Dr. Carolin Sicks after discharge. 2. Acute on chronic diastolic CHF- improving with IV lasix 80 mg and metolazone 5 mg.  Did not respond to po lasix 80 mg daily so will give IV lasix again today and then if he does well will try po lasix 80 mg bid and follow UOP and Scr.  3. Urinary retention- s/p foley catheter and will likely need to be discharged with this and follow up with Urology as an outpatient.  4. Anemia of CKD stage IV- hgb improving.  Received IV feraheme on 5/1 and Aranesp 40 mcg on 4/30 5. DM type 2 - per primary svc  Donetta Potts, MD [facility] [phone]

## 2019-10-19 NOTE — Progress Notes (Signed)
Occupational Therapy Treatment Patient Details Name: Christopher Davila MRN: [id number] DOB: 01-10-1971 Today's Date: 10/19/2019    History of present illness Pt is an 49 y.o. male with PMH DM, HTN, CKD stage IIIb, peripheral neuropathy, diabetic retinopathy, bilateral vision loss, glaucoma, chronic diastolic heart failure who presented to ED with increased weakness.   OT comments  Pt progressing well with therapy today. Pt following commands and requiring multimodal cues for blindness and slower processing, but answering questions well regarding assist level as PLOF. Pt refers to 2 cousins who assisted with ADL tasks. Pt reports independence with feeding/grooming and HHPT was coming out as well.   Pt set-upA for most tasks; pt requiring verbal and tactile cues to assist due to blindness in a new environment Pt stood at sink x5 mins with set-upA for grooming tasks. 4L O2 required as pt desats <88% O2 on RA. pt ambulating in hallway with chair follow. Pt reports weakness in BLEs and SOB. O2 >94% on 4L. Pt progressing to [facility] level therapies and may progress to intermittent supervsion assist needed. OT following.    Follow Up Recommendations  SNF;Home health OT;Supervision/Assistance - 24 hour(pt progressing well)    Equipment Recommendations  3 in 1 bedside commode    Recommendations for Other Services      Precautions / Restrictions Precautions Precautions: Fall;Other (comment) Precaution Comments: blind Restrictions Weight Bearing Restrictions: No       Mobility Bed Mobility Overal bed mobility: Needs Assistance Bed Mobility: Supine to Sit     Supine to sit: Min guard     General bed mobility comments: minguardA for facilitation/activation of LB tasks  Transfers Overall transfer level: Needs assistance Equipment used: Ambulation equipment used Transfers: Sit to/from Stand Sit to Stand: Min assist;Min guard;From elevated surface         General transfer comment: minA for sit  to stand from bed; multiple sit to stands fromstedy with minguardA    Balance Overall balance assessment: Needs assistance Sitting-balance support: No upper extremity supported;Feet supported Sitting balance-Leahy Scale: Good     Standing balance support: Single extremity supported;No upper extremity supported;During functional activity Standing balance-Leahy Scale: Fair Standing balance comment: Pt at times not holding on at all to counter.                            ADL either performed or assessed with clinical judgement   ADL Overall ADL's : Needs assistance/impaired     Grooming: Set up;Sitting;Standing Grooming Details (indicate cue type and reason): standing at stedy ~5 mins for UB ADL             Lower Body Dressing: Maximal assistance;Sitting/lateral leans Lower Body Dressing Details (indicate cue type and reason): pt reports "I was already doing this."             Functional mobility during ADLs: Minimal assistance;Rolling walker;Cueing for safety;Cueing for sequencing General ADL Comments: Pt set-upA for most tasks; pt requiring verbal and tactile cues to assist due to blindnes in a new environment.     Vision   Vision Assessment?: Vision impaired- to be further tested in functional context Additional Comments: blind. "I can't see anything."   Perception     Praxis      Cognition Arousal/Alertness: Awake/alert Behavior During Therapy: Flat affect Overall Cognitive Status: Within Functional Limits for tasks assessed Area of Impairment: Safety/judgement;Memory;Problem solving  Memory: Decreased short-term memory   Safety/Judgement: Decreased awareness of deficits   Problem Solving: Slow processing;Requires verbal cues General Comments: Pt able to follow simple commands, but often requires multimodal cues to perform tasks; "go right" and pt continues to step forward into wall.        Exercises     Shoulder  Instructions       General Comments 4L O2 required as pt desats <88% O2 on RA. pt ambulating in hallway with chair follow. Pt reports weakness in BLEs and SOB. O2 >94% on 4L.    Pertinent Vitals/ Pain       Pain Assessment: Faces Faces Pain Scale: Hurts a little bit Pain Location: bilateral LE Pain Descriptors / Indicators: Sore Pain Intervention(s): Monitored during session  Home Living                                          Prior Functioning/Environment              Frequency  Min 2X/week        Progress Toward Goals  OT Goals(current goals can now be found in the care plan section)  Progress towards OT goals: Progressing toward goals  Acute Rehab OT Goals Patient Stated Goal: get stronger OT Goal Formulation: With patient Time For Goal Achievement: 10/28/19 Potential to Achieve Goals: Good ADL Goals Pt Will Perform Grooming: with min assist;standing Pt Will Perform Lower Body Bathing: with min assist;sit to/from stand;sitting/lateral leans Pt Will Perform Lower Body Dressing: with min assist;sitting/lateral leans;sit to/from stand Pt Will Transfer to Toilet: with min assist;squat pivot transfer;bedside commode Additional ADL Goal #1: Pt will engage in standing BADL task at sink with min A for 5-7 minutes Additional ADL Goal #2: Pt will implement visual compensatory strategies to BADL task with min A  Plan Discharge plan needs to be updated    Co-evaluation                 AM-PAC OT "6 Clicks" Daily Activity     Outcome Measure   Help from another person eating meals?: None Help from another person taking care of personal grooming?: A Little Help from another person toileting, which includes using toliet, bedpan, or urinal?: A Lot Help from another person bathing (including washing, rinsing, drying)?: A Lot Help from another person to put on and taking off regular upper body clothing?: A Little Help from another person to put on  and taking off regular lower body clothing?: A Lot 6 Click Score: 16    End of Session Equipment Utilized During Treatment: Gait belt;Rolling walker;Oxygen  OT Visit Diagnosis: Unsteadiness on feet (R26.81);Pain Pain - part of body: Leg(b/l)   Activity Tolerance Patient tolerated treatment well   Patient Left in chair;with call bell/phone within reach;with chair alarm set   Nurse Communication Mobility status;Need for lift equipment        Time: 1225-1300 OT Time Calculation (min): 35 min  Charges: OT General Charges $OT Visit: 1 Visit OT Treatments $Self Care/Home Management : 8-22 mins  Jefferey Pica, OTR/L Acute Rehabilitation Services Pager: [phone] Office: [phone]    Dallas Scorsone  C 10/19/2019, 3:13 PM

## 2019-10-19 NOTE — Progress Notes (Signed)
PROGRESS NOTE  Christopher Davila RXV:[id number] DOB: [birth year] DOA: 10/13/2019 PCP: Christopher Coots, NP  HPI/Recap of past 24 hours: HPI from Dr Christopher Davila is a 49 y.o. male with medical history significant of HTN; DM with gastroparesis and blindness; stage 4 CKD; and chronic diastolic CHF presenting with diarrhea and weakness.  He was last admitted from 4/10-12 for acute on chronic diastolic CHF.  He returned to the ER on 4/20 with failure to thrive and weakness, but did not qualify for hospitalization at that time and is not able to be placed in SNF.  He reports that he has been having diarrhea 1 day PTA. He wonders if he ate something that upset his stomach. No nausea or vomiting. No recent antibiotics other than eye drops.  He has been having some problems with weakness in his legs and arms for a few days and it also happened last week. PT came to see him at home last week. In the ED, severe acidosis with gap. Giving Mag, bicarb, LR.  Patient admitted for further management     Today, patient reports still feeling the same, still slightly short of breath, with some abdominal distention.  Reports no BM since admission when he arrived with diarrhea.  Denies any abdominal pain, nausea/vomiting, fever/chills, chest pain, cough.     Assessment/Plan: Principal Problem:   Diarrhea Active Problems:   Type 2 diabetes mellitus with hypoglycemia (HCC)   Essential hypertension   Hyperlipidemia   CKD (chronic kidney disease) stage 4, GFR 15-29 ml/min (HCC)   Weakness   Metabolic acidosis, increased anion gap   Acute urinary retention  Diarrhea Resolved C. difficile negative, stool panel negative DC IV hydration Supportive care  Acute on chronic diastolic HF Significant bilateral lower extremity edema/scrotal swelling Noted to have progressive peripheral edema BNP 633 Echo on 01/2019 showed EF of 55 to 60%, left ventricular diastolic Doppler parameters are consistent with  pseudonormalization Due to CKD, nephrology consulted to assist in fluid management, appreciate recs Strict I's and O's, daily weights  CKD stage IV/anion gap metabolic acidosis Creatinine around baseline Improved acidosis Nephrology on board Continue bicarb Daily BMP  Anemia of CKD Anemia panel iron 44, sats 14 Status post 1 dose of Feraheme on 10/16/2019 Follow CBC closely  Hypertension BP uncontrolled Increase hydralazine, restart Coreg  Diabetes mellitus type 2 Episode of hypoglycemia on 10/13/2019 SSI, Accu-Cheks, hypoglycemic protocol Consider discontinuation of Glucotrol upon discharge  Generalized weakness/failure to thrive Patient lives with a cousin, who works and unable to care for him at home PT/OT, recommend SNF (not a candidate unless he can pay privately) TOC consulted-plan to DC home with home health services  Urinary retention Continue indwelling Foley catheter Outpatient follow-up  Blindness Continue eyedrops Awaiting to have surgery        Malnutrition Type:      Malnutrition Characteristics:      Nutrition Interventions:       Estimated body mass index is 30.11 kg/m as calculated from the following:   Height as of this encounter: 5\' 3"  (1.6 m).   Weight as of this encounter: 77.1 kg.     Code Status: Full  Family Communication: Discussed extensively with patient  Disposition Plan: Status is: Inpatient    Dispo: The patient is from: Home              Anticipated d/c is to: Likely DC home with home health services  Anticipated d/c date is: In 2 days              Patient currently is not medically stable to d/c.  Patient requires significant diuresis, nephrology sign off    Consultants:  Nephrology  Procedures:  None  Antimicrobials:  None  DVT prophylaxis: Heparin   Objective: Vitals:   10/19/19 0359 10/19/19 1137 10/19/19 1300 10/19/19 1512  BP: 131/81 (!) 144/77  (!) 154/94  Pulse:  76  82    Resp: 19 20  20   Temp: 98.3 F (36.8 C) (!) 97.4 F (36.3 C) 97.9 F (36.6 C) 97.6 F (36.4 C)  TempSrc: Oral Oral Oral Oral  SpO2: 100% 100%  97%  Weight: 77.1 kg     Height:        Intake/Output Summary (Last 24 hours) at 10/19/2019 1646 Last data filed at 10/19/2019 1615 Gross per 24 hour  Intake 940 ml  Output 1625 ml  Net -685 ml   Filed Weights   10/17/19 0501 10/18/19 0601 10/19/19 0359  Weight: 79.8 kg 76.2 kg 77.1 kg    Exam:  General: NAD, blind  Cardiovascular: S1, S2 present  Respiratory: CTAB  Abdomen: Soft, nontender, nondistended, bowel sounds present  Musculoskeletal: 2+ bilateral pedal edema noted, scrotal swelling  Skin: Normal  Psychiatry: Normal mood    Data Reviewed: CBC: Recent Labs  Lab 10/15/19 0444 10/16/19 0316 10/17/19 0524 10/18/19 0523 10/19/19 0427  WBC 6.3 6.6 7.1 7.9 6.2  NEUTROABS 4.1 4.3 5.0 6.0 4.2  HGB 7.3* 7.4* 7.8* 7.9* 7.2*  HCT 23.8* 23.9* 25.3* 25.4* 23.5*  MCV 88.8 88.8 88.8 89.1 90.4  PLT 254 273 270 256 409   Basic Metabolic Panel: Recent Labs  Lab 10/15/19 0444 10/16/19 0316 10/17/19 0524 10/18/19 0523 10/19/19 0427  NA 137 139 139 140 136  K 4.8 4.8 4.2 4.1 3.8  CL 107 108 107 107 104  CO2 21* 20* 22 23 24   GLUCOSE 94 95 163* 113* 141*  BUN 63* 66* 62* 58* 61*  CREATININE 4.04* 4.01* 3.89* 3.69* 3.55*  CALCIUM 8.0* 8.3* 8.9 9.1 8.7*  PHOS  --   --   --  6.5*  --    GFR: Estimated Creatinine Clearance: 23.7 mL/min (A) (by C-G formula based on SCr of 3.55 mg/dL (H)). Liver Function Tests: Recent Labs  Lab 10/13/19 0525  AST 43*  ALT 67*  ALKPHOS 137*  BILITOT 0.4  PROT 6.1*  ALBUMIN 2.5*   Recent Labs  Lab 10/13/19 0525  LIPASE 33   No results for input(s): AMMONIA in the last 168 hours. Coagulation Profile: Recent Labs  Lab 10/13/19 0756  INR 1.1   Cardiac Enzymes: No results for input(s): CKTOTAL, CKMB, CKMBINDEX, TROPONINI in the last 168 hours. BNP (last 3 results) No  results for input(s): PROBNP in the last 8760 hours. HbA1C: No results for input(s): HGBA1C in the last 72 hours. CBG: Recent Labs  Lab 10/18/19 1652 10/18/19 2127 10/19/19 0619 10/19/19 1138 10/19/19 1615  GLUCAP 213* 210* 136* 211* 240*   Lipid Profile: No results for input(s): CHOL, HDL, LDLCALC, TRIG, CHOLHDL, LDLDIRECT in the last 72 hours. Thyroid Function Tests: No results for input(s): TSH, T4TOTAL, FREET4, T3FREE, THYROIDAB in the last 72 hours. Anemia Panel: No results for input(s): VITAMINB12, FOLATE, FERRITIN, TIBC, IRON, RETICCTPCT in the last 72 hours. Urine analysis:    Component Value Date/Time   COLORURINE YELLOW 10/13/2019 1500   APPEARANCEUR CLOUDY (A) 10/13/2019 1500  LABSPEC 1.015 10/13/2019 1500   PHURINE 5.0 10/13/2019 1500   GLUCOSEU 50 (A) 10/13/2019 1500   HGBUR MODERATE (A) 10/13/2019 1500   BILIRUBINUR NEGATIVE 10/13/2019 1500   KETONESUR NEGATIVE 10/13/2019 1500   PROTEINUR >=300 (A) 10/13/2019 1500   UROBILINOGEN 0.2 12/13/2014 2056   NITRITE NEGATIVE 10/13/2019 1500   LEUKOCYTESUR LARGE (A) 10/13/2019 1500   Sepsis Labs: @LABRCNTIP (procalcitonin:4,lacticidven:4)  ) Recent Results (from the past 240 hour(s))  Gastrointestinal Panel by PCR , Stool     Status: None   Collection Time: 10/13/19  7:50 AM   Specimen: Stool  Result Value Ref Range Status   Campylobacter species NOT DETECTED NOT DETECTED Final   Plesimonas shigelloides NOT DETECTED NOT DETECTED Final   Salmonella species NOT DETECTED NOT DETECTED Final   Yersinia enterocolitica NOT DETECTED NOT DETECTED Final   Vibrio species NOT DETECTED NOT DETECTED Final   Vibrio cholerae NOT DETECTED NOT DETECTED Final   Enteroaggregative E coli (EAEC) NOT DETECTED NOT DETECTED Final   Enteropathogenic E coli (EPEC) NOT DETECTED NOT DETECTED Final   Enterotoxigenic E coli (ETEC) NOT DETECTED NOT DETECTED Final   Shiga like toxin producing E coli (STEC) NOT DETECTED NOT DETECTED Final    Shigella/Enteroinvasive E coli (EIEC) NOT DETECTED NOT DETECTED Final   Cryptosporidium NOT DETECTED NOT DETECTED Final   Cyclospora cayetanensis NOT DETECTED NOT DETECTED Final   Entamoeba histolytica NOT DETECTED NOT DETECTED Final   Giardia lamblia NOT DETECTED NOT DETECTED Final   Adenovirus F40/41 NOT DETECTED NOT DETECTED Final   Astrovirus NOT DETECTED NOT DETECTED Final   Norovirus GI/GII NOT DETECTED NOT DETECTED Final   Rotavirus A NOT DETECTED NOT DETECTED Final   Sapovirus (I, II, IV, and V) NOT DETECTED NOT DETECTED Final    Comment: Performed at [facility], [address] [place], [place] [address]  C Difficile Quick Screen w PCR reflex     Status: None   Collection Time: 10/13/19  7:50 AM   Specimen: Stool  Result Value Ref Range Status   C Diff antigen NEGATIVE NEGATIVE Final   C Diff toxin NEGATIVE NEGATIVE Final   C Diff interpretation No C. difficile detected.  Final    Comment: Performed at [facility], [place] [address], [place],  [address]  Respiratory Panel by RT PCR (Flu A&B, Covid) - Nasopharyngeal Swab     Status: None   Collection Time: 10/13/19 10:19 AM   Specimen: Nasopharyngeal Swab  Result Value Ref Range Status   SARS Coronavirus 2 by RT PCR NEGATIVE NEGATIVE Final    Comment: (NOTE) SARS-CoV-2 target nucleic acids are NOT DETECTED. The SARS-CoV-2 RNA is generally detectable in upper respiratoy specimens during the acute phase of infection. The lowest concentration of SARS-CoV-2 viral copies this assay can detect is 131 copies/mL. A negative result does not preclude SARS-Cov-2 infection and should not be used as the sole basis for treatment or other patient management decisions. A negative result may occur with  improper specimen collection/handling, submission of specimen other than nasopharyngeal swab, presence of viral mutation(s) within the areas targeted by this assay, and inadequate number of viral copies (<131  copies/mL). A negative result must be combined with clinical observations, patient history, and epidemiological information. The expected result is Negative. Fact Sheet for Patients:  PinkCheek.be Fact Sheet for Healthcare Providers:  GravelBags.it This test is not yet ap proved or cleared by the [place] FDA and  has been authorized for detection and/or diagnosis  of SARS-CoV-2 by FDA under an Emergency Use Authorization (EUA). This EUA will remain  in effect (meaning this test can be used) for the duration of the COVID-19 declaration under Section 564(b)(1) of the Act, 21 U.S.C. section 360bbb-3(b)(1), unless the authorization is terminated or revoked sooner.    Influenza A by PCR NEGATIVE NEGATIVE Final   Influenza B by PCR NEGATIVE NEGATIVE Final    Comment: (NOTE) The Xpert Xpress SARS-CoV-2/FLU/RSV assay is intended as an aid in  the diagnosis of influenza from Nasopharyngeal swab specimens and  should not be used as a sole basis for treatment. Nasal washings and  aspirates are unacceptable for Xpert Xpress SARS-CoV-2/FLU/RSV  testing. Fact Sheet for Patients: PinkCheek.be Fact Sheet for Healthcare Providers: GravelBags.it This test is not yet approved or cleared by the [place] FDA and  has been authorized for detection and/or diagnosis of SARS-CoV-2 by  FDA under an Emergency Use Authorization (EUA). This EUA will remain  in effect (meaning this test can be used) for the duration of the  Covid-19 declaration under Section 564(b)(1) of the Act, 21  U.S.C. section 360bbb-3(b)(1), unless the authorization is  terminated or revoked. Performed at [facility], [place] [address], [place], [place] [address]       Studies: No results found.  Scheduled Meds: . aspirin EC  81 mg Oral Daily  . bisacodyl  10 mg Rectal Once  . brimonidine  1 drop Left  Eye BID  . carvedilol  25 mg Oral BID WC  . Chlorhexidine Gluconate Cloth  6 each Topical Daily  . dorzolamide-timolol  1 drop Left Eye BID  . [START ON 10/20/2019] furosemide  80 mg Intravenous Daily  . gabapentin  100 mg Oral TID  . gatifloxacin  1 drop Left Eye QID  . heparin  5,000 Units Subcutaneous Q8H  . hydrALAZINE  25 mg Oral TID  . insulin aspart  0-9 Units Subcutaneous TID WC  . latanoprost  1 drop Left Eye QHS  . senna-docusate  1 tablet Oral BID  . sodium bicarbonate  650 mg Oral Daily    Continuous Infusions: . sodium chloride 250 mL (10/16/19 0909)     LOS: 4 days     Alma Friendly, MD [facility]  If 7PM-7AM, please contact night-coverage [email or web address] 10/19/2019, 4:46 PM

## 2019-10-19 NOTE — Progress Notes (Signed)
Physical Therapy Treatment Patient Details Name: Christopher Davila MRN: [id number] DOB: 1970/09/19 Today's Date: 10/19/2019    History of Present Illness Pt is an 49 y.o. male with PMH DM, HTN, CKD stage IIIb, peripheral neuropathy, diabetic retinopathy, bilateral vision loss, glaucoma, chronic diastolic heart failure who presented to ED with increased weakness.    PT Comments    Pt is finishing up lunch on entry with help of NT. Pt agreeable to getting up to brush teeth at sink. Pt is making progress towards his goals however continues to be limited in safe mobility by decreased safety with mobility due to visual deficits in the presence of decreased strength and balance. Pt is currently min guard for bed mobility, min A for transfers and min physical assist for ambulation of 50 feet with RW. Pt requires maximal multimodal cues for successful management of RW around obstacles in room. Given pt's continued weakness and instability PT continues to recommend SNF level rehab at discharge, however because he does not qualify so pt will need HHPT and supervision for mobility. PT will continue to follow acutely.     Follow Up Recommendations  Home health PT;Supervision for mobility/OOB     Equipment Recommendations  3in1 (PT)       Precautions / Restrictions Precautions Precautions: Fall;Other (comment) Precaution Comments: blind Restrictions Weight Bearing Restrictions: No    Mobility  Bed Mobility Overal bed mobility: Needs Assistance Bed Mobility: Supine to Sit     Supine to sit: Min guard     General bed mobility comments: minguardA for facilitation/activation of LB tasks  Transfers Overall transfer level: Needs assistance Equipment used: Ambulation equipment used Transfers: Sit to/from Stand Sit to Stand: Min assist;Min guard;From elevated surface         General transfer comment: minA for sit to stand from bed; multiple sit to stands fromstedy with  minguardA  Ambulation/Gait Ambulation/Gait assistance: Min assist;Max assist Gait Distance (Feet): 50 Feet Assistive device: Rolling walker (2 wheeled) Gait Pattern/deviations: Step-through pattern;Wide base of support;Trunk flexed Gait velocity: too fast for his level of stability Gait velocity interpretation: <1.8 ft/sec, indicate of risk for recurrent falls General Gait Details: requires maximal multimodal cues for navigation with RW, as well as min A for steadying          Balance Overall balance assessment: Needs assistance Sitting-balance support: No upper extremity supported;Feet supported Sitting balance-Leahy Scale: Good     Standing balance support: Single extremity supported;No upper extremity supported;During functional activity Standing balance-Leahy Scale: Fair Standing balance comment: Pt at times not holding on at all to counter.                             Cognition Arousal/Alertness: Awake/alert Behavior During Therapy: Flat affect Overall Cognitive Status: Within Functional Limits for tasks assessed Area of Impairment: Safety/judgement;Memory;Problem solving                     Memory: Decreased short-term memory   Safety/Judgement: Decreased awareness of deficits   Problem Solving: Slow processing;Requires verbal cues General Comments: Pt able to follow simple commands, but often requires multimodal cues to perform tasks; "go right" and pt continues to step forward into wall.      Exercises      General Comments General comments (skin integrity, edema, etc.): 4L O2 required as pt desats <88% O2 on RA. pt ambulating in hallway with chair follow. Pt reports weakness in BLEs and  SOB. O2 >94% on 4L.      Pertinent Vitals/Pain Pain Assessment: Faces Faces Pain Scale: Hurts a little bit Pain Location: bilateral LE Pain Descriptors / Indicators: Sore Pain Intervention(s): Limited activity within patient's tolerance;Monitored during  session;Repositioned           PT Goals (current goals can now be found in the care plan section) Acute Rehab PT Goals Patient Stated Goal: get stronger PT Goal Formulation: With patient Time For Goal Achievement: 10/28/19 Potential to Achieve Goals: Good Progress towards PT goals: Progressing toward goals    Frequency    Min 3X/week      PT Plan Frequency needs to be updated;Current plan remains appropriate       AM-PAC PT "6 Clicks" Mobility   Outcome Measure  Help needed turning from your back to your side while in a flat bed without using bedrails?: A Little Help needed moving from lying on your back to sitting on the side of a flat bed without using bedrails?: A Little Help needed moving to and from a bed to a chair (including a wheelchair)?: A Lot Help needed standing up from a chair using your arms (e.g., wheelchair or bedside chair)?: A Lot Help needed to walk in hospital room?: A Little Help needed climbing 3-5 steps with a railing? : Total 6 Click Score: 14    End of Session Equipment Utilized During Treatment: Gait belt;Oxygen Activity Tolerance: Patient tolerated treatment well Patient left: with call bell/phone within reach;in chair;with chair alarm set Nurse Communication: Mobility status PT Visit Diagnosis: Unsteadiness on feet (R26.81);Muscle weakness (generalized) (M62.81);History of falling (Z91.81)     Time: 5400-8676 PT Time Calculation (min) (ACUTE ONLY): 28 min  Charges:  $Gait Training: 8-22 mins                     Cereniti Curb B. Migdalia Dk PT, DPT Acute Rehabilitation Services Pager [phone] Office [phone]    [place] 10/19/2019, 4:19 PM

## 2019-10-20 LAB — GLUCOSE, CAPILLARY
Glucose-Capillary: 115 mg/dL — ABNORMAL HIGH (ref 70–99)
Glucose-Capillary: 154 mg/dL — ABNORMAL HIGH (ref 70–99)
Glucose-Capillary: 136 mg/dL — ABNORMAL HIGH (ref 70–99)
Glucose-Capillary: 171 mg/dL — ABNORMAL HIGH (ref 70–99)

## 2019-10-20 LAB — RENAL FUNCTION PANEL
Albumin: 2.3 g/dL — ABNORMAL LOW (ref 3.5–5.0)
Anion gap: 11 (ref 5–15)
BUN: 62 mg/dL — ABNORMAL HIGH (ref 6–20)
CO2: 22 mmol/L (ref 22–32)
Calcium: 8.8 mg/dL — ABNORMAL LOW (ref 8.9–10.3)
Chloride: 105 mmol/L (ref 98–111)
Creatinine, Ser: 3.75 mg/dL — ABNORMAL HIGH (ref 0.61–1.24)
GFR calc Af Amer: 21 mL/min — ABNORMAL LOW (ref >60)
GFR calc non Af Amer: 18 mL/min — ABNORMAL LOW (ref >60)
Glucose, Bld: 121 mg/dL — ABNORMAL HIGH (ref 70–99)
Phosphorus: 6.6 mg/dL — ABNORMAL HIGH (ref 2.5–4.6)
Potassium: 4.1 mmol/L (ref 3.5–5.1)
Sodium: 138 mmol/L (ref 135–145)

## 2019-10-20 LAB — CBC WITH DIFFERENTIAL/PLATELET
Abs Immature Granulocytes: 0.2 10*3/uL — ABNORMAL HIGH (ref 0.00–0.07)
Basophils Absolute: 0 10*3/uL (ref 0.0–0.1)
Basophils Relative: 0 %
Eosinophils Absolute: 0.3 10*3/uL (ref 0.0–0.5)
Eosinophils Relative: 4 %
HCT: 23.8 % — ABNORMAL LOW (ref 39.0–52.0)
Hemoglobin: 7.2 g/dL — ABNORMAL LOW (ref 13.0–17.0)
Immature Granulocytes: 3 %
Lymphocytes Relative: 13 %
Lymphs Abs: 0.9 10*3/uL (ref 0.7–4.0)
MCH: 28 pg (ref 26.0–34.0)
MCHC: 30.3 g/dL (ref 30.0–36.0)
MCV: 92.6 fL (ref 80.0–100.0)
Monocytes Absolute: 1 10*3/uL (ref 0.1–1.0)
Monocytes Relative: 15 %
Neutro Abs: 4.5 10*3/uL (ref 1.7–7.7)
Neutrophils Relative %: 65 %
Platelets: 212 10*3/uL (ref 150–400)
RBC: 2.57 MIL/uL — ABNORMAL LOW (ref 4.22–5.81)
RDW: 14.9 % (ref 11.5–15.5)
WBC: 6.9 10*3/uL (ref 4.0–10.5)
nRBC: 0 % (ref 0.0–0.2)

## 2019-10-20 MED ORDER — FUROSEMIDE 10 MG/ML IJ SOLN
80.0000 mg | Freq: Two times a day (BID) | INTRAMUSCULAR | Status: DC
  Administered 2019-10-20 – 2019-10-23 (×6): 80 mg via INTRAVENOUS
  Filled 2019-10-20 (×6): qty 8

## 2019-10-20 NOTE — Progress Notes (Signed)
Patient ID: Christopher Davila, male   DOB: 1970/08/07, 49 y.o.   MRN: [id number] S: No new complaints. O:BP 127/85 (BP Location: Left Arm)   Pulse 78   Temp 97.6 F (36.4 C) (Oral)   Resp 18   Ht 5\' 3"  (1.6 m)   Wt 76.2 kg   SpO2 100%   BMI 29.76 kg/m   Intake/Output Summary (Last 24 hours) at 10/20/2019 1031 Last data filed at 10/20/2019 0834 Gross per 24 hour  Intake 960 ml  Output 1075 ml  Net -115 ml   Intake/Output: I/O last 3 completed shifts: In: 1200 [P.O.:1200] Out: 2175 [Urine:2175]  Intake/Output this shift:  Total I/O In: 240 [P.O.:240] Out: -  Weight change: -0.907 kg Gen: NAD CVS: no rub Resp: CTA Abd: +BS, soft, NT/ND Ext: 2+ edema  Recent Labs  Lab 10/14/19 0336 10/15/19 0444 10/16/19 0316 10/17/19 0524 10/18/19 0523 10/19/19 0427 10/20/19 0529  NA 139 137 139 139 140 136 138  K 4.8 4.8 4.8 4.2 4.1 3.8 4.1  CL 110 107 108 107 107 104 105  CO2 21* 21* 20* 22 23 24 22   GLUCOSE 91 94 95 163* 113* 141* 121*  BUN 58* 63* 66* 62* 58* 61* 62*  CREATININE 3.64* 4.04* 4.01* 3.89* 3.69* 3.55* 3.75*  ALBUMIN  --   --   --   --   --   --  2.3*  CALCIUM 8.6* 8.0* 8.3* 8.9 9.1 8.7* 8.8*  PHOS  --   --   --   --  6.5*  --  6.6*   Liver Function Tests: Recent Labs  Lab 10/20/19 0529  ALBUMIN 2.3*   No results for input(s): LIPASE, AMYLASE in the last 168 hours. No results for input(s): AMMONIA in the last 168 hours. CBC: Recent Labs  Lab 10/16/19 0316 10/16/19 0316 10/17/19 0524 10/17/19 0524 10/18/19 0523 10/19/19 0427 10/20/19 0529  WBC 6.6   < > 7.1   < > 7.9 6.2 6.9  NEUTROABS 4.3   < > 5.0   < > 6.0 4.2 4.5  HGB 7.4*   < > 7.8*   < > 7.9* 7.2* 7.2*  HCT 23.9*   < > 25.3*   < > 25.4* 23.5* 23.8*  MCV 88.8  --  88.8  --  89.1 90.4 92.6  PLT 273   < > 270   < > 256 225 212   < > = values in this interval not displayed.   Cardiac Enzymes: No results for input(s): CKTOTAL, CKMB, CKMBINDEX, TROPONINI in the last 168 hours. CBG: Recent Labs  Lab  10/19/19 0619 10/19/19 1138 10/19/19 1615 10/19/19 2122 10/20/19 0554  GLUCAP 136* 211* 240* 247* 115*    Iron Studies: No results for input(s): IRON, TIBC, TRANSFERRIN, FERRITIN in the last 72 hours. Studies/Results: No results found. Marland Kitchen aspirin EC  81 mg Oral Daily  . bisacodyl  10 mg Rectal Once  . brimonidine  1 drop Left Eye BID  . carvedilol  25 mg Oral BID WC  . Chlorhexidine Gluconate Cloth  6 each Topical Daily  . dorzolamide-timolol  1 drop Left Eye BID  . furosemide  80 mg Intravenous Daily  . gabapentin  100 mg Oral TID  . gatifloxacin  1 drop Left Eye QID  . heparin  5,000 Units Subcutaneous Q8H  . hydrALAZINE  25 mg Oral TID  . insulin aspart  0-9 Units Subcutaneous TID WC  . latanoprost  1 drop Left Eye QHS  .  senna-docusate  1 tablet Oral BID  . sodium bicarbonate  650 mg Oral Daily    BMET    Component Value Date/Time   NA 138 10/20/2019 0529   K 4.1 10/20/2019 0529   CL 105 10/20/2019 0529   CO2 22 10/20/2019 0529   GLUCOSE 121 (H) 10/20/2019 0529   BUN 62 (H) 10/20/2019 0529   CREATININE 3.75 (H) 10/20/2019 0529   CALCIUM 8.8 (L) 10/20/2019 0529   GFRNONAA 18 (L) 10/20/2019 0529   GFRAA 21 (L) 10/20/2019 0529   CBC    Component Value Date/Time   WBC 6.9 10/20/2019 0529   RBC 2.57 (L) 10/20/2019 0529   HGB 7.2 (L) 10/20/2019 0529   HCT 23.8 (L) 10/20/2019 0529   PLT 212 10/20/2019 0529   MCV 92.6 10/20/2019 0529   MCH 28.0 10/20/2019 0529   MCHC 30.3 10/20/2019 0529   RDW 14.9 10/20/2019 0529   LYMPHSABS 0.9 10/20/2019 0529   MONOABS 1.0 10/20/2019 0529   EOSABS 0.3 10/20/2019 0529   BASOSABS 0.0 10/20/2019 0529    Assessment/Plan:  1. AKI/CKD stage IV- in setting of acute on chronic diastolic CHF and diuresis. His BUN/Cr have been slowly improving and has been diuresing well. His baseline is 3-3.5.  1. BUN/Cr up slightly with diuresis 2. Will need to f/u with Dr. Carolin Sicks after discharge. 2. Acute on chronic diastolic CHF-  improving with IV lasix 80 mg and metolazone 5 mg. Did not respond to po lasix 80 mg daily so will give IV lasix again today and then if he does well will try po lasix 80 mg bid and follow UOP and Scr.  3. Urinary retention- s/p foley catheter and will likely need to be discharged with this and follow up with Urology as an outpatient.  4. Anemia of CKD stage IV- hgb improving. Received IV feraheme on 5/1 and Aranesp 40 mcg on 4/30.  Will give second dose of feraheme on 10/23/19. 5. DM type 2 - per primary svc  Donetta Potts, MD [facility] [phone]

## 2019-10-20 NOTE — Progress Notes (Signed)
PROGRESS NOTE    Christopher Davila  ELF:[id number] DOB: 05/31/1971 DOA: 10/13/2019 PCP: Marliss Coots, NP    Brief Narrative:  49 y.o.malewith medical history significant ofHTN; DM with gastroparesis and blindness; stage 4 CKD; and chronic diastolic CHF presenting with diarrhea and weakness. He was last admitted from 4/10-12 for acute on chronic diastolic CHF. He returned to the ER on 4/20 with failure to thrive and weakness, but did not qualify for hospitalization at that time and is not able to be placed in SNF. He reports that he has been having diarrhea 1 day PTA. He wonders if he ate something that upset his stomach. No nausea or vomiting. No recent antibiotics other than eye drops. He has been having some problems with weakness in his legs and arms for a few days and it also happened last week. PT came to see him at home last week. In the ED, severe acidosis with gap. Giving Mag, bicarb, LR.  Patient admitted for further management  Assessment & Plan:   Principal Problem:   Diarrhea Active Problems:   Type 2 diabetes mellitus with hypoglycemia (HCC)   Essential hypertension   Hyperlipidemia   CKD (chronic kidney disease) stage 4, GFR 15-29 ml/min (HCC)   Weakness   Metabolic acidosis, increased anion gap   Acute urinary retention   Diarrhea Resolved C. difficile negative, stool panel negative Continued with supportive care  Acute on chronic diastolic HF Significant bilateral lower extremity edema/scrotal swelling Noted to have progressive peripheral edema BNP 633 Echo on 01/2019 showed EF of 55 to 60%, left ventricular diastolic Doppler parameters are consistent with pseudonormalization Due to CKD, nephrology was earlier consulted to assist in fluid management, appreciate recs Plan for increased lasix to 80mg  IV today Repeat bmet in AM  CKD stage IV/anion gap metabolic acidosis Creatinine near baseline Improved acidosis Nephrology following Continue  bicarb Repeat bmet in AM  Anemia of CKD Anemia panel iron 44, sats 14 Status post 1 dose of Feraheme on 10/16/2019 Remains hemodynamically stable Hgb stable  Hypertension BP presently stable Cont on current hydralazine, Coreg doses  Diabetes mellitus type 2 Episode of hypoglycemia on 10/13/2019 Continue SSI, Accu-Cheks, hypoglycemic protocol Will likely d/c Glucotrol at time of discharge  Generalized weakness/failure to thrive Patient lives with a cousin, who works and unable to care for him at home PT/OT, recommend SNF (not a candidate unless he can pay privately) TOC consulted-plan to DC home with home health services  Urinary retention Continue indwelling Foley catheter Recommend outpatient follow-up with Urology when discharged  Blindness Continue eyedrops as tolerated Awaiting to have surgery  DVT prophylaxis: heparin subq Code Status: Full Family Communication: Pt in room, family not at bedside  Status is: Inpatient  Remains inpatient appropriate because:Hemodynamically unstable and IV treatments appropriate due to intensity of illness or inability to take PO   Dispo: The patient is from: Home              Anticipated d/c is to: Home              Anticipated d/c date is: 2 days              Patient currently is not medically stable to d/c., when cleared by Nephrology  Consultants:   Nephrology  Procedures:   Antimicrobials: Anti-infectives (From admission, onward)   None       Subjective: Without complaints  Objective: Vitals:   10/20/19 0130 10/20/19 0429 10/20/19 0831 10/20/19 1041  BP:  134/82 127/85 129/81  Pulse:  79 78 74  Resp:  19 18 18   Temp:  98.2 F (36.8 C) 97.6 F (36.4 C) (!) 97.5 F (36.4 C)  TempSrc:  Oral Oral Oral  SpO2:  99% 100% 99%  Weight: 76.2 kg     Height:        Intake/Output Summary (Last 24 hours) at 10/20/2019 1408 Last data filed at 10/20/2019 1310 Gross per 24 hour  Intake 900 ml  Output 825 ml  Net  75 ml   Filed Weights   10/18/19 0601 10/19/19 0359 10/20/19 0130  Weight: 76.2 kg 77.1 kg 76.2 kg    Examination:  General exam: Appears calm and comfortable  Respiratory system: Clear to auscultation. Respiratory effort normal. Cardiovascular system: S1 & S2 heard, Regular Gastrointestinal system: Abdomen is nondistended, soft and nontender. No organomegaly or masses felt. Normal bowel sounds heard. Central nervous system: Alert and oriented. No focal neurological deficits. Extremities: Symmetric 5 x 5 power, BLE edema. Skin: No rashes, lesions  Psychiatry: Judgement and insight appear normal. Mood & affect appropriate.   Data Reviewed: I have personally reviewed following labs and imaging studies  CBC: Recent Labs  Lab 10/16/19 0316 10/17/19 0524 10/18/19 0523 10/19/19 0427 10/20/19 0529  WBC 6.6 7.1 7.9 6.2 6.9  NEUTROABS 4.3 5.0 6.0 4.2 4.5  HGB 7.4* 7.8* 7.9* 7.2* 7.2*  HCT 23.9* 25.3* 25.4* 23.5* 23.8*  MCV 88.8 88.8 89.1 90.4 92.6  PLT 273 270 256 225 935   Basic Metabolic Panel: Recent Labs  Lab 10/16/19 0316 10/17/19 0524 10/18/19 0523 10/19/19 0427 10/20/19 0529  NA 139 139 140 136 138  K 4.8 4.2 4.1 3.8 4.1  CL 108 107 107 104 105  CO2 20* 22 23 24 22   GLUCOSE 95 163* 113* 141* 121*  BUN 66* 62* 58* 61* 62*  CREATININE 4.01* 3.89* 3.69* 3.55* 3.75*  CALCIUM 8.3* 8.9 9.1 8.7* 8.8*  PHOS  --   --  6.5*  --  6.6*   GFR: Estimated Creatinine Clearance: 22.3 mL/min (A) (by C-G formula based on SCr of 3.75 mg/dL (H)). Liver Function Tests: Recent Labs  Lab 10/20/19 0529  ALBUMIN 2.3*   No results for input(s): LIPASE, AMYLASE in the last 168 hours. No results for input(s): AMMONIA in the last 168 hours. Coagulation Profile: No results for input(s): INR, PROTIME in the last 168 hours. Cardiac Enzymes: No results for input(s): CKTOTAL, CKMB, CKMBINDEX, TROPONINI in the last 168 hours. BNP (last 3 results) No results for input(s): PROBNP in the  last 8760 hours. HbA1C: No results for input(s): HGBA1C in the last 72 hours. CBG: Recent Labs  Lab 10/19/19 1138 10/19/19 1615 10/19/19 2122 10/20/19 0554 10/20/19 1148  GLUCAP 211* 240* 247* 115* 154*   Lipid Profile: No results for input(s): CHOL, HDL, LDLCALC, TRIG, CHOLHDL, LDLDIRECT in the last 72 hours. Thyroid Function Tests: No results for input(s): TSH, T4TOTAL, FREET4, T3FREE, THYROIDAB in the last 72 hours. Anemia Panel: No results for input(s): VITAMINB12, FOLATE, FERRITIN, TIBC, IRON, RETICCTPCT in the last 72 hours. Sepsis Labs: No results for input(s): PROCALCITON, LATICACIDVEN in the last 168 hours.  Recent Results (from the past 240 hour(s))  Gastrointestinal Panel by PCR , Stool     Status: None   Collection Time: 10/13/19  7:50 AM   Specimen: Stool  Result Value Ref Range Status   Campylobacter species NOT DETECTED NOT DETECTED Final   Plesimonas shigelloides NOT DETECTED NOT DETECTED Final  Salmonella species NOT DETECTED NOT DETECTED Final   Yersinia enterocolitica NOT DETECTED NOT DETECTED Final   Vibrio species NOT DETECTED NOT DETECTED Final   Vibrio cholerae NOT DETECTED NOT DETECTED Final   Enteroaggregative E coli (EAEC) NOT DETECTED NOT DETECTED Final   Enteropathogenic E coli (EPEC) NOT DETECTED NOT DETECTED Final   Enterotoxigenic E coli (ETEC) NOT DETECTED NOT DETECTED Final   Shiga like toxin producing E coli (STEC) NOT DETECTED NOT DETECTED Final   Shigella/Enteroinvasive E coli (EIEC) NOT DETECTED NOT DETECTED Final   Cryptosporidium NOT DETECTED NOT DETECTED Final   Cyclospora cayetanensis NOT DETECTED NOT DETECTED Final   Entamoeba histolytica NOT DETECTED NOT DETECTED Final   Giardia lamblia NOT DETECTED NOT DETECTED Final   Adenovirus F40/41 NOT DETECTED NOT DETECTED Final   Astrovirus NOT DETECTED NOT DETECTED Final   Norovirus GI/GII NOT DETECTED NOT DETECTED Final   Rotavirus A NOT DETECTED NOT DETECTED Final   Sapovirus (I, II,  IV, and V) NOT DETECTED NOT DETECTED Final    Comment: Performed at [facility], [address] [place], [place] [address]  C Difficile Quick Screen w PCR reflex     Status: None   Collection Time: 10/13/19  7:50 AM   Specimen: Stool  Result Value Ref Range Status   C Diff antigen NEGATIVE NEGATIVE Final   C Diff toxin NEGATIVE NEGATIVE Final   C Diff interpretation No C. difficile detected.  Final    Comment: Performed at [facility], [place] [address] [place], [place] [address]  Respiratory Panel by RT PCR (Flu A&B, Covid) - Nasopharyngeal Swab     Status: None   Collection Time: 10/13/19 10:19 AM   Specimen: Nasopharyngeal Swab  Result Value Ref Range Status   SARS Coronavirus 2 by RT PCR NEGATIVE NEGATIVE Final    Comment: (NOTE) SARS-CoV-2 target nucleic acids are NOT DETECTED. The SARS-CoV-2 RNA is generally detectable in upper respiratoy specimens during the acute phase of infection. The lowest concentration of SARS-CoV-2 viral copies this assay can detect is 131 copies/mL. A negative result does not preclude SARS-Cov-2 infection and should not be used as the sole basis for treatment or other patient management decisions. A negative result may occur with  improper specimen collection/handling, submission of specimen other than nasopharyngeal swab, presence of viral mutation(s) within the areas targeted by this assay, and inadequate number of viral copies (<131 copies/mL). A negative result must be combined with clinical observations, patient history, and epidemiological information. The expected result is Negative. Fact Sheet for Patients:  PinkCheek.be Fact Sheet for Healthcare Providers:  GravelBags.it This test is not yet ap proved or cleared by the [place] FDA and  has been authorized for detection and/or diagnosis of SARS-CoV-2 by FDA under an Emergency Use Authorization (EUA). This EUA will  remain  in effect (meaning this test can be used) for the duration of the COVID-19 declaration under Section 564(b)(1) of the Act, 21 U.S.C. section 360bbb-3(b)(1), unless the authorization is terminated or revoked sooner.    Influenza A by PCR NEGATIVE NEGATIVE Final   Influenza B by PCR NEGATIVE NEGATIVE Final    Comment: (NOTE) The Xpert Xpress SARS-CoV-2/FLU/RSV assay is intended as an aid in  the diagnosis of influenza from Nasopharyngeal swab specimens and  should not be used as a sole basis for treatment. Nasal washings and  aspirates are unacceptable for Xpert Xpress SARS-CoV-2/FLU/RSV  testing. Fact Sheet for Patients: PinkCheek.be Fact Sheet for Healthcare Providers:  GravelBags.it This test is not yet approved or cleared by the [place] and  has been authorized for detection and/or diagnosis of SARS-CoV-2 by  FDA under an Emergency Use Authorization (EUA). This EUA will remain  in effect (meaning this test can be used) for the duration of the  Covid-19 declaration under Section 564(b)(1) of the Act, 21  U.S.C. section 360bbb-3(b)(1), unless the authorization is  terminated or revoked. Performed at [facility], [place] [address] [place], [place] [address]      Radiology Studies: No results found.  Scheduled Meds: . aspirin EC  81 mg Oral Daily  . bisacodyl  10 mg Rectal Once  . brimonidine  1 drop Left Eye BID  . carvedilol  25 mg Oral BID WC  . Chlorhexidine Gluconate Cloth  6 each Topical Daily  . dorzolamide-timolol  1 drop Left Eye BID  . furosemide  80 mg Intravenous Daily  . gabapentin  100 mg Oral TID  . gatifloxacin  1 drop Left Eye QID  . heparin  5,000 Units Subcutaneous Q8H  . hydrALAZINE  25 mg Oral TID  . insulin aspart  0-9 Units Subcutaneous TID WC  . latanoprost  1 drop Left Eye QHS  . senna-docusate  1 tablet Oral BID  . sodium bicarbonate  650 mg Oral Daily   Continuous  Infusions: . sodium chloride 250 mL (10/16/19 0909)     LOS: 5 days   Marylu Lund, MD [facility] Pager On Amion  If 7PM-7AM, please contact night-coverage 10/20/2019, 2:08 PM

## 2019-10-21 LAB — CBC WITH DIFFERENTIAL/PLATELET
Abs Immature Granulocytes: 0.15 10*3/uL — ABNORMAL HIGH (ref 0.00–0.07)
Basophils Absolute: 0 10*3/uL (ref 0.0–0.1)
Basophils Relative: 0 %
Eosinophils Absolute: 0.3 10*3/uL (ref 0.0–0.5)
Eosinophils Relative: 4 %
HCT: 24.4 % — ABNORMAL LOW (ref 39.0–52.0)
Hemoglobin: 7.3 g/dL — ABNORMAL LOW (ref 13.0–17.0)
Immature Granulocytes: 2 %
Lymphocytes Relative: 14 %
Lymphs Abs: 1 10*3/uL (ref 0.7–4.0)
MCH: 27.8 pg (ref 26.0–34.0)
MCHC: 29.9 g/dL — ABNORMAL LOW (ref 30.0–36.0)
MCV: 92.8 fL (ref 80.0–100.0)
Monocytes Absolute: 1.2 10*3/uL — ABNORMAL HIGH (ref 0.1–1.0)
Monocytes Relative: 16 %
Neutro Abs: 4.7 10*3/uL (ref 1.7–7.7)
Neutrophils Relative %: 64 %
Platelets: 193 10*3/uL (ref 150–400)
RBC: 2.63 MIL/uL — ABNORMAL LOW (ref 4.22–5.81)
RDW: 15 % (ref 11.5–15.5)
WBC: 7.3 10*3/uL (ref 4.0–10.5)
nRBC: 0 % (ref 0.0–0.2)

## 2019-10-21 LAB — GLUCOSE, CAPILLARY
Glucose-Capillary: 132 mg/dL — ABNORMAL HIGH (ref 70–99)
Glucose-Capillary: 164 mg/dL — ABNORMAL HIGH (ref 70–99)
Glucose-Capillary: 149 mg/dL — ABNORMAL HIGH (ref 70–99)
Glucose-Capillary: 309 mg/dL — ABNORMAL HIGH (ref 70–99)
Glucose-Capillary: 218 mg/dL — ABNORMAL HIGH (ref 70–99)

## 2019-10-21 LAB — RENAL FUNCTION PANEL
Albumin: 2.3 g/dL — ABNORMAL LOW (ref 3.5–5.0)
Anion gap: 10 (ref 5–15)
BUN: 66 mg/dL — ABNORMAL HIGH (ref 6–20)
CO2: 24 mmol/L (ref 22–32)
Calcium: 8.7 mg/dL — ABNORMAL LOW (ref 8.9–10.3)
Chloride: 102 mmol/L (ref 98–111)
Creatinine, Ser: 3.84 mg/dL — ABNORMAL HIGH (ref 0.61–1.24)
GFR calc Af Amer: 20 mL/min — ABNORMAL LOW (ref >60)
GFR calc non Af Amer: 18 mL/min — ABNORMAL LOW (ref >60)
Glucose, Bld: 159 mg/dL — ABNORMAL HIGH (ref 70–99)
Phosphorus: 6.5 mg/dL — ABNORMAL HIGH (ref 2.5–4.6)
Potassium: 4.1 mmol/L (ref 3.5–5.1)
Sodium: 136 mmol/L (ref 135–145)

## 2019-10-21 NOTE — Progress Notes (Signed)
Patient ID: Christopher Davila, male   DOB: 07/28/1970, 49 y.o.   MRN: [id number] S: No complaints. O:BP 138/86 (BP Location: Left Arm)   Pulse 75   Temp 97.7 F (36.5 C) (Oral)   Resp 18   Ht 5\' 3"  (1.6 m)   Wt 76.7 kg   SpO2 100%   BMI 29.94 kg/m   Intake/Output Summary (Last 24 hours) at 10/21/2019 1035 Last data filed at 10/21/2019 0818 Gross per 24 hour  Intake 420 ml  Output 1401 ml  Net -981 ml   Intake/Output: I/O last 3 completed shifts: In: 780 [P.O.:780] Out: 1951 [Urine:1950; Stool:1]  Intake/Output this shift:  Total I/O In: 120 [P.O.:120] Out: -  Weight change: 0.454 kg Gen: NAD CVS: no rub Resp: cta Abd: +BS< soft, nt/nd Ext: 2+ lower extremity edema  Recent Labs  Lab 10/15/19 0444 10/16/19 0316 10/17/19 0524 10/18/19 0523 10/19/19 0427 10/20/19 0529 10/21/19 0417  NA 137 139 139 140 136 138 136  K 4.8 4.8 4.2 4.1 3.8 4.1 4.1  CL 107 108 107 107 104 105 102  CO2 21* 20* 22 23 24 22 24   GLUCOSE 94 95 163* 113* 141* 121* 159*  BUN 63* 66* 62* 58* 61* 62* 66*  CREATININE 4.04* 4.01* 3.89* 3.69* 3.55* 3.75* 3.84*  ALBUMIN  --   --   --   --   --  2.3* 2.3*  CALCIUM 8.0* 8.3* 8.9 9.1 8.7* 8.8* 8.7*  PHOS  --   --   --  6.5*  --  6.6* 6.5*   Liver Function Tests: Recent Labs  Lab 10/20/19 0529 10/21/19 0417  ALBUMIN 2.3* 2.3*   No results for input(s): LIPASE, AMYLASE in the last 168 hours. No results for input(s): AMMONIA in the last 168 hours. CBC: Recent Labs  Lab 10/17/19 0524 10/17/19 0524 10/18/19 0523 10/18/19 0523 10/19/19 0427 10/20/19 0529 10/21/19 0417  WBC 7.1   < > 7.9   < > 6.2 6.9 7.3  NEUTROABS 5.0   < > 6.0   < > 4.2 4.5 4.7  HGB 7.8*   < > 7.9*   < > 7.2* 7.2* 7.3*  HCT 25.3*   < > 25.4*   < > 23.5* 23.8* 24.4*  MCV 88.8  --  89.1  --  90.4 92.6 92.8  PLT 270   < > 256   < > 225 212 193   < > = values in this interval not displayed.   Cardiac Enzymes: No results for input(s): CKTOTAL, CKMB, CKMBINDEX, TROPONINI in the  last 168 hours. CBG: Recent Labs  Lab 10/20/19 0554 10/20/19 1148 10/20/19 1627 10/20/19 2111 10/21/19 0606  GLUCAP 115* 154* 136* 171* 132*    Iron Studies: No results for input(s): IRON, TIBC, TRANSFERRIN, FERRITIN in the last 72 hours. Studies/Results: No results found. Marland Kitchen aspirin EC  81 mg Oral Daily  . bisacodyl  10 mg Rectal Once  . brimonidine  1 drop Left Eye BID  . carvedilol  25 mg Oral BID WC  . Chlorhexidine Gluconate Cloth  6 each Topical Daily  . dorzolamide-timolol  1 drop Left Eye BID  . furosemide  80 mg Intravenous BID  . gabapentin  100 mg Oral TID  . gatifloxacin  1 drop Left Eye QID  . heparin  5,000 Units Subcutaneous Q8H  . hydrALAZINE  25 mg Oral TID  . insulin aspart  0-9 Units Subcutaneous TID WC  . latanoprost  1 drop Left Eye  QHS  . senna-docusate  1 tablet Oral BID  . sodium bicarbonate  650 mg Oral Daily    BMET    Component Value Date/Time   NA 136 10/21/2019 0417   K 4.1 10/21/2019 0417   CL 102 10/21/2019 0417   CO2 24 10/21/2019 0417   GLUCOSE 159 (H) 10/21/2019 0417   BUN 66 (H) 10/21/2019 0417   CREATININE 3.84 (H) 10/21/2019 0417   CALCIUM 8.7 (L) 10/21/2019 0417   GFRNONAA 18 (L) 10/21/2019 0417   GFRAA 20 (L) 10/21/2019 0417   CBC    Component Value Date/Time   WBC 7.3 10/21/2019 0417   RBC 2.63 (L) 10/21/2019 0417   HGB 7.3 (L) 10/21/2019 0417   HCT 24.4 (L) 10/21/2019 0417   PLT 193 10/21/2019 0417   MCV 92.8 10/21/2019 0417   MCH 27.8 10/21/2019 0417   MCHC 29.9 (L) 10/21/2019 0417   RDW 15.0 10/21/2019 0417   LYMPHSABS 1.0 10/21/2019 0417   MONOABS 1.2 (H) 10/21/2019 0417   EOSABS 0.3 10/21/2019 0417   BASOSABS 0.0 10/21/2019 0417     Assessment/Plan:  1. AKI/CKD stage IV- in setting of acute on chronic diastolic CHF and diuresis. His baseline is 3-3.5.  1. BUN/Cr up slightly with diuresis but still with significant volume overload. 2. Will need to f/u with Dr. Carolin Sicks after discharge. 2. Acute on  chronic diastolic CHF- improving with IV lasix 80 mg and metolazone 5 mg.Did not respond topo lasix 80 mg dailyso currently on IV lasix 80 mg bid.  Continue with this dose for now. 3. Urinary retention- s/p foley catheter and will likely need to be discharged with this and follow up with Urology as an outpatient.  4. Anemia of CKD stage IV- hgb improving. Received IV feraheme on 5/1 and Aranesp 40 mcg on 4/30.  Will give second dose of feraheme on 10/23/19. 5. DM type 2 - per primary svc  Donetta Potts, MD [facility] [phone]

## 2019-10-21 NOTE — Progress Notes (Signed)
Physical Therapy Treatment Patient Details Name: Christopher Davila MRN: [id number] DOB: [birth year] Today's Date: 10/21/2019    History of Present Illness Pt is an 49 y.o. male with PMH DM, HTN, CKD stage IIIb, peripheral neuropathy, diabetic retinopathy, bilateral vision loss, glaucoma, chronic diastolic heart failure who presented to ED with increased weakness.    PT Comments    Pt requesting to get to [facility] on entry. Pt requires minA for bed mobility, sit>stand from bed and BSC and min physical assist and maximal multimodal cues for navigation of his environment with RW. D/c plans remain appropriate at this time. PT will continue to follow acutely.    Follow Up Recommendations  Home health PT;Supervision for mobility/OOB     Equipment Recommendations  3in1 (PT)       Precautions / Restrictions Precautions Precautions: Fall;Other (comment) Precaution Comments: blind Restrictions Weight Bearing Restrictions: No    Mobility  Bed Mobility Overal bed mobility: Needs Assistance Bed Mobility: Supine to Sit     Supine to sit: Min assist     General bed mobility comments: requires assist for guiding had to bed rail, pt able to move B LE to EoB, and min A for trunk to upright  Transfers Overall transfer level: Needs assistance Equipment used: Rolling walker (2 wheeled) Transfers: Sit to/from Stand Sit to Stand: Min assist;From elevated surface         General transfer comment: minA for sit>stand from bed surface and from [facility], multimodal cues for hand placement   Ambulation/Gait Ambulation/Gait assistance: Min assist;Max assist Gait Distance (Feet): 20 Feet Assistive device: Rolling walker (2 wheeled) Gait Pattern/deviations: Step-through pattern;Wide base of support;Trunk flexed Gait velocity: too fast for his level of stability Gait velocity interpretation: <1.8 ft/sec, indicate of risk for recurrent falls General Gait Details: continues to require maximal assist for  navigation in envirionment, with minimal physical assist for steadying with RW          Balance Overall balance assessment: Needs assistance Sitting-balance support: No upper extremity supported;Feet supported Sitting balance-Leahy Scale: Good     Standing balance support: Single extremity supported;No upper extremity supported;During functional activity Standing balance-Leahy Scale: Fair                              Cognition Arousal/Alertness: Awake/alert Behavior During Therapy: Flat affect Overall Cognitive Status: Within Functional Limits for tasks assessed Area of Impairment: Problem solving                             Problem Solving: Slow processing;Requires verbal cues;Requires tactile cues General Comments: continues to need extra time for processing          General Comments General comments (skin integrity, edema, etc.): VSS on 3L O2 via [place]      Pertinent Vitals/Pain Pain Assessment: No/denies pain           PT Goals (current goals can now be found in the care plan section) Acute Rehab PT Goals Patient Stated Goal: get stronger PT Goal Formulation: With patient Time For Goal Achievement: 10/28/19 Potential to Achieve Goals: Good Progress towards PT goals: Progressing toward goals    Frequency    Min 3X/week      PT Plan Frequency needs to be updated;Current plan remains appropriate       AM-PAC PT "6 Clicks" Mobility   Outcome Measure  Help needed turning from your back  to your side while in a flat bed without using bedrails?: A Little Help needed moving from lying on your back to sitting on the side of a flat bed without using bedrails?: A Little Help needed moving to and from a bed to a chair (including a wheelchair)?: A Lot Help needed standing up from a chair using your arms (e.g., wheelchair or bedside chair)?: A Lot Help needed to walk in hospital room?: A Little Help needed climbing 3-5 steps with a railing?  : Total 6 Click Score: 14    End of Session Equipment Utilized During Treatment: Gait belt;Oxygen Activity Tolerance: Patient tolerated treatment well Patient left: with call bell/phone within reach;in chair;with chair alarm set Nurse Communication: Mobility status PT Visit Diagnosis: Unsteadiness on feet (R26.81);Muscle weakness (generalized) (M62.81);History of falling (Z91.81)     Time: 1610-9604 PT Time Calculation (min) (ACUTE ONLY): 31 min  Charges:  $Gait Training: 8-22 mins $Therapeutic Activity: 8-22 mins                     Zarin Hagmann B. Migdalia Dk PT, DPT Acute Rehabilitation Services Pager [phone] Office [phone]    Walker 10/21/2019, 12:07 PM

## 2019-10-21 NOTE — Progress Notes (Signed)
PROGRESS NOTE    Christopher Davila  GYJ:[id number] DOB: [birth year] DOA: 10/13/2019 PCP: Marliss Coots, NP    Brief Narrative:  49 y.o.malewith medical history significant ofHTN; DM with gastroparesis and blindness; stage 4 CKD; and chronic diastolic CHF presenting with diarrhea and weakness. He was last admitted from 4/10-12 for acute on chronic diastolic CHF. He returned to the ER on 4/20 with failure to thrive and weakness, but did not qualify for hospitalization at that time and is not able to be placed in SNF. He reports that he has been having diarrhea 1 day PTA. He wonders if he ate something that upset his stomach. No nausea or vomiting. No recent antibiotics other than eye drops. He has been having some problems with weakness in his legs and arms for a few days and it also happened last week. PT came to see him at home last week. In the ED, severe acidosis with gap. Giving Mag, bicarb, LR.  Patient admitted for further management  Assessment & Plan:   Principal Problem:   Diarrhea Active Problems:   Type 2 diabetes mellitus with hypoglycemia (HCC)   Essential hypertension   Hyperlipidemia   CKD (chronic kidney disease) stage 4, GFR 15-29 ml/min (HCC)   Weakness   Metabolic acidosis, increased anion gap   Acute urinary retention   Diarrhea Resolved C. difficile negative, stool panel negative Seems stable. One BM documented this AM  Acute on chronic diastolic HF Significant bilateral lower extremity edema/scrotal swelling Noted to have progressive peripheral edema BNP 633 Echo on 01/2019 showed EF of 55 to 60%, left ventricular diastolic Doppler parameters are consistent with pseudonormalization Due to CKD, nephrology was earlier consulted to assist in fluid management, appreciate recs Plan to continue on lasix 80mg  IV BID Repeat bmet in AM  CKD stage IV/anion gap metabolic acidosis Creatinine near baseline Improved acidosis Nephrology following Continue  bicarb Recheck bmet in AM  Anemia of CKD Anemia panel iron 44, sats 14 Status post 1 dose of Feraheme on 10/16/2019 Remains hemodynamically stable Hgb has remained stable  Hypertension BP currently stable Cont on current hydralazine, Coreg doses  Diabetes mellitus type 2 Episode of hypoglycemia on 10/13/2019 Continue SSI, Accu-Cheks, hypoglycemic protocol Anticipate stopping Glucotrol at time of discharge  Generalized weakness/failure to thrive Patient lives with a cousin, who works and unable to care for him at home PT/OT, recommend SNF (not a candidate unless he can pay privately) TOC consulted-plan to DC home with home health services when clear for d/c  Urinary retention Continue indwelling Foley catheter Recommend outpatient follow-up with Urology when pt is discharged  Blindness Continue eyedrops as tolerated Awaiting to have surgery when discharged  DVT prophylaxis: heparin subq Code Status: Full Family Communication: Pt in room, family not at bedside  Status is: Inpatient  Remains inpatient appropriate because:Hemodynamically unstable and IV treatments appropriate due to intensity of illness or inability to take PO, still needs IV lasix  Dispo: The patient is from: Home              Anticipated d/c is to: Home              Anticipated d/c date is: 2 days              Patient currently is not medically stable to d/c., when cleared by Nephrology  Consultants:   Nephrology  Procedures:   Antimicrobials: Anti-infectives (From admission, onward)   None      Subjective: Seen while pt eating  lunch. Without complaints this AM  Objective: Vitals:   10/21/19 0154 10/21/19 0334 10/21/19 0816 10/21/19 1153  BP:  (!) 130/94 138/86 137/77  Pulse:  74 75 74  Resp:  18  18  Temp:  97.7 F (36.5 C)  98 F (36.7 C)  TempSrc:  Oral    SpO2:  100%  100%  Weight: 76.7 kg     Height:        Intake/Output Summary (Last 24 hours) at 10/21/2019 1306 Last  data filed at 10/21/2019 0820 Gross per 24 hour  Intake 540 ml  Output 1401 ml  Net -861 ml   Filed Weights   10/19/19 0359 10/20/19 0130 10/21/19 0154  Weight: 77.1 kg 76.2 kg 76.7 kg    Examination: General exam: Awake, laying in bed, in nad Respiratory system: Normal respiratory effort, no wheezing Cardiovascular system: regular rate, s1, s2 Gastrointestinal system: Soft, nondistended, positive BS Central nervous system: CN2-12 grossly intact, strength intact Extremities: Perfused, no clubbing Skin: Normal skin turgor, no notable skin lesions seen Psychiatry: Mood normal // no visual hallucinations   Data Reviewed: I have personally reviewed following labs and imaging studies  CBC: Recent Labs  Lab 10/17/19 0524 10/18/19 0523 10/19/19 0427 10/20/19 0529 10/21/19 0417  WBC 7.1 7.9 6.2 6.9 7.3  NEUTROABS 5.0 6.0 4.2 4.5 4.7  HGB 7.8* 7.9* 7.2* 7.2* 7.3*  HCT 25.3* 25.4* 23.5* 23.8* 24.4*  MCV 88.8 89.1 90.4 92.6 92.8  PLT 270 256 225 212 720   Basic Metabolic Panel: Recent Labs  Lab 10/17/19 0524 10/18/19 0523 10/19/19 0427 10/20/19 0529 10/21/19 0417  NA 139 140 136 138 136  K 4.2 4.1 3.8 4.1 4.1  CL 107 107 104 105 102  CO2 22 23 24 22 24   GLUCOSE 163* 113* 141* 121* 159*  BUN 62* 58* 61* 62* 66*  CREATININE 3.89* 3.69* 3.55* 3.75* 3.84*  CALCIUM 8.9 9.1 8.7* 8.8* 8.7*  PHOS  --  6.5*  --  6.6* 6.5*   GFR: Estimated Creatinine Clearance: 21.8 mL/min (A) (by C-G formula based on SCr of 3.84 mg/dL (H)). Liver Function Tests: Recent Labs  Lab 10/20/19 0529 10/21/19 0417  ALBUMIN 2.3* 2.3*   No results for input(s): LIPASE, AMYLASE in the last 168 hours. No results for input(s): AMMONIA in the last 168 hours. Coagulation Profile: No results for input(s): INR, PROTIME in the last 168 hours. Cardiac Enzymes: No results for input(s): CKTOTAL, CKMB, CKMBINDEX, TROPONINI in the last 168 hours. BNP (last 3 results) No results for input(s): PROBNP in the  last 8760 hours. HbA1C: No results for input(s): HGBA1C in the last 72 hours. CBG: Recent Labs  Lab 10/20/19 1148 10/20/19 1627 10/20/19 2111 10/21/19 0606 10/21/19 1057  GLUCAP 154* 136* 171* 132* 164*   Lipid Profile: No results for input(s): CHOL, HDL, LDLCALC, TRIG, CHOLHDL, LDLDIRECT in the last 72 hours. Thyroid Function Tests: No results for input(s): TSH, T4TOTAL, FREET4, T3FREE, THYROIDAB in the last 72 hours. Anemia Panel: No results for input(s): VITAMINB12, FOLATE, FERRITIN, TIBC, IRON, RETICCTPCT in the last 72 hours. Sepsis Labs: No results for input(s): PROCALCITON, LATICACIDVEN in the last 168 hours.  Recent Results (from the past 240 hour(s))  Gastrointestinal Panel by PCR , Stool     Status: None   Collection Time: 10/13/19  7:50 AM   Specimen: Stool  Result Value Ref Range Status   Campylobacter species NOT DETECTED NOT DETECTED Final   Plesimonas shigelloides NOT DETECTED NOT DETECTED Final  Salmonella species NOT DETECTED NOT DETECTED Final   Yersinia enterocolitica NOT DETECTED NOT DETECTED Final   Vibrio species NOT DETECTED NOT DETECTED Final   Vibrio cholerae NOT DETECTED NOT DETECTED Final   Enteroaggregative E coli (EAEC) NOT DETECTED NOT DETECTED Final   Enteropathogenic E coli (EPEC) NOT DETECTED NOT DETECTED Final   Enterotoxigenic E coli (ETEC) NOT DETECTED NOT DETECTED Final   Shiga like toxin producing E coli (STEC) NOT DETECTED NOT DETECTED Final   Shigella/Enteroinvasive E coli (EIEC) NOT DETECTED NOT DETECTED Final   Cryptosporidium NOT DETECTED NOT DETECTED Final   Cyclospora cayetanensis NOT DETECTED NOT DETECTED Final   Entamoeba histolytica NOT DETECTED NOT DETECTED Final   Giardia lamblia NOT DETECTED NOT DETECTED Final   Adenovirus F40/41 NOT DETECTED NOT DETECTED Final   Astrovirus NOT DETECTED NOT DETECTED Final   Norovirus GI/GII NOT DETECTED NOT DETECTED Final   Rotavirus A NOT DETECTED NOT DETECTED Final   Sapovirus (I, II,  IV, and V) NOT DETECTED NOT DETECTED Final    Comment: Performed at [facility], [address] [place], [place] [address]  C Difficile Quick Screen w PCR reflex     Status: None   Collection Time: 10/13/19  7:50 AM   Specimen: Stool  Result Value Ref Range Status   C Diff antigen NEGATIVE NEGATIVE Final   C Diff toxin NEGATIVE NEGATIVE Final   C Diff interpretation No C. difficile detected.  Final    Comment: Performed at [facility], [place] [address] [place],  [address]  Respiratory Panel by RT PCR (Flu A&B, Covid) - Nasopharyngeal Swab     Status: None   Collection Time: 10/13/19 10:19 AM   Specimen: Nasopharyngeal Swab  Result Value Ref Range Status   SARS Coronavirus 2 by RT PCR NEGATIVE NEGATIVE Final    Comment: (NOTE) SARS-CoV-2 target nucleic acids are NOT DETECTED. The SARS-CoV-2 RNA is generally detectable in upper respiratoy specimens during the acute phase of infection. The lowest concentration of SARS-CoV-2 viral copies this assay can detect is 131 copies/mL. A negative result does not preclude SARS-Cov-2 infection and should not be used as the sole basis for treatment or other patient management decisions. A negative result may occur with  improper specimen collection/handling, submission of specimen other than nasopharyngeal swab, presence of viral mutation(s) within the areas targeted by this assay, and inadequate number of viral copies (<131 copies/mL). A negative result must be combined with clinical observations, patient history, and epidemiological information. The expected result is Negative. Fact Sheet for Patients:  PinkCheek.be Fact Sheet for Healthcare Providers:  GravelBags.it This test is not yet ap proved or cleared by the [place] FDA and  has been authorized for detection and/or diagnosis of SARS-CoV-2 by FDA under an Emergency Use Authorization (EUA). This EUA will  remain  in effect (meaning this test can be used) for the duration of the COVID-19 declaration under Section 564(b)(1) of the Act, 21 U.S.C. section 360bbb-3(b)(1), unless the authorization is terminated or revoked sooner.    Influenza A by PCR NEGATIVE NEGATIVE Final   Influenza B by PCR NEGATIVE NEGATIVE Final    Comment: (NOTE) The Xpert Xpress SARS-CoV-2/FLU/RSV assay is intended as an aid in  the diagnosis of influenza from Nasopharyngeal swab specimens and  should not be used as a sole basis for treatment. Nasal washings and  aspirates are unacceptable for Xpert Xpress SARS-CoV-2/FLU/RSV  testing. Fact Sheet for Patients: PinkCheek.be Fact Sheet for Healthcare Providers:  GravelBags.it This test is not yet approved or cleared by the [place] and  has been authorized for detection and/or diagnosis of SARS-CoV-2 by  FDA under an Emergency Use Authorization (EUA). This EUA will remain  in effect (meaning this test can be used) for the duration of the  Covid-19 declaration under Section 564(b)(1) of the Act, 21  U.S.C. section 360bbb-3(b)(1), unless the authorization is  terminated or revoked. Performed at [facility], [place] [address] [place], [place] [address]      Radiology Studies: No results found.  Scheduled Meds: . aspirin EC  81 mg Oral Daily  . bisacodyl  10 mg Rectal Once  . brimonidine  1 drop Left Eye BID  . carvedilol  25 mg Oral BID WC  . Chlorhexidine Gluconate Cloth  6 each Topical Daily  . dorzolamide-timolol  1 drop Left Eye BID  . furosemide  80 mg Intravenous BID  . gabapentin  100 mg Oral TID  . gatifloxacin  1 drop Left Eye QID  . heparin  5,000 Units Subcutaneous Q8H  . hydrALAZINE  25 mg Oral TID  . insulin aspart  0-9 Units Subcutaneous TID WC  . latanoprost  1 drop Left Eye QHS  . senna-docusate  1 tablet Oral BID  . sodium bicarbonate  650 mg Oral Daily   Continuous  Infusions: . sodium chloride 250 mL (10/16/19 0909)     LOS: 6 days   Marylu Lund, MD [facility] Pager On Amion  If 7PM-7AM, please contact night-coverage 10/21/2019, 1:06 PM

## 2019-10-22 LAB — RENAL FUNCTION PANEL
Albumin: 2.3 g/dL — ABNORMAL LOW (ref 3.5–5.0)
Anion gap: 12 (ref 5–15)
BUN: 72 mg/dL — ABNORMAL HIGH (ref 6–20)
CO2: 23 mmol/L (ref 22–32)
Calcium: 8.8 mg/dL — ABNORMAL LOW (ref 8.9–10.3)
Chloride: 99 mmol/L (ref 98–111)
Creatinine, Ser: 4.04 mg/dL — ABNORMAL HIGH (ref 0.61–1.24)
GFR calc Af Amer: 19 mL/min — ABNORMAL LOW (ref >60)
GFR calc non Af Amer: 16 mL/min — ABNORMAL LOW (ref >60)
Glucose, Bld: 174 mg/dL — ABNORMAL HIGH (ref 70–99)
Phosphorus: 6.9 mg/dL — ABNORMAL HIGH (ref 2.5–4.6)
Potassium: 4 mmol/L (ref 3.5–5.1)
Sodium: 134 mmol/L — ABNORMAL LOW (ref 135–145)

## 2019-10-22 LAB — CBC WITH DIFFERENTIAL/PLATELET
Abs Immature Granulocytes: 0.09 10*3/uL — ABNORMAL HIGH (ref 0.00–0.07)
Basophils Absolute: 0 10*3/uL (ref 0.0–0.1)
Basophils Relative: 0 %
Eosinophils Absolute: 0.2 10*3/uL (ref 0.0–0.5)
Eosinophils Relative: 3 %
HCT: 23.2 % — ABNORMAL LOW (ref 39.0–52.0)
Hemoglobin: 6.8 g/dL — CL (ref 13.0–17.0)
Immature Granulocytes: 1 %
Lymphocytes Relative: 12 %
Lymphs Abs: 0.8 10*3/uL (ref 0.7–4.0)
MCH: 27 pg (ref 26.0–34.0)
MCHC: 29.3 g/dL — ABNORMAL LOW (ref 30.0–36.0)
MCV: 92.1 fL (ref 80.0–100.0)
Monocytes Absolute: 1.1 10*3/uL — ABNORMAL HIGH (ref 0.1–1.0)
Monocytes Relative: 16 %
Neutro Abs: 4.5 10*3/uL (ref 1.7–7.7)
Neutrophils Relative %: 68 %
Platelets: 187 10*3/uL (ref 150–400)
RBC: 2.52 MIL/uL — ABNORMAL LOW (ref 4.22–5.81)
RDW: 15.2 % (ref 11.5–15.5)
WBC: 6.6 10*3/uL (ref 4.0–10.5)
nRBC: 0 % (ref 0.0–0.2)

## 2019-10-22 LAB — GLUCOSE, CAPILLARY
Glucose-Capillary: 162 mg/dL — ABNORMAL HIGH (ref 70–99)
Glucose-Capillary: 161 mg/dL — ABNORMAL HIGH (ref 70–99)
Glucose-Capillary: 174 mg/dL — ABNORMAL HIGH (ref 70–99)
Glucose-Capillary: 193 mg/dL — ABNORMAL HIGH (ref 70–99)

## 2019-10-22 LAB — HEMOGLOBIN AND HEMATOCRIT, BLOOD
HCT: 27 % — ABNORMAL LOW (ref 39.0–52.0)
Hemoglobin: 8.3 g/dL — ABNORMAL LOW (ref 13.0–17.0)

## 2019-10-22 LAB — PREPARE RBC (CROSSMATCH)

## 2019-10-22 MED ORDER — FUROSEMIDE 10 MG/ML IJ SOLN
40.0000 mg | Freq: Once | INTRAMUSCULAR | Status: AC
  Administered 2019-10-22: 40 mg via INTRAVENOUS
  Filled 2019-10-22: qty 4

## 2019-10-22 MED ORDER — METOLAZONE 5 MG PO TABS
5.0000 mg | ORAL_TABLET | Freq: Once | ORAL | Status: AC
  Administered 2019-10-22: 12:00:00 5 mg via ORAL
  Filled 2019-10-22: qty 1

## 2019-10-22 MED ORDER — SODIUM CHLORIDE 0.9% IV SOLUTION: Freq: Once | INTRAVENOUS | Status: AC

## 2019-10-22 NOTE — Progress Notes (Signed)
This RN witness verbal/written consent for blood transfusion. Signed with "x" as patient is visually impaired.

## 2019-10-22 NOTE — Progress Notes (Signed)
Inpatient Diabetes Program Recommendations  AACE/ADA: New Consensus Statement on Inpatient Glycemic Control (2015)  Target Ranges:  Prepandial:   less than 140 mg/dL      Peak postprandial:   less than 180 mg/dL (1-2 hours)      Critically ill patients:  140 - 180 mg/dL   Lab Results  Component Value Date   GLUCAP 162 (H) 10/22/2019   HGBA1C 6.4 (H) 09/19/2019    Review of Glycemic Control Results for Christopher Davila, Christopher Davila (MRN [id number]) as of 10/22/2019 09:11  Ref. Range 10/21/2019 06:06 10/21/2019 10:57 10/21/2019 16:02 10/21/2019 20:54 10/21/2019 22:08 10/22/2019 06:07  Glucose-Capillary Latest Ref Range: 70 - 99 mg/dL 132 (H) 164 (H) 149 (H) 309 (H) 218 (H) 162 (H)   Diabetes history: DM 2 Outpatient Diabetes medications: Glipizide 10 mg bid Current orders for Inpatient glycemic control:  Novolog 0-9 units tid  Inpatient Diabetes Program Recommendations:    Add Novolog HS scale.  Thanks,  Tama Headings RN, MSN, BC-ADM Inpatient Diabetes Coordinator Team Pager [phone] (8a-5p)

## 2019-10-22 NOTE — Progress Notes (Signed)
PROGRESS NOTE    Christopher Davila  UKG:[id number] DOB: Jul 16, 1970 DOA: 10/13/2019 PCP: Marliss Coots, NP    Brief Narrative:  49 y.o.malewith medical history significant ofHTN; DM with gastroparesis and blindness; stage 4 CKD; and chronic diastolic CHF presenting with diarrhea and weakness. He was last admitted from 4/10-12 for acute on chronic diastolic CHF. He returned to the ER on 4/20 with failure to thrive and weakness, but did not qualify for hospitalization at that time and is not able to be placed in SNF. He reports that he has been having diarrhea 1 day PTA. He wonders if he ate something that upset his stomach. No nausea or vomiting. No recent antibiotics other than eye drops. He has been having some problems with weakness in his legs and arms for a few days and it also happened last week. PT came to see him at home last week. In the ED, severe acidosis with gap. Giving Mag, bicarb, LR.  Patient admitted for further management  Assessment & Plan:   Principal Problem:   Diarrhea Active Problems:   Type 2 diabetes mellitus with hypoglycemia (HCC)   Essential hypertension   Hyperlipidemia   CKD (chronic kidney disease) stage 4, GFR 15-29 ml/min (HCC)   Weakness   Metabolic acidosis, increased anion gap   Acute urinary retention   Diarrhea Resolved C. difficile negative, stool panel negative Seems stable at this time  Acute on chronic diastolic HF Significant bilateral lower extremity edema/scrotal swelling Noted to have progressive peripheral edema BNP 633 Echo on 01/2019 showed EF of 55 to 60%, left ventricular diastolic Doppler parameters are consistent with pseudonormalization Due to CKD, nephrology was earlier consulted to assist in fluid management, appreciate recs Still with marked generalized pitting edema Will continue on lasix 80mg  IV BID Recheck bmet in AM  CKD stage IV/anion gap metabolic acidosis Nephrology following Continuing IV lasix per  above Cr is slowly trending up Continue bicarb Repeat bmet in AM  Anemia of CKD Prior anemia panel iron 44, sats 14 Status post 1 dose of Feraheme on 10/16/2019 Hgb trended down to 6.8. Have ordered 1 unit PRBC with additional lasix following transfusion Repeat CBC in AM  Hypertension BP currently stable Cont on current hydralazine, Coreg as pt tolerates  Diabetes mellitus type 2 Episode of hypoglycemia on 10/13/2019 Continue SSI, Accu-Cheks, hypoglycemic protocol Anticipate stopping Glucotrol at time of discharge  Generalized weakness/failure to thrive Patient lives with a cousin, who works and unable to care for him at home PT/OT, recommend SNF (not a candidate unless he can pay privately) TOC consulted-plan to DC home with home health services when ultimately clear for d/c  Urinary retention Continue indwelling Foley catheter Recommend outpatient follow-up with Urology when pt is discharged  Blindness Continue eyedrops as tolerated Awaiting to have surgery when discharged later  DVT prophylaxis: heparin subq Code Status: Full Family Communication: Pt in room, family not at bedside  Status is: Inpatient  Remains inpatient appropriate because:Hemodynamically unstable and IV treatments appropriate due to intensity of illness or inability to take PO, still needs IV lasix  Dispo: The patient is from: Home              Anticipated d/c is to: Home              Anticipated d/c date is: > 3 days              Patient currently is not medically stable to d/c., when cleared by Nephrology  Consultants:   Nephrology  Procedures:   Antimicrobials: Anti-infectives (From admission, onward)   None      Subjective: Requesting needing help with lunch  Objective: Vitals:   10/22/19 1330 10/22/19 1345 10/22/19 1400 10/22/19 1405  BP:    126/85  Pulse:    72  Resp: 19 (!) 26 (!) 21 17  Temp:    97.8 F (36.6 C)  TempSrc:    Oral  SpO2:    100%  Weight:       Height:        Intake/Output Summary (Last 24 hours) at 10/22/2019 1424 Last data filed at 10/22/2019 1330 Gross per 24 hour  Intake 1102 ml  Output 1000 ml  Net 102 ml   Filed Weights   10/20/19 0130 10/21/19 0154 10/22/19 0300  Weight: 76.2 kg 76.7 kg 75.3 kg    Examination: General exam: Conversant, in no acute distress Respiratory system: normal chest rise, clear, no audible wheezing Cardiovascular system: regular rhythm, s1-s2 Gastrointestinal system: Nondistended, nontender, pos BS Central nervous system: No seizures, no tremors Extremities: No cyanosis, no joint deformities Skin: No rashes, no pallor Psychiatry: Affect normal // no auditory hallucinations   Data Reviewed: I have personally reviewed following labs and imaging studies  CBC: Recent Labs  Lab 10/18/19 0523 10/19/19 0427 10/20/19 0529 10/21/19 0417 10/22/19 0528  WBC 7.9 6.2 6.9 7.3 6.6  NEUTROABS 6.0 4.2 4.5 4.7 4.5  HGB 7.9* 7.2* 7.2* 7.3* 6.8*  HCT 25.4* 23.5* 23.8* 24.4* 23.2*  MCV 89.1 90.4 92.6 92.8 92.1  PLT 256 225 212 193 374   Basic Metabolic Panel: Recent Labs  Lab 10/18/19 0523 10/19/19 0427 10/20/19 0529 10/21/19 0417 10/22/19 0528  NA 140 136 138 136 134*  K 4.1 3.8 4.1 4.1 4.0  CL 107 104 105 102 99  CO2 23 24 22 24 23   GLUCOSE 113* 141* 121* 159* 174*  BUN 58* 61* 62* 66* 72*  CREATININE 3.69* 3.55* 3.75* 3.84* 4.04*  CALCIUM 9.1 8.7* 8.8* 8.7* 8.8*  PHOS 6.5*  --  6.6* 6.5* 6.9*   GFR: Estimated Creatinine Clearance: 20.6 mL/min (A) (by C-G formula based on SCr of 4.04 mg/dL (H)). Liver Function Tests: Recent Labs  Lab 10/20/19 0529 10/21/19 0417 10/22/19 0528  ALBUMIN 2.3* 2.3* 2.3*   No results for input(s): LIPASE, AMYLASE in the last 168 hours. No results for input(s): AMMONIA in the last 168 hours. Coagulation Profile: No results for input(s): INR, PROTIME in the last 168 hours. Cardiac Enzymes: No results for input(s): CKTOTAL, CKMB, CKMBINDEX,  TROPONINI in the last 168 hours. BNP (last 3 results) No results for input(s): PROBNP in the last 8760 hours. HbA1C: No results for input(s): HGBA1C in the last 72 hours. CBG: Recent Labs  Lab 10/21/19 1602 10/21/19 2054 10/21/19 2208 10/22/19 0607 10/22/19 1158  GLUCAP 149* 309* 218* 162* 161*   Lipid Profile: No results for input(s): CHOL, HDL, LDLCALC, TRIG, CHOLHDL, LDLDIRECT in the last 72 hours. Thyroid Function Tests: No results for input(s): TSH, T4TOTAL, FREET4, T3FREE, THYROIDAB in the last 72 hours. Anemia Panel: No results for input(s): VITAMINB12, FOLATE, FERRITIN, TIBC, IRON, RETICCTPCT in the last 72 hours. Sepsis Labs: No results for input(s): PROCALCITON, LATICACIDVEN in the last 168 hours.  Recent Results (from the past 240 hour(s))  Gastrointestinal Panel by PCR , Stool     Status: None   Collection Time: 10/13/19  7:50 AM   Specimen: Stool  Result Value Ref Range Status  Campylobacter species NOT DETECTED NOT DETECTED Final   Plesimonas shigelloides NOT DETECTED NOT DETECTED Final   Salmonella species NOT DETECTED NOT DETECTED Final   Yersinia enterocolitica NOT DETECTED NOT DETECTED Final   Vibrio species NOT DETECTED NOT DETECTED Final   Vibrio cholerae NOT DETECTED NOT DETECTED Final   Enteroaggregative E coli (EAEC) NOT DETECTED NOT DETECTED Final   Enteropathogenic E coli (EPEC) NOT DETECTED NOT DETECTED Final   Enterotoxigenic E coli (ETEC) NOT DETECTED NOT DETECTED Final   Shiga like toxin producing E coli (STEC) NOT DETECTED NOT DETECTED Final   Shigella/Enteroinvasive E coli (EIEC) NOT DETECTED NOT DETECTED Final   Cryptosporidium NOT DETECTED NOT DETECTED Final   Cyclospora cayetanensis NOT DETECTED NOT DETECTED Final   Entamoeba histolytica NOT DETECTED NOT DETECTED Final   Giardia lamblia NOT DETECTED NOT DETECTED Final   Adenovirus F40/41 NOT DETECTED NOT DETECTED Final   Astrovirus NOT DETECTED NOT DETECTED Final   Norovirus GI/GII NOT  DETECTED NOT DETECTED Final   Rotavirus A NOT DETECTED NOT DETECTED Final   Sapovirus (I, II, IV, and V) NOT DETECTED NOT DETECTED Final    Comment: Performed at [facility], [address] [place], [place] [address]  C Difficile Quick Screen w PCR reflex     Status: None   Collection Time: 10/13/19  7:50 AM   Specimen: Stool  Result Value Ref Range Status   C Diff antigen NEGATIVE NEGATIVE Final   C Diff toxin NEGATIVE NEGATIVE Final   C Diff interpretation No C. difficile detected.  Final    Comment: Performed at [facility], [place] [address] [place], [place] [address]  Respiratory Panel by RT PCR (Flu A&B, Covid) - Nasopharyngeal Swab     Status: None   Collection Time: 10/13/19 10:19 AM   Specimen: Nasopharyngeal Swab  Result Value Ref Range Status   SARS Coronavirus 2 by RT PCR NEGATIVE NEGATIVE Final    Comment: (NOTE) SARS-CoV-2 target nucleic acids are NOT DETECTED. The SARS-CoV-2 RNA is generally detectable in upper respiratoy specimens during the acute phase of infection. The lowest concentration of SARS-CoV-2 viral copies this assay can detect is 131 copies/mL. A negative result does not preclude SARS-Cov-2 infection and should not be used as the sole basis for treatment or other patient management decisions. A negative result may occur with  improper specimen collection/handling, submission of specimen other than nasopharyngeal swab, presence of viral mutation(s) within the areas targeted by this assay, and inadequate number of viral copies (<131 copies/mL). A negative result must be combined with clinical observations, patient history, and epidemiological information. The expected result is Negative. Fact Sheet for Patients:  PinkCheek.be Fact Sheet for Healthcare Providers:  GravelBags.it This test is not yet ap proved or cleared by the [place] FDA and  has been authorized for detection  and/or diagnosis of SARS-CoV-2 by FDA under an Emergency Use Authorization (EUA). This EUA will remain  in effect (meaning this test can be used) for the duration of the COVID-19 declaration under Section 564(b)(1) of the Act, 21 U.S.C. section 360bbb-3(b)(1), unless the authorization is terminated or revoked sooner.    Influenza A by PCR NEGATIVE NEGATIVE Final   Influenza B by PCR NEGATIVE NEGATIVE Final    Comment: (NOTE) The Xpert Xpress SARS-CoV-2/FLU/RSV assay is intended as an aid in  the diagnosis of influenza from Nasopharyngeal swab specimens and  should not be used as a sole basis for treatment. Nasal washings and  aspirates  are unacceptable for Xpert Xpress SARS-CoV-2/FLU/RSV  testing. Fact Sheet for Patients: PinkCheek.be Fact Sheet for Healthcare Providers: GravelBags.it This test is not yet approved or cleared by the [place] FDA and  has been authorized for detection and/or diagnosis of SARS-CoV-2 by  FDA under an Emergency Use Authorization (EUA). This EUA will remain  in effect (meaning this test can be used) for the duration of the  Covid-19 declaration under Section 564(b)(1) of the Act, 21  U.S.C. section 360bbb-3(b)(1), unless the authorization is  terminated or revoked. Performed at [facility], [place] [address], [place], [place] [address]      Radiology Studies: No results found.  Scheduled Meds: . aspirin EC  81 mg Oral Daily  . bisacodyl  10 mg Rectal Once  . brimonidine  1 drop Left Eye BID  . carvedilol  25 mg Oral BID WC  . Chlorhexidine Gluconate Cloth  6 each Topical Daily  . dorzolamide-timolol  1 drop Left Eye BID  . furosemide  80 mg Intravenous BID  . gabapentin  100 mg Oral TID  . gatifloxacin  1 drop Left Eye QID  . heparin  5,000 Units Subcutaneous Q8H  . hydrALAZINE  25 mg Oral TID  . insulin aspart  0-9 Units Subcutaneous TID WC  . latanoprost  1 drop Left Eye QHS   . senna-docusate  1 tablet Oral BID  . sodium bicarbonate  650 mg Oral Daily   Continuous Infusions: . sodium chloride 250 mL (10/16/19 0909)     LOS: 7 days   Marylu Lund, MD [facility] Pager On Amion  If 7PM-7AM, please contact night-coverage 10/22/2019, 2:24 PM

## 2019-10-22 NOTE — Progress Notes (Signed)
Patient ID: Christopher Davila, male   DOB: 1970/12/21, 49 y.o.   MRN: [id number] S: No complaints O:BP 126/79   Pulse 69   Temp (!) 97.5 F (36.4 C) (Oral)   Resp 16   Ht 5\' 3"  (1.6 m)   Wt 75.3 kg   SpO2 100%   BMI 29.41 kg/m   Intake/Output Summary (Last 24 hours) at 10/22/2019 1131 Last data filed at 10/22/2019 0949 Gross per 24 hour  Intake 1142 ml  Output 1500 ml  Net -358 ml   Intake/Output: I/O last 3 completed shifts: In: 1262 [P.O.:1262] Out: 2076 [Urine:2075; Stool:1]  Intake/Output this shift:  Total I/O In: 120 [P.O.:120] Out: 125 [Urine:125] Weight change: -1.361 kg Gen: NAD CVS: rrr, no rub Resp: cta Abd: +BS, soft, NT/ND Ext: 2+ pretibial edema  Recent Labs  Lab 10/16/19 0316 10/17/19 0524 10/18/19 0523 10/19/19 0427 10/20/19 0529 10/21/19 0417 10/22/19 0528  NA 139 139 140 136 138 136 134*  K 4.8 4.2 4.1 3.8 4.1 4.1 4.0  CL 108 107 107 104 105 102 99  CO2 20* 22 23 24 22 24 23   GLUCOSE 95 163* 113* 141* 121* 159* 174*  BUN 66* 62* 58* 61* 62* 66* 72*  CREATININE 4.01* 3.89* 3.69* 3.55* 3.75* 3.84* 4.04*  ALBUMIN  --   --   --   --  2.3* 2.3* 2.3*  CALCIUM 8.3* 8.9 9.1 8.7* 8.8* 8.7* 8.8*  PHOS  --   --  6.5*  --  6.6* 6.5* 6.9*   Liver Function Tests: Recent Labs  Lab 10/20/19 0529 10/21/19 0417 10/22/19 0528  ALBUMIN 2.3* 2.3* 2.3*   No results for input(s): LIPASE, AMYLASE in the last 168 hours. No results for input(s): AMMONIA in the last 168 hours. CBC: Recent Labs  Lab 10/18/19 0523 10/18/19 0523 10/19/19 0427 10/19/19 0427 10/20/19 0529 10/21/19 0417 10/22/19 0528  WBC 7.9   < > 6.2   < > 6.9 7.3 6.6  NEUTROABS 6.0   < > 4.2   < > 4.5 4.7 4.5  HGB 7.9*   < > 7.2*   < > 7.2* 7.3* 6.8*  HCT 25.4*   < > 23.5*   < > 23.8* 24.4* 23.2*  MCV 89.1  --  90.4  --  92.6 92.8 92.1  PLT 256   < > 225   < > 212 193 187   < > = values in this interval not displayed.   Cardiac Enzymes: No results for input(s): CKTOTAL, CKMB, CKMBINDEX,  TROPONINI in the last 168 hours. CBG: Recent Labs  Lab 10/21/19 1057 10/21/19 1602 10/21/19 2054 10/21/19 2208 10/22/19 0607  GLUCAP 164* 149* 309* 218* 162*    Iron Studies: No results for input(s): IRON, TIBC, TRANSFERRIN, FERRITIN in the last 72 hours. Studies/Results: No results found. Marland Kitchen aspirin EC  81 mg Oral Daily  . bisacodyl  10 mg Rectal Once  . brimonidine  1 drop Left Eye BID  . carvedilol  25 mg Oral BID WC  . Chlorhexidine Gluconate Cloth  6 each Topical Daily  . dorzolamide-timolol  1 drop Left Eye BID  . furosemide  40 mg Intravenous Once  . furosemide  80 mg Intravenous BID  . gabapentin  100 mg Oral TID  . gatifloxacin  1 drop Left Eye QID  . heparin  5,000 Units Subcutaneous Q8H  . hydrALAZINE  25 mg Oral TID  . insulin aspart  0-9 Units Subcutaneous TID WC  . latanoprost  1 drop Left Eye QHS  . senna-docusate  1 tablet Oral BID  . sodium bicarbonate  650 mg Oral Daily    BMET    Component Value Date/Time   NA 134 (L) 10/22/2019 0528   K 4.0 10/22/2019 0528   CL 99 10/22/2019 0528   CO2 23 10/22/2019 0528   GLUCOSE 174 (H) 10/22/2019 0528   BUN 72 (H) 10/22/2019 0528   CREATININE 4.04 (H) 10/22/2019 0528   CALCIUM 8.8 (L) 10/22/2019 0528   GFRNONAA 16 (L) 10/22/2019 0528   GFRAA 19 (L) 10/22/2019 0528   CBC    Component Value Date/Time   WBC 6.6 10/22/2019 0528   RBC 2.52 (L) 10/22/2019 0528   HGB 6.8 (LL) 10/22/2019 0528   HCT 23.2 (L) 10/22/2019 0528   PLT 187 10/22/2019 0528   MCV 92.1 10/22/2019 0528   MCH 27.0 10/22/2019 0528   MCHC 29.3 (L) 10/22/2019 0528   RDW 15.2 10/22/2019 0528   LYMPHSABS 0.8 10/22/2019 0528   MONOABS 1.1 (H) 10/22/2019 0528   EOSABS 0.2 10/22/2019 0528   BASOSABS 0.0 10/22/2019 0528    Assessment/Plan:  1. AKI/CKD stage IV- in setting of acute on chronic diastolic CHF and diuresis. His baselineis3-3.5.  1. BUN/Cr up slightly with diuresis but still with significant volume overload. 2. Hopefully  blood transfusion today will improve renal function and diuresis 3. Will need to f/u with Dr. Carolin Sicks after discharge. 2. Acute on chronic diastolic CHF- improving with IV lasix 80 mg and one time metolazone 5 mg.Did not respond topo lasix 80 mg dailyso currently on IV lasix 80 mg bid.   1. Continue with this dose for now and will give another dose of metolazone 5 mg before his second IV lasix later today. 3. Urinary retention- s/p foley catheter and will likely need to be discharged with this and follow up with Urology as an outpatient.  4. Anemia of CKD stage IV- hgb dropped to 6.8 and receiving a unit of PRBC's at this time.  Received IV feraheme on 5/1 and Aranesp 40 mcg on 4/30. Will give second dose of feraheme on 10/23/19. 5. DM type 2 - per primary svc  Donetta Potts, MD [facility] [phone]

## 2019-10-22 NOTE — Plan of Care (Signed)
  Problem: Nutrition: ?Goal: Adequate nutrition will be maintained ?Outcome: Completed/Met ?  ?Problem: Skin Integrity: ?Goal: Risk for impaired skin integrity will decrease ?Outcome: Completed/Met ?  ?

## 2019-10-22 NOTE — Progress Notes (Signed)
Per Wyline Copas, MD verbal order, order cardiac monitoring continuous. RN implemented orders.

## 2019-10-22 NOTE — Progress Notes (Signed)
Second IV access site provided for scheduled blood transfusion.

## 2019-10-23 LAB — CBC WITH DIFFERENTIAL/PLATELET
Abs Immature Granulocytes: 0.12 10*3/uL — ABNORMAL HIGH (ref 0.00–0.07)
Basophils Absolute: 0 10*3/uL (ref 0.0–0.1)
Basophils Relative: 0 %
Eosinophils Absolute: 0.2 10*3/uL (ref 0.0–0.5)
Eosinophils Relative: 2 %
HCT: 26.2 % — ABNORMAL LOW (ref 39.0–52.0)
Hemoglobin: 8.1 g/dL — ABNORMAL LOW (ref 13.0–17.0)
Immature Granulocytes: 2 %
Lymphocytes Relative: 11 %
Lymphs Abs: 0.8 10*3/uL (ref 0.7–4.0)
MCH: 28.1 pg (ref 26.0–34.0)
MCHC: 30.9 g/dL (ref 30.0–36.0)
MCV: 91 fL (ref 80.0–100.0)
Monocytes Absolute: 0.8 10*3/uL (ref 0.1–1.0)
Monocytes Relative: 11 %
Neutro Abs: 5.4 10*3/uL (ref 1.7–7.7)
Neutrophils Relative %: 74 %
Platelets: 195 10*3/uL (ref 150–400)
RBC: 2.88 MIL/uL — ABNORMAL LOW (ref 4.22–5.81)
RDW: 15.4 % (ref 11.5–15.5)
WBC: 7.4 10*3/uL (ref 4.0–10.5)
nRBC: 0 % (ref 0.0–0.2)

## 2019-10-23 LAB — TYPE AND SCREEN
ABO/RH(D): O POS
Antibody Screen: NEGATIVE
Unit division: 0

## 2019-10-23 LAB — RENAL FUNCTION PANEL
Albumin: 2.4 g/dL — ABNORMAL LOW (ref 3.5–5.0)
Anion gap: 13 (ref 5–15)
BUN: 78 mg/dL — ABNORMAL HIGH (ref 6–20)
CO2: 23 mmol/L (ref 22–32)
Calcium: 8.6 mg/dL — ABNORMAL LOW (ref 8.9–10.3)
Chloride: 96 mmol/L — ABNORMAL LOW (ref 98–111)
Creatinine, Ser: 4.12 mg/dL — ABNORMAL HIGH (ref 0.61–1.24)
GFR calc Af Amer: 19 mL/min — ABNORMAL LOW (ref >60)
GFR calc non Af Amer: 16 mL/min — ABNORMAL LOW (ref >60)
Glucose, Bld: 194 mg/dL — ABNORMAL HIGH (ref 70–99)
Phosphorus: 7.4 mg/dL — ABNORMAL HIGH (ref 2.5–4.6)
Potassium: 3.5 mmol/L (ref 3.5–5.1)
Sodium: 132 mmol/L — ABNORMAL LOW (ref 135–145)

## 2019-10-23 LAB — BPAM RBC
Blood Product Expiration Date: 202106022359
ISSUE DATE / TIME: 202105071048
Unit Type and Rh: 5100

## 2019-10-23 LAB — GLUCOSE, CAPILLARY
Glucose-Capillary: 147 mg/dL — ABNORMAL HIGH (ref 70–99)
Glucose-Capillary: 181 mg/dL — ABNORMAL HIGH (ref 70–99)
Glucose-Capillary: 153 mg/dL — ABNORMAL HIGH (ref 70–99)
Glucose-Capillary: 180 mg/dL — ABNORMAL HIGH (ref 70–99)

## 2019-10-23 MED ORDER — FUROSEMIDE 10 MG/ML IJ SOLN
120.0000 mg | Freq: Two times a day (BID) | INTRAVENOUS | Status: DC
  Administered 2019-10-23 – 2019-10-24 (×2): 120 mg via INTRAVENOUS
  Filled 2019-10-23 (×2): qty 12
  Filled 2019-10-23: qty 10

## 2019-10-23 NOTE — Progress Notes (Signed)
PROGRESS NOTE    Christopher Davila  RCV:[id number] DOB: 26-Aug-1970 DOA: 10/13/2019 PCP: Marliss Coots, NP    Brief Narrative:  49 y.o.malewith medical history significant ofHTN; DM with gastroparesis and blindness; stage 4 CKD; and chronic diastolic CHF presenting with diarrhea and weakness. He was last admitted from 4/10-12 for acute on chronic diastolic CHF. He returned to the ER on 4/20 with failure to thrive and weakness, but did not qualify for hospitalization at that time and is not able to be placed in SNF. He reports that he has been having diarrhea 1 day PTA. He wonders if he ate something that upset his stomach. No nausea or vomiting. No recent antibiotics other than eye drops. He has been having some problems with weakness in his legs and arms for a few days and it also happened last week. PT came to see him at home last week. In the ED, severe acidosis with gap. Giving Mag, bicarb, LR.  Patient admitted for further management  Assessment & Plan:   Principal Problem:   Diarrhea Active Problems:   Type 2 diabetes mellitus with hypoglycemia (HCC)   Essential hypertension   Hyperlipidemia   CKD (chronic kidney disease) stage 4, GFR 15-29 ml/min (HCC)   Weakness   Metabolic acidosis, increased anion gap   Acute urinary retention   Diarrhea Resolved C. difficile negative, stool panel negative Remains stable at this time  Acute on chronic diastolic HF Significant bilateral lower extremity edema/scrotal swelling Noted to have progressive peripheral edema BNP 633 Echo on 01/2019 showed EF of 55 to 60%, left ventricular diastolic Doppler parameters are consistent with pseudonormalization Due to CKD, nephrology was earlier consulted to assist in fluid management, appreciate recs Still with marked generalized pitting edema Per Nephrology, lasix increased to 120mg  IV BID Repeat bmet in AM  CKD stage IV/anion gap metabolic acidosis Nephrology continues to  follow Continuing IV lasix per above Cr is slowly trending up Continue bicarb Recheck bmet in AM  Anemia of CKD Prior anemia panel iron 44, sats 14 Status post 1 dose of Feraheme on 10/16/2019 Hgb recently trended down to 6.8. Pt now s/p 1 unit PRBC on 5/7 with appropriate correction in hgb Repeat CBC in AM  Hypertension BP currently stable Cont on current hydralazine, Coreg as pt tolerates  Diabetes mellitus type 2 Episode of hypoglycemia on 10/13/2019 Continue SSI, Accu-Cheks, hypoglycemic protocol Plan on stopping Glucotrol at time of discharge  Generalized weakness/failure to thrive Patient lives with a cousin, who works and unable to care for him at home PT/OT, recommend SNF (not a candidate unless he can pay privately) TOC consulted-plan to DC home with home health services when pt is clear for d/c  Urinary retention Continue indwelling Foley catheter Recommend outpatient follow-up with Urology when pt is discharged  Blindness Continue eyedrops as tolerated Awaiting to have surgery when pt is discharged  DVT prophylaxis: heparin subq Code Status: Full Family Communication: Pt in room, family not at bedside  Status is: Inpatient  Remains inpatient appropriate because:Hemodynamically unstable and IV treatments appropriate due to intensity of illness or inability to take PO, still needs increasing IV lasix  Dispo: The patient is from: Home              Anticipated d/c is to: Home              Anticipated d/c date is: > 3 days              Patient currently  is not medically stable to d/c., when cleared by Nephrology  Consultants:   Nephrology  Procedures:   Antimicrobials: Anti-infectives (From admission, onward)   None      Subjective: Denies sob. Asking for help arranging lunch tray  Objective: Vitals:   10/22/19 2024 10/23/19 0511 10/23/19 0520 10/23/19 1112  BP: 132/72  121/83 132/87  Pulse: 73  70 71  Resp: 18  20 16   Temp: 98.1 F (36.7  C)  97.8 F (36.6 C) (!) 97.5 F (36.4 C)  TempSrc: Oral  Oral Oral  SpO2: 100%  100% 100%  Weight:  76.2 kg    Height:        Intake/Output Summary (Last 24 hours) at 10/23/2019 1323 Last data filed at 10/23/2019 1251 Gross per 24 hour  Intake 1439.34 ml  Output 1000 ml  Net 439.34 ml   Filed Weights   10/21/19 0154 10/22/19 0300 10/23/19 0511  Weight: 76.7 kg 75.3 kg 76.2 kg    Examination: General exam: Awake, laying in bed, in nad Respiratory system: Normal respiratory effort, no wheezing Cardiovascular system: regular rate, s1, s2 Gastrointestinal system: Soft, nondistended, positive BS Central nervous system: CN2-12 grossly intact, strength intact Extremities: Perfused, no clubbing Skin: Normal skin turgor, no notable skin lesions seen Psychiatry: Mood normal // no visual hallucinations   Data Reviewed: I have personally reviewed following labs and imaging studies  CBC: Recent Labs  Lab 10/19/19 0427 10/19/19 0427 10/20/19 0529 10/21/19 0417 10/22/19 0528 10/22/19 1636 10/23/19 0858  WBC 6.2  --  6.9 7.3 6.6  --  7.4  NEUTROABS 4.2  --  4.5 4.7 4.5  --  5.4  HGB 7.2*   < > 7.2* 7.3* 6.8* 8.3* 8.1*  HCT 23.5*   < > 23.8* 24.4* 23.2* 27.0* 26.2*  MCV 90.4  --  92.6 92.8 92.1  --  91.0  PLT 225  --  212 193 187  --  195   < > = values in this interval not displayed.   Basic Metabolic Panel: Recent Labs  Lab 10/18/19 0523 10/18/19 0523 10/19/19 0427 10/20/19 0529 10/21/19 0417 10/22/19 0528 10/23/19 0858  NA 140   < > 136 138 136 134* 132*  K 4.1   < > 3.8 4.1 4.1 4.0 3.5  CL 107   < > 104 105 102 99 96*  CO2 23   < > 24 22 24 23 23   GLUCOSE 113*   < > 141* 121* 159* 174* 194*  BUN 58*   < > 61* 62* 66* 72* 78*  CREATININE 3.69*   < > 3.55* 3.75* 3.84* 4.04* 4.12*  CALCIUM 9.1   < > 8.7* 8.8* 8.7* 8.8* 8.6*  PHOS 6.5*  --   --  6.6* 6.5* 6.9* 7.4*   < > = values in this interval not displayed.   GFR: Estimated Creatinine Clearance: 20.3 mL/min  (A) (by C-G formula based on SCr of 4.12 mg/dL (H)). Liver Function Tests: Recent Labs  Lab 10/20/19 0529 10/21/19 0417 10/22/19 0528 10/23/19 0858  ALBUMIN 2.3* 2.3* 2.3* 2.4*   No results for input(s): LIPASE, AMYLASE in the last 168 hours. No results for input(s): AMMONIA in the last 168 hours. Coagulation Profile: No results for input(s): INR, PROTIME in the last 168 hours. Cardiac Enzymes: No results for input(s): CKTOTAL, CKMB, CKMBINDEX, TROPONINI in the last 168 hours. BNP (last 3 results) No results for input(s): PROBNP in the last 8760 hours. HbA1C: No results for input(s):  HGBA1C in the last 72 hours. CBG: Recent Labs  Lab 10/22/19 1158 10/22/19 1721 10/22/19 2117 10/23/19 0611 10/23/19 1114  GLUCAP 161* 174* 193* 147* 181*   Lipid Profile: No results for input(s): CHOL, HDL, LDLCALC, TRIG, CHOLHDL, LDLDIRECT in the last 72 hours. Thyroid Function Tests: No results for input(s): TSH, T4TOTAL, FREET4, T3FREE, THYROIDAB in the last 72 hours. Anemia Panel: No results for input(s): VITAMINB12, FOLATE, FERRITIN, TIBC, IRON, RETICCTPCT in the last 72 hours. Sepsis Labs: No results for input(s): PROCALCITON, LATICACIDVEN in the last 168 hours.  No results found for this or any previous visit (from the past 240 hour(s)).   Radiology Studies: No results found.  Scheduled Meds: . aspirin EC  81 mg Oral Daily  . bisacodyl  10 mg Rectal Once  . brimonidine  1 drop Left Eye BID  . carvedilol  25 mg Oral BID WC  . Chlorhexidine Gluconate Cloth  6 each Topical Daily  . dorzolamide-timolol  1 drop Left Eye BID  . gabapentin  100 mg Oral TID  . gatifloxacin  1 drop Left Eye QID  . heparin  5,000 Units Subcutaneous Q8H  . hydrALAZINE  25 mg Oral TID  . insulin aspart  0-9 Units Subcutaneous TID WC  . latanoprost  1 drop Left Eye QHS  . senna-docusate  1 tablet Oral BID  . sodium bicarbonate  650 mg Oral Daily   Continuous Infusions: . sodium chloride 250 mL  (10/16/19 0909)  . furosemide       LOS: 8 days   Marylu Lund, MD [facility] Pager On Amion  If 7PM-7AM, please contact night-coverage 10/23/2019, 1:23 PM

## 2019-10-23 NOTE — Progress Notes (Signed)
Patient ID: Christopher Davila, male   DOB: 02/19/1971, 49 y.o.   MRN: [id number] S: Pt refused lab draws this morning.  No new complaints O:BP 121/83 (BP Location: Left Arm)   Pulse 70   Temp 97.8 F (36.6 C) (Oral)   Resp 20   Ht 5\' 3"  (1.6 m)   Wt 76.2 kg   SpO2 100%   BMI 29.76 kg/m   Intake/Output Summary (Last 24 hours) at 10/23/2019 1104 Last data filed at 10/23/2019 0824 Gross per 24 hour  Intake 1448.97 ml  Output 700 ml  Net 748.97 ml   Intake/Output: I/O last 3 completed shifts: In: 2011 [P.O.:1402; I.V.:294; Blood:315] Out: 1200 [Urine:1200]  Intake/Output this shift:  Total I/O In: 240 [P.O.:240] Out: -  Weight change: 0.907 kg Gen: NAD CVS: RRR, no rub Resp:CTA Abd: +BS, soft, NT/ND Ext: 2+ edema  Recent Labs  Lab 10/17/19 0524 10/18/19 0523 10/19/19 0427 10/20/19 0529 10/21/19 0417 10/22/19 0528 10/23/19 0858  NA 139 140 136 138 136 134* 132*  K 4.2 4.1 3.8 4.1 4.1 4.0 3.5  CL 107 107 104 105 102 99 96*  CO2 22 23 24 22 24 23 23   GLUCOSE 163* 113* 141* 121* 159* 174* 194*  BUN 62* 58* 61* 62* 66* 72* 78*  CREATININE 3.89* 3.69* 3.55* 3.75* 3.84* 4.04* 4.12*  ALBUMIN  --   --   --  2.3* 2.3* 2.3* 2.4*  CALCIUM 8.9 9.1 8.7* 8.8* 8.7* 8.8* 8.6*  PHOS  --  6.5*  --  6.6* 6.5* 6.9* 7.4*   Liver Function Tests: Recent Labs  Lab 10/21/19 0417 10/22/19 0528 10/23/19 0858  ALBUMIN 2.3* 2.3* 2.4*   No results for input(s): LIPASE, AMYLASE in the last 168 hours. No results for input(s): AMMONIA in the last 168 hours. CBC: Recent Labs  Lab 10/19/19 0427 10/19/19 0427 10/20/19 0529 10/20/19 0529 10/21/19 0417 10/21/19 0417 10/22/19 0528 10/22/19 1636 10/23/19 0858  WBC 6.2   < > 6.9   < > 7.3  --  6.6  --  7.4  NEUTROABS 4.2   < > 4.5   < > 4.7  --  4.5  --  5.4  HGB 7.2*   < > 7.2*   < > 7.3*   < > 6.8* 8.3* 8.1*  HCT 23.5*   < > 23.8*   < > 24.4*   < > 23.2* 27.0* 26.2*  MCV 90.4  --  92.6  --  92.8  --  92.1  --  91.0  PLT 225   < > 212   < >  193  --  187  --  195   < > = values in this interval not displayed.   Cardiac Enzymes: No results for input(s): CKTOTAL, CKMB, CKMBINDEX, TROPONINI in the last 168 hours. CBG: Recent Labs  Lab 10/22/19 0607 10/22/19 1158 10/22/19 1721 10/22/19 2117 10/23/19 0611  GLUCAP 162* 161* 174* 193* 147*    Iron Studies: No results for input(s): IRON, TIBC, TRANSFERRIN, FERRITIN in the last 72 hours. Studies/Results: No results found. Marland Kitchen aspirin EC  81 mg Oral Daily  . bisacodyl  10 mg Rectal Once  . brimonidine  1 drop Left Eye BID  . carvedilol  25 mg Oral BID WC  . Chlorhexidine Gluconate Cloth  6 each Topical Daily  . dorzolamide-timolol  1 drop Left Eye BID  . furosemide  80 mg Intravenous BID  . gabapentin  100 mg Oral TID  . gatifloxacin  1 drop Left Eye QID  . heparin  5,000 Units Subcutaneous Q8H  . hydrALAZINE  25 mg Oral TID  . insulin aspart  0-9 Units Subcutaneous TID WC  . latanoprost  1 drop Left Eye QHS  . senna-docusate  1 tablet Oral BID  . sodium bicarbonate  650 mg Oral Daily    BMET    Component Value Date/Time   NA 132 (L) 10/23/2019 0858   K 3.5 10/23/2019 0858   CL 96 (L) 10/23/2019 0858   CO2 23 10/23/2019 0858   GLUCOSE 194 (H) 10/23/2019 0858   BUN 78 (H) 10/23/2019 0858   CREATININE 4.12 (H) 10/23/2019 0858   CALCIUM 8.6 (L) 10/23/2019 0858   GFRNONAA 16 (L) 10/23/2019 0858   GFRAA 19 (L) 10/23/2019 0858   CBC    Component Value Date/Time   WBC 7.4 10/23/2019 0858   RBC 2.88 (L) 10/23/2019 0858   HGB 8.1 (L) 10/23/2019 0858   HCT 26.2 (L) 10/23/2019 0858   PLT 195 10/23/2019 0858   MCV 91.0 10/23/2019 0858   MCH 28.1 10/23/2019 0858   MCHC 30.9 10/23/2019 0858   RDW 15.4 10/23/2019 0858   LYMPHSABS 0.8 10/23/2019 0858   MONOABS 0.8 10/23/2019 0858   EOSABS 0.2 10/23/2019 0858   BASOSABS 0.0 10/23/2019 0858     Assessment/Plan:  1. AKI/CKD stage IV- in setting of acute on chronic diastolic CHF and diuresis. His  baselineis3-3.5.  1. BUN/Cr up slightly with diuresisbut still with significant volume overload. 2. Hopefully blood transfusion today will improve renal function and diuresis 3. Will need to f/u with Dr. Carolin Sicks after discharge. 2. Acute on chronic diastolic CHF- improving with IV lasix 80 mg and one time metolazone 5 mg.Did not respond topo lasix 80 mg dailysocurrently on IV lasix 80 mg bid.  1. Not responding with IV lasix 80 mg and metolazone 5 mg.   2. Will increase IV lasix to 120mg  bid and follow. 3. Urinary retention- s/p foley catheter and will likely need to be discharged with this and follow up with Urology as an outpatient.  4. Anemia of CKD stage IV- hgb dropped to 6.8 and receiving a unit of PRBC's at this time.  Received IV feraheme on 5/1 and Aranesp 40 mcg on 4/30. Will give second dose of feraheme on 10/23/19. 5. DM type 2 - per primary svc  Donetta Potts, MD [facility] [phone]

## 2019-10-24 LAB — CBC WITH DIFFERENTIAL/PLATELET
Abs Immature Granulocytes: 0.11 10*3/uL — ABNORMAL HIGH (ref 0.00–0.07)
Basophils Absolute: 0 10*3/uL (ref 0.0–0.1)
Basophils Relative: 0 %
Eosinophils Absolute: 0.1 10*3/uL (ref 0.0–0.5)
Eosinophils Relative: 2 %
HCT: 26.2 % — ABNORMAL LOW (ref 39.0–52.0)
Hemoglobin: 8.1 g/dL — ABNORMAL LOW (ref 13.0–17.0)
Immature Granulocytes: 2 %
Lymphocytes Relative: 12 %
Lymphs Abs: 0.8 10*3/uL (ref 0.7–4.0)
MCH: 28.1 pg (ref 26.0–34.0)
MCHC: 30.9 g/dL (ref 30.0–36.0)
MCV: 91 fL (ref 80.0–100.0)
Monocytes Absolute: 0.9 10*3/uL (ref 0.1–1.0)
Monocytes Relative: 14 %
Neutro Abs: 4.5 10*3/uL (ref 1.7–7.7)
Neutrophils Relative %: 70 %
Platelets: 190 10*3/uL (ref 150–400)
RBC: 2.88 MIL/uL — ABNORMAL LOW (ref 4.22–5.81)
RDW: 15.7 % — ABNORMAL HIGH (ref 11.5–15.5)
WBC: 6.4 10*3/uL (ref 4.0–10.5)
nRBC: 0 % (ref 0.0–0.2)

## 2019-10-24 LAB — RENAL FUNCTION PANEL
Albumin: 2.4 g/dL — ABNORMAL LOW (ref 3.5–5.0)
Anion gap: 13 (ref 5–15)
BUN: 84 mg/dL — ABNORMAL HIGH (ref 6–20)
CO2: 22 mmol/L (ref 22–32)
Calcium: 8.6 mg/dL — ABNORMAL LOW (ref 8.9–10.3)
Chloride: 98 mmol/L (ref 98–111)
Creatinine, Ser: 4.73 mg/dL — ABNORMAL HIGH (ref 0.61–1.24)
GFR calc Af Amer: 16 mL/min — ABNORMAL LOW (ref >60)
GFR calc non Af Amer: 14 mL/min — ABNORMAL LOW (ref >60)
Glucose, Bld: 155 mg/dL — ABNORMAL HIGH (ref 70–99)
Phosphorus: 8 mg/dL — ABNORMAL HIGH (ref 2.5–4.6)
Potassium: 3.7 mmol/L (ref 3.5–5.1)
Sodium: 133 mmol/L — ABNORMAL LOW (ref 135–145)

## 2019-10-24 LAB — GLUCOSE, CAPILLARY
Glucose-Capillary: 146 mg/dL — ABNORMAL HIGH (ref 70–99)
Glucose-Capillary: 144 mg/dL — ABNORMAL HIGH (ref 70–99)
Glucose-Capillary: 168 mg/dL — ABNORMAL HIGH (ref 70–99)
Glucose-Capillary: 181 mg/dL — ABNORMAL HIGH (ref 70–99)

## 2019-10-24 MED ORDER — SODIUM CHLORIDE 0.9 % IV SOLN
510.0000 mg | Freq: Once | INTRAVENOUS | Status: AC
  Administered 2019-10-24: 510 mg via INTRAVENOUS
  Filled 2019-10-24: qty 17

## 2019-10-24 MED ORDER — FUROSEMIDE 10 MG/ML IJ SOLN
160.0000 mg | Freq: Three times a day (TID) | INTRAVENOUS | Status: DC
  Administered 2019-10-24 – 2019-10-27 (×9): 160 mg via INTRAVENOUS
  Filled 2019-10-24: qty 16
  Filled 2019-10-24: qty 2
  Filled 2019-10-24: qty 10
  Filled 2019-10-24 (×7): qty 16
  Filled 2019-10-24: qty 10

## 2019-10-24 NOTE — Progress Notes (Signed)
Patient ID: Christopher Davila, male   DOB: [birth year], 49 y.o.   MRN: [id number] S: No new complaints this morning O:BP (!) 145/81 (BP Location: Left Arm)   Pulse 70   Temp 98 F (36.7 C) (Oral)   Resp 16   Ht 5\' 3"  (1.6 m)   Wt 78.9 kg   SpO2 93%   BMI 30.82 kg/m   Intake/Output Summary (Last 24 hours) at 10/24/2019 1058 Last data filed at 10/24/2019 0900 Gross per 24 hour  Intake 960 ml  Output 1100 ml  Net -140 ml   Intake/Output: I/O last 3 completed shifts: In: 1200 [P.O.:1200] Out: 1375 [Urine:1375]  Intake/Output this shift:  Total I/O In: 240 [P.O.:240] Out: 100 [Urine:100] Weight change: 2.722 kg Gen: NAD CVS: no rub Resp: occ rhonchi Abd: +BS, soft, NT/ND Ext: 2+ edema  Recent Labs  Lab 10/18/19 0523 10/19/19 0427 10/20/19 0529 10/21/19 0417 10/22/19 0528 10/23/19 0858 10/24/19 0419  NA 140 136 138 136 134* 132* 133*  K 4.1 3.8 4.1 4.1 4.0 3.5 3.7  CL 107 104 105 102 99 96* 98  CO2 23 24 22 24 23 23 22   GLUCOSE 113* 141* 121* 159* 174* 194* 155*  BUN 58* 61* 62* 66* 72* 78* 84*  CREATININE 3.69* 3.55* 3.75* 3.84* 4.04* 4.12* 4.73*  ALBUMIN  --   --  2.3* 2.3* 2.3* 2.4* 2.4*  CALCIUM 9.1 8.7* 8.8* 8.7* 8.8* 8.6* 8.6*  PHOS 6.5*  --  6.6* 6.5* 6.9* 7.4* 8.0*   Liver Function Tests: Recent Labs  Lab 10/22/19 0528 10/23/19 0858 10/24/19 0419  ALBUMIN 2.3* 2.4* 2.4*   No results for input(s): LIPASE, AMYLASE in the last 168 hours. No results for input(s): AMMONIA in the last 168 hours. CBC: Recent Labs  Lab 10/20/19 0529 10/20/19 0529 10/21/19 0417 10/21/19 0417 10/22/19 0528 10/22/19 0528 10/22/19 1636 10/23/19 0858 10/24/19 0419  WBC 6.9   < > 7.3   < > 6.6  --   --  7.4 6.4  NEUTROABS 4.5   < > 4.7   < > 4.5  --   --  5.4 4.5  HGB 7.2*   < > 7.3*   < > 6.8*   < > 8.3* 8.1* 8.1*  HCT 23.8*   < > 24.4*   < > 23.2*   < > 27.0* 26.2* 26.2*  MCV 92.6  --  92.8  --  92.1  --   --  91.0 91.0  PLT 212   < > 193   < > 187  --   --  195 190   < >  = values in this interval not displayed.   Cardiac Enzymes: No results for input(s): CKTOTAL, CKMB, CKMBINDEX, TROPONINI in the last 168 hours. CBG: Recent Labs  Lab 10/23/19 0611 10/23/19 1114 10/23/19 1634 10/23/19 2141 10/24/19 0614  GLUCAP 147* 181* 153* 180* 146*    Iron Studies: No results for input(s): IRON, TIBC, TRANSFERRIN, FERRITIN in the last 72 hours. Studies/Results: No results found. Marland Kitchen aspirin EC  81 mg Oral Daily  . bisacodyl  10 mg Rectal Once  . brimonidine  1 drop Left Eye BID  . carvedilol  25 mg Oral BID WC  . Chlorhexidine Gluconate Cloth  6 each Topical Daily  . dorzolamide-timolol  1 drop Left Eye BID  . gabapentin  100 mg Oral TID  . gatifloxacin  1 drop Left Eye QID  . heparin  5,000 Units Subcutaneous Q8H  . hydrALAZINE  25 mg Oral TID  . insulin aspart  0-9 Units Subcutaneous TID WC  . latanoprost  1 drop Left Eye QHS  . senna-docusate  1 tablet Oral BID  . sodium bicarbonate  650 mg Oral Daily    BMET    Component Value Date/Time   NA 133 (L) 10/24/2019 0419   K 3.7 10/24/2019 0419   CL 98 10/24/2019 0419   CO2 22 10/24/2019 0419   GLUCOSE 155 (H) 10/24/2019 0419   BUN 84 (H) 10/24/2019 0419   CREATININE 4.73 (H) 10/24/2019 0419   CALCIUM 8.6 (L) 10/24/2019 0419   GFRNONAA 14 (L) 10/24/2019 0419   GFRAA 16 (L) 10/24/2019 0419   CBC    Component Value Date/Time   WBC 6.4 10/24/2019 0419   RBC 2.88 (L) 10/24/2019 0419   HGB 8.1 (L) 10/24/2019 0419   HCT 26.2 (L) 10/24/2019 0419   PLT 190 10/24/2019 0419   MCV 91.0 10/24/2019 0419   MCH 28.1 10/24/2019 0419   MCHC 30.9 10/24/2019 0419   RDW 15.7 (H) 10/24/2019 0419   LYMPHSABS 0.8 10/24/2019 0419   MONOABS 0.9 10/24/2019 0419   EOSABS 0.1 10/24/2019 0419   BASOSABS 0.0 10/24/2019 0419    Assessment/Plan:  1. AKI/CKD stage IV- in setting of acute on chronic diastolic CHF and diuresis. His baselineis3-3.5 and is followed by Dr. Carolin Sicks in our office.  1. BUN/Cr  continue to climb with IV diuresis, however remains markedly volume overloaded 2. Hopefully we can avoid initiating HD  3. Will need to f/u with Dr. Carolin Sicks after discharge. 2. Acute on chronic diastolic CHF- improving with IV lasix 80 mg andone timemetolazone 5 mg.Did not respond topo lasix 80 mg dailysocurrently on IV lasix 80 mg bid.  1. Not responding with IV lasix 120 mg bid  2. Will increase IV lasix to 160mg  tid and follow. 3. May need to add metolazone if he doesn't respond. 3. Urinary retention- s/p foley catheter and will likely need to be discharged with this and follow up with Urology as an outpatient.  4. Anemia of CKD stage IV- hgbdropped to 6.8 and receiving a unit of PRBC's at this time.Received IV feraheme on 5/1 and Aranesp 40 mcg on 4/30. Will give second dose of feraheme . 5. DM type 2 - per primary svc  Donetta Potts, MD [facility] [phone]

## 2019-10-24 NOTE — Progress Notes (Signed)
PROGRESS NOTE    Christopher Davila  IOE:[id number] DOB: 01-17-1971 DOA: 10/13/2019 PCP: Marliss Coots, NP    Brief Narrative:  49 y.o.malewith medical history significant ofHTN; DM with gastroparesis and blindness; stage 4 CKD; and chronic diastolic CHF presenting with diarrhea and weakness. He was last admitted from 4/10-12 for acute on chronic diastolic CHF. He returned to the ER on 4/20 with failure to thrive and weakness, but did not qualify for hospitalization at that time and is not able to be placed in SNF. He reports that he has been having diarrhea 1 day PTA. He wonders if he ate something that upset his stomach. No nausea or vomiting. No recent antibiotics other than eye drops. He has been having some problems with weakness in his legs and arms for a few days and it also happened last week. PT came to see him at home last week. In the ED, severe acidosis with gap. Giving Mag, bicarb, LR.  Patient admitted for further management  Assessment & Plan:   Principal Problem:   Diarrhea Active Problems:   Type 2 diabetes mellitus with hypoglycemia (HCC)   Essential hypertension   Hyperlipidemia   CKD (chronic kidney disease) stage 4, GFR 15-29 ml/min (HCC)   Weakness   Metabolic acidosis, increased anion gap   Acute urinary retention   Diarrhea Resolved C. difficile negative, stool panel negative Remains stable currently  Acute on chronic diastolic HF Significant bilateral lower extremity edema/scrotal swelling Noted to have progressive peripheral edema BNP 633 Echo on 01/2019 showed EF of 55 to 60%, left ventricular diastolic Doppler parameters are consistent with pseudonormalization Due to CKD, nephrology was earlier consulted to assist in fluid management, appreciate recs Still with marked generalized pitting edema Per Nephrology, increase lasix to 160mg  IV BID with consideration for addition of zaroxolyn if no response Repeat bmet in AM  CKD stage IV/anion gap  metabolic acidosis Nephrology continues to follow Continuing IV lasix per above Cr is slowly trending upwards Continue bicarb Rpeat bmet in AM  Anemia of CKD Prior anemia panel iron 44, sats 14 Status post 1 dose of Feraheme on 10/16/2019 Hgb recently trended down to 6.8. Pt now s/p 1 unit PRBC on 5/7 with appropriate correction in hgb Recheck cbc in AM  Hypertension BP currently stable Cont on current hydralazine, Coreg as pt tolerates  Diabetes mellitus type 2 Episode of hypoglycemia on 10/13/2019 Continue SSI, Accu-Cheks, hypoglycemic protocol Plan on stopping Glucotrol at time of discharge  Generalized weakness/failure to thrive Patient lives with a cousin, who works and unable to care for him at home PT/OT, recommend SNF (not a candidate unless he can pay privately) TOC consulted-plan to DC home with home health services when pt is ultimately clear for d/c  Urinary retention Continue indwelling Foley catheter Recommend outpatient follow-up with Urology when pt is discharged  Blindness Continue eyedrops as tolerated Awaiting to have surgery when pt is discharged  DVT prophylaxis: heparin subq Code Status: Full Family Communication: Pt in room, family not at bedside  Status is: Inpatient  Remains inpatient appropriate because:Hemodynamically unstable and IV treatments appropriate due to intensity of illness or inability to take PO, still needs increasing IV lasix  Dispo: The patient is from: Home              Anticipated d/c is to: Home              Anticipated d/c date is: > 3 days  Patient currently is not medically stable to d/c., when cleared by Nephrology  Consultants:   Nephrology  Procedures:   Antimicrobials: Anti-infectives (From admission, onward)   None      Subjective: Denies sob or chest pains  Objective: Vitals:   10/23/19 2043 10/24/19 0502 10/24/19 0947 10/24/19 1136  BP: 128/70 110/64 (!) 145/81 117/76  Pulse: 70 65  70 68  Resp: 20 16  18   Temp: 97.9 F (36.6 C) 98 F (36.7 C)  (!) 97.4 F (36.3 C)  TempSrc: Oral Oral  Oral  SpO2: 100% 93%  98%  Weight:  78.9 kg    Height:        Intake/Output Summary (Last 24 hours) at 10/24/2019 1425 Last data filed at 10/24/2019 1300 Gross per 24 hour  Intake 1062 ml  Output 800 ml  Net 262 ml   Filed Weights   10/22/19 0300 10/23/19 0511 10/24/19 0502  Weight: 75.3 kg 76.2 kg 78.9 kg    Examination: General exam: Conversant, in no acute distress Respiratory system: normal chest rise, clear, no audible wheezing Cardiovascular system: regular rhythm, s1-s2 Gastrointestinal system: Nondistended, nontender, pos BS Central nervous system: No seizures, no tremors Extremities: No cyanosis, no joint deformities, BLE pitting edema Skin: No rashes, no pallor Psychiatry: Affect normal // no auditory hallucinations   Data Reviewed: I have personally reviewed following labs and imaging studies  CBC: Recent Labs  Lab 10/20/19 0529 10/20/19 0529 10/21/19 0417 10/22/19 0528 10/22/19 1636 10/23/19 0858 10/24/19 0419  WBC 6.9  --  7.3 6.6  --  7.4 6.4  NEUTROABS 4.5  --  4.7 4.5  --  5.4 4.5  HGB 7.2*   < > 7.3* 6.8* 8.3* 8.1* 8.1*  HCT 23.8*   < > 24.4* 23.2* 27.0* 26.2* 26.2*  MCV 92.6  --  92.8 92.1  --  91.0 91.0  PLT 212  --  193 187  --  195 190   < > = values in this interval not displayed.   Basic Metabolic Panel: Recent Labs  Lab 10/20/19 0529 10/21/19 0417 10/22/19 0528 10/23/19 0858 10/24/19 0419  NA 138 136 134* 132* 133*  K 4.1 4.1 4.0 3.5 3.7  CL 105 102 99 96* 98  CO2 22 24 23 23 22   GLUCOSE 121* 159* 174* 194* 155*  BUN 62* 66* 72* 78* 84*  CREATININE 3.75* 3.84* 4.04* 4.12* 4.73*  CALCIUM 8.8* 8.7* 8.8* 8.6* 8.6*  PHOS 6.6* 6.5* 6.9* 7.4* 8.0*   GFR: Estimated Creatinine Clearance: 17.9 mL/min (A) (by C-G formula based on SCr of 4.73 mg/dL (H)). Liver Function Tests: Recent Labs  Lab 10/20/19 0529 10/21/19 0417  10/22/19 0528 10/23/19 0858 10/24/19 0419  ALBUMIN 2.3* 2.3* 2.3* 2.4* 2.4*   No results for input(s): LIPASE, AMYLASE in the last 168 hours. No results for input(s): AMMONIA in the last 168 hours. Coagulation Profile: No results for input(s): INR, PROTIME in the last 168 hours. Cardiac Enzymes: No results for input(s): CKTOTAL, CKMB, CKMBINDEX, TROPONINI in the last 168 hours. BNP (last 3 results) No results for input(s): PROBNP in the last 8760 hours. HbA1C: No results for input(s): HGBA1C in the last 72 hours. CBG: Recent Labs  Lab 10/23/19 1114 10/23/19 1634 10/23/19 2141 10/24/19 0614 10/24/19 1112  GLUCAP 181* 153* 180* 146* 144*   Lipid Profile: No results for input(s): CHOL, HDL, LDLCALC, TRIG, CHOLHDL, LDLDIRECT in the last 72 hours. Thyroid Function Tests: No results for input(s): TSH, T4TOTAL, FREET4, T3FREE,  THYROIDAB in the last 72 hours. Anemia Panel: No results for input(s): VITAMINB12, FOLATE, FERRITIN, TIBC, IRON, RETICCTPCT in the last 72 hours. Sepsis Labs: No results for input(s): PROCALCITON, LATICACIDVEN in the last 168 hours.  No results found for this or any previous visit (from the past 240 hour(s)).   Radiology Studies: No results found.  Scheduled Meds: . aspirin EC  81 mg Oral Daily  . bisacodyl  10 mg Rectal Once  . brimonidine  1 drop Left Eye BID  . carvedilol  25 mg Oral BID WC  . Chlorhexidine Gluconate Cloth  6 each Topical Daily  . dorzolamide-timolol  1 drop Left Eye BID  . gabapentin  100 mg Oral TID  . gatifloxacin  1 drop Left Eye QID  . heparin  5,000 Units Subcutaneous Q8H  . hydrALAZINE  25 mg Oral TID  . insulin aspart  0-9 Units Subcutaneous TID WC  . latanoprost  1 drop Left Eye QHS  . senna-docusate  1 tablet Oral BID  . sodium bicarbonate  650 mg Oral Daily   Continuous Infusions: . sodium chloride 250 mL (10/16/19 0909)  . furosemide       LOS: 9 days   Marylu Lund, MD [facility] Pager On  Amion  If 7PM-7AM, please contact night-coverage 10/24/2019, 2:25 PM

## 2019-10-25 ENCOUNTER — Inpatient Hospital Stay (HOSPITAL_COMMUNITY): Payer: Medicaid Other

## 2019-10-25 LAB — GLUCOSE, CAPILLARY
Glucose-Capillary: 147 mg/dL — ABNORMAL HIGH (ref 70–99)
Glucose-Capillary: 193 mg/dL — ABNORMAL HIGH (ref 70–99)
Glucose-Capillary: 159 mg/dL — ABNORMAL HIGH (ref 70–99)
Glucose-Capillary: 253 mg/dL — ABNORMAL HIGH (ref 70–99)

## 2019-10-25 LAB — COMPREHENSIVE METABOLIC PANEL
ALT: 135 U/L — ABNORMAL HIGH (ref 0–44)
AST: 52 U/L — ABNORMAL HIGH (ref 15–41)
Albumin: 2.5 g/dL — ABNORMAL LOW (ref 3.5–5.0)
Alkaline Phosphatase: 241 U/L — ABNORMAL HIGH (ref 38–126)
Anion gap: 16 — ABNORMAL HIGH (ref 5–15)
BUN: 98 mg/dL — ABNORMAL HIGH (ref 6–20)
CO2: 21 mmol/L — ABNORMAL LOW (ref 22–32)
Calcium: 8.7 mg/dL — ABNORMAL LOW (ref 8.9–10.3)
Chloride: 96 mmol/L — ABNORMAL LOW (ref 98–111)
Creatinine, Ser: 5.26 mg/dL — ABNORMAL HIGH (ref 0.61–1.24)
GFR calc Af Amer: 14 mL/min — ABNORMAL LOW (ref >60)
GFR calc non Af Amer: 12 mL/min — ABNORMAL LOW (ref >60)
Glucose, Bld: 148 mg/dL — ABNORMAL HIGH (ref 70–99)
Potassium: 3.9 mmol/L (ref 3.5–5.1)
Sodium: 133 mmol/L — ABNORMAL LOW (ref 135–145)
Total Bilirubin: 1.1 mg/dL (ref 0.3–1.2)
Total Protein: 6.4 g/dL — ABNORMAL LOW (ref 6.5–8.1)

## 2019-10-25 LAB — BLOOD GAS, ARTERIAL
Acid-base deficit: 3.9 mmol/L — ABNORMAL HIGH (ref 0.0–2.0)
Bicarbonate: 22.5 mmol/L (ref 20.0–28.0)
Drawn by: 275531
FIO2: 32
O2 Saturation: 98.9 %
Patient temperature: 37
pCO2 arterial: 55.1 mmHg — ABNORMAL HIGH (ref 32.0–48.0)
pH, Arterial: 7.235 — ABNORMAL LOW (ref 7.350–7.450)
pO2, Arterial: 127 mmHg — ABNORMAL HIGH (ref 83.0–108.0)

## 2019-10-25 LAB — CBC WITH DIFFERENTIAL/PLATELET
Abs Immature Granulocytes: 0.13 10*3/uL — ABNORMAL HIGH (ref 0.00–0.07)
Basophils Absolute: 0 10*3/uL (ref 0.0–0.1)
Basophils Relative: 1 %
Eosinophils Absolute: 0.1 10*3/uL (ref 0.0–0.5)
Eosinophils Relative: 2 %
HCT: 26.9 % — ABNORMAL LOW (ref 39.0–52.0)
Hemoglobin: 8.3 g/dL — ABNORMAL LOW (ref 13.0–17.0)
Immature Granulocytes: 2 %
Lymphocytes Relative: 12 %
Lymphs Abs: 0.8 10*3/uL (ref 0.7–4.0)
MCH: 28.4 pg (ref 26.0–34.0)
MCHC: 30.9 g/dL (ref 30.0–36.0)
MCV: 92.1 fL (ref 80.0–100.0)
Monocytes Absolute: 0.9 10*3/uL (ref 0.1–1.0)
Monocytes Relative: 14 %
Neutro Abs: 4.7 10*3/uL (ref 1.7–7.7)
Neutrophils Relative %: 69 %
Platelets: 206 10*3/uL (ref 150–400)
RBC: 2.92 MIL/uL — ABNORMAL LOW (ref 4.22–5.81)
RDW: 15.7 % — ABNORMAL HIGH (ref 11.5–15.5)
WBC: 6.6 10*3/uL (ref 4.0–10.5)
nRBC: 0 % (ref 0.0–0.2)

## 2019-10-25 LAB — PHOSPHORUS: Phosphorus: 9 mg/dL — ABNORMAL HIGH (ref 2.5–4.6)

## 2019-10-25 MED ORDER — CALCIUM ACETATE (PHOS BINDER) 667 MG PO CAPS
1334.0000 mg | ORAL_CAPSULE | Freq: Three times a day (TID) | ORAL | Status: DC
  Administered 2019-10-25 – 2019-11-14 (×48): 1334 mg via ORAL
  Filled 2019-10-25 (×51): qty 2

## 2019-10-25 MED ORDER — METOLAZONE 5 MG PO TABS
5.0000 mg | ORAL_TABLET | Freq: Two times a day (BID) | ORAL | Status: DC
  Administered 2019-10-25 – 2019-10-27 (×5): 5 mg via ORAL
  Filled 2019-10-25 (×5): qty 1

## 2019-10-25 MED ORDER — DARBEPOETIN ALFA 100 MCG/0.5ML IJ SOSY
100.0000 ug | PREFILLED_SYRINGE | INTRAMUSCULAR | Status: DC
  Administered 2019-10-25: 100 ug via SUBCUTANEOUS
  Filled 2019-10-25 (×2): qty 0.5

## 2019-10-25 NOTE — Progress Notes (Signed)
PROGRESS NOTE    Christopher Davila  YYT:[id number] DOB: 05-08-1971 DOA: 10/13/2019 PCP: Marliss Coots, NP   Brief Narrative: 49 year old male with history of hypertension, diabetes mellitus/gastroparesis/blindness, CKD stage IV, chronic diastolic CHF admitted with diarrhea, weakness and failure to thrive. He was  recently admitted 4/10-12 for acute on chronic diastolic CHF.  He returned to the ER on 4/20 but did not qualify for hospitalization at that time and he has been not able to be placed in a SNF. In the ED had severe acidosis with a gap, given bicarb IV fluids and was admitted. Diarrhea has since resolved C. difficile negative GI panel negative.  Patient developed progressive fluid overload with lower leg edema acute on chronic diastolic CHF seen by nephrology managing diuretics.  Subjective:  Sleepy this am On 3l [place] RN reports " he's been like this and is bale to talk back " No acute events overnight.  Afebrile. Wt 175 pound from 174 and 168 lb 2 days ago Appears swollen, not much urine output on high dose lasix  Assessment & Plan:  Diarrhea: Resolved.  Work-up unremarkable.  Acute on chronic diastolic CHF/bilateral lower extremity edema/scrotal swelling: Did not respond to p.o. Lasix, and was on IV Lasix dose is being increased per nephrology currently on 160 mg 3 times daily.  Nephrology on board managing diuretics. Weight and I.o as below: Noted weight gain, output has not increased Wt today 79.4 kg, on admission 64.4? vs 75.8 kg kg on 4/28. Wt Readings from Last 3 Encounters:  10/25/19 79.4 kg  10/05/19 60.8 kg  09/27/19 66.9 kg    Intake/Output Summary (Last 24 hours) at 10/25/2019 0946 Last data filed at 10/25/2019 2751 Gross per 24 hour  Intake 917.74 ml  Output 400 ml  Net 517.74 ml   AKI/CKD stage IV/metabolic acidosis the setting of acute on chronic diastolic CHF and diuresis Baseline creatinine 3-3.5.  BUN/creatinine climbed with IV diuresis but patient was still  markedly volume overloaded and not making much urine on high dose lasix- nephrology managing diuretics, adding iv metolazone to iv lasix, may need HD and will also need HD cath too. Sodium bicarb to continue po. Recent Labs  Lab 10/21/19 0417 10/22/19 0528 10/23/19 0858 10/24/19 0419 10/25/19 0751  BUN 66* 72* 78* 84* 98*  CREATININE 3.84* 4.04* 4.12* 4.73* 5.26*   Acute Hypoxic Respiratory Failure: from CHF/Fluid overload- cont [place] o2, check abg, wean o2 as tolerated. CXR on 4/29 showed enlarging effusion and vascular congestion, repeat CXR portable and cont diuresis.  Sleepy- minimize sedatives. Check ABG.  Urine retention: Foley was placed here, will likely need to go home with Foley.  Type 2 diabetes mellitus: Blood sugar well controlled sliding scale insulin. Recent Labs  Lab 10/24/19 0614 10/24/19 1112 10/24/19 1606 10/24/19 2107 10/25/19 0611  GLUCAP 146* 144* 168* 181* 147*   Lab Results  Component Value Date   HGBA1C 6.4 (H) 09/19/2019   Essential hypertension: Controlled on Coreg 25 mg twice daily and hydralazine 25 mg 3 times daily  Hyperlipidemia: Continue statin  Anemia of chronic renal disease Hemoglobin more or less stable, s/p 1 unit prbc for hb 6.8. Monitor.   Recent Labs  Lab 10/22/19 0528 10/22/19 1636 10/23/19 0858 10/24/19 0419 10/25/19 0532  HGB 6.8* 8.3* 8.1* 8.1* 8.3*  HCT 23.2* 27.0* 26.2* 26.2* 26.9*   Generalized weakness/Debility: In the setting of CHF CKD. Pt eval  DVT prophylaxis:Heparin Code Status:full  Family Communication: plan of care discussed with patient at  bedside.  Status is: Inpatient Remains inpatient appropriate because:IV treatments appropriate due to intensity of illness or inability to take PO, Inpatient level of care appropriate due to severity of illness and at risk of death   Dispo: The patient is from: Home              Anticipated d/c is to: Home              Anticipated d/c date is: > 3 days               Patient currently is not medically stable to d/c.  Diet Order            Diet renal/carb modified with fluid restriction Diet-HS Snack? Nothing; Fluid restriction: 1200 mL Fluid; Room service appropriate? Yes; Fluid consistency: Thin  Diet effective now                Body mass index is 31 kg/m. Consultants:see note  Procedures:see note Microbiology:see note  Medications: Scheduled Meds: . aspirin EC  81 mg Oral Daily  . bisacodyl  10 mg Rectal Once  . brimonidine  1 drop Left Eye BID  . calcium acetate  1,334 mg Oral TID WC  . carvedilol  25 mg Oral BID WC  . Chlorhexidine Gluconate Cloth  6 each Topical Daily  . darbepoetin (ARANESP) injection - NON-DIALYSIS  100 mcg Subcutaneous Q Mon-1800  . dorzolamide-timolol  1 drop Left Eye BID  . gabapentin  100 mg Oral TID  . gatifloxacin  1 drop Left Eye QID  . heparin  5,000 Units Subcutaneous Q8H  . hydrALAZINE  25 mg Oral TID  . insulin aspart  0-9 Units Subcutaneous TID WC  . latanoprost  1 drop Left Eye QHS  . metolazone  5 mg Oral BID  . senna-docusate  1 tablet Oral BID  . sodium bicarbonate  650 mg Oral Daily   Continuous Infusions: . sodium chloride 250 mL (10/16/19 0909)  . furosemide 160 mg (10/25/19 0012)    Antimicrobials: Anti-infectives (From admission, onward)   None       Objective: Vitals: Today's Vitals   10/25/19 0005 10/25/19 0300 10/25/19 0353 10/25/19 0704  BP: 107/63  114/73   Pulse: 63  64   Resp:  20 18   Temp:   97.6 F (36.4 C)   TempSrc:   Oral   SpO2:   (!) 89% 100%  Weight:   79.4 kg   Height:      PainSc:        Intake/Output Summary (Last 24 hours) at 10/25/2019 0946 Last data filed at 10/25/2019 0918 Gross per 24 hour  Intake 917.74 ml  Output 400 ml  Net 517.74 ml   Filed Weights   10/23/19 0511 10/24/19 0502 10/25/19 0353  Weight: 76.2 kg 78.9 kg 79.4 kg   Weight change: 0.454 kg   Intake/Output from previous day: 05/09 0701 - 05/10 0700 In: 917.7 [P.O.:804;  IV Piggyback:113.7] Out: 500 [Urine:500] Intake/Output this shift: Total I/O In: 240 [P.O.:240] Out: -   Examination:  General exam: sleepy, wakes up and able to interact some, on 3l [place]  HEENT:Oral mucosa moist, Ear/Nose WNL grossly,dentition normal. Respiratory system: bilaterally diminished,no wheezing or crackles,no use of accessory muscle, non tender. Cardiovascular system: S1 & S2 +, regular, No JVD. Gastrointestinal system: Abdomen soft, NT,ND, BS+. Nervous System:Alert, awake, moving extremities and grossly nonfocal Extremities: b/l pitting edema and scrotal/penile edema +, distal peripheral pulses palpable.  Skin:  No rashes,no icterus. MSK: Normal muscle bulk,tone, power  Data Reviewed: I have personally reviewed following labs and imaging studies CBC: Recent Labs  Lab 10/21/19 0417 10/21/19 0417 10/22/19 0528 10/22/19 1636 10/23/19 0858 10/24/19 0419 10/25/19 0532  WBC 7.3  --  6.6  --  7.4 6.4 6.6  NEUTROABS 4.7  --  4.5  --  5.4 4.5 4.7  HGB 7.3*   < > 6.8* 8.3* 8.1* 8.1* 8.3*  HCT 24.4*   < > 23.2* 27.0* 26.2* 26.2* 26.9*  MCV 92.8  --  92.1  --  91.0 91.0 92.1  PLT 193  --  187  --  195 190 206   < > = values in this interval not displayed.   Basic Metabolic Panel: Recent Labs  Lab 10/21/19 0417 10/22/19 0528 10/23/19 0858 10/24/19 0419 10/25/19 0751  NA 136 134* 132* 133* 133*  K 4.1 4.0 3.5 3.7 3.9  CL 102 99 96* 98 96*  CO2 24 23 23 22  21*  GLUCOSE 159* 174* 194* 155* 148*  BUN 66* 72* 78* 84* 98*  CREATININE 3.84* 4.04* 4.12* 4.73* 5.26*  CALCIUM 8.7* 8.8* 8.6* 8.6* 8.7*  PHOS 6.5* 6.9* 7.4* 8.0* 9.0*   GFR: Estimated Creatinine Clearance: 16.2 mL/min (A) (by C-G formula based on SCr of 5.26 mg/dL (H)). Liver Function Tests: Recent Labs  Lab 10/21/19 0417 10/22/19 0528 10/23/19 0858 10/24/19 0419 10/25/19 0751  AST  --   --   --   --  52*  ALT  --   --   --   --  135*  ALKPHOS  --   --   --   --  241*  BILITOT  --   --   --   --   1.1  PROT  --   --   --   --  6.4*  ALBUMIN 2.3* 2.3* 2.4* 2.4* 2.5*   No results for input(s): LIPASE, AMYLASE in the last 168 hours. No results for input(s): AMMONIA in the last 168 hours. Coagulation Profile: No results for input(s): INR, PROTIME in the last 168 hours. Cardiac Enzymes: No results for input(s): CKTOTAL, CKMB, CKMBINDEX, TROPONINI in the last 168 hours. BNP (last 3 results) No results for input(s): PROBNP in the last 8760 hours. HbA1C: No results for input(s): HGBA1C in the last 72 hours. CBG: Recent Labs  Lab 10/24/19 0614 10/24/19 1112 10/24/19 1606 10/24/19 2107 10/25/19 0611  GLUCAP 146* 144* 168* 181* 147*   Lipid Profile: No results for input(s): CHOL, HDL, LDLCALC, TRIG, CHOLHDL, LDLDIRECT in the last 72 hours. Thyroid Function Tests: No results for input(s): TSH, T4TOTAL, FREET4, T3FREE, THYROIDAB in the last 72 hours. Anemia Panel: No results for input(s): VITAMINB12, FOLATE, FERRITIN, TIBC, IRON, RETICCTPCT in the last 72 hours. Sepsis Labs: No results for input(s): PROCALCITON, LATICACIDVEN in the last 168 hours.  No results found for this or any previous visit (from the past 240 hour(s)).    Radiology Studies: No results found.   LOS: 10 days   Time spent: More than 50% of that time was spent in counseling and/or coordination of care.  Antonieta Pert, MD [facility]  10/25/2019, 9:46 AM

## 2019-10-25 NOTE — Progress Notes (Signed)
PT Cancellation Note  Patient Details Name: Christopher Davila MRN: [id number] DOB: Dec 07, 1970   Cancelled Treatment:    Reason Eval/Treat Not Completed: Fatigue/lethargy limiting ability to participate. Pt sleeping soundly. Unable to rouse for therapy session. NT reports pt just received pain medicine and it usually knocks him out. PT to re-attempt as time allows.   Lorriane Shire 10/25/2019, 11:56 AM   Lorrin Goodell, PT  Office # [phone] Pager [phone]

## 2019-10-25 NOTE — Progress Notes (Signed)
Patient ID: Christopher Davila, male   DOB: 26-Sep-1970, 49 y.o.   MRN: [id number] S. Eyes closed.  Says that his hearing is down today.   Less UOP with lasix 160 TID- BUN and crt cont to rise -  He ate all of his breakfast    O:BP 114/73 (BP Location: Right Arm)   Pulse 64   Temp 97.6 F (36.4 C) (Oral)   Resp 18   Ht 5\' 3"  (1.6 m)   Wt 79.4 kg   SpO2 100%   BMI 31.00 kg/m   Intake/Output Summary (Last 24 hours) at 10/25/2019 0841 Last data filed at 10/25/2019 0400 Gross per 24 hour  Intake 917.74 ml  Output 400 ml  Net 517.74 ml   Intake/Output: I/O last 3 completed shifts: In: 1037.7 [P.O.:924; IV Piggyback:113.7] Out: 725 [Urine:725]  Intake/Output this shift:  No intake/output data recorded. Weight change: 0.454 kg Gen: NAD- eyes closed-  Not focusing on the conversation CVS: no rub Resp: occ rhonchi Abd: +BS, soft, NT/ND Ext: 2+ edema  Recent Labs  Lab 10/19/19 0427 10/20/19 0529 10/21/19 0417 10/22/19 0528 10/23/19 0858 10/24/19 0419 10/25/19 0751  NA 136 138 136 134* 132* 133* 133*  K 3.8 4.1 4.1 4.0 3.5 3.7 3.9  CL 104 105 102 99 96* 98 96*  CO2 24 22 24 23 23 22  21*  GLUCOSE 141* 121* 159* 174* 194* 155* 148*  BUN 61* 62* 66* 72* 78* 84* 98*  CREATININE 3.55* 3.75* 3.84* 4.04* 4.12* 4.73* 5.26*  ALBUMIN  --  2.3* 2.3* 2.3* 2.4* 2.4* 2.5*  CALCIUM 8.7* 8.8* 8.7* 8.8* 8.6* 8.6* 8.7*  PHOS  --  6.6* 6.5* 6.9* 7.4* 8.0* 9.0*  AST  --   --   --   --   --   --  52*  ALT  --   --   --   --   --   --  135*   Liver Function Tests: Recent Labs  Lab 10/23/19 0858 10/24/19 0419 10/25/19 0751  AST  --   --  52*  ALT  --   --  135*  ALKPHOS  --   --  241*  BILITOT  --   --  1.1  PROT  --   --  6.4*  ALBUMIN 2.4* 2.4* 2.5*   No results for input(s): LIPASE, AMYLASE in the last 168 hours. No results for input(s): AMMONIA in the last 168 hours. CBC: Recent Labs  Lab 10/21/19 0417 10/21/19 0417 10/22/19 0528 10/22/19 1636 10/23/19 0858 10/24/19 0419  10/25/19 0532  WBC 7.3   < > 6.6  --  7.4 6.4 6.6  NEUTROABS 4.7   < > 4.5  --  5.4 4.5 4.7  HGB 7.3*   < > 6.8*   < > 8.1* 8.1* 8.3*  HCT 24.4*   < > 23.2*   < > 26.2* 26.2* 26.9*  MCV 92.8  --  92.1  --  91.0 91.0 92.1  PLT 193   < > 187  --  195 190 206   < > = values in this interval not displayed.   Cardiac Enzymes: No results for input(s): CKTOTAL, CKMB, CKMBINDEX, TROPONINI in the last 168 hours. CBG: Recent Labs  Lab 10/24/19 0614 10/24/19 1112 10/24/19 1606 10/24/19 2107 10/25/19 0611  GLUCAP 146* 144* 168* 181* 147*    Iron Studies: No results for input(s): IRON, TIBC, TRANSFERRIN, FERRITIN in the last 72 hours. Studies/Results: No results found. Marland Kitchen aspirin  EC  81 mg Oral Daily  . bisacodyl  10 mg Rectal Once  . brimonidine  1 drop Left Eye BID  . carvedilol  25 mg Oral BID WC  . Chlorhexidine Gluconate Cloth  6 each Topical Daily  . dorzolamide-timolol  1 drop Left Eye BID  . gabapentin  100 mg Oral TID  . gatifloxacin  1 drop Left Eye QID  . heparin  5,000 Units Subcutaneous Q8H  . hydrALAZINE  25 mg Oral TID  . insulin aspart  0-9 Units Subcutaneous TID WC  . latanoprost  1 drop Left Eye QHS  . senna-docusate  1 tablet Oral BID  . sodium bicarbonate  650 mg Oral Daily    BMET    Component Value Date/Time   NA 133 (L) 10/25/2019 0751   K 3.9 10/25/2019 0751   CL 96 (L) 10/25/2019 0751   CO2 21 (L) 10/25/2019 0751   GLUCOSE 148 (H) 10/25/2019 0751   BUN 98 (H) 10/25/2019 0751   CREATININE 5.26 (H) 10/25/2019 0751   CALCIUM 8.7 (L) 10/25/2019 0751   GFRNONAA 12 (L) 10/25/2019 0751   GFRAA 14 (L) 10/25/2019 0751   CBC    Component Value Date/Time   WBC 6.6 10/25/2019 0532   RBC 2.92 (L) 10/25/2019 0532   HGB 8.3 (L) 10/25/2019 0532   HCT 26.9 (L) 10/25/2019 0532   PLT 206 10/25/2019 0532   MCV 92.1 10/25/2019 0532   MCH 28.4 10/25/2019 0532   MCHC 30.9 10/25/2019 0532   RDW 15.7 (H) 10/25/2019 0532   LYMPHSABS 0.8 10/25/2019 0532   MONOABS  0.9 10/25/2019 0532   EOSABS 0.1 10/25/2019 0532   BASOSABS 0.0 10/25/2019 0532    Assessment/Plan:  1. AKI/CKD stage IV- in setting of acute on chronic diastolic CHF and diuresis. His baselineis3-3.5 and is followed by Dr. Carolin Sicks in our office.  1. BUN/Cr continue to climb with IV diuresis, however remains markedly volume overloaded and is not making much urine.  I am very concerned that he will require initiation of HD this hospitalization.  I dont think he realizes the gravity of this situation.  Will add metolazone to IV lasix but if that fails will look to place [facility] and initiate dialysis mid to late week   2. Acute on chronic diastolic CHF-Did not respond topo lasix 80 mg dailysohave changed to IV lasix and titrating dose up.  Will add metolazone today 3. Urinary retention- s/p foley catheter and will likely need to be discharged with this and follow up with Urology as an outpatient. Very small amounts of concentrated urine in foley 4. Anemia of CKD stage IV- hgbdropped to 6.8 and receiving a unit of PRBC's at this time.Received 2 doses IV feraheme on 5/1 and Aranesp 40 mcg on 4/30. will inc dose of ESA 5. DM type 2 - per primary svc 6. Bones-  Will add binder and check PTH  7. Hyponatremia-  Hypervolemic hyponatremia-  Diuretics- if fails , then dialysis    [place] [phone]

## 2019-10-26 ENCOUNTER — Inpatient Hospital Stay (HOSPITAL_COMMUNITY): Payer: Medicaid Other

## 2019-10-26 DIAGNOSIS — N186 End stage renal disease: Secondary | ICD-10-CM

## 2019-10-26 DIAGNOSIS — I5033 Acute on chronic diastolic (congestive) heart failure: Secondary | ICD-10-CM

## 2019-10-26 DIAGNOSIS — N185 Chronic kidney disease, stage 5: Secondary | ICD-10-CM

## 2019-10-26 LAB — RENAL FUNCTION PANEL
Albumin: 2.4 g/dL — ABNORMAL LOW (ref 3.5–5.0)
Anion gap: 17 — ABNORMAL HIGH (ref 5–15)
BUN: 101 mg/dL — ABNORMAL HIGH (ref 6–20)
CO2: 20 mmol/L — ABNORMAL LOW (ref 22–32)
Calcium: 8.6 mg/dL — ABNORMAL LOW (ref 8.9–10.3)
Chloride: 94 mmol/L — ABNORMAL LOW (ref 98–111)
Creatinine, Ser: 5.84 mg/dL — ABNORMAL HIGH (ref 0.61–1.24)
GFR calc Af Amer: 12 mL/min — ABNORMAL LOW (ref >60)
GFR calc non Af Amer: 11 mL/min — ABNORMAL LOW (ref >60)
Glucose, Bld: 209 mg/dL — ABNORMAL HIGH (ref 70–99)
Phosphorus: 9.5 mg/dL — ABNORMAL HIGH (ref 2.5–4.6)
Potassium: 3.8 mmol/L (ref 3.5–5.1)
Sodium: 131 mmol/L — ABNORMAL LOW (ref 135–145)

## 2019-10-26 LAB — GLUCOSE, CAPILLARY
Glucose-Capillary: 198 mg/dL — ABNORMAL HIGH (ref 70–99)
Glucose-Capillary: 178 mg/dL — ABNORMAL HIGH (ref 70–99)
Glucose-Capillary: 195 mg/dL — ABNORMAL HIGH (ref 70–99)
Glucose-Capillary: 236 mg/dL — ABNORMAL HIGH (ref 70–99)

## 2019-10-26 LAB — CBC WITH DIFFERENTIAL/PLATELET
Abs Immature Granulocytes: 0.21 10*3/uL — ABNORMAL HIGH (ref 0.00–0.07)
Basophils Absolute: 0 10*3/uL (ref 0.0–0.1)
Basophils Relative: 0 %
Eosinophils Absolute: 0.1 10*3/uL (ref 0.0–0.5)
Eosinophils Relative: 1 %
HCT: 26.9 % — ABNORMAL LOW (ref 39.0–52.0)
Hemoglobin: 8.4 g/dL — ABNORMAL LOW (ref 13.0–17.0)
Immature Granulocytes: 4 %
Lymphocytes Relative: 11 %
Lymphs Abs: 0.6 10*3/uL — ABNORMAL LOW (ref 0.7–4.0)
MCH: 28.6 pg (ref 26.0–34.0)
MCHC: 31.2 g/dL (ref 30.0–36.0)
MCV: 91.5 fL (ref 80.0–100.0)
Monocytes Absolute: 0.8 10*3/uL (ref 0.1–1.0)
Monocytes Relative: 15 %
Neutro Abs: 3.6 10*3/uL (ref 1.7–7.7)
Neutrophils Relative %: 69 %
Platelets: 215 10*3/uL (ref 150–400)
RBC: 2.94 MIL/uL — ABNORMAL LOW (ref 4.22–5.81)
RDW: 15.7 % — ABNORMAL HIGH (ref 11.5–15.5)
WBC: 5.2 10*3/uL (ref 4.0–10.5)
nRBC: 0.4 % — ABNORMAL HIGH (ref 0.0–0.2)

## 2019-10-26 NOTE — Progress Notes (Addendum)
Patient ID: Christopher Davila, male   DOB: 06/14/1971, 49 y.o.   MRN: [id number] S. Eyes closed.  Says that his hearing is down still today.   Less UOP with lasix 160 TID plus metolazone- BUN and crt cont to rise -  I discussed with him yesterday the probability of dialysis-   He has a cousin who helps him-  I tried to reach out to her but could not get through    O:BP 98/65 (BP Location: Right Arm)   Pulse (!) 55   Temp 97.6 F (36.4 C) (Oral)   Resp 19   Ht 5\' 3"  (1.6 m)   Wt 79.8 kg   SpO2 90%   BMI 31.18 kg/m   Intake/Output Summary (Last 24 hours) at 10/26/2019 0832 Last data filed at 10/26/2019 0010 Gross per 24 hour  Intake 480 ml  Output 250 ml  Net 230 ml   Intake/Output: I/O last 3 completed shifts: In: 666 [P.O.:600; IV Piggyback:66] Out: 650 [Urine:650]  Intake/Output this shift:  No intake/output data recorded. Weight change: 0.454 kg Gen: NAD- eyes closed-  Seems to understand limited amounts CVS: no rub Resp: occ rhonchi Abd: +BS, soft, NT/ND Ext: 2+ edema  Recent Labs  Lab 10/20/19 0529 10/21/19 0417 10/22/19 0528 10/23/19 0858 10/24/19 0419 10/25/19 0751 10/26/19 0611  NA 138 136 134* 132* 133* 133* 131*  K 4.1 4.1 4.0 3.5 3.7 3.9 3.8  CL 105 102 99 96* 98 96* 94*  CO2 22 24 23 23 22  21* 20*  GLUCOSE 121* 159* 174* 194* 155* 148* 209*  BUN 62* 66* 72* 78* 84* 98* 101*  CREATININE 3.75* 3.84* 4.04* 4.12* 4.73* 5.26* 5.84*  ALBUMIN 2.3* 2.3* 2.3* 2.4* 2.4* 2.5* 2.4*  CALCIUM 8.8* 8.7* 8.8* 8.6* 8.6* 8.7* 8.6*  PHOS 6.6* 6.5* 6.9* 7.4* 8.0* 9.0* 9.5*  AST  --   --   --   --   --  52*  --   ALT  --   --   --   --   --  135*  --    Liver Function Tests: Recent Labs  Lab 10/24/19 0419 10/25/19 0751 10/26/19 0611  AST  --  52*  --   ALT  --  135*  --   ALKPHOS  --  241*  --   BILITOT  --  1.1  --   PROT  --  6.4*  --   ALBUMIN 2.4* 2.5* 2.4*   No results for input(s): LIPASE, AMYLASE in the last 168 hours. No results for input(s): AMMONIA in the  last 168 hours. CBC: Recent Labs  Lab 10/22/19 0528 10/22/19 1636 10/23/19 0858 10/23/19 0858 10/24/19 0419 10/25/19 0532 10/26/19 0611  WBC 6.6  --  7.4   < > 6.4 6.6 5.2  NEUTROABS 4.5  --  5.4   < > 4.5 4.7 3.6  HGB 6.8*   < > 8.1*   < > 8.1* 8.3* 8.4*  HCT 23.2*   < > 26.2*   < > 26.2* 26.9* 26.9*  MCV 92.1  --  91.0  --  91.0 92.1 91.5  PLT 187  --  195   < > 190 206 215   < > = values in this interval not displayed.   Cardiac Enzymes: No results for input(s): CKTOTAL, CKMB, CKMBINDEX, TROPONINI in the last 168 hours. CBG: Recent Labs  Lab 10/25/19 0611 10/25/19 1134 10/25/19 1557 10/25/19 2100 10/26/19 0558  GLUCAP 147* 193* 159* 253*  198*    Iron Studies: No results for input(s): IRON, TIBC, TRANSFERRIN, FERRITIN in the last 72 hours. Studies/Results: DG Chest Port 1 View  Result Date: 10/25/2019 CLINICAL DATA:  Bilateral leg swelling EXAM: PORTABLE CHEST 1 VIEW COMPARISON:  For 03/06/2020 FINDINGS: Progressive diffuse bilateral airspace disease. Progressive bilateral pleural effusions and bibasilar atelectasis. Mild cardiac enlargement. IMPRESSION: Progression of congestive heart failure with edema. Enlarging bilateral pleural effusions. Electronically Signed   By: Franchot Gallo M.D.   On: 10/25/2019 11:34   . aspirin EC  81 mg Oral Daily  . bisacodyl  10 mg Rectal Once  . brimonidine  1 drop Left Eye BID  . calcium acetate  1,334 mg Oral TID WC  . carvedilol  25 mg Oral BID WC  . Chlorhexidine Gluconate Cloth  6 each Topical Daily  . darbepoetin (ARANESP) injection - NON-DIALYSIS  100 mcg Subcutaneous Q Mon-1800  . dorzolamide-timolol  1 drop Left Eye BID  . gabapentin  100 mg Oral TID  . gatifloxacin  1 drop Left Eye QID  . heparin  5,000 Units Subcutaneous Q8H  . hydrALAZINE  25 mg Oral TID  . insulin aspart  0-9 Units Subcutaneous TID WC  . latanoprost  1 drop Left Eye QHS  . metolazone  5 mg Oral BID  . senna-docusate  1 tablet Oral BID  . sodium  bicarbonate  650 mg Oral Daily    BMET    Component Value Date/Time   NA 131 (L) 10/26/2019 0611   K 3.8 10/26/2019 0611   CL 94 (L) 10/26/2019 0611   CO2 20 (L) 10/26/2019 0611   GLUCOSE 209 (H) 10/26/2019 0611   BUN 101 (H) 10/26/2019 0611   CREATININE 5.84 (H) 10/26/2019 0611   CALCIUM 8.6 (L) 10/26/2019 0611   GFRNONAA 11 (L) 10/26/2019 0611   GFRAA 12 (L) 10/26/2019 0611   CBC    Component Value Date/Time   WBC 5.2 10/26/2019 0611   RBC 2.94 (L) 10/26/2019 0611   HGB 8.4 (L) 10/26/2019 0611   HCT 26.9 (L) 10/26/2019 0611   PLT 215 10/26/2019 0611   MCV 91.5 10/26/2019 0611   MCH 28.6 10/26/2019 0611   MCHC 31.2 10/26/2019 0611   RDW 15.7 (H) 10/26/2019 0611   LYMPHSABS 0.6 (L) 10/26/2019 0611   MONOABS 0.8 10/26/2019 0611   EOSABS 0.1 10/26/2019 0611   BASOSABS 0.0 10/26/2019 0611    Assessment/Plan:  1. AKI/CKD stage IV- in setting of acute on chronic diastolic CHF and diuresis. His baselineis3-3.5 and is followed by Dr. Carolin Sicks at [facility].  BUN/Cr continue to climb with attempted IV diuresis, remains markedly volume overloaded and is not making much urine.  I think we just need to go ahead and initiate HD this hospitalization.  I dont think he completely realizes the gravity of this situation.   will call vascular and Davila to place TDC/AVF and initiate dialysis this week - will continue to attempt to contact pts cousin   2. Acute on chronic diastolic CHF-not responding to high dose diuretics 3. Urinary retention- s/p foley catheter and will likely need to be discharged with this and follow up with Urology as an outpatient. Very small amounts of concentrated urine in foley 4. Anemia of CKD stage IV- hgbdropped to 6.8 and received a unit of PRBC's at this time.Received 2 doses IV feraheme on 5/1 and Aranesp 40 mcg on 4/30. will inc dose of ESA- is stable 5. DM type 2 - per primary svc  6. Bones-  added binder and check PTH  7. Hyponatremia-  Hypervolemic  hyponatremia-  Diuretics- if fails , then dialysis    [place] [phone]  Addendum at [facility]-  I was able to talk with pts cousin Christopher Davila  [phone].  I told her of my intention to start dialysis therapy on Christopher Davila and the reasons for doing that.  She understood and gave permission to proceed.  She told me that most recently Christopher Davila has been homeless.  I am not sure that he would be able to live on his own with his medical issues and visual deficits

## 2019-10-26 NOTE — Progress Notes (Signed)
PROGRESS NOTE    Christopher Davila  ZOX:[id number] DOB: 08/23/1970 DOA: 10/13/2019 PCP: Marliss Coots, NP   Brief Narrative: 49 year old male with history of hypertension, diabetes mellitus/gastroparesis/blindness, CKD stage IV, chronic diastolic CHF admitted with diarrhea, weakness and failure to thrive. He was  recently admitted 4/10-12 for acute on chronic diastolic CHF.  He returned to the ER on 4/20 but did not qualify for hospitalization at that time and he has been not able to be placed in a SNF. In the ED had severe acidosis with a gap, given bicarb IV fluids and was admitted. Diarrhea has since resolved C. difficile negative GI panel negative.  Patient developed progressive fluid overload with lower leg edema acute on chronic diastolic CHF seen by nephrology managing diuretics. Patient remains fluid overloaded despite aggressive high-dose IV Lasix  Subjective:  Nursing feeing him,  c/o decreased hearing. No shortness of breaht, feels okay on 3 L [place]. " I will have to be in dialysis, I have no choice" Afebrile overnight T-max 97.8. BUN/Cr uptrending. Wt 175>176 pound from 174 and 168 lb 3 days ago Appears swollen, not much urine output   Assessment & Plan:  Acute on chronic diastolic CHF/bilateral lower extremity edema/scrotal swelling: Did not respond to p.o. Lasix, and was on IV Lasix dose is being increased per nephrology currently on 160 mg 3 times daily.  Metolazone added to augment diuresis but patient without significant increase in urine output. Nephrology on board managing diuretics. Weight and I/O as below: Wt today 79.8 kg from 79/4 kg;on admission was 64.4? vs 75.8 kg kg on 4/28. Wt Readings from Last 3 Encounters:  10/26/19 79.8 kg  10/05/19 60.8 kg  09/27/19 66.9 kg    Intake/Output Summary (Last 24 hours) at 10/26/2019 0947 Last data filed at 10/26/2019 0946 Gross per 24 hour  Intake 480 ml  Output 250 ml  Net 230 ml   AKI/CKD stage IV/metabolic acidosis the  setting of acute on chronic diastolic CHF and diuresis Baseline creatinine 3-3.5.  BUN/creatinine climbed with IV diuresis but patient was still markedly volume overloaded and not making much urine on high dose lasix- nephrology managing diuretics, added metolazone 10/25/19 to iv lasix, may need HD.  Bicarb stable at 20, cont po bicarb. Recent Labs  Lab 10/22/19 0528 10/23/19 0858 10/24/19 0419 10/25/19 0751 10/26/19 0611  BUN 72* 78* 84* 98* 101*  CREATININE 4.04* 4.12* 4.73* 5.26* 5.84*   Hyponatremia sodium slightly low at 131 continue diuresis.  Monitor  Acute Hypoxic Respiratory Failure: from Fluid overload- cont  o2. CXR on 4/29 showed enlarging effusion and vascular congestion, repeat CXR 5/10-shows progression of congestive heart failure with edema enlarging bilateral pleural effusions.Cont diuresis as above. Check BNP. lvef Normal in echo in 2020.  Sleepy-minimize sedatives.I will hold off on pain medication. ABG shows PCO2 overall stable at 55,has respiratory/metabolic acidosis on ABG.  Diarrhea:Resolved.Work-up unremarkable.  Urine retention:Foley was placed here,will likely need to go home with Foley.  Type 2 diabetes mellitus:Blood sugar well controlled sliding scale insulin.  Last A1c well controlled 6.4 09/19/2019 Recent Labs  Lab 10/25/19 0611 10/25/19 1134 10/25/19 1557 10/25/19 2100 10/26/19 0558  GLUCAP 147* 193* 159* 253* 198*   Essential hypertension:BP is soft in 90s intermittently heart rate in high 50s to low 60s-On Coreg 25 mg twice daily and stopped today-reassess for need in coming days. On Hydralazine 25 mg 3 times daily.Monitor.  Hyperlipidemia:Continue statin.  Anemia of chronic renal disease Hemoglobin more or less stable,s/p 1 unit  prbc for hb 6.8.Monitor.   Recent Labs  Lab 10/22/19 1636 10/23/19 0858 10/24/19 0419 10/25/19 0532 10/26/19 0611  HGB 8.3* 8.1* 8.1* 8.3* 8.4*  HCT 27.0* 26.2* 26.2* 26.9* 26.9*   Generalized weakness/Debility:  In the setting of CHF CKD. PT eval.  DVT prophylaxis:Heparin Code Status:full  Family Communication: plan of care discussed with patient at bedside.  Status is: Inpatient Remains inpatient appropriate because:IV treatments appropriate due to intensity of illness or inability to take PO, Inpatient level of care appropriate due to severity of illness and at risk of death  Dispo: The patient is from: Home              Anticipated d/c is to: Home              Anticipated d/c date is: > 3 days              Patient currently is not medically stable to d/c.  Diet Order            Diet renal/carb modified with fluid restriction Diet-HS Snack? Nothing; Fluid restriction: 1200 mL Fluid; Room service appropriate? Yes; Fluid consistency: Thin  Diet effective now             Body mass index is 31.18 kg/m. Consultants:see note  Procedures:see note Microbiology:see note  Medications: Scheduled Meds: . aspirin EC  81 mg Oral Daily  . bisacodyl  10 mg Rectal Once  . brimonidine  1 drop Left Eye BID  . calcium acetate  1,334 mg Oral TID WC  . carvedilol  25 mg Oral BID WC  . Chlorhexidine Gluconate Cloth  6 each Topical Daily  . darbepoetin (ARANESP) injection - NON-DIALYSIS  100 mcg Subcutaneous Q Mon-1800  . dorzolamide-timolol  1 drop Left Eye BID  . gabapentin  100 mg Oral TID  . gatifloxacin  1 drop Left Eye QID  . heparin  5,000 Units Subcutaneous Q8H  . hydrALAZINE  25 mg Oral TID  . insulin aspart  0-9 Units Subcutaneous TID WC  . latanoprost  1 drop Left Eye QHS  . metolazone  5 mg Oral BID  . senna-docusate  1 tablet Oral BID  . sodium bicarbonate  650 mg Oral Daily   Continuous Infusions: . sodium chloride 250 mL (10/16/19 0909)  . furosemide 160 mg (10/26/19 1191)    Antimicrobials: Anti-infectives (From admission, onward)   None       Objective: Vitals: Today's Vitals   10/26/19 0010 10/26/19 0341 10/26/19 0515 10/26/19 0908  BP:   98/65   Pulse:   (!) 55  (!) 59  Resp:   19   Temp:      TempSrc:      SpO2:   90%   Weight: 79.8 kg     Height:      PainSc:  Asleep      Intake/Output Summary (Last 24 hours) at 10/26/2019 0947 Last data filed at 10/26/2019 0946 Gross per 24 hour  Intake 480 ml  Output 250 ml  Net 230 ml   Filed Weights   10/24/19 0502 10/25/19 0353 10/26/19 0010  Weight: 78.9 kg 79.4 kg 79.8 kg   Weight change: 0.454 kg   Intake/Output from previous day: 05/10 0701 - 05/11 0700 In: 480 [P.O.:480] Out: 250 [Urine:250] Intake/Output this shift: Total I/O In: 240 [P.O.:240] Out: -   Examination:  General exam: Alert awake, on [place],getting fed by the nursing staff this morning.   HEENT:Oral mucosa moist,  Ear/Nose WNL grossly,dentition normal. Respiratory system: bilaterally crackles +, no use of accessory muscle, non tender. Cardiovascular system: S1 & S2 +, regular, No JVD. Gastrointestinal system: Abdomen soft, NT,ND, BS+. Nervous System:Alert, awake, moving extremities and grossly nonfocal. Extremities: b/l pitting edema in Lower legs, scrotal/penile edema +, distal peripheral pulses palpable.  Skin: No rashes,no icterus. MSK: Normal muscle bulk,tone, power.  Data Reviewed: I have personally reviewed following labs and imaging studies CBC: Recent Labs  Lab 10/22/19 0528 10/22/19 0528 10/22/19 1636 10/23/19 0858 10/24/19 0419 10/25/19 0532 10/26/19 0611  WBC 6.6  --   --  7.4 6.4 6.6 5.2  NEUTROABS 4.5  --   --  5.4 4.5 4.7 3.6  HGB 6.8*   < > 8.3* 8.1* 8.1* 8.3* 8.4*  HCT 23.2*   < > 27.0* 26.2* 26.2* 26.9* 26.9*  MCV 92.1  --   --  91.0 91.0 92.1 91.5  PLT 187  --   --  195 190 206 215   < > = values in this interval not displayed.   Basic Metabolic Panel: Recent Labs  Lab 10/22/19 0528 10/23/19 0858 10/24/19 0419 10/25/19 0751 10/26/19 0611  NA 134* 132* 133* 133* 131*  K 4.0 3.5 3.7 3.9 3.8  CL 99 96* 98 96* 94*  CO2 23 23 22  21* 20*  GLUCOSE 174* 194* 155* 148* 209*  BUN 72* 78*  84* 98* 101*  CREATININE 4.04* 4.12* 4.73* 5.26* 5.84*  CALCIUM 8.8* 8.6* 8.6* 8.7* 8.6*  PHOS 6.9* 7.4* 8.0* 9.0* 9.5*   GFR: Estimated Creatinine Clearance: 14.6 mL/min (A) (by C-G formula based on SCr of 5.84 mg/dL (H)). Liver Function Tests: Recent Labs  Lab 10/22/19 0528 10/23/19 0858 10/24/19 0419 10/25/19 0751 10/26/19 0611  AST  --   --   --  52*  --   ALT  --   --   --  135*  --   ALKPHOS  --   --   --  241*  --   BILITOT  --   --   --  1.1  --   PROT  --   --   --  6.4*  --   ALBUMIN 2.3* 2.4* 2.4* 2.5* 2.4*   No results for input(s): LIPASE, AMYLASE in the last 168 hours. No results for input(s): AMMONIA in the last 168 hours. Coagulation Profile: No results for input(s): INR, PROTIME in the last 168 hours. Cardiac Enzymes: No results for input(s): CKTOTAL, CKMB, CKMBINDEX, TROPONINI in the last 168 hours. BNP (last 3 results) No results for input(s): PROBNP in the last 8760 hours. HbA1C: No results for input(s): HGBA1C in the last 72 hours. CBG: Recent Labs  Lab 10/25/19 0611 10/25/19 1134 10/25/19 1557 10/25/19 2100 10/26/19 0558  GLUCAP 147* 193* 159* 253* 198*   Lipid Profile: No results for input(s): CHOL, HDL, LDLCALC, TRIG, CHOLHDL, LDLDIRECT in the last 72 hours. Thyroid Function Tests: No results for input(s): TSH, T4TOTAL, FREET4, T3FREE, THYROIDAB in the last 72 hours. Anemia Panel: No results for input(s): VITAMINB12, FOLATE, FERRITIN, TIBC, IRON, RETICCTPCT in the last 72 hours. Sepsis Labs: No results for input(s): PROCALCITON, LATICACIDVEN in the last 168 hours.  No results found for this or any previous visit (from the past 240 hour(s)).    Radiology Studies: DG Chest Port 1 View  Result Date: 10/25/2019 CLINICAL DATA:  Bilateral leg swelling EXAM: PORTABLE CHEST 1 VIEW COMPARISON:  For 03/06/2020 FINDINGS: Progressive diffuse bilateral airspace disease. Progressive bilateral pleural effusions  and bibasilar atelectasis. Mild cardiac  enlargement. IMPRESSION: Progression of congestive heart failure with edema. Enlarging bilateral pleural effusions. Electronically Signed   By: Franchot Gallo M.D.   On: 10/25/2019 11:34     LOS: 11 days   Time spent: More than 50% of that time was spent in counseling and/or coordination of care.  Antonieta Pert, MD [facility]  10/26/2019, 9:47 AM

## 2019-10-26 NOTE — Progress Notes (Signed)
PT Cancellation Note  Patient Details Name: Nirav Sweda MRN: [id number] DOB: [birth year]   Cancelled Treatment:    Reason Eval/Treat Not Completed: Patient declined, no reason specified. Despite multiple attempts and encouragement to participate he continues to decline PT today. Will re-attempt tomorrow.     Cate Oravec 10/26/2019, 1:48 PM

## 2019-10-26 NOTE — Consult Note (Addendum)
[name] NOTE   MRN : [id number]  Reason for Consult: ESRD Referring Physician: Moshe Cipro   History of Present Illness: [name] is a 49 year old gentleman with a history of diabetes, hypertension, CKD stage IV who presented to the hospital with diarrhea. Note that he had a recent admission with acute on chronic diastolic CHF.  Now AKI on CKD.  He is legally blind and has hearing difficulties.  We have been consulted to provide [facility] and access.  Vein mapping has been ordered.     Current Facility-Administered Medications  Medication Dose Route Frequency Provider Last Rate Last Admin  . 0.9 %  sodium chloride infusion   Intravenous PRN Alma Friendly, MD 10 mL/hr at 10/16/19 0909 250 mL at 10/16/19 0909  . acetaminophen (TYLENOL) tablet 650 mg  650 mg Oral Q6H PRN Karmen Bongo, MD   650 mg at 10/14/19 2121   Or  . acetaminophen (TYLENOL) suppository 650 mg  650 mg Rectal Q6H PRN Karmen Bongo, MD      . aspirin EC tablet 81 mg  81 mg Oral Daily Karmen Bongo, MD   81 mg at 10/26/19 0907  . bisacodyl (DULCOLAX) suppository 10 mg  10 mg Rectal Once Alma Friendly, MD      . brimonidine (ALPHAGAN) 0.15 % ophthalmic solution 1 drop  1 drop Left Eye BID Karmen Bongo, MD   1 drop at 10/26/19 0910  . calcium acetate (PHOSLO) capsule 1,334 mg  1,334 mg Oral TID WC Corliss Parish, MD   1,334 mg at 10/26/19 0539  . carvedilol (COREG) tablet 25 mg  25 mg Oral BID WC Alma Friendly, MD   25 mg at 10/26/19 0908  . Chlorhexidine Gluconate Cloth 2 % PADS 6 each  6 each Topical Daily Karmen Bongo, MD   6 each at 10/25/19 [phone]  . Darbepoetin Alfa (ARANESP) injection 100 mcg  100 mcg Subcutaneous Q Mon-1800 Corliss Parish, MD   100 mcg at 10/25/19 1632  . dorzolamide-timolol (COSOPT) 22.3-6.8 MG/ML ophthalmic solution 1 drop  1 drop Left Eye BID Karmen Bongo, MD   1 drop at 10/26/19 0910  . furosemide (LASIX) 160 mg  in dextrose 5 % 50 mL IVPB  160 mg Intravenous TID Donato Heinz, MD 62 mL/hr at 10/26/19 0918 160 mg at 10/26/19 0918  . gabapentin (NEURONTIN) capsule 100 mg  100 mg Oral TID Alma Friendly, MD   100 mg at 10/26/19 0907  . gatifloxacin (ZYMAXID) 0.5 % ophthalmic drops 1 drop  1 drop Left Eye QID Karmen Bongo, MD   1 drop at 10/26/19 0911  . heparin injection 5,000 Units  5,000 Units Subcutaneous Lynne Logan, MD   5,000 Units at 10/26/19 0540  . hydrALAZINE (APRESOLINE) tablet 25 mg  25 mg Oral TID Alma Friendly, MD   25 mg at 10/26/19 0907  . insulin aspart (novoLOG) injection 0-9 Units  0-9 Units Subcutaneous TID WC Karmen Bongo, MD   2 Units at 10/26/19 [phone]  . latanoprost (XALATAN) 0.005 % ophthalmic solution 1 drop  1 drop Left Eye Ivery Quale, MD   1 drop at 10/25/19 2137  . metolazone (ZAROXOLYN) tablet 5 mg  5 mg Oral BID Corliss Parish, MD   5 mg at 10/26/19 0909  . senna-docusate (Senokot-S) tablet 1 tablet  1 tablet Oral BID Alma Friendly, MD   1 tablet at 10/26/19 0907  . sodium bicarbonate tablet 650 mg  650 mg Oral Daily Claudia Desanctis, MD   650 mg at 10/26/19 0906    Pt meds include: Statin :No Betablocker: Yes ASA: Yes Other anticoagulants/antiplatelets: none  Past Medical History:  Diagnosis Date  . Abdominal pain 12/28/2012  . Acute kidney injury superimposed on chronic kidney disease ([place])   . Blindness 09/04/2019  . Chest pain 02/14/2014  . Diabetes mellitus   . Diarrhea 10/14/2019  . Dyspnea   . Epigastric abdominal pain   . Gastroparesis due to DM ([place])   . Hypertension   . Hypoalbuminemia 04/17/2019  . Hypoxia   . Nausea   . Neuropathy of lower extremity   . Oral candidiasis 06/26/2012  . Pancreatitis 06/26/2012  . Weight loss 06/26/2012  . Wheezing     Past Surgical History:  Procedure Laterality Date  . EYE SURGERY      Social History Social History   Tobacco Use  . Smoking status: Former Smoker     Packs/day: 0.00    Types: Cigarettes    Quit date: 03/22/2015    Years since quitting: 4.6  . Smokeless tobacco: Never Used  Substance Use Topics  . Alcohol use: Not Currently  . Drug use: No    Family History Family History  Problem Relation Age of Onset  . Diabetes Mother   . Lung disease Mother   . Hypertension Mother   . Diabetes Maternal Aunt   . CAD Maternal Aunt   . CAD Cousin     Allergies  Allergen Reactions  . Morphine And Related Itching and Other (See Comments)    Pt prefers not to be given this drug     REVIEW OF SYSTEMS  General: [ ]  Weight loss, [ ]  Fever, [ ]  chills Neurologic: [ ]  Dizziness, [ ]  Blackouts, [ ]  Seizure [ ]  Stroke, [ ]  "Mini stroke", [ ]  Slurred speech, [ ]  Temporary blindness; [ ]  weakness in arms or legs, [ ]  Hoarseness [ ]  Dysphagia [x]  blind [x]  hearing loss Cardiac: [ ]  Chest pain/pressure, [ ]  Shortness of breath at rest [ ]  Shortness of breath with exertion, [ ]  Atrial fibrillation or irregular heartbeat  Vascular: [ ]  Pain in legs with walking, [ ]  Pain in legs at rest, [ ]  Pain in legs at night,  [ ]  Non-healing ulcer, [ ]  Blood clot in vein/DVT,   Pulmonary: [ ]  Home oxygen, [ ]  Productive cough, [ ]  Coughing up blood, [ ]  Asthma,  [ ]  Wheezing [ ]  COPD Musculoskeletal:  [ ]  Arthritis, [ ]  Low back pain, [ ]  Joint pain Hematologic: [ ]  Easy Bruising, x[ ]  Anemia; [ ]  Hepatitis Gastrointestinal: [ ]  Blood in stool, [ ]  Gastroesophageal Reflux/heartburn, Urinary: [x ] chronic Kidney disease, [ ]  on HD - [ ]  MWF or [ ]  TTHS, [ ]  Burning with urination, [ ]  Difficulty urinating Skin: [ ]  Rashes, [ ]  Wounds Psychological: [ ]  Anxiety, [ ]  Depression  Physical Examination Vitals:   10/25/19 2100 10/26/19 0010 10/26/19 0515 10/26/19 0908  BP: 104/70  98/65   Pulse: 61  (!) 55 (!) 59  Resp: 19  19   Temp: 97.6 F (36.4 C)     TempSrc: Oral     SpO2: 99%  90%   Weight:  79.8 kg    Height:       Body mass index is 31.18  kg/m.  General:  WDWN in NAD HENT: WNL Eyes: Pupils equal Pulmonary: normal non-labored breathing ,  without Rales, rhonchi,  wheezing Cardiac: RRR, without  Murmurs, rubs or gallops; No carotid bruits Abdomen: soft, NT, no masses Skin: no rashes, ulcers noted;  no Gangrene , no cellulitis; no open wounds;   Vascular Exam/Pulses:palpable B radial and brachial pulses   Musculoskeletal: no muscle wasting or atrophy; B LE and UE edema  Neurologic: A&O X 3; Appropriate Affect ;  SENSATION: normal; MOTOR FUNCTION: 5/5 Symmetric Speech is fluent/normal   Significant Diagnostic Studies: CBC Lab Results  Component Value Date   WBC 5.2 10/26/2019   HGB 8.4 (L) 10/26/2019   HCT 26.9 (L) 10/26/2019   MCV 91.5 10/26/2019   PLT 215 10/26/2019    BMET    Component Value Date/Time   NA 131 (L) 10/26/2019 0611   K 3.8 10/26/2019 0611   CL 94 (L) 10/26/2019 0611   CO2 20 (L) 10/26/2019 0611   GLUCOSE 209 (H) 10/26/2019 0611   BUN 101 (H) 10/26/2019 0611   CREATININE 5.84 (H) 10/26/2019 0611   CALCIUM 8.6 (L) 10/26/2019 0611   GFRNONAA 11 (L) 10/26/2019 0611   GFRAA 12 (L) 10/26/2019 0932   Estimated Creatinine Clearance: 14.6 mL/min (A) (by C-G formula based on SCr of 5.84 mg/dL (H)).  COAG Lab Results  Component Value Date   INR 1.1 10/13/2019   INR 1.0 06/16/2019   INR 1.0 02/18/2019     Non-Invasive Vascular Imaging:  Pending vein mapping  ASSESSMENT/PLAN:  Plan for [facility] and access placement pending vein mapping.  He has a visible right forearm vein at bedside.  He is left hand dominant with IV access in B UE currently.  No chest implants.    OR plan per Dr. Trula Slade.   Roxy Horseman 10/26/2019 9:36 AM +-----------------+-------------+----------+-----------+  Right Cephalic  Diameter (cm)Depth (cm) Findings   +-----------------+-------------+----------+-----------+  Shoulder       2.82     0.32           +-----------------+-------------+----------+-----------+  Mid upper arm    0.27     0.16          +-----------------+-------------+----------+-----------+  Dist upper arm    0.28                +-----------------+-------------+----------+-----------+  Antecubital fossa  0.33     0.28   IV site   +-----------------+-------------+----------+-----------+  Prox forearm               IV bandages  +-----------------+-------------+----------+-----------+  Mid forearm     0.31     0.22          +-----------------+-------------+----------+-----------+  Dist forearm     0.30                +-----------------+-------------+----------+-----------+   +-----------------+-------------+----------+---------+  Right Basilic  Diameter (cm)Depth (cm)Findings   +-----------------+-------------+----------+---------+  Mid upper arm    0.49     0.48         +-----------------+-------------+----------+---------+  Dist upper arm    0.30     0.53  branching  +-----------------+-------------+----------+---------+  Antecubital fossa  0.28     0.34  branching  +-----------------+-------------+----------+---------+   +-----------------+-------------+----------+-----------+  Left Cephalic  Diameter (cm)Depth (cm) Findings   +-----------------+-------------+----------+-----------+  Shoulder       0.31     0.73          +-----------------+-------------+----------+-----------+  Mid upper arm    0.27     0.64          +-----------------+-------------+----------+-----------+  Dist upper arm  0.31     0.41          +-----------------+-------------+----------+-----------+  Antecubital fossa  0.31     0.23          +-----------------+-------------+----------+-----------+  Prox forearm      0.31                +-----------------+-------------+----------+-----------+  Mid forearm     0.32     0.62          +-----------------+-------------+----------+-----------+  Wrist                  IV bandages  +-----------------+-------------+----------+-----------+   +-----------------+-------------+----------+---------+  Left Basilic   Diameter (cm)Depth (cm)Findings   +-----------------+-------------+----------+---------+  Mid upper arm    0.40     1.21         +-----------------+-------------+----------+---------+  Dist upper arm    0.39     1.60  branching  +-----------------+-------------+----------+---------+  Antecubital fossa  0.45     1.18  branching  +-----------------+-------------+----------+---------+  I agree with the above.  OR schedule is crowded this week.  We will plan for right arm fistula and TDC on Friday.  Annamarie Major

## 2019-10-26 NOTE — Progress Notes (Signed)
Inpatient Diabetes Program Recommendations  AACE/ADA: New Consensus Statement on Inpatient Glycemic Control   Target Ranges:  Prepandial:   less than 140 mg/dL      Peak postprandial:   less than 180 mg/dL (1-2 hours)      Critically ill patients:  140 - 180 mg/dL   Results for Christopher Davila, Christopher Davila (MRN [id number]) as of 10/26/2019 11:10  Ref. Range 10/25/2019 06:11 10/25/2019 11:34 10/25/2019 15:57 10/25/2019 21:00 10/26/2019 05:58  Glucose-Capillary Latest Ref Range: 70 - 99 mg/dL 147 (H) 193 (H) 159 (H) 253 (H) 198 (H)   Review of Glycemic Control  Current orders for Inpatient glycemic control: Novolog 0-9 units TID with meals  Inpatient Diabetes Program Recommendations:    Insulin-Correction: Please consider ordering Novolog 0-5 units QHS for bedtime correction.  Insulin-Meal Coverage: Please consider ordering Novolog 2 units TID with meals for meal coverage if patient eats at least 50% of meals.  Thanks, Barnie Alderman, RN, MSN, CDE Diabetes Coordinator Inpatient Diabetes Program [phone] (Team Pager from 8am to 5pm)

## 2019-10-26 NOTE — Progress Notes (Signed)
OT Cancellation Note  Patient Details Name: Christopher Davila MRN: [id number] DOB: [birth year]   Cancelled Treatment:    Reason Eval/Treat Not Completed: Patient declined, no reason specified. Attempted to see pt several times this date for OT, however pt declined stating "I'll do it tomorrow." Despite education regarding importance of daily activity as well as encouragement, pt continued to refuse to participate. OT will continue to follow acutely.   Mauri Brooklyn 10/26/2019, 1:56 PM

## 2019-10-26 NOTE — H&P (View-Only) (Signed)
[name] NOTE   MRN : [id number]  Reason for Consult: ESRD Referring Physician: Moshe Cipro   History of Present Illness: [name] is a 49 year old gentleman with a history of diabetes, hypertension, CKD stage IV who presented to the hospital with diarrhea. Note that he had a recent admission with acute on chronic diastolic CHF.  Now AKI on CKD.  He is legally blind and has hearing difficulties.  We have been consulted to provide [facility] and access.  Vein mapping has been ordered.     Current Facility-Administered Medications  Medication Dose Route Frequency Provider Last Rate Last Admin  . 0.9 %  sodium chloride infusion   Intravenous PRN Alma Friendly, MD 10 mL/hr at 10/16/19 0909 250 mL at 10/16/19 0909  . acetaminophen (TYLENOL) tablet 650 mg  650 mg Oral Q6H PRN Karmen Bongo, MD   650 mg at 10/14/19 2121   Or  . acetaminophen (TYLENOL) suppository 650 mg  650 mg Rectal Q6H PRN Karmen Bongo, MD      . aspirin EC tablet 81 mg  81 mg Oral Daily Karmen Bongo, MD   81 mg at 10/26/19 0907  . bisacodyl (DULCOLAX) suppository 10 mg  10 mg Rectal Once Alma Friendly, MD      . brimonidine (ALPHAGAN) 0.15 % ophthalmic solution 1 drop  1 drop Left Eye BID Karmen Bongo, MD   1 drop at 10/26/19 0910  . calcium acetate (PHOSLO) capsule 1,334 mg  1,334 mg Oral TID WC Corliss Parish, MD   1,334 mg at 10/26/19 0539  . carvedilol (COREG) tablet 25 mg  25 mg Oral BID WC Alma Friendly, MD   25 mg at 10/26/19 0908  . Chlorhexidine Gluconate Cloth 2 % PADS 6 each  6 each Topical Daily Karmen Bongo, MD   6 each at 10/25/19 [phone]  . Darbepoetin Alfa (ARANESP) injection 100 mcg  100 mcg Subcutaneous Q Mon-1800 Corliss Parish, MD   100 mcg at 10/25/19 1632  . dorzolamide-timolol (COSOPT) 22.3-6.8 MG/ML ophthalmic solution 1 drop  1 drop Left Eye BID Karmen Bongo, MD   1 drop at 10/26/19 0910  . furosemide (LASIX) 160 mg  in dextrose 5 % 50 mL IVPB  160 mg Intravenous TID Donato Heinz, MD 62 mL/hr at 10/26/19 0918 160 mg at 10/26/19 0918  . gabapentin (NEURONTIN) capsule 100 mg  100 mg Oral TID Alma Friendly, MD   100 mg at 10/26/19 0907  . gatifloxacin (ZYMAXID) 0.5 % ophthalmic drops 1 drop  1 drop Left Eye QID Karmen Bongo, MD   1 drop at 10/26/19 0911  . heparin injection 5,000 Units  5,000 Units Subcutaneous Lynne Logan, MD   5,000 Units at 10/26/19 0540  . hydrALAZINE (APRESOLINE) tablet 25 mg  25 mg Oral TID Alma Friendly, MD   25 mg at 10/26/19 0907  . insulin aspart (novoLOG) injection 0-9 Units  0-9 Units Subcutaneous TID WC Karmen Bongo, MD   2 Units at 10/26/19 [phone]  . latanoprost (XALATAN) 0.005 % ophthalmic solution 1 drop  1 drop Left Eye Ivery Quale, MD   1 drop at 10/25/19 2137  . metolazone (ZAROXOLYN) tablet 5 mg  5 mg Oral BID Corliss Parish, MD   5 mg at 10/26/19 0909  . senna-docusate (Senokot-S) tablet 1 tablet  1 tablet Oral BID Alma Friendly, MD   1 tablet at 10/26/19 0907  . sodium bicarbonate tablet 650 mg  650 mg Oral Daily Claudia Desanctis, MD   650 mg at 10/26/19 0906    Pt meds include: Statin :No Betablocker: Yes ASA: Yes Other anticoagulants/antiplatelets: none  Past Medical History:  Diagnosis Date  . Abdominal pain 12/28/2012  . Acute kidney injury superimposed on chronic kidney disease ([place])   . Blindness 09/04/2019  . Chest pain 02/14/2014  . Diabetes mellitus   . Diarrhea 10/14/2019  . Dyspnea   . Epigastric abdominal pain   . Gastroparesis due to DM ([place])   . Hypertension   . Hypoalbuminemia 04/17/2019  . Hypoxia   . Nausea   . Neuropathy of lower extremity   . Oral candidiasis 06/26/2012  . Pancreatitis 06/26/2012  . Weight loss 06/26/2012  . Wheezing     Past Surgical History:  Procedure Laterality Date  . EYE SURGERY      Social History Social History   Tobacco Use  . Smoking status: Former Smoker     Packs/day: 0.00    Types: Cigarettes    Quit date: 03/22/2015    Years since quitting: 4.6  . Smokeless tobacco: Never Used  Substance Use Topics  . Alcohol use: Not Currently  . Drug use: No    Family History Family History  Problem Relation Age of Onset  . Diabetes Mother   . Lung disease Mother   . Hypertension Mother   . Diabetes Maternal Aunt   . CAD Maternal Aunt   . CAD Cousin     Allergies  Allergen Reactions  . Morphine And Related Itching and Other (See Comments)    Pt prefers not to be given this drug     REVIEW OF SYSTEMS  General: [ ]  Weight loss, [ ]  Fever, [ ]  chills Neurologic: [ ]  Dizziness, [ ]  Blackouts, [ ]  Seizure [ ]  Stroke, [ ]  "Mini stroke", [ ]  Slurred speech, [ ]  Temporary blindness; [ ]  weakness in arms or legs, [ ]  Hoarseness [ ]  Dysphagia [x]  blind [x]  hearing loss Cardiac: [ ]  Chest pain/pressure, [ ]  Shortness of breath at rest [ ]  Shortness of breath with exertion, [ ]  Atrial fibrillation or irregular heartbeat  Vascular: [ ]  Pain in legs with walking, [ ]  Pain in legs at rest, [ ]  Pain in legs at night,  [ ]  Non-healing ulcer, [ ]  Blood clot in vein/DVT,   Pulmonary: [ ]  Home oxygen, [ ]  Productive cough, [ ]  Coughing up blood, [ ]  Asthma,  [ ]  Wheezing [ ]  COPD Musculoskeletal:  [ ]  Arthritis, [ ]  Low back pain, [ ]  Joint pain Hematologic: [ ]  Easy Bruising, x[ ]  Anemia; [ ]  Hepatitis Gastrointestinal: [ ]  Blood in stool, [ ]  Gastroesophageal Reflux/heartburn, Urinary: [x ] chronic Kidney disease, [ ]  on HD - [ ]  MWF or [ ]  TTHS, [ ]  Burning with urination, [ ]  Difficulty urinating Skin: [ ]  Rashes, [ ]  Wounds Psychological: [ ]  Anxiety, [ ]  Depression  Physical Examination Vitals:   10/25/19 2100 10/26/19 0010 10/26/19 0515 10/26/19 0908  BP: 104/70  98/65   Pulse: 61  (!) 55 (!) 59  Resp: 19  19   Temp: 97.6 F (36.4 C)     TempSrc: Oral     SpO2: 99%  90%   Weight:  79.8 kg    Height:       Body mass index is 31.18  kg/m.  General:  WDWN in NAD HENT: WNL Eyes: Pupils equal Pulmonary: normal non-labored breathing ,  without Rales, rhonchi,  wheezing Cardiac: RRR, without  Murmurs, rubs or gallops; No carotid bruits Abdomen: soft, NT, no masses Skin: no rashes, ulcers noted;  no Gangrene , no cellulitis; no open wounds;   Vascular Exam/Pulses:palpable B radial and brachial pulses   Musculoskeletal: no muscle wasting or atrophy; B LE and UE edema  Neurologic: A&O X 3; Appropriate Affect ;  SENSATION: normal; MOTOR FUNCTION: 5/5 Symmetric Speech is fluent/normal   Significant Diagnostic Studies: CBC Lab Results  Component Value Date   WBC 5.2 10/26/2019   HGB 8.4 (L) 10/26/2019   HCT 26.9 (L) 10/26/2019   MCV 91.5 10/26/2019   PLT 215 10/26/2019    BMET    Component Value Date/Time   NA 131 (L) 10/26/2019 0611   K 3.8 10/26/2019 0611   CL 94 (L) 10/26/2019 0611   CO2 20 (L) 10/26/2019 0611   GLUCOSE 209 (H) 10/26/2019 0611   BUN 101 (H) 10/26/2019 0611   CREATININE 5.84 (H) 10/26/2019 0611   CALCIUM 8.6 (L) 10/26/2019 0611   GFRNONAA 11 (L) 10/26/2019 0611   GFRAA 12 (L) 10/26/2019 2330   Estimated Creatinine Clearance: 14.6 mL/min (A) (by C-G formula based on SCr of 5.84 mg/dL (H)).  COAG Lab Results  Component Value Date   INR 1.1 10/13/2019   INR 1.0 06/16/2019   INR 1.0 02/18/2019     Non-Invasive Vascular Imaging:  Pending vein mapping  ASSESSMENT/PLAN:  Plan for [facility] and access placement pending vein mapping.  He has a visible right forearm vein at bedside.  He is left hand dominant with IV access in B UE currently.  No chest implants.    OR plan per Dr. Trula Slade.   Roxy Horseman 10/26/2019 9:36 AM +-----------------+-------------+----------+-----------+  Right Cephalic  Diameter (cm)Depth (cm) Findings   +-----------------+-------------+----------+-----------+  Shoulder       2.82     0.32           +-----------------+-------------+----------+-----------+  Mid upper arm    0.27     0.16          +-----------------+-------------+----------+-----------+  Dist upper arm    0.28                +-----------------+-------------+----------+-----------+  Antecubital fossa  0.33     0.28   IV site   +-----------------+-------------+----------+-----------+  Prox forearm               IV bandages  +-----------------+-------------+----------+-----------+  Mid forearm     0.31     0.22          +-----------------+-------------+----------+-----------+  Dist forearm     0.30                +-----------------+-------------+----------+-----------+   +-----------------+-------------+----------+---------+  Right Basilic  Diameter (cm)Depth (cm)Findings   +-----------------+-------------+----------+---------+  Mid upper arm    0.49     0.48         +-----------------+-------------+----------+---------+  Dist upper arm    0.30     0.53  branching  +-----------------+-------------+----------+---------+  Antecubital fossa  0.28     0.34  branching  +-----------------+-------------+----------+---------+   +-----------------+-------------+----------+-----------+  Left Cephalic  Diameter (cm)Depth (cm) Findings   +-----------------+-------------+----------+-----------+  Shoulder       0.31     0.73          +-----------------+-------------+----------+-----------+  Mid upper arm    0.27     0.64          +-----------------+-------------+----------+-----------+  Dist upper arm  0.31     0.41          +-----------------+-------------+----------+-----------+  Antecubital fossa  0.31     0.23          +-----------------+-------------+----------+-----------+  Prox forearm      0.31                +-----------------+-------------+----------+-----------+  Mid forearm     0.32     0.62          +-----------------+-------------+----------+-----------+  Wrist                  IV bandages  +-----------------+-------------+----------+-----------+   +-----------------+-------------+----------+---------+  Left Basilic   Diameter (cm)Depth (cm)Findings   +-----------------+-------------+----------+---------+  Mid upper arm    0.40     1.21         +-----------------+-------------+----------+---------+  Dist upper arm    0.39     1.60  branching  +-----------------+-------------+----------+---------+  Antecubital fossa  0.45     1.18  branching  +-----------------+-------------+----------+---------+  I agree with the above.  OR schedule is crowded this week.  We will plan for right arm fistula and TDC on Friday.  Annamarie Major

## 2019-10-27 ENCOUNTER — Inpatient Hospital Stay (HOSPITAL_COMMUNITY): Payer: Medicaid Other

## 2019-10-27 DIAGNOSIS — I5033 Acute on chronic diastolic (congestive) heart failure: Secondary | ICD-10-CM

## 2019-10-27 DIAGNOSIS — T68XXXA Hypothermia, initial encounter: Secondary | ICD-10-CM

## 2019-10-27 DIAGNOSIS — Z794 Long term (current) use of insulin: Secondary | ICD-10-CM

## 2019-10-27 DIAGNOSIS — N189 Chronic kidney disease, unspecified: Secondary | ICD-10-CM

## 2019-10-27 DIAGNOSIS — E8770 Fluid overload, unspecified: Secondary | ICD-10-CM

## 2019-10-27 DIAGNOSIS — N19 Unspecified kidney failure: Secondary | ICD-10-CM

## 2019-10-27 DIAGNOSIS — N179 Acute kidney failure, unspecified: Secondary | ICD-10-CM

## 2019-10-27 DIAGNOSIS — R5381 Other malaise: Secondary | ICD-10-CM

## 2019-10-27 DIAGNOSIS — G9341 Metabolic encephalopathy: Secondary | ICD-10-CM

## 2019-10-27 DIAGNOSIS — E1165 Type 2 diabetes mellitus with hyperglycemia: Secondary | ICD-10-CM

## 2019-10-27 LAB — GLUCOSE, CAPILLARY
Glucose-Capillary: 175 mg/dL — ABNORMAL HIGH (ref 70–99)
Glucose-Capillary: 173 mg/dL — ABNORMAL HIGH (ref 70–99)
Glucose-Capillary: 210 mg/dL — ABNORMAL HIGH (ref 70–99)
Glucose-Capillary: 171 mg/dL — ABNORMAL HIGH (ref 70–99)
Glucose-Capillary: 123 mg/dL — ABNORMAL HIGH (ref 70–99)

## 2019-10-27 LAB — CBC WITH DIFFERENTIAL/PLATELET
Abs Immature Granulocytes: 0.13 10*3/uL — ABNORMAL HIGH (ref 0.00–0.07)
Basophils Absolute: 0 10*3/uL (ref 0.0–0.1)
Basophils Relative: 0 %
Eosinophils Absolute: 0.1 10*3/uL (ref 0.0–0.5)
Eosinophils Relative: 1 %
HCT: 27.1 % — ABNORMAL LOW (ref 39.0–52.0)
Hemoglobin: 8.5 g/dL — ABNORMAL LOW (ref 13.0–17.0)
Immature Granulocytes: 1 %
Lymphocytes Relative: 4 %
Lymphs Abs: 0.4 10*3/uL — ABNORMAL LOW (ref 0.7–4.0)
MCH: 28.4 pg (ref 26.0–34.0)
MCHC: 31.4 g/dL (ref 30.0–36.0)
MCV: 90.6 fL (ref 80.0–100.0)
Monocytes Absolute: 1.2 10*3/uL — ABNORMAL HIGH (ref 0.1–1.0)
Monocytes Relative: 12 %
Neutro Abs: 8.5 10*3/uL — ABNORMAL HIGH (ref 1.7–7.7)
Neutrophils Relative %: 82 %
Platelets: 213 10*3/uL (ref 150–400)
RBC: 2.99 MIL/uL — ABNORMAL LOW (ref 4.22–5.81)
RDW: 16 % — ABNORMAL HIGH (ref 11.5–15.5)
WBC: 10.3 10*3/uL (ref 4.0–10.5)
nRBC: 0.2 % (ref 0.0–0.2)

## 2019-10-27 LAB — URINALYSIS, ROUTINE W REFLEX MICROSCOPIC
Bilirubin Urine: NEGATIVE
Glucose, UA: 50 mg/dL — AB
Ketones, ur: NEGATIVE mg/dL
Nitrite: NEGATIVE
Protein, ur: 100 mg/dL — AB
RBC / HPF: >50 RBC/hpf — ABNORMAL HIGH (ref 0–5)
Specific Gravity, Urine: 1.014 (ref 1.005–1.030)
WBC, UA: >50 WBC/hpf — ABNORMAL HIGH (ref 0–5)
pH: 5 (ref 5.0–8.0)

## 2019-10-27 LAB — RENAL FUNCTION PANEL
Albumin: 2.6 g/dL — ABNORMAL LOW (ref 3.5–5.0)
Anion gap: 16 — ABNORMAL HIGH (ref 5–15)
BUN: 105 mg/dL — ABNORMAL HIGH (ref 6–20)
CO2: 20 mmol/L — ABNORMAL LOW (ref 22–32)
Calcium: 8.9 mg/dL (ref 8.9–10.3)
Chloride: 94 mmol/L — ABNORMAL LOW (ref 98–111)
Creatinine, Ser: 6.49 mg/dL — ABNORMAL HIGH (ref 0.61–1.24)
GFR calc Af Amer: 11 mL/min — ABNORMAL LOW (ref >60)
GFR calc non Af Amer: 9 mL/min — ABNORMAL LOW (ref >60)
Glucose, Bld: 184 mg/dL — ABNORMAL HIGH (ref 70–99)
Phosphorus: 9.1 mg/dL — ABNORMAL HIGH (ref 2.5–4.6)
Potassium: 3.9 mmol/L (ref 3.5–5.1)
Sodium: 130 mmol/L — ABNORMAL LOW (ref 135–145)

## 2019-10-27 LAB — BRAIN NATRIURETIC PEPTIDE: B Natriuretic Peptide: 913.6 pg/mL — ABNORMAL HIGH (ref 0.0–100.0)

## 2019-10-27 LAB — PARATHYROID HORMONE, INTACT (NO CA): PTH: 172 pg/mL — ABNORMAL HIGH (ref 15–65)

## 2019-10-27 MED ORDER — CEFAZOLIN SODIUM-DEXTROSE 2-4 GM/100ML-% IV SOLN: 2.0000 g | INTRAVENOUS | Status: DC

## 2019-10-27 NOTE — Plan of Care (Signed)
Decreased Problem: Elimination: Goal: Will not experience complications related to bowel motility Outcome: Not Progressing   output

## 2019-10-27 NOTE — Progress Notes (Signed)
PROGRESS NOTE  Christopher Davila ZWC:[id number] DOB: Sep 10, 1970   PCP: Marliss Coots, NP  Patient is from: Home  DOA: 10/13/2019 LOS: 77  Brief Narrative / Interim history: 49 year old male with history of hypertension, diabetes mellitus/gastroparesis/blindness, CKD stage IV, chronic diastolic CHF admitted with diarrhea, weakness and failure to thrive. He was  recently admitted 4/10-12 for acute on chronic diastolic CHF.  He returned to the ER on 4/20 but did not qualify for hospitalization at that time and he has been not able to be placed in a SNF. In the ED had severe acidosis with a gap, given bicarb IV fluids and was admitted. Diarrhea has since resolved C. difficile negative GI panel negative.  Patient developed progressive fluid overload with lower leg edema acute on chronic diastolic CHF. Was started on lasix drip and metolazone but remained fluid overloaded with oliguria and uremic symptoms.  Plan to initiate dialysis.  IR consulted by nephrology for [facility].  [facility] consulted for permanent access.  Subjective: Seen and examined earlier this morning.  Being fed by NT at bedside.  Reports diminished hearing, bilateral lower extremity pain and abdominal pain. He is somewhat slow mentally.  Became hypothermic to 92.5 with worsening mental status later this morning.  Objective: Vitals:   10/27/19 0350 10/27/19 0954 10/27/19 1043 10/27/19 1043  BP:  90/60 100/65 100/65  Pulse:  (!) 56 (!) 57 (!) 57  Resp:  (!) 22  20  Temp:   (!) 92.3 F (33.5 C)   TempSrc:      SpO2:  98% 92%   Weight: 81.2 kg     Height:        Intake/Output Summary (Last 24 hours) at 10/27/2019 1249 Last data filed at 10/27/2019 0942 Gross per 24 hour  Intake 1080 ml  Output 276 ml  Net 804 ml   Filed Weights   10/25/19 0353 10/26/19 0010 10/27/19 0350  Weight: 79.4 kg 79.8 kg 81.2 kg    Examination:  GENERAL: No apparent distress.  Nontoxic.  Somewhat sleepy. HEENT: MMM.  Legally blind.  Hearing grossly  intact. NECK: Supple.  No apparent JVD.  RESP: 92% on 2 L.  No IWOB.  Fair aeration bilaterally. CVS:  RRR. Heart sounds normal.  ABD/GI/GU: BS+.  Abdomen somewhat distended.  Indwelling Foley. MSK/EXT:  Moves extremities.  2+ pitting edema bilaterally. SKIN: no apparent skin lesion or wound NEURO: He was oriented to self, place and situation earlier this morning but not responding to my questions later when he became hypothermic.  No apparent focal neuro deficit. PSYCH: Somewhat sleepy.  No distress or agitation.  Procedures:  None  Microbiology summarized: 4/28-influenza PCR and COVID-19 PCR negative. 4/28-C. difficile negative. 4/28-GI panel negative. 5/12-blood and urine cultures ordered.  Assessment & Plan: Acute on chronic diastolic CHF/fluid overload with worsening renal function and oliguria: No response to high-dose diuretics.  -Plan for HD per nephrology.  IR consulted for [facility]  Acute metabolic encephalopathy: Uremia?  BUN 105.  Also hypothermic but no fever or leukocytosis although WBC doubled from prior.  Abdomen is slightly distended.  TSH and cortisol previously normal.  No apparent focal neuro deficits. -Obtain cultures and CT abdomen and pelvis -Check ammonia and CT head -Uremia per nephrology. -Hold low-dose gabapentin  Hypothermia: rectal temp 92.3 this morning but resolved with bear hugger. -Work-up as above.  AKI on CKD-4 with uremia and anion gap metabolic acidosis -Per nephrology  Hypervolemic hyponatremia: Likely due to renal failure and fluid overload -HD per  nephrology  Acute Hypoxic Respiratory Failure:  Likely due to fluid overload and CHF -Treatment as above  Diarrhea: Resolved.  Acute urine retention: Indwelling Foley catheter placed here. -Continue indwelling Foley for now  Controlled DM-2: A1c 6.4 09/19/2019 Recent Labs    10/27/19 0635 10/27/19 0731 10/27/19 1110  GLUCAP 175* 173* 210*  -Continue SSI -Add Lantus 5 units in the  morning  Essential hypertension: Normotensive but soft. -Hold morning meds  Hyperlipidemia: continue statin.  Anemia of chronic renal disease : H&H stable. -Continue monitoring  Generalized weakness/Debility:  -PT/OT eval  Goal of care: Significant comorbidities as above.  Legally blind.  Full code. -Involve palliative care          DVT prophylaxis: Subcu heparin Code Status: Full code Family Communication: Updated [name], patient's cousin listed as patient's contact. Status is: Inpatient  Remains inpatient appropriate because: Fluid overload/CHF exacerbation/AKI on CKD-4/metabolic encephalopathy Plan for HD and further evaluation for encephalopathy  Dispo: The patient is from: Home              Anticipated d/c is to: To be determined              Anticipated d/c date is: > 3 days              Patient currently is not medically stable to d/c.        Consultants:  Nephrology IR [facility] PMT   Sch Meds:  Scheduled Meds: . aspirin EC  81 mg Oral Daily  . bisacodyl  10 mg Rectal Once  . brimonidine  1 drop Left Eye BID  . calcium acetate  1,334 mg Oral TID WC  . Chlorhexidine Gluconate Cloth  6 each Topical Daily  . darbepoetin (ARANESP) injection - NON-DIALYSIS  100 mcg Subcutaneous Q Mon-1800  . dorzolamide-timolol  1 drop Left Eye BID  . gatifloxacin  1 drop Left Eye QID  . heparin  5,000 Units Subcutaneous Q8H  . hydrALAZINE  25 mg Oral TID  . insulin aspart  0-9 Units Subcutaneous TID WC  . latanoprost  1 drop Left Eye QHS  . senna-docusate  1 tablet Oral BID  . sodium bicarbonate  650 mg Oral Daily   Continuous Infusions: . sodium chloride 250 mL (10/16/19 0909)   PRN Meds:.sodium chloride, acetaminophen **OR** acetaminophen  Antimicrobials: Anti-infectives (From admission, onward)   None       I have personally reviewed the following labs and images: CBC: Recent Labs  Lab 10/23/19 0858 10/24/19 0419 10/25/19 0532 10/26/19 0611  10/27/19 0719  WBC 7.4 6.4 6.6 5.2 10.3  NEUTROABS 5.4 4.5 4.7 3.6 8.5*  HGB 8.1* 8.1* 8.3* 8.4* 8.5*  HCT 26.2* 26.2* 26.9* 26.9* 27.1*  MCV 91.0 91.0 92.1 91.5 90.6  PLT 195 190 206 215 213   BMP &GFR Recent Labs  Lab 10/23/19 0858 10/24/19 0419 10/25/19 0751 10/26/19 0611 10/27/19 0719  NA 132* 133* 133* 131* 130*  K 3.5 3.7 3.9 3.8 3.9  CL 96* 98 96* 94* 94*  CO2 23 22 21* 20* 20*  GLUCOSE 194* 155* 148* 209* 184*  BUN 78* 84* 98* 101* 105*  CREATININE 4.12* 4.73* 5.26* 5.84* 6.49*  CALCIUM 8.6* 8.6* 8.7* 8.6* 8.9  PHOS 7.4* 8.0* 9.0* 9.5* 9.1*   Estimated Creatinine Clearance: 13.3 mL/min (A) (by C-G formula based on SCr of 6.49 mg/dL (H)). Liver & Pancreas: Recent Labs  Lab 10/23/19 1884 10/24/19 0419 10/25/19 0751 10/26/19 0611 10/27/19 0719  AST  --   --  52*  --   --   ALT  --   --  135*  --   --   ALKPHOS  --   --  241*  --   --   BILITOT  --   --  1.1  --   --   PROT  --   --  6.4*  --   --   ALBUMIN 2.4* 2.4* 2.5* 2.4* 2.6*   No results for input(s): LIPASE, AMYLASE in the last 168 hours. No results for input(s): AMMONIA in the last 168 hours. Diabetic: No results for input(s): HGBA1C in the last 72 hours. Recent Labs  Lab 10/26/19 1614 10/26/19 2109 10/27/19 0635 10/27/19 0731 10/27/19 1110  GLUCAP 195* 236* 175* 173* 210*   Cardiac Enzymes: No results for input(s): CKTOTAL, CKMB, CKMBINDEX, TROPONINI in the last 168 hours. No results for input(s): PROBNP in the last 8760 hours. Coagulation Profile: No results for input(s): INR, PROTIME in the last 168 hours. Thyroid Function Tests: No results for input(s): TSH, T4TOTAL, FREET4, T3FREE, THYROIDAB in the last 72 hours. Lipid Profile: No results for input(s): CHOL, HDL, LDLCALC, TRIG, CHOLHDL, LDLDIRECT in the last 72 hours. Anemia Panel: No results for input(s): VITAMINB12, FOLATE, FERRITIN, TIBC, IRON, RETICCTPCT in the last 72 hours. Urine analysis:    Component Value Date/Time    COLORURINE YELLOW 10/13/2019 1500   APPEARANCEUR CLOUDY (A) 10/13/2019 1500   LABSPEC 1.015 10/13/2019 1500   PHURINE 5.0 10/13/2019 1500   GLUCOSEU 50 (A) 10/13/2019 1500   HGBUR MODERATE (A) 10/13/2019 1500   BILIRUBINUR NEGATIVE 10/13/2019 1500   KETONESUR NEGATIVE 10/13/2019 1500   PROTEINUR >=300 (A) 10/13/2019 1500   UROBILINOGEN 0.2 12/13/2014 2056   NITRITE NEGATIVE 10/13/2019 1500   LEUKOCYTESUR LARGE (A) 10/13/2019 1500   Sepsis Labs: Invalid input(s): PROCALCITONIN, Ida  Microbiology: No results found for this or any previous visit (from the past 240 hour(s)).  Radiology Studies: VAS [place] UPPER EXT VEIN MAPPING (PRE-OP AVF)  Result Date: 10/26/2019 UPPER EXTREMITY VEIN MAPPING  Indications: Pre-access. Performing Technologist: June Leap RDMS, RVT  Examination Guidelines: A complete evaluation includes B-mode imaging, spectral Doppler, color Doppler, and power Doppler as needed of all accessible portions of each vessel. Bilateral testing is considered an integral part of a complete examination. Limited examinations for reoccurring indications may be performed as noted. +-----------------+-------------+----------+-----------+ Right Cephalic   Diameter (cm)Depth (cm) Findings   +-----------------+-------------+----------+-----------+ Shoulder             2.82        0.32               +-----------------+-------------+----------+-----------+ Mid upper arm        0.27        0.16               +-----------------+-------------+----------+-----------+ Dist upper arm       0.28                           +-----------------+-------------+----------+-----------+ Antecubital fossa    0.33        0.28     IV site   +-----------------+-------------+----------+-----------+ Prox forearm                            IV bandages +-----------------+-------------+----------+-----------+ Mid forearm          0.31        0.22                +-----------------+-------------+----------+-----------+  Dist forearm         0.30                           +-----------------+-------------+----------+-----------+ +-----------------+-------------+----------+---------+ Right Basilic    Diameter (cm)Depth (cm)Findings  +-----------------+-------------+----------+---------+ Mid upper arm        0.49        0.48             +-----------------+-------------+----------+---------+ Dist upper arm       0.30        0.53   branching +-----------------+-------------+----------+---------+ Antecubital fossa    0.28        0.34   branching +-----------------+-------------+----------+---------+ +-----------------+-------------+----------+-----------+ Left Cephalic    Diameter (cm)Depth (cm) Findings   +-----------------+-------------+----------+-----------+ Shoulder             0.31        0.73               +-----------------+-------------+----------+-----------+ Mid upper arm        0.27        0.64               +-----------------+-------------+----------+-----------+ Dist upper arm       0.31        0.41               +-----------------+-------------+----------+-----------+ Antecubital fossa    0.31        0.23               +-----------------+-------------+----------+-----------+ Prox forearm         0.31                           +-----------------+-------------+----------+-----------+ Mid forearm          0.32        0.62               +-----------------+-------------+----------+-----------+ Wrist                                   IV bandages +-----------------+-------------+----------+-----------+ +-----------------+-------------+----------+---------+ Left Basilic     Diameter (cm)Depth (cm)Findings  +-----------------+-------------+----------+---------+ Mid upper arm        0.40        1.21             +-----------------+-------------+----------+---------+ Dist upper arm       0.39         1.60   branching +-----------------+-------------+----------+---------+ Antecubital fossa    0.45        1.18   branching +-----------------+-------------+----------+---------+ *See table(s) above for measurements and observations.  Diagnosing physician: Harold Barban MD Electronically signed by Harold Barban MD on 10/26/2019 at 9:46:02 PM.    Final    45 minutes with more than 50% spent in reviewing records, counseling patient/family and coordinating care.   Anayely Constantine T. Seibert  If 7PM-7AM, please contact night-coverage [email or web address] Password [facility] 10/27/2019, 12:49 PM

## 2019-10-27 NOTE — Plan of Care (Signed)
  Problem: Elimination: Goal: Will not experience complications related to bowel motility Outcome: Not Progressing   

## 2019-10-27 NOTE — Progress Notes (Signed)
Patient ID: Valli Glance, male   DOB: May 25, 1971, 49 y.o.   MRN: [id number]    S. Eyes closed.  Says that his hearing is down still today.   Very minimal UOP with max diuretics- BUN and crt worsening.  Plan now is to initiate dialysis - appreciate [facility]-  Will plan for avf on Friday.  I have asked IR to see if we can get [facility] before then.  He is hypothermic, less responsive today     O:BP 100/65 (BP Location: Left Arm)   Pulse (!) 57   Temp (!) 92.3 F (33.5 C) Comment: Rectal  Resp 20   Ht 5\' 3"  (1.6 m)   Wt 81.2 kg   SpO2 92%   BMI 31.71 kg/m   Intake/Output Summary (Last 24 hours) at 10/27/2019 1113 Last data filed at 10/27/2019 0942 Gross per 24 hour  Intake 1080 ml  Output 276 ml  Net 804 ml   Intake/Output: I/O last 3 completed shifts: In: 91 [P.O.:960] Out: 275 [Urine:275]  Intake/Output this shift:  Total I/O In: 360 [P.O.:360] Out: 101 [Urine:100; Stool:1] Weight change: 1.361 kg Gen: NAD- eyes closed-  Less responsive today  CVS: no rub Resp: occ rhonchi Abd: +BS, soft, NT/ND Ext: 2+ edema  Recent Labs  Lab 10/21/19 0417 10/22/19 0528 10/23/19 0858 10/24/19 0419 10/25/19 0751 10/26/19 0611 10/27/19 0719  NA 136 134* 132* 133* 133* 131* 130*  K 4.1 4.0 3.5 3.7 3.9 3.8 3.9  CL 102 99 96* 98 96* 94* 94*  CO2 24 23 23 22  21* 20* 20*  GLUCOSE 159* 174* 194* 155* 148* 209* 184*  BUN 66* 72* 78* 84* 98* 101* 105*  CREATININE 3.84* 4.04* 4.12* 4.73* 5.26* 5.84* 6.49*  ALBUMIN 2.3* 2.3* 2.4* 2.4* 2.5* 2.4* 2.6*  CALCIUM 8.7* 8.8* 8.6* 8.6* 8.7* 8.6* 8.9  PHOS 6.5* 6.9* 7.4* 8.0* 9.0* 9.5* 9.1*  AST  --   --   --   --  52*  --   --   ALT  --   --   --   --  135*  --   --    Liver Function Tests: Recent Labs  Lab 10/25/19 0751 10/26/19 0611 10/27/19 0719  AST 52*  --   --   ALT 135*  --   --   ALKPHOS 241*  --   --   BILITOT 1.1  --   --   PROT 6.4*  --   --   ALBUMIN 2.5* 2.4* 2.6*   No results for input(s): LIPASE, AMYLASE in the last 168  hours. No results for input(s): AMMONIA in the last 168 hours. CBC: Recent Labs  Lab 10/23/19 0858 10/23/19 0858 10/24/19 0419 10/24/19 0419 10/25/19 0532 10/26/19 0611 10/27/19 0719  WBC 7.4   < > 6.4   < > 6.6 5.2 10.3  NEUTROABS 5.4   < > 4.5   < > 4.7 3.6 8.5*  HGB 8.1*   < > 8.1*   < > 8.3* 8.4* 8.5*  HCT 26.2*   < > 26.2*   < > 26.9* 26.9* 27.1*  MCV 91.0  --  91.0  --  92.1 91.5 90.6  PLT 195   < > 190   < > 206 215 213   < > = values in this interval not displayed.   Cardiac Enzymes: No results for input(s): CKTOTAL, CKMB, CKMBINDEX, TROPONINI in the last 168 hours. CBG: Recent Labs  Lab 10/26/19 1213 10/26/19 1614 10/26/19  2109 10/27/19 0635 10/27/19 0731  GLUCAP 178* 195* 236* 175* 173*    Iron Studies: No results for input(s): IRON, TIBC, TRANSFERRIN, FERRITIN in the last 72 hours. Studies/Results: DG Chest Port 1 View  Result Date: 10/25/2019 CLINICAL DATA:  Bilateral leg swelling EXAM: PORTABLE CHEST 1 VIEW COMPARISON:  For 03/06/2020 FINDINGS: Progressive diffuse bilateral airspace disease. Progressive bilateral pleural effusions and bibasilar atelectasis. Mild cardiac enlargement. IMPRESSION: Progression of congestive heart failure with edema. Enlarging bilateral pleural effusions. Electronically Signed   By: Franchot Gallo M.D.   On: 10/25/2019 11:34   VAS [place] UPPER EXT VEIN MAPPING (PRE-OP AVF)  Result Date: 10/26/2019 UPPER EXTREMITY VEIN MAPPING  Indications: Pre-access. Performing Technologist: June Leap RDMS, RVT  Examination Guidelines: A complete evaluation includes B-mode imaging, spectral Doppler, color Doppler, and power Doppler as needed of all accessible portions of each vessel. Bilateral testing is considered an integral part of a complete examination. Limited examinations for reoccurring indications may be performed as noted. +-----------------+-------------+----------+-----------+ Right Cephalic   Diameter (cm)Depth (cm) Findings    +-----------------+-------------+----------+-----------+ Shoulder             2.82        0.32               +-----------------+-------------+----------+-----------+ Mid upper arm        0.27        0.16               +-----------------+-------------+----------+-----------+ Dist upper arm       0.28                           +-----------------+-------------+----------+-----------+ Antecubital fossa    0.33        0.28     IV site   +-----------------+-------------+----------+-----------+ Prox forearm                            IV bandages +-----------------+-------------+----------+-----------+ Mid forearm          0.31        0.22               +-----------------+-------------+----------+-----------+ Dist forearm         0.30                           +-----------------+-------------+----------+-----------+ +-----------------+-------------+----------+---------+ Right Basilic    Diameter (cm)Depth (cm)Findings  +-----------------+-------------+----------+---------+ Mid upper arm        0.49        0.48             +-----------------+-------------+----------+---------+ Dist upper arm       0.30        0.53   branching +-----------------+-------------+----------+---------+ Antecubital fossa    0.28        0.34   branching +-----------------+-------------+----------+---------+ +-----------------+-------------+----------+-----------+ Left Cephalic    Diameter (cm)Depth (cm) Findings   +-----------------+-------------+----------+-----------+ Shoulder             0.31        0.73               +-----------------+-------------+----------+-----------+ Mid upper arm        0.27        0.64               +-----------------+-------------+----------+-----------+ Dist upper arm       0.31  0.41               +-----------------+-------------+----------+-----------+ Antecubital fossa    0.31        0.23                +-----------------+-------------+----------+-----------+ Prox forearm         0.31                           +-----------------+-------------+----------+-----------+ Mid forearm          0.32        0.62               +-----------------+-------------+----------+-----------+ Wrist                                   IV bandages +-----------------+-------------+----------+-----------+ +-----------------+-------------+----------+---------+ Left Basilic     Diameter (cm)Depth (cm)Findings  +-----------------+-------------+----------+---------+ Mid upper arm        0.40        1.21             +-----------------+-------------+----------+---------+ Dist upper arm       0.39        1.60   branching +-----------------+-------------+----------+---------+ Antecubital fossa    0.45        1.18   branching +-----------------+-------------+----------+---------+ *See table(s) above for measurements and observations.  Diagnosing physician: Harold Barban MD Electronically signed by Harold Barban MD on 10/26/2019 at 9:46:02 PM.    Final    . aspirin EC  81 mg Oral Daily  . bisacodyl  10 mg Rectal Once  . brimonidine  1 drop Left Eye BID  . calcium acetate  1,334 mg Oral TID WC  . Chlorhexidine Gluconate Cloth  6 each Topical Daily  . darbepoetin (ARANESP) injection - NON-DIALYSIS  100 mcg Subcutaneous Q Mon-1800  . dorzolamide-timolol  1 drop Left Eye BID  . gabapentin  100 mg Oral TID  . gatifloxacin  1 drop Left Eye QID  . heparin  5,000 Units Subcutaneous Q8H  . hydrALAZINE  25 mg Oral TID  . insulin aspart  0-9 Units Subcutaneous TID WC  . latanoprost  1 drop Left Eye QHS  . senna-docusate  1 tablet Oral BID  . sodium bicarbonate  650 mg Oral Daily    BMET    Component Value Date/Time   NA 130 (L) 10/27/2019 0719   K 3.9 10/27/2019 0719   CL 94 (L) 10/27/2019 0719   CO2 20 (L) 10/27/2019 0719   GLUCOSE 184 (H) 10/27/2019 0719   BUN 105 (H) 10/27/2019 0719   CREATININE  6.49 (H) 10/27/2019 0719   CALCIUM 8.9 10/27/2019 0719   GFRNONAA 9 (L) 10/27/2019 0719   GFRAA 11 (L) 10/27/2019 0719   CBC    Component Value Date/Time   WBC 10.3 10/27/2019 0719   RBC 2.99 (L) 10/27/2019 0719   HGB 8.5 (L) 10/27/2019 0719   HCT 27.1 (L) 10/27/2019 0719   PLT 213 10/27/2019 0719   MCV 90.6 10/27/2019 0719   MCH 28.4 10/27/2019 0719   MCHC 31.4 10/27/2019 0719   RDW 16.0 (H) 10/27/2019 0719   LYMPHSABS 0.4 (L) 10/27/2019 0719   MONOABS 1.2 (H) 10/27/2019 0719   EOSABS 0.1 10/27/2019 0719   BASOSABS 0.0 10/27/2019 0719    Assessment/Plan:  1. AKI/CKD stage IV- in setting of acute on chronic diastolic CHF and diuresis. His  baselineis3-3.5 and is followed [facility].  BUN/Cr continue to climb with attempted IV diuresis, remains markedly volume overloaded and is not making much urine.  Plans to  initiate HD this hospitalization.  I dont think he completely realizes the gravity of this situation, his cousin is aware.   [facility] can place access on Friday but feel like he may need prior to that-  Have put in order via IR to see if we can get him HD in the next 24 hours 2. Acute on chronic diastolic CHF-not responding to high dose diuretics- will stop  3. Urinary retention- s/p foley catheter and mayneed to be discharged with this 4. Anemia of CKD stage IV- hgbdropped to 6.8 and received a unit of PRBC's at this time.Received 2 doses IV feraheme on 5/1 and Aranesp 40 mcg on 4/30. will inc dose of ESA- is stable 5. DM type 2 - per primary svc 6. Bones-  added binder which he cannot take right now due to somnolence - PTH 172 7. Hyponatremia-  Hypervolemic hyponatremia-  Diuretics- if fails , then dialysis    [place] [phone]

## 2019-10-27 NOTE — Progress Notes (Signed)
   10/27/19 1043  Assess: MEWS Score  Temp (!) 92.3 F (33.5 C) (Rectal)  BP 100/65  Pulse Rate (!) 57  Level of Consciousness Alert (hoh and Blind)  SpO2 92 %  O2 Device Room Air  Patient Activity (if Appropriate) In bed  O2 Flow Rate (L/min) 2 L/min  Assess: MEWS Score  MEWS Temp 2  MEWS Systolic 1  MEWS Pulse 0  MEWS RR 1  MEWS LOC 0  MEWS Score 4  MEWS Score Color Red  Assess: if the MEWS score is Yellow or Red  Were vital signs taken at a resting state? Yes  Focused Assessment Documented focused assessment  Early Detection of Sepsis Score *See Row Information* Medium  MEWS guidelines implemented *See Row Information* No, vital signs rechecked  Treat  MEWS Interventions Other (Comment)  Take Vital Signs  Increase Vital Sign Frequency  Red: Q 1hr X 4 then Q 4hr X 4, if remains red, continue Q 4hrs  Notify: Charge Nurse/RN  Name of Charge Nurse/RN Notified shebie Odem Rn  Date Charge Nurse/RN Notified 10/27/19  Time Charge Nurse/RN Notified 1101  Notify: Provider  Provider Name/Title Dr. Cyndia Skeeters   Date Provider Notified 10/27/19  Time Provider Notified 1015  Notification Type Page  Notification Reason Change in status  Response See new orders;Other (Comment) (held b/p)  Date of Provider Response 10/27/19  Time of Provider Response 1019  Document  Patient Outcome Stabilized after interventions  Progress note created (see row info) Yes

## 2019-10-27 NOTE — Progress Notes (Signed)
Inpatient Diabetes Program Recommendations  AACE/ADA: New Consensus Statement on Inpatient Glycemic Control   Target Ranges:  Prepandial:   less than 140 mg/dL      Peak postprandial:   less than 180 mg/dL (1-2 hours)      Critically ill patients:  140 - 180 mg/dL   Results for ALEXIO, SROKA (MRN [id number]) as of 10/27/2019 13:00  Ref. Range 10/26/2019 05:58 10/26/2019 12:13 10/26/2019 16:14 10/26/2019 21:09 10/27/2019 06:35 10/27/2019 07:31 10/27/2019 11:10  Glucose-Capillary Latest Ref Range: 70 - 99 mg/dL 198 (H) 178 (H) 195 (H) 236 (H) 175 (H) 173 (H) 210 (H)   Review of Glycemic Control  Current orders for Inpatient glycemic control: Novolog 0-9 units TID with meals  Inpatient Diabetes Program Recommendations:    Insulin-Correction: Please consider ordering Novolog 0-5 units QHS for bedtime correction.  Insulin-Meal Coverage: Please consider ordering Novolog 2 units TID with meals for meal coverage if patient eats at least 50% of meals.  Thanks, Barnie Alderman, RN, MSN, CDE Diabetes Coordinator Inpatient Diabetes Program [phone] (Team Pager from 8am to 5pm)

## 2019-10-27 NOTE — Progress Notes (Signed)
Patient is HOH with selective Response with perception is impaired  patient has a warming banket on. And used bedpan.

## 2019-10-27 NOTE — Significant Event (Addendum)
Rapid Response Event Note  Overview: Time Called: 1100 Arrival Time: 1105 Event Type: MEWS Hypothermia (92.3 rectal) VS: T 92.3, BP 100/65, HR 57, RR 20, SpO2 92% on 2LNC  Initial Focused Assessment: Pt lying in bed, responds to voice. Pt is HOH and blind. 3/4+ pitting edema in bilateral lower extremities. 1/2+ edema in bilateral upper extremities, left arm > right. Pulses palpable in extremities. Skin is cool to touch. Pt moves upper extremities appropriately. Speech is clear. Per RN pt has not had a major change in mentation, but has become progressively confused over the last day or so. Pt is HOH, therefor it is difficult to perform a neurological assessment. Lung sounds are rhonchus, CXR from 10/25/19 showed "Progression of congestive heart failure with edema. Enlarging bilateral pleural effusions." Foley catheter has minimal output and urine is tea colored with sediment. Abdomen is round, firm.   Interventions: -CBG 210   -Cultures ordered -Warming blanket ordered  Plan of Care (if not transferred): -Plan is for pt to have HD catheter placed today- ? Dialysis today -Warming blanket to achieve normothermia -Full set of vital signs with temperature checks, verify pt's BP is maintaining while correcting temperature -Supplemental oxygen, HOB >30 degrees  Event Summary: Name of Physician Notified: Dr. Cyndia Skeeters at 1115  At bedside to evaluate patient.  Outcome: Stayed in room and stabalized Event End Time: [place]

## 2019-10-27 NOTE — Consult Note (Signed)
Chief Complaint: Patient was seen in consultation today for tunneled dialysis catheter placement Chief Complaint  Patient presents with  . Diarrhea  . Weakness   at the request of Dr Shirl Harris   Supervising Physician: Arne Cleveland  Patient Status: [facility] - In-pt  History of Present Illness: Christopher Davila is a 49 y.o. male   Hx HTN; DM; gastroparesis Blindness; CHF Admitted with Diarrhea- general weakness Acute on chronic renal failure  Worsening failure; Cr 6.5 today To initiate dilaysis asap  Nephrology ordering tunneled HD catheter placement Scheduled for 5/13 in IR    Past Medical History:  Diagnosis Date  . Abdominal pain 12/28/2012  . Acute kidney injury superimposed on chronic kidney disease ([place])   . Blindness 09/04/2019  . Chest pain 02/14/2014  . Diabetes mellitus   . Diarrhea 10/14/2019  . Dyspnea   . Epigastric abdominal pain   . Gastroparesis due to DM ([place])   . Hypertension   . Hypoalbuminemia 04/17/2019  . Hypoxia   . Nausea   . Neuropathy of lower extremity   . Oral candidiasis 06/26/2012  . Pancreatitis 06/26/2012  . Weight loss 06/26/2012  . Wheezing     Past Surgical History:  Procedure Laterality Date  . EYE SURGERY      Allergies: Morphine and related  Medications: Prior to Admission medications   Medication Sig Start Date End Date Taking? Authorizing Provider  acetaminophen (TYLENOL) 500 MG tablet Take 1,000 mg by mouth every 6 (six) hours as needed for mild pain or moderate pain.   Yes [provider]  aspirin EC 81 MG tablet Take 1 tablet (81 mg total) by mouth daily. 05/24/15  Yes Ghimire, Henreitta Leber, MD  brimonidine (ALPHAGAN P) 0.1 % SOLN Place 1 drop into the left eye 2 (two) times daily.    Yes [provider]  carvedilol (COREG) 25 MG tablet Take 1 tablet (25 mg total) by mouth 2 (two) times daily with a meal. 02/19/19  Yes Gladys Damme, MD  dorzolamide-timolol (COSOPT) 22.3-6.8 MG/ML ophthalmic  solution Place 1 drop into the left eye 2 (two) times daily. 09/23/19  Yes Vann, Jessica U, DO  furosemide (LASIX) 40 MG tablet Take 1 tablet (40 mg total) by mouth daily. 09/27/19 12/26/19 Yes Dessa Phi, DO  gabapentin (NEURONTIN) 300 MG capsule Take 1 capsule (300 mg total) by mouth 2 (two) times daily. Patient taking differently: Take 300 mg by mouth 3 (three) times daily.  09/23/19  Yes Vann, Jessica U, DO  glipiZIDE (GLUCOTROL) 10 MG tablet Take 1 tablet (10 mg total) by mouth 2 (two) times daily. 09/23/19  Yes Eulogio Bear U, DO  hydrALAZINE (APRESOLINE) 25 MG tablet Take 0.5 tablets (12.5 mg total) by mouth 3 (three) times daily. 09/23/19  Yes Vann, Jessica U, DO  latanoprost (XALATAN) 0.005 % ophthalmic solution Place 1 drop into the left eye at bedtime. 09/23/19  Yes Vann, Jessica U, DO  moxifloxacin (VIGAMOX) 0.5 % ophthalmic solution Place 1 drop into the left eye 2 (two) times daily.  09/24/19  Yes [provider]  sodium bicarbonate 650 MG tablet Take 1 tablet (650 mg total) by mouth 3 (three) times daily. 09/23/19  Yes Geradine Girt, DO     Family History  Problem Relation Age of Onset  . Diabetes Mother   . Lung disease Mother   . Hypertension Mother   . Diabetes Maternal Aunt   . CAD Maternal Aunt   . CAD Cousin  Social History   Socioeconomic History  . Marital status: Single    Spouse name: Not on file  . Number of children: Not on file  . Years of education: Not on file  . Highest education level: Not on file  Occupational History  . Occupation: dish washer, unemployed  Tobacco Use  . Smoking status: Former Smoker    Packs/day: 0.00    Types: Cigarettes    Quit date: 03/22/2015    Years since quitting: 4.6  . Smokeless tobacco: Never Used  Substance and Sexual Activity  . Alcohol use: Not Currently  . Drug use: No  . Sexual activity: Not on file  Other Topics Concern  . Not on file  Social History Narrative  . Not on file   Social Determinants of  Health   Financial Resource Strain:   . Difficulty of Paying Living Expenses:   Food Insecurity:   . Worried About Charity fundraiser in the Last Year:   . Arboriculturist in the Last Year:   Transportation Needs:   . Film/video editor (Medical):   Marland Kitchen Lack of Transportation (Non-Medical):   Physical Activity:   . Days of Exercise per Week:   . Minutes of Exercise per Session:   Stress:   . Feeling of Stress :   Social Connections:   . Frequency of Communication with Friends and Family:   . Frequency of Social Gatherings with Friends and Family:   . Attends Religious Services:   . Active Member of Clubs or Organizations:   . Attends Archivist Meetings:   Marland Kitchen Marital Status:     Review of Systems: A 12 point ROS discussed and pertinent positives are indicated in the HPI above.  All other systems are negative.  Review of Systems  Constitutional: Positive for activity change, appetite change and fatigue. Negative for fever.  Respiratory: Positive for shortness of breath. Negative for cough.   Cardiovascular: Negative for chest pain.  Gastrointestinal: Positive for nausea. Negative for abdominal pain.  Neurological: Positive for weakness.  Psychiatric/Behavioral: Positive for decreased concentration. Negative for behavioral problems.    Vital Signs: BP 114/77   Pulse 60   Temp (!) 92.3 F (33.5 C) Comment: Rectal  Resp 20   Ht 5\' 3"  (1.6 m)   Wt 179 lb (81.2 kg)   SpO2 92%   BMI 31.71 kg/m   Physical Exam Vitals reviewed.  Cardiovascular:     Rate and Rhythm: Normal rate and regular rhythm.     Heart sounds: Normal heart sounds.  Pulmonary:     Breath sounds: Normal breath sounds.  Abdominal:     Palpations: Abdomen is soft.  Musculoskeletal:        General: Swelling present.  Skin:    General: Skin is warm.  Neurological:     Comments: Sleepy;  Groggy [place] but no communication  Psychiatric:     Comments: Spoke to cousin Emrys Mckamie POA She consents for procedure via phone     Imaging: CT Chest Wo Contrast  Result Date: 10/05/2019 CLINICAL DATA:  Shortness of breath EXAM: CT CHEST WITHOUT CONTRAST TECHNIQUE: Multidetector CT imaging of the chest was performed following the standard protocol without IV contrast. COMPARISON:  11/21/2007 FINDINGS: Cardiovascular: Heart is mildly enlarged.  Aorta is normal caliber. Mediastinum/Nodes: Mediastinal adenopathy. Prevascular nodes have a short axis diameter of 11 mm. Similarly sized AP window and paratracheal lymph nodes. Lungs/Pleura: Moderate bilateral pleural effusions. Bilateral lower lobe  and left upper lobe airspace opacities. This could reflect asymmetric edema or infection. Upper Abdomen: Imaging into the upper abdomen shows no acute findings. Musculoskeletal: Chest wall soft tissues are unremarkable. No acute bony abnormality. IMPRESSION: Moderate bilateral pleural effusions. Bilateral airspace disease, left greater than right could reflect asymmetric edema or infection. Cardiomegaly. Mildly enlarged mediastinal lymph nodes. Electronically Signed   By: Rolm Baptise M.D.   On: 10/05/2019 03:19   DG Chest Port 1 View  Result Date: 10/25/2019 CLINICAL DATA:  Bilateral leg swelling EXAM: PORTABLE CHEST 1 VIEW COMPARISON:  For 03/06/2020 FINDINGS: Progressive diffuse bilateral airspace disease. Progressive bilateral pleural effusions and bibasilar atelectasis. Mild cardiac enlargement. IMPRESSION: Progression of congestive heart failure with edema. Enlarging bilateral pleural effusions. Electronically Signed   By: Franchot Gallo M.D.   On: 10/25/2019 11:34   DG Chest Port 1 View  Result Date: 10/14/2019 CLINICAL DATA:  Shortness of breath and leg swelling, initial encounter EXAM: PORTABLE CHEST 1 VIEW COMPARISON:  10/05/2019 FINDINGS: Cardiac shadow is enlarged but stable. Increasing effusions are seen bilaterally as well as mild vascular congestion consistent with CHF. No  focal infiltrate is noted. IMPRESSION: Increasing congestive failure with enlarging effusions and vascular congestion. Electronically Signed   By: Inez Catalina M.D.   On: 10/14/2019 15:44   DG Chest Port 1 View  Result Date: 10/05/2019 CLINICAL DATA:  Shortness of breath EXAM: PORTABLE CHEST 1 VIEW COMPARISON:  09/27/2019 FINDINGS: Increasing perihilar and lower lobe airspace opacities. Possible small left effusion. Heart is borderline in size. No acute bony abnormality. IMPRESSION: Increasing perihilar and lower lobe opacities concerning for pneumonia. Suspect small left effusion. Electronically Signed   By: Rolm Baptise M.D.   On: 10/05/2019 01:32   VAS [place] UPPER EXT VEIN MAPPING (PRE-OP AVF)  Result Date: 10/26/2019 UPPER EXTREMITY VEIN MAPPING  Indications: Pre-access. Performing Technologist: June Leap RDMS, RVT  Examination Guidelines: A complete evaluation includes B-mode imaging, spectral Doppler, color Doppler, and power Doppler as needed of all accessible portions of each vessel. Bilateral testing is considered an integral part of a complete examination. Limited examinations for reoccurring indications may be performed as noted. +-----------------+-------------+----------+-----------+ Right Cephalic   Diameter (cm)Depth (cm) Findings   +-----------------+-------------+----------+-----------+ Shoulder             2.82        0.32               +-----------------+-------------+----------+-----------+ Mid upper arm        0.27        0.16               +-----------------+-------------+----------+-----------+ Dist upper arm       0.28                           +-----------------+-------------+----------+-----------+ Antecubital fossa    0.33        0.28     IV site   +-----------------+-------------+----------+-----------+ Prox forearm                            IV bandages +-----------------+-------------+----------+-----------+ Mid forearm          0.31        0.22                +-----------------+-------------+----------+-----------+ Dist forearm         0.30                           +-----------------+-------------+----------+-----------+ +-----------------+-------------+----------+---------+  Right Basilic    Diameter (cm)Depth (cm)Findings  +-----------------+-------------+----------+---------+ Mid upper arm        0.49        0.48             +-----------------+-------------+----------+---------+ Dist upper arm       0.30        0.53   branching +-----------------+-------------+----------+---------+ Antecubital fossa    0.28        0.34   branching +-----------------+-------------+----------+---------+ +-----------------+-------------+----------+-----------+ Left Cephalic    Diameter (cm)Depth (cm) Findings   +-----------------+-------------+----------+-----------+ Shoulder             0.31        0.73               +-----------------+-------------+----------+-----------+ Mid upper arm        0.27        0.64               +-----------------+-------------+----------+-----------+ Dist upper arm       0.31        0.41               +-----------------+-------------+----------+-----------+ Antecubital fossa    0.31        0.23               +-----------------+-------------+----------+-----------+ Prox forearm         0.31                           +-----------------+-------------+----------+-----------+ Mid forearm          0.32        0.62               +-----------------+-------------+----------+-----------+ Wrist                                   IV bandages +-----------------+-------------+----------+-----------+ +-----------------+-------------+----------+---------+ Left Basilic     Diameter (cm)Depth (cm)Findings  +-----------------+-------------+----------+---------+ Mid upper arm        0.40        1.21             +-----------------+-------------+----------+---------+ Dist upper arm        0.39        1.60   branching +-----------------+-------------+----------+---------+ Antecubital fossa    0.45        1.18   branching +-----------------+-------------+----------+---------+ *See table(s) above for measurements and observations.  Diagnosing physician: Harold Barban MD Electronically signed by Harold Barban MD on 10/26/2019 at 9:46:02 PM.    Final     Labs:  CBC: Recent Labs    10/24/19 0419 10/25/19 0532 10/26/19 0611 10/27/19 0719  WBC 6.4 6.6 5.2 10.3  HGB 8.1* 8.3* 8.4* 8.5*  HCT 26.2* 26.9* 26.9* 27.1*  PLT 190 206 215 213    COAGS: Recent Labs    02/18/19 0709 06/16/19 0904 10/13/19 0756  INR 1.0 1.0 1.1  APTT  --  31  --     BMP: Recent Labs    10/24/19 0419 10/25/19 0751 10/26/19 0611 10/27/19 0719  NA 133* 133* 131* 130*  K 3.7 3.9 3.8 3.9  CL 98 96* 94* 94*  CO2 22 21* 20* 20*  GLUCOSE 155* 148* 209* 184*  BUN 84* 98* 101* 105*  CALCIUM 8.6* 8.7* 8.6* 8.9  CREATININE 4.73* 5.26* 5.84* 6.49*  GFRNONAA 14* 12* 11* 9*  GFRAA 16* 14*  12* 11*    LIVER FUNCTION TESTS: Recent Labs    09/19/19 1131 09/22/19 0504 10/05/19 0118 10/05/19 0118 10/13/19 0525 10/20/19 0529 10/24/19 0419 10/25/19 0751 10/26/19 0611 10/27/19 0719  BILITOT 0.5  --  0.5  --  0.4  --   --  1.1  --   --   AST 27  --  38  --  43*  --   --  52*  --   --   ALT 17  --  51*  --  67*  --   --  135*  --   --   ALKPHOS 76  --  105  --  137*  --   --  241*  --   --   PROT 6.7  --  6.0*  --  6.1*  --   --  6.4*  --   --   ALBUMIN 2.4*   < > 2.3*   < > 2.5*   < > 2.4* 2.5* 2.4* 2.6*   < > = values in this interval not displayed.    TUMOR MARKERS: No results for input(s): AFPTM, CEA, CA199, CHROMGRNA in the last 8760 hours.  Assessment and Plan:  A/CKD Progression renal failure To initiate dialysis asap per Nephrology Scheduled now for IR procedure in am Risks and benefits discussed with the patient's POA Cousin [name] via phone including, but not  limited to bleeding, infection, vascular injury, pneumothorax which may require chest tube placement, air embolism or even death  All questions were answered, she is agreeable to proceed. Consent signed and in chart.   Thank you for this interesting consult.  I greatly enjoyed meeting Bairon Klemann and look forward to participating in their care.  A copy of this report was sent to the requesting provider on this date.  Electronically Signed: Lavonia Drafts, PA-C 10/27/2019, 1:16 PM   I spent a total of 20 Minutes    in face to face in clinical consultation, greater than 50% of which was counseling/coordinating care for tunneled HD catheter placement

## 2019-10-27 NOTE — Progress Notes (Signed)
PT Cancellation Note  Patient Details Name: Christopher Davila MRN: [id number] DOB: [birth year]   Cancelled Treatment:    Reason Eval/Treat Not Completed: Patient declined, no reason specified. Pt reporting that he was too tired at this time. PT will continue to f/u with pt acutely as available.    [place] 10/27/2019, 2:35 PM

## 2019-10-28 ENCOUNTER — Inpatient Hospital Stay (HOSPITAL_COMMUNITY): Payer: Medicaid Other

## 2019-10-28 HISTORY — PX: IR FLUORO GUIDE CV LINE RIGHT: IMG2283

## 2019-10-28 HISTORY — PX: IR US GUIDE VASC ACCESS RIGHT: IMG2390

## 2019-10-28 LAB — RENAL FUNCTION PANEL
Albumin: 2.4 g/dL — ABNORMAL LOW (ref 3.5–5.0)
Anion gap: 19 — ABNORMAL HIGH (ref 5–15)
BUN: 111 mg/dL — ABNORMAL HIGH (ref 6–20)
CO2: 20 mmol/L — ABNORMAL LOW (ref 22–32)
Calcium: 8.7 mg/dL — ABNORMAL LOW (ref 8.9–10.3)
Chloride: 93 mmol/L — ABNORMAL LOW (ref 98–111)
Creatinine, Ser: 7 mg/dL — ABNORMAL HIGH (ref 0.61–1.24)
GFR calc Af Amer: 10 mL/min — ABNORMAL LOW (ref >60)
GFR calc non Af Amer: 8 mL/min — ABNORMAL LOW (ref >60)
Glucose, Bld: 102 mg/dL — ABNORMAL HIGH (ref 70–99)
Phosphorus: 9.1 mg/dL — ABNORMAL HIGH (ref 2.5–4.6)
Potassium: 3.9 mmol/L (ref 3.5–5.1)
Sodium: 132 mmol/L — ABNORMAL LOW (ref 135–145)

## 2019-10-28 LAB — CBC WITH DIFFERENTIAL/PLATELET
Abs Immature Granulocytes: 0.08 10*3/uL — ABNORMAL HIGH (ref 0.00–0.07)
Basophils Absolute: 0 10*3/uL (ref 0.0–0.1)
Basophils Relative: 0 %
Eosinophils Absolute: 0 10*3/uL (ref 0.0–0.5)
Eosinophils Relative: 0 %
HCT: 25.2 % — ABNORMAL LOW (ref 39.0–52.0)
Hemoglobin: 8.3 g/dL — ABNORMAL LOW (ref 13.0–17.0)
Immature Granulocytes: 1 %
Lymphocytes Relative: 7 %
Lymphs Abs: 0.9 10*3/uL (ref 0.7–4.0)
MCH: 29.1 pg (ref 26.0–34.0)
MCHC: 32.9 g/dL (ref 30.0–36.0)
MCV: 88.4 fL (ref 80.0–100.0)
Monocytes Absolute: 1.4 10*3/uL — ABNORMAL HIGH (ref 0.1–1.0)
Monocytes Relative: 11 %
Neutro Abs: 9.6 10*3/uL — ABNORMAL HIGH (ref 1.7–7.7)
Neutrophils Relative %: 81 %
Platelets: 222 10*3/uL (ref 150–400)
RBC: 2.85 MIL/uL — ABNORMAL LOW (ref 4.22–5.81)
RDW: 16.6 % — ABNORMAL HIGH (ref 11.5–15.5)
WBC: 12 10*3/uL — ABNORMAL HIGH (ref 4.0–10.5)
nRBC: 0.2 % (ref 0.0–0.2)

## 2019-10-28 LAB — GLUCOSE, CAPILLARY
Glucose-Capillary: 99 mg/dL (ref 70–99)
Glucose-Capillary: 101 mg/dL — ABNORMAL HIGH (ref 70–99)
Glucose-Capillary: 101 mg/dL — ABNORMAL HIGH (ref 70–99)
Glucose-Capillary: 92 mg/dL (ref 70–99)
Glucose-Capillary: 136 mg/dL — ABNORMAL HIGH (ref 70–99)

## 2019-10-28 LAB — AMMONIA: Ammonia: 43 umol/L — ABNORMAL HIGH (ref 9–35)

## 2019-10-28 MED ORDER — LIDOCAINE HCL 1 % IJ SOLN
INTRAMUSCULAR | Status: AC | PRN
  Administered 2019-10-28: 10 mL

## 2019-10-28 MED ORDER — FENTANYL CITRATE (PF) 100 MCG/2ML IJ SOLN
INTRAMUSCULAR | Status: AC
  Filled 2019-10-28: qty 2

## 2019-10-28 MED ORDER — SODIUM CHLORIDE 0.9 % IV SOLN
1.5000 g | INTRAVENOUS | Status: AC
  Administered 2019-10-29: 1.5 g via INTRAVENOUS
  Filled 2019-10-28 (×2): qty 1.5

## 2019-10-28 MED ORDER — CHLORHEXIDINE GLUCONATE CLOTH 2 % EX PADS
6.0000 | MEDICATED_PAD | Freq: Every day | CUTANEOUS | Status: DC
  Administered 2019-10-29: 6 via TOPICAL

## 2019-10-28 MED ORDER — MIDAZOLAM HCL 2 MG/2ML IJ SOLN
INTRAMUSCULAR | Status: AC
  Filled 2019-10-28: qty 2

## 2019-10-28 MED ORDER — LIDOCAINE HCL 1 % IJ SOLN
INTRAMUSCULAR | Status: AC
  Filled 2019-10-28: qty 20

## 2019-10-28 MED ORDER — GELATIN ABSORBABLE 12-7 MM EX MISC
CUTANEOUS | Status: AC
  Filled 2019-10-28: qty 1

## 2019-10-28 MED ORDER — CEFAZOLIN SODIUM-DEXTROSE 2-4 GM/100ML-% IV SOLN: 2.0000 g | INTRAVENOUS | Status: AC

## 2019-10-28 MED ORDER — FENTANYL CITRATE (PF) 100 MCG/2ML IJ SOLN
INTRAMUSCULAR | Status: AC | PRN
  Administered 2019-10-28: 50 ug via INTRAVENOUS

## 2019-10-28 MED ORDER — HEPARIN SODIUM (PORCINE) 1000 UNIT/ML IJ SOLN
INTRAMUSCULAR | Status: AC
  Filled 2019-10-28: qty 1

## 2019-10-28 MED ORDER — CEFAZOLIN SODIUM-DEXTROSE 2-4 GM/100ML-% IV SOLN
INTRAVENOUS | Status: AC
  Filled 2019-10-28: qty 100

## 2019-10-28 MED ORDER — CEFAZOLIN SODIUM-DEXTROSE 2-4 GM/100ML-% IV SOLN
INTRAVENOUS | Status: AC | PRN
  Administered 2019-10-28: 2 g via INTRAVENOUS

## 2019-10-28 MED ORDER — MIDAZOLAM HCL 2 MG/2ML IJ SOLN
INTRAMUSCULAR | Status: AC | PRN
  Administered 2019-10-28: 1 mg via INTRAVENOUS

## 2019-10-28 NOTE — Progress Notes (Signed)
Patient ID: Valli Glance, male   DOB: October 09, 1970, 49 y.o.   MRN: [id number]    S. Seen after HD cath-  Drowsy-  VSS- could be BUN- also very overloaded   O:BP 119/74   Pulse 65   Temp 99 F (37.2 C) (Oral)   Resp 20   Ht 5\' 3"  (1.6 m)   Wt 86.2 kg   SpO2 100%   BMI 33.66 kg/m   Intake/Output Summary (Last 24 hours) at 10/28/2019 0952 Last data filed at 10/28/2019 0500 Gross per 24 hour  Intake 240 ml  Output 125 ml  Net 115 ml   Intake/Output: I/O last 3 completed shifts: In: 1080 [P.O.:1080] Out: 326 [Urine:325; Stool:1]  Intake/Output this shift:  No intake/output data recorded. Weight change: 4.99 kg Gen: NAD- eyes closed-  Less responsive today again  CVS: no rub Resp: occ rhonchi Abd: +BS, soft, NT/ND Ext: 2+ edema  Recent Labs  Lab 10/22/19 0528 10/23/19 0858 10/24/19 0419 10/25/19 0751 10/26/19 0611 10/27/19 0719 10/28/19 0753  NA 134* 132* 133* 133* 131* 130* 132*  K 4.0 3.5 3.7 3.9 3.8 3.9 3.9  CL 99 96* 98 96* 94* 94* 93*  CO2 23 23 22  21* 20* 20* 20*  GLUCOSE 174* 194* 155* 148* 209* 184* 102*  BUN 72* 78* 84* 98* 101* 105* 111*  CREATININE 4.04* 4.12* 4.73* 5.26* 5.84* 6.49* 7.00*  ALBUMIN 2.3* 2.4* 2.4* 2.5* 2.4* 2.6* 2.4*  CALCIUM 8.8* 8.6* 8.6* 8.7* 8.6* 8.9 8.7*  PHOS 6.9* 7.4* 8.0* 9.0* 9.5* 9.1* 9.1*  AST  --   --   --  52*  --   --   --   ALT  --   --   --  135*  --   --   --    Liver Function Tests: Recent Labs  Lab 10/25/19 0751 10/25/19 0751 10/26/19 0611 10/27/19 0719 10/28/19 0753  AST 52*  --   --   --   --   ALT 135*  --   --   --   --   ALKPHOS 241*  --   --   --   --   BILITOT 1.1  --   --   --   --   PROT 6.4*  --   --   --   --   ALBUMIN 2.5*   < > 2.4* 2.6* 2.4*   < > = values in this interval not displayed.   No results for input(s): LIPASE, AMYLASE in the last 168 hours. Recent Labs  Lab 10/28/19 0753  AMMONIA 43*   CBC: Recent Labs  Lab 10/24/19 0419 10/24/19 0419 10/25/19 0532 10/25/19 0532  10/26/19 0611 10/27/19 0719 10/28/19 0753  WBC 6.4   < > 6.6   < > 5.2 10.3 12.0*  NEUTROABS 4.5   < > 4.7   < > 3.6 8.5* 9.6*  HGB 8.1*   < > 8.3*   < > 8.4* 8.5* 8.3*  HCT 26.2*   < > 26.9*   < > 26.9* 27.1* 25.2*  MCV 91.0  --  92.1  --  91.5 90.6 88.4  PLT 190   < > 206   < > 215 213 222   < > = values in this interval not displayed.   Cardiac Enzymes: No results for input(s): CKTOTAL, CKMB, CKMBINDEX, TROPONINI in the last 168 hours. CBG: Recent Labs  Lab 10/27/19 1110 10/27/19 1556 10/27/19 2116 10/28/19 0259 10/28/19 0615  GLUCAP  210* 171* 123* 99 101*    Iron Studies: No results for input(s): IRON, TIBC, TRANSFERRIN, FERRITIN in the last 72 hours. Studies/Results: CT ABDOMEN PELVIS WO CONTRAST  Result Date: 10/27/2019 CLINICAL DATA:  Shortness of breath and diarrhea. EXAM: CT ABDOMEN AND PELVIS WITHOUT CONTRAST TECHNIQUE: Multidetector CT imaging of the abdomen and pelvis was performed following the standard protocol without IV contrast. COMPARISON:  October 07, 2013 FINDINGS: Lower chest: Moderate severity areas of consolidation are seen within the bilateral lung bases. Small to moderate size bilateral pleural effusions are also seen. Hepatobiliary: No focal liver abnormality is seen. A mild amount of perihepatic fluid is seen. No gallstones, gallbladder wall thickening, or biliary dilatation. Pancreas: Unremarkable. No pancreatic ductal dilatation or surrounding inflammatory changes. Spleen: Normal in size without focal abnormality. Adrenals/Urinary Tract: Adrenal glands are unremarkable. Kidneys are normal, without renal calculi, focal lesion, or hydronephrosis. A Foley catheter is seen within the urinary bladder. Stomach/Bowel: Stomach is within normal limits. The appendix is not clearly visualized no evidence of bowel wall thickening, distention, or inflammatory changes. Vascular/Lymphatic: There is mild calcification of the abdominal aorta. No enlarged abdominal or pelvic  lymph nodes. Reproductive: Prostate is unremarkable. Other: There is a mild amount of abdominopelvic free fluid. Musculoskeletal: Marked severity edema and subcutaneous inflammatory fat stranding seen throughout the abdominal and pelvic walls. No acute osseous abnormalities are seen. IMPRESSION: 1. Moderate severity bibasilar consolidation and small to moderate size bilateral pleural effusions. 2. Marked severity edema and subcutaneous inflammatory fat stranding throughout the abdominal and pelvic walls. 3. Mild amount of abdominopelvic free fluid. Aortic Atherosclerosis (ICD10-I70.0). Electronically Signed   By: Virgina Norfolk M.D.   On: 10/27/2019 19:18   CT HEAD WO CONTRAST  Result Date: 10/27/2019 CLINICAL DATA:  Altered mental status, fatigue EXAM: CT HEAD WITHOUT CONTRAST TECHNIQUE: Contiguous axial images were obtained from the base of the skull through the vertex without intravenous contrast. COMPARISON:  CT 06/15/2019, MRI 06/16/2019 FINDINGS: Brain: Stable sequela of remote left basal ganglia infarct. No evidence of acute infarction, hemorrhage, hydrocephalus, extra-axial collection or mass lesion/mass effect. Symmetric prominence of the ventricles, cisterns and sulci compatible with parenchymal volume loss. Patchy areas of white matter hypoattenuation are most compatible with chronic microvascular angiopathy. Chronic dural calcifications are similar to priors. Vascular: Atherosclerotic calcification of the carotid siphons. No hyperdense vessel. Skull: No calvarial fracture or suspicious osseous lesion. No scalp swelling or hematoma. Sinuses/Orbits: Mild nodular mural thickening in the maxillary sinuses with a small layering air-fluid level in the left sphenoid sinus. There is rightward nasal septal deviation with a right-sided nasal septal spur. Other: Extensive periodontal disease with multiple carious lesions and periapical lucencies. IMPRESSION: 1. No acute intracranial abnormality. 2. Stable  sequela of remote left basal ganglia infarct. 3. Stable parenchymal volume loss and chronic microvascular angiopathy. 4. Small layering air-fluid level in the left sphenoid sinus, could assess for clinical symptoms of acute sinusitis. 5. Periodontal disease, could correlate with outpatient dental examination Electronically Signed   By: Lovena Le M.D.   On: 10/27/2019 19:16   VAS [place] UPPER EXT VEIN MAPPING (PRE-OP AVF)  Result Date: 10/26/2019 UPPER EXTREMITY VEIN MAPPING  Indications: Pre-access. Performing Technologist: June Leap RDMS, RVT  Examination Guidelines: A complete evaluation includes B-mode imaging, spectral Doppler, color Doppler, and power Doppler as needed of all accessible portions of each vessel. Bilateral testing is considered an integral part of a complete examination. Limited examinations for reoccurring indications may be performed as noted. +-----------------+-------------+----------+-----------+ Right Cephalic  Diameter (cm)Depth (cm) Findings   +-----------------+-------------+----------+-----------+ Shoulder             2.82        0.32               +-----------------+-------------+----------+-----------+ Mid upper arm        0.27        0.16               +-----------------+-------------+----------+-----------+ Dist upper arm       0.28                           +-----------------+-------------+----------+-----------+ Antecubital fossa    0.33        0.28     IV site   +-----------------+-------------+----------+-----------+ Prox forearm                            IV bandages +-----------------+-------------+----------+-----------+ Mid forearm          0.31        0.22               +-----------------+-------------+----------+-----------+ Dist forearm         0.30                           +-----------------+-------------+----------+-----------+ +-----------------+-------------+----------+---------+ Right Basilic    Diameter (cm)Depth  (cm)Findings  +-----------------+-------------+----------+---------+ Mid upper arm        0.49        0.48             +-----------------+-------------+----------+---------+ Dist upper arm       0.30        0.53   branching +-----------------+-------------+----------+---------+ Antecubital fossa    0.28        0.34   branching +-----------------+-------------+----------+---------+ +-----------------+-------------+----------+-----------+ Left Cephalic    Diameter (cm)Depth (cm) Findings   +-----------------+-------------+----------+-----------+ Shoulder             0.31        0.73               +-----------------+-------------+----------+-----------+ Mid upper arm        0.27        0.64               +-----------------+-------------+----------+-----------+ Dist upper arm       0.31        0.41               +-----------------+-------------+----------+-----------+ Antecubital fossa    0.31        0.23               +-----------------+-------------+----------+-----------+ Prox forearm         0.31                           +-----------------+-------------+----------+-----------+ Mid forearm          0.32        0.62               +-----------------+-------------+----------+-----------+ Wrist                                   IV bandages +-----------------+-------------+----------+-----------+ +-----------------+-------------+----------+---------+ Left Basilic     Diameter (cm)Depth (cm)Findings  +-----------------+-------------+----------+---------+ Mid upper arm  0.40        1.21             +-----------------+-------------+----------+---------+ Dist upper arm       0.39        1.60   branching +-----------------+-------------+----------+---------+ Antecubital fossa    0.45        1.18   branching +-----------------+-------------+----------+---------+ *See table(s) above for measurements and observations.  Diagnosing physician: Harold Barban MD Electronically signed by Harold Barban MD on 10/26/2019 at 9:46:02 PM.    Final    . aspirin EC  81 mg Oral Daily  . bisacodyl  10 mg Rectal Once  . brimonidine  1 drop Left Eye BID  . calcium acetate  1,334 mg Oral TID WC  . Chlorhexidine Gluconate Cloth  6 each Topical Daily  . Chlorhexidine Gluconate Cloth  6 each Topical Q0600  . darbepoetin (ARANESP) injection - NON-DIALYSIS  100 mcg Subcutaneous Q Mon-1800  . dorzolamide-timolol  1 drop Left Eye BID  . fentaNYL      . gatifloxacin  1 drop Left Eye QID  . gelatin adsorbable      . heparin sodium (porcine)      . hydrALAZINE  25 mg Oral TID  . insulin aspart  0-9 Units Subcutaneous TID WC  . latanoprost  1 drop Left Eye QHS  . lidocaine      . midazolam      . senna-docusate  1 tablet Oral BID  . sodium bicarbonate  650 mg Oral Daily    BMET    Component Value Date/Time   NA 132 (L) 10/28/2019 0753   K 3.9 10/28/2019 0753   CL 93 (L) 10/28/2019 0753   CO2 20 (L) 10/28/2019 0753   GLUCOSE 102 (H) 10/28/2019 0753   BUN 111 (H) 10/28/2019 0753   CREATININE 7.00 (H) 10/28/2019 0753   CALCIUM 8.7 (L) 10/28/2019 0753   GFRNONAA 8 (L) 10/28/2019 0753   GFRAA 10 (L) 10/28/2019 0753   CBC    Component Value Date/Time   WBC 12.0 (H) 10/28/2019 0753   RBC 2.85 (L) 10/28/2019 0753   HGB 8.3 (L) 10/28/2019 0753   HCT 25.2 (L) 10/28/2019 0753   PLT 222 10/28/2019 0753   MCV 88.4 10/28/2019 0753   MCH 29.1 10/28/2019 0753   MCHC 32.9 10/28/2019 0753   RDW 16.6 (H) 10/28/2019 0753   LYMPHSABS 0.9 10/28/2019 0753   MONOABS 1.4 (H) 10/28/2019 0753   EOSABS 0.0 10/28/2019 0753   BASOSABS 0.0 10/28/2019 0753    Assessment/Plan:  1. AKI/CKD stage IV- in setting of acute on chronic diastolic CHF and diuresis. His baselineis3-3.5 and is followed by Dr. Carolin Sicks at [facility].  BUN/Cr continue to climb with attempted IV diuresis, remains markedly volume overloaded and is not making much urine.  Plans to  initiate HD this  hospitalization- I suspect is at ESRD.  I dont think he completely realizes the gravity of this situation, his cousin is aware.   VVS can place access on Friday but feel like he may need prior to that-  Appreciate IR-  Now will be able to get HD today for volume and uremia 2. Acute on chronic diastolic CHF-not responding to high dose diuretics- will stop  3. Urinary retention- s/p foley catheter and may need to be discharged with this- depends on how much urine he is making 4. Anemia of CKD stage IV- hgbdropped to 6.8 and received a unit of PRBC's at this time.Received 2  doses IV feraheme on 5/1 and Aranesp 40 mcg on 4/30. will inc dose of ESA- is stable 5. DM type 2 - per primary svc 6. Bones-  added binder which he cannot take right now due to somnolence - PTH 172 7. Hyponatremia-  Hypervolemic hyponatremia-  dialysis should help   Louis Meckel  [employer] [phone]

## 2019-10-28 NOTE — Consult Note (Addendum)
Consultation Note Date: 10/28/2019   Patient Name: Christopher Davila  DOB: 08-28-1970  MRN: [id number]  Age / Sex: 49 y.o., male  PCP: Placey, Audrea Muscat, NP Referring Physician: Mercy Riding, MD  Reason for Consultation: Establishing goals of care  HPI/Patient Profile: 49 y.o. male  with past medical history of DM w/ gastroparesis and blindness, CKD IV, HFpEF who was admitted on 10/13/2019 with diarrhea.  He was found to have acute on chronic CKD and severe acidosis.   He was hydrated for Christopher acidosis and developed volume overload (BNP 913).  He subsequently was found to be hypothermic and have altered mental status.  U/A revealed UTI.  Nephrology evaluated and started urgent hemodialysis.   Clinical Assessment and Goals of Care:  I have reviewed medical records including EPIC notes, labs and imaging, received report from Dr. Cyndia Davila, examined Christopher patient and talked with him at bedside  to discuss diagnosis prognosis, GOC, EOL wishes, disposition and options.  Our visit was abbreviated as Christopher Davila was being taken to Christopher OR to have a fistula placed.  After talking with Christopher Davila, I called his cousin Christopher Davila and talked with her as well.  I introduced Palliative Medicine as specialized medical care for people living with serious illness. It focuses on providing relief from Christopher symptoms and stress of a serious illness.   Christopher Davila seems to act as care taker for Christopher family.  She cared for Christopher Davila's sister while she was dying.  Now that she has passed Christopher Davila is housing and raising her daughter.  Christopher Davila cares for Christopher Davila.  She comments "Its just what you do for family. You have to do what you have to do."  As far as functional and nutritional status, Christopher Davila reports that his appetite has been ok and he is eating.   We discussed who he would want as HCPOA if he could  Not make his own medical decisions - Christopher Davila quickly indicates he would  want Christopher Davila.  I briefly asked Christopher Davila about code status - he requests resuscitation.  Then our meeting ended as he went for his procedure.  Christopher Davila tells me that over Christopher last year Christopher Davila's mobility has decreased and he does not ambulate very much.  She feels that he chooses not to get up and walk. We discussed his current illness and what it means in Christopher larger context of his on-going co-morbidities.  Natural disease trajectory and expectations at EOL were discussed.  Specifically Christopher Davila and I talked about ESRD and CHF.  I explained HD cath vs fistula and why permanent access was needed.  We talked about HD being particularly difficult in patients with heart disease.   We talked about HD as life support.  Christopher Davila understood and stated that Christopher Davila should understand as Christopher Davila sister died last month from missing HD appointments.  Christopher Davila was present when his sister died.  I talked with Christopher Davila about HCPOA and Durable POA and how to obtain both of them in order to care for Christopher Davila.  We  talked about disposition.  Christopher Davila has limitations with regard to what she can offer Christopher Davila as she lives in [place] provided housing and she is hopeful he will be able to go to SNF or ALF.  She comments that they have filed for Medicaid for him and it is in process.   Hospice and Palliative Care services outpatient were explained and offered.  Christopher Davila requested that Palliative follow Christopher Davila whether at Christopher Davila or at home.  Questions and concerns were addressed.  Christopher family was encouraged to call with questions or concerns.        Primary Decision Maker:  PATIENT    SUMMARY OF RECOMMENDATIONS     Chaplain requested to help Christopher Davila complete HCPOA.  His cousin Christopher Davila is his designated person.  Family hopeful for dc to SNF or ALF.  Medicaid is pending.  Needs Palliative to follow outpatient for more comprehensive discussion with Christopher Davila.  [facility] requests Palliative outpatient.  Full code / Full scope.   Christopher Davila is very realistic and  aware of Christopher Davila's co-morbidities.  Code Status/Advance Care Planning:  Full   Symptom Management:   Per primary.    Additional Recommendations (Limitations, Scope, Preferences):  Full Scope Treatment  Psycho-social/Spiritual:   Desire for further Chaplaincy support: requested HCPOA paperwork.  Prognosis:   Less than 1 year would not be unexpected.  Discharge Planning: [name] for rehab with Palliative care service follow-up      Primary Diagnoses: Present on Admission: . Diarrhea . Type 2 diabetes mellitus with hypoglycemia (Christopher Davila) . CKD (chronic kidney disease) stage 4, GFR 15-29 ml/min (HCC) . Essential hypertension . (Resolved) Hypertension . Hyperlipidemia . Metabolic acidosis, increased anion gap . Acute urinary retention   I have reviewed Christopher medical record, interviewed Christopher patient and family, and examined Christopher patient. Christopher following aspects are pertinent.  Past Medical History:  Diagnosis Date  . Abdominal pain 12/28/2012  . Acute kidney injury superimposed on chronic kidney disease ([place])   . Blindness 09/04/2019  . Chest pain 02/14/2014  . Diabetes mellitus   . Diarrhea 10/14/2019  . Dyspnea   . Epigastric abdominal pain   . Gastroparesis due to DM ([place])   . Hypertension   . Hypoalbuminemia 04/17/2019  . Hypoxia   . Nausea   . Neuropathy of lower extremity   . Oral candidiasis 06/26/2012  . Pancreatitis 06/26/2012  . Weight loss 06/26/2012  . Wheezing    Social History   Socioeconomic History  . Marital status: Single    Spouse name: Not on file  . Number of children: Not on file  . Years of education: Not on file  . Highest education level: Not on file  Occupational History  . Occupation: dish washer, unemployed  Tobacco Use  . Smoking status: Former Smoker    Packs/day: 0.00    Types: Cigarettes    Quit date: 03/22/2015    Years since quitting: 4.6  . Smokeless tobacco: Never Used  Substance and Sexual Activity  .  Alcohol use: Not Currently  . Drug use: No  . Sexual activity: Not on file  Other Topics Concern  . Not on file  Social History Narrative  . Not on file   Social Determinants of Health   Financial Resource Strain:   . Difficulty of Paying Living Expenses:   Food Insecurity:   . Worried About Charity fundraiser in [place] Last Year:   . Arboriculturist in [place] Last Year:   Transportation Needs:   .  Lack of Transportation (Medical):   Marland Kitchen Lack of Transportation (Non-Medical):   Physical Activity:   . Days of Exercise per Week:   . Minutes of Exercise per Session:   Stress:   . Feeling of Stress :   Social Connections:   . Frequency of Communication with Friends and Family:   . Frequency of Social Gatherings with Friends and Family:   . Attends Religious Services:   . Active Member of Clubs or Organizations:   . Attends Archivist Meetings:   Marland Kitchen Marital Status:    Family History  Problem Relation Age of Onset  . Diabetes Mother   . Lung disease Mother   . Hypertension Mother   . Diabetes Maternal Aunt   . CAD Maternal Aunt   . CAD Cousin    Scheduled Meds: . aspirin EC  81 mg Oral Daily  . bisacodyl  10 mg Rectal Once  . brimonidine  1 drop Left Eye BID  . calcium acetate  1,334 mg Oral TID WC  . Chlorhexidine Gluconate Cloth  6 each Topical Daily  . darbepoetin (ARANESP) injection - NON-DIALYSIS  100 mcg Subcutaneous Q Mon-1800  . dorzolamide-timolol  1 drop Left Eye BID  . gatifloxacin  1 drop Left Eye QID  . hydrALAZINE  25 mg Oral TID  . insulin aspart  0-9 Units Subcutaneous TID WC  . latanoprost  1 drop Left Eye QHS  . senna-docusate  1 tablet Oral BID  . sodium bicarbonate  650 mg Oral Daily   Continuous Infusions: . sodium chloride 250 mL (10/16/19 0909)  .  ceFAZolin (ANCEF) IV     PRN Meds:.sodium chloride, acetaminophen **OR** acetaminophen Allergies  Allergen Reactions  . Morphine And Related Itching and Other (See Comments)    Pt prefers not  to be given this drug      Vital Signs: BP 125/80 (BP Location: Right Arm)   Pulse 69   Temp 99 F (37.2 C) (Oral)   Resp 18   Ht 5\' 3"  (1.6 m)   Wt 86.2 kg   SpO2 98%   BMI 33.66 kg/m  Pain Scale: 0-10   Pain Score: Asleep(Arousable)   SpO2: SpO2: 98 % O2 Device:SpO2: 98 % O2 Flow Rate: .O2 Flow Rate (L/min): 2 L/min  IO: Intake/output summary:   Intake/Output Summary (Last 24 hours) at 10/28/2019 0625 Last data filed at 10/28/2019 0500 Gross per 24 hour  Intake 600 ml  Output 226 ml  Net 374 ml    LBM: Last BM Date: 10/28/19 Baseline Weight: Weight: 64.4 kg Most recent weight: Weight: 86.2 kg     Palliative Assessment/Data: 40%     Time In: 10:00 Time Out: 10:50 Time Total: 50 min. Visit consisted of counseling and education dealing with Christopher complex and emotionally intense issues surrounding Christopher need for palliative care and symptom management in Christopher setting of serious and potentially life-threatening illness. Greater than 50%  of this time was spent counseling and coordinating care related to Christopher above assessment and plan.  Signed by: Florentina Jenny, PA-C Palliative Medicine  Please contact Palliative Medicine Team phone at [phone] for questions and concerns.  For individual provider: See Shea Evans

## 2019-10-28 NOTE — Progress Notes (Signed)
Received pt post HD Cath placement still very drowsy. Responds to voice but easily falls back to sleep. MEWS score 2. Will monitor accordingly.

## 2019-10-28 NOTE — Progress Notes (Signed)
Renal Navigator has been contacted by Nephrologist that patient needs to be referred for OP HD treatment. Navigator has received report that patient is homeless. Navigator also notes that patient is uninsured. Renal Navigator attempted to meet with patient to discuss his situation and referral, however, he was sleeping with his mask covering his eyes when I arrived to his room this morning and did not rouse when I knocked on the door or spoke his name near him. Navigator will attempt again at a later time/attempt to reach out to a family contact listed in [place].  Alphonzo Cruise, [place] Renal Navigator [phone]

## 2019-10-28 NOTE — Progress Notes (Signed)
Occupational Therapy Treatment Patient Details Name: Christopher Davila MRN: [id number] DOB: [birth year] Today's Date: 10/28/2019    History of present illness Pt is an 49 y.o. male with PMH DM, HTN, CKD stage IIIb, peripheral neuropathy, diabetic retinopathy, bilateral vision loss, glaucoma, chronic diastolic heart failure who presented to ED with increased weakness.   OT comments  Pt seen for OT follow up, with focus on scrotal sling fabrication and BADL mobility progression. Pt presents with increased weakness and decline in activity tolerance from previous sessions. Bed mobility and sit <> stand in stedy was completed with max A +2. Pt stood in stedy X3 with max A for total A peri care for BM incontinence. Scrotal sling fabricated and fit to patient comfort due to scrotum and penis swelling. Educated Conservation officer, historic buildings of wear and care of scrotal sling. D/c recs remain appropriate, will continue to follow.   Follow Up Recommendations  SNF;Supervision/Assistance - 24 hour    Equipment Recommendations  Other (comment)(defer to next venue)    Recommendations for Other Services      Precautions / Restrictions Precautions Precautions: Fall;Other (comment) Precaution Comments: blind; scrotal swelling Required Braces or Orthoses: Other Brace Other Brace: scrotal sling Restrictions Weight Bearing Restrictions: No       Mobility Bed Mobility Overal bed mobility: Needs Assistance Bed Mobility: Supine to Sit     Supine to sit: Max assist;+2 for physical assistance     General bed mobility comments: max A +2 to guide BLEs off of bed, use bed pad to pivot hips, and support trunk into upright  Transfers Overall transfer level: Needs assistance Equipment used: Ambulation equipment used Transfers: Sit to/from Stand Sit to Stand: Max assist;+2 physical assistance;+2 safety/equipment         General transfer comment: max A +2 to rise and stand in stedy x3 for initial stand and peri care  attempts    Balance Overall balance assessment: Needs assistance Sitting-balance support: No upper extremity supported;Feet supported Sitting balance-Leahy Scale: Poor Sitting balance - Comments: reliant on at least min A for safe sitting EOB   Standing balance support: Bilateral upper extremity supported;During functional activity Standing balance-Leahy Scale: Zero Standing balance comment: needing assist of stedy or max A +2 from therapists                           ADL either performed or assessed with clinical judgement   ADL Overall ADL's : Needs assistance/impaired                         Toilet Transfer: Maximal assistance;+2 for physical assistance;+2 for safety/equipment Toilet Transfer Details (indicate cue type and reason): max A +2 to power up into standing in stedy, pt then incontinent of stool Toileting- Clothing Manipulation and Hygiene: Total assistance;Sit to/from stand;Bed level Toileting - Clothing Manipulation Details (indicate cue type and reason): total A for peri area clean up partially standing in stedy, partially lying in bed.     Functional mobility during ADLs: Maximal assistance;+2 for physical assistance;+2 for safety/equipment(sit <> stand in stedy only) General ADL Comments: pt appears to have a decreased activity tolerance this date, requiring max A +2 to stand in stedy. Session also focused on scrotal sling fabrication     Vision Baseline Vision/History: Legally blind Patient Visual Report: No change from baseline Additional Comments: legally blind at baseline   Perception     Praxis  Cognition Arousal/Alertness: Awake/alert Behavior During Therapy: Flat affect Overall Cognitive Status: Impaired/Different from baseline Area of Impairment: Memory;Safety/judgement;Problem solving                     Memory: Decreased short-term memory   Safety/Judgement: Decreased awareness of safety;Decreased awareness of  deficits   Problem Solving: Slow processing;Requires verbal cues;Requires tactile cues General Comments: increased time and cueing to process basic mobility tasks. Often needing commands repeated or not remembering events prior in the day        Exercises     Shoulder Instructions       General Comments 3L [place] throughout session    Pertinent Vitals/ Pain       Pain Assessment: Faces Faces Pain Scale: Hurts little more Pain Location: scrotum, penis Pain Descriptors / Indicators: Aching;Sore Pain Intervention(s): Repositioned;Other (comment)(scrotal sling and elevation)  Home Living                                          Prior Functioning/Environment              Frequency  Min 2X/week        Progress Toward Goals  OT Goals(current goals can now be found in the care plan section)  Progress towards OT goals: Not progressing toward goals - comment(decreased activity tolerance and overall strength noted this date- needing more assistance)  Acute Rehab OT Goals Patient Stated Goal: reduce pain OT Goal Formulation: With patient Time For Goal Achievement: 11/18/19 Potential to Achieve Goals: Good  Plan Discharge plan remains appropriate;Frequency remains appropriate    Co-evaluation    PT/OT/SLP Co-Evaluation/Treatment: Yes Reason for Co-Treatment: For patient/therapist safety;To address functional/ADL transfers   OT goals addressed during session: ADL's and self-care;Proper use of Adaptive equipment and DME;Strengthening/ROM(scrotal sling fabrication)      AM-PAC OT "6 Clicks" Daily Activity     Outcome Measure   Help from another person eating meals?: A Little Help from another person taking care of personal grooming?: A Little Help from another person toileting, which includes using toliet, bedpan, or urinal?: A Lot Help from another person bathing (including washing, rinsing, drying)?: A Lot Help from another person to put on and  taking off regular upper body clothing?: A Lot Help from another person to put on and taking off regular lower body clothing?: Total 6 Click Score: 13    End of Session Equipment Utilized During Treatment: Gait belt;Oxygen;Other (comment)(stedy)  OT Visit Diagnosis: Other abnormalities of gait and mobility (R26.89);Unsteadiness on feet (R26.81);Pain;Muscle weakness (generalized) (M62.81) Pain - part of body: (scrotum)   Activity Tolerance Patient tolerated treatment well   Patient Left with call bell/phone within reach;with bed alarm set   Nurse Communication Mobility status;Need for lift equipment        Time: [phone] OT Time Calculation (min): 53 min  Charges: OT General Charges $OT Visit: 1 Visit OT Treatments $Self Care/Home Management : 23-37 mins  Zenovia Jarred, MSOT, OTR/L Acute Rehabilitation Services [facility] Office Number: [phone] Pager: [phone]  Zenovia Jarred 10/28/2019, 5:32 PM

## 2019-10-28 NOTE — Progress Notes (Signed)
PROGRESS NOTE  Christopher Davila FYB:[id number] DOB: 12/25/1970   PCP: Marliss Coots, NP  Patient is from: Home  DOA: 10/13/2019 LOS: 17  Brief Narrative / Interim history: 49 year old male with history of hypertension, diabetes mellitus/gastroparesis/blindness, CKD stage IV, chronic diastolic CHF admitted with diarrhea, weakness and failure to thrive. He was  recently admitted 4/10-12 for acute on chronic diastolic CHF.  He returned to the ER on 4/20 but did not qualify for hospitalization at that time and he has been not able to be placed in a SNF. In the ED had severe acidosis with a gap, given bicarb IV fluids and was admitted. Diarrhea has since resolved C. difficile negative GI panel negative.  Patient developed progressive fluid overload with lower leg edema acute on chronic diastolic CHF. Was started on lasix drip and metolazone but remained fluid overloaded with oliguria and uremic symptoms.  Plan to initiate dialysis.  IR consulted by nephrology for [facility].  VVF to place permanent access on 5/14.  Subjective: Seen and examined earlier this morning.  No major events overnight of this morning.  He says he feels "like a shit" but couldn't elaborate.  He denies pain, shortness of breath, nausea, vomiting or abdominal pain.  Objective: Vitals:   10/28/19 0938 10/28/19 0942 10/28/19 0947 10/28/19 1000  BP: 112/69 119/74 123/79 129/78  Pulse: 65 65 65 66  Resp: 20     Temp:      TempSrc:      SpO2: 100% 100% 100% 98%  Weight:      Height:        Intake/Output Summary (Last 24 hours) at 10/28/2019 1041 Last data filed at 10/28/2019 0500 Gross per 24 hour  Intake 240 ml  Output 125 ml  Net 115 ml   Filed Weights   10/26/19 0010 10/27/19 0350 10/28/19 0534  Weight: 79.8 kg 81.2 kg 86.2 kg    Examination:  GENERAL: No apparent distress.  Nontoxic. HEENT: MMM.  Legally blind.  Hearing grossly intact. NECK: Supple.  No apparent JVD.  RESP: 98% on 2 L.  No IWOB.  Fair aeration  bilaterally. CVS:  RRR. Heart sounds normal.  ABD/GI/GU: BS+. Abd soft, NTND.  MSK/EXT:  Moves extremities.  2+ edema in BLE.  Scrotal and penile edema SKIN: no apparent skin lesion or wound NEURO: Awake, alert and oriented x4 except to date and months.  No apparent focal neuro deficit. PSYCH: Calm. Normal affect.  Procedures:  None  Microbiology summarized: 4/28-influenza PCR and COVID-19 PCR negative. 4/28-C. difficile negative. 4/28-GI panel negative. 5/12-blood and urine cultures ordered.  Assessment & Plan: Acute respiratory failure with hypoxia due to acute on chronic diastolic CHF/fluid overload Acute on chronic diastolic CHF/fluid overload in the setting of renal failure AKI on CKD-4 progressing to ESRD/oliguria Hypervolemic hyponatremia due to CHF and renal failure Uremia/anion gap metabolic acidosis due to renal failure Bone mineral disorder Anemia of renal disease-H&H stable after 1 unit -No response to high-dose diuretics.  -Plan for HD per nephrology.  IR to place [facility] today. VVS to place permanent access on 5/14 -Scrotal sling for scrotal edema -IV iron and Aranesp per nephrology  Acute metabolic encephalopathy: Uremia?  BUN 111.  Ammonia 43.  CT head without acute finding.  CT abdomen consistent with fluid overload versus infectious process. TSH and cortisol previously normal.  No apparent focal neuro deficits.  UA concerning for UTI but obtained from existing Foley.  He also has mild leukocytosis and brief hypothermia.  Blood cultures  negative.  Overall, encephalopathy resolved.  He is oriented x4 except date of month. -Hold off antibiotics for now -Uremia per nephrology. -Continue holding gabapentin  Acute urine retention: Indwelling Foley catheter placed here. -Continue indwelling Foley for now given significant penile edema.  Hypothermia: Resolved. -Work-up as above.   Controlled DM-2: A1c 6.4 09/19/2019 Recent Labs    10/27/19 2116 10/28/19 0259  10/28/19 0615  GLUCAP 123* 99 101*  -Continue SSI and Lantus 5 units daily  Essential hypertension: Normotensive -Hold morning meds  Hyperlipidemia: continue statin.  Diarrhea: Resolved.  Generalized weakness/Debility:  -PT/OT eval  Goal of care: Significant comorbidities as above.  Legally blind.  Full code. -Palliative care consulted.          DVT prophylaxis: Subcu heparin Code Status: Full code Family Communication: Updated [name], patient's cousin listed as patient's contact on 5/12 Status is: Inpatient  Remains inpatient appropriate because: Fluid overload/CHF exacerbation/AKI on CKD-4/metabolic encephalopathy  Dispo: The patient is from: Home              Anticipated d/c is to: To be determined              Anticipated d/c date is: > 3 days              Patient currently is not medically stable to d/c.        Consultants:  Nephrology IR VVS PMT   Sch Meds:  Scheduled Meds: . aspirin EC  81 mg Oral Daily  . bisacodyl  10 mg Rectal Once  . brimonidine  1 drop Left Eye BID  . calcium acetate  1,334 mg Oral TID WC  . Chlorhexidine Gluconate Cloth  6 each Topical Daily  . Chlorhexidine Gluconate Cloth  6 each Topical Q0600  . darbepoetin (ARANESP) injection - NON-DIALYSIS  100 mcg Subcutaneous Q Mon-1800  . dorzolamide-timolol  1 drop Left Eye BID  . fentaNYL      . gatifloxacin  1 drop Left Eye QID  . gelatin adsorbable      . heparin sodium (porcine)      . hydrALAZINE  25 mg Oral TID  . insulin aspart  0-9 Units Subcutaneous TID WC  . latanoprost  1 drop Left Eye QHS  . lidocaine      . midazolam      . senna-docusate  1 tablet Oral BID  . sodium bicarbonate  650 mg Oral Daily   Continuous Infusions: . sodium chloride 250 mL (10/16/19 0909)  . ceFAZolin    .  ceFAZolin (ANCEF) IV    . [START ON 10/29/2019] cefUROXime (ZINACEF)  IV     PRN Meds:.sodium chloride, acetaminophen **OR** acetaminophen  Antimicrobials: Anti-infectives (From  admission, onward)   Start     Dose/Rate Route Frequency Ordered Stop   10/29/19 0600  cefUROXime (ZINACEF) 1.5 g in sodium chloride 0.9 % 100 mL IVPB     1.5 g 200 mL/hr over 30 Minutes Intravenous On call to O.R. 10/28/19 0727 10/30/19 0559   10/28/19 0839  ceFAZolin (ANCEF) IVPB 2g/100 mL premix     over 30 Minutes  Continuous PRN 10/28/19 0846 10/28/19 0909   10/28/19 0826  ceFAZolin (ANCEF) 2-4 GM/100ML-% IVPB    Note to Pharmacy: Domenick Bookbinder   : cabinet override      10/28/19 0826 10/28/19 2044   10/28/19 0800  ceFAZolin (ANCEF) IVPB 2g/100 mL premix     2 g 200 mL/hr over 30 Minutes Intravenous To Radiology 10/28/19 0223  10/29/19 0800   10/28/19 0000  ceFAZolin (ANCEF) IVPB 2g/100 mL premix  Status:  Discontinued     2 g 200 mL/hr over 30 Minutes Intravenous To Radiology 10/27/19 1333 10/28/19 0223       I have personally reviewed the following labs and images: CBC: Recent Labs  Lab 10/24/19 0419 10/25/19 0532 10/26/19 0611 10/27/19 0719 10/28/19 0753  WBC 6.4 6.6 5.2 10.3 12.0*  NEUTROABS 4.5 4.7 3.6 8.5* 9.6*  HGB 8.1* 8.3* 8.4* 8.5* 8.3*  HCT 26.2* 26.9* 26.9* 27.1* 25.2*  MCV 91.0 92.1 91.5 90.6 88.4  PLT 190 206 215 213 222   BMP &GFR Recent Labs  Lab 10/24/19 0419 10/25/19 0751 10/26/19 0611 10/27/19 0719 10/28/19 0753  NA 133* 133* 131* 130* 132*  K 3.7 3.9 3.8 3.9 3.9  CL 98 96* 94* 94* 93*  CO2 22 21* 20* 20* 20*  GLUCOSE 155* 148* 209* 184* 102*  BUN 84* 98* 101* 105* 111*  CREATININE 4.73* 5.26* 5.84* 6.49* 7.00*  CALCIUM 8.6* 8.7* 8.6* 8.9 8.7*  PHOS 8.0* 9.0* 9.5* 9.1* 9.1*   Estimated Creatinine Clearance: 12.7 mL/min (A) (by C-G formula based on SCr of 7 mg/dL (H)). Liver & Pancreas: Recent Labs  Lab 10/24/19 0419 10/25/19 0751 10/26/19 0611 10/27/19 0719 10/28/19 0753  AST  --  52*  --   --   --   ALT  --  135*  --   --   --   ALKPHOS  --  241*  --   --   --   BILITOT  --  1.1  --   --   --   PROT  --  6.4*  --   --   --     ALBUMIN 2.4* 2.5* 2.4* 2.6* 2.4*   No results for input(s): LIPASE, AMYLASE in the last 168 hours. Recent Labs  Lab 10/28/19 0753  AMMONIA 43*   Diabetic: No results for input(s): HGBA1C in the last 72 hours. Recent Labs  Lab 10/27/19 1110 10/27/19 1556 10/27/19 2116 10/28/19 0259 10/28/19 0615  GLUCAP 210* 171* 123* 99 101*   Cardiac Enzymes: No results for input(s): CKTOTAL, CKMB, CKMBINDEX, TROPONINI in the last 168 hours. No results for input(s): PROBNP in the last 8760 hours. Coagulation Profile: No results for input(s): INR, PROTIME in the last 168 hours. Thyroid Function Tests: No results for input(s): TSH, T4TOTAL, FREET4, T3FREE, THYROIDAB in the last 72 hours. Lipid Profile: No results for input(s): CHOL, HDL, LDLCALC, TRIG, CHOLHDL, LDLDIRECT in the last 72 hours. Anemia Panel: No results for input(s): VITAMINB12, FOLATE, FERRITIN, TIBC, IRON, RETICCTPCT in the last 72 hours. Urine analysis:    Component Value Date/Time   COLORURINE AMBER (A) 10/27/2019 1559   APPEARANCEUR TURBID (A) 10/27/2019 1559   LABSPEC 1.014 10/27/2019 1559   PHURINE 5.0 10/27/2019 1559   GLUCOSEU 50 (A) 10/27/2019 1559   HGBUR LARGE (A) 10/27/2019 1559   BILIRUBINUR NEGATIVE 10/27/2019 1559   KETONESUR NEGATIVE 10/27/2019 1559   PROTEINUR 100 (A) 10/27/2019 1559   UROBILINOGEN 0.2 12/13/2014 2056   NITRITE NEGATIVE 10/27/2019 1559   LEUKOCYTESUR LARGE (A) 10/27/2019 1559   Sepsis Labs: Invalid input(s): PROCALCITONIN, [place]  Microbiology: Recent Results (from the past 240 hour(s))  Culture, blood (routine x 2)     Status: None (Preliminary result)   Collection Time: 10/27/19 12:08 PM   Specimen: BLOOD RIGHT HAND  Result Value Ref Range Status   Specimen Description BLOOD RIGHT HAND  Final  Special Requests   Final    BOTTLES DRAWN AEROBIC AND ANAEROBIC Blood Culture adequate volume   Culture   Final    NO GROWTH < 24 HOURS Performed at [facility],  [place] [address], [place], [place] [address]    Report Status PENDING  Incomplete  Culture, blood (routine x 2)     Status: None (Preliminary result)   Collection Time: 10/27/19 12:08 PM   Specimen: BLOOD RIGHT HAND  Result Value Ref Range Status   Specimen Description BLOOD RIGHT HAND  Final   Special Requests   Final    BOTTLES DRAWN AEROBIC AND ANAEROBIC Blood Culture adequate volume   Culture   Final    NO GROWTH < 24 HOURS Performed at [facility], [place] [address] [place], [place] [address]    Report Status PENDING  Incomplete    Radiology Studies: CT ABDOMEN PELVIS WO CONTRAST  Result Date: 10/27/2019 CLINICAL DATA:  Shortness of breath and diarrhea. EXAM: CT ABDOMEN AND PELVIS WITHOUT CONTRAST TECHNIQUE: Multidetector CT imaging of the abdomen and pelvis was performed following the standard protocol without IV contrast. COMPARISON:  October 07, 2013 FINDINGS: Lower chest: Moderate severity areas of consolidation are seen within the bilateral lung bases. Small to moderate size bilateral pleural effusions are also seen. Hepatobiliary: No focal liver abnormality is seen. A mild amount of perihepatic fluid is seen. No gallstones, gallbladder wall thickening, or biliary dilatation. Pancreas: Unremarkable. No pancreatic ductal dilatation or surrounding inflammatory changes. Spleen: Normal in size without focal abnormality. Adrenals/Urinary Tract: Adrenal glands are unremarkable. Kidneys are normal, without renal calculi, focal lesion, or hydronephrosis. A Foley catheter is seen within the urinary bladder. Stomach/Bowel: Stomach is within normal limits. The appendix is not clearly visualized no evidence of bowel wall thickening, distention, or inflammatory changes. Vascular/Lymphatic: There is mild calcification of the abdominal aorta. No enlarged abdominal or pelvic lymph nodes. Reproductive: Prostate is unremarkable. Other: There is a mild amount of abdominopelvic free fluid. Musculoskeletal:  Marked severity edema and subcutaneous inflammatory fat stranding seen throughout the abdominal and pelvic walls. No acute osseous abnormalities are seen. IMPRESSION: 1. Moderate severity bibasilar consolidation and small to moderate size bilateral pleural effusions. 2. Marked severity edema and subcutaneous inflammatory fat stranding throughout the abdominal and pelvic walls. 3. Mild amount of abdominopelvic free fluid. Aortic Atherosclerosis (ICD10-I70.0). Electronically Signed   By: Virgina Norfolk M.D.   On: 10/27/2019 19:18   CT HEAD WO CONTRAST  Result Date: 10/27/2019 CLINICAL DATA:  Altered mental status, fatigue EXAM: CT HEAD WITHOUT CONTRAST TECHNIQUE: Contiguous axial images were obtained from the base of the skull through the vertex without intravenous contrast. COMPARISON:  CT 06/15/2019, MRI 06/16/2019 FINDINGS: Brain: Stable sequela of remote left basal ganglia infarct. No evidence of acute infarction, hemorrhage, hydrocephalus, extra-axial collection or mass lesion/mass effect. Symmetric prominence of the ventricles, cisterns and sulci compatible with parenchymal volume loss. Patchy areas of white matter hypoattenuation are most compatible with chronic microvascular angiopathy. Chronic dural calcifications are similar to priors. Vascular: Atherosclerotic calcification of the carotid siphons. No hyperdense vessel. Skull: No calvarial fracture or suspicious osseous lesion. No scalp swelling or hematoma. Sinuses/Orbits: Mild nodular mural thickening in the maxillary sinuses with a small layering air-fluid level in the left sphenoid sinus. There is rightward nasal septal deviation with a right-sided nasal septal spur. Other: Extensive periodontal disease with multiple carious lesions and periapical lucencies. IMPRESSION: 1. No acute intracranial abnormality. 2. Stable sequela of remote left basal  ganglia infarct. 3. Stable parenchymal volume loss and chronic microvascular angiopathy. 4. Small  layering air-fluid level in the left sphenoid sinus, could assess for clinical symptoms of acute sinusitis. 5. Periodontal disease, could correlate with outpatient dental examination Electronically Signed   By: Lovena Le M.D.   On: 10/27/2019 19:16    Heywood Tokunaga T. [place]  If 7PM-7AM, please contact night-coverage [email or web address] Password [facility] 10/28/2019, 10:41 AM

## 2019-10-28 NOTE — Progress Notes (Signed)
Physical Therapy Treatment Patient Details Name: Christopher Davila MRN: [id number] DOB: 1970/12/24 Today's Date: 10/28/2019    History of Present Illness Pt is an 49 y.o. male with PMH DM, HTN, CKD stage IIIb, peripheral neuropathy, diabetic retinopathy, bilateral vision loss, glaucoma, chronic diastolic heart failure who presented to ED with increased weakness.    PT Comments    Pt was seen for mobility on stedy to stand and work on control of LE's after OT fabricated a scrotal sling and PT with OT applied to pt for standing support.  Pt is more comfortable with sling, and was more comfortable in bed after returning there.  Requires two person assist to get OOB due to scrotal pain, but is tolerating standing  Better with stedy and sitting on prop on device.  Follow acutely for increasing standing endurance, to increase sitting and standing balance and to promote better control of standing posture.  Follow Up Recommendations  Supervision for mobility/OOB;SNF     Equipment Recommendations  None recommended by PT(defer to dc venue)    Recommendations for Other Services       Precautions / Restrictions Precautions Precautions: Fall Precaution Comments: blind; scrotal swelling Required Braces or Orthoses: Other Brace Other Brace: scrotal sling Restrictions Weight Bearing Restrictions: No    Mobility  Bed Mobility Overal bed mobility: Needs Assistance Bed Mobility: Supine to Sit;Sit to Supine     Supine to sit: Mod assist;Max assist;+2 for physical assistance;+2 for safety/equipment Sit to supine: Mod assist;Max assist;+2 for physical assistance;+2 for safety/equipment      Transfers Overall transfer level: Needs assistance Equipment used: (stedy) Transfers: Sit to/from Stand Sit to Stand: Max assist;+2 physical assistance;+2 safety/equipment         General transfer comment: PT assisted standing anteriorly and OT assisted posteriorly to get into stedy  Ambulation/Gait             General Gait Details: unable   Stairs             Wheelchair Mobility    Modified Rankin (Stroke Patients Only)       Balance Overall balance assessment: Needs assistance Sitting-balance support: Feet supported Sitting balance-Leahy Scale: Poor     Standing balance support: Bilateral upper extremity supported Standing balance-Leahy Scale: Zero                              Cognition Arousal/Alertness: Awake/alert Behavior During Therapy: Flat affect Overall Cognitive Status: Impaired/Different from baseline Area of Impairment: Awareness;Following commands;Safety/judgement                     Memory: Decreased short-term memory Following Commands: Follows one step commands inconsistently;Follows one step commands with increased time Safety/Judgement: Decreased awareness of safety;Decreased awareness of deficits Awareness: Intellectual Problem Solving: Slow processing;Requires verbal cues;Requires tactile cues General Comments: tactile cues are most effective for eliciting accurate movement likely due to visual impairment      Exercises      General Comments General comments (skin integrity, edema, etc.): 3L O2      Pertinent Vitals/Pain Pain Assessment: Faces Faces Pain Scale: Hurts little more Pain Location: scrotum, penis Pain Descriptors / Indicators: Grimacing Pain Intervention(s): Limited activity within patient's tolerance;Monitored during session;Repositioned    Home Living                      Prior Function  PT Goals (current goals can now be found in the care plan section) Acute Rehab PT Goals Patient Stated Goal: feel better    Frequency    Min 3X/week      PT Plan Discharge plan needs to be updated    Co-evaluation PT/OT/SLP Co-Evaluation/Treatment: Yes Reason for Co-Treatment: Complexity of the patient's impairments (multi-system involvement);For patient/therapist safety;To  address functional/ADL transfers PT goals addressed during session: Mobility/safety with mobility;Proper use of DME        AM-PAC PT "6 Clicks" Mobility   Outcome Measure  Help needed turning from your back to your side while in a flat bed without using bedrails?: A Lot Help needed moving from lying on your back to sitting on the side of a flat bed without using bedrails?: A Lot Help needed moving to and from a bed to a chair (including a wheelchair)?: A Lot Help needed standing up from a chair using your arms (e.g., wheelchair or bedside chair)?: Total Help needed to walk in hospital room?: Total Help needed climbing 3-5 steps with a railing? : Total 6 Click Score: 9    End of Session Equipment Utilized During Treatment: Gait belt;Oxygen Activity Tolerance: Patient tolerated treatment well;Treatment limited secondary to medical complications (Comment) Patient left: in bed;with call bell/phone within reach;with bed alarm set Nurse Communication: Mobility status PT Visit Diagnosis: Unsteadiness on feet (R26.81);Muscle weakness (generalized) (M62.81);History of falling (Z91.81)     Time: 0601-5615 PT Time Calculation (min) (ACUTE ONLY): 31 min  Charges:  $Therapeutic Activity: 8-22 mins          Ramond Dial 10/28/2019, 10:09 PM   Mee Hives, PT MS Acute Rehab Dept. Number: [place] [facility] [place]

## 2019-10-29 ENCOUNTER — Inpatient Hospital Stay (HOSPITAL_COMMUNITY): Payer: Medicaid Other | Admitting: Registered Nurse

## 2019-10-29 ENCOUNTER — Encounter (HOSPITAL_COMMUNITY)
Admission: EM | Disposition: A | Discharge status: Skilled Nursing Facility | Payer: Self-pay | Source: Home / Self Care | Attending: Student

## 2019-10-29 DIAGNOSIS — Z515 Encounter for palliative care: Secondary | ICD-10-CM

## 2019-10-29 DIAGNOSIS — Z992 Dependence on renal dialysis: Secondary | ICD-10-CM

## 2019-10-29 DIAGNOSIS — Z7189 Other specified counseling: Secondary | ICD-10-CM

## 2019-10-29 DIAGNOSIS — I5030 Unspecified diastolic (congestive) heart failure: Secondary | ICD-10-CM

## 2019-10-29 DIAGNOSIS — N186 End stage renal disease: Secondary | ICD-10-CM

## 2019-10-29 HISTORY — PX: AV FISTULA PLACEMENT: SHX1204

## 2019-10-29 LAB — SURGICAL PCR SCREEN
MRSA, PCR: POSITIVE — AB
Staphylococcus aureus: POSITIVE — AB

## 2019-10-29 LAB — RENAL FUNCTION PANEL
Albumin: 2.3 g/dL — ABNORMAL LOW (ref 3.5–5.0)
Albumin: 2.2 g/dL — ABNORMAL LOW (ref 3.5–5.0)
Anion gap: 18 — ABNORMAL HIGH (ref 5–15)
Anion gap: 14 (ref 5–15)
BUN: 120 mg/dL — ABNORMAL HIGH (ref 6–20)
BUN: 86 mg/dL — ABNORMAL HIGH (ref 6–20)
CO2: 22 mmol/L (ref 22–32)
CO2: 22 mmol/L (ref 22–32)
Calcium: 8.5 mg/dL — ABNORMAL LOW (ref 8.9–10.3)
Calcium: 8.4 mg/dL — ABNORMAL LOW (ref 8.9–10.3)
Chloride: 92 mmol/L — ABNORMAL LOW (ref 98–111)
Chloride: 97 mmol/L — ABNORMAL LOW (ref 98–111)
Creatinine, Ser: 7.25 mg/dL — ABNORMAL HIGH (ref 0.61–1.24)
Creatinine, Ser: 5.69 mg/dL — ABNORMAL HIGH (ref 0.61–1.24)
GFR calc Af Amer: 9 mL/min — ABNORMAL LOW (ref >60)
GFR calc Af Amer: 13 mL/min — ABNORMAL LOW (ref >60)
GFR calc non Af Amer: 8 mL/min — ABNORMAL LOW (ref >60)
GFR calc non Af Amer: 11 mL/min — ABNORMAL LOW (ref >60)
Glucose, Bld: 158 mg/dL — ABNORMAL HIGH (ref 70–99)
Glucose, Bld: 137 mg/dL — ABNORMAL HIGH (ref 70–99)
Phosphorus: 9.8 mg/dL — ABNORMAL HIGH (ref 2.5–4.6)
Phosphorus: 7.2 mg/dL — ABNORMAL HIGH (ref 2.5–4.6)
Potassium: 4 mmol/L (ref 3.5–5.1)
Potassium: 3.7 mmol/L (ref 3.5–5.1)
Sodium: 132 mmol/L — ABNORMAL LOW (ref 135–145)
Sodium: 133 mmol/L — ABNORMAL LOW (ref 135–145)

## 2019-10-29 LAB — CBC
HCT: 25 % — ABNORMAL LOW (ref 39.0–52.0)
Hemoglobin: 8.1 g/dL — ABNORMAL LOW (ref 13.0–17.0)
MCH: 28.4 pg (ref 26.0–34.0)
MCHC: 32.4 g/dL (ref 30.0–36.0)
MCV: 87.7 fL (ref 80.0–100.0)
Platelets: 210 10*3/uL (ref 150–400)
RBC: 2.85 MIL/uL — ABNORMAL LOW (ref 4.22–5.81)
RDW: 17.2 % — ABNORMAL HIGH (ref 11.5–15.5)
WBC: 13 10*3/uL — ABNORMAL HIGH (ref 4.0–10.5)
nRBC: 0 % (ref 0.0–0.2)

## 2019-10-29 LAB — GLUCOSE, CAPILLARY
Glucose-Capillary: 127 mg/dL — ABNORMAL HIGH (ref 70–99)
Glucose-Capillary: 115 mg/dL — ABNORMAL HIGH (ref 70–99)
Glucose-Capillary: 130 mg/dL — ABNORMAL HIGH (ref 70–99)
Glucose-Capillary: 129 mg/dL — ABNORMAL HIGH (ref 70–99)
Glucose-Capillary: 194 mg/dL — ABNORMAL HIGH (ref 70–99)

## 2019-10-29 LAB — URINE CULTURE: Culture: >=100000 — AB

## 2019-10-29 SURGERY — ARTERIOVENOUS (AV) FISTULA CREATION
Anesthesia: General | Site: Arm Upper | Laterality: Right

## 2019-10-29 MED ORDER — ALBUMIN HUMAN 5 % IV SOLN: INTRAVENOUS | Status: DC | PRN

## 2019-10-29 MED ORDER — HEPARIN SODIUM (PORCINE) 1000 UNIT/ML DIALYSIS: 1000.0000 [IU] | INTRAMUSCULAR | Status: DC | PRN

## 2019-10-29 MED ORDER — PROPOFOL 10 MG/ML IV BOLUS
INTRAVENOUS | Status: DC | PRN
  Administered 2019-10-29: 50 mg via INTRAVENOUS
  Administered 2019-10-29: 150 mg via INTRAVENOUS

## 2019-10-29 MED ORDER — SODIUM CHLORIDE 0.9 % IV SOLN: 100.0000 mL | INTRAVENOUS | Status: DC | PRN

## 2019-10-29 MED ORDER — LIDOCAINE-EPINEPHRINE 0.5 %-1:200000 IJ SOLN
INTRAMUSCULAR | Status: AC
  Filled 2019-10-29: qty 1

## 2019-10-29 MED ORDER — LIDOCAINE 2% (20 MG/ML) 5 ML SYRINGE
INTRAMUSCULAR | Status: DC | PRN
  Administered 2019-10-29: 60 mg via INTRAVENOUS

## 2019-10-29 MED ORDER — ONDANSETRON HCL 4 MG/2ML IJ SOLN
INTRAMUSCULAR | Status: DC | PRN
  Administered 2019-10-29: 4 mg via INTRAVENOUS

## 2019-10-29 MED ORDER — SODIUM CHLORIDE 0.9 % IV SOLN: INTRAVENOUS | Status: DC

## 2019-10-29 MED ORDER — LIDOCAINE-EPINEPHRINE 0.5 %-1:200000 IJ SOLN: INTRAMUSCULAR | Status: DC | PRN

## 2019-10-29 MED ORDER — LIDOCAINE-PRILOCAINE 2.5-2.5 % EX CREA: 1.0000 "application " | TOPICAL_CREAM | CUTANEOUS | Status: DC | PRN

## 2019-10-29 MED ORDER — SODIUM CHLORIDE 0.9 % IV SOLN
INTRAVENOUS | Status: DC | PRN
  Administered 2019-10-29: 500 mL

## 2019-10-29 MED ORDER — MUPIROCIN 2 % EX OINT: 1.0000 "application " | TOPICAL_OINTMENT | Freq: Two times a day (BID) | CUTANEOUS | Status: DC

## 2019-10-29 MED ORDER — ANTICOAGULANT SODIUM CITRATE 4% (200MG/5ML) IV SOLN
5.0000 mL | Freq: Once | Status: AC
  Administered 2019-10-29: 5 mL
  Filled 2019-10-29 (×2): qty 5

## 2019-10-29 MED ORDER — SODIUM CHLORIDE 0.9 % IV SOLN
INTRAVENOUS | Status: AC
  Filled 2019-10-29: qty 1.2

## 2019-10-29 MED ORDER — MUPIROCIN 2 % EX OINT
1.0000 "application " | TOPICAL_OINTMENT | Freq: Two times a day (BID) | CUTANEOUS | Status: AC
  Administered 2019-10-30 – 2019-11-02 (×6): 1 via NASAL
  Filled 2019-10-29 (×4): qty 22

## 2019-10-29 MED ORDER — PENTAFLUOROPROP-TETRAFLUOROETH EX AERO: 1.0000 "application " | INHALATION_SPRAY | CUTANEOUS | Status: DC | PRN

## 2019-10-29 MED ORDER — MIDAZOLAM HCL 2 MG/2ML IJ SOLN
INTRAMUSCULAR | Status: AC
  Filled 2019-10-29: qty 2

## 2019-10-29 MED ORDER — VASOPRESSIN 20 UNIT/ML IV SOLN
INTRAVENOUS | Status: DC | PRN
Start: 2019-10-29 — End: 2019-10-29
  Administered 2019-10-29: 2 [IU] via INTRAVENOUS
  Administered 2019-10-29 (×2): 1 [IU] via INTRAVENOUS
  Administered 2019-10-29 (×3): 2 [IU] via INTRAVENOUS

## 2019-10-29 MED ORDER — LIDOCAINE HCL (PF) 1 % IJ SOLN: 5.0000 mL | INTRAMUSCULAR | Status: DC | PRN

## 2019-10-29 MED ORDER — PHENYLEPHRINE HCL-NACL 10-0.9 MG/250ML-% IV SOLN
INTRAVENOUS | Status: DC | PRN
  Administered 2019-10-29: 30 ug/min via INTRAVENOUS
  Administered 2019-10-29: 50 ug/min via INTRAVENOUS

## 2019-10-29 MED ORDER — PHENYLEPHRINE 40 MCG/ML (10ML) SYRINGE FOR IV PUSH (FOR BLOOD PRESSURE SUPPORT)
PREFILLED_SYRINGE | INTRAVENOUS | Status: DC | PRN
  Administered 2019-10-29 (×3): 120 ug via INTRAVENOUS
  Administered 2019-10-29: 40 ug via INTRAVENOUS

## 2019-10-29 MED ORDER — FENTANYL CITRATE (PF) 250 MCG/5ML IJ SOLN
INTRAMUSCULAR | Status: AC
  Filled 2019-10-29: qty 5

## 2019-10-29 MED ORDER — EPHEDRINE SULFATE-NACL 50-0.9 MG/10ML-% IV SOSY
PREFILLED_SYRINGE | INTRAVENOUS | Status: DC | PRN
  Administered 2019-10-29: 5 mg via INTRAVENOUS
  Administered 2019-10-29: 10 mg via INTRAVENOUS
  Administered 2019-10-29: 15 mg via INTRAVENOUS
  Administered 2019-10-29 (×2): 10 mg via INTRAVENOUS

## 2019-10-29 MED ORDER — 0.9 % SODIUM CHLORIDE (POUR BTL) OPTIME
TOPICAL | Status: DC | PRN
  Administered 2019-10-29: 1000 mL

## 2019-10-29 MED ORDER — ALTEPLASE 2 MG IJ SOLR: 2.0000 mg | Freq: Once | INTRAMUSCULAR | Status: DC | PRN

## 2019-10-29 SURGICAL SUPPLY — 30 items
ARMBAND PINK RESTRICT EXTREMIT (MISCELLANEOUS) ×4 IMPLANT
CANISTER SUCT 3000ML PPV (MISCELLANEOUS) ×3 IMPLANT
CANNULA VESSEL 3MM 2 BLNT TIP (CANNULA) ×3 IMPLANT
CLIP LIGATING EXTRA MED SLVR (CLIP) ×3 IMPLANT
CLIP LIGATING EXTRA SM BLUE (MISCELLANEOUS) ×3 IMPLANT
COVER PROBE W GEL 5X96 (DRAPES) ×3 IMPLANT
COVER WAND RF STERILE (DRAPES) ×1 IMPLANT
DECANTER SPIKE VIAL GLASS SM (MISCELLANEOUS) ×3 IMPLANT
DERMABOND ADVANCED (GAUZE/BANDAGES/DRESSINGS) ×2
DERMABOND ADVANCED .7 DNX12 (GAUZE/BANDAGES/DRESSINGS) ×1 IMPLANT
ELECT REM PT RETURN 9FT ADLT (ELECTROSURGICAL) ×3
ELECTRODE REM PT RTRN 9FT ADLT (ELECTROSURGICAL) ×1 IMPLANT
GLOVE BIOGEL PI IND STRL 6.5 (GLOVE) IMPLANT
GLOVE BIOGEL PI INDICATOR 6.5 (GLOVE) ×2
GLOVE SS BIOGEL STRL SZ 7.5 (GLOVE) ×1 IMPLANT
GLOVE SUPERSENSE BIOGEL SZ 7.5 (GLOVE) ×2
GLOVE SURG SS PI 8.0 STRL IVOR (GLOVE) ×2 IMPLANT
GOWN STRL REUS W/ TWL LRG LVL3 (GOWN DISPOSABLE) ×3 IMPLANT
GOWN STRL REUS W/TWL LRG LVL3 (GOWN DISPOSABLE) ×9
KIT BASIN OR (CUSTOM PROCEDURE TRAY) ×3 IMPLANT
KIT TURNOVER KIT B (KITS) ×3 IMPLANT
NS IRRIG 1000ML POUR BTL (IV SOLUTION) ×3 IMPLANT
PACK CV ACCESS (CUSTOM PROCEDURE TRAY) ×3 IMPLANT
PAD ARMBOARD 7.5X6 YLW CONV (MISCELLANEOUS) ×6 IMPLANT
SUT PROLENE 6 0 CC (SUTURE) ×3 IMPLANT
SUT VIC AB 3-0 SH 27 (SUTURE) ×3
SUT VIC AB 3-0 SH 27X BRD (SUTURE) ×1 IMPLANT
TOWEL GREEN STERILE (TOWEL DISPOSABLE) ×3 IMPLANT
UNDERPAD 30X36 HEAVY ABSORB (UNDERPADS AND DIAPERS) ×3 IMPLANT
WATER STERILE IRR 1000ML POUR (IV SOLUTION) ×3 IMPLANT

## 2019-10-29 NOTE — Anesthesia Procedure Notes (Signed)
Procedure Name: LMA Insertion Date/Time: 10/29/2019 12:20 PM Performed by: Trinna Post., CRNA Pre-anesthesia Checklist: Patient identified, Emergency Drugs available, Suction available, Patient being monitored and Timeout performed Patient Re-evaluated:Patient Re-evaluated prior to induction Oxygen Delivery Method: Circle system utilized Preoxygenation: Pre-oxygenation with 100% oxygen Induction Type: IV induction Ventilation: Mask ventilation without difficulty LMA: LMA inserted LMA Size: 4.0 Number of attempts: 1 Placement Confirmation: positive ETCO2 and breath sounds checked- equal and bilateral Tube secured with: Tape Dental Injury: Teeth and Oropharynx as per pre-operative assessment

## 2019-10-29 NOTE — Interval H&P Note (Signed)
History and Physical Interval Note:  10/29/2019 11:21 AM  Christopher Davila  has presented today for surgery, with the diagnosis of END STAGE RENAL DISEASE.  The various methods of treatment have been discussed with the patient and family. After consideration of risks, benefits and other options for treatment, the patient has consented to  Procedure(s): RIGHT ARM ARTERIOVENOUS (AV) FISTULA CREATION (Right) as a surgical intervention.  The patient's history has been reviewed, patient examined, no change in status, stable for surgery.  I have reviewed the patient's chart and labs.  Questions were answered to the patient's satisfaction.     Curt Jews

## 2019-10-29 NOTE — Transfer of Care (Signed)
Immediate Anesthesia Transfer of Care Note  [name]  Procedure(s) Performed: RIGHT ARM BRACHIOBASILIC ARTERIOVENOUS (AV) FISTULA CREATION (Right Arm Upper)  Patient Location: PACU  Anesthesia Type:General  Level of Consciousness: drowsy  Airway & Oxygen Therapy: Patient Spontanous Breathing and Patient connected to nasal cannula oxygen  Post-op Assessment: Report given to RN and Post -op Vital signs reviewed and stable  Post vital signs: Reviewed and stable  Last Vitals:  Vitals Value Taken Time  BP 166/83 10/29/19 1412  Temp    Pulse 63 10/29/19 1414  Resp 27 10/29/19 1414  SpO2 98 % 10/29/19 1414  Vitals shown include unvalidated device data.  Last Pain:  Vitals:   10/29/19 1138  TempSrc:   PainSc: 0-No pain      Patients Stated Pain Goal: 2 (74/93/55 2174)  Complications: No apparent anesthesia complications

## 2019-10-29 NOTE — Anesthesia Procedure Notes (Addendum)
Procedure Name: LMA Insertion Date/Time: 10/29/2019 12:42 PM Performed by: Trinna Post., CRNA Pre-anesthesia Checklist: Patient identified, Emergency Drugs available, Suction available, Patient being monitored and Timeout performed Patient Re-evaluated:Patient Re-evaluated prior to induction Oxygen Delivery Method: Circle system utilized Preoxygenation: Pre-oxygenation with 100% oxygen LMA: LMA inserted LMA Size: 5.0 Placement Confirmation: positive ETCO2 and breath sounds checked- equal and bilateral Tube secured with: Tape Dental Injury: Teeth and Oropharynx as per pre-operative assessment  Comments: Patient was not adequately ventilating with LMA 4, exchanged for LMA 5 with improvement in O2 exchange and SpO2

## 2019-10-29 NOTE — Progress Notes (Deleted)
   10/29/19 0331  Vitals  Temp 98 F (36.7 C)  Temp Source Oral  BP (!) 148/84  BP Location Right Arm  BP Method Automatic  Patient Position (if appropriate) Lying  Pulse Rate 72  Pulse Rate Source Monitor  Resp 18  Oxygen Therapy  SpO2 100 %  O2 Device Nasal Cannula  O2 Flow Rate (L/min) 2 L/min  Pain Assessment  Pain Scale 0-10  Pain Score 0  Height and Weight  Weight 84.2 kg  Type of Weight Post-Dialysis  BMI (Calculated) 32.89  MEWS Score  MEWS Temp 0  MEWS Systolic 0  MEWS Pulse 0  MEWS RR 0  MEWS LOC 0  MEWS Score 0  MEWS Score Color Green  Patient returned from dialysis. Resting in bed.

## 2019-10-29 NOTE — Progress Notes (Signed)
Patient already gone to surgery.  AD papers were left with Staff in PACU to give patient cousin [name]. No family were presence at time of my visit.  Chaplain available as needed.  Jaclynn Major, [place], [facility], Pager [phone]

## 2019-10-29 NOTE — Anesthesia Postprocedure Evaluation (Signed)
Anesthesia Post Note  [name]  Procedure(s) Performed: RIGHT ARM BRACHIOBASILIC ARTERIOVENOUS (AV) FISTULA CREATION (Right Arm Upper)     Patient location during evaluation: PACU Anesthesia Type: General Level of consciousness: awake Pain management: pain level controlled Vital Signs Assessment: post-procedure vital signs reviewed and stable Respiratory status: spontaneous breathing, nonlabored ventilation, respiratory function stable and patient connected to nasal cannula oxygen Cardiovascular status: blood pressure returned to baseline and stable Postop Assessment: no apparent nausea or vomiting Anesthetic complications: no    Last Vitals:  Vitals:   10/29/19 1542 10/29/19 1600  BP: 119/76   Pulse: 62   Resp:  (!) 22  Temp:    SpO2: (!) 89%     Last Pain:  Vitals:   10/29/19 1404  TempSrc:   PainSc: Asleep                 Jackey Housey P Latasha Buczkowski

## 2019-10-29 NOTE — Progress Notes (Signed)
   10/29/19 0412  Vitals  Temp (!) 97.5 F (36.4 C)  Temp Source Oral  BP 127/68  MAP (mmHg) 78  BP Location Left Arm  BP Method Automatic  Patient Position (if appropriate) Lying  Pulse Rate 72  Pulse Rate Source Monitor  Resp 20  Oxygen Therapy  SpO2 98 %  O2 Device Nasal Cannula  O2 Flow Rate (L/min) 2 L/min  MEWS Score  MEWS Temp 0  MEWS Systolic 0  MEWS Pulse 0  MEWS RR 0  MEWS LOC 0  MEWS Score 0  MEWS Score Color Green  Patient returned from dialysis. Resting in bed no complaints at this time.

## 2019-10-29 NOTE — Progress Notes (Signed)
Patient ID: Christopher Davila, male   DOB: [birth year], 49 y.o.   MRN: [id number]    S. Patient states he is tired and has nausea but no other complaints. Minimal UOP w/ max diuretics. Crt/BUN improved with dialysis this morning. Tolerated wit no issues, 2.5L UF. Eyes closed. Going to OR for AVF creation today and has TDC now on RIJ.     O:BP 127/68 (BP Location: Left Arm)   Pulse 72   Temp (!) 97.5 F (36.4 C) (Oral)   Resp 20   Ht 5\' 3"  (1.6 m)   Wt 80.7 kg   SpO2 98%   BMI 31.53 kg/m   Intake/Output Summary (Last 24 hours) at 10/29/2019 1034 Last data filed at 10/29/2019 0400 Gross per 24 hour  Intake 245 ml  Output 2150 ml  Net -1905 ml   Intake/Output: I/O last 3 completed shifts: In: 91 [P.O.:480; IV Piggyback:5] Out: 2250 [Urine:250; Other:2000]  Intake/Output this shift:  No intake/output data recorded. Weight change: 0.016 kg Gen: NAD, lying in bed, answers questions CVS: no rub, normal rate Resp: CTA BL, no iwob Abd: +BS, soft, NT/ND Ext: 2+ edema in BLE  Recent Labs  Lab 10/24/19 0419 10/25/19 0751 10/26/19 0611 10/27/19 0719 10/28/19 0753 10/29/19 0158 10/29/19 0609  NA 133* 133* 131* 130* 132* 132* 133*  K 3.7 3.9 3.8 3.9 3.9 4.0 3.7  CL 98 96* 94* 94* 93* 92* 97*  CO2 22 21* 20* 20* 20* 22 22  GLUCOSE 155* 148* 209* 184* 102* 158* 137*  BUN 84* 98* 101* 105* 111* 120* 86*  CREATININE 4.73* 5.26* 5.84* 6.49* 7.00* 7.25* 5.69*  ALBUMIN 2.4* 2.5* 2.4* 2.6* 2.4* 2.3* 2.2*  CALCIUM 8.6* 8.7* 8.6* 8.9 8.7* 8.5* 8.4*  PHOS 8.0* 9.0* 9.5* 9.1* 9.1* 9.8* 7.2*  AST  --  52*  --   --   --   --   --   ALT  --  135*  --   --   --   --   --    Liver Function Tests: Recent Labs  Lab 10/25/19 0751 10/26/19 0611 10/28/19 0753 10/29/19 0158 10/29/19 0609  AST 52*  --   --   --   --   ALT 135*  --   --   --   --   ALKPHOS 241*  --   --   --   --   BILITOT 1.1  --   --   --   --   PROT 6.4*  --   --   --   --   ALBUMIN 2.5*   < > 2.4* 2.3* 2.2*   < > = values  in this interval not displayed.   No results for input(s): LIPASE, AMYLASE in the last 168 hours. Recent Labs  Lab 10/28/19 0753  AMMONIA 43*   CBC: Recent Labs  Lab 10/25/19 0532 10/25/19 0532 10/26/19 7078 10/26/19 6754 10/27/19 0719 10/28/19 0753 10/29/19 0609  WBC 6.6   < > 5.2   < > 10.3 12.0* 13.0*  NEUTROABS 4.7   < > 3.6  --  8.5* 9.6*  --   HGB 8.3*   < > 8.4*   < > 8.5* 8.3* 8.1*  HCT 26.9*   < > 26.9*   < > 27.1* 25.2* 25.0*  MCV 92.1  --  91.5  --  90.6 88.4 87.7  PLT 206   < > 215   < > 213 222 210   < > =  values in this interval not displayed.   Cardiac Enzymes: No results for input(s): CKTOTAL, CKMB, CKMBINDEX, TROPONINI in the last 168 hours. CBG: Recent Labs  Lab 10/28/19 0615 10/28/19 1141 10/28/19 1633 10/28/19 2127 10/29/19 0611  GLUCAP 101* 101* 92 136* 127*    Iron Studies: No results for input(s): IRON, TIBC, TRANSFERRIN, FERRITIN in the last 72 hours. Studies/Results: CT ABDOMEN PELVIS WO CONTRAST  Result Date: 10/27/2019 CLINICAL DATA:  Shortness of breath and diarrhea. EXAM: CT ABDOMEN AND PELVIS WITHOUT CONTRAST TECHNIQUE: Multidetector CT imaging of the abdomen and pelvis was performed following the standard protocol without IV contrast. COMPARISON:  October 07, 2013 FINDINGS: Lower chest: Moderate severity areas of consolidation are seen within the bilateral lung bases. Small to moderate size bilateral pleural effusions are also seen. Hepatobiliary: No focal liver abnormality is seen. A mild amount of perihepatic fluid is seen. No gallstones, gallbladder wall thickening, or biliary dilatation. Pancreas: Unremarkable. No pancreatic ductal dilatation or surrounding inflammatory changes. Spleen: Normal in size without focal abnormality. Adrenals/Urinary Tract: Adrenal glands are unremarkable. Kidneys are normal, without renal calculi, focal lesion, or hydronephrosis. A Foley catheter is seen within the urinary bladder. Stomach/Bowel: Stomach is  within normal limits. The appendix is not clearly visualized no evidence of bowel wall thickening, distention, or inflammatory changes. Vascular/Lymphatic: There is mild calcification of the abdominal aorta. No enlarged abdominal or pelvic lymph nodes. Reproductive: Prostate is unremarkable. Other: There is a mild amount of abdominopelvic free fluid. Musculoskeletal: Marked severity edema and subcutaneous inflammatory fat stranding seen throughout the abdominal and pelvic walls. No acute osseous abnormalities are seen. IMPRESSION: 1. Moderate severity bibasilar consolidation and small to moderate size bilateral pleural effusions. 2. Marked severity edema and subcutaneous inflammatory fat stranding throughout the abdominal and pelvic walls. 3. Mild amount of abdominopelvic free fluid. Aortic Atherosclerosis (ICD10-I70.0). Electronically Signed   By: Virgina Norfolk M.D.   On: 10/27/2019 19:18   CT HEAD WO CONTRAST  Result Date: 10/27/2019 CLINICAL DATA:  Altered mental status, fatigue EXAM: CT HEAD WITHOUT CONTRAST TECHNIQUE: Contiguous axial images were obtained from the base of the skull through the vertex without intravenous contrast. COMPARISON:  CT 06/15/2019, MRI 06/16/2019 FINDINGS: Brain: Stable sequela of remote left basal ganglia infarct. No evidence of acute infarction, hemorrhage, hydrocephalus, extra-axial collection or mass lesion/mass effect. Symmetric prominence of the ventricles, cisterns and sulci compatible with parenchymal volume loss. Patchy areas of white matter hypoattenuation are most compatible with chronic microvascular angiopathy. Chronic dural calcifications are similar to priors. Vascular: Atherosclerotic calcification of the carotid siphons. No hyperdense vessel. Skull: No calvarial fracture or suspicious osseous lesion. No scalp swelling or hematoma. Sinuses/Orbits: Mild nodular mural thickening in the maxillary sinuses with a small layering air-fluid level in the left sphenoid  sinus. There is rightward nasal septal deviation with a right-sided nasal septal spur. Other: Extensive periodontal disease with multiple carious lesions and periapical lucencies. IMPRESSION: 1. No acute intracranial abnormality. 2. Stable sequela of remote left basal ganglia infarct. 3. Stable parenchymal volume loss and chronic microvascular angiopathy. 4. Small layering air-fluid level in the left sphenoid sinus, could assess for clinical symptoms of acute sinusitis. 5. Periodontal disease, could correlate with outpatient dental examination Electronically Signed   By: Lovena Le M.D.   On: 10/27/2019 19:16   . aspirin EC  81 mg Oral Daily  . bisacodyl  10 mg Rectal Once  . brimonidine  1 drop Left Eye BID  . calcium acetate  1,334 mg Oral TID WC  .  Chlorhexidine Gluconate Cloth  6 each Topical Daily  . Chlorhexidine Gluconate Cloth  6 each Topical Q0600  . darbepoetin (ARANESP) injection - NON-DIALYSIS  100 mcg Subcutaneous Q Mon-1800  . dorzolamide-timolol  1 drop Left Eye BID  . gatifloxacin  1 drop Left Eye QID  . hydrALAZINE  25 mg Oral TID  . insulin aspart  0-9 Units Subcutaneous TID WC  . latanoprost  1 drop Left Eye QHS  . mupirocin ointment  1 application Nasal BID  . senna-docusate  1 tablet Oral BID  . sodium bicarbonate  650 mg Oral Daily    BMET    Component Value Date/Time   NA 133 (L) 10/29/2019 0609   K 3.7 10/29/2019 0609   CL 97 (L) 10/29/2019 0609   CO2 22 10/29/2019 0609   GLUCOSE 137 (H) 10/29/2019 0609   BUN 86 (H) 10/29/2019 0609   CREATININE 5.69 (H) 10/29/2019 0609   CALCIUM 8.4 (L) 10/29/2019 0609   GFRNONAA 11 (L) 10/29/2019 0609   GFRAA 13 (L) 10/29/2019 0609   CBC    Component Value Date/Time   WBC 13.0 (H) 10/29/2019 0609   RBC 2.85 (L) 10/29/2019 0609   HGB 8.1 (L) 10/29/2019 0609   HCT 25.0 (L) 10/29/2019 0609   PLT 210 10/29/2019 0609   MCV 87.7 10/29/2019 0609   MCH 28.4 10/29/2019 0609   MCHC 32.4 10/29/2019 0609   RDW 17.2 (H)  10/29/2019 0609   LYMPHSABS 0.9 10/28/2019 0753   MONOABS 1.4 (H) 10/28/2019 0753   EOSABS 0.0 10/28/2019 0753   BASOSABS 0.0 10/28/2019 0753    Assessment/Plan:  1. AKI/CKD stage IV- in setting of acute on chronic diastolic CHF and diuresis. His baselineis3-3.5 and is followed by Dr. Carolin Sicks at [facility]. BUN/Crt climbed and volume status worsened despite diuresis. Started dialysis and tolerating well. Will plan for second treatment tomorrow. Difficulties with insight and palliative care is following. Next dialysis on Saturday  2. Dialysis Access: [facility] on 5/13. AVF creation today. Will CTM 3. Acute on chronic diastolic CHF-not responding to high dose diuretics- Uf with dialysis 4. Urinary retention- s/p foley catheter and mayneed to be discharged with this. Given minimal UOP could consider removal prior to DC. 5. Anemia of CKD stage IV- hgbdropped to 6.8 and received a unit of PRBC's at that time.Received 2 doses IV feraheme on 5/1 and Aranesp 40 mcg on 4/30. ESA increased to 178mcg on 5/10. Will CTM. Hgb stable but not optimal 6. DM type 2 with hyperglycemia - per primary svc 7. Bones-  Hyperphosphatemia: persistent but improved today. Continue to encourage compliance with Phoslo- PTH 172 8. Acidosis: On bicarb 650mg  daily. Now on HD and labs acceptable so will stop. 9. Hyponatremia-  Hypervolemic hyponatremia-  Will improve as UF is removed on Dialysis    [place] [phone]

## 2019-10-29 NOTE — Op Note (Signed)
    OPERATIVE REPORT  DATE OF SURGERY: 10/29/2019  [name], 49 y.o. male MRN: [id number]  DOB: February 12, 1971  PRE-OPERATIVE DIAGNOSIS: End-stage renal disease  POST-OPERATIVE DIAGNOSIS:  Same  PROCEDURE: Right brachiobasilic fistula  SURGEON:  Curt Jews, M.D.  PHYSICIAN ASSISTANT: Laurence Slate, PA-C  ANESTHESIA: LMA  EBL: per anesthesia record  Total I/O In: 100 [IV Piggyback:100] Out: -   BLOOD ADMINISTERED: none  DRAINS: none  SPECIMEN: none  COUNTS CORRECT:  YES  PATIENT DISPOSITION:  PACU - hemodynamically stable  PROCEDURE DETAILS: Patient was taken to room placed respiratory of the right arm prepped draped you sterile fashion.  SonoSite ultrasound was used to visualize the cephalic and basilic vein.  The patient had a IV in the antecubital space into the cephalic vein and there was some scarring there.  Incision was made over the antecubital space and carried down to isolate the cephalic vein and the brachial artery.  The artery was of normal caliber.  Cephalic vein did have scarring.  Tributary branches were ligated and divided and the vein was ligated distally.  The vein was scarred above the antecubital space and felt to be not adequate for fistula creation.  The basilic vein was identified through the same incision.  It did have branches at the level of the antecubital space and was larger more approximately.  The largest of these branches was chosen and was ligated distally and divided.  Tributary branches were ligated with 3-0 and 4-0 silk ties.  The vein was gently dilated and was felt to be adequate for fistula attempt.  The brachial artery was occluded proximally distally and was opened with an 11 blade and sent ulcerating with Potts scissors.  The vein was cut to the appropriate length and was spatulated and sewn end-to-side to the artery with a running 6-0 Prolene suture.  Clamps removed and good thrill was noted.  The patient did have palpable radial  pulse which was augmented by compression of the fistula.  Wound irrigated with saline.  Hemostasis electrocautery.  Wounds were closed with 3-0 Vicryl in the subcutaneous and subcuticular tissue.  Sterile dressing was applied and the patient was transferred to the recovery room in stable condition   Rosetta Posner, M.D., [facility] 10/29/2019 1:40 PM

## 2019-10-29 NOTE — Anesthesia Preprocedure Evaluation (Addendum)
Anesthesia Evaluation  Patient identified by MRN, date of birth, ID bandGeneral Assessment Comment:Patient awake  Reviewed: Allergy & Precautions, NPO status , Patient's Chart, lab work & pertinent test results  Airway Mallampati: III  TM Distance: >3 FB Neck ROM: Full    Dental  (+) Poor Dentition, Missing,    Pulmonary former smoker,    Pulmonary exam normal breath sounds clear to auscultation       Cardiovascular hypertension, Pt. on home beta blockers Normal cardiovascular exam Rhythm:Regular Rate:Normal  ECG: SR, rate 53   Neuro/Psych Blindness  Neuromuscular disease negative psych ROS   GI/Hepatic negative GI ROS, Neg liver ROS,   Endo/Other  diabetes, Oral Hypoglycemic Agents  Renal/GU ESRF and DialysisRenal disease     Musculoskeletal negative musculoskeletal ROS (+)   Abdominal (+) + obese,   Peds  Hematology  (+) anemia ,   Anesthesia Other Findings END STAGE RENAL DISEASE  Reproductive/Obstetrics                            Anesthesia Physical Anesthesia Plan  ASA: IV  Anesthesia Plan: General   Post-op Pain Management:    Induction: Intravenous  PONV Risk Score and Plan: 2 and Ondansetron, Dexamethasone and Treatment may vary due to age or medical condition  Airway Management Planned: LMA  Additional Equipment:   Intra-op Plan:   Post-operative Plan: Extubation in OR  Informed Consent: I have reviewed the patients History and Physical, chart, labs and discussed the procedure including the risks, benefits and alternatives for the proposed anesthesia with the patient or authorized representative who has indicated his/her understanding and acceptance.     Dental advisory given  Plan Discussed with: CRNA  Anesthesia Plan Comments:        Anesthesia Quick Evaluation

## 2019-10-29 NOTE — Progress Notes (Signed)
PROGRESS NOTE  Christopher Davila MWN:[id number] DOB: September 22, 1970   PCP: Marliss Coots, NP  Patient is from: Home.  Lives with his cousin.  DOA: 10/13/2019 LOS: 73  Brief Narrative / Interim history: 49 year old male with history of hypertension, diabetes mellitus/gastroparesis/blindness, CKD stage IV, chronic diastolic CHF admitted with diarrhea, weakness and failure to thrive. He was  recently admitted 4/10-12 for acute on chronic diastolic CHF.  He returned to the ER on 4/20 but did not qualify for hospitalization at that time and he has been not able to be placed in a SNF. In the ED had severe acidosis with a gap, given bicarb IV fluids and was admitted. Diarrhea has since resolved C. difficile negative GI panel negative.  Patient developed progressive fluid overload with lower leg edema acute on chronic diastolic CHF. Was started on lasix drip and metolazone but remained fluid overloaded with oliguria and uremic symptoms.   Patient had [facility] and started HD on 10/28/2019.  Heart right arm aVF on 5/14.   Subjective: Seen and examined earlier this morning.  No major events overnight of this morning.  No complaints other than lack of sleep and feeling tired.  He denies chest pain, dyspnea or GI symptoms.  He does not recall having dialysis last night.  However, he is aware of the plan about surgery.  Objective: Vitals:   10/29/19 0331 10/29/19 0400 10/29/19 0412 10/29/19 1138  BP: (!) 148/84  127/68   Pulse: 72  72   Resp: 18  20   Temp: 98 F (36.7 C)  (!) 97.5 F (36.4 C)   TempSrc: Oral  Oral   SpO2: 100%  98%   Weight: 84.2 kg 80.7 kg    Height:    5\' 3"  (1.6 m)    Intake/Output Summary (Last 24 hours) at 10/29/2019 1212 Last data filed at 10/29/2019 0400 Gross per 24 hour  Intake 245 ml  Output 2150 ml  Net -1905 ml   Filed Weights   10/29/19 0120 10/29/19 0331 10/29/19 0400  Weight: 86.2 kg 84.2 kg 80.7 kg    Examination:  GENERAL: No apparent distress.  Nontoxic. HEENT:  MMM.  Legally blind.  Hearing grossly intact. NECK: Supple.  No apparent JVD.  RESP: 98% on 2 L.  No IWOB.  Fair aeration bilaterally. CVS:  RRR. Heart sounds normal.  ABD/GI/GU: BS+. Abd soft, NTND.  Significant penile and scrotal swelling. MSK/EXT:  Moves extremities.  2+ pitting edema in BLE.  Scrotal and penile edema. SKIN: no apparent skin lesion or wound NEURO: Awake, alert and oriented appropriately.  No apparent focal neuro deficit. PSYCH: Calm. Normal affect.  Procedures:  None  Microbiology summarized: 4/28-influenza PCR and COVID-19 PCR negative. 4/28-C. difficile negative. 4/28-GI panel negative. 5/12-blood and urine cultures ordered.  Assessment & Plan: Acute respiratory failure with hypoxia due to acute on chronic diastolic CHF/fluid overload Acute on chronic diastolic CHF/fluid overload in the setting of renal failure AKI on CKD-4 progressing to ESRD/oliguria Hypervolemic hyponatremia due to CHF and renal failure Uremia/anion gap metabolic acidosis due to renal failure-improved Bone mineral disorder/hyperphosphatemia Anemia of renal disease-H&H stable after 1 unit -No response to high-dose diuretics.  -TDC and initial HD on 5/13.  Right arm aVF on 5/14 -Scrotal sling for scrotal edema -IV iron and Aranesp per nephrology  Acute metabolic encephalopathy: Uremia?  BUN 111.  Ammonia 43.  CT head without acute finding.  CT abdomen consistent with fluid overload versus infectious process. TSH and cortisol previously normal.  No apparent focal neuro deficits.  UA concerning for UTI but obtained from existing Foley.  He also has mild leukocytosis and brief hypothermia.  Blood cultures negative.  Overall, encephalopathy resolved.  He is awake and fairly oriented now. -Hold off antibiotics for now -Uremia per nephrology. -Continue holding gabapentin  Acute urine retention: Indwelling Foley catheter placed here. -Continue indwelling Foley for now given significant penile  edema.  Hypothermia: Resolved. -Work-up not revealing for specific etiology.   Controlled DM-2: A1c 6.4 09/19/2019 Recent Labs    10/28/19 2127 10/29/19 0611 10/29/19 1052  GLUCAP 136* 127* 115*  -Continue SSI and Lantus 5 units daily  Essential hypertension: Normotensive -Hold morning meds  Hyperlipidemia: continue statin.  Diarrhea: Resolved.  Generalized weakness/Debility:  -Therapy recommended SNF.  Goal of care: Significant comorbidity, legally blind with social barrier now on dialysis.  Full code. -Palliative care consulted.          DVT prophylaxis: Subcu heparin Code Status: Full code Family Communication: Updated [name], patient's cousin listed as patient's contact on 5/12 Status is: Inpatient  Remains inpatient appropriate because: Fluid overload/CHF exacerbation/AKI on CKD-4/metabolic encephalopathy  Dispo: The patient is from: Home              Anticipated d/c is to: To be determined              Anticipated d/c date is: > 3 days              Patient currently is not medically stable to d/c.        Consultants:  Nephrology IR VVS PMT   Sch Meds:  Scheduled Meds: . [MAR Hold] aspirin EC  81 mg Oral Daily  . [MAR Hold] bisacodyl  10 mg Rectal Once  . [MAR Hold] brimonidine  1 drop Left Eye BID  . [MAR Hold] calcium acetate  1,334 mg Oral TID WC  . [MAR Hold] Chlorhexidine Gluconate Cloth  6 each Topical Daily  . [MAR Hold] Chlorhexidine Gluconate Cloth  6 each Topical Q0600  . [MAR Hold] darbepoetin (ARANESP) injection - NON-DIALYSIS  100 mcg Subcutaneous Q Mon-1800  . [MAR Hold] dorzolamide-timolol  1 drop Left Eye BID  . [MAR Hold] gatifloxacin  1 drop Left Eye QID  . [MAR Hold] hydrALAZINE  25 mg Oral TID  . [MAR Hold] insulin aspart  0-9 Units Subcutaneous TID WC  . [MAR Hold] latanoprost  1 drop Left Eye QHS  . [MAR Hold] mupirocin ointment  1 application Nasal BID  . [MAR Hold] senna-docusate  1 tablet Oral BID   Continuous  Infusions: . [MAR Hold] sodium chloride 250 mL (10/16/19 0909)  . sodium chloride 10 mL/hr at 10/29/19 1142  . [MAR Hold] cefUROXime (ZINACEF)  IV     PRN Meds:.[MAR Hold] sodium chloride, [MAR Hold] acetaminophen **OR** [MAR Hold] acetaminophen  Antimicrobials: Anti-infectives (From admission, onward)   Start     Dose/Rate Route Frequency Ordered Stop   10/29/19 0600  [MAR Hold]  cefUROXime (ZINACEF) 1.5 g in sodium chloride 0.9 % 100 mL IVPB     (MAR Hold since Fri 10/29/2019 at 1107.Hold Reason: Transfer to a Procedural area.)   1.5 g 200 mL/hr over 30 Minutes Intravenous On call to O.R. 10/28/19 0727 10/30/19 0559   10/28/19 0839  ceFAZolin (ANCEF) IVPB 2g/100 mL premix     over 30 Minutes  Continuous PRN 10/28/19 0846 10/28/19 0909   10/28/19 0826  ceFAZolin (ANCEF) 2-4 GM/100ML-% IVPB    Note to  Pharmacy: Domenick Bookbinder   : cabinet override      10/28/19 0826 10/28/19 2044   10/28/19 0800  ceFAZolin (ANCEF) IVPB 2g/100 mL premix     2 g 200 mL/hr over 30 Minutes Intravenous To Radiology 10/28/19 0223 10/29/19 0800   10/28/19 0000  ceFAZolin (ANCEF) IVPB 2g/100 mL premix  Status:  Discontinued     2 g 200 mL/hr over 30 Minutes Intravenous To Radiology 10/27/19 1333 10/28/19 0223       I have personally reviewed the following labs and images: CBC: Recent Labs  Lab 10/24/19 0419 10/24/19 0419 10/25/19 0532 10/26/19 0611 10/27/19 0719 10/28/19 0753 10/29/19 0609  WBC 6.4   < > 6.6 5.2 10.3 12.0* 13.0*  NEUTROABS 4.5  --  4.7 3.6 8.5* 9.6*  --   HGB 8.1*   < > 8.3* 8.4* 8.5* 8.3* 8.1*  HCT 26.2*   < > 26.9* 26.9* 27.1* 25.2* 25.0*  MCV 91.0   < > 92.1 91.5 90.6 88.4 87.7  PLT 190   < > 206 215 213 222 210   < > = values in this interval not displayed.   BMP &GFR Recent Labs  Lab 10/26/19 0611 10/27/19 0719 10/28/19 0753 10/29/19 0158 10/29/19 0609  NA 131* 130* 132* 132* 133*  K 3.8 3.9 3.9 4.0 3.7  CL 94* 94* 93* 92* 97*  CO2 20* 20* 20* 22 22  GLUCOSE  209* 184* 102* 158* 137*  BUN 101* 105* 111* 120* 86*  CREATININE 5.84* 6.49* 7.00* 7.25* 5.69*  CALCIUM 8.6* 8.9 8.7* 8.5* 8.4*  PHOS 9.5* 9.1* 9.1* 9.8* 7.2*   Estimated Creatinine Clearance: 15.1 mL/min (A) (by C-G formula based on SCr of 5.69 mg/dL (H)). Liver & Pancreas: Recent Labs  Lab 10/25/19 0751 10/25/19 0751 10/26/19 2542 10/27/19 0719 10/28/19 0753 10/29/19 0158 10/29/19 0609  AST 52*  --   --   --   --   --   --   ALT 135*  --   --   --   --   --   --   ALKPHOS 241*  --   --   --   --   --   --   BILITOT 1.1  --   --   --   --   --   --   PROT 6.4*  --   --   --   --   --   --   ALBUMIN 2.5*   < > 2.4* 2.6* 2.4* 2.3* 2.2*   < > = values in this interval not displayed.   No results for input(s): LIPASE, AMYLASE in the last 168 hours. Recent Labs  Lab 10/28/19 0753  AMMONIA 43*   Diabetic: No results for input(s): HGBA1C in the last 72 hours. Recent Labs  Lab 10/28/19 1141 10/28/19 1633 10/28/19 2127 10/29/19 0611 10/29/19 1052  GLUCAP 101* 92 136* 127* 115*   Cardiac Enzymes: No results for input(s): CKTOTAL, CKMB, CKMBINDEX, TROPONINI in the last 168 hours. No results for input(s): PROBNP in the last 8760 hours. Coagulation Profile: No results for input(s): INR, PROTIME in the last 168 hours. Thyroid Function Tests: No results for input(s): TSH, T4TOTAL, FREET4, T3FREE, THYROIDAB in the last 72 hours. Lipid Profile: No results for input(s): CHOL, HDL, LDLCALC, TRIG, CHOLHDL, LDLDIRECT in the last 72 hours. Anemia Panel: No results for input(s): VITAMINB12, FOLATE, FERRITIN, TIBC, IRON, RETICCTPCT in the last 72 hours. Urine analysis:    Component Value  Date/Time   COLORURINE AMBER (A) 10/27/2019 1559   APPEARANCEUR TURBID (A) 10/27/2019 1559   LABSPEC 1.014 10/27/2019 1559   PHURINE 5.0 10/27/2019 1559   GLUCOSEU 50 (A) 10/27/2019 1559   HGBUR LARGE (A) 10/27/2019 1559   BILIRUBINUR NEGATIVE 10/27/2019 1559   KETONESUR NEGATIVE 10/27/2019  1559   PROTEINUR 100 (A) 10/27/2019 1559   UROBILINOGEN 0.2 12/13/2014 2056   NITRITE NEGATIVE 10/27/2019 1559   LEUKOCYTESUR LARGE (A) 10/27/2019 1559   Sepsis Labs: Invalid input(s): PROCALCITONIN, [place]  Microbiology: Recent Results (from the past 240 hour(s))  Culture, Urine     Status: Abnormal   Collection Time: 10/27/19 11:30 AM   Specimen: Urine, Catheterized  Result Value Ref Range Status   Specimen Description URINE, CATHETERIZED  Final   Special Requests   Final    NONE Performed at [facility], [address], [place], [place] [address]    Culture >=100,000 COLONIES/mL ENTEROCOCCUS FAECALIS (A)  Final   Report Status 10/29/2019 FINAL  Final   Organism ID, Bacteria ENTEROCOCCUS FAECALIS (A)  Final      Susceptibility   Enterococcus faecalis - MIC*    AMPICILLIN <=2 SENSITIVE Sensitive     NITROFURANTOIN <=16 SENSITIVE Sensitive     VANCOMYCIN 1 SENSITIVE Sensitive     * >=100,000 COLONIES/mL ENTEROCOCCUS FAECALIS  Culture, blood (routine x 2)     Status: None (Preliminary result)   Collection Time: 10/27/19 12:08 PM   Specimen: BLOOD RIGHT HAND  Result Value Ref Range Status   Specimen Description BLOOD RIGHT HAND  Final   Special Requests   Final    BOTTLES DRAWN AEROBIC AND ANAEROBIC Blood Culture adequate volume   Culture   Final    NO GROWTH < 24 HOURS Performed at [facility], [place] [address] [place], [place] [address]    Report Status PENDING  Incomplete  Culture, blood (routine x 2)     Status: None (Preliminary result)   Collection Time: 10/27/19 12:08 PM   Specimen: BLOOD RIGHT HAND  Result Value Ref Range Status   Specimen Description BLOOD RIGHT HAND  Final   Special Requests   Final    BOTTLES DRAWN AEROBIC AND ANAEROBIC Blood Culture adequate volume   Culture   Final    NO GROWTH < 24 HOURS Performed at [facility], [place] [address], [place], [place] [address]    Report Status PENDING  Incomplete  Surgical pcr  screen     Status: Abnormal   Collection Time: 10/28/19 11:30 PM   Specimen: Nasal Mucosa; Nasal Swab  Result Value Ref Range Status   MRSA, PCR POSITIVE (A) NEGATIVE Final    Comment: RESULT CALLED TO, READ BACK BY AND VERIFIED WITH: NITURADA,L RN 10/29/2019 AT 0259 SKEEN,P    Staphylococcus aureus POSITIVE (A) NEGATIVE Final    Comment: (NOTE) The Xpert SA Assay (FDA approved for NASAL specimens in patients 46 years of age and older), is one component of a comprehensive surveillance program. It is not intended to diagnose infection nor to guide or monitor treatment.     Radiology Studies: No results found.  Borden Thune T. McGraw  If 7PM-7AM, please contact night-coverage [email or web address] Password [email or web address] 10/29/2019, 12:12 PM

## 2019-10-30 LAB — GLUCOSE, CAPILLARY
Glucose-Capillary: 172 mg/dL — ABNORMAL HIGH (ref 70–99)
Glucose-Capillary: 142 mg/dL — ABNORMAL HIGH (ref 70–99)
Glucose-Capillary: 148 mg/dL — ABNORMAL HIGH (ref 70–99)
Glucose-Capillary: 161 mg/dL — ABNORMAL HIGH (ref 70–99)

## 2019-10-30 LAB — RENAL FUNCTION PANEL
Albumin: 2.4 g/dL — ABNORMAL LOW (ref 3.5–5.0)
Anion gap: 15 (ref 5–15)
BUN: 91 mg/dL — ABNORMAL HIGH (ref 6–20)
CO2: 22 mmol/L (ref 22–32)
Calcium: 8.2 mg/dL — ABNORMAL LOW (ref 8.9–10.3)
Chloride: 96 mmol/L — ABNORMAL LOW (ref 98–111)
Creatinine, Ser: 6.13 mg/dL — ABNORMAL HIGH (ref 0.61–1.24)
GFR calc Af Amer: 12 mL/min — ABNORMAL LOW (ref >60)
GFR calc non Af Amer: 10 mL/min — ABNORMAL LOW (ref >60)
Glucose, Bld: 164 mg/dL — ABNORMAL HIGH (ref 70–99)
Phosphorus: 7.4 mg/dL — ABNORMAL HIGH (ref 2.5–4.6)
Potassium: 3.9 mmol/L (ref 3.5–5.1)
Sodium: 133 mmol/L — ABNORMAL LOW (ref 135–145)

## 2019-10-30 LAB — CBC
HCT: 23.6 % — ABNORMAL LOW (ref 39.0–52.0)
Hemoglobin: 7.7 g/dL — ABNORMAL LOW (ref 13.0–17.0)
MCH: 28.3 pg (ref 26.0–34.0)
MCHC: 32.6 g/dL (ref 30.0–36.0)
MCV: 86.8 fL (ref 80.0–100.0)
Platelets: 201 10*3/uL (ref 150–400)
RBC: 2.72 MIL/uL — ABNORMAL LOW (ref 4.22–5.81)
RDW: 17.5 % — ABNORMAL HIGH (ref 11.5–15.5)
WBC: 11 10*3/uL — ABNORMAL HIGH (ref 4.0–10.5)
nRBC: 0.4 % — ABNORMAL HIGH (ref 0.0–0.2)

## 2019-10-30 LAB — MAGNESIUM: Magnesium: 2.2 mg/dL (ref 1.7–2.4)

## 2019-10-30 MED ORDER — LIDOCAINE VISCOUS HCL 2 % MT SOLN
15.0000 mL | Freq: Four times a day (QID) | OROMUCOSAL | Status: DC | PRN
  Administered 2019-10-30: 15 mL via OROMUCOSAL
  Filled 2019-10-30: qty 15

## 2019-10-30 MED ORDER — ONDANSETRON HCL 4 MG/2ML IJ SOLN
4.0000 mg | Freq: Four times a day (QID) | INTRAMUSCULAR | Status: DC | PRN
  Administered 2019-10-30 (×2): 4 mg via INTRAVENOUS
  Filled 2019-10-30 (×3): qty 2

## 2019-10-30 NOTE — Progress Notes (Signed)
Occupational Therapy Treatment Patient Details Name: Christopher Davila MRN: [id number] DOB: April 21, 1971 Today's Date: 10/30/2019    History of present illness Pt is an 49 y.o. male with PMH DM, HTN, CKD stage IIIb, peripheral neuropathy, diabetic retinopathy, bilateral vision loss, glaucoma, chronic diastolic heart failure who presented to ED with increased weakness.   OT comments  Pt seen to check on scrotal sling fit and tolerance. Upon arrival pt vomiting, endorses it is from anesthesia and he has not been able to keep anything down. Scrotal sling not present in room, pt is unaware of where it may be. OT educated pt on various means of scrotal positioning to reduce swelling until new sling can be fabricated. Pt could not tolerate OOB to fabricate sling 2/2 vomiting and fatigue. Will continue to follow to fabricate new sling, and advance BADL mobility. D/c recs remain appropriate.    Follow Up Recommendations  SNF;Supervision/Assistance - 24 hour    Equipment Recommendations  None recommended by OT    Recommendations for Other Services      Precautions / Restrictions Precautions Precautions: Fall Precaution Comments: blind; scrotal swelling Required Braces or Orthoses: Other Brace Other Brace: scrotal sling Restrictions Weight Bearing Restrictions: No       Mobility Bed Mobility                  Transfers                      Balance                                           ADL either performed or assessed with clinical judgement   ADL                                         General ADL Comments: session focusedon checking scrotal sling fit and comfort. It was not present and staff is unaware of its location. Educated pt on various means of elevation to reduce swelling until new on can be fabricated. Pt vomiting and could not tolerate this date     Vision Baseline Vision/History: Legally blind     Perception      Praxis      Cognition Arousal/Alertness: Awake/alert Behavior During Therapy: Flat affect Overall Cognitive Status: Impaired/Different from baseline Area of Impairment: Awareness;Following commands;Safety/judgement                     Memory: Decreased short-term memory Following Commands: Follows one step commands inconsistently;Follows one step commands with increased time Safety/Judgement: Decreased awareness of safety;Decreased awareness of deficits Awareness: Intellectual Problem Solving: Slow processing;Requires verbal cues;Requires tactile cues General Comments: increased processing time and cues needed        Exercises     Shoulder Instructions       General Comments      Pertinent Vitals/ Pain       Pain Assessment: Faces Faces Pain Scale: Hurts little more Pain Location: scrotum, penis Pain Descriptors / Indicators: Grimacing Pain Intervention(s): Limited activity within patient's tolerance;Monitored during session;Repositioned  Home Living  Prior Functioning/Environment              Frequency           Progress Toward Goals  OT Goals(current goals can now be found in the care plan section)  Progress towards OT goals: Progressing toward goals  Acute Rehab OT Goals Patient Stated Goal: be able to eat without vomiting OT Goal Formulation: With patient Time For Goal Achievement: 11/18/19 Potential to Achieve Goals: Good  Plan Discharge plan remains appropriate;Frequency remains appropriate    Co-evaluation                 AM-PAC OT "6 Clicks" Daily Activity     Outcome Measure   Help from another person eating meals?: A Little Help from another person taking care of personal grooming?: A Little Help from another person toileting, which includes using toliet, bedpan, or urinal?: A Lot Help from another person bathing (including washing, rinsing, drying)?: A Lot Help  from another person to put on and taking off regular upper body clothing?: A Lot Help from another person to put on and taking off regular lower body clothing?: Total 6 Click Score: 13    End of Session    OT Visit Diagnosis: Other abnormalities of gait and mobility (R26.89);Unsteadiness on feet (R26.81);Pain;Muscle weakness (generalized) (M62.81) Pain - part of body: (penis and scrotum)   Activity Tolerance Treatment limited secondary to medical complications (Comment);Other (comment)(vomiting, fatigue)   Patient Left with call bell/phone within reach;with bed alarm set   Nurse Communication Mobility status;Need for lift equipment        Time: [phone] OT Time Calculation (min): 9 min  Charges: OT General Charges $OT Visit: 1 Visit OT Treatments $Self Care/Home Management : 8-22 mins  Zenovia Jarred, MSOT, OTR/L Acute Rehabilitation Services [facility] Office Number: [phone] Pager: [phone]  Zenovia Jarred 10/30/2019, 3:30 PM

## 2019-10-30 NOTE — Progress Notes (Signed)
Palpable thrill in fistula No signs of steal  Incision without hematoma  Will arrange follow up in about 6 weeks  Ruta Hinds, MD [facility] Office: [phone]

## 2019-10-30 NOTE — Plan of Care (Signed)
  Problem: Education: Goal: Ability to demonstrate management of disease process will improve Outcome: Progressing Goal: Ability to verbalize understanding of medication therapies will improve Outcome: Progressing   Problem: Activity: Goal: Capacity to carry out activities will improve Outcome: Progressing  Patient with c/o nausea since starting dialysis. Rationale fordialysis reinforced as patient is still very edematous. Antiemetic meds given. Patient refusing food and insulin this morning but took oral blood pressure medication.

## 2019-10-30 NOTE — Progress Notes (Signed)
Patient ID: Christopher Davila, male   DOB: Jul 11, 1970, 49 y.o.   MRN: [id number]    S. Lots of nausea this morning also with some diarrhea. Symptoms seem to be intermittent. Minimal urine output. Status post right brachial basilic fistula yesterday with no issues. Plan for dialysis again today. Palliative care met with patient yesterday.   O:BP 132/81 (BP Location: Left Arm)   Pulse 70   Temp 97.7 F (36.5 C) (Oral)   Resp 20   Ht '5\' 3"'  (1.6 m)   Wt 80.7 kg   SpO2 100%   BMI 31.53 kg/m   Intake/Output Summary (Last 24 hours) at 10/30/2019 1029 Last data filed at 10/30/2019 0442 Gross per 24 hour  Intake 1190 ml  Output 1390 ml  Net -200 ml   Intake/Output: I/O last 3 completed shifts: In: 9371 [P.O.:240; I.V.:600; IV Piggyback:355] Out: 6967 [Urine:1025; Emesis/NG output:500; Other:2000; Blood:15]  Intake/Output this shift:  No intake/output data recorded. Weight change: -5.46 kg Gen: NAD, lying in bed CVS: no rub, normal rate Resp: CTA BL, no iwob Abd: +BS, soft, NT/ND Ext: 2+ edema in BLE  Recent Labs  Lab 10/25/19 0751 10/26/19 0611 10/27/19 0719 10/28/19 0753 10/29/19 0158 10/29/19 0609 10/30/19 0621  NA 133* 131* 130* 132* 132* 133* 133*  K 3.9 3.8 3.9 3.9 4.0 3.7 3.9  CL 96* 94* 94* 93* 92* 97* 96*  CO2 21* 20* 20* 20* '22 22 22  ' GLUCOSE 148* 209* 184* 102* 158* 137* 164*  BUN 98* 101* 105* 111* 120* 86* 91*  CREATININE 5.26* 5.84* 6.49* 7.00* 7.25* 5.69* 6.13*  ALBUMIN 2.5* 2.4* 2.6* 2.4* 2.3* 2.2* 2.4*  CALCIUM 8.7* 8.6* 8.9 8.7* 8.5* 8.4* 8.2*  PHOS 9.0* 9.5* 9.1* 9.1* 9.8* 7.2* 7.4*  AST 52*  --   --   --   --   --   --   ALT 135*  --   --   --   --   --   --    Liver Function Tests: Recent Labs  Lab 10/25/19 0751 10/26/19 0611 10/29/19 0158 10/29/19 0609 10/30/19 0621  AST 52*  --   --   --   --   ALT 135*  --   --   --   --   ALKPHOS 241*  --   --   --   --   BILITOT 1.1  --   --   --   --   PROT 6.4*  --   --   --   --   ALBUMIN 2.5*   < >  2.3* 2.2* 2.4*   < > = values in this interval not displayed.   No results for input(s): LIPASE, AMYLASE in the last 168 hours. Recent Labs  Lab 10/28/19 0753  AMMONIA 43*   CBC: Recent Labs  Lab 10/26/19 0611 10/26/19 0611 10/27/19 0719 10/27/19 0719 10/28/19 0753 10/29/19 0609 10/30/19 0621  WBC 5.2   < > 10.3   < > 12.0* 13.0* 11.0*  NEUTROABS 3.6  --  8.5*  --  9.6*  --   --   HGB 8.4*   < > 8.5*   < > 8.3* 8.1* 7.7*  HCT 26.9*   < > 27.1*   < > 25.2* 25.0* 23.6*  MCV 91.5  --  90.6  --  88.4 87.7 86.8  PLT 215   < > 213   < > 222 210 201   < > = values in this  interval not displayed.   Cardiac Enzymes: No results for input(s): CKTOTAL, CKMB, CKMBINDEX, TROPONINI in the last 168 hours. CBG: Recent Labs  Lab 10/29/19 1052 10/29/19 1413 10/29/19 1607 10/29/19 2114 10/30/19 0608  GLUCAP 115* 130* 129* 194* 172*    Iron Studies: No results for input(s): IRON, TIBC, TRANSFERRIN, FERRITIN in the last 72 hours. Studies/Results: No results found. Marland Kitchen aspirin EC  81 mg Oral Daily  . bisacodyl  10 mg Rectal Once  . brimonidine  1 drop Left Eye BID  . calcium acetate  1,334 mg Oral TID WC  . Chlorhexidine Gluconate Cloth  6 each Topical Daily  . Chlorhexidine Gluconate Cloth  6 each Topical Q0600  . darbepoetin (ARANESP) injection - NON-DIALYSIS  100 mcg Subcutaneous Q Mon-1800  . dorzolamide-timolol  1 drop Left Eye BID  . gatifloxacin  1 drop Left Eye QID  . hydrALAZINE  25 mg Oral TID  . insulin aspart  0-9 Units Subcutaneous TID WC  . latanoprost  1 drop Left Eye QHS  . mupirocin ointment  1 application Nasal BID  . senna-docusate  1 tablet Oral BID    BMET    Component Value Date/Time   NA 133 (L) 10/30/2019 0621   K 3.9 10/30/2019 0621   CL 96 (L) 10/30/2019 0621   CO2 22 10/30/2019 0621   GLUCOSE 164 (H) 10/30/2019 0621   BUN 91 (H) 10/30/2019 0621   CREATININE 6.13 (H) 10/30/2019 0621   CALCIUM 8.2 (L) 10/30/2019 0621   GFRNONAA 10 (L) 10/30/2019  0621   GFRAA 12 (L) 10/30/2019 0621   CBC    Component Value Date/Time   WBC 11.0 (H) 10/30/2019 0621   RBC 2.72 (L) 10/30/2019 0621   HGB 7.7 (L) 10/30/2019 0621   HCT 23.6 (L) 10/30/2019 0621   PLT 201 10/30/2019 0621   MCV 86.8 10/30/2019 0621   MCH 28.3 10/30/2019 0621   MCHC 32.6 10/30/2019 0621   RDW 17.5 (H) 10/30/2019 0621   LYMPHSABS 0.9 10/28/2019 0753   MONOABS 1.4 (H) 10/28/2019 0753   EOSABS 0.0 10/28/2019 0753   BASOSABS 0.0 10/28/2019 0753    Assessment/Plan:  1. AKI/CKD stage IV- in setting of acute on chronic diastolic CHF and diuresis. His baselineis3-3.5 and is followed by Dr. Carolin Sicks at [facility]. BUN/Crt climbed and volume status worsened despite diuresis. Started dialysis and tolerating well. Will plan for second treatment today and then Monday Wednesday Friday thereafter. Difficulties with insight and palliative care is following.  2. Dialysis Access: [facility] on 5/13. AVF creation 10/29/2019. Will CTM 3. Acute on chronic diastolic CHF-not responding to high dose diuretics- Uf with dialysis 4. Urinary retention- s/p foley catheter and mayneed to be discharged with this. Given minimal UOP could consider removal prior to DC. 5. Anemia of CKD stage IV- hgbdropped to 6.8 and received a unit of PRBC's at that time.Received 2 doses IV feraheme on 5/1 and Aranesp 40 mcg on 4/30. ESA increased to 192mg on 5/10. Will CTM. Hgb stable but not optimal 6. DM type 2 with hyperglycemia - per primary svc 7. Bones-  Hyperphosphatemia: persistent today. May consider adjusting PhosLo if remains elevated tomorrow. - PTH 172 8. Hyponatremia-  Hypervolemic hyponatremia-  Will improve as UF is removed on Dialysis    [place]([phone]

## 2019-10-30 NOTE — Progress Notes (Signed)
PROGRESS NOTE  Christopher Davila JKK:[id number] DOB: 08-Oct-1970   PCP: Marliss Coots, NP  Patient is from: Home.  Lives with his cousin.  DOA: 10/13/2019 LOS: 15  Brief Narrative / Interim history: 49 year old male with history of hypertension, diabetes mellitus/gastroparesis/blindness, CKD stage IV, chronic diastolic CHF admitted with diarrhea, weakness and failure to thrive. He was  recently admitted 4/10-12 for acute on chronic diastolic CHF.  He returned to the ER on 4/20 but did not qualify for hospitalization at that time and he has been not able to be placed in a SNF. In the ED had severe acidosis with a gap, given bicarb IV fluids and was admitted. Diarrhea has since resolved C. difficile negative GI panel negative.  Patient developed progressive fluid overload with lower leg edema acute on chronic diastolic CHF. Was started on lasix drip and metolazone but remained fluid overloaded with oliguria and uremic symptoms.   Patient had [place] and started HD on 10/28/2019.  Had right arm aVF on 5/14.   Subjective: Seen and examined earlier this morning.  No major events overnight or this morning.  He reports some heartburn, emesis, abdominal pain and diarrhea earlier this morning.  Symptoms resolved now.  He denies chest pain or dyspnea.  Improved urine output.  Objective: Vitals:   10/29/19 1946 10/29/19 2247 10/30/19 0440 10/30/19 1000  BP: 106/72 115/74 115/67 132/81  Pulse: 71  71 70  Resp: (!) 24  16 20   Temp: 98 F (36.7 C)  98.1 F (36.7 C) 97.7 F (36.5 C)  TempSrc: Oral  Oral Oral  SpO2: 98%  96% 100%  Weight:   80.7 kg   Height:        Intake/Output Summary (Last 24 hours) at 10/30/2019 1015 Last data filed at 10/30/2019 0442 Gross per 24 hour  Intake 1190 ml  Output 1390 ml  Net -200 ml   Filed Weights   10/29/19 0331 10/29/19 0400 10/30/19 0440  Weight: 84.2 kg 80.7 kg 80.7 kg    Examination:  GENERAL: No apparent distress.  Nontoxic. HEENT: MMM.  Legally blind.   Hearing grossly intact. NECK: Supple.  No apparent JVD.  RESP:  No IWOB.  Fair aeration bilaterally. CVS: 96% on 2 L RRR. Heart sounds normal.  ABD/GI/GU: BS+. Abd soft, NTND.  Indwelling Foley in place. MSK/EXT:  Moves extremities.  2+ pitting edema.  Significant scrotal and penile edema. SKIN: no apparent skin lesion or wound NEURO: Awake, alert and oriented self, place and situation but not time.  No apparent focal neuro deficit. PSYCH: Calm. Normal affect.  Procedures:  None  Microbiology summarized: 4/28-influenza PCR and COVID-19 PCR negative. 4/28-C. difficile negative. 4/28-GI panel negative. 5/12-blood and urine cultures ordered.  Assessment & Plan: Acute respiratory failure with hypoxia due to acute on chronic diastolic CHF/fluid overload Acute on chronic diastolic CHF/fluid overload in the setting of renal failure AKI on CKD-4 progressing to ESRD/oliguria-improved UOP Hypervolemic hyponatremia due to CHF and renal failure Uremia/anion gap metabolic acidosis due to renal failure-improved Bone mineral disorder/hyperphosphatemia Anemia of renal disease-H&H stable after 1 unit -No response to high-dose diuretics.  -TDC and initial HD on 5/13.  Right arm aVF on 5/14 -Scrotal sling for scrotal edema -IV iron and Aranesp per nephrology  Acute metabolic encephalopathy: Uremia?  BUN 111>>91.  Ammonia 43.  CT head without acute finding.  CT abdomen consistent with fluid overload versus infectious process. TSH and cortisol previously normal.  No apparent focal neuro deficits.  UA concerning for  UTI but obtained from existing Foley.  He also has mild leukocytosis and brief hypothermia.  Blood cultures negative.  Overall, encephalopathy resolved.  He is awake and fairly oriented now. -Hold off antibiotics for now -Uremia per nephrology-improved -Continue holding gabapentin  Acute urine retention: Indwelling Foley catheter placed here. -Continue indwelling Foley for now given  significant penile edema.  Hypothermia: Resolved. -Work-up not revealing for specific etiology.   Controlled DM-2: A1c 6.4 09/19/2019 Recent Labs    10/29/19 1607 10/29/19 2114 10/30/19 0608  GLUCAP 129* 194* 172*  -Continue SSI and Lantus 5 units daily  Essential hypertension: Normotensive -Hold morning meds  Hyperlipidemia: continue statin.  Diarrhea: Resolved.  Generalized weakness/Debility:  -Therapy recommended SNF.  Goal of care: Significant comorbidity, legally blind with social barrier now on dialysis.  Full code. -Palliative care consulted.          DVT prophylaxis: Subcu heparin Code Status: Full code Family Communication: Updated [name], patient's cousin listed as patient's contact on 5/12 Status is: Inpatient  Remains inpatient appropriate because: Fluid overload/CHF exacerbation/ESRD requiring HD. Patient needs SNF and outpt HD spot  Dispo: The patient is from: Home              Anticipated d/c is to: SNF              Anticipated d/c date is: > 3 days              Patient currently is not medically stable to d/c.        Consultants:  Nephrology IR VVS PMT   Sch Meds:  Scheduled Meds: . aspirin EC  81 mg Oral Daily  . bisacodyl  10 mg Rectal Once  . brimonidine  1 drop Left Eye BID  . calcium acetate  1,334 mg Oral TID WC  . Chlorhexidine Gluconate Cloth  6 each Topical Daily  . Chlorhexidine Gluconate Cloth  6 each Topical Q0600  . darbepoetin (ARANESP) injection - NON-DIALYSIS  100 mcg Subcutaneous Q Mon-1800  . dorzolamide-timolol  1 drop Left Eye BID  . gatifloxacin  1 drop Left Eye QID  . hydrALAZINE  25 mg Oral TID  . insulin aspart  0-9 Units Subcutaneous TID WC  . latanoprost  1 drop Left Eye QHS  . mupirocin ointment  1 application Nasal BID  . senna-docusate  1 tablet Oral BID   Continuous Infusions: . sodium chloride 250 mL (10/16/19 0909)  . sodium chloride    . sodium chloride     PRN Meds:.sodium chloride, sodium  chloride, sodium chloride, acetaminophen **OR** acetaminophen, alteplase, heparin, lidocaine (PF), lidocaine, lidocaine-prilocaine, ondansetron (ZOFRAN) IV, pentafluoroprop-tetrafluoroeth  Antimicrobials: Anti-infectives (From admission, onward)   Start     Dose/Rate Route Frequency Ordered Stop   10/29/19 0600  cefUROXime (ZINACEF) 1.5 g in sodium chloride 0.9 % 100 mL IVPB     1.5 g 200 mL/hr over 30 Minutes Intravenous On call to O.R. 10/28/19 0727 10/29/19 1232   10/28/19 0839  ceFAZolin (ANCEF) IVPB 2g/100 mL premix     over 30 Minutes  Continuous PRN 10/28/19 0846 10/28/19 0909   10/28/19 0826  ceFAZolin (ANCEF) 2-4 GM/100ML-% IVPB    Note to Pharmacy: Domenick Bookbinder   : cabinet override      10/28/19 0826 10/28/19 2044   10/28/19 0800  ceFAZolin (ANCEF) IVPB 2g/100 mL premix     2 g 200 mL/hr over 30 Minutes Intravenous To Radiology 10/28/19 0223 10/29/19 0800   10/28/19 0000  ceFAZolin (  ANCEF) IVPB 2g/100 mL premix  Status:  Discontinued     2 g 200 mL/hr over 30 Minutes Intravenous To Radiology 10/27/19 1333 10/28/19 0223       I have personally reviewed the following labs and images: CBC: Recent Labs  Lab 10/24/19 0419 10/24/19 0419 10/25/19 0532 10/25/19 0532 10/26/19 1696 10/27/19 0719 10/28/19 0753 10/29/19 0609 10/30/19 0621  WBC 6.4   < > 6.6   < > 5.2 10.3 12.0* 13.0* 11.0*  NEUTROABS 4.5  --  4.7  --  3.6 8.5* 9.6*  --   --   HGB 8.1*   < > 8.3*   < > 8.4* 8.5* 8.3* 8.1* 7.7*  HCT 26.2*   < > 26.9*   < > 26.9* 27.1* 25.2* 25.0* 23.6*  MCV 91.0   < > 92.1   < > 91.5 90.6 88.4 87.7 86.8  PLT 190   < > 206   < > 215 213 222 210 201   < > = values in this interval not displayed.   BMP &GFR Recent Labs  Lab 10/27/19 0719 10/28/19 0753 10/29/19 0158 10/29/19 0609 10/30/19 0621  NA 130* 132* 132* 133* 133*  K 3.9 3.9 4.0 3.7 3.9  CL 94* 93* 92* 97* 96*  CO2 20* 20* 22 22 22   GLUCOSE 184* 102* 158* 137* 164*  BUN 105* 111* 120* 86* 91*  CREATININE  6.49* 7.00* 7.25* 5.69* 6.13*  CALCIUM 8.9 8.7* 8.5* 8.4* 8.2*  MG  --   --   --   --  2.2  PHOS 9.1* 9.1* 9.8* 7.2* 7.4*   Estimated Creatinine Clearance: 14 mL/min (A) (by C-G formula based on SCr of 6.13 mg/dL (H)). Liver & Pancreas: Recent Labs  Lab 10/25/19 0751 10/26/19 7893 10/27/19 0719 10/28/19 0753 10/29/19 0158 10/29/19 0609 10/30/19 0621  AST 52*  --   --   --   --   --   --   ALT 135*  --   --   --   --   --   --   ALKPHOS 241*  --   --   --   --   --   --   BILITOT 1.1  --   --   --   --   --   --   PROT 6.4*  --   --   --   --   --   --   ALBUMIN 2.5*   < > 2.6* 2.4* 2.3* 2.2* 2.4*   < > = values in this interval not displayed.   No results for input(s): LIPASE, AMYLASE in the last 168 hours. Recent Labs  Lab 10/28/19 0753  AMMONIA 43*   Diabetic: No results for input(s): HGBA1C in the last 72 hours. Recent Labs  Lab 10/29/19 1052 10/29/19 1413 10/29/19 1607 10/29/19 2114 10/30/19 0608  GLUCAP 115* 130* 129* 194* 172*   Cardiac Enzymes: No results for input(s): CKTOTAL, CKMB, CKMBINDEX, TROPONINI in the last 168 hours. No results for input(s): PROBNP in the last 8760 hours. Coagulation Profile: No results for input(s): INR, PROTIME in the last 168 hours. Thyroid Function Tests: No results for input(s): TSH, T4TOTAL, FREET4, T3FREE, THYROIDAB in the last 72 hours. Lipid Profile: No results for input(s): CHOL, HDL, LDLCALC, TRIG, CHOLHDL, LDLDIRECT in the last 72 hours. Anemia Panel: No results for input(s): VITAMINB12, FOLATE, FERRITIN, TIBC, IRON, RETICCTPCT in the last 72 hours. Urine analysis:    Component Value Date/Time  COLORURINE AMBER (A) 10/27/2019 1559   APPEARANCEUR TURBID (A) 10/27/2019 1559   LABSPEC 1.014 10/27/2019 1559   PHURINE 5.0 10/27/2019 1559   GLUCOSEU 50 (A) 10/27/2019 1559   HGBUR LARGE (A) 10/27/2019 1559   BILIRUBINUR NEGATIVE 10/27/2019 1559   KETONESUR NEGATIVE 10/27/2019 1559   PROTEINUR 100 (A) 10/27/2019  1559   UROBILINOGEN 0.2 12/13/2014 2056   NITRITE NEGATIVE 10/27/2019 1559   LEUKOCYTESUR LARGE (A) 10/27/2019 1559   Sepsis Labs: Invalid input(s): PROCALCITONIN, [place]  Microbiology: Recent Results (from the past 240 hour(s))  Culture, Urine     Status: Abnormal   Collection Time: 10/27/19 11:30 AM   Specimen: Urine, Catheterized  Result Value Ref Range Status   Specimen Description URINE, CATHETERIZED  Final   Special Requests   Final    NONE Performed at [facility], [address], [place], [place] [address]    Culture >=100,000 COLONIES/mL ENTEROCOCCUS FAECALIS (A)  Final   Report Status 10/29/2019 FINAL  Final   Organism ID, Bacteria ENTEROCOCCUS FAECALIS (A)  Final      Susceptibility   Enterococcus faecalis - MIC*    AMPICILLIN <=2 SENSITIVE Sensitive     NITROFURANTOIN <=16 SENSITIVE Sensitive     VANCOMYCIN 1 SENSITIVE Sensitive     * >=100,000 COLONIES/mL ENTEROCOCCUS FAECALIS  Culture, blood (routine x 2)     Status: None (Preliminary result)   Collection Time: 10/27/19 12:08 PM   Specimen: BLOOD RIGHT HAND  Result Value Ref Range Status   Specimen Description BLOOD RIGHT HAND  Final   Special Requests   Final    BOTTLES DRAWN AEROBIC AND ANAEROBIC Blood Culture adequate volume   Culture   Final    NO GROWTH 3 DAYS Performed at [facility], [place] [address], [place], [place] [address]    Report Status PENDING  Incomplete  Culture, blood (routine x 2)     Status: None (Preliminary result)   Collection Time: 10/27/19 12:08 PM   Specimen: BLOOD RIGHT HAND  Result Value Ref Range Status   Specimen Description BLOOD RIGHT HAND  Final   Special Requests   Final    BOTTLES DRAWN AEROBIC AND ANAEROBIC Blood Culture adequate volume   Culture   Final    NO GROWTH 3 DAYS Performed at [facility], [place] [address], [place], [place] [address]    Report Status PENDING  Incomplete  Surgical pcr screen     Status: Abnormal   Collection Time:  10/28/19 11:30 PM   Specimen: Nasal Mucosa; Nasal Swab  Result Value Ref Range Status   MRSA, PCR POSITIVE (A) NEGATIVE Final    Comment: RESULT CALLED TO, READ BACK BY AND VERIFIED WITH: NITURADA,L RN 10/29/2019 AT 0259 SKEEN,P    Staphylococcus aureus POSITIVE (A) NEGATIVE Final    Comment: (NOTE) The Xpert SA Assay (FDA approved for NASAL specimens in patients 21 years of age and older), is one component of a comprehensive surveillance program. It is not intended to diagnose infection nor to guide or monitor treatment.     Radiology Studies: No results found.  Journey Castonguay T. Richmond  If 7PM-7AM, please contact night-coverage [email or web address] Password [facility] 10/30/2019, 10:15 AM

## 2019-10-31 LAB — RENAL FUNCTION PANEL
Albumin: 2.4 g/dL — ABNORMAL LOW (ref 3.5–5.0)
Anion gap: 14 (ref 5–15)
BUN: 71 mg/dL — ABNORMAL HIGH (ref 6–20)
CO2: 25 mmol/L (ref 22–32)
Calcium: 8.4 mg/dL — ABNORMAL LOW (ref 8.9–10.3)
Chloride: 96 mmol/L — ABNORMAL LOW (ref 98–111)
Creatinine, Ser: 4.78 mg/dL — ABNORMAL HIGH (ref 0.61–1.24)
GFR calc Af Amer: 16 mL/min — ABNORMAL LOW
GFR calc non Af Amer: 13 mL/min — ABNORMAL LOW
Glucose, Bld: 118 mg/dL — ABNORMAL HIGH (ref 70–99)
Phosphorus: 5.7 mg/dL — ABNORMAL HIGH (ref 2.5–4.6)
Potassium: 3.7 mmol/L (ref 3.5–5.1)
Sodium: 135 mmol/L (ref 135–145)

## 2019-10-31 LAB — HEPATITIS B SURFACE ANTIBODY,QUALITATIVE: Hep B S Ab: NONREACTIVE

## 2019-10-31 LAB — GLUCOSE, CAPILLARY
Glucose-Capillary: 93 mg/dL (ref 70–99)
Glucose-Capillary: 174 mg/dL — ABNORMAL HIGH (ref 70–99)
Glucose-Capillary: 181 mg/dL — ABNORMAL HIGH (ref 70–99)
Glucose-Capillary: 167 mg/dL — ABNORMAL HIGH (ref 70–99)

## 2019-10-31 LAB — HEPATITIS B CORE ANTIBODY, TOTAL: Hep B Core Total Ab: NONREACTIVE

## 2019-10-31 LAB — HEPATITIS B SURFACE ANTIGEN: Hepatitis B Surface Ag: NONREACTIVE

## 2019-10-31 MED ORDER — ALTEPLASE 2 MG IJ SOLR: 2.0000 mg | Freq: Once | INTRAMUSCULAR | Status: DC | PRN

## 2019-10-31 MED ORDER — HEPARIN SODIUM (PORCINE) 1000 UNIT/ML IJ SOLN
INTRAMUSCULAR | Status: AC
  Administered 2019-10-31: 1000 [IU] via INTRAVENOUS_CENTRAL
  Filled 2019-10-31: qty 4

## 2019-10-31 MED ORDER — CHLORHEXIDINE GLUCONATE CLOTH 2 % EX PADS
6.0000 | MEDICATED_PAD | Freq: Every day | CUTANEOUS | Status: DC
  Administered 2019-11-03 – 2019-11-12 (×9): 6 via TOPICAL

## 2019-10-31 MED ORDER — SODIUM CHLORIDE 0.9 % IV SOLN: 100.0000 mL | INTRAVENOUS | Status: DC | PRN

## 2019-10-31 MED ORDER — HEPARIN SODIUM (PORCINE) 1000 UNIT/ML DIALYSIS: 1000.0000 [IU] | INTRAMUSCULAR | Status: DC | PRN

## 2019-10-31 MED ORDER — LIDOCAINE HCL (PF) 1 % IJ SOLN
5.0000 mL | INTRAMUSCULAR | Status: DC | PRN
Start: 2019-10-31 — End: 2019-10-31

## 2019-10-31 MED ORDER — LIDOCAINE-PRILOCAINE 2.5-2.5 % EX CREA: 1.0000 "application " | TOPICAL_CREAM | CUTANEOUS | Status: DC | PRN

## 2019-10-31 MED ORDER — PENTAFLUOROPROP-TETRAFLUOROETH EX AERO
1.0000 | INHALATION_SPRAY | CUTANEOUS | Status: DC | PRN
Start: 2019-10-31 — End: 2019-10-31

## 2019-10-31 NOTE — Progress Notes (Signed)
Patient ID: Christopher Davila, male   DOB: 1971-06-13, 49 y.o.   MRN: [id number]    S.  Patient states he is feeling better today.  He was enjoying breakfast.  No issues with dialysis yesterday but did not like that it happened so late in the day.  Patient seen by vascular yesterday with plans to follow-up in 6 weeks.   O:BP 139/84 (BP Location: Left Arm)   Pulse 75   Temp 97.8 F (36.6 C) (Oral)   Resp 16   Ht 5\' 3"  (1.6 m)   Wt 80.7 kg   SpO2 100%   BMI 31.53 kg/m   Intake/Output Summary (Last 24 hours) at 10/31/2019 1027 Last data filed at 10/31/2019 0900 Gross per 24 hour  Intake 920 ml  Output 1999 ml  Net -1079 ml   Intake/Output: I/O last 3 completed shifts: In: 270 [P.O.:920] Out: 2649 [Urine:150; Emesis/NG output:500; WCBJS:2831]  Intake/Output this shift:  Total I/O In: 240 [P.O.:240] Out: -  Weight change:  Gen: NAD, lying in bed CVS: no rub, normal rate Resp: CTA BL, no iwob Abd: +BS, soft, NT/ND Ext: 2+ edema in BLE  Recent Labs  Lab 10/25/19 0751 10/25/19 0751 10/26/19 0611 10/27/19 0719 10/28/19 0753 10/29/19 0158 10/29/19 0609 10/30/19 0621 10/31/19 0302  NA 133*   < > 131* 130* 132* 132* 133* 133* 135  K 3.9   < > 3.8 3.9 3.9 4.0 3.7 3.9 3.7  CL 96*   < > 94* 94* 93* 92* 97* 96* 96*  CO2 21*   < > 20* 20* 20* 22 22 22 25   GLUCOSE 148*   < > 209* 184* 102* 158* 137* 164* 118*  BUN 98*   < > 101* 105* 111* 120* 86* 91* 71*  CREATININE 5.26*   < > 5.84* 6.49* 7.00* 7.25* 5.69* 6.13* 4.78*  ALBUMIN 2.5*   < > 2.4* 2.6* 2.4* 2.3* 2.2* 2.4* 2.4*  CALCIUM 8.7*   < > 8.6* 8.9 8.7* 8.5* 8.4* 8.2* 8.4*  PHOS 9.0*   < > 9.5* 9.1* 9.1* 9.8* 7.2* 7.4* 5.7*  AST 52*  --   --   --   --   --   --   --   --   ALT 135*  --   --   --   --   --   --   --   --    < > = values in this interval not displayed.   Liver Function Tests: Recent Labs  Lab 10/25/19 0751 10/26/19 0611 10/29/19 0609 10/30/19 0621 10/31/19 0302  AST 52*  --   --   --   --   ALT 135*   --   --   --   --   ALKPHOS 241*  --   --   --   --   BILITOT 1.1  --   --   --   --   PROT 6.4*  --   --   --   --   ALBUMIN 2.5*   < > 2.2* 2.4* 2.4*   < > = values in this interval not displayed.   No results for input(s): LIPASE, AMYLASE in the last 168 hours. Recent Labs  Lab 10/28/19 0753  AMMONIA 43*   CBC: Recent Labs  Lab 10/26/19 0611 10/26/19 0611 10/27/19 0719 10/27/19 0719 10/28/19 0753 10/29/19 0609 10/30/19 0621  WBC 5.2   < > 10.3   < > 12.0* 13.0* 11.0*  NEUTROABS 3.6  --  8.5*  --  9.6*  --   --   HGB 8.4*   < > 8.5*   < > 8.3* 8.1* 7.7*  HCT 26.9*   < > 27.1*   < > 25.2* 25.0* 23.6*  MCV 91.5  --  90.6  --  88.4 87.7 86.8  PLT 215   < > 213   < > 222 210 201   < > = values in this interval not displayed.   Cardiac Enzymes: No results for input(s): CKTOTAL, CKMB, CKMBINDEX, TROPONINI in the last 168 hours. CBG: Recent Labs  Lab 10/30/19 0608 10/30/19 1107 10/30/19 1615 10/30/19 2121 10/31/19 0655  GLUCAP 172* 142* 148* 161* 93    Iron Studies: No results for input(s): IRON, TIBC, TRANSFERRIN, FERRITIN in the last 72 hours. Studies/Results: No results found. Marland Kitchen aspirin EC  81 mg Oral Daily  . bisacodyl  10 mg Rectal Once  . brimonidine  1 drop Left Eye BID  . calcium acetate  1,334 mg Oral TID WC  . Chlorhexidine Gluconate Cloth  6 each Topical Daily  . Chlorhexidine Gluconate Cloth  6 each Topical Q0600  . darbepoetin (ARANESP) injection - NON-DIALYSIS  100 mcg Subcutaneous Q Mon-1800  . dorzolamide-timolol  1 drop Left Eye BID  . gatifloxacin  1 drop Left Eye QID  . hydrALAZINE  25 mg Oral TID  . insulin aspart  0-9 Units Subcutaneous TID WC  . latanoprost  1 drop Left Eye QHS  . mupirocin ointment  1 application Nasal BID  . senna-docusate  1 tablet Oral BID    BMET    Component Value Date/Time   NA 135 10/31/2019 0302   K 3.7 10/31/2019 0302   CL 96 (L) 10/31/2019 0302   CO2 25 10/31/2019 0302   GLUCOSE 118 (H) 10/31/2019 0302    BUN 71 (H) 10/31/2019 0302   CREATININE 4.78 (H) 10/31/2019 0302   CALCIUM 8.4 (L) 10/31/2019 0302   GFRNONAA 13 (L) 10/31/2019 0302   GFRAA 16 (L) 10/31/2019 0302   CBC    Component Value Date/Time   WBC 11.0 (H) 10/30/2019 0621   RBC 2.72 (L) 10/30/2019 0621   HGB 7.7 (L) 10/30/2019 0621   HCT 23.6 (L) 10/30/2019 0621   PLT 201 10/30/2019 0621   MCV 86.8 10/30/2019 0621   MCH 28.3 10/30/2019 0621   MCHC 32.6 10/30/2019 0621   RDW 17.5 (H) 10/30/2019 0621   LYMPHSABS 0.9 10/28/2019 0753   MONOABS 1.4 (H) 10/28/2019 0753   EOSABS 0.0 10/28/2019 0753   BASOSABS 0.0 10/28/2019 0753    Assessment/Plan:  1. AKI/CKD stage IV- in setting of acute on chronic diastolic CHF and diuresis. His baselineis3-3.5 and is followed by Dr. Carolin Sicks at [facility]. BUN/Crt climbed and volume status worsened despite diuresis. Started dialysis and tolerating well.  Status post 2 treatments now planning to continue Monday Wednesday Friday dialysis.  Some edema on exam so would benefit from increase ultrafiltration. Difficulties with insight and palliative care is following.  2. Dialysis Access: [facility] on 5/13. AVF creation 10/29/2019.  Plan to follow-up with vascular in 6 weeks. Will CTM 3. Acute on chronic diastolic CHF-not responding to high dose diuretics- Uf with dialysis 4. Urinary retention- s/p foley catheter and mayneed to be discharged with this. Given minimal UOP could consider removal prior to DC. 5. Anemia of CKD stage IV- hgbdropped to 6.8 and received a unit of PRBC's at that time.Received 2 doses IV feraheme  on 5/1 and Aranesp 40 mcg on 4/30. ESA increased to 120mcg on 5/10. Will CTM. Hgb stable but not optimal 6. DM type 2 with hyperglycemia - per primary svc 7. Bones-phosphorus is improving with dialysis.  Continue PhosLo at current dose. - PTH 172 8. Hyponatremia-resolved today.  Likely hypervolemic in nature.  Ultrafiltration with dialysis   [facility] [phone]

## 2019-10-31 NOTE — Progress Notes (Signed)
PROGRESS NOTE  Christopher Davila LFY:[id number] DOB: 1971/03/28   PCP: Marliss Coots, NP  Patient is from: Home.  Lives with his cousin.  DOA: 10/13/2019 LOS: 71  Brief Narrative / Interim history: 49 year old male with history of hypertension, diabetes mellitus/gastroparesis/blindness, CKD stage IV, chronic diastolic CHF admitted with diarrhea, weakness and failure to thrive. He was  recently admitted 4/10-12 for acute on chronic diastolic CHF.  He returned to the ER on 4/20 but did not qualify for hospitalization at that time and he has been not able to be placed in a SNF. In the ED had severe acidosis with a gap, given bicarb IV fluids and was admitted. Diarrhea has since resolved C. difficile negative GI panel negative.  Patient developed progressive fluid overload with lower leg edema acute on chronic diastolic CHF. Was started on lasix drip and metolazone but remained fluid overloaded with oliguria and uremic symptoms.   Patient had [facility] and started HD on 10/28/2019.  Had right arm aVF on 5/14.   Subjective: Seen and examined earlier this morning.  No major events overnight of this morning.  Says "so-so" when asked how he he feels this morning.  No specific complaint other than the lack of sleep.  He denies pain, dyspnea or GI symptoms.  Objective: Vitals:   10/31/19 0530 10/31/19 0600 10/31/19 0615 10/31/19 0928  BP: 134/74 137/76 135/80 139/84  Pulse: 74 73 73 75  Resp:   (!) 23 16  Temp:   97.8 F (36.6 C)   TempSrc:   Oral   SpO2:   98% 100%  Weight:      Height:        Intake/Output Summary (Last 24 hours) at 10/31/2019 1031 Last data filed at 10/31/2019 0900 Gross per 24 hour  Intake 920 ml  Output 1999 ml  Net -1079 ml   Filed Weights   10/29/19 0331 10/29/19 0400 10/30/19 0440  Weight: 84.2 kg 80.7 kg 80.7 kg    Examination:  GENERAL: No apparent distress.  Nontoxic. HEENT: MMM.  Legally blind.  Hearing grossly intact. NECK: Supple.  No apparent JVD.  RESP: 98%  on 2 L.  No IWOB.  Fair aeration bilaterally. CVS:  RRR. Heart sounds normal.  ABD/GI/GU: BS+. Abd soft, NTND.  Indwelling Foley in place. MSK/EXT:  Moves extremities.  2+ BLE edema.  Scrotal and penile edema. SKIN: no apparent skin lesion or wound NEURO: Awake, alert and oriented to self, place and situation but not time.  No apparent focal neuro deficit. PSYCH: Calm. Normal affect.  Procedures:  None  Microbiology summarized: 4/28-influenza PCR and COVID-19 PCR negative. 4/28-C. difficile negative. 4/28-GI panel negative. 5/12-blood and urine cultures ordered.  Assessment & Plan: Acute respiratory failure with hypoxia due to acute on chronic diastolic CHF/fluid overload-improved. -Wean oxygen to room air -Encourage incentive spirometry -Manage CHF and fluid overload as below  Acute on chronic diastolic CHF/fluid overload in the setting of renal failure-did not respond to diuretics.  Still with significant edema.  -Getting ultrafiltration with dialysis  AKI on CKD-4 progressing to ESRD/oliguria/uremia-improved -[facility] and initial HD on 5/13.  Right arm aVF on 5/14  Hypervolemic hyponatremia due to CHF and renal failure: Resolved.  Anion gap metabolic acidosis due to renal failure-resolved.  Bone mineral disorder/hyperphosphatemia: Improved -Per nephrology.  Anemia of renal disease-H&H relatively stable after 1 unit -IV iron and Aranesp per nephrology  Acute metabolic encephalopathy: Likely due to uremia.  Resolved. -Continue holding gabapentin -Frequent reorientation and delirium precautions  Acute urine retention: Indwelling Foley catheter placed here. -Continue indwelling Foley for now given significant penile edema.  Hypothermia: Resolved. -Work-up not revealing for specific etiology.  Controlled DM-2: A1c 6.4 09/19/2019 Recent Labs    10/30/19 1615 10/30/19 2121 10/31/19 0655  GLUCAP 148* 161* 93  -Continue SSI and Lantus 5 units daily  Essential  hypertension: Normotensive -Hold morning meds  Hyperlipidemia: continue statin.  Diarrhea: Resolved.  Generalized weakness/Debility:  -Therapy recommended SNF.  Goal of care: Significant comorbidity, legally blind with social barrier now on dialysis.  Full code. -Palliative care consulted.          DVT prophylaxis: Subcu heparin Code Status: Full code Family Communication: Updated [name], patient's cousin listed as patient's contact on 5/12 Status is: Inpatient  Remains inpatient appropriate because: Fluid overload/CHF exacerbation/ESRD requiring HD. Patient needs SNF and outpt HD spot  Dispo: The patient is from: Home              Anticipated d/c is to: SNF              Anticipated d/c date is: > 3 days              Patient currently is not medically stable to d/c.        Consultants:  Nephrology IR VVS PMT   Sch Meds:  Scheduled Meds: . aspirin EC  81 mg Oral Daily  . bisacodyl  10 mg Rectal Once  . brimonidine  1 drop Left Eye BID  . calcium acetate  1,334 mg Oral TID WC  . Chlorhexidine Gluconate Cloth  6 each Topical Daily  . Chlorhexidine Gluconate Cloth  6 each Topical Q0600  . darbepoetin (ARANESP) injection - NON-DIALYSIS  100 mcg Subcutaneous Q Mon-1800  . dorzolamide-timolol  1 drop Left Eye BID  . gatifloxacin  1 drop Left Eye QID  . hydrALAZINE  25 mg Oral TID  . insulin aspart  0-9 Units Subcutaneous TID WC  . latanoprost  1 drop Left Eye QHS  . mupirocin ointment  1 application Nasal BID  . senna-docusate  1 tablet Oral BID   Continuous Infusions: . sodium chloride 250 mL (10/16/19 0909)   PRN Meds:.sodium chloride, acetaminophen **OR** acetaminophen, lidocaine, ondansetron (ZOFRAN) IV  Antimicrobials: Anti-infectives (From admission, onward)   Start     Dose/Rate Route Frequency Ordered Stop   10/29/19 0600  cefUROXime (ZINACEF) 1.5 g in sodium chloride 0.9 % 100 mL IVPB     1.5 g 200 mL/hr over 30 Minutes Intravenous On call to  O.R. 10/28/19 0727 10/29/19 1232   10/28/19 0839  ceFAZolin (ANCEF) IVPB 2g/100 mL premix     over 30 Minutes  Continuous PRN 10/28/19 0846 10/28/19 0909   10/28/19 0826  ceFAZolin (ANCEF) 2-4 GM/100ML-% IVPB    Note to Pharmacy: Domenick Bookbinder   : cabinet override      10/28/19 0826 10/28/19 2044   10/28/19 0800  ceFAZolin (ANCEF) IVPB 2g/100 mL premix     2 g 200 mL/hr over 30 Minutes Intravenous To Radiology 10/28/19 0223 10/29/19 0800   10/28/19 0000  ceFAZolin (ANCEF) IVPB 2g/100 mL premix  Status:  Discontinued     2 g 200 mL/hr over 30 Minutes Intravenous To Radiology 10/27/19 1333 10/28/19 0223       I have personally reviewed the following labs and images: CBC: Recent Labs  Lab 10/25/19 0532 10/25/19 0532 10/26/19 8250 10/27/19 0719 10/28/19 0753 10/29/19 0609 10/30/19 0621  WBC 6.6   < >  5.2 10.3 12.0* 13.0* 11.0*  NEUTROABS 4.7  --  3.6 8.5* 9.6*  --   --   HGB 8.3*   < > 8.4* 8.5* 8.3* 8.1* 7.7*  HCT 26.9*   < > 26.9* 27.1* 25.2* 25.0* 23.6*  MCV 92.1   < > 91.5 90.6 88.4 87.7 86.8  PLT 206   < > 215 213 222 210 201   < > = values in this interval not displayed.   BMP &GFR Recent Labs  Lab 10/28/19 0753 10/29/19 0158 10/29/19 0609 10/30/19 0621 10/31/19 0302  NA 132* 132* 133* 133* 135  K 3.9 4.0 3.7 3.9 3.7  CL 93* 92* 97* 96* 96*  CO2 20* 22 22 22 25   GLUCOSE 102* 158* 137* 164* 118*  BUN 111* 120* 86* 91* 71*  CREATININE 7.00* 7.25* 5.69* 6.13* 4.78*  CALCIUM 8.7* 8.5* 8.4* 8.2* 8.4*  MG  --   --   --  2.2  --   PHOS 9.1* 9.8* 7.2* 7.4* 5.7*   Estimated Creatinine Clearance: 17.9 mL/min (A) (by C-G formula based on SCr of 4.78 mg/dL (H)). Liver & Pancreas: Recent Labs  Lab 10/25/19 0751 10/26/19 5597 10/28/19 0753 10/29/19 0158 10/29/19 0609 10/30/19 0621 10/31/19 0302  AST 52*  --   --   --   --   --   --   ALT 135*  --   --   --   --   --   --   ALKPHOS 241*  --   --   --   --   --   --   BILITOT 1.1  --   --   --   --   --   --     PROT 6.4*  --   --   --   --   --   --   ALBUMIN 2.5*   < > 2.4* 2.3* 2.2* 2.4* 2.4*   < > = values in this interval not displayed.   No results for input(s): LIPASE, AMYLASE in the last 168 hours. Recent Labs  Lab 10/28/19 0753  AMMONIA 43*   Diabetic: No results for input(s): HGBA1C in the last 72 hours. Recent Labs  Lab 10/30/19 0608 10/30/19 1107 10/30/19 1615 10/30/19 2121 10/31/19 0655  GLUCAP 172* 142* 148* 161* 93   Cardiac Enzymes: No results for input(s): CKTOTAL, CKMB, CKMBINDEX, TROPONINI in the last 168 hours. No results for input(s): PROBNP in the last 8760 hours. Coagulation Profile: No results for input(s): INR, PROTIME in the last 168 hours. Thyroid Function Tests: No results for input(s): TSH, T4TOTAL, FREET4, T3FREE, THYROIDAB in the last 72 hours. Lipid Profile: No results for input(s): CHOL, HDL, LDLCALC, TRIG, CHOLHDL, LDLDIRECT in the last 72 hours. Anemia Panel: No results for input(s): VITAMINB12, FOLATE, FERRITIN, TIBC, IRON, RETICCTPCT in the last 72 hours. Urine analysis:    Component Value Date/Time   COLORURINE AMBER (A) 10/27/2019 1559   APPEARANCEUR TURBID (A) 10/27/2019 1559   LABSPEC 1.014 10/27/2019 1559   PHURINE 5.0 10/27/2019 1559   GLUCOSEU 50 (A) 10/27/2019 1559   HGBUR LARGE (A) 10/27/2019 1559   BILIRUBINUR NEGATIVE 10/27/2019 1559   KETONESUR NEGATIVE 10/27/2019 1559   PROTEINUR 100 (A) 10/27/2019 1559   UROBILINOGEN 0.2 12/13/2014 2056   NITRITE NEGATIVE 10/27/2019 1559   LEUKOCYTESUR LARGE (A) 10/27/2019 1559   Sepsis Labs: Invalid input(s): PROCALCITONIN, [place]  Microbiology: Recent Results (from the past 240 hour(s))  Culture, Urine  Status: Abnormal   Collection Time: 10/27/19 11:30 AM   Specimen: Urine, Catheterized  Result Value Ref Range Status   Specimen Description URINE, CATHETERIZED  Final   Special Requests   Final    NONE Performed at [facility], [address] [place], [place]  [address]    Culture >=100,000 COLONIES/mL ENTEROCOCCUS FAECALIS (A)  Final   Report Status 10/29/2019 FINAL  Final   Organism ID, Bacteria ENTEROCOCCUS FAECALIS (A)  Final      Susceptibility   Enterococcus faecalis - MIC*    AMPICILLIN <=2 SENSITIVE Sensitive     NITROFURANTOIN <=16 SENSITIVE Sensitive     VANCOMYCIN 1 SENSITIVE Sensitive     * >=100,000 COLONIES/mL ENTEROCOCCUS FAECALIS  Culture, blood (routine x 2)     Status: None (Preliminary result)   Collection Time: 10/27/19 12:08 PM   Specimen: BLOOD RIGHT HAND  Result Value Ref Range Status   Specimen Description BLOOD RIGHT HAND  Final   Special Requests   Final    BOTTLES DRAWN AEROBIC AND ANAEROBIC Blood Culture adequate volume   Culture   Final    NO GROWTH 4 DAYS Performed at [facility], [place] [address] [place], [place] [address]    Report Status PENDING  Incomplete  Culture, blood (routine x 2)     Status: None (Preliminary result)   Collection Time: 10/27/19 12:08 PM   Specimen: BLOOD RIGHT HAND  Result Value Ref Range Status   Specimen Description BLOOD RIGHT HAND  Final   Special Requests   Final    BOTTLES DRAWN AEROBIC AND ANAEROBIC Blood Culture adequate volume   Culture   Final    NO GROWTH 4 DAYS Performed at [facility], [place] [address] [place], [place] [address]    Report Status PENDING  Incomplete  Surgical pcr screen     Status: Abnormal   Collection Time: 10/28/19 11:30 PM   Specimen: Nasal Mucosa; Nasal Swab  Result Value Ref Range Status   MRSA, PCR POSITIVE (A) NEGATIVE Final    Comment: RESULT CALLED TO, READ BACK BY AND VERIFIED WITH: NITURADA,L RN 10/29/2019 AT 0259 SKEEN,P    Staphylococcus aureus POSITIVE (A) NEGATIVE Final    Comment: (NOTE) The Xpert SA Assay (FDA approved for NASAL specimens in patients 47 years of age and older), is one component of a comprehensive surveillance program. It is not intended to diagnose infection nor to guide or monitor treatment.      Radiology Studies: No results found.  Shalyn Koral T. [place]  If 7PM-7AM, please contact night-coverage [email or web address] Password [facility] 10/31/2019, 10:31 AM

## 2019-11-01 DIAGNOSIS — G4700 Insomnia, unspecified: Secondary | ICD-10-CM

## 2019-11-01 DIAGNOSIS — E876 Hypokalemia: Secondary | ICD-10-CM

## 2019-11-01 LAB — GLUCOSE, CAPILLARY
Glucose-Capillary: 139 mg/dL — ABNORMAL HIGH (ref 70–99)
Glucose-Capillary: 131 mg/dL — ABNORMAL HIGH (ref 70–99)
Glucose-Capillary: 238 mg/dL — ABNORMAL HIGH (ref 70–99)
Glucose-Capillary: 198 mg/dL — ABNORMAL HIGH (ref 70–99)

## 2019-11-01 LAB — RENAL FUNCTION PANEL
Albumin: 2.2 g/dL — ABNORMAL LOW (ref 3.5–5.0)
Anion gap: 12 (ref 5–15)
BUN: 53 mg/dL — ABNORMAL HIGH (ref 6–20)
CO2: 24 mmol/L (ref 22–32)
Calcium: 8.4 mg/dL — ABNORMAL LOW (ref 8.9–10.3)
Chloride: 99 mmol/L (ref 98–111)
Creatinine, Ser: 4.55 mg/dL — ABNORMAL HIGH (ref 0.61–1.24)
GFR calc Af Amer: 17 mL/min — ABNORMAL LOW (ref >60)
GFR calc non Af Amer: 14 mL/min — ABNORMAL LOW (ref >60)
Glucose, Bld: 136 mg/dL — ABNORMAL HIGH (ref 70–99)
Phosphorus: 5 mg/dL — ABNORMAL HIGH (ref 2.5–4.6)
Potassium: 3.4 mmol/L — ABNORMAL LOW (ref 3.5–5.1)
Sodium: 135 mmol/L (ref 135–145)

## 2019-11-01 LAB — CULTURE, BLOOD (ROUTINE X 2)
Culture: NO GROWTH
Culture: NO GROWTH
Special Requests: ADEQUATE
Special Requests: ADEQUATE

## 2019-11-01 MED ORDER — HYDROMORPHONE HCL 2 MG PO TABS
1.0000 mg | ORAL_TABLET | Freq: Four times a day (QID) | ORAL | Status: AC | PRN
  Administered 2019-11-01: 1 mg via ORAL
  Administered 2019-11-02: 2 mg via ORAL
  Filled 2019-11-01 (×2): qty 1

## 2019-11-01 MED ORDER — TRAZODONE HCL 50 MG PO TABS: 50.0000 mg | ORAL_TABLET | Freq: Every evening | ORAL | Status: DC | PRN

## 2019-11-01 NOTE — Progress Notes (Signed)
Occupational Therapy Treatment Patient Details Name: Christopher Davila MRN: [id number] DOB: Oct 15, 1970 Today's Date: 11/01/2019    History of present illness Pt is an 49 y.o. male with PMH DM, HTN, CKD stage IIIb, peripheral neuropathy, diabetic retinopathy, bilateral vision loss, glaucoma, chronic diastolic heart failure who presented to ED with increased weakness.   OT comments  Pt prgoressing with OOB tasks. Pt unaware that scrotal sling was not present. Pt sitting in own feces when found and was unsure if he had called for assist. Pt performing bed mobility with minA for LB movement to EOB and trunk elevation. Pt stood with RW for pericare and took steps to [facility]. Pt STS x2 times with modA overall for power up. OTR denied need for scrotal sling due to pt unable to donn/doff himself, staff threw away sling last week and pt incontinent. Pt requires cues for hand placement and momentum encouraged. pt requiring encouragement to perform tasks for himself when supervised as he is solely reliant on staff. Pt continues to require skilled OT. OT following acutely.     Follow Up Recommendations  Home health OT;SNF;Supervision/Assistance - 24 hour(If pt can have assist at home, pt could go home.)    Equipment Recommendations  3 in 1 bedside commode    Recommendations for Other Services      Precautions / Restrictions Precautions Precautions: Fall Precaution Comments: blind; scrotal swelling Restrictions Weight Bearing Restrictions: No       Mobility Bed Mobility Overal bed mobility: Needs Assistance Bed Mobility: Supine to Sit;Sit to Supine     Supine to sit: Min assist Sit to supine: Min assist   General bed mobility comments: MinA for BLE management and Trunk elevation  Transfers Overall transfer level: Needs assistance Equipment used: Rolling walker (2 wheeled) Transfers: Sit to/from Stand Sit to Stand: Mod assist         General transfer comment: cues for hand placement and  momentum encouraged. pt requiring encouragement to "do for himself" as he has been relying on staff for all ADL and mobility.    Balance Overall balance assessment: Needs assistance Sitting-balance support: Feet supported Sitting balance-Leahy Scale: Fair Sitting balance - Comments: use of BUEs to keep self upright   Standing balance support: Bilateral upper extremity supported Standing balance-Leahy Scale: Poor Standing balance comment: standing for pericare x1 mins                           ADL either performed or assessed with clinical judgement   ADL Overall ADL's : Needs assistance/impaired Eating/Feeding: Set up;Sitting Eating/Feeding Details (indicate cue type and reason): set-upA with fork and coffee near right side,                                 Functional mobility during ADLs: Moderate assistance;Rolling walker;Cueing for safety;Cueing for sequencing General ADL Comments: Pt unaware that scrotal sling was not present. Pt sitting in own feces when found and was unsure if he had called for assist. Pt performing bed mobility with minA for LB movement to EOB and trunk elevation. Pt stood with RW for pericare and took steps to [facility]. Pt STS x2 times with modA overall for power up. OTR denied need for scrotal sling due to pt unable to donn/doff himself, staff threw away sling last week and pt incontinent.      Vision   Vision Assessment?: Vision impaired- to  be further tested in functional context Additional Comments: blind   Perception     Praxis      Cognition Arousal/Alertness: Awake/alert Behavior During Therapy: Flat affect Overall Cognitive Status: Impaired/Different from baseline Area of Impairment: Following commands;Problem solving;Safety/judgement                       Following Commands: Follows one step commands with increased time;Follows multi-step commands inconsistently Safety/Judgement: Decreased awareness of  safety;Decreased awareness of deficits   Problem Solving: Slow processing;Requires verbal cues;Requires tactile cues General Comments: increased processing time and cues needed        Exercises     Shoulder Instructions       General Comments VSS on RA.    Pertinent Vitals/ Pain       Pain Assessment: Faces Faces Pain Scale: Hurts a little bit Pain Location: scrotum, penis Pain Intervention(s): Monitored during session  Home Living                                          Prior Functioning/Environment              Frequency  Min 2X/week        Progress Toward Goals  OT Goals(current goals can now be found in the care plan section)  Progress towards OT goals: Progressing toward goals  Acute Rehab OT Goals Patient Stated Goal: to go home OT Goal Formulation: With patient Time For Goal Achievement: 11/18/19 Potential to Achieve Goals: Good ADL Goals Pt Will Perform Grooming: with min assist;standing Pt Will Perform Lower Body Bathing: with min assist;sit to/from stand;sitting/lateral leans Pt Will Perform Lower Body Dressing: with min assist;sitting/lateral leans;sit to/from stand Pt Will Transfer to Toilet: with min assist;squat pivot transfer;bedside commode Additional ADL Goal #1: Pt will engage in standing BADL task at sink with min A for 5-7 minutes Additional ADL Goal #2: Pt will implement visual compensatory strategies to BADL task with min A  Plan Discharge plan needs to be updated    Co-evaluation                 AM-PAC OT "6 Clicks" Daily Activity     Outcome Measure   Help from another person eating meals?: A Little Help from another person taking care of personal grooming?: A Little Help from another person toileting, which includes using toliet, bedpan, or urinal?: A Lot Help from another person bathing (including washing, rinsing, drying)?: A Lot Help from another person to put on and taking off regular upper body  clothing?: A Lot Help from another person to put on and taking off regular lower body clothing?: A Lot 6 Click Score: 14    End of Session Equipment Utilized During Treatment: Gait belt;Rolling walker  OT Visit Diagnosis: Repeated falls (R29.6);Pain;Other symptoms and signs involving cognitive function Pain - part of body: Leg   Activity Tolerance Patient tolerated treatment well   Patient Left in bed;with call bell/phone within reach;with bed alarm set   Nurse Communication Mobility status        Time: 9702-6378 OT Time Calculation (min): 35 min  Charges: OT General Charges $OT Visit: 1 Visit OT Treatments $Self Care/Home Management : 8-22 mins $Therapeutic Activity: 8-22 mins  Jefferey Pica, OTR/L Acute Rehabilitation Services Pager: [phone] Office: [phone]   Rushil Kimbrell C 11/01/2019, 5:01 PM

## 2019-11-01 NOTE — Progress Notes (Signed)
This RN attempted to wean patient to room air. Oxygen saturation while on room air was 87%, patient lying in bed and requested for oxygen to be turned back on because he felt he "couldn't breath good". RN applied 2 liters via nasal cannula. Current oxygen saturation is 96% on 2 liters via nasal cannula; patient states he can breath better with oxygen on.

## 2019-11-01 NOTE — Progress Notes (Signed)
PROGRESS NOTE  Christopher Davila MWN:[id number] DOB: February 24, 1971   PCP: Christopher Coots, NP  Patient is from: Home.  Lives with his cousin.  DOA: 10/13/2019 LOS: 71  Brief Narrative / Interim history: 49 year old male with history of hypertension, diabetes mellitus/gastroparesis/blindness, CKD stage IV, chronic diastolic CHF admitted with diarrhea, weakness and failure to thrive. He was  recently admitted 4/10-12 for acute on chronic diastolic CHF.  He returned to the ER on 4/20 but did not qualify for hospitalization at that time and he has been not able to be placed in a SNF. In the ED had severe acidosis with a gap, given bicarb IV fluids and was admitted. Diarrhea has since resolved C. difficile negative GI panel negative.  Patient developed progressive fluid overload with lower leg edema acute on chronic diastolic CHF. Was started on lasix drip and metolazone but remained fluid overloaded with oliguria and uremic symptoms.   Patient had [place] and started HD on 10/28/2019.  Had right arm aVF on 5/14.   Subjective: Seen and examined earlier this morning.  No major events overnight of this morning.  No complaints other than lack of sleep.  He denies chest pain, dyspnea, GI or UTI symptoms.  Objective: Vitals:   10/31/19 1942 11/01/19 0319 11/01/19 0327 11/01/19 1007  BP: 120/74 123/77  120/70  Pulse: 74 73  73  Resp: 18 18  18   Temp: 97.8 F (36.6 C) 98.3 F (36.8 C)  98.5 F (36.9 C)  TempSrc: Oral Oral  Oral  SpO2: 92% 99%  96%  Weight:   78 kg   Height:        Intake/Output Summary (Last 24 hours) at 11/01/2019 1119 Last data filed at 11/01/2019 0326 Gross per 24 hour  Intake 480 ml  Output 202 ml  Net 278 ml   Filed Weights   10/29/19 0400 10/30/19 0440 11/01/19 0327  Weight: 80.7 kg 80.7 kg 78 kg    Examination:  GENERAL: No apparent distress.  Nontoxic. HEENT: MMM.  Legally blind.  Hearing grossly intact. NECK: Supple.  No apparent JVD.  RESP: 99% on 2 L.  No IWOB.   Fair aeration bilaterally. CVS:  RRR. Heart sounds normal.  ABD/GI/GU: BS+. Abd soft, NTND.  Indwelling Foley MSK/EXT:  Moves extremities.  2+ BLE edema.  Scrotal and penile edema. SKIN: no apparent skin lesion or wound NEURO: Awake, alert and oriented fairly.  No apparent focal neuro deficit. PSYCH: Calm. Normal affect.  Procedures:  None  Microbiology summarized: 4/28-influenza PCR and COVID-19 PCR negative. 4/28-C. difficile negative. 4/28-GI panel negative. 5/12-blood and urine cultures ordered.  Assessment & Plan: Acute respiratory failure with hypoxia due to acute on chronic diastolic CHF/fluid overload-improved. -Wean oxygen to room air -Encourage incentive spirometry -Manage CHF and fluid overload as below  Acute on chronic diastolic CHF/fluid overload in the setting of renal failure-did not respond to diuretics.  Still with significant edema.  -Getting ultrafiltration with dialysis  AKI on CKD-4 progressing to ESRD/oliguria/uremia-improved -[facility] and initial HD on 5/13.  Right arm aVF on 5/14.  Follow-up with [facility] in 6 weeks  Hypervolemic hyponatremia due to CHF and renal failure: Resolved.  Anion gap metabolic acidosis due to renal failure-resolved.  Bone mineral disorder/hyperphosphatemia: Improved -Per nephrology.  Hypokalemia: K3.4. -Per nephrology  Anemia of renal disease-H&H relatively stable after 1 unit -IV iron and Aranesp per nephrology  Acute metabolic encephalopathy: Likely due to uremia.  Resolved. -Continue holding gabapentin -Frequent reorientation and delirium precautions  Acute urine  retention: Indwelling Foley catheter placed here. -Continue indwelling Foley for now given significant penile edema.  Hypothermia: Resolved. -Work-up not revealing for specific etiology.  Controlled DM-2: A1c 6.4 09/19/2019 Recent Labs    10/31/19 1611 10/31/19 2126 11/01/19 0625  GLUCAP 181* 167* 139*  -Continue SSI and Lantus 5 units daily  Essential  hypertension: Normotensive -Hold morning meds  Hyperlipidemia: continue statin.  Diarrhea: Resolved.  Generalized weakness/Debility:  -Therapy recommended SNF.  Insomnia -Will try low dose trazodone tonight  Goal of care: Significant comorbidity, legally blind with social barrier now on dialysis.  Full code. -Palliative care consulted.          DVT prophylaxis: Subcu heparin Code Status: Full code Family Communication: Updated Christopher Davila, patient's cousin listed as patient's contact on 5/12 Status is: Inpatient  Remains inpatient appropriate because: Fluid overload/CHF exacerbation/ESRD requiring HD. Patient needs SNF and outpt HD spot  Dispo: The patient is from: Home              Anticipated d/c is to: SNF              Anticipated d/c date is: 2 days              Patient currently is not medically stable to d/c.        Consultants:  Nephrology IR VVS PMT   Sch Meds:  Scheduled Meds: . aspirin EC  81 mg Oral Daily  . bisacodyl  10 mg Rectal Once  . brimonidine  1 drop Left Eye BID  . calcium acetate  1,334 mg Oral TID WC  . Chlorhexidine Gluconate Cloth  6 each Topical Q0600  . darbepoetin (ARANESP) injection - NON-DIALYSIS  100 mcg Subcutaneous Q Mon-1800  . dorzolamide-timolol  1 drop Left Eye BID  . gatifloxacin  1 drop Left Eye QID  . hydrALAZINE  25 mg Oral TID  . insulin aspart  0-9 Units Subcutaneous TID WC  . latanoprost  1 drop Left Eye QHS  . mupirocin ointment  1 application Nasal BID  . senna-docusate  1 tablet Oral BID   Continuous Infusions: . sodium chloride 250 mL (10/16/19 0909)   PRN Meds:.sodium chloride, acetaminophen **OR** acetaminophen, lidocaine, ondansetron (ZOFRAN) IV  Antimicrobials: Anti-infectives (From admission, onward)   Start     Dose/Rate Route Frequency Ordered Stop   10/29/19 0600  cefUROXime (ZINACEF) 1.5 g in sodium chloride 0.9 % 100 mL IVPB     1.5 g 200 mL/hr over 30 Minutes Intravenous On call to O.R.  10/28/19 0727 10/29/19 1232   10/28/19 0839  ceFAZolin (ANCEF) IVPB 2g/100 mL premix     over 30 Minutes  Continuous PRN 10/28/19 0846 10/28/19 0909   10/28/19 0826  ceFAZolin (ANCEF) 2-4 GM/100ML-% IVPB    Note to Pharmacy: Christopher Davila   : cabinet override      10/28/19 0826 10/28/19 2044   10/28/19 0800  ceFAZolin (ANCEF) IVPB 2g/100 mL premix     2 g 200 mL/hr over 30 Minutes Intravenous To Radiology 10/28/19 0223 10/29/19 0800   10/28/19 0000  ceFAZolin (ANCEF) IVPB 2g/100 mL premix  Status:  Discontinued     2 g 200 mL/hr over 30 Minutes Intravenous To Radiology 10/27/19 1333 10/28/19 0223       I have personally reviewed the following labs and images: CBC: Recent Labs  Lab 10/26/19 0611 10/27/19 0719 10/28/19 0753 10/29/19 0609 10/30/19 0621  WBC 5.2 10.3 12.0* 13.0* 11.0*  NEUTROABS 3.6 8.5* 9.6*  --   --  HGB 8.4* 8.5* 8.3* 8.1* 7.7*  HCT 26.9* 27.1* 25.2* 25.0* 23.6*  MCV 91.5 90.6 88.4 87.7 86.8  PLT 215 213 222 210 201   BMP &GFR Recent Labs  Lab 10/29/19 0158 10/29/19 0609 10/30/19 0621 10/31/19 0302 11/01/19 0418  NA 132* 133* 133* 135 135  K 4.0 3.7 3.9 3.7 3.4*  CL 92* 97* 96* 96* 99  CO2 22 22 22 25 24   GLUCOSE 158* 137* 164* 118* 136*  BUN 120* 86* 91* 71* 53*  CREATININE 7.25* 5.69* 6.13* 4.78* 4.55*  CALCIUM 8.5* 8.4* 8.2* 8.4* 8.4*  MG  --   --  2.2  --   --   PHOS 9.8* 7.2* 7.4* 5.7* 5.0*   Estimated Creatinine Clearance: 18.5 mL/min (A) (by C-G formula based on SCr of 4.55 mg/dL (H)). Liver & Pancreas: Recent Labs  Lab 10/29/19 0158 10/29/19 0609 10/30/19 0621 10/31/19 0302 11/01/19 0418  ALBUMIN 2.3* 2.2* 2.4* 2.4* 2.2*   No results for input(s): LIPASE, AMYLASE in the last 168 hours. Recent Labs  Lab 10/28/19 0753  AMMONIA 43*   Diabetic: No results for input(s): HGBA1C in the last 72 hours. Recent Labs  Lab 10/31/19 0655 10/31/19 1107 10/31/19 1611 10/31/19 2126 11/01/19 0625  GLUCAP 93 174* 181* 167* 139*    Cardiac Enzymes: No results for input(s): CKTOTAL, CKMB, CKMBINDEX, TROPONINI in the last 168 hours. No results for input(s): PROBNP in the last 8760 hours. Coagulation Profile: No results for input(s): INR, PROTIME in the last 168 hours. Thyroid Function Tests: No results for input(s): TSH, T4TOTAL, FREET4, T3FREE, THYROIDAB in the last 72 hours. Lipid Profile: No results for input(s): CHOL, HDL, LDLCALC, TRIG, CHOLHDL, LDLDIRECT in the last 72 hours. Anemia Panel: No results for input(s): VITAMINB12, FOLATE, FERRITIN, TIBC, IRON, RETICCTPCT in the last 72 hours. Urine analysis:    Component Value Date/Time   COLORURINE AMBER (A) 10/27/2019 1559   APPEARANCEUR TURBID (A) 10/27/2019 1559   LABSPEC 1.014 10/27/2019 1559   PHURINE 5.0 10/27/2019 1559   GLUCOSEU 50 (A) 10/27/2019 1559   HGBUR LARGE (A) 10/27/2019 1559   BILIRUBINUR NEGATIVE 10/27/2019 1559   KETONESUR NEGATIVE 10/27/2019 1559   PROTEINUR 100 (A) 10/27/2019 1559   UROBILINOGEN 0.2 12/13/2014 2056   NITRITE NEGATIVE 10/27/2019 1559   LEUKOCYTESUR LARGE (A) 10/27/2019 1559   Sepsis Labs: Invalid input(s): PROCALCITONIN, [place]  Microbiology: Recent Results (from the past 240 hour(s))  Culture, Urine     Status: Abnormal   Collection Time: 10/27/19 11:30 AM   Specimen: Urine, Catheterized  Result Value Ref Range Status   Specimen Description URINE, CATHETERIZED  Final   Special Requests   Final    NONE Performed at [facility], [address] [place], [place] [address]    Culture >=100,000 COLONIES/mL ENTEROCOCCUS FAECALIS (A)  Final   Report Status 10/29/2019 FINAL  Final   Organism ID, Bacteria ENTEROCOCCUS FAECALIS (A)  Final      Susceptibility   Enterococcus faecalis - MIC*    AMPICILLIN <=2 SENSITIVE Sensitive     NITROFURANTOIN <=16 SENSITIVE Sensitive     VANCOMYCIN 1 SENSITIVE Sensitive     * >=100,000 COLONIES/mL ENTEROCOCCUS FAECALIS  Culture, blood (routine x 2)     Status: None    Collection Time: 10/27/19 12:08 PM   Specimen: BLOOD RIGHT HAND  Result Value Ref Range Status   Specimen Description BLOOD RIGHT HAND  Final   Special Requests   Final    BOTTLES  DRAWN AEROBIC AND ANAEROBIC Blood Culture adequate volume   Culture   Final    NO GROWTH 5 DAYS Performed at [facility], [place] [address], [place], [place] [address]    Report Status 11/01/2019 FINAL  Final  Culture, blood (routine x 2)     Status: None   Collection Time: 10/27/19 12:08 PM   Specimen: BLOOD RIGHT HAND  Result Value Ref Range Status   Specimen Description BLOOD RIGHT HAND  Final   Special Requests   Final    BOTTLES DRAWN AEROBIC AND ANAEROBIC Blood Culture adequate volume   Culture   Final    NO GROWTH 5 DAYS Performed at [facility], [place] [address] [place], [place] [address]    Report Status 11/01/2019 FINAL  Final  Surgical pcr screen     Status: Abnormal   Collection Time: 10/28/19 11:30 PM   Specimen: Nasal Mucosa; Nasal Swab  Result Value Ref Range Status   MRSA, PCR POSITIVE (A) NEGATIVE Final    Comment: RESULT CALLED TO, READ BACK BY AND VERIFIED WITH: NITURADA,L RN 10/29/2019 AT 0259 SKEEN,P    Staphylococcus aureus POSITIVE (A) NEGATIVE Final    Comment: (NOTE) The Xpert SA Assay (FDA approved for NASAL specimens in patients 57 years of age and older), is one component of a comprehensive surveillance program. It is not intended to diagnose infection nor to guide or monitor treatment.     Radiology Studies: No results found.  Zedric Deroy T. Golf  If 7PM-7AM, please contact night-coverage [email or web address] Password [facility] 11/01/2019, 11:19 AM

## 2019-11-01 NOTE — Progress Notes (Signed)
[facility] Progress Note   New start.  Assessment/ Plan:   1. AKI/CKD stage IV- in setting of acute on chronic diastolic CHF and diuresis. His baselineis3-3.5 and is followed by Dr. Carolin Sicks at [facility]. BUN/Crt climbed and volume status worsened despite diuresis. Started dialysis and tolerating well.  Status post 2 treatments now planning to continue Monday Wednesday Friday dialysis.  Tachypneic on exam despite inhalers and will plan on hd for UF today. Some edema on exam so would benefit from increase ultrafiltration. Difficulties with insight and palliative care is following.  2. Dialysis Access: TDC on 5/13. 1st stage BBF placed  10/29/2019.  Plan to follow-up with vascular in 6 weeks. Will CTM 3. Acute on chronic diastolic CHF-not responding to high dose diuretics- Uf with dialysis 4. Urinary retention- s/p foley catheter and mayneed to be discharged with this. Given minimal UOP could consider removal prior to DC. 5. Anemia of CKD stage IV- hgbdropped to 6.8 and received a unit of PRBC's at that time.Received 2 doses IV feraheme on 5/1 and Aranesp 40 mcg on 4/30. ESA increased to 1108mcg on 5/10. Will CTM. Hgb stable but not optimal 6. DM type 2 with hyperglycemia - per primary svc 7. Bones-phosphorus is improving with dialysis.  Continue PhosLo at current dose. - PTH 172 8. Hyponatremia-resolved .  Likely hypervolemic in nature.  Ultrafiltration with dialysis   Subjective:   Patient states he is more short of breath today.  Patient seen by vascular  with plans to follow-up in 6 weeks.   Objective:   BP 120/70 (BP Location: Left Arm)   Pulse 73   Temp 98.5 F (36.9 C) (Oral)   Resp 18   Ht 5\' 3"  (1.6 m)   Wt 78 kg   SpO2 96%   BMI 30.47 kg/m   Intake/Output Summary (Last 24 hours) at 11/01/2019 1141 Last data filed at 11/01/2019 0326 Gross per 24 hour  Intake 480 ml  Output 202 ml  Net 278 ml   Weight change:   Physical Exam: Gen: NAD, lying in  bed CVS: no rub, normal rate Resp: CTA BL, no iwob Abd: +BS, soft, NT/ND Ext: 2+ edema in BLE Access: rt BBF good bruit  Imaging: No results found.  Labs: BMET Recent Labs  Lab 10/27/19 0719 10/28/19 0753 10/29/19 0158 10/29/19 0609 10/30/19 0621 10/31/19 0302 11/01/19 0418  NA 130* 132* 132* 133* 133* 135 135  K 3.9 3.9 4.0 3.7 3.9 3.7 3.4*  CL 94* 93* 92* 97* 96* 96* 99  CO2 20* 20* 22 22 22 25 24   GLUCOSE 184* 102* 158* 137* 164* 118* 136*  BUN 105* 111* 120* 86* 91* 71* 53*  CREATININE 6.49* 7.00* 7.25* 5.69* 6.13* 4.78* 4.55*  CALCIUM 8.9 8.7* 8.5* 8.4* 8.2* 8.4* 8.4*  PHOS 9.1* 9.1* 9.8* 7.2* 7.4* 5.7* 5.0*   CBC Recent Labs  Lab 10/26/19 0611 10/26/19 0611 10/27/19 0719 10/28/19 0753 10/29/19 0609 10/30/19 0621  WBC 5.2   < > 10.3 12.0* 13.0* 11.0*  NEUTROABS 3.6  --  8.5* 9.6*  --   --   HGB 8.4*   < > 8.5* 8.3* 8.1* 7.7*  HCT 26.9*   < > 27.1* 25.2* 25.0* 23.6*  MCV 91.5   < > 90.6 88.4 87.7 86.8  PLT 215   < > 213 222 210 201   < > = values in this interval not displayed.    Medications:    . aspirin EC  81 mg  Oral Daily  . bisacodyl  10 mg Rectal Once  . brimonidine  1 drop Left Eye BID  . calcium acetate  1,334 mg Oral TID WC  . Chlorhexidine Gluconate Cloth  6 each Topical Q0600  . darbepoetin (ARANESP) injection - NON-DIALYSIS  100 mcg Subcutaneous Q Mon-1800  . dorzolamide-timolol  1 drop Left Eye BID  . gatifloxacin  1 drop Left Eye QID  . hydrALAZINE  25 mg Oral TID  . insulin aspart  0-9 Units Subcutaneous TID WC  . latanoprost  1 drop Left Eye QHS  . mupirocin ointment  1 application Nasal BID  . senna-docusate  1 tablet Oral BID      Otelia Santee, MD 11/01/2019, 11:41 AM

## 2019-11-01 NOTE — Progress Notes (Signed)
   11/01/19 1441  Clinical Encounter Type  Visited With Patient  Visit Type Initial;Other (Comment) (Advance Directive)  Referral From Palliative care team  Consult/Referral To Chaplain  This chaplain responded to PMT consult for HCPOA.  The chaplain completed Advance Directive education with the Pt.  The Pt. chose only to complete HCPOA naming Jahlen Bollman as his [place]. At the request of the Pt., the chaplain phoned Elchanan Bob at [phone] to update her of the Pt. decision.  The document was signed by the Pt. and notarized.  The original HCPOA and one copy was left in the Pt. room.  A copy will be placed in the Pt. Chart.  The chaplain is available for F/U spiritual care as needed.

## 2019-11-01 NOTE — Plan of Care (Signed)
  Problem: Education: Goal: Ability to demonstrate management of disease process will improve Outcome: Progressing   Problem: Education: Goal: Ability to verbalize understanding of medication therapies will improve Outcome: Progressing   Problem: Activity: Goal: Capacity to carry out activities will improve Outcome: Progressing    Problem: Safety: Goal: Ability to remain free from injury will improve Outcome: Progressing

## 2019-11-01 NOTE — Progress Notes (Signed)
PT Cancellation Note  Patient Details Name: Christopher Davila MRN: [id number] DOB: 29-Apr-1971   Cancelled Treatment:    Reason Eval/Treat Not Completed: Other (comment);Fatigue/lethargy limiting ability to participate.  Pt has been attempted x 2 today, both times expressed fatigue and inability to get OOB or exercise.  Follow up in AM to reattempt.   Ramond Dial 11/01/2019, 3:35 PM  Mee Hives, PT MS Acute Rehab Dept. Number: [place] [facility] [place]

## 2019-11-02 LAB — RENAL FUNCTION PANEL
Albumin: 2.2 g/dL — ABNORMAL LOW (ref 3.5–5.0)
Anion gap: 12 (ref 5–15)
BUN: 57 mg/dL — ABNORMAL HIGH (ref 6–20)
CO2: 24 mmol/L (ref 22–32)
Calcium: 8.6 mg/dL — ABNORMAL LOW (ref 8.9–10.3)
Chloride: 99 mmol/L (ref 98–111)
Creatinine, Ser: 4.71 mg/dL — ABNORMAL HIGH (ref 0.61–1.24)
GFR calc Af Amer: 16 mL/min — ABNORMAL LOW
GFR calc non Af Amer: 14 mL/min — ABNORMAL LOW
Glucose, Bld: 123 mg/dL — ABNORMAL HIGH (ref 70–99)
Phosphorus: 5.4 mg/dL — ABNORMAL HIGH (ref 2.5–4.6)
Potassium: 3.6 mmol/L (ref 3.5–5.1)
Sodium: 135 mmol/L (ref 135–145)

## 2019-11-02 LAB — GLUCOSE, CAPILLARY
Glucose-Capillary: 160 mg/dL — ABNORMAL HIGH (ref 70–99)
Glucose-Capillary: 209 mg/dL — ABNORMAL HIGH (ref 70–99)
Glucose-Capillary: 119 mg/dL — ABNORMAL HIGH (ref 70–99)

## 2019-11-02 MED ORDER — DARBEPOETIN ALFA 100 MCG/0.5ML IJ SOSY
100.0000 ug | PREFILLED_SYRINGE | INTRAMUSCULAR | Status: DC
  Administered 2019-11-02 – 2019-11-16 (×3): 100 ug via SUBCUTANEOUS
  Filled 2019-11-02 (×3): qty 0.5

## 2019-11-02 MED ORDER — TRAZODONE HCL 50 MG PO TABS
50.0000 mg | ORAL_TABLET | Freq: Every evening | ORAL | Status: DC | PRN
  Administered 2019-11-02 – 2019-11-18 (×4): 50 mg via ORAL
  Filled 2019-11-02 (×7): qty 1

## 2019-11-02 MED ORDER — HEPARIN SODIUM (PORCINE) 1000 UNIT/ML IJ SOLN
INTRAMUSCULAR | Status: AC
  Filled 2019-11-02: qty 3

## 2019-11-02 NOTE — Plan of Care (Signed)
  Problem: Education: Goal: Ability to demonstrate management of disease process will improve Outcome: Progressing Goal: Ability to verbalize understanding of medication therapies will improve Outcome: Progressing Goal: Individualized Educational Video(s) Outcome: Progressing   Problem: Activity: Goal: Capacity to carry out activities will improve Outcome: Progressing   Problem: Cardiac: Goal: Ability to achieve and maintain adequate cardiopulmonary perfusion will improve Outcome: Progressing   Problem: Education: Goal: Knowledge of General Education information will improve Description: Including pain rating scale, medication(s)/side effects and non-pharmacologic comfort measures Outcome: Progressing   Problem: Health Behavior/Discharge Planning: Goal: Ability to manage health-related needs will improve Outcome: Progressing   Problem: Clinical Measurements: Goal: Ability to maintain clinical measurements within normal limits will improve Outcome: Progressing Goal: Will remain free from infection Outcome: Progressing Goal: Diagnostic test results will improve Outcome: Progressing Goal: Respiratory complications will improve Outcome: Progressing Goal: Cardiovascular complication will be avoided Outcome: Progressing   Problem: Activity: Goal: Risk for activity intolerance will decrease Outcome: Progressing   Problem: Coping: Goal: Level of anxiety will decrease Outcome: Progressing   Problem: Elimination: Goal: Will not experience complications related to bowel motility Outcome: Progressing Goal: Will not experience complications related to urinary retention Outcome: Progressing   Problem: Pain Managment: Goal: General experience of comfort will improve Outcome: Progressing   Problem: Safety: Goal: Ability to remain free from injury will improve Outcome: Progressing   Problem: Education: Goal: Knowledge of disease and its progression will improve Outcome:  Progressing Goal: Individualized Educational Video(s) Outcome: Progressing   Problem: Fluid Volume: Goal: Compliance with measures to maintain balanced fluid volume will improve Outcome: Progressing   Problem: Health Behavior/Discharge Planning: Goal: Ability to manage health-related needs will improve Outcome: Progressing   Problem: Nutritional: Goal: Ability to make healthy dietary choices will improve Outcome: Progressing   Problem: Clinical Measurements: Goal: Complications related to the disease process, condition or treatment will be avoided or minimized Outcome: Progressing

## 2019-11-02 NOTE — Progress Notes (Signed)
PROGRESS NOTE  Christopher Davila SFK:[id number] DOB: Aug 21, 1970   PCP: Marliss Coots, NP  Patient is from: Home.  Lives with his cousin.  DOA: 10/13/2019 LOS: 87  Brief Narrative / Interim history: 49 year old male with history of hypertension, diabetes mellitus/gastroparesis/blindness, CKD stage IV, chronic diastolic CHF admitted with diarrhea, weakness and failure to thrive. He was  recently admitted 4/10-12 for acute on chronic diastolic CHF.  He returned to the ER on 4/20 but did not qualify for hospitalization at that time and he has been not able to be placed in a SNF. In the ED had severe acidosis with a gap, given bicarb IV fluids and was admitted. Diarrhea has since resolved C. difficile negative GI panel negative.  Patient developed progressive fluid overload with lower leg edema acute on chronic diastolic CHF. Was started on lasix drip and metolazone but remained fluid overloaded with oliguria and uremic symptoms.   Patient had [facility] and started HD on 10/28/2019.  Had right arm aVF on 5/14. HD switched from MWF to TTS due to census.  Still fluid overloaded.   Subjective: Seen and examined earlier this morning while on HD.  No major events overnight of this morning.  No complaints.  He denies chest pain, dyspnea or GI symptoms.  Objective: Vitals:   11/02/19 0830 11/02/19 0900 11/02/19 0904 11/02/19 1011  BP: (!) 169/65 137/89 (!) 170/102 (!) 171/99  Pulse: 76 76 77 76  Resp: 18 (!) 22 (!) 22 20  Temp:   98.1 F (36.7 C) 98 F (36.7 C)  TempSrc:   Oral Oral  SpO2:   96% 99%  Weight:      Height:        Intake/Output Summary (Last 24 hours) at 11/02/2019 1059 Last data filed at 11/02/2019 1033 Gross per 24 hour  Intake 120 ml  Output 3450 ml  Net -3330 ml   Filed Weights   11/01/19 0327 11/02/19 0259 11/02/19 0508  Weight: 78 kg 77.1 kg 80.9 kg    Examination:  GENERAL: No apparent distress.  Nontoxic. HEENT: MMM.  Legally blind.  Hearing grossly intact. NECK:  Supple.  No apparent JVD.  RESP: 96% on 3 L.  No IWOB.  Fair aeration bilaterally. CVS:  RRR. Heart sounds normal.  ABD/GI/GU: BS+. Abd soft, NTND.  Indwelling Foley. MSK/EXT:  Moves extremities.  2+ BLE edema.  Scrotal and penile edema. SKIN: no apparent skin lesion or wound NEURO: Awake, alert and oriented to self, place, year and the president.  No apparent focal neuro deficit. PSYCH: Calm. Normal affect.   Procedures:  None  Microbiology summarized: 4/28-influenza PCR and COVID-19 PCR negative. 4/28-C. difficile negative. 4/28-GI panel negative. 5/12-blood and urine cultures ordered.  Assessment & Plan: Acute respiratory failure with hypoxia due to acute on chronic diastolic CHF/fluid overload-improved. -Wean oxygen to room air -Encourage incentive spirometry -Manage CHF and fluid overload as below  Acute on chronic diastolic CHF/fluid overload in the setting of renal failure-did not respond to diuretics.  Still with significant edema.  -Getting ultrafiltration with dialysis  AKI on CKD-4 progressing to ESRD/oliguria/uremia-improved -[facility] and initial HD on 5/13. HD MWF>>TTS -Right arm aVF on 5/14.  Follow-up with [facility] in 6 weeks  Hypervolemic hyponatremia due to CHF and renal failure: Resolved.  Anion gap metabolic acidosis due to renal failure-resolved.  Bone mineral disorder/hyperphosphatemia: Improved -Per nephrology.  Hypokalemia: Resolved. -Per nephrology  Anemia of renal disease-H&H relatively stable after 1 unit -IV iron and Aranesp per nephrology  Acute  metabolic encephalopathy: Likely due to uremia.  Resolved.  Oriented x3+ -Continue holding gabapentin -Frequent reorientation and delirium precautions  Acute urine retention: Indwelling Foley catheter placed here. -Continue indwelling Foley for now given significant penile edema.  Hypothermia: Resolved. -Work-up not revealing for specific etiology.  Controlled DM-2: A1c 6.4 09/19/2019 Recent Labs     11/01/19 1222 11/01/19 1653 11/01/19 2116  GLUCAP 131* 238* 198*  -Continue SSI and Lantus 5 units daily  Essential hypertension: Normotensive -Hold morning meds  Hyperlipidemia: continue statin.  Diarrhea: Resolved.  Generalized weakness/Debility:  -Therapy recommended SNF.  Insomnia -Will try low dose trazodone tonight  Goal of care: Significant comorbidity, legally blind with social barrier now on dialysis.  Full code. -Palliative care consulted.          DVT prophylaxis: Subcu heparin Code Status: Full code Family Communication: Updated [name], patient's cousin listed as patient's contact on 5/12 Status is: Inpatient  Remains inpatient appropriate because: Fluid overload/CHF exacerbation/ESRD requiring HD. Patient needs SNF and outpt HD spot  Dispo: The patient is from: Home              Anticipated d/c is to: SNF              Anticipated d/c date is: > 3 days              Patient currently is not medically stable to d/c.        Consultants:  Nephrology IR VVS PMT   Sch Meds:  Scheduled Meds: . aspirin EC  81 mg Oral Daily  . bisacodyl  10 mg Rectal Once  . brimonidine  1 drop Left Eye BID  . calcium acetate  1,334 mg Oral TID WC  . Chlorhexidine Gluconate Cloth  6 each Topical Q0600  . darbepoetin (ARANESP) injection - NON-DIALYSIS  100 mcg Subcutaneous Q Tue-1800  . dorzolamide-timolol  1 drop Left Eye BID  . gatifloxacin  1 drop Left Eye QID  . heparin sodium (porcine)      . insulin aspart  0-9 Units Subcutaneous TID WC  . latanoprost  1 drop Left Eye QHS  . mupirocin ointment  1 application Nasal BID  . senna-docusate  1 tablet Oral BID   Continuous Infusions: . sodium chloride 250 mL (10/16/19 0909)   PRN Meds:.sodium chloride, acetaminophen **OR** acetaminophen, lidocaine, ondansetron (ZOFRAN) IV, traZODone  Antimicrobials: Anti-infectives (From admission, onward)   Start     Dose/Rate Route Frequency Ordered Stop   10/29/19 0600   cefUROXime (ZINACEF) 1.5 g in sodium chloride 0.9 % 100 mL IVPB     1.5 g 200 mL/hr over 30 Minutes Intravenous On call to O.R. 10/28/19 0727 10/29/19 1232   10/28/19 0839  ceFAZolin (ANCEF) IVPB 2g/100 mL premix     over 30 Minutes  Continuous PRN 10/28/19 0846 10/28/19 0909   10/28/19 0826  ceFAZolin (ANCEF) 2-4 GM/100ML-% IVPB    Note to Pharmacy: Domenick Bookbinder   : cabinet override      10/28/19 0826 10/28/19 2044   10/28/19 0800  ceFAZolin (ANCEF) IVPB 2g/100 mL premix     2 g 200 mL/hr over 30 Minutes Intravenous To Radiology 10/28/19 0223 10/29/19 0800   10/28/19 0000  ceFAZolin (ANCEF) IVPB 2g/100 mL premix  Status:  Discontinued     2 g 200 mL/hr over 30 Minutes Intravenous To Radiology 10/27/19 1333 10/28/19 0223       I have personally reviewed the following labs and images: CBC: Recent Labs  Lab 10/27/19 0719 10/28/19 0753 10/29/19 0609 10/30/19 0621  WBC 10.3 12.0* 13.0* 11.0*  NEUTROABS 8.5* 9.6*  --   --   HGB 8.5* 8.3* 8.1* 7.7*  HCT 27.1* 25.2* 25.0* 23.6*  MCV 90.6 88.4 87.7 86.8  PLT 213 222 210 201   BMP &GFR Recent Labs  Lab 10/29/19 0609 10/30/19 0621 10/31/19 0302 11/01/19 0418 11/02/19 0210  NA 133* 133* 135 135 135  K 3.7 3.9 3.7 3.4* 3.6  CL 97* 96* 96* 99 99  CO2 22 22 25 24 24   GLUCOSE 137* 164* 118* 136* 123*  BUN 86* 91* 71* 53* 57*  CREATININE 5.69* 6.13* 4.78* 4.55* 4.71*  CALCIUM 8.4* 8.2* 8.4* 8.4* 8.6*  MG  --  2.2  --   --   --   PHOS 7.2* 7.4* 5.7* 5.0* 5.4*   Estimated Creatinine Clearance: 18.2 mL/min (A) (by C-G formula based on SCr of 4.71 mg/dL (H)). Liver & Pancreas: Recent Labs  Lab 10/29/19 0609 10/30/19 0621 10/31/19 0302 11/01/19 0418 11/02/19 0210  ALBUMIN 2.2* 2.4* 2.4* 2.2* 2.2*   No results for input(s): LIPASE, AMYLASE in the last 168 hours. Recent Labs  Lab 10/28/19 0753  AMMONIA 43*   Diabetic: No results for input(s): HGBA1C in the last 72 hours. Recent Labs  Lab 10/31/19 2126 11/01/19  0625 11/01/19 1222 11/01/19 1653 11/01/19 2116  GLUCAP 167* 139* 131* 238* 198*   Cardiac Enzymes: No results for input(s): CKTOTAL, CKMB, CKMBINDEX, TROPONINI in the last 168 hours. No results for input(s): PROBNP in the last 8760 hours. Coagulation Profile: No results for input(s): INR, PROTIME in the last 168 hours. Thyroid Function Tests: No results for input(s): TSH, T4TOTAL, FREET4, T3FREE, THYROIDAB in the last 72 hours. Lipid Profile: No results for input(s): CHOL, HDL, LDLCALC, TRIG, CHOLHDL, LDLDIRECT in the last 72 hours. Anemia Panel: No results for input(s): VITAMINB12, FOLATE, FERRITIN, TIBC, IRON, RETICCTPCT in the last 72 hours. Urine analysis:    Component Value Date/Time   COLORURINE AMBER (A) 10/27/2019 1559   APPEARANCEUR TURBID (A) 10/27/2019 1559   LABSPEC 1.014 10/27/2019 1559   PHURINE 5.0 10/27/2019 1559   GLUCOSEU 50 (A) 10/27/2019 1559   HGBUR LARGE (A) 10/27/2019 1559   BILIRUBINUR NEGATIVE 10/27/2019 1559   KETONESUR NEGATIVE 10/27/2019 1559   PROTEINUR 100 (A) 10/27/2019 1559   UROBILINOGEN 0.2 12/13/2014 2056   NITRITE NEGATIVE 10/27/2019 1559   LEUKOCYTESUR LARGE (A) 10/27/2019 1559   Sepsis Labs: Invalid input(s): PROCALCITONIN, [place]  Microbiology: Recent Results (from the past 240 hour(s))  Culture, Urine     Status: Abnormal   Collection Time: 10/27/19 11:30 AM   Specimen: Urine, Catheterized  Result Value Ref Range Status   Specimen Description URINE, CATHETERIZED  Final   Special Requests   Final    NONE Performed at [facility], [address], [place], [place] [address]    Culture >=100,000 COLONIES/mL ENTEROCOCCUS FAECALIS (A)  Final   Report Status 10/29/2019 FINAL  Final   Organism ID, Bacteria ENTEROCOCCUS FAECALIS (A)  Final      Susceptibility   Enterococcus faecalis - MIC*    AMPICILLIN <=2 SENSITIVE Sensitive     NITROFURANTOIN <=16 SENSITIVE Sensitive     VANCOMYCIN 1 SENSITIVE Sensitive     *  >=100,000 COLONIES/mL ENTEROCOCCUS FAECALIS  Culture, blood (routine x 2)     Status: None   Collection Time: 10/27/19 12:08 PM   Specimen: BLOOD RIGHT HAND  Result Value Ref  Range Status   Specimen Description BLOOD RIGHT HAND  Final   Special Requests   Final    BOTTLES DRAWN AEROBIC AND ANAEROBIC Blood Culture adequate volume   Culture   Final    NO GROWTH 5 DAYS Performed at [facility], [address], [place], [place] [address]    Report Status 11/01/2019 FINAL  Final  Culture, blood (routine x 2)     Status: None   Collection Time: 10/27/19 12:08 PM   Specimen: BLOOD RIGHT HAND  Result Value Ref Range Status   Specimen Description BLOOD RIGHT HAND  Final   Special Requests   Final    BOTTLES DRAWN AEROBIC AND ANAEROBIC Blood Culture adequate volume   Culture   Final    NO GROWTH 5 DAYS Performed at [facility], [place] [address], [place], [place] [address]    Report Status 11/01/2019 FINAL  Final  Surgical pcr screen     Status: Abnormal   Collection Time: 10/28/19 11:30 PM   Specimen: Nasal Mucosa; Nasal Swab  Result Value Ref Range Status   MRSA, PCR POSITIVE (A) NEGATIVE Final    Comment: RESULT CALLED TO, READ BACK BY AND VERIFIED WITH: NITURADA,L RN 10/29/2019 AT 0259 SKEEN,P    Staphylococcus aureus POSITIVE (A) NEGATIVE Final    Comment: (NOTE) The Xpert SA Assay (FDA approved for NASAL specimens in patients 68 years of age and older), is one component of a comprehensive surveillance program. It is not intended to diagnose infection nor to guide or monitor treatment.     Radiology Studies: No results found.  Taye T. Lepanto  If 7PM-7AM, please contact night-coverage [email or web address] Password [facility] 11/02/2019, 10:59 AM

## 2019-11-02 NOTE — Progress Notes (Signed)
[facility] Progress Note   New start.  Assessment/ Plan:   1. AKI/CKD stage IV- in setting of acute on chronic diastolic CHF and diuresis. His baselineis3-3.5 and is followed by Dr. Carolin Sicks at [facility]. BUN/Crt climbed and volume status worsened despite diuresis. Started dialysis and tolerating well.  - HD today and tolerated (5/18); switching to TTS bec of census. - Dialysis Access: [facility] on 5/13. 1st stage BBF placed  10/29/2019. Plan to follow-up with vascular in 6 weeks. Will CTM  2. Acute on chronic diastolic CHF-not responding to high dose diuretics- Uf with dialysis 3. Urinary retention- s/p foley catheter and mayneed to be discharged with this. Given minimal UOP could consider removal prior to DC. 4. Anemia of CKD stage IV- hgbdropped to 6.8 and received a unit of PRBC's at that time.Received 2 doses IV feraheme on 5/1 and Aranesp 40 mcg on 4/30. ESA increased to 147mcg on 5/10. Will CTM. Hgb stable but not optimal 5. DM type 2 with hyperglycemia - per primary svc 6. Bones-phosphorus is improving with dialysis. Continue PhosLo at current dose. - PTH 172 phos 5.4 (5/18) 7. Hyponatremia-resolved . Likely hypervolemic in nature. Ultrafiltration with dialysis   Subjective:   Patient tolerated HD today.  Patient seen by vascular  with plans to follow-up in 6 weeks.   Objective:   BP (!) 170/102 (BP Location: Left Arm)   Pulse 77   Temp 98.1 F (36.7 C) (Oral)   Resp (!) 22   Ht 5\' 3"  (1.6 m)   Wt 80.9 kg   SpO2 96%   BMI 31.59 kg/m   Intake/Output Summary (Last 24 hours) at 11/02/2019 1000 Last data filed at 11/02/2019 0904 Gross per 24 hour  Intake 120 ml  Output 3450 ml  Net -3330 ml   Weight change: -0.907 kg  Physical Exam: Gen: NAD, lying in bed CVS: no rub, normal rate Resp: CTA BL, no iwob Abd: +BS, soft, NT/ND Ext: 2+ edema in BLE Access: rt BBF good bruit  Imaging: No results found.  Labs: BMET Recent Labs  Lab  10/28/19 0753 10/29/19 0158 10/29/19 0609 10/30/19 0621 10/31/19 0302 11/01/19 0418 11/02/19 0210  NA 132* 132* 133* 133* 135 135 135  K 3.9 4.0 3.7 3.9 3.7 3.4* 3.6  CL 93* 92* 97* 96* 96* 99 99  CO2 20* 22 22 22 25 24 24   GLUCOSE 102* 158* 137* 164* 118* 136* 123*  BUN 111* 120* 86* 91* 71* 53* 57*  CREATININE 7.00* 7.25* 5.69* 6.13* 4.78* 4.55* 4.71*  CALCIUM 8.7* 8.5* 8.4* 8.2* 8.4* 8.4* 8.6*  PHOS 9.1* 9.8* 7.2* 7.4* 5.7* 5.0* 5.4*   CBC Recent Labs  Lab 10/27/19 0719 10/28/19 0753 10/29/19 0609 10/30/19 0621  WBC 10.3 12.0* 13.0* 11.0*  NEUTROABS 8.5* 9.6*  --   --   HGB 8.5* 8.3* 8.1* 7.7*  HCT 27.1* 25.2* 25.0* 23.6*  MCV 90.6 88.4 87.7 86.8  PLT 213 222 210 201    Medications:    . aspirin EC  81 mg Oral Daily  . bisacodyl  10 mg Rectal Once  . brimonidine  1 drop Left Eye BID  . calcium acetate  1,334 mg Oral TID WC  . Chlorhexidine Gluconate Cloth  6 each Topical Q0600  . darbepoetin (ARANESP) injection - NON-DIALYSIS  100 mcg Subcutaneous Q Tue-1800  . dorzolamide-timolol  1 drop Left Eye BID  . gatifloxacin  1 drop Left Eye QID  . heparin sodium (porcine)      .  insulin aspart  0-9 Units Subcutaneous TID WC  . latanoprost  1 drop Left Eye QHS  . mupirocin ointment  1 application Nasal BID  . senna-docusate  1 tablet Oral BID      Otelia Santee, MD 11/02/2019, 10:00 AM

## 2019-11-03 LAB — CBC
HCT: 25 % — ABNORMAL LOW (ref 39.0–52.0)
Hemoglobin: 7.8 g/dL — ABNORMAL LOW (ref 13.0–17.0)
MCH: 28.7 pg (ref 26.0–34.0)
MCHC: 31.2 g/dL (ref 30.0–36.0)
MCV: 91.9 fL (ref 80.0–100.0)
Platelets: 229 10*3/uL (ref 150–400)
RBC: 2.72 MIL/uL — ABNORMAL LOW (ref 4.22–5.81)
RDW: 19.9 % — ABNORMAL HIGH (ref 11.5–15.5)
WBC: 9.9 10*3/uL (ref 4.0–10.5)
nRBC: 0 % (ref 0.0–0.2)

## 2019-11-03 LAB — RENAL FUNCTION PANEL
Albumin: 2.4 g/dL — ABNORMAL LOW (ref 3.5–5.0)
Anion gap: 11 (ref 5–15)
BUN: 33 mg/dL — ABNORMAL HIGH (ref 6–20)
CO2: 26 mmol/L (ref 22–32)
Calcium: 8.5 mg/dL — ABNORMAL LOW (ref 8.9–10.3)
Chloride: 99 mmol/L (ref 98–111)
Creatinine, Ser: 3.47 mg/dL — ABNORMAL HIGH (ref 0.61–1.24)
GFR calc Af Amer: 23 mL/min — ABNORMAL LOW (ref >60)
GFR calc non Af Amer: 20 mL/min — ABNORMAL LOW (ref >60)
Glucose, Bld: 115 mg/dL — ABNORMAL HIGH (ref 70–99)
Phosphorus: 4.6 mg/dL (ref 2.5–4.6)
Potassium: 3.9 mmol/L (ref 3.5–5.1)
Sodium: 136 mmol/L (ref 135–145)

## 2019-11-03 LAB — GLUCOSE, CAPILLARY
Glucose-Capillary: 132 mg/dL — ABNORMAL HIGH (ref 70–99)
Glucose-Capillary: 101 mg/dL — ABNORMAL HIGH (ref 70–99)
Glucose-Capillary: 161 mg/dL — ABNORMAL HIGH (ref 70–99)
Glucose-Capillary: 131 mg/dL — ABNORMAL HIGH (ref 70–99)

## 2019-11-03 LAB — AMMONIA: Ammonia: 38 umol/L — ABNORMAL HIGH (ref 9–35)

## 2019-11-03 MED ORDER — HEPARIN SODIUM (PORCINE) 5000 UNIT/ML IJ SOLN
5000.0000 [IU] | Freq: Three times a day (TID) | INTRAMUSCULAR | Status: DC
  Administered 2019-11-03 – 2019-11-05 (×6): 5000 [IU] via SUBCUTANEOUS
  Filled 2019-11-03 (×6): qty 1

## 2019-11-03 MED ORDER — LACTULOSE 10 GM/15ML PO SOLN
10.0000 g | Freq: Once | ORAL | Status: AC
  Administered 2019-11-03: 10 g via ORAL
  Filled 2019-11-03: qty 15

## 2019-11-03 MED ORDER — CARVEDILOL 12.5 MG PO TABS
12.5000 mg | ORAL_TABLET | Freq: Two times a day (BID) | ORAL | Status: DC
  Administered 2019-11-04 – 2019-11-12 (×18): 12.5 mg via ORAL
  Filled 2019-11-03 (×20): qty 1

## 2019-11-03 MED ORDER — HEPARIN SODIUM (PORCINE) 1000 UNIT/ML IJ SOLN
INTRAMUSCULAR | Status: AC
  Administered 2019-11-03: 1000 [IU]
  Filled 2019-11-03: qty 4

## 2019-11-03 NOTE — Plan of Care (Signed)
  Problem: Education: Goal: Ability to demonstrate management of disease process will improve Outcome: Progressing Goal: Ability to verbalize understanding of medication therapies will improve Outcome: Progressing Goal: Individualized Educational Video(s) Outcome: Progressing   Problem: Activity: Goal: Capacity to carry out activities will improve Outcome: Progressing   Problem: Cardiac: Goal: Ability to achieve and maintain adequate cardiopulmonary perfusion will improve Outcome: Progressing   Problem: Education: Goal: Knowledge of General Education information will improve Description: Including pain rating scale, medication(s)/side effects and non-pharmacologic comfort measures Outcome: Progressing   Problem: Health Behavior/Discharge Planning: Goal: Ability to manage health-related needs will improve Outcome: Progressing   Problem: Clinical Measurements: Goal: Ability to maintain clinical measurements within normal limits will improve Outcome: Progressing Goal: Will remain free from infection Outcome: Progressing Goal: Diagnostic test results will improve Outcome: Progressing Goal: Respiratory complications will improve Outcome: Progressing Goal: Cardiovascular complication will be avoided Outcome: Progressing   Problem: Activity: Goal: Risk for activity intolerance will decrease Outcome: Progressing   Problem: Coping: Goal: Level of anxiety will decrease Outcome: Progressing   Problem: Elimination: Goal: Will not experience complications related to bowel motility Outcome: Progressing Goal: Will not experience complications related to urinary retention Outcome: Progressing   Problem: Pain Managment: Goal: General experience of comfort will improve Outcome: Progressing   Problem: Safety: Goal: Ability to remain free from injury will improve Outcome: Progressing   Problem: Education: Goal: Knowledge of disease and its progression will improve Outcome:  Progressing Goal: Individualized Educational Video(s) Outcome: Progressing   Problem: Fluid Volume: Goal: Compliance with measures to maintain balanced fluid volume will improve Outcome: Progressing   Problem: Health Behavior/Discharge Planning: Goal: Ability to manage health-related needs will improve Outcome: Progressing   Problem: Nutritional: Goal: Ability to make healthy dietary choices will improve Outcome: Progressing   Problem: Clinical Measurements: Goal: Complications related to the disease process, condition or treatment will be avoided or minimized Outcome: Progressing

## 2019-11-03 NOTE — Progress Notes (Signed)
Patient off floor getting HMD tx did trigger yellow MEWS score. No further changes noted.

## 2019-11-03 NOTE — Progress Notes (Signed)
Renal Navigator met with patient at bedside on in the afternoon of 11/02/19 to introduce self, offer support, and discuss referral for OP HD treatment. Patient was pleasant and welcoming to visit. He reports that he is doing much better. Navigator agreed and informed him of attempt to visit last week, when he did not respond to arrival to his room. Patient states understanding of need to continue HD treatment at discharge at a clinic and it was explained that this will take place 3 times a week. He reports that HD treatments are going well so far in the hospital. He states that he lives with him cousin, who is a good support for him. He states that his cousin takes him places when she can or that he takes the bus. Navigator's understanding is that he has applied for Medicaid since being in the hospital and he confirmed. Renal Navigator informed patient that once this has been approved, he can utilize [employer] to get to his medical appointments, including dialysis. Navigator asked if he would like assistance applying for [employer] [place] disability transportation in the meantime, and he agreed. After much discussion, Navigator asked patient if he had any questions. He replied, "am I dead?" This appeared to Navigator to be a serious question, so Navigator took him seriously and assured him that he was still alive and being cared for. Navigator asked him if he knew where he was, and he replied, "[facility] Navigator told him he was correct.  Navigator contacted his cousin to explain OP HD referral as well as transportation application. She was very Patent attorney. She also confirmed Medicaid application completion as well as application with the [facility] for Disability. She states she will update them now with patient's dx of ESRD, which Navigator stated is important for them to note. Navigator completed referral for OP HD treatment to request a seat at [facility] for ESRD. This is the  clinic closest to patient's cousin/Sandra Atkins's home. Navigator will follow closely.   Alphonzo Cruise, [place] Renal Navigator [phone]

## 2019-11-03 NOTE — Progress Notes (Signed)
Will retry as time and pt allow.   11/03/19 1100  PT Visit Information  Last PT Received On 11/03/19  Reason Eval/Treat Not Completed Patient at procedure or test/unavailable    Mee Hives, PT MS Acute Rehab Dept. Number: [place] [facility] [employer]

## 2019-11-03 NOTE — Plan of Care (Signed)
  Problem: Education: Goal: Ability to demonstrate management of disease process will improve Outcome: Progressing   Problem: Activity: Goal: Capacity to carry out activities will improve Outcome: Progressing   Problem: Cardiac: Goal: Ability to achieve and maintain adequate cardiopulmonary perfusion will improve Outcome: Progressing   Problem: Health Behavior/Discharge Planning: Goal: Ability to manage health-related needs will improve Outcome: Progressing   Problem: Safety: Goal: Ability to remain free from injury will improve Outcome: Progressing

## 2019-11-03 NOTE — Progress Notes (Signed)
[facility] Progress Note   New start.  Assessment/ Plan:   1. AKI/CKD stage IV- in setting of acute on chronic diastolic CHF and diuresis. His baselineis3-3.5 and is followed by Dr. Carolin Sicks at [facility]. BUN/Crt climbed and volume status worsened despite diuresis. Started dialysis and tolerating well.  - HD today and tolerated (5/18 UF 3L net); switching to TTS bec of census but will HD again today mainly for UF (2-3+ LE edema) + tomorrow.  - Dialysis Access: [facility] on 5/13.1st stage BBF placed5/14/2021. Plan to follow-up with vascular in 6 weeks.   2. Acute on chronic diastolic CHF-not responding to high dose diuretics- Uf with dialysis 3. Urinary retention- s/p foley catheter and mayneed to be discharged with this. Will check uop over the next 48-72 hrs and remove if not significant +  check bladder scan periodically. 4. Anemia of CKD stage IV- hgbdropped to 6.8 and received a unit of PRBC's at that time.Received 2 doses IV feraheme on 5/1 and Aranesp 40 mcg on 4/30. ESA increased to 172mcg on 5/10. Will CTM. Hgb stable but not optimal 5. DM type 2 with hyperglycemia - per primary svc 6. Bones-phosphorus is improving with dialysis. Continue PhosLo at current dose. - PTH 172 phos 5.4 (5/18) 7. Hyponatremia-resolved . Likely hypervolemic in nature. Ultrafiltration with dialysis  Subjective:   Patient tolerated HD yesterday; denies f/c/n/v. Legally blind Patient seen by vascular with plans to follow-up in 6 weeks.   Objective:   BP (!) 144/93 (BP Location: Left Arm)   Pulse 79   Temp 99.1 F (37.3 C) (Oral)   Resp 18   Ht 5\' 3"  (1.6 m)   Wt 77.1 kg   SpO2 96%   BMI 30.11 kg/m   Intake/Output Summary (Last 24 hours) at 11/03/2019 0900 Last data filed at 11/02/2019 2322 Gross per 24 hour  Intake 240 ml  Output 3150 ml  Net -2910 ml   Weight change: 0 kg  Physical Exam: Gen: NAD, lying in bed CVS: no rub, normal rate Resp: CTA BL, no  iwob Abd: +BS, soft, NT/ND Ext: 2+ edema in BLE Access: rt BBFgood bruit  Imaging: No results found.  Labs: BMET Recent Labs  Lab 10/29/19 0158 10/29/19 2119 10/30/19 0621 10/31/19 0302 11/01/19 0418 11/02/19 0210 11/03/19 0325  NA 132* 133* 133* 135 135 135 136  K 4.0 3.7 3.9 3.7 3.4* 3.6 3.9  CL 92* 97* 96* 96* 99 99 99  CO2 22 22 22 25 24 24 26   GLUCOSE 158* 137* 164* 118* 136* 123* 115*  BUN 120* 86* 91* 71* 53* 57* 33*  CREATININE 7.25* 5.69* 6.13* 4.78* 4.55* 4.71* 3.47*  CALCIUM 8.5* 8.4* 8.2* 8.4* 8.4* 8.6* 8.5*  PHOS 9.8* 7.2* 7.4* 5.7* 5.0* 5.4* 4.6   CBC Recent Labs  Lab 10/28/19 0753 10/29/19 0609 10/30/19 0621  WBC 12.0* 13.0* 11.0*  NEUTROABS 9.6*  --   --   HGB 8.3* 8.1* 7.7*  HCT 25.2* 25.0* 23.6*  MCV 88.4 87.7 86.8  PLT 222 210 201    Medications:    . aspirin EC  81 mg Oral Daily  . bisacodyl  10 mg Rectal Once  . brimonidine  1 drop Left Eye BID  . calcium acetate  1,334 mg Oral TID WC  . Chlorhexidine Gluconate Cloth  6 each Topical Q0600  . darbepoetin (ARANESP) injection - NON-DIALYSIS  100 mcg Subcutaneous Q Tue-1800  . dorzolamide-timolol  1 drop Left Eye BID  . gatifloxacin  1 drop Left Eye QID  . insulin aspart  0-9 Units Subcutaneous TID WC  . latanoprost  1 drop Left Eye QHS  . senna-docusate  1 tablet Oral BID      Otelia Santee, MD 11/03/2019, 9:00 AM

## 2019-11-03 NOTE — Progress Notes (Signed)
PROGRESS NOTE    Christopher Davila  KPT:[id number] DOB: [birth year] DOA: 10/13/2019 PCP: Marliss Coots, NP   Brief Narrative: 49 year old with past medical history significant for hypertension, diabetes, gastroparesis, blindness, CKD stage IV, chronic diastolic heart failure admitted with diarrhea, weakness and failure to thrive.  He was recently admitted on 4/10--12 for acute on chronic diastolic heart failure.    He presented on 4/28 and was found to have severe acidosis with a gap, received bicarb IV fluid and was admitted.  He was having diarrhea and has since then resolved.  C diff and GI pathogen was negative.  Patient subsequently developed progressive fluid overload with lower extremity edema and acute on chronic diastolic heart failure.  He was a started on Lasix drip and metolazone but remain fluid overloaded with oliguria and uremic symptoms.  Patient had TDC and is started hemodialysis on 10/28/2019.  Patient had right arm AV graft on 14.  Hemodialysis is switched from M,W,F to TTS due to census.  Patient is still volume overload/     Assessment & Plan:   Principal Problem:   Diarrhea Active Problems:   Type 2 diabetes mellitus with hypoglycemia (HCC)   Essential hypertension   Hyperlipidemia   CKD (chronic kidney disease) stage 4, GFR 15-29 ml/min (HCC)   Weakness   Metabolic acidosis, increased anion gap   Acute urinary retention   ESRD on dialysis (HCC)   Heart failure with preserved ejection fraction (HCC)   Goals of care, counseling/discussion   Palliative care encounter  1-Acute hypoxic respiratory failure due to acute on chronic diastolic heart failure exacerbation: Volume overload: Improved, but is still requiring hemodialysis for volume management Continue with hemodialysis. - 4 Liters  Acute on chronic diastolic heart failure exacerbation, volume overload in the setting of renal failure Patient did not respond to IV diuresis Started hemodialysis  AKI on CKD  stage IV progressing to end-stage renal disease oliguria now new dialysis Right arm aVF on 5/14 need to follow-up with PPS in 6 weeks. TDC and started on hemodialysis on 5/13 HD TTS  Gap metabolic acidosis due to renal failure: Resolved Hyperphosphatemia: Improved per Anemia of renal diseases;  Received one unit PRBC.  On IV iron and aranesp.  Monitor hb  Acute metabolic encephalopathy: Related to uremia Continue to hold gabapentin Still with confusion and hallucination at times. Check ammonia level mildly elevated restart low-dose lactulose  Acute urinary retention: Foley catheter in place.  Remove when penile edema improved  Hypothermia: Resolved Diabetes type 2: Continue with low-dose Lantus and sliding scale insulin  Hypertension; elevated, start low dose carvedilol.  Hyperlipidemia continue with statins Diarrhea resolved  Goals of care; Patient with significant comorbidity, likely plan with social barrier now on dialysis.. Palliative was consulted  Estimated body mass index is 30.11 kg/m as calculated from the following:   Height as of this encounter: 5\' 3"  (1.6 m).   Weight as of this encounter: 77.1 kg.   DVT prophylaxis: Heparin Code Status: Full code Family Communication: No family at bedside.  Disposition Plan:  Status is: Inpatient  Remains inpatient appropriate because:Altered mental status   Dispo: The patient is from: Home              Anticipated d/c is to: SNF              Anticipated d/c date is: 1 day              Patient currently is not medically stable to  d/c.  Requiring hemodialysis for volume management    Consultants:   Nephrology  IR  PMT  Vascular  Procedures:   Hemodialysis  Antimicrobials:  None  Subjective: He is alert, he is asking to be repositioned in the bed.  His breathing but not back to baseline He has been confused, staff report her hallucination  Objective: Vitals:   11/02/19 1603 11/02/19 1957 11/03/19  0432 11/03/19 0442  BP: 127/84 (!) 125/91 (!) 144/93   Pulse: 72 75 79   Resp: 18  18   Temp: 97.8 F (36.6 C) 98.5 F (36.9 C) 99.1 F (37.3 C)   TempSrc: Oral  Oral   SpO2: 97% 100% 96%   Weight:    77.1 kg  Height:        Intake/Output Summary (Last 24 hours) at 11/03/2019 0844 Last data filed at 11/02/2019 2322 Gross per 24 hour  Intake 240 ml  Output 3150 ml  Net -2910 ml   Filed Weights   11/02/19 0259 11/02/19 0508 11/03/19 0442  Weight: 77.1 kg 80.9 kg 77.1 kg    Examination:  General exam: Appears calm and comfortable  Respiratory system: Clear to auscultation. Respiratory effort normal. Cardiovascular system: S1 & S2 heard, RRR.  +2 edema Gastrointestinal system: Abdomen is nondistended, soft and nontender. No organomegaly or masses felt. Normal bowel sounds heard. GU; significant scrotal edema Central nervous system: Alert and oriented. Extremities: Symmetric 5 x 5 power.    Data Reviewed: I have personally reviewed following labs and imaging studies  CBC: Recent Labs  Lab 10/28/19 0753 10/29/19 0609 10/30/19 0621  WBC 12.0* 13.0* 11.0*  NEUTROABS 9.6*  --   --   HGB 8.3* 8.1* 7.7*  HCT 25.2* 25.0* 23.6*  MCV 88.4 87.7 86.8  PLT 222 210 833   Basic Metabolic Panel: Recent Labs  Lab 10/30/19 0621 10/31/19 0302 11/01/19 0418 11/02/19 0210 11/03/19 0325  NA 133* 135 135 135 136  K 3.9 3.7 3.4* 3.6 3.9  CL 96* 96* 99 99 99  CO2 22 25 24 24 26   GLUCOSE 164* 118* 136* 123* 115*  BUN 91* 71* 53* 57* 33*  CREATININE 6.13* 4.78* 4.55* 4.71* 3.47*  CALCIUM 8.2* 8.4* 8.4* 8.6* 8.5*  MG 2.2  --   --   --   --   PHOS 7.4* 5.7* 5.0* 5.4* 4.6   GFR: Estimated Creatinine Clearance: 24.2 mL/min (A) (by C-G formula based on SCr of 3.47 mg/dL (H)). Liver Function Tests: Recent Labs  Lab 10/30/19 0621 10/31/19 0302 11/01/19 0418 11/02/19 0210 11/03/19 0325  ALBUMIN 2.4* 2.4* 2.2* 2.2* 2.4*   No results for input(s): LIPASE, AMYLASE in the last  168 hours. Recent Labs  Lab 10/28/19 0753  AMMONIA 43*   Coagulation Profile: No results for input(s): INR, PROTIME in the last 168 hours. Cardiac Enzymes: No results for input(s): CKTOTAL, CKMB, CKMBINDEX, TROPONINI in the last 168 hours. BNP (last 3 results) No results for input(s): PROBNP in the last 8760 hours. HbA1C: No results for input(s): HGBA1C in the last 72 hours. CBG: Recent Labs  Lab 11/01/19 2116 11/02/19 1101 11/02/19 1600 11/02/19 2118 11/03/19 0607  GLUCAP 198* 160* 209* 119* 132*   Lipid Profile: No results for input(s): CHOL, HDL, LDLCALC, TRIG, CHOLHDL, LDLDIRECT in the last 72 hours. Thyroid Function Tests: No results for input(s): TSH, T4TOTAL, FREET4, T3FREE, THYROIDAB in the last 72 hours. Anemia Panel: No results for input(s): VITAMINB12, FOLATE, FERRITIN, TIBC, IRON, RETICCTPCT in the last  72 hours. Sepsis Labs: No results for input(s): PROCALCITON, LATICACIDVEN in the last 168 hours.  Recent Results (from the past 240 hour(s))  Culture, Urine     Status: Abnormal   Collection Time: 10/27/19 11:30 AM   Specimen: Urine, Catheterized  Result Value Ref Range Status   Specimen Description URINE, CATHETERIZED  Final   Special Requests   Final    NONE Performed at [facility], [address], [place], [place] [address]    Culture >=100,000 COLONIES/mL ENTEROCOCCUS FAECALIS (A)  Final   Report Status 10/29/2019 FINAL  Final   Organism ID, Bacteria ENTEROCOCCUS FAECALIS (A)  Final      Susceptibility   Enterococcus faecalis - MIC*    AMPICILLIN <=2 SENSITIVE Sensitive     NITROFURANTOIN <=16 SENSITIVE Sensitive     VANCOMYCIN 1 SENSITIVE Sensitive     * >=100,000 COLONIES/mL ENTEROCOCCUS FAECALIS  Culture, blood (routine x 2)     Status: None   Collection Time: 10/27/19 12:08 PM   Specimen: BLOOD RIGHT HAND  Result Value Ref Range Status   Specimen Description BLOOD RIGHT HAND  Final   Special Requests   Final    BOTTLES DRAWN AEROBIC  AND ANAEROBIC Blood Culture adequate volume   Culture   Final    NO GROWTH 5 DAYS Performed at [facility], [place] [address] [place], [place] [address]    Report Status 11/01/2019 FINAL  Final  Culture, blood (routine x 2)     Status: None   Collection Time: 10/27/19 12:08 PM   Specimen: BLOOD RIGHT HAND  Result Value Ref Range Status   Specimen Description BLOOD RIGHT HAND  Final   Special Requests   Final    BOTTLES DRAWN AEROBIC AND ANAEROBIC Blood Culture adequate volume   Culture   Final    NO GROWTH 5 DAYS Performed at [facility], [place] [address], [place], [place] [address]    Report Status 11/01/2019 FINAL  Final  Surgical pcr screen     Status: Abnormal   Collection Time: 10/28/19 11:30 PM   Specimen: Nasal Mucosa; Nasal Swab  Result Value Ref Range Status   MRSA, PCR POSITIVE (A) NEGATIVE Final    Comment: RESULT CALLED TO, READ BACK BY AND VERIFIED WITH: NITURADA,L RN 10/29/2019 AT 0259 SKEEN,P    Staphylococcus aureus POSITIVE (A) NEGATIVE Final    Comment: (NOTE) The Xpert SA Assay (FDA approved for NASAL specimens in patients 27 years of age and older), is one component of a comprehensive surveillance program. It is not intended to diagnose infection nor to guide or monitor treatment.          Radiology Studies: No results found.      Scheduled Meds: . aspirin EC  81 mg Oral Daily  . bisacodyl  10 mg Rectal Once  . brimonidine  1 drop Left Eye BID  . calcium acetate  1,334 mg Oral TID WC  . Chlorhexidine Gluconate Cloth  6 each Topical Q0600  . darbepoetin (ARANESP) injection - NON-DIALYSIS  100 mcg Subcutaneous Q Tue-1800  . dorzolamide-timolol  1 drop Left Eye BID  . gatifloxacin  1 drop Left Eye QID  . insulin aspart  0-9 Units Subcutaneous TID WC  . latanoprost  1 drop Left Eye QHS  . senna-docusate  1 tablet Oral BID   Continuous Infusions: . sodium chloride 250 mL (10/16/19 0909)     LOS: 19 days    Time spent:  35  minutes    Elmarie Shiley, MD [facility]   If 7PM-7AM, please contact night-coverage [email or web address]  11/03/2019, 8:44 AM

## 2019-11-04 ENCOUNTER — Inpatient Hospital Stay (HOSPITAL_COMMUNITY): Payer: Medicaid Other

## 2019-11-04 LAB — GLUCOSE, CAPILLARY
Glucose-Capillary: 120 mg/dL — ABNORMAL HIGH (ref 70–99)
Glucose-Capillary: 150 mg/dL — ABNORMAL HIGH (ref 70–99)
Glucose-Capillary: 92 mg/dL (ref 70–99)
Glucose-Capillary: 167 mg/dL — ABNORMAL HIGH (ref 70–99)

## 2019-11-04 LAB — RENAL FUNCTION PANEL
Albumin: 2.3 g/dL — ABNORMAL LOW (ref 3.5–5.0)
Anion gap: 11 (ref 5–15)
BUN: 22 mg/dL — ABNORMAL HIGH (ref 6–20)
CO2: 27 mmol/L (ref 22–32)
Calcium: 8.5 mg/dL — ABNORMAL LOW (ref 8.9–10.3)
Chloride: 99 mmol/L (ref 98–111)
Creatinine, Ser: 2.87 mg/dL — ABNORMAL HIGH (ref 0.61–1.24)
GFR calc Af Amer: 29 mL/min — ABNORMAL LOW (ref >60)
GFR calc non Af Amer: 25 mL/min — ABNORMAL LOW (ref >60)
Glucose, Bld: 135 mg/dL — ABNORMAL HIGH (ref 70–99)
Phosphorus: 3.3 mg/dL (ref 2.5–4.6)
Potassium: 3.8 mmol/L (ref 3.5–5.1)
Sodium: 137 mmol/L (ref 135–145)

## 2019-11-04 LAB — CBC
HCT: 24.8 % — ABNORMAL LOW (ref 39.0–52.0)
Hemoglobin: 7.7 g/dL — ABNORMAL LOW (ref 13.0–17.0)
MCH: 28.7 pg (ref 26.0–34.0)
MCHC: 31 g/dL (ref 30.0–36.0)
MCV: 92.5 fL (ref 80.0–100.0)
Platelets: 208 10*3/uL (ref 150–400)
RBC: 2.68 MIL/uL — ABNORMAL LOW (ref 4.22–5.81)
RDW: 20 % — ABNORMAL HIGH (ref 11.5–15.5)
WBC: 7.9 10*3/uL (ref 4.0–10.5)
nRBC: 0 % (ref 0.0–0.2)

## 2019-11-04 MED ORDER — LACTULOSE 10 GM/15ML PO SOLN
10.0000 g | Freq: Once | ORAL | Status: AC
  Administered 2019-11-04: 10 g via ORAL
  Filled 2019-11-04: qty 15

## 2019-11-04 MED ORDER — HEPARIN SODIUM (PORCINE) 1000 UNIT/ML IJ SOLN
INTRAMUSCULAR | Status: AC
  Filled 2019-11-04: qty 4

## 2019-11-04 MED ORDER — METHOCARBAMOL 500 MG PO TABS
500.0000 mg | ORAL_TABLET | Freq: Three times a day (TID) | ORAL | Status: AC | PRN
  Administered 2019-11-04: 500 mg via ORAL
  Filled 2019-11-04: qty 1

## 2019-11-04 NOTE — Progress Notes (Signed)
[facility] Progress Note   New start.  Assessment/ Plan:   1. AKI/CKD stage IV- in setting of acute on chronic diastolic CHF and diuresis. His baselineis3-3.5 and is followed by Dr. Carolin Sicks at [facility]. BUN/Crt climbed and volume status worsened despite diuresis. Started dialysis and tolerating well.  - HD today and tolerated (5/18 UF 3L net, 5/19 2.48 UF); switching to TTS bec of census. Plan on  HD  On TTS regimen. Leg swelling better but still has scrotal edema. D/w nursing to use a scrotal sling.  -Dialysis Access: [facility] on 5/13.1st stage BBF placed5/14/2021. Plan to follow-up with vascular in 6 weeks.   2. Acute on chronic diastolic CHF- didnot respond to high dose diuretics- Uf with dialysis 3. Urinary retention- s/p foley catheter and may need to be discharged with this. Will check uop over the next 48-72 hrs and remove if not significant +  check bladder scan periodically -> still making 150-300 ml/24hr so will likely need to stay in with urology f/u . 4. Anemia of CKD stage IV- hgbdropped to 6.8 and received a unit of PRBC's at that time.Received 2 doses IV feraheme on 5/1 and Aranesp 40 mcg on 4/30. ESA increased to 123mcg on 5/10. Hgb stable but not optimal 5. DM type 2 with hyperglycemia - per primary svc 6. Bones-phosphorus is improving with dialysis. Continue PhosLo at current dose. - PTH 172phos 5.4 (5/18) 7. Hyponatremia-resolved . Likely hypervolemic in nature. Ultrafiltration with dialysis  Subjective:   Patienttolerated HD yesterday; denies f/c/n/v. Legally blind Patient seen by vascular with plans to follow-up in 6 weeks.    Objective:   BP 136/88 (BP Location: Left Arm)   Pulse 79   Temp 97.8 F (36.6 C) (Oral)   Resp 17   Ht 5\' 3"  (1.6 m)   Wt 76.7 kg   SpO2 98%   BMI 29.94 kg/m   Intake/Output Summary (Last 24 hours) at 11/04/2019 1054 Last data filed at 11/04/2019 0911 Gross per 24 hour  Intake 480 ml  Output 2730  ml  Net -2250 ml   Weight change: 3.629 kg  Physical Exam: Gen: NAD, lying in bed CVS: no rub, normal rate Resp: CTA BL, no iwob Abd: +BS, soft, NT/ND Ext: 1+ edema in BLE GU: scrotal edema Access: rt BBFgood bruit  Imaging: No results found.  Labs: BMET Recent Labs  Lab 10/29/19 0609 10/30/19 0621 10/31/19 0302 11/01/19 0418 11/02/19 0210 11/03/19 0325 11/04/19 0434  NA 133* 133* 135 135 135 136 137  K 3.7 3.9 3.7 3.4* 3.6 3.9 3.8  CL 97* 96* 96* 99 99 99 99  CO2 22 22 25 24 24 26 27   GLUCOSE 137* 164* 118* 136* 123* 115* 135*  BUN 86* 91* 71* 53* 57* 33* 22*  CREATININE 5.69* 6.13* 4.78* 4.55* 4.71* 3.47* 2.87*  CALCIUM 8.4* 8.2* 8.4* 8.4* 8.6* 8.5* 8.5*  PHOS 7.2* 7.4* 5.7* 5.0* 5.4* 4.6 3.3   CBC Recent Labs  Lab 10/29/19 0609 10/30/19 0621 11/03/19 1015  WBC 13.0* 11.0* 9.9  HGB 8.1* 7.7* 7.8*  HCT 25.0* 23.6* 25.0*  MCV 87.7 86.8 91.9  PLT 210 201 229    Medications:    . aspirin EC  81 mg Oral Daily  . bisacodyl  10 mg Rectal Once  . brimonidine  1 drop Left Eye BID  . calcium acetate  1,334 mg Oral TID WC  . carvedilol  12.5 mg Oral BID WC  . Chlorhexidine Gluconate Cloth  6  each Topical [id number]  . darbepoetin (ARANESP) injection - NON-DIALYSIS  100 mcg Subcutaneous Q Tue-1800  . dorzolamide-timolol  1 drop Left Eye BID  . gatifloxacin  1 drop Left Eye QID  . heparin injection (subcutaneous)  5,000 Units Subcutaneous Q8H  . insulin aspart  0-9 Units Subcutaneous TID WC  . lactulose  10 g Oral Once  . latanoprost  1 drop Left Eye QHS  . senna-docusate  1 tablet Oral BID      Otelia Santee, MD 11/04/2019, 10:54 AM

## 2019-11-04 NOTE — Progress Notes (Signed)
OT Cancellation Note  Patient Details Name: Kathleen Likins MRN: [id number] DOB: 06-05-1971   Cancelled Treatment:    Reason Eval/Treat Not Completed: Patient at procedure or test/ unavailable(Pt currently at HD. Will continue to follow as available and appropriate.)   Zenovia Jarred, MSOT, OTR/L [facility] Office Number: [phone] Pager: [phone]  Zenovia Jarred 11/04/2019, 1:52 PM

## 2019-11-04 NOTE — Progress Notes (Signed)
This note also relates to the following rows which could not be included: Resp - Cannot attach notes to unvalidated device data    11/04/19 1900  Assess: MEWS Score  ECG Heart Rate 79  Assess: MEWS Score  MEWS Temp 0  MEWS Systolic 0  MEWS Pulse 0  MEWS RR 2  MEWS LOC 0  MEWS Score 2  MEWS Score Color Yellow  Assess: if the MEWS score is Yellow or Red  Were vital signs taken at a resting state? Yes  Focused Assessment Documented focused assessment  Early Detection of Sepsis Score *See Row Information* Low  MEWS guidelines implemented *See Row Information* No, previously yellow, continue vital signs every 4 hours  Treat  MEWS Interventions Other (Comment) (pt has been freuquently yellow)  Document  Patient Outcome Other (Comment) (MEWS changed to green)  Progress note created (see row info) Yes   Pt MEWS was a yellow at the start of shift. Patient has a history of chronically yellow this admission.  Repeat VS show patient at a green MEWS. Will continue every 4 hour VS.

## 2019-11-04 NOTE — Progress Notes (Signed)
Pt in HD and will reattempt as time and pt allow.   11/04/19 1400  PT Visit Information  Last PT Received On 11/04/19  Reason Eval/Treat Not Completed Patient at procedure or test/unavailable    Mee Hives, PT MS Acute Rehab Dept. Number: [place]

## 2019-11-04 NOTE — Progress Notes (Signed)
Renal Navigator assisted patient in applying for [employer] [place] disability transportation. Navigator observed RN and NT caring for patient with no assistance from patient. Navigator asked patient how he has been doing working with PT/OT. He replied, "not today." Navigator asked if he is able to sit in a chair and he reports that he is able to do this without issue. Navigator notes that he is having issues with scrotal edema and is concerned that he may have issues with HD treatment in a chair. Renal Navigator informed patient that OP HD is done in a chair and not in a bed and suggests that he have treatment in a chair today to ensure that he can tolerate it. He states that he is not willing to sit in a chair for treatment today, but is willing to have treatment in a chair on Saturday. He does not give a reason for this. Navigator told him that this could potentially delay his discharge if he is ready to leave the hospital before his next treatment, but he continued to refuse recommendation to have HD in a recliner today. Renal Navigator updated Dr. Augustin Coupe on concern that patient is requiring much assistance and needs to complete HD treatment in a recliner prior to discharge. He states he too will encourage this.  Navigator has requested update from [facility]missions on referral status.  Navigator will continue to follow closely.  Christopher Davila, [place] Renal Navigator [phone]

## 2019-11-04 NOTE — Progress Notes (Signed)
PROGRESS NOTE    Christopher Davila  BPZ:[id number] DOB: 11-18-1970 DOA: 10/13/2019 PCP: Marliss Coots, NP   Brief Narrative: 49 year old with past medical history significant for hypertension, diabetes, gastroparesis, blindness, CKD stage IV, chronic diastolic heart failure admitted with diarrhea, weakness and failure to thrive.  He was recently admitted on 4/10--12 for acute on chronic diastolic heart failure.    He presented on 4/28 and was found to have severe acidosis with a gap, received bicarb IV fluid and was admitted.  He was having diarrhea and has since then resolved.  C diff and GI pathogen was negative.  Patient subsequently developed progressive fluid overload with lower extremity edema and acute on chronic diastolic heart failure.  He was a started on Lasix drip and metolazone but remain fluid overloaded with oliguria and uremic symptoms.  Patient had TDC and is started hemodialysis on 10/28/2019.  Patient had right arm AV graft on 14.  Hemodialysis is switched from M,W,F to TTS due to census.  Patient is still volume overload/     Assessment & Plan:   Principal Problem:   Diarrhea Active Problems:   Type 2 diabetes mellitus with hypoglycemia (HCC)   Essential hypertension   Hyperlipidemia   CKD (chronic kidney disease) stage 4, GFR 15-29 ml/min (HCC)   Weakness   Metabolic acidosis, increased anion gap   Acute urinary retention   ESRD on dialysis (HCC)   Heart failure with preserved ejection fraction (HCC)   Goals of care, counseling/discussion   Palliative care encounter  1-Acute hypoxic respiratory failure due to acute on chronic diastolic heart failure exacerbation: Volume overload: Improved, but is still requiring hemodialysis for volume management Continue with hemodialysis. - 6 Liters  Acute on chronic diastolic heart failure exacerbation, volume overload in the setting of renal failure Patient did not respond to IV diuresis Started hemodialysis still volume  overload.  Abdomen distended. Check [place] rule out ascites.   AKI on CKD stage IV progressing to end-stage renal disease oliguria now new dialysis Right arm aVF on 5/14 need to follow-up with PPS in 6 weeks. TDC and started on hemodialysis on 5/13 HD TTS  Gap metabolic acidosis due to renal failure: Resolved Hyperphosphatemia: Improved Anemia of renal diseases;  Received one unit PRBC.  On IV iron and aranesp.  Monitor hb, stable.   Acute metabolic encephalopathy: Related to uremia Continue to hold gabapentin Still with confusion and hallucination at times. ammonia mildly elevated. Received a dose of lactulose. Repeat Ammonia in am.   Acute urinary retention: Foley catheter in place.  Remove when penile edema improved  Hypothermia: Resolved Diabetes type 2: Continue with low-dose Lantus and sliding scale insulin  Hypertension; elevated, start low dose carvedilol.  Hyperlipidemia continue with statins Diarrhea resolved  Goals of care; Patient with significant comorbidity, likely plan with social barrier now on dialysis.. Palliative was consulted  Estimated body mass index is 29.94 kg/m as calculated from the following:   Height as of this encounter: 5\' 3"  (1.6 m).   Weight as of this encounter: 76.7 kg.   DVT prophylaxis: Heparin Code Status: Full code Family Communication: No family at bedside.  Disposition Plan:  Status is: Inpatient  Remains inpatient appropriate because:Altered mental status   Dispo: The patient is from: Home              Anticipated d/c is to: SNF              Anticipated d/c date is: 1 day  Patient currently is not medically stable to d/c.  Requiring hemodialysis for volume management    Consultants:   Nephrology  IR  PMT  Vascular  Procedures:   Hemodialysis  Antimicrobials:  None  Subjective: He is alert, oriented to person and place.  He had BM yesterday.  Abdomen distened.   Objective: Vitals:   11/03/19  1346 11/03/19 2127 11/04/19 0005 11/04/19 0452  BP: (!) 177/101 125/77  136/88  Pulse: 77 76  79  Resp: 18 17  17   Temp: 98.8 F (37.1 C) 98.1 F (36.7 C)  97.8 F (36.6 C)  TempSrc: Oral Oral  Oral  SpO2: 100% 90%  98%  Weight:   76.7 kg   Height:        Intake/Output Summary (Last 24 hours) at 11/04/2019 1047 Last data filed at 11/04/2019 0911 Gross per 24 hour  Intake 480 ml  Output 2730 ml  Net -2250 ml   Filed Weights   11/03/19 1005 11/03/19 1312 11/04/19 0005  Weight: 80.7 kg 78 kg 76.7 kg    Examination:  General exam: NAD, legally blind.  Respiratory system: CTA Cardiovascular system: S 1, S 2 RRR. Plus 2 edema Gastrointestinal system: BS present, soft, distended,  GU; significant scrotal edema Central nervous system: Alert and oriented.  Extremities: Symmetric power. 2   Data Reviewed: I have personally reviewed following labs and imaging studies  CBC: Recent Labs  Lab 10/29/19 0609 10/30/19 0621 11/03/19 1015  WBC 13.0* 11.0* 9.9  HGB 8.1* 7.7* 7.8*  HCT 25.0* 23.6* 25.0*  MCV 87.7 86.8 91.9  PLT 210 201 211   Basic Metabolic Panel: Recent Labs  Lab 10/30/19 0621 10/30/19 0621 10/31/19 0302 11/01/19 0418 11/02/19 0210 11/03/19 0325 11/04/19 0434  NA 133*   < > 135 135 135 136 137  K 3.9   < > 3.7 3.4* 3.6 3.9 3.8  CL 96*   < > 96* 99 99 99 99  CO2 22   < > 25 24 24 26 27   GLUCOSE 164*   < > 118* 136* 123* 115* 135*  BUN 91*   < > 71* 53* 57* 33* 22*  CREATININE 6.13*   < > 4.78* 4.55* 4.71* 3.47* 2.87*  CALCIUM 8.2*   < > 8.4* 8.4* 8.6* 8.5* 8.5*  MG 2.2  --   --   --   --   --   --   PHOS 7.4*   < > 5.7* 5.0* 5.4* 4.6 3.3   < > = values in this interval not displayed.   GFR: Estimated Creatinine Clearance: 29.2 mL/min (A) (by C-G formula based on SCr of 2.87 mg/dL (H)). Liver Function Tests: Recent Labs  Lab 10/31/19 0302 11/01/19 0418 11/02/19 0210 11/03/19 0325 11/04/19 0434  ALBUMIN 2.4* 2.2* 2.2* 2.4* 2.3*   No results  for input(s): LIPASE, AMYLASE in the last 168 hours. Recent Labs  Lab 11/03/19 0913  AMMONIA 38*   Coagulation Profile: No results for input(s): INR, PROTIME in the last 168 hours. Cardiac Enzymes: No results for input(s): CKTOTAL, CKMB, CKMBINDEX, TROPONINI in the last 168 hours. BNP (last 3 results) No results for input(s): PROBNP in the last 8760 hours. HbA1C: No results for input(s): HGBA1C in the last 72 hours. CBG: Recent Labs  Lab 11/03/19 0607 11/03/19 1354 11/03/19 1652 11/03/19 2118 11/04/19 0605  GLUCAP 132* 101* 161* 131* 120*   Lipid Profile: No results for input(s): CHOL, HDL, LDLCALC, TRIG, CHOLHDL, LDLDIRECT in the last  72 hours. Thyroid Function Tests: No results for input(s): TSH, T4TOTAL, FREET4, T3FREE, THYROIDAB in the last 72 hours. Anemia Panel: No results for input(s): VITAMINB12, FOLATE, FERRITIN, TIBC, IRON, RETICCTPCT in the last 72 hours. Sepsis Labs: No results for input(s): PROCALCITON, LATICACIDVEN in the last 168 hours.  Recent Results (from the past 240 hour(s))  Culture, Urine     Status: Abnormal   Collection Time: 10/27/19 11:30 AM   Specimen: Urine, Catheterized  Result Value Ref Range Status   Specimen Description URINE, CATHETERIZED  Final   Special Requests   Final    NONE Performed at [facility], [address], [place], [place] [address]    Culture >=100,000 COLONIES/mL ENTEROCOCCUS FAECALIS (A)  Final   Report Status 10/29/2019 FINAL  Final   Organism ID, Bacteria ENTEROCOCCUS FAECALIS (A)  Final      Susceptibility   Enterococcus faecalis - MIC*    AMPICILLIN <=2 SENSITIVE Sensitive     NITROFURANTOIN <=16 SENSITIVE Sensitive     VANCOMYCIN 1 SENSITIVE Sensitive     * >=100,000 COLONIES/mL ENTEROCOCCUS FAECALIS  Culture, blood (routine x 2)     Status: None   Collection Time: 10/27/19 12:08 PM   Specimen: BLOOD RIGHT HAND  Result Value Ref Range Status   Specimen Description BLOOD RIGHT HAND  Final   Special  Requests   Final    BOTTLES DRAWN AEROBIC AND ANAEROBIC Blood Culture adequate volume   Culture   Final    NO GROWTH 5 DAYS Performed at [facility], [place] [address], [place], [place] [address]    Report Status 11/01/2019 FINAL  Final  Culture, blood (routine x 2)     Status: None   Collection Time: 10/27/19 12:08 PM   Specimen: BLOOD RIGHT HAND  Result Value Ref Range Status   Specimen Description BLOOD RIGHT HAND  Final   Special Requests   Final    BOTTLES DRAWN AEROBIC AND ANAEROBIC Blood Culture adequate volume   Culture   Final    NO GROWTH 5 DAYS Performed at [facility], [place] [address] [place], [place] [address]    Report Status 11/01/2019 FINAL  Final  Surgical pcr screen     Status: Abnormal   Collection Time: 10/28/19 11:30 PM   Specimen: Nasal Mucosa; Nasal Swab  Result Value Ref Range Status   MRSA, PCR POSITIVE (A) NEGATIVE Final    Comment: RESULT CALLED TO, READ BACK BY AND VERIFIED WITH: NITURADA,L RN 10/29/2019 AT 0259 SKEEN,P    Staphylococcus aureus POSITIVE (A) NEGATIVE Final    Comment: (NOTE) The Xpert SA Assay (FDA approved for NASAL specimens in patients 36 years of age and older), is one component of a comprehensive surveillance program. It is not intended to diagnose infection nor to guide or monitor treatment.          Radiology Studies: No results found.      Scheduled Meds: . aspirin EC  81 mg Oral Daily  . bisacodyl  10 mg Rectal Once  . brimonidine  1 drop Left Eye BID  . calcium acetate  1,334 mg Oral TID WC  . carvedilol  12.5 mg Oral BID WC  . Chlorhexidine Gluconate Cloth  6 each Topical Q0600  . darbepoetin (ARANESP) injection - NON-DIALYSIS  100 mcg Subcutaneous Q Tue-1800  . dorzolamide-timolol  1 drop Left Eye BID  . gatifloxacin  1 drop Left Eye QID  . heparin injection (subcutaneous)  5,000  Units Subcutaneous Q8H  . insulin aspart  0-9 Units Subcutaneous TID WC  . lactulose  10 g Oral Once  .  latanoprost  1 drop Left Eye QHS  . senna-docusate  1 tablet Oral BID   Continuous Infusions: . sodium chloride 250 mL (10/16/19 0909)     LOS: 20 days    Time spent: 35 minutes    Belkys A Regalado, MD [facility]   If 7PM-7AM, please contact night-coverage [email or web address]  11/04/2019, 10:47 AM

## 2019-11-04 NOTE — Progress Notes (Signed)
[facility] does not have any seats available at this time. Referral changed to [facility], who can accommodate. A seat schedule is being worked on at this time. Renal Navigator still awaiting financial clearance, also. Navigator continues to follow closely. Renal Navigator awaiting disposition plan as progress notes indicate discharge to SNF.   Alphonzo Cruise, [place] Renal Navigator [phone]

## 2019-11-05 LAB — RENAL FUNCTION PANEL
Albumin: 2.4 g/dL — ABNORMAL LOW (ref 3.5–5.0)
Anion gap: 10 (ref 5–15)
BUN: 11 mg/dL (ref 6–20)
CO2: 27 mmol/L (ref 22–32)
Calcium: 8.3 mg/dL — ABNORMAL LOW (ref 8.9–10.3)
Chloride: 99 mmol/L (ref 98–111)
Creatinine, Ser: 2.28 mg/dL — ABNORMAL HIGH (ref 0.61–1.24)
GFR calc Af Amer: 38 mL/min — ABNORMAL LOW (ref >60)
GFR calc non Af Amer: 33 mL/min — ABNORMAL LOW (ref >60)
Glucose, Bld: 118 mg/dL — ABNORMAL HIGH (ref 70–99)
Phosphorus: 2.5 mg/dL (ref 2.5–4.6)
Potassium: 3.8 mmol/L (ref 3.5–5.1)
Sodium: 136 mmol/L (ref 135–145)

## 2019-11-05 LAB — GLUCOSE, CAPILLARY
Glucose-Capillary: 119 mg/dL — ABNORMAL HIGH (ref 70–99)
Glucose-Capillary: 137 mg/dL — ABNORMAL HIGH (ref 70–99)
Glucose-Capillary: 148 mg/dL — ABNORMAL HIGH (ref 70–99)
Glucose-Capillary: 144 mg/dL — ABNORMAL HIGH (ref 70–99)

## 2019-11-05 LAB — AMMONIA: Ammonia: 37 umol/L — ABNORMAL HIGH (ref 9–35)

## 2019-11-05 LAB — HEMOGLOBIN AND HEMATOCRIT, BLOOD
HCT: 24.4 % — ABNORMAL LOW (ref 39.0–52.0)
Hemoglobin: 7.6 g/dL — ABNORMAL LOW (ref 13.0–17.0)

## 2019-11-05 NOTE — Progress Notes (Signed)
[facility] Progress Note   New start.  Assessment/ Plan:   1. AKI/CKD stage IV- in setting of acute on chronic diastolic CHF and diuresis. His baselineis3-3.5 and is followed by Dr. Carolin Sicks at [facility]. BUN/Crt climbed and volume status worsened despite diuresis. Started dialysis and tolerating well. HD  (5/18UF 3L net, 5/19 2.48 UF); plan on  HD  Tomorrow on TTS regimen. Leg swelling better but still has scrotal edema. D/w nursing to use a scrotal sling but I don't see the sling on today.  -Dialysis Access: RIJ TDC on 5/13.1st stage BBF placed5/14/2021. Plan to follow-up with vascular in 6 weeks.   - CLIP processing -> waiting for response from [facility]  2. Acute on chronic diastolic CHF- didnot respond to high dose diuretics- Uf with dialysis 3. Urinary retention- s/p foley catheter and may need to be discharged with this.Will check uop over the next 48-72 hrs and remove if not significant + check bladder scan periodically -> still making 125-300 ml/24hr so will likely need to stay in with urology f/u . 4. Anemia of CKD stage IV- hgbdropped to 6.8 and received a unit of PRBC's at that time.Received 2 doses IV feraheme on 5/1 and Aranesp 40 mcg on 4/30. ESA increased to 116mcg on 5/10. Hgb stable but not optimal 5. DM type 2 with hyperglycemia - per primary svc 6. Bones-phosphorus is improving with dialysis. Continue PhosLo at current dose. - PTH 172phos 5.4 (5/18) 7. Hyponatremia-resolved . Likely hypervolemic in nature. Ultrafiltration with dialysis  Subjective:   Patienttolerated HDyesterday; denies f/c/n/v.Legally blind Patient seen by vascular with plans to follow-up in 6 weeks.   Objective:   BP (!) 172/94   Pulse 79   Temp 98.9 F (37.2 C) (Oral)   Resp 18   Ht 5\' 3"  (1.6 m)   Wt 73.5 kg   SpO2 97%   BMI 28.70 kg/m   Intake/Output Summary (Last 24 hours) at 11/05/2019 1004 Last data filed at 11/05/2019 4403 Gross per 24 hour   Intake 600 ml  Output 3098 ml  Net -2498 ml   Weight change: -7.258 kg  Physical Exam: Gen: NAD, lying in bed CVS: no rub, normal rate Resp: CTA BL, no iwob Abd: +BS, soft, NT/ND Ext: 1+ edema in BLE GU: scrotal edema Access: rt BBFgood bruit  Imaging: US Abdomen Limited  Result Date: 11/04/2019 CLINICAL DATA:  Distended enlarged abdomen assess for paracentesis EXAM: LIMITED ABDOMEN ULTRASOUND FOR ASCITES TECHNIQUE: Limited ultrasound survey for ascites was performed in all four abdominal quadrants. COMPARISON:  10/27/2019 FINDINGS: Small volume of diffuse abdominopelvic ascites, not enough to warrant therapeutic paracentesis. Procedure not performed. Pleural effusions noted bilaterally. IMPRESSION: Small volume abdominopelvic ascites. Electronically Signed   By: Jerilynn Mages.  Shick M.D.   On: 11/04/2019 14:36    Labs: BMET Recent Labs  Lab 10/30/19 4742 10/31/19 0302 11/01/19 0418 11/02/19 0210 11/03/19 0325 11/04/19 0434 11/05/19 0422  NA 133* 135 135 135 136 137 136  K 3.9 3.7 3.4* 3.6 3.9 3.8 3.8  CL 96* 96* 99 99 99 99 99  CO2 22 25 24 24 26 27 27   GLUCOSE 164* 118* 136* 123* 115* 135* 118*  BUN 91* 71* 53* 57* 33* 22* 11  CREATININE 6.13* 4.78* 4.55* 4.71* 3.47* 2.87* 2.28*  CALCIUM 8.2* 8.4* 8.4* 8.6* 8.5* 8.5* 8.3*  PHOS 7.4* 5.7* 5.0* 5.4* 4.6 3.3 2.5   CBC Recent Labs  Lab 10/30/19 0621 11/03/19 1015 11/04/19 1326  WBC 11.0* 9.9 7.9  HGB 7.7* 7.8* 7.7*  HCT 23.6* 25.0* 24.8*  MCV 86.8 91.9 92.5  PLT 201 229 208    Medications:    . aspirin EC  81 mg Oral Daily  . bisacodyl  10 mg Rectal Once  . brimonidine  1 drop Left Eye BID  . calcium acetate  1,334 mg Oral TID WC  . carvedilol  12.5 mg Oral BID WC  . Chlorhexidine Gluconate Cloth  6 each Topical Q0600  . darbepoetin (ARANESP) injection - NON-DIALYSIS  100 mcg Subcutaneous Q Tue-1800  . dorzolamide-timolol  1 drop Left Eye BID  . gatifloxacin  1 drop Left Eye QID  . heparin injection  (subcutaneous)  5,000 Units Subcutaneous Q8H  . insulin aspart  0-9 Units Subcutaneous TID WC  . latanoprost  1 drop Left Eye QHS  . senna-docusate  1 tablet Oral BID      Otelia Santee, MD 11/05/2019, 10:04 AM

## 2019-11-05 NOTE — Progress Notes (Signed)
Pt has large amount of blood and blood clots coming from his penis. MD aware.

## 2019-11-05 NOTE — Progress Notes (Signed)
Patient Foley catheter slowly bleeding.  APP notified. Advised okay to continue with SQ heparin.

## 2019-11-05 NOTE — TOC Progression Note (Addendum)
Transition of Care [facility]) - Progression Note    Patient Details  Name: Christopher Davila MRN: [id number] Date of Birth: 1971/01/31  Transition of [facility] CM/SW [place], [place] Phone Number: 11/05/2019, 3:09 PM  Clinical Narrative:     CSW notes per previously [facility] notes that patient is set up with [facility] for charity for HHPT, will need to add [facility], he will go home with a foley catheter. RNCM Neoma Laming reports patient's cousin [name] also on board with home health as has been discharge plan during patient's hospital stay.   Patient is getting bsc and shower chair for charity with [employer]. He goes to the [facility] and [employer], may need ast with meds if dc over w/end with [employer].  Need orders.  CSW notes that patient has no insurance and unable to have payor source for SNF at this time. PT and OT aware of this as well, report min assist today during PT/OT session.   CSW informed renal navigator of this who is persistent patient can not dc home. However SNF is not a financial option for patient at this time as previously noted. Please contact CSW should alternative dispo ideas be identified.       Expected Discharge Plan and Services                                                 Social Determinants of Health (SDOH) Interventions    Readmission Risk Interventions Readmission Risk Prevention Plan 02/19/2019  Transportation Screening Complete  PCP or Specialist Appt within 5-7 Days Complete  Home Care Screening Complete  Medication Review (RN CM) Complete  Some recent data might be hidden

## 2019-11-05 NOTE — Progress Notes (Signed)
Bladder scan= 107 mL. Patient states he is not experiencing pain or cramps at this time.

## 2019-11-05 NOTE — Progress Notes (Addendum)
PROGRESS NOTE    Christopher Davila  EEF:[id number] DOB: 1971/01/07 DOA: 10/13/2019 PCP: Marliss Coots, NP   Brief Narrative: 49 year old with past medical history significant for hypertension, diabetes, gastroparesis, blindness, CKD stage IV, chronic diastolic heart failure admitted with diarrhea, weakness and failure to thrive.  He was recently admitted on 4/10--12 for acute on chronic diastolic heart failure.    He presented on 4/28 and was found to have severe acidosis with a gap, received bicarb IV fluid and was admitted.  He was having diarrhea and has since then resolved.  C diff and GI pathogen was negative.  Patient subsequently developed progressive fluid overload with lower extremity edema and acute on chronic diastolic heart failure.  He was a started on Lasix drip and metolazone but remain fluid overloaded with oliguria and uremic symptoms.  Patient had TDC and is started hemodialysis on 10/28/2019.  Patient had right arm AV graft on 14.  Hemodialysis is switched from M,W,F to TTS due to census.  Patient is still volume overload/     Assessment & Plan:   Principal Problem:   Diarrhea Active Problems:   Type 2 diabetes mellitus with hypoglycemia (HCC)   Essential hypertension   Hyperlipidemia   CKD (chronic kidney disease) stage 4, GFR 15-29 ml/min (HCC)   Weakness   Metabolic acidosis, increased anion gap   Acute urinary retention   ESRD on dialysis (HCC)   Heart failure with preserved ejection fraction (HCC)   Goals of care, counseling/discussion   Palliative care encounter  1-Acute Hypoxic Respiratory Failure due to Acute on Chronic Diastolic Heart Failure Exacerbation: Volume overload: -Improved, but is still requiring hemodialysis for volume management -Continue with hemodialysis. - 8 Liters -Clip process in progress.   Acute on chronic diastolic heart failure exacerbation, volume overload in the setting of renal failure Patient did not respond to IV  diuresis Started hemodialysis still volume overload.  Abdomen distended. [place] negative for ascites.   AKI on CKD stage IV progressing to end-stage renal disease oliguria now new dialysis Right arm aVF on 5/14 need to follow-up with [facility] in 6 weeks. TDC and started on hemodialysis on 5/13 HD TTS  Gap metabolic acidosis due to renal failure: Resolved Hyperphosphatemia: Improved Anemia of renal diseases;  Received one unit PRBC.  On IV iron and aranesp.  Monitor hb, stable.   Acute metabolic encephalopathy: Related to uremia Continue to hold gabapentin Still with confusion and hallucination at times. ammonia mildly elevated (38). Received a dose of lactulose. Ammonia decreased to 37/   Acute urinary retention: No significant urine out put. Will remove foley catheter.   Hypothermia: Resolved.  Diabetes type 2: Continue with low-dose Lantus and sliding scale insulin  Hypertension; Elevated, start low dose carvedilol.   Hyperlipidemia Continue with statins.  Diarrhea Resolved  Hematuria; bleeding from urethra post foley removal.  Discontinue heparin.  Hold aspirin.  Repeat hb tonight.  Check bladder scan, if develops retention , develops pain or worsening blood clot passing could place foley back.  Discussed DDAVP administration with nephrology. Patient has been on HD for some time, he expect platelet function should be good.   Goals of care; Patient with significant comorbidity, likely plan with social barrier now on dialysis.. Palliative was consulted  Estimated body mass index is 28.7 kg/m as calculated from the following:   Height as of this encounter: 5\' 3"  (1.6 m).   Weight as of this encounter: 73.5 kg.   DVT prophylaxis: Heparin Code Status: Full code  Family Communication: No family at bedside.  Disposition Plan:  Status is: Inpatient  Remains inpatient appropriate because:Altered mental status   Dispo: The patient is from: Home              Anticipated d/c  is to: SNF              Anticipated d/c date is: 1 day              Patient currently is not medically stable to d/c.  Requiring hemodialysis for volume management    Consultants:   Nephrology  IR  PMT  Vascular  Procedures:   Hemodialysis  Antimicrobials:  None  Subjective: He denies pain. Had large Bowel movement yesterday. Sleepy this am.   Objective: Vitals:   11/05/19 0755 11/05/19 0757 11/05/19 0810 11/05/19 1134  BP: (!) 175/100 (!) 175/100 (!) 172/94 (!) 168/101  Pulse: 78 79  78  Resp:  18  20  Temp: 98.9 F (37.2 C)   98.9 F (37.2 C)  TempSrc: Oral   Oral  SpO2: 93% 97%  91%  Weight:      Height:        Intake/Output Summary (Last 24 hours) at 11/05/2019 1302 Last data filed at 11/05/2019 1000 Gross per 24 hour  Intake 600 ml  Output 3148 ml  Net -2548 ml   Filed Weights   11/03/19 1312 11/04/19 0005 11/05/19 0032  Weight: 78 kg 76.7 kg 73.5 kg    Examination:  General exam: NAD, Legally blind Respiratory system: CTA Cardiovascular system: S 1, S 2 RRR Gastrointestinal system: Bs present, soft, nt GU; significant scrotal edema Central nervous system; alert, follows command Extremities: No edema   Data Reviewed: I have personally reviewed following labs and imaging studies  CBC: Recent Labs  Lab 10/30/19 0621 11/03/19 1015 11/04/19 1326  WBC 11.0* 9.9 7.9  HGB 7.7* 7.8* 7.7*  HCT 23.6* 25.0* 24.8*  MCV 86.8 91.9 92.5  PLT 201 229 563   Basic Metabolic Panel: Recent Labs  Lab 10/30/19 0621 10/31/19 0302 11/01/19 0418 11/02/19 0210 11/03/19 0325 11/04/19 0434 11/05/19 0422  NA 133*   < > 135 135 136 137 136  K 3.9   < > 3.4* 3.6 3.9 3.8 3.8  CL 96*   < > 99 99 99 99 99  CO2 22   < > 24 24 26 27 27   GLUCOSE 164*   < > 136* 123* 115* 135* 118*  BUN 91*   < > 53* 57* 33* 22* 11  CREATININE 6.13*   < > 4.55* 4.71* 3.47* 2.87* 2.28*  CALCIUM 8.2*   < > 8.4* 8.6* 8.5* 8.5* 8.3*  MG 2.2  --   --   --   --   --   --   PHOS  7.4*   < > 5.0* 5.4* 4.6 3.3 2.5   < > = values in this interval not displayed.   GFR: Estimated Creatinine Clearance: 36 mL/min (A) (by C-G formula based on SCr of 2.28 mg/dL (H)). Liver Function Tests: Recent Labs  Lab 11/01/19 0418 11/02/19 0210 11/03/19 0325 11/04/19 0434 11/05/19 0422  ALBUMIN 2.2* 2.2* 2.4* 2.3* 2.4*   No results for input(s): LIPASE, AMYLASE in the last 168 hours. Recent Labs  Lab 11/03/19 0913 11/05/19 0422  AMMONIA 38* 37*   Coagulation Profile: No results for input(s): INR, PROTIME in the last 168 hours. Cardiac Enzymes: No results for input(s): CKTOTAL, CKMB, CKMBINDEX, TROPONINI in  the last 168 hours. BNP (last 3 results) No results for input(s): PROBNP in the last 8760 hours. HbA1C: No results for input(s): HGBA1C in the last 72 hours. CBG: Recent Labs  Lab 11/04/19 1117 11/04/19 1802 11/04/19 2213 11/05/19 0627 11/05/19 1133  GLUCAP 150* 92 167* 119* 137*   Lipid Profile: No results for input(s): CHOL, HDL, LDLCALC, TRIG, CHOLHDL, LDLDIRECT in the last 72 hours. Thyroid Function Tests: No results for input(s): TSH, T4TOTAL, FREET4, T3FREE, THYROIDAB in the last 72 hours. Anemia Panel: No results for input(s): VITAMINB12, FOLATE, FERRITIN, TIBC, IRON, RETICCTPCT in the last 72 hours. Sepsis Labs: No results for input(s): PROCALCITON, LATICACIDVEN in the last 168 hours.  Recent Results (from the past 240 hour(s))  Culture, Urine     Status: Abnormal   Collection Time: 10/27/19 11:30 AM   Specimen: Urine, Catheterized  Result Value Ref Range Status   Specimen Description URINE, CATHETERIZED  Final   Special Requests   Final    NONE Performed at [facility], [address], [place], [place] [address]    Culture >=100,000 COLONIES/mL ENTEROCOCCUS FAECALIS (A)  Final   Report Status 10/29/2019 FINAL  Final   Organism ID, Bacteria ENTEROCOCCUS FAECALIS (A)  Final      Susceptibility   Enterococcus faecalis - MIC*     AMPICILLIN <=2 SENSITIVE Sensitive     NITROFURANTOIN <=16 SENSITIVE Sensitive     VANCOMYCIN 1 SENSITIVE Sensitive     * >=100,000 COLONIES/mL ENTEROCOCCUS FAECALIS  Culture, blood (routine x 2)     Status: None   Collection Time: 10/27/19 12:08 PM   Specimen: BLOOD RIGHT HAND  Result Value Ref Range Status   Specimen Description BLOOD RIGHT HAND  Final   Special Requests   Final    BOTTLES DRAWN AEROBIC AND ANAEROBIC Blood Culture adequate volume   Culture   Final    NO GROWTH 5 DAYS Performed at [facility], [place] [address], [place], [place] [address]    Report Status 11/01/2019 FINAL  Final  Culture, blood (routine x 2)     Status: None   Collection Time: 10/27/19 12:08 PM   Specimen: BLOOD RIGHT HAND  Result Value Ref Range Status   Specimen Description BLOOD RIGHT HAND  Final   Special Requests   Final    BOTTLES DRAWN AEROBIC AND ANAEROBIC Blood Culture adequate volume   Culture   Final    NO GROWTH 5 DAYS Performed at [facility], [place] [address], [place], [place] [address]    Report Status 11/01/2019 FINAL  Final  Surgical pcr screen     Status: Abnormal   Collection Time: 10/28/19 11:30 PM   Specimen: Nasal Mucosa; Nasal Swab  Result Value Ref Range Status   MRSA, PCR POSITIVE (A) NEGATIVE Final    Comment: RESULT CALLED TO, READ BACK BY AND VERIFIED WITH: NITURADA,L RN 10/29/2019 AT 0259 SKEEN,P    Staphylococcus aureus POSITIVE (A) NEGATIVE Final    Comment: (NOTE) The Xpert SA Assay (FDA approved for NASAL specimens in patients 39 years of age and older), is one component of a comprehensive surveillance program. It is not intended to diagnose infection nor to guide or monitor treatment.          Radiology Studies: US Abdomen Limited  Result Date: 11/04/2019 CLINICAL DATA:  Distended enlarged abdomen assess for paracentesis EXAM: LIMITED ABDOMEN ULTRASOUND FOR ASCITES TECHNIQUE: Limited ultrasound survey for ascites was performed in all four  abdominal quadrants. COMPARISON:  10/27/2019 FINDINGS: Small volume of diffuse abdominopelvic ascites, not enough to warrant therapeutic paracentesis. Procedure not performed. Pleural effusions noted bilaterally. IMPRESSION: Small volume abdominopelvic ascites. Electronically Signed   By: Jerilynn Mages.  Shick M.D.   On: 11/04/2019 14:36        Scheduled Meds: . aspirin EC  81 mg Oral Daily  . bisacodyl  10 mg Rectal Once  . brimonidine  1 drop Left Eye BID  . calcium acetate  1,334 mg Oral TID WC  . carvedilol  12.5 mg Oral BID WC  . Chlorhexidine Gluconate Cloth  6 each Topical Q0600  . darbepoetin (ARANESP) injection - NON-DIALYSIS  100 mcg Subcutaneous Q Tue-1800  . dorzolamide-timolol  1 drop Left Eye BID  . gatifloxacin  1 drop Left Eye QID  . heparin injection (subcutaneous)  5,000 Units Subcutaneous Q8H  . insulin aspart  0-9 Units Subcutaneous TID WC  . latanoprost  1 drop Left Eye QHS  . senna-docusate  1 tablet Oral BID   Continuous Infusions: . sodium chloride 250 mL (10/16/19 0909)     LOS: 21 days    Time spent: 35 minutes    Nyeli Holtmeyer A Dashia Caldeira, MD [facility]   If 7PM-7AM, please contact night-coverage [email or web address]  11/05/2019, 1:02 PM

## 2019-11-05 NOTE — Progress Notes (Signed)
Occupational Therapy Treatment Patient Details Name: Christopher Davila MRN: [id number] DOB: September 29, 1970 Today's Date: 11/05/2019    History of present illness Pt is an 49 y.o. male with PMH DM, HTN, CKD stage IIIb, peripheral neuropathy, diabetic retinopathy, bilateral vision loss, glaucoma, chronic diastolic heart failure who presented to ED with increased weakness.   OT comments  Pt continuing to progress toward stated goals. Session focused on fabricating reusable scrotal sling and BADL mobility progression. Previous x2 scrotal slings have been thrown away. OT fabricated scrotal sling out of sheet and wash cloth due to incontinence and bleeding at penis (RN aware of bleeding). Pt completed bed mobility at min A and sit <> stand with min A +2. Pt stood for scrotal sling fabrication, then stood additional time to pivot to recliner with min A +2. Pt was then set up with lunch to eat in chair. D/c recs remain appropriate, will continue to follow.    Follow Up Recommendations  SNF    Equipment Recommendations       Recommendations for Other Services      Precautions / Restrictions Precautions Precautions: Fall Precaution Comments: blind; scrotal swelling Required Braces or Orthoses: Other Brace Other Brace: scrotal sling made out of sheet due to penis bleeding Restrictions Weight Bearing Restrictions: No       Mobility Bed Mobility Overal bed mobility: Needs Assistance Bed Mobility: Supine to Sit;Sit to Supine     Supine to sit: Min assist     General bed mobility comments: MinA for BLE management and Trunk elevation  Transfers Overall transfer level: Needs assistance Equipment used: 2 person hand held assist Transfers: Stand Pivot Transfers Sit to Stand: Min assist;+2 safety/equipment Stand pivot transfers: Min assist;+2 physical assistance;+2 safety/equipment       General transfer comment: min A +2 to rise and steady for scrotal sling placement. Pt stood additional time  to pivot to recliner    Balance Overall balance assessment: Needs assistance Sitting-balance support: Feet supported Sitting balance-Leahy Scale: Fair     Standing balance support: Bilateral upper extremity supported Standing balance-Leahy Scale: Poor Standing balance comment: reliant on external support                           ADL either performed or assessed with clinical judgement   ADL Overall ADL's : Needs assistance/impaired Eating/Feeding: Set up;Sitting Eating/Feeding Details (indicate cue type and reason): set up in chair to eat ice cream                     Toilet Transfer: Minimal assistance;+2 for safety/equipment;Stand-pivot Toilet Transfer Details (indicate cue type and reason): pt able to progress with transfers this date by powering up to stand with min A to steady and +2 to take pivotal steps to recliner to simulate toilet transfer           General ADL Comments: scrotal sling fabricated with sheet due to penis bleeding this date and hx of slings being thrown out.     Vision Baseline Vision/History: Legally blind     Perception     Praxis      Cognition Arousal/Alertness: Awake/alert Behavior During Therapy: Flat affect Overall Cognitive Status: Impaired/Different from baseline Area of Impairment: Following commands;Safety/judgement;Problem solving                       Following Commands: Follows one step commands with increased time;Follows multi-step commands inconsistently Safety/Judgement: Decreased  awareness of safety;Decreased awareness of deficits   Problem Solving: Slow processing;Requires verbal cues;Requires tactile cues General Comments: increased processing time and cues needed        Exercises     Shoulder Instructions       General Comments      Pertinent Vitals/ Pain       Pain Assessment: Faces Faces Pain Scale: Hurts a little bit Pain Location: scrotum, penis Pain Descriptors /  Indicators: Grimacing Pain Intervention(s): Monitored during session  Home Living                                          Prior Functioning/Environment              Frequency  Min 2X/week        Progress Toward Goals  OT Goals(current goals can now be found in the care plan section)  Progress towards OT goals: Progressing toward goals  Acute Rehab OT Goals Patient Stated Goal: to go home OT Goal Formulation: With patient Time For Goal Achievement: 11/18/19 Potential to Achieve Goals: Good  Plan Discharge plan needs to be updated    Co-evaluation    PT/OT/SLP Co-Evaluation/Treatment: Yes Reason for Co-Treatment: For patient/therapist safety;To address functional/ADL transfers   OT goals addressed during session: ADL's and self-care;Proper use of Adaptive equipment and DME;Strengthening/ROM(scrotal sling)      AM-PAC OT "6 Clicks" Daily Activity     Outcome Measure   Help from another person eating meals?: A Little Help from another person taking care of personal grooming?: A Little Help from another person toileting, which includes using toliet, bedpan, or urinal?: A Lot Help from another person bathing (including washing, rinsing, drying)?: A Lot Help from another person to put on and taking off regular upper body clothing?: A Lot Help from another person to put on and taking off regular lower body clothing?: A Lot 6 Click Score: 14    End of Session Equipment Utilized During Treatment: Gait belt;Rolling walker  OT Visit Diagnosis: Repeated falls (R29.6);Pain;Other symptoms and signs involving cognitive function Pain - part of body: (penis)   Activity Tolerance Patient tolerated treatment well   Patient Left in chair;with call bell/phone within reach;with chair alarm set   Nurse Communication Mobility status        Time: 3295-1884 OT Time Calculation (min): 24 min  Charges: OT General Charges $OT Visit: 1 Visit OT  Treatments $Self Care/Home Management : 8-22 mins  Zenovia Jarred, MSOT, OTR/L [facility] [place] Office Number: [phone] Pager: [phone]  Zenovia Jarred 11/05/2019, 2:57 PM

## 2019-11-05 NOTE — Progress Notes (Signed)
Renal Navigator continues to await financial clearance for patient to be accepted to the OP HD clinic. Navigator will continue to follow closely.  Alphonzo Cruise, [place] Renal Navigator [phone]

## 2019-11-05 NOTE — Progress Notes (Signed)
RN rounded on pt. Pt states he does not need anything at this time. 

## 2019-11-05 NOTE — Progress Notes (Signed)
Physical Therapy Treatment  Name: Christopher Davila MRN: [id number] DOB: 25-Nov-1970 Today's Date: 11/05/2019    History of Present Illness Pt is an 49 y.o. male with PMH DM, HTN, CKD stage IIIb, peripheral neuropathy, diabetic retinopathy, bilateral vision loss, glaucoma, chronic diastolic heart failure who presented to ED with increased weakness.    PT Comments    Pt was seen with PT and OT to work on comfort with his scrotal sling, using linen due to bleeding post cath removal.  Follow pt for progression of standing as tolerated, to work on stepping on walker vs with PT assist and to increase time OOB in chair with nursing to assist monitoring his fatigue and safety.  Will make pt a more durable sling as he is able to resolve the bleeding from the cath.     Follow Up Recommendations  SNF     Equipment Recommendations  None recommended by PT    Recommendations for Other Services       Precautions / Restrictions Precautions Precautions: Fall Precaution Comments: blind; scrotal swelling Required Braces or Orthoses: Other Brace Other Brace: scrotal sling made out of sheet due to penis bleeding Restrictions Weight Bearing Restrictions: No    Mobility  Bed Mobility Overal bed mobility: Needs Assistance Bed Mobility: Supine to Sit     Supine to sit: Min assist        Transfers Overall transfer level: Needs assistance Equipment used: 2 person hand held assist Transfers: Stand Pivot Transfers;Sit to/from Stand Sit to Stand: Min assist;+2 physical assistance;+2 safety/equipment Stand pivot transfers: Min assist;+2 physical assistance;+2 safety/equipment       General transfer comment: min assist to get sling on and to get to chair  Ambulation/Gait             General Gait Details: unable to take mroe than steps to transfer   Stairs             Wheelchair Mobility    Modified Rankin (Stroke Patients Only)       Balance Overall balance assessment: Needs  assistance Sitting-balance support: Feet supported Sitting balance-Leahy Scale: Fair     Standing balance support: Bilateral upper extremity supported Standing balance-Leahy Scale: Poor                              Cognition Arousal/Alertness: Awake/alert Behavior During Therapy: Flat affect Overall Cognitive Status: Impaired/Different from baseline Area of Impairment: Attention;Following commands;Awareness;Problem solving                   Current Attention Level: Selective Memory: Decreased short-term memory Following Commands: Follows one step commands inconsistently;Follows one step commands with increased time Safety/Judgement: Decreased awareness of safety Awareness: Intellectual Problem Solving: Slow processing;Requires verbal cues;Requires tactile cues General Comments: repetitive instructions esp to get to side of bed      Exercises      General Comments General comments (skin integrity, edema, etc.): pt was assisted to the chair, able to sit up for his lunch with legs elevated      Pertinent Vitals/Pain Pain Assessment: Faces Faces Pain Scale: Hurts a little bit Pain Location: scrotum, penis Pain Descriptors / Indicators: Grimacing;Guarding Pain Intervention(s): Monitored during session;Repositioned    Home Living                      Prior Function            PT Goals (  current goals can now be found in the care plan section) Acute Rehab PT Goals Patient Stated Goal: get better Progress towards PT goals: Progressing toward goals    Frequency    Min 3X/week      PT Plan Current plan remains appropriate    Co-evaluation   Reason for Co-Treatment: For patient/therapist safety;To address functional/ADL transfers PT goals addressed during session: Mobility/safety with mobility;Balance        AM-PAC PT "6 Clicks" Mobility   Outcome Measure  Help needed turning from your back to your side while in a flat bed without  using bedrails?: A Little Help needed moving from lying on your back to sitting on the side of a flat bed without using bedrails?: A Little Help needed moving to and from a bed to a chair (including a wheelchair)?: A Little Help needed standing up from a chair using your arms (e.g., wheelchair or bedside chair)?: A Little Help needed to walk in hospital room?: A Lot Help needed climbing 3-5 steps with a railing? : Total 6 Click Score: 15    End of Session Equipment Utilized During Treatment: Gait belt;Oxygen Activity Tolerance: Patient tolerated treatment well;Treatment limited secondary to medical complications (Comment) Patient left: in chair;with call bell/phone within reach;with chair alarm set Nurse Communication: Mobility status PT Visit Diagnosis: Unsteadiness on feet (R26.81);Muscle weakness (generalized) (M62.81);Difficulty in walking, not elsewhere classified (R26.2);Pain Pain - Right/Left: (scrotum)     Time: 4970-2637 PT Time Calculation (min) (ACUTE ONLY): 29 min  Charges:  $Therapeutic Activity: 8-22 mins                  Ramond Dial 11/05/2019, 10:10 PM  Mee Hives, PT MS Acute Rehab Dept. Number: [place] [facility] [place]

## 2019-11-06 DIAGNOSIS — L8990 Pressure ulcer of unspecified site, unspecified stage: Secondary | ICD-10-CM | POA: Insufficient documentation

## 2019-11-06 LAB — RENAL FUNCTION PANEL
Albumin: 2.4 g/dL — ABNORMAL LOW (ref 3.5–5.0)
Anion gap: 16 — ABNORMAL HIGH (ref 5–15)
BUN: 21 mg/dL — ABNORMAL HIGH (ref 6–20)
CO2: 23 mmol/L (ref 22–32)
Calcium: 8.4 mg/dL — ABNORMAL LOW (ref 8.9–10.3)
Chloride: 97 mmol/L — ABNORMAL LOW (ref 98–111)
Creatinine, Ser: 3.24 mg/dL — ABNORMAL HIGH (ref 0.61–1.24)
GFR calc Af Amer: 25 mL/min — ABNORMAL LOW
GFR calc non Af Amer: 22 mL/min — ABNORMAL LOW
Glucose, Bld: 120 mg/dL — ABNORMAL HIGH (ref 70–99)
Phosphorus: 3.8 mg/dL (ref 2.5–4.6)
Potassium: 3.9 mmol/L (ref 3.5–5.1)
Sodium: 136 mmol/L (ref 135–145)

## 2019-11-06 LAB — CBC
HCT: 22 % — ABNORMAL LOW (ref 39.0–52.0)
Hemoglobin: 6.8 g/dL — CL (ref 13.0–17.0)
MCH: 29.2 pg (ref 26.0–34.0)
MCHC: 30.9 g/dL (ref 30.0–36.0)
MCV: 94.4 fL (ref 80.0–100.0)
Platelets: 209 K/uL (ref 150–400)
RBC: 2.33 MIL/uL — ABNORMAL LOW (ref 4.22–5.81)
RDW: 19.9 % — ABNORMAL HIGH (ref 11.5–15.5)
WBC: 9.4 K/uL (ref 4.0–10.5)
nRBC: 0 % (ref 0.0–0.2)

## 2019-11-06 LAB — GLUCOSE, CAPILLARY
Glucose-Capillary: 108 mg/dL — ABNORMAL HIGH (ref 70–99)
Glucose-Capillary: 119 mg/dL — ABNORMAL HIGH (ref 70–99)
Glucose-Capillary: 180 mg/dL — ABNORMAL HIGH (ref 70–99)

## 2019-11-06 LAB — PREPARE RBC (CROSSMATCH)

## 2019-11-06 MED ORDER — SODIUM CHLORIDE 0.9% IV SOLUTION: Freq: Once | INTRAVENOUS | Status: AC

## 2019-11-06 MED ORDER — SODIUM CHLORIDE 0.9 % IV SOLN
0.3000 ug/kg | Freq: Once | INTRAVENOUS | Status: AC
  Administered 2019-11-06: 21.2 ug via INTRAVENOUS
  Filled 2019-11-06: qty 5.3

## 2019-11-06 MED ORDER — HEPARIN SODIUM (PORCINE) 1000 UNIT/ML IJ SOLN
INTRAMUSCULAR | Status: AC
  Filled 2019-11-06: qty 4

## 2019-11-06 NOTE — Plan of Care (Signed)
  Problem: Education: Goal: Ability to demonstrate management of disease process will improve Outcome: Progressing Goal: Ability to verbalize understanding of medication therapies will improve Outcome: Progressing Goal: Individualized Educational Video(s) Outcome: Progressing   Problem: Activity: Goal: Capacity to carry out activities will improve Outcome: Progressing   Problem: Cardiac: Goal: Ability to achieve and maintain adequate cardiopulmonary perfusion will improve Outcome: Progressing   Problem: Education: Goal: Knowledge of General Education information will improve Description: Including pain rating scale, medication(s)/side effects and non-pharmacologic comfort measures Outcome: Progressing   Problem: Health Behavior/Discharge Planning: Goal: Ability to manage health-related needs will improve Outcome: Progressing

## 2019-11-06 NOTE — Progress Notes (Signed)
Dr Jeffie Pollock came and saw the patient and he said not to put a catheter in at this time.  He wrapped the penis with gauze to give a amall amount of compression.  The blood clots have slowed down from this morning.

## 2019-11-06 NOTE — Progress Notes (Signed)
PROGRESS NOTE    Christopher Davila  QMG:[id number] DOB: [birth year] DOA: 10/13/2019 PCP: Marliss Coots, NP   Brief Narrative: 49 year old with past medical history significant for hypertension, diabetes, gastroparesis, blindness, CKD stage IV, chronic diastolic heart failure admitted with diarrhea, weakness and failure to thrive.  He was recently admitted on 4/10--12 for acute on chronic diastolic heart failure.    He presented on 4/28 and was found to have severe acidosis with a gap, received bicarb IV fluid and was admitted.  He was having diarrhea and has since then resolved.  C diff and GI pathogen was negative.  Patient subsequently developed progressive fluid overload with lower extremity edema and acute on chronic diastolic heart failure.  He was a started on Lasix drip and metolazone but remain fluid overloaded with oliguria and uremic symptoms.  Patient had TDC and is started hemodialysis on 10/28/2019.  Patient had right arm AV graft on 14.  Hemodialysis is switched from M,W,F to TTS due to census.  Patient is still volume overload/     Assessment & Plan:   Principal Problem:   Diarrhea Active Problems:   Type 2 diabetes mellitus with hypoglycemia (HCC)   Essential hypertension   Hyperlipidemia   CKD (chronic kidney disease) stage 4, GFR 15-29 ml/min (HCC)   Weakness   Metabolic acidosis, increased anion gap   Acute urinary retention   ESRD on dialysis (HCC)   Heart failure with preserved ejection fraction (HCC)   Goals of care, counseling/discussion   Palliative care encounter   Pressure injury of skin  1-Acute Hypoxic Respiratory Failure due to Acute on Chronic Diastolic Heart Failure Exacerbation: Volume overload: -Improved, but is still requiring hemodialysis for volume management -Continue with hemodialysis. - 8 Liters -Clip process in progress.   Acute on chronic diastolic heart failure exacerbation, volume overload in the setting of renal failure Patient did not  respond to IV diuresis Started hemodialysis still volume overload.  Abdomen distended. [place] negative for ascites.   AKI on CKD stage IV progressing to end-stage renal disease oliguria now new dialysis Right arm aVF on 5/14 need to follow-up with [facility] in 6 weeks. TDC and started on hemodialysis on 5/13 HD TTS  Gap metabolic acidosis due to renal failure: Resolved Hyperphosphatemia: Improved Anemia of renal diseases;  Received one unit PRBC.  On IV iron and aranesp.  Monitor hb, stable.   Acute metabolic encephalopathy: Related to uremia Continue to hold gabapentin Still with confusion and hallucination at times. ammonia mildly elevated (38). Received a dose of lactulose. Ammonia decreased to 37/   Acute blood loss anemia; and anemia of chronic renal diseases. Urethral bleeding Hb down to 6. Transfuse 2 units PRBC 5/22  Acute urinary retention: No significant urine out put. After removing catheter patient develops bleeding through urethra.   Hypothermia: Resolved.  Diabetes type 2: Continue with low-dose Lantus and sliding scale insulin  Hypertension; Elevated, start low dose carvedilol.   Hyperlipidemia Continue with statins.  Diarrhea Resolved  Bleeding from urethra post foley removal.  Discontinue heparin.  Hold aspirin.  Nephrology will order  DDAVP . Urology consulted. They recommend foley catheter placement. Ordered placed.  Transfuse 2 units, hb down to 6  Goals of care; Patient with significant comorbidity, likely plan with social barrier now on dialysis.. Palliative was consulted  Estimated body mass index is 27.63 kg/m as calculated from the following:   Height as of this encounter: 5\' 3"  (1.6 m).   Weight as of this encounter: 70.8  kg.   DVT prophylaxis: Heparin Code Status: Full code Family Communication: No family at bedside.  Disposition Plan:  Status is: Inpatient  Remains inpatient appropriate because:Altered mental status   Dispo: The patient  is from: Home              Anticipated d/c is to: SNF              Anticipated d/c date is: 1 day              Patient currently is not medically stable to d/c.  Requiring hemodialysis for volume management    Consultants:   Nephrology  IR  PMT  Vascular  Procedures:   Hemodialysis  Antimicrobials:  None  Subjective: Still passing clots from urethra.  Denies pain   Objective: Vitals:   11/06/19 1000 11/06/19 1030 11/06/19 1100 11/06/19 1130  BP: 130/84 126/79 (!) 144/84 123/64  Pulse: 75 74 74 72  Resp: (!) 29 (!) 28 (!) 27 (!) 26  Temp:    97.9 F (36.6 C)  TempSrc:    Oral  SpO2:      Weight:      Height:        Intake/Output Summary (Last 24 hours) at 11/06/2019 1144 Last data filed at 11/05/2019 2200 Gross per 24 hour  Intake 480 ml  Output --  Net 480 ml   Filed Weights   11/04/19 0005 11/05/19 0032 11/06/19 0103  Weight: 76.7 kg 73.5 kg 70.8 kg    Examination:  General exam: NAD. Legally blind Respiratory system: CTA Cardiovascular system: S 1, S 2 RRR Gastrointestinal system: BS present, soft, nt GU; significant scrotal edema, bleeding from urethra Central nervous system; alert Extremities plus 2 edema   Data Reviewed: I have personally reviewed following labs and imaging studies  CBC: Recent Labs  Lab 11/03/19 1015 11/04/19 1326 11/05/19 1916 11/06/19 0712  WBC 9.9 7.9  --  9.4  HGB 7.8* 7.7* 7.6* 6.8*  HCT 25.0* 24.8* 24.4* 22.0*  MCV 91.9 92.5  --  94.4  PLT 229 208  --  007   Basic Metabolic Panel: Recent Labs  Lab 11/02/19 0210 11/03/19 0325 11/04/19 0434 11/05/19 0422 11/06/19 0618  NA 135 136 137 136 136  K 3.6 3.9 3.8 3.8 3.9  CL 99 99 99 99 97*  CO2 24 26 27 27 23   GLUCOSE 123* 115* 135* 118* 120*  BUN 57* 33* 22* 11 21*  CREATININE 4.71* 3.47* 2.87* 2.28* 3.24*  CALCIUM 8.6* 8.5* 8.5* 8.3* 8.4*  PHOS 5.4* 4.6 3.3 2.5 3.8   GFR: Estimated Creatinine Clearance: 24.9 mL/min (A) (by C-G formula based on SCr  of 3.24 mg/dL (H)). Liver Function Tests: Recent Labs  Lab 11/02/19 0210 11/03/19 0325 11/04/19 0434 11/05/19 0422 11/06/19 0618  ALBUMIN 2.2* 2.4* 2.3* 2.4* 2.4*   No results for input(s): LIPASE, AMYLASE in the last 168 hours. Recent Labs  Lab 11/03/19 0913 11/05/19 0422  AMMONIA 38* 37*   Coagulation Profile: No results for input(s): INR, PROTIME in the last 168 hours. Cardiac Enzymes: No results for input(s): CKTOTAL, CKMB, CKMBINDEX, TROPONINI in the last 168 hours. BNP (last 3 results) No results for input(s): PROBNP in the last 8760 hours. HbA1C: No results for input(s): HGBA1C in the last 72 hours. CBG: Recent Labs  Lab 11/05/19 0627 11/05/19 1133 11/05/19 1612 11/05/19 2119 11/06/19 0537  GLUCAP 119* 137* 148* 144* 108*   Lipid Profile: No results for input(s): CHOL, HDL, LDLCALC,  TRIG, CHOLHDL, LDLDIRECT in the last 72 hours. Thyroid Function Tests: No results for input(s): TSH, T4TOTAL, FREET4, T3FREE, THYROIDAB in the last 72 hours. Anemia Panel: No results for input(s): VITAMINB12, FOLATE, FERRITIN, TIBC, IRON, RETICCTPCT in the last 72 hours. Sepsis Labs: No results for input(s): PROCALCITON, LATICACIDVEN in the last 168 hours.  Recent Results (from the past 240 hour(s))  Culture, blood (routine x 2)     Status: None   Collection Time: 10/27/19 12:08 PM   Specimen: BLOOD RIGHT HAND  Result Value Ref Range Status   Specimen Description BLOOD RIGHT HAND  Final   Special Requests   Final    BOTTLES DRAWN AEROBIC AND ANAEROBIC Blood Culture adequate volume   Culture   Final    NO GROWTH 5 DAYS Performed at [facility], [address] [place], [place] [address]    Report Status 11/01/2019 FINAL  Final  Culture, blood (routine x 2)     Status: None   Collection Time: 10/27/19 12:08 PM   Specimen: BLOOD RIGHT HAND  Result Value Ref Range Status   Specimen Description BLOOD RIGHT HAND  Final   Special Requests   Final    BOTTLES DRAWN  AEROBIC AND ANAEROBIC Blood Culture adequate volume   Culture   Final    NO GROWTH 5 DAYS Performed at [facility], [place] [address], [place], [place] [address]    Report Status 11/01/2019 FINAL  Final  Surgical pcr screen     Status: Abnormal   Collection Time: 10/28/19 11:30 PM   Specimen: Nasal Mucosa; Nasal Swab  Result Value Ref Range Status   MRSA, PCR POSITIVE (A) NEGATIVE Final    Comment: RESULT CALLED TO, READ BACK BY AND VERIFIED WITH: NITURADA,L RN 10/29/2019 AT 0259 SKEEN,P    Staphylococcus aureus POSITIVE (A) NEGATIVE Final    Comment: (NOTE) The Xpert SA Assay (FDA approved for NASAL specimens in patients 68 years of age and older), is one component of a comprehensive surveillance program. It is not intended to diagnose infection nor to guide or monitor treatment.          Radiology Studies: US Abdomen Limited  Result Date: 11/04/2019 CLINICAL DATA:  Distended enlarged abdomen assess for paracentesis EXAM: LIMITED ABDOMEN ULTRASOUND FOR ASCITES TECHNIQUE: Limited ultrasound survey for ascites was performed in all four abdominal quadrants. COMPARISON:  10/27/2019 FINDINGS: Small volume of diffuse abdominopelvic ascites, not enough to warrant therapeutic paracentesis. Procedure not performed. Pleural effusions noted bilaterally. IMPRESSION: Small volume abdominopelvic ascites. Electronically Signed   By: Jerilynn Mages.  Shick M.D.   On: 11/04/2019 14:36        Scheduled Meds: . sodium chloride   Intravenous Once  . bisacodyl  10 mg Rectal Once  . brimonidine  1 drop Left Eye BID  . calcium acetate  1,334 mg Oral TID WC  . carvedilol  12.5 mg Oral BID WC  . Chlorhexidine Gluconate Cloth  6 each Topical Q0600  . darbepoetin (ARANESP) injection - NON-DIALYSIS  100 mcg Subcutaneous Q Tue-1800  . desmopressin  0.3 mcg/kg Intravenous Once  . dorzolamide-timolol  1 drop Left Eye BID  . gatifloxacin  1 drop Left Eye QID  . insulin aspart  0-9 Units Subcutaneous TID WC    . latanoprost  1 drop Left Eye QHS  . senna-docusate  1 tablet Oral BID   Continuous Infusions: . sodium chloride 250 mL (10/16/19 0909)     LOS: 22 days  Time spent: 35 minutes    Trino Higinbotham A Tyrell Antonio, MD [facility]   If 7PM-7AM, please contact night-coverage [email or web address]  11/06/2019, 11:44 AM

## 2019-11-06 NOTE — Progress Notes (Signed)
Pt continuing to have large blood clots come out of his penis. No pain, pt stable. MD aware. Awaiting morning labs for Hgb. Last Hbg 7.6.  Current vitals:  BP (!) 158/88 (BP Location: Left Arm)   Pulse 73   Temp 98.2 F (36.8 C) (Oral)   Resp 19   Ht 5\' 3"  (1.6 m)   Wt 73.5 kg   SpO2 97%   BMI 28.70 kg/m

## 2019-11-06 NOTE — Progress Notes (Signed)
[facility] Progress Note   New start.  Assessment/ Plan:   1. AKI/CKD stage IV- in setting of acute on chronic diastolic CHF and diuresis. His baselineis3-3.5 and is followed by Dr. Carolin Sicks at [facility]. BUN/Crt climbed and volume status worsened despite diuresis. Started dialysis and tolerating well. HD  (5/18UF 3L net, 5/19 2.48 UF); plan onHD  Tomorrow on TTS regimen. Leg swelling better but still has scrotal edema. D/w nursing to use a scrotal sling but I don't see the sling on today.  -Dialysis Access: RIJ TDC on 5/13.1st stage BBF placed5/14/2021. Plan to follow-up with vascular in 6 weeks.   Seen on HD today (5/22) 2K 126/79 2L net UF goal RIJ TC  - CLIP processing -> waiting for response from [facility]  2. Acute on chronic diastolic CHF-didnot respond to high dose diuretics- Uf with dialysis 3. Urinary retention- s/p foley catheter and may need to be discharged with this.After  Removing foley pt had hematuria which req transfusion. Urology rx 20Fr foley.  To be given  dose of DDAVP 5/22   4. Anemia of CKD stage IV- hgbdropped to 6.8 and received a unit of PRBC's at that time.Received 2 doses IV feraheme on 5/1 and Aranesp 40 mcg on 4/30. ESA increased to 143mcg on 5/10. Hgb stable but not optimal 5. DM type 2 with hyperglycemia - per primary svc 6. Bones-phosphorus is improving with dialysis. Continue PhosLo at current dose. - PTH 172phos 5.4 (5/18) 7. Hyponatremia-resolved . Likely hypervolemic in nature. Ultrafiltration with dialysis   Subjective:   Patienttolerated HDyesterday; denies f/c/n/v.Legally blind Patient seen by vascular with plans to follow-up in 6 weeks. Gross Hematuria since 5/21   Objective:   BP (!) 167/93 (BP Location: Left Arm)   Pulse 75   Temp 98 F (36.7 C) (Oral)   Resp 16   Ht 5\' 3"  (1.6 m)   Wt 70.8 kg   SpO2 97%   BMI 27.63 kg/m   Intake/Output Summary (Last 24 hours) at 11/06/2019 1056 Last data  filed at 11/05/2019 2200 Gross per 24 hour  Intake 480 ml  Output --  Net 480 ml   Weight change: -2.722 kg  Physical Exam: Gen: NAD, lying in bed CVS: no rub, normal rate Resp: CTA BL, no iwob Abd: +BS, soft, NT/ND Ext:1+ edema in BLE GU: scrotal edema Access: rt BBFgood bruit, RIJ TC  Imaging: US Abdomen Limited  Result Date: 11/04/2019 CLINICAL DATA:  Distended enlarged abdomen assess for paracentesis EXAM: LIMITED ABDOMEN ULTRASOUND FOR ASCITES TECHNIQUE: Limited ultrasound survey for ascites was performed in all four abdominal quadrants. COMPARISON:  10/27/2019 FINDINGS: Small volume of diffuse abdominopelvic ascites, not enough to warrant therapeutic paracentesis. Procedure not performed. Pleural effusions noted bilaterally. IMPRESSION: Small volume abdominopelvic ascites. Electronically Signed   By: Jerilynn Mages.  Shick M.D.   On: 11/04/2019 14:36    Labs: BMET Recent Labs  Lab 10/31/19 0302 11/01/19 0418 11/02/19 0210 11/03/19 0325 11/04/19 0434 11/05/19 0422 11/06/19 0618  NA 135 135 135 136 137 136 136  K 3.7 3.4* 3.6 3.9 3.8 3.8 3.9  CL 96* 99 99 99 99 99 97*  CO2 25 24 24 26 27 27 23   GLUCOSE 118* 136* 123* 115* 135* 118* 120*  BUN 71* 53* 57* 33* 22* 11 21*  CREATININE 4.78* 4.55* 4.71* 3.47* 2.87* 2.28* 3.24*  CALCIUM 8.4* 8.4* 8.6* 8.5* 8.5* 8.3* 8.4*  PHOS 5.7* 5.0* 5.4* 4.6 3.3 2.5 3.8   CBC Recent Labs  Lab 11/03/19  1015 11/04/19 1326 11/05/19 1916 11/06/19 0712  WBC 9.9 7.9  --  9.4  HGB 7.8* 7.7* 7.6* 6.8*  HCT 25.0* 24.8* 24.4* 22.0*  MCV 91.9 92.5  --  94.4  PLT 229 208  --  209    Medications:    . sodium chloride   Intravenous Once  . bisacodyl  10 mg Rectal Once  . brimonidine  1 drop Left Eye BID  . calcium acetate  1,334 mg Oral TID WC  . carvedilol  12.5 mg Oral BID WC  . Chlorhexidine Gluconate Cloth  6 each Topical Q0600  . darbepoetin (ARANESP) injection - NON-DIALYSIS  100 mcg Subcutaneous Q Tue-1800  . desmopressin  0.3 mcg/kg  Intravenous Once  . dorzolamide-timolol  1 drop Left Eye BID  . gatifloxacin  1 drop Left Eye QID  . insulin aspart  0-9 Units Subcutaneous TID WC  . latanoprost  1 drop Left Eye QHS  . senna-docusate  1 tablet Oral BID      Otelia Santee, MD 11/06/2019, 10:56 AM

## 2019-11-06 NOTE — Consult Note (Signed)
Subjective: CC: Urethral bleeding   I was asked to see Christopher Davila by Dr. Tyrell Antonio for urethral bleeding following foley removal.  He has ESRD and is on HD.  He has been passing clots since a foley was removed but make very little urine and the clots are just oozing from the meatus.  He is on no anticoagulants at this time and I asked that nephrology be consulted to consider DDAVP for platelet dysfunction.  The bleeding has improved.  ROS:  Review of Systems  Unable to perform ROS: Medical condition    Allergies  Allergen Reactions  . Morphine And Related Itching and Other (See Comments)    Pt prefers not to be given this drug    Past Medical History:  Diagnosis Date  . Abdominal pain 12/28/2012  . Acute kidney injury superimposed on chronic kidney disease ([place])   . Blindness 09/04/2019  . Chest pain 02/14/2014  . Diabetes mellitus   . Diarrhea 10/14/2019  . Dyspnea   . Epigastric abdominal pain   . Gastroparesis due to DM ([place])   . Hypertension   . Hypoalbuminemia 04/17/2019  . Hypoxia   . Nausea   . Neuropathy of lower extremity   . Oral candidiasis 06/26/2012  . Pancreatitis 06/26/2012  . Weight loss 06/26/2012  . Wheezing     Past Surgical History:  Procedure Laterality Date  . AV FISTULA PLACEMENT Right 10/29/2019   Procedure: RIGHT ARM BRACHIOBASILIC ARTERIOVENOUS (AV) FISTULA CREATION;  Surgeon: Rosetta Posner, MD;  Location: [facility];  Service: Vascular;  Laterality: Right;  . EYE SURGERY    . IR FLUORO GUIDE CV LINE RIGHT  10/28/2019  . IR US GUIDE VASC ACCESS RIGHT  10/28/2019    Social History   Socioeconomic History  . Marital status: Single    Spouse name: Not on file  . Number of children: Not on file  . Years of education: Not on file  . Highest education level: Not on file  Occupational History  . Occupation: dish washer, unemployed  Tobacco Use  . Smoking status: Former Smoker    Packs/day: 0.00    Types: Cigarettes    Quit date: 03/22/2015    Years  since quitting: 4.6  . Smokeless tobacco: Never Used  Substance and Sexual Activity  . Alcohol use: Not Currently  . Drug use: No  . Sexual activity: Not on file  Other Topics Concern  . Not on file  Social History Narrative  . Not on file   Social Determinants of Health   Financial Resource Strain:   . Difficulty of Paying Living Expenses:   Food Insecurity:   . Worried About Charity fundraiser in the Last Year:   . Arboriculturist in the Last Year:   Transportation Needs:   . Film/video editor (Medical):   Marland Kitchen Lack of Transportation (Non-Medical):   Physical Activity:   . Days of Exercise per Week:   . Minutes of Exercise per Session:   Stress:   . Feeling of Stress :   Social Connections:   . Frequency of Communication with Friends and Family:   . Frequency of Social Gatherings with Friends and Family:   . Attends Religious Services:   . Active Member of Clubs or Organizations:   . Attends Archivist Meetings:   Marland Kitchen Marital Status:   Intimate Partner Violence:   . Fear of Current or Ex-Partner:   . Emotionally Abused:   Marland Kitchen Physically  Abused:   . Sexually Abused:     Family History  Problem Relation Age of Onset  . Diabetes Mother   . Lung disease Mother   . Hypertension Mother   . Diabetes Maternal Aunt   . CAD Maternal Aunt   . CAD Cousin     Anti-infectives: Anti-infectives (From admission, onward)   Start     Dose/Rate Route Frequency Ordered Stop   10/29/19 0600  cefUROXime (ZINACEF) 1.5 g in sodium chloride 0.9 % 100 mL IVPB     1.5 g 200 mL/hr over 30 Minutes Intravenous On call to O.R. 10/28/19 0727 10/29/19 1232   10/28/19 0839  ceFAZolin (ANCEF) IVPB 2g/100 mL premix     over 30 Minutes  Continuous PRN 10/28/19 0846 10/28/19 0909   10/28/19 0826  ceFAZolin (ANCEF) 2-4 GM/100ML-% IVPB    Note to Pharmacy: Domenick Bookbinder   : cabinet override      10/28/19 0826 10/28/19 2044   10/28/19 0800  ceFAZolin (ANCEF) IVPB 2g/100 mL premix      2 g 200 mL/hr over 30 Minutes Intravenous To Radiology 10/28/19 0223 10/29/19 0800   10/28/19 0000  ceFAZolin (ANCEF) IVPB 2g/100 mL premix  Status:  Discontinued     2 g 200 mL/hr over 30 Minutes Intravenous To Radiology 10/27/19 1333 10/28/19 0223      Current Facility-Administered Medications  Medication Dose Route Frequency Provider Last Rate Last Admin  . 0.9 %  sodium chloride infusion   Intravenous PRN Ulyses Amor, PA-C 10 mL/hr at 11/06/19 1453 250 mL at 11/06/19 1453  . acetaminophen (TYLENOL) tablet 650 mg  650 mg Oral Q6H PRN Ulyses Amor, PA-C   650 mg at 11/05/19 2222   Or  . acetaminophen (TYLENOL) suppository 650 mg  650 mg Rectal Q6H PRN Laurence Slate M, PA-C      . bisacodyl (DULCOLAX) suppository 10 mg  10 mg Rectal Once Laurence Slate M, PA-C      . brimonidine (ALPHAGAN) 0.15 % ophthalmic solution 1 drop  1 drop Left Eye BID Laurence Slate M, PA-C   1 drop at 11/06/19 7341  . calcium acetate (PHOSLO) capsule 1,334 mg  1,334 mg Oral TID WC Ulyses Amor, PA-C   1,334 mg at 11/06/19 0756  . carvedilol (COREG) tablet 12.5 mg  12.5 mg Oral BID WC Regalado, Belkys A, MD   12.5 mg at 11/06/19 1525  . Chlorhexidine Gluconate Cloth 2 % PADS 6 each  6 each Topical Q0600 Reesa Chew, MD   6 each at 11/06/19 0531  . Darbepoetin Alfa (ARANESP) injection 100 mcg  100 mcg Subcutaneous Q Tue-1800 Pierce, Dwayne A, RPH   100 mcg at 11/02/19 1806  . dorzolamide-timolol (COSOPT) 22.3-6.8 MG/ML ophthalmic solution 1 drop  1 drop Left Eye BID Laurence Slate M, PA-C   1 drop at 11/06/19 0802  . gatifloxacin (ZYMAXID) 0.5 % ophthalmic drops 1 drop  1 drop Left Eye QID Laurence Slate M, PA-C   1 drop at 11/06/19 1457  . heparin sodium (porcine) 1000 UNIT/ML injection           . insulin aspart (novoLOG) injection 0-9 Units  0-9 Units Subcutaneous TID WC Ulyses Amor, PA-C   1 Units at 11/05/19 1629  . latanoprost (XALATAN) 0.005 % ophthalmic solution 1 drop  1 drop Left Eye QHS  Laurence Slate M, [place]   1 drop at 11/05/19 2015  . lidocaine (XYLOCAINE) 2 % viscous mouth solution  15 mL  15 mL Mouth/Throat Q6H PRN Lovey Newcomer T, NP   15 mL at 10/30/19 0525  . ondansetron (ZOFRAN) injection 4 mg  4 mg Intravenous Q6H PRN Lovey Newcomer T, NP   4 mg at 10/30/19 1237  . senna-docusate (Senokot-S) tablet 1 tablet  1 tablet Oral BID Ulyses Amor, PA-C   1 tablet at 11/06/19 0755  . traZODone (DESYREL) tablet 50 mg  50 mg Oral QHS PRN Opyd, Ilene Qua, MD   50 mg at 11/02/19 2202     Objective: Vital signs in last 24 hours: BP (!) 176/115 (BP Location: Left Arm)   Pulse 72   Temp 97.8 F (36.6 C) (Oral)   Resp (!) 30   Ht 5\' 3"  (1.6 m)   Wt 70 kg   SpO2 96%   BMI 27.34 kg/m   Intake/Output from previous day: 05/21 0701 - 05/22 0700 In: 720 [P.O.:720] Out: 50 [Urine:50] Intake/Output this shift: Total I/O In: 630 [Blood:630] Out: -    Physical Exam Vitals reviewed.  Constitutional:      Appearance: He is ill-appearing.  Abdominal:     General: Abdomen is flat.     Palpations: Abdomen is soft. There is no mass.     Tenderness: There is no abdominal tenderness.  Genitourinary:    Comments: He has dark blood slowly oozing from the meatus.  There is mild penile edema but no tenderness and I can't express clots.   Scrotum and contents normal.  Neurological:     Mental Status: He is alert.     Lab Results:  Results for orders placed or performed during the hospital encounter of 10/13/19 (from the past 24 hour(s))  Glucose, capillary     Status: Abnormal   Collection Time: 11/05/19  4:12 PM  Result Value Ref Range   Glucose-Capillary 148 (H) 70 - 99 mg/dL  Hemoglobin and hematocrit, blood     Status: Abnormal   Collection Time: 11/05/19  7:16 PM  Result Value Ref Range   Hemoglobin 7.6 (L) 13.0 - 17.0 g/dL   HCT 24.4 (L) 39.0 - 52.0 %  Glucose, capillary     Status: Abnormal   Collection Time: 11/05/19  9:19 PM  Result Value Ref Range    Glucose-Capillary 144 (H) 70 - 99 mg/dL  Glucose, capillary     Status: Abnormal   Collection Time: 11/06/19  5:37 AM  Result Value Ref Range   Glucose-Capillary 108 (H) 70 - 99 mg/dL  Renal function panel     Status: Abnormal   Collection Time: 11/06/19  6:18 AM  Result Value Ref Range   Sodium 136 135 - 145 mmol/L   Potassium 3.9 3.5 - 5.1 mmol/L   Chloride 97 (L) 98 - 111 mmol/L   CO2 23 22 - 32 mmol/L   Glucose, Bld 120 (H) 70 - 99 mg/dL   BUN 21 (H) 6 - 20 mg/dL   Creatinine, Ser 3.24 (H) 0.61 - 1.24 mg/dL   Calcium 8.4 (L) 8.9 - 10.3 mg/dL   Phosphorus 3.8 2.5 - 4.6 mg/dL   Albumin 2.4 (L) 3.5 - 5.0 g/dL   GFR calc non Af Amer 22 (L) >60 mL/min   GFR calc Af Amer 25 (L) >60 mL/min   Anion gap 16 (H) 5 - 15  CBC     Status: Abnormal   Collection Time: 11/06/19  7:12 AM  Result Value Ref Range   WBC 9.4 4.0 - 10.5 K/uL  RBC 2.33 (L) 4.22 - 5.81 MIL/uL   Hemoglobin 6.8 (LL) 13.0 - 17.0 g/dL   HCT 22.0 (L) 39.0 - 52.0 %   MCV 94.4 80.0 - 100.0 fL   MCH 29.2 26.0 - 34.0 pg   MCHC 30.9 30.0 - 36.0 g/dL   RDW 19.9 (H) 11.5 - 15.5 %   Platelets 209 150 - 400 K/uL   nRBC 0.0 0.0 - 0.2 %  Type and screen [place]     Status: None (Preliminary result)   Collection Time: 11/06/19  9:26 AM  Result Value Ref Range   ABO/RH(D) O POS    Antibody Screen NEG    Sample Expiration 11/09/2019,2359    Unit Number [id number]    Blood Component Type RED CELLS,LR    Unit division 00    Status of Unit ISSUED    Transfusion Status OK TO TRANSFUSE    Crossmatch Result Compatible    Unit Number [id number]    Blood Component Type RED CELLS,LR    Unit division 00    Status of Unit ISSUED    Transfusion Status OK TO TRANSFUSE    Crossmatch Result      Compatible Performed at [facility], [address], [place], [place] [address]   Prepare RBC (crossmatch)     Status: None   Collection Time: 11/06/19 10:18 AM  Result Value Ref Range   Order  Confirmation      ORDER PROCESSED BY BLOOD BANK Performed at [facility], [place] [address] [place], [place] [address]     BMET Recent Labs    11/05/19 0422 11/06/19 0618  NA 136 136  K 3.8 3.9  CL 99 97*  CO2 27 23  GLUCOSE 118* 120*  BUN 11 21*  CREATININE 2.28* 3.24*  CALCIUM 8.3* 8.4*   PT/INR No results for input(s): LABPROT, INR in the last 72 hours. ABG No results for input(s): PHART, HCO3 in the last 72 hours.  Invalid input(s): PCO2, PO2  Studies/Results: No results found.   Assessment/Plan: Urethral bleeding following foley removal.   The bleeding appears to be abating and is just a very slow ooze at this time.   I have placed a Kerlix wrap around the penis to try to gentle compress the urethra.  I don't think we need to replace a foley at this time.  I will continue to monitor.     CC: Dr. Niel Hummer     Irine Seal 11/06/2019 [phone]

## 2019-11-06 NOTE — Progress Notes (Signed)
Patient received 2 units of blood in dialysis today and they removed 2L of fluid.

## 2019-11-07 LAB — BPAM RBC
Blood Product Expiration Date: 202105282359
Blood Product Expiration Date: 202105292359
ISSUE DATE / TIME: 202105221117
ISSUE DATE / TIME: 202105221117
Unit Type and Rh: 5100
Unit Type and Rh: 5100

## 2019-11-07 LAB — CBC
HCT: 29 % — ABNORMAL LOW (ref 39.0–52.0)
Hemoglobin: 9.5 g/dL — ABNORMAL LOW (ref 13.0–17.0)
MCH: 29.5 pg (ref 26.0–34.0)
MCHC: 32.8 g/dL (ref 30.0–36.0)
MCV: 90.1 fL (ref 80.0–100.0)
Platelets: 213 10*3/uL (ref 150–400)
RBC: 3.22 MIL/uL — ABNORMAL LOW (ref 4.22–5.81)
RDW: 18.7 % — ABNORMAL HIGH (ref 11.5–15.5)
WBC: 10.4 10*3/uL (ref 4.0–10.5)
nRBC: 0 % (ref 0.0–0.2)

## 2019-11-07 LAB — RENAL FUNCTION PANEL
Albumin: 2.4 g/dL — ABNORMAL LOW (ref 3.5–5.0)
Anion gap: 9 (ref 5–15)
BUN: 15 mg/dL (ref 6–20)
CO2: 26 mmol/L (ref 22–32)
Calcium: 8.1 mg/dL — ABNORMAL LOW (ref 8.9–10.3)
Chloride: 100 mmol/L (ref 98–111)
Creatinine, Ser: 2.6 mg/dL — ABNORMAL HIGH (ref 0.61–1.24)
GFR calc Af Amer: 33 mL/min — ABNORMAL LOW (ref >60)
GFR calc non Af Amer: 28 mL/min — ABNORMAL LOW (ref >60)
Glucose, Bld: 121 mg/dL — ABNORMAL HIGH (ref 70–99)
Phosphorus: 3.1 mg/dL (ref 2.5–4.6)
Potassium: 3.8 mmol/L (ref 3.5–5.1)
Sodium: 135 mmol/L (ref 135–145)

## 2019-11-07 LAB — TYPE AND SCREEN
ABO/RH(D): O POS
Antibody Screen: NEGATIVE
Unit division: 0
Unit division: 0

## 2019-11-07 LAB — GLUCOSE, CAPILLARY
Glucose-Capillary: 113 mg/dL — ABNORMAL HIGH (ref 70–99)
Glucose-Capillary: 123 mg/dL — ABNORMAL HIGH (ref 70–99)
Glucose-Capillary: 139 mg/dL — ABNORMAL HIGH (ref 70–99)
Glucose-Capillary: 133 mg/dL — ABNORMAL HIGH (ref 70–99)

## 2019-11-07 MED ORDER — SODIUM CHLORIDE 0.9 % IR SOLN: 1000.0000 mL | Status: DC | PRN

## 2019-11-07 NOTE — Progress Notes (Signed)
[facility] Progress Note   New start.  Assessment/ Plan:   1. AKI/CKD stage IV- in setting of acute on chronic diastolic CHF and diuresis. His baselineis3-3.5 and is followed by Dr. Carolin Sicks at [facility]. BUN/Crt climbed and volume status worsened despite diuresis. Started dialysis and tolerating well. HD (5/18UF 3L net, 5/19 2.48 UF);plan onHDTTS regimen.  Leg swelling and  scrotal edema better but still present.  -Dialysis Access:RIJTDC on 5/13.1st stage BBF placed5/14/2021. Plan to follow-up with vascular in 6 weeks.   Last  HD Sat (5/22); plan next HD Tues  - CLIP processing -> waiting for response from [facility]  2. Acute on chronic diastolic CHF-didnot respond to high dose diuretics- Uf with dialysis 3. Urinary retention- s/p foley catheter and removal with less UOP -> hematuria after cath removed (incidental?). Hematuria/ blood clots slowing down and Dr. Jeffie Pollock rec wrap and no 20Fr foley.  Given  dose of DDAVP 5/22   4. Anemia of CKD stage IV- hgbdropped to 6.8 and received a unit of PRBC's at that time and has risen to 9's.Received 2 doses IV feraheme on 5/1 and Aranesp 40 mcg on 4/30. ESA increased to 194mcg on 5/10 and 5/18.  5. DM type 2 with hyperglycemia - per primary svc 6. Bones-phosphorus is improving with dialysis. Continue PhosLo at current dose. - PTH 172phos 5.4 (5/18) 7. Hyponatremia-resolved . Likely hypervolemic in nature. Ultrafiltration with dialysis  Subjective:   Patienttolerated HDyesterday; denies f/c/n/v.Legally blind Patient seen by vascular with plans to follow-up in 6 weeks. Gross Hematuria since 5/21   Objective:   BP (!) 179/93 (BP Location: Left Arm)   Pulse 73   Temp 97.9 F (36.6 C) (Oral)   Resp 18   Ht 5\' 3"  (1.6 m)   Wt 70.8 kg   SpO2 94%   BMI 27.63 kg/m   Intake/Output Summary (Last 24 hours) at 11/07/2019 0818 Last data filed at 11/06/2019 2200 Gross per 24 hour  Intake 1215 ml   Output 0 ml  Net 1215 ml   Weight change: -0.761 kg  Physical Exam: Gen: NAD, lying in bed CVS: no rub, normal rate Resp: CTA BL, no iwob Abd: +BS, soft, NT/ND Ext:1+ edema in BLE but improved GU: scrotal edema improving Access: rt BBFgood bruit, RIJ TC  Imaging: No results found.  Labs: BMET Recent Labs  Lab 11/01/19 0418 11/02/19 0210 11/03/19 0325 11/04/19 0434 11/05/19 0422 11/06/19 0618  NA 135 135 136 137 136 136  K 3.4* 3.6 3.9 3.8 3.8 3.9  CL 99 99 99 99 99 97*  CO2 24 24 26 27 27 23   GLUCOSE 136* 123* 115* 135* 118* 120*  BUN 53* 57* 33* 22* 11 21*  CREATININE 4.55* 4.71* 3.47* 2.87* 2.28* 3.24*  CALCIUM 8.4* 8.6* 8.5* 8.5* 8.3* 8.4*  PHOS 5.0* 5.4* 4.6 3.3 2.5 3.8   CBC Recent Labs  Lab 11/03/19 1015 11/03/19 1015 11/04/19 1326 11/05/19 1916 11/06/19 0712 11/07/19 0707  WBC 9.9  --  7.9  --  9.4 10.4  HGB 7.8*   < > 7.7* 7.6* 6.8* 9.5*  HCT 25.0*   < > 24.8* 24.4* 22.0* 29.0*  MCV 91.9  --  92.5  --  94.4 90.1  PLT 229  --  208  --  209 213   < > = values in this interval not displayed.    Medications:    . bisacodyl  10 mg Rectal Once  . brimonidine  1 drop Left Eye BID  .  calcium acetate  1,334 mg Oral TID WC  . carvedilol  12.5 mg Oral BID WC  . Chlorhexidine Gluconate Cloth  6 each Topical Q0600  . darbepoetin (ARANESP) injection - NON-DIALYSIS  100 mcg Subcutaneous Q Tue-1800  . dorzolamide-timolol  1 drop Left Eye BID  . gatifloxacin  1 drop Left Eye QID  . insulin aspart  0-9 Units Subcutaneous TID WC  . latanoprost  1 drop Left Eye QHS  . senna-docusate  1 tablet Oral BID      Otelia Santee, MD 11/07/2019, 8:18 AM

## 2019-11-07 NOTE — Progress Notes (Signed)
PROGRESS NOTE    Christopher Davila  OJJ:[id number] DOB: February 22, 1971 DOA: 10/13/2019 PCP: Marliss Coots, NP   Brief Narrative: 49 year old with past medical history significant for hypertension, diabetes, gastroparesis, blindness, CKD stage IV, chronic diastolic heart failure admitted with diarrhea, weakness and failure to thrive.  He was recently admitted on 4/10--12 for acute on chronic diastolic heart failure.    He presented on 4/28 and was found to have severe acidosis with a gap, received bicarb IV fluid and was admitted.  He was having diarrhea and has since then resolved.  C diff and GI pathogen was negative.  Patient subsequently developed progressive fluid overload with lower extremity edema and acute on chronic diastolic heart failure.  He was a started on Lasix drip and metolazone but remain fluid overloaded with oliguria and uremic symptoms.  Patient had TDC and is started hemodialysis on 10/28/2019.  Patient had right arm AV graft on 14.  Hemodialysis is switched from M,W,F to TTS due to census.  Patient is still volume overload/    Assessment & Plan:   Principal Problem:   Diarrhea Active Problems:   Type 2 diabetes mellitus with hypoglycemia (HCC)   Essential hypertension   Hyperlipidemia   CKD (chronic kidney disease) stage 4, GFR 15-29 ml/min (HCC)   Weakness   Metabolic acidosis, increased anion gap   Acute urinary retention   ESRD on dialysis (HCC)   Heart failure with preserved ejection fraction (HCC)   Goals of care, counseling/discussion   Palliative care encounter   Pressure injury of skin  1-Acute Hypoxic Respiratory Failure due to Acute on Chronic Diastolic Heart Failure Exacerbation: Volume overload: -Improved, but is still requiring hemodialysis for volume management -Continue with hemodialysis. - 7 Liters -Clip process in progress.   Acute on chronic diastolic heart failure exacerbation, volume overload in the setting of renal failure Patient did not  respond to IV diuresis Started hemodialysis Volume management with HD>  Abdomen distended. [place] negative for ascites.   AKI on CKD stage IV progressing to end-stage renal disease oliguria now new dialysis Right arm aVF on 5/14 need to follow-up with [facility] in 6 weeks. TDC and started on hemodialysis on 5/13 HD TTS  Acute blood loss anemia; and anemia of chronic renal diseases. Urethral bleeding Hb down to 6. S/P Transfuse 2 units PRBC 5/22 Hb today at 9.  Appreciate Dr Roni Bread evaluation. Hold on placing foley.  Continue to have mild urethral bleeding. Plan is to continue to monitor. If doesn't resolved might need cystoscopy with fulguration.   Gap metabolic acidosis due to renal failure: Resolved.  Hyperphosphatemia: Improved  Acute metabolic encephalopathy: Related to uremia Continue to hold gabapentin Still with confusion and hallucination at times. ammonia mildly elevated (38). Received a dose of lactulose. Ammonia decreased to 37/    Acute urinary retention: No significant urine out put. After removing catheter patient develops bleeding through urethra.   Hypothermia: Resolved.  Diabetes type 2: Continue with low-dose Lantus and sliding scale insulin  Hypertension; Elevated, start low dose carvedilol.   Hyperlipidemia Continue with statins.  Diarrhea Resolved   Goals of care; Patient with significant comorbidity, likely plan with social barrier now on dialysis.. Palliative was consulted  Estimated body mass index is 27.63 kg/m as calculated from the following:   Height as of this encounter: 5\' 3"  (1.6 m).   Weight as of this encounter: 70.8 kg.   DVT prophylaxis: Heparin Code Status: Full code Family Communication: No family at bedside.  Disposition  Plan:  Status is: Inpatient  Remains inpatient appropriate because:Altered mental status   Dispo: The patient is from: Home              Anticipated d/c is to: SNF              Anticipated d/c date is: 1 day               Patient currently is not medically stable to d/c.  Requiring hemodialysis for volume management    Consultants:   Nephrology  IR  PMT  Vascular  Procedures:   Hemodialysis  Antimicrobials:  None  Subjective: Mild bleeding form urethra . Denies pain.  Sleepy   Objective: Vitals:   11/06/19 1618 11/06/19 2015 11/07/19 0518 11/07/19 0539  BP: (!) 162/99 (!) 152/97 (!) 171/101   Pulse: 74 75 71   Resp: 18 18 20    Temp: 00.9 F (36.9 C) 98 F (36.7 C) 98.5 F (36.9 C)   TempSrc: Oral Oral Oral   SpO2: (!) 85% 99% 96%   Weight:    70.8 kg  Height:        Intake/Output Summary (Last 24 hours) at 11/07/2019 0658 Last data filed at 11/06/2019 2200 Gross per 24 hour  Intake 1215 ml  Output 0 ml  Net 1215 ml   Filed Weights   11/06/19 0103 11/06/19 1325 11/07/19 0539  Weight: 70.8 kg 70 kg 70.8 kg    Examination:  General exam: NAD, Legally blind Respiratory system: CTA Cardiovascular system: S 1, S 2 RRR Gastrointestinal system: BS present, soft, nt GU; less scrotal edema, bleeding from urethra Central nervous system; sleepy Extremities plus 2 edema   Data Reviewed: I have personally reviewed following labs and imaging studies  CBC: Recent Labs  Lab 11/03/19 1015 11/04/19 1326 11/05/19 1916 11/06/19 0712  WBC 9.9 7.9  --  9.4  HGB 7.8* 7.7* 7.6* 6.8*  HCT 25.0* 24.8* 24.4* 22.0*  MCV 91.9 92.5  --  94.4  PLT 229 208  --  381   Basic Metabolic Panel: Recent Labs  Lab 11/02/19 0210 11/03/19 0325 11/04/19 0434 11/05/19 0422 11/06/19 0618  NA 135 136 137 136 136  K 3.6 3.9 3.8 3.8 3.9  CL 99 99 99 99 97*  CO2 24 26 27 27 23   GLUCOSE 123* 115* 135* 118* 120*  BUN 57* 33* 22* 11 21*  CREATININE 4.71* 3.47* 2.87* 2.28* 3.24*  CALCIUM 8.6* 8.5* 8.5* 8.3* 8.4*  PHOS 5.4* 4.6 3.3 2.5 3.8   GFR: Estimated Creatinine Clearance: 24.9 mL/min (A) (by C-G formula based on SCr of 3.24 mg/dL (H)). Liver Function Tests: Recent Labs  Lab  11/02/19 0210 11/03/19 0325 11/04/19 0434 11/05/19 0422 11/06/19 0618  ALBUMIN 2.2* 2.4* 2.3* 2.4* 2.4*   No results for input(s): LIPASE, AMYLASE in the last 168 hours. Recent Labs  Lab 11/03/19 0913 11/05/19 0422  AMMONIA 38* 37*   Coagulation Profile: No results for input(s): INR, PROTIME in the last 168 hours. Cardiac Enzymes: No results for input(s): CKTOTAL, CKMB, CKMBINDEX, TROPONINI in the last 168 hours. BNP (last 3 results) No results for input(s): PROBNP in the last 8760 hours. HbA1C: No results for input(s): HGBA1C in the last 72 hours. CBG: Recent Labs  Lab 11/05/19 2119 11/06/19 0537 11/06/19 1615 11/06/19 2116 11/07/19 0649  GLUCAP 144* 108* 119* 180* 113*   Lipid Profile: No results for input(s): CHOL, HDL, LDLCALC, TRIG, CHOLHDL, LDLDIRECT in the last 72 hours. Thyroid Function  Tests: No results for input(s): TSH, T4TOTAL, FREET4, T3FREE, THYROIDAB in the last 72 hours. Anemia Panel: No results for input(s): VITAMINB12, FOLATE, FERRITIN, TIBC, IRON, RETICCTPCT in the last 72 hours. Sepsis Labs: No results for input(s): PROCALCITON, LATICACIDVEN in the last 168 hours.  Recent Results (from the past 240 hour(s))  Surgical pcr screen     Status: Abnormal   Collection Time: 10/28/19 11:30 PM   Specimen: Nasal Mucosa; Nasal Swab  Result Value Ref Range Status   MRSA, PCR POSITIVE (A) NEGATIVE Final    Comment: RESULT CALLED TO, READ BACK BY AND VERIFIED WITH: NITURADA,L RN 10/29/2019 AT 0259 SKEEN,P    Staphylococcus aureus POSITIVE (A) NEGATIVE Final    Comment: (NOTE) The Xpert SA Assay (FDA approved for NASAL specimens in patients 24 years of age and older), is one component of a comprehensive surveillance program. It is not intended to diagnose infection nor to guide or monitor treatment.          Radiology Studies: No results found.      Scheduled Meds: . bisacodyl  10 mg Rectal Once  . brimonidine  1 drop Left Eye BID  .  calcium acetate  1,334 mg Oral TID WC  . carvedilol  12.5 mg Oral BID WC  . Chlorhexidine Gluconate Cloth  6 each Topical Q0600  . darbepoetin (ARANESP) injection - NON-DIALYSIS  100 mcg Subcutaneous Q Tue-1800  . dorzolamide-timolol  1 drop Left Eye BID  . gatifloxacin  1 drop Left Eye QID  . insulin aspart  0-9 Units Subcutaneous TID WC  . latanoprost  1 drop Left Eye QHS  . senna-docusate  1 tablet Oral BID   Continuous Infusions: . sodium chloride 250 mL (11/06/19 1453)     LOS: 23 days    Time spent: 35 minutes    Belkys A Regalado, MD [facility]   If 7PM-7AM, please contact night-coverage [email or web address]  11/07/2019, 6:58 AM

## 2019-11-07 NOTE — Progress Notes (Addendum)
Patient ID: Christopher Davila, male   DOB: Jan 03, 1971, 49 y.o.   MRN: [id number] 9 Days Post-Op  Subjective: He continues to have minimal oozing from the urethra and no significant pain. He has been bladder scanned and found to have 364ml in the bladder.  He has some urinary urgency but hasn't been able to void.  His nurse reports having to replace the dressing several times.  ROS:  ROS O/W negative.  No fever.  No penile pain.   Anti-infectives: Anti-infectives (From admission, onward)    Start     Dose/Rate Route Frequency Ordered Stop   10/29/19 0600  cefUROXime (ZINACEF) 1.5 g in sodium chloride 0.9 % 100 mL IVPB     1.5 g 200 mL/hr over 30 Minutes Intravenous On call to O.R. 10/28/19 0727 10/29/19 1232   10/28/19 0839  ceFAZolin (ANCEF) IVPB 2g/100 mL premix     over 30 Minutes  Continuous PRN 10/28/19 0846 10/28/19 0909   10/28/19 0826  ceFAZolin (ANCEF) 2-4 GM/100ML-% IVPB    Note to Pharmacy: Domenick Bookbinder   : cabinet override      10/28/19 0826 10/28/19 2044   10/28/19 0800  ceFAZolin (ANCEF) IVPB 2g/100 mL premix     2 g 200 mL/hr over 30 Minutes Intravenous To Radiology 10/28/19 0223 10/29/19 0800   10/28/19 0000  ceFAZolin (ANCEF) IVPB 2g/100 mL premix  Status:  Discontinued     2 g 200 mL/hr over 30 Minutes Intravenous To Radiology 10/27/19 1333 10/28/19 0223       Current Facility-Administered Medications  Medication Dose Route Frequency Provider Last Rate Last Admin  . 0.9 %  sodium chloride infusion   Intravenous PRN Ulyses Amor, PA-C 10 mL/hr at 11/06/19 1453 250 mL at 11/06/19 1453  . acetaminophen (TYLENOL) tablet 650 mg  650 mg Oral Q6H PRN Ulyses Amor, PA-C   650 mg at 11/07/19 4098   Or  . acetaminophen (TYLENOL) suppository 650 mg  650 mg Rectal Q6H PRN Laurence Slate M, PA-C      . bisacodyl (DULCOLAX) suppository 10 mg  10 mg Rectal Once Laurence Slate M, PA-C      . brimonidine (ALPHAGAN) 0.15 % ophthalmic solution 1 drop  1 drop Left Eye BID Laurence Slate M, PA-C   1 drop at 11/07/19 1191  . calcium acetate (PHOSLO) capsule 1,334 mg  1,334 mg Oral TID WC Ulyses Amor, PA-C   1,334 mg at 11/07/19 0827  . carvedilol (COREG) tablet 12.5 mg  12.5 mg Oral BID WC Regalado, Belkys A, MD   12.5 mg at 11/07/19 0827  . Chlorhexidine Gluconate Cloth 2 % PADS 6 each  6 each Topical Q0600 Reesa Chew, MD   6 each at 11/07/19 0602  . Darbepoetin Alfa (ARANESP) injection 100 mcg  100 mcg Subcutaneous Q Tue-1800 Pierce, Dwayne A, RPH   100 mcg at 11/02/19 1806  . dorzolamide-timolol (COSOPT) 22.3-6.8 MG/ML ophthalmic solution 1 drop  1 drop Left Eye BID Laurence Slate M, [place]   1 drop at 11/07/19 4782  . gatifloxacin (ZYMAXID) 0.5 % ophthalmic drops 1 drop  1 drop Left Eye QID Laurence Slate M, PA-C   1 drop at 11/07/19 9562  . insulin aspart (novoLOG) injection 0-9 Units  0-9 Units Subcutaneous TID WC Ulyses Amor, PA-C   1 Units at 11/05/19 1629  . latanoprost (XALATAN) 0.005 % ophthalmic solution 1 drop  1 drop Left Eye QHS Ulyses Amor, [place]  1 drop at 11/06/19 2056  . lidocaine (XYLOCAINE) 2 % viscous mouth solution 15 mL  15 mL Mouth/Throat Q6H PRN Lovey Newcomer T, NP   15 mL at 10/30/19 0525  . ondansetron (ZOFRAN) injection 4 mg  4 mg Intravenous Q6H PRN Lovey Newcomer T, NP   4 mg at 10/30/19 1237  . senna-docusate (Senokot-S) tablet 1 tablet  1 tablet Oral BID Ulyses Amor, PA-C   1 tablet at 11/06/19 0755  . traZODone (DESYREL) tablet 50 mg  50 mg Oral QHS PRN Opyd, Ilene Qua, MD   50 mg at 11/02/19 2202     Objective: Vital signs in last 24 hours: Temp:  [97.8 F (36.6 C)-98.5 F (36.9 C)] 97.9 F (36.6 C) (05/23 0744) Pulse Rate:  [71-76] 73 (05/23 0744) Resp:  [18-30] 18 (05/23 0744) BP: (123-179)/(61-115) 179/93 (05/23 0744) SpO2:  [85 %-99 %] 94 % (05/23 0744) Weight:  [70 kg-70.8 kg] 70.8 kg (05/23 0539)  Intake/Output from previous day: 05/22 0701 - 05/23 0700 In: 1215 [P.O.:480; I.V.:55; Blood:630; IV  Piggyback:50] Out: 0  Intake/Output this shift: Total I/O In: 240 [P.O.:240] Out: 1 [Stool:1]   Physical Exam: mild urethral oozing with a small clot in the urethra that came out as the dressing was removed.   Lab Results:  Recent Labs    11/06/19 0712 11/07/19 0707  WBC 9.4 10.4  HGB 6.8* 9.5*  HCT 22.0* 29.0*  PLT 209 213   BMET Recent Labs    11/06/19 0618 11/07/19 0706  NA 136 135  K 3.9 3.8  CL 97* 100  CO2 23 26  GLUCOSE 120* 121*  BUN 21* 15  CREATININE 3.24* 2.60*  CALCIUM 8.4* 8.1*   PT/INR No results for input(s): LABPROT, INR in the last 72 hours. ABG No results for input(s): PHART, HCO3 in the last 72 hours.  Invalid input(s): PCO2, PO2  Studies/Results: No results found. Foley placement:  He was prepped with betadine and the urethra was instilled with lubricating jelly.  An initially attempt at placement of a 27fr foley was not successful because of an obstruction in the penile urethra.  A 67fr foley was the passed with some resistance at the same point but successful entry into the bladder.  The catheter balloon was filled with 61ml of water and the catheter was placed to a drainage bag with return of several hundred ml of dark bloody urine without clots.  I will have the catheter irrigated by the nursing staff.   Assessment and Plan: Urethral bleeding with a possible mid urethral stricture.  I question whether the prior foley balloon had been blown up in the urethra and that is why he bled on removal.   Hopefully the new foley will tampenade the urethral bleeding.  He will need irrigation prn.      LOS: 23 days    Irine Seal 11/07/2019 [phone]

## 2019-11-08 LAB — CBC
HCT: 29.1 % — ABNORMAL LOW (ref 39.0–52.0)
Hemoglobin: 9.3 g/dL — ABNORMAL LOW (ref 13.0–17.0)
MCH: 29.7 pg (ref 26.0–34.0)
MCHC: 32 g/dL (ref 30.0–36.0)
MCV: 93 fL (ref 80.0–100.0)
Platelets: 223 10*3/uL (ref 150–400)
RBC: 3.13 MIL/uL — ABNORMAL LOW (ref 4.22–5.81)
RDW: 18.8 % — ABNORMAL HIGH (ref 11.5–15.5)
WBC: 10.9 10*3/uL — ABNORMAL HIGH (ref 4.0–10.5)
nRBC: 0 % (ref 0.0–0.2)

## 2019-11-08 LAB — RENAL FUNCTION PANEL
Albumin: 2.5 g/dL — ABNORMAL LOW (ref 3.5–5.0)
Anion gap: 11 (ref 5–15)
BUN: 22 mg/dL — ABNORMAL HIGH (ref 6–20)
CO2: 24 mmol/L (ref 22–32)
Calcium: 8.4 mg/dL — ABNORMAL LOW (ref 8.9–10.3)
Chloride: 100 mmol/L (ref 98–111)
Creatinine, Ser: 3.2 mg/dL — ABNORMAL HIGH (ref 0.61–1.24)
GFR calc Af Amer: 25 mL/min — ABNORMAL LOW (ref >60)
GFR calc non Af Amer: 22 mL/min — ABNORMAL LOW (ref >60)
Glucose, Bld: 100 mg/dL — ABNORMAL HIGH (ref 70–99)
Phosphorus: 3.8 mg/dL (ref 2.5–4.6)
Potassium: 3.5 mmol/L (ref 3.5–5.1)
Sodium: 135 mmol/L (ref 135–145)

## 2019-11-08 LAB — GLUCOSE, CAPILLARY
Glucose-Capillary: 100 mg/dL — ABNORMAL HIGH (ref 70–99)
Glucose-Capillary: 104 mg/dL — ABNORMAL HIGH (ref 70–99)
Glucose-Capillary: 155 mg/dL — ABNORMAL HIGH (ref 70–99)
Glucose-Capillary: 198 mg/dL — ABNORMAL HIGH (ref 70–99)

## 2019-11-08 MED ORDER — GABAPENTIN 100 MG PO CAPS
100.0000 mg | ORAL_CAPSULE | Freq: Every day | ORAL | Status: DC
  Administered 2019-11-08 – 2019-11-19 (×12): 100 mg via ORAL
  Filled 2019-11-08 (×12): qty 1

## 2019-11-08 MED ORDER — TORSEMIDE 100 MG PO TABS
100.0000 mg | ORAL_TABLET | Freq: Every day | ORAL | Status: DC
  Administered 2019-11-08 – 2019-11-19 (×12): 100 mg via ORAL
  Filled 2019-11-08 (×12): qty 1

## 2019-11-08 NOTE — Progress Notes (Signed)
PROGRESS NOTE    Christopher Davila  VCB:[id number] DOB: 06-26-1970 DOA: 10/13/2019 PCP: Marliss Coots, NP   Brief Narrative: 49 year old with past medical history significant for hypertension, diabetes, gastroparesis, blindness, CKD stage IV, chronic diastolic heart failure admitted with diarrhea, weakness and failure to thrive.  He was recently admitted on 4/10--12 for acute on chronic diastolic heart failure.    He presented on 4/28 and was found to have severe acidosis with a gap, received bicarb IV fluid and was admitted.  He was having diarrhea and has since then resolved.  C diff and GI pathogen was negative.  Patient subsequently developed progressive fluid overload with lower extremity edema and acute on chronic diastolic heart failure.  He was a started on Lasix drip and metolazone but remain fluid overloaded with oliguria and uremic symptoms.  Patient had TDC and is started hemodialysis on 10/28/2019.  Patient had right arm AV graft on 14.  Hemodialysis is switched from M,W,F to TTS due to census.  Patient is still volume overload/  Patient develops urethral bleed after foley catheter was removed. Patient hb drop to 6. He received 2 units PRBC. Received DDAVP. Foley catheter was placed again. Urology consulted. Might need cystoscopy if bleeding persist.    Assessment & Plan:   Principal Problem:   Diarrhea Active Problems:   Type 2 diabetes mellitus with hypoglycemia (HCC)   Essential hypertension   Hyperlipidemia   CKD (chronic kidney disease) stage 4, GFR 15-29 ml/min (HCC)   Weakness   Metabolic acidosis, increased anion gap   Acute urinary retention   ESRD on dialysis (HCC)   Heart failure with preserved ejection fraction (HCC)   Goals of care, counseling/discussion   Palliative care encounter   Pressure injury of skin  1-Acute Hypoxic Respiratory Failure due to Acute on Chronic Diastolic Heart Failure Exacerbation: Volume overload: -Improved, but is still requiring  hemodialysis for volume management -Continue with hemodialysis. - Negative 8 Liters -Clip process in progress.   Acute on chronic diastolic heart failure exacerbation, volume overload in the setting of renal failure Patient did not respond to IV diuresis Started hemodialysis Volume management with HD>  Abdomen distended. [place] negative for ascites.   AKI on CKD stage IV progressing to end-stage renal disease oliguria now new dialysis Right arm aVF on 5/14 need to follow-up with [facility] in 6 weeks. TDC and started on hemodialysis on 5/13 HD TTS  Acute blood loss anemia; and anemia of chronic renal diseases. Urethral bleeding Hb down to 6. S/P Transfuse 2 units PRBC 5/22 Hb today at 9.  Appreciate Dr Roni Bread evaluation. Hold on placing foley.  Continue to have mild urethral bleeding. Plan is to continue to monitor. If doesn't resolved might need cystoscopy with fulguration.  Foley catheter placed 5/23. Having less bleeding.   Gap metabolic acidosis due to renal failure: Resolved.  Hyperphosphatemia: Improved  Acute metabolic encephalopathy: Related to uremia Still with confusion and hallucination at times. ammonia mildly elevated (38). Received a dose of lactulose. Ammonia decreased to 37/  Will resume low dose gabapentin.   Acute urinary retention: No significant urine out put. After removing catheter patient develops bleeding through urethra.   Hypothermia: Resolved.  Diabetes type 2: Continue with low-dose Lantus and sliding scale insulin  Hypertension; Elevated, start low dose carvedilol.   Hyperlipidemia Continue with statins.  Diarrhea Resolved   Goals of care; Patient with significant comorbidity, likely plan with social barrier now on dialysis.. Palliative was consulted  Estimated body mass index  is 28.34 kg/m as calculated from the following:   Height as of this encounter: 5\' 3"  (1.6 m).   Weight as of this encounter: 72.6 kg.   DVT prophylaxis: Heparin Code  Status: Full code Family Communication: No family at bedside.  Disposition Plan:  Status is: Inpatient  Remains inpatient appropriate because:Altered mental status   Dispo: The patient is from: Home              Anticipated d/c is to: SNF vs [facility]              Anticipated d/c date is: 2              Patient currently is not medically stable to d/c.  Requiring hemodialysis for volume management    Consultants:   Nephrology  IR  PMT  Vascular  Procedures:   Hemodialysis  Antimicrobials:  None  Subjective: Alert, he needs his gabapentin.  He denies pain.  Has foley catheter , less bleeding   Objective: Vitals:   11/07/19 1654 11/07/19 2048 11/08/19 0321 11/08/19 0326  BP: (!) 161/92 (!) 165/89 (!) 180/95   Pulse: 73 73 72   Resp: 18 18 20    Temp: 98.4 F (36.9 C) 98.1 F (36.7 C) 98.1 F (36.7 C)   TempSrc: Oral Oral Oral   SpO2: 92% 94% 100%   Weight:    72.6 kg  Height:        Intake/Output Summary (Last 24 hours) at 11/08/2019 5366 Last data filed at 11/08/2019 0326 Gross per 24 hour  Intake 460 ml  Output 626 ml  Net -166 ml   Filed Weights   11/06/19 1325 11/07/19 0539 11/08/19 0326  Weight: 70 kg 70.8 kg 72.6 kg    Examination:  General exam: NAD  Legally blind Respiratory system: CTA Cardiovascular system: S 1, S 2 RRR Gastrointestinal system: BS present, soft,  nt GU; foley in place, less bleeding form urethra Central nervous system; Alert Extremities; trace edema   Data Reviewed: I have personally reviewed following labs and imaging studies  CBC: Recent Labs  Lab 11/03/19 1015 11/04/19 1326 11/05/19 1916 11/06/19 0712 11/07/19 0707  WBC 9.9 7.9  --  9.4 10.4  HGB 7.8* 7.7* 7.6* 6.8* 9.5*  HCT 25.0* 24.8* 24.4* 22.0* 29.0*  MCV 91.9 92.5  --  94.4 90.1  PLT 229 208  --  209 440   Basic Metabolic Panel: Recent Labs  Lab 11/03/19 0325 11/04/19 0434 11/05/19 0422 11/06/19 0618 11/07/19 0706  NA 136 137 136 136 135  K  3.9 3.8 3.8 3.9 3.8  CL 99 99 99 97* 100  CO2 26 27 27 23 26   GLUCOSE 115* 135* 118* 120* 121*  BUN 33* 22* 11 21* 15  CREATININE 3.47* 2.87* 2.28* 3.24* 2.60*  CALCIUM 8.5* 8.5* 8.3* 8.4* 8.1*  PHOS 4.6 3.3 2.5 3.8 3.1   GFR: Estimated Creatinine Clearance: 31.4 mL/min (A) (by C-G formula based on SCr of 2.6 mg/dL (H)). Liver Function Tests: Recent Labs  Lab 11/03/19 0325 11/04/19 0434 11/05/19 0422 11/06/19 0618 11/07/19 0706  ALBUMIN 2.4* 2.3* 2.4* 2.4* 2.4*   No results for input(s): LIPASE, AMYLASE in the last 168 hours. Recent Labs  Lab 11/03/19 0913 11/05/19 0422  AMMONIA 38* 37*   Coagulation Profile: No results for input(s): INR, PROTIME in the last 168 hours. Cardiac Enzymes: No results for input(s): CKTOTAL, CKMB, CKMBINDEX, TROPONINI in the last 168 hours. BNP (last 3 results) No results for  input(s): PROBNP in the last 8760 hours. HbA1C: No results for input(s): HGBA1C in the last 72 hours. CBG: Recent Labs  Lab 11/07/19 0649 11/07/19 1115 11/07/19 1646 11/07/19 2044 11/08/19 0610  GLUCAP 113* 123* 139* 133* 100*   Lipid Profile: No results for input(s): CHOL, HDL, LDLCALC, TRIG, CHOLHDL, LDLDIRECT in the last 72 hours. Thyroid Function Tests: No results for input(s): TSH, T4TOTAL, FREET4, T3FREE, THYROIDAB in the last 72 hours. Anemia Panel: No results for input(s): VITAMINB12, FOLATE, FERRITIN, TIBC, IRON, RETICCTPCT in the last 72 hours. Sepsis Labs: No results for input(s): PROCALCITON, LATICACIDVEN in the last 168 hours.  No results found for this or any previous visit (from the past 240 hour(s)).       Radiology Studies: No results found.      Scheduled Meds: . bisacodyl  10 mg Rectal Once  . brimonidine  1 drop Left Eye BID  . calcium acetate  1,334 mg Oral TID WC  . carvedilol  12.5 mg Oral BID WC  . Chlorhexidine Gluconate Cloth  6 each Topical Q0600  . darbepoetin (ARANESP) injection - NON-DIALYSIS  100 mcg Subcutaneous  Q Tue-1800  . dorzolamide-timolol  1 drop Left Eye BID  . gatifloxacin  1 drop Left Eye QID  . insulin aspart  0-9 Units Subcutaneous TID WC  . latanoprost  1 drop Left Eye QHS  . senna-docusate  1 tablet Oral BID   Continuous Infusions: . sodium chloride 250 mL (11/06/19 1453)     LOS: 24 days    Time spent: 35 minutes    Belkys A Regalado, MD [facility]   If 7PM-7AM, please contact night-coverage [email or web address]  11/08/2019, 7:22 AM

## 2019-11-08 NOTE — Progress Notes (Signed)
Occupational Therapy Treatment Patient Details Name: Christopher Davila MRN: [id number] DOB: 15-Aug-1970 Today's Date: 11/08/2019    History of present illness Pt is an 49 y.o. male with PMH DM, HTN, CKD stage IIIb, peripheral neuropathy, diabetic retinopathy, bilateral vision loss, glaucoma, chronic diastolic heart failure who presented to ED with increased weakness.   OT comments  Pt seen for skilled co-tx with PT this session in order to safely progress self care tasks and transfers. Pt standing from EOB with min of 1 with assist from second person to manage equipment. Pt notified therapist of need for BM. Pt pivoting onto BSC with min A and use of RW and min cuing secondary to visual deficits. Pt able to have BM and stands with min A for several minutes while second helpers assists with hygiene. Pt taking several steps forward with RW with min A to sit in recliner chair. Pt initially on 3.5 L and decreased to 2.5 L with pt maintaining 96% at end of session. Pt continues to benefit from OT intervention to address functional deficits.   Follow Up Recommendations  Home health OT    Equipment Recommendations  3 in 1 bedside commode       Precautions / Restrictions Precautions Precautions: Fall Precaution Comments: blind; scrotal swelling Restrictions Weight Bearing Restrictions: No       Mobility Bed Mobility Overal bed mobility: Needs Assistance Bed Mobility: Supine to Sit       Sit to supine: Supervision   General bed mobility comments: pt able to move to sitting EOB without assist, with supervision for line management  Transfers Overall transfer level: Needs assistance Equipment used: Rolling walker (2 wheeled) Transfers: Sit to/from [facility] Sit to Stand: Min assist Stand pivot transfers: Min guard       General transfer comment: minA initially to stand from bed, progressed to minG with RW through session. VC for hand placement initially, pt able to  progress to not needing VC. cues for direction due to pt blindness. pt with good safety with movement and pivot    Balance Overall balance assessment: Needs assistance Sitting-balance support: Feet supported Sitting balance-Leahy Scale: Good Sitting balance - Comments: pt able to maintain without UE support, but does rest with UE support   Standing balance support: Bilateral upper extremity supported Standing balance-Leahy Scale: Poor Standing balance comment: reliant on external support        ADL either performed or assessed with clinical judgement        Vision Baseline Vision/History: Legally blind Patient Visual Report: No change from baseline            Cognition Arousal/Alertness: Awake/alert Behavior During Therapy: Flat affect Overall Cognitive Status: Within Functional Limits for tasks assessed      General Comments: Pt able to follow commands with slightly increased time, but did not require additional cues. Pt with good, slow movements with attention to safety. generally flat affect but cooperative and agreeable through session              General Comments VSS on 3L O2. Pt initially on 3.5L, reduced to 2.5L through session which pt maintained SpO2 90-92%, so increased to 3L prior to end of session, pt SpO2 95-97% on 3L    Pertinent Vitals/ Pain       Pain Assessment: No/denies pain Pain Intervention(s): Limited activity within patient's tolerance;Monitored during session;Repositioned         Frequency  Min 2X/week  Progress Toward Goals  OT Goals(current goals can now be found in the care plan section)  Progress towards OT goals: Progressing toward goals  Acute Rehab OT Goals Patient Stated Goal: get better OT Goal Formulation: With patient Time For Goal Achievement: 11/22/19 Potential to Achieve Goals: Good  Plan Discharge plan remains appropriate    Co-evaluation      Reason for Co-Treatment: For patient/therapist safety;To  address functional/ADL transfers PT goals addressed during session: Mobility/safety with mobility;Balance OT goals addressed during session: ADL's and self-care;Proper use of Adaptive equipment and DME      AM-PAC OT "6 Clicks" Daily Activity     Outcome Measure   Help from another person eating meals?: A Little Help from another person taking care of personal grooming?: A Little Help from another person toileting, which includes using toliet, bedpan, or urinal?: A Lot Help from another person bathing (including washing, rinsing, drying)?: A Lot Help from another person to put on and taking off regular upper body clothing?: A Little Help from another person to put on and taking off regular lower body clothing?: A Lot 6 Click Score: 2    End of Session Equipment Utilized During Treatment: Gait belt;Rolling walker;Oxygen(BSC)  OT Visit Diagnosis: Repeated falls (R29.6);Other symptoms and signs involving cognitive function   Activity Tolerance Patient tolerated treatment well   Patient Left in chair;with call bell/phone within reach;with chair alarm set   Nurse Communication Mobility status        Time: 7035-0093 OT Time Calculation (min): 33 min  Charges: OT General Charges $OT Visit: 1 Visit OT Treatments $Self Care/Home Management : 8-22 mins  Marshae Azam P , MS, OTR/L 11/08/2019, 4:16 PM

## 2019-11-08 NOTE — Progress Notes (Signed)
Renal Navigator checked with [facility] Admissions regarding financial clearance-patient has still not been financially cleared to start at the OP HD clinic. Per CSW, patient can discharge home with [facility] Navigator has previously applied for [facility] [place] for transportation and has sent message today to confirm that this is ready to use at discharge. Navigator will continue to follow closely.    Alphonzo Cruise, [place]  Renal Navigator [phone]

## 2019-11-08 NOTE — Progress Notes (Addendum)
[facility] Progress Note   New start.  Assessment/ Plan:   1. AKI/CKD stage IV- in setting of acute on chronic diastolic CHF and diuresis. His baselineis3-3.5 and is followed by Dr. Carolin Sicks at [facility]. BUN/Crt climbed and volume status worsened despite diuresis. Started dialysis and tolerating well. HD (5/18UF 3L net, 5/19 2.48 UF);plan onHDTTS regimen.  Leg swelling and  scrotal edema better but still present.  -Dialysis Access:RIJTDC on 5/13.1st stage BBF placed5/14/2021. Plan to follow-up with vascular in 6 weeks.   Last  HD Sat (5/22); plan next HD Tues  - CLIP processing -> waiting for response from [facility]  2. Acute on chronic diastolic CHF-didnot respond to high dose diuretics- Uf with dialysis 3. Urinary retention- s/p foley catheter and removal with less UOP -> hematuria after cath removed (incidental?). Hematuria/ blood clots slowing down and Dr. Jeffie Pollock rec wrap and no 20Fr foley; thinks perhaps mid uretheral stricture.   4. Anemia of CKD stage IV- hgbdropped to 6.8 and received a unit of PRBC's at that time and has risen to 9's.Received 2 doses IV feraheme on 5/1 and Aranesp 40 mcg on 4/30. ESA increased to 134mcg on 5/10 and 5/18.  5. DM type 2 with hyperglycemia - per primary svc 6. Bones-phosphorus is improving with dialysis. Continue PhosLo at current dose. - PTH 172phos 5.4 (5/18) 7. Hypertension:  On coreg 12.5 BID, quite hypertensive this AM but has been labile.  Monitor with UF and give torsemide 100 x 1 today as non HD day.  May need additional agent.   Subjective:   Patient seen by vascular over weekendwith plans to follow-up in 6 weeks. Gross Hematuria since 5/21 improving He has no complaints this AM.    Objective:   BP (!) 180/95 (BP Location: Left Arm)   Pulse 72   Temp 98.1 F (36.7 C) (Oral)   Resp 20   Ht 5\' 3"  (1.6 m)   Wt 72.6 kg   SpO2 100%   BMI 28.34 kg/m   Intake/Output Summary (Last 24 hours) at  11/08/2019 0853 Last data filed at 11/08/2019 0326 Gross per 24 hour  Intake 460 ml  Output 626 ml  Net -166 ml   Weight change: 2.576 kg  Physical Exam: Gen: NAD, lying in bed CVS: no rub, normal rate Resp: CTA BL, no iwob Abd: +BS, soft, NT/ND Ext:1+ edema in BLE GU: scrotal edema improving, foley in Access: rt BBFgood bruit, RIJ TC  Imaging: No results found.  Labs: BMET Recent Labs  Lab 11/02/19 0210 11/03/19 0325 11/04/19 0434 11/05/19 0422 11/06/19 0618 11/07/19 0706 11/08/19 0607  NA 135 136 137 136 136 135 135  K 3.6 3.9 3.8 3.8 3.9 3.8 3.5  CL 99 99 99 99 97* 100 100  CO2 24 26 27 27 23 26 24   GLUCOSE 123* 115* 135* 118* 120* 121* 100*  BUN 57* 33* 22* 11 21* 15 22*  CREATININE 4.71* 3.47* 2.87* 2.28* 3.24* 2.60* 3.20*  CALCIUM 8.6* 8.5* 8.5* 8.3* 8.4* 8.1* 8.4*  PHOS 5.4* 4.6 3.3 2.5 3.8 3.1 3.8   CBC Recent Labs  Lab 11/04/19 1326 11/04/19 1326 11/05/19 1916 11/06/19 0712 11/07/19 0707 11/08/19 0607  WBC 7.9  --   --  9.4 10.4 10.9*  HGB 7.7*   < > 7.6* 6.8* 9.5* 9.3*  HCT 24.8*   < > 24.4* 22.0* 29.0* 29.1*  MCV 92.5  --   --  94.4 90.1 93.0  PLT 208  --   --  209 213 223   < > = values in this interval not displayed.    Medications:    . brimonidine  1 drop Left Eye BID  . calcium acetate  1,334 mg Oral TID WC  . carvedilol  12.5 mg Oral BID WC  . Chlorhexidine Gluconate Cloth  6 each Topical Q0600  . darbepoetin (ARANESP) injection - NON-DIALYSIS  100 mcg Subcutaneous Q Tue-1800  . dorzolamide-timolol  1 drop Left Eye BID  . gatifloxacin  1 drop Left Eye QID  . insulin aspart  0-9 Units Subcutaneous TID WC  . latanoprost  1 drop Left Eye QHS  . senna-docusate  1 tablet Oral BID    Jannifer Hick MD [facility] Pager [phone]

## 2019-11-08 NOTE — Progress Notes (Signed)
Physical Therapy Treatment Patient Details Name: Christopher Davila MRN: [id number] DOB: 1971-03-18 Today's Date: 11/08/2019    History of Present Illness Pt is an 49 y.o. male with PMH DM, HTN, CKD stage IIIb, peripheral neuropathy, diabetic retinopathy, bilateral vision loss, glaucoma, chronic diastolic heart failure who presented to ED with increased weakness.    PT Comments    Pt in bed upon arrival of PT/OT, agreeable to session with focus on progression of ambulation and activity. The pt was able to demonstrate significant improvements in mobility with decreased need for assist this session, progressing from needing assist of 2 to minG of 1 for safety. The pt was then able to complete multiple sit-stand transfers from the recliner with good use of BUE on the arm rests without cues, but reports significant increase in fatigue and dyspnea following activity. The pt continues to demo deficits in functional strength, stability, and endurance and will therefore continue to benefit from skilled PT to progress prior to d/c. The pt also continues to demo increased need for supplemental O2 as he was initially on 3.5L O2, was able to maintain SpO2 in low 90s on 2L through session, but was left on 3L after session to maintain SpO2 >95%.    Follow Up Recommendations  SNF;Supervision/Assistance - 24 hour     Equipment Recommendations  None recommended by PT    Recommendations for Other Services       Precautions / Restrictions Precautions Precautions: Fall Precaution Comments: blind; scrotal swelling Restrictions Weight Bearing Restrictions: No    Mobility  Bed Mobility Overal bed mobility: Needs Assistance Bed Mobility: Supine to Sit       Sit to supine: Supervision   General bed mobility comments: pt able to move to sitting EOB without assist, with supervision for line management  Transfers Overall transfer level: Needs assistance Equipment used: Rolling walker (2 wheeled) Transfers:  Sit to/from [facility] Sit to Stand: Min guard Stand pivot transfers: Min guard       General transfer comment: minA initially to stand from bed, progressed to minG with RW through session. VC for hand placement initially, pt able to progress to not needing VC. cues for direction due to pt blindness. pt with good safety with movement and pivot  Ambulation/Gait Ambulation/Gait assistance: Min guard Gait Distance (Feet): 5 Feet Assistive device: Rolling walker (2 wheeled) Gait Pattern/deviations: Step-through pattern;Wide base of support;Trunk flexed   Gait velocity interpretation: <1.8 ft/sec, indicate of risk for recurrent falls General Gait Details: pt able to take steps from bed to [facility] to recliner without assist, some VC for directioning due to pt blindness. pt with good, slow movement   Stairs             Wheelchair Mobility    Modified Rankin (Stroke Patients Only)       Balance Overall balance assessment: Needs assistance Sitting-balance support: Feet supported Sitting balance-Leahy Scale: Good Sitting balance - Comments: pt able to maintain without UE support, but does rest with UE support   Standing balance support: Bilateral upper extremity supported Standing balance-Leahy Scale: Poor Standing balance comment: reliant on external support                            Cognition Arousal/Alertness: Awake/alert Behavior During Therapy: Flat affect Overall Cognitive Status: Within Functional Limits for tasks assessed  General Comments: Pt able to follow commands with slightly increased time, but did not require additional cues. Pt with good, slow movements with attention to safety. generally flat affect but cooperative and agreeable through session      Exercises      General Comments General comments (skin integrity, edema, etc.): VSS on 3L O2. Pt initially on 3.5L, reduced to 2.5L  through session which pt maintained SpO2 90-92%, so increased to 3L prior to end of session, pt SpO2 95-97% on 3L      Pertinent Vitals/Pain Pain Assessment: No/denies pain Pain Intervention(s): Limited activity within patient's tolerance;Monitored during session;Repositioned    Home Living                      Prior Function            PT Goals (current goals can now be found in the care plan section) Acute Rehab PT Goals Patient Stated Goal: get better PT Goal Formulation: With patient Time For Goal Achievement: 10/28/19 Potential to Achieve Goals: Good Progress towards PT goals: Progressing toward goals    Frequency    Min 3X/week      PT Plan Current plan remains appropriate    Co-evaluation PT/OT/SLP Co-Evaluation/Treatment: Yes Reason for Co-Treatment: For patient/therapist safety;To address functional/ADL transfers PT goals addressed during session: Mobility/safety with mobility;Balance        AM-PAC PT "6 Clicks" Mobility   Outcome Measure  Help needed turning from your back to your side while in a flat bed without using bedrails?: A Little Help needed moving from lying on your back to sitting on the side of a flat bed without using bedrails?: A Little Help needed moving to and from a bed to a chair (including a wheelchair)?: A Little Help needed standing up from a chair using your arms (e.g., wheelchair or bedside chair)?: A Little Help needed to walk in hospital room?: A Little Help needed climbing 3-5 steps with a railing? : A Lot 6 Click Score: 17    End of Session Equipment Utilized During Treatment: Gait belt;Oxygen Activity Tolerance: Patient tolerated treatment well Patient left: in chair;with call bell/phone within reach;with chair alarm set Nurse Communication: Mobility status PT Visit Diagnosis: Unsteadiness on feet (R26.81);Muscle weakness (generalized) (M62.81);Difficulty in walking, not elsewhere classified (R26.2)     Time:  4888-9169 PT Time Calculation (min) (ACUTE ONLY): 33 min  Charges:  $Therapeutic Activity: 8-22 mins                     Karma Ganja, PT, DPT   Acute Rehabilitation Department Pager #: [phone]   Otho Bellows 11/08/2019, 3:50 PM

## 2019-11-08 NOTE — Progress Notes (Signed)
Patient ID: Christopher Davila, male   DOB: Feb 18, 1971, 49 y.o.   MRN: [id number]  I was in to see Christopher Davila today.  The urine in the foley appears clear though scant.  He has minimal bleeding around the foley.    Imp: urethral trauma from foley removal.  Rec: Leave foley for at least 24 hours beyond the time when he has no bloody drainage around the foley.

## 2019-11-09 LAB — RENAL FUNCTION PANEL
Albumin: 2.5 g/dL — ABNORMAL LOW (ref 3.5–5.0)
Anion gap: 11 (ref 5–15)
BUN: 31 mg/dL — ABNORMAL HIGH (ref 6–20)
CO2: 26 mmol/L (ref 22–32)
Calcium: 8.4 mg/dL — ABNORMAL LOW (ref 8.9–10.3)
Chloride: 98 mmol/L (ref 98–111)
Creatinine, Ser: 3.5 mg/dL — ABNORMAL HIGH (ref 0.61–1.24)
GFR calc Af Amer: 23 mL/min — ABNORMAL LOW (ref >60)
GFR calc non Af Amer: 20 mL/min — ABNORMAL LOW (ref >60)
Glucose, Bld: 117 mg/dL — ABNORMAL HIGH (ref 70–99)
Phosphorus: 4.4 mg/dL (ref 2.5–4.6)
Potassium: 3.4 mmol/L — ABNORMAL LOW (ref 3.5–5.1)
Sodium: 135 mmol/L (ref 135–145)

## 2019-11-09 LAB — GLUCOSE, CAPILLARY
Glucose-Capillary: 117 mg/dL — ABNORMAL HIGH (ref 70–99)
Glucose-Capillary: 143 mg/dL — ABNORMAL HIGH (ref 70–99)
Glucose-Capillary: 172 mg/dL — ABNORMAL HIGH (ref 70–99)
Glucose-Capillary: 189 mg/dL — ABNORMAL HIGH (ref 70–99)

## 2019-11-09 LAB — CBC
HCT: 28.3 % — ABNORMAL LOW (ref 39.0–52.0)
Hemoglobin: 9.2 g/dL — ABNORMAL LOW (ref 13.0–17.0)
MCH: 30.3 pg (ref 26.0–34.0)
MCHC: 32.5 g/dL (ref 30.0–36.0)
MCV: 93.1 fL (ref 80.0–100.0)
Platelets: 228 10*3/uL (ref 150–400)
RBC: 3.04 MIL/uL — ABNORMAL LOW (ref 4.22–5.81)
RDW: 18.2 % — ABNORMAL HIGH (ref 11.5–15.5)
WBC: 12 10*3/uL — ABNORMAL HIGH (ref 4.0–10.5)
nRBC: 0 % (ref 0.0–0.2)

## 2019-11-09 LAB — AMMONIA: Ammonia: 47 umol/L — ABNORMAL HIGH (ref 9–35)

## 2019-11-09 MED ORDER — LOSARTAN POTASSIUM 25 MG PO TABS
25.0000 mg | ORAL_TABLET | Freq: Every day | ORAL | Status: DC
  Administered 2019-11-09 – 2019-11-10 (×2): 25 mg via ORAL
  Filled 2019-11-09 (×2): qty 1

## 2019-11-09 NOTE — Progress Notes (Addendum)
Patient without insurance has now received financial clearance from [facility] to start in the OP HD clinic when medically ready for discharge.  He had been accepted to [facility] clinic on a TTS schedule with a seat time of 11:15am. He needs to arrive 20 minutes early to his appointments. On his first day at the clinic, he will need to arrive at 10:15am to complete intake paperwork prior to his first treatment. If his first treatment falls on a Saturday, patient needs to arrive the Friday before to complete intake paperwork.  Navigator updated Nephrologist, Attending and CSW.  Navigator will meet with patient to inform verbally and provide him with his schedule in writing to give to his cousin. Navigator will also call patient's cousin to update. Navigator will continue to follow for discharge planning to assist with smooth transition from hospital to OP HD clinic start.   Alphonzo Cruise, [place] Renal Navigator [phone]

## 2019-11-09 NOTE — Progress Notes (Signed)
PROGRESS NOTE    Christopher Davila  NTI:[id number] DOB: 04-19-1971 DOA: 10/13/2019 PCP: Marliss Coots, NP   Brief Narrative: 49 year old with past medical history significant for hypertension, diabetes, gastroparesis, blindness, CKD stage IV, chronic diastolic heart failure admitted with diarrhea, weakness and failure to thrive.  He was recently admitted on 4/10--12 for acute on chronic diastolic heart failure.    He presented on 4/28 and was found to have severe acidosis with a gap, received bicarb IV fluid and was admitted.  He was having diarrhea and has since then resolved.  C diff and GI pathogen was negative.  Patient subsequently developed progressive fluid overload with lower extremity edema and acute on chronic diastolic heart failure.  He was a started on Lasix drip and metolazone but remain fluid overloaded with oliguria and uremic symptoms.  Patient had TDC and is started hemodialysis on 10/28/2019.  Patient had right arm AV graft on 14.  Hemodialysis is switched from M,W,F to TTS due to census.  Patient is still volume overload/  Patient develops urethral bleed after foley catheter was removed. Patient hb drop to 6. He received 2 units PRBC. Received DDAVP. Foley catheter was placed again. Urology consulted. Might need cystoscopy if bleeding persist. After placement of foley catheter bleeding has decreased/    Assessment & Plan:   Principal Problem:   Diarrhea Active Problems:   Type 2 diabetes mellitus with hypoglycemia (HCC)   Essential hypertension   Hyperlipidemia   CKD (chronic kidney disease) stage 4, GFR 15-29 ml/min (HCC)   Weakness   Metabolic acidosis, increased anion gap   Acute urinary retention   ESRD on dialysis (HCC)   Heart failure with preserved ejection fraction (HCC)   Goals of care, counseling/discussion   Palliative care encounter   Pressure injury of skin  1-Acute Hypoxic Respiratory Failure due to Acute on Chronic Diastolic Heart Failure Exacerbation:  Volume overload: -Improved, but is still requiring hemodialysis for volume management -Continue with hemodialysis. - Negative 8 Liters -Clip process in progress.   Acute on chronic diastolic heart failure exacerbation, volume overload in the setting of renal failure Patient did not respond to IV diuresis Started hemodialysis Volume management with HD>  Abdomen distended. [place] negative for ascites.   AKI on CKD stage IV progressing to end-stage renal disease oliguria now new dialysis Right arm aVF on 5/14 need to follow-up with PPS in 6 weeks. TDC and started on hemodialysis on 5/13 HD TTS  Acute blood loss anemia; and anemia of chronic renal diseases. Urethral bleeding Hb down to 6. S/P Transfuse 2 units PRBC 5/22 Hb today at 9.  Appreciate Dr Roni Bread evaluation. Hold on placing foley.  Continue to have mild urethral bleeding. Plan is to continue to monitor. If doesn't resolved might need cystoscopy with fulguration.  Foley catheter placed 5/23. Having less bleeding. Hb stable.   Gap metabolic acidosis due to renal failure: Resolved.  Hyperphosphatemia: Improved  Acute metabolic encephalopathy: Related to uremia Still with confusion and hallucination at times. ammonia mildly elevated (38). Received a dose of lactulose. Ammonia decreased to 37/  Will resume low dose gabapentin.   Acute urinary retention: No significant urine out put. After removing catheter patient develops bleeding through urethra.   Hypothermia: Resolved.  Diabetes type 2: Continue with low-dose Lantus and sliding scale insulin  Hypertension; Elevated, started  low dose carvedilol.   Hyperlipidemia Continue with statins.  Diarrhea Resolved   Goals of care; Patient with significant comorbidity, likely plan with social barrier  now on dialysis.. Palliative was consulted  Estimated body mass index is 23.35 kg/m as calculated from the following:   Height as of this encounter: 5\' 3"  (1.6 m).   Weight as of  this encounter: 59.8 kg.   DVT prophylaxis: Heparin Code Status: Full code Family Communication: No family at bedside.  Disposition Plan:  Status is: Inpatient  Remains inpatient appropriate because ; clip in progress for HD, urethral bleeding    Dispo: The patient is from: Home              Anticipated d/c is to: SNF vs [facility]              Anticipated d/c date is: 2              Patient currently is not medically stable to d/c.  Requiring hemodialysis for volume management, urethral bleeding. Clip in progress for new HD    Consultants:   Nephrology  IR  PMT  Vascular  Procedures:   Hemodialysis  Antimicrobials:  None  Subjective: He denies pain, bleeding has decreased.   Objective: Vitals:   11/08/19 1900 11/08/19 2035 11/09/19 0413 11/09/19 1156  BP: (!) 145/85 (!) 182/96  (!) 165/96  Pulse: 94 76  72  Resp:  19  19  Temp: 99.4 F (37.4 C) 98.3 F (36.8 C)  98.1 F (36.7 C)  TempSrc: Oral Oral  Oral  SpO2: 100% 99%  95%  Weight:   59.8 kg   Height:        Intake/Output Summary (Last 24 hours) at 11/09/2019 1249 Last data filed at 11/09/2019 0900 Gross per 24 hour  Intake 590 ml  Output 240 ml  Net 350 ml   Filed Weights   11/07/19 0539 11/08/19 0326 11/09/19 0413  Weight: 70.8 kg 72.6 kg 59.8 kg    Examination:  General exam: NAD, Legally blind Respiratory system: CTA Cardiovascular system: S 1, S 2  RRR Gastrointestinal system: BS present, soft, nt GU; foley in place, less bleeding form urethra Central nervous system; Alert.  Extremities; trace edema   Data Reviewed: I have personally reviewed following labs and imaging studies  CBC: Recent Labs  Lab 11/04/19 1326 11/04/19 1326 11/05/19 1916 11/06/19 0712 11/07/19 0707 11/08/19 0607 11/09/19 0454  WBC 7.9  --   --  9.4 10.4 10.9* 12.0*  HGB 7.7*   < > 7.6* 6.8* 9.5* 9.3* 9.2*  HCT 24.8*   < > 24.4* 22.0* 29.0* 29.1* 28.3*  MCV 92.5  --   --  94.4 90.1 93.0 93.1  PLT 208  --    --  209 213 223 228   < > = values in this interval not displayed.   Basic Metabolic Panel: Recent Labs  Lab 11/05/19 0422 11/06/19 0618 11/07/19 0706 11/08/19 0607 11/09/19 0454  NA 136 136 135 135 135  K 3.8 3.9 3.8 3.5 3.4*  CL 99 97* 100 100 98  CO2 27 23 26 24 26   GLUCOSE 118* 120* 121* 100* 117*  BUN 11 21* 15 22* 31*  CREATININE 2.28* 3.24* 2.60* 3.20* 3.50*  CALCIUM 8.3* 8.4* 8.1* 8.4* 8.4*  PHOS 2.5 3.8 3.1 3.8 4.4   GFR: Estimated Creatinine Clearance: 21 mL/min (A) (by C-G formula based on SCr of 3.5 mg/dL (H)). Liver Function Tests: Recent Labs  Lab 11/05/19 0422 11/06/19 0618 11/07/19 0706 11/08/19 0607 11/09/19 0454  ALBUMIN 2.4* 2.4* 2.4* 2.5* 2.5*   No results for input(s): LIPASE, AMYLASE in  the last 168 hours. Recent Labs  Lab 11/03/19 0913 11/05/19 0422  AMMONIA 38* 37*   Coagulation Profile: No results for input(s): INR, PROTIME in the last 168 hours. Cardiac Enzymes: No results for input(s): CKTOTAL, CKMB, CKMBINDEX, TROPONINI in the last 168 hours. BNP (last 3 results) No results for input(s): PROBNP in the last 8760 hours. HbA1C: No results for input(s): HGBA1C in the last 72 hours. CBG: Recent Labs  Lab 11/08/19 1102 11/08/19 1624 11/08/19 2156 11/09/19 0653 11/09/19 1157  GLUCAP 104* 155* 198* 117* 143*   Lipid Profile: No results for input(s): CHOL, HDL, LDLCALC, TRIG, CHOLHDL, LDLDIRECT in the last 72 hours. Thyroid Function Tests: No results for input(s): TSH, T4TOTAL, FREET4, T3FREE, THYROIDAB in the last 72 hours. Anemia Panel: No results for input(s): VITAMINB12, FOLATE, FERRITIN, TIBC, IRON, RETICCTPCT in the last 72 hours. Sepsis Labs: No results for input(s): PROCALCITON, LATICACIDVEN in the last 168 hours.  No results found for this or any previous visit (from the past 240 hour(s)).       Radiology Studies: No results found.      Scheduled Meds: . brimonidine  1 drop Left Eye BID  . calcium acetate   1,334 mg Oral TID WC  . carvedilol  12.5 mg Oral BID WC  . Chlorhexidine Gluconate Cloth  6 each Topical Q0600  . darbepoetin (ARANESP) injection - NON-DIALYSIS  100 mcg Subcutaneous Q Tue-1800  . dorzolamide-timolol  1 drop Left Eye BID  . gabapentin  100 mg Oral Daily  . gatifloxacin  1 drop Left Eye QID  . insulin aspart  0-9 Units Subcutaneous TID WC  . latanoprost  1 drop Left Eye QHS  . losartan  25 mg Oral Daily  . senna-docusate  1 tablet Oral BID  . torsemide  100 mg Oral Daily   Continuous Infusions: . sodium chloride 250 mL (11/06/19 1453)     LOS: 25 days    Time spent: 35 minutes    Christopher Davila A Kewan Mcnease, MD [facility]   If 7PM-7AM, please contact night-coverage [email or web address]  11/09/2019, 12:49 PM

## 2019-11-09 NOTE — Progress Notes (Signed)
Physical Therapy Treatment Patient Details Name: Christopher Davila MRN: [id number] DOB: 10-21-1970 Today's Date: 11/09/2019    History of Present Illness Pt is an 49 y.o. male with PMH DM, HTN, CKD stage IIIb, peripheral neuropathy, diabetic retinopathy, bilateral vision loss, glaucoma, chronic diastolic heart failure who presented to ED with increased weakness.    PT Comments    Pt motivated to participate in PT, states he wants to go to rehab after having discussion with cousin. Pt required light assist for bed mobility and transfer to recliner, pt still with limited activity tolerance for gait. Pt tolerated LE exercises for LE strengthening well, up in recliner upon PT exit. PT continuing to recommend SNF level of care post-acutely, will continue to follow.    Follow Up Recommendations  SNF;Supervision/Assistance - 24 hour     Equipment Recommendations  None recommended by PT    Recommendations for Other Services       Precautions / Restrictions Precautions Precautions: Fall Precaution Comments: blind; scrotal swelling Restrictions Weight Bearing Restrictions: No    Mobility  Bed Mobility Overal bed mobility: Needs Assistance Bed Mobility: Supine to Sit     Supine to sit: Min guard     General bed mobility comments: min guard for safety, verbal and tactile cuing for use of bedrails as needed.  Transfers Overall transfer level: Needs assistance Equipment used: Rolling walker (2 wheeled) Transfers: Sit to/from Stand Sit to Stand: Min guard;From elevated surface         General transfer comment: min guard for safety, verbal/tactile cuing for hand placement on RW and bed when rising. Increased time to rise and steady.  Ambulation/Gait Ambulation/Gait assistance: Min assist Gait Distance (Feet): 3 Feet Assistive device: Rolling walker (2 wheeled) Gait Pattern/deviations: Step-through pattern;Wide base of support;Trunk flexed;Shuffle Gait velocity: decr   General  Gait Details: min assist to steady, guide pt/RW. Verbal cuing for maneuvering RW, pt limited in distance by fatigue.   Stairs             Wheelchair Mobility    Modified Rankin (Stroke Patients Only)       Balance Overall balance assessment: Needs assistance Sitting-balance support: Feet supported Sitting balance-Leahy Scale: Fair     Standing balance support: Bilateral upper extremity supported Standing balance-Leahy Scale: Poor Standing balance comment: reliant on external support                            Cognition Arousal/Alertness: Awake/alert Behavior During Therapy: Flat affect Overall Cognitive Status: Within Functional Limits for tasks assessed                               Problem Solving: Slow processing General Comments: increased processing time to respond to questions      Exercises General Exercises - Lower Extremity Ankle Circles/Pumps: AROM;Both;15 reps;Seated Long Arc Quad: AROM;Both;15 reps;Seated(x10 with min PT manual resistance) Hip ABduction/ADduction: AAROM;Both;15 reps;Seated Hip Flexion/Marching: AROM;Both;15 reps;Seated;Standing    General Comments General comments (skin integrity, edema, etc.): HR in 70s during mobility      Pertinent Vitals/Pain Pain Assessment: No/denies pain Pain Intervention(s): Limited activity within patient's tolerance;Monitored during session    Home Living                      Prior Function            PT Goals (current goals can now  be found in the care plan section) Acute Rehab PT Goals Patient Stated Goal: get better PT Goal Formulation: With patient Time For Goal Achievement: 10/28/19 Potential to Achieve Goals: Good Progress towards PT goals: Progressing toward goals    Frequency    Min 2X/week      PT Plan Current plan remains appropriate    Co-evaluation              AM-PAC PT "6 Clicks" Mobility   Outcome Measure  Help needed turning  from your back to your side while in a flat bed without using bedrails?: A Little Help needed moving from lying on your back to sitting on the side of a flat bed without using bedrails?: A Little Help needed moving to and from a bed to a chair (including a wheelchair)?: A Little Help needed standing up from a chair using your arms (e.g., wheelchair or bedside chair)?: A Little Help needed to walk in hospital room?: A Little Help needed climbing 3-5 steps with a railing? : A Lot 6 Click Score: 17    End of Session Equipment Utilized During Treatment: Gait belt;Oxygen Activity Tolerance: Patient tolerated treatment well Patient left: in chair;with call bell/phone within reach;with chair alarm set Nurse Communication: Mobility status PT Visit Diagnosis: Unsteadiness on feet (R26.81);Muscle weakness (generalized) (M62.81);Difficulty in walking, not elsewhere classified (R26.2)     Time: 3734-2876 PT Time Calculation (min) (ACUTE ONLY): 13 min  Charges:  $Therapeutic Activity: 8-22 mins                     Shunta Mclaurin E, PT Acute Rehabilitation Services Pager [phone]  Office [phone]    Jarome Trull D Elonda Husky 11/09/2019, 5:35 PM

## 2019-11-09 NOTE — Progress Notes (Signed)
Assisted pt with dinner.  Pt assisted up to bedside commode. Pericare completed.  Pt back to bedside chair.  Call bell and telephone within reach.

## 2019-11-09 NOTE — Progress Notes (Signed)
Renal Navigator attempted to meet with patient on two separate occasions at bedside today. He did not respond to Navigator knocking on his door or calling his name. Navigator knelt down near patient and asked for him to awaken on second visit because we needed to discuss OP HD information. He would not rouse. Navigator left written information regarding patient's OP HD clinic seat schedule and left. Navigator updated patient's bedside RN.  Navigator called patient's cousin/Sandra to provide OP HD clinic schedule. [name] was very Patent attorney. Navigator provide clinic information over the phone, as well as phone number to arrange [facility] [place] rides and told [name] that paperwork regarding these things has been left for her at patient's bedside. Navigator asked if she has been able to visit patient. Patient's cousin states that she has not been able to visit recently because she has been working both jobs. Navigator states understanding in her need to work, and also stated importance of keeping involved in patient's care as it has been noted that the plan is discharge to her home when medically ready. She states she will try to visit if able. Cousin sounds very supportive and caring.  Navigator will continue to follow for discharge planning.   Alphonzo Cruise, [place] Renal Navigator [phone]

## 2019-11-09 NOTE — Progress Notes (Signed)
[facility] Progress Note   New start.  Assessment/ Plan:   AKI/CKD stage IV- now progressed to ESRD in setting of acute on chronic diastolic CHF and diuresis.  -Dialysis Access:RIJTDC on 5/13.1st stage BBF placed5/14/2021. Plan to follow-up with vascular in 6 weeks.   Last  HD Sat (5/22); plan next HD today - CLIP processing -> waiting for response from [facility]  2. Acute on chronic diastolic CHF-didnot respond to high dose diuretics- Uf with dialysis; approaching euvolemia. 3. Urinary retention- s/p foley catheter and removal with less UOP -> hematuria after cath removed (incidental?). Hematuria/ blood clots slowing down and Dr. Jeffie Pollock saw yesterday rec keep foley x24h after bloody discharge stops.  Would like to remove foley for TOV prior to discharge.   4. Anemia of CKD stage IV- hgbdropped to 6.8 and received a unit of PRBC's at that time and has risen to 9's.Received 2 doses IV feraheme on 5/1 and Aranesp 40 mcg on 4/30. ESA increased to 127mcg on 5/10 and 5/18. 5/25/ Hb 9.2, CTM.  5. DM type 2 with hyperglycemia - per primary svc 6. Bones-phosphorus is improving with dialysis. Continue PhosLo at current dose. - PTH 172; phos 4.4 (5/25) 7. Hypertension:  On coreg 12.5 BID, still HTN; gentle UF today with HD but add losartan 25 daily.   Subjective:    He has no complaints this AM.  Expresses desire to go home instead of to rehab/SNF.     Objective:   BP (!) 182/96 (BP Location: Left Arm)   Pulse 76   Temp 98.3 F (36.8 C) (Oral)   Resp 19   Ht 5\' 3"  (1.6 m)   Wt 59.8 kg   SpO2 99%   BMI 23.35 kg/m   Intake/Output Summary (Last 24 hours) at 11/09/2019 0932 Last data filed at 11/08/2019 2115 Gross per 24 hour  Intake 720 ml  Output 240 ml  Net 480 ml   Weight change: -12.8 kg  Physical Exam: Gen: NAD, lying in bed CVS: no rub, normal rate Resp: CTA BL, no iwob Abd: +BS, soft, NT/ND ZOX:WRUEA edema GU: scrotal edema improving, foley  in Access: rt BBFgood bruit, RIJ TC  Imaging: No results found.  Labs: BMET Recent Labs  Lab 11/03/19 0325 11/04/19 0434 11/05/19 0422 11/06/19 0618 11/07/19 0706 11/08/19 0607 11/09/19 0454  NA 136 137 136 136 135 135 135  K 3.9 3.8 3.8 3.9 3.8 3.5 3.4*  CL 99 99 99 97* 100 100 98  CO2 26 27 27 23 26 24 26   GLUCOSE 115* 135* 118* 120* 121* 100* 117*  BUN 33* 22* 11 21* 15 22* 31*  CREATININE 3.47* 2.87* 2.28* 3.24* 2.60* 3.20* 3.50*  CALCIUM 8.5* 8.5* 8.3* 8.4* 8.1* 8.4* 8.4*  PHOS 4.6 3.3 2.5 3.8 3.1 3.8 4.4   CBC Recent Labs  Lab 11/06/19 0712 11/07/19 0707 11/08/19 0607 11/09/19 0454  WBC 9.4 10.4 10.9* 12.0*  HGB 6.8* 9.5* 9.3* 9.2*  HCT 22.0* 29.0* 29.1* 28.3*  MCV 94.4 90.1 93.0 93.1  PLT 209 213 223 228    Medications:    . brimonidine  1 drop Left Eye BID  . calcium acetate  1,334 mg Oral TID WC  . carvedilol  12.5 mg Oral BID WC  . Chlorhexidine Gluconate Cloth  6 each Topical Q0600  . darbepoetin (ARANESP) injection - NON-DIALYSIS  100 mcg Subcutaneous Q Tue-1800  . dorzolamide-timolol  1 drop Left Eye BID  . gabapentin  100 mg Oral Daily  .  gatifloxacin  1 drop Left Eye QID  . insulin aspart  0-9 Units Subcutaneous TID WC  . latanoprost  1 drop Left Eye QHS  . senna-docusate  1 tablet Oral BID  . torsemide  100 mg Oral Daily    Jannifer Hick MD [facility] Pager [phone]

## 2019-11-10 DIAGNOSIS — R198 Other specified symptoms and signs involving the digestive system and abdomen: Secondary | ICD-10-CM

## 2019-11-10 LAB — RENAL FUNCTION PANEL
Albumin: 2.4 g/dL — ABNORMAL LOW (ref 3.5–5.0)
Anion gap: 14 (ref 5–15)
BUN: 39 mg/dL — ABNORMAL HIGH (ref 6–20)
CO2: 25 mmol/L (ref 22–32)
Calcium: 8.6 mg/dL — ABNORMAL LOW (ref 8.9–10.3)
Chloride: 95 mmol/L — ABNORMAL LOW (ref 98–111)
Creatinine, Ser: 3.8 mg/dL — ABNORMAL HIGH (ref 0.61–1.24)
GFR calc Af Amer: 21 mL/min — ABNORMAL LOW (ref >60)
GFR calc non Af Amer: 18 mL/min — ABNORMAL LOW (ref >60)
Glucose, Bld: 192 mg/dL — ABNORMAL HIGH (ref 70–99)
Phosphorus: 4.8 mg/dL — ABNORMAL HIGH (ref 2.5–4.6)
Potassium: 3.1 mmol/L — ABNORMAL LOW (ref 3.5–5.1)
Sodium: 134 mmol/L — ABNORMAL LOW (ref 135–145)

## 2019-11-10 LAB — GLUCOSE, CAPILLARY
Glucose-Capillary: 198 mg/dL — ABNORMAL HIGH (ref 70–99)
Glucose-Capillary: 180 mg/dL — ABNORMAL HIGH (ref 70–99)
Glucose-Capillary: 130 mg/dL — ABNORMAL HIGH (ref 70–99)
Glucose-Capillary: 142 mg/dL — ABNORMAL HIGH (ref 70–99)
Glucose-Capillary: 170 mg/dL — ABNORMAL HIGH (ref 70–99)

## 2019-11-10 MED ORDER — POTASSIUM CHLORIDE CRYS ER 20 MEQ PO TBCR
40.0000 meq | EXTENDED_RELEASE_TABLET | Freq: Once | ORAL | Status: AC
  Administered 2019-11-10: 40 meq via ORAL
  Filled 2019-11-10: qty 2

## 2019-11-10 MED ORDER — HYDRALAZINE HCL 20 MG/ML IJ SOLN
5.0000 mg | Freq: Four times a day (QID) | INTRAMUSCULAR | Status: DC | PRN
  Administered 2019-11-10 – 2019-11-12 (×2): 5 mg via INTRAVENOUS
  Filled 2019-11-10 (×2): qty 1

## 2019-11-10 MED ORDER — FUROSEMIDE 10 MG/ML IJ SOLN
120.0000 mg | Freq: Once | INTRAVENOUS | Status: DC
  Filled 2019-11-10: qty 12

## 2019-11-10 MED ORDER — FUROSEMIDE 10 MG/ML IJ SOLN
120.0000 mg | Freq: Three times a day (TID) | INTRAVENOUS | Status: AC
  Administered 2019-11-10 (×2): 120 mg via INTRAVENOUS
  Filled 2019-11-10: qty 10
  Filled 2019-11-10: qty 12

## 2019-11-10 NOTE — Progress Notes (Signed)
Occupational Therapy Treatment Patient Details Name: Christopher Davila MRN: [id number] DOB: 1970-09-15 Today's Date: 11/10/2019    History of present illness Pt is an 49 y.o. male with PMH DM, HTN, CKD stage IIIb, peripheral neuropathy, diabetic retinopathy, bilateral vision loss, glaucoma, chronic diastolic heart failure who presented to ED with increased weakness.   OT comments  Pt not making progress towards OT goals this session, limited by lethargy, high BP, and fatigue. Pt mod A for bedlevel ADL, elevated scrotum with wash clothes and pillowcase for skin integrity. BP 187/97 - RN in room, assisted with getting patient comfortable and ended session - RN, NT, and Pt did not need anything else from therapy team. OT will continue to follow acutely, changed dc recommendation to SNF.   Follow Up Recommendations  SNF    Equipment Recommendations  3 in 1 bedside commode    Recommendations for Other Services Other (comment)(Palliative)    Precautions / Restrictions Precautions Precautions: Fall Precaution Comments: blind; scrotal swelling Required Braces or Orthoses: Other Brace Other Brace: scrotal sling hanging in closet Restrictions Weight Bearing Restrictions: No       Mobility Bed Mobility Overal bed mobility: Needs Assistance Bed Mobility: Rolling Rolling: Mod assist         General bed mobility comments: multimodal cues in conjunction with mod physical assist  Transfers                 General transfer comment: NT today due to BP    Balance                                           ADL either performed or assessed with clinical judgement   ADL Overall ADL's : Needs assistance/impaired     Grooming: Minimal assistance;Bed level;Wash/dry face Grooming Details (indicate cue type and reason): bed level due to arousal today             Lower Body Dressing: Maximal assistance;Sitting/lateral leans Lower Body Dressing Details (indicate  cue type and reason): to support swollen scrotum               General ADL Comments: limited by high BP today     Vision   Vision Assessment?: Vision impaired- to be further tested in functional context Additional Comments: blind   Perception     Praxis      Cognition Arousal/Alertness: Lethargic Behavior During Therapy: Flat affect Overall Cognitive Status: Impaired/Different from baseline Area of Impairment: Attention;Following commands;Awareness;Problem solving                   Current Attention Level: Selective   Following Commands: Follows one step commands inconsistently;Follows one step commands with increased time   Awareness: Intellectual Problem Solving: Slow processing;Decreased initiation General Comments: very lethargic        Exercises     Shoulder Instructions       General Comments BP high, washcloths with pillowcase elevated scrotum    Pertinent Vitals/ Pain       Pain Assessment: No/denies pain Pain Intervention(s): Limited activity within patient's tolerance;Monitored during session  Home Living                                          Prior Functioning/Environment  Frequency  Min 2X/week        Progress Toward Goals  OT Goals(current goals can now be found in the care plan section)  Progress towards OT goals: Not progressing toward goals - comment(decreased cognition and medical status)  Acute Rehab OT Goals Patient Stated Goal: none stated Time For Goal Achievement: 11/22/19 Potential to Achieve Goals: Good  Plan Discharge plan needs to be updated    Co-evaluation                 AM-PAC OT "6 Clicks" Daily Activity     Outcome Measure   Help from another person eating meals?: A Lot Help from another person taking care of personal grooming?: A Lot Help from another person toileting, which includes using toliet, bedpan, or urinal?: Total Help from another person bathing  (including washing, rinsing, drying)?: A Lot Help from another person to put on and taking off regular upper body clothing?: A Lot Help from another person to put on and taking off regular lower body clothing?: Total 6 Click Score: 10    End of Session Equipment Utilized During Treatment: Oxygen(3L)  OT Visit Diagnosis: Repeated falls (R29.6);Other symptoms and signs involving cognitive function Pain - part of body: (scrotum and penis)   Activity Tolerance Treatment limited secondary to medical complications (Comment)(BP)   Patient Left in bed;with nursing/sitter in room   Nurse Communication Other (comment)(in room, addressing BP)        Time: 0086-7619 OT Time Calculation (min): 15 min  Charges: OT General Charges $OT Visit: 1 Visit OT Treatments $Self Care/Home Management : 8-22 mins  Jesse Sans OTR/L Acute Rehabilitation Services Pager: [phone] Office: [place] 11/10/2019, 1:50 PM

## 2019-11-10 NOTE — Progress Notes (Signed)
[facility] Progress Note   New start.  Assessment/ Plan:   AKI/CKD stage IV- now progressed to ESRD in setting of acute on chronic diastolic CHF and diuresis.  -Dialysis Access:RIJTDC on 5/13.1st stage BBF placed5/14/2021. Plan to follow-up with vascular in 6 weeks.   - Tolerated HD yesterday fine, next tomorrow, 4hr, 4K/2.5Ca, 1L UF, no heparin - CLIPped to [facility] TTS, making arrangements for transportation and home assistance.  2. Acute on chronic diastolic CHF-vol OL today - give lasix 120 IV x 2 today, UF with HD tomorrow. 3. Urinary retention- s/p foley catheter and removal with less UOP -> hematuria after cath removed (incidental?). Hematuria/ blood clots slowing down and Dr. Jeffie Pollock saw this week rec keep foley x24h after bloody discharge stops.  Would like to remove foley for TOV prior to discharge.   4. Anemia of CKD stage IV- hgbdropped to 6.8 and received a unit of PRBC's at that time and has risen to 9's.Received 2 doses IV feraheme on 5/1 and Aranesp 40 mcg on 4/30. ESA increased to 115mcg on 5/10 and 5/18. 5/25/ Hb 9.2, CTM.  5. DM type 2 with hyperglycemia - per primary svc 6. Bones-phosphorus is improving with dialysis. Continue PhosLo at current dose. - PTH 172; phos 4.4 (5/25) 7. Hypertension:  On coreg 12.5 BID, still HTN; Added losartan 25 5/26. 8. Hypokalemia : repleted by primary today; use 4K tomorrow for HD  Subjective:    Feeling more dyspnea this AM - requests I replace O2 Marissa.   Again expresses desire to go home instead of to rehab/SNF.     Objective:   BP (!) 185/101 (BP Location: Left Arm)   Pulse 75   Temp 98.1 F (36.7 C) (Oral)   Resp 20   Ht 5\' 3"  (1.6 m)   Wt 69.2 kg Comment: scale a  SpO2 99%   BMI 27.03 kg/m   Intake/Output Summary (Last 24 hours) at 11/10/2019 1025 Last data filed at 11/10/2019 0405 Gross per 24 hour  Intake 950 ml  Output 1076 ml  Net -126 ml   Weight change: 9.435 kg  Physical  Exam: Gen: NAD, lying in bed CVS: no rub, normal rate Resp: CTA BL, sl ^ WOB with conversation Abd: +BS, soft, NT/ND Ext:2+ pitting tibial edema GU: scrotal edema improving, foley in Access: rt BBFgood bruit, RIJ TC  Imaging: No results found.  Labs: BMET Recent Labs  Lab 11/04/19 0434 11/05/19 0422 11/06/19 0618 11/07/19 0706 11/08/19 0607 11/09/19 0454 11/10/19 0550  NA 137 136 136 135 135 135 134*  K 3.8 3.8 3.9 3.8 3.5 3.4* 3.1*  CL 99 99 97* 100 100 98 95*  CO2 27 27 23 26 24 26 25   GLUCOSE 135* 118* 120* 121* 100* 117* 192*  BUN 22* 11 21* 15 22* 31* 39*  CREATININE 2.87* 2.28* 3.24* 2.60* 3.20* 3.50* 3.80*  CALCIUM 8.5* 8.3* 8.4* 8.1* 8.4* 8.4* 8.6*  PHOS 3.3 2.5 3.8 3.1 3.8 4.4 4.8*   CBC Recent Labs  Lab 11/06/19 0712 11/07/19 0707 11/08/19 0607 11/09/19 0454  WBC 9.4 10.4 10.9* 12.0*  HGB 6.8* 9.5* 9.3* 9.2*  HCT 22.0* 29.0* 29.1* 28.3*  MCV 94.4 90.1 93.0 93.1  PLT 209 213 223 228    Medications:    . brimonidine  1 drop Left Eye BID  . calcium acetate  1,334 mg Oral TID WC  . carvedilol  12.5 mg Oral BID WC  . Chlorhexidine Gluconate Cloth  6 each Topical  [id number]  . darbepoetin (ARANESP) injection - NON-DIALYSIS  100 mcg Subcutaneous Q Tue-1800  . dorzolamide-timolol  1 drop Left Eye BID  . gabapentin  100 mg Oral Daily  . gatifloxacin  1 drop Left Eye QID  . insulin aspart  0-9 Units Subcutaneous TID WC  . latanoprost  1 drop Left Eye QHS  . losartan  25 mg Oral Daily  . potassium chloride  40 mEq Oral Once  . senna-docusate  1 tablet Oral BID  . torsemide  100 mg Oral Daily    Jannifer Hick MD [facility] Pager [phone]

## 2019-11-10 NOTE — TOC Progression Note (Signed)
Transition of Care [facility] - Progression Note    Patient Details  Name: Christopher Davila MRN: [id number] Date of Birth: 1970-08-30  Transition of Care [facility]) CM/SW Contact  Zenon Mayo, RN Phone Number: 11/10/2019, 3:41 PM  Clinical Narrative:    Patient with ESRD now, he has been clipped for OP HD. He has developed urethral bleed and hgb dropped to 6, urology consulted, he conts to be in fluid overload with intermittent hematuria requiring foley.  TOC will cont to follow for dc needs. He is set up with [facility] for [facility] for charity, will continue with this, he does not have insurance at this time for SNF.  Since he is ESRD he should be able to get Medicare but this takes about 90 days or so.        Expected Discharge Plan and Services                                                 Social Determinants of Health (SDOH) Interventions    Readmission Risk Interventions Readmission Risk Prevention Plan 02/19/2019  Transportation Screening Complete  PCP or Specialist Appt within 5-7 Days Complete  Home Care Screening Complete  Medication Review (RN CM) Complete  Some recent data might be hidden

## 2019-11-10 NOTE — Plan of Care (Signed)
  Problem: Activity: Goal: Capacity to carry out activities will improve Outcome: Progressing   Problem: Coping: Goal: Level of anxiety will decrease Outcome: Progressing   Problem: Safety: Goal: Ability to remain free from injury will improve Outcome: Progressing   

## 2019-11-10 NOTE — Progress Notes (Signed)
PROGRESS NOTE    Christopher Davila  JGG:[id number] DOB: [birth year] DOA: 10/13/2019 PCP: Marliss Coots, NP   Brief Narrative: 49 year old with past medical history significant for hypertension, diabetes, gastroparesis, blindness, CKD stage IV, chronic diastolic heart failure admitted with diarrhea, weakness and failure to thrive.  He was recently admitted on 4/10--12 for acute on chronic diastolic heart failure.   He presented on 4/28 and was found to have severe metab acidosis, received bicarb IV fluid and was admitted.  He was having diarrhea and has since then resolved.  C diff and GI pathogen was negative.  Patient subsequently developed progressive fluid overload with lower extremity edema and acute on chronic diastolic heart failure.  He was a started on Lasix drip and metolazone but remain fluid overloaded with oliguria and uremic symptoms.  Patient had TDC and started hemodialysis on 10/28/2019.  Patient had right arm AV graft on 14.  Hemodialysis is switched from M,W,F to TTS due to census.  Patient is still volume overload. Patient develops urethral bleed after foley catheter was removed. Patient hb drop to 6. He received 2 units PRBC. Received DDAVP. Foley catheter was placed again. Urology consulted. Might need cystoscopy if bleeding persist. After placement of foley catheter bleeding has decreased   Assessment & Plan:   1-Acute Hypoxic Respiratory Failure due to Acute on Chronic Diastolic Heart Failure Exacerbation: Volume overload: -Improving,  still remains fluid overloaded with 2+ lower extremity edema and abdominal wall edema  -continue hemodialysis per renal  Acute on chronic diastolic heart failure exacerbation, volume overload in the setting of renal failure -Had poor response to diuretics, developed oliguria, uremia Started hemodialysis Volume management with HD>  Still has abdominal wall edema and 2+ lower extremity edema  AKI on CKD stage IV progressing to end-stage renal  disease oliguria now new dialysis Right arm aVF on 5/14 need to follow-up with [facility] in 6 weeks. TDC and started on hemodialysis on 5/13 HD TTS  Acute blood loss anemia; and anemia of chronic renal diseases.  Urethral bleeding Hb down to 6. S/P Transfused 2 units PRBC 5/22 Hb around 9 yesterday Appreciate Dr Ralene Muskrat input -Hematuria was secondary to Foley trauma after catheter removal, Foley catheter was replaced, urine is starting to clear but did have some hematuria last night per staff -Will leave catheter in for another day -Monitor hemoglobin, hopefully remove Foley catheter tomorrow if no further hematuria  Anion gap metabolic acidosis due to renal failure:  -Resolved.  Hyperphosphatemia: Improved  Acute metabolic encephalopathy:  -Secondary to uremia Still with confusion and hallucination at times. ammonia mildly elevated (38). Received a dose of lactulose. Ammonia decreased to 37/  Will resume low dose gabapentin.   Hypothermia: Resolved.  Diabetes type 2: Continue with low-dose Lantus and sliding scale insulin  Hypertension; Elevated, started  low dose carvedilol.   Hyperlipidemia Continue with statins.  Diarrhea Resolved  0Estimated body mass index is 27.03 kg/m as calculated from the following:   Height as of this encounter: 5\' 3"  (1.6 m).   Weight as of this encounter: 69.2 kg.   DVT prophylaxis: Heparin Code Status: Full code Family Communication: No family at bedside.  Disposition Plan:  Status is: Inpatient  Remains inpatient appropriate because ; clip in progress for HD, urethral bleeding    Dispo: The patient is from: Home              Anticipated d/c is to: SNF  Anticipated d/c date is: ? 48 hours              Patient currently is not medically stable to d/c. still fluid overloaded, intermittent hematuria requiring foley    Consultants:   Nephrology  IR  PMT  Vascular  Procedures:   Hemodialysis  Antimicrobials:    None  Subjective: -Complains of abdominal wall tightness and swelling, no nausea or vomiting  Objective: Vitals:   11/09/19 1156 11/09/19 1632 11/09/19 2048 11/10/19 0353  BP: (!) 165/96 (!) 183/94 (!) 187/97 (!) 185/101  Pulse: 72 75 74 75  Resp: 19 20 20 20   Temp: 98.1 F (36.7 C) 97.8 F (36.6 C) 98.3 F (36.8 C) 98.1 F (36.7 C)  TempSrc: Oral Oral Oral Oral  SpO2: 95% 100% 98% 99%  Weight:    69.2 kg  Height:        Intake/Output Summary (Last 24 hours) at 11/10/2019 1031 Last data filed at 11/10/2019 0900 Gross per 24 hour  Intake 1190 ml  Output 1276 ml  Net -86 ml   Filed Weights   11/08/19 0326 11/09/19 0413 11/10/19 0353  Weight: 72.6 kg 59.8 kg 69.2 kg    Examination:  General exam: Chronically ill blind pleasant male sitting up in bed Respiratory system: CTA Cardiovascular system: S 1, S 2  RRR Gastrointestinal system: BS present, soft, nt GU; foley in place, less bleeding form urethra Central nervous system; Alert.  Extremities; trace edema   Data Reviewed: I have personally reviewed following labs and imaging studies  CBC: Recent Labs  Lab 11/04/19 1326 11/04/19 1326 11/05/19 1916 11/06/19 0712 11/07/19 0707 11/08/19 0607 11/09/19 0454  WBC 7.9  --   --  9.4 10.4 10.9* 12.0*  HGB 7.7*   < > 7.6* 6.8* 9.5* 9.3* 9.2*  HCT 24.8*   < > 24.4* 22.0* 29.0* 29.1* 28.3*  MCV 92.5  --   --  94.4 90.1 93.0 93.1  PLT 208  --   --  209 213 223 228   < > = values in this interval not displayed.   Basic Metabolic Panel: Recent Labs  Lab 11/06/19 0618 11/07/19 0706 11/08/19 0607 11/09/19 0454 11/10/19 0550  NA 136 135 135 135 134*  K 3.9 3.8 3.5 3.4* 3.1*  CL 97* 100 100 98 95*  CO2 23 26 24 26 25   GLUCOSE 120* 121* 100* 117* 192*  BUN 21* 15 22* 31* 39*  CREATININE 3.24* 2.60* 3.20* 3.50* 3.80*  CALCIUM 8.4* 8.1* 8.4* 8.4* 8.6*  PHOS 3.8 3.1 3.8 4.4 4.8*   GFR: Estimated Creatinine Clearance: 21 mL/min (A) (by C-G formula based on SCr  of 3.8 mg/dL (H)). Liver Function Tests: Recent Labs  Lab 11/06/19 0618 11/07/19 0706 11/08/19 0607 11/09/19 0454 11/10/19 0550  ALBUMIN 2.4* 2.4* 2.5* 2.5* 2.4*   No results for input(s): LIPASE, AMYLASE in the last 168 hours. Recent Labs  Lab 11/05/19 0422 11/09/19 1537  AMMONIA 37* 47*   Coagulation Profile: No results for input(s): INR, PROTIME in the last 168 hours. Cardiac Enzymes: No results for input(s): CKTOTAL, CKMB, CKMBINDEX, TROPONINI in the last 168 hours. BNP (last 3 results) No results for input(s): PROBNP in the last 8760 hours. HbA1C: No results for input(s): HGBA1C in the last 72 hours. CBG: Recent Labs  Lab 11/09/19 1157 11/09/19 1639 11/09/19 2116 11/10/19 0606 11/10/19 0934  GLUCAP 143* 172* 189* 198* 180*   Lipid Profile: No results for input(s): CHOL, HDL, LDLCALC, TRIG, CHOLHDL,  LDLDIRECT in the last 72 hours. Thyroid Function Tests: No results for input(s): TSH, T4TOTAL, FREET4, T3FREE, THYROIDAB in the last 72 hours. Anemia Panel: No results for input(s): VITAMINB12, FOLATE, FERRITIN, TIBC, IRON, RETICCTPCT in the last 72 hours. Sepsis Labs: No results for input(s): PROCALCITON, LATICACIDVEN in the last 168 hours.  No results found for this or any previous visit (from the past 240 hour(s)).       Radiology Studies: No results found.      Scheduled Meds: . brimonidine  1 drop Left Eye BID  . calcium acetate  1,334 mg Oral TID WC  . carvedilol  12.5 mg Oral BID WC  . Chlorhexidine Gluconate Cloth  6 each Topical Q0600  . darbepoetin (ARANESP) injection - NON-DIALYSIS  100 mcg Subcutaneous Q Tue-1800  . dorzolamide-timolol  1 drop Left Eye BID  . gabapentin  100 mg Oral Daily  . gatifloxacin  1 drop Left Eye QID  . insulin aspart  0-9 Units Subcutaneous TID WC  . latanoprost  1 drop Left Eye QHS  . losartan  25 mg Oral Daily  . potassium chloride  40 mEq Oral Once  . senna-docusate  1 tablet Oral BID  . torsemide  100 mg  Oral Daily   Continuous Infusions: . sodium chloride 250 mL (11/06/19 1453)     LOS: 26 days    Time spent: 25 minutes   Domenic Polite, MD [facility]   11/10/2019, 10:31 AM

## 2019-11-10 NOTE — Plan of Care (Signed)
  Problem: Activity: Goal: Capacity to carry out activities will improve Outcome: Progressing   Problem: Coping: Goal: Level of anxiety will decrease Outcome: Progressing

## 2019-11-11 ENCOUNTER — Inpatient Hospital Stay (HOSPITAL_COMMUNITY): Payer: Medicaid Other

## 2019-11-11 LAB — CBC
HCT: 27.9 % — ABNORMAL LOW (ref 39.0–52.0)
Hemoglobin: 8.9 g/dL — ABNORMAL LOW (ref 13.0–17.0)
MCH: 29.8 pg (ref 26.0–34.0)
MCHC: 31.9 g/dL (ref 30.0–36.0)
MCV: 93.3 fL (ref 80.0–100.0)
Platelets: 227 10*3/uL (ref 150–400)
RBC: 2.99 MIL/uL — ABNORMAL LOW (ref 4.22–5.81)
RDW: 17.7 % — ABNORMAL HIGH (ref 11.5–15.5)
WBC: 9.2 10*3/uL (ref 4.0–10.5)
nRBC: 0 % (ref 0.0–0.2)

## 2019-11-11 LAB — RENAL FUNCTION PANEL
Albumin: 2.5 g/dL — ABNORMAL LOW (ref 3.5–5.0)
Anion gap: 12 (ref 5–15)
BUN: 44 mg/dL — ABNORMAL HIGH (ref 6–20)
CO2: 26 mmol/L (ref 22–32)
Calcium: 8.7 mg/dL — ABNORMAL LOW (ref 8.9–10.3)
Chloride: 97 mmol/L — ABNORMAL LOW (ref 98–111)
Creatinine, Ser: 4.16 mg/dL — ABNORMAL HIGH (ref 0.61–1.24)
GFR calc Af Amer: 18 mL/min — ABNORMAL LOW (ref >60)
GFR calc non Af Amer: 16 mL/min — ABNORMAL LOW (ref >60)
Glucose, Bld: 119 mg/dL — ABNORMAL HIGH (ref 70–99)
Phosphorus: 5.1 mg/dL — ABNORMAL HIGH (ref 2.5–4.6)
Potassium: 3.4 mmol/L — ABNORMAL LOW (ref 3.5–5.1)
Sodium: 135 mmol/L (ref 135–145)

## 2019-11-11 LAB — GLUCOSE, CAPILLARY
Glucose-Capillary: 124 mg/dL — ABNORMAL HIGH (ref 70–99)
Glucose-Capillary: 99 mg/dL (ref 70–99)
Glucose-Capillary: 251 mg/dL — ABNORMAL HIGH (ref 70–99)

## 2019-11-11 MED ORDER — HEPARIN SODIUM (PORCINE) 1000 UNIT/ML IJ SOLN
INTRAMUSCULAR | Status: AC
  Administered 2019-11-11: 1000 [IU]
  Filled 2019-11-11: qty 4

## 2019-11-11 MED ORDER — LOSARTAN POTASSIUM 50 MG PO TABS
50.0000 mg | ORAL_TABLET | Freq: Every day | ORAL | Status: DC
  Filled 2019-11-11: qty 1

## 2019-11-11 NOTE — Progress Notes (Signed)
[facility] Progress Note   New start.  Assessment/ Plan:   AKI/CKD stage IV- now progressed to ESRD in setting of acute on chronic diastolic CHF and diuresis.  -Dialysis Access:RIJTDC on 5/13.1st stage BBF placed5/14/2021. Plan to follow-up with vascular in 6 weeks.   - HD today 4hr, 4K/2.5Ca, 3-4L UF as tolerated, no heparin - CLIPped to [facility], making arrangements for transportation and home assistance.  2. Acute on chronic diastolic CHF-vol OL today - maximize UF with HD today; on torsemide 100 daily, continue.  Extra IV diuretics tomorrow vs extra HD based on volume.  3. Urinary retention complicated by hematuria after foley thought to be due to urethral stricture.  Urology has followed.  No bleeding x 2 days, foley out today.  Will need Bladder scan to ensure no further retention. Ordered aShfit. 4. Anemia of CKD stage IV- hgbdropped to 6.8 and received a unit of PRBC's at that time and has risen to 9's.Received 2 doses IV feraheme on 5/1 and Aranesp 40 mcg on 4/30. ESA increased to 156mcg on 5/10 and 5/18. 5/25/ Hb 9.2, CTM.  5. DM type 2 with hyperglycemia - per primary svc 6. Bones-phosphorus is improving with dialysis. Continue PhosLo at current dose. - PTH 172; phos 4.4 (5/25) 7. Hypertension:  On coreg 12.5 BID, still HTN; Added losartan 25 5/26, titrate to 50 daily and work on voluem status. 8. Hypokalemia : repleted by primary yest ; use 4K today for HD  Subjective:    He denies complaints but on 6L today, edema on CXR.   UOP 1.2L yesterday, per chart net neg 0.1L.    Objective:   BP (!) 185/100 (BP Location: Left Arm)   Pulse 76   Temp (!) 97.4 F (36.3 C) (Oral)   Resp 20   Ht 5\' 3"  (1.6 m)   Wt 73 kg   SpO2 99%   BMI 28.52 kg/m   Intake/Output Summary (Last 24 hours) at 11/11/2019 0931 Last data filed at 11/11/2019 0900 Gross per 24 hour  Intake 886.73 ml  Output 990 ml  Net -103.27 ml   Weight change: 3.81 kg  Physical  Exam: Gen: NAD, lying in bed CVS: no rub, normal rate Resp: basilar rales Abd: +BS, soft, NT/ND Ext:2+ dependent LE edema - about the same as yesterday GU: scrotal edema improved, foley in without blood noted Access: rt BBFgood bruit, RIJ TC  Imaging: DG CHEST PORT 1 VIEW  Result Date: 11/11/2019 CLINICAL DATA:  Hypoxia EXAM: PORTABLE CHEST 1 VIEW COMPARISON:  Portable exam 0823 hours compared to 10/25/2019 FINDINGS: RIGHT jugular line with tip projecting over SVC. Upper normal heart size. Stable mediastinal contours. BILATERAL pleural effusions and pulmonary edema again identified. No pneumothorax or acute osseous findings. IMPRESSION: Persistent pulmonary edema and BILATERAL pleural effusions. Electronically Signed   By: Lavonia Dana M.D.   On: 11/11/2019 08:50    Labs: BMET Recent Labs  Lab 11/05/19 0422 11/06/19 0618 11/07/19 0706 11/08/19 0607 11/09/19 0454 11/10/19 0550 11/11/19 0536  NA 136 136 135 135 135 134* 135  K 3.8 3.9 3.8 3.5 3.4* 3.1* 3.4*  CL 99 97* 100 100 98 95* 97*  CO2 27 23 26 24 26 25 26   GLUCOSE 118* 120* 121* 100* 117* 192* 119*  BUN 11 21* 15 22* 31* 39* 44*  CREATININE 2.28* 3.24* 2.60* 3.20* 3.50* 3.80* 4.16*  CALCIUM 8.3* 8.4* 8.1* 8.4* 8.4* 8.6* 8.7*  PHOS 2.5 3.8 3.1 3.8 4.4 4.8* 5.1*  CBC Recent Labs  Lab 11/07/19 0707 11/08/19 0607 11/09/19 0454 11/11/19 0536  WBC 10.4 10.9* 12.0* 9.2  HGB 9.5* 9.3* 9.2* 8.9*  HCT 29.0* 29.1* 28.3* 27.9*  MCV 90.1 93.0 93.1 93.3  PLT 213 223 228 227    Medications:    . brimonidine  1 drop Left Eye BID  . calcium acetate  1,334 mg Oral TID WC  . carvedilol  12.5 mg Oral BID WC  . Chlorhexidine Gluconate Cloth  6 each Topical Q0600  . darbepoetin (ARANESP) injection - NON-DIALYSIS  100 mcg Subcutaneous Q Tue-1800  . dorzolamide-timolol  1 drop Left Eye BID  . gabapentin  100 mg Oral Daily  . gatifloxacin  1 drop Left Eye QID  . insulin aspart  0-9 Units Subcutaneous TID WC  . latanoprost   1 drop Left Eye QHS  . losartan  25 mg Oral Daily  . senna-docusate  1 tablet Oral BID  . torsemide  100 mg Oral Daily    Jannifer Hick MD [facility] Pager [phone]

## 2019-11-11 NOTE — Progress Notes (Signed)
Pt. Refused standing weight. Bed weight obtained by NT.

## 2019-11-11 NOTE — Progress Notes (Signed)
Renal Navigator met with patient at HD bedside to follow up. He states he does not recall Navigator trying to speak with him in his room the other day. Navigator informed him of OP HD seat time and transportation that is now all set for him when he is medically ready for discharge. Navigator asked him if he thinks he is able to get himself up and ready for the [employer] [facility] van to pick him up for HD. He replied, "maybe if they come in and help me." Navigator and HD RN explained to patient that the [employer] drivers will NOT be able to go in to patient's home to assist him in getting ready for his appointments. Navigator remains concerned that patient may not be able to manage OP HD 3 times per week without assistance at home. Patient reports, "I think I can."  Alphonzo Cruise, [place] Renal Navigator [phone]

## 2019-11-11 NOTE — Progress Notes (Addendum)
PROGRESS NOTE    [facility]  HYI:[id number] DOB: Sep 28, 1970 DOA: 10/13/2019 PCP: Marliss Coots, NP   Brief Narrative: 49 year old with past medical history significant for hypertension, diabetes, gastroparesis, blindness, CKD stage IV, chronic diastolic heart failure admitted with diarrhea, weakness and failure to thrive.  He was recently admitted on 4/10--12 for acute on chronic diastolic heart failure.   He presented on 4/28 and was found to have severe metab acidosis, received bicarb IV fluid and was admitted.  He was having diarrhea and has since then resolved.  C diff and GI pathogen was negative.  Patient subsequently developed progressive fluid overload with lower extremity edema and acute on chronic diastolic heart failure.  He was a started on Lasix drip and metolazone but remain fluid overloaded with oliguria and uremic symptoms.  Patient had TDC and started hemodialysis on 10/28/2019.  Patient had right arm AV graft on 14.  Hemodialysis is switched from M,W,F to TTS due to census.  Patient is still volume overload. Patient develops urethral bleed after foley catheter was removed. Patient hb drop to 6. He received 2 units PRBC. Received DDAVP. Foley catheter was placed again. Urology consulted. Might need cystoscopy if bleeding persist. After placement of foley catheter bleeding has decreased   Assessment & Plan:   1-Acute Hypoxic Respiratory Failure due to Acute on Chronic Diastolic Heart Failure Exacerbation: Volume overload: -Improving,  still remains fluid overloaded with 2+ lower extremity edema and abdominal wall edema  -Currently on 6 to 7 L of O2, not on any oxygen at baseline -continue hemodialysis per renal, continue to lower dry weight as tolerated -Repeat chest x-ray today  Acute on chronic diastolic heart failure exacerbation, volume overload in the setting of renal failure -Had poor response to diuretics, developed oliguria, uremia Started hemodialysis Volume  management with HD>  Still has abdominal wall edema and 2+ lower extremity edema, hypoxia  AKI on CKD stage IV progressing to end-stage renal disease oliguria now new dialysis Right arm aVF on 5/14 need to follow-up with [facility] in 6 weeks. TDC and started on hemodialysis on 5/13 HD TTS  Acute blood loss anemia; and anemia of chronic renal diseases.  Urethral bleeding Hb down to 6. S/P Transfused 2 units PRBC 5/22 -Hemoglobin now stable around 9 range Appreciate Dr Ralene Muskrat input -Hematuria was secondary to Foley trauma after catheter removal, Foley catheter was replaced, urine is starting to clear but did have some hematuria last night per staff -Discontinue Foley catheter today, no further hematuria  Anion gap metabolic acidosis due to renal failure:  -Resolved.  Hyperphosphatemia: Improved  Acute metabolic encephalopathy:  -Secondary to uremia Still with confusion and hallucination at times. ammonia mildly elevated (38). Received a dose of lactulose. Ammonia decreased to 37  Hypothermia: Resolved.  Diabetes type 2: Continue with low-dose Lantus and sliding scale insulin -CBGs are stable  Hypertension;  -Started on low-dose carvedilol, needs fluid removal on HD  Hyperlipidemia Continue with statins.  Diarrhea Resolved  0Estimated body mass index is 28.52 kg/m as calculated from the following:   Height as of this encounter: 5\' 3"  (1.6 m).   Weight as of this encounter: 73 kg.   DVT prophylaxis: Heparin Code Status: Full code Family Communication: No family at bedside.  Disposition Plan:  Status is: Inpatient  Remains inpatient appropriate because ; fluid overloaded, hypoxic, urethral bleeding    Dispo: The patient is from: Home              Anticipated d/c  is to: needs SNF but doesn't have benefits              Anticipated d/c date is: ? 48 hours              Patient currently is not medically stable to d/c.     Consultants:    Nephrology  IR  PMT  Vascular  Procedures:   Hemodialysis  Antimicrobials:  None  Subjective: -Complains of abdominal wall tightness and swelling, shortness of breath Objective: Vitals:   11/10/19 2140 11/11/19 0050 11/11/19 0054 11/11/19 0333  BP: (!) 165/89  (!) 175/101 (!) 185/100  Pulse: 78  77 76  Resp: 20  20 20   Temp: 97.7 F (36.5 C)  98.1 F (36.7 C) (!) 97.4 F (36.3 C)  TempSrc: Oral  Oral Oral  SpO2: 100%  98% 99%  Weight:  73 kg    Height:        Intake/Output Summary (Last 24 hours) at 11/11/2019 1037 Last data filed at 11/11/2019 1012 Gross per 24 hour  Intake 886.73 ml  Output 1390 ml  Net -503.27 ml   Filed Weights   11/09/19 0413 11/10/19 0353 11/11/19 0050  Weight: 59.8 kg 69.2 kg 73 kg    Examination:  General exam: Chronically ill blind pleasant male sitting up in bed, awake alert oriented x3  Respiratory system: Fine bilateral rales and diminished breath sounds at the bases Cardiovascular system: S1-S2, regular rate rhythm Gastrointestinal system: Soft, nontender, abdominal wall edema  GU; Foley catheter, urine is clear Central nervous system; moves all extremities, no localizing signs Extremities;  2+ edema   Data Reviewed: I have personally reviewed following labs and imaging studies  CBC: Recent Labs  Lab 11/06/19 0712 11/07/19 0707 11/08/19 0607 11/09/19 0454 11/11/19 0536  WBC 9.4 10.4 10.9* 12.0* 9.2  HGB 6.8* 9.5* 9.3* 9.2* 8.9*  HCT 22.0* 29.0* 29.1* 28.3* 27.9*  MCV 94.4 90.1 93.0 93.1 93.3  PLT 209 213 223 228 403   Basic Metabolic Panel: Recent Labs  Lab 11/07/19 0706 11/08/19 0607 11/09/19 0454 11/10/19 0550 11/11/19 0536  NA 135 135 135 134* 135  K 3.8 3.5 3.4* 3.1* 3.4*  CL 100 100 98 95* 97*  CO2 26 24 26 25 26   GLUCOSE 121* 100* 117* 192* 119*  BUN 15 22* 31* 39* 44*  CREATININE 2.60* 3.20* 3.50* 3.80* 4.16*  CALCIUM 8.1* 8.4* 8.4* 8.6* 8.7*  PHOS 3.1 3.8 4.4 4.8* 5.1*   GFR: Estimated  Creatinine Clearance: 19.7 mL/min (A) (by C-G formula based on SCr of 4.16 mg/dL (H)). Liver Function Tests: Recent Labs  Lab 11/07/19 0706 11/08/19 0607 11/09/19 0454 11/10/19 0550 11/11/19 0536  ALBUMIN 2.4* 2.5* 2.5* 2.4* 2.5*   No results for input(s): LIPASE, AMYLASE in the last 168 hours. Recent Labs  Lab 11/05/19 0422 11/09/19 1537  AMMONIA 37* 47*   Coagulation Profile: No results for input(s): INR, PROTIME in the last 168 hours. Cardiac Enzymes: No results for input(s): CKTOTAL, CKMB, CKMBINDEX, TROPONINI in the last 168 hours. BNP (last 3 results) No results for input(s): PROBNP in the last 8760 hours. HbA1C: No results for input(s): HGBA1C in the last 72 hours. CBG: Recent Labs  Lab 11/10/19 0934 11/10/19 1203 11/10/19 1643 11/10/19 2140 11/11/19 0626  GLUCAP 180* 130* 142* 170* 124*   Lipid Profile: No results for input(s): CHOL, HDL, LDLCALC, TRIG, CHOLHDL, LDLDIRECT in the last 72 hours. Thyroid Function Tests: No results for input(s): TSH, T4TOTAL, FREET4, T3FREE,  THYROIDAB in the last 72 hours. Anemia Panel: No results for input(s): VITAMINB12, FOLATE, FERRITIN, TIBC, IRON, RETICCTPCT in the last 72 hours. Sepsis Labs: No results for input(s): PROCALCITON, LATICACIDVEN in the last 168 hours.  No results found for this or any previous visit (from the past 240 hour(s)).       Radiology Studies: DG CHEST PORT 1 VIEW  Result Date: 11/11/2019 CLINICAL DATA:  Hypoxia EXAM: PORTABLE CHEST 1 VIEW COMPARISON:  Portable exam 0823 hours compared to 10/25/2019 FINDINGS: RIGHT jugular line with tip projecting over SVC. Upper normal heart size. Stable mediastinal contours. BILATERAL pleural effusions and pulmonary edema again identified. No pneumothorax or acute osseous findings. IMPRESSION: Persistent pulmonary edema and BILATERAL pleural effusions. Electronically Signed   By: Lavonia Dana M.D.   On: 11/11/2019 08:50        Scheduled Meds: . brimonidine   1 drop Left Eye BID  . calcium acetate  1,334 mg Oral TID WC  . carvedilol  12.5 mg Oral BID WC  . Chlorhexidine Gluconate Cloth  6 each Topical Q0600  . darbepoetin (ARANESP) injection - NON-DIALYSIS  100 mcg Subcutaneous Q Tue-1800  . dorzolamide-timolol  1 drop Left Eye BID  . gabapentin  100 mg Oral Daily  . gatifloxacin  1 drop Left Eye QID  . insulin aspart  0-9 Units Subcutaneous TID WC  . latanoprost  1 drop Left Eye QHS  . losartan  25 mg Oral Daily  . senna-docusate  1 tablet Oral BID  . torsemide  100 mg Oral Daily   Continuous Infusions: . sodium chloride 250 mL (11/06/19 1453)     LOS: 27 days    Time spent: 25 minutes   Domenic Polite, MD [facility]   11/11/2019, 10:37 AM

## 2019-11-12 LAB — RENAL FUNCTION PANEL
Albumin: 2.4 g/dL — ABNORMAL LOW (ref 3.5–5.0)
Anion gap: 9 (ref 5–15)
BUN: 24 mg/dL — ABNORMAL HIGH (ref 6–20)
CO2: 26 mmol/L (ref 22–32)
Calcium: 8 mg/dL — ABNORMAL LOW (ref 8.9–10.3)
Chloride: 98 mmol/L (ref 98–111)
Creatinine, Ser: 2.66 mg/dL — ABNORMAL HIGH (ref 0.61–1.24)
GFR calc Af Amer: 32 mL/min — ABNORMAL LOW (ref >60)
GFR calc non Af Amer: 27 mL/min — ABNORMAL LOW (ref >60)
Glucose, Bld: 218 mg/dL — ABNORMAL HIGH (ref 70–99)
Phosphorus: 3.5 mg/dL (ref 2.5–4.6)
Potassium: 3.5 mmol/L (ref 3.5–5.1)
Sodium: 133 mmol/L — ABNORMAL LOW (ref 135–145)

## 2019-11-12 LAB — CBC
HCT: 27.6 % — ABNORMAL LOW (ref 39.0–52.0)
Hemoglobin: 8.6 g/dL — ABNORMAL LOW (ref 13.0–17.0)
MCH: 29.6 pg (ref 26.0–34.0)
MCHC: 31.2 g/dL (ref 30.0–36.0)
MCV: 94.8 fL (ref 80.0–100.0)
Platelets: 161 10*3/uL (ref 150–400)
RBC: 2.91 MIL/uL — ABNORMAL LOW (ref 4.22–5.81)
RDW: 17.5 % — ABNORMAL HIGH (ref 11.5–15.5)
WBC: 8.5 10*3/uL (ref 4.0–10.5)
nRBC: 0 % (ref 0.0–0.2)

## 2019-11-12 LAB — GLUCOSE, CAPILLARY
Glucose-Capillary: 200 mg/dL — ABNORMAL HIGH (ref 70–99)
Glucose-Capillary: 150 mg/dL — ABNORMAL HIGH (ref 70–99)
Glucose-Capillary: 203 mg/dL — ABNORMAL HIGH (ref 70–99)
Glucose-Capillary: 153 mg/dL — ABNORMAL HIGH (ref 70–99)

## 2019-11-12 MED ORDER — INSULIN ASPART 100 UNIT/ML ~~LOC~~ SOLN
2.0000 [IU] | Freq: Three times a day (TID) | SUBCUTANEOUS | Status: DC
  Administered 2019-11-12 – 2019-11-19 (×18): 2 [IU] via SUBCUTANEOUS

## 2019-11-12 MED ORDER — CHLORHEXIDINE GLUCONATE CLOTH 2 % EX PADS
6.0000 | MEDICATED_PAD | Freq: Every day | CUTANEOUS | Status: DC
  Administered 2019-11-13 – 2019-11-15 (×3): 6 via TOPICAL

## 2019-11-12 MED ORDER — METOLAZONE 5 MG PO TABS
5.0000 mg | ORAL_TABLET | Freq: Once | ORAL | Status: AC
  Administered 2019-11-12: 5 mg via ORAL
  Filled 2019-11-12: qty 1

## 2019-11-12 MED ORDER — LOSARTAN POTASSIUM 50 MG PO TABS
100.0000 mg | ORAL_TABLET | Freq: Every day | ORAL | Status: DC
  Administered 2019-11-12 – 2019-11-19 (×8): 100 mg via ORAL
  Filled 2019-11-12 (×8): qty 2

## 2019-11-12 MED ORDER — FUROSEMIDE 10 MG/ML IJ SOLN
100.0000 mg | Freq: Once | INTRAVENOUS | Status: AC
  Administered 2019-11-12: 100 mg via INTRAVENOUS
  Filled 2019-11-12: qty 10

## 2019-11-12 NOTE — Progress Notes (Signed)
PT Cancellation Note  Patient Details Name: Keelin Sheridan MRN: [id number] DOB: 06/24/1970   Cancelled Treatment:    Reason Eval/Treat Not Completed: Other (comment)(refused on second attempt)   Denice Paradise 11/12/2019, 2:32 PM  Dawn W,PT Acute Rehabilitation Services Pager:  [phone]  Office:  [phone]

## 2019-11-12 NOTE — Progress Notes (Addendum)
[facility] Progress Note   New start.  Assessment/ Plan:   AKI/CKD stage IV- now progressed to ESRD in setting of acute on chronic diastolic CHF and diuresis.  -Dialysis Access:RIJTDC on 5/13.1st stage BBF placed5/14/2021. Plan to follow-up with vascular in 6 weeks.   - HD tomorrow 4hr, 4K/2.5Ca, 3-4L UF as tolerated, no heparin - CLIPped to [facility], making arrangements for transportation and home assistance. 2. Acute on chronic diastolic CHF-continues to be volume overloaded will give metolazone and Lasix today.  Continue on torsemide 100 daily and ultrafiltration with dialysis 3. Urinary retention complicated by hematuria after foley thought to be due to urethral stricture.  Urology has followed.  No bleeding x 2 days, foley out today.  Will need Bladder scan to ensure no further retention. Ordered qShfit. 4. Anemia of CKD stage IV- hgbdropped to 6.8 and received a unit of PRBC's at that time and has risen to 9's.Received 2 doses IV feraheme on 5/1 and Aranesp 40 mcg on 4/30. ESA increased to 171mcg on 5/10 and 5/18. 5/28/ Hb 8.6, CTM.  5. DM type 2 with hyperglycemia - per primary svc 6. Bones-phosphorus is improving with dialysis. Continue PhosLo at current dose. - PTH 172; phos improved to 3.5 on 5/28 7. Hypertension:  On coreg 12.5 BID, still HTN; Added losartan 25 5/26, increase losartan 200 mg daily on 5/28 8. Hypokalemia : Resolved.  Continue 4K with dialysis  Subjective:    No complaints today.  Continues on nasal cannula.  Denies shortness of breath.  Edema improved.  Good urine output and ultrafiltration with dialysis yesterday.  Has been more hypertensive.   Objective:   BP (!) 188/102 (BP Location: Left Arm)   Pulse 74   Temp 98.3 F (36.8 C) (Oral)   Resp 18   Ht 5\' 3"  (1.6 m)   Wt 69.9 kg   SpO2 100%   BMI 27.28 kg/m   Intake/Output Summary (Last 24 hours) at 11/12/2019 1242 Last data filed at 11/12/2019 1011 Gross per 24 hour   Intake 598 ml  Output 4400 ml  Net -3802 ml   Weight change: -0.429 kg  Physical Exam: Gen: NAD, lying in bed CVS: normal rate Resp: Bilateral chest rise, no increased work of breathing Abd: +BS, soft, NT/ND Ext:2+ pitting edema on the left lower extremity, 1+ in the right lower extremity GU: scrotal edema improved Access: rt BBFgood bruit, RIJ TC  Imaging: DG CHEST PORT 1 VIEW  Result Date: 11/11/2019 CLINICAL DATA:  Hypoxia EXAM: PORTABLE CHEST 1 VIEW COMPARISON:  Portable exam 0823 hours compared to 10/25/2019 FINDINGS: RIGHT jugular line with tip projecting over SVC. Upper normal heart size. Stable mediastinal contours. BILATERAL pleural effusions and pulmonary edema again identified. No pneumothorax or acute osseous findings. IMPRESSION: Persistent pulmonary edema and BILATERAL pleural effusions. Electronically Signed   By: Lavonia Dana M.D.   On: 11/11/2019 08:50    Labs: BMET Recent Labs  Lab 11/06/19 0618 11/07/19 0706 11/08/19 0607 11/09/19 0454 11/10/19 0550 11/11/19 0536 11/12/19 0354  NA 136 135 135 135 134* 135 133*  K 3.9 3.8 3.5 3.4* 3.1* 3.4* 3.5  CL 97* 100 100 98 95* 97* 98  CO2 23 26 24 26 25 26 26   GLUCOSE 120* 121* 100* 117* 192* 119* 218*  BUN 21* 15 22* 31* 39* 44* 24*  CREATININE 3.24* 2.60* 3.20* 3.50* 3.80* 4.16* 2.66*  CALCIUM 8.4* 8.1* 8.4* 8.4* 8.6* 8.7* 8.0*  PHOS 3.8 3.1 3.8 4.4 4.8* 5.1*  3.5   CBC Recent Labs  Lab 11/08/19 0607 11/09/19 0454 11/11/19 0536 11/12/19 0354  WBC 10.9* 12.0* 9.2 8.5  HGB 9.3* 9.2* 8.9* 8.6*  HCT 29.1* 28.3* 27.9* 27.6*  MCV 93.0 93.1 93.3 94.8  PLT 223 228 227 161    Medications:    . brimonidine  1 drop Left Eye BID  . calcium acetate  1,334 mg Oral TID WC  . carvedilol  12.5 mg Oral BID WC  . Chlorhexidine Gluconate Cloth  6 each Topical Q0600  . darbepoetin (ARANESP) injection - NON-DIALYSIS  100 mcg Subcutaneous Q Tue-1800  . dorzolamide-timolol  1 drop Left Eye BID  . gabapentin  100 mg  Oral Daily  . gatifloxacin  1 drop Left Eye QID  . insulin aspart  0-9 Units Subcutaneous TID WC  . insulin aspart  2 Units Subcutaneous TID WC  . latanoprost  1 drop Left Eye QHS  . losartan  100 mg Oral Daily  . metolazone  5 mg Oral Once  . senna-docusate  1 tablet Oral BID  . torsemide  100 mg Oral Daily

## 2019-11-12 NOTE — Progress Notes (Signed)
Chaplain responded to PMT referral for Pt. spiritual care.  The chaplain observes the Pt. is lying on his side with a pillow in front of his face.  The pillow remains in the same place throughout the chaplain's visit.  The chaplain's attempts to engage the Pt. in reflective listening were unsuccessful.  The chaplain will return for a F/U visit.

## 2019-11-12 NOTE — Progress Notes (Signed)
PT Cancellation Note  Patient Details Name: Christopher Davila MRN: [id number] DOB: 05-05-1971   Cancelled Treatment:    Reason Eval/Treat Not Completed: Other (comment)(Come back at 2 pm, pt asked. Will return as able. )   Denice Paradise 11/12/2019, 11:48 AM  Shanyce Daris W,PT Acute Rehabilitation Services Pager:  [phone]  Office:  [phone]

## 2019-11-12 NOTE — Plan of Care (Signed)
  Problem: Activity: Goal: Risk for activity intolerance will decrease Outcome: Progressing   Problem: Coping: Goal: Level of anxiety will decrease Outcome: Progressing   Problem: Safety: Goal: Ability to remain free from injury will improve Outcome: Progressing   

## 2019-11-12 NOTE — Progress Notes (Signed)
Results for ARTAVIS, COWIE (MRN [id number]) as of 11/12/2019 10:11  Ref. Range 11/10/2019 21:40 11/11/2019 06:26 11/11/2019 16:18 11/11/2019 21:52 11/12/2019 06:15  Glucose-Capillary Latest Ref Range: 70 - 99 mg/dL 170 (H) 124 (H) 99 251 (H) 200 (H)  Noted that blood sugars have been greater than 200 mg/dl.  Recommend adding Novolog 3 units TID with meals if patient eats at least 50% of meal and blood sugars continue to be elevated.   Harvel Ricks RN BSN CDE Diabetes Coordinator Pager: [phone]  8am-5pm

## 2019-11-12 NOTE — Plan of Care (Signed)
  Problem: Education: Goal: Ability to demonstrate management of disease process will improve Outcome: Progressing Goal: Ability to verbalize understanding of medication therapies will improve Outcome: Progressing Goal: Individualized Educational Video(s) Outcome: Progressing   Problem: Activity: Goal: Capacity to carry out activities will improve Outcome: Progressing   Problem: Education: Goal: Knowledge of General Education information will improve Description: Including pain rating scale, medication(s)/side effects and non-pharmacologic comfort measures Outcome: Progressing   

## 2019-11-12 NOTE — Progress Notes (Signed)
PROGRESS NOTE    Christopher Davila  UXN:[id number] DOB: Jun 04, 1971 DOA: 10/13/2019 PCP: Marliss Coots, NP   Brief Narrative: 49 year old with past medical history significant for hypertension, diabetes, gastroparesis, blindness, CKD stage IV, chronic diastolic heart failure admitted with diarrhea, weakness and failure to thrive.  He was recently admitted on 4/10--12 for acute on chronic diastolic heart failure.   He presented on 4/28 and was found to have severe metab acidosis, received bicarb IV fluid and was admitted.  He was having diarrhea and has since then resolved.  C diff and GI pathogen was negative.  Patient subsequently developed progressive fluid overload with lower extremity edema and acute on chronic diastolic heart failure.  He was a started on Lasix drip and metolazone but remain fluid overloaded with oliguria and uremic symptoms.  Patient had TDC and started hemodialysis on 10/28/2019.  Patient had right arm AV graft on 14.  Hemodialysis is switched from M,W,F to TTS due to census.  Patient is still volume overload. Patient develops urethral bleed after foley catheter was removed. Patient hb drop to 6. He received 2 units PRBC. Received DDAVP. Foley catheter was placed again. Urology consulted. Might need cystoscopy if bleeding persist. After placement of foley catheter bleeding has decreased   Assessment & Plan:   1-Acute Hypoxic Respiratory Failure due to Acute on Chronic Diastolic Heart Failure Exacerbation: Volume overload: -Improving, was profoundly fluid overloaded yesterday with pulmonary edema and bilateral pleural effusions, on 6 L of O2 -Volume status improving, still with moderate oxygen requirement, continue to lower dry weight as tolerated on dialysis -Remains on diuretics as well -HD per renal  Acute on chronic diastolic heart failure exacerbation, volume overload in the setting of renal failure -Had poor response to diuretics, developed oliguria, uremia -Started  hemodialysis, still remains fluid overloaded  AKI on CKD stage IV progressing to end-stage renal disease oliguria now new dialysis Right arm aVF on 5/14 need to follow-up with [facility] in 6 weeks. TDC and started on hemodialysis on 5/13 HD TTS  Acute blood loss anemia; and anemia of chronic renal diseases.  Urethral bleeding Hb down to 6. S/P Transfused 2 units PRBC 5/22 -Hemoglobin now stable around 9 range Appreciate Dr Ralene Muskrat input -Hematuria was secondary to Foley trauma after catheter removal, Foley catheter was replaced, urine cleared up, Foley catheter was removed 5/27  Anion gap metabolic acidosis due to renal failure:  -Resolved.  Hyperphosphatemia: Improved  Acute metabolic encephalopathy:  -Secondary to uremia Still with confusion and hallucination at times. ammonia mildly elevated (38). Received a dose of lactulose. Ammonia decreased to 37  Hypothermia: Resolved.  Diabetes type 2: Continue with low-dose Lantus and sliding scale insulin -CBGs slightly higher, add low-dose NovoLog  Hypertension;  -Started on low-dose carvedilol, needs fluid removal on HD  Hyperlipidemia Continue with statins.  Diarrhea Resolved  0Estimated body mass index is 27.28 kg/m as calculated from the following:   Height as of this encounter: 5\' 3"  (1.6 m).   Weight as of this encounter: 69.9 kg.   DVT prophylaxis: Heparin Code Status: Full code Family Communication: No family at bedside.,  Called and updated cousin [name] Disposition Plan:  Status is: Inpatient  Remains inpatient appropriate because ; fluid overloaded, hypoxic, urethral bleeding    Dispo: The patient is from: Home              Anticipated d/c is to: needs SNF but doesn't have benefits, Cousin tells me that she is unable to care for him  as she works 2 jobs              Anticipated d/c date is: ? 63 hours              Patient currently is not medically stable to d/c and no safe DC plan     Consultants:    Nephrology  IR  PMT  Vascular  Procedures:   Hemodialysis  Antimicrobials:  None  Subjective: -Breathing a little better, still with abdominal wall tightness, swelling starting to improve Objective: Vitals:   11/12/19 0248 11/12/19 0337 11/12/19 1102 11/12/19 1129  BP:  (!) 172/98 (!) 182/99 (!) 188/102  Pulse:  79 74 74  Resp:  20 18   Temp:  99.1 F (37.3 C) 98.3 F (36.8 C)   TempSrc:  Oral Oral   SpO2:  100% 100% 100%  Weight: 69.9 kg     Height:        Intake/Output Summary (Last 24 hours) at 11/12/2019 1228 Last data filed at 11/12/2019 1011 Gross per 24 hour  Intake 598 ml  Output 4400 ml  Net -3802 ml   Filed Weights   11/11/19 1031 11/11/19 1436 11/12/19 0248  Weight: 72.6 kg 68.8 kg 69.9 kg    Examination:  General exam: Chronically ill blind male sitting up in bed, AAOx3, no distress HEENT no JVD CVS S1-S2 regular rate rhythm Lungs with fine bilateral rails, diminished breath sounds bases Abdomen is soft nontender, improving abdominal wall edema Extremities with 1+ edema    Data Reviewed: I have personally reviewed following labs and imaging studies  CBC: Recent Labs  Lab 11/07/19 0707 11/08/19 0607 11/09/19 0454 11/11/19 0536 11/12/19 0354  WBC 10.4 10.9* 12.0* 9.2 8.5  HGB 9.5* 9.3* 9.2* 8.9* 8.6*  HCT 29.0* 29.1* 28.3* 27.9* 27.6*  MCV 90.1 93.0 93.1 93.3 94.8  PLT 213 223 228 227 010   Basic Metabolic Panel: Recent Labs  Lab 11/08/19 0607 11/09/19 0454 11/10/19 0550 11/11/19 0536 11/12/19 0354  NA 135 135 134* 135 133*  K 3.5 3.4* 3.1* 3.4* 3.5  CL 100 98 95* 97* 98  CO2 24 26 25 26 26   GLUCOSE 100* 117* 192* 119* 218*  BUN 22* 31* 39* 44* 24*  CREATININE 3.20* 3.50* 3.80* 4.16* 2.66*  CALCIUM 8.4* 8.4* 8.6* 8.7* 8.0*  PHOS 3.8 4.4 4.8* 5.1* 3.5   GFR: Estimated Creatinine Clearance: 30.2 mL/min (A) (by C-G formula based on SCr of 2.66 mg/dL (H)). Liver Function Tests: Recent Labs  Lab 11/08/19 0607  11/09/19 0454 11/10/19 0550 11/11/19 0536 11/12/19 0354  ALBUMIN 2.5* 2.5* 2.4* 2.5* 2.4*   No results for input(s): LIPASE, AMYLASE in the last 168 hours. Recent Labs  Lab 11/09/19 1537  AMMONIA 47*   Coagulation Profile: No results for input(s): INR, PROTIME in the last 168 hours. Cardiac Enzymes: No results for input(s): CKTOTAL, CKMB, CKMBINDEX, TROPONINI in the last 168 hours. BNP (last 3 results) No results for input(s): PROBNP in the last 8760 hours. HbA1C: No results for input(s): HGBA1C in the last 72 hours. CBG: Recent Labs  Lab 11/11/19 0626 11/11/19 1618 11/11/19 2152 11/12/19 0615 11/12/19 1101  GLUCAP 124* 99 251* 200* 150*   Lipid Profile: No results for input(s): CHOL, HDL, LDLCALC, TRIG, CHOLHDL, LDLDIRECT in the last 72 hours. Thyroid Function Tests: No results for input(s): TSH, T4TOTAL, FREET4, T3FREE, THYROIDAB in the last 72 hours. Anemia Panel: No results for input(s): VITAMINB12, FOLATE, FERRITIN, TIBC, IRON, RETICCTPCT in the  last 72 hours. Sepsis Labs: No results for input(s): PROCALCITON, LATICACIDVEN in the last 168 hours.  No results found for this or any previous visit (from the past 240 hour(s)).       Radiology Studies: DG CHEST PORT 1 VIEW  Result Date: 11/11/2019 CLINICAL DATA:  Hypoxia EXAM: PORTABLE CHEST 1 VIEW COMPARISON:  Portable exam 0823 hours compared to 10/25/2019 FINDINGS: RIGHT jugular line with tip projecting over SVC. Upper normal heart size. Stable mediastinal contours. BILATERAL pleural effusions and pulmonary edema again identified. No pneumothorax or acute osseous findings. IMPRESSION: Persistent pulmonary edema and BILATERAL pleural effusions. Electronically Signed   By: Lavonia Dana M.D.   On: 11/11/2019 08:50        Scheduled Meds: . brimonidine  1 drop Left Eye BID  . calcium acetate  1,334 mg Oral TID WC  . carvedilol  12.5 mg Oral BID WC  . Chlorhexidine Gluconate Cloth  6 each Topical Q0600  .  darbepoetin (ARANESP) injection - NON-DIALYSIS  100 mcg Subcutaneous Q Tue-1800  . dorzolamide-timolol  1 drop Left Eye BID  . gabapentin  100 mg Oral Daily  . gatifloxacin  1 drop Left Eye QID  . insulin aspart  0-9 Units Subcutaneous TID WC  . insulin aspart  2 Units Subcutaneous TID WC  . latanoprost  1 drop Left Eye QHS  . losartan  100 mg Oral Daily  . senna-docusate  1 tablet Oral BID  . torsemide  100 mg Oral Daily   Continuous Infusions: . sodium chloride 250 mL (11/06/19 1453)     LOS: 28 days    Time spent: 25 minutes   Domenic Polite, MD [facility]   11/12/2019, 12:28 PM

## 2019-11-13 LAB — RENAL FUNCTION PANEL
Albumin: 2.5 g/dL — ABNORMAL LOW (ref 3.5–5.0)
Anion gap: 11 (ref 5–15)
BUN: 33 mg/dL — ABNORMAL HIGH (ref 6–20)
CO2: 27 mmol/L (ref 22–32)
Calcium: 8.4 mg/dL — ABNORMAL LOW (ref 8.9–10.3)
Chloride: 96 mmol/L — ABNORMAL LOW (ref 98–111)
Creatinine, Ser: 3.22 mg/dL — ABNORMAL HIGH (ref 0.61–1.24)
GFR calc Af Amer: 25 mL/min — ABNORMAL LOW (ref >60)
GFR calc non Af Amer: 22 mL/min — ABNORMAL LOW (ref >60)
Glucose, Bld: 102 mg/dL — ABNORMAL HIGH (ref 70–99)
Phosphorus: 4.3 mg/dL (ref 2.5–4.6)
Potassium: 3.1 mmol/L — ABNORMAL LOW (ref 3.5–5.1)
Sodium: 134 mmol/L — ABNORMAL LOW (ref 135–145)

## 2019-11-13 LAB — CBC
HCT: 28.5 % — ABNORMAL LOW (ref 39.0–52.0)
Hemoglobin: 9.1 g/dL — ABNORMAL LOW (ref 13.0–17.0)
MCH: 30.1 pg (ref 26.0–34.0)
MCHC: 31.9 g/dL (ref 30.0–36.0)
MCV: 94.4 fL (ref 80.0–100.0)
Platelets: 192 10*3/uL (ref 150–400)
RBC: 3.02 MIL/uL — ABNORMAL LOW (ref 4.22–5.81)
RDW: 16.9 % — ABNORMAL HIGH (ref 11.5–15.5)
WBC: 7.5 10*3/uL (ref 4.0–10.5)
nRBC: 0 % (ref 0.0–0.2)

## 2019-11-13 LAB — GLUCOSE, CAPILLARY
Glucose-Capillary: 181 mg/dL — ABNORMAL HIGH (ref 70–99)
Glucose-Capillary: 108 mg/dL — ABNORMAL HIGH (ref 70–99)
Glucose-Capillary: 159 mg/dL — ABNORMAL HIGH (ref 70–99)
Glucose-Capillary: 104 mg/dL — ABNORMAL HIGH (ref 70–99)

## 2019-11-13 MED ORDER — LIDOCAINE-PRILOCAINE 2.5-2.5 % EX CREA
1.0000 "application " | TOPICAL_CREAM | CUTANEOUS | Status: DC | PRN
  Filled 2019-11-13: qty 5

## 2019-11-13 MED ORDER — SODIUM CHLORIDE 0.9 % IV SOLN: 100.0000 mL | INTRAVENOUS | Status: DC | PRN

## 2019-11-13 MED ORDER — TRAMADOL HCL 50 MG PO TABS
50.0000 mg | ORAL_TABLET | Freq: Once | ORAL | Status: AC
  Administered 2019-11-13: 50 mg via ORAL
  Filled 2019-11-13: qty 1

## 2019-11-13 MED ORDER — INSULIN ASPART 100 UNIT/ML ~~LOC~~ SOLN
0.0000 [IU] | Freq: Three times a day (TID) | SUBCUTANEOUS | Status: DC
  Administered 2019-11-13 – 2019-11-15 (×6): 2 [IU] via SUBCUTANEOUS
  Administered 2019-11-15 – 2019-11-16 (×3): 1 [IU] via SUBCUTANEOUS
  Administered 2019-11-17: 2 [IU] via SUBCUTANEOUS
  Administered 2019-11-17: 3 [IU] via SUBCUTANEOUS
  Administered 2019-11-17: 1 [IU] via SUBCUTANEOUS
  Administered 2019-11-18: 3 [IU] via SUBCUTANEOUS
  Administered 2019-11-19: 2 [IU] via SUBCUTANEOUS

## 2019-11-13 MED ORDER — CARVEDILOL 12.5 MG PO TABS
12.5000 mg | ORAL_TABLET | Freq: Two times a day (BID) | ORAL | Status: DC
  Administered 2019-11-13 (×2): 12.5 mg via ORAL
  Filled 2019-11-13 (×2): qty 1

## 2019-11-13 MED ORDER — POTASSIUM CHLORIDE 20 MEQ PO PACK
20.0000 meq | PACK | Freq: Two times a day (BID) | ORAL | Status: AC
  Administered 2019-11-13 (×2): 20 meq via ORAL
  Filled 2019-11-13 (×2): qty 1

## 2019-11-13 MED ORDER — ALTEPLASE 2 MG IJ SOLR: 2.0000 mg | Freq: Once | INTRAMUSCULAR | Status: DC | PRN

## 2019-11-13 MED ORDER — METHOCARBAMOL 500 MG PO TABS
500.0000 mg | ORAL_TABLET | Freq: Once | ORAL | Status: AC
  Administered 2019-11-13: 500 mg via ORAL
  Filled 2019-11-13: qty 1

## 2019-11-13 MED ORDER — LOPERAMIDE HCL 2 MG PO CAPS
2.0000 mg | ORAL_CAPSULE | Freq: Once | ORAL | Status: AC
  Administered 2019-11-14: 2 mg via ORAL
  Filled 2019-11-13: qty 1

## 2019-11-13 MED ORDER — HEPARIN SODIUM (PORCINE) 1000 UNIT/ML IJ SOLN
INTRAMUSCULAR | Status: AC
  Administered 2019-11-13: 4000 [IU]
  Filled 2019-11-13: qty 4

## 2019-11-13 MED ORDER — LIDOCAINE HCL (PF) 1 % IJ SOLN: 5.0000 mL | INTRAMUSCULAR | Status: DC | PRN

## 2019-11-13 MED ORDER — HEPARIN SODIUM (PORCINE) 1000 UNIT/ML DIALYSIS: 1000.0000 [IU] | INTRAMUSCULAR | Status: DC | PRN

## 2019-11-13 MED ORDER — PENTAFLUOROPROP-TETRAFLUOROETH EX AERO: 1.0000 "application " | INHALATION_SPRAY | CUTANEOUS | Status: DC | PRN

## 2019-11-13 NOTE — Progress Notes (Signed)
[facility] Progress Note   New start.  Assessment/ Plan:   AKI/CKD stage IV- now progressed to ESRD in setting of acute on chronic diastolic CHF and diuresis.  -Dialysis Access:RIJTDC on 5/13.1st stage BBF placed5/14/2021. Plan to follow-up with vascular in 6 weeks.   - HD tomorrow 4hr, 4K/2.5Ca, 3-4L UF as tolerated, no heparin - CLIPped to [facility] TTS, making arrangements for transportation and home assistance. 2. Acute on chronic diastolic CHF-continues to be volume overloaded and given metolazone and Lasix yesterday.  4.2 L of ultrafiltration today.  Continue on torsemide 100 daily and ultrafiltration with dialysis 3. Urinary retention complicated by hematuria after foley thought to be due to urethral stricture.  Urology has followed.  No bleeding x 2 days, foley out today.  Will need Bladder scan to ensure no further retention. Ordered qShfit. 4. Anemia of CKD stage IV- hgbdropped to 6.8 and received a unit of PRBC's at that time and has risen to 9's.Received 2 doses IV feraheme on 5/1 and Aranesp 40 mcg on 4/30. ESA increased to 133mcg on 5/10 and 5/18. 5/28/ Hb 8.6, CTM.  5. DM type 2 with hyperglycemia - per primary svc 6. Bones-phosphorus is improving with dialysis. Continue PhosLo at current dose. - PTH 172;  7. Hypertension:  On coreg 12.5 BID, losartan increased to 100mg  daily on 5/28 8. Hypokalemia : returned today. 68meq x2 doses given today.  Continue 4K with dialysis  Subjective:    No complaints today.  Seen on dialysis.  Some hypokalemia today being replaced..   Objective:   BP (!) 178/93 (BP Location: Left Arm)   Pulse 75   Temp 98.1 F (36.7 C) (Oral)   Resp 20   Ht 5\' 3"  (1.6 m)   Wt 65.8 kg   SpO2 100%   BMI 25.69 kg/m   Intake/Output Summary (Last 24 hours) at 11/13/2019 1256 Last data filed at 11/13/2019 1251 Gross per 24 hour  Intake 1048.2 ml  Output 5435 ml  Net -4386.8 ml   Weight change: -3.2 kg  Physical  Exam: Gen: NAD, lying in bed, on dialysis CVS: normal rate Resp: Bilateral chest rise, no increased work of breathing Abd: +BS, soft, NT/ND Ext:2+ pitting edema on the left lower extremity, 1+ in the right lower extremity Access: rt BBFgood bruit, RIJ TC  Imaging: No results found.  Labs: BMET Recent Labs  Lab 11/07/19 0706 11/08/19 0607 11/09/19 0454 11/10/19 0550 11/11/19 0536 11/12/19 0354 11/13/19 0733  NA 135 135 135 134* 135 133* 134*  K 3.8 3.5 3.4* 3.1* 3.4* 3.5 3.1*  CL 100 100 98 95* 97* 98 96*  CO2 26 24 26 25 26 26 27   GLUCOSE 121* 100* 117* 192* 119* 218* 102*  BUN 15 22* 31* 39* 44* 24* 33*  CREATININE 2.60* 3.20* 3.50* 3.80* 4.16* 2.66* 3.22*  CALCIUM 8.1* 8.4* 8.4* 8.6* 8.7* 8.0* 8.4*  PHOS 3.1 3.8 4.4 4.8* 5.1* 3.5 4.3   CBC Recent Labs  Lab 11/09/19 0454 11/11/19 0536 11/12/19 0354 11/13/19 0733  WBC 12.0* 9.2 8.5 7.5  HGB 9.2* 8.9* 8.6* 9.1*  HCT 28.3* 27.9* 27.6* 28.5*  MCV 93.1 93.3 94.8 94.4  PLT 228 227 161 192    Medications:    . brimonidine  1 drop Left Eye BID  . calcium acetate  1,334 mg Oral TID WC  . carvedilol  12.5 mg Oral BID WC  . Chlorhexidine Gluconate Cloth  6 each Topical Q0600  . darbepoetin (ARANESP) injection -  NON-DIALYSIS  100 mcg Subcutaneous Q Tue-1800  . dorzolamide-timolol  1 drop Left Eye BID  . gabapentin  100 mg Oral Daily  . gatifloxacin  1 drop Left Eye QID  . insulin aspart  0-9 Units Subcutaneous TID WC  . insulin aspart  2 Units Subcutaneous TID WC  . latanoprost  1 drop Left Eye QHS  . losartan  100 mg Oral Daily  . potassium chloride  20 mEq Oral BID  . senna-docusate  1 tablet Oral BID  . torsemide  100 mg Oral Daily

## 2019-11-13 NOTE — Progress Notes (Signed)
Patient Currently in HD

## 2019-11-13 NOTE — Progress Notes (Addendum)
PROGRESS NOTE    Christopher Davila  GOT:[id number] DOB: 1970/09/27 DOA: 10/13/2019 PCP: Marliss Coots, NP   Brief Narrative: 49 year old with past medical history significant for hypertension, diabetes, gastroparesis, blindness, CKD stage IV, chronic diastolic heart failure admitted with diarrhea, weakness and failure to thrive.  He was recently admitted on 4/10--12 for acute on chronic diastolic heart failure.   He presented on 4/28 and was found to have severe metab acidosis, received bicarb IV fluid and was admitted.  He was having diarrhea and has since then resolved.  C diff and GI pathogen was negative.  Patient subsequently developed progressive fluid overload with lower extremity edema and acute on chronic diastolic heart failure.  He was a started on Lasix drip and metolazone but remain fluid overloaded with oliguria and uremic symptoms.  Patient had TDC and started hemodialysis on 10/28/2019.  Patient had right arm AV graft on 14.  Hemodialysis is switched from M,W,F to TTS due to census.  Patient is still volume overload. Patient develops urethral bleed after foley catheter was removed. Patient hb drop to 6. He received 2 units PRBC. Received DDAVP. Foley catheter was placed again. Urology consulted, hematuria resolved, catheter removed, Dispo is challenging, limited support and blind   Assessment & Plan:   Acute Hypoxic Respiratory Failure  Acute on Chronic Diastolic Heart Failure Exacerbation:  Progressive CKD 4 with volume overload: -Improving, -Volume status is improving with extra fluid removal on HD -Remains on high-dose diuretics as well -Attempt to wean off O2, on 5 L this morning, discussed with staff today -Continue HD per renal -Disposition remains challenging and unsafe, patient was previously homeless, blind, recently was staying with a cousin who was helping him, however she works 2 jobs and is unable to assist him during the day, patient is not physically  independent  AKI on CKD stage IV progressing to end-stage renal disease oliguria now new dialysis Right arm aVF on 5/14 need to follow-up with [facility] in 6 weeks. TDC and started on hemodialysis on 5/13 -HD today, see discussion above  Acute blood loss anemia; and anemia of chronic renal diseases.  Urethral bleeding Hb down to 6. S/P Transfused 2 units PRBC 5/22 -Hemoglobin now stable around 9 range Appreciate Dr Ralene Muskrat input -Hematuria was secondary to Foley trauma after catheter removal, Foley catheter was replaced, urine cleared up, Foley catheter was removed 5/27  Anion gap metabolic acidosis due to renal failure:  -Resolved.  Hyperphosphatemia: Improved  Acute metabolic encephalopathy:  -Secondary to uremia Still with confusion and hallucination at times. ammonia mildly elevated (38). Received a dose of lactulose. Ammonia decreased to 37  Hypothermia: Resolved.  Diabetes type 2: Continue with low-dose Lantus and sliding scale insulin -CBGs more stable now, continue current dose of Lantus and meal coverage  Hypertension;  -Started on low-dose carvedilol  Hyperlipidemia Continue with statins.  Diarrhea Resolved  0Estimated body mass index is 27.99 kg/m as calculated from the following:   Height as of this encounter: 5\' 3"  (1.6 m).   Weight as of this encounter: 71.7 kg.   DVT prophylaxis: Heparin Code Status: Full code Family Communication: No family at bedside.,  Called and updated cousin [name] 5/26 and 5/28 Disposition Plan:  Status is: Inpatient  Remains inpatient appropriate because ; fluid overloaded, hypoxic, unsafe discharge   Dispo: The patient is from: Home              Anticipated d/c is to: needs SNF, is blind unable to stand up or  ambulate without assistance, was previously homeless, recently started staying with a cousin who works 2 jobs and is unable to care for him, no safe discharge plan at this time              Anticipated d/c date is: To be  determined              Patient currently is not medically stable to d/c and no safe DC plan     Consultants:   Nephrology  IR  PMT  Vascular  Procedures:   Hemodialysis  Antimicrobials:  None  Subjective: -Feels better, breathing starting to improve, abdominal wall tightness is also better, on 5 L of O2 this morning Objective: Vitals:   11/13/19 0800 11/13/19 0830 11/13/19 0900 11/13/19 0930  BP: (!) 174/102 (!) 164/111 (!) 179/108 (!) 169/105  Pulse: 76 75 74 74  Resp: (!) 23 (!) 21  20  Temp:      TempSrc:      SpO2:      Weight:      Height:        Intake/Output Summary (Last 24 hours) at 11/13/2019 1021 Last data filed at 11/13/2019 0600 Gross per 24 hour  Intake 828.2 ml  Output 1125 ml  Net -296.8 ml   Filed Weights   11/12/19 0248 11/13/19 0319 11/13/19 0710  Weight: 69.9 kg 69.4 kg 71.7 kg    Examination:  General: Chronically ill blind male seen on dialysis, AAO x2, no distress HEENT: No JVD CVS S1-S2 regular rate rhythm Lungs with bilateral fine rales and diminished breath sounds Abdomen is soft nontender, improving abdominal wall edema Extremities with 1+ edema   Data Reviewed: I have personally reviewed following labs and imaging studies  CBC: Recent Labs  Lab 11/08/19 0607 11/09/19 0454 11/11/19 0536 11/12/19 0354 11/13/19 0733  WBC 10.9* 12.0* 9.2 8.5 7.5  HGB 9.3* 9.2* 8.9* 8.6* 9.1*  HCT 29.1* 28.3* 27.9* 27.6* 28.5*  MCV 93.0 93.1 93.3 94.8 94.4  PLT 223 228 227 161 235   Basic Metabolic Panel: Recent Labs  Lab 11/09/19 0454 11/10/19 0550 11/11/19 0536 11/12/19 0354 11/13/19 0733  NA 135 134* 135 133* 134*  K 3.4* 3.1* 3.4* 3.5 3.1*  CL 98 95* 97* 98 96*  CO2 26 25 26 26 27   GLUCOSE 117* 192* 119* 218* 102*  BUN 31* 39* 44* 24* 33*  CREATININE 3.50* 3.80* 4.16* 2.66* 3.22*  CALCIUM 8.4* 8.6* 8.7* 8.0* 8.4*  PHOS 4.4 4.8* 5.1* 3.5 4.3   GFR: Estimated Creatinine Clearance: 25.2 mL/min (A) (by C-G formula  based on SCr of 3.22 mg/dL (H)). Liver Function Tests: Recent Labs  Lab 11/09/19 0454 11/10/19 0550 11/11/19 0536 11/12/19 0354 11/13/19 0733  ALBUMIN 2.5* 2.4* 2.5* 2.4* 2.5*   No results for input(s): LIPASE, AMYLASE in the last 168 hours. Recent Labs  Lab 11/09/19 1537  AMMONIA 47*   Coagulation Profile: No results for input(s): INR, PROTIME in the last 168 hours. Cardiac Enzymes: No results for input(s): CKTOTAL, CKMB, CKMBINDEX, TROPONINI in the last 168 hours. BNP (last 3 results) No results for input(s): PROBNP in the last 8760 hours. HbA1C: No results for input(s): HGBA1C in the last 72 hours. CBG: Recent Labs  Lab 11/12/19 0615 11/12/19 1101 11/12/19 1614 11/12/19 2121 11/13/19 0529  GLUCAP 200* 150* 203* 153* 181*   Lipid Profile: No results for input(s): CHOL, HDL, LDLCALC, TRIG, CHOLHDL, LDLDIRECT in the last 72 hours. Thyroid Function Tests: No results for  input(s): TSH, T4TOTAL, FREET4, T3FREE, THYROIDAB in the last 72 hours. Anemia Panel: No results for input(s): VITAMINB12, FOLATE, FERRITIN, TIBC, IRON, RETICCTPCT in the last 72 hours. Sepsis Labs: No results for input(s): PROCALCITON, LATICACIDVEN in the last 168 hours.  No results found for this or any previous visit (from the past 240 hour(s)).       Radiology Studies: No results found.      Scheduled Meds: . brimonidine  1 drop Left Eye BID  . calcium acetate  1,334 mg Oral TID WC  . carvedilol  12.5 mg Oral BID WC  . Chlorhexidine Gluconate Cloth  6 each Topical Q0600  . darbepoetin (ARANESP) injection - NON-DIALYSIS  100 mcg Subcutaneous Q Tue-1800  . dorzolamide-timolol  1 drop Left Eye BID  . gabapentin  100 mg Oral Daily  . gatifloxacin  1 drop Left Eye QID  . insulin aspart  0-9 Units Subcutaneous TID WC  . insulin aspart  2 Units Subcutaneous TID WC  . latanoprost  1 drop Left Eye QHS  . losartan  100 mg Oral Daily  . senna-docusate  1 tablet Oral BID  . torsemide  100  mg Oral Daily   Continuous Infusions: . sodium chloride 250 mL (11/06/19 1453)  . sodium chloride    . sodium chloride       LOS: 29 days    Time spent: 25 minutes   Domenic Polite, MD [facility]   11/13/2019, 10:21 AM

## 2019-11-14 LAB — RENAL FUNCTION PANEL
Albumin: 2.5 g/dL — ABNORMAL LOW (ref 3.5–5.0)
Anion gap: 8 (ref 5–15)
BUN: 22 mg/dL — ABNORMAL HIGH (ref 6–20)
CO2: 27 mmol/L (ref 22–32)
Calcium: 8.5 mg/dL — ABNORMAL LOW (ref 8.9–10.3)
Chloride: 100 mmol/L (ref 98–111)
Creatinine, Ser: 2.42 mg/dL — ABNORMAL HIGH (ref 0.61–1.24)
GFR calc Af Amer: 36 mL/min — ABNORMAL LOW (ref >60)
GFR calc non Af Amer: 31 mL/min — ABNORMAL LOW (ref >60)
Glucose, Bld: 106 mg/dL — ABNORMAL HIGH (ref 70–99)
Phosphorus: 3.4 mg/dL (ref 2.5–4.6)
Potassium: 4 mmol/L (ref 3.5–5.1)
Sodium: 135 mmol/L (ref 135–145)

## 2019-11-14 LAB — GLUCOSE, CAPILLARY
Glucose-Capillary: 93 mg/dL (ref 70–99)
Glucose-Capillary: 174 mg/dL — ABNORMAL HIGH (ref 70–99)
Glucose-Capillary: 185 mg/dL — ABNORMAL HIGH (ref 70–99)
Glucose-Capillary: 192 mg/dL — ABNORMAL HIGH (ref 70–99)

## 2019-11-14 MED ORDER — AMLODIPINE BESYLATE 5 MG PO TABS
5.0000 mg | ORAL_TABLET | Freq: Every day | ORAL | Status: DC
  Administered 2019-11-14: 5 mg via ORAL
  Filled 2019-11-14: qty 1

## 2019-11-14 MED ORDER — AMLODIPINE BESYLATE 10 MG PO TABS
10.0000 mg | ORAL_TABLET | Freq: Every day | ORAL | Status: DC
  Administered 2019-11-15 – 2019-11-19 (×5): 10 mg via ORAL
  Filled 2019-11-14 (×5): qty 1

## 2019-11-14 MED ORDER — CARVEDILOL 25 MG PO TABS
25.0000 mg | ORAL_TABLET | Freq: Two times a day (BID) | ORAL | Status: DC
  Administered 2019-11-14 – 2019-11-19 (×10): 25 mg via ORAL
  Filled 2019-11-14 (×11): qty 1

## 2019-11-14 MED ORDER — CALCIUM ACETATE (PHOS BINDER) 667 MG PO CAPS
667.0000 mg | ORAL_CAPSULE | Freq: Three times a day (TID) | ORAL | Status: DC
  Administered 2019-11-14 – 2019-11-19 (×12): 667 mg via ORAL
  Filled 2019-11-14 (×12): qty 1

## 2019-11-14 NOTE — Plan of Care (Signed)
  Problem: Pain Managment: Goal: General experience of comfort will improve Outcome: Completed/Met   Problem: Safety: Goal: Ability to remain free from injury will improve Outcome: Completed/Met   Problem: Nutritional: Goal: Ability to make healthy dietary choices will improve Outcome: Completed/Met

## 2019-11-14 NOTE — Progress Notes (Signed)
[facility] Progress Note   New start.  Assessment/ Plan:   AKI/CKD stage IV- now progressed to ESRD in setting of acute on chronic diastolic CHF and diuresis.  -Dialysis Access:RIJTDC on 5/13.1st stage BBF placed5/14/2021. Plan to follow-up with vascular in 6 weeks.   - HD TTS 4hr, 4K/2.5Ca, 3-4L UF as tolerated, no heparin - CLIPped to [facility] TTS, making arrangements for transportation and home assistance. 2. Acute on chronic diastolic CHF-.  Continue on torsemide 100 daily and ultrafiltration with dialysis 3. Urinary retention complicated by hematuria after foley thought to be due to urethral stricture.  Urology has followed.  No bleeding at this time.  No signs of urinary retention. 4. Anemia of CKD stage IV- hgbdropped to 6.8 and received a unit of PRBC's at that time and has risen to 9's.Received 2 doses IV feraheme on 5/1 and Aranesp 40 mcg on 4/30. ESA increased to 126mcg on 5/10 and 5/18. 5/28/ Hb 8.6, CTM.  5. DM type 2 with hyperglycemia - per primary svc 6. Bones-phosphorus is improving with dialysis. Phosphorus acceptable and will decrease PhosLo to 1 tablet 3 times a day. - PTH 172;  7. Hypertension: Elevated recently.  Carvedilol increased to 25 mg twice daily and losartan increased to 100 mg daily.  Amlodipine 5 mg daily started today increased to 10 mg starting tomorrow..  Continue ultrafiltration with dialysis and consider increasing medications as needed 8. Hypokalemia : Present on 5/29.  Now resolved.  Subjective:    No complaints today.  Tolerated dialysis without any issues.  4.2 L of ultrafiltration 1 L of urine output.   Objective:   BP (!) 185/102 (BP Location: Left Arm)   Pulse 74   Temp 98.2 F (36.8 C) (Oral)   Resp 20   Ht 5\' 3"  (1.6 m)   Wt 66.2 kg   SpO2 98%   BMI 25.86 kg/m   Intake/Output Summary (Last 24 hours) at 11/14/2019 1421 Last data filed at 11/14/2019 1300 Gross per 24 hour  Intake 700 ml  Output 625 ml   Net 75 ml   Weight change: 2.268 kg  Physical Exam: Gen: NAD, lying in bed, no distress CVS: normal rate Resp: Bilateral chest rise, no increased work of breathing Abd: +BS, soft, NT/ND Ext:1+ pitting edema in the bilateral lower extremities Access: rt BBFgood bruit, RIJ TC  Imaging: No results found.  Labs: BMET Recent Labs  Lab 11/08/19 [phone] 11/09/19 0454 11/10/19 0550 11/11/19 0536 11/12/19 0354 11/13/19 0733 11/14/19 0616  NA 135 135 134* 135 133* 134* 135  K 3.5 3.4* 3.1* 3.4* 3.5 3.1* 4.0  CL 100 98 95* 97* 98 96* 100  CO2 24 26 25 26 26 27 27   GLUCOSE 100* 117* 192* 119* 218* 102* 106*  BUN 22* 31* 39* 44* 24* 33* 22*  CREATININE 3.20* 3.50* 3.80* 4.16* 2.66* 3.22* 2.42*  CALCIUM 8.4* 8.4* 8.6* 8.7* 8.0* 8.4* 8.5*  PHOS 3.8 4.4 4.8* 5.1* 3.5 4.3 3.4   CBC Recent Labs  Lab 11/09/19 0454 11/11/19 0536 11/12/19 0354 11/13/19 0733  WBC 12.0* 9.2 8.5 7.5  HGB 9.2* 8.9* 8.6* 9.1*  HCT 28.3* 27.9* 27.6* 28.5*  MCV 93.1 93.3 94.8 94.4  PLT 228 227 161 192    Medications:    . [START ON 11/15/2019] amLODipine  10 mg Oral Daily  . brimonidine  1 drop Left Eye BID  . calcium acetate  1,334 mg Oral TID WC  . carvedilol  25 mg Oral BID  WC  . Chlorhexidine Gluconate Cloth  6 each Topical Q0600  . darbepoetin (ARANESP) injection - NON-DIALYSIS  100 mcg Subcutaneous Q Tue-1800  . dorzolamide-timolol  1 drop Left Eye BID  . gabapentin  100 mg Oral Daily  . gatifloxacin  1 drop Left Eye QID  . insulin aspart  0-9 Units Subcutaneous TID WC  . insulin aspart  2 Units Subcutaneous TID WC  . latanoprost  1 drop Left Eye QHS  . losartan  100 mg Oral Daily  . senna-docusate  1 tablet Oral BID  . torsemide  100 mg Oral Daily

## 2019-11-14 NOTE — Progress Notes (Signed)
Pt has been drowsy and irritable most of the day, Vitals stable, Medicated over night and there is a  significant drop in blood pressures with in normal limits See Mar for adjustments in medication treatment.

## 2019-11-14 NOTE — Progress Notes (Signed)
PROGRESS NOTE    Christopher Davila  YQM:[id number] DOB: [birth year] DOA: 10/13/2019 PCP: Marliss Coots, NP   Brief Narrative: 49 year old with past medical history significant for hypertension, diabetes, gastroparesis, blindness, CKD stage IV, chronic diastolic heart failure admitted with diarrhea, weakness and failure to thrive.  He was recently admitted on 4/10--12 for acute on chronic diastolic heart failure.   He presented on 4/28 and was found to have AKI, severe metab acidosis, received bicarb IV fluid and was admitted.  He was having diarrhea and has since then resolved. Patient subsequently developed progressive fluid overload with lower extremity edema and acute on chronic diastolic heart failure.  He was a started on Lasix drip and metolazone but remain fluid overloaded with oliguria and uremic symptoms.  Patient had TDC and started hemodialysis on 10/28/2019.  Patient had right arm AV graft on 14.Patient developed urethral bleed after foley catheter was removed, hemoglobin dropped to 6, requiring 2 units of PRBC and DDAVP. Foley catheter was placed again. Urology consulted, hematuria resolved, catheter removed, Dispo is challenging, limited support and blind   Assessment & Plan:   Acute Hypoxic Respiratory Failure  Acute on Chronic Diastolic Heart Failure Exacerbation:  Progressive CKD 4 with volume overload: -Improving, -Volume status is improving with extra fluid removal on HD -Remains on high-dose diuretics as well -Attempt to wean off O2 today, HD per renal -Disposition remains challenging and unsafe, patient was previously homeless, blind, recently was staying with a cousin who was helping him, however she works 2 jobs and is unable to assist him during the day, patient is not physically independent  AKI on CKD stage IV progressing to end-stage renal disease oliguria now new dialysis Right arm aVF on 5/14 need to follow-up with [facility] in 6 weeks. TDC and started on hemodialysis on  5/13 -HD yesterday, see discussion above  Acute blood loss anemia; and anemia of chronic renal diseases.  Urethral bleeding Hb down to 6. S/P Transfused 2 units PRBC 5/22 -Hemoglobin now stable around 9 range Appreciate Dr Ralene Muskrat input -Hematuria was secondary to Foley trauma after catheter removal, Foley catheter was replaced, urine cleared up, Foley catheter was removed 5/27  Anion gap metabolic acidosis due to renal failure:  -Resolved.  Hyperphosphatemia: Improved  Acute metabolic encephalopathy:  -Secondary to uremia -Mental status has improved  Hypothermia: Resolved.  Diabetes type 2:  -CBGs more stable now, continue current dose of Lantus and meal coverage  Hypertension; uncontrolled -Started on low-dose carvedilol, continue this -Increase amlodipine dose to 10 mg  Hyperlipidemia  -Continue statins.  Diarrhea Resolved  0Estimated body mass index is 25.86 kg/m as calculated from the following:   Height as of this encounter: 5\' 3"  (1.6 m).   Weight as of this encounter: 66.2 kg.   DVT prophylaxis: Heparin Code Status: Full code Family Communication: No family at bedside.,  Called and updated cousin [name] 5/26 and 5/28 Disposition Plan:  Status is: Inpatient  Remains inpatient appropriate because of unsafe disposition   Dispo: The patient is from: Home              Anticipated d/c is to: needs SNF, is blind unable to stand up or ambulate without assistance, was previously homeless, recently started staying with a cousin who works 2 jobs and is unable to care for him, no safe discharge plan at this time              Anticipated d/c date is: To be determined  Patient currently is medically stable to d/c but no safe DC plan     Consultants:   Nephrology  IR  PMT  Vascular  Procedures:   Hemodialysis  Antimicrobials:  None  Subjective: -Breathing better, oxygen down to 2 L this morning, abdominal wall tightness and swelling is  also improving Objective: Vitals:   11/13/19 1952 11/14/19 0005 11/14/19 0515 11/14/19 0749  BP: (!) 173/97  (!) 166/97 (!) 185/102  Pulse: 77  76 74  Resp: 20  20 20   Temp: 98.2 F (36.8 C)  97.9 F (36.6 C) 98.2 F (36.8 C)  TempSrc: Oral  Oral Oral  SpO2: 100%  99% 98%  Weight:  66.2 kg    Height:        Intake/Output Summary (Last 24 hours) at 11/14/2019 1112 Last data filed at 11/14/2019 0523 Gross per 24 hour  Intake 680 ml  Output 5235 ml  Net -4555 ml   Filed Weights   11/13/19 0710 11/13/19 1115 11/14/19 0005  Weight: 71.7 kg 65.8 kg 66.2 kg    Examination:  General: Pleasant chronically ill male sitting up in bed, eyes closed trying to feed himself breakfast Awake alert oriented x2, no distress HEENT: No JVD CVS S1-S2 regular rate rhythm Lungs with diminished breath sounds at both bases Abdomen is soft nontender, abdomen improving abdominal wall edema Extremities with trace edema    Data Reviewed: I have personally reviewed following labs and imaging studies  CBC: Recent Labs  Lab 11/08/19 0607 11/09/19 0454 11/11/19 0536 11/12/19 0354 11/13/19 0733  WBC 10.9* 12.0* 9.2 8.5 7.5  HGB 9.3* 9.2* 8.9* 8.6* 9.1*  HCT 29.1* 28.3* 27.9* 27.6* 28.5*  MCV 93.0 93.1 93.3 94.8 94.4  PLT 223 228 227 161 299   Basic Metabolic Panel: Recent Labs  Lab 11/10/19 0550 11/11/19 0536 11/12/19 0354 11/13/19 0733 11/14/19 0616  NA 134* 135 133* 134* 135  K 3.1* 3.4* 3.5 3.1* 4.0  CL 95* 97* 98 96* 100  CO2 25 26 26 27 27   GLUCOSE 192* 119* 218* 102* 106*  BUN 39* 44* 24* 33* 22*  CREATININE 3.80* 4.16* 2.66* 3.22* 2.42*  CALCIUM 8.6* 8.7* 8.0* 8.4* 8.5*  PHOS 4.8* 5.1* 3.5 4.3 3.4   GFR: Estimated Creatinine Clearance: 30.4 mL/min (A) (by C-G formula based on SCr of 2.42 mg/dL (H)). Liver Function Tests: Recent Labs  Lab 11/10/19 0550 11/11/19 0536 11/12/19 0354 11/13/19 0733 11/14/19 0616  ALBUMIN 2.4* 2.5* 2.4* 2.5* 2.5*   No results for  input(s): LIPASE, AMYLASE in the last 168 hours. Recent Labs  Lab 11/09/19 1537  AMMONIA 47*   Coagulation Profile: No results for input(s): INR, PROTIME in the last 168 hours. Cardiac Enzymes: No results for input(s): CKTOTAL, CKMB, CKMBINDEX, TROPONINI in the last 168 hours. BNP (last 3 results) No results for input(s): PROBNP in the last 8760 hours. HbA1C: No results for input(s): HGBA1C in the last 72 hours. CBG: Recent Labs  Lab 11/13/19 0529 11/13/19 1214 11/13/19 1616 11/13/19 2103 11/14/19 0607  GLUCAP 181* 108* 159* 104* 93   Lipid Profile: No results for input(s): CHOL, HDL, LDLCALC, TRIG, CHOLHDL, LDLDIRECT in the last 72 hours. Thyroid Function Tests: No results for input(s): TSH, T4TOTAL, FREET4, T3FREE, THYROIDAB in the last 72 hours. Anemia Panel: No results for input(s): VITAMINB12, FOLATE, FERRITIN, TIBC, IRON, RETICCTPCT in the last 72 hours. Sepsis Labs: No results for input(s): PROCALCITON, LATICACIDVEN in the last 168 hours.  No results found for this or  any previous visit (from the past 240 hour(s)).       Radiology Studies: No results found.      Scheduled Meds: . amLODipine  5 mg Oral Daily  . brimonidine  1 drop Left Eye BID  . calcium acetate  1,334 mg Oral TID WC  . carvedilol  25 mg Oral BID WC  . Chlorhexidine Gluconate Cloth  6 each Topical Q0600  . darbepoetin (ARANESP) injection - NON-DIALYSIS  100 mcg Subcutaneous Q Tue-1800  . dorzolamide-timolol  1 drop Left Eye BID  . gabapentin  100 mg Oral Daily  . gatifloxacin  1 drop Left Eye QID  . insulin aspart  0-9 Units Subcutaneous TID WC  . insulin aspart  2 Units Subcutaneous TID WC  . latanoprost  1 drop Left Eye QHS  . losartan  100 mg Oral Daily  . senna-docusate  1 tablet Oral BID  . torsemide  100 mg Oral Daily   Continuous Infusions: . sodium chloride 250 mL (11/06/19 1453)     LOS: 30 days    Time spent: 25 minutes   Domenic Polite, MD [facility]   11/14/2019, 11:12 AM

## 2019-11-15 LAB — RENAL FUNCTION PANEL
Albumin: 2.6 g/dL — ABNORMAL LOW (ref 3.5–5.0)
Anion gap: 11 (ref 5–15)
BUN: 30 mg/dL — ABNORMAL HIGH (ref 6–20)
CO2: 26 mmol/L (ref 22–32)
Calcium: 8.5 mg/dL — ABNORMAL LOW (ref 8.9–10.3)
Chloride: 98 mmol/L (ref 98–111)
Creatinine, Ser: 3.25 mg/dL — ABNORMAL HIGH (ref 0.61–1.24)
GFR calc Af Amer: 25 mL/min — ABNORMAL LOW (ref >60)
GFR calc non Af Amer: 21 mL/min — ABNORMAL LOW (ref >60)
Glucose, Bld: 165 mg/dL — ABNORMAL HIGH (ref 70–99)
Phosphorus: 4.8 mg/dL — ABNORMAL HIGH (ref 2.5–4.6)
Potassium: 3.7 mmol/L (ref 3.5–5.1)
Sodium: 135 mmol/L (ref 135–145)

## 2019-11-15 LAB — GLUCOSE, CAPILLARY
Glucose-Capillary: 129 mg/dL — ABNORMAL HIGH (ref 70–99)
Glucose-Capillary: 173 mg/dL — ABNORMAL HIGH (ref 70–99)
Glucose-Capillary: 158 mg/dL — ABNORMAL HIGH (ref 70–99)
Glucose-Capillary: 185 mg/dL — ABNORMAL HIGH (ref 70–99)

## 2019-11-15 MED ORDER — HYDROCODONE-ACETAMINOPHEN 5-325 MG PO TABS
1.0000 | ORAL_TABLET | Freq: Four times a day (QID) | ORAL | Status: DC | PRN
  Administered 2019-11-15 – 2019-11-16 (×2): 1 via ORAL
  Administered 2019-11-16 (×2): 2 via ORAL
  Administered 2019-11-17: 1 via ORAL
  Administered 2019-11-17: 2 via ORAL
  Administered 2019-11-18: 1 via ORAL
  Administered 2019-11-19: 2 via ORAL
  Filled 2019-11-15: qty 2
  Filled 2019-11-15 (×2): qty 1
  Filled 2019-11-15: qty 2
  Filled 2019-11-15: qty 1
  Filled 2019-11-15 (×3): qty 2

## 2019-11-15 MED ORDER — CHLORHEXIDINE GLUCONATE CLOTH 2 % EX PADS
6.0000 | MEDICATED_PAD | Freq: Every day | CUTANEOUS | Status: DC
  Administered 2019-11-16 – 2019-11-17 (×2): 6 via TOPICAL

## 2019-11-15 MED ORDER — DIPHENHYDRAMINE HCL 25 MG PO CAPS
25.0000 mg | ORAL_CAPSULE | Freq: Four times a day (QID) | ORAL | Status: DC | PRN
  Administered 2019-11-15 – 2019-11-18 (×5): 25 mg via ORAL
  Filled 2019-11-15 (×5): qty 1

## 2019-11-15 NOTE — Progress Notes (Signed)
[facility] Progress Note  Subjective: seen in room, nasal O2, no SOB  Vitals:   11/14/19 1738 11/14/19 1958 11/15/19 0335 11/15/19 1117  BP: 139/86 (!) 148/87 (!) 176/103 (!) 149/88  Pulse: 70 73 73 74  Resp: 20 20 20 18   Temp: (!) 97.5 F (36.4 C) (!) 97.5 F (36.4 C) 98.2 F (36.8 C) 98.3 F (36.8 C)  TempSrc: Oral Oral Oral Oral  SpO2: 90% 95% 96% 93%  Weight:   61.7 kg   Height:        Exam:  Gen: NAD, lying in bed, no distress CVS: normal rate Resp: Bilateral chest rise, no increased work of breathing Abd: +BS, soft, NT/ND Ext:1+ pitting edema in the bilateral lower extremities Access: Rt BBFgood bruit, RIJ TC    OP HD: new esrd pt, getting TTS HD here    CXR 5/27 - persistent pulm edema bilat   Assessment/ Plan: 1. AoCKD4 - now progressed to esrd, new start to dialysis. RIJ TDC on 5/13 and 1st stage BBF 10/29/19.  CLIP'd to [place] [facility] but awaiting issues of insurance/ home assistance/ transportation.  HD TTS here, HD tomorrow.  2. Vol overload - 10kg off last 2 sessions, wt's are down significantly. Still a bit edematous on exam but resp status improved. Cont to lower vol. 3. Urinary retention complicated by hematuria after foley thought to be due to urethral stricture.  Urology has followed.  No bleeding at this time.  4. Anemia of CKD stage IV- hgbdropped to 6.8 and received 1u PRBC's at that time and has risen to 9's.Received 2 doses IV feraheme on 5/1 and Aranesp 40 mcg on 4/30. ESA increased to 143mcg on 5/10 and 5/18 5. DM type 2 with hyperglycemia - per primary svc 6. Bones-phosphorus is improving with dialysis. Phosphorus acceptable and will decrease PhosLo to 1 tablet 3 times a day. - PTH 172;  7. Hypertension: Elevated recently.  Carvedilol increased to 25 mg twice daily and losartan increased to 100 mg daily.  Amlodipine added here. Continue ultrafiltration with dialysis and consider increasing medications as needed     Rob  Ravin Bendall 11/15/2019, 11:51 AM   Recent Labs  Lab 11/12/19 0354 11/12/19 0354 11/13/19 0733 11/13/19 0733 11/14/19 0616 11/15/19 0504  K 3.5   < > 3.1*   < > 4.0 3.7  BUN 24*   < > 33*   < > 22* 30*  CREATININE 2.66*   < > 3.22*   < > 2.42* 3.25*  CALCIUM 8.0*   < > 8.4*   < > 8.5* 8.5*  PHOS 3.5   < > 4.3   < > 3.4 4.8*  HGB 8.6*  --  9.1*  --   --   --    < > = values in this interval not displayed.   Inpatient medications: . amLODipine  10 mg Oral Daily  . brimonidine  1 drop Left Eye BID  . calcium acetate  667 mg Oral TID WC  . carvedilol  25 mg Oral BID WC  . Chlorhexidine Gluconate Cloth  6 each Topical Q0600  . darbepoetin (ARANESP) injection - NON-DIALYSIS  100 mcg Subcutaneous Q Tue-1800  . dorzolamide-timolol  1 drop Left Eye BID  . gabapentin  100 mg Oral Daily  . gatifloxacin  1 drop Left Eye QID  . insulin aspart  0-9 Units Subcutaneous TID WC  . insulin aspart  2 Units Subcutaneous TID WC  . latanoprost  1 drop Left  Eye QHS  . losartan  100 mg Oral Daily  . senna-docusate  1 tablet Oral BID  . torsemide  100 mg Oral Daily   . sodium chloride 250 mL (11/06/19 1453)   sodium chloride, acetaminophen **OR** acetaminophen, hydrALAZINE, lidocaine, ondansetron (ZOFRAN) IV, sodium chloride irrigation, traZODone

## 2019-11-15 NOTE — Progress Notes (Signed)
Report received from previous RN. This RN will take over care. Patient stable, will continue to monitor throughout the shift.

## 2019-11-15 NOTE — Progress Notes (Signed)
PROGRESS NOTE    Christopher Davila  YSA:[id number] DOB: 04-09-1971 DOA: 10/13/2019 PCP: Marliss Coots, NP   Brief Narrative: 49 year old with past medical history significant for hypertension, diabetes, gastroparesis, blindness, CKD stage IV, chronic diastolic heart failure admitted with diarrhea, weakness and failure to thrive.  He was recently admitted on 4/10--12 for acute on chronic diastolic heart failure.   He presented on 4/28 and was found to have AKI, severe metab acidosis, received bicarb IV fluid and was admitted.  He was having diarrhea and has since then resolved. Patient subsequently developed progressive fluid overload with lower extremity edema and acute on chronic diastolic heart failure.  He was a started on Lasix drip and metolazone but remain fluid overloaded with oliguria and uremic symptoms.  Patient had TDC and started hemodialysis on 10/28/2019.  Patient had right arm AV graft on 14.Patient developed urethral bleed after foley catheter was removed, hemoglobin dropped to 6, requiring 2 units of PRBC and DDAVP. Foley catheter was placed again. Urology consulted, hematuria resolved, catheter removed, -Volume status improved Dispo is challenging, limited support and blind, unable to ambulate independently, no SNF benefits   Assessment & Plan:   Acute Hypoxic Respiratory Failure  Acute on Chronic Diastolic Heart Failure Exacerbation:  Progressive CKD 4 with volume overload: -Improving, -Volume status is improving with extra fluid removal on HD -Remains on high-dose torsemide as well -Attempt to wean off O2 today, HD tomorrow -Disposition remains challenging and unsafe, patient was recently homeless, blind, just started staying with a cousin who was helping him, however she works 2 jobs and is unable to assist him during the day, patient is not physically independent, social work and case management following  AKI on CKD stage IV progressing to end-stage renal disease oliguria  now new dialysis Right arm aVF on 5/14 need to follow-up with [facility] in 6 weeks. TDC and started on hemodialysis on 5/13 -HD tomorrow, see discussion above  Acute blood loss anemia; and anemia of chronic renal diseases.  Urethral bleeding Hb down to 6. S/P Transfused 2 units PRBC 5/22 -Hemoglobin now stable around 9 range Appreciate Dr Ralene Muskrat input -Hematuria was secondary to Foley trauma after catheter removal, Foley catheter was replaced, urine cleared up, Foley catheter was removed 5/27 -CBC in a.m.  Anion gap metabolic acidosis due to renal failure:  -Resolved.  Hyperphosphatemia: Improved  Acute metabolic encephalopathy:  -Secondary to uremia -Mental status has improved  Hypothermia: Resolved.  Diabetes type 2:  -CBGs more stable now, continue current dose of Lantus and meal coverage  Hypertension; uncontrolled -Started on low-dose carvedilol, continue this -Increase amlodipine dose to 10 mg  Hyperlipidemia  -Continue statins.  Diarrhea Resolved  0Estimated body mass index is 24.09 kg/m as calculated from the following:   Height as of this encounter: 5\' 3"  (1.6 m).   Weight as of this encounter: 61.7 kg.   DVT prophylaxis: Heparin Code Status: Full code Family Communication: No family at bedside.,  Called and updated cousin [name] 5/26 and 5/28 Disposition Plan:  Status is: Inpatient  Remains inpatient appropriate because of unsafe disposition   Dispo: The patient is from: Home              Anticipated d/c is to: needs SNF, is blind unable to stand up or ambulate without assistance, was previously homeless, recently started staying with a cousin who works 2 jobs and is unable to care for him, no safe discharge plan at this time  Anticipated d/c date is: To be determined              Patient currently is medically stable to d/c but no safe DC plan     Consultants:   Nephrology  IR  PMT  Vascular  Procedures:    Hemodialysis  Antimicrobials:  None  Subjective: -Feels a little better, breathing is improving, down to 1 to 2 L of O2 Objective: Vitals:   11/14/19 1703 11/14/19 1738 11/14/19 1958 11/15/19 0335  BP: 118/77 139/86 (!) 148/87 (!) 176/103  Pulse: 68 70 73 73  Resp:  20 20 20   Temp:  (!) 97.5 F (36.4 C) (!) 97.5 F (36.4 C) 98.2 F (36.8 C)  TempSrc:  Oral Oral Oral  SpO2: 97% 90% 95% 96%  Weight:    61.7 kg  Height:        Intake/Output Summary (Last 24 hours) at 11/15/2019 1055 Last data filed at 11/15/2019 0900 Gross per 24 hour  Intake 840 ml  Output 400 ml  Net 440 ml   Filed Weights   11/13/19 1115 11/14/19 0005 11/15/19 0335  Weight: 65.8 kg 66.2 kg 61.7 kg    Examination:  General: Pleasant blind male sitting up in bed, AAOx3, no distress HEENT: No JVD CVS S1-S2, regular rate rhythm Lungs with diminished breath sounds at both bases Abdomen is soft, nontender, improving abdominal wall edema Extremities trace edema   Data Reviewed: I have personally reviewed following labs and imaging studies  CBC: Recent Labs  Lab 11/09/19 0454 11/11/19 0536 11/12/19 0354 11/13/19 0733  WBC 12.0* 9.2 8.5 7.5  HGB 9.2* 8.9* 8.6* 9.1*  HCT 28.3* 27.9* 27.6* 28.5*  MCV 93.1 93.3 94.8 94.4  PLT 228 227 161 027   Basic Metabolic Panel: Recent Labs  Lab 11/11/19 0536 11/12/19 0354 11/13/19 0733 11/14/19 0616 11/15/19 0504  NA 135 133* 134* 135 135  K 3.4* 3.5 3.1* 4.0 3.7  CL 97* 98 96* 100 98  CO2 26 26 27 27 26   GLUCOSE 119* 218* 102* 106* 165*  BUN 44* 24* 33* 22* 30*  CREATININE 4.16* 2.66* 3.22* 2.42* 3.25*  CALCIUM 8.7* 8.0* 8.4* 8.5* 8.5*  PHOS 5.1* 3.5 4.3 3.4 4.8*   GFR: Estimated Creatinine Clearance: 22.6 mL/min (A) (by C-G formula based on SCr of 3.25 mg/dL (H)). Liver Function Tests: Recent Labs  Lab 11/11/19 0536 11/12/19 0354 11/13/19 0733 11/14/19 0616 11/15/19 0504  ALBUMIN 2.5* 2.4* 2.5* 2.5* 2.6*   No results for input(s):  LIPASE, AMYLASE in the last 168 hours. Recent Labs  Lab 11/09/19 1537  AMMONIA 47*   Coagulation Profile: No results for input(s): INR, PROTIME in the last 168 hours. Cardiac Enzymes: No results for input(s): CKTOTAL, CKMB, CKMBINDEX, TROPONINI in the last 168 hours. BNP (last 3 results) No results for input(s): PROBNP in the last 8760 hours. HbA1C: No results for input(s): HGBA1C in the last 72 hours. CBG: Recent Labs  Lab 11/14/19 0607 11/14/19 1135 11/14/19 1627 11/14/19 2109 11/15/19 0658  GLUCAP 93 174* 185* 192* 129*   Lipid Profile: No results for input(s): CHOL, HDL, LDLCALC, TRIG, CHOLHDL, LDLDIRECT in the last 72 hours. Thyroid Function Tests: No results for input(s): TSH, T4TOTAL, FREET4, T3FREE, THYROIDAB in the last 72 hours. Anemia Panel: No results for input(s): VITAMINB12, FOLATE, FERRITIN, TIBC, IRON, RETICCTPCT in the last 72 hours. Sepsis Labs: No results for input(s): PROCALCITON, LATICACIDVEN in the last 168 hours.  No results found for this or any previous  visit (from the past 240 hour(s)).       Radiology Studies: No results found.      Scheduled Meds:  amLODipine  10 mg Oral Daily   brimonidine  1 drop Left Eye BID   calcium acetate  667 mg Oral TID WC   carvedilol  25 mg Oral BID WC   Chlorhexidine Gluconate Cloth  6 each Topical Q0600   darbepoetin (ARANESP) injection - NON-DIALYSIS  100 mcg Subcutaneous Q Tue-1800   dorzolamide-timolol  1 drop Left Eye BID   gabapentin  100 mg Oral Daily   gatifloxacin  1 drop Left Eye QID   insulin aspart  0-9 Units Subcutaneous TID WC   insulin aspart  2 Units Subcutaneous TID WC   latanoprost  1 drop Left Eye QHS   losartan  100 mg Oral Daily   senna-docusate  1 tablet Oral BID   torsemide  100 mg Oral Daily   Continuous Infusions:  sodium chloride 250 mL (11/06/19 1453)     LOS: 31 days    Time spent: 25 minutes   Domenic Polite, MD [facility]   11/15/2019, 10:55 AM

## 2019-11-15 NOTE — Plan of Care (Signed)
  Problem: Education: Goal: Ability to demonstrate management of disease process will improve Outcome: Progressing   Problem: Cardiac: Goal: Ability to achieve and maintain adequate cardiopulmonary perfusion will improve Outcome: Progressing

## 2019-11-16 LAB — RENAL FUNCTION PANEL
Albumin: 2.5 g/dL — ABNORMAL LOW (ref 3.5–5.0)
Anion gap: 12 (ref 5–15)
BUN: 38 mg/dL — ABNORMAL HIGH (ref 6–20)
CO2: 26 mmol/L (ref 22–32)
Calcium: 8.7 mg/dL — ABNORMAL LOW (ref 8.9–10.3)
Chloride: 98 mmol/L (ref 98–111)
Creatinine, Ser: 3.86 mg/dL — ABNORMAL HIGH (ref 0.61–1.24)
GFR calc Af Amer: 20 mL/min — ABNORMAL LOW (ref >60)
GFR calc non Af Amer: 17 mL/min — ABNORMAL LOW (ref >60)
Glucose, Bld: 118 mg/dL — ABNORMAL HIGH (ref 70–99)
Phosphorus: 5.7 mg/dL — ABNORMAL HIGH (ref 2.5–4.6)
Potassium: 3.4 mmol/L — ABNORMAL LOW (ref 3.5–5.1)
Sodium: 136 mmol/L (ref 135–145)

## 2019-11-16 LAB — CBC
HCT: 30.2 % — ABNORMAL LOW (ref 39.0–52.0)
Hemoglobin: 9.5 g/dL — ABNORMAL LOW (ref 13.0–17.0)
MCH: 29.4 pg (ref 26.0–34.0)
MCHC: 31.5 g/dL (ref 30.0–36.0)
MCV: 93.5 fL (ref 80.0–100.0)
Platelets: 186 10*3/uL (ref 150–400)
RBC: 3.23 MIL/uL — ABNORMAL LOW (ref 4.22–5.81)
RDW: 15.8 % — ABNORMAL HIGH (ref 11.5–15.5)
WBC: 6.2 10*3/uL (ref 4.0–10.5)
nRBC: 0 % (ref 0.0–0.2)

## 2019-11-16 LAB — GLUCOSE, CAPILLARY
Glucose-Capillary: 124 mg/dL — ABNORMAL HIGH (ref 70–99)
Glucose-Capillary: 69 mg/dL — ABNORMAL LOW (ref 70–99)
Glucose-Capillary: 109 mg/dL — ABNORMAL HIGH (ref 70–99)
Glucose-Capillary: 147 mg/dL — ABNORMAL HIGH (ref 70–99)
Glucose-Capillary: 199 mg/dL — ABNORMAL HIGH (ref 70–99)

## 2019-11-16 MED ORDER — HEPARIN SODIUM (PORCINE) 1000 UNIT/ML IJ SOLN
INTRAMUSCULAR | Status: AC
  Administered 2019-11-16: 2500 [IU]
  Filled 2019-11-16: qty 3

## 2019-11-16 MED ORDER — HEPARIN SODIUM (PORCINE) 1000 UNIT/ML IJ SOLN
INTRAMUSCULAR | Status: AC
  Administered 2019-11-16: 3200 [IU]
  Filled 2019-11-16: qty 3

## 2019-11-16 NOTE — Progress Notes (Signed)
PROGRESS NOTE    Christopher Davila  FOY:[id number] DOB: May 30, 1971 DOA: 10/13/2019 PCP: Marliss Coots, NP   Brief Narrative: 49 year old with past medical history significant for hypertension, diabetes, gastroparesis, blindness, CKD stage IV, chronic diastolic heart failure admitted with diarrhea, weakness and failure to thrive.  He was recently admitted on 4/10--12 for acute on chronic diastolic heart failure.   He presented on 4/28 and was found to have AKI, severe metab acidosis, received bicarb IV fluid and was admitted.  He was having diarrhea and has since then resolved. Patient subsequently developed progressive fluid overload with lower extremity edema and acute on chronic diastolic heart failure.  He was a started on Lasix drip and metolazone but remain fluid overloaded with oliguria and uremic symptoms.  Patient had TDC and started hemodialysis on 10/28/2019.  Patient had right arm AV graft on 14.Patient developed urethral bleed after foley catheter was removed, hemoglobin dropped to 6, requiring 2 units of PRBC and DDAVP. Foley catheter was placed again. Urology consulted, hematuria resolved, catheter removed, -Volume status improved Dispo is challenging, limited support and blind, unable to ambulate independently, no SNF benefits   Assessment & Plan:   Acute Hypoxic Respiratory Failure  Acute on Chronic Diastolic Heart Failure Exacerbation:  Progressive CKD 4 with volume overload: -Improving, -Volume status is improving with extra fluid removal on HD -Remains on high-dose torsemide as well -Attempt to wean off O2 today, HD today -Disposition remains challenging and unsafe, patient was recently homeless, blind, just started staying with a cousin who was helping him, however she works 2 jobs and is unable to assist him during the day, patient is not physically independent, social work and case management following  AKI on CKD stage IV progressing to end-stage renal disease oliguria now  new dialysis Right arm aVF on 5/14 need to follow-up with [facility] in 6 weeks. TDC and started on hemodialysis on 5/13 -HD tomorrow, see discussion above  Acute blood loss anemia; and anemia of chronic renal diseases.  Urethral bleeding Hb down to 6. S/P Transfused 2 units PRBC 5/22 -Hemoglobin now stable around 9 range Appreciate Dr Ralene Muskrat input -Hematuria was secondary to Foley trauma after catheter removal, Foley catheter was replaced, urine cleared up, Foley catheter was removed 5/27 -Hemoglobin stable, now with 9.5, no further hematuria  Anion gap metabolic acidosis due to renal failure:  -Resolved.  Hyperphosphatemia: Improved  Acute metabolic encephalopathy:  -Secondary to uremia -Mental status has improved  Hypothermia: Resolved.  Diabetes type 2:  -CBGs more stable now, continue current dose of Lantus and meal coverage  Hypertension; uncontrolled -Started on low-dose carvedilol, continue this -Increase amlodipine dose to 10 mg  Hyperlipidemia  -Continue statins.  Diarrhea Resolved  0Estimated body mass index is 23.86 kg/m as calculated from the following:   Height as of this encounter: 5\' 3"  (1.6 m).   Weight as of this encounter: 61.1 kg.   DVT prophylaxis: Heparin Code Status: Full code Family Communication: No family at bedside.,  Called and updated cousin [name] 5/26 and 5/28 Disposition Plan:  Status is: Inpatient  Remains inpatient appropriate because of unsafe disposition   Dispo: The patient is from: Home              Anticipated d/c is to: needs SNF, is blind unable to stand up or ambulate without assistance, was previously homeless, recently started staying with a cousin who works 2 jobs and is unable to care for him, no safe discharge plan at this time  Anticipated d/c date is: To be determined              Patient currently is medically stable to d/c but no safe DC plan     Consultants:    Nephrology  IR  PMT  Vascular  Procedures:   Hemodialysis  Antimicrobials:  None  Subjective: -Feels better overall, breathing is improving, unable to stand up or ambulate at all independently  Objective: Vitals:   11/16/19 0930 11/16/19 1000 11/16/19 1026 11/16/19 1056  BP: (!) 153/94 (!) 147/75 138/86 (!) 151/87  Pulse: 69 69 69 69  Resp:   20 18  Temp:   98 F (36.7 C) 97.8 F (36.6 C)  TempSrc:   Oral Oral  SpO2:    100%  Weight:   61.1 kg   Height:        Intake/Output Summary (Last 24 hours) at 11/16/2019 1103 Last data filed at 11/16/2019 1026 Gross per 24 hour  Intake 480 ml  Output 4855 ml  Net -4375 ml   Filed Weights   11/16/19 0410 11/16/19 0715 11/16/19 1026  Weight: 64.4 kg 64.4 kg 61.1 kg    Examination:  General: Pleasant blind male seen on dialysis, eyes closed, alert awake, no distress HEENT: Eyes closed, no JVD CVS: S1-S2, regular rate rhythm Lungs: Diminished breath sounds at both bases Abdomen: Soft, nontender, improving abdominal wall edema Extremities: Trace edema  Data Reviewed: I have personally reviewed following labs and imaging studies  CBC: Recent Labs  Lab 11/11/19 0536 11/12/19 0354 11/13/19 0733 11/16/19 0506  WBC 9.2 8.5 7.5 6.2  HGB 8.9* 8.6* 9.1* 9.5*  HCT 27.9* 27.6* 28.5* 30.2*  MCV 93.3 94.8 94.4 93.5  PLT 227 161 192 017   Basic Metabolic Panel: Recent Labs  Lab 11/12/19 0354 11/13/19 0733 11/14/19 0616 11/15/19 0504 11/16/19 0506  NA 133* 134* 135 135 136  K 3.5 3.1* 4.0 3.7 3.4*  CL 98 96* 100 98 98  CO2 26 27 27 26 26   GLUCOSE 218* 102* 106* 165* 118*  BUN 24* 33* 22* 30* 38*  CREATININE 2.66* 3.22* 2.42* 3.25* 3.86*  CALCIUM 8.0* 8.4* 8.5* 8.5* 8.7*  PHOS 3.5 4.3 3.4 4.8* 5.7*   GFR: Estimated Creatinine Clearance: 19 mL/min (A) (by C-G formula based on SCr of 3.86 mg/dL (H)). Liver Function Tests: Recent Labs  Lab 11/12/19 0354 11/13/19 0733 11/14/19 0616 11/15/19 0504  11/16/19 0506  ALBUMIN 2.4* 2.5* 2.5* 2.6* 2.5*   No results for input(s): LIPASE, AMYLASE in the last 168 hours. Recent Labs  Lab 11/09/19 1537  AMMONIA 47*   Coagulation Profile: No results for input(s): INR, PROTIME in the last 168 hours. Cardiac Enzymes: No results for input(s): CKTOTAL, CKMB, CKMBINDEX, TROPONINI in the last 168 hours. BNP (last 3 results) No results for input(s): PROBNP in the last 8760 hours. HbA1C: No results for input(s): HGBA1C in the last 72 hours. CBG: Recent Labs  Lab 11/15/19 0658 11/15/19 1115 11/15/19 1622 11/15/19 2105 11/16/19 0603  GLUCAP 129* 173* 158* 185* 124*   Lipid Profile: No results for input(s): CHOL, HDL, LDLCALC, TRIG, CHOLHDL, LDLDIRECT in the last 72 hours. Thyroid Function Tests: No results for input(s): TSH, T4TOTAL, FREET4, T3FREE, THYROIDAB in the last 72 hours. Anemia Panel: No results for input(s): VITAMINB12, FOLATE, FERRITIN, TIBC, IRON, RETICCTPCT in the last 72 hours. Sepsis Labs: No results for input(s): PROCALCITON, LATICACIDVEN in the last 168 hours.  No results found for this or any previous visit (from  the past 240 hour(s)).       Radiology Studies: No results found.      Scheduled Meds: . amLODipine  10 mg Oral Daily  . brimonidine  1 drop Left Eye BID  . calcium acetate  667 mg Oral TID WC  . carvedilol  25 mg Oral BID WC  . Chlorhexidine Gluconate Cloth  6 each Topical Q0600  . Chlorhexidine Gluconate Cloth  6 each Topical Q0600  . darbepoetin (ARANESP) injection - NON-DIALYSIS  100 mcg Subcutaneous Q Tue-1800  . dorzolamide-timolol  1 drop Left Eye BID  . gabapentin  100 mg Oral Daily  . gatifloxacin  1 drop Left Eye QID  . insulin aspart  0-9 Units Subcutaneous TID WC  . insulin aspart  2 Units Subcutaneous TID WC  . latanoprost  1 drop Left Eye QHS  . losartan  100 mg Oral Daily  . senna-docusate  1 tablet Oral BID  . torsemide  100 mg Oral Daily   Continuous Infusions: . sodium  chloride 250 mL (11/06/19 1453)     LOS: 32 days    Time spent: 25 minutes   Domenic Polite, MD [facility]   11/16/2019, 11:03 AM

## 2019-11-16 NOTE — Procedures (Signed)
Patient was seen on dialysis and the procedure was supervised.  BFR 400  Via RIJ TDC BP is  147/75.   Patient appears to be tolerating treatment well.   Christopher Davila [name] 11/16/2019

## 2019-11-16 NOTE — TOC Progression Note (Addendum)
Transition of Care [facility]) - Progression Note    Patient Details  Name: Christopher Davila MRN: [id number] Date of Birth: 1971-02-19  Transition of [facility] CM/SW [place], [place] Phone Number: 11/16/2019, 9:30 AM  Clinical Narrative:     Update: Lorrie with [facility] is reviewing to see if they can accept for an LOG, pending acceptance at this time.   TOC team attempting to work with supervisor Nathaniel Man on LOG for patient to go to SNF. Uncertain at this time if this can be achieved. CSW spoke with patient's cousin [name] who states patient applied for disability in April, received a call for a phone assessment on May 5 but has not heard back.   CSW explained to [name] if an LOG was not able to occur for the hospital to pay for SNF services, that patient has no insurance and no payor source. CSW inquired if family able to private pay for SNF services, she reports no. CSW explained if LOG is not doable then, that patient will go home. [name] reports she understands and expressed appreciation for attempts to get patient's SNF services funded through [place].   Pending LOG decision at this time.        Expected Discharge Plan and Services                                                 Social Determinants of Health (SDOH) Interventions    Readmission Risk Interventions Readmission Risk Prevention Plan 02/19/2019  Transportation Screening Complete  PCP or Specialist Appt within 5-7 Days Complete  Home Care Screening Complete  Medication Review (RN CM) Complete  Some recent data might be hidden

## 2019-11-16 NOTE — Progress Notes (Signed)
PT Cancellation Note  Patient Details Name: Christopher Davila MRN: [id number] DOB: [birth year]   Cancelled Treatment:    Reason Eval/Treat Not Completed: Patient at procedure or test/unavailable(HD). Will follow up as schedule allows.   Rolland Porter SPT 11/16/2019    Rolland Porter 11/16/2019, 8:13 AM

## 2019-11-16 NOTE — Progress Notes (Signed)
Hypoglycemic Event  CBG: 69  Treatment: 2 cup of apple juices and crackers  Symptoms: none  Follow-up CBG: Time: 1218 CBG Result 109  Possible Reasons for Event: Patient missed breakfast  Comments/MD notified:    [name]

## 2019-11-17 LAB — RENAL FUNCTION PANEL
Albumin: 2.7 g/dL — ABNORMAL LOW (ref 3.5–5.0)
Anion gap: 11 (ref 5–15)
BUN: 30 mg/dL — ABNORMAL HIGH (ref 6–20)
CO2: 25 mmol/L (ref 22–32)
Calcium: 8.5 mg/dL — ABNORMAL LOW (ref 8.9–10.3)
Chloride: 100 mmol/L (ref 98–111)
Creatinine, Ser: 3.15 mg/dL — ABNORMAL HIGH (ref 0.61–1.24)
GFR calc Af Amer: 26 mL/min — ABNORMAL LOW (ref >60)
GFR calc non Af Amer: 22 mL/min — ABNORMAL LOW (ref >60)
Glucose, Bld: 134 mg/dL — ABNORMAL HIGH (ref 70–99)
Phosphorus: 5.1 mg/dL — ABNORMAL HIGH (ref 2.5–4.6)
Potassium: 3.9 mmol/L (ref 3.5–5.1)
Sodium: 136 mmol/L (ref 135–145)

## 2019-11-17 LAB — GLUCOSE, CAPILLARY
Glucose-Capillary: 175 mg/dL — ABNORMAL HIGH (ref 70–99)
Glucose-Capillary: 220 mg/dL — ABNORMAL HIGH (ref 70–99)
Glucose-Capillary: 135 mg/dL — ABNORMAL HIGH (ref 70–99)
Glucose-Capillary: 276 mg/dL — ABNORMAL HIGH (ref 70–99)

## 2019-11-17 MED ORDER — DARBEPOETIN ALFA 60 MCG/0.3ML IJ SOSY: 60.0000 ug | PREFILLED_SYRINGE | INTRAMUSCULAR | Status: DC

## 2019-11-17 MED ORDER — CHLORHEXIDINE GLUCONATE CLOTH 2 % EX PADS
6.0000 | MEDICATED_PAD | Freq: Every day | CUTANEOUS | Status: DC
  Administered 2019-11-18 – 2019-11-19 (×2): 6 via TOPICAL

## 2019-11-17 NOTE — Progress Notes (Signed)
Occupational Therapy Treatment Patient Details Name: Christopher Davila MRN: [id number] DOB: Jun 12, 1971 Today's Date: 11/17/2019    History of present illness Pt is an 49 y.o. male with PMH DM, HTN, CKD stage IIIb, peripheral neuropathy, diabetic retinopathy, bilateral vision loss, glaucoma, chronic diastolic heart failure who presented to ED with increased weakness.   OT comments  OT session focused on scrotal swelling and self feeding; pt did not require scrotal sling as pt with decreased edema today; pt elevation under scrotum to assist with continued  decrease in swelling. Pt set-upA for feeding and washing hands/washing face. Pt using compensatory strategies to feel around plate for food and for utensils and for drink with minimal cues. Pt would benefit from continued OT skilled services. OT following acutely.     Follow Up Recommendations  SNF    Equipment Recommendations  Other (comment)(defer to next facility)    Recommendations for Other Services      Precautions / Restrictions Precautions Precautions: Fall Precaution Comments: blind, mild scrotal swelling Restrictions Weight Bearing Restrictions: No       Mobility Bed Mobility               General bed mobility comments: In recliner pre and post session  Transfers                      Balance Overall balance assessment: Needs assistance Sitting-balance support: Feet supported Sitting balance-Leahy Scale: Fair Sitting balance - Comments: pt able to maintain unsupported sitting for on/off 5 mins for self feeding task                                   ADL either performed or assessed with clinical judgement   ADL Overall ADL's : Needs assistance/impaired Eating/Feeding: Set up;Sitting Eating/Feeding Details (indicate cue type and reason): pt set-upA for self feeding task in recliner; verbal discussion of where food/drinks located. Pt using utensils appropriately.                                  Functional mobility during ADLs: Moderate assistance;Rolling walker;Cueing for safety;Cueing for sequencing General ADL Comments: OT session focused on scrotal swelling; pt did not require scrotal sling as pt with decreased edema today; pt elevation under scrotum to assist with continued  decrease in swelling.       Vision       Perception     Praxis      Cognition Arousal/Alertness: Awake/alert Behavior During Therapy: Flat affect Overall Cognitive Status: Impaired/Different from baseline Area of Impairment: Attention;Following commands;Awareness;Problem solving                   Current Attention Level: Selective Memory: Decreased short-term memory Following Commands: Follows one step commands inconsistently;Follows one step commands with increased time Safety/Judgement: Decreased awareness of safety Awareness: Intellectual Problem Solving: Slow processing;Decreased initiation General Comments: Pt eating lunch and followed all commands        Exercises     Shoulder Instructions       General Comments VSS    Pertinent Vitals/ Pain       Pain Assessment: No/denies pain  Home Living  Prior Functioning/Environment              Frequency  Min 2X/week        Progress Toward Goals  OT Goals(current goals can now be found in the care plan section)  Progress towards OT goals: Progressing toward goals  Acute Rehab OT Goals Patient Stated Goal: none stated OT Goal Formulation: With patient Time For Goal Achievement: 11/22/19 Potential to Achieve Goals: Good ADL Goals Pt Will Perform Grooming: with min assist;standing Pt Will Perform Lower Body Bathing: with min assist;sit to/from stand;sitting/lateral leans Pt Will Perform Lower Body Dressing: with min assist;sitting/lateral leans;sit to/from stand Pt Will Transfer to Toilet: with min assist;squat pivot transfer;bedside  commode Additional ADL Goal #1: Pt will engage in standing BADL task at sink with min A for 5-7 minutes Additional ADL Goal #2: Pt will implement visual compensatory strategies to BADL task with min A  Plan Discharge plan remains appropriate    Co-evaluation                 AM-PAC OT "6 Clicks" Daily Activity     Outcome Measure   Help from another person eating meals?: A Little Help from another person taking care of personal grooming?: A Little Help from another person toileting, which includes using toliet, bedpan, or urinal?: Total Help from another person bathing (including washing, rinsing, drying)?: A Lot Help from another person to put on and taking off regular upper body clothing?: A Lot Help from another person to put on and taking off regular lower body clothing?: Total 6 Click Score: 12    End of Session Equipment Utilized During Treatment: Oxygen  OT Visit Diagnosis: Repeated falls (R29.6);Other symptoms and signs involving cognitive function   Activity Tolerance Patient tolerated treatment well   Patient Left in chair;with call bell/phone within reach;with chair alarm set   Nurse Communication Mobility status        Time: 3267-1245 OT Time Calculation (min): 10 min  Charges: OT General Charges $OT Visit: 1 Visit OT Treatments $Self Care/Home Management : 8-22 mins  Jefferey Pica, OTR/L Acute Rehabilitation Services Pager: [phone] Office: [phone]    Lafreda Casebeer C 11/17/2019, 4:38 PM

## 2019-11-17 NOTE — Progress Notes (Signed)
[facility] NEPHROLOGY PROGRESS NOTE  Assessment/ Plan: Pt is a 49 y.o. yo male with hypertension, CKD 4, DM, anemia admitted with AKI on CKD now progressed to ESRD.  #AKI on CKD 4 progressed to ESRD, newly started dialysis: Right IJ TDC placed on 5/13 and first stage BBF placed5/14/2021. CLIP'd to [place] [facility] but awaiting issues of insurance/ home assistance/ transportation. HD TTS schedule, had HD yesterday with 3.5 L UF, tolerated well.  Plan for next HD tomorrow.  #Fluid overload: Improving after dialysis.  Respiratory status improved.  #Urinary retention which was complicated by hematuria after Foley thought to be due to urethral stricture seen by urologist.  No bleeding at this time.  #Anemia of CKD: Received PRBC, IV iron.  Continue ESA.  Monitor hemoglobin.  #Secondary hyperparathyroidism: PTH 172 at goal for ESRD.  Phosphorus level at goal.  Continue PhosLo.  #Hypertension: Volume status improving.  Continue antihypertensive.  Monitor blood pressure.  Subjective: Seen and examined at bedside.  Denies nausea vomiting chest pain shortness of breath.  No new event. Objective Vital signs in last 24 hours: Vitals:   11/16/19 1615 11/17/19 0330 11/17/19 0350 11/17/19 0820  BP: 124/85 (!) 161/92  (!) 176/93  Pulse: 71 69  70  Resp: 18 20    Temp: 97.9 F (36.6 C) 98 F (36.7 C)    TempSrc: Oral Oral    SpO2: 92% 100%    Weight:   62.1 kg   Height:       Weight change: -0.011 kg  Intake/Output Summary (Last 24 hours) at 11/17/2019 1040 Last data filed at 11/17/2019 0700 Gross per 24 hour  Intake 360 ml  Output 350 ml  Net 10 ml       Labs: Basic Metabolic Panel: Recent Labs  Lab 11/15/19 0504 11/16/19 0506 11/17/19 0805  NA 135 136 136  K 3.7 3.4* 3.9  CL 98 98 100  CO2 26 26 25   GLUCOSE 165* 118* 134*  BUN 30* 38* 30*  CREATININE 3.25* 3.86* 3.15*  CALCIUM 8.5* 8.7* 8.5*  PHOS 4.8* 5.7* 5.1*   Liver Function Tests: Recent Labs  Lab  11/15/19 0504 11/16/19 0506 11/17/19 0805  ALBUMIN 2.6* 2.5* 2.7*   No results for input(s): LIPASE, AMYLASE in the last 168 hours. No results for input(s): AMMONIA in the last 168 hours. CBC: Recent Labs  Lab 11/11/19 0536 11/11/19 0536 11/12/19 0354 11/13/19 0733 11/16/19 0506  WBC 9.2   < > 8.5 7.5 6.2  HGB 8.9*   < > 8.6* 9.1* 9.5*  HCT 27.9*   < > 27.6* 28.5* 30.2*  MCV 93.3  --  94.8 94.4 93.5  PLT 227   < > 161 192 186   < > = values in this interval not displayed.   Cardiac Enzymes: No results for input(s): CKTOTAL, CKMB, CKMBINDEX, TROPONINI in the last 168 hours. CBG: Recent Labs  Lab 11/16/19 1057 11/16/19 1217 11/16/19 1616 11/16/19 2102 11/17/19 0620  GLUCAP 69* 109* 147* 199* 175*    Iron Studies: No results for input(s): IRON, TIBC, TRANSFERRIN, FERRITIN in the last 72 hours. Studies/Results: No results found.  Medications: Infusions: . sodium chloride 250 mL (11/06/19 1453)    Scheduled Medications: . amLODipine  10 mg Oral Daily  . brimonidine  1 drop Left Eye BID  . calcium acetate  667 mg Oral TID WC  . carvedilol  25 mg Oral BID WC  . Chlorhexidine Gluconate Cloth  6 each Topical Q0600  .  Chlorhexidine Gluconate Cloth  6 each Topical Q0600  . darbepoetin (ARANESP) injection - NON-DIALYSIS  100 mcg Subcutaneous Q Tue-1800  . dorzolamide-timolol  1 drop Left Eye BID  . gabapentin  100 mg Oral Daily  . gatifloxacin  1 drop Left Eye QID  . insulin aspart  0-9 Units Subcutaneous TID WC  . insulin aspart  2 Units Subcutaneous TID WC  . latanoprost  1 drop Left Eye QHS  . losartan  100 mg Oral Daily  . senna-docusate  1 tablet Oral BID  . torsemide  100 mg Oral Daily    have reviewed scheduled and prn medications.  Physical Exam: General:NAD, comfortable Heart:RRR, s1s2 nl Lungs:clear b/l, no crackle Abdomen:soft, Non-tender, non-distended Extremities:No edema Dialysis Access: Right IJ TDC and has right UE AVF good bruit and  thrill.  Kadija Cruzen Prasad Beata Beason 11/17/2019,10:40 AM  LOS: 33 days  Pager: [phone]

## 2019-11-17 NOTE — Progress Notes (Addendum)
   11/17/19 1620  Urine Characteristics  Bladder Scan Volume (mL) 435 mL   Venetia Constable, MD paged X2 regarding bladder scan volume. MD returned paged, verbal orders to do not straight catheterize patient at this time and try other less invasive measures of urinating first such as standing patient up to urinate. RN will implement orders and follow up.

## 2019-11-17 NOTE — Progress Notes (Signed)
Physical Therapy Treatment Patient Details Name: Christopher Davila MRN: [id number] DOB: 08/18/1970 Today's Date: 11/17/2019    History of Present Illness Pt is an 49 y.o. male with PMH DM, HTN, CKD stage IIIb, peripheral neuropathy, diabetic retinopathy, bilateral vision loss, glaucoma, chronic diastolic heart failure who presented to ED with increased weakness.    PT Comments    Pt admitted with above diagnosis. Pt was able to ambulate in room with RW with min guard assist. Steady with rW. Pt more interactive today. Ambulates slowly.   Pt currently with functional limitations due to the deficits listed below (see PT Problem List). Pt will benefit from skilled PT to increase their independence and safety with mobility to allow discharge to the venue listed below.     Follow Up Recommendations  SNF;Supervision/Assistance - 24 hour     Equipment Recommendations  None recommended by PT    Recommendations for Other Services       Precautions / Restrictions Precautions Precautions: Fall Precaution Comments: blind; scrotal swelling Restrictions Weight Bearing Restrictions: No    Mobility  Bed Mobility Overal bed mobility: Needs Assistance Bed Mobility: Rolling Rolling: Min guard   Supine to sit: Min guard     General bed mobility comments: Pt needed incr time for motivation to come to EOB but finally did come to EOB and was able to complete on his own with incr time.   Transfers Overall transfer level: Needs assistance Equipment used: Rolling walker (2 wheeled) Transfers: Sit to/from Stand Sit to Stand: Min guard;From elevated surface         General transfer comment: Able to stand to the RW with min guard assist with pt impulsively standing needing steadying assist  Ambulation/Gait Ambulation/Gait assistance: Min guard Gait Distance (Feet): 20 Feet Assistive device: Rolling walker (2 wheeled) Gait Pattern/deviations: Step-through pattern;Wide base of support;Trunk  flexed;Shuffle Gait velocity: decr Gait velocity interpretation: <1.8 ft/sec, indicate of risk for recurrent falls General Gait Details: Pt very slow with steps but steady with RW.  No LOB with RW. Had to provide cuing as pt is blind and is not familiar with ambulation in room.     Stairs             Wheelchair Mobility    Modified Rankin (Stroke Patients Only)       Balance Overall balance assessment: Needs assistance Sitting-balance support: Feet supported Sitting balance-Leahy Scale: Fair     Standing balance support: Bilateral upper extremity supported Standing balance-Leahy Scale: Poor Standing balance comment: reliant on UE support with RW                            Cognition Arousal/Alertness: Awake/alert Behavior During Therapy: Flat affect Overall Cognitive Status: Impaired/Different from baseline Area of Impairment: Attention;Following commands;Awareness;Problem solving                   Current Attention Level: Selective Memory: Decreased short-term memory Following Commands: Follows one step commands inconsistently;Follows one step commands with increased time Safety/Judgement: Decreased awareness of safety Awareness: Intellectual Problem Solving: Slow processing;Decreased initiation        Exercises      General Comments General comments (skin integrity, edema, etc.): VSS      Pertinent Vitals/Pain Pain Assessment: No/denies pain    Home Living                      Prior Function  PT Goals (current goals can now be found in the care plan section) Acute Rehab PT Goals Patient Stated Goal: none stated PT Goal Formulation: With patient Time For Goal Achievement: 11/29/19 Potential to Achieve Goals: Good Progress towards PT goals: Progressing toward goals    Frequency    Min 2X/week      PT Plan Current plan remains appropriate    Co-evaluation              AM-PAC PT "6 Clicks"  Mobility   Outcome Measure  Help needed turning from your back to your side while in a flat bed without using bedrails?: A Little Help needed moving from lying on your back to sitting on the side of a flat bed without using bedrails?: A Little Help needed moving to and from a bed to a chair (including a wheelchair)?: A Little Help needed standing up from a chair using your arms (e.g., wheelchair or bedside chair)?: A Little Help needed to walk in hospital room?: A Little Help needed climbing 3-5 steps with a railing? : A Lot 6 Click Score: 17    End of Session Equipment Utilized During Treatment: Gait belt Activity Tolerance: Patient tolerated treatment well Patient left: in chair;with call bell/phone within reach;with chair alarm set Nurse Communication: Mobility status PT Visit Diagnosis: Unsteadiness on feet (R26.81);Muscle weakness (generalized) (M62.81);Difficulty in walking, not elsewhere classified (R26.2) Pain - Right/Left: (scrotum)     Time: 1000-1023 PT Time Calculation (min) (ACUTE ONLY): 23 min  Charges:  $Gait Training: 23-37 mins                     Keishawna Carranza W,PT [place] Pager:  [phone]  Office:  [place] 11/17/2019, 11:00 AM

## 2019-11-17 NOTE — Progress Notes (Signed)
   11/17/19 1447  Unmeasured Output  Stool Occurrence 1  Stool Characteristics  Bowel Incontinence No  Stool Type Type 1 (Separate hard lumps)  Has the patient had three Type 7 stools in the last 24 hours? No  Stool Descriptors Brown  Stool Amount Medium  Stool Source Rectum   Patient refused morning scheduled dose of Senokot-S 1 tablet, stated he has had bowel movements and did not need it. Patient helped to bedside commode at 1447 and required manual disimpaction after attempting to have bowel movement. Patient tolerated well with no complaints.

## 2019-11-17 NOTE — Progress Notes (Signed)
[facility] PROGRESS NOTE    Progress Note  Christopher Davila  PNT:[id number] DOB: 1971-05-10 DOA: 10/13/2019 PCP: Marliss Coots, NP     Brief Narrative:   Christopher Davila is an 49 y.o. male past medical history significant for essential hypertension, diabetes mellitus gastroparesis blind in chronic disease stage IV chronic diastolic heart failure, recently discharged from the hospital on 09/27/2019 for acute on chronic diastolic heart failure admitted with acute kidney injury, severe metabolic acidosis started on IV fluids, diarrhea since then has resolved.  The patient has now developed volume overload was started on IV Lasix drip and metolazone, he remains fluid overloaded oliguric and with uremic symptoms.  Nephrology was consulted and started on hemodialysis on 10/28/2019 right arm graft in place, due to drop in hemoglobin he was transfused 2 units of packed red blood cells and DDAVP.  He started developing hematuria Foley was placed urology was consulted and hematuria has resolved.  Assessment/Plan:   Acute respiratory failure with hypoxia due to acute on chronic diastolic heart failure: She was started on IV Lasix and metolazone with poor urine output. Nephrology was consulted they started him on hemodialysis and high-dose torsemide.  3 L negative after dialysis, has decreased to 62.1 kg.  He was as high as 5 L of oxygen down 2 L we will continue to wean down to room air. Going back through the chart this is one of the lowest weight he has ever had so he is probably getting close to his dry weight. Disposition remains challenging and unsafe patient is homeless blind and staying with cousin.  Social worker is helping with disposition.  Acute on chronic kidney disease stage IV with progression to end-stage renal disease new to HD: Right arm AV graft placed on 10/29/2019 need to follow-up with vascular surgery in 6 weeks. Continue HD per renal and appreciate assistance.  Acute blood loss  anemia/anemia of chronic renal disease: Of his hemoglobin he was transfused 2 units of packed red blood cells on 11/06/2019. His hemoglobin is now ranging around 9, due to his urethral bleeding urology was consulted and they thought his hematuria secondary to Foley catheter removal. Foley was replaced, hematuria has cleared and his Foley was removed 11/11/2019  Anion gap metabolic acidosis due to acute kidney injury on chronic kidney disease stage V: Resolved.  Hyperphosphatemia: Improved.  Acute metabolic encephalopathy: Secondary to uremia mental status has improved.  Hypothermia: Has resolved.  Uncontrolled diabetes mellitus type 2 : Blood glucose elevated but improved on long-acting insulin and  sliding scale.  Essential hypertension uncontrolled: Started on low-dose Coreg, and amlodipine, his blood pressures remains elevated we will continue to monitor and titrate medications as tolerated.  Hyperlipidemia: Continue statins.  Diarrhea: Resolved.  RN Pressure Injury Documentation: Pressure Injury 11/05/19 Sacrum Mid Stage 2 -  Partial thickness loss of dermis presenting as a shallow open injury with a red, pink wound bed without slough. (Active)  11/05/19 1127  Location: Sacrum  Location Orientation: Mid  Staging: Stage 2 -  Partial thickness loss of dermis presenting as a shallow open injury with a red, pink wound bed without slough.  Wound Description (Comments):   Present on Admission: Yes     Pressure Injury 11/05/19 Scrotum Mid Stage 2 -  Partial thickness loss of dermis presenting as a shallow open injury with a red, pink wound bed without slough. between scrotum and anus, patient states he has been scratching area too (Active)  11/05/19 1129  Location: Scrotum  Location  Orientation: Mid  Staging: Stage 2 -  Partial thickness loss of dermis presenting as a shallow open injury with a red, pink wound bed without slough.  Wound Description (Comments): between scrotum  and anus, patient states he has been scratching area too  Present on Admission:     Estimated body mass index is 24.27 kg/m as calculated from the following:   Height as of this encounter: 5\' 3"  (1.6 m).   Weight as of this encounter: 62.1 kg.   DVT prophylaxis: lovenox Family Communication:none Status is: Inpatient  Remains inpatient appropriate because:Ongoing active pain requiring inpatient pain management   Dispo: The patient is from: Home              Anticipated d/c is to: SNF              Anticipated d/c date is: > 3 days              Patient currently is not medically stable to d/c.         Code Status:     Code Status Orders  (From admission, onward)         Start     Ordered   10/13/19 1044  Full code  Continuous     10/13/19 1044        Code Status History    Date Active Date Inactive Code Status Order ID Comments User Context   09/26/2019 0113 09/28/2019 0002 Full Code [id number]  Donnamae Jude, MD ED   09/19/2019 1329 09/24/2019 1245 Full Code [id number]  Norval Morton, MD ED   06/16/2019 1205 06/17/2019 1928 Full Code [id number]  Neva Seat, MD ED   04/17/2019 0747 04/18/2019 2108 Full Code [id number]  Norval Morton, MD ED   02/12/2019 2204 02/20/2019 1831 Full Code [id number]  Gifford Shave, MD ED   05/23/2015 2051 05/24/2015 1929 Full Code [id number]  Edwin Dada, MD Inpatient   02/14/2014 1619 02/15/2014 1703 Full Code [id number]  Bernadene Bell, MD Inpatient   Advance Care Planning Activity        IV Access:    Peripheral IV   Procedures and diagnostic studies:   No results found.   Medical Consultants:    None.  Anti-Infectives:   none  Subjective:    Christopher Davila he relates his breathing is better no new cough no new complaints.  Objective:    Vitals:   11/16/19 1056 11/16/19 1615 11/17/19 0330 11/17/19 0350  BP: (!) 151/87 124/85 (!) 161/92   Pulse: 69 71 69   Resp: 18 18 20    Temp: 97.8 F (36.6  C) 97.9 F (36.6 C) 98 F (36.7 C)   TempSrc: Oral Oral Oral   SpO2: 100% 92% 100%   Weight:    62.1 kg  Height:       SpO2: 100 % O2 Flow Rate (L/min): 2 L/min   Intake/Output Summary (Last 24 hours) at 11/17/2019 0753 Last data filed at 11/17/2019 0351 Gross per 24 hour  Intake 240 ml  Output 3850 ml  Net -3610 ml   Filed Weights   11/16/19 0715 11/16/19 1026 11/17/19 0350  Weight: 64.4 kg 61.1 kg 62.1 kg    Exam: General exam: In no acute distress. Respiratory system: Good air movement and clear to auscultation. Cardiovascular system: S1 & S2 heard, RRR. No JVD Gastrointestinal system: Abdomen is nondistended, soft and nontender.  Extremities: No pedal edema. Skin: No rashes, lesions or ulcers Psychiatry:  Judgement and insight appear normal.    Data Reviewed:    Labs: Basic Metabolic Panel: Recent Labs  Lab 11/12/19 0354 11/12/19 0354 11/13/19 1700 11/13/19 1749 11/14/19 0616 11/14/19 0616 11/15/19 0504 11/16/19 0506  NA 133*  --  134*  --  135  --  135 136  K 3.5   < > 3.1*   < > 4.0   < > 3.7 3.4*  CL 98  --  96*  --  100  --  98 98  CO2 26  --  27  --  27  --  26 26  GLUCOSE 218*  --  102*  --  106*  --  165* 118*  BUN 24*  --  33*  --  22*  --  30* 38*  CREATININE 2.66*  --  3.22*  --  2.42*  --  3.25* 3.86*  CALCIUM 8.0*  --  8.4*  --  8.5*  --  8.5* 8.7*  PHOS 3.5  --  4.3  --  3.4  --  4.8* 5.7*   < > = values in this interval not displayed.   GFR Estimated Creatinine Clearance: 19 mL/min (A) (by C-G formula based on SCr of 3.86 mg/dL (H)). Liver Function Tests: Recent Labs  Lab 11/12/19 0354 11/13/19 0733 11/14/19 0616 11/15/19 0504 11/16/19 0506  ALBUMIN 2.4* 2.5* 2.5* 2.6* 2.5*   No results for input(s): LIPASE, AMYLASE in the last 168 hours. No results for input(s): AMMONIA in the last 168 hours. Coagulation profile No results for input(s): INR, PROTIME in the last 168 hours. COVID-19 Labs  No results for input(s): DDIMER,  FERRITIN, LDH, CRP in the last 72 hours.  Lab Results  Component Value Date   SARSCOV2NAA NEGATIVE 10/13/2019   [place] NEGATIVE 10/05/2019   [place] NEGATIVE 09/25/2019   [place] NEGATIVE 09/19/2019    CBC: Recent Labs  Lab 11/11/19 0536 11/12/19 0354 11/13/19 0733 11/16/19 0506  WBC 9.2 8.5 7.5 6.2  HGB 8.9* 8.6* 9.1* 9.5*  HCT 27.9* 27.6* 28.5* 30.2*  MCV 93.3 94.8 94.4 93.5  PLT 227 161 192 186   Cardiac Enzymes: No results for input(s): CKTOTAL, CKMB, CKMBINDEX, TROPONINI in the last 168 hours. BNP (last 3 results) No results for input(s): PROBNP in the last 8760 hours. CBG: Recent Labs  Lab 11/16/19 1057 11/16/19 1217 11/16/19 1616 11/16/19 2102 11/17/19 0620  GLUCAP 69* 109* 147* 199* 175*   D-Dimer: No results for input(s): DDIMER in the last 72 hours. Hgb A1c: No results for input(s): HGBA1C in the last 72 hours. Lipid Profile: No results for input(s): CHOL, HDL, LDLCALC, TRIG, CHOLHDL, LDLDIRECT in the last 72 hours. Thyroid function studies: No results for input(s): TSH, T4TOTAL, T3FREE, THYROIDAB in the last 72 hours.  Invalid input(s): FREET3 Anemia work up: No results for input(s): VITAMINB12, FOLATE, FERRITIN, TIBC, IRON, RETICCTPCT in the last 72 hours. Sepsis Labs: Recent Labs  Lab 11/11/19 0536 11/12/19 0354 11/13/19 0733 11/16/19 0506  WBC 9.2 8.5 7.5 6.2   Microbiology No results found for this or any previous visit (from the past 240 hour(s)).   Medications:   . amLODipine  10 mg Oral Daily  . brimonidine  1 drop Left Eye BID  . calcium acetate  667 mg Oral TID WC  . carvedilol  25 mg Oral BID WC  . Chlorhexidine Gluconate Cloth  6 each Topical Q0600  . Chlorhexidine Gluconate Cloth  6 each Topical Q0600  . darbepoetin (ARANESP) injection -  NON-DIALYSIS  100 mcg Subcutaneous Q Tue-1800  . dorzolamide-timolol  1 drop Left Eye BID  . gabapentin  100 mg Oral Daily  . gatifloxacin  1 drop Left Eye QID  . insulin  aspart  0-9 Units Subcutaneous TID WC  . insulin aspart  2 Units Subcutaneous TID WC  . latanoprost  1 drop Left Eye QHS  . losartan  100 mg Oral Daily  . senna-docusate  1 tablet Oral BID  . torsemide  100 mg Oral Daily   Continuous Infusions: . sodium chloride 250 mL (11/06/19 1453)      LOS: 33 days   Charlynne Cousins  [facility]  11/17/2019, 7:53 AM

## 2019-11-17 NOTE — TOC Progression Note (Signed)
Transition of Care [facility]) - Progression Note    Patient Details  Name: Christopher Davila MRN: [id number] Date of Birth: Oct 16, 1970  Transition of Care [facility]) CM/SW [place], [place] Phone Number: 11/17/2019, 1:05 PM  Clinical Narrative:     CSW completed LOG paperwork for patient, submitted to admin. Informed Lorrie at [facility], pending acceptance confirmation at this time.        Expected Discharge Plan and Services                                                 Social Determinants of Health (SDOH) Interventions    Readmission Risk Interventions Readmission Risk Prevention Plan 02/19/2019  Transportation Screening Complete  PCP or Specialist Appt within 5-7 Days Complete  Home Care Screening Complete  Medication Review (RN CM) Complete  Some recent data might be hidden

## 2019-11-18 LAB — CBC
HCT: 31.3 % — ABNORMAL LOW (ref 39.0–52.0)
Hemoglobin: 9.7 g/dL — ABNORMAL LOW (ref 13.0–17.0)
MCH: 29.3 pg (ref 26.0–34.0)
MCHC: 31 g/dL (ref 30.0–36.0)
MCV: 94.6 fL (ref 80.0–100.0)
Platelets: 139 10*3/uL — ABNORMAL LOW (ref 150–400)
RBC: 3.31 MIL/uL — ABNORMAL LOW (ref 4.22–5.81)
RDW: 15.8 % — ABNORMAL HIGH (ref 11.5–15.5)
WBC: 6.3 10*3/uL (ref 4.0–10.5)
nRBC: 0 % (ref 0.0–0.2)

## 2019-11-18 LAB — RENAL FUNCTION PANEL
Albumin: 2.4 g/dL — ABNORMAL LOW (ref 3.5–5.0)
Anion gap: 10 (ref 5–15)
BUN: 36 mg/dL — ABNORMAL HIGH (ref 6–20)
CO2: 24 mmol/L (ref 22–32)
Calcium: 8.3 mg/dL — ABNORMAL LOW (ref 8.9–10.3)
Chloride: 97 mmol/L — ABNORMAL LOW (ref 98–111)
Creatinine, Ser: 3.91 mg/dL — ABNORMAL HIGH (ref 0.61–1.24)
GFR calc Af Amer: 20 mL/min — ABNORMAL LOW (ref >60)
GFR calc non Af Amer: 17 mL/min — ABNORMAL LOW (ref >60)
Glucose, Bld: 201 mg/dL — ABNORMAL HIGH (ref 70–99)
Phosphorus: 5 mg/dL — ABNORMAL HIGH (ref 2.5–4.6)
Potassium: 3.9 mmol/L (ref 3.5–5.1)
Sodium: 131 mmol/L — ABNORMAL LOW (ref 135–145)

## 2019-11-18 LAB — GLUCOSE, CAPILLARY
Glucose-Capillary: 129 mg/dL — ABNORMAL HIGH (ref 70–99)
Glucose-Capillary: 108 mg/dL — ABNORMAL HIGH (ref 70–99)
Glucose-Capillary: 213 mg/dL — ABNORMAL HIGH (ref 70–99)
Glucose-Capillary: 253 mg/dL — ABNORMAL HIGH (ref 70–99)

## 2019-11-18 MED ORDER — POLYETHYLENE GLYCOL 3350 17 G PO PACK
17.0000 g | PACK | Freq: Two times a day (BID) | ORAL | Status: DC
  Administered 2019-11-18 – 2019-11-19 (×3): 17 g via ORAL
  Filled 2019-11-18 (×3): qty 1

## 2019-11-18 MED ORDER — LIDOCAINE HCL (PF) 1 % IJ SOLN: 5.0000 mL | INTRAMUSCULAR | Status: DC | PRN

## 2019-11-18 MED ORDER — LIDOCAINE-PRILOCAINE 2.5-2.5 % EX CREA: 1.0000 "application " | TOPICAL_CREAM | CUTANEOUS | Status: DC | PRN

## 2019-11-18 MED ORDER — ALTEPLASE 2 MG IJ SOLR: 2.0000 mg | Freq: Once | INTRAMUSCULAR | Status: DC | PRN

## 2019-11-18 MED ORDER — HEPARIN SODIUM (PORCINE) 1000 UNIT/ML IJ SOLN
INTRAMUSCULAR | Status: AC
  Administered 2019-11-18: 3200 [IU] via INTRAVENOUS_CENTRAL
  Filled 2019-11-18: qty 4

## 2019-11-18 MED ORDER — SODIUM CHLORIDE 0.9 % IV SOLN: 100.0000 mL | INTRAVENOUS | Status: DC | PRN

## 2019-11-18 MED ORDER — HEPARIN SODIUM (PORCINE) 1000 UNIT/ML IJ SOLN
INTRAMUSCULAR | Status: AC
  Administered 2019-11-18: 1200 [IU] via INTRAVENOUS_CENTRAL
  Filled 2019-11-18: qty 2

## 2019-11-18 MED ORDER — HEPARIN SODIUM (PORCINE) 1000 UNIT/ML DIALYSIS: 1000.0000 [IU] | INTRAMUSCULAR | Status: DC | PRN

## 2019-11-18 MED ORDER — HEPARIN SODIUM (PORCINE) 1000 UNIT/ML DIALYSIS: 20.0000 [IU]/kg | INTRAMUSCULAR | Status: DC | PRN

## 2019-11-18 MED ORDER — ENOXAPARIN SODIUM 30 MG/0.3ML ~~LOC~~ SOLN
30.0000 mg | SUBCUTANEOUS | Status: DC
  Administered 2019-11-18: 30 mg via SUBCUTANEOUS
  Filled 2019-11-18: qty 0.3

## 2019-11-18 MED ORDER — PENTAFLUOROPROP-TETRAFLUOROETH EX AERO: 1.0000 "application " | INHALATION_SPRAY | CUTANEOUS | Status: DC | PRN

## 2019-11-18 NOTE — Procedures (Signed)
Patient was seen on dialysis and the procedure was supervised.  BFR 400  Via TDC BP is  153/92.   Patient appears to be tolerating treatment well.  Christopher Davila [name] 11/18/2019

## 2019-11-18 NOTE — Progress Notes (Signed)
Renal Navigator received call from CSW/A. Hill who states patient has been approved to go to [facility] in [place] on an LOG from the hospital and needs to have his OP HD clinic switched to either [facility] or [facility] [place]. Navigator has contacted [facility] [place], as patient will need to return to [place] clinic at discharge from SNF and these two clinics are with the same company.  Navigator has contacted Quarry manager at [facility] as well as [employer] to request a transient seat (from [place]) at [place] clinic. [facility] notified. Navigator will follow closely.  Alphonzo Cruise, [place] Renal Navigator [phone]

## 2019-11-18 NOTE — Progress Notes (Signed)
[facility] PROGRESS NOTE    Progress Note  Christopher Davila  GXQ:[id number] DOB: 10/13/1970 DOA: 10/13/2019 PCP: Marliss Coots, NP     Brief Narrative:   Christopher Davila is an 49 y.o. male past medical history significant for essential hypertension, diabetes mellitus gastroparesis blind in chronic disease stage IV chronic diastolic heart failure, recently discharged from the hospital on 09/27/2019 for acute on chronic diastolic heart failure admitted with acute kidney injury, severe metabolic acidosis started on IV fluids, diarrhea since then has resolved.  The patient has now developed volume overload was started on IV Lasix drip and metolazone, he remains fluid overloaded oliguric and with uremic symptoms.  Nephrology was consulted and started on hemodialysis on 10/28/2019 right arm graft in place, due to drop in hemoglobin he was transfused 2 units of packed red blood cells and DDAVP.  He started developing hematuria Foley was placed urology was consulted and hematuria has resolved.  Assessment/Plan:   Acute respiratory failure with hypoxia due to acute on chronic diastolic heart failure: Did not respond to IV lasix. Nephrology was consulted they started him on hemodialysis and high-dose torsemide.  3 L negative after dialysis, has decreased to 62.1 kg.  He was as high as 5 L of oxygen down 2 L we will continue to wean down to room air. Going back through the chart this is one of the lowest weight he has ever had so he is probably getting close to his dry weight. Disposition remains challenging and unsafe patient is homeless blind and staying with cousin.  Social worker is helping with disposition.  Acute on chronic kidney disease stage IV with progression to end-stage renal disease new to HD: Right arm AV graft placed on 10/29/2019 need to follow-up with vascular surgery in 6 weeks. Continue HD per renal and appreciate assistance.  Acute blood loss anemia/anemia of chronic renal  disease: Of his hemoglobin he was transfused 2 units of packed red blood cells on 11/06/2019. His hemoglobin is now ranging around 9, due to his urethral bleeding urology was consulted and they thought his hematuria secondary to Foley catheter removal. Foley was replaced, hematuria has cleared and his Foley was removed 11/11/2019  Anion gap metabolic acidosis due to acute kidney injury on chronic kidney disease stage V: Resolved.  Hyperphosphatemia: Improved.  Acute metabolic encephalopathy: Secondary to uremia mental status has improved.  Hypothermia: Has resolved.  Uncontrolled diabetes mellitus type 2 : Blood glucose elevated but improved on long-acting insulin and  sliding scale.  Essential hypertension uncontrolled: Started on low-dose Coreg, and amlodipine, his blood pressures remains elevated we will continue to monitor and titrate medications as tolerated.  Hyperlipidemia: Continue statins.  Diarrhea: Resolved.  RN Pressure Injury Documentation: Pressure Injury 11/05/19 Sacrum Mid Stage 2 -  Partial thickness loss of dermis presenting as a shallow open injury with a red, pink wound bed without slough. (Active)  11/05/19 1127  Location: Sacrum  Location Orientation: Mid  Staging: Stage 2 -  Partial thickness loss of dermis presenting as a shallow open injury with a red, pink wound bed without slough.  Wound Description (Comments):   Present on Admission: Yes     Pressure Injury 11/05/19 Scrotum Mid Stage 2 -  Partial thickness loss of dermis presenting as a shallow open injury with a red, pink wound bed without slough. between scrotum and anus, patient states he has been scratching area too (Active)  11/05/19 1129  Location: Scrotum  Location Orientation: Mid  Staging: Stage 2 -  Partial thickness loss of dermis presenting as a shallow open injury with a red, pink wound bed without slough.  Wound Description (Comments): between scrotum and anus, patient states he has  been scratching area too  Present on Admission:     Estimated body mass index is 23.91 kg/m as calculated from the following:   Height as of this encounter: 5\' 3"  (1.6 m).   Weight as of this encounter: 61.2 kg.   DVT prophylaxis: lovenox Family Communication:none Status is: Inpatient  Remains inpatient appropriate because:Ongoing active pain requiring inpatient pain management   Dispo: The patient is from: Home              Anticipated d/c is to: SNF              Anticipated d/c date is: > 3 days              Patient currently is not medically stable to d/c.         Code Status:     Code Status Orders  (From admission, onward)         Start     Ordered   10/13/19 1044  Full code  Continuous     10/13/19 1044        Code Status History    Date Active Date Inactive Code Status Order ID Comments User Context   09/26/2019 0113 09/28/2019 0002 Full Code [id number]  Donnamae Jude, MD ED   09/19/2019 1329 09/24/2019 1245 Full Code [id number]  Norval Morton, MD ED   06/16/2019 1205 06/17/2019 1928 Full Code [id number]  Neva Seat, MD ED   04/17/2019 0747 04/18/2019 2108 Full Code [id number]  Norval Morton, MD ED   02/12/2019 2204 02/20/2019 1831 Full Code [id number]  Gifford Shave, MD ED   05/23/2015 2051 05/24/2015 1929 Full Code [id number]  Edwin Dada, MD Inpatient   02/14/2014 1619 02/15/2014 1703 Full Code [id number]  Bernadene Bell, MD Inpatient   Advance Care Planning Activity        IV Access:    Peripheral IV   Procedures and diagnostic studies:   No results found.   Medical Consultants:    None.  Anti-Infectives:   none  Subjective:    Christopher Davila no new complaints.  Objective:    Vitals:   11/18/19 0930 11/18/19 1000 11/18/19 1120 11/18/19 1152  BP: 139/80 139/84 (!) 145/89 (!) 158/96  Pulse: 71 72 71 75  Resp:   18 18  Temp:   98.1 F (36.7 C) 98 F (36.7 C)  TempSrc:   Oral Oral  SpO2:   100% 94%  Weight:    61.2 kg   Height:       SpO2: 94 % O2 Flow Rate (L/min): 2 L/min   Intake/Output Summary (Last 24 hours) at 11/18/2019 1247 Last data filed at 11/18/2019 1120 Gross per 24 hour  Intake 760 ml  Output 3850 ml  Net -3090 ml   Filed Weights   11/18/19 0334 11/18/19 0738 11/18/19 1120  Weight: 64.4 kg 66.2 kg 61.2 kg    Exam: General exam: In no acute distress. Respiratory system: Good air movement and clear to auscultation. Cardiovascular system: S1 & S2 heard, RRR. No JVD Gastrointestinal system: Abdomen is nondistended, soft and nontender.  Extremities: No pedal edema. Skin: No rashes, lesions or ulcers Psychiatry: Judgement and insight appear normal.    Data Reviewed:    Labs: Basic Metabolic Panel: Recent Labs  Lab 11/14/19 9163 11/14/19 8466 11/15/19 0504 11/15/19 0504 11/16/19 0506 11/16/19 0506 11/17/19 0805 11/18/19 0154  NA 135  --  135  --  136  --  136 131*  K 4.0   < > 3.7   < > 3.4*   < > 3.9 3.9  CL 100  --  98  --  98  --  100 97*  CO2 27  --  26  --  26  --  25 24  GLUCOSE 106*  --  165*  --  118*  --  134* 201*  BUN 22*  --  30*  --  38*  --  30* 36*  CREATININE 2.42*  --  3.25*  --  3.86*  --  3.15* 3.91*  CALCIUM 8.5*  --  8.5*  --  8.7*  --  8.5* 8.3*  PHOS 3.4  --  4.8*  --  5.7*  --  5.1* 5.0*   < > = values in this interval not displayed.   GFR Estimated Creatinine Clearance: 18.8 mL/min (A) (by C-G formula based on SCr of 3.91 mg/dL (H)). Liver Function Tests: Recent Labs  Lab 11/14/19 0616 11/15/19 0504 11/16/19 0506 11/17/19 0805 11/18/19 0154  ALBUMIN 2.5* 2.6* 2.5* 2.7* 2.4*   No results for input(s): LIPASE, AMYLASE in the last 168 hours. No results for input(s): AMMONIA in the last 168 hours. Coagulation profile No results for input(s): INR, PROTIME in the last 168 hours. COVID-19 Labs  No results for input(s): DDIMER, FERRITIN, LDH, CRP in the last 72 hours.  Lab Results  Component Value Date   SARSCOV2NAA NEGATIVE  10/13/2019   [place] NEGATIVE 10/05/2019   [place] NEGATIVE 09/25/2019   [place] NEGATIVE 09/19/2019    CBC: Recent Labs  Lab 11/12/19 0354 11/13/19 0733 11/16/19 0506 11/18/19 0756  WBC 8.5 7.5 6.2 6.3  HGB 8.6* 9.1* 9.5* 9.7*  HCT 27.6* 28.5* 30.2* 31.3*  MCV 94.8 94.4 93.5 94.6  PLT 161 192 186 139*   Cardiac Enzymes: No results for input(s): CKTOTAL, CKMB, CKMBINDEX, TROPONINI in the last 168 hours. BNP (last 3 results) No results for input(s): PROBNP in the last 8760 hours. CBG: Recent Labs  Lab 11/17/19 1058 11/17/19 1611 11/17/19 2108 11/18/19 0631 11/18/19 1153  GLUCAP 220* 135* 276* 129* 108*   D-Dimer: No results for input(s): DDIMER in the last 72 hours. Hgb A1c: No results for input(s): HGBA1C in the last 72 hours. Lipid Profile: No results for input(s): CHOL, HDL, LDLCALC, TRIG, CHOLHDL, LDLDIRECT in the last 72 hours. Thyroid function studies: No results for input(s): TSH, T4TOTAL, T3FREE, THYROIDAB in the last 72 hours.  Invalid input(s): FREET3 Anemia work up: No results for input(s): VITAMINB12, FOLATE, FERRITIN, TIBC, IRON, RETICCTPCT in the last 72 hours. Sepsis Labs: Recent Labs  Lab 11/12/19 0354 11/13/19 0733 11/16/19 0506 11/18/19 0756  WBC 8.5 7.5 6.2 6.3   Microbiology No results found for this or any previous visit (from the past 240 hour(s)).   Medications:   . amLODipine  10 mg Oral Daily  . brimonidine  1 drop Left Eye BID  . calcium acetate  667 mg Oral TID WC  . carvedilol  25 mg Oral BID WC  . Chlorhexidine Gluconate Cloth  6 each Topical Q0600  . Chlorhexidine Gluconate Cloth  6 each Topical Q0600  . Chlorhexidine Gluconate Cloth  6 each Topical Q0600  . [START ON 11/23/2019] darbepoetin (ARANESP) injection - DIALYSIS  60 mcg Intravenous Q Tue-HD  .  dorzolamide-timolol  1 drop Left Eye BID  . gabapentin  100 mg Oral Daily  . gatifloxacin  1 drop Left Eye QID  . insulin aspart  0-9 Units Subcutaneous TID  WC  . insulin aspart  2 Units Subcutaneous TID WC  . latanoprost  1 drop Left Eye QHS  . losartan  100 mg Oral Daily  . senna-docusate  1 tablet Oral BID  . torsemide  100 mg Oral Daily   Continuous Infusions: . sodium chloride 250 mL (11/06/19 1453)      LOS: 34 days   Charlynne Cousins  [facility]  11/18/2019, 12:47 PM

## 2019-11-18 NOTE — Progress Notes (Signed)
Patient has been given an 11:50am seat time at [facility] on a TTS schedule while he is at [facility] in [place]. He needs to arrive to his appointments at 11:30am. On his first day at the clinic, he needs to arrive at 11:00am to complete intake paperwork. Navigator notified M. Bergman/PA to request that OP HD orders be sent to the clinic. Navigator notified CSW/A. Hill. Patient is cleared for discharged from an OP HD standpoint.   Alphonzo Cruise, [place] Renal Navigator [phone]

## 2019-11-18 NOTE — TOC Progression Note (Addendum)
Transition of Care [facility]) - Progression Note    Patient Details  Name: Christopher Davila MRN: [id number] Date of Birth: 1971/03/31  Transition of Care [facility]) CM/SW [place], [place] Phone Number: 11/18/2019, 12:03 PM  Clinical Narrative:     Update: [facility] able to accept patient with LOG for SNF services, CSW informed renal navigator Terri Piedra who is setting patient up at the SNF's preferred HD facilities today, plan for dc to SNF tomorrow 6/4.   CSW spoke with Lorrie with [facility], she reports they are waiting final decision if patient can be accepted with LOG, she will let CSW know.        Expected Discharge Plan and Services                                                 Social Determinants of Health (SDOH) Interventions    Readmission Risk Interventions Readmission Risk Prevention Plan 02/19/2019  Transportation Screening Complete  PCP or Specialist Appt within 5-7 Days Complete  Home Care Screening Complete  Medication Review (RN CM) Complete  Some recent data might be hidden

## 2019-11-19 DIAGNOSIS — I5032 Chronic diastolic (congestive) heart failure: Secondary | ICD-10-CM

## 2019-11-19 LAB — RENAL FUNCTION PANEL
Albumin: 2.6 g/dL — ABNORMAL LOW (ref 3.5–5.0)
Anion gap: 11 (ref 5–15)
BUN: 23 mg/dL — ABNORMAL HIGH (ref 6–20)
CO2: 23 mmol/L (ref 22–32)
Calcium: 8.5 mg/dL — ABNORMAL LOW (ref 8.9–10.3)
Chloride: 97 mmol/L — ABNORMAL LOW (ref 98–111)
Creatinine, Ser: 3.03 mg/dL — ABNORMAL HIGH (ref 0.61–1.24)
GFR calc Af Amer: 27 mL/min — ABNORMAL LOW (ref >60)
GFR calc non Af Amer: 23 mL/min — ABNORMAL LOW (ref >60)
Glucose, Bld: 157 mg/dL — ABNORMAL HIGH (ref 70–99)
Phosphorus: 3.3 mg/dL (ref 2.5–4.6)
Potassium: 4.9 mmol/L (ref 3.5–5.1)
Sodium: 131 mmol/L — ABNORMAL LOW (ref 135–145)

## 2019-11-19 LAB — GLUCOSE, CAPILLARY: Glucose-Capillary: 151 mg/dL — ABNORMAL HIGH (ref 70–99)

## 2019-11-19 MED ORDER — LOSARTAN POTASSIUM 100 MG PO TABS: 100.0000 mg | ORAL_TABLET | Freq: Every day | ORAL | Status: DC

## 2019-11-19 MED ORDER — TORSEMIDE 100 MG PO TABS: 100.0000 mg | ORAL_TABLET | Freq: Every day | ORAL | Status: DC

## 2019-11-19 MED ORDER — DARBEPOETIN ALFA 60 MCG/0.3ML IJ SOSY: 60.0000 ug | PREFILLED_SYRINGE | INTRAMUSCULAR | Status: DC

## 2019-11-19 MED ORDER — AMLODIPINE BESYLATE 10 MG PO TABS: 10.0000 mg | ORAL_TABLET | Freq: Every day | ORAL | Status: DC

## 2019-11-19 MED ORDER — CALCIUM ACETATE (PHOS BINDER) 667 MG PO CAPS: 667.0000 mg | ORAL_CAPSULE | Freq: Three times a day (TID) | ORAL | Status: DC

## 2019-11-19 NOTE — NC FL2 (Signed)
[place] LEVEL OF CARE SCREENING TOOL     IDENTIFICATION  Patient Name: Christopher Davila Birthdate: December 05, 1970 Sex: male Admission Date (Current Location): 10/13/2019  [facility] [address] and [place] Number:  Herbalist and Address:  [facility], [place] [address], [place], [place] [address]      Provider Number: [id number]  Attending Physician Name and Address:  Charlynne Cousins, MD  Relative Name and Phone Number:  [name] [phone]    Current Level of Care: Hospital Recommended Level of Care: [place] Prior Approval Number:    Date Approved/Denied:   PASRR Number: [id number]  Discharge Plan: SNF    Current Diagnoses: Patient Active Problem List   Diagnosis Date Noted  . Pressure injury of skin 11/06/2019  . ESRD on dialysis ([place])   . Heart failure with preserved ejection fraction ([place])   . Goals of care, counseling/discussion   . Palliative care encounter   . Diarrhea 10/13/2019  . Metabolic acidosis, increased anion gap 10/13/2019  . Acute urinary retention 10/13/2019  . Weakness 10/05/2019  . Acute on chronic diastolic CHF (congestive heart failure) ([place]) 09/27/2019  . Vision loss of left eye 09/21/2019  . Acute kidney injury superimposed on chronic kidney disease ([place]) 09/19/2019  . Hyponatremia 09/19/2019  . Homeless 09/19/2019  . Anemia of chronic disease 09/19/2019  . Vision loss, left eye 09/19/2019  . Acute loss of vision, right 06/16/2019  . Hypertensive urgency 04/17/2019  . Hypokalemia 04/17/2019  . Normocytic anemia 04/17/2019  . Hyperglycemia   . CKD (chronic kidney disease) stage 4, GFR 15-29 ml/min (HCC)   . Alcohol use   . Gastroparesis due to DM ([place])   . Essential hypertension   . Hyperlipidemia   . Type 2 diabetes mellitus with hypoglycemia ([place]) 06/26/2012    Orientation RESPIRATION BLADDER Height & Weight     Self, Time, Place, Situation  Normal Incontinent, External catheter  Weight: 139 lb (63 kg) Height:  5\' 3"  (160 cm)  BEHAVIORAL SYMPTOMS/MOOD NEUROLOGICAL BOWEL NUTRITION STATUS      Incontinent Diet(see discharge summary)  AMBULATORY STATUS COMMUNICATION OF NEEDS Skin   Extensive Assist Verbally Other (Comment)(cracking on heels and excoriated scrotum)                       Personal Care Assistance Level of Assistance  Bathing, Dressing, Feeding, Total care Bathing Assistance: Limited assistance Feeding assistance: Limited assistance Dressing Assistance: Maximum assistance Total Care Assistance: Maximum assistance   Functional Limitations Info  Sight, Speech, Hearing Sight Info: Impaired Hearing Info: Adequate Speech Info: Adequate    SPECIAL CARE FACTORS FREQUENCY  PT (By licensed PT), OT (By licensed OT)     PT Frequency: min 5x weekly OT Frequency: min 5x weekly            Contractures Contractures Info: Not present    Additional Factors Info  Code Status, Allergies Code Status Info: full Allergies Info: Morphine and related           Current Medications (11/19/2019):  This is the current hospital active medication list Current Facility-Administered Medications  Medication Dose Route Frequency Provider Last Rate Last Admin  . 0.9 %  sodium chloride infusion   Intravenous PRN Ulyses Amor, PA-C 10 mL/hr at 11/06/19 1453 250 mL at 11/06/19 1453  . acetaminophen (TYLENOL) tablet 650 mg  650 mg Oral Q6H PRN Ulyses Amor, PA-C   650 mg at 11/15/19 1313  Or  . acetaminophen (TYLENOL) suppository 650 mg  650 mg Rectal Q6H PRN Laurence Slate M, PA-C      . amLODipine (NORVASC) tablet 10 mg  10 mg Oral Daily Domenic Polite, MD   10 mg at 11/19/19 0824  . brimonidine (ALPHAGAN) 0.15 % ophthalmic solution 1 drop  1 drop Left Eye BID Laurence Slate M, [place]   1 drop at 11/18/19 2112  . calcium acetate (PHOSLO) capsule 667 mg  667 mg Oral TID WC Reesa Chew, MD   667 mg at 11/19/19 0824  . carvedilol (COREG) tablet 25 mg  25  mg Oral BID WC Reesa Chew, MD   25 mg at 11/19/19 0824  . Chlorhexidine Gluconate Cloth 2 % PADS 6 each  6 each Topical Q0600 Rosita Fire, MD   6 each at 11/19/19 0559  . [START ON 11/23/2019] Darbepoetin Alfa (ARANESP) injection 60 mcg  60 mcg Intravenous Q Tue-HD Rosita Fire, MD      . diphenhydrAMINE (BENADRYL) capsule 25 mg  25 mg Oral Q6H PRN Chauncey Mann, MD   25 mg at 11/18/19 1843  . dorzolamide-timolol (COSOPT) 22.3-6.8 MG/ML ophthalmic solution 1 drop  1 drop Left Eye BID Laurence Slate M, [place]   1 drop at 11/18/19 2112  . enoxaparin (LOVENOX) injection 30 mg  30 mg Subcutaneous Q24H Charlynne Cousins, MD   30 mg at 11/18/19 1422  . gabapentin (NEURONTIN) capsule 100 mg  100 mg Oral Daily Regalado, Belkys A, MD   100 mg at 11/19/19 0824  . gatifloxacin (ZYMAXID) 0.5 % ophthalmic drops 1 drop  1 drop Left Eye QID Laurence Slate M, PA-C   1 drop at 11/18/19 2112  . hydrALAZINE (APRESOLINE) injection 5 mg  5 mg Intravenous Q6H PRN Domenic Polite, MD   5 mg at 11/12/19 1130  . HYDROcodone-acetaminophen (NORCO/VICODIN) 5-325 MG per tablet 1-2 tablet  1-2 tablet Oral Q6H PRN Domenic Polite, MD   2 tablet at 11/19/19 0559  . insulin aspart (novoLOG) injection 0-9 Units  0-9 Units Subcutaneous TID WC Domenic Polite, MD   2 Units at 11/19/19 0825  . insulin aspart (novoLOG) injection 2 Units  2 Units Subcutaneous TID WC Domenic Polite, MD   2 Units at 11/19/19 [phone]  . latanoprost (XALATAN) 0.005 % ophthalmic solution 1 drop  1 drop Left Eye QHS Laurence Slate M, [place]   1 drop at 11/18/19 2112  . lidocaine (XYLOCAINE) 2 % viscous mouth solution 15 mL  15 mL Mouth/Throat Q6H PRN Lovey Newcomer T, NP   15 mL at 10/30/19 0525  . losartan (COZAAR) tablet 100 mg  100 mg Oral Daily Reesa Chew, MD   100 mg at 11/19/19 0824  . ondansetron (ZOFRAN) injection 4 mg  4 mg Intravenous Q6H PRN Lovey Newcomer T, NP   4 mg at 10/30/19 1237  . polyethylene glycol (MIRALAX /  GLYCOLAX) packet 17 g  17 g Oral BID Charlynne Cousins, MD   17 g at 11/19/19 0825  . senna-docusate (Senokot-S) tablet 1 tablet  1 tablet Oral BID Ulyses Amor, PA-C   1 tablet at 11/19/19 3295  . sodium chloride irrigation 0.9 % 1,000 mL  1,000 mL Irrigation PRN Irine Seal, MD      . torsemide [facility]) tablet 100 mg  100 mg Oral Daily Justin Mend, MD   100 mg at 11/19/19 0825  . traZODone (DESYREL) tablet 50 mg  50 mg Oral  QHS PRN Vianne Bulls, MD   50 mg at 11/18/19 2113     Discharge Medications: Please see discharge summary for a list of discharge medications.  Relevant Imaging Results:  Relevant Lab Results:   Additional Information SSN: [id number]  Alberteen Sam, LCSW

## 2019-11-19 NOTE — Discharge Summary (Signed)
Physician Discharge Summary  Hines Kloss EHO:[id number] DOB: 1971-01-25 DOA: 10/13/2019  PCP: Marliss Coots, NP  Admit date: 10/13/2019 Discharge date: 11/19/2019  Admitted From: Home Disposition:  Home  Recommendations for Outpatient Follow-up:  1. Follow up with Renal in 1-2 weeks 2. Please obtain BMP/CBC in one week   Home Health:No Equipment/Devices:None  Discharge Condition:Stable CODE STATUS:Full Diet recommendation: Heart Healthy   Brief/Interim Summary: 49 y.o. male past medical history significant for essential hypertension, diabetes mellitus gastroparesis blind in chronic disease stage IV chronic diastolic heart failure, recently discharged from the hospital on 09/27/2019 for acute on chronic diastolic heart failure admitted with acute kidney injury, severe metabolic acidosis started on IV fluids, diarrhea since then has resolved.  The patient has now developed volume overload was started on IV Lasix drip and metolazone, he remains fluid overloaded oliguric and with uremic symptoms.  Discharge Diagnoses:  Principal Problem:   Diarrhea Active Problems:   Type 2 diabetes mellitus with hypoglycemia (HCC)   Essential hypertension   Hyperlipidemia   CKD (chronic kidney disease) stage 4, GFR 15-29 ml/min (HCC)   Weakness   Metabolic acidosis, increased anion gap   Acute urinary retention   ESRD on dialysis (HCC)   Heart failure with preserved ejection fraction (HCC)   Goals of care, counseling/discussion   Palliative care encounter   Pressure injury of skin  Acute respiratory failure with hypoxia due to acute on chronic diastolic heart failure in the setting of end-stage renal disease: The patient did not respond to IV Lasix and metolazone. Nephrology was consulted recommended hemodialysis and high dose diuretics.  He will continue his dialysis Tuesday Thursdays and Saturdays. We had difficulties placing the patient as he was homeless, Education officer, museum found the skilled  nursing facility.  And he will be transported to his dialysis Wednesday schedule.  Acute on chronic kidney disease stage IV with progression to end-stage renal disease: Right arm graft placed on 10/29/2019 will need vascular surgery follow-up as an outpatient in 6 weeks. Continue hemodialysis per renal through hemodialysis catheter.  Acute blood loss anemia/anemia of chronic disease: Likely due to renal failure continue Aranesp and IV iron as an outpatient.  Metabolic acidosis due end-stage renal disease: Resolved with hemodialysis he will go bicarbonate tablets.  Acute metabolic encephalopathy: Secondary to uremia resolved with hemodialysis.  Hypothermia: Infection.  Controlled diabetes mellitus type 2: Continue long-acting insulin per sliding scale.  Essential hypertension uncontrolled: He was started on nadolol and amlodipine, torsemide was added and his blood pressures remain under 140/80 improved from 1 admission.  Hyperlipidemia:  continue statins.   Discharge Instructions  Discharge Instructions    Diet - low sodium heart healthy   Complete by: As directed    Increase activity slowly   Complete by: As directed    No wound care   Complete by: As directed      Allergies as of 11/19/2019      Reactions   Morphine And Related Itching, Other (See Comments)   Pt prefers not to be given this drug      Medication List    STOP taking these medications   carvedilol 25 MG tablet Commonly known as: COREG   furosemide 40 MG tablet Commonly known as: Lasix     TAKE these medications   acetaminophen 500 MG tablet Commonly known as: TYLENOL Take 1,000 mg by mouth every 6 (six) hours as needed for mild pain or moderate pain.   amLODipine 10 MG tablet Commonly known as:  NORVASC Take 1 tablet (10 mg total) by mouth daily.   aspirin EC 81 MG tablet Take 1 tablet (81 mg total) by mouth daily.   brimonidine 0.1 % Soln Commonly known as: ALPHAGAN P Place 1 drop into  the left eye 2 (two) times daily.   calcium acetate 667 MG capsule Commonly known as: PHOSLO Take 1 capsule (667 mg total) by mouth 3 (three) times daily with meals.   Darbepoetin Alfa 60 MCG/0.3ML Sosy injection Commonly known as: ARANESP Inject 0.3 mLs (60 mcg total) into the vein every Tuesday with hemodialysis. Start taking on: November 23, 2019   dorzolamide-timolol 22.3-6.8 MG/ML ophthalmic solution Commonly known as: COSOPT Place 1 drop into the left eye 2 (two) times daily.   gabapentin 300 MG capsule Commonly known as: NEURONTIN Take 1 capsule (300 mg total) by mouth 2 (two) times daily. What changed: when to take this   glipiZIDE 10 MG tablet Commonly known as: GLUCOTROL Take 1 tablet (10 mg total) by mouth 2 (two) times daily.   hydrALAZINE 25 MG tablet Commonly known as: APRESOLINE Take 0.5 tablets (12.5 mg total) by mouth 3 (three) times daily.   latanoprost 0.005 % ophthalmic solution Commonly known as: XALATAN Place 1 drop into the left eye at bedtime.   losartan 100 MG tablet Commonly known as: COZAAR Take 1 tablet (100 mg total) by mouth daily.   moxifloxacin 0.5 % ophthalmic solution Commonly known as: VIGAMOX Place 1 drop into the left eye 2 (two) times daily.   sodium bicarbonate 650 MG tablet Take 1 tablet (650 mg total) by mouth 3 (three) times daily.   torsemide 100 MG tablet Commonly known as: DEMADEX Take 1 tablet (100 mg total) by mouth daily.            Durable Medical Equipment  (From admission, onward)         Start     Ordered   10/14/19 1209  For home use only DME Shower stool  Once     10/14/19 1208   10/14/19 1209  For home use only DME Bedside commode  Once    Question:  Patient needs a bedside commode to treat with the following condition  Answer:  Weakness   10/14/19 1209          Allergies  Allergen Reactions  . Morphine And Related Itching and Other (See Comments)    Pt prefers not to be given this drug     Consultations:  Nephrology   Procedures/Studies: CT ABDOMEN PELVIS WO CONTRAST  Result Date: 10/27/2019 CLINICAL DATA:  Shortness of breath and diarrhea. EXAM: CT ABDOMEN AND PELVIS WITHOUT CONTRAST TECHNIQUE: Multidetector CT imaging of the abdomen and pelvis was performed following the standard protocol without IV contrast. COMPARISON:  October 07, 2013 FINDINGS: Lower chest: Moderate severity areas of consolidation are seen within the bilateral lung bases. Small to moderate size bilateral pleural effusions are also seen. Hepatobiliary: No focal liver abnormality is seen. A mild amount of perihepatic fluid is seen. No gallstones, gallbladder wall thickening, or biliary dilatation. Pancreas: Unremarkable. No pancreatic ductal dilatation or surrounding inflammatory changes. Spleen: Normal in size without focal abnormality. Adrenals/Urinary Tract: Adrenal glands are unremarkable. Kidneys are normal, without renal calculi, focal lesion, or hydronephrosis. A Foley catheter is seen within the urinary bladder. Stomach/Bowel: Stomach is within normal limits. The appendix is not clearly visualized no evidence of bowel wall thickening, distention, or inflammatory changes. Vascular/Lymphatic: There is mild calcification of the abdominal aorta.  No enlarged abdominal or pelvic lymph nodes. Reproductive: Prostate is unremarkable. Other: There is a mild amount of abdominopelvic free fluid. Musculoskeletal: Marked severity edema and subcutaneous inflammatory fat stranding seen throughout the abdominal and pelvic walls. No acute osseous abnormalities are seen. IMPRESSION: 1. Moderate severity bibasilar consolidation and small to moderate size bilateral pleural effusions. 2. Marked severity edema and subcutaneous inflammatory fat stranding throughout the abdominal and pelvic walls. 3. Mild amount of abdominopelvic free fluid. Aortic Atherosclerosis (ICD10-I70.0). Electronically Signed   By: Virgina Norfolk M.D.   On:  10/27/2019 19:18   CT HEAD WO CONTRAST  Result Date: 10/27/2019 CLINICAL DATA:  Altered mental status, fatigue EXAM: CT HEAD WITHOUT CONTRAST TECHNIQUE: Contiguous axial images were obtained from the base of the skull through the vertex without intravenous contrast. COMPARISON:  CT 06/15/2019, MRI 06/16/2019 FINDINGS: Brain: Stable sequela of remote left basal ganglia infarct. No evidence of acute infarction, hemorrhage, hydrocephalus, extra-axial collection or mass lesion/mass effect. Symmetric prominence of the ventricles, cisterns and sulci compatible with parenchymal volume loss. Patchy areas of white matter hypoattenuation are most compatible with chronic microvascular angiopathy. Chronic dural calcifications are similar to priors. Vascular: Atherosclerotic calcification of the carotid siphons. No hyperdense vessel. Skull: No calvarial fracture or suspicious osseous lesion. No scalp swelling or hematoma. Sinuses/Orbits: Mild nodular mural thickening in the maxillary sinuses with a small layering air-fluid level in the left sphenoid sinus. There is rightward nasal septal deviation with a right-sided nasal septal spur. Other: Extensive periodontal disease with multiple carious lesions and periapical lucencies. IMPRESSION: 1. No acute intracranial abnormality. 2. Stable sequela of remote left basal ganglia infarct. 3. Stable parenchymal volume loss and chronic microvascular angiopathy. 4. Small layering air-fluid level in the left sphenoid sinus, could assess for clinical symptoms of acute sinusitis. 5. Periodontal disease, could correlate with outpatient dental examination Electronically Signed   By: Lovena Le M.D.   On: 10/27/2019 19:16   US Abdomen Limited  Result Date: 11/04/2019 CLINICAL DATA:  Distended enlarged abdomen assess for paracentesis EXAM: LIMITED ABDOMEN ULTRASOUND FOR ASCITES TECHNIQUE: Limited ultrasound survey for ascites was performed in all four abdominal quadrants. COMPARISON:   10/27/2019 FINDINGS: Small volume of diffuse abdominopelvic ascites, not enough to warrant therapeutic paracentesis. Procedure not performed. Pleural effusions noted bilaterally. IMPRESSION: Small volume abdominopelvic ascites. Electronically Signed   By: Jerilynn Mages.  Shick M.D.   On: 11/04/2019 14:36   IR Fluoro Guide CV Line Right  Result Date: 10/29/2019 INDICATION: 49 year old male in need of hemodialysis. He presents for placement of a tunneled hemodialysis catheter. EXAM: TUNNELED CENTRAL VENOUS HEMODIALYSIS CATHETER PLACEMENT WITH ULTRASOUND AND FLUOROSCOPIC GUIDANCE MEDICATIONS: 2 g Ancef. The antibiotic was given in an appropriate time interval prior to skin puncture. ANESTHESIA/SEDATION: Moderate (conscious) sedation was employed during this procedure. A total of Versed 1 mg and Fentanyl 50 mcg was administered intravenously. Moderate Sedation Time: 17 minutes. The patient's level of consciousness and vital signs were monitored continuously by radiology nursing throughout the procedure under my direct supervision. FLUOROSCOPY TIME:  Fluoroscopy Time: 0 minutes 6 seconds (1 mGy). COMPLICATIONS: None immediate. PROCEDURE: Informed written consent was obtained from the patient after a discussion of the risks, benefits, and alternatives to treatment. Questions regarding the procedure were encouraged and answered. The right neck and chest were prepped with chlorhexidine in a sterile fashion, and a sterile drape was applied covering the operative field. Maximum barrier sterile technique with sterile gowns and gloves were used for the procedure. A timeout was performed prior to  the initiation of the procedure. After creating a small venotomy incision, a micropuncture kit was utilized to access the right internal jugular vein under direct, real-time ultrasound guidance after the overlying soft tissues were anesthetized with 1% lidocaine with epinephrine. Ultrasound image documentation was performed. The microwire was  kinked to measure appropriate catheter length. A stiff Glidewire was advanced to the level of the IVC and the micropuncture sheath was exchanged for a peel-away sheath. A palindrome tunneled hemodialysis catheter measuring 19 cm from tip to cuff was tunneled in a retrograde fashion from the anterior chest wall to the venotomy incision. The catheter was then placed through the peel-away sheath with tips ultimately positioned within the superior aspect of the right atrium. Final catheter positioning was confirmed and documented with a spot radiographic image. The catheter aspirates and flushes normally. The catheter was flushed with appropriate volume heparin dwells. The catheter exit site was secured with a 0-Prolene retention suture. The venotomy incision was closed with an interrupted 4-0 Vicryl, Dermabond and Steri-strips. Dressings were applied. The patient tolerated the procedure well without immediate post procedural complication. IMPRESSION: Successful placement of 19 cm tip to cuff tunneled hemodialysis catheter via the right internal jugular vein with tips terminating within the superior aspect of the right atrium. The catheter is ready for immediate use. Electronically Signed   By: Jacqulynn Cadet M.D.   On: 10/29/2019 10:50   IR US Guide Vasc Access Right  Result Date: 10/29/2019 INDICATION: 49 year old male in need of hemodialysis. He presents for placement of a tunneled hemodialysis catheter. EXAM: TUNNELED CENTRAL VENOUS HEMODIALYSIS CATHETER PLACEMENT WITH ULTRASOUND AND FLUOROSCOPIC GUIDANCE MEDICATIONS: 2 g Ancef. The antibiotic was given in an appropriate time interval prior to skin puncture. ANESTHESIA/SEDATION: Moderate (conscious) sedation was employed during this procedure. A total of Versed 1 mg and Fentanyl 50 mcg was administered intravenously. Moderate Sedation Time: 17 minutes. The patient's level of consciousness and vital signs were monitored continuously by radiology nursing  throughout the procedure under my direct supervision. FLUOROSCOPY TIME:  Fluoroscopy Time: 0 minutes 6 seconds (1 mGy). COMPLICATIONS: None immediate. PROCEDURE: Informed written consent was obtained from the patient after a discussion of the risks, benefits, and alternatives to treatment. Questions regarding the procedure were encouraged and answered. The right neck and chest were prepped with chlorhexidine in a sterile fashion, and a sterile drape was applied covering the operative field. Maximum barrier sterile technique with sterile gowns and gloves were used for the procedure. A timeout was performed prior to the initiation of the procedure. After creating a small venotomy incision, a micropuncture kit was utilized to access the right internal jugular vein under direct, real-time ultrasound guidance after the overlying soft tissues were anesthetized with 1% lidocaine with epinephrine. Ultrasound image documentation was performed. The microwire was kinked to measure appropriate catheter length. A stiff Glidewire was advanced to the level of the IVC and the micropuncture sheath was exchanged for a peel-away sheath. A palindrome tunneled hemodialysis catheter measuring 19 cm from tip to cuff was tunneled in a retrograde fashion from the anterior chest wall to the venotomy incision. The catheter was then placed through the peel-away sheath with tips ultimately positioned within the superior aspect of the right atrium. Final catheter positioning was confirmed and documented with a spot radiographic image. The catheter aspirates and flushes normally. The catheter was flushed with appropriate volume heparin dwells. The catheter exit site was secured with a 0-Prolene retention suture. The venotomy incision was closed with an interrupted 4-0  Vicryl, Dermabond and Steri-strips. Dressings were applied. The patient tolerated the procedure well without immediate post procedural complication. IMPRESSION: Successful placement  of 19 cm tip to cuff tunneled hemodialysis catheter via the right internal jugular vein with tips terminating within the superior aspect of the right atrium. The catheter is ready for immediate use. Electronically Signed   By: Jacqulynn Cadet M.D.   On: 10/29/2019 10:50   DG CHEST PORT 1 VIEW  Result Date: 11/11/2019 CLINICAL DATA:  Hypoxia EXAM: PORTABLE CHEST 1 VIEW COMPARISON:  Portable exam 0823 hours compared to 10/25/2019 FINDINGS: RIGHT jugular line with tip projecting over SVC. Upper normal heart size. Stable mediastinal contours. BILATERAL pleural effusions and pulmonary edema again identified. No pneumothorax or acute osseous findings. IMPRESSION: Persistent pulmonary edema and BILATERAL pleural effusions. Electronically Signed   By: Lavonia Dana M.D.   On: 11/11/2019 08:50   DG Chest Port 1 View  Result Date: 10/25/2019 CLINICAL DATA:  Bilateral leg swelling EXAM: PORTABLE CHEST 1 VIEW COMPARISON:  For 03/06/2020 FINDINGS: Progressive diffuse bilateral airspace disease. Progressive bilateral pleural effusions and bibasilar atelectasis. Mild cardiac enlargement. IMPRESSION: Progression of congestive heart failure with edema. Enlarging bilateral pleural effusions. Electronically Signed   By: Franchot Gallo M.D.   On: 10/25/2019 11:34   VAS [place] UPPER EXT VEIN MAPPING (PRE-OP AVF)  Result Date: 10/26/2019 UPPER EXTREMITY VEIN MAPPING  Indications: Pre-access. Performing Technologist: June Leap RDMS, RVT  Examination Guidelines: A complete evaluation includes B-mode imaging, spectral Doppler, color Doppler, and power Doppler as needed of all accessible portions of each vessel. Bilateral testing is considered an integral part of a complete examination. Limited examinations for reoccurring indications may be performed as noted. +-----------------+-------------+----------+-----------+ Right Cephalic   Diameter (cm)Depth (cm) Findings   +-----------------+-------------+----------+-----------+  Shoulder             2.82        0.32               +-----------------+-------------+----------+-----------+ Mid upper arm        0.27        0.16               +-----------------+-------------+----------+-----------+ Dist upper arm       0.28                           +-----------------+-------------+----------+-----------+ Antecubital fossa    0.33        0.28     IV site   +-----------------+-------------+----------+-----------+ Prox forearm                            IV bandages +-----------------+-------------+----------+-----------+ Mid forearm          0.31        0.22               +-----------------+-------------+----------+-----------+ Dist forearm         0.30                           +-----------------+-------------+----------+-----------+ +-----------------+-------------+----------+---------+ Right Basilic    Diameter (cm)Depth (cm)Findings  +-----------------+-------------+----------+---------+ Mid upper arm        0.49        0.48             +-----------------+-------------+----------+---------+ Dist upper arm       0.30  0.53   branching +-----------------+-------------+----------+---------+ Antecubital fossa    0.28        0.34   branching +-----------------+-------------+----------+---------+ +-----------------+-------------+----------+-----------+ Left Cephalic    Diameter (cm)Depth (cm) Findings   +-----------------+-------------+----------+-----------+ Shoulder             0.31        0.73               +-----------------+-------------+----------+-----------+ Mid upper arm        0.27        0.64               +-----------------+-------------+----------+-----------+ Dist upper arm       0.31        0.41               +-----------------+-------------+----------+-----------+ Antecubital fossa    0.31        0.23               +-----------------+-------------+----------+-----------+ Prox forearm         0.31                            +-----------------+-------------+----------+-----------+ Mid forearm          0.32        0.62               +-----------------+-------------+----------+-----------+ Wrist                                   IV bandages +-----------------+-------------+----------+-----------+ +-----------------+-------------+----------+---------+ Left Basilic     Diameter (cm)Depth (cm)Findings  +-----------------+-------------+----------+---------+ Mid upper arm        0.40        1.21             +-----------------+-------------+----------+---------+ Dist upper arm       0.39        1.60   branching +-----------------+-------------+----------+---------+ Antecubital fossa    0.45        1.18   branching +-----------------+-------------+----------+---------+ *See table(s) above for measurements and observations.  Diagnosing physician: Harold Barban MD Electronically signed by Harold Barban MD on 10/26/2019 at 9:46:02 PM.    Final        Subjective: No complaints feels great, he had several bowel movements.  Discharge Exam: Vitals:   11/19/19 0530 11/19/19 0800  BP: (!) 152/94 (!) 141/86  Pulse: 70 69  Resp: 16 16  Temp: 98.4 F (36.9 C) 98.4 F (36.9 C)  SpO2: 95%    Vitals:   11/18/19 2132 11/19/19 0530 11/19/19 0542 11/19/19 0800  BP: 136/85 (!) 152/94  (!) 141/86  Pulse: 74 70  69  Resp: '16 16  16  ' Temp: 98.4 F (36.9 C) 98.4 F (36.9 C)  98.4 F (36.9 C)  TempSrc: Oral Oral  Oral  SpO2: 93% 95%    Weight:   63 kg   Height:        General: Pt is alert, awake, not in acute distress Cardiovascular: RRR, S1/S2 +, no rubs, no gallops Respiratory: CTA bilaterally, no wheezing, no rhonchi Abdominal: Soft, NT, ND, bowel sounds + Extremities: no edema, no cyanosis    The results of significant diagnostics from this hospitalization (including imaging, microbiology, ancillary and laboratory) are listed below for reference.      Microbiology: No results found for this or any previous visit (from the past  240 hour(s)).   Labs: BNP (last 3 results) Recent Labs    10/05/19 0118 10/15/19 0444 10/27/19 0719  BNP 496.1* 633.1* 898.4*   Basic Metabolic Panel: Recent Labs  Lab 11/15/19 0504 11/16/19 0506 11/17/19 0805 11/18/19 0154 11/19/19 0435  NA 135 136 136 131* 131*  K 3.7 3.4* 3.9 3.9 4.9  CL 98 98 100 97* 97*  CO2 '26 26 25 24 23  ' GLUCOSE 165* 118* 134* 201* 157*  BUN 30* 38* 30* 36* 23*  CREATININE 3.25* 3.86* 3.15* 3.91* 3.03*  CALCIUM 8.5* 8.7* 8.5* 8.3* 8.5*  PHOS 4.8* 5.7* 5.1* 5.0* 3.3   Liver Function Tests: Recent Labs  Lab 11/15/19 0504 11/16/19 0506 11/17/19 0805 11/18/19 0154 11/19/19 0435  ALBUMIN 2.6* 2.5* 2.7* 2.4* 2.6*   No results for input(s): LIPASE, AMYLASE in the last 168 hours. No results for input(s): AMMONIA in the last 168 hours. CBC: Recent Labs  Lab 11/13/19 0733 11/16/19 0506 11/18/19 0756  WBC 7.5 6.2 6.3  HGB 9.1* 9.5* 9.7*  HCT 28.5* 30.2* 31.3*  MCV 94.4 93.5 94.6  PLT 192 186 139*   Cardiac Enzymes: No results for input(s): CKTOTAL, CKMB, CKMBINDEX, TROPONINI in the last 168 hours. BNP: Invalid input(s): POCBNP CBG: Recent Labs  Lab 11/18/19 0631 11/18/19 1153 11/18/19 1608 11/18/19 2143 11/19/19 0623  GLUCAP 129* 108* 213* 253* 151*   D-Dimer No results for input(s): DDIMER in the last 72 hours. Hgb A1c No results for input(s): HGBA1C in the last 72 hours. Lipid Profile No results for input(s): CHOL, HDL, LDLCALC, TRIG, CHOLHDL, LDLDIRECT in the last 72 hours. Thyroid function studies No results for input(s): TSH, T4TOTAL, T3FREE, THYROIDAB in the last 72 hours.  Invalid input(s): FREET3 Anemia work up No results for input(s): VITAMINB12, FOLATE, FERRITIN, TIBC, IRON, RETICCTPCT in the last 72 hours. Urinalysis    Component Value Date/Time   COLORURINE AMBER (A) 10/27/2019 1559   APPEARANCEUR TURBID (A) 10/27/2019 1559    LABSPEC 1.014 10/27/2019 1559   PHURINE 5.0 10/27/2019 1559   GLUCOSEU 50 (A) 10/27/2019 1559   HGBUR LARGE (A) 10/27/2019 1559   BILIRUBINUR NEGATIVE 10/27/2019 1559   KETONESUR NEGATIVE 10/27/2019 1559   PROTEINUR 100 (A) 10/27/2019 1559   UROBILINOGEN 0.2 12/13/2014 2056   NITRITE NEGATIVE 10/27/2019 1559   LEUKOCYTESUR LARGE (A) 10/27/2019 1559   Sepsis Labs Invalid input(s): PROCALCITONIN,  WBC,  LACTICIDVEN Microbiology No results found for this or any previous visit (from the past 240 hour(s)).   Time coordinating discharge: Over 40 minutes  SIGNED:   Charlynne Cousins, MD  [facility] 11/19/2019, 8:27 AM Pager   If 7PM-7AM, please contact night-coverage [email or web address] Password [email or web address]

## 2019-11-19 NOTE — Progress Notes (Signed)
Pt d/c to [facility] (SNF). Pt currently A&O x4 in no distress, transported via [facility].

## 2019-11-24 IMAGING — CR DG CHEST 2V
2 series · 2 of 2 positions shown · non-contrast
Comparison: 12/22/2017 chest radiograph.

CLINICAL DATA: Chest pain

EXAM:
CHEST - 2 VIEW

[chest pa]
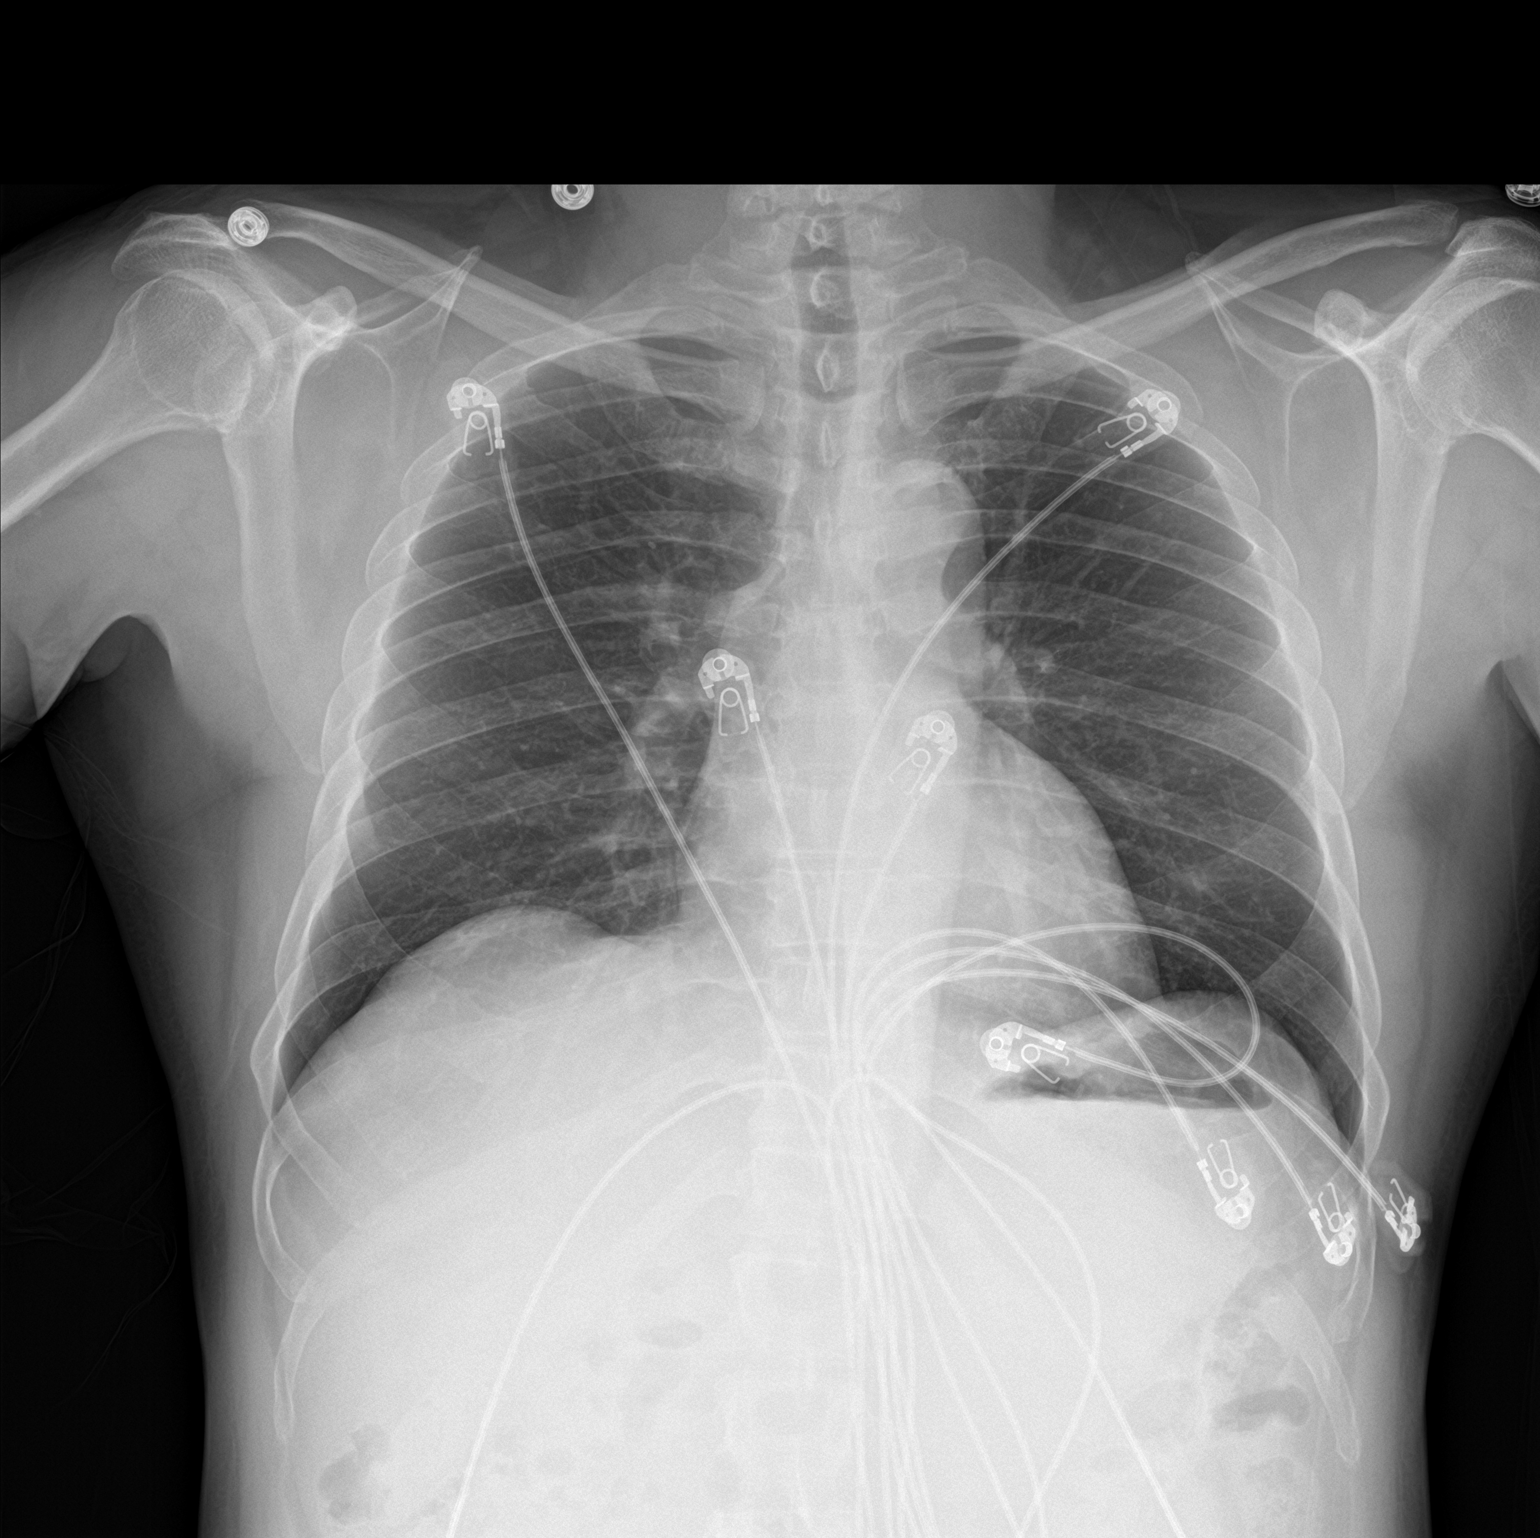

[chest lat]
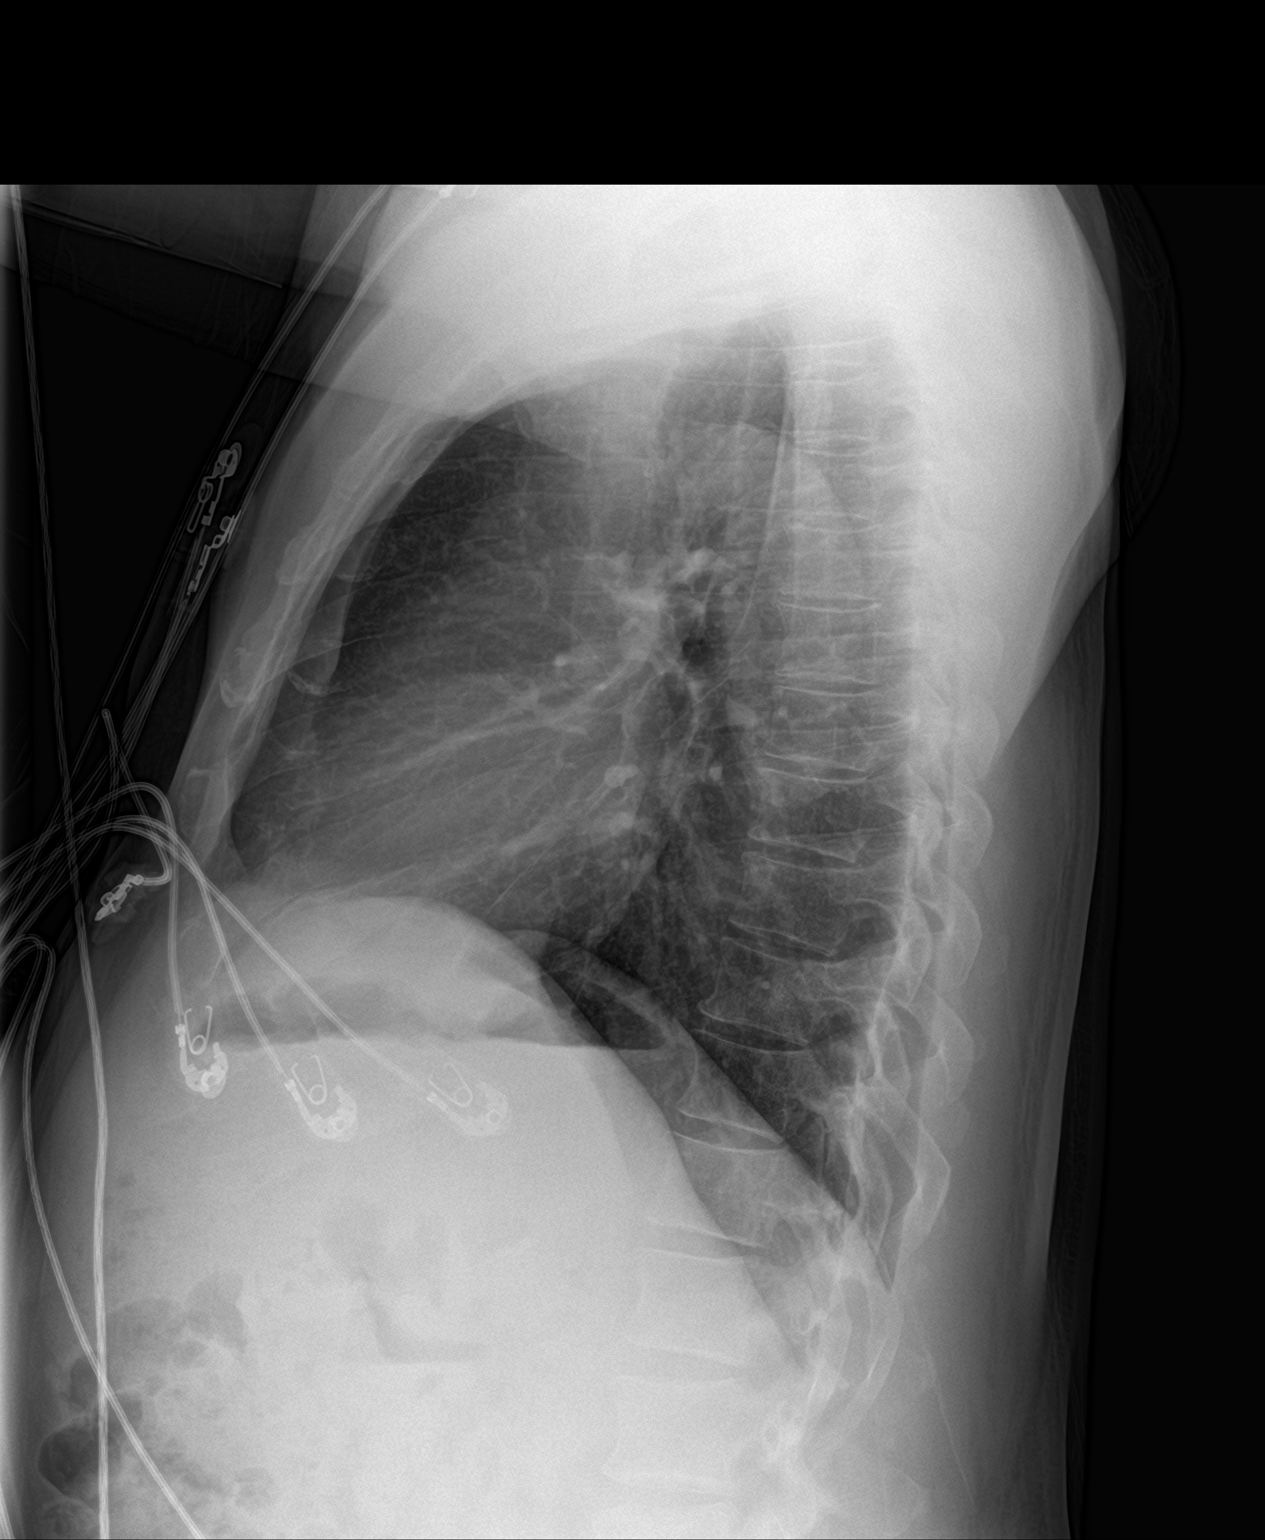

[2 of 2 positions shown; findings below may reference images not displayed]

FINDINGS: Stable cardiomediastinal silhouette with normal heart size. No
pneumothorax. No pleural effusion. Lungs appear clear, with no acute
consolidative airspace disease and no pulmonary edema.
IMPRESSION: No active cardiopulmonary disease.

## 2019-11-30 ENCOUNTER — Other Ambulatory Visit: Payer: Self-pay | Admitting: *Deleted

## 2019-11-30 DIAGNOSIS — N184 Chronic kidney disease, stage 4 (severe): Secondary | ICD-10-CM

## 2019-12-07 ENCOUNTER — Ambulatory Visit (HOSPITAL_COMMUNITY): Admit: 2019-12-07 | Payer: MEDICAID

## 2019-12-27 ENCOUNTER — Telehealth (HOSPITAL_COMMUNITY): Payer: Self-pay | Admitting: *Deleted

## 2019-12-27 ENCOUNTER — Other Ambulatory Visit: Payer: Self-pay | Admitting: *Deleted

## 2019-12-27 DIAGNOSIS — N184 Chronic kidney disease, stage 4 (severe): Secondary | ICD-10-CM

## 2019-12-27 NOTE — Telephone Encounter (Signed)
12/27/2019 1:11 unable to leave msg. ss

## 2019-12-28 ENCOUNTER — Other Ambulatory Visit: Payer: Self-pay

## 2019-12-28 ENCOUNTER — Ambulatory Visit (HOSPITAL_COMMUNITY)
Admission: RE | Admit: 2019-12-28 | Discharge: 2019-12-28 | Disposition: A | Discharge status: Home / Self Care | Payer: Medicaid Other | Source: Ambulatory Visit | Attending: Vascular Surgery | Admitting: Vascular Surgery

## 2019-12-28 ENCOUNTER — Encounter: Payer: Self-pay | Admitting: Vascular Surgery

## 2019-12-28 ENCOUNTER — Ambulatory Visit (INDEPENDENT_AMBULATORY_CARE_PROVIDER_SITE_OTHER): Payer: Self-pay | Admitting: Vascular Surgery

## 2019-12-28 VITALS — BP 167/91 | HR 53 | Resp 18 | Ht 63.0 in | Wt 139.0 lb

## 2019-12-28 DIAGNOSIS — N186 End stage renal disease: Secondary | ICD-10-CM

## 2019-12-28 DIAGNOSIS — N184 Chronic kidney disease, stage 4 (severe): Secondary | ICD-10-CM

## 2019-12-28 NOTE — Progress Notes (Signed)
Patient name: Christopher Davila MRN: [id number] DOB: 1970-11-09 Sex: male  REASON FOR VISIT: Follow-up access for hemodialysis  HPI: Christopher Davila is a 49 y.o. 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  He is here today with the caregivers from his nursing facility.  Apparently he is having no difficulty with his catheter.  Current Outpatient Medications  Medication Sig Dispense Refill  . acetaminophen (TYLENOL) 500 MG tablet Take 1,000 mg by mouth every 6 (six) hours as needed for mild pain or moderate pain.    Marland Kitchen amLODipine (NORVASC) 10 MG tablet Take 1 tablet (10 mg total) by mouth daily.    Marland Kitchen aspirin EC 81 MG tablet Take 1 tablet (81 mg total) by mouth daily.    . brimonidine (ALPHAGAN P) 0.1 % SOLN Place 1 drop into the left eye 2 (two) times daily.     . calcium acetate (PHOSLO) 667 MG capsule Take 1 capsule (667 mg total) by mouth 3 (three) times daily with meals.    . Darbepoetin Alfa (ARANESP) 60 MCG/0.3ML SOSY injection Inject 0.3 mLs (60 mcg total) into the vein every Tuesday with hemodialysis. 4.2 mL   . dorzolamide-timolol (COSOPT) 22.3-6.8 MG/ML ophthalmic solution Place 1 drop into the left eye 2 (two) times daily. 10 mL 12  . gabapentin (NEURONTIN) 300 MG capsule Take 1 capsule (300 mg total) by mouth 2 (two) times daily. (Patient taking differently: Take 300 mg by mouth 3 (three) times daily. )    . glipiZIDE (GLUCOTROL) 10 MG tablet Take 1 tablet (10 mg total) by mouth 2 (two) times daily.    . hydrALAZINE (APRESOLINE) 25 MG tablet Take 0.5 tablets (12.5 mg total) by mouth 3 (three) times daily.    Marland Kitchen latanoprost (XALATAN) 0.005 % ophthalmic solution Place 1 drop into the left eye at bedtime. 2.5 mL 12  . losartan (COZAAR) 100 MG tablet Take 1 tablet (100 mg total) by mouth daily.    Marland Kitchen moxifloxacin (VIGAMOX) 0.5 % ophthalmic solution Place 1 drop into the left eye 2 (two) times  daily.     . sodium bicarbonate 650 MG tablet Take 1 tablet (650 mg total) by mouth 3 (three) times daily. 90 tablet 0  . torsemide (DEMADEX) 100 MG tablet Take 1 tablet (100 mg total) by mouth daily.     No current facility-administered medications for this visit.     PHYSICAL EXAM: Vitals:   12/28/19 1535  BP: (!) 167/91  Pulse: (!) 53  Resp: 18  SpO2: 98%  Weight: 139 lb (63 kg)  Height: 5\' 3"  (1.6 m)    GENERAL: The patient is a well-nourished male, in no acute distress. The vital signs are documented above. His right antecubital incision is well-healed.  He has an excellent thrill  Duplex shows a widely patent fistula with no evidence of stenosis  MEDICAL ISSUES: Stable status post for stage basilic vein to brachial artery fistula on 514.  He will be scheduled for second stage transposition.  I described the procedure in detail the patient who understands.  We will schedule this as an outpatient   Rosetta Posner, MD [facility] [phone] Pager [phone]  Addendum:  The patient has been re-examined and re-evaluated.  The patient's history and physical has been reviewed and is unchanged.    Christopher Davila is a 49 y.o. male is being admitted with No admission  diagnoses are documented for this encounter.. All the risks, benefits and other treatment options have been discussed with the patient. The patient has consented to proceed with  as a surgical intervention.  Mimi Debellis 01/05/2020 10:37 AM Vascular and Vein Surgery

## 2020-01-04 ENCOUNTER — Encounter (HOSPITAL_COMMUNITY): Payer: Self-pay | Admitting: Vascular Surgery

## 2020-01-04 NOTE — Pre-Procedure Instructions (Signed)
°   English [place]  01/04/2020    Mr. Stamper procedure is scheduled on 01/05/20 at 11:10 AM.   Report to [facility] Entrance "A" Admitting Office at 8:40 AM.   Call this number if you have problems the morning of surgery: [phone]    Remember:  Patient is not to eat or drink after midnight tonight.  Take these medicines the morning of surgery with A SIP OF WATER: Amlodipine (Norvasc), Aspirin, Gabapentin (Neurontin), Hydralazine (Apresoline), Acetaminophen (Tylenol) - prn, may use eye drops  Do not give patient his Glipizide dose this evening or in the AM prior to surgery. Please check his blood sugar in the AM when he gets up and every 2 hours until he leaves for the hospital.  If blood sugar is 70 or below, treat with 1/2 cup of clear juice (apple or cranberry) and recheck blood sugar 15 minutes after drinking juice. If blood sugar continues to be 70 or below, call the Short Stay department and ask to speak to a nurse.  Per instructions Dr. Suzette Battiest, Anesthesiologist/Gwendolen Hewlett Stan Head RN    Do not wear jewelry.  Do not wear lotions, powders, cologne or deodorant.  Men may shave face and neck.  Do not bring valuables to the hospital.  [facility] is not responsible for any belongings or valuables.  Contacts, dentures or bridgework may not be worn into surgery.  Leave your suitcase in the car.  After surgery it may be brought to your room.  For patients admitted to the hospital, discharge time will be determined by your treatment team.  Patients discharged the day of surgery will not be allowed to drive home.   Any questions today, please call Lilia Pro, RN at [phone]

## 2020-01-04 NOTE — Anesthesia Preprocedure Evaluation (Deleted)
Anesthesia Evaluation  Patient identified by MRN, date of birth, ID band Patient awake    Reviewed: Allergy & Precautions, NPO status , Patient's Chart, lab work & pertinent test results  History of Anesthesia Complications Negative for: history of anesthetic complications  Airway        Dental   Pulmonary former smoker,           Cardiovascular hypertension,      Neuro/Psych    GI/Hepatic   Endo/Other  diabetes  Renal/GU      Musculoskeletal   Abdominal   Peds  Hematology   Anesthesia Other Findings   Reproductive/Obstetrics                            Anesthesia Physical Anesthesia Plan  ASA:   Anesthesia Plan:    Post-op Pain Management:    Induction:   PONV Risk Score and Plan:   Airway Management Planned:   Additional Equipment:   Intra-op Plan:   Post-operative Plan:   Informed Consent:   Plan Discussed with:   Anesthesia Plan Comments: (PAT note written 01/04/2020 by Myra Gianotti, PA-C. SAME DAY WORK-UP   )        Anesthesia Quick Evaluation                                  Anesthesia Evaluation  Patient identified by MRN, date of birth, ID bandGeneral Assessment Comment:Patient awake  Reviewed: Allergy & Precautions, NPO status , Patient's Chart, lab work & pertinent test results  Airway Mallampati: III  TM Distance: >3 FB Neck ROM: Full    Dental  (+) Poor Dentition, Missing,    Pulmonary former smoker,    Pulmonary exam normal breath sounds clear to auscultation       Cardiovascular hypertension, Pt. on home beta blockers Normal cardiovascular exam Rhythm:Regular Rate:Normal  ECG: SR, rate 53   Neuro/Psych Blindness  Neuromuscular disease negative psych ROS   GI/Hepatic negative GI ROS, Neg liver ROS,   Endo/Other  diabetes, Oral Hypoglycemic Agents  Renal/GU ESRF and DialysisRenal disease      Musculoskeletal negative musculoskeletal ROS (+)   Abdominal (+) + obese,   Peds  Hematology  (+) anemia ,   Anesthesia Other Findings END STAGE RENAL DISEASE  Reproductive/Obstetrics                            Anesthesia Physical Anesthesia Plan  ASA: IV  Anesthesia Plan: General   Post-op Pain Management:    Induction: Intravenous  PONV Risk Score and Plan: 2 and Ondansetron, Dexamethasone and Treatment may vary due to age or medical condition  Airway Management Planned: LMA  Additional Equipment:   Intra-op Plan:   Post-operative Plan: Extubation in OR  Informed Consent: I have reviewed the patients History and Physical, chart, labs and discussed the procedure including the risks, benefits and alternatives for the proposed anesthesia with the patient or authorized representative who has indicated his/her understanding and acceptance.     Dental advisory given  Plan Discussed with: CRNA  Anesthesia Plan Comments:        Anesthesia Quick Evaluation

## 2020-01-04 NOTE — Progress Notes (Addendum)
Pt is a resident at [facility]. Spoke with Berneta Sages, LPN for pre-op call. Pt is a diabetic. Last A1C was 6.4 on 09/19/19. Berneta Sages states pt has "lows" during the night with his blood sugar. He states pt is full care due to him being totally blind. He states he can walk with a walker with assistance. Otherwise he is alert, oriented and can speak for himself.   Pt will need to be tested for Covid on arrival. Updated [facility] that pt will need to arrive at 8:00 AM (3 hours prior to surgery start time). Spoke with Otila Kluver at [employer].  He will arrive via SNF's transportation in a wheelchair.  Pre-op instructions faxed to [facility] at [phone].

## 2020-01-04 NOTE — Progress Notes (Signed)
Anesthesia Chart Review: Christopher Davila   Case: [id number] Date/Time: 01/05/20 1054   Procedure: RIGHT ARM SECOND STAGE BASCILIC VEIN TRANSPOSITION (Right )   Anesthesia type: Monitor Anesthesia Care   Pre-op diagnosis: END STAGE RENAL DISEASE   Location: [facility] OR ROOM 46 / [place] OR   Surgeons: Rosetta Posner, MD      DISCUSSION: Patient is a 49 year old male scheduled for the above procedure.  History includes former smoker (quit 03/22/15), DM2 (with gastroparesis), HTN, pancreatitis (06/2012), legally blind (08/2019), ESRD (started hemodialysis 10/28/19).  - Hospitalized 10/13/19-11/19/19 for diarrhea, acute respiratory failure with hypoxia due to acute on chronic diastolic CHF in setting of progression to ESRD. GI panel and C. Difficile negative. He underwent insertion of right IJ tunneled hemodialysis catheter on 10/28/19 and was started on HD. S/p creation of right brachiobasilic AVF on 9/93/71. Disposition was complicated by homelessness and need to arrange out-patient hemodialysis. S/p PRBC 11/06/19.   - Hospitalized 09/19/19-09/24/19 for left eye vision loss, hypertensive urgency (BP 200/130), AKI, hyperglycemia (> 600). Ophthalmologist Mellody Life, MD consulted. Left ocular pressure 51, and he recommended transfer to [facility] for potential laser procedure. He was seen at [facility] and was scheduled for Diode Laser Cyclophotocoagulation on 09/28/19, but readmitted to [facility] on 09/25/19-09/27/19 with acute on chronic diastolic CHF in setting of CKD stage IV and bilateral pulmonary infiltrates on xray.) Left eye surgery was cancelled due to recent admission. VQ scan negative for PE.   - Hospitalized 06/15/19-06/17/19 for right vision loss (sudden onset after progressive loss over several months) with hypertensive urgency. Ophthalmology recommended out-patient follow-up for suspected hypertensive versus diabetic retinopathy. Neurology recommended brain MRI which was negative for CVA.   - Hospitalized  04/16/19-04/18/19 for dyspnea, fatigue with acute on chronic kidney disease.  - Hospitalization 02/12/19-02/20/19 for AKI with nephrotic syndrome. He initially presented with dyspnea, abdominal pain, cough, significant edema. He required equired supplemental O2. BNP 567. D-dimer and troponins elevated. Echo showed normal LVEF, VQ scan negative, and troponins stabilized. He required diuresis and HTN medication adjustments. Cardiology curb-sided and did not feel symptoms were due to cardiac etiology. He was ultimately diagnosed with nephrotic syndrome and nephrology consulted. Etiology thought to be likely due to to uncontrolled diabetic nephropathy also with hypertension.  Renal biopsy 02/18/19 showed diabetic nephropathy with nodule glomerulosclerosis, moderate-severe interstitial fibrosis and tubular atrophy, focal and segmental glomerular tuft scarring, sever arteriolar hyalinosis. He was counseled on abstinence/moderation of alcohol.   Per Special Needs in posting: "PT IS LEGALLY BLIND Best contact number is for [place] at [facility] Pt will complete Covid test at [facility] and bring results". Anesthesia team can review results, and if he requires an additional test then that will need to be done on the day of surgery.    VS: BP Readings from Last 3 Encounters:  12/28/19 (!) 167/91  11/19/19 (!) 141/86  10/05/19 (!) 138/91   Pulse Readings from Last 3 Encounters:  12/28/19 (!) 53  11/19/19 69  10/05/19 88    PROVIDERS: Place[place], Audrea Muscat, NP is listed as PCP   LABS: For labs day of surgery.  As of 11/18/19-11/19/2019, creatinine 3.03, glucose 157, hemoglobin 9.7, hematocrit 31.3, platelet count 139. A1c 6.4% on 09/19/19.   IMAGES: 1V CXR 11/11/19 (in setting of acute on chronic diastolic HF/progression ESRD):  FINDINGS: RIGHT jugular line with tip projecting over SVC. Upper normal heart size. Stable mediastinal contours. BILATERAL pleural effusions and pulmonary edema again  identified. No pneumothorax or  acute osseous findings. IMPRESSION: Persistent pulmonary edema and BILATERAL pleural effusions.  CT Head 10/27/19: IMPRESSION: 1. No acute intracranial abnormality. 2. Stable sequela of remote left basal ganglia infarct. 3. Stable parenchymal volume loss and chronic microvascular angiopathy. 4. Small layering air-fluid level in the left sphenoid sinus, could assess for clinical symptoms of acute sinusitis. 5. Periodontal disease, could correlate with outpatient dental examination   EKG: 10/13/19: Sinus rhythm Nonspecific intraventricular conduction delay Confirmed by Gerlene Fee [phone]) on 10/13/2019 10:15:23 AM - Inferolateral ST abnormality, may be due to baseline wanderer or repolarization abnormality   CV: Echo 02/13/19: IMPRESSIONS  1. The left ventricle has normal systolic function, with an ejection  fraction of 55-60%. The cavity size was normal. Left ventricular diastolic  Doppler parameters are consistent with pseudonormalization. Indeterminate  filling pressures.  2. The right ventricle has normal systolic function. The cavity was  normal. There is no increase in right ventricular wall thickness. Right  ventricular systolic pressure is normal.  3. The pericardial effusion is circumferential.  4. Trivial pericardial effusion is present.  5. No evidence of mitral valve stenosis.  6. The aortic valve is tricuspid. No stenosis of the aortic valve.  7. The aorta is normal unless otherwise noted.   Nuclear stress test 05/24/15:  No T wave inversion was noted during stress.  There was no ST segment deviation noted during stress.  The study is normal. No evidence of ischemia. No infarction  This is a low risk study.  Nuclear stress EF: 53%.   Past Medical History:  Diagnosis Date  . Abdominal pain 12/28/2012  . Acute kidney injury superimposed on chronic kidney disease ([place])   . Blindness 09/04/2019  . Chest pain 02/14/2014   . Diabetes mellitus   . Diarrhea 10/14/2019  . Dyspnea   . Epigastric abdominal pain   . Gastroparesis due to DM ([place])   . Hypertension   . Hypoalbuminemia 04/17/2019  . Hypoxia   . Nausea   . Neuropathy of lower extremity   . Oral candidiasis 06/26/2012  . Pancreatitis 06/26/2012  . Weight loss 06/26/2012  . Wheezing     Past Surgical History:  Procedure Laterality Date  . AV FISTULA PLACEMENT Right 10/29/2019   Procedure: RIGHT ARM BRACHIOBASILIC ARTERIOVENOUS (AV) FISTULA CREATION;  Surgeon: Rosetta Posner, MD;  Location: [place];  Service: Vascular;  Laterality: Right;  . EYE SURGERY    . IR FLUORO GUIDE CV LINE RIGHT  10/28/2019  . IR US GUIDE VASC ACCESS RIGHT  10/28/2019    MEDICATIONS: No current facility-administered medications for this encounter.   Marland Kitchen acetaminophen (TYLENOL) 500 MG tablet  . Amino Acids-Protein Hydrolys (FEEDING SUPPLEMENT, PRO-STAT SUGAR FREE 64,) LIQD  . amLODipine (NORVASC) 10 MG tablet  . aspirin EC 81 MG tablet  . brimonidine (ALPHAGAN) 0.2 % ophthalmic solution  . calcium acetate (PHOSLO) 667 MG capsule  . dorzolamide-timolol (COSOPT) 22.3-6.8 MG/ML ophthalmic solution  . gabapentin (NEURONTIN) 400 MG capsule  . glipiZIDE (GLUCOTROL) 10 MG tablet  . hydrALAZINE (APRESOLINE) 25 MG tablet  . latanoprost (XALATAN) 0.005 % ophthalmic solution  . losartan (COZAAR) 100 MG tablet  . methocarbamol (ROBAXIN) 750 MG tablet  . moxifloxacin (VIGAMOX) 0.5 % ophthalmic solution  . sodium bicarbonate 650 MG tablet  . torsemide (DEMADEX) 100 MG tablet  . Darbepoetin Alfa (ARANESP) 60 MCG/0.3ML SOSY injection  . gabapentin (NEURONTIN) 300 MG capsule    Myra Gianotti, PA-C Surgical Short Stay/Anesthesiology [facility] Phone [phone] [facility] Phone [phone]  01/04/2020 12:11 PM

## 2020-01-05 ENCOUNTER — Ambulatory Visit (HOSPITAL_COMMUNITY): Payer: Medicaid Other | Admitting: Vascular Surgery

## 2020-01-05 ENCOUNTER — Ambulatory Visit (HOSPITAL_COMMUNITY)
Admission: RE | Admit: 2020-01-05 | Discharge: 2020-01-05 | Disposition: A | Discharge status: Home / Self Care | Payer: Medicaid Other | Source: Ambulatory Visit | Attending: Vascular Surgery | Admitting: Vascular Surgery

## 2020-01-05 ENCOUNTER — Encounter (HOSPITAL_COMMUNITY)
Admission: RE | Disposition: A | Discharge status: Home / Self Care | Payer: Self-pay | Source: Ambulatory Visit | Attending: Vascular Surgery

## 2020-01-05 ENCOUNTER — Encounter (HOSPITAL_COMMUNITY): Payer: Self-pay | Admitting: Vascular Surgery

## 2020-01-05 ENCOUNTER — Other Ambulatory Visit: Payer: Self-pay

## 2020-01-05 DIAGNOSIS — Z20822 Contact with and (suspected) exposure to covid-19: Secondary | ICD-10-CM | POA: Diagnosis not present

## 2020-01-05 DIAGNOSIS — Z992 Dependence on renal dialysis: Secondary | ICD-10-CM | POA: Insufficient documentation

## 2020-01-05 DIAGNOSIS — N186 End stage renal disease: Secondary | ICD-10-CM | POA: Insufficient documentation

## 2020-01-05 DIAGNOSIS — E1122 Type 2 diabetes mellitus with diabetic chronic kidney disease: Secondary | ICD-10-CM | POA: Insufficient documentation

## 2020-01-05 DIAGNOSIS — E1143 Type 2 diabetes mellitus with diabetic autonomic (poly)neuropathy: Secondary | ICD-10-CM | POA: Insufficient documentation

## 2020-01-05 DIAGNOSIS — I120 Hypertensive chronic kidney disease with stage 5 chronic kidney disease or end stage renal disease: Secondary | ICD-10-CM | POA: Diagnosis not present

## 2020-01-05 DIAGNOSIS — Z7984 Long term (current) use of oral hypoglycemic drugs: Secondary | ICD-10-CM | POA: Insufficient documentation

## 2020-01-05 DIAGNOSIS — K3184 Gastroparesis: Secondary | ICD-10-CM | POA: Insufficient documentation

## 2020-01-05 DIAGNOSIS — Z7982 Long term (current) use of aspirin: Secondary | ICD-10-CM | POA: Diagnosis not present

## 2020-01-05 DIAGNOSIS — Z87891 Personal history of nicotine dependence: Secondary | ICD-10-CM | POA: Diagnosis not present

## 2020-01-05 DIAGNOSIS — N185 Chronic kidney disease, stage 5: Secondary | ICD-10-CM

## 2020-01-05 DIAGNOSIS — H548 Legal blindness, as defined in USA: Secondary | ICD-10-CM | POA: Diagnosis not present

## 2020-01-05 DIAGNOSIS — Z79899 Other long term (current) drug therapy: Secondary | ICD-10-CM | POA: Diagnosis not present

## 2020-01-05 HISTORY — DX: Anemia, unspecified: D64.9

## 2020-01-05 HISTORY — PX: BASCILIC VEIN TRANSPOSITION: SHX5742

## 2020-01-05 HISTORY — DX: Heart failure, unspecified: I50.9

## 2020-01-05 LAB — POCT I-STAT, CHEM 8
BUN: 33 mg/dL — ABNORMAL HIGH (ref 6–20)
Calcium, Ion: 1.01 mmol/L — ABNORMAL LOW (ref 1.15–1.40)
Chloride: 95 mmol/L — ABNORMAL LOW (ref 98–111)
Creatinine, Ser: 3 mg/dL — ABNORMAL HIGH (ref 0.61–1.24)
Glucose, Bld: 95 mg/dL (ref 70–99)
HCT: 35 % — ABNORMAL LOW (ref 39.0–52.0)
Hemoglobin: 11.9 g/dL — ABNORMAL LOW (ref 13.0–17.0)
Potassium: 3.8 mmol/L (ref 3.5–5.1)
Sodium: 138 mmol/L (ref 135–145)
TCO2: 32 mmol/L (ref 22–32)

## 2020-01-05 LAB — GLUCOSE, CAPILLARY
Glucose-Capillary: 93 mg/dL (ref 70–99)
Glucose-Capillary: 88 mg/dL (ref 70–99)

## 2020-01-05 LAB — SARS CORONAVIRUS 2 BY RT PCR (HOSPITAL ORDER, PERFORMED IN ~~LOC~~ HOSPITAL LAB): SARS Coronavirus 2: NEGATIVE

## 2020-01-05 SURGERY — TRANSPOSITION, VEIN, BASILIC
Anesthesia: General | Laterality: Right

## 2020-01-05 MED ORDER — PROPOFOL 10 MG/ML IV BOLUS
INTRAVENOUS | Status: DC | PRN
  Administered 2020-01-05: 180 mg via INTRAVENOUS

## 2020-01-05 MED ORDER — LIDOCAINE 2% (20 MG/ML) 5 ML SYRINGE
INTRAMUSCULAR | Status: DC | PRN
  Administered 2020-01-05: 100 mg via INTRAVENOUS

## 2020-01-05 MED ORDER — FENTANYL CITRATE (PF) 100 MCG/2ML IJ SOLN: 25.0000 ug | INTRAMUSCULAR | Status: DC | PRN

## 2020-01-05 MED ORDER — SODIUM CHLORIDE 0.9 % IV SOLN
INTRAVENOUS | Status: DC | PRN
  Administered 2020-01-05: 12:00:00 500 mL

## 2020-01-05 MED ORDER — LIDOCAINE 2% (20 MG/ML) 5 ML SYRINGE
INTRAMUSCULAR | Status: AC
Start: 2020-01-05 — End: ?
  Filled 2020-01-05: qty 5

## 2020-01-05 MED ORDER — PROMETHAZINE HCL 25 MG/ML IJ SOLN: 6.2500 mg | INTRAMUSCULAR | Status: DC | PRN

## 2020-01-05 MED ORDER — ACETAMINOPHEN 500 MG PO TABS: 1000.0000 mg | ORAL_TABLET | Freq: Once | ORAL | Status: DC

## 2020-01-05 MED ORDER — LIDOCAINE-EPINEPHRINE 0.5 %-1:200000 IJ SOLN
INTRAMUSCULAR | Status: AC
  Filled 2020-01-05: qty 1

## 2020-01-05 MED ORDER — SODIUM CHLORIDE 0.9 % IV SOLN: INTRAVENOUS | Status: DC

## 2020-01-05 MED ORDER — ACETAMINOPHEN 10 MG/ML IV SOLN: 1000.0000 mg | Freq: Once | INTRAVENOUS | Status: DC | PRN

## 2020-01-05 MED ORDER — ONDANSETRON HCL 4 MG/2ML IJ SOLN
INTRAMUSCULAR | Status: AC
  Filled 2020-01-05: qty 2

## 2020-01-05 MED ORDER — CELECOXIB 200 MG PO CAPS
ORAL_CAPSULE | ORAL | Status: AC
  Administered 2020-01-05: 200 mg via ORAL
  Filled 2020-01-05: qty 1

## 2020-01-05 MED ORDER — LABETALOL HCL 5 MG/ML IV SOLN
5.0000 mg | Freq: Once | INTRAVENOUS | Status: AC
  Administered 2020-01-05: 5 mg via INTRAVENOUS

## 2020-01-05 MED ORDER — PROTAMINE SULFATE 10 MG/ML IV SOLN
INTRAVENOUS | Status: AC
  Filled 2020-01-05: qty 10

## 2020-01-05 MED ORDER — LIDOCAINE 2% (20 MG/ML) 5 ML SYRINGE
INTRAMUSCULAR | Status: AC
  Filled 2020-01-05: qty 5

## 2020-01-05 MED ORDER — CHLORHEXIDINE GLUCONATE 0.12 % MT SOLN
15.0000 mL | Freq: Once | OROMUCOSAL | Status: AC
  Administered 2020-01-05: 15 mL via OROMUCOSAL

## 2020-01-05 MED ORDER — ROCURONIUM BROMIDE 10 MG/ML (PF) SYRINGE
PREFILLED_SYRINGE | INTRAVENOUS | Status: AC
  Filled 2020-01-05: qty 10

## 2020-01-05 MED ORDER — FENTANYL CITRATE (PF) 250 MCG/5ML IJ SOLN
INTRAMUSCULAR | Status: AC
  Filled 2020-01-05: qty 5

## 2020-01-05 MED ORDER — EPHEDRINE 5 MG/ML INJ
INTRAVENOUS | Status: AC
  Filled 2020-01-05: qty 10

## 2020-01-05 MED ORDER — CHLORHEXIDINE GLUCONATE 4 % EX LIQD: 60.0000 mL | Freq: Once | CUTANEOUS | Status: DC

## 2020-01-05 MED ORDER — CEFAZOLIN SODIUM-DEXTROSE 2-4 GM/100ML-% IV SOLN
2.0000 g | INTRAVENOUS | Status: AC
  Administered 2020-01-05: 2 g via INTRAVENOUS

## 2020-01-05 MED ORDER — HEPARIN SODIUM (PORCINE) 1000 UNIT/ML IJ SOLN
INTRAMUSCULAR | Status: AC
  Filled 2020-01-05: qty 2

## 2020-01-05 MED ORDER — MIDAZOLAM HCL 2 MG/2ML IJ SOLN
INTRAMUSCULAR | Status: AC
  Filled 2020-01-05: qty 2

## 2020-01-05 MED ORDER — ONDANSETRON HCL 4 MG/2ML IJ SOLN: 4.0000 mg | Freq: Once | INTRAMUSCULAR | Status: DC | PRN

## 2020-01-05 MED ORDER — HYDROMORPHONE HCL 1 MG/ML IJ SOLN: 0.2500 mg | INTRAMUSCULAR | Status: DC | PRN

## 2020-01-05 MED ORDER — PHENYLEPHRINE HCL-NACL 10-0.9 MG/250ML-% IV SOLN
INTRAVENOUS | Status: DC | PRN
Start: 2020-01-05 — End: 2020-01-05
  Administered 2020-01-05: 25 ug/min via INTRAVENOUS

## 2020-01-05 MED ORDER — CEFAZOLIN SODIUM-DEXTROSE 2-4 GM/100ML-% IV SOLN
INTRAVENOUS | Status: AC
  Filled 2020-01-05: qty 100

## 2020-01-05 MED ORDER — FENTANYL CITRATE (PF) 100 MCG/2ML IJ SOLN
INTRAMUSCULAR | Status: DC | PRN
  Administered 2020-01-05 (×2): 25 ug via INTRAVENOUS

## 2020-01-05 MED ORDER — 0.9 % SODIUM CHLORIDE (POUR BTL) OPTIME
TOPICAL | Status: DC | PRN
  Administered 2020-01-05: 1000 mL

## 2020-01-05 MED ORDER — PHENYLEPHRINE 40 MCG/ML (10ML) SYRINGE FOR IV PUSH (FOR BLOOD PRESSURE SUPPORT)
PREFILLED_SYRINGE | INTRAVENOUS | Status: DC | PRN
  Administered 2020-01-05 (×2): 80 ug via INTRAVENOUS
  Administered 2020-01-05: 40 ug via INTRAVENOUS

## 2020-01-05 MED ORDER — PHENYLEPHRINE 40 MCG/ML (10ML) SYRINGE FOR IV PUSH (FOR BLOOD PRESSURE SUPPORT)
PREFILLED_SYRINGE | INTRAVENOUS | Status: AC
  Filled 2020-01-05: qty 30

## 2020-01-05 MED ORDER — OXYCODONE-ACETAMINOPHEN 5-325 MG PO TABS: 1.0000 | ORAL_TABLET | ORAL | 0 refills | Status: AC | PRN

## 2020-01-05 MED ORDER — SODIUM CHLORIDE 0.9 % IV SOLN
INTRAVENOUS | Status: AC
  Filled 2020-01-05: qty 1.2

## 2020-01-05 MED ORDER — PROPOFOL 10 MG/ML IV BOLUS
INTRAVENOUS | Status: AC
  Filled 2020-01-05: qty 20

## 2020-01-05 MED ORDER — ACETAMINOPHEN 500 MG PO TABS
ORAL_TABLET | ORAL | Status: AC
  Filled 2020-01-05: qty 2

## 2020-01-05 MED ORDER — LACTATED RINGERS IV SOLN: INTRAVENOUS | Status: DC | PRN

## 2020-01-05 MED ORDER — CELECOXIB 200 MG PO CAPS: 200.0000 mg | ORAL_CAPSULE | Freq: Once | ORAL | Status: AC

## 2020-01-05 MED ORDER — ONDANSETRON HCL 4 MG/2ML IJ SOLN
INTRAMUSCULAR | Status: DC | PRN
  Administered 2020-01-05: 4 mg via INTRAVENOUS

## 2020-01-05 MED ORDER — LABETALOL HCL 5 MG/ML IV SOLN
INTRAVENOUS | Status: AC
  Filled 2020-01-05: qty 4

## 2020-01-05 SURGICAL SUPPLY — 32 items
ARMBAND PINK RESTRICT EXTREMIT (MISCELLANEOUS) ×3 IMPLANT
BNDG ELASTIC 4X5.8 VLCR STR LF (GAUZE/BANDAGES/DRESSINGS) ×3 IMPLANT
BNDG GAUZE ELAST 4 BULKY (GAUZE/BANDAGES/DRESSINGS) ×3 IMPLANT
CANISTER SUCT 3000ML PPV (MISCELLANEOUS) ×3 IMPLANT
CANNULA VESSEL 3MM 2 BLNT TIP (CANNULA) ×3 IMPLANT
CLIP LIGATING EXTRA MED SLVR (CLIP) ×3 IMPLANT
CLIP LIGATING EXTRA SM BLUE (MISCELLANEOUS) ×3 IMPLANT
COVER PROBE W GEL 5X96 (DRAPES) ×3 IMPLANT
COVER WAND RF STERILE (DRAPES) ×3 IMPLANT
DECANTER SPIKE VIAL GLASS SM (MISCELLANEOUS) ×3 IMPLANT
DERMABOND ADVANCED (GAUZE/BANDAGES/DRESSINGS) ×2
DERMABOND ADVANCED .7 DNX12 (GAUZE/BANDAGES/DRESSINGS) ×1 IMPLANT
ELECT REM PT RETURN 9FT ADLT (ELECTROSURGICAL) ×3
ELECTRODE REM PT RTRN 9FT ADLT (ELECTROSURGICAL) ×1 IMPLANT
GLOVE SS BIOGEL STRL SZ 7.5 (GLOVE) ×1 IMPLANT
GLOVE SUPERSENSE BIOGEL SZ 7.5 (GLOVE) ×2
GOWN STRL REUS W/ TWL LRG LVL3 (GOWN DISPOSABLE) ×3 IMPLANT
GOWN STRL REUS W/TWL LRG LVL3 (GOWN DISPOSABLE) ×9
KIT BASIN OR (CUSTOM PROCEDURE TRAY) ×3 IMPLANT
KIT TURNOVER KIT B (KITS) ×3 IMPLANT
NS IRRIG 1000ML POUR BTL (IV SOLUTION) ×3 IMPLANT
PACK CV ACCESS (CUSTOM PROCEDURE TRAY) ×3 IMPLANT
PAD ARMBOARD 7.5X6 YLW CONV (MISCELLANEOUS) ×6 IMPLANT
SUT PROLENE 6 0 CC (SUTURE) ×3 IMPLANT
SUT SILK 2 0 SH (SUTURE) IMPLANT
SUT SILK 3 0 (SUTURE) ×3
SUT SILK 3-0 18XBRD TIE 12 (SUTURE) ×1 IMPLANT
SUT VIC AB 3-0 SH 27 (SUTURE) ×9
SUT VIC AB 3-0 SH 27X BRD (SUTURE) ×3 IMPLANT
TOWEL GREEN STERILE (TOWEL DISPOSABLE) ×3 IMPLANT
UNDERPAD 30X36 HEAVY ABSORB (UNDERPADS AND DIAPERS) ×3 IMPLANT
WATER STERILE IRR 1000ML POUR (IV SOLUTION) ×3 IMPLANT

## 2020-01-05 NOTE — Discharge Instructions (Signed)
[facility]  Discharge Instructions  AV Fistula or Graft Surgery for Dialysis Access  Please refer to the following instructions for your post-procedure care. Your surgeon or physician assistant will discuss any changes with you.  Activity  You may drive the day following your surgery, if you are comfortable and no longer taking prescription pain medication. Resume full activity as the soreness in your incision resolves.  Bathing/Showering  You may shower after you go home. Keep your incision dry for 48 hours. Do not soak in a bathtub, hot tub, or swim until the incision heals completely. You may not shower if you have a hemodialysis catheter.  Incision Care  Clean your incision with mild soap and water after 48 hours. Pat the area dry with a clean towel. You do not need a bandage unless otherwise instructed. Do not apply any ointments or creams to your incision. You may have skin glue on your incision. Do not peel it off. It will come off on its own in about one week. Your arm may swell a bit after surgery. To reduce swelling use pillows to elevate your arm so it is above your heart. Your doctor will tell you if you need to lightly wrap your arm with an ACE bandage.  Diet  Resume your normal diet. There are not special food restrictions following this procedure. In order to heal from your surgery, it is CRITICAL to get adequate nutrition. Your body requires vitamins, minerals, and protein. Vegetables are the best source of vitamins and minerals. Vegetables also provide the perfect balance of protein. Processed food has little nutritional value, so try to avoid this.  Medications  Resume taking all of your medications. If your incision is causing pain, you may take over-the counter pain relievers such as acetaminophen (Tylenol). If you were prescribed a stronger pain medication, please be aware these medications can cause nausea and constipation. Prevent  nausea by taking the medication with a snack or meal. Avoid constipation by drinking plenty of fluids and eating foods with high amount of fiber, such as fruits, vegetables, and grains.  Do not take Tylenol if you are taking prescription pain medications.  Follow up Your surgeon may want to see you in the office following your access surgery. If so, this will be arranged at the time of your surgery.  Please call us immediately for any of the following conditions:  . Increased pain, redness, drainage (pus) from your incision site . Fever of 101 degrees or higher . Severe or worsening pain at your incision site . Hand pain or numbness. .  Reduce your risk of vascular disease:  . Stop smoking. If you would like help, call [facility] at 1-800-QUIT-NOW (281)820-7510) or [place] at (309)753-8919  . Manage your cholesterol . Maintain a desired weight . Control your diabetes . Keep your blood pressure down  Dialysis  It will take several weeks to several months for your new dialysis access to be ready for use. Your surgeon will determine when it is okay to use it. Your nephrologist will continue to direct your dialysis. You can continue to use your Permcath until your new access is ready for use.   01/05/2020 Jt Brabec [id number] 04/22/1971  Surgeon(s): Early, Arvilla Meres, MD  Procedure(s): RIGHT ARM SECOND STAGE BASCILIC VEIN TRANSPOSITION   May stick graft immediately   May stick graft on designated area only:   X Do not stick right AV fistula  for 4 weeks  If you have any questions, please call the office at [phone].

## 2020-01-05 NOTE — Anesthesia Procedure Notes (Signed)
Procedure Name: LMA Insertion Date/Time: 01/05/2020 11:22 AM Performed by: Wildon Cuevas T, CRNA Pre-anesthesia Checklist: Patient identified, Emergency Drugs available and Suction available Patient Re-evaluated:Patient Re-evaluated prior to induction Oxygen Delivery Method: Circle system utilized Preoxygenation: Pre-oxygenation with 100% oxygen LMA: LMA inserted LMA Size: 4.0 Number of attempts: 2 Tube secured with: Tape Comments: LMA insertion performed by Champ Mungo, SRNA.

## 2020-01-05 NOTE — Anesthesia Postprocedure Evaluation (Signed)
Anesthesia Post Note  [name]  Procedure(s) Performed: RIGHT ARM SECOND STAGE BASCILIC VEIN TRANSPOSITION (Right )     Patient location during evaluation: PACU Anesthesia Type: General Level of consciousness: sedated Pain management: pain level controlled Vital Signs Assessment: post-procedure vital signs reviewed and stable Respiratory status: spontaneous breathing and respiratory function stable Cardiovascular status: stable Postop Assessment: no apparent nausea or vomiting Anesthetic complications: no   No complications documented.  Last Vitals:  Vitals:   01/05/20 1430 01/05/20 1445  BP: 132/88 129/86  Pulse: 81 80  Resp: (!) 24 (!) 23  Temp:    SpO2: 92% 98%    Last Pain:  Vitals:   01/05/20 1430  TempSrc:   PainSc: Asleep                 Nathen Balaban DANIEL

## 2020-01-05 NOTE — H&P (Signed)
Rosetta Posner, MD  Physician  Specialty:  Vascular Surgery  Progress Notes     Sign when Signing Visit  Creation Time:  12/28/2019 3:47 PM      Related encounter: Office Visit from 12/28/2019 in [facility]      Sign when Signing Visit                                          Patient name: Christopher Davila  MRN: [id number]        DOB: February 19, 1971          Sex: male  REASON FOR VISIT: Follow-up access for hemodialysis  HPI: Christopher Davila is a 49 y.o. 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  He is here today with the caregivers from his nursing facility.  Apparently he is having no difficulty with his catheter.        Current Outpatient Medications  Medication Sig Dispense Refill  . acetaminophen (TYLENOL) 500 MG tablet Take 1,000 mg by mouth every 6 (six) hours as needed for mild pain or moderate pain.    Marland Kitchen amLODipine (NORVASC) 10 MG tablet Take 1 tablet (10 mg total) by mouth daily.    Marland Kitchen aspirin EC 81 MG tablet Take 1 tablet (81 mg total) by mouth daily.    . brimonidine (ALPHAGAN P) 0.1 % SOLN Place 1 drop into the left eye 2 (two) times daily.     . calcium acetate (PHOSLO) 667 MG capsule Take 1 capsule (667 mg total) by mouth 3 (three) times daily with meals.    . Darbepoetin Alfa (ARANESP) 60 MCG/0.3ML SOSY injection Inject 0.3 mLs (60 mcg total) into the vein every Tuesday with hemodialysis. 4.2 mL   . dorzolamide-timolol (COSOPT) 22.3-6.8 MG/ML ophthalmic solution Place 1 drop into the left eye 2 (two) times daily. 10 mL 12  . gabapentin (NEURONTIN) 300 MG capsule Take 1 capsule (300 mg total) by mouth 2 (two) times daily. (Patient taking differently: Take 300 mg by mouth 3 (three) times daily. )    . glipiZIDE (GLUCOTROL) 10 MG tablet Take 1 tablet (10 mg total) by mouth 2 (two) times daily.    . hydrALAZINE (APRESOLINE) 25 MG tablet Take 0.5 tablets (12.5  mg total) by mouth 3 (three) times daily.    Marland Kitchen latanoprost (XALATAN) 0.005 % ophthalmic solution Place 1 drop into the left eye at bedtime. 2.5 mL 12  . losartan (COZAAR) 100 MG tablet Take 1 tablet (100 mg total) by mouth daily.    Marland Kitchen moxifloxacin (VIGAMOX) 0.5 % ophthalmic solution Place 1 drop into the left eye 2 (two) times daily.     . sodium bicarbonate 650 MG tablet Take 1 tablet (650 mg total) by mouth 3 (three) times daily. 90 tablet 0  . torsemide (DEMADEX) 100 MG tablet Take 1 tablet (100 mg total) by mouth daily.     No current facility-administered medications for this visit.     PHYSICAL EXAM:    Vitals:   12/28/19 1535  BP: (!) 167/91  Pulse: (!) 53  Resp: 18  SpO2: 98%  Weight: 139 lb (63 kg)  Height: 5\' 3"  (1.6 m)    GENERAL: The patient is a well-nourished male, in no acute distress. The vital signs are documented above. His right antecubital  incision is well-healed.  He has an excellent thrill  Duplex shows a widely patent fistula with no evidence of stenosis  MEDICAL ISSUES: Stable status post for stage basilic vein to brachial artery fistula on 514.  He will be scheduled for second stage transposition.  I described the procedure in detail the patient who understands.  We will schedule this as an outpatient   Rosetta Posner, MD [facility] [phone] Pager [phone]  Addendum: Addendum:  The patient has been re-examined and re-evaluated.  The patient's history and physical has been reviewed and is unchanged.    Christopher Davila is a 49 y.o. male is being admitted with END STAGE RENAL DISEASE. All the risks, benefits and other treatment options have been discussed with the patient. The patient has consented to proceed with Procedure(s): RIGHT ARM SECOND STAGE Whitaker as a surgical intervention.  Maeson Purohit 01/05/2020 11:17 AM [facility]   Sherren Mocha  Leif Loflin 01/05/2020 10:37 AM [facility] Vein Surgery          Routing History

## 2020-01-05 NOTE — Transfer of Care (Signed)
Immediate Anesthesia Transfer of Care Note  [name]  Procedure(s) Performed: RIGHT ARM SECOND STAGE BASCILIC VEIN TRANSPOSITION (Right )  Patient Location: PACU  Anesthesia Type:General  Level of Consciousness: drowsy  Airway & Oxygen Therapy: Patient Spontanous Breathing and Patient connected to face mask oxygen  Post-op Assessment: Report given to RN, Post -op Vital signs reviewed and stable and Patient moving all extremities  Post vital signs: Reviewed and stable  Last Vitals:  Vitals Value Taken Time  BP 120/84 01/05/20 1355  Temp    Pulse 79 01/05/20 1403  Resp 22 01/05/20 1403  SpO2 98 % 01/05/20 1403  Vitals shown include unvalidated device data.  Last Pain:  Vitals:   01/05/20 1013  TempSrc:   PainSc: 0-No pain         Complications: No complications documented.

## 2020-01-05 NOTE — Op Note (Signed)
    OPERATIVE REPORT  DATE OF SURGERY: 01/05/2020  [name], 49 y.o. male MRN: [id number]  DOB: 02-24-1971  PRE-OPERATIVE DIAGNOSIS: End-stage renal disease  POST-OPERATIVE DIAGNOSIS:  Same  PROCEDURE: Right second stage basilic vein transposition  SURGEON:  Curt Jews, M.D.  PHYSICIAN ASSISTANT: Donnal Debar  The assistant was needed for exposure and to expedite the case  ANESTHESIA: LMA  EBL: per anesthesia record  Total I/O In: 500 [I.V.:500] Out: 50 [Blood:50]  BLOOD ADMINISTERED: none  DRAINS: none  SPECIMEN: none  COUNTS CORRECT:  YES  PATIENT DISPOSITION:  PACU - hemodynamically stable  PROCEDURE DETAILS: Patient was taken up and placed supine position where the area of the right arm right axilla were prepped and draped in usual sterile fashion.  SonoSite ultrasound was used to visualize the existing basilic vein fistula.  This was marked.  The incision was made over the antecubital space and carried down to the basilic vein to brachial artery anastomosis.  Separate incision was made on the medial aspect of the upper arm to expose the basilic vein from the antecubital space to the axilla.  Multiple tributary branches were ligated with 3-0 silk ties and divided.  The vein anastomosis was again exposed.  The brachial artery was exposed proximal distal to the old anastomosis and the old anastomosis was exposed size from the brachial artery.  The vein was brought out through the tunnel through the axillary incision.  The vein was gently dilated and was of good caliber.  The vein was marked to reduce risk of twisting.  A tunnel was created from the antecubital space to the axilla and the vein was brought through the tunnel.  The vein was cut to the appropriate length and was spatulated and sewn end-to-side to the brachial artery with a running 6-0 Prolene suture.  Clamps were removed and excellent thrill was noted.  The wounds irrigated with saline.  Hemostasis  electrocautery.  The wounds were closed with 3-0 Vicryl in the subcutaneous and subcuticular tissue.  Sterile dressing was applied and the patient was transferred to the recovery in stable condition   Rosetta Posner, M.D., [facility] 01/05/2020 3:24 PM

## 2020-01-05 NOTE — Anesthesia Preprocedure Evaluation (Addendum)
Anesthesia Evaluation  Patient identified by MRN, date of birth, ID band Patient awake  General Assessment Comment:Patient awake  Reviewed: Allergy & Precautions, NPO status , Patient's Chart, lab work & pertinent test results  History of Anesthesia Complications Negative for: history of anesthetic complications  Airway Mallampati: III  TM Distance: >3 FB Neck ROM: Full    Dental  (+) Poor Dentition, Missing,    Pulmonary former smoker,    Pulmonary exam normal breath sounds clear to auscultation       Cardiovascular hypertension, Pt. on medications Normal cardiovascular exam Rhythm:Regular Rate:Normal  ECG: SR, rate 53   Neuro/Psych Blindness  Neuromuscular disease negative psych ROS   GI/Hepatic negative GI ROS, Neg liver ROS,   Endo/Other  diabetes, Oral Hypoglycemic Agents  Renal/GU ESRF and DialysisRenal disease     Musculoskeletal negative musculoskeletal ROS (+)   Abdominal (+) + obese,   Peds  Hematology  (+) anemia ,   Anesthesia Other Findings END STAGE RENAL DISEASE  Reproductive/Obstetrics                            Anesthesia Physical  Anesthesia Plan  ASA: IV  Anesthesia Plan: General   Post-op Pain Management:    Induction: Intravenous  PONV Risk Score and Plan: 2 and Ondansetron, Dexamethasone and Treatment may vary due to age or medical condition  Airway Management Planned: LMA  Additional Equipment:   Intra-op Plan:   Post-operative Plan: Extubation in OR  Informed Consent: I have reviewed the patients History and Physical, chart, labs and discussed the procedure including the risks, benefits and alternatives for the proposed anesthesia with the patient or authorized representative who has indicated his/her understanding and acceptance.     Dental advisory given  Plan Discussed with: CRNA and Anesthesiologist  Anesthesia Plan Comments:        Anesthesia Quick Evaluation

## 2020-01-06 ENCOUNTER — Encounter (HOSPITAL_COMMUNITY): Payer: Self-pay | Admitting: Vascular Surgery

## 2020-02-14 ENCOUNTER — Ambulatory Visit (INDEPENDENT_AMBULATORY_CARE_PROVIDER_SITE_OTHER): Payer: Self-pay | Admitting: Physician Assistant

## 2020-02-14 ENCOUNTER — Other Ambulatory Visit: Payer: Self-pay

## 2020-02-14 ENCOUNTER — Encounter: Payer: Self-pay | Admitting: Physician Assistant

## 2020-02-14 VITALS — BP 161/87 | HR 89 | Temp 98.4°F | Resp 20 | Ht 63.0 in | Wt 144.0 lb

## 2020-02-14 DIAGNOSIS — N186 End stage renal disease: Secondary | ICD-10-CM

## 2020-02-14 NOTE — Progress Notes (Signed)
    Postoperative Access Visit   History of Present Illness   Christopher Davila is a 49 y.o. year old male who presents for postoperative follow-up for: right second stage basilic vein transposition by Dr. Donnetta Hutching (Date: 01/05/20).  The patient's wounds are healed.  The patient denies steal symptoms.  He complains of R arm edema which has improved over the past couple of weeks.  He is dialyzing via R IJ TDC on a TThS schedule.  He is currently a resident at [place] in [facility].   Physical Examination   Vitals:   02/14/20 1102  BP: (!) 161/87  Pulse: 89  Resp: 20  Temp: 98.4 F (36.9 C)  TempSrc: Temporal  SpO2: 92%  Weight: 144 lb (65.3 kg)  Height: 5\' 3"  (1.6 m)   Body mass index is 25.51 kg/m.  right arm Incisions are healed, palpable radial pulse, hand grip is 5/5, sensation in digits is intact, palpable thrill, bruit can be auscultated; edematous R arm compared to L     Medical Decision Making   Christopher Davila is a 49 y.o. year old male who presents s/p right second stage basilic vein transposition   Patent brachiobasilic fistula without signs or symptoms of steal syndrome  The patient's access will be ready for use 02/29/20 as long as edema improves; I stressed the importance of elevation of R arm whenever possible.  Edema of R arm will likely not completely resolve until [facility] is removed  The patient's tunneled dialysis catheter can be removed when Nephrology is comfortable with the performance of the fistula  The patient may follow up on a prn basis   Dagoberto Ligas PA-C [facility] [place] Office: [facility] MD: Trula Slade

## 2020-07-26 IMAGING — US US BIOPSY
1 series · 9 of 9 positions shown · non-contrast
Comparison: Renal ultrasound-02/14/2019

INDICATION: Acute renal insufficiency of uncertain etiology. Please perform
ultrasound-guided random renal biopsy for tissue diagnostic
purposes.

EXAM:
ULTRASOUND GUIDED RENAL BIOPSY

[Series 1: us biopsy · 9 of 9 slices shown]
[im 1/9]
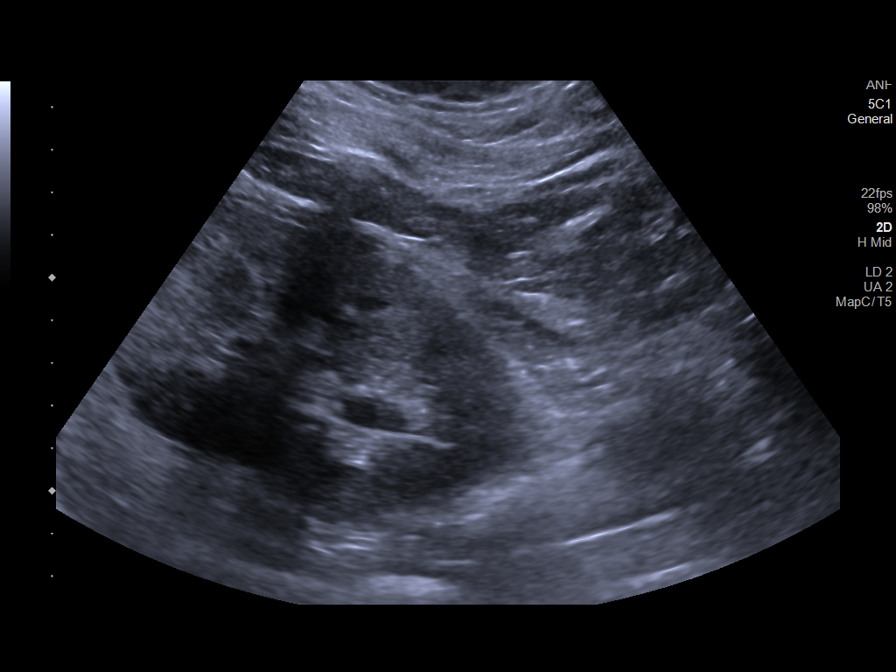
[im 2/9]
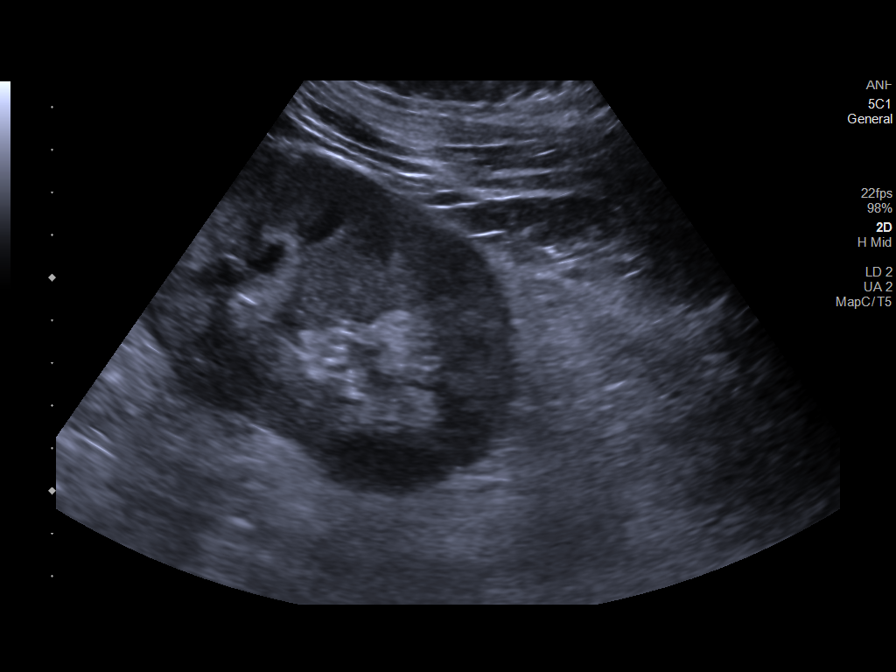
[im 3/9]
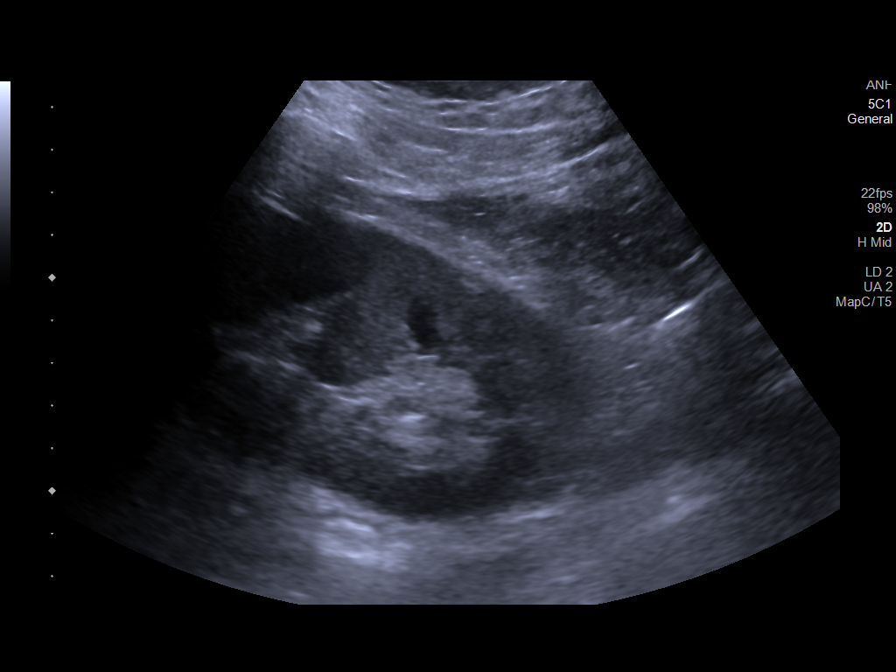
[im 4/9]
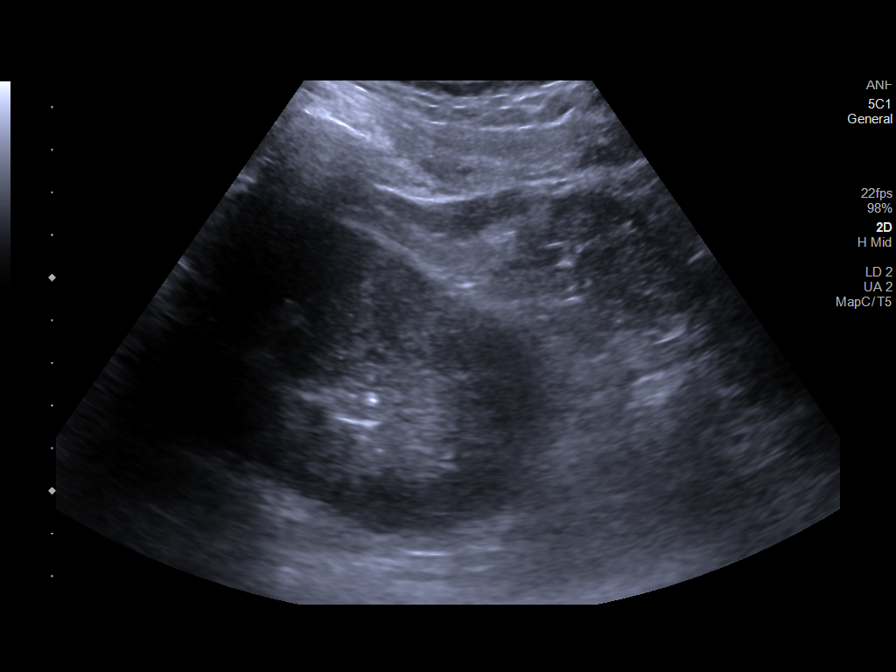
[im 5/9]
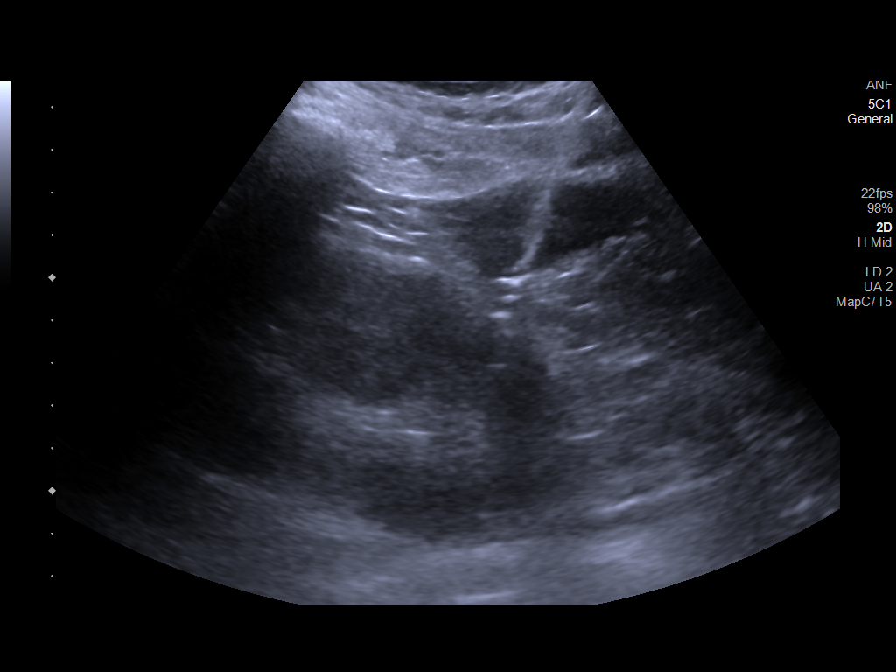
[im 6/9]
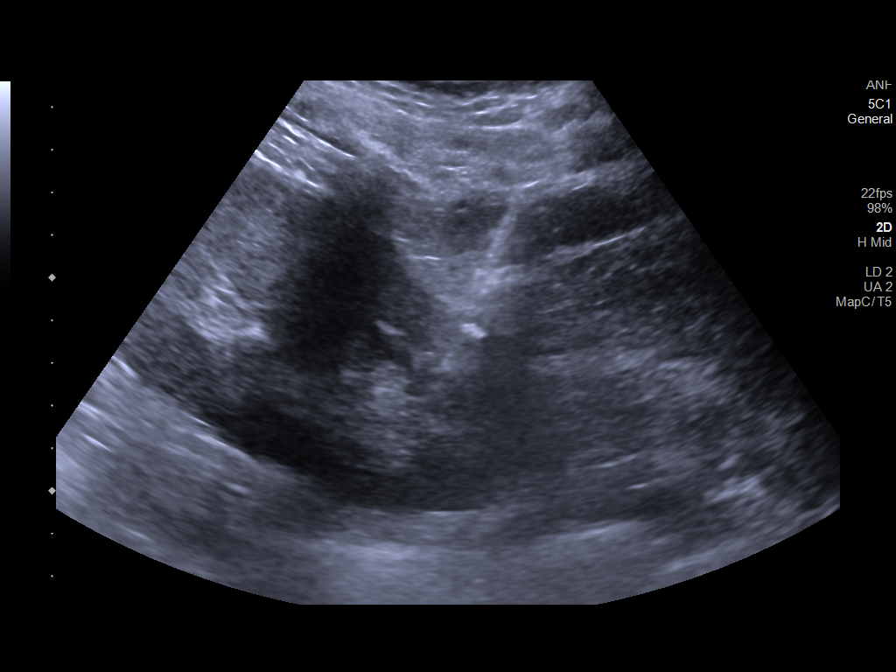
[im 7/9]
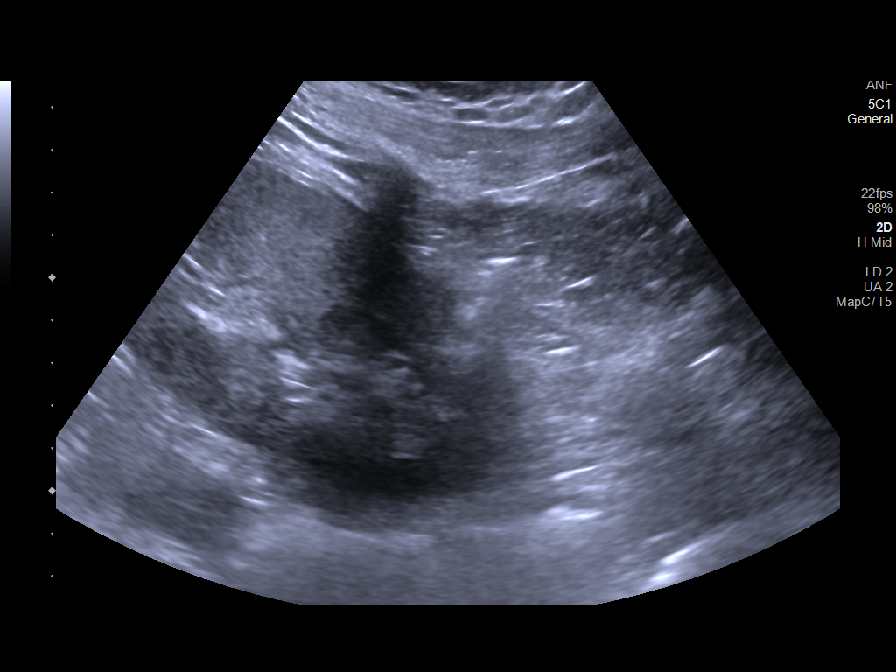
[im 8/9]
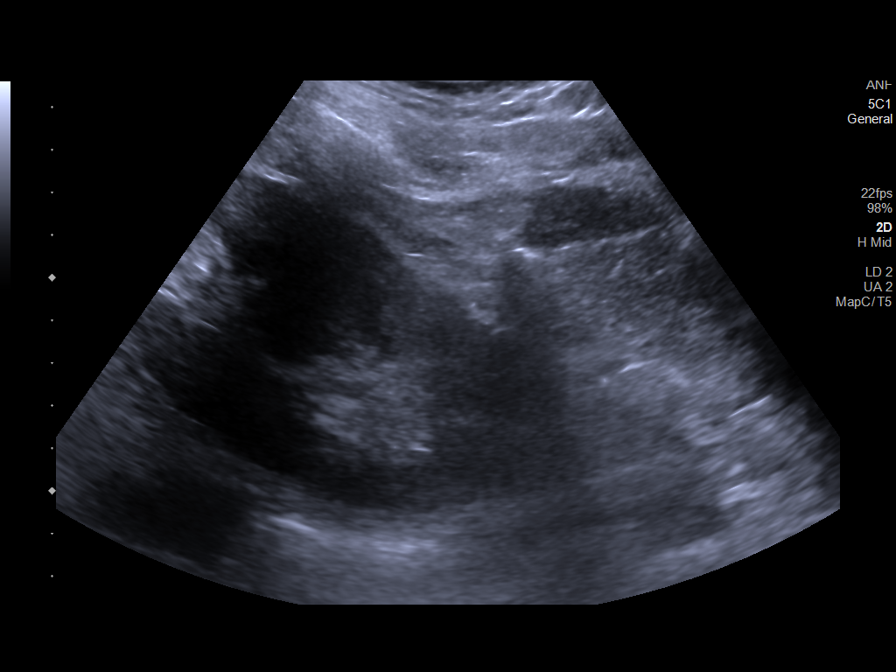
[im 9/9]
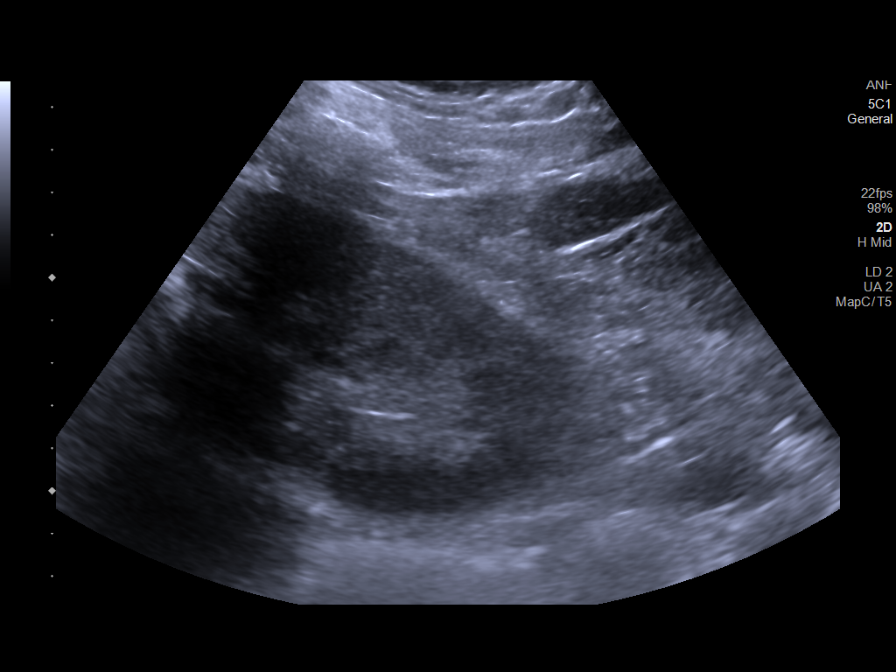

[9 of 9 positions shown; findings below may reference images not displayed]

MEDICATIONS:
None.

ANESTHESIA/SEDATION:
Fentanyl 75 mcg IV; Versed 1 mg IV

Total Moderate Sedation time: 10 minutes; The patient was
continuously monitored during the procedure by the interventional
radiology nurse under my direct supervision.

COMPLICATIONS:
None immediate.

PROCEDURE:
Informed written consent was obtained from the patient after a
discussion of the risks, benefits and alternatives to treatment. The
patient understands and consents the procedure. A timeout was
performed prior to the initiation of the procedure.

Ultrasound scanning was performed of the bilateral flanks. The
inferior pole of the left kidney was selected for biopsy due to
location and sonographic window. The procedure was planned. The
operative site was prepped and draped in the usual sterile fashion.
The overlying soft tissues were anesthetized with 1% lidocaine with
epinephrine. A 17 gauge core needle biopsy device was advanced into
the inferior cortex of the left kidney and 3 core biopsies were
obtained under direct ultrasound guidance. Images were saved for
documentation purposes. The biopsy device was removed and hemostasis
was obtained with manual compression. Post procedural scanning was
negative for significant post procedural hemorrhage or additional
complication. A dressing was placed. The patient tolerated the
procedure well without immediate post procedural complication.
IMPRESSION: Technically successful ultrasound guided left renal biopsy.

## 2020-11-15 ENCOUNTER — Other Ambulatory Visit: Payer: Self-pay | Admitting: Gerontology

## 2020-11-15 ENCOUNTER — Other Ambulatory Visit: Payer: Self-pay

## 2020-11-15 DIAGNOSIS — M25512 Pain in left shoulder: Secondary | ICD-10-CM

## 2020-11-21 NOTE — Progress Notes (Signed)
Cardiology Office Note   Date:  12/05/2020   ID:  Christopher Davila, DOB 05-17-1971, MRN [id number]  PCP:  [name], MD  Cardiologist:   Renia Mikelson [place], MD   Chief Complaint  Patient presents with   Coronary Artery Disease   Congestive Heart Failure      History of Present Illness: Christopher Davila is a 50 y.o. male who is seen at the request of [name] for evaluation of CHF. He has a history of longstanding DM with ESRD and retinopathy. Is on HD M/W/F.  Also has a history of HTN and pulmonary HTN. Previously seen by Dr Andria Frames in [place] following an episode of CHF there in Dec/Jan. He has coronary calcification noted on CT.   He currently resides at [name]. Denies any chest pain or SOB. Notes weight stable on dialysis. Activity is limited. Would like to get some PT. No edema.     Past Medical History:  Diagnosis Date   Abdominal pain 12/28/2012   Acute kidney injury superimposed on chronic kidney disease ([place])    Anemia    Blindness 09/04/2019   Chest pain 02/14/2014   CHF (congestive heart failure) (HCC)    Diabetes mellitus    Diarrhea 10/14/2019   Dyspnea    Epigastric abdominal pain    Gastroparesis due to DM ([place])    Hypertension    Hypoalbuminemia 04/17/2019   Hypoxia    Nausea    Neuropathy of lower extremity    Oral candidiasis 06/26/2012   Pancreatitis 06/26/2012   Weight loss 06/26/2012   Wheezing     Past Surgical History:  Procedure Laterality Date   AV FISTULA PLACEMENT Right 10/29/2019   Procedure: RIGHT ARM BRACHIOBASILIC ARTERIOVENOUS (AV) FISTULA CREATION;  Surgeon: Rosetta Posner, MD;  Location: [facility] OR;  Service: Vascular;  Laterality: Right;   [place] Right 01/05/2020   Procedure: RIGHT ARM SECOND STAGE [place];  Surgeon: Rosetta Posner, MD;  Location: [facility] OR;  Service: Vascular;  Laterality: Right;   EYE SURGERY     IR FLUORO GUIDE CV LINE RIGHT  10/28/2019   IR US GUIDE VASC ACCESS RIGHT   10/28/2019     Current Outpatient Medications  Medication Sig Dispense Refill   acetaminophen (TYLENOL) 500 MG tablet Take 1,000 mg by mouth every 6 (six) hours as needed for mild pain or moderate pain.     Amino Acids-Protein Hydrolys (FEEDING SUPPLEMENT, PRO-STAT SUGAR FREE 64,) LIQD Take 30 mLs by mouth 2 (two) times daily.     amLODipine (NORVASC) 10 MG tablet Take 1 tablet (10 mg total) by mouth daily.     aspirin EC 81 MG tablet Take 1 tablet (81 mg total) by mouth daily.     atorvastatin (LIPITOR) 40 MG tablet Take 1 tablet (40 mg total) by mouth daily. 90 tablet 3   atorvastatin (LIPITOR) 40 MG tablet Take 1 tablet (40 mg total) by mouth daily. 90 tablet 3   brimonidine (ALPHAGAN) 0.2 % ophthalmic solution Place 1 drop into the left eye 2 (two) times daily.     calcium acetate (PHOSLO) 667 MG capsule Take 1 capsule (667 mg total) by mouth 3 (three) times daily with meals.     Darbepoetin Alfa (ARANESP) 60 MCG/0.3ML SOSY injection Inject 0.3 mLs (60 mcg total) into the vein every Tuesday with hemodialysis. 4.2 mL    dorzolamide-timolol (COSOPT) 22.3-6.8 MG/ML ophthalmic solution Place 1 drop into the left eye  2 (two) times daily. 10 mL 12   ferrous sulfate 325 (65 FE) MG tablet Take by mouth.     gabapentin (NEURONTIN) 400 MG capsule Take 400 mg by mouth 3 (three) times daily.     glipiZIDE (GLUCOTROL) 10 MG tablet Take 1 tablet (10 mg total) by mouth 2 (two) times daily.     hydrALAZINE (APRESOLINE) 25 MG tablet Take 0.5 tablets (12.5 mg total) by mouth 3 (three) times daily.     latanoprost (XALATAN) 0.005 % ophthalmic solution Place 1 drop into the left eye at bedtime. 2.5 mL 12   lidocaine-prilocaine (EMLA) cream Apply topically.     losartan (COZAAR) 100 MG tablet Take 1 tablet (100 mg total) by mouth daily.     methocarbamol (ROBAXIN) 750 MG tablet Take 750 mg by mouth daily as needed for muscle spasms.     Methoxy PEG-Epoetin Beta (MIRCERA IJ) Mircera     moxifloxacin (VIGAMOX)  0.5 % ophthalmic solution Place 1 drop into the left eye 2 (two) times daily.      oxyCODONE-acetaminophen (PERCOCET) 5-325 MG tablet Take 1 tablet by mouth every 4 (four) hours as needed for severe pain. 20 tablet 0   potassium chloride SA (KLOR-CON) 20 MEQ tablet Take 20 mEq by mouth daily.     pregabalin (LYRICA) 25 MG capsule Take by mouth.     sodium bicarbonate 650 MG tablet Take 1 tablet (650 mg total) by mouth 3 (three) times daily. 90 tablet 0   torsemide (DEMADEX) 100 MG tablet Take 1 tablet (100 mg total) by mouth daily.     No current facility-administered medications for this visit.    Allergies:   Morphine and related    Social History:  The patient  reports that he quit smoking about 5 years ago. His smoking use included cigarettes. He has never used smokeless tobacco. He reports previous alcohol use. He reports that he does not use drugs.   Family History:  The patient's family history includes CAD in his cousin and maternal aunt; Diabetes in his maternal aunt and mother; Hypertension in his mother; Lung disease in his mother.    ROS:  Please see the history of present illness.   Otherwise, review of systems are positive for none.   All other systems are reviewed and negative.    PHYSICAL EXAM: VS:  BP (!) 142/84 (BP Location: Left Arm, Patient Position: Sitting, Cuff Size: Normal)   Pulse 87   Ht '5\' 3"'$  (1.6 m)   Wt 121 lb 9.6 oz (55.2 kg)   SpO2 99%   BMI 21.54 kg/m  , BMI Body mass index is 21.54 kg/m. GEN: Well nourished, well developed, in no acute distress  HEENT: blind Neck: no JVD, carotid bruits, or masses Cardiac: RRR; no murmurs, rubs, or gallops,no edema  Respiratory:  clear to auscultation bilaterally, normal work of breathing GI: soft, nontender, nondistended, + BS MS: no deformity or atrophy. Right arm AV fistula Skin: warm and dry, no rash Neuro:  Strength and sensation are intact Psych: euthymic mood, full affect   EKG:  EKG is ordered  today. The ekg ordered today demonstrates NSR rate 87. LVH. I have personally reviewed and interpreted this study.    Recent Labs: 01/05/2020: BUN 33; Creatinine, Ser 3.00; Hemoglobin 11.9; Potassium 3.8; Sodium 138    Lipid Panel    Component Value Date/Time   CHOL 244 (H) 02/12/2019 2238   TRIG 182 (H) 02/12/2019 2238   HDL 72 02/12/2019  2238   CHOLHDL 3.4 02/12/2019 2238   VLDL 36 02/12/2019 2238   LDLCALC 136 (H) 02/12/2019 2238      Wt Readings from Last 3 Encounters:  12/05/20 121 lb 9.6 oz (55.2 kg)  02/14/20 144 lb (65.3 kg)  01/05/20 143 lb (64.9 kg)      Other studies Reviewed: Additional studies/ records that were reviewed today include:   Echo 09/24/19:SUMMARY  The left ventricular size is normal.  There is mild concentric left ventricular hypertrophy.  Left ventricular systolic function is low normal.  LV ejection fraction = 50-55%.  LV wall motion normal. Left ventricular filling pattern is  restrictive.  The right ventricle is normal in size and function.  There is moderate mitral regurgitation.  MR ERO 0.25 cm2, regurgitant volume 39 mL, regurgitant fraction 39%.  There is mild tricuspid regurgitation.  The left atrium is moderately dilated.  Estimated right ventricular systolic pressure is 46 mmHg.  Mild pulmonary hypertension.  There is a pleural effusion present.  There is trivial pericardial effusion.  Pulmonary venous flow pattern is blunted  IVC normal size but < 50% inspiratory collapse.  Alll echo features are concerning for restrictive cardiomyopathy  There is no comparison study available.     CT CHEST WITHOUT CONTRAST   TECHNIQUE:  Multidetector CT imaging of the chest was performed following the  standard protocol without IV contrast.   COMPARISON:  CT chest, 10/05/2019   FINDINGS:  Cardiovascular: Aortic atherosclerosis. Cardiomegaly. Left coronary  artery calcification. No pericardial effusion.   Mediastinum/Nodes: Numerous  unchanged prominent mediastinal lymph  nodes. Thyroid gland, trachea, and esophagus demonstrate no  significant findings.   Lungs/Pleura: Moderate bilateral, right greater than left pleural  effusions and associated atelectasis or consolidation, increased  compared to prior CT. There is diffuse ground-glass attenuation of  the airspaces and scattered heterogeneous airspace opacity  throughout, particularly in the inferior lungs (series 5, image  74).Marland Kitchen   Upper Abdomen: No acute abnormality.   Musculoskeletal: No chest wall mass or suspicious bone lesions  identified.   IMPRESSION:  1. Moderate bilateral, right greater than left pleural effusions and  associated atelectasis or consolidation, increased compared to prior  CT.  2. There is diffuse ground-glass attenuation of the airspaces and  scattered heterogeneous airspace opacity throughout, particularly in  the inferior lungs. Findings are consistent with nonspecific  infection, edema, and/or ARDS.  3. Evaluation of the lung parenchyma for underlying fibrotic  interstitial lung disease is essentially nondiagnostic in the  setting of substantial pleural effusions and acute airspace disease.  4. Numerous unchanged prominent mediastinal lymph nodes, likely  reactive.  5. Cardiomegaly and coronary artery disease.   Aortic Atherosclerosis (ICD10-I70.0).    Electronically Signed    By: Eddie Candle M.D.    On: 07/27/2020 12:42  Procedure Note  Meriel Pica - 07/27/2020  Formatting of this note might be different from the original.  CLINICAL DATA:  Evaluate interstitial disease and pleural effusion   EXAM:  CT CHEST WITHOUT CONTRAST   TECHNIQUE:  Multidetector CT imaging of the chest was performed following the  standard protocol without IV contrast.   COMPARISON:  CT chest, 10/05/2019   FINDINGS:  Cardiovascular: Aortic atherosclerosis. Cardiomegaly. Left coronary  artery calcification. No pericardial effusion.    Mediastinum/Nodes: Numerous unchanged prominent mediastinal lymph  nodes. Thyroid gland, trachea, and esophagus demonstrate no  significant findings.   Lungs/Pleura: Moderate bilateral, right greater than left pleural  effusions and associated atelectasis or consolidation,  increased  compared to prior CT. There is diffuse ground-glass attenuation of  the airspaces and scattered heterogeneous airspace opacity  throughout, particularly in the inferior lungs (series 5, image  74).Marland Kitchen   Upper Abdomen: No acute abnormality.   Musculoskeletal: No chest wall mass or suspicious bone lesions  identified.   IMPRESSION:  1. Moderate bilateral, right greater than left pleural effusions and  associated atelectasis or consolidation, increased compared to prior  CT.  2. There is diffuse ground-glass attenuation of the airspaces and  scattered heterogeneous airspace opacity throughout, particularly in  the inferior lungs. Findings are consistent with nonspecific  infection, edema, and/or ARDS.  3. Evaluation of the lung parenchyma for underlying fibrotic  interstitial lung disease is essentially nondiagnostic in the  setting of substantial pleural effusions and acute airspace disease.  4. Numerous unchanged prominent mediastinal lymph nodes, likely  reactive.  5. Cardiomegaly and coronary artery disease.   Aortic Atherosclerosis (ICD10-I70.0).    Electronically Signed    By: Eddie Candle M.D.    On: 07/27/2020 12:42    ASSESSMENT AND PLAN:  1.  Chronic diastolic CHF. EF 50-55% in past. Volume status well controlled with dialysis. Surprisingly is still on demadex and potassium despite being on dialysis. Sodium restriction 2. Coronary calcification noted on CT. No anginal symptoms. Noted surprising given ESRD and risk factors. No further evaluation needed unless he develops anignal symptoms 3. HTN. Well controlled 4. HLD. Goal LDL < 70. Will start lipitor 40 mg daily. Request fasting lab  in 3 months 5. ESRD on HD.  6. Diabetic retinopathy with blindness.    Current medicines are reviewed at length with the patient today.  The patient does not have concerns regarding medicines.  The following changes have been made:  see above  Labs/ tests ordered today include:   Orders Placed This Encounter  Procedures   Basic metabolic panel   Lipid panel   Hepatic function panel   EKG 12-Lead     Disposition:   FU with APP in 6 months  Signed, Ledford Goodson Martinique, MD  12/05/2020 12:51 PM    [facility] [address], [place], [place], [address] Phone [phone], Fax [phone]

## 2020-11-23 ENCOUNTER — Ambulatory Visit
Admission: RE | Admit: 2020-11-23 | Discharge: 2020-11-23 | Disposition: A | Discharge status: Home / Self Care | Payer: Medicare Other | Source: Ambulatory Visit | Attending: Gerontology | Admitting: Gerontology

## 2020-11-23 ENCOUNTER — Other Ambulatory Visit: Payer: Self-pay

## 2020-11-23 DIAGNOSIS — M25512 Pain in left shoulder: Secondary | ICD-10-CM

## 2020-12-05 ENCOUNTER — Other Ambulatory Visit: Payer: Self-pay

## 2020-12-05 ENCOUNTER — Ambulatory Visit (INDEPENDENT_AMBULATORY_CARE_PROVIDER_SITE_OTHER): Payer: Medicare Other | Admitting: Cardiology

## 2020-12-05 ENCOUNTER — Encounter: Payer: Self-pay | Admitting: Cardiology

## 2020-12-05 VITALS — BP 142/84 | HR 87 | Ht 63.0 in | Wt 121.6 lb

## 2020-12-05 DIAGNOSIS — I5032 Chronic diastolic (congestive) heart failure: Secondary | ICD-10-CM | POA: Diagnosis not present

## 2020-12-05 DIAGNOSIS — E7800 Pure hypercholesterolemia, unspecified: Secondary | ICD-10-CM

## 2020-12-05 DIAGNOSIS — I10 Essential (primary) hypertension: Secondary | ICD-10-CM

## 2020-12-05 DIAGNOSIS — I2584 Coronary atherosclerosis due to calcified coronary lesion: Secondary | ICD-10-CM

## 2020-12-05 DIAGNOSIS — I2510 Atherosclerotic heart disease of native coronary artery without angina pectoris: Secondary | ICD-10-CM | POA: Diagnosis not present

## 2020-12-05 DIAGNOSIS — N186 End stage renal disease: Secondary | ICD-10-CM | POA: Diagnosis not present

## 2020-12-05 MED ORDER — ATORVASTATIN CALCIUM 40 MG PO TABS: 40.0000 mg | ORAL_TABLET | Freq: Every day | ORAL | 3 refills | Status: DC

## 2020-12-05 NOTE — Patient Instructions (Signed)
Start lipitor 40 mg daily  Check fasting lab in 3 months

## 2020-12-07 ENCOUNTER — Encounter: Payer: Self-pay | Admitting: Orthopaedic Surgery

## 2020-12-07 ENCOUNTER — Ambulatory Visit (INDEPENDENT_AMBULATORY_CARE_PROVIDER_SITE_OTHER): Payer: Medicare Other | Admitting: Orthopaedic Surgery

## 2020-12-07 DIAGNOSIS — M25512 Pain in left shoulder: Secondary | ICD-10-CM | POA: Diagnosis not present

## 2020-12-07 DIAGNOSIS — G8929 Other chronic pain: Secondary | ICD-10-CM

## 2020-12-07 MED ORDER — METHYLPREDNISOLONE ACETATE 40 MG/ML IJ SUSP
40.0000 mg | INTRAMUSCULAR | Status: AC | PRN
  Administered 2020-12-07: 40 mg via INTRA_ARTICULAR

## 2020-12-07 MED ORDER — LIDOCAINE HCL 2 % IJ SOLN
2.0000 mL | INTRAMUSCULAR | Status: AC | PRN
  Administered 2020-12-07: 2 mL

## 2020-12-07 MED ORDER — BUPIVACAINE HCL 0.25 % IJ SOLN
2.0000 mL | INTRAMUSCULAR | Status: AC | PRN
  Administered 2020-12-07: 2 mL via INTRA_ARTICULAR

## 2020-12-07 NOTE — Progress Notes (Signed)
Office Visit Note   [name]           Date of Birth: 1970-12-25           MRN: [id number] Visit Date: 12/07/2020              Requested by: Marliss Coots, NP [address] [place],  [place] [address] PCP: [name], MD   Assessment & Plan: Visit Diagnoses:  1. Chronic left shoulder pain     Plan: Impression is left shoulder subacromial bursitis.  I believe the patient's range of motion is all pain mediated and therefore I hope the injection will significantly help.  He lives at a skilled nursing facility where he gets physical therapy so I recommended the continuation of this for his shoulder.  He will follow-up with [place] as needed.  Follow-Up Instructions: Return if symptoms worsen or fail to improve.   Orders:  Orders Placed This Encounter  Procedures   Large Joint Inj    No orders of the defined types were placed in this encounter.     Procedures: Large Joint Inj: L subacromial bursa on 12/07/2020 11:31 AM Indications: pain Details: 22 G needle Medications: 2 mL lidocaine 2 %; 2 mL bupivacaine 0.25 %; 40 mg methylPREDNISolone acetate 40 MG/ML Outcome: tolerated well, no immediate complications Patient was prepped and draped in the usual sterile fashion.      Clinical Data: No additional findings.   Subjective: Chief Complaint  Patient presents with   Left Shoulder - Pain    HPI patient is a pleasant 50--year-old visually impaired gentleman who comes in today with left shoulder pain.  This began over 6 months ago when falling out of a hospital bed.  He noticed that he was falling and tried to brace himself with his left shoulder.  He has had pain to the deltoid since.  No weakness of left upper extremity.  Any movement of the shoulder as well as sleeping on the left side aggravates his symptoms.  He does have numbness and tingling to both hands but notes he has underlying diabetic neuropathy.  He did undergo MRI of the lumbar spine a few  weeks ago which was fairly unremarkable with the exception of questionable triceps strain and mild before meals and glenohumeral osteoarthritis.  The patient has not previously undergone left shoulder injection.  Review of Systems as detailed in HPI.  All others reviewed and are negative.   Objective: Vital Signs: There were no vitals taken for this visit.  Physical Exam well-developed well-nourished gentleman in no acute distress.  Alert and oriented x3.  Ortho Exam left shoulder exam shows very little active range of motion secondary to pain.  He does not have any pain or weakness with triceps testing. He is neurovascular intact distally.  Specialty Comments:  No specialty comments available.  Imaging: No new imaging   PMFS History: Patient Active Problem List   Diagnosis Date Noted   Pressure injury of skin 11/06/2019   ESRD on dialysis [facility])    Heart failure with preserved ejection fraction ([place])    Goals of care, counseling/discussion    Palliative care encounter    Diarrhea [id number]   Metabolic acidosis, increased anion gap 10/13/2019   Acute urinary retention 10/13/2019   Weakness 10/05/2019   Acute on chronic diastolic CHF (congestive heart failure) ([place]) 09/27/2019   Vision loss of left eye 09/21/2019   Acute kidney injury superimposed on chronic kidney disease ([place]) 09/19/2019  Hyponatremia 09/19/2019   Homeless 09/19/2019   Anemia of chronic disease 09/19/2019   Vision loss, left eye 09/19/2019   Acute loss of vision, right 06/16/2019   Hypertensive urgency 04/17/2019   Hypokalemia 04/17/2019   Normocytic anemia 04/17/2019   Hyperglycemia    CKD (chronic kidney disease) stage 4, GFR 15-29 ml/min (HCC)    Alcohol use    Gastroparesis due to DM ([place])    Essential hypertension    Hyperlipidemia    Type 2 diabetes mellitus with hypoglycemia ([place]) 06/26/2012   Past Medical History:  Diagnosis Date   Abdominal pain 12/28/2012   Acute kidney injury  superimposed on chronic kidney disease ([place])    Anemia    Blindness 09/04/2019   Chest pain 02/14/2014   CHF (congestive heart failure) (HCC)    Diabetes mellitus    Diarrhea 10/14/2019   Dyspnea    Epigastric abdominal pain    Gastroparesis due to DM ([place])    Hypertension    Hypoalbuminemia 04/17/2019   Hypoxia    Nausea    Neuropathy of lower extremity    Oral candidiasis 06/26/2012   Pancreatitis 06/26/2012   Weight loss 06/26/2012   Wheezing     Family History  Problem Relation Age of Onset   Diabetes Mother    Lung disease Mother    Hypertension Mother    Diabetes Maternal Aunt    CAD Maternal Aunt    CAD Cousin     Past Surgical History:  Procedure Laterality Date   AV FISTULA PLACEMENT Right 10/29/2019   Procedure: RIGHT ARM BRACHIOBASILIC ARTERIOVENOUS (AV) FISTULA CREATION;  Surgeon: Rosetta Posner, MD;  Location: [facility];  Service: Vascular;  Laterality: Right;   [place] Right 01/05/2020   Procedure: RIGHT ARM SECOND STAGE [employer];  Surgeon: Rosetta Posner, MD;  Location: [facility];  Service: Vascular;  Laterality: Right;   EYE SURGERY     IR FLUORO GUIDE CV LINE RIGHT  10/28/2019   IR US GUIDE VASC ACCESS RIGHT  10/28/2019   Social History   Occupational History   Occupation: Social research officer, government, unemployed  Tobacco Use   Smoking status: Former    Packs/day: 0.00    Pack years: 0.00    Types: Cigarettes    Quit date: 03/22/2015    Years since quitting: 5.7   Smokeless tobacco: Never  Vaping Use   Vaping Use: Never used  Substance and Sexual Activity   Alcohol use: Not Currently   Drug use: No   Sexual activity: Not on file

## 2020-12-17 ENCOUNTER — Other Ambulatory Visit: Payer: Self-pay

## 2020-12-17 ENCOUNTER — Emergency Department (HOSPITAL_COMMUNITY)
Admission: EM | Admit: 2020-12-17 | Discharge: 2020-12-17 | Disposition: A | Discharge status: Home / Self Care | Payer: Medicare Other | Attending: Emergency Medicine | Admitting: Emergency Medicine

## 2020-12-17 ENCOUNTER — Encounter (HOSPITAL_COMMUNITY): Payer: Self-pay

## 2020-12-17 DIAGNOSIS — I132 Hypertensive heart and chronic kidney disease with heart failure and with stage 5 chronic kidney disease, or end stage renal disease: Secondary | ICD-10-CM | POA: Diagnosis not present

## 2020-12-17 DIAGNOSIS — Y846 Urinary catheterization as the cause of abnormal reaction of the patient, or of later complication, without mention of misadventure at the time of the procedure: Secondary | ICD-10-CM | POA: Diagnosis not present

## 2020-12-17 DIAGNOSIS — Z87891 Personal history of nicotine dependence: Secondary | ICD-10-CM | POA: Insufficient documentation

## 2020-12-17 DIAGNOSIS — T82838A Hemorrhage of vascular prosthetic devices, implants and grafts, initial encounter: Secondary | ICD-10-CM

## 2020-12-17 DIAGNOSIS — Z992 Dependence on renal dialysis: Secondary | ICD-10-CM | POA: Insufficient documentation

## 2020-12-17 DIAGNOSIS — E1143 Type 2 diabetes mellitus with diabetic autonomic (poly)neuropathy: Secondary | ICD-10-CM | POA: Diagnosis not present

## 2020-12-17 DIAGNOSIS — M25512 Pain in left shoulder: Secondary | ICD-10-CM | POA: Insufficient documentation

## 2020-12-17 DIAGNOSIS — E1129 Type 2 diabetes mellitus with other diabetic kidney complication: Secondary | ICD-10-CM | POA: Insufficient documentation

## 2020-12-17 DIAGNOSIS — Z7984 Long term (current) use of oral hypoglycemic drugs: Secondary | ICD-10-CM | POA: Insufficient documentation

## 2020-12-17 DIAGNOSIS — N186 End stage renal disease: Secondary | ICD-10-CM | POA: Insufficient documentation

## 2020-12-17 DIAGNOSIS — E785 Hyperlipidemia, unspecified: Secondary | ICD-10-CM | POA: Insufficient documentation

## 2020-12-17 DIAGNOSIS — M79652 Pain in left thigh: Secondary | ICD-10-CM | POA: Diagnosis not present

## 2020-12-17 DIAGNOSIS — E1169 Type 2 diabetes mellitus with other specified complication: Secondary | ICD-10-CM | POA: Insufficient documentation

## 2020-12-17 DIAGNOSIS — E1122 Type 2 diabetes mellitus with diabetic chronic kidney disease: Secondary | ICD-10-CM | POA: Insufficient documentation

## 2020-12-17 DIAGNOSIS — Z79899 Other long term (current) drug therapy: Secondary | ICD-10-CM | POA: Diagnosis not present

## 2020-12-17 DIAGNOSIS — I5033 Acute on chronic diastolic (congestive) heart failure: Secondary | ICD-10-CM | POA: Insufficient documentation

## 2020-12-17 DIAGNOSIS — Z7982 Long term (current) use of aspirin: Secondary | ICD-10-CM | POA: Insufficient documentation

## 2020-12-17 MED ORDER — OXYCODONE-ACETAMINOPHEN 5-325 MG PO TABS
1.0000 | ORAL_TABLET | Freq: Once | ORAL | Status: AC
  Administered 2020-12-17: 1 via ORAL
  Filled 2020-12-17: qty 1

## 2020-12-17 NOTE — ED Triage Notes (Addendum)
EMS reports from [facility], bleeding arteriovenous fistula line since yesterday.   BP 150/82 HR 90 RR 22 Sp02 96 RA CBG 149

## 2020-12-17 NOTE — Discharge Instructions (Addendum)
We have placed a suture to stop the bleeding at your fistula site.  This can be removed at your dialysis appointment. Return for recurrent bleeding

## 2020-12-17 NOTE — ED Provider Notes (Signed)
[facility] DEPT Provider Note   CSN: [id number] Arrival date & time: 12/17/20  [id number]     History Chief Complaint  Patient presents with   Vascular Access Problem    Christopher Davila is a 50 y.o. male.  [name] is a 50 y.o. male hypertension, diabetes, CHF, ESRD on hemodialysis, who presents to the emergency department from [facility] via EMS for bleeding AV fistula.  Patient last had dialysis on Friday and completed treatment with no complications.  He had a small amount of bleeding from his fistula after treatment and so they put a dressing on but then sent him home.  He did not notice any additional bleeding.  Came in from sitting outside at the facility yesterday evening and the staff noted that he was bleeding from his right upper arm where his fistula is located.  They left to the initial dressing in place but added a larger dressing on top of it but continued to no additional bleeding overnight and this morning so sent him to the emergency department for further evaluation.  Patient has complaints of chronic pain in his left thigh and shoulder, but otherwise no other complaints.  No lightheadedness or syncope.  No other aggravating or alleviating factors.  The history is provided by the patient.      Past Medical History:  Diagnosis Date   Abdominal pain 12/28/2012   Acute kidney injury superimposed on chronic kidney disease ([place])    Anemia    Blindness 09/04/2019   Chest pain 02/14/2014   CHF (congestive heart failure) ([place])    Diabetes mellitus    Diarrhea 10/14/2019   Dyspnea    Epigastric abdominal pain    Gastroparesis due to DM ([place])    Hypertension    Hypoalbuminemia 04/17/2019   Hypoxia    Nausea    Neuropathy of lower extremity    Oral candidiasis 06/26/2012   Pancreatitis 06/26/2012   Weight loss 06/26/2012   Wheezing     Patient Active Problem List   Diagnosis Date Noted   Pressure injury of skin  11/06/2019   ESRD on dialysis [facility])    Heart failure with preserved ejection fraction ([place])    Goals of care, counseling/discussion    Palliative care encounter    Diarrhea [id number]   Metabolic acidosis, increased anion gap 10/13/2019   Acute urinary retention 10/13/2019   Weakness 10/05/2019   Acute on chronic diastolic CHF (congestive heart failure) ([place]) 09/27/2019   Vision loss of left eye 09/21/2019   Acute kidney injury superimposed on chronic kidney disease ([place]) 09/19/2019   Hyponatremia 09/19/2019   Homeless 09/19/2019   Anemia of chronic disease 09/19/2019   Vision loss, left eye 09/19/2019   Acute loss of vision, right 06/16/2019   Hypertensive urgency 04/17/2019   Hypokalemia 04/17/2019   Normocytic anemia 04/17/2019   Hyperglycemia    CKD (chronic kidney disease) stage 4, GFR 15-29 ml/min (HCC)    Alcohol use    Gastroparesis due to DM ([place])    Essential hypertension    Hyperlipidemia    Type 2 diabetes mellitus with hypoglycemia ([place]) 06/26/2012    Past Surgical History:  Procedure Laterality Date   AV FISTULA PLACEMENT Right 10/29/2019   Procedure: RIGHT ARM BRACHIOBASILIC ARTERIOVENOUS (AV) FISTULA CREATION;  Surgeon: Rosetta Posner, MD;  Location: [facility];  Service: Vascular;  Laterality: Right;   [place] Right 01/05/2020   Procedure: RIGHT ARM SECOND STAGE [place]  TRANSPOSITION;  Surgeon: Rosetta Posner, MD;  Location: [facility] OR;  Service: Vascular;  Laterality: Right;   EYE SURGERY     IR FLUORO GUIDE CV LINE RIGHT  10/28/2019   IR US GUIDE VASC ACCESS RIGHT  10/28/2019       Family History  Problem Relation Age of Onset   Diabetes Mother    Lung disease Mother    Hypertension Mother    Diabetes Maternal Aunt    CAD Maternal Aunt    CAD Cousin     Social History   Tobacco Use   Smoking status: Former    Packs/day: 0.00    Pack years: 0.00    Types: Cigarettes    Quit date: 03/22/2015    Years since quitting: 5.7    Smokeless tobacco: Never  Vaping Use   Vaping Use: Never used  Substance Use Topics   Alcohol use: Not Currently   Drug use: No    Home Medications Prior to Admission medications   Medication Sig Start Date End Date Taking? Authorizing Provider  acetaminophen (TYLENOL) 500 MG tablet Take 1,000 mg by mouth every 6 (six) hours as needed for mild pain or moderate pain.    [provider]  Amino Acids-Protein Hydrolys (FEEDING SUPPLEMENT, PRO-STAT SUGAR FREE 64,) LIQD Take 30 mLs by mouth 2 (two) times daily.    [provider]  amLODipine (NORVASC) 10 MG tablet Take 1 tablet (10 mg total) by mouth daily. 11/19/19   Charlynne Cousins, MD  aspirin EC 81 MG tablet Take 1 tablet (81 mg total) by mouth daily. 05/24/15   Ghimire, Henreitta Leber, MD  atorvastatin (LIPITOR) 40 MG tablet Take 1 tablet (40 mg total) by mouth daily. 12/05/20   [place]ique, Peter M, MD  atorvastatin (LIPITOR) 40 MG tablet Take 1 tablet (40 mg total) by mouth daily. 12/05/20 03/05/21  [place]ique, Peter M, MD  brimonidine (ALPHAGAN) 0.2 % ophthalmic solution Place 1 drop into the left eye 2 (two) times daily.    [provider]  calcium acetate (PHOSLO) 667 MG capsule Take 1 capsule (667 mg total) by mouth 3 (three) times daily with meals. 11/19/19   Charlynne Cousins, MD  Darbepoetin Alfa (ARANESP) 60 MCG/0.3ML SOSY injection Inject 0.3 mLs (60 mcg total) into the vein every Tuesday with hemodialysis. 11/23/19   Charlynne Cousins, MD  dorzolamide-timolol (COSOPT) 22.3-6.8 MG/ML ophthalmic solution Place 1 drop into the left eye 2 (two) times daily. 09/23/19   Geradine Girt, DO  ferrous sulfate 325 (65 FE) MG tablet Take by mouth. 08/25/20   [provider]  gabapentin (NEURONTIN) 400 MG capsule Take 400 mg by mouth 3 (three) times daily.    [provider]  glipiZIDE (GLUCOTROL) 10 MG tablet Take 1 tablet (10 mg total) by mouth 2 (two) times daily. 09/23/19   Geradine Girt, DO  hydrALAZINE  (APRESOLINE) 25 MG tablet Take 0.5 tablets (12.5 mg total) by mouth 3 (three) times daily. 09/23/19   Geradine Girt, DO  latanoprost (XALATAN) 0.005 % ophthalmic solution Place 1 drop into the left eye at bedtime. 09/23/19   Geradine Girt, DO  lidocaine-prilocaine (EMLA) cream Apply topically. 04/27/20   [provider]  losartan (COZAAR) 100 MG tablet Take 1 tablet (100 mg total) by mouth daily. 11/19/19   Charlynne Cousins, MD  methocarbamol (ROBAXIN) 750 MG tablet Take 750 mg by mouth daily as needed for muscle spasms.    [provider]  Methoxy PEG-Epoetin Beta (MIRCERA IJ) Mircera 07/20/20 07/19/21  [provider]  moxifloxacin (VIGAMOX) 0.5 % ophthalmic solution Place 1 drop into the left eye 2 (two) times daily.  09/24/19   [provider]  oxyCODONE-acetaminophen (PERCOCET) 5-325 MG tablet Take 1 tablet by mouth every 4 (four) hours as needed for severe pain. 01/05/20 01/04/21  Baglia, Corrina, PA-C  potassium chloride SA (KLOR-CON) 20 MEQ tablet Take 20 mEq by mouth daily. 10/08/20   [provider]  pregabalin (LYRICA) 25 MG capsule Take by mouth. 08/25/20   [provider]  sodium bicarbonate 650 MG tablet Take 1 tablet (650 mg total) by mouth 3 (three) times daily. 09/23/19   Geradine Girt, DO  torsemide (DEMADEX) 100 MG tablet Take 1 tablet (100 mg total) by mouth daily. 11/19/19   Charlynne Cousins, MD    Allergies    Morphine and related  Review of Systems   Review of Systems  Constitutional:  Negative for chills and fever.  Respiratory:  Negative for shortness of breath.   Cardiovascular:  Negative for chest pain.  Musculoskeletal:  Positive for arthralgias (Chronic).  Skin:        Bleeding from AV fistula  Neurological:  Negative for syncope and light-headedness.  All other systems reviewed and are negative.  Physical Exam Updated Vital Signs BP (!) 161/90 (BP Location: Left Arm)   Pulse 93   Temp 98 F (36.7 C) (Oral)    Resp 16   SpO2 97%   Physical Exam Vitals and nursing note reviewed.  Constitutional:      General: He is not in acute distress.    Appearance: Normal appearance. He is well-developed. He is not ill-appearing or diaphoretic.     Comments: Chronically ill-appearing, but in no acute distress.  HENT:     Head: Normocephalic and atraumatic.  Eyes:     General:        Right eye: No discharge.        Left eye: No discharge.  Cardiovascular:     Rate and Rhythm: Normal rate and regular rhythm.     Heart sounds: Normal heart sounds.  Pulmonary:     Effort: Pulmonary effort is normal. No respiratory distress.     Breath sounds: Normal breath sounds.  Musculoskeletal:     Comments: Dialysis fistula present in the right upper extremity with bulky dressing in place with dried blood noted, no active blood noted beyond the dressing at this point.  Palpable thrill over fistula, distal pulses intact  Neurological:     Mental Status: He is alert and oriented to person, place, and time.     Coordination: Coordination normal.  Psychiatric:        Mood and Affect: Mood normal.        Behavior: Behavior normal.    ED Results / Procedures / Treatments   Labs (all labs ordered are listed, but only abnormal results are displayed) Labs Reviewed - No data to display  EKG None  Radiology No results found.  Procedures .Marland KitchenLaceration Repair  Date/Time: 12/17/2020 12:53 PM Performed by: Jacqlyn Larsen, PA-C Authorized by: Jacqlyn Larsen, PA-C   Consent:    Consent obtained:  Verbal   Consent given by:  Patient   Risks discussed:  Infection, pain, vascular damage and need for additional repair   Alternatives discussed:  No treatment Universal protocol:    Procedure explained and questions answered to patient or proxy's satisfaction: yes  Patient identity confirmed:  Verbally with patient Anesthesia:    Anesthesia method:  None Laceration details:    Location:  Shoulder/arm    Shoulder/arm location:  R upper arm (Very small area of bleeding from opening in the skin of her dialysis fistula)   Length (cm):  0.3   Depth (mm):  0 Pre-procedure details:    Preparation:  Patient was prepped and draped in usual sterile fashion Exploration:    Wound extent: areolar tissue violated   Treatment:    Area cleansed with:  Chlorhexidine Skin repair:    Repair method:  Sutures   Suture size:  5-0   Suture material:  Prolene   Suture technique:  Figure eight   Number of sutures:  1 Approximation:    Approximation:  Close Repair type:    Repair type:  Simple Post-procedure details:    Dressing:  Adhesive bandage   Procedure completion:  Tolerated well, no immediate complications Comments:     Patient with area of persistent bleeding over dialysis fistula despite pressure dressing that has been in place since yesterday.  No pulsatile bleeding.  Single figure-of-eight stitch placed over the bleeding with good hemostasis   Medications Ordered in ED Medications  oxyCODONE-acetaminophen (PERCOCET/ROXICET) 5-325 MG per tablet 1 tablet (1 tablet Oral Given 12/17/20 1206)    ED Course  I have reviewed the triage vital signs and the nursing notes.  Pertinent labs & imaging results that were available during my care of the patient were reviewed by me and considered in my medical decision making (see chart for details).    MDM Rules/Calculators/A&P                         50 year old male presents with persistent bleeding from dialysis fistula, last dialysis on Friday afternoon, came home with dressing in place which seem to be doing fine but yesterday afternoon/evening was noticed to have blood dripping from the dressing, they placed larger dressing over top of this but continued to note bleeding this morning, so patient sent in for evaluation.  He was able to complete his dialysis treatment he denies any other symptoms, no pain in the arm or over the fistula.  No lightheadedness  or syncope.  There is a larger dressing in place and a Tegaderm with gauze and placed directly over the fistula.  Dressing removed and there is a moderate amount of clotted blood over the fistula, this was cleared away and there is 1 single area that is continuing to have persistent oozing from one of the access sites from dialysis.  This has not resolved with pressure dressing, single figure-of-eight suture placed with good hemostasis.  Patient continues to have normal thrill throughout the fistula.  Patient monitored for an hour after suture placed with no additional bleeding.  Discharged back to facility in good condition.  Will have suture removed at patient's next dialysis session.  Final Clinical Impression(s) / ED Diagnoses Final diagnoses:  Bleeding from dialysis shunt, initial encounter [facility])    Rx / DC Orders ED Discharge Orders     None        Jacqlyn Larsen, [place] 12/17/20 1301    Wyvonnia Dusky, MD 12/17/20 [phone]

## 2020-12-26 ENCOUNTER — Other Ambulatory Visit: Payer: Self-pay

## 2020-12-26 ENCOUNTER — Non-Acute Institutional Stay: Payer: Medicare Other | Admitting: Hospice

## 2020-12-26 DIAGNOSIS — I5032 Chronic diastolic (congestive) heart failure: Secondary | ICD-10-CM

## 2020-12-26 DIAGNOSIS — Z515 Encounter for palliative care: Secondary | ICD-10-CM

## 2020-12-26 DIAGNOSIS — Z992 Dependence on renal dialysis: Secondary | ICD-10-CM

## 2020-12-26 DIAGNOSIS — N186 End stage renal disease: Secondary | ICD-10-CM

## 2020-12-26 NOTE — Progress Notes (Signed)
[facility] Consult Note Telephone: [phone]  Fax: [phone]  PATIENT NAME: Christopher Davila [id number] [phone] (home)  DOB: Nov 09, 1970 MRN: [id number]  PRIMARY CARE PROVIDER:    [name], MD,  [address] [place] [address] [phone]  REFERRING PROVIDER:   [name], MD [address] [place],  [place] [address] [phone]  RESPONSIBLE PARTY:   Self Emergency contact: cousin - [place]     Name Relation Home Work Mobile   [place] Relative   [phone]   Bonnie, Roig Relative [phone]          I met face to face with patient at the facility. Palliative Care was asked to follow this patient by consultation request of  Christopher Band, MD to address advance care planning, complex medical decision making and goals of care clarification. This is the initial visit.    ASSESSMENT AND / RECOMMENDATIONS:   Advance Care Planning: Our advance care planning conversation included a discussion about:    The value and importance of advance care planning  Difference between Hospice and Palliative care Exploration of goals of care in the event of a sudden injury or illness  Identification and preparation of a healthcare agent  Review and updating or creation of an  advance directive document .   CODE STATUS: Discussion on ramifications and implications of code status. Patient affirmed he is a Full code.   Goals of Care: Goals include to maximize quality of life and symptom management   Visit consisted of counseling and education dealing with the complex and emotionally intense issues of symptom management and palliative care in the setting of serious and potentially life-threatening illness.  Patient shared that her 2 children and his faith in God keep him anchored and going.  Therapeutic presence/listening provided; ample emotional support provided  palliative care team will continue to support patient, patient's family, and medical team.    I spent  46 minutes providing this initial consultation. More than 50% of the time in this consultation was spent on counseling patient and coordinating communication. --------------------------------------------------------------------------------------------------------------------------------------  Symptom Management/Plan: ESRD: On hemodialysis. Tolerating well. Continue dialysis as scheduled Mon  Wed and Fri.  CHF: Managed with Demadex.  Routine CBC BMP Type 2 DM: Current A1c is 4.4. Continue to manage with diet. CBGs at faility within normal range.  Recheck A1c in 3 to 6 months. Chronic respiratory failure with hypoxia: Continue oxygen at 2 L/min.  Continue diuretic as ordered. Follow up: Palliative care will continue to follow for complex medical decision making, advance care planning, and clarification of goals. Return 6 weeks or prn.Encouraged to call provider sooner with any concerns.   Family /Caregiver/Community Supports: Patient in SNF for ongoing care.  HOSPICE ELIGIBILITY/DIAGNOSIS: TBD  Chief Complaint: Initial Palliative care visit  HISTORY OF PRESENT ILLNESS:  Christopher Davila is a 50 y.o. year old male  with multiple medical conditions including  end stage renal stage on dialysis; condition has impaired his independence and quality of life. He reports that his fluid build up and malaise get worse  when he is not regular with his dialysis schedule; adherence to dialysis schedule and support and encouragement from family and friends are helpful. He denies pain/discomfort. History of CHF with preserved ejection fraction, chronic respiratory failure with hypoxia, Type 2 DM, HTN, Left shoulder osteoarthritis, Depression.   History obtained from review of EMR, discussion with primary team, caregiver, family and/or [name].  Review and summarization of Epic records shows history from other  than patient. Rest of 10 point ROS asked and negative.     Review of lab tests/diagnostics     HEMOGLOBIN A1c 12/22/2020 at facility Panel/Test: Hemoglobin A1c/Hemoglobin.total LOINC Code: 0177-9 Lab Test Description: HEMOGLOBIN A1c  Specimen #:  Specimen Source:  Specimen Site Modifier:  Collection Volume:  No. of Sample Containers:   Observation Method:  Petra Kuba of Abnormality:  4.4 % of total Hgb <5.7  Final 6/22/222 from facility  139 mmol/L 135-146  Final        Potassium 3.6 mmol/L 3.5-5.3  Final        Chloride 96 mmol/L 95-109  Final        CO2 28.8 mmol/L 21.0-31.0  Final        Anion Gap 14 meq/L 5-21  Final        Glucose 103 mg/dL 70-105  Final        Urea Nitrogen (BUN) 38 mg/dL 5-25 H Final        Creatinine, Serum 5.18 mg/dL 0.60-1.30 H Final        BUN/Creatinine Ratio 7.3 Ratio 6.0-25.0  Final        Calcium 9.2 mg/dL 8.2-10.5  Final        Glomerular Filtration Rate 12.66 mls/min/1.73 m2 >60.00 L Final        Glomerular Filtration Rate - African American 15.32 mls/min/1.73 m2 >60.00 L Final    Reference from National Kidney Disease Education Program.    For the purpose of screening for the presence of diabetes:    ROS General: NAD ENMT: denies dysphagia Cardiovascular: denies chest pain/discomfort Pulmonary: denies cough, denies SOB Abdomen: endorses good appetite, denies constipation/diarrhea GU: denies dysuria, urinary frequency MSK:  endorses weakness,  no falls reported Skin: denies rashes or wounds Neurological: denies pain, denies insomnia Psych: Endorses positive mood Heme/lymph/immuno: denies bruises, abnormal bleeding  Physical Exam: Height/Weight 5 feet 3 inches/126.5 Ibs Constitutional: NAD General: Well groomed, cooperative ENMT: Moist mucous membrane CV: S1 S2, RRR, no LE edema Pulmonary: No adventitious lung sounds, no increased work of breathing, no cough, oxygen supplementation 2L/Min Abdomen:  active BS + 4 quadrants, soft and non tender GU: no suprapubic tenderness MSK: weakness, sarcopenia, ambulatory with rolling walker Skin: warm and dry, no rashes or wounds on visible skin Neuro:  weakness, otherwise non focal Psych: non-anxious affect Hem/lymph/immuno: no widespread bruising   PAST MEDICAL HISTORY:  Active Ambulatory Problems    Diagnosis Date Noted   Type 2 diabetes mellitus with hypoglycemia ([place]) 06/26/2012   Essential hypertension    Hyperlipidemia    Gastroparesis due to DM (HCC)    Hyperglycemia    CKD (chronic kidney disease) stage 4, GFR 15-29 ml/min (HCC)    Alcohol use    Hypertensive urgency 04/17/2019   Hypokalemia 04/17/2019   Normocytic anemia 04/17/2019   Acute loss of vision, right 06/16/2019   Acute kidney injury superimposed on chronic kidney disease ([place]) 09/19/2019   Hyponatremia 09/19/2019   Homeless 09/19/2019   Anemia of chronic disease 09/19/2019   Vision loss, left eye 09/19/2019   Vision loss of left eye 09/21/2019   Acute on chronic diastolic CHF (congestive heart failure) ([place]) 09/27/2019   Weakness 10/05/2019   Diarrhea 39/08/90   Metabolic acidosis, increased anion gap 10/13/2019   Acute urinary retention 10/13/2019   ESRD on dialysis (HCC)    Heart failure with preserved ejection fraction (HCC)    Goals of care,  counseling/discussion    Palliative care encounter    Pressure injury of skin 11/06/2019   Resolved Ambulatory Problems    Diagnosis Date Noted   Hypertension 06/26/2012   Pancreatitis 06/26/2012   Weight loss 06/26/2012   Oral candidiasis 06/26/2012   Abdominal pain 12/28/2012   Chest pain 02/14/2014   Nausea    Dyspnea    Heart failure ([place]) 02/13/2019   Epigastric abdominal pain    Hypoxia    Wheezing    Acute kidney injury superimposed on chronic kidney disease (HCC)    Acute renal failure superimposed on stage 3 chronic kidney disease ([place]) 04/17/2019   Hypoalbuminemia 04/17/2019   CHF (congestive  heart failure) ([place]) 09/26/2019   Past Medical History:  Diagnosis Date   Anemia    Blindness 09/04/2019   Diabetes mellitus    Neuropathy of lower extremity     SOCIAL HX:  Social History   Tobacco Use   Smoking status: Former    Packs/day: 0.00    Pack years: 0.00    Types: Cigarettes    Quit date: 03/22/2015    Years since quitting: 5.7   Smokeless tobacco: Never  Substance Use Topics   Alcohol use: Not Currently     FAMILY HX:  Family History  Problem Relation Age of Onset   Diabetes Mother    Lung disease Mother    Hypertension Mother    Diabetes Maternal Aunt    CAD Maternal Aunt    CAD Cousin       ALLERGIES:  Allergies  Allergen Reactions   Morphine And Related Itching and Other (See Comments)    Pt prefers not to be given this drug      PERTINENT MEDICATIONS:  Outpatient Encounter Medications as of 12/26/2020  Medication Sig   acetaminophen (TYLENOL) 500 MG tablet Take 1,000 mg by mouth every 6 (six) hours as needed for mild pain or moderate pain.   Amino Acids-Protein Hydrolys (FEEDING SUPPLEMENT, PRO-STAT SUGAR FREE 64,) LIQD Take 30 mLs by mouth 2 (two) times daily.   amLODipine (NORVASC) 10 MG tablet Take 1 tablet (10 mg total) by mouth daily.   aspirin EC 81 MG tablet Take 1 tablet (81 mg total) by mouth daily.   atorvastatin (LIPITOR) 40 MG tablet Take 1 tablet (40 mg total) by mouth daily.   atorvastatin (LIPITOR) 40 MG tablet Take 1 tablet (40 mg total) by mouth daily.   brimonidine (ALPHAGAN) 0.2 % ophthalmic solution Place 1 drop into the left eye 2 (two) times daily.   calcium acetate (PHOSLO) 667 MG capsule Take 1 capsule (667 mg total) by mouth 3 (three) times daily with meals.   Darbepoetin Alfa (ARANESP) 60 MCG/0.3ML SOSY injection Inject 0.3 mLs (60 mcg total) into the vein every Tuesday with hemodialysis.   dorzolamide-timolol (COSOPT) 22.3-6.8 MG/ML ophthalmic solution Place 1 drop into the left eye 2 (two) times daily.   ferrous  sulfate 325 (65 FE) MG tablet Take by mouth.   gabapentin (NEURONTIN) 400 MG capsule Take 400 mg by mouth 3 (three) times daily.   glipiZIDE (GLUCOTROL) 10 MG tablet Take 1 tablet (10 mg total) by mouth 2 (two) times daily.   hydrALAZINE (APRESOLINE) 25 MG tablet Take 0.5 tablets (12.5 mg total) by mouth 3 (three) times daily.   latanoprost (XALATAN) 0.005 % ophthalmic solution Place 1 drop into the left eye at bedtime.   lidocaine-prilocaine (EMLA) cream Apply topically.   losartan (COZAAR) 100 MG tablet Take 1 tablet (100  mg total) by mouth daily.   methocarbamol (ROBAXIN) 750 MG tablet Take 750 mg by mouth daily as needed for muscle spasms.   Methoxy PEG-Epoetin Beta (MIRCERA IJ) Mircera   moxifloxacin (VIGAMOX) 0.5 % ophthalmic solution Place 1 drop into the left eye 2 (two) times daily.    oxyCODONE-acetaminophen (PERCOCET) 5-325 MG tablet Take 1 tablet by mouth every 4 (four) hours as needed for severe pain.   potassium chloride SA (KLOR-CON) 20 MEQ tablet Take 20 mEq by mouth daily.   pregabalin (LYRICA) 25 MG capsule Take by mouth.   sodium bicarbonate 650 MG tablet Take 1 tablet (650 mg total) by mouth 3 (three) times daily.   torsemide (DEMADEX) 100 MG tablet Take 1 tablet (100 mg total) by mouth daily.   No facility-administered encounter medications on file as of 12/26/2020.     Thank you for the opportunity to participate in the care of Mr. Cumbie.  The palliative care team will continue to follow. Please call our office at [phone] if we can be of additional assistance.   Note: Portions of this note were generated with [employer]. Dictation errors may occur despite best attempts at proofreading.  Teodoro Spray, NP

## 2021-03-16 ENCOUNTER — Other Ambulatory Visit: Payer: Self-pay

## 2021-03-16 ENCOUNTER — Encounter (HOSPITAL_COMMUNITY): Payer: Self-pay

## 2021-03-16 ENCOUNTER — Emergency Department (HOSPITAL_COMMUNITY): Payer: Medicare Other

## 2021-03-16 ENCOUNTER — Emergency Department (HOSPITAL_COMMUNITY)
Admission: EM | Admit: 2021-03-16 | Discharge: 2021-03-17 | Disposition: A | Discharge status: Home / Self Care | Payer: Medicare Other | Attending: Emergency Medicine | Admitting: Emergency Medicine

## 2021-03-16 DIAGNOSIS — E1143 Type 2 diabetes mellitus with diabetic autonomic (poly)neuropathy: Secondary | ICD-10-CM | POA: Diagnosis not present

## 2021-03-16 DIAGNOSIS — I5033 Acute on chronic diastolic (congestive) heart failure: Secondary | ICD-10-CM | POA: Insufficient documentation

## 2021-03-16 DIAGNOSIS — D631 Anemia in chronic kidney disease: Secondary | ICD-10-CM | POA: Diagnosis not present

## 2021-03-16 DIAGNOSIS — R1084 Generalized abdominal pain: Secondary | ICD-10-CM | POA: Diagnosis present

## 2021-03-16 DIAGNOSIS — Z7982 Long term (current) use of aspirin: Secondary | ICD-10-CM | POA: Diagnosis not present

## 2021-03-16 DIAGNOSIS — R0602 Shortness of breath: Secondary | ICD-10-CM | POA: Diagnosis not present

## 2021-03-16 DIAGNOSIS — Z992 Dependence on renal dialysis: Secondary | ICD-10-CM | POA: Diagnosis not present

## 2021-03-16 DIAGNOSIS — Z87891 Personal history of nicotine dependence: Secondary | ICD-10-CM | POA: Diagnosis not present

## 2021-03-16 DIAGNOSIS — N186 End stage renal disease: Secondary | ICD-10-CM | POA: Diagnosis not present

## 2021-03-16 DIAGNOSIS — K5900 Constipation, unspecified: Secondary | ICD-10-CM | POA: Diagnosis not present

## 2021-03-16 DIAGNOSIS — I132 Hypertensive heart and chronic kidney disease with heart failure and with stage 5 chronic kidney disease, or end stage renal disease: Secondary | ICD-10-CM | POA: Diagnosis not present

## 2021-03-16 DIAGNOSIS — Z79899 Other long term (current) drug therapy: Secondary | ICD-10-CM | POA: Diagnosis not present

## 2021-03-16 LAB — COMPREHENSIVE METABOLIC PANEL
ALT: 10 U/L (ref 0–44)
AST: 16 U/L (ref 15–41)
Albumin: 3.5 g/dL (ref 3.5–5.0)
Alkaline Phosphatase: 79 U/L (ref 38–126)
Anion gap: 13 (ref 5–15)
BUN: 27 mg/dL — ABNORMAL HIGH (ref 6–20)
CO2: 26 mmol/L (ref 22–32)
Calcium: 9.4 mg/dL (ref 8.9–10.3)
Chloride: 100 mmol/L (ref 98–111)
Creatinine, Ser: 5.55 mg/dL — ABNORMAL HIGH (ref 0.61–1.24)
GFR, Estimated: 12 mL/min — ABNORMAL LOW (ref >60)
Glucose, Bld: 78 mg/dL (ref 70–99)
Potassium: 4 mmol/L (ref 3.5–5.1)
Sodium: 139 mmol/L (ref 135–145)
Total Bilirubin: 1 mg/dL (ref 0.3–1.2)
Total Protein: 7.7 g/dL (ref 6.5–8.1)

## 2021-03-16 LAB — CBC WITH DIFFERENTIAL/PLATELET
Abs Immature Granulocytes: 0.04 10*3/uL (ref 0.00–0.07)
Basophils Absolute: 0.1 10*3/uL (ref 0.0–0.1)
Basophils Relative: 1 %
Eosinophils Absolute: 0.3 10*3/uL (ref 0.0–0.5)
Eosinophils Relative: 2 %
HCT: 37.1 % — ABNORMAL LOW (ref 39.0–52.0)
Hemoglobin: 11.5 g/dL — ABNORMAL LOW (ref 13.0–17.0)
Immature Granulocytes: 0 %
Lymphocytes Relative: 13 %
Lymphs Abs: 1.3 10*3/uL (ref 0.7–4.0)
MCH: 28.7 pg (ref 26.0–34.0)
MCHC: 31 g/dL (ref 30.0–36.0)
MCV: 92.5 fL (ref 80.0–100.0)
Monocytes Absolute: 1 10*3/uL (ref 0.1–1.0)
Monocytes Relative: 10 %
Neutro Abs: 7.6 10*3/uL (ref 1.7–7.7)
Neutrophils Relative %: 74 %
Platelets: 188 10*3/uL (ref 150–400)
RBC: 4.01 MIL/uL — ABNORMAL LOW (ref 4.22–5.81)
RDW: 16.1 % — ABNORMAL HIGH (ref 11.5–15.5)
WBC: 10.2 10*3/uL (ref 4.0–10.5)
nRBC: 0 % (ref 0.0–0.2)

## 2021-03-16 LAB — TROPONIN I (HIGH SENSITIVITY)
Troponin I (High Sensitivity): 19 ng/L — ABNORMAL HIGH (ref <18)
Troponin I (High Sensitivity): 17 ng/L (ref <18)

## 2021-03-16 LAB — BRAIN NATRIURETIC PEPTIDE: B Natriuretic Peptide: 188.6 pg/mL — ABNORMAL HIGH (ref 0.0–100.0)

## 2021-03-16 LAB — LIPASE, BLOOD: Lipase: 21 U/L (ref 11–51)

## 2021-03-16 MED ORDER — ONDANSETRON HCL 4 MG/2ML IJ SOLN
4.0000 mg | Freq: Once | INTRAMUSCULAR | Status: AC
  Administered 2021-03-16: 4 mg via INTRAVENOUS
  Filled 2021-03-16: qty 2

## 2021-03-16 MED ORDER — HYDRALAZINE HCL 25 MG PO TABS
12.5000 mg | ORAL_TABLET | Freq: Once | ORAL | Status: AC
  Administered 2021-03-16: 12.5 mg via ORAL
  Filled 2021-03-16: qty 1

## 2021-03-16 NOTE — Discharge Instructions (Signed)
He has been rescheduled for dialysis tomorrow morning at [facility]. Patient needs to be there at 11:25 AM for an 11:40 AM start time.   The workup was overall reassuring in the ED today without any acute findings. The CT scan of the abdomen showed moderate constipation throughout. Patient should be on a bowel regimen as this is likely the cause of his abdominal pain.   He has maintained oxygen saturations above 95% on his 2L while in the ED. His chest xray showed small bilateral pleural effusions.   Please have patient follow up with PCP for further evaluation.

## 2021-03-16 NOTE — ED Provider Notes (Signed)
[facility] EMERGENCY DEPARTMENT Provider Note   CSN: [id number] Arrival date & time: 03/16/21  1129     History Chief Complaint  Patient presents with   Abdominal Pain    Christopher Davila is a 50 y.o. male with Pmhx HTN, Diabetes, Gastroparesis, CHF, ESRD on Dialysis MWF who presents to the ED today from [place] with complaints of abdominal pain.  Patient had told EMS that he ate something last night that made him feel poorly.  He tells me that he has had nausea and multiple episodes of emesis since last night.  He also complains of shortness of breath however it is difficult to assess if he feels more short of breath than normal.  He is chronically on 2 L nasal cannula.  Patient last dialyzed on Wednesday.  He was supposed to go today however felt poorly and called EMS instead.  Pt is blind.   The history is provided by the patient, medical records and the EMS personnel.      Past Medical History:  Diagnosis Date   Abdominal pain 12/28/2012   Acute kidney injury superimposed on chronic kidney disease ([place])    Anemia    Blindness 09/04/2019   Chest pain 02/14/2014   CHF (congestive heart failure) ([place])    Diabetes mellitus    Diarrhea 10/14/2019   Dyspnea    Epigastric abdominal pain    Gastroparesis due to DM ([place])    Hypertension    Hypoalbuminemia 04/17/2019   Hypoxia    Nausea    Neuropathy of lower extremity    Oral candidiasis 06/26/2012   Pancreatitis 06/26/2012   Weight loss 06/26/2012   Wheezing     Patient Active Problem List   Diagnosis Date Noted   Pressure injury of skin 11/06/2019   ESRD on dialysis [facility])    Heart failure with preserved ejection fraction ([place])    Goals of care, counseling/discussion    Palliative care encounter    Diarrhea [id number]   Metabolic acidosis, increased anion gap 10/13/2019   Acute urinary retention 10/13/2019   Weakness 10/05/2019   Acute on chronic diastolic CHF (congestive heart failure) ([place])  09/27/2019   Vision loss of left eye 09/21/2019   Acute kidney injury superimposed on chronic kidney disease ([place]) 09/19/2019   Hyponatremia 09/19/2019   Homeless 09/19/2019   Anemia of chronic disease 09/19/2019   Vision loss, left eye 09/19/2019   Acute loss of vision, right 06/16/2019   Hypertensive urgency 04/17/2019   Hypokalemia 04/17/2019   Normocytic anemia 04/17/2019   Hyperglycemia    CKD (chronic kidney disease) stage 4, GFR 15-29 ml/min (HCC)    Alcohol use    Gastroparesis due to DM ([place])    Essential hypertension    Hyperlipidemia    Type 2 diabetes mellitus with hypoglycemia ([place]) 06/26/2012    Past Surgical History:  Procedure Laterality Date   AV FISTULA PLACEMENT Right 10/29/2019   Procedure: RIGHT ARM BRACHIOBASILIC ARTERIOVENOUS (AV) FISTULA CREATION;  Surgeon: Rosetta Posner, MD;  Location: [facility] OR;  Service: Vascular;  Laterality: Right;   [place] Right 01/05/2020   Procedure: RIGHT ARM SECOND STAGE [place];  Surgeon: Rosetta Posner, MD;  Location: [facility] OR;  Service: Vascular;  Laterality: Right;   EYE SURGERY     IR FLUORO GUIDE CV LINE RIGHT  10/28/2019   IR US GUIDE VASC ACCESS RIGHT  10/28/2019       Family History  Problem Relation Age  of Onset   Diabetes Mother    Lung disease Mother    Hypertension Mother    Diabetes Maternal Aunt    CAD Maternal Aunt    CAD Cousin     Social History   Tobacco Use   Smoking status: Former    Packs/day: 0.00    Types: Cigarettes    Quit date: 03/22/2015    Years since quitting: 5.9   Smokeless tobacco: Never  Vaping Use   Vaping Use: Never used  Substance Use Topics   Alcohol use: Not Currently   Drug use: No    Home Medications Prior to Admission medications   Medication Sig Start Date End Date Taking? Authorizing Provider  acetaminophen (TYLENOL) 500 MG tablet Take 1,000 mg by mouth every 6 (six) hours as needed for mild pain or moderate pain.    [provider]  Amino Acids-Protein Hydrolys (FEEDING SUPPLEMENT, PRO-STAT SUGAR FREE 64,) LIQD Take 30 mLs by mouth 2 (two) times daily.    [provider]  amLODipine (NORVASC) 10 MG tablet Take 1 tablet (10 mg total) by mouth daily. 11/19/19   Charlynne Cousins, MD  aspirin EC 81 MG tablet Take 1 tablet (81 mg total) by mouth daily. 05/24/15   Ghimire, Henreitta Leber, MD  atorvastatin (LIPITOR) 40 MG tablet Take 1 tablet (40 mg total) by mouth daily. 12/05/20   [place], Peter M, MD  atorvastatin (LIPITOR) 40 MG tablet Take 1 tablet (40 mg total) by mouth daily. 12/05/20 03/05/21  [place], Peter M, MD  brimonidine (ALPHAGAN) 0.2 % ophthalmic solution Place 1 drop into the left eye 2 (two) times daily.    [provider]  calcium acetate (PHOSLO) 667 MG capsule Take 1 capsule (667 mg total) by mouth 3 (three) times daily with meals. 11/19/19   Charlynne Cousins, MD  Darbepoetin Alfa (ARANESP) 60 MCG/0.3ML SOSY injection Inject 0.3 mLs (60 mcg total) into the vein every Tuesday with hemodialysis. 11/23/19   Charlynne Cousins, MD  dorzolamide-timolol (COSOPT) 22.3-6.8 MG/ML ophthalmic solution Place 1 drop into the left eye 2 (two) times daily. 09/23/19   Geradine Girt, DO  ferrous sulfate 325 (65 FE) MG tablet Take by mouth. 08/25/20   [provider]  gabapentin (NEURONTIN) 400 MG capsule Take 400 mg by mouth 3 (three) times daily.    [provider]  glipiZIDE (GLUCOTROL) 10 MG tablet Take 1 tablet (10 mg total) by mouth 2 (two) times daily. 09/23/19   Geradine Girt, DO  hydrALAZINE (APRESOLINE) 25 MG tablet Take 0.5 tablets (12.5 mg total) by mouth 3 (three) times daily. 09/23/19   Geradine Girt, DO  latanoprost (XALATAN) 0.005 % ophthalmic solution Place 1 drop into the left eye at bedtime. 09/23/19   Geradine Girt, DO  lidocaine-prilocaine (EMLA) cream Apply topically. 04/27/20   [provider]  losartan (COZAAR) 100 MG tablet Take 1 tablet (100 mg total) by  mouth daily. 11/19/19   Charlynne Cousins, MD  methocarbamol (ROBAXIN) 750 MG tablet Take 750 mg by mouth daily as needed for muscle spasms.    [provider]  Methoxy PEG-Epoetin Beta (MIRCERA IJ) Mircera 07/20/20 07/19/21  [provider]  moxifloxacin (VIGAMOX) 0.5 % ophthalmic solution Place 1 drop into the left eye 2 (two) times daily.  09/24/19   [provider]  potassium chloride SA (KLOR-CON) 20 MEQ tablet Take 20 mEq by mouth daily. 10/08/20   [provider]  pregabalin (LYRICA)  25 MG capsule Take by mouth. 08/25/20   [provider]  sodium bicarbonate 650 MG tablet Take 1 tablet (650 mg total) by mouth 3 (three) times daily. 09/23/19   Geradine Girt, DO  torsemide (DEMADEX) 100 MG tablet Take 1 tablet (100 mg total) by mouth daily. 11/19/19   Charlynne Cousins, MD    Allergies    Morphine and related  Review of Systems   Review of Systems  Constitutional:  Negative for chills and fever.  Respiratory:  Positive for shortness of breath.   Cardiovascular:  Negative for chest pain.  Gastrointestinal:  Positive for abdominal pain, nausea and vomiting. Negative for diarrhea.  All other systems reviewed and are negative.  Physical Exam Updated Vital Signs BP (!) 155/102 (BP Location: Left Arm)   Pulse 97   Temp 98.4 F (36.9 C) (Oral)   Resp 18   Ht '5\' 3"'$  (1.6 m)   Wt 59 kg   SpO2 100%   BMI 23.03 kg/m   Physical Exam Vitals and nursing note reviewed.  Constitutional:      Appearance: He is not ill-appearing.     Comments: Drowsy but easily arousable  HENT:     Head: Normocephalic and atraumatic.  Eyes:     Conjunctiva/sclera: Conjunctivae normal.     Comments: Blind  Cardiovascular:     Rate and Rhythm: Normal rate and regular rhythm.     Heart sounds: Normal heart sounds.  Pulmonary:     Effort: Pulmonary effort is normal.     Breath sounds: Normal breath sounds. No wheezing, rhonchi or rales.     Comments: On 4 LC [place]  (baseline). Speaking in full sentences. No tachypnea. LCTAB.  Abdominal:     Palpations: Abdomen is soft.     Tenderness: There is generalized abdominal tenderness. There is no guarding or rebound.  Musculoskeletal:     Cervical back: Neck supple.  Skin:    General: Skin is warm and dry.  Neurological:     Mental Status: He is alert.    ED Results / Procedures / Treatments   Labs (all labs ordered are listed, but only abnormal results are displayed) Labs Reviewed  COMPREHENSIVE METABOLIC PANEL - Abnormal; Notable for the following components:      Result Value   BUN 27 (*)    Creatinine, Ser 5.55 (*)    GFR, Estimated 12 (*)    All other components within normal limits  CBC WITH DIFFERENTIAL/PLATELET - Abnormal; Notable for the following components:   RBC 4.01 (*)    Hemoglobin 11.5 (*)    HCT 37.1 (*)    RDW 16.1 (*)    All other components within normal limits  BRAIN NATRIURETIC PEPTIDE - Abnormal; Notable for the following components:   B Natriuretic Peptide 188.6 (*)    All other components within normal limits  TROPONIN I (HIGH SENSITIVITY) - Abnormal; Notable for the following components:   Troponin I (High Sensitivity) 19 (*)    All other components within normal limits  LIPASE, BLOOD  TROPONIN I (HIGH SENSITIVITY)    EKG None  Radiology CT Abdomen Pelvis Wo Contrast  Result Date: 03/16/2021 CLINICAL DATA:  Abdominal pain since last night EXAM: CT ABDOMEN AND PELVIS WITHOUT CONTRAST TECHNIQUE: Multidetector CT imaging of the abdomen and pelvis was performed following the standard protocol without IV contrast. COMPARISON:  02/27/2020 FINDINGS: Lower chest: Moderate bilateral pleural effusions associated atelectasis or consolidation, improved compared to prior examination. Mild cardiomegaly.  Hepatobiliary: No solid liver abnormality is seen. Small gallstones and/or sludge in the dependent gallbladder (series 3, image 39). No gallbladder wall thickening, or biliary  dilatation. Pancreas: Unremarkable. No pancreatic ductal dilatation or surrounding inflammatory changes. Spleen: Normal in size without significant abnormality. Adrenals/Urinary Tract: Adrenal glands are unremarkable. Kidneys are normal, without renal calculi, solid lesion, or hydronephrosis. Bladder is unremarkable. Stomach/Bowel: Stomach is within normal limits. Appendix is not clearly visualized. No evidence of bowel wall thickening, distention, or inflammatory changes. Large burden of stool and stool balls throughout the colon. Vascular/Lymphatic: Aortic atherosclerosis. No enlarged abdominal or pelvic lymph nodes. Reproductive: No mass or other significant abnormality. Other: No abdominal wall hernia or abnormality. No abdominopelvic ascites. Musculoskeletal: No acute or significant osseous findings. IMPRESSION: 1. No definite noncontrast CT findings of the abdomen or pelvis to explain abdominal pain. 2. Large burden of stool and stool balls throughout the colon. 3. Moderate bilateral pleural effusions and associated atelectasis or consolidation, improved compared to prior examination. 4. Mild cardiomegaly. Aortic Atherosclerosis (ICD10-I70.0). Electronically Signed   By: Delanna Ahmadi M.D.   On: 03/16/2021 14:14   DG Chest Port 1 View  Result Date: 03/16/2021 CLINICAL DATA:  Shortness of breath. EXAM: PORTABLE CHEST 1 VIEW COMPARISON:  Chest x-ray 08/11/2020 FINDINGS: The heart is borderline enlarged given the AP projection and portable technique. There is central vascular congestion, perihilar pulmonary edema, bilateral pleural effusions, left much larger than right and overlying atelectasis. IMPRESSION: CHF with bilateral pleural effusions and overlying atelectasis. Electronically Signed   By: Marijo Sanes M.D.   On: 03/16/2021 13:20    Procedures Procedures   Medications Ordered in ED Medications  ondansetron (ZOFRAN) injection 4 mg (4 mg Intravenous Given 03/16/21 1256)    ED Course  I have  reviewed the triage vital signs and the nursing notes.  Pertinent labs & imaging results that were available during my care of the patient were reviewed by me and considered in my medical decision making (see chart for details).  Clinical Course as of 03/16/21 1641  Fri Mar 16, 2021  1341 Received call from physician at [facility] Dr. Ashby Dawes; she or her NP will follow up in 2-3 hours for update as labs and CT still pending [MV]    Clinical Course User Index [MV] Eustaquio Maize, PA-C   MDM Rules/Calculators/A&P                           50 year old male who presents to the ED today from [place] with complaints of abdominal pain began last night with associated nausea, emesis, shortness of breath.  Chronically on 2 L, currently on 4 L on arrival.  Vitals are stable.  Blood pressure slightly elevated 155/102.  He had an EKG on arrival without acute ischemic changes.  On my exam he has diffuse abdominal tenderness palpation without rebound or guarding.  He is very drowsy however easily arousable.  States that he typically gets pain medication before going to dialysis and received it earlier today however decided that he felt too bad and did not go to dialysis today.  His lungs are clear to auscultation bilaterally.  Question volume overload however given new increased O2 demand? Apparently was satting 85% on his 2L per [facility] staff. Will work-up for abdominal pain and shortness of breath today.  If lab work and CT scan unremarkable and patient able to tolerate p.o. we will discharge home.  May consult renal  navigator to see if he can get dialysis tomorrow as he missed today.  I, Dantonio Justen [employer], personally reviewed and evaluated these images and lab results as part of my medical decision-making.  CXR: IMPRESSION:  CHF with bilateral pleural effusions and overlying atelectasis.   CT: IMPRESSION:  1. No definite noncontrast CT findings of the abdomen or pelvis to  explain  abdominal pain.  2. Large burden of stool and stool balls throughout the colon.  3. Moderate bilateral pleural effusions and associated atelectasis  or consolidation, improved compared to prior examination.  4. Mild cardiomegaly.     Aortic Atherosclerosis (ICD10-I70.0).   Pt reports he last had a BM yesterday. He does report issues with constipation frequently. No large stool ball appreciated on CT scan; seems to have smaller balls throughout the colon. Does not appear to be on a bowel prep at [facility]?  CBC without leukocytosis. Hgb stable at 11.5 CMP with creatinine 5.55; potassium WNL at 4.0.  Lipase 21 BNP 188.6; significantly improved from previous Troponin 19; will plan to repeat however does appear flat compared to previous  Pt weaned to his 2L o2 - satting 98% and comfortable.   Discussed case with Renal Navigator Melven Sartorius - patient to be dialyzed tomorrow at [facility] (his regular dialysis center). Needs to be there at 11:25 AM for an 11:40 AM start. Will make facility aware upon discharge.   Repeat troponin downtrending at 17.  Pt maintained O2 sats at 97-100% on his 2L.  He has been able to tolerate PO here without difficulty.  Will discharge back to facility at this time. I have attempted to contact facility to speak with physician or NP however it appears they left for the day. We did discuss potential of patient going back to facility if labwork unremarkable. I do feel he is stable for discharge home at this time with plans for dialysis tomorrow AM. [facility] to take patient back to facility - will make them aware of need for dialysis tomorrow.   This note was prepared using Dragon voice recognition software and may include unintentional dictation errors due to the inherent limitations of voice recognition software.   Final Clinical Impression(s) / ED Diagnoses Final diagnoses:  Generalized abdominal pain  Constipation, unspecified constipation type  SOB  (shortness of breath)    Rx / DC Orders ED Discharge Orders     None        Discharge Instructions      He has been rescheduled for dialysis tomorrow morning at [facility]. Patient needs to be there at 11:25 AM for an 11:40 AM start time.   The workup was overall reassuring in the ED today without any acute findings. The CT scan of the abdomen showed moderate constipation throughout. Patient should be on a bowel regimen as this is likely the cause of his abdominal pain.   He has maintained oxygen saturations above 95% on his 2L while in the ED. His chest xray showed small bilateral pleural effusions.   Please have patient follow up with PCP for further evaluation.         Eustaquio Maize, PA-C 03/16/21 1641    Charlesetta Shanks, MD 03/18/21 [phone]

## 2021-03-16 NOTE — ED Triage Notes (Signed)
Pt to er room number 7 via ems, states that he is from [place], states that pt is here for abd pain, pt states that he ate something last night that made him feel poorly, states that his stomach has been hurting.  Pt states that he is normally on 2L via [place] at home, and gets mwf dialysis.  Pt oriented to person, place and day of the week

## 2021-03-16 NOTE — ED Notes (Signed)
Pt tolerated fluids well wo c/o  N/V.

## 2021-03-16 NOTE — ED Notes (Signed)
[facility] Called for pt 20 or more in front of pt

## 2021-03-16 NOTE — Progress Notes (Signed)
Contacted by ED provider to request that pt's out-pt HD clinic be contacted to see if pt can receive HD tomorrow since pt missed today's treatment. Spoke to [employer] at [facility]. Pt can receive HD treatment tomorrow. Pt needs to arrive at 11:25 for 11:40 chair time. Pt normally receives HD on MWF around 12:45pm. Clinic is expecting pt tomorrow for treatment. Above information provided to ED provider who will provide info to pt's snf.  Melven Sartorius Renal Navigator [phone]

## 2021-03-26 ENCOUNTER — Inpatient Hospital Stay (HOSPITAL_COMMUNITY): Payer: Medicare Other

## 2021-03-26 ENCOUNTER — Inpatient Hospital Stay (HOSPITAL_COMMUNITY)
Admission: EM | Admit: 2021-03-26 | Discharge: 2021-04-10 | DRG: 177 | Disposition: A | Discharge status: Skilled Nursing Facility | Payer: Medicare Other | Source: Skilled Nursing Facility | Attending: Family Medicine | Admitting: Family Medicine

## 2021-03-26 ENCOUNTER — Other Ambulatory Visit: Payer: Self-pay

## 2021-03-26 ENCOUNTER — Emergency Department (HOSPITAL_COMMUNITY): Payer: Medicare Other

## 2021-03-26 ENCOUNTER — Encounter (HOSPITAL_COMMUNITY): Payer: Self-pay

## 2021-03-26 DIAGNOSIS — U071 COVID-19: Principal | ICD-10-CM | POA: Diagnosis present

## 2021-03-26 DIAGNOSIS — J9621 Acute and chronic respiratory failure with hypoxia: Secondary | ICD-10-CM | POA: Diagnosis present

## 2021-03-26 DIAGNOSIS — E8770 Fluid overload, unspecified: Secondary | ICD-10-CM

## 2021-03-26 DIAGNOSIS — H5462 Unqualified visual loss, left eye, normal vision right eye: Secondary | ICD-10-CM | POA: Diagnosis present

## 2021-03-26 DIAGNOSIS — Z79899 Other long term (current) drug therapy: Secondary | ICD-10-CM

## 2021-03-26 DIAGNOSIS — Z992 Dependence on renal dialysis: Secondary | ICD-10-CM

## 2021-03-26 DIAGNOSIS — I5033 Acute on chronic diastolic (congestive) heart failure: Secondary | ICD-10-CM | POA: Diagnosis present

## 2021-03-26 DIAGNOSIS — K3184 Gastroparesis: Secondary | ICD-10-CM | POA: Diagnosis present

## 2021-03-26 DIAGNOSIS — Z794 Long term (current) use of insulin: Secondary | ICD-10-CM | POA: Diagnosis not present

## 2021-03-26 DIAGNOSIS — E11649 Type 2 diabetes mellitus with hypoglycemia without coma: Secondary | ICD-10-CM | POA: Diagnosis not present

## 2021-03-26 DIAGNOSIS — E871 Hypo-osmolality and hyponatremia: Secondary | ICD-10-CM | POA: Diagnosis present

## 2021-03-26 DIAGNOSIS — N186 End stage renal disease: Secondary | ICD-10-CM | POA: Diagnosis present

## 2021-03-26 DIAGNOSIS — Z885 Allergy status to narcotic agent status: Secondary | ICD-10-CM

## 2021-03-26 DIAGNOSIS — Z7982 Long term (current) use of aspirin: Secondary | ICD-10-CM

## 2021-03-26 DIAGNOSIS — I2721 Secondary pulmonary arterial hypertension: Secondary | ICD-10-CM | POA: Diagnosis present

## 2021-03-26 DIAGNOSIS — Z7984 Long term (current) use of oral hypoglycemic drugs: Secondary | ICD-10-CM

## 2021-03-26 DIAGNOSIS — Z833 Family history of diabetes mellitus: Secondary | ICD-10-CM

## 2021-03-26 DIAGNOSIS — Z9981 Dependence on supplemental oxygen: Secondary | ICD-10-CM

## 2021-03-26 DIAGNOSIS — E1141 Type 2 diabetes mellitus with diabetic mononeuropathy: Secondary | ICD-10-CM | POA: Diagnosis present

## 2021-03-26 DIAGNOSIS — E1143 Type 2 diabetes mellitus with diabetic autonomic (poly)neuropathy: Secondary | ICD-10-CM | POA: Diagnosis present

## 2021-03-26 DIAGNOSIS — Z9115 Patient's noncompliance with renal dialysis: Secondary | ICD-10-CM

## 2021-03-26 DIAGNOSIS — I132 Hypertensive heart and chronic kidney disease with heart failure and with stage 5 chronic kidney disease, or end stage renal disease: Secondary | ICD-10-CM | POA: Diagnosis present

## 2021-03-26 DIAGNOSIS — R0602 Shortness of breath: Secondary | ICD-10-CM

## 2021-03-26 DIAGNOSIS — J9601 Acute respiratory failure with hypoxia: Secondary | ICD-10-CM | POA: Diagnosis not present

## 2021-03-26 DIAGNOSIS — E1122 Type 2 diabetes mellitus with diabetic chronic kidney disease: Secondary | ICD-10-CM | POA: Diagnosis present

## 2021-03-26 DIAGNOSIS — J1282 Pneumonia due to coronavirus disease 2019: Secondary | ICD-10-CM | POA: Diagnosis present

## 2021-03-26 DIAGNOSIS — Z8249 Family history of ischemic heart disease and other diseases of the circulatory system: Secondary | ICD-10-CM

## 2021-03-26 DIAGNOSIS — E1165 Type 2 diabetes mellitus with hyperglycemia: Secondary | ICD-10-CM | POA: Diagnosis present

## 2021-03-26 DIAGNOSIS — E785 Hyperlipidemia, unspecified: Secondary | ICD-10-CM | POA: Diagnosis present

## 2021-03-26 DIAGNOSIS — D631 Anemia in chronic kidney disease: Secondary | ICD-10-CM | POA: Diagnosis present

## 2021-03-26 DIAGNOSIS — R0902 Hypoxemia: Secondary | ICD-10-CM | POA: Diagnosis not present

## 2021-03-26 DIAGNOSIS — I5032 Chronic diastolic (congestive) heart failure: Secondary | ICD-10-CM | POA: Diagnosis not present

## 2021-03-26 DIAGNOSIS — E8779 Other fluid overload: Secondary | ICD-10-CM | POA: Diagnosis not present

## 2021-03-26 DIAGNOSIS — Z87891 Personal history of nicotine dependence: Secondary | ICD-10-CM

## 2021-03-26 DIAGNOSIS — N2581 Secondary hyperparathyroidism of renal origin: Secondary | ICD-10-CM | POA: Diagnosis present

## 2021-03-26 DIAGNOSIS — Z9889 Other specified postprocedural states: Secondary | ICD-10-CM

## 2021-03-26 DIAGNOSIS — Z91119 Patient's noncompliance with dietary regimen due to unspecified reason: Secondary | ICD-10-CM

## 2021-03-26 HISTORY — DX: Acute respiratory failure with hypoxia: J96.01

## 2021-03-26 HISTORY — DX: End stage renal disease: N18.6

## 2021-03-26 LAB — TROPONIN I (HIGH SENSITIVITY)
Troponin I (High Sensitivity): 29 ng/L — ABNORMAL HIGH (ref <18)
Troponin I (High Sensitivity): 30 ng/L — ABNORMAL HIGH (ref <18)

## 2021-03-26 LAB — CBC WITH DIFFERENTIAL/PLATELET
Abs Immature Granulocytes: 0.07 10*3/uL (ref 0.00–0.07)
Basophils Absolute: 0 10*3/uL (ref 0.0–0.1)
Basophils Relative: 0 %
Eosinophils Absolute: 0 10*3/uL (ref 0.0–0.5)
Eosinophils Relative: 0 %
HCT: 39 % (ref 39.0–52.0)
Hemoglobin: 11.7 g/dL — ABNORMAL LOW (ref 13.0–17.0)
Immature Granulocytes: 1 %
Lymphocytes Relative: 5 %
Lymphs Abs: 0.6 10*3/uL — ABNORMAL LOW (ref 0.7–4.0)
MCH: 27.7 pg (ref 26.0–34.0)
MCHC: 30 g/dL (ref 30.0–36.0)
MCV: 92.2 fL (ref 80.0–100.0)
Monocytes Absolute: 1.1 10*3/uL — ABNORMAL HIGH (ref 0.1–1.0)
Monocytes Relative: 10 %
Neutro Abs: 9.5 10*3/uL — ABNORMAL HIGH (ref 1.7–7.7)
Neutrophils Relative %: 84 %
Platelets: 165 10*3/uL (ref 150–400)
RBC: 4.23 MIL/uL (ref 4.22–5.81)
RDW: 15.6 % — ABNORMAL HIGH (ref 11.5–15.5)
WBC: 11.4 10*3/uL — ABNORMAL HIGH (ref 4.0–10.5)
nRBC: 0 % (ref 0.0–0.2)

## 2021-03-26 LAB — COMPREHENSIVE METABOLIC PANEL
ALT: 16 U/L (ref 0–44)
AST: 28 U/L (ref 15–41)
Albumin: 3.9 g/dL (ref 3.5–5.0)
Alkaline Phosphatase: 88 U/L (ref 38–126)
Anion gap: 17 — ABNORMAL HIGH (ref 5–15)
BUN: 45 mg/dL — ABNORMAL HIGH (ref 6–20)
CO2: 24 mmol/L (ref 22–32)
Calcium: 9.3 mg/dL (ref 8.9–10.3)
Chloride: 95 mmol/L — ABNORMAL LOW (ref 98–111)
Creatinine, Ser: 6.1 mg/dL — ABNORMAL HIGH (ref 0.61–1.24)
GFR, Estimated: 11 mL/min — ABNORMAL LOW (ref >60)
Glucose, Bld: 122 mg/dL — ABNORMAL HIGH (ref 70–99)
Potassium: 5.2 mmol/L — ABNORMAL HIGH (ref 3.5–5.1)
Sodium: 136 mmol/L (ref 135–145)
Total Bilirubin: 1.2 mg/dL (ref 0.3–1.2)
Total Protein: 8.9 g/dL — ABNORMAL HIGH (ref 6.5–8.1)

## 2021-03-26 LAB — GLUCOSE, CAPILLARY
Glucose-Capillary: 130 mg/dL — ABNORMAL HIGH (ref 70–99)
Glucose-Capillary: 198 mg/dL — ABNORMAL HIGH (ref 70–99)
Glucose-Capillary: 210 mg/dL — ABNORMAL HIGH (ref 70–99)

## 2021-03-26 LAB — MRSA NEXT GEN BY PCR, NASAL: MRSA by PCR Next Gen: DETECTED — AB

## 2021-03-26 LAB — I-STAT CHEM 8, ED
BUN: 43 mg/dL — ABNORMAL HIGH (ref 6–20)
Calcium, Ion: 1.09 mmol/L — ABNORMAL LOW (ref 1.15–1.40)
Chloride: 101 mmol/L (ref 98–111)
Creatinine, Ser: 6.1 mg/dL — ABNORMAL HIGH (ref 0.61–1.24)
Glucose, Bld: 120 mg/dL — ABNORMAL HIGH (ref 70–99)
HCT: 40 % (ref 39.0–52.0)
Hemoglobin: 13.6 g/dL (ref 13.0–17.0)
Potassium: 5.2 mmol/L — ABNORMAL HIGH (ref 3.5–5.1)
Sodium: 135 mmol/L (ref 135–145)
TCO2: 27 mmol/L (ref 22–32)

## 2021-03-26 LAB — HEMOGLOBIN A1C
Hgb A1c MFr Bld: 4.6 % — ABNORMAL LOW (ref 4.8–5.6)
Mean Plasma Glucose: 85.32 mg/dL

## 2021-03-26 LAB — RESP PANEL BY RT-PCR (FLU A&B, COVID) ARPGX2
Influenza A by PCR: NEGATIVE
Influenza B by PCR: NEGATIVE
SARS Coronavirus 2 by RT PCR: POSITIVE — AB

## 2021-03-26 LAB — HIV ANTIBODY (ROUTINE TESTING W REFLEX): HIV Screen 4th Generation wRfx: NONREACTIVE

## 2021-03-26 LAB — PROCALCITONIN: Procalcitonin: 0.86 ng/mL

## 2021-03-26 LAB — CBG MONITORING, ED: Glucose-Capillary: 98 mg/dL (ref 70–99)

## 2021-03-26 MED ORDER — NITROGLYCERIN 2 % TD OINT
1.0000 [in_us] | TOPICAL_OINTMENT | Freq: Once | TRANSDERMAL | Status: AC
  Administered 2021-03-26: 1 [in_us] via TOPICAL
  Filled 2021-03-26: qty 1

## 2021-03-26 MED ORDER — FENTANYL CITRATE PF 50 MCG/ML IJ SOSY
50.0000 ug | PREFILLED_SYRINGE | Freq: Once | INTRAMUSCULAR | Status: AC
  Administered 2021-03-26: 50 ug via INTRAVENOUS
  Filled 2021-03-26: qty 1

## 2021-03-26 MED ORDER — SODIUM CHLORIDE 0.9 % IV SOLN
200.0000 mg | Freq: Once | INTRAVENOUS | Status: AC
  Administered 2021-03-26: 200 mg via INTRAVENOUS
  Filled 2021-03-26: qty 40

## 2021-03-26 MED ORDER — PREDNISONE 20 MG PO TABS: 50.0000 mg | ORAL_TABLET | Freq: Every day | ORAL | Status: DC

## 2021-03-26 MED ORDER — ACETAMINOPHEN 325 MG PO TABS
650.0000 mg | ORAL_TABLET | Freq: Four times a day (QID) | ORAL | Status: DC | PRN
  Administered 2021-03-27: 650 mg via ORAL
  Filled 2021-03-26: qty 2

## 2021-03-26 MED ORDER — CHLORHEXIDINE GLUCONATE CLOTH 2 % EX PADS
6.0000 | MEDICATED_PAD | Freq: Every day | CUTANEOUS | Status: DC
  Administered 2021-03-26 – 2021-04-05 (×11): 6 via TOPICAL

## 2021-03-26 MED ORDER — HEPARIN SODIUM (PORCINE) 5000 UNIT/ML IJ SOLN
5000.0000 [IU] | Freq: Three times a day (TID) | INTRAMUSCULAR | Status: DC
  Administered 2021-03-26: 5000 [IU] via SUBCUTANEOUS
  Filled 2021-03-26: qty 1

## 2021-03-26 MED ORDER — INSULIN ASPART 100 UNIT/ML IJ SOLN
0.0000 [IU] | INTRAMUSCULAR | Status: DC
  Administered 2021-03-26 – 2021-03-27 (×2): 2 [IU] via SUBCUTANEOUS
  Administered 2021-03-27: 1 [IU] via SUBCUTANEOUS
  Administered 2021-03-27: 2 [IU] via SUBCUTANEOUS
  Administered 2021-03-27 – 2021-03-29 (×4): 1 [IU] via SUBCUTANEOUS
  Administered 2021-03-29: 5 [IU] via SUBCUTANEOUS
  Administered 2021-03-29: 3 [IU] via SUBCUTANEOUS
  Administered 2021-03-30: 4 [IU] via SUBCUTANEOUS
  Administered 2021-03-30: 3 [IU] via SUBCUTANEOUS
  Administered 2021-03-30: 4 [IU] via SUBCUTANEOUS
  Administered 2021-03-31: 1 [IU] via SUBCUTANEOUS
  Administered 2021-03-31: 4 [IU] via SUBCUTANEOUS
  Administered 2021-03-31: 2 [IU] via SUBCUTANEOUS
  Administered 2021-03-31: 5 [IU] via SUBCUTANEOUS
  Administered 2021-04-01: 2 [IU] via SUBCUTANEOUS
  Administered 2021-04-01: 1 [IU] via SUBCUTANEOUS
  Administered 2021-04-01: 3 [IU] via SUBCUTANEOUS
  Administered 2021-04-01: 2 [IU] via SUBCUTANEOUS
  Administered 2021-04-02 (×3): 3 [IU] via SUBCUTANEOUS
  Administered 2021-04-02: 1 [IU] via SUBCUTANEOUS
  Administered 2021-04-02 – 2021-04-03 (×2): 3 [IU] via SUBCUTANEOUS
  Administered 2021-04-03: 5 [IU] via SUBCUTANEOUS
  Administered 2021-04-03: 2 [IU] via SUBCUTANEOUS
  Administered 2021-04-03 (×2): 1 [IU] via SUBCUTANEOUS
  Administered 2021-04-03: 4 [IU] via SUBCUTANEOUS
  Administered 2021-04-04 (×2): 1 [IU] via SUBCUTANEOUS
  Administered 2021-04-04: 4 [IU] via SUBCUTANEOUS
  Administered 2021-04-04: 3 [IU] via SUBCUTANEOUS
  Administered 2021-04-04: 1 [IU] via SUBCUTANEOUS
  Administered 2021-04-05: 3 [IU] via SUBCUTANEOUS
  Administered 2021-04-05 (×2): 1 [IU] via SUBCUTANEOUS
  Administered 2021-04-06: 2 [IU] via SUBCUTANEOUS
  Administered 2021-04-06: 6 [IU] via SUBCUTANEOUS
  Administered 2021-04-06: 2 [IU] via SUBCUTANEOUS
  Administered 2021-04-07: 3 [IU] via SUBCUTANEOUS
  Administered 2021-04-07: 2 [IU] via SUBCUTANEOUS
  Administered 2021-04-07: 4 [IU] via SUBCUTANEOUS
  Administered 2021-04-07: 1 [IU] via SUBCUTANEOUS
  Administered 2021-04-08: 3 [IU] via SUBCUTANEOUS
  Administered 2021-04-08: 4 [IU] via SUBCUTANEOUS
  Administered 2021-04-08: 5 [IU] via SUBCUTANEOUS
  Administered 2021-04-08: 2 [IU] via SUBCUTANEOUS
  Administered 2021-04-08: 4 [IU] via SUBCUTANEOUS
  Administered 2021-04-08: 2 [IU] via SUBCUTANEOUS
  Administered 2021-04-09: 1 [IU] via SUBCUTANEOUS
  Administered 2021-04-09: 5 [IU] via SUBCUTANEOUS
  Administered 2021-04-10: 1 [IU] via SUBCUTANEOUS
  Administered 2021-04-10: 4 [IU] via SUBCUTANEOUS

## 2021-03-26 MED ORDER — HYDROCOD POLST-CPM POLST ER 10-8 MG/5ML PO SUER
5.0000 mL | Freq: Two times a day (BID) | ORAL | Status: DC | PRN
Start: 2021-03-26 — End: 2021-04-10

## 2021-03-26 MED ORDER — TOCILIZUMAB 400 MG/20ML IV SOLN
8.0000 mg/kg | Freq: Once | INTRAVENOUS | Status: AC
  Administered 2021-03-26: 472 mg via INTRAVENOUS
  Filled 2021-03-26: qty 20

## 2021-03-26 MED ORDER — DOCUSATE SODIUM 100 MG PO CAPS
100.0000 mg | ORAL_CAPSULE | Freq: Two times a day (BID) | ORAL | Status: DC
  Administered 2021-03-26 – 2021-04-10 (×15): 100 mg via ORAL
  Filled 2021-03-26 (×24): qty 1

## 2021-03-26 MED ORDER — GUAIFENESIN-DM 100-10 MG/5ML PO SYRP: 10.0000 mL | ORAL_SOLUTION | ORAL | Status: DC | PRN

## 2021-03-26 MED ORDER — METOPROLOL TARTRATE 5 MG/5ML IV SOLN
2.5000 mg | INTRAVENOUS | Status: DC | PRN
  Administered 2021-03-26: 2.5 mg via INTRAVENOUS
  Administered 2021-03-26 – 2021-03-28 (×4): 5 mg via INTRAVENOUS
  Filled 2021-03-26 (×5): qty 5

## 2021-03-26 MED ORDER — SODIUM CHLORIDE 0.9 % IV SOLN
1.0000 g | Freq: Once | INTRAVENOUS | Status: AC
  Administered 2021-03-26: 1 g via INTRAVENOUS
  Filled 2021-03-26: qty 10

## 2021-03-26 MED ORDER — ONDANSETRON HCL 4 MG/2ML IJ SOLN: 4.0000 mg | Freq: Four times a day (QID) | INTRAMUSCULAR | Status: DC | PRN

## 2021-03-26 MED ORDER — POLYETHYLENE GLYCOL 3350 17 G PO PACK: 17.0000 g | PACK | Freq: Every day | ORAL | Status: DC | PRN

## 2021-03-26 MED ORDER — METHYLPREDNISOLONE SODIUM SUCC 40 MG IJ SOLR
0.5000 mg/kg | Freq: Two times a day (BID) | INTRAMUSCULAR | Status: DC
  Administered 2021-03-26 (×2): 29.6 mg via INTRAVENOUS
  Filled 2021-03-26 (×2): qty 1

## 2021-03-26 MED ORDER — FENTANYL CITRATE (PF) 100 MCG/2ML IJ SOLN
50.0000 ug | INTRAMUSCULAR | Status: DC | PRN
  Administered 2021-03-26 – 2021-03-28 (×12): 50 ug via INTRAVENOUS
  Filled 2021-03-26 (×12): qty 2

## 2021-03-26 MED ORDER — HEPARIN SODIUM (PORCINE) 5000 UNIT/ML IJ SOLN
5000.0000 [IU] | Freq: Three times a day (TID) | INTRAMUSCULAR | Status: DC
  Administered 2021-03-26 – 2021-04-03 (×23): 5000 [IU] via SUBCUTANEOUS
  Filled 2021-03-26 (×28): qty 1

## 2021-03-26 MED ORDER — SODIUM CHLORIDE 0.9 % IV SOLN
100.0000 mg | Freq: Every day | INTRAVENOUS | Status: AC
  Administered 2021-03-27 – 2021-03-30 (×4): 100 mg via INTRAVENOUS
  Filled 2021-03-26 (×5): qty 20

## 2021-03-26 MED ORDER — ONDANSETRON HCL 4 MG PO TABS
4.0000 mg | ORAL_TABLET | Freq: Four times a day (QID) | ORAL | Status: DC | PRN
  Administered 2021-04-09 – 2021-04-10 (×2): 4 mg via ORAL
  Filled 2021-03-26 (×2): qty 1

## 2021-03-26 MED ORDER — CALCIUM ACETATE (PHOS BINDER) 667 MG PO CAPS
667.0000 mg | ORAL_CAPSULE | Freq: Three times a day (TID) | ORAL | Status: DC
  Administered 2021-03-26 – 2021-04-02 (×19): 667 mg via ORAL
  Filled 2021-03-26 (×21): qty 1

## 2021-03-26 MED ORDER — ACETAMINOPHEN 500 MG PO TABS
1000.0000 mg | ORAL_TABLET | Freq: Once | ORAL | Status: DC
Start: 2021-03-26 — End: 2021-04-10

## 2021-03-26 NOTE — Consult Note (Signed)
ESRD Consult Note  Assessment/Recommendations:   # ESRD:  Outpatient orders: [facility], MWF, 4 hrs, F180, 2k, 2cal, edw 53kg, no heparin -will resume MWF schedule  # AHRF COVID positive 03/24/2021 Pulmonary edema, left pleural effusion -remdesivir/tocilizumab per primary service, on solumedrol -currently on BIPAP -thora 10/10: 1200cc drained  # Volume/ hypertension: EDW 53kg. UF 4-5L as tolerated today  # Anemia of Chronic Kidney Disease: Hemoglobin 11.7 on presentation. Receiving Mircera 45mg qtreatment, last dose 10/3 as an outpatient. Hold for now  # Secondary Hyperparathyroidism/Hyperphosphatemia: resume home binders   # Vascular access: RUE AVF  # Additional recommendations: - Dose all meds for creatinine clearance < 10 ml/min  - Unless absolutely necessary, no MRIs with gadolinium.  - Implement save arm precautions.  Prefer needle sticks in Christopher dorsum of Christopher hands or wrists.  No blood pressure measurements in arm. - If blood transfusion is requested during hemodialysis sessions, please alert u[place]oreaprior to Christopher session.  - If a hemodialysis catheter line culture is requested, please alert u[place]as only hemodialysis nurses are able to collect those specimens.   Recommendations were discussed with [facility] primary team.  VGean Quint MD [facility]   History of Present Illness: THeman Davila a/an 50y.o. male with a past medical history of ESRD on IHD, HTN, DM, diastolic CHF, chronic anemia who presents with SOB, was COVID positive at facility ([facility] on 10/8. Hypertensive, CXR concerning for volume overload. Has chronic left pleural effusion, has required thoracentesis in Christopher past. Required bipap, now back on [place] during my encounter. Thora performed this AM, removed 1200cc of fluid. Patient reports feeling slightly better in regards to breathing.   Medications:  Current Facility-Administered Medications  Medication Dose Route Frequency Provider Last Rate Last  Admin   acetaminophen (TYLENOL) tablet 1,000 mg  1,000 mg Oral Once Cardama, PGrayce Sessions MD       acetaminophen (TYLENOL) tablet 650 mg  650 mg Oral Q6H PRN Ollis, Brandi L, NP       Chlorhexidine Gluconate Cloth 2 % PADS 6 each  6 each Topical Q0600 SGean Quint MD       chlorpheniramine-HYDROcodone (TUSSIONEX) 10-8 MG/5ML suspension 5 mL  5 mL Oral Q12H PRN Ollis, Brandi L, NP       docusate sodium (COLACE) capsule 100 mg  100 mg Oral BID Ollis, Brandi L, NP       guaiFENesin-dextromethorphan (ROBITUSSIN DM) 100-10 MG/5ML syrup 10 mL  10 mL Oral Q4H PRN Ollis, Brandi L, NP       heparin injection 5,000 Units  5,000 Units Subcutaneous Q8H Ollis, Brandi L, NP       insulin aspart (novoLOG) injection 0-6 Units  0-6 Units Subcutaneous Q4H Ollis, Brandi L, NP       methylPREDNISolone sodium succinate (SOLU-MEDROL) 40 mg/mL injection 29.6 mg  0.5 mg/kg Intravenous Q12H Ollis, Brandi L, NP       Followed by   [Derrill MemoON 03/29/2021] predniSONE (DELTASONE) tablet 50 mg  50 mg Oral Daily Ollis, Brandi L, NP       metoprolol tartrate (LOPRESSOR) injection 2.5-5 mg  2.5-5 mg Intravenous Q3H PRN Ollis, Brandi L, NP       ondansetron (ZOFRAN) tablet 4 mg  4 mg Oral Q6H PRN Ollis, Brandi L, NP       Or   ondansetron (ZOFRAN) injection 4 mg  4 mg Intravenous Q6H PRN Ollis, Brandi L, NP       polyethylene glycol (MIRALAX /  GLYCOLAX) packet 17 g  17 g Oral Daily PRN Ollis, Brandi L, NP       remdesivir 200 mg in sodium chloride 0.9% 250 mL IVPB  200 mg Intravenous Once Ollis, Brandi L, NP       Followed by   Derrill Memo ON 03/27/2021] remdesivir 100 mg in sodium chloride 0.9 % 100 mL IVPB  100 mg Intravenous Daily Ollis, Brandi L, NP       tocilizumab (ACTEMRA) 8 mg/kg = 472 mg in sodium chloride 0.9 % 100 mL infusion  8 mg/kg Intravenous Once Noe Gens L, NP       Current Outpatient Medications  Medication Sig Dispense Refill   Amino Acids-Protein Hydrolys (FEEDING SUPPLEMENT, PRO-STAT SUGAR FREE 64,)  LIQD Take 30 mLs by mouth 2 (two) times daily.     amLODipine (NORVASC) 10 MG tablet Take 1 tablet (10 mg total) by mouth daily.     aspirin EC 81 MG tablet Take 1 tablet (81 mg total) by mouth daily.     atorvastatin (LIPITOR) 40 MG tablet Take 1 tablet (40 mg total) by mouth daily. 90 tablet 3   brimonidine (ALPHAGAN P) 0.1 % SOLN Place 1 drop into Christopher left eye in Christopher morning and at bedtime.     calcium acetate (PHOSLO) 667 MG capsule Take 1 capsule (667 mg total) by mouth 3 (three) times daily with meals.     dorzolamide-timolol (COSOPT) 22.3-6.8 MG/ML ophthalmic solution Place 1 drop into Christopher left eye 2 (two) times daily. 10 mL 12   ferrous sulfate 325 (65 FE) MG tablet Take 325 mg by mouth daily.     hydrALAZINE (APRESOLINE) 50 MG tablet Take 50 mg by mouth 3 (three) times daily.     latanoprost (XALATAN) 0.005 % ophthalmic solution Place 1 drop into Christopher left eye at bedtime. 2.5 mL 12   losartan (COZAAR) 100 MG tablet Take 1 tablet (100 mg total) by mouth daily.     Melatonin 10 MG CAPS Take 10 mg by mouth at bedtime.     olopatadine (PATANOL) 0.1 % ophthalmic solution Place 2 drops into both eyes daily.     oxyCODONE-acetaminophen (PERCOCET/ROXICET) 5-325 MG tablet Take 1 tablet by mouth See admin instructions. 1 tablet once daily every Tuesday, Thursday, Saturday, and Sunday     OXYGEN Inhale 3 L into Christopher lungs continuous.     polyethylene glycol (MIRALAX / GLYCOLAX) 17 g packet Take 17 g by mouth daily.     potassium chloride SA (KLOR-CON) 20 MEQ tablet Take 20 mEq by mouth daily.     pregabalin (LYRICA) 25 MG capsule Take 25 mg by mouth 2 (two) times daily.     senna (SENOKOT) 8.6 MG TABS tablet Take 2 tablets by mouth at bedtime.     sertraline (ZOLOFT) 25 MG tablet Take 25 mg by mouth daily. On Monday's, Wednesday's, and Friday's, take after dialysis. All other days take in Christopher morning.     torsemide (DEMADEX) 20 MG tablet Take 80 mg by mouth 2 (two) times daily.     Vitamin D,  Ergocalciferol, (DRISDOL) 1.25 MG (50000 UNIT) CAPS capsule Take 50,000 Units by mouth every 7 (seven) days. Sunday's     acetaminophen (TYLENOL) 500 MG tablet Take 1,000 mg by mouth every 6 (six) hours as needed for mild pain or moderate pain.     brimonidine (ALPHAGAN) 0.2 % ophthalmic solution Place 1 drop into Christopher left eye 2 (two) times daily.  Darbepoetin Alfa (ARANESP) 60 MCG/0.3ML SOSY injection Inject 0.3 mLs (60 mcg total) into Christopher vein every Tuesday with hemodialysis. 4.2 mL    gabapentin (NEURONTIN) 400 MG capsule Take 400 mg by mouth 3 (three) times daily.     glipiZIDE (GLUCOTROL) 10 MG tablet Take 1 tablet (10 mg total) by mouth 2 (two) times daily.     hydrALAZINE (APRESOLINE) 25 MG tablet Take 0.5 tablets (12.5 mg total) by mouth 3 (three) times daily.     lidocaine-prilocaine (EMLA) cream Apply topically.     methocarbamol (ROBAXIN) 750 MG tablet Take 750 mg by mouth daily as needed for muscle spasms.     Methoxy PEG-Epoetin Beta (MIRCERA IJ) Mircera     moxifloxacin (VIGAMOX) 0.5 % ophthalmic solution Place 1 drop into Christopher left eye 2 (two) times daily.      sodium bicarbonate 650 MG tablet Take 1 tablet (650 mg total) by mouth 3 (three) times daily. 90 tablet 0   torsemide (DEMADEX) 100 MG tablet Take 1 tablet (100 mg total) by mouth daily.       ALLERGIES Morphine and related  MEDICAL HISTORY Past Medical History:  Diagnosis Date   Anemia    Blindness 09/04/2019   CHF (congestive heart failure) (HCC)    Diabetes mellitus    Diarrhea 10/14/2019   Epigastric abdominal pain    ESRD (end stage renal disease) (HCC)    Gastroparesis due to DM (HCC)    Hypertension    Hypoalbuminemia 04/17/2019   Hypoxia    Neuropathy of lower extremity    Oral candidiasis 06/26/2012   Pancreatitis 06/26/2012   Weight loss 06/26/2012     SOCIAL HISTORY Social History   Socioeconomic History   Marital status: Single    Spouse name: Not on file   Number of children: Not on  file   Years of education: Not on file   Highest education level: Not on file  Occupational History   Occupation: dish washer, unemployed  Tobacco Use   Smoking status: Former    Packs/day: 0.00    Types: Cigarettes    Quit date: 03/22/2015    Years since quitting: 6.0   Smokeless tobacco: Never  Vaping Use   Vaping Use: Never used  Substance and Sexual Activity   Alcohol use: Not Currently   Drug use: No   Sexual activity: Not on file  Other Topics Concern   Not on file  Social History Narrative   Not on file   Social Determinants of Health   Financial Resource Strain: Not on file  Food Insecurity: Not on file  Transportation Needs: Not on file  Physical Activity: Not on file  Stress: Not on file  Social Connections: Not on file  Intimate Partner Violence: Not on file     FAMILY HISTORY Family History  Problem Relation Age of Onset   Diabetes Mother    Lung disease Mother    Hypertension Mother    Diabetes Maternal Aunt    CAD Maternal Aunt    CAD Cousin      Review of Systems: 12 systems were reviewed and negative except per HPI  Physical Exam: Vitals:   03/26/21 1016 03/26/21 1018  BP:    Pulse:    Resp:    Temp:    SpO2: 96% 96%   No intake/output data recorded. No intake or output data in Christopher 24 hours ending 03/26/21 1113 General: NAD, sitting up in bed HEENT: anicteric sclera CV:RRR Lungs: inc'ed  WOB, diminished air entry bibasilar with fine crackles Abd: soft, non-tender, non-distended Skin: no visible lesions or rashes Ext: 1+ pitting edema b/l LE's Neuro: awake, alert Dialysis Access: RUE AVF +b/t  Test Results Reviewed Lab Results  Component Value Date   NA 135 03/26/2021   K 5.2 (H) 03/26/2021   CL 101 03/26/2021   CO2 24 03/26/2021   BUN 43 (H) 03/26/2021   CREATININE 6.10 (H) 03/26/2021   CALCIUM 9.3 03/26/2021   ALBUMIN 3.9 03/26/2021   PHOS 3.3 11/19/2019    I have reviewed relevant outside healthcare records

## 2021-03-26 NOTE — ED Provider Notes (Addendum)
[facility] EMERGENCY DEPARTMENT Provider Note  CSN: [id number] Arrival date & time:    Chief Complaint(s) Respiratory Distress  HPI Christopher Davila is a 50 y.o. male with extensive past medical history listed below including ESRD on dialysis MWF, diastolic heart failure who presents to the emergency department for 1 day of gradually worsening shortness of breath found to be hypoxic at the skilled nursing facility on his 3 L nasal cannula.  Patient was satting around 60%.  He was bumped up to 5 L with minimal improvement.  EMS was called and confirmed hypoxia.  Patient was placed on CPAP with minimal improvement.  Patient complains of shortness of breath.  No chest pain.  Per EMS patient tested positive for COVID-19 2 days ago. Per EMS they were told that patient has been compliant with his dialysis sessions.  Last dialysis was this past Friday.   Remainder of history, ROS, and physical exam limited due to patient's condition (respiratory distress). Additional information was obtained from EMS.   Level V Caveat.  HPI  Past Medical History Past Medical History:  Diagnosis Date   Abdominal pain 12/28/2012   Acute kidney injury superimposed on chronic kidney disease ([place])    Anemia    Blindness 09/04/2019   Chest pain 02/14/2014   CHF (congestive heart failure) ([place])    Diabetes mellitus    Diarrhea 10/14/2019   Dyspnea    Epigastric abdominal pain    Gastroparesis due to DM ([place])    Hypertension    Hypoalbuminemia 04/17/2019   Hypoxia    Nausea    Neuropathy of lower extremity    Oral candidiasis 06/26/2012   Pancreatitis 06/26/2012   Weight loss 06/26/2012   Wheezing    Patient Active Problem List   Diagnosis Date Noted   Pressure injury of skin 11/06/2019   ESRD on dialysis [facility])    Heart failure with preserved ejection fraction ([place])    Goals of care, counseling/discussion    Palliative care encounter    Diarrhea [id number]   Metabolic acidosis,  increased anion gap 10/13/2019   Acute urinary retention 10/13/2019   Weakness 10/05/2019   Acute on chronic diastolic CHF (congestive heart failure) ([place]) 09/27/2019   Vision loss of left eye 09/21/2019   Acute kidney injury superimposed on chronic kidney disease ([place]) 09/19/2019   Hyponatremia 09/19/2019   Homeless 09/19/2019   Anemia of chronic disease 09/19/2019   Vision loss, left eye 09/19/2019   Acute loss of vision, right 06/16/2019   Hypertensive urgency 04/17/2019   Hypokalemia 04/17/2019   Normocytic anemia 04/17/2019   Hyperglycemia    CKD (chronic kidney disease) stage 4, GFR 15-29 ml/min (HCC)    Alcohol use    Gastroparesis due to DM [facility])    Essential hypertension    Hyperlipidemia    Type 2 diabetes mellitus with hypoglycemia ([place]) 06/26/2012   Home Medication(s) Prior to Admission medications   Medication Sig Start Date End Date Taking? Authorizing Provider  acetaminophen (TYLENOL) 500 MG tablet Take 1,000 mg by mouth every 6 (six) hours as needed for mild pain or moderate pain.    [provider]  Amino Acids-Protein Hydrolys (FEEDING SUPPLEMENT, PRO-STAT SUGAR FREE 64,) LIQD Take 30 mLs by mouth 2 (two) times daily.    [provider]  amLODipine (NORVASC) 10 MG tablet Take 1 tablet (10 mg total) by mouth daily. 11/19/19   Charlynne Cousins, MD  aspirin EC 81 MG tablet Take 1 tablet (81 mg  total) by mouth daily. 05/24/15   Ghimire, Henreitta Leber, MD  atorvastatin (LIPITOR) 40 MG tablet Take 1 tablet (40 mg total) by mouth daily. 12/05/20   [place], Peter M, MD  atorvastatin (LIPITOR) 40 MG tablet Take 1 tablet (40 mg total) by mouth daily. 12/05/20 03/05/21  [place], Peter M, MD  brimonidine (ALPHAGAN) 0.2 % ophthalmic solution Place 1 drop into the left eye 2 (two) times daily.    [provider]  calcium acetate (PHOSLO) 667 MG capsule Take 1 capsule (667 mg total) by mouth 3 (three) times daily with meals. 11/19/19   Charlynne Cousins, MD   Darbepoetin Alfa (ARANESP) 60 MCG/0.3ML SOSY injection Inject 0.3 mLs (60 mcg total) into the vein every Tuesday with hemodialysis. 11/23/19   Charlynne Cousins, MD  dorzolamide-timolol (COSOPT) 22.3-6.8 MG/ML ophthalmic solution Place 1 drop into the left eye 2 (two) times daily. 09/23/19   Geradine Girt, DO  ferrous sulfate 325 (65 FE) MG tablet Take by mouth. 08/25/20   [provider]  gabapentin (NEURONTIN) 400 MG capsule Take 400 mg by mouth 3 (three) times daily.    [provider]  glipiZIDE (GLUCOTROL) 10 MG tablet Take 1 tablet (10 mg total) by mouth 2 (two) times daily. 09/23/19   Geradine Girt, DO  hydrALAZINE (APRESOLINE) 25 MG tablet Take 0.5 tablets (12.5 mg total) by mouth 3 (three) times daily. 09/23/19   Geradine Girt, DO  latanoprost (XALATAN) 0.005 % ophthalmic solution Place 1 drop into the left eye at bedtime. 09/23/19   Geradine Girt, DO  lidocaine-prilocaine (EMLA) cream Apply topically. 04/27/20   [provider]  losartan (COZAAR) 100 MG tablet Take 1 tablet (100 mg total) by mouth daily. 11/19/19   Charlynne Cousins, MD  methocarbamol (ROBAXIN) 750 MG tablet Take 750 mg by mouth daily as needed for muscle spasms.    [provider]  Methoxy PEG-Epoetin Beta (MIRCERA IJ) Mircera 07/20/20 07/19/21  [provider]  moxifloxacin (VIGAMOX) 0.5 % ophthalmic solution Place 1 drop into the left eye 2 (two) times daily.  09/24/19   [provider]  potassium chloride SA (KLOR-CON) 20 MEQ tablet Take 20 mEq by mouth daily. 10/08/20   [provider]  pregabalin (LYRICA) 25 MG capsule Take by mouth. 08/25/20   [provider]  sodium bicarbonate 650 MG tablet Take 1 tablet (650 mg total) by mouth 3 (three) times daily. 09/23/19   Geradine Girt, DO  torsemide (DEMADEX) 100 MG tablet Take 1 tablet (100 mg total) by mouth daily. 11/19/19   Charlynne Cousins, MD                                                                                                                                     Past Surgical History Past Surgical History:  Procedure Laterality Date   AV FISTULA PLACEMENT Right 10/29/2019  Procedure: RIGHT ARM BRACHIOBASILIC ARTERIOVENOUS (AV) FISTULA CREATION;  Surgeon: Rosetta Posner, MD;  Location: [facility] OR;  Service: Vascular;  Laterality: Right;   [place] Right 01/05/2020   Procedure: RIGHT ARM SECOND STAGE [place];  Surgeon: Rosetta Posner, MD;  Location: [facility] OR;  Service: Vascular;  Laterality: Right;   EYE SURGERY     IR FLUORO GUIDE CV LINE RIGHT  10/28/2019   IR US GUIDE VASC ACCESS RIGHT  10/28/2019   Family History Family History  Problem Relation Age of Onset   Diabetes Mother    Lung disease Mother    Hypertension Mother    Diabetes Maternal Aunt    CAD Maternal Aunt    CAD Cousin     Social History Social History   Tobacco Use   Smoking status: Former    Packs/day: 0.00    Types: Cigarettes    Quit date: 03/22/2015    Years since quitting: 6.0   Smokeless tobacco: Never  Vaping Use   Vaping Use: Never used  Substance Use Topics   Alcohol use: Not Currently   Drug use: No   Allergies Morphine and related  Review of Systems Review of Systems  Physical Exam Vital Signs  I have reviewed the triage vital signs BP (!) 169/108   Pulse (!) 111   Temp (!) 101 F (38.3 C)   Resp (!) 27   SpO2 93%   Physical Exam Vitals reviewed.  Constitutional:      General: He is not in acute distress.    Appearance: He is well-developed. He is not diaphoretic.  HENT:     Head: Normocephalic and atraumatic.     Nose: Nose normal.  Eyes:     General: No scleral icterus.       Right eye: No discharge.        Left eye: No discharge.     Conjunctiva/sclera: Conjunctivae normal.     Pupils: Pupils are equal, round, and reactive to light.  Cardiovascular:     Rate and Rhythm: Regular rhythm. Tachycardia present.     Heart sounds: No  murmur heard.   No friction rub. No gallop.  Pulmonary:     Effort: Pulmonary effort is normal. No respiratory distress.     Breath sounds: Decreased air movement present. No stridor. Rales (fine rales throughout) present.  Abdominal:     General: There is no distension.     Palpations: Abdomen is soft.     Tenderness: There is no abdominal tenderness.  Musculoskeletal:        General: No tenderness.     Cervical back: Normal range of motion and neck supple.  Skin:    General: Skin is warm and dry.     Findings: No erythema or rash.  Neurological:     Mental Status: He is alert and oriented to person, place, and time.    ED Results and Treatments Labs (all labs ordered are listed, but only abnormal results are displayed) Labs Reviewed  CBC WITH DIFFERENTIAL/PLATELET - Abnormal; Notable for the following components:      Result Value   WBC 11.4 (*)    Hemoglobin 11.7 (*)    RDW 15.6 (*)    Neutro Abs 9.5 (*)    Lymphs Abs 0.6 (*)    Monocytes Absolute 1.1 (*)    All other components within normal limits  COMPREHENSIVE METABOLIC PANEL - Abnormal; Notable for the following components:   Potassium 5.2 (*)  Chloride 95 (*)    Glucose, Bld 122 (*)    BUN 45 (*)    Creatinine, Ser 6.10 (*)    Total Protein 8.9 (*)    GFR, Estimated 11 (*)    Anion gap 17 (*)    All other components within normal limits  I-STAT CHEM 8, ED - Abnormal; Notable for the following components:   Potassium 5.2 (*)    BUN 43 (*)    Creatinine, Ser 6.10 (*)    Glucose, Bld 120 (*)    Calcium, Ion 1.09 (*)    All other components within normal limits  TROPONIN I (HIGH SENSITIVITY) - Abnormal; Notable for the following components:   Troponin I (High Sensitivity) 29 (*)    All other components within normal limits  RESP PANEL BY RT-PCR (FLU A&B, COVID) ARPGX2  BLOOD GAS, VENOUS  TROPONIN I (HIGH SENSITIVITY)                                                                                                                          EKG  EKG Interpretation  Date/Time:  Monday March 26 2021 05:52:56 EDT Ventricular Rate:  103 PR Interval:  155 QRS Duration: 90 QT Interval:  355 QTC Calculation: 465 R Axis:   15 Text Interpretation: Sinus tachycardia Low voltage, extremity leads Consider left ventricular hypertrophy No acute changes Confirmed by Addison Lank [phone]) on 03/26/2021 5:57:36 AM       Radiology DG Chest Portable 1 View  Result Date: 03/26/2021 CLINICAL DATA:  50 year old male dialysis patient with shortness of breath, respiratory distress. Positive COVID-19 2 days ago. EXAM: PORTABLE CHEST 1 VIEW COMPARISON:  Portable chest 03/16/2021 and earlier. FINDINGS: Chronic pleural effusions, currently moderate on the left, small on the right, and stable from last month. Associated lower lobe collapse or consolidation with increased air bronchograms from last month. Indistinct pulmonary vasculature bilaterally but new more confluent right greater than left perihilar opacity. No pneumothorax. Stable cardiac size and mediastinal contours. Visualized tracheal air column is within normal limits. No acute osseous abnormality identified. Chronic right axillary surgical clips. Paucity of bowel gas in the upper abdomen. IMPRESSION: Frequent pulmonary edema and chronic bilateral pleural effusions in this patient - not significantly changed from last month - but new more confluent right greater than left perihilar opacity is suspicious for superimposed Acute Pneumonia in this setting. And bilateral lower lobe consolidation may also be progressed. Electronically Signed   By: Genevie Ann M.D.   On: 03/26/2021 06:16    Pertinent labs & imaging results that were available during my care of the patient were reviewed by me and considered in my medical decision making (see MDM for details).  Medications Ordered in ED Medications  nitroGLYCERIN (NITROGLYN) 2 % ointment 1 inch (has no administration in time  range)  acetaminophen (TYLENOL) tablet 1,000 mg (has no administration in time range)  cefTRIAXone (ROCEPHIN) 1 g in sodium chloride 0.9 % 100 mL IVPB (has  no administration in time range)                                                                                                                                     Procedures .1-3 Lead EKG Interpretation Performed by: Fatima Blank, MD Authorized by: Fatima Blank, MD     Interpretation: abnormal     ECG rate:  111   ECG rate assessment: tachycardic     Rhythm: sinus tachycardia     Ectopy: none     Conduction: normal   .Critical Care Performed by: Fatima Blank, MD Authorized by: Fatima Blank, MD   Critical care provider statement:    Critical care time (minutes):  45   Critical care was necessary to treat or prevent imminent or life-threatening deterioration of the following conditions:  Respiratory failure   Critical care was time spent personally by me on the following activities:  Discussions with consultants, evaluation of patient's response to treatment, examination of patient, ordering and performing treatments and interventions, ordering and review of laboratory studies, ordering and review of radiographic studies, pulse oximetry, re-evaluation of patient's condition, obtaining history from patient or surrogate and review of old charts  (including critical care time)  Medical Decision Making / ED Course I have reviewed the nursing notes for this encounter and the patient's prior records (if available in EHR or on provided paperwork).  Neyo Willmarth was evaluated in Emergency Department on 03/26/2021 for the symptoms described in the history of present illness. He was evaluated in the context of the global COVID-19 pandemic, which necessitated consideration that the patient might be at risk for infection with the SARS-CoV-2 virus that causes COVID-19. Institutional protocols and algorithms  that pertain to the evaluation of patients at risk for COVID-19 are in a state of rapid change based on information released by regulatory bodies including the CDC and federal and state organizations. These policies and algorithms were followed during the patient's care in the ED.     Respiratory distress. Gradual onset per patient. History of ESRD. Reported positive COVID. Decreased air movement with fine rales throughout. Patient satting 67% on EMSs CPAP. Patient transition to BiPAP.  He immediately improved satting greater than 93%.  Screening labs and chest x-ray obtained. EKG was sinus tachycardia without evidence of acute ischemic changes, dysrhythmias, or blocks.  Pertinent labs & imaging results that were available during my care of the patient were reviewed by me and considered in my medical decision making:  Chest x-ray with diffuse pulmonary edema.  Possible superimposed pneumonia. Given Rocephin empirically. NTG paste for HTN and to shift fluid. Will confirm COVID status - no record of + results. Remdesivir if + Needs admission and HD  Final Clinical Impression(s) / ED Diagnoses Final diagnoses:  Acute respiratory failure with hypoxia ([place])     This chart was dictated using voice recognition software.  Despite best efforts  to proofread,  errors can occur which can change the documentation meaning.      Fatima Blank, MD 03/26/21 [phone]

## 2021-03-26 NOTE — Progress Notes (Signed)
Patient taken off bipap and placed on 30L heated high flow. Patient tolerating well at this time. RT will monitor.

## 2021-03-26 NOTE — Progress Notes (Signed)
Patient transported with RN to 3M13 on NRB and 8L. Patient was placed back on heated high flow when back to the unit.

## 2021-03-26 NOTE — H&P (Signed)
NAME:  Christopher Davila, MRN:  [id number], DOB:  Jan 18, 1971, LOS: 0 ADMISSION DATE:  03/26/2021, CONSULTATION DATE:  10/10 REFERRING MD:  Dr. Maryan Rued, CHIEF COMPLAINT:  SOB   History of Present Illness:  50 y/o M who presented to [facility] on 10/10 with reports of shortness of breath.   The patient lives in a facility and is baseline 3L O2 dependent.  He is HD dependent M/W/F.  The facility [facility]) called EMS for shortness of breath. He normally wears 3L oxygen, oxygen sats were in the 60's and was increased to 5L at the facility without improvement. EMS was called and confirmed hypoxia.  The patient was COVID positive 2 days prior to admit (10/8) at the facility.  On arrival to the ER, he failed salter O2 and was placed on CPAP without improvement.  He ultimately was placed on BiPAP with improvement in saturations and work of breathing.  His last HD session was on Friday 10/7.  EKG showed ST.  He was hypertensive and exam suggestive of volume overload.  CXR showed chronic left pleural effusion.  The patient pulled his biPAP off and desaturated into the 50's.  He was treated empirically with rocephin.  COVID status was confirmed positive, flu negative. Initial labs showed K 5.2, glucose 120, BUN 43, sr cr 6.10, AG 17, troponin 29, WBC 11.4, Hgb 11.7 and platelets 165.    PCCM called for ICU admission.   Pertinent  Medical History  HTN ESRD  Anemia  Blindness of Left Eye  DM  Gastroparesis secondary to DM  Pancreatitis  Diastolic CHF   Significant Hospital Events: Including procedures, antibiotic start and stop dates in addition to other pertinent events   10/10 Admit with SOB, COVID positive  Interim History / Subjective:  As above   Objective   Blood pressure (!) 167/102, pulse (!) 110, temperature (!) 101 F (38.3 C), resp. rate (!) 24, SpO2 94 %.    Vent Mode: PCV;BIPAP FiO2 (%):  [80 %] 80 % Set Rate:  [8 bmp] 8 bmp PEEP:  [8 cmH20] 8 cmH20  No intake or output data in the 24  hours ending 03/26/21 0943 There were no vitals filed for this visit.  Examination: General: adult male sitting up in bed on BiPAP HENT: MM pink / dry, BiPAP in place, anicteric  Lungs: Diminished bases, large effusion on L, mildly tachypneic Cardiovascular: Tachycardic, ext warm Abdomen: soft, +BS Extremities: 3+ edema Neuro: moves all 4 ext to command, RASS 0  K/BUN/Cr up Trop neg WBC mildly elevated  Chronic bilateral L>R effusions, superimposed airspace disease vs. more likely edema  Resolved Hospital Problem list      Assessment & Plan:   Multifactorial Acute Hypoxic Respiratory Failure- I think more likely just edema but cannot rule out COVID pneumonitis contributing Chronic Left Pleural Effusion- large, not helping things, has benefited from thora in past Hypertension  Chronic Diastolic CHF , ESRD- with volume overloaded state of heart; would be nice to know his dry weight HLD DM- sugars okay here Anemia- mild chronic  - ICU admission, continue BIPAP, monitor closely for intubation risk - Therapeutic L thora to hopefully stave off intubation - Remdesivir, steroids, actemra - ASAP HD - No obvious abx need, check Pct  Best Practice (right click and "Reselect all SmartList Selections" daily)  Diet/type: NPO DVT prophylaxis: prophylactic heparin  GI prophylaxis: N/A Lines: N/A Foley:  N/A Code Status:  full code Last date of multidisciplinary goals of care discussion: pending  Labs   CBC: Recent Labs  Lab 03/26/21 0553 03/26/21 0609  WBC 11.4*  --   NEUTROABS 9.5*  --   HGB 11.7* 13.6  HCT 39.0 40.0  MCV 92.2  --   PLT 165  --     Basic Metabolic Panel: Recent Labs  Lab 03/26/21 0553 03/26/21 0609  NA 136 135  K 5.2* 5.2*  CL 95* 101  CO2 24  --   GLUCOSE 122* 120*  BUN 45* 43*  CREATININE 6.10* 6.10*  CALCIUM 9.3  --    GFR: Estimated Creatinine Clearance: 11.8 mL/min (A) (by C-G formula based on SCr of 6.1 mg/dL (H)). Recent Labs  Lab  03/26/21 0553  WBC 11.4*    Liver Function Tests: Recent Labs  Lab 03/26/21 0553  AST 28  ALT 16  ALKPHOS 88  BILITOT 1.2  PROT 8.9*  ALBUMIN 3.9   No results for input(s): LIPASE, AMYLASE in the last 168 hours. No results for input(s): AMMONIA in the last 168 hours.  ABG    Component Value Date/Time   PHART 7.235 (L) 10/25/2019 1210   PCO2ART 55.1 (H) 10/25/2019 1210   PO2ART 127 (H) 10/25/2019 1210   HCO3 22.5 10/25/2019 1210   TCO2 27 03/26/2021 0609   ACIDBASEDEF 3.9 (H) 10/25/2019 1210   O2SAT 98.9 10/25/2019 1210     Coagulation Profile: No results for input(s): INR, PROTIME in the last 168 hours.  Cardiac Enzymes: No results for input(s): CKTOTAL, CKMB, CKMBINDEX, TROPONINI in the last 168 hours.  HbA1C: Hgb A1c MFr Bld  Date/Time Value Ref Range Status  09/19/2019 11:31 AM 6.4 (H) 4.8 - 5.6 % Final    Comment:    (NOTE) Pre diabetes:          5.7%-6.4% Diabetes:              >6.4% Glycemic control for   <7.0% adults with diabetes   06/16/2019 12:30 PM 7.6 (H) 4.8 - 5.6 % Final    Comment:    (NOTE) Pre diabetes:          5.7%-6.4% Diabetes:              >6.4% Glycemic control for   <7.0% adults with diabetes     CBG: No results for input(s): GLUCAP in the last 168 hours.  Review of Systems:   Unable to complete as patient is on BiPAP.  Information obtained from prior medical documentation and staff at bedside. Pt able to answer yes/no questions.   Past Medical History:  He,  has a past medical history of Acute kidney injury superimposed on chronic kidney disease ([place]), Anemia, Blindness (09/04/2019), CHF (congestive heart failure) ([place]), Diabetes mellitus, Diarrhea (10/14/2019), Epigastric abdominal pain, Gastroparesis due to DM [facility]), Hypertension, Hypoalbuminemia (04/17/2019), Hypoxia, Neuropathy of lower extremity, Oral candidiasis (06/26/2012), Pancreatitis (06/26/2012), and Weight loss (06/26/2012).   Surgical History:   Past Surgical  History:  Procedure Laterality Date   AV FISTULA PLACEMENT Right 10/29/2019   Procedure: RIGHT ARM BRACHIOBASILIC ARTERIOVENOUS (AV) FISTULA CREATION;  Surgeon: Rosetta Posner, MD;  Location: [place];  Service: Vascular;  Laterality: Right;   [place] Right 01/05/2020   Procedure: RIGHT ARM SECOND STAGE [place];  Surgeon: Rosetta Posner, MD;  Location: [place];  Service: Vascular;  Laterality: Right;   EYE SURGERY     IR FLUORO GUIDE CV LINE RIGHT  10/28/2019   IR US GUIDE VASC ACCESS RIGHT  10/28/2019  Social History:   reports that he quit smoking about 6 years ago. His smoking use included cigarettes. He has never used smokeless tobacco. He reports that he does not currently use alcohol. He reports that he does not use drugs.   Family History:  His family history includes CAD in his cousin and maternal aunt; Diabetes in his maternal aunt and mother; Hypertension in his mother; Lung disease in his mother.   Allergies Allergies  Allergen Reactions   Morphine And Related Itching and Other (See Comments)    Pt prefers not to be given this drug     Home Medications  Prior to Admission medications   Medication Sig Start Date End Date Taking? Authorizing Provider  acetaminophen (TYLENOL) 500 MG tablet Take 1,000 mg by mouth every 6 (six) hours as needed for mild pain or moderate pain.    [provider]  Amino Acids-Protein Hydrolys (FEEDING SUPPLEMENT, PRO-STAT SUGAR FREE 64,) LIQD Take 30 mLs by mouth 2 (two) times daily.    [provider]  amLODipine (NORVASC) 10 MG tablet Take 1 tablet (10 mg total) by mouth daily. 11/19/19   Charlynne Cousins, MD  aspirin EC 81 MG tablet Take 1 tablet (81 mg total) by mouth daily. 05/24/15   Ghimire, Henreitta Leber, MD  atorvastatin (LIPITOR) 40 MG tablet Take 1 tablet (40 mg total) by mouth daily. 12/05/20   [place], Peter M, MD  atorvastatin (LIPITOR) 40 MG tablet Take 1 tablet (40 mg total) by mouth  daily. 12/05/20 03/05/21  [place], Peter M, MD  brimonidine (ALPHAGAN) 0.2 % ophthalmic solution Place 1 drop into the left eye 2 (two) times daily.    [provider]  calcium acetate (PHOSLO) 667 MG capsule Take 1 capsule (667 mg total) by mouth 3 (three) times daily with meals. 11/19/19   Charlynne Cousins, MD  Darbepoetin Alfa (ARANESP) 60 MCG/0.3ML SOSY injection Inject 0.3 mLs (60 mcg total) into the vein every Tuesday with hemodialysis. 11/23/19   Charlynne Cousins, MD  dorzolamide-timolol (COSOPT) 22.3-6.8 MG/ML ophthalmic solution Place 1 drop into the left eye 2 (two) times daily. 09/23/19   Geradine Girt, DO  ferrous sulfate 325 (65 FE) MG tablet Take by mouth. 08/25/20   [provider]  gabapentin (NEURONTIN) 400 MG capsule Take 400 mg by mouth 3 (three) times daily.    [provider]  glipiZIDE (GLUCOTROL) 10 MG tablet Take 1 tablet (10 mg total) by mouth 2 (two) times daily. 09/23/19   Geradine Girt, DO  hydrALAZINE (APRESOLINE) 25 MG tablet Take 0.5 tablets (12.5 mg total) by mouth 3 (three) times daily. 09/23/19   Geradine Girt, DO  latanoprost (XALATAN) 0.005 % ophthalmic solution Place 1 drop into the left eye at bedtime. 09/23/19   Geradine Girt, DO  lidocaine-prilocaine (EMLA) cream Apply topically. 04/27/20   [provider]  losartan (COZAAR) 100 MG tablet Take 1 tablet (100 mg total) by mouth daily. 11/19/19   Charlynne Cousins, MD  methocarbamol (ROBAXIN) 750 MG tablet Take 750 mg by mouth daily as needed for muscle spasms.    [provider]  Methoxy PEG-Epoetin Beta (MIRCERA IJ) Mircera 07/20/20 07/19/21  [provider]  moxifloxacin (VIGAMOX) 0.5 % ophthalmic solution Place 1 drop into the left eye 2 (two) times daily.  09/24/19   [provider]  potassium chloride SA (KLOR-CON) 20 MEQ tablet Take 20 mEq by mouth daily. 10/08/20   [provider]  pregabalin (  LYRICA) 25 MG capsule Take by mouth. 08/25/20    [provider]  sodium bicarbonate 650 MG tablet Take 1 tablet (650 mg total) by mouth 3 (three) times daily. 09/23/19   Geradine Girt, DO  torsemide (DEMADEX) 100 MG tablet Take 1 tablet (100 mg total) by mouth daily. 11/19/19   Charlynne Cousins, MD     Critical care time: 12 minutes      Erskine Emery MD PCCM

## 2021-03-26 NOTE — Procedures (Signed)
Thoracentesis  Procedure Note  Christopher Davila  [id number]  May 06, 1971  Date:03/26/21  Time:10:10 AM   Provider Performing:Keona Bilyeu C Tamala Julian   Procedure: Thoracentesis with imaging guidance [id number])  Indication(s) Pleural Effusion  Consent Risks of the procedure as well as the alternatives and risks of each were explained to the patient and/or caregiver.  Consent for the procedure was obtained and is signed in the bedside chart  Anesthesia Topical only with 1% lidocaine    Time Out Verified patient identification, verified procedure, site/side was marked, verified correct patient position, special equipment/implants available, medications/allergies/relevant history reviewed, required imaging and test results available.   Sterile Technique Maximal sterile technique including full sterile barrier drape, hand hygiene, sterile gown, sterile gloves, mask, hair covering, sterile ultrasound probe cover (if used).  Procedure Description Ultrasound was used to identify appropriate pleural anatomy for placement and overlying skin marked.  Area of drainage cleaned and draped in sterile fashion. Lidocaine was used to anesthetize the skin and subcutaneous tissue.  1200 cc's of amber appearing fluid was drained from the left pleural space. Catheter then removed and bandaid applied to site.   Complications/Tolerance None; patient tolerated the procedure well. Chest X-ray is ordered to confirm no post-procedural complication.   EBL Minimal   Specimen(s) none

## 2021-03-26 NOTE — ED Triage Notes (Signed)
Pt comes via [facility] for resp distress from [facility]. SOB started tonight, normally wears 3L, stat was only 60's, covid+ two days ago, increased to 5L at facility, pt now on CPAP, pt is a dialysis pt MWF, last treatment on Friday.

## 2021-03-26 NOTE — ED Notes (Signed)
Pt took off bipap, oxygen dropped to 50's, pt placed back on bipap and educated

## 2021-03-27 ENCOUNTER — Inpatient Hospital Stay (HOSPITAL_COMMUNITY): Payer: Medicare Other

## 2021-03-27 DIAGNOSIS — J9601 Acute respiratory failure with hypoxia: Secondary | ICD-10-CM | POA: Diagnosis not present

## 2021-03-27 DIAGNOSIS — Z9889 Other specified postprocedural states: Secondary | ICD-10-CM

## 2021-03-27 DIAGNOSIS — I5032 Chronic diastolic (congestive) heart failure: Secondary | ICD-10-CM

## 2021-03-27 LAB — COMPREHENSIVE METABOLIC PANEL
ALT: 15 U/L (ref 0–44)
AST: 34 U/L (ref 15–41)
Albumin: 3.5 g/dL (ref 3.5–5.0)
Alkaline Phosphatase: 77 U/L (ref 38–126)
Anion gap: 15 (ref 5–15)
BUN: 26 mg/dL — ABNORMAL HIGH (ref 6–20)
CO2: 26 mmol/L (ref 22–32)
Calcium: 8.5 mg/dL — ABNORMAL LOW (ref 8.9–10.3)
Chloride: 93 mmol/L — ABNORMAL LOW (ref 98–111)
Creatinine, Ser: 3.66 mg/dL — ABNORMAL HIGH (ref 0.61–1.24)
GFR, Estimated: 19 mL/min — ABNORMAL LOW (ref >60)
Glucose, Bld: 119 mg/dL — ABNORMAL HIGH (ref 70–99)
Potassium: 4.3 mmol/L (ref 3.5–5.1)
Sodium: 134 mmol/L — ABNORMAL LOW (ref 135–145)
Total Bilirubin: 0.6 mg/dL (ref 0.3–1.2)
Total Protein: 8.5 g/dL — ABNORMAL HIGH (ref 6.5–8.1)

## 2021-03-27 LAB — CBC WITH DIFFERENTIAL/PLATELET
Abs Immature Granulocytes: 0.07 10*3/uL (ref 0.00–0.07)
Basophils Absolute: 0 10*3/uL (ref 0.0–0.1)
Basophils Relative: 0 %
Eosinophils Absolute: 0 10*3/uL (ref 0.0–0.5)
Eosinophils Relative: 0 %
HCT: 38.4 % — ABNORMAL LOW (ref 39.0–52.0)
Hemoglobin: 12.2 g/dL — ABNORMAL LOW (ref 13.0–17.0)
Immature Granulocytes: 1 %
Lymphocytes Relative: 5 %
Lymphs Abs: 0.6 10*3/uL — ABNORMAL LOW (ref 0.7–4.0)
MCH: 27.8 pg (ref 26.0–34.0)
MCHC: 31.8 g/dL (ref 30.0–36.0)
MCV: 87.5 fL (ref 80.0–100.0)
Monocytes Absolute: 0.4 10*3/uL (ref 0.1–1.0)
Monocytes Relative: 4 %
Neutro Abs: 9.5 10*3/uL — ABNORMAL HIGH (ref 1.7–7.7)
Neutrophils Relative %: 90 %
Platelets: 145 10*3/uL — ABNORMAL LOW (ref 150–400)
RBC: 4.39 MIL/uL (ref 4.22–5.81)
RDW: 14.7 % (ref 11.5–15.5)
WBC: 10.5 10*3/uL (ref 4.0–10.5)
nRBC: 0 % (ref 0.0–0.2)

## 2021-03-27 LAB — HEPATITIS B SURFACE ANTIGEN: Hepatitis B Surface Ag: NONREACTIVE

## 2021-03-27 LAB — D-DIMER, QUANTITATIVE: D-Dimer, Quant: 5.9 ug/mL-FEU — ABNORMAL HIGH (ref 0.00–0.50)

## 2021-03-27 LAB — ECHOCARDIOGRAM COMPLETE
Area-P 1/2: 4.04 cm2
S' Lateral: 3.6 cm
Weight: 1894.19 oz

## 2021-03-27 LAB — GLUCOSE, CAPILLARY
Glucose-Capillary: 107 mg/dL — ABNORMAL HIGH (ref 70–99)
Glucose-Capillary: 113 mg/dL — ABNORMAL HIGH (ref 70–99)
Glucose-Capillary: 161 mg/dL — ABNORMAL HIGH (ref 70–99)
Glucose-Capillary: 171 mg/dL — ABNORMAL HIGH (ref 70–99)
Glucose-Capillary: 231 mg/dL — ABNORMAL HIGH (ref 70–99)
Glucose-Capillary: 91 mg/dL (ref 70–99)

## 2021-03-27 LAB — FERRITIN: Ferritin: 3170 ng/mL — ABNORMAL HIGH (ref 24–336)

## 2021-03-27 LAB — HEPATITIS B SURFACE ANTIBODY,QUALITATIVE: Hep B S Ab: REACTIVE — AB

## 2021-03-27 LAB — PHOSPHORUS: Phosphorus: 4.4 mg/dL (ref 2.5–4.6)

## 2021-03-27 LAB — C-REACTIVE PROTEIN: CRP: 11.5 mg/dL — ABNORMAL HIGH (ref <1.0)

## 2021-03-27 LAB — MAGNESIUM: Magnesium: 2.1 mg/dL (ref 1.7–2.4)

## 2021-03-27 MED ORDER — LATANOPROST 0.005 % OP SOLN
1.0000 [drp] | Freq: Every day | OPHTHALMIC | Status: DC
  Administered 2021-03-27 – 2021-04-09 (×14): 1 [drp] via OPHTHALMIC
  Filled 2021-03-27: qty 2.5

## 2021-03-27 MED ORDER — SERTRALINE HCL 25 MG PO TABS
25.0000 mg | ORAL_TABLET | Freq: Every day | ORAL | Status: DC
  Administered 2021-03-27 – 2021-04-10 (×15): 25 mg via ORAL
  Filled 2021-03-27 (×15): qty 1

## 2021-03-27 MED ORDER — METHYLPREDNISOLONE SODIUM SUCC 125 MG IJ SOLR
60.0000 mg | INTRAMUSCULAR | Status: AC
  Administered 2021-03-27 – 2021-03-28 (×2): 60 mg via INTRAVENOUS
  Filled 2021-03-27 (×2): qty 2

## 2021-03-27 MED ORDER — ATORVASTATIN CALCIUM 40 MG PO TABS
40.0000 mg | ORAL_TABLET | Freq: Every day | ORAL | Status: DC
  Administered 2021-03-27 – 2021-04-10 (×15): 40 mg via ORAL
  Filled 2021-03-27 (×15): qty 1

## 2021-03-27 MED ORDER — DORZOLAMIDE HCL-TIMOLOL MAL 2-0.5 % OP SOLN
1.0000 [drp] | Freq: Two times a day (BID) | OPHTHALMIC | Status: DC
  Administered 2021-03-27 – 2021-04-10 (×29): 1 [drp] via OPHTHALMIC
  Filled 2021-03-27 (×2): qty 10

## 2021-03-27 MED ORDER — ASPIRIN 81 MG PO CHEW
81.0000 mg | CHEWABLE_TABLET | Freq: Every day | ORAL | Status: DC
  Administered 2021-03-27 – 2021-04-10 (×15): 81 mg via ORAL
  Filled 2021-03-27 (×15): qty 1

## 2021-03-27 MED ORDER — POLYVINYL ALCOHOL 1.4 % OP SOLN
1.0000 [drp] | Freq: Three times a day (TID) | OPHTHALMIC | Status: DC
  Administered 2021-03-27 – 2021-04-10 (×40): 1 [drp] via OPHTHALMIC
  Filled 2021-03-27 (×2): qty 15

## 2021-03-27 MED ORDER — HYDRALAZINE HCL 25 MG PO TABS
25.0000 mg | ORAL_TABLET | Freq: Three times a day (TID) | ORAL | Status: DC
  Administered 2021-03-27 – 2021-04-09 (×39): 25 mg via ORAL
  Filled 2021-03-27 (×39): qty 1

## 2021-03-27 MED ORDER — PHENOL 1.4 % MT LIQD
1.0000 | OROMUCOSAL | Status: DC | PRN
  Filled 2021-03-27: qty 177

## 2021-03-27 MED ORDER — BRIMONIDINE TARTRATE 0.15 % OP SOLN
1.0000 [drp] | Freq: Two times a day (BID) | OPHTHALMIC | Status: DC
  Administered 2021-03-27 – 2021-04-10 (×29): 1 [drp] via OPHTHALMIC
  Filled 2021-03-27 (×2): qty 5

## 2021-03-27 MED ORDER — AMLODIPINE BESYLATE 10 MG PO TABS
10.0000 mg | ORAL_TABLET | Freq: Every day | ORAL | Status: DC
  Administered 2021-03-27 – 2021-04-10 (×15): 10 mg via ORAL
  Filled 2021-03-27 (×15): qty 1

## 2021-03-27 MED ORDER — SENNA 8.6 MG PO TABS
1.0000 | ORAL_TABLET | Freq: Every day | ORAL | Status: DC
  Administered 2021-03-27 – 2021-04-09 (×6): 8.6 mg via ORAL
  Filled 2021-03-27 (×9): qty 1

## 2021-03-27 MED ORDER — DIPHENHYDRAMINE HCL 25 MG PO CAPS
25.0000 mg | ORAL_CAPSULE | ORAL | Status: DC | PRN
  Administered 2021-03-27 (×2): 25 mg via ORAL
  Filled 2021-03-27 (×2): qty 1

## 2021-03-27 MED ORDER — PREGABALIN 25 MG PO CAPS
25.0000 mg | ORAL_CAPSULE | Freq: Two times a day (BID) | ORAL | Status: DC
  Administered 2021-03-27 – 2021-04-10 (×29): 25 mg via ORAL
  Filled 2021-03-27 (×29): qty 1

## 2021-03-27 MED ORDER — OLOPATADINE HCL 0.1 % OP SOLN
2.0000 [drp] | Freq: Two times a day (BID) | OPHTHALMIC | Status: DC
  Administered 2021-03-27 – 2021-04-10 (×29): 2 [drp] via OPHTHALMIC
  Filled 2021-03-27: qty 5

## 2021-03-27 MED ORDER — WHITE PETROLATUM EX OINT
TOPICAL_OINTMENT | CUTANEOUS | Status: AC
  Filled 2021-03-27: qty 28.35

## 2021-03-27 MED ORDER — PREDNISONE 5 MG PO TABS
50.0000 mg | ORAL_TABLET | Freq: Every day | ORAL | Status: DC
  Administered 2021-03-29 – 2021-04-08 (×11): 50 mg via ORAL
  Filled 2021-03-27 (×9): qty 2
  Filled 2021-03-27: qty 1
  Filled 2021-03-27: qty 2

## 2021-03-27 MED ORDER — POLYETHYLENE GLYCOL 3350 17 G PO PACK
17.0000 g | PACK | Freq: Every day | ORAL | Status: DC
  Administered 2021-03-27 – 2021-04-10 (×7): 17 g via ORAL
  Filled 2021-03-27 (×10): qty 1

## 2021-03-27 NOTE — Progress Notes (Signed)
  Echocardiogram 2D Echocardiogram has been performed.  Merrie Roof F 03/27/2021, 11:57 AM

## 2021-03-27 NOTE — Progress Notes (Addendum)
Contacted pt's out-pt clinic for pt's times of treatment. Will await a response.   Melven Sartorius Renal Navigator [phone]  Addendum at 3:14 pm: Pt receives treatment at [facility] on MWF. Pt arrives at 12:40 with 12:55 chair time. Advised clinic pt is covid positive in case they were not aware.

## 2021-03-27 NOTE — Progress Notes (Signed)
RT at bedside to assess pt this AM. Pt was found on 50L 100% on HHFNC. Pt was decreased to 40L and 80% tolerating well with SVS. RT will continue to monitor pt.

## 2021-03-27 NOTE — Progress Notes (Signed)
[facility] Progress Note    Assessment/ Plan:   ESRD:  Outpatient orders: [facility], MWF, 4 hrs, F180, 2k, 2cal, edw 53kg, no heparin -will resume MWF schedule, next HD tomorrow   # AHRF COVID positive 03/24/2021 Pulmonary edema, left pleural effusion -remdesivir/tocilizumab per primary service, on solumedrol -currently on BIPAP -thora 10/10: 1200cc drained   # Volume/ hypertension: EDW 53kg. UF as tolerated, HD 10/10--down close to EDW. Will attempt more UF tomorrow   # Anemia of Chronic Kidney Disease: Hemoglobin 12.2. Receiving Mircera 75mg qtreatment, last dose 10/3 as an outpatient. Hold for now   # Secondary Hyperparathyroidism/Hyperphosphatemia: resume home binders    # Vascular access: RUE AVF   # Additional recommendations: - Dose all meds for creatinine clearance < 10 ml/min  - Unless absolutely necessary, no MRIs with gadolinium.  - Implement save arm precautions.  Prefer needle sticks in the dorsum of the hands or wrists.  No blood pressure measurements in arm. - If blood transfusion is requested during hemodialysis sessions, please alert uKoreaprior to the session.  - If a hemodialysis catheter line culture is requested, please alert uKoreaas only hemodialysis nurses are able to collect those specimens.    Recommendations were discussed with the primary team.   VGean Quint MD [facility]  Subjective:   Tolerated HD yesterday, net uf 2753cc, post tx weight 53.7kg. on HFNC. No acute events.  Patient not examined today in the context of COVID-19+ (limiting exposure to other providers, PPE use). Chart reviewed, discussed with primary service.   Objective:   BP (!) 173/98   Pulse 82   Temp 98.2 F (36.8 C) (Oral)   Resp 18   Wt 53.7 kg   SpO2 94%   BMI 20.97 kg/m   Intake/Output Summary (Last 24 hours) at 03/27/2021 0813 Last data filed at 03/27/2021 0500 Gross per 24 hour  Intake 289.33 ml  Output -2378 ml  Net 2667.33 ml    Weight change:   Physical Exam: Not examined today in the context of patient being COVID positive. Chart reviewed.  Imaging: Portable chest 1 View  Result Date: 03/27/2021 CLINICAL DATA:  Shortness of breath EXAM: PORTABLE CHEST 1 VIEW COMPARISON:  Chest radiograph dated 1 day prior FINDINGS: The heart is enlarged, unchanged. The mediastinal contours are stable. There are left larger than right pleural effusions, not significantly changed in size. Aeration of the left base has slightly worsened in the interim. Patchy interstitial opacities throughout both lungs are otherwise similar to the prior study. There is no pneumothorax. The bones are stable. IMPRESSION: No significant interval change in size of bilateral pleural effusions with worsened aeration of the left base. Otherwise overall unchanged patchy interstitial opacities throughout both lungs favoring pulmonary interstitial edema. Electronically Signed   By: PValetta MoleM.D.   On: 03/27/2021 07:55   DG CHEST PORT 1 VIEW  Result Date: 03/26/2021 CLINICAL DATA:  Increasing shortness of breath and hypoxia. History of end-stage renal disease on hemodialysis. Diastat heart failure EXAM: PORTABLE CHEST 1 VIEW COMPARISON:  Radiographs 03/26/2021 and 03/16/2021.  CT 07/27/2020. FINDINGS: 1143 hours. The heart size and mediastinal contours are stable. There are bilateral pleural effusions, with interval enlargement on the right and slight improvement on the left. Diffuse bilateral perihilar airspace opacities are again noted, similar to multiple prior studies and likely reflecting chronic/recurrent edema. No pneumothorax. The bones appear unchanged. Multiple telemetry leads overlie the chest. IMPRESSION: Fluctuating bilateral pleural effusions, with interval improvement on  the left and worsening on the right. Chronic bilateral airspace opacities, similar to previous study, likely recurrent edema. Electronically Signed   By: Richardean Sale M.D.   On:  03/26/2021 12:29   DG Chest Portable 1 View  Result Date: 03/26/2021 CLINICAL DATA:  50 year old male dialysis patient with shortness of breath, respiratory distress. Positive COVID-19 2 days ago. EXAM: PORTABLE CHEST 1 VIEW COMPARISON:  Portable chest 03/16/2021 and earlier. FINDINGS: Chronic pleural effusions, currently moderate on the left, small on the right, and stable from last month. Associated lower lobe collapse or consolidation with increased air bronchograms from last month. Indistinct pulmonary vasculature bilaterally but new more confluent right greater than left perihilar opacity. No pneumothorax. Stable cardiac size and mediastinal contours. Visualized tracheal air column is within normal limits. No acute osseous abnormality identified. Chronic right axillary surgical clips. Paucity of bowel gas in the upper abdomen. IMPRESSION: Frequent pulmonary edema and chronic bilateral pleural effusions in this patient - not significantly changed from last month - but new more confluent right greater than left perihilar opacity is suspicious for superimposed Acute Pneumonia in this setting. And bilateral lower lobe consolidation may also be progressed. Electronically Signed   By: Genevie Ann M.D.   On: 03/26/2021 06:16    Labs: BMET Recent Labs  Lab 03/26/21 0553 03/26/21 0609 03/27/21 0645  NA 136 135 134*  K 5.2* 5.2* 4.3  CL 95* 101 93*  CO2 24  --  26  GLUCOSE 122* 120* 119*  BUN 45* 43* 26*  CREATININE 6.10* 6.10* 3.66*  CALCIUM 9.3  --  8.5*  PHOS  --   --  4.4   CBC Recent Labs  Lab 03/26/21 0553 03/26/21 0609 03/27/21 0645  WBC 11.4*  --  10.5  NEUTROABS 9.5*  --  9.5*  HGB 11.7* 13.6 12.2*  HCT 39.0 40.0 38.4*  MCV 92.2  --  87.5  PLT 165  --  145*    Medications:     acetaminophen  1,000 mg Oral Once   calcium acetate  667 mg Oral TID WC   Chlorhexidine Gluconate Cloth  6 each Topical Q0600   docusate sodium  100 mg Oral BID   heparin injection (subcutaneous)   5,000 Units Subcutaneous Q8H   hydrALAZINE  25 mg Oral Q8H   insulin aspart  0-6 Units Subcutaneous Q4H   methylPREDNISolone (SOLU-MEDROL) injection  0.5 mg/kg Intravenous Q12H   Followed by   Derrill Memo ON 03/29/2021] predniSONE  50 mg Oral Daily      Gean Quint, MD [facility] 03/27/2021, 8:13 AM

## 2021-03-27 NOTE — Progress Notes (Signed)
NAME:  Christopher Davila, MRN:  [id number], DOB:  [birth year], LOS: 1 ADMISSION DATE:  03/26/2021, CONSULTATION DATE:  03/26/21 REFERRING MD:  Dr. Maryan Rued, CHIEF COMPLAINT:  [facility]   Brief History   50 y/o M who presented to [facility] on 10/10 with reports of shortness of breath.    The patient lives in a facility and is baseline 3L O2 dependent.  He is HD dependent M/W/F.  The facility [facility]) called EMS for shortness of breath. He normally wears 3L oxygen, oxygen sats were in the 60's and was increased to 5L at the facility without improvement. EMS was called and confirmed hypoxia.  The patient was COVID positive 2 days prior to admit (10/8) at the facility.  On arrival to the ER, he failed salter O2 and was placed on CPAP without improvement.  He ultimately was placed on BiPAP with improvement in saturations and work of breathing.  His last HD session was on Friday 10/7.  EKG showed ST.  He was hypertensive and exam suggestive of volume overload.  CXR showed chronic left pleural effusion.  The patient pulled his biPAP off and desaturated into the 50's.  He was treated empirically with rocephin.  COVID status was confirmed positive, flu negative. Initial labs showed K 5.2, glucose 120, BUN 43, sr cr 6.10, AG 17, troponin 29, WBC 11.4, Hgb 11.7 and platelets 165.     PCCM called for ICU admission.   Past Medical History  Anemia HFpEF DM ESRD (MWF) Gastroparesis HTN Supplemental O2 Requirement (3L [place])  Significant Hospital Events   10/10 - Admit with [facility], COVID+ on BIPAP 10/11 - On [facility]NC at 50L  Consults:  none  Procedures:  10/10 - Thoracentesis   Significant Diagnostic Tests:   DG CHEST PORT 1 VIEW Result Date: 03/26/2021 FINDINGS: 1143 hours. The heart size and mediastinal contours are stable. There are bilateral pleural effusions, with interval enlargement on the right and slight improvement on the left. Diffuse bilateral perihilar airspace opacities are again noted, similar to  multiple prior studies and likely reflecting chronic/recurrent edema. No pneumothorax. The bones appear unchanged. Multiple telemetry leads overlie the chest.  IMPRESSION: Fluctuating bilateral pleural effusions, with interval improvement on the left and worsening on the right. Chronic bilateral airspace opacities, similar to previous study, likely recurrent edema.   DG Chest Portable 1 View Result Date: 03/26/2021 FINDINGS: Chronic pleural effusions, currently moderate on the left, small on the right, and stable from last month. Associated lower lobe collapse or consolidation with increased air bronchograms from last month. Indistinct pulmonary vasculature bilaterally but new more confluent right greater than left perihilar opacity. No pneumothorax. Stable cardiac size and mediastinal contours. Visualized tracheal air column is within normal limits. No acute osseous abnormality identified. Chronic right axillary surgical clips. Paucity of bowel gas in the upper abdomen.  IMPRESSION: Frequent pulmonary edema and chronic bilateral pleural effusions in this patient - not significantly changed from last month - but new more confluent right greater than left perihilar opacity is suspicious for superimposed Acute Pneumonia in this setting. And bilateral lower lobe consolidation may also be progressed.    Micro Data:  10/10 - MRSA PCR positive 10/10 COVID PCR positive   Antimicrobials:  10/10 Ceftraixone-10/10 10/10 Remdesivir>   Interim History/Subjective:  Overnight events: had HD overnight with 2 L off. Still having elevated BP requiring prn metoprolol and hydralazine.   Patient resting in bed on heated high flow nasal cannula at 50 L.  He states that he  feels better than when he was first admitted but does not feel at his baseline.  States that he had hemodialysis this morning but was concerned because they were trying to pull more fluid than he is used to.  Otherwise he denies any significant  symptoms at this time.  Objective:  Blood pressure (!) (P) 174/95, pulse 82, temperature 98.1 F (36.7 C), temperature source Oral, resp. rate (P) 18, weight 55.8 kg, SpO2 (P) 94 %.    Vent Mode: PCV;BIPAP FiO2 (%):  [80 %-100 %] 80 % Set Rate:  [8 bmp] 8 bmp PEEP:  [8 cmH20] 8 cmH20   Intake/Output Summary (Last 24 hours) at 03/27/2021 0538 Last data filed at 03/27/2021 0021 Gross per 24 hour  Intake 289.33 ml  Output 375 ml  Net -85.67 ml   Filed Weights   03/27/21 0051  Weight: 55.8 kg    Examination: General: Chronically ill-appearing thin male HEENT: Poor dentition, elevated JVD Lungs: Heated high flow nasal cannula at 50 L,  Cardiovascular: RRR no murmors, rubs or gallops Abdomen: Soft, non nondistended distended  Extremities: Warm, dry, 1+ pitting edema lower extremities Neuro: No focal neurologic deficits appears to be at baseline GU: 375 cc urine output overnight.  Resolved Hospital Problem list   Chronic Left Pleural Effusion s/p thoracentesis   Assessment & Plan:  Christopher Davila is a 50 y.o. with a pertinent PMH of anemia, HFpEF, DM, ESRD (MFR), gastroparesis, HTN, sup O2 requirement of 3L baseline, who presented with [facility] and admitted for acute hypoxic respiratory failure.    Multifactorial Acute Hypoxic Respiratory Failure COVID-19 positive  Patient presented with shortness of breath with CXR showing pulmonary edema and chronic bilateral pleural effusions.  Had thoracentesis yesterday and HD overnight with roughly 2.7 L out from HD.  He is back at his dry weight of roughly 53 kg.  Profile minimally elevated at 0.86 with a normal white blood cell count of 10.5.  Shortness of breath likely multifactorial in the setting of COVID-19, hypervolemia, and acute on chronic pleural effusions. -Supplemental oxygen as tolerated currently on heated high flow at 50 L - Remdesivir, steroids, actemra - HD overnight with 2.7 liters out, next session tomorrow - no obvious need  for AB  Chronic Diastolic CHF: ESRD: Patient likely has a component of hypervolemia secondary to HFpEF and ESRD.  He recently received hemodialysis overnight with 2.7 L out now at his dry weight of 53 kg.  Plan for repeat hemodialysis tomorrow. -Strict ins and outs, daily weights -Measure urine output and kidney function daily  Hypertension: HLD: Patient has a history of hypertension on amlodipine 10 mg daily, hydralazine 50 mg 3 times daily, losartan 100 mg daily, and torsemide 20 mg twice daily.  Additionally he is on atorvastatin 40 mg daily for hyperlipidemia.  Blood pressures remain elevated overnight with as needed metoprolol and hydralazine given. -We will consider restarting home blood pressure medications as tolerated.  DM -SSI  Anemia -Chronic stable  Best Practice:  Diet: NPO (bedside swallow eval) Pain/Anxiety/Delirium protocol (if indicated): fentanyl  VAP protocol (if indicated): n/a DVT prophylaxis: prophylactic heparin  GI prophylaxis: N/A Glucose control: SSI Lines: Lines: N/A Foley: Foley:  N/A Mobility: bed rest Code Status: full code Family Communication: pending Disposition: ICU  Labs   CBC: Recent Labs  Lab 03/26/21 0553 03/26/21 0609  WBC 11.4*  --   NEUTROABS 9.5*  --   HGB 11.7* 13.6  HCT 39.0 40.0  MCV 92.2  --   PLT  165  --     Basic Metabolic Panel: Recent Labs  Lab 03/26/21 0553 03/26/21 0609  NA 136 135  K 5.2* 5.2*  CL 95* 101  CO2 24  --   GLUCOSE 122* 120*  BUN 45* 43*  CREATININE 6.10* 6.10*  CALCIUM 9.3  --    GFR: Estimated Creatinine Clearance: 11.6 mL/min (A) (by C-G formula based on SCr of 6.1 mg/dL (H)). Recent Labs  Lab 03/26/21 0553 03/26/21 0906  PROCALCITON  --  0.86  WBC 11.4*  --     Liver Function Tests: Recent Labs  Lab 03/26/21 0553  AST 28  ALT 16  ALKPHOS 88  BILITOT 1.2  PROT 8.9*  ALBUMIN 3.9   No results for input(s): LIPASE, AMYLASE in the last 168 hours. No results for input(s):  AMMONIA in the last 168 hours.  ABG    Component Value Date/Time   PHART 7.235 (L) 10/25/2019 1210   PCO2ART 55.1 (H) 10/25/2019 1210   PO2ART 127 (H) 10/25/2019 1210   HCO3 22.5 10/25/2019 1210   TCO2 27 03/26/2021 0609   ACIDBASEDEF 3.9 (H) 10/25/2019 1210   O2SAT 98.9 10/25/2019 1210     Coagulation Profile: No results for input(s): INR, PROTIME in the last 168 hours.  Cardiac Enzymes: No results for input(s): CKTOTAL, CKMB, CKMBINDEX, TROPONINI in the last 168 hours.  HbA1C: Hgb A1c MFr Bld  Date/Time Value Ref Range Status  03/26/2021 05:53 AM 4.6 (L) 4.8 - 5.6 % Final    Comment:    (NOTE) Pre diabetes:          5.7%-6.4%  Diabetes:              >6.4%  Glycemic control for   <7.0% adults with diabetes   09/19/2019 11:31 AM 6.4 (H) 4.8 - 5.6 % Final    Comment:    (NOTE) Pre diabetes:          5.7%-6.4% Diabetes:              >6.4% Glycemic control for   <7.0% adults with diabetes     CBG: Recent Labs  Lab 03/26/21 1302 03/26/21 1631 03/26/21 1955 03/26/21 2337 03/27/21 0309  GLUCAP 98 130* 210* 198* 107*    Review of Systems:   See above  Past Medical History  He,  has a past medical history of Anemia, Blindness (09/04/2019), CHF (congestive heart failure) ([place]), Diabetes mellitus, Diarrhea (10/14/2019), Epigastric abdominal pain, ESRD (end stage renal disease) ([place]), Gastroparesis due to DM ([place]), Hypertension, Hypoalbuminemia (04/17/2019), Hypoxia, Neuropathy of lower extremity, Oral candidiasis (06/26/2012), Pancreatitis (06/26/2012), and Weight loss (06/26/2012).   Surgical History    Past Surgical History:  Procedure Laterality Date   AV FISTULA PLACEMENT Right 10/29/2019   Procedure: RIGHT ARM BRACHIOBASILIC ARTERIOVENOUS (AV) FISTULA CREATION;  Surgeon: Rosetta Posner, MD;  Location: [place];  Service: Vascular;  Laterality: Right;   [place] Right 01/05/2020   Procedure: RIGHT ARM SECOND STAGE [place];   Surgeon: Rosetta Posner, MD;  Location: [place];  Service: Vascular;  Laterality: Right;   EYE SURGERY     IR FLUORO GUIDE CV LINE RIGHT  10/28/2019   IR US GUIDE VASC ACCESS RIGHT  10/28/2019     Social History   reports that he quit smoking about 6 years ago. His smoking use included cigarettes. He has never used smokeless tobacco. He reports that he does not currently use alcohol. He reports that he does  not use drugs.   Family History   His family history includes CAD in his cousin and maternal aunt; Diabetes in his maternal aunt and mother; Hypertension in his mother; Lung disease in his mother.   Allergies Allergies  Allergen Reactions   Morphine And Related Itching and Other (See Comments)    Pt prefers not to be given this drug     Home Medications  Prior to Admission medications   Medication Sig Start Date End Date Taking? Authorizing Provider  albuterol (VENTOLIN HFA) 108 (90 Base) MCG/ACT inhaler Inhale 2 puffs into the lungs every 4 (four) hours as needed for shortness of breath.   Yes [provider]  Amino Acids-Protein Hydrolys (FEEDING SUPPLEMENT, PRO-STAT SUGAR FREE 64,) LIQD Take 30 mLs by mouth 2 (two) times daily.   Yes [provider]  amLODipine (NORVASC) 10 MG tablet Take 1 tablet (10 mg total) by mouth daily. 11/19/19  Yes Charlynne Cousins, MD  aspirin EC 81 MG tablet Take 1 tablet (81 mg total) by mouth daily. 05/24/15  Yes Ghimire, Henreitta Leber, MD  atorvastatin (LIPITOR) 40 MG tablet Take 1 tablet (40 mg total) by mouth daily. 12/05/20  Yes [place], Peter M, MD  brimonidine (ALPHAGAN P) 0.1 % SOLN Place 1 drop into the left eye in the morning and at bedtime.   Yes [provider]  calcium acetate (PHOSLO) 667 MG capsule Take 1 capsule (667 mg total) by mouth 3 (three) times daily with meals. 11/19/19  Yes Charlynne Cousins, MD  Dextran 70-Hypromellose (TEARS PURE) 0.1-0.3 % SOLN Place 1 drop into both eyes in the morning, at noon, in the  evening, and at bedtime.   Yes [provider]  diphenhydrAMINE (BENADRYL) 25 MG tablet Take 25 mg by mouth every 8 (eight) hours as needed for itching.   Yes [provider]  dorzolamide-timolol (COSOPT) 22.3-6.8 MG/ML ophthalmic solution Place 1 drop into the left eye 2 (two) times daily. 09/23/19  Yes Vann, Jessica U, DO  ferrous sulfate 325 (65 FE) MG tablet Take 325 mg by mouth daily. 08/25/20  Yes [provider]  guaiFENesin (ROBITUSSIN) 100 MG/5ML liquid Take 100 mg by mouth every 4 (four) hours as needed for cough.   Yes [provider]  hydrALAZINE (APRESOLINE) 50 MG tablet Take 50 mg by mouth 3 (three) times daily.   Yes [provider]  ibuprofen (ADVIL) 800 MG tablet Take 800 mg by mouth every 12 (twelve) hours as needed for mild pain.   Yes [provider]  latanoprost (XALATAN) 0.005 % ophthalmic solution Place 1 drop into the left eye at bedtime. 09/23/19  Yes Vann, Jessica U, DO  losartan (COZAAR) 100 MG tablet Take 1 tablet (100 mg total) by mouth daily. 11/19/19  Yes Charlynne Cousins, MD  Melatonin 10 MG CAPS Take 10 mg by mouth at bedtime.   Yes [provider]  Menthol, Topical Analgesic, 5 % GEL Apply 1 application topically every 12 (twelve) hours as needed (bilateral shoulder pain).   Yes [provider]  olopatadine (PATANOL) 0.1 % ophthalmic solution Place 2 drops into both eyes daily.   Yes [provider]  ondansetron (ZOFRAN) 4 MG tablet Take 4 mg by mouth every 6 (six) hours as needed for nausea or vomiting.   Yes [provider]  oxyCODONE-acetaminophen (PERCOCET/ROXICET) 5-325 MG tablet Take 1 tablet by mouth See admin instructions. 1 tablet once daily every Tuesday, Thursday, Saturday, and Sunday. May take an  additional 1 tablet every 6 hours as needed on Monday's Wednesday's, and Friday's.   Yes [provider]  OXYGEN Inhale 3 L into the lungs continuous.   Yes [provider]  polyethylene glycol (MIRALAX / GLYCOLAX) 17 g packet Take 17 g by mouth daily.   Yes [provider]  potassium chloride SA (KLOR-CON) 20 MEQ tablet Take 20 mEq by mouth daily. 10/08/20  Yes [provider]  pregabalin (LYRICA) 25 MG capsule Take 25 mg by mouth 2 (two) times daily. 08/25/20  Yes [provider]  senna (SENOKOT) 8.6 MG TABS tablet Take 2 tablets by mouth at bedtime.   Yes [provider]  sertraline (ZOLOFT) 25 MG tablet Take 25 mg by mouth daily. On Monday's, Wednesday's, and Friday's, take after dialysis. All other days take in the morning.   Yes [provider]  torsemide (DEMADEX) 20 MG tablet Take 80 mg by mouth 2 (two) times daily.   Yes [provider]  Vitamin D, Ergocalciferol, (DRISDOL) 1.25 MG (50000 UNIT) CAPS capsule Take 50,000 Units by mouth every 7 (seven) days. Sunday's   Yes [provider]     Critical care time: [place], D.O.  Internal Medicine Resident, PGY-3 [facility]  Pager: [phone] 5:38 AM, 03/27/2021

## 2021-03-28 DIAGNOSIS — Z9889 Other specified postprocedural states: Secondary | ICD-10-CM | POA: Diagnosis not present

## 2021-03-28 DIAGNOSIS — J9601 Acute respiratory failure with hypoxia: Secondary | ICD-10-CM | POA: Diagnosis not present

## 2021-03-28 LAB — COMPREHENSIVE METABOLIC PANEL
ALT: 12 U/L (ref 0–44)
AST: 30 U/L (ref 15–41)
Albumin: 3.1 g/dL — ABNORMAL LOW (ref 3.5–5.0)
Alkaline Phosphatase: 78 U/L (ref 38–126)
Anion gap: 13 (ref 5–15)
BUN: 54 mg/dL — ABNORMAL HIGH (ref 6–20)
CO2: 25 mmol/L (ref 22–32)
Calcium: 8.6 mg/dL — ABNORMAL LOW (ref 8.9–10.3)
Chloride: 91 mmol/L — ABNORMAL LOW (ref 98–111)
Creatinine, Ser: 5.02 mg/dL — ABNORMAL HIGH (ref 0.61–1.24)
GFR, Estimated: 13 mL/min — ABNORMAL LOW (ref >60)
Glucose, Bld: 93 mg/dL (ref 70–99)
Potassium: 4.7 mmol/L (ref 3.5–5.1)
Sodium: 129 mmol/L — ABNORMAL LOW (ref 135–145)
Total Bilirubin: 1 mg/dL (ref 0.3–1.2)
Total Protein: 7.3 g/dL (ref 6.5–8.1)

## 2021-03-28 LAB — C-REACTIVE PROTEIN: CRP: 6.7 mg/dL — ABNORMAL HIGH (ref <1.0)

## 2021-03-28 LAB — GLUCOSE, CAPILLARY
Glucose-Capillary: 115 mg/dL — ABNORMAL HIGH (ref 70–99)
Glucose-Capillary: 138 mg/dL — ABNORMAL HIGH (ref 70–99)
Glucose-Capillary: 160 mg/dL — ABNORMAL HIGH (ref 70–99)
Glucose-Capillary: 169 mg/dL — ABNORMAL HIGH (ref 70–99)
Glucose-Capillary: 87 mg/dL (ref 70–99)
Glucose-Capillary: 96 mg/dL (ref 70–99)

## 2021-03-28 LAB — MAGNESIUM: Magnesium: 2.4 mg/dL (ref 1.7–2.4)

## 2021-03-28 LAB — CBC WITH DIFFERENTIAL/PLATELET
Abs Immature Granulocytes: 0.11 10*3/uL — ABNORMAL HIGH (ref 0.00–0.07)
Basophils Absolute: 0 10*3/uL (ref 0.0–0.1)
Basophils Relative: 0 %
Eosinophils Absolute: 0 10*3/uL (ref 0.0–0.5)
Eosinophils Relative: 0 %
HCT: 35.4 % — ABNORMAL LOW (ref 39.0–52.0)
Hemoglobin: 11.4 g/dL — ABNORMAL LOW (ref 13.0–17.0)
Immature Granulocytes: 1 %
Lymphocytes Relative: 6 %
Lymphs Abs: 1 10*3/uL (ref 0.7–4.0)
MCH: 27.7 pg (ref 26.0–34.0)
MCHC: 32.2 g/dL (ref 30.0–36.0)
MCV: 86.1 fL (ref 80.0–100.0)
Monocytes Absolute: 1 10*3/uL (ref 0.1–1.0)
Monocytes Relative: 6 %
Neutro Abs: 14.1 10*3/uL — ABNORMAL HIGH (ref 1.7–7.7)
Neutrophils Relative %: 87 %
Platelets: 163 10*3/uL (ref 150–400)
RBC: 4.11 MIL/uL — ABNORMAL LOW (ref 4.22–5.81)
RDW: 14.9 % (ref 11.5–15.5)
WBC: 16.2 10*3/uL — ABNORMAL HIGH (ref 4.0–10.5)
nRBC: 0 % (ref 0.0–0.2)

## 2021-03-28 LAB — D-DIMER, QUANTITATIVE: D-Dimer, Quant: 5.28 ug/mL-FEU — ABNORMAL HIGH (ref 0.00–0.50)

## 2021-03-28 LAB — PHOSPHORUS: Phosphorus: 5.8 mg/dL — ABNORMAL HIGH (ref 2.5–4.6)

## 2021-03-28 LAB — FERRITIN: Ferritin: 3962 ng/mL — ABNORMAL HIGH (ref 24–336)

## 2021-03-28 MED ORDER — MUPIROCIN 2 % EX OINT
TOPICAL_OINTMENT | Freq: Two times a day (BID) | CUTANEOUS | Status: DC
  Filled 2021-03-28 (×3): qty 22

## 2021-03-28 MED ORDER — OXYCODONE-ACETAMINOPHEN 5-325 MG PO TABS
1.0000 | ORAL_TABLET | Freq: Four times a day (QID) | ORAL | Status: DC | PRN
Start: 2021-03-28 — End: 2021-04-10
  Administered 2021-03-28 – 2021-04-10 (×46): 1 via ORAL
  Filled 2021-03-28 (×46): qty 1

## 2021-03-28 MED ORDER — HYDROMORPHONE HCL 2 MG PO TABS
1.0000 mg | ORAL_TABLET | ORAL | Status: DC | PRN
  Administered 2021-03-28: 1 mg via ORAL
  Filled 2021-03-28: qty 1

## 2021-03-28 NOTE — Progress Notes (Signed)
[facility] Progress Note    Assessment/ Plan:   ESRD:  Outpatient orders: [facility], MWF, 4 hrs, F180, 2k, 2cal, edw 53kg, no heparin -will resume MWF schedule, next HD today, UF goal 4L (uf profile, lowest temp, albumin prn to ensure we get close to UF goal)   # AHRF COVID positive 03/24/2021 Pulmonary edema, left pleural effusion -remdesivir per primary service, on solumedrol -currently on BIPAP -thora 10/10: 1200cc drained  # Hyponatremia -likely hypervolemic, managing with HD: 137 Na bath, UF as tolerated   # Volume/ hypertension: EDW 53kg. UF as tolerated, HD 10/10--down close to EDW. Will attempt more UF today   # Anemia of Chronic Kidney Disease: Hemoglobin 11.4 Receiving Mircera 43mg qtreatment, last dose 10/3 as an outpatient. Hold for now   # Secondary Hyperparathyroidism/Hyperphosphatemia: resume home binders    # Vascular access: RUE AVF   # Additional recommendations: - Dose all meds for creatinine clearance < 10 ml/min  - Unless absolutely necessary, no MRIs with gadolinium.  - Implement save arm precautions.  Prefer needle sticks in the dorsum of the hands or wrists.  No blood pressure measurements in arm. - If blood transfusion is requested during hemodialysis sessions, please alert u[place]aprior to the session.  - If a hemodialysis catheter line culture is requested, please alert u[place]as only hemodialysis nurses are able to collect those specimens.    Recommendations were discussed with the primary team.   VGean Quint MD [facility]  Subjective:   No acute events, fio2 down to 50 on hfnc. Elevated pa pressure and enlarged rv size on echo 10/11.   Patient not examined today in the context of COVID-19+ (limiting exposure to other providers, PPE use). Chart reviewed, discussed with primary service.   Objective:   BP (!) 171/108   Pulse 76   Temp 97.9 F (36.6 C) (Oral)   Resp 11   Wt 53.7 kg   SpO2 95%   BMI 20.97 kg/m    Intake/Output Summary (Last 24 hours) at 03/28/2021 [id number] data filed at 03/27/2021 1959 Gross per 24 hour  Intake 515 ml  Output 200 ml  Net 315 ml   Weight change:   Physical Exam: Not examined today in the context of patient being COVID positive. Chart reviewed.  Imaging: Portable chest 1 View  Result Date: 03/27/2021 CLINICAL DATA:  Shortness of breath EXAM: PORTABLE CHEST 1 VIEW COMPARISON:  Chest radiograph dated 1 day prior FINDINGS: The heart is enlarged, unchanged. The mediastinal contours are stable. There are left larger than right pleural effusions, not significantly changed in size. Aeration of the left base has slightly worsened in the interim. Patchy interstitial opacities throughout both lungs are otherwise similar to the prior study. There is no pneumothorax. The bones are stable. IMPRESSION: No significant interval change in size of bilateral pleural effusions with worsened aeration of the left base. Otherwise overall unchanged patchy interstitial opacities throughout both lungs favoring pulmonary interstitial edema. Electronically Signed   By: PValetta MoleM.D.   On: 03/27/2021 07:55   DG CHEST PORT 1 VIEW  Result Date: 03/26/2021 CLINICAL DATA:  Increasing shortness of breath and hypoxia. History of end-stage renal disease on hemodialysis. Diastat heart failure EXAM: PORTABLE CHEST 1 VIEW COMPARISON:  Radiographs 03/26/2021 and 03/16/2021.  CT 07/27/2020. FINDINGS: 1143 hours. The heart size and mediastinal contours are stable. There are bilateral pleural effusions, with interval enlargement on the right and slight improvement on the left. Diffuse bilateral perihilar  airspace opacities are again noted, similar to multiple prior studies and likely reflecting chronic/recurrent edema. No pneumothorax. The bones appear unchanged. Multiple telemetry leads overlie the chest. IMPRESSION: Fluctuating bilateral pleural effusions, with interval improvement on the left and  worsening on the right. Chronic bilateral airspace opacities, similar to previous study, likely recurrent edema. Electronically Signed   By: Richardean Sale M.D.   On: 03/26/2021 12:29   ECHOCARDIOGRAM COMPLETE  Result Date: 03/27/2021    ECHOCARDIOGRAM REPORT   Patient Name:   Christopher Davila Date of Exam: 03/27/2021 Medical Rec #:  [id number]    Height:       63.0 in Accession #:    [id number]   Weight:       118.4 lb Date of Birth:  September 27, 1970    BSA:          1.547 m Patient Age:    50 years     BP:           166/101 mmHg Patient Gender: M            HR:           84 bpm. Exam Location:  Inpatient Procedure: 2D Echo, Cardiac Doppler and Color Doppler Indications:    CHF  History:        Patient has prior history of Echocardiogram examinations, most                 recent 02/13/2019. CHF, Signs/Symptoms:Shortness of Breath; Risk                 Factors:Hypertension, Diabetes and Dyslipidemia. Covid 19                 positive.  Sonographer:    Merrie Roof RDCS Referring Phys: [id number] [place]  1. Left ventricular ejection fraction, by estimation, is 55 to 60%. The left ventricle has normal function. The left ventricle has no regional wall motion abnormalities. Left ventricular diastolic parameters are consistent with Grade II diastolic dysfunction (pseudonormalization).  2. Right ventricular systolic function is normal. The right ventricular size is moderately enlarged. There is moderately elevated pulmonary artery systolic pressure.  3. Left atrial size was mildly dilated.  4. Right atrial size was mildly dilated.  5. Moderate pleural effusion in the left lateral region.  6. The mitral valve is normal in structure. No evidence of mitral valve regurgitation. No evidence of mitral stenosis.  7. The aortic valve is tricuspid. Aortic valve regurgitation is not visualized.  8. The inferior vena cava is normal in size with <50% respiratory variability, suggesting right atrial pressure of 8 mmHg.  Comparison(s): A prior study was performed on 02/13/2019. Prior images reviewed side by side. RV size is larger. Pleural effusion is new. FINDINGS  Left Ventricle: Left ventricular ejection fraction, by estimation, is 55 to 60%. The left ventricle has normal function. The left ventricle has no regional wall motion abnormalities. The left ventricular internal cavity size was normal in size. There is  no left ventricular hypertrophy. Left ventricular diastolic parameters are consistent with Grade II diastolic dysfunction (pseudonormalization). Right Ventricle: The right ventricular size is moderately enlarged. No increase in right ventricular wall thickness. Right ventricular systolic function is normal. There is moderately elevated pulmonary artery systolic pressure. The tricuspid regurgitant  velocity is 3.30 m/s, and with an assumed right atrial pressure of 8 mmHg, the estimated right ventricular systolic pressure is 123XX123 mmHg. Left Atrium: Left atrial size was mildly dilated. Right Atrium:  Right atrial size was mildly dilated. Pericardium: There is no evidence of pericardial effusion. Mitral Valve: The mitral valve is normal in structure. No evidence of mitral valve regurgitation. No evidence of mitral valve stenosis. Tricuspid Valve: The tricuspid valve is normal in structure. Tricuspid valve regurgitation is mild. Aortic Valve: The aortic valve is tricuspid. Aortic valve regurgitation is not visualized. Pulmonic Valve: The pulmonic valve was normal in structure. Pulmonic valve regurgitation is trivial. No evidence of pulmonic stenosis. Aorta: The aortic root is normal in size and structure. Venous: The inferior vena cava is normal in size with less than 50% respiratory variability, suggesting right atrial pressure of 8 mmHg. IAS/Shunts: The atrial septum is grossly normal. Additional Comments: There is a moderate pleural effusion in the left lateral region.  LEFT VENTRICLE PLAX 2D LVIDd:         4.90 cm    Diastology LVIDs:         3.60 cm   LV e' medial:    5.11 cm/s LV PW:         1.10 cm   LV E/e' medial:  22.1 LV IVS:        0.80 cm   LV e' lateral:   8.81 cm/s LVOT diam:     1.90 cm   LV E/e' lateral: 12.8 LV SV:         50 LV SV Index:   32 LVOT Area:     2.84 cm  RIGHT VENTRICLE RV Basal diam:  4.50 cm RV Mid diam:    4.70 cm LEFT ATRIUM             Index        RIGHT ATRIUM           Index LA diam:        3.80 cm 2.46 cm/m   RA Area:     16.90 cm LA Vol (A2C):   61.8 ml 39.94 ml/m  RA Volume:   54.90 ml  35.48 ml/m LA Vol (A4C):   49.6 ml 32.05 ml/m LA Biplane Vol: 57.3 ml 37.03 ml/m  AORTIC VALVE LVOT Vmax:   79.00 cm/s LVOT Vmean:  55.400 cm/s LVOT VTI:    0.177 m  AORTA Ao Root diam: 2.60 cm MITRAL VALVE                TRICUSPID VALVE MV Area (PHT): 4.04 cm     TR Peak grad:   43.6 mmHg MV Decel Time: 188 msec     TR Vmax:        330.00 cm/s MV E velocity: 113.00 cm/s MV A velocity: 84.40 cm/s   SHUNTS MV E/A ratio:  1.34         Systemic VTI:  0.18 m                             Systemic Diam: 1.90 cm Rudean Haskell MD Electronically signed by Rudean Haskell MD Signature Date/Time: 03/27/2021/3:33:18 PM    Final     Labs: BMET Recent Labs  Lab 03/26/21 0553 03/26/21 0609 03/27/21 0645 03/28/21 0514  NA 136 135 134* 129*  K 5.2* 5.2* 4.3 4.7  CL 95* 101 93* 91*  CO2 24  --  26 25  GLUCOSE 122* 120* 119* 93  BUN 45* 43* 26* 54*  CREATININE 6.10* 6.10* 3.66* 5.02*  CALCIUM 9.3  --  8.5* 8.6*  PHOS  --   --  4.4 5.8*   CBC Recent Labs  Lab 03/26/21 0553 03/26/21 0609 03/27/21 0645 03/28/21 0514  WBC 11.4*  --  10.5 16.2*  NEUTROABS 9.5*  --  9.5* 14.1*  HGB 11.7* 13.6 12.2* 11.4*  HCT 39.0 40.0 38.4* 35.4*  MCV 92.2  --  87.5 86.1  PLT 165  --  145* 163    Medications:     acetaminophen  1,000 mg Oral Once   amLODipine  10 mg Oral Daily   aspirin  81 mg Oral Daily   atorvastatin  40 mg Oral Daily   brimonidine  1 drop Left Eye BID   calcium acetate   667 mg Oral TID WC   Chlorhexidine Gluconate Cloth  6 each Topical Q0600   docusate sodium  100 mg Oral BID   dorzolamide-timolol  1 drop Left Eye BID   heparin injection (subcutaneous)  5,000 Units Subcutaneous Q8H   hydrALAZINE  25 mg Oral Q8H   insulin aspart  0-6 Units Subcutaneous Q4H   latanoprost  1 drop Left Eye QHS   methylPREDNISolone (SOLU-MEDROL) injection  60 mg Intravenous Q24H   Followed by   Derrill Memo ON 03/29/2021] predniSONE  50 mg Oral Daily   olopatadine  2 drop Both Eyes BID   polyethylene glycol  17 g Oral Daily   polyvinyl alcohol  1 drop Both Eyes TID   pregabalin  25 mg Oral BID   senna  1 tablet Oral QHS   sertraline  25 mg Oral Daily      Gean Quint, MD [facility] 03/28/2021, 8:39 AM

## 2021-03-28 NOTE — Progress Notes (Signed)
NAME:  Christopher Davila, MRN:  [id number], DOB:  Jul 23, 1970, LOS: 2 ADMISSION DATE:  03/26/2021, CONSULTATION DATE:  03/26/21 REFERRING MD:  Dr. Maryan Rued, CHIEF COMPLAINT:  [facility]   Brief History   50 y/o M who presented to [facility] on 10/10 with reports of shortness of breath.    The patient lives in a facility and is baseline 3L O2 dependent.  He is HD dependent M/W/F.  The facility [facility]) called EMS for shortness of breath. He normally wears 3L oxygen, oxygen sats were in the 60's and was increased to 5L at the facility without improvement. EMS was called and confirmed hypoxia.  The patient was COVID positive 2 days prior to admit (10/8) at the facility.  On arrival to the ER, he failed salter O2 and was placed on CPAP without improvement.  He ultimately was placed on BiPAP with improvement in saturations and work of breathing.  His last HD session was on Friday 10/7.  EKG showed ST.  He was hypertensive and exam suggestive of volume overload.  CXR showed chronic left pleural effusion.  The patient pulled his biPAP off and desaturated into the 50's.  He was treated empirically with rocephin.  COVID status was confirmed positive, flu negative. Initial labs showed K 5.2, glucose 120, BUN 43, sr cr 6.10, AG 17, troponin 29, WBC 11.4, Hgb 11.7 and platelets 165.     PCCM called for ICU admission.   Past Medical History  Anemia HFpEF DM ESRD (MWF) Gastroparesis HTN Supplemental O2 Requirement (3L )  Significant Hospital Events   10/10 - Admit with [facility], COVID+ on BIPAP 10/11 - Had HD with 2.7  removed and now on [place] [facility] 10/12 - decreased O2 to 40L HHFNC  Consults:  none  Procedures:  10/10 - Thoracentesis   Significant Diagnostic Tests:   DG CHEST PORT 1 VIEW Result Date: 03/26/2021 FINDINGS: 1143 hours. The heart size and mediastinal contours are stable. There are bilateral pleural effusions, with interval enlargement on the right and slight improvement on the left. Diffuse  bilateral perihilar airspace opacities are again noted, similar to multiple prior studies and likely reflecting chronic/recurrent edema. No pneumothorax. The bones appear unchanged. Multiple telemetry leads overlie the chest.  IMPRESSION: Fluctuating bilateral pleural effusions, with interval improvement on the left and worsening on the right. Chronic bilateral airspace opacities, similar to previous study, likely recurrent edema.   DG Chest Portable 1 View Result Date: 03/26/2021 FINDINGS: Chronic pleural effusions, currently moderate on the left, small on the right, and stable from last month. Associated lower lobe collapse or consolidation with increased air bronchograms from last month. Indistinct pulmonary vasculature bilaterally but new more confluent right greater than left perihilar opacity. No pneumothorax. Stable cardiac size and mediastinal contours. Visualized tracheal air column is within normal limits. No acute osseous abnormality identified. Chronic right axillary surgical clips. Paucity of bowel gas in the upper abdomen.  IMPRESSION: Frequent pulmonary edema and chronic bilateral pleural effusions in this patient - not significantly changed from last month - but new more confluent right greater than left perihilar opacity is suspicious for superimposed Acute Pneumonia in this setting. And bilateral lower lobe consolidation may also be progressed.    Echocardiogram: Result Date: 03/27/2021 1. Left ventricular ejection fraction, by estimation, is 55 to 60%. The left ventricle has normal function. The left ventricle has no regional wall motion abnormalities. Left ventricular diastolic parameters are consistent with Grade II diastolic dysfunction (pseudonormalization).   2. Right ventricular systolic  function is normal. The right ventricular size is moderately enlarged. There is moderately elevated pulmonary artery systolic pressure.   3. Left atrial size was mildly dilated.   4. Right  atrial size was mildly dilated.   5. Moderate pleural effusion in the left lateral region.   6. The mitral valve is normal in structure. No evidence of mitral valve regurgitation. No evidence of mitral stenosis.   7. The aortic valve is tricuspid. Aortic valve regurgitation is not visualized.   8. The inferior vena cava is normal in size with <50% respiratory variability, suggesting right atrial pressure of 8 mmHg.  Comparison(s): A prior study was performed on 02/13/2019. Prior images reviewed side by side. RV size is larger. Pleural effusion is new.   Micro Data:  10/10 - MRSA PCR positive 10/10 COVID PCR positive   Antimicrobials:  10/10 Ceftraixone-10/10 10/10 Remdesivir>   Interim History/Subjective:  Overnight events: No events.  Patient resting in bed on heated high flow nasal cannula at 40 L with good O2 saturations.  He states that he is feeling okay but hungry.  He denies any significant shortness of breath, chest pain, muscle aches, abdominal pain, dysuria.    Objective:  Blood pressure (!) 171/108, pulse 76, temperature 97.6 F (36.4 C), temperature source Oral, resp. rate 11, weight 53.7 kg, SpO2 95 %.    FiO2 (%):  [80 %-100 %] 100 %   Intake/Output Summary (Last 24 hours) at 03/28/2021 [id number] Last data filed at 03/27/2021 1959 Gross per 24 hour  Intake 515 ml  Output 200 ml  Net 315 ml   Filed Weights   03/27/21 0051 03/27/21 0500  Weight: 55.8 kg 53.7 kg    Examination: General: Chronically ill-appearing thin male HEENT: Poor dentition, elevated JVD Lungs: Heated high flow nasal cannula at 40 L, minimal scattered rales, diminished breath sounds in lung bases Cardiovascular: RRR no murmors, rubs or gallops Abdomen: Soft, non nondistended distended  Extremities: Warm, dry,+1-2 pitting edema lower extremities Neuro: No focal neurologic deficits appears to be at baseline GU: 200 cc urine output overnight.  Resolved Hospital Problem list   Chronic Left  Pleural Effusion s/p thoracentesis   Assessment & Plan:  Christopher Davila is a 50 y.o. with a pertinent PMH of anemia, HFpEF, DM, ESRD (MFR), gastroparesis, HTN, sup O2 requirement of 3L baseline, who presented with [facility] and admitted for acute hypoxic respiratory failure in the setting of ESRD/HFpEF and possible covid pneumonitis.    Acute Hypoxic Respiratory Failure, Multifactorial Hypervolemia 2/2 to ESRD and HFpEF exacerbation  Possible COVID-19 pneumonitis   Patient likely has a component of hypervolemia secondary to HFpEF and ESRD.  Echo shows grade 2 diastolic dysfunction, dilated right ventricle, and elevated pulmonary pressures.  Nephrology plans for repeat hemodialysis today. -Supplemental oxygen as tolerated currently on heated high flow at 40 L - Remdesivir 3/5, Steroids 3/10 - no obvious need for AB -Strict ins and outs -Daily weights -HD today per nephrology -Monitor kidney function electrolytes  Hypertension: HLD: Patient continues to have elevated blood pressures 144/86 on amlodipine 10 mg, hydralazine 25 mg TID. -We will hold off on beta-blockers in setting of acute decompensated heart failure -Continue amlodipine 10 mg hydralazine 25 mg TID -We will get HD today which will hopefully help his blood pressure but consider increasing hydralazine to 50 mg - Cont ASA 81 mg   DM -SSI  Anemia -Chronic stable  Best Practice:  Diet: NPO (bedside swallow eval) Pain/Anxiety/Delirium protocol (if indicated): fentanyl  VAP  protocol (if indicated): n/a DVT prophylaxis: prophylactic heparin  GI prophylaxis: N/A Glucose control: SSI Lines: Lines: N/A Foley: Foley:  N/A Mobility: bed rest Code Status: full code Family Communication: pending Disposition: ICU  Labs   CBC: Recent Labs  Lab 03/26/21 0553 03/26/21 0609 03/27/21 0645 03/28/21 0514  WBC 11.4*  --  10.5 16.2*  NEUTROABS 9.5*  --  9.5* 14.1*  HGB 11.7* 13.6 12.2* 11.4*  HCT 39.0 40.0 38.4* 35.4*  MCV 92.2   --  87.5 86.1  PLT 165  --  145* [id number]    Basic Metabolic Panel: Recent Labs  Lab 03/26/21 0553 03/26/21 0609 03/27/21 0645 03/28/21 0514  NA 136 135 134* 129*  K 5.2* 5.2* 4.3 4.7  CL 95* 101 93* 91*  CO2 24  --  26 25  GLUCOSE 122* 120* 119* 93  BUN 45* 43* 26* 54*  CREATININE 6.10* 6.10* 3.66* 5.02*  CALCIUM 9.3  --  8.5* 8.6*  MG  --   --  2.1 2.4  PHOS  --   --  4.4 5.8*   GFR: Estimated Creatinine Clearance: 13.5 mL/min (A) (by C-G formula based on SCr of 5.02 mg/dL (H)). Recent Labs  Lab 03/26/21 0553 03/26/21 0906 03/27/21 0645 03/28/21 0514  PROCALCITON  --  0.86  --   --   WBC 11.4*  --  10.5 16.2*    Liver Function Tests: Recent Labs  Lab 03/26/21 0553 03/27/21 0645 03/28/21 0514  AST 28 34 30  ALT '16 15 12  '$ ALKPHOS 88 77 78  BILITOT 1.2 0.6 1.0  PROT 8.9* 8.5* 7.3  ALBUMIN 3.9 3.5 3.1*   No results for input(s): LIPASE, AMYLASE in the last 168 hours. No results for input(s): AMMONIA in the last 168 hours.  ABG    Component Value Date/Time   PHART 7.235 (L) 10/25/2019 1210   PCO2ART 55.1 (H) 10/25/2019 1210   PO2ART 127 (H) 10/25/2019 1210   HCO3 22.5 10/25/2019 1210   TCO2 27 03/26/2021 0609   ACIDBASEDEF 3.9 (H) 10/25/2019 1210   O2SAT 98.9 10/25/2019 1210     Coagulation Profile: No results for input(s): INR, PROTIME in the last 168 hours.  Cardiac Enzymes: No results for input(s): CKTOTAL, CKMB, CKMBINDEX, TROPONINI in the last 168 hours.  HbA1C: Hgb A1c MFr Bld  Date/Time Value Ref Range Status  03/26/2021 05:53 AM 4.6 (L) 4.8 - 5.6 % Final    Comment:    (NOTE) Pre diabetes:          5.7%-6.4%  Diabetes:              >6.4%  Glycemic control for   <7.0% adults with diabetes   09/19/2019 11:31 AM 6.4 (H) 4.8 - 5.6 % Final    Comment:    (NOTE) Pre diabetes:          5.7%-6.4% Diabetes:              >6.4% Glycemic control for   <7.0% adults with diabetes     CBG: Recent Labs  Lab 03/27/21 1155 03/27/21 1509  03/27/21 1950 03/27/21 2354 03/28/21 0328  GLUCAP 161* 231* 171* 91 96    Review of Systems:   See above  Past Medical History  He,  has a past medical history of Anemia, Blindness (09/04/2019), CHF (congestive heart failure) ([place]), Diabetes mellitus, Diarrhea (10/14/2019), Epigastric abdominal pain, ESRD (end stage renal disease) ([place]), Gastroparesis due to DM ([place]), Hypertension, Hypoalbuminemia (04/17/2019), Hypoxia, Neuropathy of  lower extremity, Oral candidiasis (06/26/2012), Pancreatitis (06/26/2012), and Weight loss (06/26/2012).   Surgical History    Past Surgical History:  Procedure Laterality Date   AV FISTULA PLACEMENT Right 10/29/2019   Procedure: RIGHT ARM BRACHIOBASILIC ARTERIOVENOUS (AV) FISTULA CREATION;  Surgeon: Rosetta Posner, MD;  Location: [place];  Service: Vascular;  Laterality: Right;   [place] Right 01/05/2020   Procedure: RIGHT ARM SECOND STAGE [place];  Surgeon: Rosetta Posner, MD;  Location: [place];  Service: Vascular;  Laterality: Right;   EYE SURGERY     IR FLUORO GUIDE CV LINE RIGHT  10/28/2019   IR US GUIDE VASC ACCESS RIGHT  10/28/2019     Social History   reports that he quit smoking about 6 years ago. His smoking use included cigarettes. He has never used smokeless tobacco. He reports that he does not currently use alcohol. He reports that he does not use drugs.   Family History   His family history includes CAD in his cousin and maternal aunt; Diabetes in his maternal aunt and mother; Hypertension in his mother; Lung disease in his mother.   Allergies Allergies  Allergen Reactions   Morphine And Related Itching and Other (See Comments)    Pt prefers not to be given this drug     Home Medications  Prior to Admission medications   Medication Sig Start Date End Date Taking? Authorizing Provider  albuterol (VENTOLIN HFA) 108 (90 Base) MCG/ACT inhaler Inhale 2 puffs into the lungs every 4 (four) hours as needed  for shortness of breath.   Yes [provider]  Amino Acids-Protein Hydrolys (FEEDING SUPPLEMENT, PRO-STAT SUGAR FREE 64,) LIQD Take 30 mLs by mouth 2 (two) times daily.   Yes [provider]  amLODipine (NORVASC) 10 MG tablet Take 1 tablet (10 mg total) by mouth daily. 11/19/19  Yes Charlynne Cousins, MD  aspirin EC 81 MG tablet Take 1 tablet (81 mg total) by mouth daily. 05/24/15  Yes Ghimire, Henreitta Leber, MD  atorvastatin (LIPITOR) 40 MG tablet Take 1 tablet (40 mg total) by mouth daily. 12/05/20  Yes [place], Peter M, MD  brimonidine (ALPHAGAN P) 0.1 % SOLN Place 1 drop into the left eye in the morning and at bedtime.   Yes [provider]  calcium acetate (PHOSLO) 667 MG capsule Take 1 capsule (667 mg total) by mouth 3 (three) times daily with meals. 11/19/19  Yes Charlynne Cousins, MD  Dextran 70-Hypromellose (TEARS PURE) 0.1-0.3 % SOLN Place 1 drop into both eyes in the morning, at noon, in the evening, and at bedtime.   Yes [provider]  diphenhydrAMINE (BENADRYL) 25 MG tablet Take 25 mg by mouth every 8 (eight) hours as needed for itching.   Yes [provider]  dorzolamide-timolol (COSOPT) 22.3-6.8 MG/ML ophthalmic solution Place 1 drop into the left eye 2 (two) times daily. 09/23/19  Yes Vann, Jessica U, DO  ferrous sulfate 325 (65 FE) MG tablet Take 325 mg by mouth daily. 08/25/20  Yes [provider]  guaiFENesin (ROBITUSSIN) 100 MG/5ML liquid Take 100 mg by mouth every 4 (four) hours as needed for cough.   Yes [provider]  hydrALAZINE (APRESOLINE) 50 MG tablet Take 50 mg by mouth 3 (three) times daily.   Yes [provider]  ibuprofen (ADVIL) 800 MG tablet Take 800 mg by mouth every 12 (twelve) hours as needed for mild pain.   Yes [provider]  latanoprost (XALATAN) 0.005 %  ophthalmic solution Place 1 drop into the left eye at bedtime. 09/23/19  Yes Vann, Jessica U, DO  losartan (COZAAR) 100 MG tablet Take  1 tablet (100 mg total) by mouth daily. 11/19/19  Yes Charlynne Cousins, MD  Melatonin 10 MG CAPS Take 10 mg by mouth at bedtime.   Yes [provider]  Menthol, Topical Analgesic, 5 % GEL Apply 1 application topically every 12 (twelve) hours as needed (bilateral shoulder pain).   Yes [provider]  olopatadine (PATANOL) 0.1 % ophthalmic solution Place 2 drops into both eyes daily.   Yes [provider]  ondansetron (ZOFRAN) 4 MG tablet Take 4 mg by mouth every 6 (six) hours as needed for nausea or vomiting.   Yes [provider]  oxyCODONE-acetaminophen (PERCOCET/ROXICET) 5-325 MG tablet Take 1 tablet by mouth See admin instructions. 1 tablet once daily every Tuesday, Thursday, Saturday, and Sunday. May take an additional 1 tablet every 6 hours as needed on Monday's Wednesday's, and Friday's.   Yes [provider]  OXYGEN Inhale 3 L into the lungs continuous.   Yes [provider]  polyethylene glycol (MIRALAX / GLYCOLAX) 17 g packet Take 17 g by mouth daily.   Yes [provider]  potassium chloride SA (KLOR-CON) 20 MEQ tablet Take 20 mEq by mouth daily. 10/08/20  Yes [provider]  pregabalin (LYRICA) 25 MG capsule Take 25 mg by mouth 2 (two) times daily. 08/25/20  Yes [provider]  senna (SENOKOT) 8.6 MG TABS tablet Take 2 tablets by mouth at bedtime.   Yes [provider]  sertraline (ZOLOFT) 25 MG tablet Take 25 mg by mouth daily. On Monday's, Wednesday's, and Friday's, take after dialysis. All other days take in the morning.   Yes [provider]  torsemide (DEMADEX) 20 MG tablet Take 80 mg by mouth 2 (two) times daily.   Yes [provider]  Vitamin D, Ergocalciferol, (DRISDOL) 1.25 MG (50000 UNIT) CAPS capsule Take 50,000 Units by mouth every 7 (seven) days. Sunday's   Yes [provider]     Critical care time: [place], D.O.  Internal Medicine  Resident, PGY-3 [facility]  Pager: [phone] 7:18 AM, 03/28/2021

## 2021-03-28 NOTE — Progress Notes (Signed)
eLink Physician-Brief Progress Note Patient Name: Christopher Davila DOB: 1971-02-28 MRN: [id number]   Date of Service  03/28/2021  HPI/Events of Note  PO dilaudid is not effective to control pt's pain Pt states he takes percocet at home   eICU Interventions  Confirmed on home medication list that patient takes Percocet 5-325 mg, ordered to resume. Discontinued Dilaudid to avoid over sedation     Intervention Category Minor Interventions: Routine modifications to care plan (e.g. PRN medications for pain, fever)  Christopher Davila 03/28/2021, 10:52 PM

## 2021-03-29 DIAGNOSIS — J9601 Acute respiratory failure with hypoxia: Secondary | ICD-10-CM | POA: Diagnosis not present

## 2021-03-29 LAB — COMPREHENSIVE METABOLIC PANEL
ALT: 14 U/L (ref 0–44)
AST: 30 U/L (ref 15–41)
Albumin: 2.6 g/dL — ABNORMAL LOW (ref 3.5–5.0)
Alkaline Phosphatase: 66 U/L (ref 38–126)
Anion gap: 11 (ref 5–15)
BUN: 37 mg/dL — ABNORMAL HIGH (ref 6–20)
CO2: 26 mmol/L (ref 22–32)
Calcium: 8.3 mg/dL — ABNORMAL LOW (ref 8.9–10.3)
Chloride: 96 mmol/L — ABNORMAL LOW (ref 98–111)
Creatinine, Ser: 3.79 mg/dL — ABNORMAL HIGH (ref 0.61–1.24)
GFR, Estimated: 19 mL/min — ABNORMAL LOW (ref >60)
Glucose, Bld: 112 mg/dL — ABNORMAL HIGH (ref 70–99)
Potassium: 3.6 mmol/L (ref 3.5–5.1)
Sodium: 133 mmol/L — ABNORMAL LOW (ref 135–145)
Total Bilirubin: 0.6 mg/dL (ref 0.3–1.2)
Total Protein: 6.2 g/dL — ABNORMAL LOW (ref 6.5–8.1)

## 2021-03-29 LAB — CBC WITH DIFFERENTIAL/PLATELET
Abs Immature Granulocytes: 0.05 10*3/uL (ref 0.00–0.07)
Basophils Absolute: 0 10*3/uL (ref 0.0–0.1)
Basophils Relative: 0 %
Eosinophils Absolute: 0 10*3/uL (ref 0.0–0.5)
Eosinophils Relative: 0 %
HCT: 33.3 % — ABNORMAL LOW (ref 39.0–52.0)
Hemoglobin: 11 g/dL — ABNORMAL LOW (ref 13.0–17.0)
Immature Granulocytes: 1 %
Lymphocytes Relative: 11 %
Lymphs Abs: 1.1 10*3/uL (ref 0.7–4.0)
MCH: 28 pg (ref 26.0–34.0)
MCHC: 33 g/dL (ref 30.0–36.0)
MCV: 84.7 fL (ref 80.0–100.0)
Monocytes Absolute: 0.8 10*3/uL (ref 0.1–1.0)
Monocytes Relative: 8 %
Neutro Abs: 8.7 10*3/uL — ABNORMAL HIGH (ref 1.7–7.7)
Neutrophils Relative %: 80 %
Platelets: 131 10*3/uL — ABNORMAL LOW (ref 150–400)
RBC: 3.93 MIL/uL — ABNORMAL LOW (ref 4.22–5.81)
RDW: 15.1 % (ref 11.5–15.5)
WBC: 10.8 10*3/uL — ABNORMAL HIGH (ref 4.0–10.5)
nRBC: 0 % (ref 0.0–0.2)

## 2021-03-29 LAB — PHOSPHORUS: Phosphorus: 3.5 mg/dL (ref 2.5–4.6)

## 2021-03-29 LAB — FERRITIN: Ferritin: 3393 ng/mL — ABNORMAL HIGH (ref 24–336)

## 2021-03-29 LAB — GLUCOSE, CAPILLARY
Glucose-Capillary: 113 mg/dL — ABNORMAL HIGH (ref 70–99)
Glucose-Capillary: 107 mg/dL — ABNORMAL HIGH (ref 70–99)
Glucose-Capillary: 152 mg/dL — ABNORMAL HIGH (ref 70–99)
Glucose-Capillary: 252 mg/dL — ABNORMAL HIGH (ref 70–99)
Glucose-Capillary: 386 mg/dL — ABNORMAL HIGH (ref 70–99)

## 2021-03-29 LAB — MAGNESIUM: Magnesium: 2.2 mg/dL (ref 1.7–2.4)

## 2021-03-29 LAB — D-DIMER, QUANTITATIVE: D-Dimer, Quant: 4.94 ug/mL-FEU — ABNORMAL HIGH (ref 0.00–0.50)

## 2021-03-29 LAB — C-REACTIVE PROTEIN: CRP: 3.1 mg/dL — ABNORMAL HIGH (ref <1.0)

## 2021-03-29 NOTE — Plan of Care (Signed)
  Problem: Education: Goal: Knowledge of General Education information will improve Description: Including pain rating scale, medication(s)/side effects and non-pharmacologic comfort measures 03/29/2021 2342 by Blythe Stanford, RN Outcome: Progressing   Problem: Health Behavior/Discharge Planning: Goal: Ability to manage health-related needs will improve 03/29/2021 2342 by Blythe Stanford, RN Outcome: Progressing   Problem: Clinical Measurements: Goal: Ability to maintain clinical measurements within normal limits will improve 03/29/2021 2342 by Blythe Stanford, RN Outcome: Progressing Goal: Will remain free from infection 03/29/2021 2342 by Blythe Stanford, RN Outcome: Progressing Goal: Diagnostic test results will improve 03/29/2021 2342 by Blythe Stanford, RN Outcome: Progressing Goal: Respiratory complications will improve 03/29/2021 2342 by Blythe Stanford, RN Outcome: Progressing Goal: Cardiovascular complication will be avoided 03/29/2021 2342 by Blythe Stanford, RN Outcome: Progressing  Problem: Activity: Goal: Risk for activity intolerance will decrease 03/29/2021 2342 by Blythe Stanford, RN Outcome: Progressing  Problem: Nutrition: Goal: Adequate nutrition will be maintained 03/29/2021 2342 by Blythe Stanford, RN Outcome: Progressing   Problem: Coping: Goal: Level of anxiety will decrease 03/29/2021 2342 by Blythe Stanford, RN Outcome: Progressing   Problem: Elimination: Goal: Will not experience complications related to bowel motility 03/29/2021 2342 by Blythe Stanford, RN Outcome: Progressing Goal: Will not experience complications related to urinary retention 03/29/2021 2342 by Blythe Stanford, RN Outcome: Progressing  Problem: Pain Managment: Goal: General experience of comfort will improve 03/29/2021 2342 by Blythe Stanford, RN Outcome: Progressing  Problem: Safety: Goal: Ability to remain free from injury will improve 03/29/2021 2342 by Blythe Stanford,  RN Outcome: Progressing  Problem: Skin Integrity: Goal: Risk for impaired skin integrity will decrease 03/29/2021 2342 by Blythe Stanford, RN Outcome: Progressing   Problem: Education: Goal: Knowledge of risk factors and measures for prevention of condition will improve Outcome: Progressing   Problem: Coping: Goal: Psychosocial and spiritual needs will be supported Outcome: Progressing   Problem: Respiratory: Goal: Will maintain a patent airway Outcome: Progressing Goal: Complications related to the disease process, condition or treatment will be avoided or minimized Outcome: Progressing

## 2021-03-29 NOTE — Progress Notes (Signed)
PROGRESS NOTE    Christopher Davila  [id number] DOB: 05/22/1971 DOA: 03/26/2021 PCP: [name], MD   Brief Narrative:  50 y/o M who presented to [facility] on 10/10 with reports of shortness of breath.    The patient lives in a facility and is baseline 3L O2 dependent.  He is HD dependent M/W/F.  The facility [facility]) called EMS for shortness of breath. He normally wears 3L oxygen, oxygen sats were in the 60's and was increased to 5L at the facility without improvement. EMS was called and confirmed hypoxia.  The patient was COVID positive 2 days prior to admit (10/8) at the facility.  On arrival to the ER, he failed salter O2 and was placed on CPAP without improvement.  He ultimately was placed on BiPAP with improvement in saturations and work of breathing.  His last HD session was on Friday 10/7.  EKG showed ST.  He was hypertensive and exam suggestive of volume overload.  CXR showed chronic left pleural effusion.  The patient pulled his biPAP off and desaturated into the 50's.  He was treated empirically with rocephin.  COVID status was confirmed positive, flu negative. Initial labs showed K 5.2, glucose 120, BUN 43, sr cr 6.10, AG 17, troponin 29, WBC 11.4, Hgb 11.7 and platelets 165.     PCCM called for ICU admission.  Transferred under [facility] on 03/29/2021.  Assessment & Plan:   Active Problems:   Acute respiratory failure with hypoxia (HCC)   History of thoracentesis   S/P thoracentesis   Acute on chronic hypoxic Respiratory Failure/COVID-19 pneumonitis/pulmonary edema/fluid overload: Multifactorial, improving, still on 20 L of high flow oxygen.  Continue dialysis to remove fluid and continue remdesivir as well as steroids for COVID-19 pneumonia.  Inflammatory markers improving.  Continue to wean oxygen as able to.  Patient was encouraged to prone, out of bed to chair, to use incentive spirometry and flutter valve.  Essential hypertension: Continue amlodipine and hydralazine, blood  pressure controlled.  Beta-blockers on hold.  HLD: Continue atorvastatin.   DM type II: Continue SSI.  Blood sugar controlled.   Anemia of chronic disease: Stable.  DVT prophylaxis: heparin injection 5,000 Units Start: 03/26/21 2200 SCDs Start: 03/26/21 [id number]   Code Status: Full Code  Family Communication:  None present at bedside.  Plan of care discussed with patient in length and he verbalized understanding and agreed with it.  Status is: Inpatient  Remains inpatient appropriate because:Inpatient level of care appropriate due to severity of illness  Dispo: The patient is from: SNF              Anticipated d/c is to: SNF              Patient currently is not medically stable to d/c.   Difficult to place patient No        Estimated body mass index is 20.97 kg/m as calculated from the following:   Height as of 03/16/21: '5\' 3"'$  (1.6 m).   Weight as of this encounter: 53.7 kg.     Nutritional Assessment: Body mass index is 20.97 kg/m.Marland Kitchen Seen by dietician.  I agree with the assessment and plan as outlined below: Nutrition Status:        .  Skin Assessment: I have examined the patient's skin and I agree with the wound assessment as performed by the wound care RN as outlined below:    Consultants:  None  Procedures:  None  Antimicrobials:  Anti-infectives (From admission,  onward)    Start     Dose/Rate Route Frequency Ordered Stop   03/27/21 1000  remdesivir 100 mg in sodium chloride 0.9 % 100 mL IVPB       See Hyperspace for full Linked Orders Report.   100 mg 200 mL/hr over 30 Minutes Intravenous Daily 03/26/21 0921 03/31/21 0959   03/26/21 0930  remdesivir 200 mg in sodium chloride 0.9% 250 mL IVPB       See Hyperspace for full Linked Orders Report.   200 mg 580 mL/hr over 30 Minutes Intravenous Once 03/26/21 0921 03/26/21 1528   03/26/21 0715  cefTRIAXone (ROCEPHIN) 1 g in sodium chloride 0.9 % 100 mL IVPB        1 g 200 mL/hr over 30 Minutes  Intravenous  Once 03/26/21 0703 03/26/21 0933          Subjective: Patient seen and examined.  Despite of using 20 L of oxygen, patient looks very comfortable and able to speak in full sentences without dyspnea or tachypnea.  No complaints.  Objective: Vitals:   03/29/21 1000 03/29/21 1100 03/29/21 1108 03/29/21 1200  BP: (!) 156/92 (!) 140/91  133/87  Pulse: 85 82  78  Resp: '16 14  13  '$ Temp:   97.8 F (36.6 C) 97.8 F (36.6 C)  TempSrc:   Axillary Axillary  SpO2: 96% 98%  98%  Weight:        Intake/Output Summary (Last 24 hours) at 03/29/2021 1234 Last data filed at 03/29/2021 1200 Gross per 24 hour  Intake 580 ml  Output 4450 ml  Net -3870 ml   Filed Weights   03/27/21 0051 03/27/21 0500  Weight: 55.8 kg 53.7 kg    Examination:  General exam: Appears calm and comfortable, legally blind Respiratory system: Diminished breath sounds bilaterally respiratory effort normal. Cardiovascular system: S1 & S2 heard, RRR. No JVD, murmurs, rubs, gallops or clicks. No pedal edema. Gastrointestinal system: Abdomen is nondistended, soft and nontender. No organomegaly or masses felt. Normal bowel sounds heard. Central nervous system: Alert and oriented. No focal neurological deficits. Extremities: Symmetric 5 x 5 power. Skin: No rashes, lesions or ulcers   Data Reviewed: I have personally reviewed following labs and imaging studies  CBC: Recent Labs  Lab 03/26/21 0553 03/26/21 0609 03/27/21 0645 03/28/21 0514 03/29/21 0638  WBC 11.4*  --  10.5 16.2* 10.8*  NEUTROABS 9.5*  --  9.5* 14.1* 8.7*  HGB 11.7* 13.6 12.2* 11.4* 11.0*  HCT 39.0 40.0 38.4* 35.4* 33.3*  MCV 92.2  --  87.5 86.1 84.7  PLT 165  --  145* 163 [id number]*   Basic Metabolic Panel: Recent Labs  Lab 03/26/21 0553 03/26/21 0609 03/27/21 0645 03/28/21 0514 03/29/21 0638  NA 136 135 134* 129* 133*  K 5.2* 5.2* 4.3 4.7 3.6  CL 95* 101 93* 91* 96*  CO2 24  --  '26 25 26  '$ GLUCOSE 122* 120* 119* 93 112*   BUN 45* 43* 26* 54* 37*  CREATININE 6.10* 6.10* 3.66* 5.02* 3.79*  CALCIUM 9.3  --  8.5* 8.6* 8.3*  MG  --   --  2.1 2.4 2.2  PHOS  --   --  4.4 5.8* 3.5   GFR: Estimated Creatinine Clearance: 17.9 mL/min (A) (by C-G formula based on SCr of 3.79 mg/dL (H)). Liver Function Tests: Recent Labs  Lab 03/26/21 0553 03/27/21 0645 03/28/21 0514 03/29/21 0638  AST 28 34 30 30  ALT '16 15 12 14  '$ ALKPHOS 88  77 78 66  BILITOT 1.2 0.6 1.0 0.6  PROT 8.9* 8.5* 7.3 6.2*  ALBUMIN 3.9 3.5 3.1* 2.6*   No results for input(s): LIPASE, AMYLASE in the last 168 hours. No results for input(s): AMMONIA in the last 168 hours. Coagulation Profile: No results for input(s): INR, PROTIME in the last 168 hours. Cardiac Enzymes: No results for input(s): CKTOTAL, CKMB, CKMBINDEX, TROPONINI in the last 168 hours. BNP (last 3 results) No results for input(s): PROBNP in the last 8760 hours. HbA1C: No results for input(s): HGBA1C in the last 72 hours. CBG: Recent Labs  Lab 03/28/21 1943 03/28/21 2347 03/29/21 0329 03/29/21 0709 03/29/21 1104  GLUCAP 160* 138* 113* 107* 152*   Lipid Profile: No results for input(s): CHOL, HDL, LDLCALC, TRIG, CHOLHDL, LDLDIRECT in the last 72 hours. Thyroid Function Tests: No results for input(s): TSH, T4TOTAL, FREET4, T3FREE, THYROIDAB in the last 72 hours. Anemia Panel: Recent Labs    03/28/21 0514 03/29/21 0638  FERRITIN 3,962* 3,393*   Sepsis Labs: Recent Labs  Lab 03/26/21 0906  PROCALCITON 0.86    Recent Results (from the past 240 hour(s))  Resp Panel by RT-PCR (Flu A&B, Covid) Nasopharyngeal Swab     Status: Abnormal   Collection Time: 03/26/21  7:17 AM   Specimen: Nasopharyngeal Swab; Nasopharyngeal(NP) swabs in vial transport medium  Result Value Ref Range Status   SARS Coronavirus 2 by RT PCR POSITIVE (A) NEGATIVE Final    Comment: RESULT CALLED TO, READ BACK BY AND VERIFIED WITH: C LOZER RN 03/26/21 0904 JDW (NOTE) SARS-CoV-2 target nucleic  acids are DETECTED.  The SARS-CoV-2 RNA is generally detectable in upper respiratory specimens during the acute phase of infection. Positive results are indicative of the presence of the identified virus, but do not rule out bacterial infection or co-infection with other pathogens not detected by the test. Clinical correlation with patient history and other diagnostic information is necessary to determine patient infection status. The expected result is Negative.  Fact Sheet for Patients: EntrepreneurPulse.com.au  Fact Sheet for Healthcare Providers: IncredibleEmployment.be  This test is not yet approved or cleared by the [place] FDA and  has been authorized for detection and/or diagnosis of SARS-CoV-2 by FDA under an Emergency Use Authorization (EUA).  This EUA will remain in effect (meaning this test can be used ) for the duration of  the COVID-19 declaration under Section 564(b)(1) of the Act, 21 U.S.C. section 360bbb-3(b)(1), unless the authorization is terminated or revoked sooner.     Influenza A by PCR NEGATIVE NEGATIVE Final   Influenza B by PCR NEGATIVE NEGATIVE Final    Comment: (NOTE) The Xpert Xpress SARS-CoV-2/FLU/RSV plus assay is intended as an aid in the diagnosis of influenza from Nasopharyngeal swab specimens and should not be used as a sole basis for treatment. Nasal washings and aspirates are unacceptable for Xpert Xpress SARS-CoV-2/FLU/RSV testing.  Fact Sheet for Patients: EntrepreneurPulse.com.au  Fact Sheet for Healthcare Providers: IncredibleEmployment.be  This test is not yet approved or cleared by the [place] FDA and has been authorized for detection and/or diagnosis of SARS-CoV-2 by FDA under an Emergency Use Authorization (EUA). This EUA will remain in effect (meaning this test can be used) for the duration of the COVID-19 declaration under Section 564(b)(1) of  the Act, 21 U.S.C. section 360bbb-3(b)(1), unless the authorization is terminated or revoked.  Performed at [facility], [place] [address] [place], [place] [address]   MRSA Next Gen by PCR, Nasal  Status: Abnormal   Collection Time: 03/26/21  2:33 PM   Specimen: Nasal Mucosa; Nasal Swab  Result Value Ref Range Status   MRSA by PCR Next Gen DETECTED (A) NOT DETECTED Final    Comment: RESULT CALLED TO, READ BACK BY AND VERIFIED WITH: JAMIE COLE RN 03/26/21 '@1738'$  BY JW  (NOTE) The GeneXpert MRSA Assay (FDA approved for NASAL specimens only), is one component of a comprehensive MRSA colonization surveillance program. It is not intended to diagnose MRSA infection nor to guide or monitor treatment for MRSA infections. Test performance is not FDA approved in patients less than 68 years old. Performed at [facility], [place] [address] [place], [place] [address]       Radiology Studies: No results found.  Scheduled Meds:  acetaminophen  1,000 mg Oral Once   amLODipine  10 mg Oral Daily   aspirin  81 mg Oral Daily   atorvastatin  40 mg Oral Daily   brimonidine  1 drop Left Eye BID   calcium acetate  667 mg Oral TID WC   Chlorhexidine Gluconate Cloth  6 each Topical Q0600   docusate sodium  100 mg Oral BID   dorzolamide-timolol  1 drop Left Eye BID   heparin injection (subcutaneous)  5,000 Units Subcutaneous Q8H   hydrALAZINE  25 mg Oral Q8H   insulin aspart  0-6 Units Subcutaneous Q4H   latanoprost  1 drop Left Eye QHS   mupirocin ointment   Nasal BID   olopatadine  2 drop Both Eyes BID   polyethylene glycol  17 g Oral Daily   polyvinyl alcohol  1 drop Both Eyes TID   predniSONE  50 mg Oral Daily   pregabalin  25 mg Oral BID   senna  1 tablet Oral QHS   sertraline  25 mg Oral Daily   Continuous Infusions:  remdesivir 100 mg in NS 100 mL Stopped (03/29/21 1012)     LOS: 3 days   Time spent: 35 minutes   Darliss Cheney, MD [facility]  03/29/2021,  12:34 PM  Please page via [facility] and do not message via secure chat for anything urgent. Secure chat can be used for anything non urgent.  How to contact the [facility] Attending or Consulting provider [place] or covering provider during after hours [place], for this patient?  Check the care team in [facility] and look for a) attending/consulting TR[facility] provider listed and b) the [facility] team listed. Page or secure chat [phone]. Log into [email or web address] and use [place]'s universal password to access. If you do not have the password, please contact the hospital operator. Locate the [facility] provider you are looking for under [facility] and page to a number that you can be directly reached. If you still have difficulty reaching the provider, please page the [facility] (Director on Call) for the Hospitalists listed on amion for assistance.

## 2021-03-29 NOTE — Progress Notes (Signed)
[facility] Progress Note    Assessment/ Plan:   ESRD:  Outpatient orders: [facility], MWF, 4 hrs, F180, 2k, 2cal, edw 53kg, no heparin -will cont w/ MWF schedule, next HD tomorrow   # AHRF COVID positive 03/24/2021 Pulmonary edema, left pleural effusion -remdesivir per primary service, on pred -thora 10/10: 1200cc drained  # Hyponatremia -likely hypervolemic, managing with HD: 137 Na bath, UF as tolerated. Improving   # Volume/ hypertension: EDW 53kg. UF as tolerated   # Anemia of Chronic Kidney Disease: Hemoglobin 11 Receiving Mircera 52mg qtreatment, last dose 10/3 as an outpatient. Hold for now   # Secondary Hyperparathyroidism/Hyperphosphatemia: resume home binders. Phos/cal okay   # Vascular access: RUE AVF   # Additional recommendations: - Dose all meds for creatinine clearance < 10 ml/min  - Unless absolutely necessary, no MRIs with gadolinium.  - Implement save arm precautions.  Prefer needle sticks in the dorsum of the hands or wrists.  No blood pressure measurements in arm. - If blood transfusion is requested during hemodialysis sessions, please alert uKoreaprior to the session.  - If a hemodialysis catheter line culture is requested, please alert uKoreaas only hemodialysis nurses are able to collect those specimens.    Recommendations were discussed with the primary team.   VGean Quint MD [facility]  Subjective:   No acute events, tolerated HD yesterday, net uf 4L hfnc down to fio2 20%  Patient not examined today in the context of COVID-19+ (limiting exposure to other providers, PPE use). Chart reviewed, discussed with primary service.   Objective:   BP (!) 167/103   Pulse 86   Temp 97.8 F (36.6 C) (Oral)   Resp 19   Wt 53.7 kg   SpO2 95%   BMI 20.97 kg/m   Intake/Output Summary (Last 24 hours) at 03/29/2021 0948 Last data filed at 03/29/2021 0844 Gross per 24 hour  Intake 340 ml  Output 4650 ml  Net -4310 ml    Weight change:   Physical Exam: Not examined today in the context of patient being COVID positive. Chart reviewed.  Imaging: ECHOCARDIOGRAM COMPLETE  Result Date: 03/27/2021    ECHOCARDIOGRAM REPORT   Patient Name:   Christopher LASPISADate of Exam: 03/27/2021 Medical Rec #:  [id number]   Height:       63.0 in Accession #:    [id number]  Weight:       118.4 lb Date of Birth:  922-Jul-1972   BSA:          1.547 m Patient Age:    89years     BP:           166/101 mmHg Patient Gender: M            HR:           84 bpm. Exam Location:  Inpatient Procedure: 2D Echo, Cardiac Doppler and Color Doppler Indications:    CHF  History:        Patient has prior history of Echocardiogram examinations, most                 recent 02/13/2019. CHF, Signs/Symptoms:Shortness of Breath; Risk                 Factors:Hypertension, Diabetes and Dyslipidemia. Covid 19                 positive.  Sonographer:    RMerrie RoofRDCS Referring Phys: [id number] [facility]  C SMITH IMPRESSIONS  1. Left ventricular ejection fraction, by estimation, is 55 to 60%. The left ventricle has normal function. The left ventricle has no regional wall motion abnormalities. Left ventricular diastolic parameters are consistent with Grade II diastolic dysfunction (pseudonormalization).  2. Right ventricular systolic function is normal. The right ventricular size is moderately enlarged. There is moderately elevated pulmonary artery systolic pressure.  3. Left atrial size was mildly dilated.  4. Right atrial size was mildly dilated.  5. Moderate pleural effusion in the left lateral region.  6. The mitral valve is normal in structure. No evidence of mitral valve regurgitation. No evidence of mitral stenosis.  7. The aortic valve is tricuspid. Aortic valve regurgitation is not visualized.  8. The inferior vena cava is normal in size with <50% respiratory variability, suggesting right atrial pressure of 8 mmHg. Comparison(s): A prior study was performed on 02/13/2019.  Prior images reviewed side by side. RV size is larger. Pleural effusion is new. FINDINGS  Left Ventricle: Left ventricular ejection fraction, by estimation, is 55 to 60%. The left ventricle has normal function. The left ventricle has no regional wall motion abnormalities. The left ventricular internal cavity size was normal in size. There is  no left ventricular hypertrophy. Left ventricular diastolic parameters are consistent with Grade II diastolic dysfunction (pseudonormalization). Right Ventricle: The right ventricular size is moderately enlarged. No increase in right ventricular wall thickness. Right ventricular systolic function is normal. There is moderately elevated pulmonary artery systolic pressure. The tricuspid regurgitant  velocity is 3.30 m/s, and with an assumed right atrial pressure of 8 mmHg, the estimated right ventricular systolic pressure is 123XX123 mmHg. Left Atrium: Left atrial size was mildly dilated. Right Atrium: Right atrial size was mildly dilated. Pericardium: There is no evidence of pericardial effusion. Mitral Valve: The mitral valve is normal in structure. No evidence of mitral valve regurgitation. No evidence of mitral valve stenosis. Tricuspid Valve: The tricuspid valve is normal in structure. Tricuspid valve regurgitation is mild. Aortic Valve: The aortic valve is tricuspid. Aortic valve regurgitation is not visualized. Pulmonic Valve: The pulmonic valve was normal in structure. Pulmonic valve regurgitation is trivial. No evidence of pulmonic stenosis. Aorta: The aortic root is normal in size and structure. Venous: The inferior vena cava is normal in size with less than 50% respiratory variability, suggesting right atrial pressure of 8 mmHg. IAS/Shunts: The atrial septum is grossly normal. Additional Comments: There is a moderate pleural effusion in the left lateral region.  LEFT VENTRICLE PLAX 2D LVIDd:         4.90 cm   Diastology LVIDs:         3.60 cm   LV e' medial:    5.11 cm/s  LV PW:         1.10 cm   LV E/e' medial:  22.1 LV IVS:        0.80 cm   LV e' lateral:   8.81 cm/s LVOT diam:     1.90 cm   LV E/e' lateral: 12.8 LV SV:         50 LV SV Index:   32 LVOT Area:     2.84 cm  RIGHT VENTRICLE RV Basal diam:  4.50 cm RV Mid diam:    4.70 cm LEFT ATRIUM             Index        RIGHT ATRIUM           Index LA diam:  3.80 cm 2.46 cm/m   RA Area:     16.90 cm LA Vol (A2C):   61.8 ml 39.94 ml/m  RA Volume:   54.90 ml  35.48 ml/m LA Vol (A4C):   49.6 ml 32.05 ml/m LA Biplane Vol: 57.3 ml 37.03 ml/m  AORTIC VALVE LVOT Vmax:   79.00 cm/s LVOT Vmean:  55.400 cm/s LVOT VTI:    0.177 m  AORTA Ao Root diam: 2.60 cm MITRAL VALVE                TRICUSPID VALVE MV Area (PHT): 4.04 cm     TR Peak grad:   43.6 mmHg MV Decel Time: 188 msec     TR Vmax:        330.00 cm/s MV E velocity: 113.00 cm/s MV A velocity: 84.40 cm/s   SHUNTS MV E/A ratio:  1.34         Systemic VTI:  0.18 m                             Systemic Diam: 1.90 cm Rudean Haskell MD Electronically signed by Rudean Haskell MD Signature Date/Time: 03/27/2021/3:33:18 PM    Final     Labs: BMET Recent Labs  Lab 03/26/21 [id number] 03/26/21 [id number] 03/27/21 0645 03/28/21 0514 03/29/21 0638  NA 136 135 134* 129* 133*  K 5.2* 5.2* 4.3 4.7 3.6  CL 95* 101 93* 91* 96*  CO2 24  --  '26 25 26  '$ GLUCOSE 122* 120* 119* 93 112*  BUN 45* 43* 26* 54* 37*  CREATININE 6.10* 6.10* 3.66* 5.02* 3.79*  CALCIUM 9.3  --  8.5* 8.6* 8.3*  PHOS  --   --  4.4 5.8* 3.5   CBC Recent Labs  Lab 03/26/21 0553 03/26/21 0609 03/27/21 0645 03/28/21 0514 03/29/21 0638  WBC 11.4*  --  10.5 16.2* 10.8*  NEUTROABS 9.5*  --  9.5* 14.1* 8.7*  HGB 11.7* 13.6 12.2* 11.4* 11.0*  HCT 39.0 40.0 38.4* 35.4* 33.3*  MCV 92.2  --  87.5 86.1 84.7  PLT 165  --  145* 163 131*    Medications:     acetaminophen  1,000 mg Oral Once   amLODipine  10 mg Oral Daily   aspirin  81 mg Oral Daily   atorvastatin  40 mg Oral Daily   brimonidine   1 drop Left Eye BID   calcium acetate  667 mg Oral TID WC   Chlorhexidine Gluconate Cloth  6 each Topical Q0600   docusate sodium  100 mg Oral BID   dorzolamide-timolol  1 drop Left Eye BID   heparin injection (subcutaneous)  5,000 Units Subcutaneous Q8H   hydrALAZINE  25 mg Oral Q8H   insulin aspart  0-6 Units Subcutaneous Q4H   latanoprost  1 drop Left Eye QHS   mupirocin ointment   Nasal BID   olopatadine  2 drop Both Eyes BID   polyethylene glycol  17 g Oral Daily   polyvinyl alcohol  1 drop Both Eyes TID   predniSONE  50 mg Oral Daily   pregabalin  25 mg Oral BID   senna  1 tablet Oral QHS   sertraline  25 mg Oral Daily      Gean Quint, MD [facility] 03/29/2021, 9:48 AM

## 2021-03-30 ENCOUNTER — Inpatient Hospital Stay (HOSPITAL_COMMUNITY): Payer: Medicare Other

## 2021-03-30 DIAGNOSIS — J9601 Acute respiratory failure with hypoxia: Secondary | ICD-10-CM | POA: Diagnosis not present

## 2021-03-30 LAB — COMPREHENSIVE METABOLIC PANEL
ALT: 13 U/L (ref 0–44)
AST: 24 U/L (ref 15–41)
Albumin: 2.7 g/dL — ABNORMAL LOW (ref 3.5–5.0)
Alkaline Phosphatase: 70 U/L (ref 38–126)
Anion gap: 11 (ref 5–15)
BUN: 55 mg/dL — ABNORMAL HIGH (ref 6–20)
CO2: 26 mmol/L (ref 22–32)
Calcium: 8.3 mg/dL — ABNORMAL LOW (ref 8.9–10.3)
Chloride: 94 mmol/L — ABNORMAL LOW (ref 98–111)
Creatinine, Ser: 4.78 mg/dL — ABNORMAL HIGH (ref 0.61–1.24)
GFR, Estimated: 14 mL/min — ABNORMAL LOW (ref >60)
Glucose, Bld: 230 mg/dL — ABNORMAL HIGH (ref 70–99)
Potassium: 3.9 mmol/L (ref 3.5–5.1)
Sodium: 131 mmol/L — ABNORMAL LOW (ref 135–145)
Total Bilirubin: 0.8 mg/dL (ref 0.3–1.2)
Total Protein: 6.3 g/dL — ABNORMAL LOW (ref 6.5–8.1)

## 2021-03-30 LAB — CBC WITH DIFFERENTIAL/PLATELET
Abs Immature Granulocytes: 0.06 10*3/uL (ref 0.00–0.07)
Basophils Absolute: 0 10*3/uL (ref 0.0–0.1)
Basophils Relative: 0 %
Eosinophils Absolute: 0 10*3/uL (ref 0.0–0.5)
Eosinophils Relative: 0 %
HCT: 31.7 % — ABNORMAL LOW (ref 39.0–52.0)
Hemoglobin: 10.3 g/dL — ABNORMAL LOW (ref 13.0–17.0)
Immature Granulocytes: 1 %
Lymphocytes Relative: 8 %
Lymphs Abs: 0.9 10*3/uL (ref 0.7–4.0)
MCH: 27.5 pg (ref 26.0–34.0)
MCHC: 32.5 g/dL (ref 30.0–36.0)
MCV: 84.8 fL (ref 80.0–100.0)
Monocytes Absolute: 0.6 10*3/uL (ref 0.1–1.0)
Monocytes Relative: 5 %
Neutro Abs: 10.6 10*3/uL — ABNORMAL HIGH (ref 1.7–7.7)
Neutrophils Relative %: 86 %
Platelets: 145 10*3/uL — ABNORMAL LOW (ref 150–400)
RBC: 3.74 MIL/uL — ABNORMAL LOW (ref 4.22–5.81)
RDW: 14.9 % (ref 11.5–15.5)
WBC: 12.1 10*3/uL — ABNORMAL HIGH (ref 4.0–10.5)
nRBC: 0 % (ref 0.0–0.2)

## 2021-03-30 LAB — MAGNESIUM: Magnesium: 2.3 mg/dL (ref 1.7–2.4)

## 2021-03-30 LAB — D-DIMER, QUANTITATIVE: D-Dimer, Quant: 4.49 ug/mL-FEU — ABNORMAL HIGH (ref 0.00–0.50)

## 2021-03-30 LAB — PHOSPHORUS: Phosphorus: 2.8 mg/dL (ref 2.5–4.6)

## 2021-03-30 LAB — PROCALCITONIN: Procalcitonin: 2.07 ng/mL

## 2021-03-30 LAB — C-REACTIVE PROTEIN: CRP: 2.4 mg/dL — ABNORMAL HIGH (ref <1.0)

## 2021-03-30 LAB — GLUCOSE, CAPILLARY
Glucose-Capillary: 109 mg/dL — ABNORMAL HIGH (ref 70–99)
Glucose-Capillary: 87 mg/dL (ref 70–99)
Glucose-Capillary: 122 mg/dL — ABNORMAL HIGH (ref 70–99)
Glucose-Capillary: 233 mg/dL — ABNORMAL HIGH (ref 70–99)
Glucose-Capillary: 331 mg/dL — ABNORMAL HIGH (ref 70–99)

## 2021-03-30 LAB — FERRITIN: Ferritin: 2480 ng/mL — ABNORMAL HIGH (ref 24–336)

## 2021-03-30 MED ORDER — LIP MEDEX EX OINT
1.0000 "application " | TOPICAL_OINTMENT | CUTANEOUS | Status: DC | PRN
  Filled 2021-03-30: qty 7

## 2021-03-30 MED ORDER — SODIUM CHLORIDE 0.9 % IV SOLN
500.0000 mg | INTRAVENOUS | Status: AC
  Administered 2021-03-30 – 2021-04-03 (×5): 500 mg via INTRAVENOUS
  Filled 2021-03-30 (×5): qty 500

## 2021-03-30 MED ORDER — ALUM & MAG HYDROXIDE-SIMETH 200-200-20 MG/5ML PO SUSP
30.0000 mL | ORAL | Status: DC | PRN
  Administered 2021-03-30 – 2021-04-10 (×12): 30 mL via ORAL
  Filled 2021-03-30 (×12): qty 30

## 2021-03-30 MED ORDER — SODIUM CHLORIDE 0.9 % IV SOLN
2.0000 g | INTRAVENOUS | Status: AC
  Administered 2021-03-30 – 2021-04-03 (×5): 2 g via INTRAVENOUS
  Filled 2021-03-30 (×5): qty 20

## 2021-03-30 MED ORDER — IOHEXOL 350 MG/ML SOLN
75.0000 mL | Freq: Once | INTRAVENOUS | Status: AC | PRN
  Administered 2021-03-30: 65 mL via INTRAVENOUS

## 2021-03-30 NOTE — Progress Notes (Addendum)
[facility] Progress Note    Assessment/ Plan:   1) ESRD:  Outpatient orders: [facility], MWF, 4 hrs, F180, 2k, 2cal, edw 53kg, no heparin -will cont w/ MWF schedule, next HD today; Net UF 4L on 10/12   2)  AHRF COVID positive 03/24/2021 Pulmonary edema, left pleural effusion -remdesivir per primary service, on pred -thora 10/10: 1200cc drained  3)  Hyponatremia -likely hypervolemic, managing with HD: 137 Na bath, UF as tolerated. Improving   4)  Volume/ hypertension: EDW 53kg. UF as tolerated   5)  Anemia of Chronic Kidney Disease: Hemoglobin 11 Receiving Mircera 74mg qtreatment, last dose 10/3 as an outpatient. Hold for now   6) Secondary Hyperparathyroidism/Hyperphosphatemia: resume home binders. Phos/cal okay (phos 3.5 10/14)   7)  Vascular access: RUE BBT   8) Additional recommendations: - Dose all meds for creatinine clearance < 10 ml/min  - Unless absolutely necessary, no MRIs with gadolinium.  -  No blood pressure measurements in rt arm. - If blood transfusion is requested during hemodialysis sessions, please alert uKoreaprior to the session.   Subjective:   No acute events, denies dyspneic, cough but not able to get the phlegm out, hfnc     Objective:   BP (!) 161/97   Pulse 80   Temp 97.9 F (36.6 C) (Oral)   Resp 19   Wt 53.7 kg   SpO2 97%   BMI 20.97 kg/m   Intake/Output Summary (Last 24 hours) at 03/30/2021 0737 Last data filed at 03/30/2021 0555 Gross per 24 hour  Intake 580 ml  Output 475 ml  Net 105 ml   Weight change:   Physical Exam: GEN: NAD, A&Ox3, NCAT HEENT: No conjunctival pallor, blind NECK: Supple, no thyromegaly LUNGS: Rhonchi present, not tachypneic CV: RRR, No M/R/G ABD: SNDNT +BS  EXT: No lower extremity edema ACCESS: Rt BBT pulsatile   Imaging: No results found.  Labs: BMET Recent Labs  Lab 03/26/21 0553 03/26/21 0609 03/27/21 0645 03/28/21 0514 03/29/21 0638 03/30/21 0128  NA 136 135 134* 129*  133* 131*  K 5.2* 5.2* 4.3 4.7 3.6 3.9  CL 95* 101 93* 91* 96* 94*  CO2 24  --  '26 25 26 26  '$ GLUCOSE 122* 120* 119* 93 112* 230*  BUN 45* 43* 26* 54* 37* 55*  CREATININE 6.10* 6.10* 3.66* 5.02* 3.79* 4.78*  CALCIUM 9.3  --  8.5* 8.6* 8.3* 8.3*  PHOS  --   --  4.4 5.8* 3.5 2.8   CBC Recent Labs  Lab 03/27/21 0645 03/28/21 0514 03/29/21 0638 03/30/21 0128  WBC 10.5 16.2* 10.8* 12.1*  NEUTROABS 9.5* 14.1* 8.7* 10.6*  HGB 12.2* 11.4* 11.0* 10.3*  HCT 38.4* 35.4* 33.3* 31.7*  MCV 87.5 86.1 84.7 84.8  PLT 145* 163 131* 145*    Medications:     acetaminophen  1,000 mg Oral Once   amLODipine  10 mg Oral Daily   aspirin  81 mg Oral Daily   atorvastatin  40 mg Oral Daily   brimonidine  1 drop Left Eye BID   calcium acetate  667 mg Oral TID WC   Chlorhexidine Gluconate Cloth  6 each Topical Q0600   docusate sodium  100 mg Oral BID   dorzolamide-timolol  1 drop Left Eye BID   heparin injection (subcutaneous)  5,000 Units Subcutaneous Q8H   hydrALAZINE  25 mg Oral Q8H   insulin aspart  0-6 Units Subcutaneous Q4H   latanoprost  1 drop Left Eye QHS  mupirocin ointment   Nasal BID   olopatadine  2 drop Both Eyes BID   polyethylene glycol  17 g Oral Daily   polyvinyl alcohol  1 drop Both Eyes TID   predniSONE  50 mg Oral Daily   pregabalin  25 mg Oral BID   senna  1 tablet Oral QHS   sertraline  25 mg Oral Daily      Gean Quint, MD [facility] 03/30/2021, 7:37 AM

## 2021-03-30 NOTE — Progress Notes (Signed)
PROGRESS NOTE    Christopher Davila  [id number] DOB: 03/19/1971 DOA: 03/26/2021 PCP: [name], MD   Brief Narrative:  50 y/o M who presented to [facility] on 10/10 with reports of shortness of breath.    The patient lives in a facility and is baseline 3L O2 dependent.  He is HD dependent M/W/F.  The facility [facility]) called EMS for shortness of breath. He normally wears 3L oxygen, oxygen sats were in the 60's and was increased to 5L at the facility without improvement. EMS was called and confirmed hypoxia.  The patient was COVID positive 2 days prior to admit (10/8) at the facility.  On arrival to the ER, he failed salter O2 and was placed on CPAP without improvement.  He ultimately was placed on BiPAP with improvement in saturations and work of breathing.  His last HD session was on Friday 10/7.  EKG showed ST.  He was hypertensive and exam suggestive of volume overload.  CXR showed chronic left pleural effusion.  The patient pulled his biPAP off and desaturated into the 50's.  He was treated empirically with rocephin.  COVID status was confirmed positive, flu negative. Initial labs showed K 5.2, glucose 120, BUN 43, sr cr 6.10, AG 17, troponin 29, WBC 11.4, Hgb 11.7 and platelets 165.     PCCM called for ICU admission.  Transferred under [facility] on 03/29/2021.  Assessment & Plan:   Active Problems:   Acute respiratory failure with hypoxia (HCC)   History of thoracentesis   S/P thoracentesis   Acute on chronic hypoxic Respiratory Failure/COVID-19 pneumonitis/pulmonary edema/fluid overload: Multifactorial,  still on 20 L of high flow oxygen but despite of that, he denies any shortness of breath and looks very comfortable without any dyspnea or tachypnea..  Continue dialysis to remove fluid and continue remdesivir day 5/5 as well as steroids for COVID-19 pneumonia.  Inflammatory markers improving.  Continue to wean oxygen as able to.  Patient was encouraged to prone, out of bed to chair, to  use incentive spirometry and flutter valve.  Significantly elevated D-dimer, CT angiogram negative for PE.  Procalcitonin was elevated 4 days ago, much higher today.  Suspect possible superimposed bacterial infection.  Will start on Rocephin and Zithromax.  Essential hypertension: Continue amlodipine and hydralazine, blood pressure controlled.  Beta-blockers on hold.  HLD: Continue atorvastatin.   DM type II: Continue SSI.  Blood sugar controlled.   Anemia of chronic disease: Stable.  DVT prophylaxis: heparin injection 5,000 Units Start: 03/26/21 2200 SCDs Start: 03/26/21 [id number]   Code Status: Full Code  Family Communication:  None present at bedside.  Plan of care discussed with patient in length and he verbalized understanding and agreed with it.  Status is: Inpatient  Remains inpatient appropriate because:Inpatient level of care appropriate due to severity of illness  Dispo: The patient is from: SNF              Anticipated d/c is to: SNF              Patient currently is not medically stable to d/c.   Difficult to place patient No        Estimated body mass index is 20.97 kg/m as calculated from the following:   Height as of 03/16/21: '5\' 3"'$  (1.6 m).   Weight as of this encounter: 53.7 kg.     Nutritional Assessment: Body mass index is 20.97 kg/m.Marland Kitchen Seen by dietician.  I agree with the assessment and plan as outlined  below: Nutrition Status:        .  Skin Assessment: I have examined the patient's skin and I agree with the wound assessment as performed by the wound care RN as outlined below:    Consultants:  None  Procedures:  None  Antimicrobials:  Anti-infectives (From admission, onward)    Start     Dose/Rate Route Frequency Ordered Stop   03/27/21 1000  remdesivir 100 mg in sodium chloride 0.9 % 100 mL IVPB       See Hyperspace for full Linked Orders Report.   100 mg 200 mL/hr over 30 Minutes Intravenous Daily 03/26/21 0921 03/30/21 1211   03/26/21  0930  remdesivir 200 mg in sodium chloride 0.9% 250 mL IVPB       See Hyperspace for full Linked Orders Report.   200 mg 580 mL/hr over 30 Minutes Intravenous Once 03/26/21 0921 03/26/21 1528   03/26/21 0715  cefTRIAXone (ROCEPHIN) 1 g in sodium chloride 0.9 % 100 mL IVPB        1 g 200 mL/hr over 30 Minutes Intravenous  Once 03/26/21 0703 03/26/21 0933          Subjective: Patient seen and examined.  Patient blind.  He has no complaints.  Denies any shortness of breath.  Objective: Vitals:   03/30/21 0612 03/30/21 0809 03/30/21 0825 03/30/21 1215  BP: (!) 161/97 (!) 158/94  (!) 153/88  Pulse: 80 79  83  Resp: '19 16  19  '$ Temp:  97.8 F (36.6 C)  98.1 F (36.7 C)  TempSrc:  Oral  Oral  SpO2: 97% 96% 97% 97%  Weight:        Intake/Output Summary (Last 24 hours) at 03/30/2021 1231 Last data filed at 03/30/2021 1222 Gross per 24 hour  Intake 240 ml  Output 575 ml  Net -335 ml    Filed Weights   03/27/21 0051 03/27/21 0500  Weight: 55.8 kg 53.7 kg    Examination:  General exam: Appears calm and comfortable  Respiratory system: Diminished breath sounds bilaterally, respiratory effort normal. Cardiovascular system: S1 & S2 heard, RRR. No JVD, murmurs, rubs, gallops or clicks. No pedal edema. Gastrointestinal system: Abdomen is nondistended, soft and nontender. No organomegaly or masses felt. Normal bowel sounds heard. Central nervous system: Alert and oriented. No focal neurological deficits. Extremities: Symmetric 5 x 5 power. Skin: No rashes, lesions or ulcers.  Psychiatry: Judgement and insight appear normal. Mood & affect appropriate.    Data Reviewed: I have personally reviewed following labs and imaging studies  CBC: Recent Labs  Lab 03/26/21 0553 03/26/21 0609 03/27/21 0645 03/28/21 0514 03/29/21 0638 03/30/21 0128  WBC 11.4*  --  10.5 16.2* 10.8* 12.1*  NEUTROABS 9.5*  --  9.5* 14.1* 8.7* 10.6*  HGB 11.7* 13.6 12.2* 11.4* 11.0* 10.3*  HCT 39.0  40.0 38.4* 35.4* 33.3* 31.7*  MCV 92.2  --  87.5 86.1 84.7 84.8  PLT 165  --  145* 163 131* 145*    Basic Metabolic Panel: Recent Labs  Lab 03/26/21 0553 03/26/21 0609 03/27/21 0645 03/28/21 0514 03/29/21 0638 03/30/21 0128  NA 136 135 134* 129* 133* 131*  K 5.2* 5.2* 4.3 4.7 3.6 3.9  CL 95* 101 93* 91* 96* 94*  CO2 24  --  '26 25 26 26  '$ GLUCOSE 122* 120* 119* 93 112* 230*  BUN 45* 43* 26* 54* 37* 55*  CREATININE 6.10* 6.10* 3.66* 5.02* 3.79* 4.78*  CALCIUM 9.3  --  8.5* 8.6*  8.3* 8.3*  MG  --   --  2.1 2.4 2.2 2.3  PHOS  --   --  4.4 5.8* 3.5 2.8    GFR: Estimated Creatinine Clearance: 14.2 mL/min (A) (by C-G formula based on SCr of 4.78 mg/dL (H)). Liver Function Tests: Recent Labs  Lab 03/26/21 0553 03/27/21 0645 03/28/21 0514 03/29/21 0638 03/30/21 0128  AST 28 34 '30 30 24  '$ ALT '16 15 12 14 13  '$ ALKPHOS 88 77 78 66 70  BILITOT 1.2 0.6 1.0 0.6 0.8  PROT 8.9* 8.5* 7.3 6.2* 6.3*  ALBUMIN 3.9 3.5 3.1* 2.6* 2.7*    No results for input(s): LIPASE, AMYLASE in the last 168 hours. No results for input(s): AMMONIA in the last 168 hours. Coagulation Profile: No results for input(s): INR, PROTIME in the last 168 hours. Cardiac Enzymes: No results for input(s): CKTOTAL, CKMB, CKMBINDEX, TROPONINI in the last 168 hours. BNP (last 3 results) No results for input(s): PROBNP in the last 8760 hours. HbA1C: No results for input(s): HGBA1C in the last 72 hours. CBG: Recent Labs  Lab 03/29/21 1613 03/29/21 1959 03/30/21 0345 03/30/21 0812 03/30/21 1218  GLUCAP 252* 386* 109* 87 122*    Lipid Profile: No results for input(s): CHOL, HDL, LDLCALC, TRIG, CHOLHDL, LDLDIRECT in the last 72 hours. Thyroid Function Tests: No results for input(s): TSH, T4TOTAL, FREET4, T3FREE, THYROIDAB in the last 72 hours. Anemia Panel: Recent Labs    03/29/21 0638 03/30/21 0128  FERRITIN 3,393* 2,480*    Sepsis Labs: Recent Labs  Lab 03/26/21 0906 03/30/21 0128  PROCALCITON  0.86 2.07     Recent Results (from the past 240 hour(s))  Resp Panel by RT-PCR (Flu A&B, Covid) Nasopharyngeal Swab     Status: Abnormal   Collection Time: 03/26/21  7:17 AM   Specimen: Nasopharyngeal Swab; Nasopharyngeal(NP) swabs in vial transport medium  Result Value Ref Range Status   SARS Coronavirus 2 by RT PCR POSITIVE (A) NEGATIVE Final    Comment: RESULT CALLED TO, READ BACK BY AND VERIFIED WITH: C LOZER RN 03/26/21 0904 JDW (NOTE) SARS-CoV-2 target nucleic acids are DETECTED.  The SARS-CoV-2 RNA is generally detectable in upper respiratory specimens during the acute phase of infection. Positive results are indicative of the presence of the identified virus, but do not rule out bacterial infection or co-infection with other pathogens not detected by the test. Clinical correlation with patient history and other diagnostic information is necessary to determine patient infection status. The expected result is Negative.  Fact Sheet for Patients: EntrepreneurPulse.com.au  Fact Sheet for Healthcare Providers: IncredibleEmployment.be  This test is not yet approved or cleared by the [place] FDA and  has been authorized for detection and/or diagnosis of SARS-CoV-2 by FDA under an Emergency Use Authorization (EUA).  This EUA will remain in effect (meaning this test can be used ) for the duration of  the COVID-19 declaration under Section 564(b)(1) of the Act, 21 U.S.C. section 360bbb-3(b)(1), unless the authorization is terminated or revoked sooner.     Influenza A by PCR NEGATIVE NEGATIVE Final   Influenza B by PCR NEGATIVE NEGATIVE Final    Comment: (NOTE) The Xpert Xpress SARS-CoV-2/FLU/RSV plus assay is intended as an aid in the diagnosis of influenza from Nasopharyngeal swab specimens and should not be used as a sole basis for treatment. Nasal washings and aspirates are unacceptable for Xpert Xpress  SARS-CoV-2/FLU/RSV testing.  Fact Sheet for Patients: EntrepreneurPulse.com.au  Fact Sheet for Healthcare Providers: IncredibleEmployment.be  This test  is not yet approved or cleared by the [place] and has been authorized for detection and/or diagnosis of SARS-CoV-2 by FDA under an Emergency Use Authorization (EUA). This EUA will remain in effect (meaning this test can be used) for the duration of the COVID-19 declaration under Section 564(b)(1) of the Act, 21 U.S.C. section 360bbb-3(b)(1), unless the authorization is terminated or revoked.  Performed at [facility], [place] [address] [place], [place] [address]   MRSA Next Gen by PCR, Nasal     Status: Abnormal   Collection Time: 03/26/21  2:33 PM   Specimen: Nasal Mucosa; Nasal Swab  Result Value Ref Range Status   MRSA by PCR Next Gen DETECTED (A) NOT DETECTED Final    Comment: RESULT CALLED TO, READ BACK BY AND VERIFIED WITH: JAMIE COLE RN 03/26/21 '@1738'$  BY JW  (NOTE) The GeneXpert MRSA Assay (FDA approved for NASAL specimens only), is one component of a comprehensive MRSA colonization surveillance program. It is not intended to diagnose MRSA infection nor to guide or monitor treatment for MRSA infections. Test performance is not FDA approved in patients less than 22 years old. Performed at [facility], [place] [address], [place], [place] [address]        Radiology Studies: CT Angio Chest Pulmonary Embolism (PE) W or WO Contrast  Result Date: 03/30/2021 CLINICAL DATA:  PE suspected, COVID positive, cough, congestion EXAM: CT ANGIOGRAPHY CHEST WITH CONTRAST TECHNIQUE: Multidetector CT imaging of the chest was performed using the standard protocol during bolus administration of intravenous contrast. Multiplanar CT image reconstructions and MIPs were obtained to evaluate the vascular anatomy. CONTRAST:  76m OMNIPAQUE IOHEXOL 350 MG/ML SOLN COMPARISON:  None. FINDINGS:  Cardiovascular: Satisfactory opacification of the pulmonary arteries to the segmental level. No evidence of pulmonary embolism. Normal heart size. Left coronary artery calcifications. No pericardial effusion. Scattered aortic atherosclerosis. Mediastinum/Nodes: No enlarged mediastinal, hilar, or axillary lymph nodes. Thyroid gland, trachea, and esophagus demonstrate no significant findings. Lungs/Pleura: Moderate bilateral pleural effusions and associated atelectasis or consolidation. Extensive ground-glass airspace opacity throughout the aerated portions of the lungs. Upper Abdomen: No acute abnormality. Musculoskeletal: No chest wall abnormality. No acute or significant osseous findings. Review of the MIP images confirms the above findings. IMPRESSION: 1. Negative examination for pulmonary embolism. 2. Moderate bilateral pleural effusions and associated atelectasis or consolidation. 3. Extensive ground-glass airspace opacity throughout the aerated portions of the lungs, consistent with multifocal infection and reported COVID-19 airspace disease. 4. Coronary artery disease. Aortic Atherosclerosis (ICD10-I70.0). Electronically Signed   By: ADelanna AhmadiM.D.   On: 03/30/2021 10:01    Scheduled Meds:  acetaminophen  1,000 mg Oral Once   amLODipine  10 mg Oral Daily   aspirin  81 mg Oral Daily   atorvastatin  40 mg Oral Daily   brimonidine  1 drop Left Eye BID   calcium acetate  667 mg Oral TID WC   Chlorhexidine Gluconate Cloth  6 each Topical Q0600   docusate sodium  100 mg Oral BID   dorzolamide-timolol  1 drop Left Eye BID   heparin injection (subcutaneous)  5,000 Units Subcutaneous Q8H   hydrALAZINE  25 mg Oral Q8H   insulin aspart  0-6 Units Subcutaneous Q4H   latanoprost  1 drop Left Eye QHS   mupirocin ointment   Nasal BID   olopatadine  2 drop Both Eyes BID   polyethylene glycol  17 g Oral Daily   polyvinyl alcohol  1 drop Both Eyes  TID   predniSONE  50 mg Oral Daily   pregabalin  25 mg  Oral BID   senna  1 tablet Oral QHS   sertraline  25 mg Oral Daily   Continuous Infusions:     LOS: 4 days   Time spent: 30 minutes   Darliss Cheney, MD [facility]  03/30/2021, 12:31 PM  Please page via [facility] and do not message via secure chat for anything urgent. Secure chat can be used for anything non urgent.  How to contact the [facility] Attending or Consulting provider [place] or covering provider during after hours [place], for this patient?  Check the care team in [facility] and look for a) attending/consulting [facility] provider listed and b) the [facility] team listed. Page or secure chat [phone]. Log into [email or web address] and use [place]'s universal password to access. If you do not have the password, please contact the hospital operator. Locate the [facility] provider you are looking for under [facility] and page to a number that you can be directly reached. If you still have difficulty reaching the provider, please page the [facility] (Director on Call) for the Hospitalists listed on amion for assistance.

## 2021-03-30 NOTE — NC FL2 (Signed)
[place] LEVEL OF CARE SCREENING TOOL     IDENTIFICATION  Patient Name: Christopher Davila Birthdate: [birth year] Sex: male Admission Date (Current Location): 03/26/2021  [facility] and [place] Number:  Herbalist and Address:  The [place]. [facility], [place] [address], [place], [place] [address]      Provider Number: [id number]  Attending Physician Name and Address:  Darliss Cheney, MD  Relative Name and Phone Number:  Tenoch Lowary (Relative) [phone]    Current Level of Care: SNF Recommended Level of Care: [place] Prior Approval Number:    Date Approved/Denied:   PASRR Number: [id number]  Discharge Plan: SNF    Current Diagnoses: Patient Active Problem List   Diagnosis Date Noted   History of thoracentesis    S/P thoracentesis    Acute respiratory failure with hypoxia ([place]) 03/26/2021   Pressure injury of skin 11/06/2019   ESRD on dialysis [facility])    Heart failure with preserved ejection fraction ([place])    Goals of care, counseling/discussion    Palliative care encounter    Diarrhea [id number]   Metabolic acidosis, increased anion gap 10/13/2019   Acute urinary retention 10/13/2019   Weakness 10/05/2019   Acute on chronic diastolic CHF (congestive heart failure) ([place]) 09/27/2019   Vision loss of left eye 09/21/2019   Acute kidney injury superimposed on chronic kidney disease ([place]) 09/19/2019   Hyponatremia 09/19/2019   Homeless 09/19/2019   Anemia of chronic disease 09/19/2019   Vision loss, left eye 09/19/2019   Acute loss of vision, right 06/16/2019   Hypertensive urgency 04/17/2019   Hypokalemia 04/17/2019   Normocytic anemia 04/17/2019   Hyperglycemia    CKD (chronic kidney disease) stage 4, GFR 15-29 ml/min (HCC)    Alcohol use    Gastroparesis due to DM ([place])    Essential hypertension    Hyperlipidemia    Type 2 diabetes mellitus with hypoglycemia ([place]) 06/26/2012    Orientation RESPIRATION BLADDER  Height & Weight     Time, Place, Self, Situation  O2 Continent Weight:  (unable to obtain) Height:     BEHAVIORAL SYMPTOMS/MOOD NEUROLOGICAL BOWEL NUTRITION STATUS      Continent Diet (see dc summary)  AMBULATORY STATUS COMMUNICATION OF NEEDS Skin   Limited Assist Verbally Normal                       Personal Care Assistance Level of Assistance  Feeding, Dressing, Bathing Bathing Assistance: Limited assistance Feeding assistance: Limited assistance Dressing Assistance: Limited assistance     Functional Limitations Info  Sight, Hearing, Speech Sight Info: Impaired Hearing Info: Adequate Speech Info: Adequate    SPECIAL CARE FACTORS FREQUENCY  PT (By licensed PT), OT (By licensed OT)     PT Frequency: 5x per week OT Frequency: 5x per week            Contractures Contractures Info: Not present    Additional Factors Info  Code Status, Allergies, Psychotropic, Insulin Sliding Scale Isolation Precautions Code Status Info: Full Code Allergies Info: Morphine And Related Psychotropic Info: sertraline (ZOLOFT) tablet 25 mg Insulin Sliding Scale Info: insulin aspart (novoLOG) injection 0-6 Units Isolation Precautions: COVID + on 03/26/21       Current Medications (03/30/2021):  This is the current hospital active medication list Current Facility-Administered Medications  Medication Dose Route Frequency Provider Last Rate Last Admin   acetaminophen (TYLENOL) tablet 1,000 mg  1,000 mg Oral Once Cardama, Grayce Sessions, MD  acetaminophen (TYLENOL) tablet 650 mg  650 mg Oral Q6H PRN Noe Gens L, NP   650 mg at 03/27/21 0546   alum & mag hydroxide-simeth (MAALOX/MYLANTA) 200-200-20 MG/5ML suspension 30 mL  30 mL Oral Q4H PRN Darliss Cheney, MD   30 mL at 03/30/21 0012   amLODipine (NORVASC) tablet 10 mg  10 mg Oral Daily Marianna Payment, MD   10 mg at 03/30/21 1131   aspirin chewable tablet 81 mg  81 mg Oral Daily Marianna Payment, MD   81 mg at 03/30/21 1131    atorvastatin (LIPITOR) tablet 40 mg  40 mg Oral Daily Marianna Payment, MD   40 mg at 03/30/21 1131   azithromycin (ZITHROMAX) 500 mg in sodium chloride 0.9 % 250 mL IVPB  500 mg Intravenous Q24H Darliss Cheney, MD 250 mL/hr at 03/30/21 1549 500 mg at 03/30/21 1549   brimonidine (ALPHAGAN) 0.15 % ophthalmic solution 1 drop  1 drop Left Eye BID Marianna Payment, MD   1 drop at 03/30/21 1142   calcium acetate (PHOSLO) capsule 667 mg  667 mg Oral TID WC Gean Quint, MD   667 mg at 03/30/21 1130   cefTRIAXone (ROCEPHIN) 2 g in sodium chloride 0.9 % 100 mL IVPB  2 g Intravenous Q24H Pahwani, Einar Grad, MD 200 mL/hr at 03/30/21 1450 2 g at 03/30/21 1450   Chlorhexidine Gluconate Cloth 2 % PADS 6 each  6 each Topical Q0600 Gean Quint, MD   6 each at 03/30/21 0617   chlorpheniramine-HYDROcodone ([place]) 10-8 MG/5ML suspension 5 mL  5 mL Oral Q12H PRN Noe Gens L, NP       diphenhydrAMINE (BENADRYL) capsule 25 mg  25 mg Oral Q4H PRN Marianna Payment, MD   25 mg at 03/27/21 2140   docusate sodium (COLACE) capsule 100 mg  100 mg Oral BID Ollis, Brandi L, NP   100 mg at 03/30/21 1131   dorzolamide-timolol (COSOPT) 22.3-6.8 MG/ML ophthalmic solution 1 drop  1 drop Left Eye BID Marianna Payment, MD   1 drop at 03/30/21 1142   guaiFENesin-dextromethorphan (ROBITUSSIN DM) 100-10 MG/5ML syrup 10 mL  10 mL Oral Q4H PRN Noe Gens L, NP       heparin injection 5,000 Units  5,000 Units Subcutaneous Q8H Candee Furbish, MD   5,000 Units at 03/30/21 1447   hydrALAZINE (APRESOLINE) tablet 25 mg  25 mg Oral Q8H Mauri Brooklyn, MD   25 mg at 03/30/21 1447   insulin aspart (novoLOG) injection 0-6 Units  0-6 Units Subcutaneous Q4H Ollis, Brandi L, NP   4 Units at 03/30/21 0047   latanoprost (XALATAN) 0.005 % ophthalmic solution 1 drop  1 drop Left Eye Reino Kent, MD   1 drop at 03/29/21 2232   lip balm (CARMEX) ointment 1 application  1 application Topical PRN Darliss Cheney, MD       metoprolol tartrate (LOPRESSOR) injection  2.5-5 mg  2.5-5 mg Intravenous Q3H PRN Noe Gens L, NP   5 mg at 03/28/21 0056   mupirocin ointment (BACTROBAN) 2 %   Nasal BID Simonne Maffucci B, MD   Given at 03/30/21 1130   olopatadine (PATANOL) 0.1 % ophthalmic solution 2 drop  2 drop Both Eyes BID Marianna Payment, MD   2 drop at 03/30/21 1144   ondansetron (ZOFRAN) tablet 4 mg  4 mg Oral Q6H PRN Noe Gens L, NP       Or   ondansetron (ZOFRAN) injection 4 mg  4 mg Intravenous Q6H PRN  Donita Brooks, NP       oxyCODONE-acetaminophen (PERCOCET/ROXICET) 5-325 MG per tablet 1 tablet  1 tablet Oral Q6H PRN Judd Lien, MD   1 tablet at 03/30/21 1303   phenol (CHLORASEPTIC) mouth spray 1 spray  1 spray Mouth/Throat PRN Simonne Maffucci B, MD       polyethylene glycol (MIRALAX / GLYCOLAX) packet 17 g  17 g Oral Daily PRN Ollis, Brandi L, NP       polyethylene glycol (MIRALAX / GLYCOLAX) packet 17 g  17 g Oral Daily Marianna Payment, MD   17 g at 03/29/21 [id number]   polyvinyl alcohol (LIQUIFILM TEARS) 1.4 % ophthalmic solution 1 drop  1 drop Both Eyes TID Marianna Payment, MD   1 drop at 03/30/21 1549   predniSONE (DELTASONE) tablet 50 mg  50 mg Oral Daily Henri Medal, RPH   50 mg at 03/30/21 1131   pregabalin (LYRICA) capsule 25 mg  25 mg Oral BID Marianna Payment, MD   25 mg at 03/30/21 1132   senna (SENOKOT) tablet 8.6 mg  1 tablet Oral QHS Marianna Payment, MD   8.6 mg at 03/29/21 2047   sertraline (ZOLOFT) tablet 25 mg  25 mg Oral Daily Marianna Payment, MD   25 mg at 03/30/21 1130     Discharge Medications: Please see discharge summary for a list of discharge medications.  Relevant Imaging Results:  Relevant Lab Results:   Additional Information SSN: [id number]. No immunizations on file  Benard Halsted, LCSW

## 2021-03-30 NOTE — Progress Notes (Signed)
Dialysis nurse [facility] came to speak with patient that he was on dialysis list for tonight. Pt refused with [facility]. This writer attempted to speak with pt and see if she changed his mind. Pt still refused dialysis treatment. [facility]as RN aware

## 2021-03-30 NOTE — Plan of Care (Signed)

## 2021-03-31 DIAGNOSIS — J9601 Acute respiratory failure with hypoxia: Secondary | ICD-10-CM | POA: Diagnosis not present

## 2021-03-31 LAB — CBC
HCT: 31.2 % — ABNORMAL LOW (ref 39.0–52.0)
Hemoglobin: 10.5 g/dL — ABNORMAL LOW (ref 13.0–17.0)
MCH: 27.9 pg (ref 26.0–34.0)
MCHC: 33.7 g/dL (ref 30.0–36.0)
MCV: 83 fL (ref 80.0–100.0)
Platelets: 158 10*3/uL (ref 150–400)
RBC: 3.76 MIL/uL — ABNORMAL LOW (ref 4.22–5.81)
RDW: 15 % (ref 11.5–15.5)
WBC: 17.7 10*3/uL — ABNORMAL HIGH (ref 4.0–10.5)
nRBC: 0 % (ref 0.0–0.2)

## 2021-03-31 LAB — FERRITIN: Ferritin: 2369 ng/mL — ABNORMAL HIGH (ref 24–336)

## 2021-03-31 LAB — CBC WITH DIFFERENTIAL/PLATELET
Abs Immature Granulocytes: 0.12 10*3/uL — ABNORMAL HIGH (ref 0.00–0.07)
Basophils Absolute: 0 10*3/uL (ref 0.0–0.1)
Basophils Relative: 0 %
Eosinophils Absolute: 0 10*3/uL (ref 0.0–0.5)
Eosinophils Relative: 0 %
HCT: 33 % — ABNORMAL LOW (ref 39.0–52.0)
Hemoglobin: 11 g/dL — ABNORMAL LOW (ref 13.0–17.0)
Immature Granulocytes: 1 %
Lymphocytes Relative: 6 %
Lymphs Abs: 0.8 10*3/uL (ref 0.7–4.0)
MCH: 28.1 pg (ref 26.0–34.0)
MCHC: 33.3 g/dL (ref 30.0–36.0)
MCV: 84.2 fL (ref 80.0–100.0)
Monocytes Absolute: 0.4 10*3/uL (ref 0.1–1.0)
Monocytes Relative: 3 %
Neutro Abs: 12 10*3/uL — ABNORMAL HIGH (ref 1.7–7.7)
Neutrophils Relative %: 90 %
Platelets: 81 10*3/uL — ABNORMAL LOW (ref 150–400)
RBC: 3.92 MIL/uL — ABNORMAL LOW (ref 4.22–5.81)
RDW: 15.2 % (ref 11.5–15.5)
WBC: 13.3 10*3/uL — ABNORMAL HIGH (ref 4.0–10.5)
nRBC: 0.2 % (ref 0.0–0.2)

## 2021-03-31 LAB — RENAL FUNCTION PANEL
Albumin: 2.9 g/dL — ABNORMAL LOW (ref 3.5–5.0)
Anion gap: 14 (ref 5–15)
BUN: 91 mg/dL — ABNORMAL HIGH (ref 6–20)
CO2: 23 mmol/L (ref 22–32)
Calcium: 8.6 mg/dL — ABNORMAL LOW (ref 8.9–10.3)
Chloride: 89 mmol/L — ABNORMAL LOW (ref 98–111)
Creatinine, Ser: 5.91 mg/dL — ABNORMAL HIGH (ref 0.61–1.24)
GFR, Estimated: 11 mL/min — ABNORMAL LOW (ref >60)
Glucose, Bld: 166 mg/dL — ABNORMAL HIGH (ref 70–99)
Phosphorus: 2.2 mg/dL — ABNORMAL LOW (ref 2.5–4.6)
Potassium: 4.6 mmol/L (ref 3.5–5.1)
Sodium: 126 mmol/L — ABNORMAL LOW (ref 135–145)

## 2021-03-31 LAB — PHOSPHORUS: Phosphorus: 2.3 mg/dL — ABNORMAL LOW (ref 2.5–4.6)

## 2021-03-31 LAB — D-DIMER, QUANTITATIVE: D-Dimer, Quant: 5.47 ug/mL-FEU — ABNORMAL HIGH (ref 0.00–0.50)

## 2021-03-31 LAB — COMPREHENSIVE METABOLIC PANEL
ALT: 25 U/L (ref 0–44)
AST: 28 U/L (ref 15–41)
Albumin: 3 g/dL — ABNORMAL LOW (ref 3.5–5.0)
Alkaline Phosphatase: 87 U/L (ref 38–126)
Anion gap: 14 (ref 5–15)
BUN: 85 mg/dL — ABNORMAL HIGH (ref 6–20)
CO2: 22 mmol/L (ref 22–32)
Calcium: 8.5 mg/dL — ABNORMAL LOW (ref 8.9–10.3)
Chloride: 91 mmol/L — ABNORMAL LOW (ref 98–111)
Creatinine, Ser: 5.73 mg/dL — ABNORMAL HIGH (ref 0.61–1.24)
GFR, Estimated: 11 mL/min — ABNORMAL LOW (ref >60)
Glucose, Bld: 283 mg/dL — ABNORMAL HIGH (ref 70–99)
Potassium: 4.2 mmol/L (ref 3.5–5.1)
Sodium: 127 mmol/L — ABNORMAL LOW (ref 135–145)
Total Bilirubin: 0.6 mg/dL (ref 0.3–1.2)
Total Protein: 7 g/dL (ref 6.5–8.1)

## 2021-03-31 LAB — C-REACTIVE PROTEIN: CRP: 1.9 mg/dL — ABNORMAL HIGH (ref <1.0)

## 2021-03-31 LAB — GLUCOSE, CAPILLARY
Glucose-Capillary: 110 mg/dL — ABNORMAL HIGH (ref 70–99)
Glucose-Capillary: 171 mg/dL — ABNORMAL HIGH (ref 70–99)
Glucose-Capillary: 211 mg/dL — ABNORMAL HIGH (ref 70–99)
Glucose-Capillary: 318 mg/dL — ABNORMAL HIGH (ref 70–99)
Glucose-Capillary: 380 mg/dL — ABNORMAL HIGH (ref 70–99)

## 2021-03-31 LAB — HEPATITIS B SURFACE ANTIBODY,QUALITATIVE: Hep B S Ab: REACTIVE — AB

## 2021-03-31 LAB — MAGNESIUM: Magnesium: 2.6 mg/dL — ABNORMAL HIGH (ref 1.7–2.4)

## 2021-03-31 MED ORDER — SODIUM CHLORIDE 0.9 % IV SOLN: 100.0000 mL | INTRAVENOUS | Status: DC | PRN

## 2021-03-31 MED ORDER — PENTAFLUOROPROP-TETRAFLUOROETH EX AERO: 1.0000 "application " | INHALATION_SPRAY | CUTANEOUS | Status: DC | PRN

## 2021-03-31 MED ORDER — ALTEPLASE 2 MG IJ SOLR: 2.0000 mg | Freq: Once | INTRAMUSCULAR | Status: DC | PRN

## 2021-03-31 MED ORDER — HEPARIN SODIUM (PORCINE) 1000 UNIT/ML DIALYSIS: 1000.0000 [IU] | INTRAMUSCULAR | Status: DC | PRN

## 2021-03-31 MED ORDER — LIDOCAINE HCL (PF) 1 % IJ SOLN: 5.0000 mL | INTRAMUSCULAR | Status: DC | PRN

## 2021-03-31 MED ORDER — LIDOCAINE-PRILOCAINE 2.5-2.5 % EX CREA: 1.0000 "application " | TOPICAL_CREAM | CUTANEOUS | Status: DC | PRN

## 2021-03-31 NOTE — Plan of Care (Signed)

## 2021-03-31 NOTE — Progress Notes (Signed)
[facility] Progress Note    Assessment/ Plan:   1) ESRD:  Outpatient orders: [facility], MWF, 4 hrs, F180, 2k, 2cal, edw 53kg, no heparin -will cont w/ MWF schedule next week as he refused HD Friday; Net UF 4L on 10/12  Major issues with staffing today but counseled pt this am as he has really put the unit in a difficult spot by refusing HD on 10/14. CANNOT miss hd today if they come to transport him. He stated understanding    2)  AHRF COVID positive 03/24/2021 Pulmonary edema, left pleural effusion - completed 4d course of remdesivir per primary service, on pred -thora 10/10: 1200cc drained  3)  Hyponatremia -likely hypervolemic, managing with HD: 137 Na bath, UF as tolerated. Improving   4)  Volume/ hypertension: EDW 53kg. UF as tolerated   5)  Anemia of Chronic Kidney Disease: Hemoglobin 11 Receiving Mircera 59mg qtreatment, last dose 10/3 as an outpatient. Hold for now   6) Secondary Hyperparathyroidism/Hyperphosphatemia: resume home binders. Phos/cal okay (phos 3.5 10/14)   7)  Vascular access: RUE BBT   8) Additional recommendations: - Dose all meds for creatinine clearance < 10 ml/min  - Unless absolutely necessary, no MRIs with gadolinium.  -  No blood pressure measurements in rt arm. - If blood transfusion is requested during hemodialysis sessions, please alert [facility] to the session.   Subjective:   No acute events, denies dyspneic, cough still present but a little better. Unfortunately he refused HD yest when there was more staffing  On 20% HF     Objective:   BP (!) 157/90 (BP Location: Left Arm)   Pulse 81   Temp 97.7 F (36.5 C) (Oral)   Resp 20   Wt 53.7 kg   SpO2 93%   BMI 20.97 kg/m   Intake/Output Summary (Last 24 hours) at 03/31/2021 0750 Last data filed at 03/31/2021 0300 Gross per 24 hour  Intake 830 ml  Output 550 ml  Net 280 ml   Weight change:   Physical Exam: GEN: NAD, A&Ox3, NCAT HEENT: No conjunctival  pallor, blind NECK: Supple, no thyromegaly LUNGS: Rhonchi present, not tachypneic CV: RRR, No M/R/G ABD: SNDNT +BS  EXT: No lower extremity edema ACCESS: Rt BBT pulsatile   Imaging: CT Angio Chest Pulmonary Embolism (PE) W or WO Contrast  Result Date: 03/30/2021 CLINICAL DATA:  PE suspected, COVID positive, cough, congestion EXAM: CT ANGIOGRAPHY CHEST WITH CONTRAST TECHNIQUE: Multidetector CT imaging of the chest was performed using the standard protocol during bolus administration of intravenous contrast. Multiplanar CT image reconstructions and MIPs were obtained to evaluate the vascular anatomy. CONTRAST:  623mOMNIPAQUE IOHEXOL 350 MG/ML SOLN COMPARISON:  None. FINDINGS: Cardiovascular: Satisfactory opacification of the pulmonary arteries to the segmental level. No evidence of pulmonary embolism. Normal heart size. Left coronary artery calcifications. No pericardial effusion. Scattered aortic atherosclerosis. Mediastinum/Nodes: No enlarged mediastinal, hilar, or axillary lymph nodes. Thyroid gland, trachea, and esophagus demonstrate no significant findings. Lungs/Pleura: Moderate bilateral pleural effusions and associated atelectasis or consolidation. Extensive ground-glass airspace opacity throughout the aerated portions of the lungs. Upper Abdomen: No acute abnormality. Musculoskeletal: No chest wall abnormality. No acute or significant osseous findings. Review of the MIP images confirms the above findings. IMPRESSION: 1. Negative examination for pulmonary embolism. 2. Moderate bilateral pleural effusions and associated atelectasis or consolidation. 3. Extensive ground-glass airspace opacity throughout the aerated portions of the lungs, consistent with multifocal infection and reported COVID-19 airspace disease. 4. Coronary artery disease. Aortic  Atherosclerosis (ICD10-I70.0). Electronically Signed   By: Delanna Ahmadi M.D.   On: 03/30/2021 10:01    Labs: BMET Recent Labs  Lab 03/26/21 0553  03/26/21 0609 03/27/21 0645 03/28/21 0514 03/29/21 0638 03/30/21 0128 03/31/21 0236  NA 136 135 134* 129* 133* 131* 127*  K 5.2* 5.2* 4.3 4.7 3.6 3.9 4.2  CL 95* 101 93* 91* 96* 94* 91*  CO2 24  --  '26 25 26 26 22  '$ GLUCOSE 122* 120* 119* 93 112* 230* 283*  BUN 45* 43* 26* 54* 37* 55* 85*  CREATININE 6.10* 6.10* 3.66* 5.02* 3.79* 4.78* 5.73*  CALCIUM 9.3  --  8.5* 8.6* 8.3* 8.3* 8.5*  PHOS  --   --  4.4 5.8* 3.5 2.8 2.3*   CBC Recent Labs  Lab 03/28/21 0514 03/29/21 0638 03/30/21 0128 03/31/21 0236  WBC 16.2* 10.8* 12.1* 13.3*  NEUTROABS 14.1* 8.7* 10.6* 12.0*  HGB 11.4* 11.0* 10.3* 11.0*  HCT 35.4* 33.3* 31.7* 33.0*  MCV 86.1 84.7 84.8 84.2  PLT 163 131* 145* 81*    Medications:     acetaminophen  1,000 mg Oral Once   amLODipine  10 mg Oral Daily   aspirin  81 mg Oral Daily   atorvastatin  40 mg Oral Daily   brimonidine  1 drop Left Eye BID   calcium acetate  667 mg Oral TID WC   Chlorhexidine Gluconate Cloth  6 each Topical Q0600   docusate sodium  100 mg Oral BID   dorzolamide-timolol  1 drop Left Eye BID   heparin injection (subcutaneous)  5,000 Units Subcutaneous Q8H   hydrALAZINE  25 mg Oral Q8H   insulin aspart  0-6 Units Subcutaneous Q4H   latanoprost  1 drop Left Eye QHS   mupirocin ointment   Nasal BID   olopatadine  2 drop Both Eyes BID   polyethylene glycol  17 g Oral Daily   polyvinyl alcohol  1 drop Both Eyes TID   predniSONE  50 mg Oral Daily   pregabalin  25 mg Oral BID   senna  1 tablet Oral QHS   sertraline  25 mg Oral Daily

## 2021-03-31 NOTE — Progress Notes (Signed)
PROGRESS NOTE    Christopher Davila  [id number] DOB: May 29, 1971 DOA: 03/26/2021 PCP: [name], MD   Brief Narrative:  50 y/o M who presented to [facility] on 10/10 with reports of shortness of breath.    The patient lives in a facility and is baseline 3L O2 dependent.  He is HD dependent M/W/F.  The facility [facility]) called EMS for shortness of breath. He normally wears 3L oxygen, oxygen sats were in the 60's and was increased to 5L at the facility without improvement. EMS was called and confirmed hypoxia.  The patient was COVID positive 2 days prior to admit (10/8) at the facility.  On arrival to the ER, he failed salter O2 and was placed on CPAP without improvement.  He ultimately was placed on BiPAP with improvement in saturations and work of breathing.  His last HD session was on Friday 10/7.  EKG showed ST.  He was hypertensive and exam suggestive of volume overload.  CXR showed chronic left pleural effusion.  The patient pulled his biPAP off and desaturated into the 50's.  He was treated empirically with rocephin.  COVID status was confirmed positive, flu negative. Initial labs showed K 5.2, glucose 120, BUN 43, sr cr 6.10, AG 17, troponin 29, WBC 11.4, Hgb 11.7 and platelets 165.     PCCM called for ICU admission.  Transferred under [facility] on 03/29/2021.  Assessment & Plan:   Active Problems:   Acute respiratory failure with hypoxia (HCC)   History of thoracentesis   S/P thoracentesis   Acute on chronic hypoxic Respiratory Failure/COVID-19 pneumonitis/pulmonary edema/fluid overload: Multifactorial,  still on 20 L of high flow oxygen but despite of that, he denies any shortness of breath and looks very comfortable without any dyspnea or tachypnea..  Continue dialysis to remove fluid.  Completed 5/5 days of remdesivir on 03/31/2019.  Continue prednisone.  Inflammatory markers improving.  Continue to wean oxygen as able to.  Patient was encouraged to prone, out of bed to chair, to use  incentive spirometry and flutter valve.  Significantly elevated D-dimer, CT angiogram negative for PE.  Procalcitonin was elevated 4 days ago, much higher on repeat labs on 03/30/2021.  Added Rocephin and Zithromax due to suspected superimposed bacterial pneumonia.    Essential hypertension: Continue amlodipine and hydralazine, blood pressure controlled.  Beta-blockers on hold.  ESRD on HD: Patient refused dialysis yesterday and claims that there was some confusion.  He is agreeable today.  Appreciate nephrology help.  HLD: Continue atorvastatin.   DM type II: Continue SSI.  Blood sugar controlled.   Anemia of chronic disease: Stable.  DVT prophylaxis: heparin injection 5,000 Units Start: 03/26/21 2200 SCDs Start: 03/26/21 [id number]   Code Status: Full Code  Family Communication:  None present at bedside.  Plan of care discussed with patient in length and he verbalized understanding and agreed with it.  Status is: Inpatient  Remains inpatient appropriate because:Inpatient level of care appropriate due to severity of illness  Dispo: The patient is from: SNF              Anticipated d/c is to: SNF              Patient currently is not medically stable to d/c.   Difficult to place patient No   Estimated body mass index is 20.97 kg/m as calculated from the following:   Height as of 03/16/21: '5\' 3"'$  (1.6 m).   Weight as of this encounter: 53.7 kg.  Nutritional Assessment: Body mass index is 20.97 kg/m.Marland Kitchen Seen by dietician.  I agree with the assessment and plan as outlined below: Nutrition Status:        .  Skin Assessment: I have examined the patient's skin and I agree with the wound assessment as performed by the wound care RN as outlined below:    Consultants:  None  Procedures:  None  Antimicrobials:  Anti-infectives (From admission, onward)    Start     Dose/Rate Route Frequency Ordered Stop   03/30/21 1400  cefTRIAXone (ROCEPHIN) 2 g in sodium chloride 0.9 % 100  mL IVPB        2 g 200 mL/hr over 30 Minutes Intravenous Every 24 hours 03/30/21 1233     03/30/21 1400  azithromycin (ZITHROMAX) 500 mg in sodium chloride 0.9 % 250 mL IVPB        500 mg 250 mL/hr over 60 Minutes Intravenous Every 24 hours 03/30/21 1233     03/27/21 1000  remdesivir 100 mg in sodium chloride 0.9 % 100 mL IVPB       See Hyperspace for full Linked Orders Report.   100 mg 200 mL/hr over 30 Minutes Intravenous Daily 03/26/21 0921 03/31/21 0739   03/26/21 0930  remdesivir 200 mg in sodium chloride 0.9% 250 mL IVPB       See Hyperspace for full Linked Orders Report.   200 mg 580 mL/hr over 30 Minutes Intravenous Once 03/26/21 0921 03/26/21 1528   03/26/21 0715  cefTRIAXone (ROCEPHIN) 1 g in sodium chloride 0.9 % 100 mL IVPB        1 g 200 mL/hr over 30 Minutes Intravenous  Once 03/26/21 0703 03/26/21 0933          Subjective: Patient seen and examined.  Patient blind.  He has no complaints.  Denies any shortness of breath.  Objective: Vitals:   03/31/21 0345 03/31/21 0400 03/31/21 0536 03/31/21 0737  BP:  (!) 154/86 (!) 172/99 (!) 157/90  Pulse: 75 70 75 81  Resp: '12 20 18 20  '$ Temp:    97.7 F (36.5 C)  TempSrc:    Oral  SpO2: 93% 92% 93% 93%  Weight:        Intake/Output Summary (Last 24 hours) at 03/31/2021 1120 Last data filed at 03/31/2021 0805 Gross per 24 hour  Intake 1070 ml  Output 350 ml  Net 720 ml    Filed Weights   03/27/21 0051 03/27/21 0500  Weight: 55.8 kg 53.7 kg    Examination:  General exam: Appears calm and comfortable  Respiratory system: Minich breath sounds bilaterally. Respiratory effort normal. Cardiovascular system: S1 & S2 heard, RRR. No JVD, murmurs, rubs, gallops or clicks. No pedal edema. Gastrointestinal system: Abdomen is nondistended, soft and nontender. No organomegaly or masses felt. Normal bowel sounds heard. Central nervous system: Alert and oriented. No focal neurological deficits. Extremities: Symmetric 5 x 5  power. Skin: No rashes, lesions or ulcers.  Psychiatry: Judgement and insight appear normal. Mood & affect appropriate.    Data Reviewed: I have personally reviewed following labs and imaging studies  CBC: Recent Labs  Lab 03/27/21 0645 03/28/21 0514 03/29/21 0638 03/30/21 0128 03/31/21 0236  WBC 10.5 16.2* 10.8* 12.1* 13.3*  NEUTROABS 9.5* 14.1* 8.7* 10.6* 12.0*  HGB 12.2* 11.4* 11.0* 10.3* 11.0*  HCT 38.4* 35.4* 33.3* 31.7* 33.0*  MCV 87.5 86.1 84.7 84.8 84.2  PLT 145* 163 131* 145* 81*    Basic Metabolic Panel: Recent Labs  Lab 03/27/21 0645 03/28/21 0514 03/29/21 0638 03/30/21 0128 03/31/21 0236  NA 134* 129* 133* 131* 127*  K 4.3 4.7 3.6 3.9 4.2  CL 93* 91* 96* 94* 91*  CO2 '26 25 26 26 22  '$ GLUCOSE 119* 93 112* 230* 283*  BUN 26* 54* 37* 55* 85*  CREATININE 3.66* 5.02* 3.79* 4.78* 5.73*  CALCIUM 8.5* 8.6* 8.3* 8.3* 8.5*  MG 2.1 2.4 2.2 2.3 2.6*  PHOS 4.4 5.8* 3.5 2.8 2.3*    GFR: Estimated Creatinine Clearance: 11.8 mL/min (A) (by C-G formula based on SCr of 5.73 mg/dL (H)). Liver Function Tests: Recent Labs  Lab 03/27/21 0645 03/28/21 0514 03/29/21 0638 03/30/21 0128 03/31/21 0236  AST 34 '30 30 24 28  '$ ALT '15 12 14 13 25  '$ ALKPHOS 77 78 66 70 87  BILITOT 0.6 1.0 0.6 0.8 0.6  PROT 8.5* 7.3 6.2* 6.3* 7.0  ALBUMIN 3.5 3.1* 2.6* 2.7* 3.0*    No results for input(s): LIPASE, AMYLASE in the last 168 hours. No results for input(s): AMMONIA in the last 168 hours. Coagulation Profile: No results for input(s): INR, PROTIME in the last 168 hours. Cardiac Enzymes: No results for input(s): CKTOTAL, CKMB, CKMBINDEX, TROPONINI in the last 168 hours. BNP (last 3 results) No results for input(s): PROBNP in the last 8760 hours. HbA1C: No results for input(s): HGBA1C in the last 72 hours. CBG: Recent Labs  Lab 03/30/21 1633 03/30/21 1934 03/31/21 0005 03/31/21 0343 03/31/21 0736  GLUCAP 233* 331* 380* 211* 110*    Lipid Profile: No results for  input(s): CHOL, HDL, LDLCALC, TRIG, CHOLHDL, LDLDIRECT in the last 72 hours. Thyroid Function Tests: No results for input(s): TSH, T4TOTAL, FREET4, T3FREE, THYROIDAB in the last 72 hours. Anemia Panel: Recent Labs    03/30/21 0128 03/31/21 0236  FERRITIN 2,480* 2,369*    Sepsis Labs: Recent Labs  Lab 03/26/21 0906 03/30/21 0128  PROCALCITON 0.86 2.07     Recent Results (from the past 240 hour(s))  Resp Panel by RT-PCR (Flu A&B, Covid) Nasopharyngeal Swab     Status: Abnormal   Collection Time: 03/26/21  7:17 AM   Specimen: Nasopharyngeal Swab; Nasopharyngeal(NP) swabs in vial transport medium  Result Value Ref Range Status   SARS Coronavirus 2 by RT PCR POSITIVE (A) NEGATIVE Final    Comment: RESULT CALLED TO, READ BACK BY AND VERIFIED WITH: C LOZER RN 03/26/21 0904 JDW (NOTE) SARS-CoV-2 target nucleic acids are DETECTED.  The SARS-CoV-2 RNA is generally detectable in upper respiratory specimens during the acute phase of infection. Positive results are indicative of the presence of the identified virus, but do not rule out bacterial infection or co-infection with other pathogens not detected by the test. Clinical correlation with patient history and other diagnostic information is necessary to determine patient infection status. The expected result is Negative.  Fact Sheet for Patients: EntrepreneurPulse.com.au  Fact Sheet for Healthcare Providers: IncredibleEmployment.be  This test is not yet approved or cleared by the [place] FDA and  has been authorized for detection and/or diagnosis of SARS-CoV-2 by FDA under an Emergency Use Authorization (EUA).  This EUA will remain in effect (meaning this test can be used ) for the duration of  the COVID-19 declaration under Section 564(b)(1) of the Act, 21 U.S.C. section 360bbb-3(b)(1), unless the authorization is terminated or revoked sooner.     Influenza A by PCR NEGATIVE  NEGATIVE Final   Influenza B by PCR NEGATIVE NEGATIVE Final    Comment: (NOTE) The Xpert Xpress  SARS-CoV-2/FLU/RSV plus assay is intended as an aid in the diagnosis of influenza from Nasopharyngeal swab specimens and should not be used as a sole basis for treatment. Nasal washings and aspirates are unacceptable for Xpert Xpress SARS-CoV-2/FLU/RSV testing.  Fact Sheet for Patients: EntrepreneurPulse.com.au  Fact Sheet for Healthcare Providers: IncredibleEmployment.be  This test is not yet approved or cleared by the [place] FDA and has been authorized for detection and/or diagnosis of SARS-CoV-2 by FDA under an Emergency Use Authorization (EUA). This EUA will remain in effect (meaning this test can be used) for the duration of the COVID-19 declaration under Section 564(b)(1) of the Act, 21 U.S.C. section 360bbb-3(b)(1), unless the authorization is terminated or revoked.  Performed at [facility], [place] [address] [place], [place] [address]   MRSA Next Gen by PCR, Nasal     Status: Abnormal   Collection Time: 03/26/21  2:33 PM   Specimen: Nasal Mucosa; Nasal Swab  Result Value Ref Range Status   MRSA by PCR Next Gen DETECTED (A) NOT DETECTED Final    Comment: RESULT CALLED TO, READ BACK BY AND VERIFIED WITH: JAMIE COLE RN 03/26/21 '@1738'$  BY JW  (NOTE) The GeneXpert MRSA Assay (FDA approved for NASAL specimens only), is one component of a comprehensive MRSA colonization surveillance program. It is not intended to diagnose MRSA infection nor to guide or monitor treatment for MRSA infections. Test performance is not FDA approved in patients less than 38 years old. Performed at [facility], [place] [address], [place], [place] [address]        Radiology Studies: CT Angio Chest Pulmonary Embolism (PE) W or WO Contrast  Result Date: 03/30/2021 CLINICAL DATA:  PE suspected, COVID positive, cough, congestion EXAM: CT ANGIOGRAPHY  CHEST WITH CONTRAST TECHNIQUE: Multidetector CT imaging of the chest was performed using the standard protocol during bolus administration of intravenous contrast. Multiplanar CT image reconstructions and MIPs were obtained to evaluate the vascular anatomy. CONTRAST:  25m OMNIPAQUE IOHEXOL 350 MG/ML SOLN COMPARISON:  None. FINDINGS: Cardiovascular: Satisfactory opacification of the pulmonary arteries to the segmental level. No evidence of pulmonary embolism. Normal heart size. Left coronary artery calcifications. No pericardial effusion. Scattered aortic atherosclerosis. Mediastinum/Nodes: No enlarged mediastinal, hilar, or axillary lymph nodes. Thyroid gland, trachea, and esophagus demonstrate no significant findings. Lungs/Pleura: Moderate bilateral pleural effusions and associated atelectasis or consolidation. Extensive ground-glass airspace opacity throughout the aerated portions of the lungs. Upper Abdomen: No acute abnormality. Musculoskeletal: No chest wall abnormality. No acute or significant osseous findings. Review of the MIP images confirms the above findings. IMPRESSION: 1. Negative examination for pulmonary embolism. 2. Moderate bilateral pleural effusions and associated atelectasis or consolidation. 3. Extensive ground-glass airspace opacity throughout the aerated portions of the lungs, consistent with multifocal infection and reported COVID-19 airspace disease. 4. Coronary artery disease. Aortic Atherosclerosis (ICD10-I70.0). Electronically Signed   By: ADelanna AhmadiM.D.   On: 03/30/2021 10:01    Scheduled Meds:  acetaminophen  1,000 mg Oral Once   amLODipine  10 mg Oral Daily   aspirin  81 mg Oral Daily   atorvastatin  40 mg Oral Daily   brimonidine  1 drop Left Eye BID   calcium acetate  667 mg Oral TID WC   Chlorhexidine Gluconate Cloth  6 each Topical Q0600   docusate sodium  100 mg Oral BID   dorzolamide-timolol  1 drop Left Eye BID   heparin injection (subcutaneous)  5,000 Units  Subcutaneous Q8H   hydrALAZINE  25  mg Oral Q8H   insulin aspart  0-6 Units Subcutaneous Q4H   latanoprost  1 drop Left Eye QHS   mupirocin ointment   Nasal BID   olopatadine  2 drop Both Eyes BID   polyethylene glycol  17 g Oral Daily   polyvinyl alcohol  1 drop Both Eyes TID   predniSONE  50 mg Oral Daily   pregabalin  25 mg Oral BID   senna  1 tablet Oral QHS   sertraline  25 mg Oral Daily   Continuous Infusions:  sodium chloride     sodium chloride     azithromycin 500 mg (03/30/21 1549)   cefTRIAXone (ROCEPHIN)  IV 2 g (03/30/21 1450)      LOS: 5 days   Time spent: 28 minutes   Darliss Cheney, MD [facility]  03/31/2021, 11:20 AM  Please page via [facility] and do not message via secure chat for anything urgent. Secure chat can be used for anything non urgent.  How to contact the [facility] Attending or Consulting provider [place] or covering provider during after hours [place], for this patient?  Check the care team in [facility] and look for a) attending/consulting [facility] provider listed and b) the [facility] team listed. Page or secure chat [phone]. Log into [email or web address] and use [place] universal password to access. If you do not have the password, please contact the hospital operator. Locate the [facility] provider you are looking for under [facility] and page to a number that you can be directly reached. If you still have difficulty reaching the provider, please page the [facility] (Director on Call) for the Hospitalists listed on amion for assistance.

## 2021-04-01 DIAGNOSIS — J9601 Acute respiratory failure with hypoxia: Secondary | ICD-10-CM | POA: Diagnosis not present

## 2021-04-01 LAB — GLUCOSE, CAPILLARY
Glucose-Capillary: 114 mg/dL — ABNORMAL HIGH (ref 70–99)
Glucose-Capillary: 139 mg/dL — ABNORMAL HIGH (ref 70–99)
Glucose-Capillary: 184 mg/dL — ABNORMAL HIGH (ref 70–99)
Glucose-Capillary: 235 mg/dL — ABNORMAL HIGH (ref 70–99)
Glucose-Capillary: 240 mg/dL — ABNORMAL HIGH (ref 70–99)
Glucose-Capillary: 291 mg/dL — ABNORMAL HIGH (ref 70–99)
Glucose-Capillary: 299 mg/dL — ABNORMAL HIGH (ref 70–99)

## 2021-04-01 NOTE — Progress Notes (Signed)
PROGRESS NOTE    Christopher Davila  [id number] DOB: [birth year] DOA: 03/26/2021 PCP: [name], MD   Brief Narrative:  50 y/o M who presented to [facility] on 10/10 with reports of shortness of breath.    The patient lives in a facility and is baseline 3L O2 dependent.  He is HD dependent M/W/F.  The facility [facility]) called EMS for shortness of breath. He normally wears 3L oxygen, oxygen sats were in the 60's and was increased to 5L at the facility without improvement. EMS was called and confirmed hypoxia.  The patient was COVID positive 2 days prior to admit (10/8) at the facility.  On arrival to the ER, he failed salter O2 and was placed on CPAP without improvement.  He ultimately was placed on BiPAP with improvement in saturations and work of breathing.  His last HD session was on Friday 10/7.  EKG showed ST.  He was hypertensive and exam suggestive of volume overload.  CXR showed chronic left pleural effusion.  The patient pulled his biPAP off and desaturated into the 50's.  He was treated empirically with rocephin.  COVID status was confirmed positive, flu negative. Initial labs showed K 5.2, glucose 120, BUN 43, sr cr 6.10, AG 17, troponin 29, WBC 11.4, Hgb 11.7 and platelets 165.     PCCM called for ICU admission.  Transferred under [place] on 03/29/2021.  Assessment & Plan:   Active Problems:   Acute respiratory failure with hypoxia (HCC)   History of thoracentesis   S/P thoracentesis   Acute on chronic hypoxic Respiratory Failure/COVID-19 pneumonitis/pulmonary edema/fluid overload: Multifactorial,  still on 20 L of high flow oxygen but despite of that, he denies any shortness of breath and looks very comfortable without any dyspnea or tachypnea..  Continue dialysis to remove fluid.  Completed 5/5 days of remdesivir on 03/31/2019.  Continue prednisone.  Inflammatory markers improving.  Continue to wean oxygen as able to, perhaps needs some more hemodialysis as his session was cut  short even yesterday.  Patient was encouraged to prone, out of bed to chair, to use incentive spirometry and flutter valve.  Significantly elevated D-dimer, CT angiogram negative for PE.  Procalcitonin was elevated 4 days ago, much higher on repeat labs on 03/30/2021.  Continue Zithromax and Rocephin.  Despite of 20 L of oxygen usage, patient remains very comfortable and asymptomatic.  Essential hypertension: Continue amlodipine and hydralazine, blood pressure controlled.  Beta-blockers on hold.  ESRD on HD: Patient refused dialysis yesterday and claims that there was some confusion.  He is agreeable today.  Appreciate nephrology help.  HLD: Continue atorvastatin.   DM type II: Continue SSI.  Blood sugar controlled.   Anemia of chronic disease: Stable.  DVT prophylaxis: heparin injection 5,000 Units Start: 03/26/21 2200 SCDs Start: 03/26/21 [id number]   Code Status: Full Code  Family Communication:  None present at bedside.  Plan of care discussed with patient in length and he verbalized understanding and agreed with it.  Status is: Inpatient  Remains inpatient appropriate because:Inpatient level of care appropriate due to severity of illness  Dispo: The patient is from: SNF              Anticipated d/c is to: SNF              Patient currently is not medically stable to d/c.   Difficult to place patient No   Estimated body mass index is 21.95 kg/m as calculated from the following:   Height  as of 03/16/21: '5\' 3"'$  (1.6 m).   Weight as of this encounter: 56.2 kg.  Nutritional Assessment: Body mass index is 21.95 kg/m.Marland Kitchen Seen by dietician.  I agree with the assessment and plan as outlined below: Nutrition Status:  Skin Assessment: I have examined the patient's skin and I agree with the wound assessment as performed by the wound care RN as outlined below:    Consultants:  None  Procedures:  None  Antimicrobials:  Anti-infectives (From admission, onward)    Start     Dose/Rate  Route Frequency Ordered Stop   03/30/21 1400  cefTRIAXone (ROCEPHIN) 2 g in sodium chloride 0.9 % 100 mL IVPB        2 g 200 mL/hr over 30 Minutes Intravenous Every 24 hours 03/30/21 1233 04/04/21 1359   03/30/21 1400  azithromycin (ZITHROMAX) 500 mg in sodium chloride 0.9 % 250 mL IVPB        500 mg 250 mL/hr over 60 Minutes Intravenous Every 24 hours 03/30/21 1233 04/04/21 1359   03/27/21 1000  remdesivir 100 mg in sodium chloride 0.9 % 100 mL IVPB       See Hyperspace for full Linked Orders Report.   100 mg 200 mL/hr over 30 Minutes Intravenous Daily 03/26/21 0921 03/31/21 0739   03/26/21 0930  remdesivir 200 mg in sodium chloride 0.9% 250 mL IVPB       See Hyperspace for full Linked Orders Report.   200 mg 580 mL/hr over 30 Minutes Intravenous Once 03/26/21 0921 03/26/21 1528   03/26/21 0715  cefTRIAXone (ROCEPHIN) 1 g in sodium chloride 0.9 % 100 mL IVPB        1 g 200 mL/hr over 30 Minutes Intravenous  Once 03/26/21 0703 03/26/21 0933          Subjective: Patient seen and examined.  He has no complaints.  Objective: Vitals:   04/01/21 0725 04/01/21 0833 04/01/21 1201 04/01/21 1326  BP: (!) 169/89  (!) 154/88   Pulse: 83  87 81  Resp: '18 16 20 20  '$ Temp: 98.7 F (37.1 C)  98.4 F (36.9 C)   TempSrc: Oral  Oral   SpO2: 96%  91% 92%  Weight:        Intake/Output Summary (Last 24 hours) at 04/01/2021 1402 Last data filed at 04/01/2021 1342 Gross per 24 hour  Intake 240 ml  Output 2201 ml  Net -1961 ml    Filed Weights   03/31/21 1244 03/31/21 1609  Weight: 58.2 kg 56.2 kg    Examination:  General exam: Appears calm and comfortable, blind Respiratory system: Diminished breath sounds bilaterally, respiratory effort normal. Cardiovascular system: S1 & S2 heard, RRR. No JVD, murmurs, rubs, gallops or clicks. No pedal edema. Gastrointestinal system: Abdomen is nondistended, soft and nontender. No organomegaly or masses felt. Normal bowel sounds heard. Central  nervous system: Alert and oriented. No focal neurological deficits. Extremities: Symmetric 5 x 5 power. Skin: No rashes, lesions or ulcers.  Psychiatry: Judgement and insight appear normal. Mood & affect appropriate.   Data Reviewed: I have personally reviewed following labs and imaging studies  CBC: Recent Labs  Lab 03/27/21 0645 03/28/21 0514 03/29/21 0638 03/30/21 0128 03/31/21 0236 03/31/21 1112  WBC 10.5 16.2* 10.8* 12.1* 13.3* 17.7*  NEUTROABS 9.5* 14.1* 8.7* 10.6* 12.0*  --   HGB 12.2* 11.4* 11.0* 10.3* 11.0* 10.5*  HCT 38.4* 35.4* 33.3* 31.7* 33.0* 31.2*  MCV 87.5 86.1 84.7 84.8 84.2 83.0  PLT 145* 163 131*  145* 81* 0000000    Basic Metabolic Panel: Recent Labs  Lab 03/27/21 0645 03/28/21 0514 03/29/21 0638 03/30/21 0128 03/31/21 0236 03/31/21 1656  NA 134* 129* 133* 131* 127* 126*  K 4.3 4.7 3.6 3.9 4.2 4.6  CL 93* 91* 96* 94* 91* 89*  CO2 '26 25 26 26 22 23  '$ GLUCOSE 119* 93 112* 230* 283* 166*  BUN 26* 54* 37* 55* 85* 91*  CREATININE 3.66* 5.02* 3.79* 4.78* 5.73* 5.91*  CALCIUM 8.5* 8.6* 8.3* 8.3* 8.5* 8.6*  MG 2.1 2.4 2.2 2.3 2.6*  --   PHOS 4.4 5.8* 3.5 2.8 2.3* 2.2*    GFR: Estimated Creatinine Clearance: 12 mL/min (A) (by C-G formula based on SCr of 5.91 mg/dL (H)). Liver Function Tests: Recent Labs  Lab 03/27/21 0645 03/28/21 0514 03/29/21 0638 03/30/21 0128 03/31/21 0236 03/31/21 1656  AST 34 '30 30 24 28  '$ --   ALT '15 12 14 13 25  '$ --   ALKPHOS 77 78 66 70 87  --   BILITOT 0.6 1.0 0.6 0.8 0.6  --   PROT 8.5* 7.3 6.2* 6.3* 7.0  --   ALBUMIN 3.5 3.1* 2.6* 2.7* 3.0* 2.9*    No results for input(s): LIPASE, AMYLASE in the last 168 hours. No results for input(s): AMMONIA in the last 168 hours. Coagulation Profile: No results for input(s): INR, PROTIME in the last 168 hours. Cardiac Enzymes: No results for input(s): CKTOTAL, CKMB, CKMBINDEX, TROPONINI in the last 168 hours. BNP (last 3 results) No results for input(s): PROBNP in the last 8760  hours. HbA1C: No results for input(s): HGBA1C in the last 72 hours. CBG: Recent Labs  Lab 03/31/21 2007 03/31/21 2358 04/01/21 0424 04/01/21 0727 04/01/21 1203  GLUCAP 318* 240* 139* 114* 184*    Lipid Profile: No results for input(s): CHOL, HDL, LDLCALC, TRIG, CHOLHDL, LDLDIRECT in the last 72 hours. Thyroid Function Tests: No results for input(s): TSH, T4TOTAL, FREET4, T3FREE, THYROIDAB in the last 72 hours. Anemia Panel: Recent Labs    03/30/21 0128 03/31/21 0236  FERRITIN 2,480* 2,369*    Sepsis Labs: Recent Labs  Lab 03/26/21 0906 03/30/21 0128  PROCALCITON 0.86 2.07     Recent Results (from the past 240 hour(s))  Resp Panel by RT-PCR (Flu A&B, Covid) Nasopharyngeal Swab     Status: Abnormal   Collection Time: 03/26/21  7:17 AM   Specimen: Nasopharyngeal Swab; Nasopharyngeal(NP) swabs in vial transport medium  Result Value Ref Range Status   SARS Coronavirus 2 by RT PCR POSITIVE (A) NEGATIVE Final    Comment: RESULT CALLED TO, READ BACK BY AND VERIFIED WITH: C LOZER RN 03/26/21 0904 JDW (NOTE) SARS-CoV-2 target nucleic acids are DETECTED.  The SARS-CoV-2 RNA is generally detectable in upper respiratory specimens during the acute phase of infection. Positive results are indicative of the presence of the identified virus, but do not rule out bacterial infection or co-infection with other pathogens not detected by the test. Clinical correlation with patient history and other diagnostic information is necessary to determine patient infection status. The expected result is Negative.  Fact Sheet for Patients: EntrepreneurPulse.com.au  Fact Sheet for Healthcare Providers: IncredibleEmployment.be  This test is not yet approved or cleared by the [place] FDA and  has been authorized for detection and/or diagnosis of SARS-CoV-2 by FDA under an Emergency Use Authorization (EUA).  This EUA will remain in effect (meaning  this test can be used ) for the duration of  the COVID-19 declaration under Section  564(b)(1) of the Act, 21 U.S.C. section 360bbb-3(b)(1), unless the authorization is terminated or revoked sooner.     Influenza A by PCR NEGATIVE NEGATIVE Final   Influenza B by PCR NEGATIVE NEGATIVE Final    Comment: (NOTE) The Xpert Xpress SARS-CoV-2/FLU/RSV plus assay is intended as an aid in the diagnosis of influenza from Nasopharyngeal swab specimens and should not be used as a sole basis for treatment. Nasal washings and aspirates are unacceptable for Xpert Xpress SARS-CoV-2/FLU/RSV testing.  Fact Sheet for Patients: EntrepreneurPulse.com.au  Fact Sheet for Healthcare Providers: IncredibleEmployment.be  This test is not yet approved or cleared by the [place] FDA and has been authorized for detection and/or diagnosis of SARS-CoV-2 by FDA under an Emergency Use Authorization (EUA). This EUA will remain in effect (meaning this test can be used) for the duration of the COVID-19 declaration under Section 564(b)(1) of the Act, 21 U.S.C. section 360bbb-3(b)(1), unless the authorization is terminated or revoked.  Performed at [facility], [place] [address], [place], [place] [address]   MRSA Next Gen by PCR, Nasal     Status: Abnormal   Collection Time: 03/26/21  2:33 PM   Specimen: Nasal Mucosa; Nasal Swab  Result Value Ref Range Status   MRSA by PCR Next Gen DETECTED (A) NOT DETECTED Final    Comment: RESULT CALLED TO, READ BACK BY AND VERIFIED WITH: JAMIE COLE RN 03/26/21 '@1738'$  BY JW  (NOTE) The GeneXpert MRSA Assay (FDA approved for NASAL specimens only), is one component of a comprehensive MRSA colonization surveillance program. It is not intended to diagnose MRSA infection nor to guide or monitor treatment for MRSA infections. Test performance is not FDA approved in patients less than 38 years old. Performed at [facility], [place] [address], [place], [place] [address]        Radiology Studies: No results found.  Scheduled Meds:  acetaminophen  1,000 mg Oral Once   amLODipine  10 mg Oral Daily   aspirin  81 mg Oral Daily   atorvastatin  40 mg Oral Daily   brimonidine  1 drop Left Eye BID   calcium acetate  667 mg Oral TID WC   Chlorhexidine Gluconate Cloth  6 each Topical Q0600   docusate sodium  100 mg Oral BID   dorzolamide-timolol  1 drop Left Eye BID   heparin injection (subcutaneous)  5,000 Units Subcutaneous Q8H   hydrALAZINE  25 mg Oral Q8H   insulin aspart  0-6 Units Subcutaneous Q4H   latanoprost  1 drop Left Eye QHS   mupirocin ointment   Nasal BID   olopatadine  2 drop Both Eyes BID   polyethylene glycol  17 g Oral Daily   polyvinyl alcohol  1 drop Both Eyes TID   predniSONE  50 mg Oral Daily   pregabalin  25 mg Oral BID   senna  1 tablet Oral QHS   sertraline  25 mg Oral Daily   Continuous Infusions:  sodium chloride     sodium chloride     azithromycin 500 mg (03/31/21 1832)   cefTRIAXone (ROCEPHIN)  IV 2 g (03/31/21 1626)      LOS: 6 days   Time spent: 27 minutes   Darliss Cheney, MD [facility]  04/01/2021, 2:02 PM  Please page via [place] and do not message via secure chat for anything urgent. Secure chat can be used for anything non urgent.  How to contact the [facility] Attending or Consulting provider 7A -  7P or covering provider during after hours [place], for this patient?  Check the care team in [facility] and look for a) attending/consulting TR[facility] provider listed and b) the [facility] team listed. Page or secure chat 7A-7P. Log into [email or web address] and use [place] universal password to access. If you do not have the password, please contact the hospital operator. Locate the [facility] provider you are looking for under [facility] and page to a number that you can be directly reached. If you still have difficulty reaching the provider, please page the [facility] (Director on Call) for the  Hospitalists listed on amion for assistance.

## 2021-04-01 NOTE — Progress Notes (Signed)
[facility] Progress Note    Assessment/ Plan:   1) ESRD:  Outpatient orders: [facility], MWF, 4 hrs, F180, 2k, 2cal, edw 53kg, no heparin -will cont w/ MWF schedule next week as he refused HD Friday; Net UF 4L on 10/12  Tolerated HD 10/15 with 2L net UF; he feels poorly last hour and UF needs to be turned off. Will plan on HD Monday with UFP#2.    2)  AHRF COVID positive 03/24/2021 Pulmonary edema, left pleural effusion - completed 4d course of remdesivir per primary service, on pred -thora 10/10: 1200cc drained  3)  Hyponatremia -likely hypervolemic, managing with HD: 137 Na bath, UF as tolerated. Improving   4)  Volume/ hypertension: EDW 53kg. UF as tolerated   5)  Anemia of Chronic Kidney Disease: Hemoglobin 11 Receiving Mircera 60mg qtreatment, last dose 10/3 as an outpatient. Hold for now   6) Secondary Hyperparathyroidism/Hyperphosphatemia: resume home binders. Phos/cal okay (phos 3.5 10/14)   7)  Vascular access: RUE BBT   8) Additional recommendations: - Dose all meds for creatinine clearance < 10 ml/min  - Unless absolutely necessary, no MRIs with gadolinium.  -  No blood pressure measurements in rt arm. - If blood transfusion is requested during hemodialysis sessions, please alert uKoreaprior to the session.   Subjective:   No acute events, denies dyspneic, cough still present but a little better. Tolerated  HD yest but still felt poorly last hour and UF needed to be turned off.   On 20% HF and appears comfortable.    Objective:   BP (!) 160/85   Pulse 83   Temp (!) 97.5 F (36.4 C) (Oral)   Resp (!) 23   Wt 56.2 kg   SpO2 92%   BMI 21.95 kg/m   Intake/Output Summary (Last 24 hours) at 04/01/2021 0716 Last data filed at 04/01/2021 0630 Gross per 24 hour  Intake 480 ml  Output 2100 ml  Net -1620 ml   Weight change:   Physical Exam: GEN: NAD, A&Ox3, NCAT HEENT: No conjunctival pallor, blind NECK: Supple, no thyromegaly LUNGS:  Rhonchi present, not tachypneic CV: RRR, No M/R/G ABD: SNDNT +BS  EXT: No lower extremity edema ACCESS: Rt BBT pulsatile   Imaging: CT Angio Chest Pulmonary Embolism (PE) W or WO Contrast  Result Date: 03/30/2021 CLINICAL DATA:  PE suspected, COVID positive, cough, congestion EXAM: CT ANGIOGRAPHY CHEST WITH CONTRAST TECHNIQUE: Multidetector CT imaging of the chest was performed using the standard protocol during bolus administration of intravenous contrast. Multiplanar CT image reconstructions and MIPs were obtained to evaluate the vascular anatomy. CONTRAST:  654mOMNIPAQUE IOHEXOL 350 MG/ML SOLN COMPARISON:  None. FINDINGS: Cardiovascular: Satisfactory opacification of the pulmonary arteries to the segmental level. No evidence of pulmonary embolism. Normal heart size. Left coronary artery calcifications. No pericardial effusion. Scattered aortic atherosclerosis. Mediastinum/Nodes: No enlarged mediastinal, hilar, or axillary lymph nodes. Thyroid gland, trachea, and esophagus demonstrate no significant findings. Lungs/Pleura: Moderate bilateral pleural effusions and associated atelectasis or consolidation. Extensive ground-glass airspace opacity throughout the aerated portions of the lungs. Upper Abdomen: No acute abnormality. Musculoskeletal: No chest wall abnormality. No acute or significant osseous findings. Review of the MIP images confirms the above findings. IMPRESSION: 1. Negative examination for pulmonary embolism. 2. Moderate bilateral pleural effusions and associated atelectasis or consolidation. 3. Extensive ground-glass airspace opacity throughout the aerated portions of the lungs, consistent with multifocal infection and reported COVID-19 airspace disease. 4. Coronary artery disease. Aortic Atherosclerosis (ICD10-I70.0). Electronically Signed  By: Delanna Ahmadi M.D.   On: 03/30/2021 10:01    Labs: BMET Recent Labs  Lab 03/26/21 0553 03/26/21 0609 03/27/21 0645 03/28/21 0514  03/29/21 0638 03/30/21 0128 03/31/21 0236 03/31/21 1656  NA 136 135 134* 129* 133* 131* 127* 126*  K 5.2* 5.2* 4.3 4.7 3.6 3.9 4.2 4.6  CL 95* 101 93* 91* 96* 94* 91* 89*  CO2 24  --  '26 25 26 26 22 23  '$ GLUCOSE 122* 120* 119* 93 112* 230* 283* 166*  BUN 45* 43* 26* 54* 37* 55* 85* 91*  CREATININE 6.10* 6.10* 3.66* 5.02* 3.79* 4.78* 5.73* 5.91*  CALCIUM 9.3  --  8.5* 8.6* 8.3* 8.3* 8.5* 8.6*  PHOS  --   --  4.4 5.8* 3.5 2.8 2.3* 2.2*   CBC Recent Labs  Lab 03/28/21 0514 03/29/21 0638 03/30/21 0128 03/31/21 0236 03/31/21 1112  WBC 16.2* 10.8* 12.1* 13.3* 17.7*  NEUTROABS 14.1* 8.7* 10.6* 12.0*  --   HGB 11.4* 11.0* 10.3* 11.0* 10.5*  HCT 35.4* 33.3* 31.7* 33.0* 31.2*  MCV 86.1 84.7 84.8 84.2 83.0  PLT 163 131* 145* 81* 158    Medications:     acetaminophen  1,000 mg Oral Once   amLODipine  10 mg Oral Daily   aspirin  81 mg Oral Daily   atorvastatin  40 mg Oral Daily   brimonidine  1 drop Left Eye BID   calcium acetate  667 mg Oral TID WC   Chlorhexidine Gluconate Cloth  6 each Topical Q0600   docusate sodium  100 mg Oral BID   dorzolamide-timolol  1 drop Left Eye BID   heparin injection (subcutaneous)  5,000 Units Subcutaneous Q8H   hydrALAZINE  25 mg Oral Q8H   insulin aspart  0-6 Units Subcutaneous Q4H   latanoprost  1 drop Left Eye QHS   mupirocin ointment   Nasal BID   olopatadine  2 drop Both Eyes BID   polyethylene glycol  17 g Oral Daily   polyvinyl alcohol  1 drop Both Eyes TID   predniSONE  50 mg Oral Daily   pregabalin  25 mg Oral BID   senna  1 tablet Oral QHS   sertraline  25 mg Oral Daily

## 2021-04-01 NOTE — Plan of Care (Signed)
  Problem: Education: Goal: Knowledge of General Education information will improve Description Including pain rating scale, medication(s)/side effects and non-pharmacologic comfort measures Outcome: Progressing   Problem: Health Behavior/Discharge Planning: Goal: Ability to manage health-related needs will improve Outcome: Progressing   

## 2021-04-02 DIAGNOSIS — J9601 Acute respiratory failure with hypoxia: Secondary | ICD-10-CM | POA: Diagnosis not present

## 2021-04-02 LAB — GLUCOSE, CAPILLARY
Glucose-Capillary: 138 mg/dL — ABNORMAL HIGH (ref 70–99)
Glucose-Capillary: 151 mg/dL — ABNORMAL HIGH (ref 70–99)
Glucose-Capillary: 260 mg/dL — ABNORMAL HIGH (ref 70–99)
Glucose-Capillary: 268 mg/dL — ABNORMAL HIGH (ref 70–99)
Glucose-Capillary: 297 mg/dL — ABNORMAL HIGH (ref 70–99)
Glucose-Capillary: 309 mg/dL — ABNORMAL HIGH (ref 70–99)

## 2021-04-02 LAB — HEPATITIS B SURFACE ANTIBODY, QUANTITATIVE: Hep B S AB Quant (Post): 852.6 m[IU]/mL (ref >9.9)

## 2021-04-02 LAB — HEPATITIS B SURFACE ANTIGEN: Hepatitis B Surface Ag: NONREACTIVE

## 2021-04-02 MED ORDER — INSULIN GLARGINE-YFGN 100 UNIT/ML ~~LOC~~ SOLN
10.0000 [IU] | Freq: Every day | SUBCUTANEOUS | Status: DC
  Administered 2021-04-02: 10 [IU] via SUBCUTANEOUS
  Filled 2021-04-02 (×2): qty 0.1

## 2021-04-02 NOTE — Progress Notes (Signed)
PROGRESS NOTE    Christopher Davila  [id number] DOB: [birth year] DOA: 03/26/2021 PCP: [name], MD   Brief Narrative:  50 y/o M who presented to [facility] on 10/10 with reports of shortness of breath.    The patient lives in a facility and is baseline 3L O2 dependent.  He is HD dependent M/W/F.  The facility [facility]) called EMS for shortness of breath. He normally wears 3L oxygen, oxygen sats were in the 60's and was increased to 5L at the facility without improvement. EMS was called and confirmed hypoxia.  The patient was COVID positive 2 days prior to admit (10/8) at the facility.  On arrival to the ER, he failed salter O2 and was placed on CPAP without improvement.  He ultimately was placed on BiPAP with improvement in saturations and work of breathing.  His last HD session was on Friday 10/7.  EKG showed ST.  He was hypertensive and exam suggestive of volume overload.  CXR showed chronic left pleural effusion.  The patient pulled his biPAP off and desaturated into the 50's.  He was treated empirically with rocephin.  COVID status was confirmed positive, flu negative. Initial labs showed K 5.2, glucose 120, BUN 43, sr cr 6.10, AG 17, troponin 29, WBC 11.4, Hgb 11.7 and platelets 165.     PCCM called for ICU admission.  Transferred under [place] on 03/29/2021.  Assessment & Plan:   Active Problems:   Acute respiratory failure with hypoxia (HCC)   History of thoracentesis   S/P thoracentesis   Acute on chronic hypoxic Respiratory Failure/COVID-19 pneumonitis/pulmonary edema/fluid overload: Multifactorial,  still on 20 L of high flow oxygen but despite of that, he denies any shortness of breath and looks very comfortable without any dyspnea or tachypnea..  Continue dialysis to remove fluid.  Completed 5/5 days of remdesivir on 03/31/2019.  Continue prednisone.  Inflammatory markers improving.  Continue to wean oxygen as able to, perhaps needs some more hemodialysis as his session was cut  short even yesterday.  Patient was encouraged to prone, out of bed to chair, to use incentive spirometry and flutter valve.  Significantly elevated D-dimer, CT angiogram negative for PE.  Procalcitonin was elevated 4 days ago, much higher on repeat labs on 03/30/2021.  Continue Zithromax and Rocephin.  Nursing not having good luck with weaning him down, he is desaturating.  Essential hypertension: Continue amlodipine and hydralazine, blood pressure controlled.  Beta-blockers on hold.  ESRD on HD: Patient refused dialysis yesterday and claims that there was some confusion.  He is agreeable today.  Appreciate nephrology help.  HLD: Continue atorvastatin.   DM type II: Blood sugar 299 this morning.  Per nursing, somebody brought him Janine Limbo food yesterday.  I have started him on Lantus 10 units.  Continue SSI.   Anemia of chronic disease: Stable.  DVT prophylaxis: heparin injection 5,000 Units Start: 03/26/21 2200 SCDs Start: 03/26/21 [id number]   Code Status: Full Code  Family Communication:  None present at bedside.  Plan of care discussed with patient in length and he verbalized understanding and agreed with it.  Status is: Inpatient  Remains inpatient appropriate because:Inpatient level of care appropriate due to severity of illness  Dispo: The patient is from: SNF              Anticipated d/c is to: SNF              Patient currently is not medically stable to d/c.   Difficult to  place patient No   Estimated body mass index is 21.95 kg/m as calculated from the following:   Height as of 03/16/21: '5\' 3"'$  (1.6 m).   Weight as of this encounter: 56.2 kg.  Nutritional Assessment: Body mass index is 21.95 kg/m.Marland Kitchen Seen by dietician.  I agree with the assessment and plan as outlined below: Nutrition Status:  Skin Assessment: I have examined the patient's skin and I agree with the wound assessment as performed by the wound care RN as outlined below:    Consultants:  None  Procedures:   None  Antimicrobials:  Anti-infectives (From admission, onward)    Start     Dose/Rate Route Frequency Ordered Stop   03/30/21 1400  cefTRIAXone (ROCEPHIN) 2 g in sodium chloride 0.9 % 100 mL IVPB        2 g 200 mL/hr over 30 Minutes Intravenous Every 24 hours 03/30/21 1233 04/04/21 1359   03/30/21 1400  azithromycin (ZITHROMAX) 500 mg in sodium chloride 0.9 % 250 mL IVPB        500 mg 250 mL/hr over 60 Minutes Intravenous Every 24 hours 03/30/21 1233 04/04/21 1359   03/27/21 1000  remdesivir 100 mg in sodium chloride 0.9 % 100 mL IVPB       See Hyperspace for full Linked Orders Report.   100 mg 200 mL/hr over 30 Minutes Intravenous Daily 03/26/21 0921 03/31/21 0739   03/26/21 0930  remdesivir 200 mg in sodium chloride 0.9% 250 mL IVPB       See Hyperspace for full Linked Orders Report.   200 mg 580 mL/hr over 30 Minutes Intravenous Once 03/26/21 0921 03/26/21 1528   03/26/21 0715  cefTRIAXone (ROCEPHIN) 1 g in sodium chloride 0.9 % 100 mL IVPB        1 g 200 mL/hr over 30 Minutes Intravenous  Once 03/26/21 0703 03/26/21 0933          Subjective: Patient seen and examined.  Despite of remaining on 20 L oxygen, he is totally comfortable with no shortness of breath and no dyspnea.  Objective: Vitals:   04/02/21 0352 04/02/21 0625 04/02/21 0743 04/02/21 0915  BP: (!) 159/76 (!) 183/93 (!) 118/92   Pulse: 83 82 89 83  Resp: '18 20 18 15  '$ Temp: 98 F (36.7 C) 98 F (36.7 C) 98.8 F (37.1 C)   TempSrc: Oral Oral Oral   SpO2: 92% 95% 97% 95%  Weight:        Intake/Output Summary (Last 24 hours) at 04/02/2021 1110 Last data filed at 04/01/2021 1342 Gross per 24 hour  Intake --  Output 101 ml  Net -101 ml    Filed Weights   03/31/21 1244 03/31/21 1609  Weight: 58.2 kg 56.2 kg    Examination:  General exam: Appears calm and comfortable  Respiratory system: Diminished breath sounds bilaterally. Respiratory effort normal. Cardiovascular system: S1 & S2 heard, RRR.  No JVD, murmurs, rubs, gallops or clicks. No pedal edema. Gastrointestinal system: Abdomen is nondistended, soft and nontender. No organomegaly or masses felt. Normal bowel sounds heard. Central nervous system: Alert and oriented. No focal neurological deficits. Extremities: Symmetric 5 x 5 power. Skin: No rashes, lesions or ulcers.  Psychiatry: Judgement and insight appear normal. Mood & affect appropriate.    Data Reviewed: I have personally reviewed following labs and imaging studies  CBC: Recent Labs  Lab 03/27/21 0645 03/28/21 0514 03/29/21 0638 03/30/21 0128 03/31/21 0236 03/31/21 1112  WBC 10.5 16.2* 10.8* 12.1* 13.3* 17.7*  NEUTROABS 9.5* 14.1* 8.7* 10.6* 12.0*  --   HGB 12.2* 11.4* 11.0* 10.3* 11.0* 10.5*  HCT 38.4* 35.4* 33.3* 31.7* 33.0* 31.2*  MCV 87.5 86.1 84.7 84.8 84.2 83.0  PLT 145* 163 131* 145* 81* 0000000    Basic Metabolic Panel: Recent Labs  Lab 03/27/21 0645 03/28/21 0514 03/29/21 0638 03/30/21 0128 03/31/21 0236 03/31/21 1656  NA 134* 129* 133* 131* 127* 126*  K 4.3 4.7 3.6 3.9 4.2 4.6  CL 93* 91* 96* 94* 91* 89*  CO2 '26 25 26 26 22 23  '$ GLUCOSE 119* 93 112* 230* 283* 166*  BUN 26* 54* 37* 55* 85* 91*  CREATININE 3.66* 5.02* 3.79* 4.78* 5.73* 5.91*  CALCIUM 8.5* 8.6* 8.3* 8.3* 8.5* 8.6*  MG 2.1 2.4 2.2 2.3 2.6*  --   PHOS 4.4 5.8* 3.5 2.8 2.3* 2.2*    GFR: Estimated Creatinine Clearance: 12 mL/min (A) (by C-G formula based on SCr of 5.91 mg/dL (H)). Liver Function Tests: Recent Labs  Lab 03/27/21 0645 03/28/21 0514 03/29/21 0638 03/30/21 0128 03/31/21 0236 03/31/21 1656  AST 34 '30 30 24 28  '$ --   ALT '15 12 14 13 25  '$ --   ALKPHOS 77 78 66 70 87  --   BILITOT 0.6 1.0 0.6 0.8 0.6  --   PROT 8.5* 7.3 6.2* 6.3* 7.0  --   ALBUMIN 3.5 3.1* 2.6* 2.7* 3.0* 2.9*    No results for input(s): LIPASE, AMYLASE in the last 168 hours. No results for input(s): AMMONIA in the last 168 hours. Coagulation Profile: No results for input(s): INR, PROTIME  in the last 168 hours. Cardiac Enzymes: No results for input(s): CKTOTAL, CKMB, CKMBINDEX, TROPONINI in the last 168 hours. BNP (last 3 results) No results for input(s): PROBNP in the last 8760 hours. HbA1C: No results for input(s): HGBA1C in the last 72 hours. CBG: Recent Labs  Lab 04/01/21 1638 04/01/21 2002 04/01/21 2318 04/02/21 0419 04/02/21 0746  GLUCAP 235* 291* 299* 268* 151*    Lipid Profile: No results for input(s): CHOL, HDL, LDLCALC, TRIG, CHOLHDL, LDLDIRECT in the last 72 hours. Thyroid Function Tests: No results for input(s): TSH, T4TOTAL, FREET4, T3FREE, THYROIDAB in the last 72 hours. Anemia Panel: Recent Labs    03/31/21 0236  FERRITIN 2,369*    Sepsis Labs: Recent Labs  Lab 03/30/21 0128  PROCALCITON 2.07     Recent Results (from the past 240 hour(s))  Resp Panel by RT-PCR (Flu A&B, Covid) Nasopharyngeal Swab     Status: Abnormal   Collection Time: 03/26/21  7:17 AM   Specimen: Nasopharyngeal Swab; Nasopharyngeal(NP) swabs in vial transport medium  Result Value Ref Range Status   SARS Coronavirus 2 by RT PCR POSITIVE (A) NEGATIVE Final    Comment: RESULT CALLED TO, READ BACK BY AND VERIFIED WITH: C LOZER RN 03/26/21 0904 JDW (NOTE) SARS-CoV-2 target nucleic acids are DETECTED.  The SARS-CoV-2 RNA is generally detectable in upper respiratory specimens during the acute phase of infection. Positive results are indicative of the presence of the identified virus, but do not rule out bacterial infection or co-infection with other pathogens not detected by the test. Clinical correlation with patient history and other diagnostic information is necessary to determine patient infection status. The expected result is Negative.  Fact Sheet for Patients: EntrepreneurPulse.com.au  Fact Sheet for Healthcare Providers: IncredibleEmployment.be  This test is not yet approved or cleared by the [place] FDA and  has  been authorized for detection and/or diagnosis of SARS-CoV-2  by FDA under an Emergency Use Authorization (EUA).  This EUA will remain in effect (meaning this test can be used ) for the duration of  the COVID-19 declaration under Section 564(b)(1) of the Act, 21 U.S.C. section 360bbb-3(b)(1), unless the authorization is terminated or revoked sooner.     Influenza A by PCR NEGATIVE NEGATIVE Final   Influenza B by PCR NEGATIVE NEGATIVE Final    Comment: (NOTE) The Xpert Xpress SARS-CoV-2/FLU/RSV plus assay is intended as an aid in the diagnosis of influenza from Nasopharyngeal swab specimens and should not be used as a sole basis for treatment. Nasal washings and aspirates are unacceptable for Xpert Xpress SARS-CoV-2/FLU/RSV testing.  Fact Sheet for Patients: EntrepreneurPulse.com.au  Fact Sheet for Healthcare Providers: IncredibleEmployment.be  This test is not yet approved or cleared by the [place] FDA and has been authorized for detection and/or diagnosis of SARS-CoV-2 by FDA under an Emergency Use Authorization (EUA). This EUA will remain in effect (meaning this test can be used) for the duration of the COVID-19 declaration under Section 564(b)(1) of the Act, 21 U.S.C. section 360bbb-3(b)(1), unless the authorization is terminated or revoked.  Performed at [facility], [place] [address], [place], [place] [address]   MRSA Next Gen by PCR, Nasal     Status: Abnormal   Collection Time: 03/26/21  2:33 PM   Specimen: Nasal Mucosa; Nasal Swab  Result Value Ref Range Status   MRSA by PCR Next Gen DETECTED (A) NOT DETECTED Final    Comment: RESULT CALLED TO, READ BACK BY AND VERIFIED WITH: JAMIE COLE RN 03/26/21 '@1738'$  BY JW  (NOTE) The GeneXpert MRSA Assay (FDA approved for NASAL specimens only), is one component of a comprehensive MRSA colonization surveillance program. It is not intended to diagnose MRSA infection nor to guide or  monitor treatment for MRSA infections. Test performance is not FDA approved in patients less than 84 years old. Performed at [facility], [place] [address], [place], [place] [address]        Radiology Studies: No results found.  Scheduled Meds:  acetaminophen  1,000 mg Oral Once   amLODipine  10 mg Oral Daily   aspirin  81 mg Oral Daily   atorvastatin  40 mg Oral Daily   brimonidine  1 drop Left Eye BID   Chlorhexidine Gluconate Cloth  6 each Topical Q0600   docusate sodium  100 mg Oral BID   dorzolamide-timolol  1 drop Left Eye BID   heparin injection (subcutaneous)  5,000 Units Subcutaneous Q8H   hydrALAZINE  25 mg Oral Q8H   insulin aspart  0-6 Units Subcutaneous Q4H   insulin glargine-yfgn  10 Units Subcutaneous Daily   latanoprost  1 drop Left Eye QHS   mupirocin ointment   Nasal BID   olopatadine  2 drop Both Eyes BID   polyethylene glycol  17 g Oral Daily   polyvinyl alcohol  1 drop Both Eyes TID   predniSONE  50 mg Oral Daily   pregabalin  25 mg Oral BID   senna  1 tablet Oral QHS   sertraline  25 mg Oral Daily   Continuous Infusions:  sodium chloride     sodium chloride     azithromycin 500 mg (04/01/21 1628)   cefTRIAXone (ROCEPHIN)  IV 2 g (04/01/21 1513)      LOS: 7 days   Time spent: 26 minutes   Darliss Cheney, MD [facility]  04/02/2021, 11:10 AM  Please page via  Amion and do not message via secure chat for anything urgent. Secure chat can be used for anything non urgent.  How to contact the [facility] Attending or Consulting provider [place] or covering provider during after hours [place], for this patient?  Check the care team in [facility] and look for a) attending/consulting [facility] provider listed and b) the [facility] team listed. Page or secure chat [phone]. Log into [email or web address] and use [place]'s universal password to access. If you do not have the password, please contact the hospital operator. Locate the [facility] provider you are looking for under [facility] and page to a number that you can be directly reached. If you still have difficulty reaching the provider, please page the [facility] (Director on Call) for the Hospitalists listed on amion for assistance.

## 2021-04-02 NOTE — Progress Notes (Signed)
[facility] Progress Note   Subjective:   Pt seen in room. Reports ongoing cough but denies SOB, CP, palpitations, dizziness, abdominal pain and nausea. Planned for HD tonight on covid shift.   Objective Vitals:   04/02/21 0352 04/02/21 0625 04/02/21 0743 04/02/21 0915  BP: (!) 159/76 (!) 183/93 (!) 118/92   Pulse: 83 82 89 83  Resp: '18 20 18 15  '$ Temp: 98 F (36.7 C) 98 F (36.7 C) 98.8 F (37.1 C)   TempSrc: Oral Oral Oral   SpO2: 92% 95% 97% 95%  Weight:       Physical Exam General: WDWN male, alert and in NAD Heart: RRR, no murmur Lungs: CTA bilaterally without wheezing, rhonchi or rales Abdomen: Soft, non-tender, non-distended, +BS Extremities: No edema b/l lower extremities Dialysis Access: RUE AVT + bruit  Additional Objective Labs: Basic Metabolic Panel: Recent Labs  Lab 03/30/21 0128 03/31/21 0236 03/31/21 1656  NA 131* 127* 126*  K 3.9 4.2 4.6  CL 94* 91* 89*  CO2 '26 22 23  '$ GLUCOSE 230* 283* 166*  BUN 55* 85* 91*  CREATININE 4.78* 5.73* 5.91*  CALCIUM 8.3* 8.5* 8.6*  PHOS 2.8 2.3* 2.2*   Liver Function Tests: Recent Labs  Lab 03/29/21 0638 03/30/21 0128 03/31/21 0236 03/31/21 1656  AST '30 24 28  '$ --   ALT '14 13 25  '$ --   ALKPHOS 66 70 87  --   BILITOT 0.6 0.8 0.6  --   PROT 6.2* 6.3* 7.0  --   ALBUMIN 2.6* 2.7* 3.0* 2.9*   No results for input(s): LIPASE, AMYLASE in the last 168 hours. CBC: Recent Labs  Lab 03/28/21 0514 03/29/21 0638 03/30/21 0128 03/31/21 0236 03/31/21 1112  WBC 16.2* 10.8* 12.1* 13.3* 17.7*  NEUTROABS 14.1* 8.7* 10.6* 12.0*  --   HGB 11.4* 11.0* 10.3* 11.0* 10.5*  HCT 35.4* 33.3* 31.7* 33.0* 31.2*  MCV 86.1 84.7 84.8 84.2 83.0  PLT 163 131* 145* 81* 158   Blood Culture    Component Value Date/Time   SDES BLOOD RIGHT HAND 10/27/2019 1208   SDES BLOOD RIGHT HAND 10/27/2019 1208   SPECREQUEST  10/27/2019 1208    BOTTLES DRAWN AEROBIC AND ANAEROBIC Blood Culture adequate volume   SPECREQUEST   10/27/2019 1208    BOTTLES DRAWN AEROBIC AND ANAEROBIC Blood Culture adequate volume   CULT  10/27/2019 1208    NO GROWTH 5 DAYS Performed at [facility], [place] [address], [place], [place] [address]    CULT  10/27/2019 1208    NO GROWTH 5 DAYS Performed at [facility], [place] [address] [place], [place] [address]    REPTSTATUS 11/01/2019 FINAL 10/27/2019 1208   REPTSTATUS 11/01/2019 FINAL 10/27/2019 1208    Cardiac Enzymes: No results for input(s): CKTOTAL, CKMB, CKMBINDEX, TROPONINI in the last 168 hours. CBG: Recent Labs  Lab 04/01/21 1638 04/01/21 2002 04/01/21 2318 04/02/21 0419 04/02/21 0746  GLUCAP 235* 291* 299* 268* 151*   Iron Studies:  Recent Labs    03/31/21 0236  FERRITIN 2,369*   '@lablastinr3'$ @ Studies/Results: No results found. Medications:  sodium chloride     sodium chloride     azithromycin 500 mg (04/01/21 1628)   cefTRIAXone (ROCEPHIN)  IV 2 g (04/01/21 1513)    acetaminophen  1,000 mg Oral Once   amLODipine  10 mg Oral Daily   aspirin  81 mg Oral Daily   atorvastatin  40 mg Oral Daily   brimonidine  1 drop  Left Eye BID   calcium acetate  667 mg Oral TID WC   Chlorhexidine Gluconate Cloth  6 each Topical Q0600   docusate sodium  100 mg Oral BID   dorzolamide-timolol  1 drop Left Eye BID   heparin injection (subcutaneous)  5,000 Units Subcutaneous Q8H   hydrALAZINE  25 mg Oral Q8H   insulin aspart  0-6 Units Subcutaneous Q4H   insulin glargine-yfgn  10 Units Subcutaneous Daily   latanoprost  1 drop Left Eye QHS   mupirocin ointment   Nasal BID   olopatadine  2 drop Both Eyes BID   polyethylene glycol  17 g Oral Daily   polyvinyl alcohol  1 drop Both Eyes TID   predniSONE  50 mg Oral Daily   pregabalin  25 mg Oral BID   senna  1 tablet Oral QHS   sertraline  25 mg Oral Daily    Dialysis Orders: Outpatient orders: [facility], MWF, 4 hrs, F180, 2k, 2cal, edw 53kg, no heparin  Assessment/Plan:   1) ESRD:  Tolerated HD 10/15  with 2L net UF but refused Friday; he feels poorly last hour and UF needs to be turned off. Will plan on HD Today with UFP#2. 2)  AHRF COVID positive 03/24/2021 Pulmonary edema, left pleural effusion - completed 4d course of remdesivir per primary service, on pred -thora 10/10: 1200cc drained 3)  Hyponatremia -likely hypervolemic, worsening over the weekend, volume likely accumulating due to missed HD on Friday. managing with HD: 137 Na bath, UF as tolerated. Improving 4)  Volume/ hypertension: EDW 53kg. UF as tolerated, 3.2kg over EDW. See above.  5)  Anemia of Chronic Kidney Disease: Hemoglobin 10.5, Receiving Mircera 35mg qtreatment, last dose 10/3 as an outpatient. Hold for now 6) Secondary Hyperparathyroidism/Hyperphosphatemia: Calcium controlled. Phos running low, discontinuing his binders for now.  8) Additional recommendations: - Dose all meds for creatinine clearance < 10 ml/min  - Unless absolutely necessary, no MRIs with gadolinium.  -  No blood pressure measurements in rt arm. - If blood transfusion is requested during hemodialysis sessions, please alert [place]aprior to the session.   SAnice Paganini PA-C 04/02/2021, 10:52 AM  [facility] Pager: [phone]

## 2021-04-03 ENCOUNTER — Inpatient Hospital Stay (HOSPITAL_COMMUNITY): Payer: Medicare Other

## 2021-04-03 DIAGNOSIS — J9601 Acute respiratory failure with hypoxia: Secondary | ICD-10-CM | POA: Diagnosis not present

## 2021-04-03 LAB — CBC
HCT: 29 % — ABNORMAL LOW (ref 39.0–52.0)
Hemoglobin: 9.5 g/dL — ABNORMAL LOW (ref 13.0–17.0)
MCH: 27.5 pg (ref 26.0–34.0)
MCHC: 32.8 g/dL (ref 30.0–36.0)
MCV: 84.1 fL (ref 80.0–100.0)
Platelets: 171 10*3/uL (ref 150–400)
RBC: 3.45 MIL/uL — ABNORMAL LOW (ref 4.22–5.81)
RDW: 15 % (ref 11.5–15.5)
WBC: 16.2 10*3/uL — ABNORMAL HIGH (ref 4.0–10.5)
nRBC: 0 % (ref 0.0–0.2)

## 2021-04-03 LAB — RENAL FUNCTION PANEL
Albumin: 2.8 g/dL — ABNORMAL LOW (ref 3.5–5.0)
Anion gap: 12 (ref 5–15)
BUN: 77 mg/dL — ABNORMAL HIGH (ref 6–20)
CO2: 23 mmol/L (ref 22–32)
Calcium: 8.4 mg/dL — ABNORMAL LOW (ref 8.9–10.3)
Chloride: 93 mmol/L — ABNORMAL LOW (ref 98–111)
Creatinine, Ser: 5.91 mg/dL — ABNORMAL HIGH (ref 0.61–1.24)
GFR, Estimated: 11 mL/min — ABNORMAL LOW (ref >60)
Glucose, Bld: 300 mg/dL — ABNORMAL HIGH (ref 70–99)
Phosphorus: 3.5 mg/dL (ref 2.5–4.6)
Potassium: 4.4 mmol/L (ref 3.5–5.1)
Sodium: 128 mmol/L — ABNORMAL LOW (ref 135–145)

## 2021-04-03 LAB — GLUCOSE, CAPILLARY
Glucose-Capillary: 286 mg/dL — ABNORMAL HIGH (ref 70–99)
Glucose-Capillary: 156 mg/dL — ABNORMAL HIGH (ref 70–99)
Glucose-Capillary: 199 mg/dL — ABNORMAL HIGH (ref 70–99)
Glucose-Capillary: 305 mg/dL — ABNORMAL HIGH (ref 70–99)
Glucose-Capillary: 231 mg/dL — ABNORMAL HIGH (ref 70–99)
Glucose-Capillary: 366 mg/dL — ABNORMAL HIGH (ref 70–99)

## 2021-04-03 MED ORDER — CHLORHEXIDINE GLUCONATE CLOTH 2 % EX PADS
6.0000 | MEDICATED_PAD | Freq: Every day | CUTANEOUS | Status: DC
  Administered 2021-04-04 – 2021-04-05 (×2): 6 via TOPICAL

## 2021-04-03 MED ORDER — INSULIN GLARGINE-YFGN 100 UNIT/ML ~~LOC~~ SOLN
20.0000 [IU] | Freq: Every day | SUBCUTANEOUS | Status: DC
  Administered 2021-04-03: 20 [IU] via SUBCUTANEOUS
  Filled 2021-04-03 (×2): qty 0.2

## 2021-04-03 NOTE — Progress Notes (Signed)
HD at bedside.

## 2021-04-03 NOTE — Progress Notes (Signed)
PROGRESS NOTE    Christopher Davila  [id number] DOB: 1971/01/07 DOA: 03/26/2021 PCP: [name], MD   Brief Narrative:  50 y/o M who presented to [facility] on 10/10 with reports of shortness of breath.    The patient lives in a facility and is baseline 3L O2 dependent.  He is HD dependent M/W/F.  The facility [facility]) called EMS for shortness of breath. He normally wears 3L oxygen, oxygen sats were in the 60's and was increased to 5L at the facility without improvement. EMS was called and confirmed hypoxia.  The patient was COVID positive 2 days prior to admit (10/8) at the facility.  On arrival to the ER, he failed salter O2 and was placed on CPAP without improvement.  He ultimately was placed on BiPAP with improvement in saturations and work of breathing.  His last HD session was on Friday 10/7.  EKG showed ST.  He was hypertensive and exam suggestive of volume overload.  CXR showed chronic left pleural effusion.  The patient pulled his biPAP off and desaturated into the 50's.  He was treated empirically with rocephin.  COVID status was confirmed positive, flu negative. Initial labs showed K 5.2, glucose 120, BUN 43, sr cr 6.10, AG 17, troponin 29, WBC 11.4, Hgb 11.7 and platelets 165.     PCCM called for ICU admission.  Transferred under [place] on 03/29/2021.  Assessment & Plan:   Active Problems:   Acute respiratory failure with hypoxia (HCC)   History of thoracentesis   S/P thoracentesis   Acute on chronic hypoxic Respiratory Failure/COVID-19 pneumonitis/pulmonary edema/fluid overload: Multifactorial,  still on 20 L of high flow oxygen but despite of that, he denies any shortness of breath and looks very comfortable without any dyspnea or tachypnea..  Continue dialysis to remove fluid.  Completed 5/5 days of remdesivir on 03/31/2019.  Continue prednisone.  Inflammatory markers improving.  Continue to wean oxygen as able to, perhaps needs some more hemodialysis as his session was cut  short even yesterday.  Patient was encouraged to prone, out of bed to chair, to use incentive spirometry and flutter valve.  Significantly elevated D-dimer, CT angiogram negative for PE.  Procalcitonin was elevated 4 days ago, much higher on repeat labs on 03/30/2021.  Continue Zithromax and Rocephin.  Nursing not having good luck with weaning him down, he is desaturating.  We will repeat chest x-ray today.  Perhaps he needs aggressive fluid removal by dialysis.  Essential hypertension: Continue amlodipine and hydralazine, blood pressure controlled.  Beta-blockers on hold.  ESRD on HD: Patient refused dialysis once again this morning and claims that he was told at around 1:30 AM to go for dialysis and he did not feel like he was rested enough and wanted to have sleep.  Had a lengthy discussion with the patient about trying to remain compliant with medical management.  HLD: Continue atorvastatin.   DM type II: Blood sugar still elevated.  Increase Semglee to 20 units and continue SSI.   Anemia of chronic disease: Stable.  DVT prophylaxis: heparin injection 5,000 Units Start: 03/26/21 2200 SCDs Start: 03/26/21 [id number]   Code Status: Full Code  Family Communication:  None present at bedside.  Plan of care discussed with patient in length and he verbalized understanding and agreed with it.  Status is: Inpatient  Remains inpatient appropriate because:Inpatient level of care appropriate due to severity of illness  Dispo: The patient is from: SNF  Anticipated d/c is to: SNF              Patient currently is not medically stable to d/c.   Difficult to place patient No   Estimated body mass index is 21.95 kg/m as calculated from the following:   Height as of 03/16/21: '5\' 3"'$  (1.6 m).   Weight as of this encounter: 56.2 kg.  Nutritional Assessment: Body mass index is 21.95 kg/m.Marland Kitchen Seen by dietician.  I agree with the assessment and plan as outlined below: Nutrition Status:  Skin  Assessment: I have examined the patient's skin and I agree with the wound assessment as performed by the wound care RN as outlined below:    Consultants:  None  Procedures:  None  Antimicrobials:  Anti-infectives (From admission, onward)    Start     Dose/Rate Route Frequency Ordered Stop   03/30/21 1400  cefTRIAXone (ROCEPHIN) 2 g in sodium chloride 0.9 % 100 mL IVPB        2 g 200 mL/hr over 30 Minutes Intravenous Every 24 hours 03/30/21 1233 04/04/21 1359   03/30/21 1400  azithromycin (ZITHROMAX) 500 mg in sodium chloride 0.9 % 250 mL IVPB        500 mg 250 mL/hr over 60 Minutes Intravenous Every 24 hours 03/30/21 1233 04/04/21 1359   03/27/21 1000  remdesivir 100 mg in sodium chloride 0.9 % 100 mL IVPB       See Hyperspace for full Linked Orders Report.   100 mg 200 mL/hr over 30 Minutes Intravenous Daily 03/26/21 0921 03/31/21 0739   03/26/21 0930  remdesivir 200 mg in sodium chloride 0.9% 250 mL IVPB       See Hyperspace for full Linked Orders Report.   200 mg 580 mL/hr over 30 Minutes Intravenous Once 03/26/21 0921 03/26/21 1528   03/26/21 0715  cefTRIAXone (ROCEPHIN) 1 g in sodium chloride 0.9 % 100 mL IVPB        1 g 200 mL/hr over 30 Minutes Intravenous  Once 03/26/21 0703 03/26/21 0933          Subjective: Patient seen and examined.  No shortness of breath or any other complaint.  Just frustrated that he was asked to go for dialysis in the middle of the night.  Objective: Vitals:   04/03/21 0230 04/03/21 0750 04/03/21 0858 04/03/21 0931  BP:  (!) 162/87    Pulse:  83 87   Resp:  15 17   Temp:  97.7 F (36.5 C)    TempSrc:  Oral    SpO2: 97% 95% 97% 91%  Weight:        Intake/Output Summary (Last 24 hours) at 04/03/2021 1135 Last data filed at 04/03/2021 1116 Gross per 24 hour  Intake --  Output 450 ml  Net -450 ml    Filed Weights   03/31/21 1244 03/31/21 1609  Weight: 58.2 kg 56.2 kg    Examination:  General exam: Appears calm and  comfortable  Respiratory system: Breath sounds bilaterally. Respiratory effort normal. Cardiovascular system: S1 & S2 heard, RRR. No JVD, murmurs, rubs, gallops or clicks. No pedal edema. Gastrointestinal system: Abdomen is nondistended, soft and nontender. No organomegaly or masses felt. Normal bowel sounds heard. Central nervous system: Alert and oriented. No focal neurological deficits. Extremities: Symmetric 5 x 5 power. Skin: No rashes, lesions or ulcers.  Psychiatry: Judgement and insight appear poor   Data Reviewed: I have personally reviewed following labs and imaging studies  CBC: Recent Labs  Lab 03/28/21 0514 03/29/21 [id number] 03/30/21 0128 03/31/21 0236 03/31/21 1112 04/03/21 0208  WBC 16.2* 10.8* 12.1* 13.3* 17.7* 16.2*  NEUTROABS 14.1* 8.7* 10.6* 12.0*  --   --   HGB 11.4* 11.0* 10.3* 11.0* 10.5* 9.5*  HCT 35.4* 33.3* 31.7* 33.0* 31.2* 29.0*  MCV 86.1 84.7 84.8 84.2 83.0 84.1  PLT 163 131* 145* 81* 158 [id number]    Basic Metabolic Panel: Recent Labs  Lab 03/28/21 0514 03/29/21 0638 03/30/21 0128 03/31/21 0236 03/31/21 1656 04/03/21 0208  NA 129* 133* 131* 127* 126* 128*  K 4.7 3.6 3.9 4.2 4.6 4.4  CL 91* 96* 94* 91* 89* 93*  CO2 '25 26 26 22 23 23  '$ GLUCOSE 93 112* 230* 283* 166* 300*  BUN 54* 37* 55* 85* 91* 77*  CREATININE 5.02* 3.79* 4.78* 5.73* 5.91* 5.91*  CALCIUM 8.6* 8.3* 8.3* 8.5* 8.6* 8.4*  MG 2.4 2.2 2.3 2.6*  --   --   PHOS 5.8* 3.5 2.8 2.3* 2.2* 3.5    GFR: Estimated Creatinine Clearance: 12 mL/min (A) (by C-G formula based on SCr of 5.91 mg/dL (H)). Liver Function Tests: Recent Labs  Lab 03/28/21 0514 03/29/21 [id number] 03/30/21 0128 03/31/21 0236 03/31/21 1656 04/03/21 0208  AST '30 30 24 28  '$ --   --   ALT '12 14 13 25  '$ --   --   ALKPHOS 78 66 70 87  --   --   BILITOT 1.0 0.6 0.8 0.6  --   --   PROT 7.3 6.2* 6.3* 7.0  --   --   ALBUMIN 3.1* 2.6* 2.7* 3.0* 2.9* 2.8*    No results for input(s): LIPASE, AMYLASE in the last 168 hours. No  results for input(s): AMMONIA in the last 168 hours. Coagulation Profile: No results for input(s): INR, PROTIME in the last 168 hours. Cardiac Enzymes: No results for input(s): CKTOTAL, CKMB, CKMBINDEX, TROPONINI in the last 168 hours. BNP (last 3 results) No results for input(s): PROBNP in the last 8760 hours. HbA1C: No results for input(s): HGBA1C in the last 72 hours. CBG: Recent Labs  Lab 04/02/21 1656 04/02/21 2019 04/02/21 2351 04/03/21 0323 04/03/21 0752  GLUCAP 297* 260* 309* 286* 156*    Lipid Profile: No results for input(s): CHOL, HDL, LDLCALC, TRIG, CHOLHDL, LDLDIRECT in the last 72 hours. Thyroid Function Tests: No results for input(s): TSH, T4TOTAL, FREET4, T3FREE, THYROIDAB in the last 72 hours. Anemia Panel: No results for input(s): VITAMINB12, FOLATE, FERRITIN, TIBC, IRON, RETICCTPCT in the last 72 hours.  Sepsis Labs: Recent Labs  Lab 03/30/21 0128  PROCALCITON 2.07     Recent Results (from the past 240 hour(s))  Resp Panel by RT-PCR (Flu A&B, Covid) Nasopharyngeal Swab     Status: Abnormal   Collection Time: 03/26/21  7:17 AM   Specimen: Nasopharyngeal Swab; Nasopharyngeal(NP) swabs in vial transport medium  Result Value Ref Range Status   SARS Coronavirus 2 by RT PCR POSITIVE (A) NEGATIVE Final    Comment: RESULT CALLED TO, READ BACK BY AND VERIFIED WITH: C LOZER RN 03/26/21 0904 JDW (NOTE) SARS-CoV-2 target nucleic acids are DETECTED.  The SARS-CoV-2 RNA is generally detectable in upper respiratory specimens during the acute phase of infection. Positive results are indicative of the presence of the identified virus, but do not rule out bacterial infection or co-infection with other pathogens not detected by the test. Clinical correlation with patient history and other diagnostic information is necessary to determine patient infection status. The expected result is Negative.  Fact Sheet for  Patients: EntrepreneurPulse.com.au  Fact Sheet for Healthcare Providers: IncredibleEmployment.be  This test is not yet approved or cleared by the [place] FDA and  has been authorized for detection and/or diagnosis of SARS-CoV-2 by FDA under an Emergency Use Authorization (EUA).  This EUA will remain in effect (meaning this test can be used ) for the duration of  the COVID-19 declaration under Section 564(b)(1) of the Act, 21 U.S.C. section 360bbb-3(b)(1), unless the authorization is terminated or revoked sooner.     Influenza A by PCR NEGATIVE NEGATIVE Final   Influenza B by PCR NEGATIVE NEGATIVE Final    Comment: (NOTE) The Xpert Xpress SARS-CoV-2/FLU/RSV plus assay is intended as an aid in the diagnosis of influenza from Nasopharyngeal swab specimens and should not be used as a sole basis for treatment. Nasal washings and aspirates are unacceptable for Xpert Xpress SARS-CoV-2/FLU/RSV testing.  Fact Sheet for Patients: EntrepreneurPulse.com.au  Fact Sheet for Healthcare Providers: IncredibleEmployment.be  This test is not yet approved or cleared by the [place] FDA and has been authorized for detection and/or diagnosis of SARS-CoV-2 by FDA under an Emergency Use Authorization (EUA). This EUA will remain in effect (meaning this test can be used) for the duration of the COVID-19 declaration under Section 564(b)(1) of the Act, 21 U.S.C. section 360bbb-3(b)(1), unless the authorization is terminated or revoked.  Performed at [facility], [place] [address], [place], [place] [address]   MRSA Next Gen by PCR, Nasal     Status: Abnormal   Collection Time: 03/26/21  2:33 PM   Specimen: Nasal Mucosa; Nasal Swab  Result Value Ref Range Status   MRSA by PCR Next Gen DETECTED (A) NOT DETECTED Final    Comment: RESULT CALLED TO, READ BACK BY AND VERIFIED WITH: JAMIE COLE RN 03/26/21 '@1738'$  BY JW   (NOTE) The GeneXpert MRSA Assay (FDA approved for NASAL specimens only), is one component of a comprehensive MRSA colonization surveillance program. It is not intended to diagnose MRSA infection nor to guide or monitor treatment for MRSA infections. Test performance is not FDA approved in patients less than 60 years old. Performed at [facility], [place] [address] [place],  [address]        Radiology Studies: No results found.  Scheduled Meds:  acetaminophen  1,000 mg Oral Once   amLODipine  10 mg Oral Daily   aspirin  81 mg Oral Daily   atorvastatin  40 mg Oral Daily   brimonidine  1 drop Left Eye BID   Chlorhexidine Gluconate Cloth  6 each Topical Q0600   docusate sodium  100 mg Oral BID   dorzolamide-timolol  1 drop Left Eye BID   heparin injection (subcutaneous)  5,000 Units Subcutaneous Q8H   hydrALAZINE  25 mg Oral Q8H   insulin aspart  0-6 Units Subcutaneous Q4H   insulin glargine-yfgn  20 Units Subcutaneous Daily   latanoprost  1 drop Left Eye QHS   mupirocin ointment   Nasal BID   olopatadine  2 drop Both Eyes BID   polyethylene glycol  17 g Oral Daily   polyvinyl alcohol  1 drop Both Eyes TID   predniSONE  50 mg Oral Daily   pregabalin  25 mg Oral BID   senna  1 tablet Oral QHS   sertraline  25 mg Oral Daily   Continuous Infusions:  sodium chloride     sodium chloride     azithromycin 500 mg (04/02/21 1620)  cefTRIAXone (ROCEPHIN)  IV 2 g (04/02/21 1446)      LOS: 8 days   Time spent: 27 minutes   Darliss Cheney, MD [facility]  04/03/2021, 11:35 AM  Please page via [place] and do not message via secure chat for anything urgent. Secure chat can be used for anything non urgent.  How to contact the [facility] Attending or Consulting provider [place] or covering provider during after hours [place], for this patient?  Check the care team in [facility] and look for a) attending/consulting [facility] provider listed and b) the [facility] team listed. Page or secure  chat [phone]. Log into [email or web address] and use [facility]'s universal password to access. If you do not have the password, please contact the hospital operator. Locate the [facility] provider you are looking for under [facility] and page to a number that you can be directly reached. If you still have difficulty reaching the provider, please page the [facility] (Director on Call) for the Hospitalists listed on amion for assistance.

## 2021-04-03 NOTE — Progress Notes (Signed)
Informed by primary nurse that patient is refusing dialysis treatment and wishes to be rescheduled for later today (04/03/2021). On call nephrologist made aware.

## 2021-04-03 NOTE — Plan of Care (Signed)
  Problem: Education: Goal: Knowledge of General Education information will improve Description: Including pain rating scale, medication(s)/side effects and non-pharmacologic comfort measures 04/03/2021 1950 by Bernardo Heater, RN Outcome: Progressing 04/03/2021 1950 by Bernardo Heater, RN Outcome: Progressing   Problem: Health Behavior/Discharge Planning: Goal: Ability to manage health-related needs will improve 04/03/2021 1950 by Bernardo Heater, RN Outcome: Progressing 04/03/2021 1950 by Bernardo Heater, RN Outcome: Progressing

## 2021-04-03 NOTE — Progress Notes (Signed)
[facility] Progress Note   Subjective:   Refused dialysis again last night, HD RN planning for dialysis today. Pt still requiring high flow O2 but reports he does not feel short of breath and does not think he has any excess fluid. Denies CP and dizziness. Eating lunch.  Objective Vitals:   04/03/21 0230 04/03/21 0750 04/03/21 0858 04/03/21 0931  BP:  (!) 162/87    Pulse:  83 87   Resp:  15 17   Temp:  97.7 F (36.5 C)    TempSrc:  Oral    SpO2: 97% 95% 97% 91%  Weight:       Physical Exam General: WDWN male, alert and in NAD Heart: RRR, no murmur Lungs: CTA bilaterally with diminished breath sounds bilaterally Abdomen: Soft, non-tender, non-distended, +BS Extremities: No edema b/l lower extremities Dialysis Access: RUE AVT + bruit  Additional Objective Labs: Basic Metabolic Panel: Recent Labs  Lab 03/31/21 0236 03/31/21 1656 04/03/21 0208  NA 127* 126* 128*  K 4.2 4.6 4.4  CL 91* 89* 93*  CO2 '22 23 23  '$ GLUCOSE 283* 166* 300*  BUN 85* 91* 77*  CREATININE 5.73* 5.91* 5.91*  CALCIUM 8.5* 8.6* 8.4*  PHOS 2.3* 2.2* 3.5   Liver Function Tests: Recent Labs  Lab 03/29/21 0638 03/30/21 0128 03/31/21 0236 03/31/21 1656 04/03/21 0208  AST '30 24 28  '$ --   --   ALT '14 13 25  '$ --   --   ALKPHOS 66 70 87  --   --   BILITOT 0.6 0.8 0.6  --   --   PROT 6.2* 6.3* 7.0  --   --   ALBUMIN 2.6* 2.7* 3.0* 2.9* 2.8*   No results for input(s): LIPASE, AMYLASE in the last 168 hours. CBC: Recent Labs  Lab 03/29/21 0638 03/30/21 0128 03/31/21 0236 03/31/21 1112 04/03/21 0208  WBC 10.8* 12.1* 13.3* 17.7* 16.2*  NEUTROABS 8.7* 10.6* 12.0*  --   --   HGB 11.0* 10.3* 11.0* 10.5* 9.5*  HCT 33.3* 31.7* 33.0* 31.2* 29.0*  MCV 84.7 84.8 84.2 83.0 84.1  PLT 131* 145* 81* 158 171   Blood Culture    Component Value Date/Time   SDES BLOOD RIGHT HAND 10/27/2019 1208   SDES BLOOD RIGHT HAND 10/27/2019 1208   SPECREQUEST  10/27/2019 1208    BOTTLES DRAWN AEROBIC  AND ANAEROBIC Blood Culture adequate volume   SPECREQUEST  10/27/2019 1208    BOTTLES DRAWN AEROBIC AND ANAEROBIC Blood Culture adequate volume   CULT  10/27/2019 1208    NO GROWTH 5 DAYS Performed at [facility], [place] [address] [place], [place] [address]    CULT  10/27/2019 1208    NO GROWTH 5 DAYS Performed at [facility], [place] [address] [place],  [address]    REPTSTATUS 11/01/2019 FINAL 10/27/2019 1208   REPTSTATUS 11/01/2019 FINAL 10/27/2019 1208    Cardiac Enzymes: No results for input(s): CKTOTAL, CKMB, CKMBINDEX, TROPONINI in the last 168 hours. CBG: Recent Labs  Lab 04/02/21 1656 04/02/21 2019 04/02/21 2351 04/03/21 0323 04/03/21 0752  GLUCAP 297* 260* 309* 286* 156*   Iron Studies: No results for input(s): IRON, TIBC, TRANSFERRIN, FERRITIN in the last 72 hours. '@lablastinr3'$ @ Studies/Results: No results found. Medications:  sodium chloride     sodium chloride     azithromycin 500 mg (04/02/21 1620)   cefTRIAXone (ROCEPHIN)  IV 2 g (04/02/21 1446)    acetaminophen  1,000 mg Oral Once  amLODipine  10 mg Oral Daily   aspirin  81 mg Oral Daily   atorvastatin  40 mg Oral Daily   brimonidine  1 drop Left Eye BID   Chlorhexidine Gluconate Cloth  6 each Topical Q0600   docusate sodium  100 mg Oral BID   dorzolamide-timolol  1 drop Left Eye BID   heparin injection (subcutaneous)  5,000 Units Subcutaneous Q8H   hydrALAZINE  25 mg Oral Q8H   insulin aspart  0-6 Units Subcutaneous Q4H   insulin glargine-yfgn  20 Units Subcutaneous Daily   latanoprost  1 drop Left Eye QHS   mupirocin ointment   Nasal BID   olopatadine  2 drop Both Eyes BID   polyethylene glycol  17 g Oral Daily   polyvinyl alcohol  1 drop Both Eyes TID   predniSONE  50 mg Oral Daily   pregabalin  25 mg Oral BID   senna  1 tablet Oral QHS   sertraline  25 mg Oral Daily    Dialysis Orders: Outpatient orders: [facility], MWF, 4 hrs, F180, 2k, 2cal, edw 53kg, no  heparin  Assessment/Plan: 1) ESRD:  Tolerated HD 10/15 with 2L net UF but refused Friday and last night (does not like to go overnight which is understandable but is sometimes necessary due to census/covid pt needing HD in isolation); he feels poorly last hour and UF needs to be turned off. Will plan on HD Today with UFP#2. HD again tomorrow.  2)  AHRF COVID positive 03/24/2021 Pulmonary edema, left pleural effusion - completed 4d course of remdesivir per primary service, on pred -thora 10/10: 1200cc drained 3)  Hyponatremia -likely hypervolemic, worsening over the weekend, volume likely accumulating due to missed HD on Friday. managing with HD: 137 Na bath, UF as tolerated. 4)  Volume/ hypertension: EDW 53kg. UF as tolerated, See above.  5)  Anemia of Chronic Kidney Disease: Hemoglobin 9.6, Receiving Mircera 64mg qtreatment, last dose 10/3 as an outpatient. Will order ESA with HD tomorrow if Hgb is still low.  6) Secondary Hyperparathyroidism/Hyperphosphatemia: Calcium controlled. Phos running low, discontinuing his binders for now.  8) Additional recommendations: - Dose all meds for creatinine clearance < 10 ml/min  - Unless absolutely necessary, no MRIs with gadolinium.  -  No blood pressure measurements in rt arm. - If blood transfusion is requested during hemodialysis sessions, please alert uKoreaprior to the session.   SAnice Paganini PA-C 04/03/2021, 11:38 AM  [facility] Pager: [phone]

## 2021-04-03 NOTE — Plan of Care (Signed)
  Problem: Education: Goal: Knowledge of General Education information will improve Description Including pain rating scale, medication(s)/side effects and non-pharmacologic comfort measures Outcome: Progressing   Problem: Health Behavior/Discharge Planning: Goal: Ability to manage health-related needs will improve Outcome: Progressing   

## 2021-04-04 DIAGNOSIS — J9601 Acute respiratory failure with hypoxia: Secondary | ICD-10-CM | POA: Diagnosis not present

## 2021-04-04 LAB — GLUCOSE, CAPILLARY
Glucose-Capillary: 305 mg/dL — ABNORMAL HIGH (ref 70–99)
Glucose-Capillary: 152 mg/dL — ABNORMAL HIGH (ref 70–99)
Glucose-Capillary: 118 mg/dL — ABNORMAL HIGH (ref 70–99)
Glucose-Capillary: 153 mg/dL — ABNORMAL HIGH (ref 70–99)
Glucose-Capillary: 200 mg/dL — ABNORMAL HIGH (ref 70–99)
Glucose-Capillary: 260 mg/dL — ABNORMAL HIGH (ref 70–99)

## 2021-04-04 MED ORDER — INSULIN GLARGINE-YFGN 100 UNIT/ML ~~LOC~~ SOLN
30.0000 [IU] | Freq: Every day | SUBCUTANEOUS | Status: DC
  Administered 2021-04-04 – 2021-04-06 (×3): 30 [IU] via SUBCUTANEOUS
  Filled 2021-04-04 (×5): qty 0.3

## 2021-04-04 NOTE — Progress Notes (Signed)
[facility] Progress Note   Subjective:   Pt seen in room. We were able to remove 2.5L UF with HD yesterday but O2 requirements have remained the same. He continues to deny SOB, cough, CP, palpitations or dizziness. Agrees to HD again today.   Objective Vitals:   04/04/21 0400 04/04/21 0404 04/04/21 0750 04/04/21 0757  BP: (!) 159/95   (!) 163/88  Pulse: 82 80 83 84  Resp: '13 12 16 15  '$ Temp: 98.7 F (37.1 C)   98.5 F (36.9 C)  TempSrc: Oral   Oral  SpO2: 100% 100% 100% 100%  Weight:       Physical Exam General: WDWN male, alert and in NAD Heart: RRR, no murmur Lungs: CTA bilaterally with diminished breath sounds bilaterally Abdomen: Soft, non-tender, non-distended, +BS Extremities: No edema b/l lower extremities Dialysis Access: RUE AVT + bruit  Additional Objective Labs: Basic Metabolic Panel: Recent Labs  Lab 03/31/21 0236 03/31/21 1656 04/03/21 0208  NA 127* 126* 128*  K 4.2 4.6 4.4  CL 91* 89* 93*  CO2 '22 23 23  '$ GLUCOSE 283* 166* 300*  BUN 85* 91* 77*  CREATININE 5.73* 5.91* 5.91*  CALCIUM 8.5* 8.6* 8.4*  PHOS 2.3* 2.2* 3.5   Liver Function Tests: Recent Labs  Lab 03/29/21 0638 03/30/21 0128 03/31/21 0236 03/31/21 1656 04/03/21 0208  AST '30 24 28  '$ --   --   ALT '14 13 25  '$ --   --   ALKPHOS 66 70 87  --   --   BILITOT 0.6 0.8 0.6  --   --   PROT 6.2* 6.3* 7.0  --   --   ALBUMIN 2.6* 2.7* 3.0* 2.9* 2.8*   No results for input(s): LIPASE, AMYLASE in the last 168 hours. CBC: Recent Labs  Lab 03/29/21 0638 03/30/21 0128 03/31/21 0236 03/31/21 1112 04/03/21 0208  WBC 10.8* 12.1* 13.3* 17.7* 16.2*  NEUTROABS 8.7* 10.6* 12.0*  --   --   HGB 11.0* 10.3* 11.0* 10.5* 9.5*  HCT 33.3* 31.7* 33.0* 31.2* 29.0*  MCV 84.7 84.8 84.2 83.0 84.1  PLT 131* 145* 81* 158 171   Blood Culture    Component Value Date/Time   SDES BLOOD RIGHT HAND 10/27/2019 1208   SDES BLOOD RIGHT HAND 10/27/2019 1208   SPECREQUEST  10/27/2019 1208    BOTTLES  DRAWN AEROBIC AND ANAEROBIC Blood Culture adequate volume   SPECREQUEST  10/27/2019 1208    BOTTLES DRAWN AEROBIC AND ANAEROBIC Blood Culture adequate volume   CULT  10/27/2019 1208    NO GROWTH 5 DAYS Performed at [facility], [place] [address], [place], [place] [address]    CULT  10/27/2019 1208    NO GROWTH 5 DAYS Performed at [facility], [place] [address], [place], [place] [address]    REPTSTATUS 11/01/2019 FINAL 10/27/2019 1208   REPTSTATUS 11/01/2019 FINAL 10/27/2019 1208    Cardiac Enzymes: No results for input(s): CKTOTAL, CKMB, CKMBINDEX, TROPONINI in the last 168 hours. CBG: Recent Labs  Lab 04/03/21 1706 04/03/21 2009 04/04/21 0133 04/04/21 0536 04/04/21 0851  GLUCAP 231* 366* 305* 152* 118*   Iron Studies: No results for input(s): IRON, TIBC, TRANSFERRIN, FERRITIN in the last 72 hours. '@lablastinr3'$ @ Studies/Results: DG Chest Port 1 View  Result Date: 04/03/2021 CLINICAL DATA:  Hypoxia EXAM: PORTABLE CHEST 1 VIEW COMPARISON:  October 11th a fourteenth 2022 FINDINGS: The cardiomediastinal silhouette is unchanged in contour.Clips project over the RIGHT axilla. Small to moderate bilateral pleural  effusions, similar in comparison to prior no pneumothorax. Improvement in aeration of the RIGHT lung in comparison to March 27, 2021 with some persistent hazy opacities diffusely. Persistent hazy basilar predominant airspace opacities of the LEFT lung. Visualized abdomen is unremarkable. IMPRESSION: 1. Persistent but mildly improved appearance of bilateral LEFT greater than RIGHT hazy opacities consistent with the sequela of COVID-19 infection. 2. Small moderate bilateral pleural effusions. Electronically Signed   By: Valentino Saxon M.D.   On: 04/03/2021 13:37   Medications:  sodium chloride     sodium chloride      acetaminophen  1,000 mg Oral Once   amLODipine  10 mg Oral Daily   aspirin  81 mg Oral Daily   atorvastatin  40 mg Oral Daily   brimonidine  1  drop Left Eye BID   Chlorhexidine Gluconate Cloth  6 each Topical Q0600   Chlorhexidine Gluconate Cloth  6 each Topical Q0600   docusate sodium  100 mg Oral BID   dorzolamide-timolol  1 drop Left Eye BID   heparin injection (subcutaneous)  5,000 Units Subcutaneous Q8H   hydrALAZINE  25 mg Oral Q8H   insulin aspart  0-6 Units Subcutaneous Q4H   insulin glargine-yfgn  30 Units Subcutaneous Daily   latanoprost  1 drop Left Eye QHS   mupirocin ointment   Nasal BID   olopatadine  2 drop Both Eyes BID   polyethylene glycol  17 g Oral Daily   polyvinyl alcohol  1 drop Both Eyes TID   predniSONE  50 mg Oral Daily   pregabalin  25 mg Oral BID   senna  1 tablet Oral QHS   sertraline  25 mg Oral Daily    Dialysis Orders: Outpatient orders: [facility], MWF, 4 hrs, F180, 2k, 2cal, edw 53kg, no heparin  Assessment/Plan: 1) ESRD:  Tolerated HD 10/15 with 2L net UF but refused Friday and Monday night (does not like to go overnight which is understandable but is sometimes necessary due to census/covid pt needing HD in isolation); he feels poorly last hour and UF needs to be turned off. Tolerated 2.5L UF yesterday, will attempt 3L again today.  2)  AHRF COVID positive 03/24/2021 Pulmonary edema, left pleural effusion - completed 4d course of remdesivir per primary service, on pred -thora 10/10: 1200cc drained 3)  Hyponatremia -likely hypervolemic, worsening over the weekend, volume likely accumulating due to missed HD on Friday. managing with HD: UF as tolerated. 4)  Volume/ hypertension: EDW 53kg. UF as tolerated, See above.  5)  Anemia of Chronic Kidney Disease: Hemoglobin 9.5, Receiving Mircera 33mg qtreatment, last dose 10/3 as an outpatient. Will order ESA with HD if Hgb is still low today.  6) Secondary Hyperparathyroidism/Hyperphosphatemia: Calcium controlled. Phos was running low, discontinued his binders for now.  8) Additional recommendations: - Dose all meds for creatinine clearance <  10 ml/min  - Unless absolutely necessary, no MRIs with gadolinium.  -  No blood pressure measurements in rt arm. - If blood transfusion is requested during hemodialysis sessions, please alert uKoreaprior to the session.     SAnice Paganini PA-C 04/04/2021, 10:55 AM  [facility] Pager: [phone]

## 2021-04-04 NOTE — Progress Notes (Signed)
PROGRESS NOTE    Christopher Davila  [id number] DOB: Feb 27, 1971 DOA: 03/26/2021 PCP: [name], MD   Brief Narrative:  50 y/o M who presented to [facility] on 10/10 with reports of shortness of breath.    The patient lives in a facility and is baseline 3L O2 dependent.  He is HD dependent M/W/F.  The facility [facility]) called EMS for shortness of breath. He normally wears 3L oxygen, oxygen sats were in the 60's and was increased to 5L at the facility without improvement. EMS was called and confirmed hypoxia.  The patient was COVID positive 2 days prior to admit (10/8) at the facility.  On arrival to the ER, he failed salter O2 and was placed on CPAP without improvement.  He ultimately was placed on BiPAP with improvement in saturations and work of breathing.  His last HD session was on Friday 10/7.  EKG showed ST.  He was hypertensive and exam suggestive of volume overload.  CXR showed chronic left pleural effusion.  The patient pulled his biPAP off and desaturated into the 50's.  He was treated empirically with rocephin.  COVID status was confirmed positive, flu negative. Initial labs showed K 5.2, glucose 120, BUN 43, sr cr 6.10, AG 17, troponin 29, WBC 11.4, Hgb 11.7 and platelets 165.     PCCM called for ICU admission.  Transferred under [facility] on 03/29/2021.  Assessment & Plan:   Active Problems:   Acute respiratory failure with hypoxia (HCC)   History of thoracentesis   S/P thoracentesis   Acute on chronic hypoxic Respiratory Failure/COVID-19 pneumonitis/pulmonary edema/fluid overload: Multifactorial,  still on 20 L of high flow oxygen but despite of that, he denies any shortness of breath and looks very comfortable without any dyspnea or tachypnea..  Continue dialysis to remove fluid.  Completed 5/5 days of remdesivir on 03/31/2019.  Continue prednisone.  Inflammatory markers improving.  Continue to wean oxygen as able to [so far unsuccessful],   Patient was encouraged to prone, out of  bed to chair, to use incentive spirometry and flutter valve.  Significantly elevated D-dimer, CT angiogram negative for PE.  Procalcitonin was elevated initially, much higher on repeat labs on 03/30/2021.  Completed 5 days of Rocephin and Zithromax on 04/04/2021.  Essential hypertension: Continue amlodipine and hydralazine, blood pressure controlled.  Beta-blockers on hold.  ESRD on HD: Per nephrology.  HLD: Continue atorvastatin.   DM type II: Blood sugar still elevated.  Increase Semglee to 30 units and continue SSI.   Anemia of chronic disease: Stable.  DVT prophylaxis: heparin injection 5,000 Units Start: 03/26/21 2200 SCDs Start: 03/26/21 [id number]   Code Status: Full Code  Family Communication:  None present at bedside.  Plan of care discussed with patient in length and he verbalized understanding and agreed with it.  Status is: Inpatient  Remains inpatient appropriate because:Inpatient level of care appropriate due to severity of illness  Dispo: The patient is from: SNF              Anticipated d/c is to: SNF              Patient currently is not medically stable to d/c.   Difficult to place patient No   Estimated body mass index is 21.36 kg/m as calculated from the following:   Height as of 03/16/21: '5\' 3"'$  (1.6 m).   Weight as of this encounter: 54.7 kg.  Nutritional Assessment: Body mass index is 21.36 kg/m.Mar[place] Kitchen Seen by dietician.  I agree  with the assessment and plan as outlined below: Nutrition Status:  Skin Assessment: I have examined the patient's skin and I agree with the wound assessment as performed by the wound care RN as outlined below:    Consultants:  None  Procedures:  None  Antimicrobials:  Anti-infectives (From admission, onward)    Start     Dose/Rate Route Frequency Ordered Stop   03/30/21 1400  cefTRIAXone (ROCEPHIN) 2 g in sodium chloride 0.9 % 100 mL IVPB        2 g 200 mL/hr over 30 Minutes Intravenous Every 24 hours 03/30/21 1233 04/03/21  1742   03/30/21 1400  azithromycin (ZITHROMAX) 500 mg in sodium chloride 0.9 % 250 mL IVPB        500 mg 250 mL/hr over 60 Minutes Intravenous Every 24 hours 03/30/21 1233 04/03/21 1853   03/27/21 1000  remdesivir 100 mg in sodium chloride 0.9 % 100 mL IVPB       See Hyperspace for full Linked Orders Report.   100 mg 200 mL/hr over 30 Minutes Intravenous Daily 03/26/21 0921 03/31/21 0739   03/26/21 0930  remdesivir 200 mg in sodium chloride 0.9% 250 mL IVPB       See Hyperspace for full Linked Orders Report.   200 mg 580 mL/hr over 30 Minutes Intravenous Once 03/26/21 0921 03/26/21 1528   03/26/21 0715  cefTRIAXone (ROCEPHIN) 1 g in sodium chloride 0.9 % 100 mL IVPB        1 g 200 mL/hr over 30 Minutes Intravenous  Once 03/26/21 0703 03/26/21 0933          Subjective: Patient seen and examined.  Still requiring 20 L of oxygen with 50% FiO2.  Still denies any shortness of breath and looks comfortable.  Objective: Vitals:   04/04/21 0400 04/04/21 0404 04/04/21 0750 04/04/21 0757  BP: (!) 159/95   (!) 163/88  Pulse: 82 80 83 84  Resp: '13 12 16 15  '$ Temp: 98.7 F (37.1 C)   98.5 F (36.9 C)  TempSrc: Oral   Oral  SpO2: 100% 100% 100% 100%  Weight:        Intake/Output Summary (Last 24 hours) at 04/04/2021 1028 Last data filed at 04/04/2021 0959 Gross per 24 hour  Intake 120 ml  Output 3075 ml  Net -2955 ml    Filed Weights   03/31/21 1609 04/03/21 1312 04/03/21 1630  Weight: 56.2 kg 57.2 kg 54.7 kg    Examination:  General exam: Appears calm and comfortable  Respiratory system: Minich breath sounds bilaterally. Respiratory effort normal. Cardiovascular system: S1 & S2 heard, RRR. No JVD, murmurs, rubs, gallops or clicks. No pedal edema. Gastrointestinal system: Abdomen is nondistended, soft and nontender. No organomegaly or masses felt. Normal bowel sounds heard. Central nervous system: Alert and oriented. No focal neurological deficits. Extremities: Symmetric 5 x  5 power. Skin: No rashes, lesions or ulcers.  Psychiatry: Judgement and insight appear normal. Mood & affect appropriate.    Data Reviewed: I have personally reviewed following labs and imaging studies  CBC: Recent Labs  Lab 03/29/21 0638 03/30/21 0128 03/31/21 0236 03/31/21 1112 04/03/21 0208  WBC 10.8* 12.1* 13.3* 17.7* 16.2*  NEUTROABS 8.7* 10.6* 12.0*  --   --   HGB 11.0* 10.3* 11.0* 10.5* 9.5*  HCT 33.3* 31.7* 33.0* 31.2* 29.0*  MCV 84.7 84.8 84.2 83.0 84.1  PLT 131* 145* 81* 158 [id number]    Basic Metabolic Panel: Recent Labs  Lab 03/29/21 0638 03/30/21 0128  03/31/21 0236 03/31/21 1656 04/03/21 0208  NA 133* 131* 127* 126* 128*  K 3.6 3.9 4.2 4.6 4.4  CL 96* 94* 91* 89* 93*  CO2 '26 26 22 23 23  '$ GLUCOSE 112* 230* 283* 166* 300*  BUN 37* 55* 85* 91* 77*  CREATININE 3.79* 4.78* 5.73* 5.91* 5.91*  CALCIUM 8.3* 8.3* 8.5* 8.6* 8.4*  MG 2.2 2.3 2.6*  --   --   PHOS 3.5 2.8 2.3* 2.2* 3.5    GFR: Estimated Creatinine Clearance: 11.7 mL/min (A) (by C-G formula based on SCr of 5.91 mg/dL (H)). Liver Function Tests: Recent Labs  Lab 03/29/21 [id number] 03/30/21 0128 03/31/21 0236 03/31/21 1656 04/03/21 0208  AST '30 24 28  '$ --   --   ALT '14 13 25  '$ --   --   ALKPHOS 66 70 87  --   --   BILITOT 0.6 0.8 0.6  --   --   PROT 6.2* 6.3* 7.0  --   --   ALBUMIN 2.6* 2.7* 3.0* 2.9* 2.8*    No results for input(s): LIPASE, AMYLASE in the last 168 hours. No results for input(s): AMMONIA in the last 168 hours. Coagulation Profile: No results for input(s): INR, PROTIME in the last 168 hours. Cardiac Enzymes: No results for input(s): CKTOTAL, CKMB, CKMBINDEX, TROPONINI in the last 168 hours. BNP (last 3 results) No results for input(s): PROBNP in the last 8760 hours. HbA1C: No results for input(s): HGBA1C in the last 72 hours. CBG: Recent Labs  Lab 04/03/21 1706 04/03/21 2009 04/04/21 0133 04/04/21 0536 04/04/21 0851  GLUCAP 231* 366* 305* 152* 118*    Lipid  Profile: No results for input(s): CHOL, HDL, LDLCALC, TRIG, CHOLHDL, LDLDIRECT in the last 72 hours. Thyroid Function Tests: No results for input(s): TSH, T4TOTAL, FREET4, T3FREE, THYROIDAB in the last 72 hours. Anemia Panel: No results for input(s): VITAMINB12, FOLATE, FERRITIN, TIBC, IRON, RETICCTPCT in the last 72 hours.  Sepsis Labs: Recent Labs  Lab 03/30/21 0128  PROCALCITON 2.07     Recent Results (from the past 240 hour(s))  Resp Panel by RT-PCR (Flu A&B, Covid) Nasopharyngeal Swab     Status: Abnormal   Collection Time: 03/26/21  7:17 AM   Specimen: Nasopharyngeal Swab; Nasopharyngeal(NP) swabs in vial transport medium  Result Value Ref Range Status   SARS Coronavirus 2 by RT PCR POSITIVE (A) NEGATIVE Final    Comment: RESULT CALLED TO, READ BACK BY AND VERIFIED WITH: C LOZER RN 03/26/21 0904 JDW (NOTE) SARS-CoV-2 target nucleic acids are DETECTED.  The SARS-CoV-2 RNA is generally detectable in upper respiratory specimens during the acute phase of infection. Positive results are indicative of the presence of the identified virus, but do not rule out bacterial infection or co-infection with other pathogens not detected by the test. Clinical correlation with patient history and other diagnostic information is necessary to determine patient infection status. The expected result is Negative.  Fact Sheet for Patients: EntrepreneurPulse.com.au  Fact Sheet for Healthcare Providers: IncredibleEmployment.be  This test is not yet approved or cleared by the [place] FDA and  has been authorized for detection and/or diagnosis of SARS-CoV-2 by FDA under an Emergency Use Authorization (EUA).  This EUA will remain in effect (meaning this test can be used ) for the duration of  the COVID-19 declaration under Section 564(b)(1) of the Act, 21 U.S.C. section 360bbb-3(b)(1), unless the authorization is terminated or revoked sooner.      Influenza A by PCR NEGATIVE NEGATIVE Final  Influenza B by PCR NEGATIVE NEGATIVE Final    Comment: (NOTE) The Xpert Xpress SARS-CoV-2/FLU/RSV plus assay is intended as an aid in the diagnosis of influenza from Nasopharyngeal swab specimens and should not be used as a sole basis for treatment. Nasal washings and aspirates are unacceptable for Xpert Xpress SARS-CoV-2/FLU/RSV testing.  Fact Sheet for Patients: EntrepreneurPulse.com.au  Fact Sheet for Healthcare Providers: IncredibleEmployment.be  This test is not yet approved or cleared by the [place] FDA and has been authorized for detection and/or diagnosis of SARS-CoV-2 by FDA under an Emergency Use Authorization (EUA). This EUA will remain in effect (meaning this test can be used) for the duration of the COVID-19 declaration under Section 564(b)(1) of the Act, 21 U.S.C. section 360bbb-3(b)(1), unless the authorization is terminated or revoked.  Performed at [facility], [place] [address], [place], [place] [address]   MRSA Next Gen by PCR, Nasal     Status: Abnormal   Collection Time: 03/26/21  2:33 PM   Specimen: Nasal Mucosa; Nasal Swab  Result Value Ref Range Status   MRSA by PCR Next Gen DETECTED (A) NOT DETECTED Final    Comment: RESULT CALLED TO, READ BACK BY AND VERIFIED WITH: JAMIE COLE RN 03/26/21 '@1738'$  BY JW  (NOTE) The GeneXpert MRSA Assay (FDA approved for NASAL specimens only), is one component of a comprehensive MRSA colonization surveillance program. It is not intended to diagnose MRSA infection nor to guide or monitor treatment for MRSA infections. Test performance is not FDA approved in patients less than 38 years old. Performed at [facility], [place] [address], [place], [place] [address]        Radiology Studies: DG Chest Port 1 View  Result Date: 04/03/2021 CLINICAL DATA:  Hypoxia EXAM: PORTABLE CHEST 1 VIEW COMPARISON:  October 11th a fourteenth  2022 FINDINGS: The cardiomediastinal silhouette is unchanged in contour.Clips project over the RIGHT axilla. Small to moderate bilateral pleural effusions, similar in comparison to prior no pneumothorax. Improvement in aeration of the RIGHT lung in comparison to March 27, 2021 with some persistent hazy opacities diffusely. Persistent hazy basilar predominant airspace opacities of the LEFT lung. Visualized abdomen is unremarkable. IMPRESSION: 1. Persistent but mildly improved appearance of bilateral LEFT greater than RIGHT hazy opacities consistent with the sequela of COVID-19 infection. 2. Small moderate bilateral pleural effusions. Electronically Signed   By: Valentino Saxon M.D.   On: 04/03/2021 13:37    Scheduled Meds:  acetaminophen  1,000 mg Oral Once   amLODipine  10 mg Oral Daily   aspirin  81 mg Oral Daily   atorvastatin  40 mg Oral Daily   brimonidine  1 drop Left Eye BID   Chlorhexidine Gluconate Cloth  6 each Topical Q0600   Chlorhexidine Gluconate Cloth  6 each Topical Q0600   docusate sodium  100 mg Oral BID   dorzolamide-timolol  1 drop Left Eye BID   heparin injection (subcutaneous)  5,000 Units Subcutaneous Q8H   hydrALAZINE  25 mg Oral Q8H   insulin aspart  0-6 Units Subcutaneous Q4H   insulin glargine-yfgn  30 Units Subcutaneous Daily   latanoprost  1 drop Left Eye QHS   mupirocin ointment   Nasal BID   olopatadine  2 drop Both Eyes BID   polyethylene glycol  17 g Oral Daily   polyvinyl alcohol  1 drop Both Eyes TID   predniSONE  50 mg Oral Daily   pregabalin  25 mg Oral BID   senna  1 tablet Oral QHS   sertraline  25 mg Oral Daily   Continuous Infusions:  sodium chloride     sodium chloride        LOS: 9 days   Time spent: 25 minutes   Darliss Cheney, MD [facility]  04/04/2021, 10:28 AM  Please page via Shea [place] and do not message via secure chat for anything urgent. Secure chat can be used for anything non urgent.  How to contact the [facility]  Attending or Consulting provider [place] or covering provider during after hours [place], for this patient?  Check the care team in [facility] and look for a) attending/consulting TR[facility] provider listed and b) the [facility] team listed. Page or secure chat [phone]. Log into [email or web address] and use [place]'s universal password to access. If you do not have the password, please contact the hospital operator. Locate the [facility] provider you are looking for under [facility] and page to a number that you can be directly reached. If you still have difficulty reaching the provider, please page the [facility] (Director on Call) for the Hospitalists listed on amion for assistance.

## 2021-04-05 DIAGNOSIS — J9601 Acute respiratory failure with hypoxia: Secondary | ICD-10-CM | POA: Diagnosis not present

## 2021-04-05 LAB — CBC
HCT: 28.4 % — ABNORMAL LOW (ref 39.0–52.0)
Hemoglobin: 9.3 g/dL — ABNORMAL LOW (ref 13.0–17.0)
MCH: 28.2 pg (ref 26.0–34.0)
MCHC: 32.7 g/dL (ref 30.0–36.0)
MCV: 86.1 fL (ref 80.0–100.0)
Platelets: 220 10*3/uL (ref 150–400)
RBC: 3.3 MIL/uL — ABNORMAL LOW (ref 4.22–5.81)
RDW: 15.4 % (ref 11.5–15.5)
WBC: 17.9 10*3/uL — ABNORMAL HIGH (ref 4.0–10.5)
nRBC: 0 % (ref 0.0–0.2)

## 2021-04-05 LAB — RENAL FUNCTION PANEL
Albumin: 2.9 g/dL — ABNORMAL LOW (ref 3.5–5.0)
Anion gap: 13 (ref 5–15)
BUN: 57 mg/dL — ABNORMAL HIGH (ref 6–20)
CO2: 23 mmol/L (ref 22–32)
Calcium: 8.7 mg/dL — ABNORMAL LOW (ref 8.9–10.3)
Chloride: 94 mmol/L — ABNORMAL LOW (ref 98–111)
Creatinine, Ser: 4.59 mg/dL — ABNORMAL HIGH (ref 0.61–1.24)
GFR, Estimated: 15 mL/min — ABNORMAL LOW (ref >60)
Glucose, Bld: 263 mg/dL — ABNORMAL HIGH (ref 70–99)
Phosphorus: 3.2 mg/dL (ref 2.5–4.6)
Potassium: 3.8 mmol/L (ref 3.5–5.1)
Sodium: 130 mmol/L — ABNORMAL LOW (ref 135–145)

## 2021-04-05 LAB — GLUCOSE, CAPILLARY
Glucose-Capillary: 119 mg/dL — ABNORMAL HIGH (ref 70–99)
Glucose-Capillary: 149 mg/dL — ABNORMAL HIGH (ref 70–99)
Glucose-Capillary: 171 mg/dL — ABNORMAL HIGH (ref 70–99)
Glucose-Capillary: 190 mg/dL — ABNORMAL HIGH (ref 70–99)
Glucose-Capillary: 283 mg/dL — ABNORMAL HIGH (ref 70–99)
Glucose-Capillary: 419 mg/dL — ABNORMAL HIGH (ref 70–99)
Glucose-Capillary: 77 mg/dL (ref 70–99)

## 2021-04-05 MED ORDER — CHLORHEXIDINE GLUCONATE CLOTH 2 % EX PADS: 6.0000 | MEDICATED_PAD | Freq: Every day | CUTANEOUS | Status: DC

## 2021-04-05 MED ORDER — DARBEPOETIN ALFA 60 MCG/0.3ML IJ SOSY
60.0000 ug | PREFILLED_SYRINGE | INTRAMUSCULAR | Status: DC
  Administered 2021-04-06: 60 ug via INTRAVENOUS
  Filled 2021-04-05 (×2): qty 0.3

## 2021-04-05 NOTE — Progress Notes (Signed)
PROGRESS NOTE    Christopher Davila  [id number] DOB: 01/08/1971 DOA: 03/26/2021 PCP: [name], MD   Brief Narrative:  50 y/o M who presented to [facility] on 10/10 with reports of shortness of breath.    The patient lives in a facility and is baseline 3L O2 dependent.  He is HD dependent M/W/F.  The facility [facility]) called EMS for shortness of breath. He normally wears 3L oxygen, oxygen sats were in the 60's and was increased to 5L at the facility without improvement. EMS was called and confirmed hypoxia.  The patient was COVID positive 2 days prior to admit (10/8) at the facility.  On arrival to the ER, he failed salter O2 and was placed on CPAP without improvement.  He ultimately was placed on BiPAP with improvement in saturations and work of breathing.  His last HD session was on Friday 10/7.  EKG showed ST.  He was hypertensive and exam suggestive of volume overload.  CXR showed chronic left pleural effusion.  The patient pulled his biPAP off and desaturated into the 50's.  He was treated empirically with rocephin.  COVID status was confirmed positive, flu negative. Initial labs showed K 5.2, glucose 120, BUN 43, sr cr 6.10, AG 17, troponin 29, WBC 11.4, Hgb 11.7 and platelets 165.     PCCM called for ICU admission.  Transferred under [facility] on 03/29/2021.  Further hospitalization course as below.  Assessment & Plan:   Active Problems:   Acute respiratory failure with hypoxia (HCC)   History of thoracentesis   S/P thoracentesis   Acute on chronic hypoxic Respiratory Failure/COVID-19 pneumonitis/pulmonary edema/fluid overload: Multifactorial,  still on 20 L of high flow oxygen but despite of that, he denies any shortness of breath and looks very comfortable without any dyspnea or tachypnea..  Continue dialysis to remove fluid.  Completed 5/5 days of remdesivir on 03/31/2019.  Continue prednisone.  Inflammatory markers improving.  Slight improvement in oxygenation compared to yesterday.   Now on 30% FiO2 compared to 40% that he was yesterday but still requiring 20 L.  Remains asymptomatic and comfortable.  Patient was encouraged to prone, out of bed to chair, to use incentive spirometry and flutter valve.  Significantly elevated D-dimer, CT angiogram negative for PE.  Completed 5 days of Rocephin and Zithromax on 04/04/2021.  Essential hypertension: Continue amlodipine and hydralazine, blood pressure controlled.  Beta-blockers on hold.  ESRD on HD: Per nephrology.  HLD: Continue atorvastatin.   DM type II: Blood sugar now fairly controlled, continue Semglee 30 units and SSI.   Anemia of chronic disease: Stable.  DVT prophylaxis: heparin injection 5,000 Units Start: 03/26/21 2200 SCDs Start: 03/26/21 [id number]   Code Status: Full Code  Family Communication:  None present at bedside.  Plan of care discussed with patient in length and he verbalized understanding and agreed with it.  Status is: Inpatient  Remains inpatient appropriate because:Inpatient level of care appropriate due to severity of illness  Dispo: The patient is from: SNF              Anticipated d/c is to: SNF              Patient currently is not medically stable to d/c.   Difficult to place patient No   Estimated body mass index is 19.72 kg/m as calculated from the following:   Height as of 03/16/21: '5\' 3"'$  (1.6 m).   Weight as of this encounter: 50.5 kg.  Nutritional Assessment: Body mass  index is 19.72 kg/m.Marland Kitchen Seen by dietician.  I agree with the assessment and plan as outlined below: Nutrition Status:  Skin Assessment: I have examined the patient's skin and I agree with the wound assessment as performed by the wound care RN as outlined below:    Consultants:  Nephrology  Procedures:  None  Antimicrobials:  Anti-infectives (From admission, onward)    Start     Dose/Rate Route Frequency Ordered Stop   03/30/21 1400  cefTRIAXone (ROCEPHIN) 2 g in sodium chloride 0.9 % 100 mL IVPB        2  g 200 mL/hr over 30 Minutes Intravenous Every 24 hours 03/30/21 1233 04/03/21 1742   03/30/21 1400  azithromycin (ZITHROMAX) 500 mg in sodium chloride 0.9 % 250 mL IVPB        500 mg 250 mL/hr over 60 Minutes Intravenous Every 24 hours 03/30/21 1233 04/03/21 1853   03/27/21 1000  remdesivir 100 mg in sodium chloride 0.9 % 100 mL IVPB       See Hyperspace for full Linked Orders Report.   100 mg 200 mL/hr over 30 Minutes Intravenous Daily 03/26/21 0921 03/31/21 0739   03/26/21 0930  remdesivir 200 mg in sodium chloride 0.9% 250 mL IVPB       See Hyperspace for full Linked Orders Report.   200 mg 580 mL/hr over 30 Minutes Intravenous Once 03/26/21 0921 03/26/21 1528   03/26/21 0715  cefTRIAXone (ROCEPHIN) 1 g in sodium chloride 0.9 % 100 mL IVPB        1 g 200 mL/hr over 30 Minutes Intravenous  Once 03/26/21 0703 03/26/21 0933          Subjective: Seen and examined.  He has no complaints.  Still on 20 L but 30% FiO2.  Objective: Vitals:   04/05/21 0437 04/05/21 0626 04/05/21 0700 04/05/21 0934  BP:   (!) 183/87   Pulse:   83   Resp:   20   Temp:   99.1 F (37.3 C)   TempSrc:   Oral   SpO2:  98% 97% 91%  Weight: 50.5 kg       Intake/Output Summary (Last 24 hours) at 04/05/2021 1018 Last data filed at 04/05/2021 0238 Gross per 24 hour  Intake 240 ml  Output 2625 ml  Net -2385 ml    Filed Weights   04/03/21 1630 04/04/21 2305 04/05/21 0437  Weight: 54.7 kg 55.2 kg 50.5 kg    Examination:  General exam: Appears calm and comfortable, legally blind Respiratory system: Diminished breath sounds bilaterally, respiratory effort normal. Cardiovascular system: S1 & S2 heard, RRR. No JVD, murmurs, rubs, gallops or clicks. No pedal edema. Gastrointestinal system: Abdomen is nondistended, soft and nontender. No organomegaly or masses felt. Normal bowel sounds heard. Central nervous system: Alert and oriented. No focal neurological deficits. Extremities: Symmetric 5 x 5  power. Skin: No rashes, lesions or ulcers.  Psychiatry: Judgement and insight appear poor   Data Reviewed: I have personally reviewed following labs and imaging studies  CBC: Recent Labs  Lab 03/30/21 0128 03/31/21 0236 03/31/21 1112 04/03/21 0208 04/05/21 0317  WBC 12.1* 13.3* 17.7* 16.2* 17.9*  NEUTROABS 10.6* 12.0*  --   --   --   HGB 10.3* 11.0* 10.5* 9.5* 9.3*  HCT 31.7* 33.0* 31.2* 29.0* 28.4*  MCV 84.8 84.2 83.0 84.1 86.1  PLT 145* 81* 158 171 [id number]    Basic Metabolic Panel: Recent Labs  Lab 03/30/21 0128 03/31/21 0236 03/31/21 1656 04/03/21  0208 04/05/21 0132  NA 131* 127* 126* 128* 130*  K 3.9 4.2 4.6 4.4 3.8  CL 94* 91* 89* 93* 94*  CO2 '26 22 23 23 23  '$ GLUCOSE 230* 283* 166* 300* 263*  BUN 55* 85* 91* 77* 57*  CREATININE 4.78* 5.73* 5.91* 5.91* 4.59*  CALCIUM 8.3* 8.5* 8.6* 8.4* 8.7*  MG 2.3 2.6*  --   --   --   PHOS 2.8 2.3* 2.2* 3.5 3.2    GFR: Estimated Creatinine Clearance: 13.9 mL/min (A) (by C-G formula based on SCr of 4.59 mg/dL (H)). Liver Function Tests: Recent Labs  Lab 03/30/21 0128 03/31/21 0236 03/31/21 1656 04/03/21 0208 04/05/21 0132  AST 24 28  --   --   --   ALT 13 25  --   --   --   ALKPHOS 70 87  --   --   --   BILITOT 0.8 0.6  --   --   --   PROT 6.3* 7.0  --   --   --   ALBUMIN 2.7* 3.0* 2.9* 2.8* 2.9*    No results for input(s): LIPASE, AMYLASE in the last 168 hours. No results for input(s): AMMONIA in the last 168 hours. Coagulation Profile: No results for input(s): INR, PROTIME in the last 168 hours. Cardiac Enzymes: No results for input(s): CKTOTAL, CKMB, CKMBINDEX, TROPONINI in the last 168 hours. BNP (last 3 results) No results for input(s): PROBNP in the last 8760 hours. HbA1C: No results for input(s): HGBA1C in the last 72 hours. CBG: Recent Labs  Lab 04/04/21 1626 04/04/21 2042 04/05/21 0123 04/05/21 0436 04/05/21 0806  GLUCAP 200* 260* 171* 119* 77    Lipid Profile: No results for input(s): CHOL,  HDL, LDLCALC, TRIG, CHOLHDL, LDLDIRECT in the last 72 hours. Thyroid Function Tests: No results for input(s): TSH, T4TOTAL, FREET4, T3FREE, THYROIDAB in the last 72 hours. Anemia Panel: No results for input(s): VITAMINB12, FOLATE, FERRITIN, TIBC, IRON, RETICCTPCT in the last 72 hours.  Sepsis Labs: Recent Labs  Lab 03/30/21 0128  PROCALCITON 2.07     Recent Results (from the past 240 hour(s))  MRSA Next Gen by PCR, Nasal     Status: Abnormal   Collection Time: 03/26/21  2:33 PM   Specimen: Nasal Mucosa; Nasal Swab  Result Value Ref Range Status   MRSA by PCR Next Gen DETECTED (A) NOT DETECTED Final    Comment: RESULT CALLED TO, READ BACK BY AND VERIFIED WITH: JAMIE COLE RN 03/26/21 '@1738'$  BY JW  (NOTE) The GeneXpert MRSA Assay (FDA approved for NASAL specimens only), is one component of a comprehensive MRSA colonization surveillance program. It is not intended to diagnose MRSA infection nor to guide or monitor treatment for MRSA infections. Test performance is not FDA approved in patients less than 61 years old. Performed at [facility], [place] [address], [place], [place] [address]        Radiology Studies: DG Chest Port 1 View  Result Date: 04/03/2021 CLINICAL DATA:  Hypoxia EXAM: PORTABLE CHEST 1 VIEW COMPARISON:  October 11th a fourteenth 2022 FINDINGS: The cardiomediastinal silhouette is unchanged in contour.Clips project over the RIGHT axilla. Small to moderate bilateral pleural effusions, similar in comparison to prior no pneumothorax. Improvement in aeration of the RIGHT lung in comparison to March 27, 2021 with some persistent hazy opacities diffusely. Persistent hazy basilar predominant airspace opacities of the LEFT lung. Visualized abdomen is unremarkable. IMPRESSION: 1. Persistent but mildly improved appearance of bilateral LEFT greater  than RIGHT hazy opacities consistent with the sequela of COVID-19 infection. 2. Small moderate bilateral pleural effusions.  Electronically Signed   By: Valentino Saxon M.D.   On: 04/03/2021 13:37    Scheduled Meds:  acetaminophen  1,000 mg Oral Once   amLODipine  10 mg Oral Daily   aspirin  81 mg Oral Daily   atorvastatin  40 mg Oral Daily   brimonidine  1 drop Left Eye BID   Chlorhexidine Gluconate Cloth  6 each Topical Q0600   Chlorhexidine Gluconate Cloth  6 each Topical Q0600   docusate sodium  100 mg Oral BID   dorzolamide-timolol  1 drop Left Eye BID   heparin injection (subcutaneous)  5,000 Units Subcutaneous Q8H   hydrALAZINE  25 mg Oral Q8H   insulin aspart  0-6 Units Subcutaneous Q4H   insulin glargine-yfgn  30 Units Subcutaneous Daily   latanoprost  1 drop Left Eye QHS   mupirocin ointment   Nasal BID   olopatadine  2 drop Both Eyes BID   polyethylene glycol  17 g Oral Daily   polyvinyl alcohol  1 drop Both Eyes TID   predniSONE  50 mg Oral Daily   pregabalin  25 mg Oral BID   senna  1 tablet Oral QHS   sertraline  25 mg Oral Daily   Continuous Infusions:  sodium chloride     sodium chloride        LOS: 10 days   Time spent: 24 minutes   Darliss Cheney, MD [facility]  04/05/2021, 10:18 AM  Please page via [facility] and do not message via secure chat for anything urgent. Secure chat can be used for anything non urgent.  How to contact the [facility] Attending or Consulting provider [place] or covering provider during after hours [place], for this patient?  Check the care team in [facility] and look for a) attending/consulting TR[facility] provider listed and b) the [facility] team listed. Page or secure chat [phone]. Log into [email or web address] and use [place]'s universal password to access. If you do not have the password, please contact the hospital operator. Locate the [facility] provider you are looking for under [facility] and page to a number that you can be directly reached. If you still have difficulty reaching the provider, please page the [facility] (Director on Call) for the Hospitalists listed on amion  for assistance.

## 2021-04-05 NOTE — Progress Notes (Signed)
Patient's fistula deaccessed sites were bleeding, patient's RN put on the consult for it. Explained RN that need to do manually pressure bleeding area for 15 to 20 mins. Patient's RN put on the 4x4 dressing at top deaccessed area, and bleeding due to no pressure dressing. Pressed for 20 min. Small amount still oozing. Applied pressure dressing and patient's RN to check frequently any change and manually pressure one more time, if still bleeding. RN understood it. HS [employer]

## 2021-04-05 NOTE — Progress Notes (Signed)
[facility] Progress Note   Subjective:   Feeling well, says dialysis went well yesterday. Continues to deny SOB.  Objective Vitals:   04/05/21 0437 04/05/21 0626 04/05/21 0700 04/05/21 0934  BP:   (!) 183/87   Pulse:   83   Resp:   20   Temp:   99.1 F (37.3 C)   TempSrc:   Oral   SpO2:  98% 97% 91%  Weight: 50.5 kg      Physical Exam General: WDWN male, alert and in NAD Heart: RRR, no murmur Lungs: CTA bilaterally with diminished breath sounds bilaterally on HFNC Abdomen: Soft, non-tender, non-distended, +BS Extremities: No edema b/l lower extremities Dialysis Access: RUE AVT + bruit    Additional Objective Labs: Basic Metabolic Panel: Recent Labs  Lab 03/31/21 1656 04/03/21 0208 04/05/21 0132  NA 126* 128* 130*  K 4.6 4.4 3.8  CL 89* 93* 94*  CO2 '23 23 23  '$ GLUCOSE 166* 300* 263*  BUN 91* 77* 57*  CREATININE 5.91* 5.91* 4.59*  CALCIUM 8.6* 8.4* 8.7*  PHOS 2.2* 3.5 3.2   Liver Function Tests: Recent Labs  Lab 03/30/21 0128 03/31/21 0236 03/31/21 1656 04/03/21 0208 04/05/21 0132  AST 24 28  --   --   --   ALT 13 25  --   --   --   ALKPHOS 70 87  --   --   --   BILITOT 0.8 0.6  --   --   --   PROT 6.3* 7.0  --   --   --   ALBUMIN 2.7* 3.0* 2.9* 2.8* 2.9*   No results for input(s): LIPASE, AMYLASE in the last 168 hours. CBC: Recent Labs  Lab 03/30/21 0128 03/31/21 0236 03/31/21 1112 04/03/21 0208 04/05/21 0317  WBC 12.1* 13.3* 17.7* 16.2* 17.9*  NEUTROABS 10.6* 12.0*  --   --   --   HGB 10.3* 11.0* 10.5* 9.5* 9.3*  HCT 31.7* 33.0* 31.2* 29.0* 28.4*  MCV 84.8 84.2 83.0 84.1 86.1  PLT 145* 81* 158 171 220   Blood Culture    Component Value Date/Time   SDES BLOOD RIGHT HAND 10/27/2019 1208   SDES BLOOD RIGHT HAND 10/27/2019 1208   SPECREQUEST  10/27/2019 1208    BOTTLES DRAWN AEROBIC AND ANAEROBIC Blood Culture adequate volume   SPECREQUEST  10/27/2019 1208    BOTTLES DRAWN AEROBIC AND ANAEROBIC Blood Culture adequate  volume   CULT  10/27/2019 1208    NO GROWTH 5 DAYS Performed at [facility], [place] [address] [place], [place] [address]    CULT  10/27/2019 1208    NO GROWTH 5 DAYS Performed at [facility], [place] [address] [place], [place] [address]    REPTSTATUS 11/01/2019 FINAL 10/27/2019 1208   REPTSTATUS 11/01/2019 FINAL 10/27/2019 1208    Cardiac Enzymes: No results for input(s): CKTOTAL, CKMB, CKMBINDEX, TROPONINI in the last 168 hours. CBG: Recent Labs  Lab 04/04/21 1626 04/04/21 2042 04/05/21 0123 04/05/21 0436 04/05/21 0806  GLUCAP 200* 260* 171* 119* 77   Iron Studies: No results for input(s): IRON, TIBC, TRANSFERRIN, FERRITIN in the last 72 hours. '@[id number]'$ @ Studies/Results: No results found. Medications:  sodium chloride     sodium chloride      acetaminophen  1,000 mg Oral Once   amLODipine  10 mg Oral Daily   aspirin  81 mg Oral Daily   atorvastatin  40 mg Oral Daily   brimonidine  1 drop Left Eye  BID   Chlorhexidine Gluconate Cloth  6 each Topical Q0600   Chlorhexidine Gluconate Cloth  6 each Topical Q0600   docusate sodium  100 mg Oral BID   dorzolamide-timolol  1 drop Left Eye BID   heparin injection (subcutaneous)  5,000 Units Subcutaneous Q8H   hydrALAZINE  25 mg Oral Q8H   insulin aspart  0-6 Units Subcutaneous Q4H   insulin glargine-yfgn  30 Units Subcutaneous Daily   latanoprost  1 drop Left Eye QHS   mupirocin ointment   Nasal BID   olopatadine  2 drop Both Eyes BID   polyethylene glycol  17 g Oral Daily   polyvinyl alcohol  1 drop Both Eyes TID   predniSONE  50 mg Oral Daily   pregabalin  25 mg Oral BID   senna  1 tablet Oral QHS   sertraline  25 mg Oral Daily    Dialysis Orders: Outpatient orders: [facility], MWF, 4 hrs, F180, 2k, 2cal, edw 53kg, no heparin  Assessment/Plan: 1) ESRD:  Intermittent HD refusal, now back on trac. Was feeling poorly for last hour of HD but is tolerating it better this week. HD again tomorrow.  2)   AHRF COVID positive 03/24/2021 Pulmonary edema, left pleural effusion - completed 4d course of remdesivir per primary service, on pred -thora 10/10: 1200cc drained 3)  Hyponatremia -likely hypervolemic, improving with HD 4)  Volume/ hypertension: EDW 53kg.- now under his EDW. UF as tolerated, See above.  5)  Anemia of Chronic Kidney Disease: Hemoglobin 9.3, Receiving Mircera 84mg qtreatment, last dose 10/3 as an outpatient. Will order ESA with HD tomorrow. 6) Secondary Hyperparathyroidism/Hyperphosphatemia: Calcium controlled. Phos running low, discontinuing his binders for now.  8) Additional recommendations: - Dose all meds for creatinine clearance < 10 ml/min  - Unless absolutely necessary, no MRIs with gadolinium.  -  No blood pressure measurements in rt arm. - If blood transfusion is requested during hemodialysis sessions, please alert uKoreaprior to the session.   SAnice Paganini PA-C 04/05/2021, 12:46 PM  [facility] Pager: [phone]

## 2021-04-05 NOTE — Progress Notes (Signed)
RT to assess patient on HFNC. Patient asleep with cannula out of nose. Patient 02 sat on room air between 94%-95%. RT turned HHFNC off. Patient is tolerating well. RT will continue to monitor.

## 2021-04-05 NOTE — Progress Notes (Signed)
Inpatient Diabetes Program Recommendations  AACE/ADA: New Consensus Statement on Inpatient Glycemic Control   Target Ranges:  Prepandial:   less than 140 mg/dL      Peak postprandial:   less than 180 mg/dL (1-2 hours)      Critically ill patients:  140 - 180 mg/dL   Results for MEKAI, KORINEK (MRN [id number]) as of 04/05/2021 11:47  Ref. Range 04/04/2021 08:51 04/04/2021 11:57 04/04/2021 16:26 04/04/2021 20:42 04/05/2021 01:23 04/05/2021 04:36 04/05/2021 08:06  Glucose-Capillary Latest Ref Range: 70 - 99 mg/dL 118 (H) 153 (H) 200 (H) 260 (H) 171 (H) 119 (H) 77   Review of Glycemic Control  Current orders for Inpatient glycemic control: Semglee 30 units daily, Novolog 0-6 units Q4H; Prednisone 50 daily  Inpatient Diabetes Program Recommendations:    Insulin: Fasting glucose 77 mg/dl today and Semglee 30 units already given today. May want to consider decreasing Semglee to 27 units daily.  Thanks, Barnie Alderman, RN, MSN, CDE Diabetes Coordinator Inpatient Diabetes Program [phone] (Team Pager from 8am to 5pm)

## 2021-04-06 DIAGNOSIS — N186 End stage renal disease: Secondary | ICD-10-CM | POA: Diagnosis not present

## 2021-04-06 DIAGNOSIS — Z992 Dependence on renal dialysis: Secondary | ICD-10-CM | POA: Diagnosis not present

## 2021-04-06 DIAGNOSIS — J9601 Acute respiratory failure with hypoxia: Secondary | ICD-10-CM | POA: Diagnosis not present

## 2021-04-06 DIAGNOSIS — Z9889 Other specified postprocedural states: Secondary | ICD-10-CM | POA: Diagnosis not present

## 2021-04-06 DIAGNOSIS — R58 Hemorrhage, not elsewhere classified: Secondary | ICD-10-CM

## 2021-04-06 LAB — RENAL FUNCTION PANEL
Albumin: 3.2 g/dL — ABNORMAL LOW (ref 3.5–5.0)
Anion gap: 9 (ref 5–15)
BUN: 19 mg/dL (ref 6–20)
CO2: 27 mmol/L (ref 22–32)
Calcium: 8.6 mg/dL — ABNORMAL LOW (ref 8.9–10.3)
Chloride: 97 mmol/L — ABNORMAL LOW (ref 98–111)
Creatinine, Ser: 2.06 mg/dL — ABNORMAL HIGH (ref 0.61–1.24)
GFR, Estimated: 39 mL/min — ABNORMAL LOW (ref >60)
Glucose, Bld: 144 mg/dL — ABNORMAL HIGH (ref 70–99)
Phosphorus: 1.5 mg/dL — ABNORMAL LOW (ref 2.5–4.6)
Potassium: 3.8 mmol/L (ref 3.5–5.1)
Sodium: 133 mmol/L — ABNORMAL LOW (ref 135–145)

## 2021-04-06 LAB — GLUCOSE, CAPILLARY
Glucose-Capillary: 280 mg/dL — ABNORMAL HIGH (ref 70–99)
Glucose-Capillary: 204 mg/dL — ABNORMAL HIGH (ref 70–99)
Glucose-Capillary: 44 mg/dL — CL (ref 70–99)
Glucose-Capillary: 164 mg/dL — ABNORMAL HIGH (ref 70–99)
Glucose-Capillary: 206 mg/dL — ABNORMAL HIGH (ref 70–99)
Glucose-Capillary: 167 mg/dL — ABNORMAL HIGH (ref 70–99)
Glucose-Capillary: 144 mg/dL — ABNORMAL HIGH (ref 70–99)

## 2021-04-06 LAB — CBC
HCT: 29.1 % — ABNORMAL LOW (ref 39.0–52.0)
Hemoglobin: 9.7 g/dL — ABNORMAL LOW (ref 13.0–17.0)
MCH: 27.9 pg (ref 26.0–34.0)
MCHC: 33.3 g/dL (ref 30.0–36.0)
MCV: 83.6 fL (ref 80.0–100.0)
Platelets: 245 10*3/uL (ref 150–400)
RBC: 3.48 MIL/uL — ABNORMAL LOW (ref 4.22–5.81)
RDW: 15.3 % (ref 11.5–15.5)
WBC: 22 10*3/uL — ABNORMAL HIGH (ref 4.0–10.5)
nRBC: 0 % (ref 0.0–0.2)

## 2021-04-06 MED ORDER — "THROMBI-PAD 3""X3"" EX PADS"
1.0000 | MEDICATED_PAD | Freq: Once | CUTANEOUS | Status: AC
  Administered 2021-04-06: 1 via TOPICAL
  Filled 2021-04-06: qty 1

## 2021-04-06 MED ORDER — INSULIN ASPART 100 UNIT/ML IJ SOLN
2.0000 [IU] | Freq: Once | INTRAMUSCULAR | Status: AC
  Administered 2021-04-06: 2 [IU] via SUBCUTANEOUS

## 2021-04-06 NOTE — Progress Notes (Signed)
Cross-coverage note:   Patient seen for bleeding from AV fistula in right arm. There is some oozing from access site, no ulceration or spurting. There has reportedly been some bleeding since dialysis yesterday and pressure had been held for 15-20 minutes twice since then. Discussed with RN recommendation for topical thrombin pad, continued monitoring.

## 2021-04-06 NOTE — Progress Notes (Signed)
Inpatient Diabetes Program Recommendations  AACE/ADA: New Consensus Statement on Inpatient Glycemic Control  Target Ranges:  Prepandial:   less than 140 mg/dL      Peak postprandial:   less than 180 mg/dL (1-2 hours)      Critically ill patients:  140 - 180 mg/dL   Results for JAMALL, STROHMEIER (MRN [id number]) as of 04/06/2021 09:51  Ref. Range 04/05/2021 23:42 04/06/2021 01:53 04/06/2021 03:52 04/06/2021 08:26 04/06/2021 09:48  Glucose-Capillary Latest Ref Range: 70 - 99 mg/dL 419 (H)  Novolog 6 units@ 00:05 280 (H)  Novolog 2 units @ 2:10 204 (H)  Novolog 2 units @ 3:57 44 (LL) 164 (H)   Results for KAMUELA, MAGOS (MRN [id number]) as of 04/06/2021 09:51  Ref. Range 04/05/2021 08:06 04/05/2021 13:20 04/05/2021 17:50 04/05/2021 19:58  Glucose-Capillary Latest Ref Range: 70 - 99 mg/dL 77   Semglee 30 units @ 9:32 149 (H) 190 (H)  Novolog 1 unit 283 (H)  Novolog 3 units   Review of Glycemic Control  Current orders for Inpatient glycemic control: Semglee 30 units daily, Novolog 0-6 units Q4H; Prednisone 50 mg daily  Inpatient Diabetes Program Recommendations:    Insulin:  Glucose up to 419 mg/dl last night and patient received a total of Novolog 10 units (6 units at 00:05, 2 units at 2:10 and 2 units at 3:57am). Novolog correction is ordered Q4H so not sure why pt received Novolog correction 3 times within 4 hour time frame. Noted glucose 44 this morning; likely due to Novolog. Since fasting glucose was 77 mg/dl yesterday, would recommend decreasing Semglee to 27 units daily (to start today) and adding Novolog 3 units TID with meals (if steroids continued).   Thanks, Christopher Alderman, RN, MSN, CDE Diabetes Coordinator Inpatient Diabetes Program [phone] (Team Pager from 8am to 5pm)

## 2021-04-06 NOTE — Progress Notes (Signed)
Patient with complaints of being hot. Blood sugar has been obtained and bp within normal limits.

## 2021-04-06 NOTE — Progress Notes (Signed)
Labs drawn prior to dialysis. Contacted lab. Lab states blood received, but no orders for blood work. Orders placed for stat cbc and renal function panel per hemodialysis treatment protocol.

## 2021-04-06 NOTE — Progress Notes (Signed)
UF remains off due to cramping.

## 2021-04-06 NOTE — Progress Notes (Signed)
PROGRESS NOTE    Christopher Davila  SWF:[id number] DOB: July 26, 1970 DOA: 03/26/2021 PCP: [name], MD   Brief Narrative:  50 y/o M who presented to [facility] on 10/10 with reports of shortness of breath.    The patient lives in a facility and is baseline 3L O2 dependent.  He is HD dependent M/W/F.  The facility [facility]) called EMS for shortness of breath. He normally wears 3L oxygen, oxygen sats were in the 60's and was increased to 5L at the facility without improvement. EMS was called and confirmed hypoxia.  The patient was COVID positive 2 days prior to admit (10/8) at the facility.  On arrival to the ER, he failed salter O2 and was placed on CPAP without improvement.  He ultimately was placed on BiPAP with improvement in saturations and work of breathing.  His last HD session was on Friday 10/7.  EKG showed ST.  He was hypertensive and exam suggestive of volume overload.  CXR showed chronic left pleural effusion.  The patient pulled his biPAP off and desaturated into the 50's.  He was treated empirically with rocephin.  COVID status was confirmed positive, flu negative. Initial labs showed K 5.2, glucose 120, BUN 43, sr cr 6.10, AG 17, troponin 29, WBC 11.4, Hgb 11.7 and platelets 165.     PCCM called for ICU admission.  Transferred under [facility] on 03/29/2021.  Further hospitalization course as below.  04/06/2021: Patient seen on hemodialysis.  Goal UF is 3 and half liters i.e. if the blood pressure permits.  Patient is on 2.5 L of supplemental oxygen.  No new complaints from patient.  Assessment & Plan:   Active Problems:   Acute respiratory failure with hypoxia (HCC)   History of thoracentesis   S/P thoracentesis   Acute on chronic hypoxic Respiratory Failure/COVID-19 pneumonitis/pulmonary edema/fluid overload: Multifactorial,  still on 20 L of high flow oxygen but despite of that, he denies any shortness of breath and looks very comfortable without any dyspnea or tachypnea..  Continue  dialysis to remove fluid.  Completed 5/5 days of remdesivir on 03/31/2019.  Continue prednisone.  Inflammatory markers improving.  Slight improvement in oxygenation compared to yesterday.  Now on 30% FiO2 compared to 40% that he was yesterday but still requiring 20 L.  Remains asymptomatic and comfortable.  Patient was encouraged to prone, out of bed to chair, to use incentive spirometry and flutter valve.  Significantly elevated D-dimer, CT angiogram negative for PE.  Completed 5 days of Rocephin and Zithromax on 04/04/2021. 04/06/2021: Oxygen requirement has decreased significantly.  Patient is only on 2.5 L/min of supplemental oxygen via nasal cannula.  Essential hypertension: Continue amlodipine and hydralazine, blood pressure controlled.  Beta-blockers on hold.  ESRD on HD: Per nephrology. 04/06/2021: Seen on hemodialysis today.  Goal UF of 3.5 L  HLD: Continue atorvastatin.   DM type II: Blood sugar now fairly controlled, continue Semglee 30 units and SSI.   Anemia of chronic disease: Stable.  DVT prophylaxis: heparin injection 5,000 Units Start: 03/26/21 2200 SCDs Start: 03/26/21 2202   Code Status: Full Code  Family Communication:  None present at bedside.  Plan of care discussed with patient in length and he verbalized understanding and agreed with it.  Status is: Inpatient  Remains inpatient appropriate because:Inpatient level of care appropriate due to severity of illness  Dispo: The patient is from: SNF              Anticipated d/c is to: SNF  Patient currently is not medically stable to d/c.   Difficult to place patient No   Estimated body mass index is 21.91 kg/m as calculated from the following:   Height as of 03/16/21: 5\' 3"  (1.6 m).   Weight as of this encounter: 56.1 kg.  Nutritional Assessment: Body mass index is 21.91 kg/m.Marland Kitchen Seen by dietician.  I agree with the assessment and plan as outlined below: Nutrition Status:   Consultants:   Nephrology  Procedures:  None  Antimicrobials:  Anti-infectives (From admission, onward)    Start     Dose/Rate Route Frequency Ordered Stop   03/30/21 1400  cefTRIAXone (ROCEPHIN) 2 g in sodium chloride 0.9 % 100 mL IVPB        2 g 200 mL/hr over 30 Minutes Intravenous Every 24 hours 03/30/21 1233 04/03/21 1742   03/30/21 1400  azithromycin (ZITHROMAX) 500 mg in sodium chloride 0.9 % 250 mL IVPB        500 mg 250 mL/hr over 60 Minutes Intravenous Every 24 hours 03/30/21 1233 04/03/21 1853   03/27/21 1000  remdesivir 100 mg in sodium chloride 0.9 % 100 mL IVPB       See Hyperspace for full Linked Orders Report.   100 mg 200 mL/hr over 30 Minutes Intravenous Daily 03/26/21 0921 03/31/21 0739   03/26/21 0930  remdesivir 200 mg in sodium chloride 0.9% 250 mL IVPB       See Hyperspace for full Linked Orders Report.   200 mg 580 mL/hr over 30 Minutes Intravenous Once 03/26/21 0921 03/26/21 1528   03/26/21 0715  cefTRIAXone (ROCEPHIN) 1 g in sodium chloride 0.9 % 100 mL IVPB        1 g 200 mL/hr over 30 Minutes Intravenous  Once 03/26/21 0703 03/26/21 0933          Subjective: Seen and examined.  He has no complaints.  Still on 20 L but 30% FiO2.  Objective: Vitals:   04/06/21 1700 04/06/21 1715 04/06/21 1730 04/06/21 1745  BP:  129/86 115/85 (!) 141/89  Pulse: 94 88 98 86  Resp: (!) 23 13 15 18   Temp:      TempSrc:      SpO2: 99% 99% 100% 98%  Weight:        Intake/Output Summary (Last 24 hours) at 04/06/2021 1757 Last data filed at 04/06/2021 0845 Gross per 24 hour  Intake 660 ml  Output 350 ml  Net 310 ml    Filed Weights   04/05/21 0437 04/06/21 0358 04/06/21 1525  Weight: 50.5 kg 53.8 kg 56.1 kg    Examination:  General exam: Appears calm and comfortable, legally blind Respiratory system: Diminished breath sounds bilaterally, respiratory effort normal. Cardiovascular system: S1 & S2  Gastrointestinal system: Abdomen is soft and nontender. No  organomegaly or masses felt. Normal bowel sounds heard. Central nervous system: Alert and oriented.  Patient moves all extremities.   Extremities: No significant leg edema.  Data Reviewed: I have personally reviewed following labs and imaging studies  CBC: Recent Labs  Lab 03/31/21 0236 03/31/21 1112 04/03/21 0208 04/05/21 0317  WBC 13.3* 17.7* 16.2* 17.9*  NEUTROABS 12.0*  --   --   --   HGB 11.0* 10.5* 9.5* 9.3*  HCT 33.0* 31.2* 29.0* 28.4*  MCV 84.2 83.0 84.1 86.1  PLT 81* 158 171 937    Basic Metabolic Panel: Recent Labs  Lab 03/31/21 0236 03/31/21 1656 04/03/21 0208 04/05/21 0132  NA 127* 126* 128* 130*  K  4.2 4.6 4.4 3.8  CL 91* 89* 93* 94*  CO2 22 23 23 23   GLUCOSE 283* 166* 300* 263*  BUN 85* 91* 77* 57*  CREATININE 5.73* 5.91* 5.91* 4.59*  CALCIUM 8.5* 8.6* 8.4* 8.7*  MG 2.6*  --   --   --   PHOS 2.3* 2.2* 3.5 3.2    GFR: Estimated Creatinine Clearance: 15.4 mL/min (A) (by C-G formula based on SCr of 4.59 mg/dL (H)). Liver Function Tests: Recent Labs  Lab 03/31/21 0236 03/31/21 1656 04/03/21 0208 04/05/21 0132  AST 28  --   --   --   ALT 25  --   --   --   ALKPHOS 87  --   --   --   BILITOT 0.6  --   --   --   PROT 7.0  --   --   --   ALBUMIN 3.0* 2.9* 2.8* 2.9*    No results for input(s): LIPASE, AMYLASE in the last 168 hours. No results for input(s): AMMONIA in the last 168 hours. Coagulation Profile: No results for input(s): INR, PROTIME in the last 168 hours. Cardiac Enzymes: No results for input(s): CKTOTAL, CKMB, CKMBINDEX, TROPONINI in the last 168 hours. BNP (last 3 results) No results for input(s): PROBNP in the last 8760 hours. HbA1C: No results for input(s): HGBA1C in the last 72 hours. CBG: Recent Labs  Lab 04/06/21 0352 04/06/21 0826 04/06/21 0948 04/06/21 1249 04/06/21 1713  GLUCAP 204* 44* 164* 206* 167*    Lipid Profile: No results for input(s): CHOL, HDL, LDLCALC, TRIG, CHOLHDL, LDLDIRECT in the last 72  hours. Thyroid Function Tests: No results for input(s): TSH, T4TOTAL, FREET4, T3FREE, THYROIDAB in the last 72 hours. Anemia Panel: No results for input(s): VITAMINB12, FOLATE, FERRITIN, TIBC, IRON, RETICCTPCT in the last 72 hours.  Sepsis Labs: No results for input(s): PROCALCITON, LATICACIDVEN in the last 168 hours.   No results found for this or any previous visit (from the past 240 hour(s)).      Radiology Studies: No results found.  Scheduled Meds:  acetaminophen  1,000 mg Oral Once   amLODipine  10 mg Oral Daily   aspirin  81 mg Oral Daily   atorvastatin  40 mg Oral Daily   brimonidine  1 drop Left Eye BID   darbepoetin (ARANESP) injection - DIALYSIS  60 mcg Intravenous Q Fri-HD   docusate sodium  100 mg Oral BID   dorzolamide-timolol  1 drop Left Eye BID   heparin injection (subcutaneous)  5,000 Units Subcutaneous Q8H   hydrALAZINE  25 mg Oral Q8H   insulin aspart  0-6 Units Subcutaneous Q4H   insulin glargine-yfgn  30 Units Subcutaneous Daily   latanoprost  1 drop Left Eye QHS   mupirocin ointment   Nasal BID   olopatadine  2 drop Both Eyes BID   polyethylene glycol  17 g Oral Daily   polyvinyl alcohol  1 drop Both Eyes TID   predniSONE  50 mg Oral Daily   pregabalin  25 mg Oral BID   senna  1 tablet Oral QHS   sertraline  25 mg Oral Daily   Continuous Infusions:  sodium chloride     sodium chloride        LOS: 11 days   Time spent: 35 minutes   Bonnell Public, MD [facility]  04/06/2021, 5:57 PM  Please page via [place] [place] and do not message via secure chat for anything urgent. Secure chat can be  used for anything non urgent.  How to contact the [facility] Attending or Consulting provider [place] or covering provider during after hours [place], for this patient?  Check the care team in [facility] and look for a) attending/consulting TRH provider listed and b) the [facility] team listed. Page or secure chat [phone]. Log into [email or web address] and use [place]'s  universal password to access. If you do not have the password, please contact the hospital operator. Locate the [facility] provider you are looking for under [facility] and page to a number that you can be directly reached. If you still have difficulty reaching the provider, please page the [facility] (Director on Call) for the Hospitalists listed on amion for assistance.

## 2021-04-06 NOTE — Progress Notes (Signed)
Patient states he is cramping. UF turned off and 100 normal saline given. Patient request UF off for remainder of treatment.

## 2021-04-06 NOTE — Progress Notes (Signed)
[facility] Progress Note   Subjective:   Seen in room, O2 was able to be decreased to 3L [place]. Pt reports his fistula bled a little bit overnight. IV team replaced dressing and does not appear to be bleeding at present. Denies SOB, CP, palpitations, dizziness,and nausea.   Objective Vitals:   04/06/21 0342 04/06/21 0358 04/06/21 0534 04/06/21 0819  BP: (!) 159/84  (!) 147/85 (!) 158/83  Pulse: 84   83  Resp: 19   20  Temp: 98.2 F (36.8 C)   98.2 F (36.8 C)  TempSrc: Oral   Oral  SpO2: 95%   100%  Weight:  53.8 kg     Physical Exam General: WDWN male, alert and in NAD Heart: RRR, no murmur Lungs: CTA bilaterally with diminished breath sounds bilaterally on [place] Abdomen: Soft, non-tender, non-distended, +BS Extremities: No edema b/l lower extremities Dialysis Access: RUE AVT + bruit, dressing with dry blood, does not appear to be actively bleeding  Additional Objective Labs: Basic Metabolic Panel: Recent Labs  Lab 03/31/21 1656 04/03/21 0208 04/05/21 0132  NA 126* 128* 130*  K 4.6 4.4 3.8  CL 89* 93* 94*  CO2 23 23 23   GLUCOSE 166* 300* 263*  BUN 91* 77* 57*  CREATININE 5.91* 5.91* 4.59*  CALCIUM 8.6* 8.4* 8.7*  PHOS 2.2* 3.5 3.2   Liver Function Tests: Recent Labs  Lab 03/31/21 0236 03/31/21 1656 04/03/21 0208 04/05/21 0132  AST 28  --   --   --   ALT 25  --   --   --   ALKPHOS 87  --   --   --   BILITOT 0.6  --   --   --   PROT 7.0  --   --   --   ALBUMIN 3.0* 2.9* 2.8* 2.9*   No results for input(s): LIPASE, AMYLASE in the last 168 hours. CBC: Recent Labs  Lab 03/31/21 0236 03/31/21 1112 04/03/21 0208 04/05/21 0317  WBC 13.3* 17.7* 16.2* 17.9*  NEUTROABS 12.0*  --   --   --   HGB 11.0* 10.5* 9.5* 9.3*  HCT 33.0* 31.2* 29.0* 28.4*  MCV 84.2 83.0 84.1 86.1  PLT 81* 158 171 220   Blood Culture    Component Value Date/Time   SDES BLOOD RIGHT HAND 10/27/2019 1208   SDES BLOOD RIGHT HAND 10/27/2019 1208   SPECREQUEST  10/27/2019  1208    BOTTLES DRAWN AEROBIC AND ANAEROBIC Blood Culture adequate volume   SPECREQUEST  10/27/2019 1208    BOTTLES DRAWN AEROBIC AND ANAEROBIC Blood Culture adequate volume   CULT  10/27/2019 1208    NO GROWTH 5 DAYS Performed at [facility], [place] [address], [place], [place] [address]    CULT  10/27/2019 1208    NO GROWTH 5 DAYS Performed at [facility], [place] [address], [place], [place] [address]    REPTSTATUS 11/01/2019 FINAL 10/27/2019 1208   REPTSTATUS 11/01/2019 FINAL 10/27/2019 1208    Cardiac Enzymes: No results for input(s): CKTOTAL, CKMB, CKMBINDEX, TROPONINI in the last 168 hours. CBG: Recent Labs  Lab 04/05/21 2342 04/06/21 0153 04/06/21 0352 04/06/21 0826 04/06/21 0948  GLUCAP 419* 280* 204* 44* 164*   Iron Studies: No results for input(s): IRON, TIBC, TRANSFERRIN, FERRITIN in the last 72 hours. @lablastinr3 @ Studies/Results: No results found. Medications:  sodium chloride     sodium chloride      acetaminophen  1,000 mg Oral Once   amLODipine  10 mg Oral Daily   aspirin  81 mg Oral Daily   atorvastatin  40 mg Oral Daily   brimonidine  1 drop Left Eye BID   darbepoetin (ARANESP) injection - DIALYSIS  60 mcg Intravenous Q Fri-HD   docusate sodium  100 mg Oral BID   dorzolamide-timolol  1 drop Left Eye BID   heparin injection (subcutaneous)  5,000 Units Subcutaneous Q8H   hydrALAZINE  25 mg Oral Q8H   insulin aspart  0-6 Units Subcutaneous Q4H   insulin glargine-yfgn  30 Units Subcutaneous Daily   latanoprost  1 drop Left Eye QHS   mupirocin ointment   Nasal BID   olopatadine  2 drop Both Eyes BID   polyethylene glycol  17 g Oral Daily   polyvinyl alcohol  1 drop Both Eyes TID   predniSONE  50 mg Oral Daily   pregabalin  25 mg Oral BID   senna  1 tablet Oral QHS   sertraline  25 mg Oral Daily    Dialysis Orders: Outpatient orders: [facility], MWF, 4 hrs, F180, 2k, 2cal, edw 53kg, no heparin  Assessment/Plan: 1) ESRD:  Intermittent  HD refusal, now back on track. Was feeling poorly for last hour of HD but is tolerating it better this week. HD again today, continue MWF schedule.   2)  AHRF COVID positive 03/24/2021 Pulmonary edema, left pleural effusion - completed 4d course of remdesivir per primary service, on pred -thora 10/10: 1200cc drained 3)  Hyponatremia -likely hypervolemic, improving with HD 4)  Volume/ hypertension: EDW 53kg.- now under his EDW. CXR is improved and O2 requirement is decreasing. UF as tolerated 5)  Anemia of Chronic Kidney Disease: Hemoglobin 9.3, Receiving Mircera 4mcg qtreatment, last dose 10/3 as an outpatient. Will order ESA with HD today. 6) Secondary Hyperparathyroidism/Hyperphosphatemia: Calcium controlled. Phos running low, discontinuing his binders for now.  8) Additional recommendations: - Dose all meds for creatinine clearance < 10 ml/min  - Unless absolutely necessary, no MRIs with gadolinium.  -  No blood pressure measurements in rt arm. - If blood transfusion is requested during hemodialysis sessions, please alert [place] prior to the session.   Anice Paganini, PA-C 04/06/2021, 12:12 PM  [employer] Pager: [phone]

## 2021-04-06 NOTE — TOC Progression Note (Signed)
Transition of Care [facility]) - Progression Note    Patient Details  Name: Jonta Gastineau MRN: [id number] Date of Birth: [birth year]  Transition of Care [facility]) CM/SW [place], LCSW Phone Number: 04/06/2021, 1:02 PM  Clinical Narrative:    CSW provided update to Avis that patient is not medically ready yet.    Expected Discharge Plan: [place] Barriers to Discharge: Continued Medical Work up  Expected Discharge Plan and Services Expected Discharge Plan: [place] In-house Referral: Clinical Social Work   Post Acute Care Choice: [place] Living arrangements for the past 2 months: [place]                                       Social Determinants of Health (SDOH) Interventions    Readmission Risk Interventions Readmission Risk Prevention Plan 04/06/2021 02/19/2019  Transportation Screening Complete Complete  PCP or Specialist Appt within 5-7 Days - Complete  Home Care Screening - Complete  Medication Review (RN CM) - Complete  Medication Review Press photographer) Complete -  PCP or Specialist appointment within 3-5 days of discharge Complete -  [place] or Home Care Consult Complete -  SW Recovery Care/Counseling Consult Complete -  Palliative Care Screening Not Applicable -  [place] Complete -  Some recent data might be hidden

## 2021-04-06 NOTE — TOC Initial Note (Signed)
Transition of Care [facility]) - Initial/Assessment Note    Patient Details  Name: Christopher Davila MRN: [id number] Date of Birth: October 14, 1970  Transition of Care [facility]) CM/SW Contact:    Benard Halsted, LCSW Phone Number: 04/06/2021, 1:02 PM  Clinical Narrative:                 Patient resides under long term care at [facility]. CSW will continue to follow.   Expected Discharge Plan: Skilled Nursing Facility Barriers to Discharge: Continued Medical Work up   Patient Goals and CMS Choice Patient states their goals for this hospitalization and ongoing recovery are:: Return to snf CMS Medicare.gov Compare Post Acute Care list provided to:: Patient Choice offered to / list presented to : Patient  Expected Discharge Plan and Services Expected Discharge Plan: [place] In-house Referral: Clinical Social Work   Post Acute Care Choice: [place] Living arrangements for the past 2 months: [place]                                      Prior Living Arrangements/Services Living arrangements for the past 2 months: [place] Lives with:: Facility Resident Patient language and need for interpreter reviewed:: Yes Do you feel safe going back to the place where you live?: Yes      Need for Family Participation in Patient Care: No (Comment) Care giver support system in place?: Yes (comment)   Criminal Activity/Legal Involvement Pertinent to Current Situation/Hospitalization: No - Comment as needed  Activities of Daily Living      Permission Sought/Granted Permission sought to share information with : Facility Art therapist granted to share information with : Yes, Verbal Permission Granted     Permission granted to share info w AGENCY: [name]        Emotional Assessment Appearance:: Appears stated age Attitude/Demeanor/Rapport: Engaged Affect (typically observed): Accepting,  Appropriate Orientation: : Oriented to Self, Oriented to Place, Oriented to  Time, Oriented to Situation Alcohol / Substance Use: Not Applicable Psych Involvement: No (comment)  Admission diagnosis:  Shortness of breath [R06.02] S/P thoracentesis [Z98.890] Acute respiratory failure with hypoxia (HCC) [J96.01] History of thoracentesis [Z98.890] Patient Active Problem List   Diagnosis Date Noted   History of thoracentesis    S/P thoracentesis    Acute respiratory failure with hypoxia ([place]) 03/26/2021   Pressure injury of skin 11/06/2019   ESRD on dialysis [facility])    Heart failure with preserved ejection fraction (HCC)    Goals of care, counseling/discussion    Palliative care encounter    Diarrhea [id number]   Metabolic acidosis, increased anion gap 10/13/2019   Acute urinary retention 10/13/2019   Weakness 10/05/2019   Acute on chronic diastolic CHF (congestive heart failure) ([place]) 09/27/2019   Vision loss of left eye 09/21/2019   Acute kidney injury superimposed on chronic kidney disease ([place]) 09/19/2019   Hyponatremia 09/19/2019   Homeless 09/19/2019   Anemia of chronic disease 09/19/2019   Vision loss, left eye 09/19/2019   Acute loss of vision, right 06/16/2019   Hypertensive urgency 04/17/2019   Hypokalemia 04/17/2019   Normocytic anemia 04/17/2019   Hyperglycemia    CKD (chronic kidney disease) stage 4, GFR 15-29 ml/min (HCC)    Alcohol use    Gastroparesis due to DM (HCC)    Essential hypertension    Hyperlipidemia    Type 2 diabetes mellitus with hypoglycemia (  [place]) 06/26/2012   PCP:  [name], MD Pharmacy:   [place], [place] [address] [place] [address] Phone: [phone] Fax: [phone]     Social Determinants of Health (SDOH) Interventions    Readmission Risk Interventions Readmission Risk Prevention Plan 04/06/2021 02/19/2019  Transportation Screening Complete Complete  PCP or  Specialist Appt within 5-7 Days - Complete  Home Care Screening - Complete  Medication Review (RN CM) - Complete  Medication Review ([place]) Complete -  PCP or Specialist appointment within 3-5 days of discharge Complete -  [place] or Home Care Consult Complete -  SW Recovery Care/Counseling Consult Complete -  Palliative Care Screening Not Applicable -  [place] Complete -  Some recent data might be hidden

## 2021-04-06 NOTE — TOC Progression Note (Signed)
Transition of Care [facility]) - Progression Note    Patient Details  Name: [place] MRN: [id number] Date of Birth: 08-Mar-1971  Transition of Care [facility]) CM/SW [place], LCSW Phone Number: 04/06/2021, 1:03 PM  Clinical Narrative:    Per [name], they can accept patient back over the weekend if needed.   Expected Discharge Plan: [place] Barriers to Discharge: Continued Medical Work up  Expected Discharge Plan and Services Expected Discharge Plan: [place] In-house Referral: Clinical Social Work   Post Acute Care Choice: [place] Living arrangements for the past 2 months: [place]                                       Social Determinants of Health (SDOH) Interventions    Readmission Risk Interventions Readmission Risk Prevention Plan 04/06/2021 02/19/2019  Transportation Screening Complete Complete  PCP or Specialist Appt within 5-7 Days - Complete  Home Care Screening - Complete  Medication Review (RN CM) - Complete  Medication Review Press photographer) Complete -  PCP or Specialist appointment within 3-5 days of discharge Complete -  [place] or Home Care Consult Complete -  SW Recovery Care/Counseling Consult Complete -  Palliative Care Screening Not Applicable -  [place] Complete -  Some recent data might be hidden

## 2021-04-07 DIAGNOSIS — E11649 Type 2 diabetes mellitus with hypoglycemia without coma: Secondary | ICD-10-CM

## 2021-04-07 DIAGNOSIS — Z794 Long term (current) use of insulin: Secondary | ICD-10-CM | POA: Diagnosis not present

## 2021-04-07 DIAGNOSIS — J9601 Acute respiratory failure with hypoxia: Secondary | ICD-10-CM | POA: Diagnosis not present

## 2021-04-07 LAB — GLUCOSE, CAPILLARY
Glucose-Capillary: 148 mg/dL — ABNORMAL HIGH (ref 70–99)
Glucose-Capillary: 156 mg/dL — ABNORMAL HIGH (ref 70–99)
Glucose-Capillary: 192 mg/dL — ABNORMAL HIGH (ref 70–99)
Glucose-Capillary: 217 mg/dL — ABNORMAL HIGH (ref 70–99)
Glucose-Capillary: 221 mg/dL — ABNORMAL HIGH (ref 70–99)
Glucose-Capillary: 226 mg/dL — ABNORMAL HIGH (ref 70–99)
Glucose-Capillary: 24 mg/dL — CL (ref 70–99)
Glucose-Capillary: 282 mg/dL — ABNORMAL HIGH (ref 70–99)
Glucose-Capillary: 326 mg/dL — ABNORMAL HIGH (ref 70–99)
Glucose-Capillary: 337 mg/dL — ABNORMAL HIGH (ref 70–99)
Glucose-Capillary: 34 mg/dL — CL (ref 70–99)

## 2021-04-07 MED ORDER — DEXTROSE 50 % IV SOLN
25.0000 g | Freq: Once | INTRAVENOUS | Status: AC
  Administered 2021-04-07: 25 g via INTRAVENOUS

## 2021-04-07 MED ORDER — DEXTROSE 50 % IV SOLN
INTRAVENOUS | Status: AC
  Filled 2021-04-07: qty 50

## 2021-04-07 NOTE — Progress Notes (Signed)
[facility] Progress Note   Subjective:    Seen and examined patient at bedside. Tolerated yesterday's HD with net UF 2.6L. He reports his AVF was bleeding 2 nights ago. Noted dry dressing over fistula which appears clean, dry, and intact. He denies SOB, CP, and N/V. Plan for Hd 10/24.  Objective Vitals:   04/07/21 0745 04/07/21 1143 04/07/21 1156 04/07/21 1449  BP: (!) 146/74  (!) 143/79 140/83  Pulse: 71 84 88 82  Resp: 15 16 20 18   Temp: 97.8 F (36.6 C) 98 F (36.7 C)  98.2 F (36.8 C)  TempSrc: Axillary Axillary  Oral  SpO2: 100% 99% 96% 100%  Weight:       Physical Exam General: WDWN male, alert and NAD; On 3L [place] Heart: RRR, no murmur Lungs: CTA bilaterally with diminished breath sounds bilaterally on [place] Abdomen: Soft, non-tender, non-distended, +BS Extremities: No edema b/l lower extremities Dialysis Access: RUE AVT + bruit, dressing with dry blood, does not appear to be actively bleeding   Filed Weights   04/06/21 1525 04/06/21 1952 04/07/21 0343  Weight: 56.1 kg 53.4 kg 53 kg    Intake/Output Summary (Last 24 hours) at 04/07/2021 1649 Last data filed at 04/07/2021 1334 Gross per 24 hour  Intake 1020 ml  Output 2719 ml  Net -1699 ml    Additional Objective Labs: Basic Metabolic Panel: Recent Labs  Lab 04/03/21 0208 04/05/21 0132 04/06/21 2133  NA 128* 130* 133*  K 4.4 3.8 3.8  CL 93* 94* 97*  CO2 23 23 27   GLUCOSE 300* 263* 144*  BUN 77* 57* 19  CREATININE 5.91* 4.59* 2.06*  CALCIUM 8.4* 8.7* 8.6*  PHOS 3.5 3.2 1.5*   Liver Function Tests: Recent Labs  Lab 04/03/21 0208 04/05/21 0132 04/06/21 2133  ALBUMIN 2.8* 2.9* 3.2*   No results for input(s): LIPASE, AMYLASE in the last 168 hours. CBC: Recent Labs  Lab 04/03/21 0208 04/05/21 0317 04/06/21 2133  WBC 16.2* 17.9* 22.0*  HGB 9.5* 9.3* 9.7*  HCT 29.0* 28.4* 29.1*  MCV 84.1 86.1 83.6  PLT 171 220 245   Blood Culture    Component Value Date/Time   SDES BLOOD  RIGHT HAND 10/27/2019 1208   SDES BLOOD RIGHT HAND 10/27/2019 1208   SPECREQUEST  10/27/2019 1208    BOTTLES DRAWN AEROBIC AND ANAEROBIC Blood Culture adequate volume   SPECREQUEST  10/27/2019 1208    BOTTLES DRAWN AEROBIC AND ANAEROBIC Blood Culture adequate volume   CULT  10/27/2019 1208    NO GROWTH 5 DAYS Performed at [facility], [place] [address], [place], [place] [address]    CULT  10/27/2019 1208    NO GROWTH 5 DAYS Performed at [facility], [place] [address] [place], [place] [address]    REPTSTATUS 11/01/2019 FINAL 10/27/2019 1208   REPTSTATUS 11/01/2019 FINAL 10/27/2019 1208    Cardiac Enzymes: No results for input(s): CKTOTAL, CKMB, CKMBINDEX, TROPONINI in the last 168 hours. CBG: Recent Labs  Lab 04/07/21 0809 04/07/21 0822 04/07/21 0949 04/07/21 1201 04/07/21 1447  GLUCAP 34* 226* 192* 148* 217*   Iron Studies: No results for input(s): IRON, TIBC, TRANSFERRIN, FERRITIN in the last 72 hours. Lab Results  Component Value Date   INR 1.1 10/13/2019   INR 1.0 06/16/2019   INR 1.0 02/18/2019   Studies/Results: No results found.  Medications:   acetaminophen  1,000 mg Oral Once   amLODipine  10 mg Oral Daily   aspirin  81 mg  Oral Daily   atorvastatin  40 mg Oral Daily   brimonidine  1 drop Left Eye BID   darbepoetin (ARANESP) injection - DIALYSIS  60 mcg Intravenous Q Fri-HD   dextrose       docusate sodium  100 mg Oral BID   dorzolamide-timolol  1 drop Left Eye BID   heparin injection (subcutaneous)  5,000 Units Subcutaneous Q8H   hydrALAZINE  25 mg Oral Q8H   insulin aspart  0-6 Units Subcutaneous Q4H   insulin glargine-yfgn  30 Units Subcutaneous Daily   latanoprost  1 drop Left Eye QHS   mupirocin ointment   Nasal BID   olopatadine  2 drop Both Eyes BID   polyethylene glycol  17 g Oral Daily   polyvinyl alcohol  1 drop Both Eyes TID   predniSONE  50 mg Oral Daily   pregabalin  25 mg Oral BID   senna  1 tablet Oral QHS   sertraline  25  mg Oral Daily    Dialysis Orders: Outpatient orders: [facility], MWF, 4 hrs, F180, 2k, 2cal, edw 53kg, no heparin  Assessment/Plan: 1) ESRD:  Intermittent HD refusal, now back on track. Was feeling poorly for last hour of HD but is tolerating it better this week. Tolerated yesterday's HD with net UF 2.6L. Plan for HD 10/24.   2)  AHRF COVID positive 03/24/2021 Pulmonary edema, left pleural effusion - completed 4d course of remdesivir per primary service, on pred -thora 10/10: 1200cc drained 3)  Hyponatremia -likely hypervolemic, improving with HD 4)  Volume/ hypertension: EDW 53kg.- now under his EDW. CXR is improved and O2 requirement is decreasing. UF as tolerated 5)  Anemia of Chronic Kidney Disease: Hemoglobin 9.7, Receiving Mircera 74mcg qtreatment, last dose 10/3 as an outpatient. Continue Aranesp here q Friday with HD. 6) Secondary Hyperparathyroidism/Hyperphosphatemia: Calcium controlled. Phos running low, discontinuing his binders for now.  8) Additional recommendations: - Dose all meds for creatinine clearance < 10 ml/min  - Unless absolutely necessary, no MRIs with gadolinium.  -  No blood pressure measurements in rt arm. - If blood transfusion is requested during hemodialysis sessions, please alert [place] prior to the session  Tobie Poet, NP [place] 04/07/2021,4:49 PM  LOS: 12 days

## 2021-04-07 NOTE — Progress Notes (Signed)
PROGRESS NOTE    Christopher Davila  GYB:[id number] DOB: Jan 25, 1971 DOA: 03/26/2021 PCP: [name], MD   Brief Narrative:  50 y/o M who presented to [facility] on 10/10 with reports of shortness of breath.    The patient lives in a facility and is baseline 3L O2 dependent.  He is HD dependent M/W/F.  The facility [facility]) called EMS for shortness of breath. He normally wears 3L oxygen, oxygen sats were in the 60's and was increased to 5L at the facility without improvement. EMS was called and confirmed hypoxia.  The patient was COVID positive 2 days prior to admit (10/8) at the facility.  On arrival to the ER, he failed [place]ter O2 and was placed on CPAP without improvement.  He ultimately was placed on BiPAP with improvement in saturations and work of breathing.  His last HD session was on Friday 10/7.  EKG showed ST.  He was hypertensive and exam suggestive of volume overload.  CXR showed chronic left pleural effusion.  The patient pulled his biPAP off and desaturated into the 50's.  He was treated empirically with rocephin.  COVID status was confirmed positive, flu negative. Initial labs showed K 5.2, glucose 120, BUN 43, sr cr 6.10, AG 17, troponin 29, WBC 11.4, Hgb 11.7 and platelets 165.     PCCM called for ICU admission.  Transferred under [place] on 03/29/2021.  Further hospitalization course as below.  04/06/2021: Patient seen on hemodialysis.  Goal UF is 3 and half liters i.e. if the blood pressure permits.  Patient is on 2.5 L of supplemental oxygen.  No new complaints from patient.  04/07/2021: Patient seen.  No significant history from patient.  Blood sugar for reported earlier today.  We discontinued the long-acting insulin.  Continue to monitor blood sugar closely.  D50 x1 given.  Blood sugar will be monitored every hour for the next 4 hours.  D10 at 40 cc/h.  Recommended if blood sugar continues to drop.  Assessment & Plan:   Active Problems:   Acute respiratory failure with hypoxia  (HCC)   History of thoracentesis   S/P thoracentesis   Acute on chronic hypoxic Respiratory Failure/COVID-19 pneumonitis/pulmonary edema/fluid overload: Multifactorial,  still on 20 L of high flow oxygen but despite of that, he denies any shortness of breath and looks very comfortable without any dyspnea or tachypnea..  Continue dialysis to remove fluid.  Completed 5/5 days of remdesivir on 03/31/2019.  Continue prednisone.  Inflammatory markers improving.  Slight improvement in oxygenation compared to yesterday.  Now on 30% FiO2 compared to 40% that he was yesterday but still requiring 20 L.  Remains asymptomatic and comfortable.  Patient was encouraged to prone, out of bed to chair, to use incentive spirometry and flutter valve.  Significantly elevated D-dimer, CT angiogram negative for PE.  Completed 5 days of Rocephin and Zithromax on 04/04/2021. 04/06/2021: Oxygen requirement has decreased significantly.  Patient is only on 2.5 L/min of supplemental oxygen via nasal cannula. 04/07/2021: No changes.  Continue to monitor closely.  Essential hypertension: Continue amlodipine and hydralazine, blood pressure controlled.  Beta-blockers on hold. 04/07/2021: Blood pressure has ranged from 101-179/63-81 mmHg over the last 24 hours.  We will continue to optimize.  ESRD on HD: Per nephrology. 04/06/2021: Seen on hemodialysis today.  Goal UF of 3.5 L 04/07/2021: Nephrology team is following.  Will defer to the nephrology team.  Hypoglycemia: -See above documentation.  HLD: Continue atorvastatin.   DM type II: Blood sugar now  fairly controlled, continue Semglee 30 units and SSI.   Anemia of chronic disease: Stable.  DVT prophylaxis: heparin injection 5,000 Units Start: 03/26/21 2200 SCDs Start: 03/26/21 9628   Code Status: Full Code  Family Communication:  None present at bedside.  Plan of care discussed with patient in length and he verbalized understanding and agreed with it.  Status is:  Inpatient  Remains inpatient appropriate because:Inpatient level of care appropriate due to severity of illness  Dispo: The patient is from: SNF              Anticipated d/c is to: SNF              Patient currently is not medically stable to d/c.   Difficult to place patient No   Estimated body mass index is 20.7 kg/m as calculated from the following:   Height as of 03/16/21: 5\' 3"  (1.6 m).   Weight as of this encounter: 53 kg.  Nutritional Assessment: Body mass index is 20.7 kg/m.Marland Kitchen Seen by dietician.  I agree with the assessment and plan as outlined below: Nutrition Status:   Consultants:  Nephrology  Procedures:  None  Antimicrobials:  Anti-infectives (From admission, onward)    Start     Dose/Rate Route Frequency Ordered Stop   03/30/21 1400  cefTRIAXone (ROCEPHIN) 2 g in sodium chloride 0.9 % 100 mL IVPB        2 g 200 mL/hr over 30 Minutes Intravenous Every 24 hours 03/30/21 1233 04/03/21 1742   03/30/21 1400  azithromycin (ZITHROMAX) 500 mg in sodium chloride 0.9 % 250 mL IVPB        500 mg 250 mL/hr over 60 Minutes Intravenous Every 24 hours 03/30/21 1233 04/03/21 1853   03/27/21 1000  remdesivir 100 mg in sodium chloride 0.9 % 100 mL IVPB       See Hyperspace for full Linked Orders Report.   100 mg 200 mL/hr over 30 Minutes Intravenous Daily 03/26/21 0921 03/31/21 0739   03/26/21 0930  remdesivir 200 mg in sodium chloride 0.9% 250 mL IVPB       See Hyperspace for full Linked Orders Report.   200 mg 580 mL/hr over 30 Minutes Intravenous Once 03/26/21 0921 03/26/21 1528   03/26/21 0715  cefTRIAXone (ROCEPHIN) 1 g in sodium chloride 0.9 % 100 mL IVPB        1 g 200 mL/hr over 30 Minutes Intravenous  Once 03/26/21 0703 03/26/21 0933          Subjective: Seen and examined.  He has no complaints.  Still on 20 L but 30% FiO2.  Objective: Vitals:   04/07/21 0745 04/07/21 1143 04/07/21 1156 04/07/21 1449  BP: (!) 146/74  (!) 143/79 140/83  Pulse: 71 84 88  82  Resp: 15 16 20 18   Temp: 97.8 F (36.6 C) 98 F (36.7 C)  98.2 F (36.8 C)  TempSrc: Axillary Axillary  Oral  SpO2: 100% 99% 96% 100%  Weight:        Intake/Output Summary (Last 24 hours) at 04/07/2021 1805 Last data filed at 04/07/2021 1334 Gross per 24 hour  Intake 1020 ml  Output 2719 ml  Net -1699 ml    Filed Weights   04/06/21 1525 04/06/21 1952 04/07/21 0343  Weight: 56.1 kg 53.4 kg 53 kg    Examination:  General exam: Appears calm and comfortable, legally blind Respiratory system: Diminished breath sounds bilaterally, respiratory effort normal. Cardiovascular system: S1 & S2  Gastrointestinal system: Abdomen  is soft and nontender. No organomegaly or masses felt. Normal bowel sounds heard. Central nervous system: Alert and oriented.  Patient moves all extremities.   Extremities: No significant leg edema.  Data Reviewed: I have personally reviewed following labs and imaging studies  CBC: Recent Labs  Lab 04/03/21 0208 04/05/21 0317 04/06/21 2133  WBC 16.2* 17.9* 22.0*  HGB 9.5* 9.3* 9.7*  HCT 29.0* 28.4* 29.1*  MCV 84.1 86.1 83.6  PLT 171 220 353    Basic Metabolic Panel: Recent Labs  Lab 04/03/21 0208 04/05/21 0132 04/06/21 2133  NA 128* 130* 133*  K 4.4 3.8 3.8  CL 93* 94* 97*  CO2 23 23 27   GLUCOSE 300* 263* 144*  BUN 77* 57* 19  CREATININE 5.91* 4.59* 2.06*  CALCIUM 8.4* 8.7* 8.6*  PHOS 3.5 3.2 1.5*    GFR: Estimated Creatinine Clearance: 32.5 mL/min (A) (by C-G formula based on SCr of 2.06 mg/dL (H)). Liver Function Tests: Recent Labs  Lab 04/03/21 0208 04/05/21 0132 04/06/21 2133  ALBUMIN 2.8* 2.9* 3.2*    No results for input(s): LIPASE, AMYLASE in the last 168 hours. No results for input(s): AMMONIA in the last 168 hours. Coagulation Profile: No results for input(s): INR, PROTIME in the last 168 hours. Cardiac Enzymes: No results for input(s): CKTOTAL, CKMB, CKMBINDEX, TROPONINI in the last 168 hours. BNP (last 3  results) No results for input(s): PROBNP in the last 8760 hours. HbA1C: No results for input(s): HGBA1C in the last 72 hours. CBG: Recent Labs  Lab 04/07/21 0822 04/07/21 0949 04/07/21 1201 04/07/21 1447 04/07/21 1737  GLUCAP 226* 192* 148* 217* 221*    Lipid Profile: No results for input(s): CHOL, HDL, LDLCALC, TRIG, CHOLHDL, LDLDIRECT in the last 72 hours. Thyroid Function Tests: No results for input(s): TSH, T4TOTAL, FREET4, T3FREE, THYROIDAB in the last 72 hours. Anemia Panel: No results for input(s): VITAMINB12, FOLATE, FERRITIN, TIBC, IRON, RETICCTPCT in the last 72 hours.  Sepsis Labs: No results for input(s): PROCALCITON, LATICACIDVEN in the last 168 hours.   No results found for this or any previous visit (from the past 240 hour(s)).      Radiology Studies: No results found.  Scheduled Meds:  acetaminophen  1,000 mg Oral Once   amLODipine  10 mg Oral Daily   aspirin  81 mg Oral Daily   atorvastatin  40 mg Oral Daily   brimonidine  1 drop Left Eye BID   darbepoetin (ARANESP) injection - DIALYSIS  60 mcg Intravenous Q Fri-HD   dextrose       docusate sodium  100 mg Oral BID   dorzolamide-timolol  1 drop Left Eye BID   heparin injection (subcutaneous)  5,000 Units Subcutaneous Q8H   hydrALAZINE  25 mg Oral Q8H   insulin aspart  0-6 Units Subcutaneous Q4H   insulin glargine-yfgn  30 Units Subcutaneous Daily   latanoprost  1 drop Left Eye QHS   mupirocin ointment   Nasal BID   olopatadine  2 drop Both Eyes BID   polyethylene glycol  17 g Oral Daily   polyvinyl alcohol  1 drop Both Eyes TID   predniSONE  50 mg Oral Daily   pregabalin  25 mg Oral BID   senna  1 tablet Oral QHS   sertraline  25 mg Oral Daily   Continuous Infusions:      LOS: 12 days   Time spent: 25 minutes   Bonnell Public, MD [facility]  04/07/2021, 6:05 PM  Please page  via Amion and do not message via secure chat for anything urgent. Secure chat can be used for  anything non urgent.  How to contact the [facility] Attending or Consulting provider [place] or covering provider during after hours [place], for this patient?  Check the care team in [facility] and look for a) attending/consulting [facility] provider listed and b) the [facility] team listed. Page or secure chat [phone]. Log into [email or web address] and use [place]'s universal password to access. If you do not have the password, please contact the hospital operator. Locate the [facility] provider you are looking for under [facility] and page to a number that you can be directly reached. If you still have difficulty reaching the provider, please page the [facility] (Director on Call) for the Hospitalists listed on amion for assistance.

## 2021-04-08 DIAGNOSIS — R0602 Shortness of breath: Secondary | ICD-10-CM

## 2021-04-08 DIAGNOSIS — N186 End stage renal disease: Secondary | ICD-10-CM

## 2021-04-08 DIAGNOSIS — E8770 Fluid overload, unspecified: Secondary | ICD-10-CM

## 2021-04-08 DIAGNOSIS — Z992 Dependence on renal dialysis: Secondary | ICD-10-CM

## 2021-04-08 DIAGNOSIS — U071 COVID-19: Secondary | ICD-10-CM | POA: Diagnosis not present

## 2021-04-08 DIAGNOSIS — E8779 Other fluid overload: Secondary | ICD-10-CM

## 2021-04-08 DIAGNOSIS — J9601 Acute respiratory failure with hypoxia: Secondary | ICD-10-CM | POA: Diagnosis not present

## 2021-04-08 DIAGNOSIS — R0902 Hypoxemia: Secondary | ICD-10-CM

## 2021-04-08 DIAGNOSIS — Z9889 Other specified postprocedural states: Secondary | ICD-10-CM | POA: Diagnosis not present

## 2021-04-08 LAB — GLUCOSE, CAPILLARY
Glucose-Capillary: 303 mg/dL — ABNORMAL HIGH (ref 70–99)
Glucose-Capillary: 228 mg/dL — ABNORMAL HIGH (ref 70–99)
Glucose-Capillary: 204 mg/dL — ABNORMAL HIGH (ref 70–99)
Glucose-Capillary: 228 mg/dL — ABNORMAL HIGH (ref 70–99)
Glucose-Capillary: 262 mg/dL — ABNORMAL HIGH (ref 70–99)
Glucose-Capillary: 390 mg/dL — ABNORMAL HIGH (ref 70–99)
Glucose-Capillary: 367 mg/dL — ABNORMAL HIGH (ref 70–99)

## 2021-04-08 MED ORDER — SODIUM CHLORIDE 0.9 % IV SOLN: 100.0000 mL | INTRAVENOUS | Status: DC | PRN

## 2021-04-08 MED ORDER — CHLORHEXIDINE GLUCONATE CLOTH 2 % EX PADS
6.0000 | MEDICATED_PAD | Freq: Every day | CUTANEOUS | Status: DC
  Administered 2021-04-10: 6 via TOPICAL

## 2021-04-08 MED ORDER — HEPARIN SODIUM (PORCINE) 1000 UNIT/ML DIALYSIS: 1000.0000 [IU] | INTRAMUSCULAR | Status: DC | PRN

## 2021-04-08 MED ORDER — INSULIN GLARGINE-YFGN 100 UNIT/ML ~~LOC~~ SOLN
15.0000 [IU] | Freq: Two times a day (BID) | SUBCUTANEOUS | Status: DC
  Administered 2021-04-08 (×2): 15 [IU] via SUBCUTANEOUS
  Filled 2021-04-08 (×5): qty 0.15

## 2021-04-08 MED ORDER — ALTEPLASE 2 MG IJ SOLR: 2.0000 mg | Freq: Once | INTRAMUSCULAR | Status: DC | PRN

## 2021-04-08 MED ORDER — PENTAFLUOROPROP-TETRAFLUOROETH EX AERO: 1.0000 "application " | INHALATION_SPRAY | CUTANEOUS | Status: DC | PRN

## 2021-04-08 MED ORDER — PREDNISONE 20 MG PO TABS: 20.0000 mg | ORAL_TABLET | Freq: Every day | ORAL | Status: DC

## 2021-04-08 MED ORDER — PREDNISONE 5 MG PO TABS: 30.0000 mg | ORAL_TABLET | Freq: Every day | ORAL | Status: DC

## 2021-04-08 MED ORDER — PREDNISONE 5 MG PO TABS
30.0000 mg | ORAL_TABLET | Freq: Every day | ORAL | Status: AC
  Administered 2021-04-09 – 2021-04-10 (×2): 30 mg via ORAL
  Filled 2021-04-08: qty 2
  Filled 2021-04-08: qty 1

## 2021-04-08 MED ORDER — LIDOCAINE-PRILOCAINE 2.5-2.5 % EX CREA: 1.0000 "application " | TOPICAL_CREAM | CUTANEOUS | Status: DC | PRN

## 2021-04-08 MED ORDER — LIDOCAINE HCL (PF) 1 % IJ SOLN: 5.0000 mL | INTRAMUSCULAR | Status: DC | PRN

## 2021-04-08 MED ORDER — PREDNISONE 5 MG PO TABS: 10.0000 mg | ORAL_TABLET | Freq: Every day | ORAL | Status: DC

## 2021-04-08 NOTE — Progress Notes (Signed)
PROGRESS NOTE    Christopher Davila  DDU:[id number] DOB: 1970/06/26 DOA: 03/26/2021 PCP: [name], MD   Brief Narrative:  Patient is a 50 year old African-American male, resident in a facility, with past medical history significant for end-stage renal disease on hemodialysis on Monday, Wednesday and Friday; hypertension, anemia, left eye blindness, diabetes mellitus with gastroparesis, diastolic congestive heart failure and pancreatitis.  Patient was admitted with shortness of breath and requiring significant amount of supplemental oxygen.  Patient was placed on BiPAP on presentation and admitted to the intensive care unit.  Patient was eventually transferred to the hospitalist team..  Patient tested positive for COVID 19.  At some point, patient was on 20 L of supplemental oxygen.  Patient was also noted to be volume overloaded on presentation.  Patient has been on hemodialysis Monday, Wednesday and Friday.  Patient is currently on 3.5 L of oxygen via nasal cannula.  Volume overload has improved significantly.  Patient seems close to baseline.  Patient became significantly hypoglycemic yesterday, with blood sugar of 24.  Long-acting insulin was held and, sliding scale insulin coverage was continued.  The patient's blood sugars noted to be elevated today.  We will introduce long-acting insulin carefully.  Continue to optimize diabetes mellitus management.  Will consult transition of care team.     Assessment & Plan:   Active Problems:   Acute respiratory failure with hypoxia (HCC)   History of thoracentesis   S/P thoracentesis   Acute on chronic hypoxic Respiratory Failure/COVID-19 pneumonitis/pulmonary edema/fluid overload: -Multifactorial, patient was on 20 L of high flow oxygen at some point. -Patient completed 5/5 days of remdesivir on 03/31/2019. -Will taper of prednisone over the next 6 days (30 Mg p.o. once daily for 2 days, then 20 Mg p.o. once daily for 2 days, then 10 Mg p.o. once  daily for 2 days and stop). -Respiratory status has improved significantly.  Patient is currently on 3.5 L of supplemental oxygen via nasal cannula.  Patient has also been dialyzed Monday, Wednesday and Friday. -For hemodialysis in the morning.    Essential hypertension: -Continue amlodipine and hydralazine, blood pressure controlled.  Beta-blockers on hold. -Goal blood pressure should be less than 130/80 mmHg.  ESRD on HD: -Patient is on hemodialysis Monday, Wednesday and Friday. -Nephrology team is managing.    Hypoglycemia: -Resolved. -Blood sugar seems to be on the high side today. -We will carefully reintroduce long-acting insulin.    Hyperlipidemia: -Continue atorvastatin.     DM type II: -Blood sugar has been uncontrolled. -We will continue to optimize. -Patient is currently on long-acting insulin as well as sliding scale insulin coverage.  Anemia of chronic disease: -Stable.  DVT prophylaxis: heparin injection 5,000 Units Start: 03/26/21 2200 SCDs Start: 03/26/21 2376   Code Status: Full Code  Family Communication:    Status is: Inpatient  Remains inpatient appropriate because:Inpatient level of care appropriate due to severity of illness  Dispo: The patient is from: SNF              Anticipated d/c is to: SNF              Patient currently is not medically stable to d/c.   Difficult to place patient No   Estimated body mass index is 22.61 kg/m as calculated from the following:   Height as of 03/16/21: 5\' 3"  (1.6 m).   Weight as of this encounter: 57.9 kg.  Nutritional Assessment: Body mass index is 22.61 kg/m.Marland Kitchen Seen by dietician.  I agree  with the assessment and plan as outlined below: Nutrition Status:   Consultants:  Nephrology  Procedures:  None  Antimicrobials:  Anti-infectives (From admission, onward)    Start     Dose/Rate Route Frequency Ordered Stop   03/30/21 1400  cefTRIAXone (ROCEPHIN) 2 g in sodium chloride 0.9 % 100 mL IVPB         2 g 200 mL/hr over 30 Minutes Intravenous Every 24 hours 03/30/21 1233 04/03/21 1742   03/30/21 1400  azithromycin (ZITHROMAX) 500 mg in sodium chloride 0.9 % 250 mL IVPB        500 mg 250 mL/hr over 60 Minutes Intravenous Every 24 hours 03/30/21 1233 04/03/21 1853   03/27/21 1000  remdesivir 100 mg in sodium chloride 0.9 % 100 mL IVPB       See Hyperspace for full Linked Orders Report.   100 mg 200 mL/hr over 30 Minutes Intravenous Daily 03/26/21 0921 03/31/21 0739   03/26/21 0930  remdesivir 200 mg in sodium chloride 0.9% 250 mL IVPB       See Hyperspace for full Linked Orders Report.   200 mg 580 mL/hr over 30 Minutes Intravenous Once 03/26/21 0921 03/26/21 1528   03/26/21 0715  cefTRIAXone (ROCEPHIN) 1 g in sodium chloride 0.9 % 100 mL IVPB        1 g 200 mL/hr over 30 Minutes Intravenous  Once 03/26/21 0703 03/26/21 0933          Subjective: Seen and examined.  He has no complaints.  Still on 20 L but 30% FiO2.  Objective: Vitals:   04/08/21 1159 04/08/21 1355 04/08/21 1700 04/08/21 1825  BP: (!) 146/72 (!) 141/76    Pulse: 94 91 85   Resp: 20 20 13    Temp: 98.5 F (36.9 C)   98.6 F (37 C)  TempSrc: Oral   Oral  SpO2: 96% 99% 100%   Weight:        Intake/Output Summary (Last 24 hours) at 04/08/2021 1943 Last data filed at 04/08/2021 1600 Gross per 24 hour  Intake 900 ml  Output 275 ml  Net 625 ml    Filed Weights   04/07/21 0343 04/08/21 0354 04/08/21 0648  Weight: 53 kg 57.7 kg 57.9 kg    Examination:  General exam: Appears calm and comfortable, legally blind Respiratory system: Diminished breath sounds bilaterally, respiratory effort normal. Cardiovascular system: S1 & S2  Gastrointestinal system: Abdomen is soft and nontender. No organomegaly or masses felt. Normal bowel sounds heard. Central nervous system: Alert and oriented.  Patient moves all extremities.   Extremities: No significant leg edema.  Data Reviewed: I have personally reviewed  following labs and imaging studies  CBC: Recent Labs  Lab 04/03/21 0208 04/05/21 0317 04/06/21 2133  WBC 16.2* 17.9* 22.0*  HGB 9.5* 9.3* 9.7*  HCT 29.0* 28.4* 29.1*  MCV 84.1 86.1 83.6  PLT 171 220 102    Basic Metabolic Panel: Recent Labs  Lab 04/03/21 0208 04/05/21 0132 04/06/21 2133  NA 128* 130* 133*  K 4.4 3.8 3.8  CL 93* 94* 97*  CO2 23 23 27   GLUCOSE 300* 263* 144*  BUN 77* 57* 19  CREATININE 5.91* 4.59* 2.06*  CALCIUM 8.4* 8.7* 8.6*  PHOS 3.5 3.2 1.5*    GFR: Estimated Creatinine Clearance: 34.9 mL/min (A) (by C-G formula based on SCr of 2.06 mg/dL (H)). Liver Function Tests: Recent Labs  Lab 04/03/21 0208 04/05/21 0132 04/06/21 2133  ALBUMIN 2.8* 2.9* 3.2*  No results for input(s): LIPASE, AMYLASE in the last 168 hours. No results for input(s): AMMONIA in the last 168 hours. Coagulation Profile: No results for input(s): INR, PROTIME in the last 168 hours. Cardiac Enzymes: No results for input(s): CKTOTAL, CKMB, CKMBINDEX, TROPONINI in the last 168 hours. BNP (last 3 results) No results for input(s): PROBNP in the last 8760 hours. HbA1C: No results for input(s): HGBA1C in the last 72 hours. CBG: Recent Labs  Lab 04/08/21 0345 04/08/21 0744 04/08/21 1207 04/08/21 1354 04/08/21 1559  GLUCAP 303* 228* 204* 228* 262*    Lipid Profile: No results for input(s): CHOL, HDL, LDLCALC, TRIG, CHOLHDL, LDLDIRECT in the last 72 hours. Thyroid Function Tests: No results for input(s): TSH, T4TOTAL, FREET4, T3FREE, THYROIDAB in the last 72 hours. Anemia Panel: No results for input(s): VITAMINB12, FOLATE, FERRITIN, TIBC, IRON, RETICCTPCT in the last 72 hours.  Sepsis Labs: No results for input(s): PROCALCITON, LATICACIDVEN in the last 168 hours.   No results found for this or any previous visit (from the past 240 hour(s)).      Radiology Studies: No results found.  Scheduled Meds:  acetaminophen  1,000 mg Oral Once   amLODipine  10 mg Oral  Daily   aspirin  81 mg Oral Daily   atorvastatin  40 mg Oral Daily   brimonidine  1 drop Left Eye BID   [START ON 04/09/2021] Chlorhexidine Gluconate Cloth  6 each Topical Q0600   darbepoetin (ARANESP) injection - DIALYSIS  60 mcg Intravenous Q Fri-HD   docusate sodium  100 mg Oral BID   dorzolamide-timolol  1 drop Left Eye BID   heparin injection (subcutaneous)  5,000 Units Subcutaneous Q8H   hydrALAZINE  25 mg Oral Q8H   insulin aspart  0-6 Units Subcutaneous Q4H   insulin glargine-yfgn  15 Units Subcutaneous BID   latanoprost  1 drop Left Eye QHS   mupirocin ointment   Nasal BID   olopatadine  2 drop Both Eyes BID   polyethylene glycol  17 g Oral Daily   polyvinyl alcohol  1 drop Both Eyes TID   predniSONE  50 mg Oral Daily   pregabalin  25 mg Oral BID   senna  1 tablet Oral QHS   sertraline  25 mg Oral Daily   Continuous Infusions:  sodium chloride     sodium chloride         LOS: 13 days   Time spent: 25 minutes   Bonnell Public, MD [facility]  04/08/2021, 7:43 PM  Please page via [facility] and do not message via secure chat for anything urgent. Secure chat can be used for anything non urgent.  How to contact the [facility] Attending or Consulting provider [place] or covering provider during after hours [place], for this patient?  Check the care team in [facility] and look for a) attending/consulting [facility] provider listed and b) the [facility] team listed. Page or secure chat [phone]. Log into [email or web address] and use [place]'s universal password to access. If you do not have the password, please contact the hospital operator. Locate the [facility] provider you are looking for under [facility] and page to a number that you can be directly reached. If you still have difficulty reaching the provider, please page the [facility] (Director on Call) for the Hospitalists listed on amion for assistance.

## 2021-04-08 NOTE — Progress Notes (Addendum)
[facility] Progress Note   Subjective:    Seen and examined patient at bedside. Noted small amount dry blood dry dressing of R AVF. Denies SOB, CP, N/V. Plan for HD 10/24.   Objective Vitals:   04/08/21 0648 04/08/21 0739 04/08/21 1100 04/08/21 1159  BP:  (!) 144/74  (!) 146/72  Pulse: 94 92 83 94  Resp: 12 18 12 20   Temp:  98.3 F (36.8 C)  98.5 F (36.9 C)  TempSrc:  Oral  Oral  SpO2: 100% 100% 99% 96%  Weight: 57.9 kg      Physical Exam General: WDWN male, alert and NAD; On 3L [place] Heart: RRR, no murmur Lungs: CTA bilaterally with diminished breath sounds within lower lobes Abdomen: Soft, non-tender, non-distended, +BS Extremities: No edema b/l lower extremities Dialysis Access: RUE AVT + bruit, dressing with small amount dry blood, does not appear to be actively bleeding   Filed Weights   04/07/21 0343 04/08/21 0354 04/08/21 0648  Weight: 53 kg 57.7 kg 57.9 kg    Intake/Output Summary (Last 24 hours) at 04/08/2021 1221 Last data filed at 04/08/2021 0900 Gross per 24 hour  Intake 1020 ml  Output 200 ml  Net 820 ml    Additional Objective Labs: Basic Metabolic Panel: Recent Labs  Lab 04/03/21 0208 04/05/21 0132 04/06/21 2133  NA 128* 130* 133*  K 4.4 3.8 3.8  CL 93* 94* 97*  CO2 23 23 27   GLUCOSE 300* 263* 144*  BUN 77* 57* 19  CREATININE 5.91* 4.59* 2.06*  CALCIUM 8.4* 8.7* 8.6*  PHOS 3.5 3.2 1.5*   Liver Function Tests: Recent Labs  Lab 04/03/21 0208 04/05/21 0132 04/06/21 2133  ALBUMIN 2.8* 2.9* 3.2*   No results for input(s): LIPASE, AMYLASE in the last 168 hours. CBC: Recent Labs  Lab 04/03/21 0208 04/05/21 0317 04/06/21 2133  WBC 16.2* 17.9* 22.0*  HGB 9.5* 9.3* 9.7*  HCT 29.0* 28.4* 29.1*  MCV 84.1 86.1 83.6  PLT 171 220 245   Blood Culture    Component Value Date/Time   SDES BLOOD RIGHT HAND 10/27/2019 1208   SDES BLOOD RIGHT HAND 10/27/2019 1208   SPECREQUEST  10/27/2019 1208    BOTTLES DRAWN AEROBIC AND  ANAEROBIC Blood Culture adequate volume   SPECREQUEST  10/27/2019 1208    BOTTLES DRAWN AEROBIC AND ANAEROBIC Blood Culture adequate volume   CULT  10/27/2019 1208    NO GROWTH 5 DAYS Performed at [facility], [place] [address], [place], [place] [address]    CULT  10/27/2019 1208    NO GROWTH 5 DAYS Performed at [facility], [place] [address] [place], [place] [address]    REPTSTATUS 11/01/2019 FINAL 10/27/2019 1208   REPTSTATUS 11/01/2019 FINAL 10/27/2019 1208    Cardiac Enzymes: No results for input(s): CKTOTAL, CKMB, CKMBINDEX, TROPONINI in the last 168 hours. CBG: Recent Labs  Lab 04/07/21 1940 04/07/21 2347 04/08/21 0345 04/08/21 0744 04/08/21 1207  GLUCAP 282* 326* 303* 228* 204*   Iron Studies: No results for input(s): IRON, TIBC, TRANSFERRIN, FERRITIN in the last 72 hours. Lab Results  Component Value Date   INR 1.1 10/13/2019   INR 1.0 06/16/2019   INR 1.0 02/18/2019   Studies/Results: No results found.  Medications:   acetaminophen  1,000 mg Oral Once   amLODipine  10 mg Oral Daily   aspirin  81 mg Oral Daily   atorvastatin  40 mg Oral Daily   brimonidine  1 drop Left Eye  BID   darbepoetin (ARANESP) injection - DIALYSIS  60 mcg Intravenous Q Fri-HD   docusate sodium  100 mg Oral BID   dorzolamide-timolol  1 drop Left Eye BID   heparin injection (subcutaneous)  5,000 Units Subcutaneous Q8H   hydrALAZINE  25 mg Oral Q8H   insulin aspart  0-6 Units Subcutaneous Q4H   insulin glargine-yfgn  15 Units Subcutaneous BID   latanoprost  1 drop Left Eye QHS   mupirocin ointment   Nasal BID   olopatadine  2 drop Both Eyes BID   polyethylene glycol  17 g Oral Daily   polyvinyl alcohol  1 drop Both Eyes TID   predniSONE  50 mg Oral Daily   pregabalin  25 mg Oral BID   senna  1 tablet Oral QHS   sertraline  25 mg Oral Daily    Dialysis Orders: Outpatient orders: [facility], MWF, 4 hrs, F180, 2k, 2cal, edw 53kg, no heparin  Assessment/Plan: 1) ESRD:   Intermittent HD refusal, now back on track. Tolerated yesterday's HD with net UF 2.6L. Noted dry blood from dry dressing of R AVF. Doesn't appear to be actively bleeding. Noted documented episode of AVF bleeding at night previously after HD. Continue no heparin and will monitor this closely. Plan for HD 10/24.   2)  AHRF COVID positive 03/24/2021 Pulmonary edema, left pleural effusion - completed 4d course of remdesivir per primary service, on pred -thora 10/10: 1200cc drained 3)  Hyponatremia -likely hypervolemic, improving with HD 4)  Volume/ hypertension: EDW 53kg.- now under his EDW. CXR is improved and O2 requirement is decreasing. UF as tolerated 5)  Anemia of Chronic Kidney Disease: Hemoglobin 9.7, Receiving Mircera 79mcg qtreatment, last dose 10/3 as an outpatient. Continue Aranesp here q Friday with HD. 6) Secondary Hyperparathyroidism/Hyperphosphatemia: Calcium controlled. Phos running low, discontinuing his binders for now.  8) Additional recommendations: - Dose all meds for creatinine clearance < 10 ml/min  - Unless absolutely necessary, no MRIs with gadolinium.  -  No blood pressure measurements in rt arm. - If blood transfusion is requested during hemodialysis sessions, please alert [place] prior to the session  Tobie Poet, NP [facility] 04/08/2021,12:21 PM  LOS: 13 days

## 2021-04-09 ENCOUNTER — Encounter (HOSPITAL_COMMUNITY): Payer: Self-pay | Admitting: Internal Medicine

## 2021-04-09 DIAGNOSIS — J9601 Acute respiratory failure with hypoxia: Secondary | ICD-10-CM | POA: Diagnosis not present

## 2021-04-09 DIAGNOSIS — Z9889 Other specified postprocedural states: Secondary | ICD-10-CM | POA: Diagnosis not present

## 2021-04-09 LAB — CBC WITH DIFFERENTIAL/PLATELET
Abs Immature Granulocytes: 0.84 10*3/uL — ABNORMAL HIGH (ref 0.00–0.07)
Basophils Absolute: 0.1 10*3/uL (ref 0.0–0.1)
Basophils Relative: 0 %
Eosinophils Absolute: 0 10*3/uL (ref 0.0–0.5)
Eosinophils Relative: 0 %
HCT: 25.7 % — ABNORMAL LOW (ref 39.0–52.0)
Hemoglobin: 8.2 g/dL — ABNORMAL LOW (ref 13.0–17.0)
Immature Granulocytes: 3 %
Lymphocytes Relative: 3 %
Lymphs Abs: 0.9 10*3/uL (ref 0.7–4.0)
MCH: 28 pg (ref 26.0–34.0)
MCHC: 31.9 g/dL (ref 30.0–36.0)
MCV: 87.7 fL (ref 80.0–100.0)
Monocytes Absolute: 1.2 10*3/uL — ABNORMAL HIGH (ref 0.1–1.0)
Monocytes Relative: 4 %
Neutro Abs: 26.6 10*3/uL — ABNORMAL HIGH (ref 1.7–7.7)
Neutrophils Relative %: 90 %
Platelets: 215 10*3/uL (ref 150–400)
RBC: 2.93 MIL/uL — ABNORMAL LOW (ref 4.22–5.81)
RDW: 15.9 % — ABNORMAL HIGH (ref 11.5–15.5)
WBC: 29.5 10*3/uL — ABNORMAL HIGH (ref 4.0–10.5)
nRBC: 0.1 % (ref 0.0–0.2)

## 2021-04-09 LAB — GLUCOSE, CAPILLARY
Glucose-Capillary: 200 mg/dL — ABNORMAL HIGH (ref 70–99)
Glucose-Capillary: 58 mg/dL — ABNORMAL LOW (ref 70–99)
Glucose-Capillary: 110 mg/dL — ABNORMAL HIGH (ref 70–99)
Glucose-Capillary: 124 mg/dL — ABNORMAL HIGH (ref 70–99)
Glucose-Capillary: 143 mg/dL — ABNORMAL HIGH (ref 70–99)

## 2021-04-09 LAB — RENAL FUNCTION PANEL
Albumin: 3 g/dL — ABNORMAL LOW (ref 3.5–5.0)
Anion gap: 11 (ref 5–15)
BUN: 69 mg/dL — ABNORMAL HIGH (ref 6–20)
CO2: 22 mmol/L (ref 22–32)
Calcium: 8.4 mg/dL — ABNORMAL LOW (ref 8.9–10.3)
Chloride: 95 mmol/L — ABNORMAL LOW (ref 98–111)
Creatinine, Ser: 4.66 mg/dL — ABNORMAL HIGH (ref 0.61–1.24)
GFR, Estimated: 15 mL/min — ABNORMAL LOW (ref >60)
Glucose, Bld: 337 mg/dL — ABNORMAL HIGH (ref 70–99)
Phosphorus: 2.2 mg/dL — ABNORMAL LOW (ref 2.5–4.6)
Potassium: 4.2 mmol/L (ref 3.5–5.1)
Sodium: 128 mmol/L — ABNORMAL LOW (ref 135–145)

## 2021-04-09 MED ORDER — INSULIN GLARGINE-YFGN 100 UNIT/ML ~~LOC~~ SOLN
25.0000 [IU] | Freq: Two times a day (BID) | SUBCUTANEOUS | Status: DC
  Administered 2021-04-09: 25 [IU] via SUBCUTANEOUS
  Filled 2021-04-09 (×2): qty 0.25

## 2021-04-09 MED ORDER — HYDRALAZINE HCL 50 MG PO TABS
50.0000 mg | ORAL_TABLET | Freq: Three times a day (TID) | ORAL | Status: DC
  Administered 2021-04-09 – 2021-04-10 (×4): 50 mg via ORAL
  Filled 2021-04-09 (×4): qty 1

## 2021-04-09 MED ORDER — INSULIN GLARGINE-YFGN 100 UNIT/ML ~~LOC~~ SOLN
15.0000 [IU] | Freq: Two times a day (BID) | SUBCUTANEOUS | Status: DC
  Administered 2021-04-09 – 2021-04-10 (×2): 15 [IU] via SUBCUTANEOUS
  Filled 2021-04-09 (×4): qty 0.15

## 2021-04-09 MED ORDER — INSULIN GLARGINE-YFGN 100 UNIT/ML ~~LOC~~ SOLN: 15.0000 [IU] | Freq: Two times a day (BID) | SUBCUTANEOUS | Status: DC

## 2021-04-09 NOTE — Progress Notes (Signed)
[facility] NEPHROLOGY PROGRESS NOTE  Assessment/ Plan:  Dialysis Orders: Outpatient orders: [facility], MWF, 4 hrs, F180, 2k, 2cal, edw 53kg, no heparin  #Acute on chronic hypoxic respiratory failure/COVID-19 pneumonia/acute pulmonary edema: He completed 5 days of remdesivir on 10/14 and now on tapering dose of prednisone.  Respiratory status is gradually improving and now only on supplemental oxygen via nasal cannula.  We will manage volume with dialysis.  # ESRD, MWF schedule: Intermittent refusal of dialysis.  No bleeding from AV fistula site.  Plan for dialysis today.  # Anemia of CKD: Continue ESA.  Monitor hemoglobin.  # Secondary hyperparathyroidism: Off of binders because of low phosphorus.  Monitor calcium, phosphorus level.  # HTN/volume: Now under EDW.  UF as tolerated.  He is on antihypertensives.  Subjective: Seen and examined.  He reports doing well without any complaint.  Denies chest pain, shortness of breath.  On oxygen by nasal cannula. Objective Vital signs in last 24 hours: Vitals:   04/09/21 0336 04/09/21 0628 04/09/21 0813 04/09/21 1204  BP: (!) 158/87  (!) 165/84 (!) 158/79  Pulse: 84  86 90  Resp: 17  14 19   Temp: 98.6 F (37 C)  97.6 F (36.4 C)   TempSrc: Oral  Oral Oral  SpO2: 100%  100% 97%  Weight:  59.1 kg     Weight change: 1.4 kg  Intake/Output Summary (Last 24 hours) at 04/09/2021 1258 Last data filed at 04/09/2021 0908 Gross per 24 hour  Intake 360 ml  Output 225 ml  Net 135 ml       Labs: Basic Metabolic Panel: Recent Labs  Lab 04/05/21 0132 04/06/21 2133 04/09/21 0127  NA 130* 133* 128*  K 3.8 3.8 4.2  CL 94* 97* 95*  CO2 23 27 22   GLUCOSE 263* 144* 337*  BUN 57* 19 69*  CREATININE 4.59* 2.06* 4.66*  CALCIUM 8.7* 8.6* 8.4*  PHOS 3.2 1.5* 2.2*   Liver Function Tests: Recent Labs  Lab 04/05/21 0132 04/06/21 2133 04/09/21 0127  ALBUMIN 2.9* 3.2* 3.0*   No results for input(s): LIPASE, AMYLASE in the  last 168 hours. No results for input(s): AMMONIA in the last 168 hours. CBC: Recent Labs  Lab 04/03/21 0208 04/05/21 0317 04/06/21 2133 04/09/21 0127  WBC 16.2* 17.9* 22.0* 29.5*  NEUTROABS  --   --   --  26.6*  HGB 9.5* 9.3* 9.7* 8.2*  HCT 29.0* 28.4* 29.1* 25.7*  MCV 84.1 86.1 83.6 87.7  PLT 171 220 245 215   Cardiac Enzymes: No results for input(s): CKTOTAL, CKMB, CKMBINDEX, TROPONINI in the last 168 hours. CBG: Recent Labs  Lab 04/08/21 2328 04/09/21 0337 04/09/21 0819 04/09/21 0914 04/09/21 1213  GLUCAP 367* 200* 58* 110* 124*    Iron Studies: No results for input(s): IRON, TIBC, TRANSFERRIN, FERRITIN in the last 72 hours. Studies/Results: No results found.  Medications: Infusions:  sodium chloride     sodium chloride      Scheduled Medications:  acetaminophen  1,000 mg Oral Once   amLODipine  10 mg Oral Daily   aspirin  81 mg Oral Daily   atorvastatin  40 mg Oral Daily   brimonidine  1 drop Left Eye BID   Chlorhexidine Gluconate Cloth  6 each Topical Q0600   darbepoetin (ARANESP) injection - DIALYSIS  60 mcg Intravenous Q Fri-HD   docusate sodium  100 mg Oral BID   dorzolamide-timolol  1 drop Left Eye BID   heparin injection (subcutaneous)  5,000  Units Subcutaneous Q8H   hydrALAZINE  50 mg Oral Q8H   insulin aspart  0-6 Units Subcutaneous Q4H   insulin glargine-yfgn  15 Units Subcutaneous BID   latanoprost  1 drop Left Eye QHS   mupirocin ointment   Nasal BID   olopatadine  2 drop Both Eyes BID   polyethylene glycol  17 g Oral Daily   polyvinyl alcohol  1 drop Both Eyes TID   predniSONE  30 mg Oral Q breakfast   Followed by   Derrill Memo ON 04/11/2021] predniSONE  20 mg Oral Q breakfast   Followed by   Derrill Memo ON 04/13/2021] predniSONE  10 mg Oral Q breakfast   pregabalin  25 mg Oral BID   senna  1 tablet Oral QHS   sertraline  25 mg Oral Daily    have reviewed scheduled and prn medications.  Physical Exam: General:NAD, comfortable Heart:RRR,  s1s2 nl Lungs:clear b/l, no crackle Abdomen:soft, Non-tender, non-distended Extremities:No edema Dialysis Access: Right upper extremity AV fistula has good thrill and bruit, no sign of active bleed.  Allycia Pitz Prasad Andrell Bergeson 04/09/2021,12:58 PM  LOS: 14 days

## 2021-04-09 NOTE — Plan of Care (Signed)
  Problem: Education: Goal: Knowledge of General Education information will improve Description: Including pain rating scale, medication(s)/side effects and non-pharmacologic comfort measures Outcome: Progressing  Problem: Clinical Measurements: Goal: Respiratory complications will improve Outcome: Progressing   Problem: Clinical Measurements: Goal: Cardiovascular complication will be avoided Outcome: Progressing   Problem: Nutrition: Goal: Adequate nutrition will be maintained Outcome: Progressing   Problem: Coping: Goal: Level of anxiety will decrease Outcome: Progressing   Problem: Elimination: Goal: Will not experience complications related to bowel motility Outcome: Progressing   Problem: Pain Managment: Goal: General experience of comfort will improve Outcome: Progressing   Problem: Safety: Goal: Ability to remain free from injury will improve Outcome: Progressing

## 2021-04-09 NOTE — Progress Notes (Signed)
PROGRESS NOTE    Christopher Davila  YQM:[id number] DOB: 1970-11-10 DOA: 03/26/2021 PCP: [name], MD   Brief Narrative:  Patient is a 50 year old African-American male, resident in a facility, with past medical history significant for end-stage renal disease on hemodialysis on Monday, Wednesday and Friday; hypertension, anemia, left eye blindness, diabetes mellitus with gastroparesis, diastolic congestive heart failure and pancreatitis.  Patient was admitted with shortness of breath and requiring significant amount of supplemental oxygen.  Patient was placed on BiPAP on presentation and admitted to the intensive care unit.  Patient was eventually transferred to the hospitalist team..  Patient tested positive for COVID 19.  At some point, patient was on 20 L of supplemental oxygen.  Patient was also noted to be volume overloaded on presentation.  Patient has been on hemodialysis Monday, Wednesday and Friday.  Patient is currently on 3.5 L of oxygen via nasal cannula.  Volume overload has improved significantly.  Patient seems close to baseline.  Patient became significantly hypoglycemic yesterday, with blood sugar of 24.  Long-acting insulin was held and, sliding scale insulin coverage was continued.  The patient's blood sugars noted to be elevated today.  We will introduce long-acting insulin carefully.  Continue to optimize diabetes mellitus management.  Will consult transition of care team.     Assessment & Plan:   Active Problems:   Acute respiratory failure with hypoxia (HCC)   History of thoracentesis   S/P thoracentesis   Shortness of breath   COVID-19   Hypoxia   End-stage renal disease on hemodialysis (HCC)   Hypervolemia   Acute on chronic hypoxic Respiratory Failure/COVID-19 pneumonitis/pulmonary edema/fluid overload: -Multifactorial, patient was on 20 L of high flow oxygen at some point. -Patient completed 5/5 days of remdesivir on 03/31/2019. -Will taper of prednisone over  the next 6 days (30 Mg p.o. once daily for 2 days, then 20 Mg p.o. once daily for 2 days, then 10 Mg p.o. once daily for 2 days and stop). -Respiratory status has improved significantly.  Patient is currently on 3. L of supplemental oxygen via nasal cannula.  Patient has also been dialyzed Monday, Wednesday and Friday.  Essential hypertension: -Blood pressure slightly elevated.  Continue amlodipine but increase hydralazine to 50 mg 3 times daily.  Beta-blockers on hold.   -Goal blood pressure should be less than 130/80 mmHg.  ESRD on HD: -Patient is on hemodialysis Monday, Wednesday and Friday. -Nephrology team is managing.    Hypoglycemia: -Resolved. -Blood sugar seems to be on the high side today. -We will carefully reintroduce long-acting insulin.    Hyperlipidemia: -Continue atorvastatin.     Uncontrolled DM type II with labile blood pressure with intermittent hypoglycemia and hyperglycemia: Very labile blood sugar.  Hyperglycemic yesterday but hypoglycemic this morning.  I realized that he is on renal diet but not consistent carbohydrate diet.  Patient is known to be noncompliant with his diet and orders sugary things whenever he wants.  We will put him back on consistent carbohydrate diet.  We will continue Semglee 15 units twice daily for now along with SSI.  Monitor overnight.  Anemia of chronic disease: -Stable.  DVT prophylaxis: heparin injection 5,000 Units Start: 03/26/21 2200 SCDs Start: 03/26/21 8891   Code Status: Full Code  Family Communication:    Status is: Inpatient  Remains inpatient appropriate because:Inpatient level of care appropriate due to severity of illness  Dispo: The patient is from: SNF  Anticipated d/c is to: SNF              Patient currently is not medically stable to d/c.   Difficult to place patient No   Estimated body mass index is 23.08 kg/m as calculated from the following:   Height as of 03/16/21: 5\' 3"  (1.6 m).   Weight as  of this encounter: 59.1 kg.  Nutritional Assessment: Body mass index is 23.08 kg/m.Marland Kitchen Seen by dietician.  I agree with the assessment and plan as outlined below: Nutrition Status:   Consultants:  Nephrology  Procedures:  None  Antimicrobials:  Anti-infectives (From admission, onward)    Start     Dose/Rate Route Frequency Ordered Stop   03/30/21 1400  cefTRIAXone (ROCEPHIN) 2 g in sodium chloride 0.9 % 100 mL IVPB        2 g 200 mL/hr over 30 Minutes Intravenous Every 24 hours 03/30/21 1233 04/03/21 1742   03/30/21 1400  azithromycin (ZITHROMAX) 500 mg in sodium chloride 0.9 % 250 mL IVPB        500 mg 250 mL/hr over 60 Minutes Intravenous Every 24 hours 03/30/21 1233 04/03/21 1853   03/27/21 1000  remdesivir 100 mg in sodium chloride 0.9 % 100 mL IVPB       See Hyperspace for full Linked Orders Report.   100 mg 200 mL/hr over 30 Minutes Intravenous Daily 03/26/21 0921 03/31/21 0739   03/26/21 0930  remdesivir 200 mg in sodium chloride 0.9% 250 mL IVPB       See Hyperspace for full Linked Orders Report.   200 mg 580 mL/hr over 30 Minutes Intravenous Once 03/26/21 0921 03/26/21 1528   03/26/21 0715  cefTRIAXone (ROCEPHIN) 1 g in sodium chloride 0.9 % 100 mL IVPB        1 g 200 mL/hr over 30 Minutes Intravenous  Once 03/26/21 0703 03/26/21 0933          Subjective:  Patient seen and examined.  He has no complaints.  Objective: Vitals:   04/08/21 2024 04/09/21 0336 04/09/21 0628 04/09/21 0813  BP: 134/75 (!) 158/87  (!) 165/84  Pulse: 88 84  86  Resp: 15 17  14   Temp: 98.3 F (36.8 C) 98.6 F (37 C)  97.6 F (36.4 C)  TempSrc: Oral Oral  Oral  SpO2: 100% 100%  100%  Weight:   59.1 kg     Intake/Output Summary (Last 24 hours) at 04/09/2021 1153 Last data filed at 04/09/2021 0908 Gross per 24 hour  Intake 480 ml  Output 225 ml  Net 255 ml    Filed Weights   04/08/21 0354 04/08/21 0648 04/09/21 0628  Weight: 57.7 kg 57.9 kg 59.1 kg     Examination:   General exam: Appears calm and comfortable  Respiratory system: Diminished breath sounds bilaterally. Respiratory effort normal. Cardiovascular system: S1 & S2 heard, RRR. No JVD, murmurs, rubs, gallops or clicks. No pedal edema. Gastrointestinal system: Abdomen is nondistended, soft and nontender. No organomegaly or masses felt. Normal bowel sounds heard. Central nervous system: Alert and oriented. No focal neurological deficits. Extremities: Symmetric 5 x 5 power. Skin: No rashes, lesions or ulcers.  Psychiatry: Judgement and insight appear poor   Data Reviewed: I have personally reviewed following labs and imaging studies  CBC: Recent Labs  Lab 04/03/21 0208 04/05/21 0317 04/06/21 2133 04/09/21 0127  WBC 16.2* 17.9* 22.0* 29.5*  NEUTROABS  --   --   --  26.6*  HGB 9.5* 9.3*  9.7* 8.2*  HCT 29.0* 28.4* 29.1* 25.7*  MCV 84.1 86.1 83.6 87.7  PLT 171 220 245 115    Basic Metabolic Panel: Recent Labs  Lab 04/03/21 0208 04/05/21 0132 04/06/21 2133 04/09/21 0127  NA 128* 130* 133* 128*  K 4.4 3.8 3.8 4.2  CL 93* 94* 97* 95*  CO2 23 23 27 22   GLUCOSE 300* 263* 144* 337*  BUN 77* 57* 19 69*  CREATININE 5.91* 4.59* 2.06* 4.66*  CALCIUM 8.4* 8.7* 8.6* 8.4*  PHOS 3.5 3.2 1.5* 2.2*    GFR: Estimated Creatinine Clearance: 15.4 mL/min (A) (by C-G formula based on SCr of 4.66 mg/dL (H)). Liver Function Tests: Recent Labs  Lab 04/03/21 0208 04/05/21 0132 04/06/21 2133 04/09/21 0127  ALBUMIN 2.8* 2.9* 3.2* 3.0*    No results for input(s): LIPASE, AMYLASE in the last 168 hours. No results for input(s): AMMONIA in the last 168 hours. Coagulation Profile: No results for input(s): INR, PROTIME in the last 168 hours. Cardiac Enzymes: No results for input(s): CKTOTAL, CKMB, CKMBINDEX, TROPONINI in the last 168 hours. BNP (last 3 results) No results for input(s): PROBNP in the last 8760 hours. HbA1C: No results for input(s): HGBA1C in the last 72  hours. CBG: Recent Labs  Lab 04/08/21 2022 04/08/21 2328 04/09/21 0337 04/09/21 0819 04/09/21 0914  GLUCAP 390* 367* 200* 58* 110*    Lipid Profile: No results for input(s): CHOL, HDL, LDLCALC, TRIG, CHOLHDL, LDLDIRECT in the last 72 hours. Thyroid Function Tests: No results for input(s): TSH, T4TOTAL, FREET4, T3FREE, THYROIDAB in the last 72 hours. Anemia Panel: No results for input(s): VITAMINB12, FOLATE, FERRITIN, TIBC, IRON, RETICCTPCT in the last 72 hours.  Sepsis Labs: No results for input(s): PROCALCITON, LATICACIDVEN in the last 168 hours.   No results found for this or any previous visit (from the past 240 hour(s)).      Radiology Studies: No results found.  Scheduled Meds:  acetaminophen  1,000 mg Oral Once   amLODipine  10 mg Oral Daily   aspirin  81 mg Oral Daily   atorvastatin  40 mg Oral Daily   brimonidine  1 drop Left Eye BID   Chlorhexidine Gluconate Cloth  6 each Topical Q0600   darbepoetin (ARANESP) injection - DIALYSIS  60 mcg Intravenous Q Fri-HD   docusate sodium  100 mg Oral BID   dorzolamide-timolol  1 drop Left Eye BID   heparin injection (subcutaneous)  5,000 Units Subcutaneous Q8H   hydrALAZINE  25 mg Oral Q8H   insulin aspart  0-6 Units Subcutaneous Q4H   insulin glargine-yfgn  15 Units Subcutaneous BID   latanoprost  1 drop Left Eye QHS   mupirocin ointment   Nasal BID   olopatadine  2 drop Both Eyes BID   polyethylene glycol  17 g Oral Daily   polyvinyl alcohol  1 drop Both Eyes TID   predniSONE  30 mg Oral Q breakfast   Followed by   Derrill Memo ON 04/11/2021] predniSONE  20 mg Oral Q breakfast   Followed by   Derrill Memo ON 04/13/2021] predniSONE  10 mg Oral Q breakfast   pregabalin  25 mg Oral BID   senna  1 tablet Oral QHS   sertraline  25 mg Oral Daily   Continuous Infusions:  sodium chloride     sodium chloride         LOS: 14 days   Time spent: 27 minutes   Darliss Cheney, MD [facility]  04/09/2021, 11:53 AM  Please page via [place] and do not message via secure chat for anything urgent. Secure chat can be used for anything non urgent.  How to contact the [facility] Attending or Consulting provider [place] or covering provider during after hours [place], for this patient?  Check the care team in [facility] and look for a) attending/consulting TRH provider listed and b) the [facility] team listed. Page or secure chat [phone]. Log into [email or web address] and use [place]'s universal password to access. If you do not have the password, please contact the hospital operator. Locate the [facility] provider you are looking for under [facility] and page to a number that you can be directly reached. If you still have difficulty reaching the provider, please page the [facility] (Director on Call) for the Hospitalists listed on amion for assistance.

## 2021-04-09 NOTE — Plan of Care (Signed)
  Problem: Education: Goal: Knowledge of General Education information will improve Description: Including pain rating scale, medication(s)/side effects and non-pharmacologic comfort measures 04/09/2021 0335 by Lydia Guiles, RN Outcome: Progressing 04/09/2021 0249 by Lydia Guiles, RN Outcome: Progressing   Problem: Clinical Measurements: Goal: Will remain free from infection 04/09/2021 0335 by Lydia Guiles, RN Outcome: Progressing 04/09/2021 0249 by Lydia Guiles, RN Outcome: Progressing   Problem: Activity: Goal: Risk for activity intolerance will decrease 04/09/2021 0335 by Lydia Guiles, RN Outcome: Progressing 04/09/2021 0249 by Lydia Guiles, RN Outcome: Progressing   Problem: Nutrition: Goal: Adequate nutrition will be maintained 04/09/2021 0335 by Lydia Guiles, RN Outcome: Progressing 04/09/2021 0249 by Lydia Guiles, RN Outcome: Progressing   Problem: Elimination: Goal: Will not experience complications related to bowel motility 04/09/2021 0335 by Lydia Guiles, RN Outcome: Progressing 04/09/2021 0249 by Lydia Guiles, RN Outcome: Progressing   Problem: Coping: Goal: Level of anxiety will decrease 04/09/2021 0335 by Lydia Guiles, RN Outcome: Progressing 04/09/2021 0249 by Lydia Guiles, RN Outcome: Progressing   Problem: Pain Managment: Goal: General experience of comfort will improve 04/09/2021 0335 by Lydia Guiles, RN Outcome: Progressing 04/09/2021 0249 by Lydia Guiles, RN Outcome: Progressing   Problem: Skin Integrity: Goal: Risk for impaired skin integrity will decrease 04/09/2021 0335 by Lydia Guiles, RN Outcome: Progressing 04/09/2021 0249 by Lydia Guiles, RN Outcome: Progressing   Problem: Coping: Goal: Psychosocial and spiritual needs will be supported 04/09/2021 0335 by Lydia Guiles, RN Outcome: Progressing 04/09/2021 0249 by Lydia Guiles, RN Outcome: Progressing

## 2021-04-09 NOTE — TOC Progression Note (Signed)
Transition of Care [facility]) - Progression Note    Patient Details  Name: Christopher Davila MRN: [id number] Date of Birth: 09-18-1970  Transition of Care [facility]) CM/SW [place], LCSW Phone Number: 04/09/2021, 4:31 PM  Clinical Narrative:    CSW notified [name] that patient will likely be ready for discharge tomorrow. CSW also notified patient's Chauncey [place], [name]. Patient will require non-emergency ambulance transport.    Expected Discharge Plan: [place] Barriers to Discharge: Continued Medical Work up  Expected Discharge Plan and Services Expected Discharge Plan: [place] In-house Referral: Clinical Social Work   Post Acute Care Choice: [place] Living arrangements for the past 2 months: [place]                                       Social Determinants of Health (SDOH) Interventions    Readmission Risk Interventions Readmission Risk Prevention Plan 04/06/2021 02/19/2019  Transportation Screening Complete Complete  PCP or Specialist Appt within 5-7 Days - Complete  Home Care Screening - Complete  Medication Review (RN CM) - Complete  Medication Review Press photographer) Complete -  PCP or Specialist appointment within 3-5 days of discharge Complete -  [place] or Home Care Consult Complete -  SW Recovery Care/Counseling Consult Complete -  Palliative Care Screening Not Applicable -  [place] Complete -  Some recent data might be hidden

## 2021-04-10 DIAGNOSIS — N186 End stage renal disease: Secondary | ICD-10-CM | POA: Diagnosis not present

## 2021-04-10 DIAGNOSIS — J9601 Acute respiratory failure with hypoxia: Secondary | ICD-10-CM | POA: Diagnosis not present

## 2021-04-10 DIAGNOSIS — Z992 Dependence on renal dialysis: Secondary | ICD-10-CM | POA: Diagnosis not present

## 2021-04-10 DIAGNOSIS — U071 COVID-19: Principal | ICD-10-CM

## 2021-04-10 LAB — GLUCOSE, CAPILLARY
Glucose-Capillary: 118 mg/dL — ABNORMAL HIGH (ref 70–99)
Glucose-Capillary: 195 mg/dL — ABNORMAL HIGH (ref 70–99)
Glucose-Capillary: 304 mg/dL — ABNORMAL HIGH (ref 70–99)
Glucose-Capillary: 67 mg/dL — ABNORMAL LOW (ref 70–99)

## 2021-04-10 MED ORDER — PREDNISONE 10 MG PO TABS: 10.0000 mg | ORAL_TABLET | Freq: Every day | ORAL | 0 refills | Status: AC

## 2021-04-10 MED ORDER — OXYCODONE-ACETAMINOPHEN 5-325 MG PO TABS: 1.0000 | ORAL_TABLET | ORAL | 0 refills | Status: DC

## 2021-04-10 MED ORDER — PREDNISONE 20 MG PO TABS: 20.0000 mg | ORAL_TABLET | Freq: Every day | ORAL | 0 refills | Status: AC

## 2021-04-10 NOTE — Discharge Summary (Signed)
Physician Discharge Summary  Christopher Davila WJX:[id number] DOB: 08/05/1970 DOA: 03/26/2021  PCP: [name], MD  Admit date: 03/26/2021 Discharge date: 04/10/2021 30 Day Unplanned Readmission Risk Score    Flowsheet Row ED to Hosp-Admission (Current) from 03/26/2021 in [facility]  30 Day Unplanned Readmission Risk Score (%) 32.94 Filed at 04/10/2021 0801       This score is the patient's risk of an unplanned readmission within 30 days of being discharged (0 -100%). The score is based on dignosis, age, lab data, medications, orders, and past utilization.   Low:  0-14.9   Medium: 15-21.9   High: 22-29.9   Extreme: 30 and above          Admitted From: SNF Disposition: SNF  Recommendations for Outpatient Follow-up:  Follow up with PCP in 1-2 weeks Please obtain BMP/CBC in one week Please follow up with your PCP on the following pending results: Unresulted Labs (From admission, onward)     Start     Ordered   03/27/21 0500  CBC with Differential/Platelet  Daily,   R      03/26/21 [place]: None Equipment/Devices: None  Discharge Condition: Stable CODE STATUS: Full code Diet recommendation: Low-sodium  Subjective: Seen and examined.  He has no complaints.  Brief/Interim Summary: Patient is a 50 year old African-American male, resident in a facility, with past medical history significant for end-stage renal disease on hemodialysis on Monday, Wednesday and Friday; hypertension, anemia, left eye blindness, diabetes mellitus with gastroparesis, diastolic congestive heart failure and pancreatitis.  He was tested positive for COVID-19.  Patient was admitted with shortness of breath and requiring significant amount of supplemental oxygen.  Patient was placed on BiPAP on presentation and admitted to the intensive care unit.  Patient was eventually transferred to the hospitalist team..  At some point, patient was on 20 L of  supplemental oxygen.  Patient was also noted to be volume overloaded on presentation.  Patient has been on hemodialysis Monday, Wednesday and Friday.  It took several days for the patient to get better and reduce oxygen demand, patient is currently on 2 L of oxygen via nasal cannula.  This was secondary to combination of COVID-19 pneumonitis as well as volume overload.  Volume overload has improved significantly.  Patient seems close to baseline.  Prior to hospitalization, patient was not on any medications for diabetes.  His hemoglobin A1c was only 4.6.  His blood sugar was elevated here, likely because of being noncompliant with his diet.  It was reported that someone was providing the patient food from outside which was typically junk food.  Patient did not comply with consistent carbohydrate diet and thus his blood sugar remained labile.  This morning he was in 70s blood sugar.  He is otherwise stable and is being discharged back to SNF.  Acute on chronic hypoxic Respiratory Failure/COVID-19 pneumonitis/pulmonary edema/fluid overload: -Multifactorial, patient was on 20 L of high flow oxygen at some point. -Patient completed 5/5 days of remdesivir on 03/31/2019.  He completed 10 days of 50 mg p.o. prednisone and then was started on tapering dose of prednisone which is what he is going to be discharged at.  He is currently on 2 L of oxygen.  Medication adjusted as below.   Essential hypertension: -Blood pressure slightly elevated.  Continue amlodipine but increase hydralazine to 50 mg 3 times daily.  Beta-blockers on hold.   -  Goal blood pressure should be less than 130/80 mmHg.   ESRD on HD: -Patient is on hemodialysis Monday, Wednesday and Friday.   Discharge Diagnoses:  Active Problems:   Acute respiratory failure with hypoxia (HCC)   History of thoracentesis   S/P thoracentesis   Shortness of breath   COVID-19   Hypoxia   End-stage renal disease on hemodialysis ([place])    Hypervolemia    Discharge Instructions   Allergies as of 04/10/2021       Reactions   Morphine And Related Itching, Other (See Comments)   Pt prefers not to be given this drug        Medication List     STOP taking these medications    potassium chloride SA 20 MEQ tablet Commonly known as: KLOR-CON       TAKE these medications    albuterol 108 (90 Base) MCG/ACT inhaler Commonly known as: VENTOLIN HFA Inhale 2 puffs into the lungs every 4 (four) hours as needed for shortness of breath.   amLODipine 10 MG tablet Commonly known as: NORVASC Take 1 tablet (10 mg total) by mouth daily.   aspirin EC 81 MG tablet Take 1 tablet (81 mg total) by mouth daily.   atorvastatin 40 MG tablet Commonly known as: Lipitor Take 1 tablet (40 mg total) by mouth daily.   brimonidine 0.1 % Soln Commonly known as: ALPHAGAN P Place 1 drop into the left eye in the morning and at bedtime.   calcium acetate 667 MG capsule Commonly known as: PHOSLO Take 1 capsule (667 mg total) by mouth 3 (three) times daily with meals.   diphenhydrAMINE 25 MG tablet Commonly known as: BENADRYL Take 25 mg by mouth every 8 (eight) hours as needed for itching.   dorzolamide-timolol 22.3-6.8 MG/ML ophthalmic solution Commonly known as: COSOPT Place 1 drop into the left eye 2 (two) times daily.   feeding supplement (PRO-STAT SUGAR FREE 64) Liqd Take 30 mLs by mouth 2 (two) times daily.   ferrous sulfate 325 (65 FE) MG tablet Take 325 mg by mouth daily.   guaiFENesin 100 MG/5ML liquid Commonly known as: ROBITUSSIN Take 100 mg by mouth every 4 (four) hours as needed for cough.   hydrALAZINE 50 MG tablet Commonly known as: APRESOLINE Take 50 mg by mouth 3 (three) times daily.   ibuprofen 800 MG tablet Commonly known as: ADVIL Take 800 mg by mouth every 12 (twelve) hours as needed for mild pain.   latanoprost 0.005 % ophthalmic solution Commonly known as: XALATAN Place 1 drop into the left  eye at bedtime.   losartan 100 MG tablet Commonly known as: COZAAR Take 1 tablet (100 mg total) by mouth daily.   Melatonin 10 MG Caps Take 10 mg by mouth at bedtime.   Menthol (Topical Analgesic) 5 % Gel Apply 1 application topically every 12 (twelve) hours as needed (bilateral shoulder pain).   olopatadine 0.1 % ophthalmic solution Commonly known as: PATANOL Place 2 drops into both eyes daily.   ondansetron 4 MG tablet Commonly known as: ZOFRAN Take 4 mg by mouth every 6 (six) hours as needed for nausea or vomiting.   oxyCODONE-acetaminophen 5-325 MG tablet Commonly known as: PERCOCET/ROXICET Take 1 tablet by mouth See admin instructions. 1 tablet once daily every Tuesday, Thursday, Saturday, and Sunday. May take an additional 1 tablet every 6 hours as needed on Monday's Wednesday's, and Friday's.   OXYGEN Inhale 3 L into the lungs continuous.   polyethylene glycol 17 g packet  Commonly known as: MIRALAX / GLYCOLAX Take 17 g by mouth daily.   predniSONE 20 MG tablet Commonly known as: DELTASONE Take 1 tablet (20 mg total) by mouth daily with breakfast for 2 days. Start taking on: April 11, 2021   predniSONE 10 MG tablet Commonly known as: DELTASONE Take 1 tablet (10 mg total) by mouth daily with breakfast for 2 days. Start taking on: April 13, 2021   pregabalin 25 MG capsule Commonly known as: LYRICA Take 25 mg by mouth 2 (two) times daily.   senna 8.6 MG Tabs tablet Commonly known as: SENOKOT Take 2 tablets by mouth at bedtime.   sertraline 25 MG tablet Commonly known as: ZOLOFT Take 25 mg by mouth daily. On Monday's, Wednesday's, and Friday's, take after dialysis. All other days take in the morning.   Tears Pure 0.1-0.3 % Soln Generic drug: Dextran 70-Hypromellose Place 1 drop into both eyes in the morning, at noon, in the evening, and at bedtime.   torsemide 20 MG tablet Commonly known as: DEMADEX Take 80 mg by mouth 2 (two) times daily.   Vitamin  D (Ergocalciferol) 1.25 MG (50000 UNIT) Caps capsule Commonly known as: DRISDOL Take 50,000 Units by mouth every 7 (seven) days. Sunday's        Follow-up Information     [name], MD Follow up in 1 week(s).   Specialty: Family Medicine Contact information: [place] [address] [phone]                Allergies  Allergen Reactions   Morphine And Related Itching and Other (See Comments)    Pt prefers not to be given this drug    Consultations: Nephrology and PCCM   Procedures/Studies: CT Abdomen Pelvis Wo Contrast  Result Date: 03/16/2021 CLINICAL DATA:  Abdominal pain since last night EXAM: CT ABDOMEN AND PELVIS WITHOUT CONTRAST TECHNIQUE: Multidetector CT imaging of the abdomen and pelvis was performed following the standard protocol without IV contrast. COMPARISON:  02/27/2020 FINDINGS: Lower chest: Moderate bilateral pleural effusions associated atelectasis or consolidation, improved compared to prior examination. Mild cardiomegaly. Hepatobiliary: No solid liver abnormality is seen. Small gallstones and/or sludge in the dependent gallbladder (series 3, image 39). No gallbladder wall thickening, or biliary dilatation. Pancreas: Unremarkable. No pancreatic ductal dilatation or surrounding inflammatory changes. Spleen: Normal in size without significant abnormality. Adrenals/Urinary Tract: Adrenal glands are unremarkable. Kidneys are normal, without renal calculi, solid lesion, or hydronephrosis. Bladder is unremarkable. Stomach/Bowel: Stomach is within normal limits. Appendix is not clearly visualized. No evidence of bowel wall thickening, distention, or inflammatory changes. Large burden of stool and stool balls throughout the colon. Vascular/Lymphatic: Aortic atherosclerosis. No enlarged abdominal or pelvic lymph nodes. Reproductive: No mass or other significant abnormality. Other: No abdominal wall hernia or abnormality. No abdominopelvic ascites.  Musculoskeletal: No acute or significant osseous findings. IMPRESSION: 1. No definite noncontrast CT findings of the abdomen or pelvis to explain abdominal pain. 2. Large burden of stool and stool balls throughout the colon. 3. Moderate bilateral pleural effusions and associated atelectasis or consolidation, improved compared to prior examination. 4. Mild cardiomegaly. Aortic Atherosclerosis (ICD10-I70.0). Electronically Signed   By: Delanna Ahmadi M.D.   On: 03/16/2021 14:14   CT Angio Chest Pulmonary Embolism (PE) W or WO Contrast  Result Date: 03/30/2021 CLINICAL DATA:  PE suspected, COVID positive, cough, congestion EXAM: CT ANGIOGRAPHY CHEST WITH CONTRAST TECHNIQUE: Multidetector CT imaging of the chest was performed using the standard protocol during bolus administration of  intravenous contrast. Multiplanar CT image reconstructions and MIPs were obtained to evaluate the vascular anatomy. CONTRAST:  25mL OMNIPAQUE IOHEXOL 350 MG/ML SOLN COMPARISON:  None. FINDINGS: Cardiovascular: Satisfactory opacification of the pulmonary arteries to the segmental level. No evidence of pulmonary embolism. Normal heart size. Left coronary artery calcifications. No pericardial effusion. Scattered aortic atherosclerosis. Mediastinum/Nodes: No enlarged mediastinal, hilar, or axillary lymph nodes. Thyroid gland, trachea, and esophagus demonstrate no significant findings. Lungs/Pleura: Moderate bilateral pleural effusions and associated atelectasis or consolidation. Extensive ground-glass airspace opacity throughout the aerated portions of the lungs. Upper Abdomen: No acute abnormality. Musculoskeletal: No chest wall abnormality. No acute or significant osseous findings. Review of the MIP images confirms the above findings. IMPRESSION: 1. Negative examination for pulmonary embolism. 2. Moderate bilateral pleural effusions and associated atelectasis or consolidation. 3. Extensive ground-glass airspace opacity throughout the  aerated portions of the lungs, consistent with multifocal infection and reported COVID-19 airspace disease. 4. Coronary artery disease. Aortic Atherosclerosis (ICD10-I70.0). Electronically Signed   By: Delanna Ahmadi M.D.   On: 03/30/2021 10:01   DG Chest Port 1 View  Result Date: 04/03/2021 CLINICAL DATA:  Hypoxia EXAM: PORTABLE CHEST 1 VIEW COMPARISON:  October 11th a fourteenth 2022 FINDINGS: The cardiomediastinal silhouette is unchanged in contour.Clips project over the RIGHT axilla. Small to moderate bilateral pleural effusions, similar in comparison to prior no pneumothorax. Improvement in aeration of the RIGHT lung in comparison to March 27, 2021 with some persistent hazy opacities diffusely. Persistent hazy basilar predominant airspace opacities of the LEFT lung. Visualized abdomen is unremarkable. IMPRESSION: 1. Persistent but mildly improved appearance of bilateral LEFT greater than RIGHT hazy opacities consistent with the sequela of COVID-19 infection. 2. Small moderate bilateral pleural effusions. Electronically Signed   By: Valentino Saxon M.D.   On: 04/03/2021 13:37   Portable chest 1 View  Result Date: 03/27/2021 CLINICAL DATA:  Shortness of breath EXAM: PORTABLE CHEST 1 VIEW COMPARISON:  Chest radiograph dated 1 day prior FINDINGS: The heart is enlarged, unchanged. The mediastinal contours are stable. There are left larger than right pleural effusions, not significantly changed in size. Aeration of the left base has slightly worsened in the interim. Patchy interstitial opacities throughout both lungs are otherwise similar to the prior study. There is no pneumothorax. The bones are stable. IMPRESSION: No significant interval change in size of bilateral pleural effusions with worsened aeration of the left base. Otherwise overall unchanged patchy interstitial opacities throughout both lungs favoring pulmonary interstitial edema. Electronically Signed   By: Valetta Mole M.D.   On:  03/27/2021 07:55   DG CHEST PORT 1 VIEW  Result Date: 03/26/2021 CLINICAL DATA:  Increasing shortness of breath and hypoxia. History of end-stage renal disease on hemodialysis. Diastat heart failure EXAM: PORTABLE CHEST 1 VIEW COMPARISON:  Radiographs 03/26/2021 and 03/16/2021.  CT 07/27/2020. FINDINGS: 1143 hours. The heart size and mediastinal contours are stable. There are bilateral pleural effusions, with interval enlargement on the right and slight improvement on the left. Diffuse bilateral perihilar airspace opacities are again noted, similar to multiple prior studies and likely reflecting chronic/recurrent edema. No pneumothorax. The bones appear unchanged. Multiple telemetry leads overlie the chest. IMPRESSION: Fluctuating bilateral pleural effusions, with interval improvement on the left and worsening on the right. Chronic bilateral airspace opacities, similar to previous study, likely recurrent edema. Electronically Signed   By: Richardean Sale M.D.   On: 03/26/2021 12:29   DG Chest Portable 1 View  Result Date: 03/26/2021 CLINICAL DATA:  50 year old male dialysis patient  with shortness of breath, respiratory distress. Positive COVID-19 2 days ago. EXAM: PORTABLE CHEST 1 VIEW COMPARISON:  Portable chest 03/16/2021 and earlier. FINDINGS: Chronic pleural effusions, currently moderate on the left, small on the right, and stable from last month. Associated lower lobe collapse or consolidation with increased air bronchograms from last month. Indistinct pulmonary vasculature bilaterally but new more confluent right greater than left perihilar opacity. No pneumothorax. Stable cardiac size and mediastinal contours. Visualized tracheal air column is within normal limits. No acute osseous abnormality identified. Chronic right axillary surgical clips. Paucity of bowel gas in the upper abdomen. IMPRESSION: Frequent pulmonary edema and chronic bilateral pleural effusions in this patient - not significantly  changed from last month - but new more confluent right greater than left perihilar opacity is suspicious for superimposed Acute Pneumonia in this setting. And bilateral lower lobe consolidation may also be progressed. Electronically Signed   By: Genevie Ann M.D.   On: 03/26/2021 06:16   DG Chest Port 1 View  Result Date: 03/16/2021 CLINICAL DATA:  Shortness of breath. EXAM: PORTABLE CHEST 1 VIEW COMPARISON:  Chest x-ray 08/11/2020 FINDINGS: The heart is borderline enlarged given the AP projection and portable technique. There is central vascular congestion, perihilar pulmonary edema, bilateral pleural effusions, left much larger than right and overlying atelectasis. IMPRESSION: CHF with bilateral pleural effusions and overlying atelectasis. Electronically Signed   By: Marijo Sanes M.D.   On: 03/16/2021 13:20   ECHOCARDIOGRAM COMPLETE  Result Date: 03/27/2021    ECHOCARDIOGRAM REPORT   Patient Name:   MUSTAFA POTTS Date of Exam: 03/27/2021 Medical Rec #:  [id number]    Height:       63.0 in Accession #:    [id number]   Weight:       118.4 lb Date of Birth:  [birth year]    BSA:          1.547 m Patient Age:    89 years     BP:           166/101 mmHg Patient Gender: M            HR:           84 bpm. Exam Location:  Inpatient Procedure: 2D Echo, Cardiac Doppler and Color Doppler Indications:    CHF  History:        Patient has prior history of Echocardiogram examinations, most                 recent 02/13/2019. CHF, Signs/Symptoms:Shortness of Breath; Risk                 Factors:Hypertension, Diabetes and Dyslipidemia. Covid 19                 positive.  Sonographer:    Merrie Roof RDCS Referring Phys: [id number] [place]  1. Left ventricular ejection fraction, by estimation, is 55 to 60%. The left ventricle has normal function. The left ventricle has no regional wall motion abnormalities. Left ventricular diastolic parameters are consistent with Grade II diastolic dysfunction (pseudonormalization).   2. Right ventricular systolic function is normal. The right ventricular size is moderately enlarged. There is moderately elevated pulmonary artery systolic pressure.  3. Left atrial size was mildly dilated.  4. Right atrial size was mildly dilated.  5. Moderate pleural effusion in the left lateral region.  6. The mitral valve is normal in structure. No evidence of mitral valve regurgitation. No evidence of mitral stenosis.  7. The  aortic valve is tricuspid. Aortic valve regurgitation is not visualized.  8. The inferior vena cava is normal in size with <50% respiratory variability, suggesting right atrial pressure of 8 mmHg. Comparison(s): A prior study was performed on 02/13/2019. Prior images reviewed side by side. RV size is larger. Pleural effusion is new. FINDINGS  Left Ventricle: Left ventricular ejection fraction, by estimation, is 55 to 60%. The left ventricle has normal function. The left ventricle has no regional wall motion abnormalities. The left ventricular internal cavity size was normal in size. There is  no left ventricular hypertrophy. Left ventricular diastolic parameters are consistent with Grade II diastolic dysfunction (pseudonormalization). Right Ventricle: The right ventricular size is moderately enlarged. No increase in right ventricular wall thickness. Right ventricular systolic function is normal. There is moderately elevated pulmonary artery systolic pressure. The tricuspid regurgitant  velocity is 3.30 m/s, and with an assumed right atrial pressure of 8 mmHg, the estimated right ventricular systolic pressure is 40.9 mmHg. Left Atrium: Left atrial size was mildly dilated. Right Atrium: Right atrial size was mildly dilated. Pericardium: There is no evidence of pericardial effusion. Mitral Valve: The mitral valve is normal in structure. No evidence of mitral valve regurgitation. No evidence of mitral valve stenosis. Tricuspid Valve: The tricuspid valve is normal in structure. Tricuspid valve  regurgitation is mild. Aortic Valve: The aortic valve is tricuspid. Aortic valve regurgitation is not visualized. Pulmonic Valve: The pulmonic valve was normal in structure. Pulmonic valve regurgitation is trivial. No evidence of pulmonic stenosis. Aorta: The aortic root is normal in size and structure. Venous: The inferior vena cava is normal in size with less than 50% respiratory variability, suggesting right atrial pressure of 8 mmHg. IAS/Shunts: The atrial septum is grossly normal. Additional Comments: There is a moderate pleural effusion in the left lateral region.  LEFT VENTRICLE PLAX 2D LVIDd:         4.90 cm   Diastology LVIDs:         3.60 cm   LV e' medial:    5.11 cm/s LV PW:         1.10 cm   LV E/e' medial:  22.1 LV IVS:        0.80 cm   LV e' lateral:   8.81 cm/s LVOT diam:     1.90 cm   LV E/e' lateral: 12.8 LV SV:         50 LV SV Index:   32 LVOT Area:     2.84 cm  RIGHT VENTRICLE RV Basal diam:  4.50 cm RV Mid diam:    4.70 cm LEFT ATRIUM             Index        RIGHT ATRIUM           Index LA diam:        3.80 cm 2.46 cm/m   RA Area:     16.90 cm LA Vol (A2C):   61.8 ml 39.94 ml/m  RA Volume:   54.90 ml  35.48 ml/m LA Vol (A4C):   49.6 ml 32.05 ml/m LA Biplane Vol: 57.3 ml 37.03 ml/m  AORTIC VALVE LVOT Vmax:   79.00 cm/s LVOT Vmean:  55.400 cm/s LVOT VTI:    0.177 m  AORTA Ao Root diam: 2.60 cm MITRAL VALVE                TRICUSPID VALVE MV Area (PHT): 4.04 cm     TR Peak grad:  43.6 mmHg MV Decel Time: 188 msec     TR Vmax:        330.00 cm/s MV E velocity: 113.00 cm/s MV A velocity: 84.40 cm/s   SHUNTS MV E/A ratio:  1.34         Systemic VTI:  0.18 m                             Systemic Diam: 1.90 cm Rudean Haskell MD Electronically signed by Rudean Haskell MD Signature Date/Time: 03/27/2021/3:33:18 PM    Final      Discharge Exam: Vitals:   04/10/21 0505 04/10/21 0726  BP: (!) 153/79 (!) 158/86  Pulse: 90 93  Resp: 19 16  Temp: 98.8 F (37.1 C) 98.6 F (37 C)   SpO2: 97% 95%   Vitals:   04/09/21 2141 04/10/21 0039 04/10/21 0505 04/10/21 0726  BP: (!) 167/92 (!) 160/82 (!) 153/79 (!) 158/86  Pulse: 86 95 90 93  Resp: 10 11 19 16   Temp: 98.6 F (37 C) 98.8 F (37.1 C) 98.8 F (37.1 C) 98.6 F (37 C)  TempSrc: Oral Oral Oral Oral  SpO2: 99% 98% 97% 95%  Weight:   57.9 kg     General: Pt is alert, awake, not in acute distress Cardiovascular: RRR, S1/S2 +, no rubs, no gallops Respiratory: Diminished breath sounds bilaterally, no wheezing, no rhonchi Abdominal: Soft, NT, ND, bowel sounds + Extremities: no edema, no cyanosis    The results of significant diagnostics from this hospitalization (including imaging, microbiology, ancillary and laboratory) are listed below for reference.     Microbiology: No results found for this or any previous visit (from the past 240 hour(s)).   Labs: BNP (last 3 results) Recent Labs    03/16/21 1245  BNP 885.0*   Basic Metabolic Panel: Recent Labs  Lab 04/05/21 0132 04/06/21 2133 04/09/21 0127  NA 130* 133* 128*  K 3.8 3.8 4.2  CL 94* 97* 95*  CO2 23 27 22   GLUCOSE 263* 144* 337*  BUN 57* 19 69*  CREATININE 4.59* 2.06* 4.66*  CALCIUM 8.7* 8.6* 8.4*  PHOS 3.2 1.5* 2.2*   Liver Function Tests: Recent Labs  Lab 04/05/21 0132 04/06/21 2133 04/09/21 0127  ALBUMIN 2.9* 3.2* 3.0*   No results for input(s): LIPASE, AMYLASE in the last 168 hours. No results for input(s): AMMONIA in the last 168 hours. CBC: Recent Labs  Lab 04/05/21 0317 04/06/21 2133 04/09/21 0127  WBC 17.9* 22.0* 29.5*  NEUTROABS  --   --  26.6*  HGB 9.3* 9.7* 8.2*  HCT 28.4* 29.1* 25.7*  MCV 86.1 83.6 87.7  PLT 220 245 215   Cardiac Enzymes: No results for input(s): CKTOTAL, CKMB, CKMBINDEX, TROPONINI in the last 168 hours. BNP: Invalid input(s): POCBNP CBG: Recent Labs  Lab 04/09/21 1213 04/09/21 2139 04/10/21 0036 04/10/21 0501 04/10/21 0728  GLUCAP 124* 143* 304* 195* 67*   D-Dimer No results  for input(s): DDIMER in the last 72 hours. Hgb A1c No results for input(s): HGBA1C in the last 72 hours. Lipid Profile No results for input(s): CHOL, HDL, LDLCALC, TRIG, CHOLHDL, LDLDIRECT in the last 72 hours. Thyroid function studies No results for input(s): TSH, T4TOTAL, T3FREE, THYROIDAB in the last 72 hours.  Invalid input(s): FREET3 Anemia work up No results for input(s): VITAMINB12, FOLATE, FERRITIN, TIBC, IRON, RETICCTPCT in the last 72 hours. Urinalysis    Component Value Date/Time   COLORURINE AMBER (A)  10/27/2019 1559   APPEARANCEUR TURBID (A) 10/27/2019 1559   LABSPEC 1.014 10/27/2019 1559   PHURINE 5.0 10/27/2019 1559   GLUCOSEU 50 (A) 10/27/2019 1559   HGBUR LARGE (A) 10/27/2019 1559   BILIRUBINUR NEGATIVE 10/27/2019 1559   KETONESUR NEGATIVE 10/27/2019 1559   PROTEINUR 100 (A) 10/27/2019 1559   UROBILINOGEN 0.2 12/13/2014 2056   NITRITE NEGATIVE 10/27/2019 1559   LEUKOCYTESUR LARGE (A) 10/27/2019 1559   Sepsis Labs Invalid input(s): PROCALCITONIN,  WBC,  LACTICIDVEN Microbiology No results found for this or any previous visit (from the past 240 hour(s)).   Time coordinating discharge: Over 30 minutes  SIGNED:   Darliss Cheney, MD  [facility] 04/10/2021, 8:14 AM  If 7PM-7AM, please contact night-coverage [email or web address]

## 2021-04-10 NOTE — Progress Notes (Signed)
Attempted to call report to [facility] @ [phone]. Spoke with Network engineer, when transferred to RN, unable to get in contact. Will try again to make contact in 30 minutes.

## 2021-04-10 NOTE — Care Management Important Message (Signed)
Important Message  Patient Details  Name: Christopher Davila MRN: [id number] Date of Birth: 1970/08/02   Medicare Important Message Given:  Yes     Kimmy Totten 04/10/2021, 4:05 PM

## 2021-04-10 NOTE — Progress Notes (Signed)
[facility] NEPHROLOGY PROGRESS NOTE  Assessment/ Plan:  Dialysis Orders: Outpatient orders: [facility], MWF, 4 hrs, F180, 2k, 2cal, edw 53kg, no heparin  #Acute on chronic hypoxic respiratory failure/COVID-19 pneumonia/acute pulmonary edema: He completed 5 days of remdesivir on 10/14 and now on tapering dose of prednisone.  Respiratory status has improved and now only on supplemental oxygen via nasal cannula.  Volume management with dialysis.   # ESRD, MWF schedule: Intermittent refusal of dialysis.  No bleeding from AV fistula site.  Status post HD yesterday with 2.7 L ultrafiltration, tolerated well.  Plan for next HD tomorrow. Noted he is being discharged today, so he can resume HD outpatient.  # Anemia of CKD: Continue ESA.  Monitor hemoglobin.  # Secondary hyperparathyroidism: Off of binders because of low phosphorus.  Monitor calcium, phosphorus level.  # HTN/volume: Now under EDW.  UF as tolerated.  He is on antihypertensives.  Subjective: Seen and examined.  Denies chest pain, shortness of breath, nausea or vomiting.  No new event. Objective Vital signs in last 24 hours: Vitals:   04/09/21 2141 04/10/21 0039 04/10/21 0505 04/10/21 0726  BP: (!) 167/92 (!) 160/82 (!) 153/79 (!) 158/86  Pulse: 86 95 90 93  Resp: 10 11 19 16   Temp: 98.6 F (37 C) 98.8 F (37.1 C) 98.8 F (37.1 C) 98.6 F (37 C)  TempSrc: Oral Oral Oral Oral  SpO2: 99% 98% 97% 95%  Weight:   57.9 kg    Weight change: 1.5 kg  Intake/Output Summary (Last 24 hours) at 04/10/2021 1026 Last data filed at 04/10/2021 2694 Gross per 24 hour  Intake --  Output 2914 ml  Net -2914 ml        Labs: Basic Metabolic Panel: Recent Labs  Lab 04/05/21 0132 04/06/21 2133 04/09/21 0127  NA 130* 133* 128*  K 3.8 3.8 4.2  CL 94* 97* 95*  CO2 23 27 22   GLUCOSE 263* 144* 337*  BUN 57* 19 69*  CREATININE 4.59* 2.06* 4.66*  CALCIUM 8.7* 8.6* 8.4*  PHOS 3.2 1.5* 2.2*    Liver Function  Tests: Recent Labs  Lab 04/05/21 0132 04/06/21 2133 04/09/21 0127  ALBUMIN 2.9* 3.2* 3.0*    No results for input(s): LIPASE, AMYLASE in the last 168 hours. No results for input(s): AMMONIA in the last 168 hours. CBC: Recent Labs  Lab 04/05/21 0317 04/06/21 2133 04/09/21 0127  WBC 17.9* 22.0* 29.5*  NEUTROABS  --   --  26.6*  HGB 9.3* 9.7* 8.2*  HCT 28.4* 29.1* 25.7*  MCV 86.1 83.6 87.7  PLT 220 245 215    Cardiac Enzymes: No results for input(s): CKTOTAL, CKMB, CKMBINDEX, TROPONINI in the last 168 hours. CBG: Recent Labs  Lab 04/09/21 2139 04/10/21 0036 04/10/21 0501 04/10/21 0728 04/10/21 0903  GLUCAP 143* 304* 195* 67* 118*     Iron Studies: No results for input(s): IRON, TIBC, TRANSFERRIN, FERRITIN in the last 72 hours. Studies/Results: No results found.  Medications: Infusions:    Scheduled Medications:  acetaminophen  1,000 mg Oral Once   amLODipine  10 mg Oral Daily   aspirin  81 mg Oral Daily   atorvastatin  40 mg Oral Daily   brimonidine  1 drop Left Eye BID   Chlorhexidine Gluconate Cloth  6 each Topical Q0600   darbepoetin (ARANESP) injection - DIALYSIS  60 mcg Intravenous Q Fri-HD   docusate sodium  100 mg Oral BID   dorzolamide-timolol  1 drop Left Eye BID  heparin injection (subcutaneous)  5,000 Units Subcutaneous Q8H   hydrALAZINE  50 mg Oral Q8H   insulin aspart  0-6 Units Subcutaneous Q4H   insulin glargine-yfgn  15 Units Subcutaneous BID   latanoprost  1 drop Left Eye QHS   mupirocin ointment   Nasal BID   olopatadine  2 drop Both Eyes BID   polyethylene glycol  17 g Oral Daily   polyvinyl alcohol  1 drop Both Eyes TID   [START ON 04/11/2021] predniSONE  20 mg Oral Q breakfast   Followed by   Derrill Memo ON 04/13/2021] predniSONE  10 mg Oral Q breakfast   pregabalin  25 mg Oral BID   senna  1 tablet Oral QHS   sertraline  25 mg Oral Daily    have reviewed scheduled and prn medications.  Physical Exam: General: Sitting on bed  comfortable, not in distress Heart:RRR, s1s2 nl Lungs: Clear b/l, no crackle Abdomen:soft, Non-tender, non-distended Extremities:No edema Dialysis Access: Right upper extremity AV fistula has good thrill and bruit, no sign of active bleed.  Christopher Davila 04/10/2021,10:26 AM  LOS: 15 days

## 2021-04-10 NOTE — TOC Transition Note (Signed)
Transition of Care [facility]) - CM/SW Discharge Note   Patient Details  Name: Christopher Davila MRN: [id number] Date of Birth: May 17, 1971  [facility] CM/SW Contact:  Benard Halsted, LCSW Phone Number: 04/10/2021, 11:28 AM   Clinical Narrative:    Patient will DC to: [name] Anticipated DC date: 04/10/21 Family notified: [facility] Transport by: Corey Harold   Per MD patient ready for DC to [place]. RN to call report prior to discharge [phone]). RN, patient, patient's family, and facility notified of DC. Discharge Summary and FL2 sent to facility. DC packet on chart. Ambulance transport requested for patient.   CSW will sign off for now as social work intervention is no longer needed. Please consult [place] again if new needs arise.     Final next level of care: Skilled Nursing Facility Barriers to Discharge: Barriers Resolved   Patient Goals and CMS Choice Patient states their goals for this hospitalization and ongoing recovery are:: Return to snf CMS Medicare.gov Compare Post Acute Care list provided to:: Patient Choice offered to / list presented to : Patient, [facility] POA / [place]  Discharge Placement   Existing PASRR number confirmed : 04/10/21          Patient chooses bed at: [facility] Patient to be transferred to facility by: [place] Name of family member notified: [facility] Patient and family notified of of transfer: 04/10/21  Discharge Plan and Services In-house Referral: Clinical Social Work   Post Acute Care Choice: [facility]                               Social Determinants of Health (SDOH) Interventions     Readmission Risk Interventions Readmission Risk Prevention Plan 04/06/2021 02/19/2019  Transportation Screening Complete Complete  PCP or Specialist Appt within 5-7 Days - Complete  Home Care Screening - Complete  Medication Review (RN CM) - Complete  Medication Review Press photographer) Complete -  PCP or Specialist  appointment within 3-5 days of discharge Complete -  [place] or Home Care Consult Complete -  SW Recovery Care/Counseling Consult Complete -  Palliative Care Screening Not Applicable -  Skilled Nursing Facility Complete -  Some recent data might be hidden

## 2021-04-10 NOTE — Progress Notes (Signed)
Spoke to [facility] at [facility] to advise clinic of pt's d/c today and to resume care tomorrow. Clinic agreeable to plan.  Melven Sartorius Renal Navigator [phone]

## 2021-04-24 ENCOUNTER — Telehealth: Payer: Self-pay | Admitting: Cardiology

## 2021-04-24 NOTE — Telephone Encounter (Signed)
Shontae from [facility] states the patient has worsening CHF and would like to know if the patient can be seen sooner than his appointment 06/14/2021. This was the first Tuesday or Thursday I saw available, since the patient has dialysis on other days. Phone: [phone]

## 2021-04-24 NOTE — Telephone Encounter (Signed)
Spoke to [employer] from [facility]. She state since Sunday pt has been more SOB and has required an increase in O2 to 3 L continuously. She denies any significant changes in weight but report pt has chronic lower extremity edema. She state he wears compression stockings daily but it doesn't help and swelling in worse on days he doesn't have dialysis. She also report pt is compliant with taking 80 mg torsemide BID.  Facility is requesting a sooner appointment. Appointment scheduled for 11/10 with Laurann Montana, NP for further evaluations. Facility will fax over recent chest xray and labs for review. Will also forward to MD for recommendations in the meantime.

## 2021-04-24 NOTE — Telephone Encounter (Signed)
Noted. Agree with sooner clinic visit. Volume management will need to be managed by dialysis.   Loel Dubonnet, NP

## 2021-04-26 ENCOUNTER — Encounter (HOSPITAL_BASED_OUTPATIENT_CLINIC_OR_DEPARTMENT_OTHER): Payer: Self-pay | Admitting: Family

## 2021-04-26 ENCOUNTER — Ambulatory Visit: Payer: Medicare Other | Admitting: Podiatry

## 2021-04-26 ENCOUNTER — Other Ambulatory Visit: Payer: Self-pay

## 2021-04-26 ENCOUNTER — Ambulatory Visit (INDEPENDENT_AMBULATORY_CARE_PROVIDER_SITE_OTHER): Payer: Medicare Other | Admitting: Family

## 2021-04-26 VITALS — BP 156/80 | HR 102 | Ht 63.0 in | Wt 130.0 lb

## 2021-04-26 DIAGNOSIS — I5032 Chronic diastolic (congestive) heart failure: Secondary | ICD-10-CM | POA: Diagnosis not present

## 2021-04-26 DIAGNOSIS — I10 Essential (primary) hypertension: Secondary | ICD-10-CM | POA: Diagnosis not present

## 2021-04-26 DIAGNOSIS — I2510 Atherosclerotic heart disease of native coronary artery without angina pectoris: Secondary | ICD-10-CM | POA: Diagnosis not present

## 2021-04-26 DIAGNOSIS — N186 End stage renal disease: Secondary | ICD-10-CM

## 2021-04-26 DIAGNOSIS — E785 Hyperlipidemia, unspecified: Secondary | ICD-10-CM

## 2021-04-26 MED ORDER — HYDRALAZINE HCL 50 MG PO TABS: 75.0000 mg | ORAL_TABLET | Freq: Three times a day (TID) | ORAL | 11 refills | Status: DC

## 2021-04-26 NOTE — Progress Notes (Signed)
Office Visit    Patient Name: Christopher Davila Date of Encounter: 04/26/2021  PCP:  [name], MD   [place]  Cardiologist:  Peter Martinique, MD  Advanced Practice Provider:  No care team member to display Electrophysiologist:  None   Chief Complaint    [name] is a 50 y.o. male with a hx of ESRD on HD, HTn, anemia, left eye blindness, DM2 with gastroparesis, diastolic heart failure, pancreatitis, COVID 19 presents today for hospital follow up   Past Medical History    Past Medical History:  Diagnosis Date   Anemia    Blindness 09/04/2019   CHF (congestive heart failure) ([place])    Diabetes mellitus    Diarrhea 10/14/2019   Epigastric abdominal pain    ESRD (end stage renal disease) ([place])    Gastroparesis due to DM ([place])    Hypertension    Hypoalbuminemia 04/17/2019   Hypoxia    Neuropathy of lower extremity    Oral candidiasis 06/26/2012   Pancreatitis 06/26/2012   Weight loss 06/26/2012   Past Surgical History:  Procedure Laterality Date   AV FISTULA PLACEMENT Right 10/29/2019   Procedure: RIGHT ARM BRACHIOBASILIC ARTERIOVENOUS (AV) FISTULA CREATION;  Surgeon: Rosetta Posner, MD;  Location: [facility] OR;  Service: Vascular;  Laterality: Right;   [place] Right 01/05/2020   Procedure: RIGHT ARM SECOND STAGE [place];  Surgeon: Rosetta Posner, MD;  Location: [facility] OR;  Service: Vascular;  Laterality: Right;   EYE SURGERY     IR FLUORO GUIDE CV LINE RIGHT  10/28/2019   IR US GUIDE VASC ACCESS RIGHT  10/28/2019    Allergies  Allergies  Allergen Reactions   Morphine And Related Itching and Other (See Comments)    Pt prefers not to be given this drug    History of Present Illness    [name] is a 50 y.o. male with a hx of ESRD on HD, HTn, anemia, left eye blindness, DM2 with gastroparesis, diastolic heart failure, pancreatitis, COVID 19 last seen 12/05/20 by Dr. Martinique. At the time he was seen in consult  of heart failure .  He was admitted 03/2021 with [place]. He required BIPAP an dup to 20L of supplemental oxygen. He completed 5 days of Remdesevir and 10 days of Prednisone. Hydralazine was increased due to hypertension.   He presents today for follow up. Tells me his breathing is much improved since hospital discharge. Enjoys riding stationary bike and walking at [place] for exercise. Reports no shortness of breath nor dyspnea on exertion. Reports no chest pain, pressure, or tightness. No edema, orthopnea, PND. Reports no palpitations.  Reports tolerating HD and BP at HD routinely in the 150s. No lightheadedness, dizziness.   EKGs/Labs/Other Studies Reviewed:   The following studies were reviewed today:  EKG:  No EKG today.   Recent Labs: 03/16/2021: B Natriuretic Peptide 188.6 03/31/2021: ALT 25; Magnesium 2.6 04/09/2021: BUN 69; Creatinine, Ser 4.66; Hemoglobin 8.2; Platelets 215; Potassium 4.2; Sodium 128  Recent Lipid Panel    Component Value Date/Time   CHOL 244 (H) 02/12/2019 2238   TRIG 182 (H) 02/12/2019 2238   HDL 72 02/12/2019 2238   CHOLHDL 3.4 02/12/2019 2238   VLDL 36 02/12/2019 2238   LDLCALC 136 (H) 02/12/2019 2238   Home Medications   Current Meds  Medication Sig   albuterol (VENTOLIN HFA) 108 (90 Base) MCG/ACT inhaler Inhale 2 puffs into the lungs every 4 (four) hours as  needed for shortness of breath.   amLODipine (NORVASC) 10 MG tablet Take 1 tablet (10 mg total) by mouth daily.   aspirin EC 81 MG tablet Take 1 tablet (81 mg total) by mouth daily.   atorvastatin (LIPITOR) 40 MG tablet Take 1 tablet (40 mg total) by mouth daily.   atropine 1 % ophthalmic solution Place 1 drop into both eyes in the morning and at bedtime.   brimonidine (ALPHAGAN P) 0.1 % SOLN Place 1 drop into the left eye in the morning and at bedtime.   calcium acetate (PHOSLO) 667 MG capsule Take 1 capsule (667 mg total) by mouth 3 (three) times daily with meals.   cefTRIAXone (ROCEPHIN) 1  g injection SMARTSIG:1 Gram(s) IM Daily PRN   Dextran 70-Hypromellose (TEARS PURE) 0.1-0.3 % SOLN Place 1 drop into both eyes in the morning, at noon, in the evening, and at bedtime.   diphenhydrAMINE (BENADRYL) 25 MG tablet Take 25 mg by mouth every 8 (eight) hours as needed for itching.   dorzolamide-timolol (COSOPT) 22.3-6.8 MG/ML ophthalmic solution Place 1 drop into the left eye 2 (two) times daily.   ferrous sulfate 325 (65 FE) MG tablet Take 325 mg by mouth daily.   guaiFENesin (ROBITUSSIN) 100 MG/5ML liquid Take 100 mg by mouth every 4 (four) hours as needed for cough.   hydrALAZINE (APRESOLINE) 50 MG tablet Take 50 mg by mouth 3 (three) times daily.   ibuprofen (ADVIL) 800 MG tablet Take 800 mg by mouth every 12 (twelve) hours as needed for mild pain.   latanoprost (XALATAN) 0.005 % ophthalmic solution Place 1 drop into the left eye at bedtime.   levofloxacin (LEVAQUIN) 750 MG tablet Take 750 mg by mouth daily. For 5 days   losartan (COZAAR) 100 MG tablet Take 1 tablet (100 mg total) by mouth daily.   Melatonin 10 MG CAPS Take 10 mg by mouth at bedtime.   olopatadine (PATANOL) 0.1 % ophthalmic solution Place 2 drops into both eyes daily.   ondansetron (ZOFRAN) 4 MG tablet Take 4 mg by mouth every 6 (six) hours as needed for nausea or vomiting.   oxyCODONE-acetaminophen (PERCOCET/ROXICET) 5-325 MG tablet Take 1 tablet by mouth See admin instructions. 1 tablet once daily every Tuesday, Thursday, Saturday, and Sunday. May take an additional 1 tablet every 6 hours as needed on Monday's Wednesday's, and Friday's.   OXYGEN Inhale 3 L into the lungs continuous.   polyethylene glycol (MIRALAX / GLYCOLAX) 17 g packet Take 17 g by mouth daily.   prednisoLONE acetate (PRED FORTE) 1 % ophthalmic suspension Place 1 drop into both eyes 4 (four) times daily.   pregabalin (LYRICA) 25 MG capsule Take 25 mg by mouth 2 (two) times daily.   senna (SENOKOT) 8.6 MG TABS tablet Take 2 tablets by mouth at  bedtime.   sertraline (ZOLOFT) 25 MG tablet Take 25 mg by mouth daily. On Monday's, Wednesday's, and Friday's, take after dialysis. All other days take in the morning.   torsemide (DEMADEX) 20 MG tablet Take 80 mg by mouth 2 (two) times daily.   Vitamin D, Ergocalciferol, (DRISDOL) 1.25 MG (50000 UNIT) CAPS capsule Take 50,000 Units by mouth every 7 (seven) days. Sunday's     Review of Systems     All other systems reviewed and are otherwise negative except as noted above.  Physical Exam    VS:  BP (!) 156/80   Pulse (!) 102   Ht 5\' 3"  (1.6 m)   Wt 130 lb (  59 kg)   BMI 23.03 kg/m  , BMI Body mass index is 23.03 kg/m.  Wt Readings from Last 3 Encounters:  04/26/21 130 lb (59 kg)  04/10/21 127 lb 10.3 oz (57.9 kg)  03/16/21 130 lb (59 kg)    GEN: Well nourished, well developed, in no acute distress. HEENT: normal. Neck: Supple, no JVD, carotid bruits, or masses. Cardiac: tachycardic, RRR, no murmurs, rubs, or gallops. No clubbing, cyanosis, edema.  Radials/PT 2+ and equal bilaterally.  Respiratory:  Respirations regular and unlabored, clear to auscultation bilaterally. GI: Soft, nontender, nondistended. MS: No deformity or atrophy. Skin: Warm and dry, no rash. Neuro:  Strength and sensation are intact. Psych: Normal affect.  Assessment & Plan    Chronic diastolic heart failure - Euvolemic and well compensated on exam. Volume managed by HD. No SGLT2i due to ESRD.   Coronary calcification on CT - Stable with no anginal symptoms. No indication for ischemic evaluation.  `  Hx of COVID 19 - Recent admission. Lung sounds clear on exam. Down to 3L O2.  ESRD on HD - Follows with nephrology. Does HD M/W/F. Recommended he discuss Torsemide with his nephrologist as given ESRD it is not of clinical benefit.   HTN - BP not at goal. Increase Hydralazine to 75mg  TID. Continue Amlodipine 10mg  QD and Losartan 100mg  QD.   HLD, LDL goal <70 - Continue Atorvastatin 40mg  QD. Lipid panel  collected today. If LDL not at goal, plan to increase to 80mg  QD.   Disposition: Follow up in 6 month(s) with Peter Martinique, MD or APP.  Signed, Loel Dubonnet, NP 04/26/2021, 8:33 AM [place]

## 2021-04-26 NOTE — Patient Instructions (Signed)
Medication Instructions:  Your physician has recommended you make the following change in your medication:   CHANGE Hydralazine to 75mg  three times per day  *If you need a refill on your cardiac medications before your next appointment, please call your pharmacy*   Lab Work: Your physician recommends that you return for lab work today: lipid panel  If you have labs (blood work) drawn today and your tests are completely normal, you will receive your results only by: [place] (if you have MyChart) OR A paper copy in the mail If you have any lab test that is abnormal or we need to change your treatment, we will call you to review the results.   Testing/Procedures: None ordered today.   Follow-Up: At [facility], you and your health needs are our priority.  As part of our continuing mission to provide you with exceptional heart care, we have created designated Provider Care Teams.  These Care Teams include your primary Cardiologist (physician) and Advanced Practice Providers (APPs -  Physician Assistants and Nurse Practitioners) who all work together to provide you with the care you need, when you need it.  We recommend signing up for the patient portal called "MyChart".  Sign up information is provided on this After Visit Summary.  MyChart is used to connect with patients for Virtual Visits (Telemedicine).  Patients are able to view lab/test results, encounter notes, upcoming appointments, etc.  Non-urgent messages can be sent to your provider as well.   To learn more about what you can do with MyChart, go to [email or web address].    Your next appointment:   6 month(s)  The format for your next appointment:   In Person  Provider:   Peter Martinique, MD

## 2021-04-27 ENCOUNTER — Telehealth: Payer: Self-pay | Admitting: Nurse Practitioner

## 2021-04-27 ENCOUNTER — Emergency Department (HOSPITAL_COMMUNITY): Payer: Medicare Other

## 2021-04-27 ENCOUNTER — Emergency Department (HOSPITAL_COMMUNITY)
Admission: EM | Admit: 2021-04-27 | Discharge: 2021-04-28 | Disposition: A | Discharge status: Skilled Nursing Facility | Payer: Medicare Other | Attending: Emergency Medicine | Admitting: Emergency Medicine

## 2021-04-27 ENCOUNTER — Encounter (HOSPITAL_COMMUNITY): Payer: Self-pay

## 2021-04-27 DIAGNOSIS — Z7982 Long term (current) use of aspirin: Secondary | ICD-10-CM | POA: Insufficient documentation

## 2021-04-27 DIAGNOSIS — R0602 Shortness of breath: Secondary | ICD-10-CM | POA: Diagnosis present

## 2021-04-27 DIAGNOSIS — Z8616 Personal history of COVID-19: Secondary | ICD-10-CM | POA: Insufficient documentation

## 2021-04-27 DIAGNOSIS — I5033 Acute on chronic diastolic (congestive) heart failure: Secondary | ICD-10-CM | POA: Diagnosis not present

## 2021-04-27 DIAGNOSIS — R600 Localized edema: Secondary | ICD-10-CM | POA: Insufficient documentation

## 2021-04-27 DIAGNOSIS — Z992 Dependence on renal dialysis: Secondary | ICD-10-CM | POA: Diagnosis not present

## 2021-04-27 DIAGNOSIS — N186 End stage renal disease: Secondary | ICD-10-CM | POA: Diagnosis not present

## 2021-04-27 DIAGNOSIS — I132 Hypertensive heart and chronic kidney disease with heart failure and with stage 5 chronic kidney disease, or end stage renal disease: Secondary | ICD-10-CM | POA: Diagnosis not present

## 2021-04-27 DIAGNOSIS — Z79899 Other long term (current) drug therapy: Secondary | ICD-10-CM | POA: Insufficient documentation

## 2021-04-27 DIAGNOSIS — Z87891 Personal history of nicotine dependence: Secondary | ICD-10-CM | POA: Insufficient documentation

## 2021-04-27 DIAGNOSIS — E1122 Type 2 diabetes mellitus with diabetic chronic kidney disease: Secondary | ICD-10-CM | POA: Diagnosis not present

## 2021-04-27 DIAGNOSIS — R0600 Dyspnea, unspecified: Secondary | ICD-10-CM

## 2021-04-27 LAB — LIPID PANEL
Chol/HDL Ratio: 1.8 ratio (ref 0.0–5.0)
Cholesterol, Total: 98 mg/dL — ABNORMAL LOW (ref 100–199)
HDL: 56 mg/dL (ref >39)
LDL Chol Calc (NIH): 29 mg/dL (ref 0–99)
Triglycerides: 55 mg/dL (ref 0–149)
VLDL Cholesterol Cal: 13 mg/dL (ref 5–40)

## 2021-04-27 LAB — CBC WITH DIFFERENTIAL/PLATELET
Abs Immature Granulocytes: 0.03 10*3/uL (ref 0.00–0.07)
Basophils Absolute: 0 10*3/uL (ref 0.0–0.1)
Basophils Relative: 0 %
Eosinophils Absolute: 0.1 10*3/uL (ref 0.0–0.5)
Eosinophils Relative: 2 %
HCT: 32.8 % — ABNORMAL LOW (ref 39.0–52.0)
Hemoglobin: 9.6 g/dL — ABNORMAL LOW (ref 13.0–17.0)
Immature Granulocytes: 1 %
Lymphocytes Relative: 8 %
Lymphs Abs: 0.5 10*3/uL — ABNORMAL LOW (ref 0.7–4.0)
MCH: 28.3 pg (ref 26.0–34.0)
MCHC: 29.3 g/dL — ABNORMAL LOW (ref 30.0–36.0)
MCV: 96.8 fL (ref 80.0–100.0)
Monocytes Absolute: 0.4 10*3/uL (ref 0.1–1.0)
Monocytes Relative: 6 %
Neutro Abs: 5 10*3/uL (ref 1.7–7.7)
Neutrophils Relative %: 83 %
Platelets: 151 10*3/uL (ref 150–400)
RBC: 3.39 MIL/uL — ABNORMAL LOW (ref 4.22–5.81)
RDW: 18.4 % — ABNORMAL HIGH (ref 11.5–15.5)
WBC: 5.9 10*3/uL (ref 4.0–10.5)
nRBC: 0 % (ref 0.0–0.2)

## 2021-04-27 LAB — COMPREHENSIVE METABOLIC PANEL
ALT: 13 U/L (ref 0–44)
AST: 25 U/L (ref 15–41)
Albumin: 3.1 g/dL — ABNORMAL LOW (ref 3.5–5.0)
Alkaline Phosphatase: 101 U/L (ref 38–126)
Anion gap: 11 (ref 5–15)
BUN: 12 mg/dL (ref 6–20)
CO2: 28 mmol/L (ref 22–32)
Calcium: 8.5 mg/dL — ABNORMAL LOW (ref 8.9–10.3)
Chloride: 96 mmol/L — ABNORMAL LOW (ref 98–111)
Creatinine, Ser: 3.07 mg/dL — ABNORMAL HIGH (ref 0.61–1.24)
GFR, Estimated: 24 mL/min — ABNORMAL LOW (ref >60)
Glucose, Bld: 103 mg/dL — ABNORMAL HIGH (ref 70–99)
Potassium: 3.9 mmol/L (ref 3.5–5.1)
Sodium: 135 mmol/L (ref 135–145)
Total Bilirubin: 0.9 mg/dL (ref 0.3–1.2)
Total Protein: 6.4 g/dL — ABNORMAL LOW (ref 6.5–8.1)

## 2021-04-27 MED ORDER — OXYCODONE-ACETAMINOPHEN 5-325 MG PO TABS
2.0000 | ORAL_TABLET | Freq: Once | ORAL | Status: AC
  Administered 2021-04-27: 2 via ORAL
  Filled 2021-04-27: qty 2

## 2021-04-27 MED ORDER — IPRATROPIUM-ALBUTEROL 0.5-2.5 (3) MG/3ML IN SOLN
3.0000 mL | Freq: Once | RESPIRATORY_TRACT | Status: AC
  Administered 2021-04-27: 3 mL via RESPIRATORY_TRACT
  Filled 2021-04-27: qty 3

## 2021-04-27 NOTE — ED Notes (Signed)
PTAR was called 5th on list

## 2021-04-27 NOTE — Discharge Instructions (Addendum)
Your testing reveals that you do have a small amount of fluid in each of your lungs, we have seen this on the x-rays going back several months and does not appear to be new.  I would like for you to stay on 4 L of oxygen however if you are doing good and you only need 3 you may turn it down to 3, turn it up to 4 if you need it.  Return to the emergency department for increasing chest pain fevers or worsening symptoms.  I would like for you to follow-up with the pulmonary clinic.  Please see their attached follow-up information, they will need to see you in the clinic to organize further testing to see where the fluid is coming from.

## 2021-04-27 NOTE — ED Triage Notes (Signed)
Pt was in dialysis when pt O2 dropped to 55% on RA. Non rebreather started. Now at 3LPM at 98%. 124mg  Solu medrol given by EMS. CBG 99. Alert and oriented x 4.

## 2021-04-27 NOTE — ED Notes (Signed)
Pt O2 started at 4LPM. Decreased to 2LPM with O2 at 96% but RR increased to 30-35 cpm. Pt reported, "I feel like i'm gasping for air." Increased to 4LPM at this time for comfort. Reports not finishing dialysis due to sob and weakness after 2 hours of dialysis. Not on home O2. Alert and oriented x 4. Will continue to monitor.

## 2021-04-27 NOTE — ED Provider Notes (Signed)
[facility] EMERGENCY DEPARTMENT Provider Note   CSN: [id number] Arrival date & time: 04/27/21  1550     History Chief Complaint  Patient presents with   Shortness of Breath    Christopher Davila is a 50 y.o. male.   Shortness of Breath  This patient is a 50 year old male with known history of diabetes, end-stage renal disease on dialysis for at least 20 years, current access is in his right upper extremity.  He was at dialysis today when he became acutely short of breath, he states that they have been trying to taper off of the oxygen because he was recently admitted to the hospital on BiPAP with high flow oxygen after developing COVID-19.  He is currently on 3 or 4 L by nasal cannula at home.  It was while he was at dialysis that he became short of breath.  This was 2 hours into his session.  He was transferred to the hospital noting that he was tachypneic and requiring 4 L on the way to the hospital.  The patient does not have any chest pain, he states that his shortness of breath is almost completely resolved at this time.  He denies any fevers or chills.  Review of the medical record shows that he was recently admitted to the hospital during a prolonged stay for COVID-19 and hypoxia and pleural effusion.  Past Medical History:  Diagnosis Date   Anemia    Blindness 09/04/2019   CHF (congestive heart failure) (HCC)    Diabetes mellitus    Diarrhea 10/14/2019   Epigastric abdominal pain    ESRD (end stage renal disease) ([place])    Gastroparesis due to DM ([place])    Hypertension    Hypoalbuminemia 04/17/2019   Hypoxia    Neuropathy of lower extremity    Oral candidiasis 06/26/2012   Pancreatitis 06/26/2012   Weight loss 06/26/2012    Patient Active Problem List   Diagnosis Date Noted   Shortness of breath    COVID-19    Hypoxia    End-stage renal disease on hemodialysis ([place])    Hypervolemia    History of thoracentesis    S/P thoracentesis    Acute  respiratory failure with hypoxia ([place]) 03/26/2021   Pressure injury of skin 11/06/2019   ESRD on dialysis [facility])    Heart failure with preserved ejection fraction ([place])    Goals of care, counseling/discussion    Palliative care encounter    Diarrhea 90/24/0973   Metabolic acidosis, increased anion gap 10/13/2019   Acute urinary retention 10/13/2019   Weakness 10/05/2019   Acute on chronic diastolic CHF (congestive heart failure) ([place]) 09/27/2019   Vision loss of left eye 09/21/2019   Acute kidney injury superimposed on chronic kidney disease ([place]) 09/19/2019   Hyponatremia 09/19/2019   Homeless 09/19/2019   Anemia of chronic disease 09/19/2019   Vision loss, left eye 09/19/2019   Acute loss of vision, right 06/16/2019   Hypertensive urgency 04/17/2019   Hypokalemia 04/17/2019   Normocytic anemia 04/17/2019   Hyperglycemia    CKD (chronic kidney disease) stage 4, GFR 15-29 ml/min (HCC)    Alcohol use    Gastroparesis due to DM ([place])    Essential hypertension    Hyperlipidemia    Type 2 diabetes mellitus with hypoglycemia ([place]) 06/26/2012    Past Surgical History:  Procedure Laterality Date   AV FISTULA PLACEMENT Right 10/29/2019   Procedure: RIGHT ARM BRACHIOBASILIC ARTERIOVENOUS (AV) FISTULA CREATION;  Surgeon: Curt Jews  F, MD;  Location: [facility] OR;  Service: Vascular;  Laterality: Right;   [place] Right 01/05/2020   Procedure: RIGHT ARM SECOND STAGE [place];  Surgeon: Rosetta Posner, MD;  Location: [facility] OR;  Service: Vascular;  Laterality: Right;   EYE SURGERY     IR FLUORO GUIDE CV LINE RIGHT  10/28/2019   IR US GUIDE VASC ACCESS RIGHT  10/28/2019       Family History  Problem Relation Age of Onset   Diabetes Mother    Lung disease Mother    Hypertension Mother    Diabetes Maternal Aunt    CAD Maternal Aunt    CAD Cousin     Social History   Tobacco Use   Smoking status: Former    Packs/day: 0.00    Types: Cigarettes    Quit  date: 03/22/2015    Years since quitting: 6.1   Smokeless tobacco: Never  Vaping Use   Vaping Use: Never used  Substance Use Topics   Alcohol use: Not Currently   Drug use: No    Home Medications Prior to Admission medications   Medication Sig Start Date End Date Taking? Authorizing Provider  albuterol (VENTOLIN HFA) 108 (90 Base) MCG/ACT inhaler Inhale 2 puffs into the lungs every 4 (four) hours as needed for shortness of breath.    [provider]  Amino Acids-Protein Hydrolys (FEEDING SUPPLEMENT, PRO-STAT SUGAR FREE 64,) LIQD Take 30 mLs by mouth 2 (two) times daily. Patient not taking: Reported on 04/26/2021    [provider]  amLODipine (NORVASC) 10 MG tablet Take 1 tablet (10 mg total) by mouth daily. 11/19/19   Charlynne Cousins, MD  aspirin EC 81 MG tablet Take 1 tablet (81 mg total) by mouth daily. 05/24/15   Ghimire, Henreitta Leber, MD  atorvastatin (LIPITOR) 40 MG tablet Take 1 tablet (40 mg total) by mouth daily. 12/05/20   [place], Peter M, MD  atropine 1 % ophthalmic solution Place 1 drop into both eyes in the morning and at bedtime. 04/24/21   [provider]  brimonidine (ALPHAGAN P) 0.1 % SOLN Place 1 drop into the left eye in the morning and at bedtime.    [provider]  calcium acetate (PHOSLO) 667 MG capsule Take 1 capsule (667 mg total) by mouth 3 (three) times daily with meals. 11/19/19   Charlynne Cousins, MD  cefTRIAXone (ROCEPHIN) 1 g injection SMARTSIG:1 Gram(s) IM Daily PRN 04/24/21   [provider]  Dextran 70-Hypromellose (TEARS PURE) 0.1-0.3 % SOLN Place 1 drop into both eyes in the morning, at noon, in the evening, and at bedtime.    [provider]  diphenhydrAMINE (BENADRYL) 25 MG tablet Take 25 mg by mouth every 8 (eight) hours as needed for itching.    [provider]  dorzolamide-timolol (COSOPT) 22.3-6.8 MG/ML ophthalmic solution Place 1 drop into the left eye 2 (two) times daily. 09/23/19   Geradine Girt, DO  ferrous sulfate 325 (65 FE) MG tablet Take 325 mg by mouth daily. 08/25/20   [provider]  guaiFENesin (ROBITUSSIN) 100 MG/5ML liquid Take 100 mg by mouth every 4 (four) hours as needed for cough.    [provider]  hydrALAZINE (APRESOLINE) 50 MG tablet Take 1.5 tablets (75 mg total) by mouth 3 (three) times daily. 04/26/21 04/21/22  Loel Dubonnet, NP  ibuprofen (ADVIL) 800 MG tablet Take 800 mg by mouth every 12 (twelve) hours as needed for mild  pain.    [provider]  latanoprost (XALATAN) 0.005 % ophthalmic solution Place 1 drop into the left eye at bedtime. 09/23/19   Geradine Girt, DO  levofloxacin (LEVAQUIN) 750 MG tablet Take 750 mg by mouth daily. For 5 days    [provider]  losartan (COZAAR) 100 MG tablet Take 1 tablet (100 mg total) by mouth daily. 11/19/19   Charlynne Cousins, MD  Melatonin 10 MG CAPS Take 10 mg by mouth at bedtime.    [provider]  Menthol, Topical Analgesic, 5 % GEL Apply 1 application topically every 12 (twelve) hours as needed (bilateral shoulder pain). Patient not taking: Reported on 04/26/2021    [provider]  olopatadine (PATANOL) 0.1 % ophthalmic solution Place 2 drops into both eyes daily.    [provider]  ondansetron (ZOFRAN) 4 MG tablet Take 4 mg by mouth every 6 (six) hours as needed for nausea or vomiting.    [provider]  oxyCODONE-acetaminophen (PERCOCET/ROXICET) 5-325 MG tablet Take 1 tablet by mouth See admin instructions. 1 tablet once daily every Tuesday, Thursday, Saturday, and Sunday. May take an additional 1 tablet every 6 hours as needed on Monday's Wednesday's, and Friday's. 04/10/21   Darliss Cheney, MD  OXYGEN Inhale 3 L into the lungs continuous.    [provider]  polyethylene glycol (MIRALAX / GLYCOLAX) 17 g packet Take 17 g by mouth daily.    [provider]  prednisoLONE acetate (PRED FORTE) 1 % ophthalmic  suspension Place 1 drop into both eyes 4 (four) times daily.    [provider]  pregabalin (LYRICA) 25 MG capsule Take 25 mg by mouth 2 (two) times daily. 08/25/20   [provider]  senna (SENOKOT) 8.6 MG TABS tablet Take 2 tablets by mouth at bedtime.    [provider]  sertraline (ZOLOFT) 25 MG tablet Take 25 mg by mouth daily. On Monday's, Wednesday's, and Friday's, take after dialysis. All other days take in the morning.    [provider]  torsemide (DEMADEX) 20 MG tablet Take 80 mg by mouth 2 (two) times daily.    [provider]  Vitamin D, Ergocalciferol, (DRISDOL) 1.25 MG (50000 UNIT) CAPS capsule Take 50,000 Units by mouth every 7 (seven) days. Sunday's    [provider]    Allergies    Morphine and related  Review of Systems   Review of Systems  Respiratory:  Positive for shortness of breath.   All other systems reviewed and are negative.  Physical Exam Updated Vital Signs BP (!) 143/87   Pulse 99   Temp 97.9 F (36.6 C) (Oral)   Resp 20   Ht 1.6 m (5\' 3" )   Wt 58.9 kg   SpO2 92%   BMI 23.00 kg/m   Physical Exam Vitals and nursing note reviewed.  Constitutional:      General: He is not in acute distress.    Appearance: He is well-developed.  HENT:     Head: Normocephalic and atraumatic.     Mouth/Throat:     Pharynx: No oropharyngeal exudate.  Eyes:     General: No scleral icterus.       Right eye: No discharge.        Left eye: No discharge.     Conjunctiva/sclera: Conjunctivae normal.     Pupils: Pupils are equal, round, and reactive to light.  Neck:     Thyroid: No thyromegaly.     Vascular:  No JVD.  Cardiovascular:     Rate and Rhythm: Regular rhythm. Tachycardia present.     Heart sounds: Normal heart sounds. No murmur heard.   No friction rub. No gallop.     Comments: Minimally tachycardic Pulmonary:     Effort: Pulmonary effort is normal. No respiratory distress.     Breath sounds: Rales  present. No wheezing.     Comments: Subtle rales at bases bialterally - RR of 22, clear lung fields upper lungs and speaks in full sentences.  On 3 L by nasal cannula he is 98% Abdominal:     General: Bowel sounds are normal. There is no distension.     Palpations: Abdomen is soft. There is no mass.     Tenderness: There is no abdominal tenderness.  Musculoskeletal:        General: No tenderness. Normal range of motion.     Cervical back: Normal range of motion and neck supple.     Right lower leg: Edema present.     Left lower leg: Edema present.     Comments: Scant symmetrical bilateral pitting edema of the ankles and feet  Lymphadenopathy:     Cervical: No cervical adenopathy.  Skin:    General: Skin is warm and dry.     Findings: No erythema or rash.  Neurological:     General: No focal deficit present.     Mental Status: He is alert.     Coordination: Coordination normal.  Psychiatric:        Behavior: Behavior normal.    ED Results / Procedures / Treatments   Labs (all labs ordered are listed, but only abnormal results are displayed) Labs Reviewed  CBC WITH DIFFERENTIAL/PLATELET - Abnormal; Notable for the following components:      Result Value   RBC 3.39 (*)    Hemoglobin 9.6 (*)    HCT 32.8 (*)    MCHC 29.3 (*)    RDW 18.4 (*)    Lymphs Abs 0.5 (*)    All other components within normal limits  COMPREHENSIVE METABOLIC PANEL - Abnormal; Notable for the following components:   Chloride 96 (*)    Glucose, Bld 103 (*)    Creatinine, Ser 3.07 (*)    Calcium 8.5 (*)    Total Protein 6.4 (*)    Albumin 3.1 (*)    GFR, Estimated 24 (*)    All other components within normal limits    EKG EKG Interpretation  Date/Time:  Friday April 27 2021 16:02:22 EST Ventricular Rate:  102 PR Interval:  171 QRS Duration: 87 QT Interval:  375 QTC Calculation: 489 R Axis:   4 Text Interpretation: Sinus tachycardia Low voltage, extremity leads Nonspecific T abnormalities,  lateral leads Borderline prolonged QT interval since last tracing no significant change Confirmed by Noemi Chapel [phone]) on 04/27/2021 4:14:10 PM  Radiology DG Chest Port 1 View  Result Date: 04/27/2021 CLINICAL DATA:  Shortness of breath a EXAM: PORTABLE CHEST 1 VIEW COMPARISON:  04/03/2021 FINDINGS: Stable cardiomediastinal contours. Moderate left and small-moderate right pleural effusions. Left-sided pleural effusion is slightly increased from prior. Patchy bibasilar airspace opacities, left worse than right, also progressed from prior. No pneumothorax. IMPRESSION: Moderate left and small-moderate right pleural effusions with associated bibasilar airspace opacities, progressed from prior. Electronically Signed   By: Davina Poke D.O.   On: 04/27/2021 16:47    Procedures Procedures   Medications Ordered in ED Medications  oxyCODONE-acetaminophen (PERCOCET/ROXICET) 5-325 MG per tablet 2 tablet (  2 tablets Oral Given 04/27/21 1657)  ipratropium-albuterol (DUONEB) 0.5-2.5 (3) MG/3ML nebulizer solution 3 mL (3 mLs Nebulization Given 04/27/21 1845)    ED Course  I have reviewed the triage vital signs and the nursing notes.  Pertinent labs & imaging results that were available during my care of the patient were reviewed by me and considered in my medical decision making (see chart for details).    MDM Rules/Calculators/A&P                           This patient is chronically ill with end-stage renal disease, congestive heart failure, recently diagnosed with hypoxic respiratory failure secondary to COVID-19 and presents today with shortness of breath from dialysis.  He was actually seen at the cardiology office yesterday and was doing very well.  It sounds as though he is trying to be tapered off of his oxygen and is not tolerating it, otherwise the patient does not appear to be in any distress.  He is requesting some pain medicine for his chronic pain.  He has no issues with his airway  at this time, will check a chest x-ray to follow-up on prior effusions  I have reviewed the patient's studies including his blood work and his x-ray.  The x-ray does not fact show some ongoing bilateral pleural effusions, the patient states he is back to his baseline, he received 1 nebulizer treatment and has been on oxygen between 3 and 4 L.  I asked the pulmonary critical care service to see the patient in consultation and make formal recommendations.  They would like the patient to be seen in the office and do not think he needs to be admitted to the hospital.  I agree and think at this time the patient is stable for discharge and can follow-up for outpatient thoracentesis and planning for further work-up and pulmonary care.  Final Clinical Impression(s) / ED Diagnoses Final diagnoses:  Shortness of breath     Noemi Chapel, MD 04/27/21 2137

## 2021-04-27 NOTE — Consult Note (Addendum)
NAME:  Christopher Davila, MRN:  [id number], DOB:  August 15, 1970, LOS: 0 ADMISSION DATE:  04/27/2021, CONSULTATION DATE:  11/11 REFERRING MD:  Sabra Heck MD, CHIEF COMPLAINT:  Shortness of breath   History of Present Illness:  Christopher Davila is a 50 y.o. male who is seen in consultation at the request of Dr. Sabra Heck for recommendations on further evaluation and management of pleural effusion  Christopher Davila, is a 49 y.o. male, who presented to the [facility] ED with a chief complaint of SOB.  They have a pertinent past medical history of Chronic resipratory failure requiring 3-4L O2, COVID 10/22, HTN, ESRD, Anemia, blindness of left eye, DM, gastroparesis secondary to DM, pancreatitis, diastolic CHF  While undergoing dialysis on 11/11, the patient became acutely hypoxic. They were placed on NRB and transferred to [facility].  ED course was notable for impoved oxygenation on 3-4LPM. CXR was obtained which was concerning for a left pleural effusion which was increased in size compared to imaging on 10/1  PCCM was consulted for evaluation.   Pertinent  Medical History  COVID 10/22, HTN, ESRD, Anemia, blindness of left eye, DM, gastroparesis secondary to DM, pancreatitis, diastolic CHF  Significant Hospital Events: Including procedures, antibiotic start and stop dates in addition to other pertinent events     Interim History / Subjective:  On 4 L o2  Subjective: Not actively SOB, denies chest pain, fever, chills, productive cough, abnormal sputum. Endorses increased WOB when ambulating.   Objective   Blood pressure (!) 143/87, pulse 99, temperature 97.9 F (36.6 C), temperature source Oral, resp. rate 20, height 5\' 3"  (1.6 m), weight 58.9 kg, SpO2 92 %.       No intake or output data in the 24 hours ending 04/27/21 1919 Filed Weights   04/27/21 1603  Weight: 58.9 kg    Examination: General: In bed, NAD, appears comfortable HEENT: MM pink/moist, anicteric, atraumatic Neuro: RASS 0, PERRL, GCS 15, MAE CV:  S1S2, ST, no m/r/g appreciated PULM:  slight crackles in the upper lobes and in the lower lobes, slightly diminished L base, trachea midline, chest expansion symmetric GI: soft, bsx4 active, non-tender   Extremities: warm/dry, no pretibial edema, capillary refill less than 3 seconds  Skin:  no rashes or lesions noted  Resolved Hospital Problem list     Assessment & Plan:  L pleural effusion Hx of COVID 10/11 Chronic respiratory failure with hypoxia Protein malnutrition: Unspecified CXR reviewed: L effusion increased compared to previous imaging. No fever. No Leukocytosis. Returned to baseline O2 requirement prior to arrival. Believe that effusion is likely transudative due to lack of symptoms and HX of ESRD. Albumin 3. Hx of thoracentesis on 10/10. P: -No indication for emergent thoracentesis at this time.  -Goal SPO2 92%-98%, on 4L, attempt to wean to 3L baseline as able -Pulmonary toilet as able -Monitor for s/s of infection: Fever, increased SOB, decreased level of consciousness, increased WOB, increasing oxygenation requirement. Return to ED if this occurs.  -Outpatient Pulmonology follow up recommended in 1-2 weeks. Have placed referral to [place] clinic for appointment scheduling. CXR at outpatient appointment with consideration of thoracentesis at this time.   Best Practice (right click and "Reselect all SmartList Selections" daily)    Labs   CBC: Recent Labs  Lab 04/27/21 1624  WBC 5.9  NEUTROABS 5.0  HGB 9.6*  HCT 32.8*  MCV 96.8  PLT 937    Basic Metabolic Panel: Recent Labs  Lab 04/27/21 1624  NA 135  K 3.9  CL  96*  CO2 28  GLUCOSE 103*  BUN 12  CREATININE 3.07*  CALCIUM 8.5*   GFR: Estimated Creatinine Clearance: 23.4 mL/min (A) (by C-G formula based on SCr of 3.07 mg/dL (H)). Recent Labs  Lab 04/27/21 1624  WBC 5.9    Liver Function Tests: Recent Labs  Lab 04/27/21 1624  AST 25  ALT 13  ALKPHOS 101  BILITOT 0.9  PROT 6.4*  ALBUMIN 3.1*    No results for input(s): LIPASE, AMYLASE in the last 168 hours. No results for input(s): AMMONIA in the last 168 hours.  ABG    Component Value Date/Time   PHART 7.235 (L) 10/25/2019 1210   PCO2ART 55.1 (H) 10/25/2019 1210   PO2ART 127 (H) 10/25/2019 1210   HCO3 22.5 10/25/2019 1210   TCO2 27 03/26/2021 0609   ACIDBASEDEF 3.9 (H) 10/25/2019 1210   O2SAT 98.9 10/25/2019 1210     Coagulation Profile: No results for input(s): INR, PROTIME in the last 168 hours.  Cardiac Enzymes: No results for input(s): CKTOTAL, CKMB, CKMBINDEX, TROPONINI in the last 168 hours.  HbA1C: Hgb A1c MFr Bld  Date/Time Value Ref Range Status  03/26/2021 05:53 AM 4.6 (L) 4.8 - 5.6 % Final    Comment:    (NOTE) Pre diabetes:          5.7%-6.4%  Diabetes:              >6.4%  Glycemic control for   <7.0% adults with diabetes   09/19/2019 11:31 AM 6.4 (H) 4.8 - 5.6 % Final    Comment:    (NOTE) Pre diabetes:          5.7%-6.4% Diabetes:              >6.4% Glycemic control for   <7.0% adults with diabetes     CBG: No results for input(s): GLUCAP in the last 168 hours.  Review of Systems:   Positives in bold  Gen: fever, chills, weight change, fatigue, night sweats HEENT:  blurred vision, double vision, hearing loss, tinnitus, sinus congestion, rhinorrhea, sore throat, neck stiffness, dysphagia PULM:  shortness of breath, cough, sputum production, hemoptysis, wheezing CV: chest pain, edema, orthopnea, paroxysmal nocturnal dyspnea, palpitations GI:  abdominal pain, nausea, vomiting, diarrhea, hematochezia, melena, constipation, change in bowel habits GU: dysuria, hematuria, polyuria, oliguria, urethral discharge Endocrine: hot or cold intolerance, polyuria, polyphagia or appetite change Derm: rash, dry skin, scaling or peeling skin change Heme: easy bruising, bleeding, bleeding gums Neuro: headache, numbness, weakness, slurred speech, loss of memory or consciousness   Past Medical  History:  He,  has a past medical history of Anemia, Blindness (09/04/2019), CHF (congestive heart failure) ([place]), Diabetes mellitus, Diarrhea (10/14/2019), Epigastric abdominal pain, ESRD (end stage renal disease) ([place]), Gastroparesis due to DM ([place]), Hypertension, Hypoalbuminemia (04/17/2019), Hypoxia, Neuropathy of lower extremity, Oral candidiasis (06/26/2012), Pancreatitis (06/26/2012), and Weight loss (06/26/2012).   Surgical History:   Past Surgical History:  Procedure Laterality Date   AV FISTULA PLACEMENT Right 10/29/2019   Procedure: RIGHT ARM BRACHIOBASILIC ARTERIOVENOUS (AV) FISTULA CREATION;  Surgeon: Rosetta Posner, MD;  Location: [place];  Service: Vascular;  Laterality: Right;   [place] Right 01/05/2020   Procedure: RIGHT ARM SECOND STAGE [place];  Surgeon: Rosetta Posner, MD;  Location: [place];  Service: Vascular;  Laterality: Right;   EYE SURGERY     IR FLUORO GUIDE CV LINE RIGHT  10/28/2019   IR US GUIDE VASC ACCESS RIGHT  10/28/2019  Social History:   reports that he quit smoking about 6 years ago. His smoking use included cigarettes. He has never used smokeless tobacco. He reports that he does not currently use alcohol. He reports that he does not use drugs.   Family History:  His family history includes CAD in his cousin and maternal aunt; Diabetes in his maternal aunt and mother; Hypertension in his mother; Lung disease in his mother.   Allergies Allergies  Allergen Reactions   Morphine And Related Itching and Other (See Comments)    Pt prefers not to be given this drug     Home Medications  Prior to Admission medications   Medication Sig Start Date End Date Taking? Authorizing Provider  albuterol (VENTOLIN HFA) 108 (90 Base) MCG/ACT inhaler Inhale 2 puffs into the lungs every 4 (four) hours as needed for shortness of breath.    [provider]  Amino Acids-Protein Hydrolys (FEEDING SUPPLEMENT, PRO-STAT SUGAR FREE 64,)  LIQD Take 30 mLs by mouth 2 (two) times daily. Patient not taking: Reported on 04/26/2021    [provider]  amLODipine (NORVASC) 10 MG tablet Take 1 tablet (10 mg total) by mouth daily. 11/19/19   Charlynne Cousins, MD  aspirin EC 81 MG tablet Take 1 tablet (81 mg total) by mouth daily. 05/24/15   Ghimire, Henreitta Leber, MD  atorvastatin (LIPITOR) 40 MG tablet Take 1 tablet (40 mg total) by mouth daily. 12/05/20   [place], Peter M, MD  atropine 1 % ophthalmic solution Place 1 drop into both eyes in the morning and at bedtime. 04/24/21   [provider]  brimonidine (ALPHAGAN P) 0.1 % SOLN Place 1 drop into the left eye in the morning and at bedtime.    [provider]  calcium acetate (PHOSLO) 667 MG capsule Take 1 capsule (667 mg total) by mouth 3 (three) times daily with meals. 11/19/19   Charlynne Cousins, MD  cefTRIAXone (ROCEPHIN) 1 g injection SMARTSIG:1 Gram(s) IM Daily PRN 04/24/21   [provider]  Dextran 70-Hypromellose (TEARS PURE) 0.1-0.3 % SOLN Place 1 drop into both eyes in the morning, at noon, in the evening, and at bedtime.    [provider]  diphenhydrAMINE (BENADRYL) 25 MG tablet Take 25 mg by mouth every 8 (eight) hours as needed for itching.    [provider]  dorzolamide-timolol (COSOPT) 22.3-6.8 MG/ML ophthalmic solution Place 1 drop into the left eye 2 (two) times daily. 09/23/19   Geradine Girt, DO  ferrous sulfate 325 (65 FE) MG tablet Take 325 mg by mouth daily. 08/25/20   [provider]  guaiFENesin (ROBITUSSIN) 100 MG/5ML liquid Take 100 mg by mouth every 4 (four) hours as needed for cough.    [provider]  hydrALAZINE (APRESOLINE) 50 MG tablet Take 1.5 tablets (75 mg total) by mouth 3 (three) times daily. 04/26/21 04/21/22  Loel Dubonnet, NP  ibuprofen (ADVIL) 800 MG tablet Take 800 mg by mouth every 12 (twelve) hours as needed for mild pain.    [provider]  latanoprost (XALATAN)  0.005 % ophthalmic solution Place 1 drop into the left eye at bedtime. 09/23/19   Geradine Girt, DO  levofloxacin (LEVAQUIN) 750 MG tablet Take 750 mg by mouth daily. For 5 days    [provider]  losartan (COZAAR) 100 MG tablet Take 1 tablet (100 mg total) by mouth daily. 11/19/19   Charlynne Cousins, MD  Melatonin 10 MG CAPS Take 10 mg  by mouth at bedtime.    [provider]  Menthol, Topical Analgesic, 5 % GEL Apply 1 application topically every 12 (twelve) hours as needed (bilateral shoulder pain). Patient not taking: Reported on 04/26/2021    [provider]  olopatadine (PATANOL) 0.1 % ophthalmic solution Place 2 drops into both eyes daily.    [provider]  ondansetron (ZOFRAN) 4 MG tablet Take 4 mg by mouth every 6 (six) hours as needed for nausea or vomiting.    [provider]  oxyCODONE-acetaminophen (PERCOCET/ROXICET) 5-325 MG tablet Take 1 tablet by mouth See admin instructions. 1 tablet once daily every Tuesday, Thursday, Saturday, and Sunday. May take an additional 1 tablet every 6 hours as needed on Monday's Wednesday's, and Friday's. 04/10/21   Darliss Cheney, MD  OXYGEN Inhale 3 L into the lungs continuous.    [provider]  polyethylene glycol (MIRALAX / GLYCOLAX) 17 g packet Take 17 g by mouth daily.    [provider]  prednisoLONE acetate (PRED FORTE) 1 % ophthalmic suspension Place 1 drop into both eyes 4 (four) times daily.    [provider]  pregabalin (LYRICA) 25 MG capsule Take 25 mg by mouth 2 (two) times daily. 08/25/20   [provider]  senna (SENOKOT) 8.6 MG TABS tablet Take 2 tablets by mouth at bedtime.    [provider]  sertraline (ZOLOFT) 25 MG tablet Take 25 mg by mouth daily. On Monday's, Wednesday's, and Friday's, take after dialysis. All other days take in the morning.    [provider]  torsemide (DEMADEX) 20 MG tablet Take 80 mg by mouth 2 (two) times  daily.    [provider]  Vitamin D, Ergocalciferol, (DRISDOL) 1.25 MG (50000 UNIT) CAPS capsule Take 50,000 Units by mouth every 7 (seven) days. Sunday's    [provider]     Critical care time: n/a    Redmond [employer]., MSN, APRN, AGACNP-BC [facility]  04/27/2021 , 7:40 PM  Please see [email or web address] for pager details  If no response, please call [phone] After hours, please call Elink at [phone]

## 2021-04-28 DIAGNOSIS — R0602 Shortness of breath: Secondary | ICD-10-CM | POA: Diagnosis not present

## 2021-04-28 MED ORDER — OXYCODONE-ACETAMINOPHEN 5-325 MG PO TABS
1.0000 | ORAL_TABLET | Freq: Once | ORAL | Status: AC
  Administered 2021-04-28: 1 via ORAL
  Filled 2021-04-28: qty 1

## 2021-04-28 NOTE — ED Notes (Signed)
Report called to [facility], [place], [place].

## 2021-05-01 ENCOUNTER — Ambulatory Visit (INDEPENDENT_AMBULATORY_CARE_PROVIDER_SITE_OTHER): Payer: Medicare Other | Admitting: Podiatry

## 2021-05-01 ENCOUNTER — Other Ambulatory Visit: Payer: Self-pay

## 2021-05-01 DIAGNOSIS — B351 Tinea unguium: Secondary | ICD-10-CM

## 2021-05-01 DIAGNOSIS — M79674 Pain in right toe(s): Secondary | ICD-10-CM

## 2021-05-01 DIAGNOSIS — M79675 Pain in left toe(s): Secondary | ICD-10-CM | POA: Diagnosis not present

## 2021-05-01 NOTE — Progress Notes (Signed)
  Subjective:  Patient ID: Christopher Davila, male    DOB: [birth year],  MRN: [id number]  Chief Complaint  Patient presents with   Nail Problem    Diabetic nail trim    50 y.o. male presents with the above complaint. History confirmed with patient.  Lives in assisted living.  He says he has type 2 diabetes but does not know what medications he takes.  Nails were thickened elongated and painful  Objective:  Physical Exam: warm, good capillary refill, no trophic changes or ulcerative lesions, and normal DP and PT pulses. Left Foot: dystrophic yellowed discolored nail plates with subungual debris Right Foot: dystrophic yellowed discolored nail plates with subungual debris  Assessment:   1. Pain due to onychomycosis of toenails of both feet      Plan:  Patient was evaluated and treated and all questions answered.  Patient educated on diabetes. Discussed proper diabetic foot care and discussed risks and complications of disease. Educated patient in depth on reasons to return to the office immediately should he/she discover anything concerning or new on the feet. All questions answered. Discussed proper shoes as well.   Discussed the etiology and treatment options for the condition in detail with the patient. Educated patient on the topical and oral treatment options for mycotic nails. Recommended debridement of the nails today. Sharp and mechanical debridement performed of all painful and mycotic nails today. Nails debrided in length and thickness using a nail nipper to level of comfort. Discussed treatment options including appropriate shoe gear. Follow up as needed for painful nails.    Return in about 3 months (around 08/01/2021) for at risk diabetic foot care.

## 2021-05-01 NOTE — Telephone Encounter (Signed)
Call made to patient, confirmed DOB. Patient is currently residing at a [facility] [phone]. He has an appt set up for 05/03/21. Transportation has already been set up.   Nothing further needed at this time.

## 2021-05-03 ENCOUNTER — Other Ambulatory Visit (HOSPITAL_COMMUNITY)
Admission: RE | Admit: 2021-05-03 | Discharge: 2021-05-03 | Disposition: A | Discharge status: Home / Self Care | Payer: Medicare Other | Source: Ambulatory Visit | Attending: Internal Medicine | Admitting: Internal Medicine

## 2021-05-03 ENCOUNTER — Other Ambulatory Visit: Payer: Self-pay

## 2021-05-03 ENCOUNTER — Ambulatory Visit (INDEPENDENT_AMBULATORY_CARE_PROVIDER_SITE_OTHER): Payer: Medicare Other | Admitting: Internal Medicine

## 2021-05-03 ENCOUNTER — Ambulatory Visit (INDEPENDENT_AMBULATORY_CARE_PROVIDER_SITE_OTHER): Payer: Medicare Other

## 2021-05-03 ENCOUNTER — Encounter: Payer: Self-pay | Admitting: Internal Medicine

## 2021-05-03 VITALS — BP 124/74 | HR 94 | Temp 98.5°F | Ht 63.0 in | Wt 127.8 lb

## 2021-05-03 DIAGNOSIS — N186 End stage renal disease: Secondary | ICD-10-CM | POA: Diagnosis not present

## 2021-05-03 DIAGNOSIS — J90 Pleural effusion, not elsewhere classified: Secondary | ICD-10-CM | POA: Diagnosis not present

## 2021-05-03 LAB — BODY FLUID CELL COUNT WITH DIFFERENTIAL
Eos, Fluid: 0 %
Lymphs, Fluid: 73 %
Monocyte-Macrophage-Serous Fluid: 15 % — ABNORMAL LOW (ref 50–90)
Neutrophil Count, Fluid: 12 % (ref 0–25)
Total Nucleated Cell Count, Fluid: 316 cu mm (ref 0–1000)

## 2021-05-03 LAB — LACTATE DEHYDROGENASE, PLEURAL OR PERITONEAL FLUID: LD, Fluid: 100 U/L — ABNORMAL HIGH (ref 3–23)

## 2021-05-03 NOTE — Progress Notes (Signed)
Christopher Davila    [id number]    Apr 26, 1971  Primary Care Physician:Blake, Rozanna Boer, MD  Referring Physician: [name], MD [address] [place],  [place] [address] Reason for Consultation: respiratory failure Date of Consultation: 05/03/2021  Chief complaint:   Chief Complaint  Patient presents with   Consult    Patient reports that he is doing well.       HPI: Christopher Davila is a 50 y.o. man with ESRD due to Diabetic nephropathy who was recently hospitalized in October 2022 for covid 19 infection.  He has been having worsening hypoxemia and shortness of breath. During this hospital stay they also removed lots of fluid through dialysis and he had a thoracentesis to remove pleural effusion. He was discharged on [facility]   Been having worsening dyspnea towards the end of dialysis sessions. Last week this led to ED visit. He is now feeling better but notes limited mobility due to blindness, neuropathy.   He hasn't missed any dialysis sessions - goes MWF. Has been at [facility] since April 2022. He went to dialysis yesterday and they removed 2.1. he is now on [facility].   Denies any fevers, chills, night sweats weight loss.   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  Exposures: lives in nursing home Smoking history: former tobacco use, remote  Social History   Occupational History   Occupation: Social research officer, government, unemployed  Tobacco Use   Smoking status: Former    Packs/day: 0.00    Types: Cigarettes    Quit date: 03/22/2015    Years since quitting: 6.1   Smokeless tobacco: Never  Vaping Use   Vaping Use: Never used  Substance and Sexual Activity   Alcohol use: Not Currently   Drug use: No   Sexual activity: Not on file    Relevant family history:  Family History  Problem Relation Age of Onset   Diabetes Mother    Lung disease Mother    Hypertension Mother    Diabetes Maternal Aunt    CAD Maternal  Aunt    CAD Cousin     Past Medical History:  Diagnosis Date   Anemia    Blindness 09/04/2019   CHF (congestive heart failure) (H[facility])    Diabetes mellitus    Diarrhea 10/14/2019   Epigastric abdominal pain    ESRD (end stage renal disease) ([facility])    Gastroparesis due to DM ([place])    Hypertension    Hypoalbuminemia 04/17/2019   Hypoxia    Neuropathy of lower extremity    Oral candidiasis 06/26/2012   Pancreatitis 06/26/2012   Weight loss 06/26/2012    Past Surgical History:  Procedure Laterality Date   AV FISTULA PLACEMENT Right 10/29/2019   Procedure: RIGHT ARM BRACHIOBASILIC ARTERIOVENOUS (AV) FISTULA CREATION;  Surgeon: Rosetta Posner, MD;  [facility];  Service: Vascular;  Laterality: Right;   [place] Right 01/05/2020   Procedure: RIGHT ARM SECOND STAGE [place];  Surgeon: Rosetta Posner, MD;  Location: [facility];  Service: Vascular;  Laterality: Right;   EYE SURGERY     IR FLUORO GUIDE CV LINE RIGHT  10/28/2019   IR US GUIDE VASC ACCESS RIGHT  10/28/2019     Physical Exam: Blood pressure 124/74, pulse 94, temperature 98.5 F (36.9 C), temperature source Oral, height 5\' 3"  (1.6 m), weight 127 lb 12.8 oz (58 kg), SpO2 98 %. Gen:  No acute distress, chronically ill appearing ENT:  no nasal polyps, mucus membranes moist Lungs:    No increased respiratory effort, symmetric chest wall excursion, diminished bilateral bases CV:         Regular rate and rhythm; no murmurs, rubs, or gallops.  Mild  pedal edema in compression socks Abd:      + bowel sounds; soft, non-tender; no distension MSK: no acute synovitis of DIP or PIP joints, no mechanics hands.  Skin:      Warm and dry; no rashes Neuro: normal speech, no focal facial asymmetry Psych: alert and oriented x3, normal mood and affect Ext: RUE AVF +thrill and bruit  Data Reviewed/Medical Decision Making:  Independent interpretation of tests: Imaging:  Review of patient's chest xray   images revealed bilateral pleural effusions on Apr 27 2021 with patchy multifocal airspace opacities. The patient's images have been independently reviewed by me.    PFTs:  No flowsheet data found.  Labs:  Lab Results  Component Value Date   WBC 5.9 04/27/2021   HGB 9.6 (L) 04/27/2021   HCT 32.8 (L) 04/27/2021   MCV 96.8 04/27/2021   PLT 151 04/27/2021   Lab Results  Component Value Date   NA 135 04/27/2021   K 3.9 04/27/2021   CL 96 (L) 04/27/2021   CO2 28 04/27/2021     Immunization status:  Immunization History  Administered Date(s) Administered   Hepatitis B, adult 01/11/2020, 02/03/2020, 03/02/2020, 05/04/2020   Hepb-cpg 01/11/2020, 02/03/2020, 03/02/2020, 05/04/2020   Influenza Split 06/27/2012   Influenza,inj,Quad PF,6+ Mos 05/24/2015, 03/14/2020, 04/18/2021   Influenza-Unspecified 05/24/2015, 03/14/2020   PFIZER(Purple Top)SARS-COV-2 Vaccination 03/24/2021, 04/17/2021   Pneumococcal Conjugate-13 02/22/2020   Pneumococcal Polysaccharide-23 02/20/2019     I reviewed prior external note(s) from hospital stay  I reviewed the result(s) of the labs and imaging as noted above.   I have ordered ultrasound and thoracentesis  Assessment:  Pleural Effusion bilateral ESRD  Plan/Recommendations: I do not see that pleural fluid studies were sent while inpatient. Suspect transudative effusion from dialysis.  We discussed risks and benefits of procedure, ultrasound guided thoracentesis. He is agreeable to proceed and consent was signed.   See separate procedure note.   Post procedure chest xray shows residual left sided effusion, no ptx.  Fluid studies sent. Will contact with results.  Recommend more aggressive fluid removal with dialysis   We discussed disease management and progression at length today regarding fluid management.   Return to Care: Return in about 6 months (around 10/31/2021).  Lenice Llamas, MD [facility] [place]  CC: [name], MD

## 2021-05-03 NOTE — Progress Notes (Signed)
  Thoracentesis  Procedure Note  Christopher Davila  [id number]  [birth year]  Date:05/03/21  Time:12:01 PM   Provider Performing:Tilmon Wisehart Chauncey Cruel Shearon Stalls   Procedure: Thoracentesis with imaging guidance (18841)  Indication(s) Pleural Effusion  Consent Risks of the procedure as well as the alternatives and risks of each were explained to the patient and/or caregiver.  Consent for the procedure was obtained and is signed in the bedside chart  Anesthesia Topical only with 1% lidocaine    Time Out Verified patient identification, verified procedure, site/side was marked, verified correct patient position, special equipment/implants available, medications/allergies/relevant history reviewed, required imaging and test results available.   Sterile Technique Maximal sterile technique including full sterile barrier drape, hand hygiene, sterile gown, sterile gloves, mask, hair covering, sterile ultrasound probe cover (if used).  Procedure Description Ultrasound was used to identify appropriate pleural anatomy for placement and overlying skin marked.  Area of drainage cleaned and draped in sterile fashion. Lidocaine was used to anesthetize the skin and subcutaneous tissue.  1500 cc's of serous appearing fluid was drained from the left pleural space. Catheter then removed and bandaid applied to site.   Complications/Tolerance None; patient tolerated the procedure well. Chest X-ray is ordered to confirm no post-procedural complication. Reviewed and without ptx.    EBL Minimal   Specimen(s) Pleural fluid sent for cell count, cytology, chemistry and culture.   Lenice Llamas, MD Pulmonary and [place]

## 2021-05-03 NOTE — Patient Instructions (Signed)
Please schedule follow up scheduled with myself in 6 months.  If my schedule is not open yet, we will contact you with a reminder closer to that time.  I will contact you if there are any updates on the fluid studies we sent today.  We need to manage this fluid with dialysis rather than doing recurrent thoracentesis - it will cause you pain and scarring if we keep doing this procedure.

## 2021-05-04 LAB — GLUCOSE, SYNOVIAL FLUID: Glucose, Synovial Fluid: 136 mg/dL

## 2021-05-04 LAB — PROTEIN, BODY FLUID (OTHER): Protein, Fluid: 2.4 g/dL

## 2021-05-04 LAB — CYTOLOGY - NON PAP

## 2021-05-06 ENCOUNTER — Emergency Department (HOSPITAL_COMMUNITY): Payer: Medicare Other

## 2021-05-06 ENCOUNTER — Other Ambulatory Visit: Payer: Self-pay

## 2021-05-06 ENCOUNTER — Inpatient Hospital Stay (HOSPITAL_COMMUNITY)
Admission: EM | Admit: 2021-05-06 | Discharge: 2021-05-10 | DRG: 193 | Disposition: A | Discharge status: Skilled Nursing Facility | Payer: Medicare Other | Source: Skilled Nursing Facility | Attending: Family Medicine | Admitting: Family Medicine

## 2021-05-06 ENCOUNTER — Encounter (HOSPITAL_COMMUNITY): Payer: Self-pay

## 2021-05-06 DIAGNOSIS — Z8249 Family history of ischemic heart disease and other diseases of the circulatory system: Secondary | ICD-10-CM

## 2021-05-06 DIAGNOSIS — J189 Pneumonia, unspecified organism: Secondary | ICD-10-CM | POA: Diagnosis not present

## 2021-05-06 DIAGNOSIS — Z7982 Long term (current) use of aspirin: Secondary | ICD-10-CM

## 2021-05-06 DIAGNOSIS — N2581 Secondary hyperparathyroidism of renal origin: Secondary | ICD-10-CM | POA: Diagnosis present

## 2021-05-06 DIAGNOSIS — I5032 Chronic diastolic (congestive) heart failure: Secondary | ICD-10-CM | POA: Diagnosis present

## 2021-05-06 DIAGNOSIS — N186 End stage renal disease: Secondary | ICD-10-CM | POA: Diagnosis not present

## 2021-05-06 DIAGNOSIS — J9621 Acute and chronic respiratory failure with hypoxia: Secondary | ICD-10-CM | POA: Diagnosis present

## 2021-05-06 DIAGNOSIS — E1143 Type 2 diabetes mellitus with diabetic autonomic (poly)neuropathy: Secondary | ICD-10-CM | POA: Diagnosis present

## 2021-05-06 DIAGNOSIS — Z9981 Dependence on supplemental oxygen: Secondary | ICD-10-CM | POA: Diagnosis not present

## 2021-05-06 DIAGNOSIS — I132 Hypertensive heart and chronic kidney disease with heart failure and with stage 5 chronic kidney disease, or end stage renal disease: Secondary | ICD-10-CM | POA: Diagnosis present

## 2021-05-06 DIAGNOSIS — E871 Hypo-osmolality and hyponatremia: Secondary | ICD-10-CM | POA: Diagnosis present

## 2021-05-06 DIAGNOSIS — D6489 Other specified anemias: Secondary | ICD-10-CM | POA: Diagnosis present

## 2021-05-06 DIAGNOSIS — I5030 Unspecified diastolic (congestive) heart failure: Secondary | ICD-10-CM | POA: Diagnosis present

## 2021-05-06 DIAGNOSIS — Z79899 Other long term (current) drug therapy: Secondary | ICD-10-CM | POA: Diagnosis not present

## 2021-05-06 DIAGNOSIS — Z87891 Personal history of nicotine dependence: Secondary | ICD-10-CM | POA: Diagnosis not present

## 2021-05-06 DIAGNOSIS — H409 Unspecified glaucoma: Secondary | ICD-10-CM | POA: Diagnosis present

## 2021-05-06 DIAGNOSIS — R338 Other retention of urine: Secondary | ICD-10-CM | POA: Diagnosis present

## 2021-05-06 DIAGNOSIS — J9601 Acute respiratory failure with hypoxia: Secondary | ICD-10-CM | POA: Diagnosis not present

## 2021-05-06 DIAGNOSIS — K3184 Gastroparesis: Secondary | ICD-10-CM | POA: Diagnosis present

## 2021-05-06 DIAGNOSIS — E1121 Type 2 diabetes mellitus with diabetic nephropathy: Secondary | ICD-10-CM | POA: Diagnosis present

## 2021-05-06 DIAGNOSIS — Z992 Dependence on renal dialysis: Secondary | ICD-10-CM | POA: Diagnosis not present

## 2021-05-06 DIAGNOSIS — F32A Depression, unspecified: Secondary | ICD-10-CM | POA: Diagnosis present

## 2021-05-06 DIAGNOSIS — Z833 Family history of diabetes mellitus: Secondary | ICD-10-CM

## 2021-05-06 DIAGNOSIS — E876 Hypokalemia: Secondary | ICD-10-CM | POA: Diagnosis present

## 2021-05-06 DIAGNOSIS — E1122 Type 2 diabetes mellitus with diabetic chronic kidney disease: Secondary | ICD-10-CM | POA: Diagnosis present

## 2021-05-06 DIAGNOSIS — Z8616 Personal history of COVID-19: Secondary | ICD-10-CM

## 2021-05-06 DIAGNOSIS — R52 Pain, unspecified: Secondary | ICD-10-CM

## 2021-05-06 HISTORY — DX: Pneumonia, unspecified organism: J18.9

## 2021-05-06 LAB — CBC
HCT: 35.9 % — ABNORMAL LOW (ref 39.0–52.0)
Hemoglobin: 11.2 g/dL — ABNORMAL LOW (ref 13.0–17.0)
MCH: 28.6 pg (ref 26.0–34.0)
MCHC: 31.2 g/dL (ref 30.0–36.0)
MCV: 91.6 fL (ref 80.0–100.0)
Platelets: 368 10*3/uL (ref 150–400)
RBC: 3.92 MIL/uL — ABNORMAL LOW (ref 4.22–5.81)
RDW: 17.6 % — ABNORMAL HIGH (ref 11.5–15.5)
WBC: 13 10*3/uL — ABNORMAL HIGH (ref 4.0–10.5)
nRBC: 0 % (ref 0.0–0.2)

## 2021-05-06 LAB — URINALYSIS, ROUTINE W REFLEX MICROSCOPIC
Bilirubin Urine: NEGATIVE
Glucose, UA: NEGATIVE mg/dL
Hgb urine dipstick: NEGATIVE
Ketones, ur: NEGATIVE mg/dL
Leukocytes,Ua: NEGATIVE
Nitrite: NEGATIVE
Protein, ur: 100 mg/dL — AB
Specific Gravity, Urine: 1.014 (ref 1.005–1.030)
pH: 8 (ref 5.0–8.0)

## 2021-05-06 LAB — COMPREHENSIVE METABOLIC PANEL
ALT: 13 U/L (ref 0–44)
AST: 17 U/L (ref 15–41)
Albumin: 3.1 g/dL — ABNORMAL LOW (ref 3.5–5.0)
Alkaline Phosphatase: 98 U/L (ref 38–126)
Anion gap: 12 (ref 5–15)
BUN: 26 mg/dL — ABNORMAL HIGH (ref 6–20)
CO2: 28 mmol/L (ref 22–32)
Calcium: 9.4 mg/dL (ref 8.9–10.3)
Chloride: 96 mmol/L — ABNORMAL LOW (ref 98–111)
Creatinine, Ser: 5.08 mg/dL — ABNORMAL HIGH (ref 0.61–1.24)
GFR, Estimated: 13 mL/min — ABNORMAL LOW (ref >60)
Glucose, Bld: 113 mg/dL — ABNORMAL HIGH (ref 70–99)
Potassium: 3.9 mmol/L (ref 3.5–5.1)
Sodium: 136 mmol/L (ref 135–145)
Total Bilirubin: 0.3 mg/dL (ref 0.3–1.2)
Total Protein: 7.1 g/dL (ref 6.5–8.1)

## 2021-05-06 LAB — TROPONIN I (HIGH SENSITIVITY)
Troponin I (High Sensitivity): 12 ng/L (ref <18)
Troponin I (High Sensitivity): 12 ng/L (ref <18)

## 2021-05-06 LAB — LIPASE, BLOOD: Lipase: 26 U/L (ref 11–51)

## 2021-05-06 LAB — RESP PANEL BY RT-PCR (FLU A&B, COVID) ARPGX2
Influenza A by PCR: NEGATIVE
Influenza B by PCR: NEGATIVE
SARS Coronavirus 2 by RT PCR: NEGATIVE

## 2021-05-06 LAB — LACTATE DEHYDROGENASE: LDH: 237 U/L — ABNORMAL HIGH (ref 98–192)

## 2021-05-06 LAB — STREP PNEUMONIAE URINARY ANTIGEN: Strep Pneumo Urinary Antigen: NEGATIVE

## 2021-05-06 MED ORDER — POLYVINYL ALCOHOL 1.4 % OP SOLN
1.0000 [drp] | Freq: Three times a day (TID) | OPHTHALMIC | Status: DC
  Administered 2021-05-06 – 2021-05-10 (×9): 1 [drp] via OPHTHALMIC
  Filled 2021-05-06: qty 15

## 2021-05-06 MED ORDER — HEPARIN SODIUM (PORCINE) 5000 UNIT/ML IJ SOLN
5000.0000 [IU] | Freq: Three times a day (TID) | INTRAMUSCULAR | Status: DC
  Administered 2021-05-07 – 2021-05-09 (×6): 5000 [IU] via SUBCUTANEOUS
  Filled 2021-05-06 (×6): qty 1

## 2021-05-06 MED ORDER — PREGABALIN 25 MG PO CAPS
25.0000 mg | ORAL_CAPSULE | Freq: Two times a day (BID) | ORAL | Status: DC
  Administered 2021-05-06 – 2021-05-10 (×8): 25 mg via ORAL
  Filled 2021-05-06 (×8): qty 1

## 2021-05-06 MED ORDER — BRIMONIDINE TARTRATE 0.2 % OP SOLN
1.0000 [drp] | Freq: Two times a day (BID) | OPHTHALMIC | Status: DC
  Administered 2021-05-06 – 2021-05-10 (×8): 1 [drp] via OPHTHALMIC
  Filled 2021-05-06: qty 5

## 2021-05-06 MED ORDER — VANCOMYCIN HCL 1250 MG/250ML IV SOLN
1250.0000 mg | Freq: Once | INTRAVENOUS | Status: AC
  Administered 2021-05-06: 1250 mg via INTRAVENOUS
  Filled 2021-05-06: qty 250

## 2021-05-06 MED ORDER — DORZOLAMIDE HCL-TIMOLOL MAL 2-0.5 % OP SOLN
1.0000 [drp] | Freq: Two times a day (BID) | OPHTHALMIC | Status: DC
  Administered 2021-05-06 – 2021-05-10 (×8): 1 [drp] via OPHTHALMIC
  Filled 2021-05-06 (×2): qty 10

## 2021-05-06 MED ORDER — DIPHENHYDRAMINE HCL 25 MG PO CAPS
25.0000 mg | ORAL_CAPSULE | Freq: Once | ORAL | Status: AC | PRN
  Administered 2021-05-08: 25 mg via ORAL
  Filled 2021-05-06 (×3): qty 1

## 2021-05-06 MED ORDER — GUAIFENESIN 100 MG/5ML PO LIQD
100.0000 mg | ORAL | Status: DC | PRN
  Filled 2021-05-06: qty 5

## 2021-05-06 MED ORDER — CALCIUM ACETATE (PHOS BINDER) 667 MG PO CAPS
667.0000 mg | ORAL_CAPSULE | Freq: Three times a day (TID) | ORAL | Status: DC
  Administered 2021-05-07 – 2021-05-10 (×7): 667 mg via ORAL
  Filled 2021-05-06 (×6): qty 1

## 2021-05-06 MED ORDER — LOSARTAN POTASSIUM 50 MG PO TABS
100.0000 mg | ORAL_TABLET | Freq: Every day | ORAL | Status: DC
  Administered 2021-05-07 – 2021-05-10 (×4): 100 mg via ORAL
  Filled 2021-05-06 (×4): qty 2

## 2021-05-06 MED ORDER — OLOPATADINE HCL 0.1 % OP SOLN
2.0000 [drp] | Freq: Every day | OPHTHALMIC | Status: DC
  Administered 2021-05-07 – 2021-05-10 (×4): 2 [drp] via OPHTHALMIC
  Filled 2021-05-06: qty 5

## 2021-05-06 MED ORDER — ATORVASTATIN CALCIUM 40 MG PO TABS
40.0000 mg | ORAL_TABLET | Freq: Every day | ORAL | Status: DC
  Administered 2021-05-07 – 2021-05-10 (×4): 40 mg via ORAL
  Filled 2021-05-06 (×4): qty 1

## 2021-05-06 MED ORDER — SODIUM CHLORIDE 0.9 % IV SOLN
1.0000 g | Freq: Once | INTRAVENOUS | Status: AC
  Administered 2021-05-06: 1 g via INTRAVENOUS
  Filled 2021-05-06: qty 1

## 2021-05-06 MED ORDER — HYDRALAZINE HCL 25 MG PO TABS
75.0000 mg | ORAL_TABLET | Freq: Three times a day (TID) | ORAL | Status: DC
  Administered 2021-05-06 – 2021-05-07 (×2): 75 mg via ORAL
  Filled 2021-05-06: qty 3
  Filled 2021-05-06: qty 1

## 2021-05-06 MED ORDER — ACETAMINOPHEN 325 MG PO TABS
650.0000 mg | ORAL_TABLET | Freq: Four times a day (QID) | ORAL | Status: DC | PRN
  Administered 2021-05-09: 650 mg via ORAL
  Filled 2021-05-06: qty 2

## 2021-05-06 MED ORDER — OXYCODONE HCL 5 MG PO TABS
5.0000 mg | ORAL_TABLET | Freq: Four times a day (QID) | ORAL | Status: AC | PRN
  Administered 2021-05-06 – 2021-05-07 (×3): 5 mg via ORAL
  Filled 2021-05-06 (×3): qty 1

## 2021-05-06 MED ORDER — SENNA 8.6 MG PO TABS
2.0000 | ORAL_TABLET | Freq: Every day | ORAL | Status: DC
  Administered 2021-05-06 – 2021-05-08 (×3): 17.2 mg via ORAL
  Filled 2021-05-06 (×4): qty 2

## 2021-05-06 MED ORDER — LATANOPROST 0.005 % OP SOLN
1.0000 [drp] | Freq: Every day | OPHTHALMIC | Status: DC
  Administered 2021-05-06 – 2021-05-09 (×4): 1 [drp] via OPHTHALMIC
  Filled 2021-05-06 (×2): qty 2.5

## 2021-05-06 MED ORDER — SODIUM CHLORIDE 0.9 % IV SOLN
500.0000 mg | Freq: Every day | INTRAVENOUS | Status: DC
  Administered 2021-05-06 – 2021-05-08 (×3): 500 mg via INTRAVENOUS
  Filled 2021-05-06 (×3): qty 500

## 2021-05-06 MED ORDER — PREDNISOLONE ACETATE 1 % OP SUSP
1.0000 [drp] | Freq: Three times a day (TID) | OPHTHALMIC | Status: DC
  Administered 2021-05-06 – 2021-05-10 (×11): 1 [drp] via OPHTHALMIC
  Filled 2021-05-06 (×2): qty 5

## 2021-05-06 MED ORDER — SERTRALINE HCL 50 MG PO TABS
25.0000 mg | ORAL_TABLET | Freq: Every day | ORAL | Status: DC
  Administered 2021-05-07 – 2021-05-10 (×4): 25 mg via ORAL
  Filled 2021-05-06 (×4): qty 1

## 2021-05-06 MED ORDER — ATROPINE SULFATE 1 % OP SOLN
1.0000 [drp] | Freq: Two times a day (BID) | OPHTHALMIC | Status: DC
  Administered 2021-05-06 – 2021-05-10 (×8): 1 [drp] via OPHTHALMIC
  Filled 2021-05-06: qty 2

## 2021-05-06 MED ORDER — OXYCODONE-ACETAMINOPHEN 5-325 MG PO TABS
1.0000 | ORAL_TABLET | Freq: Once | ORAL | Status: AC
  Administered 2021-05-06: 1 via ORAL
  Filled 2021-05-06: qty 1

## 2021-05-06 MED ORDER — SODIUM CHLORIDE 0.9% FLUSH
3.0000 mL | Freq: Two times a day (BID) | INTRAVENOUS | Status: DC
  Administered 2021-05-06 – 2021-05-10 (×8): 3 mL via INTRAVENOUS

## 2021-05-06 MED ORDER — IOPAMIDOL (ISOVUE-300) INJECTION 61%
75.0000 mL | Freq: Once | INTRAVENOUS | Status: AC | PRN
  Administered 2021-05-06: 75 mL via INTRAVENOUS

## 2021-05-06 MED ORDER — AMLODIPINE BESYLATE 5 MG PO TABS
10.0000 mg | ORAL_TABLET | Freq: Every day | ORAL | Status: DC
  Administered 2021-05-07: 10 mg via ORAL
  Filled 2021-05-06: qty 2

## 2021-05-06 MED ORDER — MELATONIN 5 MG PO TABS
10.0000 mg | ORAL_TABLET | Freq: Every day | ORAL | Status: DC
  Administered 2021-05-06 – 2021-05-09 (×4): 10 mg via ORAL
  Filled 2021-05-06 (×4): qty 2

## 2021-05-06 MED ORDER — ACETAMINOPHEN 650 MG RE SUPP: 650.0000 mg | Freq: Four times a day (QID) | RECTAL | Status: DC | PRN

## 2021-05-06 MED ORDER — SODIUM CHLORIDE 0.9 % IV SOLN
2.0000 g | INTRAVENOUS | Status: DC
  Administered 2021-05-07 – 2021-05-08 (×2): 2 g via INTRAVENOUS
  Filled 2021-05-06 (×2): qty 20

## 2021-05-06 MED ORDER — ALBUTEROL SULFATE (2.5 MG/3ML) 0.083% IN NEBU: 2.5000 mg | INHALATION_SOLUTION | RESPIRATORY_TRACT | Status: DC | PRN

## 2021-05-06 MED ORDER — TORSEMIDE 20 MG PO TABS
80.0000 mg | ORAL_TABLET | Freq: Two times a day (BID) | ORAL | Status: DC
  Administered 2021-05-07 – 2021-05-10 (×7): 80 mg via ORAL
  Filled 2021-05-06 (×6): qty 4

## 2021-05-06 MED ORDER — VANCOMYCIN VARIABLE DOSE PER UNSTABLE RENAL FUNCTION (PHARMACIST DOSING): Status: DC

## 2021-05-06 MED ORDER — ASPIRIN EC 81 MG PO TBEC
81.0000 mg | DELAYED_RELEASE_TABLET | Freq: Every day | ORAL | Status: DC
  Administered 2021-05-07 – 2021-05-10 (×4): 81 mg via ORAL
  Filled 2021-05-06 (×4): qty 1

## 2021-05-06 NOTE — ED Provider Notes (Signed)
Pt seen in conjunction with E Conklin, PA-C. Please see previous notes for further history.   In brief, patient presented for evaluation of generalized weakness.  He states symptoms began yesterday, but worsened today.  He has been unable to walk due to feeling weak today.  Additionally, he reports left arm pain.  This is been going on since yesterday, is worse with movement and palpation.  He is not taking anything for it.  Patient had a thoracentesis on the left side 2 days ago, denies feeling poorly after the procedure.  Denies shortness of breath, cough, fever.  Additionally, he has developed bruising across his lower abdomen and yesterday, and has diffuse abdominal pain.  He is not on any blood thinners.  He is an ESRD patient, goes Monday, Wednesday, Friday.  Last session was yesterday, was normal for him.  Cxr with pleural effusion. Ct chest shows multifocal PNA, ct abd pelvis negative for acute findings. Will start abx and plan on admission.      Franchot Heidelberg, PA-C 05/06/21 2116    Teressa Lower, MD 05/06/21 [phone]

## 2021-05-06 NOTE — ED Provider Notes (Signed)
[facility]ERGENC[facility] DEPARTMENT Provider Note   CSN: [id number] Arrival date & time:        History Chief Complaint  Patient presents with   Weakness    Christopher Davila is a 50 y.o. male.   Weakness Associated symptoms: abdominal pain   Associated symptoms: no diarrhea, no fever, no headaches, no shortness of breath and no vomiting      Is a 50 year old male with history significant for ESRD on dialysis for at least 20 years, CHF, DM 2, and previous history of pancreatitis who presents today for evaluation of an new onset weakness and epigastric pain.  Patient has dialysis MWF, and has a home oxygen requirement 3 to 4 L by nasal cannula.  Patient also recently had thoracentesis done on 05/03/2021 for left-sided pleural effusion.  For his symptoms today, he states that 2 days ago he noticed that he felt weaker, and today he felt like he could not lift his arms up or his legs.  Felt like his legs were giving out on him yesterday.  Denies numbness or tingling.  He also complains of left arm pain with no known trauma or injury.  Pain is constant, worse with palpation.  No associated numbness.  Patient also complains of bruising across lower abdomen that started 2 days ago as well along with epigastric pain.  Patient is making urine, denies urinary symptoms.  Patient denies vomiting diarrhea.  No alleviating or aggravating factors.  Patient is not on a blood thinner. Past Medical History:  Diagnosis Date   Anemia    Blindness 09/04/2019   CHF (congestive heart failure) (HCC)    Diabetes mellitus    Diarrhea 10/14/2019   Epigastric abdominal pain    ESRD (end stage renal disease) ([place])    Gastroparesis due to DM ([place])    Hypertension    Hypoalbuminemia 04/17/2019   Hypoxia    Neuropathy of lower extremity    Oral candidiasis 06/26/2012   Pancreatitis 06/26/2012   Weight loss 06/26/2012    Patient Active Problem List   Diagnosis Date Noted   Shortness of breath     COVID-19    Hypoxia    End-stage renal disease on hemodialysis ([place])    Hypervolemia    History of thoracentesis    S/P thoracentesis    Acute respiratory failure with hypoxia ([place]) 03/26/2021   Pressure injury of skin 11/06/2019   ESRD on dialysis [facility])    Heart failure with preserved ejection fraction ([place])    Goals of care, counseling/discussion    Palliative care encounter    Diarrhea 94/85/4627   Metabolic acidosis, increased anion gap 10/13/2019   Acute urinary retention 10/13/2019   Weakness 10/05/2019   Acute on chronic diastolic CHF (congestive heart failure) ([place]) 09/27/2019   Vision loss of left eye 09/21/2019   Acute kidney injury superimposed on chronic kidney disease ([place]) 09/19/2019   Hyponatremia 09/19/2019   Homeless 09/19/2019   Anemia of chronic disease 09/19/2019   Vision loss, left eye 09/19/2019   Acute loss of vision, right 06/16/2019   Hypertensive urgency 04/17/2019   Hypokalemia 04/17/2019   Normocytic anemia 04/17/2019   Hyperglycemia    CKD (chronic kidney disease) stage 4, GFR 15-29 ml/min (HCC)    Alcohol use    Gastroparesis due to DM [facility])    Essential hypertension    Hyperlipidemia    Type 2 diabetes mellitus with hypoglycemia ([place]) 06/26/2012    Past Surgical History:  Procedure Laterality  Date   AV FISTULA PLACEMENT Right 10/29/2019   Procedure: RIGHT ARM BRACHIOBASILIC ARTERIOVENOUS (AV) FISTULA CREATION;  Surgeon: Rosetta Posner, MD;  Location: [facility] OR;  Service: Vascular;  Laterality: Right;   [place] Right 01/05/2020   Procedure: RIGHT ARM SECOND STAGE [place];  Surgeon: Rosetta Posner, MD;  Location: [facility] OR;  Service: Vascular;  Laterality: Right;   EYE SURGERY     IR FLUORO GUIDE CV LINE RIGHT  10/28/2019   IR US GUIDE VASC ACCESS RIGHT  10/28/2019       Family History  Problem Relation Age of Onset   Diabetes Mother    Lung disease Mother    Hypertension Mother    Diabetes Maternal Aunt     CAD Maternal Aunt    CAD Cousin     Social History   Tobacco Use   Smoking status: Former    Packs/day: 0.00    Types: Cigarettes    Quit date: 03/22/2015    Years since quitting: 6.1   Smokeless tobacco: Never  Vaping Use   Vaping Use: Never used  Substance Use Topics   Alcohol use: Not Currently   Drug use: No    Home Medications Prior to Admission medications   Medication Sig Start Date End Date Taking? Authorizing Provider  albuterol (VENTOLIN HFA) 108 (90 Base) MCG/ACT inhaler Inhale 2 puffs into the lungs every 4 (four) hours as needed for shortness of breath.    [provider]  Amino Acids-Protein Hydrolys (FEEDING SUPPLEMENT, PRO-STAT SUGAR FREE 64,) LIQD Take 30 mLs by mouth 2 (two) times daily.    [provider]  amLODipine (NORVASC) 10 MG tablet Take 1 tablet (10 mg total) by mouth daily. 11/19/19   Charlynne Cousins, MD  aspirin EC 81 MG tablet Take 1 tablet (81 mg total) by mouth daily. 05/24/15   Ghimire, Henreitta Leber, MD  atorvastatin (LIPITOR) 40 MG tablet Take 1 tablet (40 mg total) by mouth daily. 12/05/20   [place], Peter M, MD  atropine 1 % ophthalmic solution Place 1 drop into both eyes in the morning and at bedtime. 04/24/21   [provider]  brimonidine (ALPHAGAN P) 0.1 % SOLN Place 1 drop into the left eye in the morning and at bedtime.    [provider]  calcium acetate (PHOSLO) 667 MG capsule Take 1 capsule (667 mg total) by mouth 3 (three) times daily with meals. 11/19/19   Charlynne Cousins, MD  cefTRIAXone (ROCEPHIN) 1 g injection SMARTSIG:1 Gram(s) IM Daily PRN 04/24/21   [provider]  Dextran 70-Hypromellose (TEARS PURE) 0.1-0.3 % SOLN Place 1 drop into both eyes in the morning, at noon, in the evening, and at bedtime.    [provider]  diphenhydrAMINE (BENADRYL) 25 MG tablet Take 25 mg by mouth every 8 (eight) hours as needed for itching.    [provider]  dorzolamide-timolol  (COSOPT) 22.3-6.8 MG/ML ophthalmic solution Place 1 drop into the left eye 2 (two) times daily. 09/23/19   Geradine Girt, DO  ferrous sulfate 325 (65 FE) MG tablet Take 325 mg by mouth daily. 08/25/20   [provider]  guaiFENesin (ROBITUSSIN) 100 MG/5ML liquid Take 100 mg by mouth every 4 (four) hours as needed for cough.    [provider]  hydrALAZINE (APRESOLINE) 50 MG tablet Take 1.5 tablets (75 mg total) by mouth 3 (three) times daily. 04/26/21 04/21/22  Loel Dubonnet, NP  ibuprofen (ADVIL)  800 MG tablet Take 800 mg by mouth every 12 (twelve) hours as needed for mild pain.    [provider]  latanoprost (XALATAN) 0.005 % ophthalmic solution Place 1 drop into the left eye at bedtime. 09/23/19   Geradine Girt, DO  losartan (COZAAR) 100 MG tablet Take 1 tablet (100 mg total) by mouth daily. 11/19/19   Charlynne Cousins, MD  Melatonin 10 MG CAPS Take 10 mg by mouth at bedtime.    [provider]  Menthol, Topical Analgesic, 5 % GEL Apply 1 application topically every 12 (twelve) hours as needed (bilateral shoulder pain).    [provider]  olopatadine (PATANOL) 0.1 % ophthalmic solution Place 2 drops into both eyes daily.    [provider]  ondansetron (ZOFRAN) 4 MG tablet Take 4 mg by mouth every 6 (six) hours as needed for nausea or vomiting.    [provider]  oxyCODONE-acetaminophen (PERCOCET/ROXICET) 5-325 MG tablet Take 1 tablet by mouth See admin instructions. 1 tablet once daily every Tuesday, Thursday, Saturday, and Sunday. May take an additional 1 tablet every 6 hours as needed on Monday's Wednesday's, and Friday's. 04/10/21   Darliss Cheney, MD  OXYGEN Inhale 3 L into the lungs continuous.    [provider]  polyethylene glycol (MIRALAX / GLYCOLAX) 17 g packet Take 17 g by mouth daily.    [provider]  prednisoLONE acetate (PRED FORTE) 1 % ophthalmic suspension Place 1 drop into both eyes 4 (four)  times daily.    [provider]  pregabalin (LYRICA) 25 MG capsule Take 25 mg by mouth 2 (two) times daily. 08/25/20   [provider]  senna (SENOKOT) 8.6 MG TABS tablet Take 2 tablets by mouth at bedtime.    [provider]  sertraline (ZOLOFT) 25 MG tablet Take 25 mg by mouth daily. On Monday's, Wednesday's, and Friday's, take after dialysis. All other days take in the morning.    [provider]  torsemide (DEMADEX) 20 MG tablet Take 80 mg by mouth 2 (two) times daily.    [provider]  Vitamin D, Ergocalciferol, (DRISDOL) 1.25 MG (50000 UNIT) CAPS capsule Take 50,000 Units by mouth every 7 (seven) days. Sunday's    [provider]    Allergies    Morphine and related  Review of Systems   Review of Systems  Constitutional:  Negative for fever.  HENT: Negative.    Eyes: Negative.   Respiratory:  Negative for shortness of breath.   Cardiovascular: Negative.   Gastrointestinal:  Positive for abdominal pain. Negative for diarrhea and vomiting.  Endocrine: Negative.   Genitourinary: Negative.   Musculoskeletal: Negative.   Skin:  Negative for rash.  Neurological:  Positive for weakness. Negative for numbness and headaches.  All other systems reviewed and are negative.  Physical Exam Updated Vital Signs BP (!) 142/80 (BP Location: Left Arm)   Pulse 93   Temp 98.5 F (36.9 C) (Oral)   Resp 18   Ht 5\' 3"  (1.6 m)   Wt 59 kg   SpO2 100%   BMI 23.03 kg/m   Physical Exam Vitals and nursing note reviewed.  Constitutional:      General: He is not in acute distress.    Appearance: He is ill-appearing.     Comments: Patient tired on interview, easily aroused.  A&O x3  HENT:     Head: Atraumatic.  Eyes:     Conjunctiva/sclera: Conjunctivae normal.  Cardiovascular:  Rate and Rhythm: Normal rate and regular rhythm.     Pulses: Normal pulses.          Radial pulses are 2+ on the right side and 2+ on the left side.        Dorsalis pedis pulses are 2+ on the right side and 2+ on the left side.     Heart sounds: No murmur heard. Pulmonary:     Effort: Pulmonary effort is normal. No respiratory distress.     Breath sounds: Normal breath sounds.  Abdominal:     General: Abdomen is flat. There is no distension.     Palpations: Abdomen is soft.     Tenderness: There is abdominal tenderness.     Comments: Bruising along the left lower and right lower quadrant, worse on the left side.  Patient is diffusely tender on palpation throughout the abdomen, but reports worse pain in epigastric region.  He is guarding, wincing with light touches to the abdomen.  Musculoskeletal:        General: Normal range of motion.     Cervical back: Normal range of motion.     Comments: Left shoulder without deformities.  Pain to palpation in the left shoulder joint.  No crepitus along the left upper arm  Skin:    General: Skin is warm and dry.     Capillary Refill: Capillary refill takes less than 2 seconds.  Neurological:     General: No focal deficit present.     Comments: 3/5 grip strength symmetric bilaterally.  3/5 strength in dorsi and plantar flexion.  0/5 strength on hip flexion.  Unable to hold leg in the air on his own.  Sensation intact in the distal extremities.  Psychiatric:        Mood and Affect: Mood normal.    ED Results / Procedures / Treatments   Labs (all labs ordered are listed, but only abnormal results are displayed) Labs Reviewed  RESP PANEL BY RT-PCR (FLU A&B, COVID) ARPGX2  CBC  COMPREHENSIVE METABOLIC PANEL  LIPASE, BLOOD  TROPONIN I (HIGH SENSITIVITY)    EKG None  Radiology No results found.  Procedures Procedures   Medications Ordered in ED Medications  oxyCODONE-acetaminophen (PERCOCET/ROXICET) 5-325 MG per tablet 1 tablet (has no administration in time range)    ED Course  I have reviewed the triage vital signs and the nursing notes.  Pertinent labs & imaging results that were  available during my care of the patient were reviewed by me and considered in my medical decision making (see chart for details).    MDM Rules/Calculators/A&P                         This is a 50year old male who presents to ED for evaluation of upper and lower extremity weakness and epigastric pain for two days.  Physical exam significant for decreased bilateral grip strength, decreased bilateral leg strength.  Patient is afebrile, does not appear septic.  He is quite tired during interview, however he is alert and a good historian.  Abdomen is diffusely tender with bruising suggestive of retroperitoneal bleeding.  Will proceed with labs, chest xray and ct abdomen pelvis   Labs ordered in ED: CMP: Unremarkable, at patient's baseline CBC: WBC elevated at 13, up from 5.6 nine days ago. UA: Pending results  Lipase was normal, LDH elevated at 237, troponins normal   Imaging ordered in ED: Chest x-ray with perihilar and lower lung  interstitial infiltrate that is worse on the left.  Possibly indicative of infection. CT of the chest with patchy consolidation consistent with multifocal pneumonia.  No acute abnormalities of the abdomen and pelvis.  All labs and imaging were independently interpreted by myself, Kathe Becton, PA-C.  Given patient's imaging concerning for multifocal pneumonia in the setting of elevated LDH and white blood cell count, believe patient is not a good candidate for discharge at this time.  Spoke with Dr. Mitzi Hansen the hospitalist team who agrees to admit.  Ultimate treatment and disposition will be determined by him.  Final Clinical Impression(s) / ED Diagnoses Final diagnoses:  Multifocal pneumonia    Rx / DC Orders ED Discharge Orders     None        Rodena Piety 05/06/21 [id number]    Teressa Lower, MD 05/06/21 2352

## 2021-05-06 NOTE — ED Notes (Signed)
Pt states he feels like he can't get his thoughts together, that when he was talking on the phone he was having a hard time trying to tell him what was going on. Pt a/ox4, able to answer all questions without difficulty and has noted weakness in all extremities. MD notified, waiting for further orders. Asked pt if this is a new occurrence or has been this way since arrival and he states it has been like this since Saturday, sleeping the entire day away with staff having difficulty waking him. No acute changes noted. Will continue to monitor.

## 2021-05-06 NOTE — ED Notes (Signed)
ED Provider at bedside. 

## 2021-05-06 NOTE — ED Triage Notes (Signed)
Pt BIB [facility] from [facility]. Pt having decreased energy for past 2-3 days. Pt had a thoracentesis on Thursday.  Bruising to the lower abd that developed the following day. Denies any fevers.

## 2021-05-06 NOTE — H&P (Addendum)
History and Physical    Christopher Davila GUR:[id number] DOB: 09/11/1970 DOA: 05/06/2021  PCP: [name], MD   Patient coming from: SNF  Chief Complaint: General weakness, fatigue   HPI: Christopher Davila is a pleasant 50 y.o. male with medical history significant for ESRD on hemodialysis, blindness, HFpEF, hypertension, supplemental oxygen requirement since admission with COVID last month, recent left thoracentesis, and recent treatment for bacterial pneumonia, now presenting to the emergency department with general weakness and fatigue.  Patient complains of progressively worsening fatigue and general weakness over approximately 3 days, no longer to ambulate with his walker due to this.  There is no headache or focal numbness or weakness.  He has had some more exertional dyspnea recently but his cough not significantly changed.  He had been treated with 1 g IM Rocephin and Levaquin at his SNF which he completed approximately 1 week ago.  He underwent left thoracentesis on 05/03/2021, not exudative by Light's criteria.  He denies fevers or chills.  ED Course: Upon arrival to the ED, patient is found to be afebrile, saturating upper 90s on 3 L/min of supplemental oxygen, mildly tachypneic, and with stable blood pressure.  EKG features sinus rhythm with RAD.  CT chest with patchy bilateral consolidation consistent with multifocal pneumonia, as well as bilateral pleural effusion.  CT of the abdomen and pelvis is negative for acute abnormality.  Chemistry panel with BUN 26, normal bicarbonate, and normal potassium.  CBC with leukocytosis to 13,000 and mild normocytic anemia.  COVID and influenza were negative.  Troponin was normal x2.  Patient was treated with Percocet, vancomycin, and cefepime in the ED.  Review of Systems:  All other systems reviewed and apart from HPI, are negative.  Past Medical History:  Diagnosis Date   Anemia    Blindness 09/04/2019   CHF (congestive heart failure) (HCC)     Diabetes mellitus    Diarrhea 10/14/2019   Epigastric abdominal pain    ESRD (end stage renal disease) ([place])    Gastroparesis due to DM ([place])    Hypertension    Hypoalbuminemia 04/17/2019   Hypoxia    Neuropathy of lower extremity    Oral candidiasis 06/26/2012   Pancreatitis 06/26/2012   Weight loss 06/26/2012    Past Surgical History:  Procedure Laterality Date   AV FISTULA PLACEMENT Right 10/29/2019   Procedure: RIGHT ARM BRACHIOBASILIC ARTERIOVENOUS (AV) FISTULA CREATION;  Surgeon: Rosetta Posner, MD;  Location: [facility];  Service: Vascular;  Laterality: Right;   [place] Right 01/05/2020   Procedure: RIGHT ARM SECOND STAGE [place];  Surgeon: Rosetta Posner, MD;  Location: [facility];  Service: Vascular;  Laterality: Right;   EYE SURGERY     IR FLUORO GUIDE CV LINE RIGHT  10/28/2019   IR US GUIDE VASC ACCESS RIGHT  10/28/2019    Social History:   reports that he quit smoking about 6 years ago. His smoking use included cigarettes. He has never used smokeless tobacco. He reports that he does not currently use alcohol. He reports that he does not use drugs.  Allergies  Allergen Reactions   Morphine And Related Itching and Other (See Comments)    Pt prefers not to be given this drug    Family History  Problem Relation Age of Onset   Diabetes Mother    Lung disease Mother    Hypertension Mother    Diabetes Maternal Aunt    CAD Maternal Aunt    CAD Cousin  Prior to Admission medications   Medication Sig Start Date End Date Taking? Authorizing Provider  albuterol (VENTOLIN HFA) 108 (90 Base) MCG/ACT inhaler Inhale 2 puffs into the lungs every 4 (four) hours as needed for shortness of breath.   Yes [provider]  amLODipine (NORVASC) 10 MG tablet Take 1 tablet (10 mg total) by mouth daily. 11/19/19  Yes Charlynne Cousins, MD  aspirin EC 81 MG tablet Take 1 tablet (81 mg total) by mouth daily. 05/24/15  Yes Ghimire, Henreitta Leber, MD   atorvastatin (LIPITOR) 40 MG tablet Take 1 tablet (40 mg total) by mouth daily. 12/05/20  Yes [place], Peter M, MD  atropine 1 % ophthalmic solution Place 1 drop into both eyes in the morning and at bedtime. 04/24/21  Yes [provider]  brimonidine (ALPHAGAN P) 0.1 % SOLN Place 1 drop into the left eye in the morning and at bedtime.   Yes [provider]  calcium acetate (PHOSLO) 667 MG capsule Take 1 capsule (667 mg total) by mouth 3 (three) times daily with meals. 11/19/19  Yes Charlynne Cousins, MD  Dextran 70-Hypromellose (TEARS PURE) 0.1-0.3 % SOLN Place 1 drop into both eyes in the morning, at noon, in the evening, and at bedtime.   Yes [provider]  diphenhydrAMINE (BENADRYL) 25 MG tablet Take 25 mg by mouth every 8 (eight) hours as needed for itching.   Yes [provider]  dorzolamide-timolol (COSOPT) 22.3-6.8 MG/ML ophthalmic solution Place 1 drop into the left eye 2 (two) times daily. 09/23/19  Yes Vann, Jessica U, DO  ferrous sulfate 325 (65 FE) MG tablet Take 325 mg by mouth daily. 08/25/20  Yes [provider]  guaiFENesin (ROBITUSSIN) 100 MG/5ML liquid Take 100 mg by mouth every 4 (four) hours as needed for cough.   Yes [provider]  hydrALAZINE (APRESOLINE) 50 MG tablet Take 1.5 tablets (75 mg total) by mouth 3 (three) times daily. 04/26/21 04/21/22 Yes Loel Dubonnet, NP  ibuprofen (ADVIL) 800 MG tablet Take 800 mg by mouth every 12 (twelve) hours as needed for mild pain.   Yes [provider]  latanoprost (XALATAN) 0.005 % ophthalmic solution Place 1 drop into the left eye at bedtime. 09/23/19  Yes Vann, Jessica U, DO  losartan (COZAAR) 100 MG tablet Take 1 tablet (100 mg total) by mouth daily. 11/19/19  Yes Charlynne Cousins, MD  Melatonin 10 MG CAPS Take 10 mg by mouth at bedtime.   Yes [provider]  Melatonin 10 MG TABS Take 1 tablet by mouth at bedtime.   Yes [provider]  olopatadine  (PATANOL) 0.1 % ophthalmic solution Place 2 drops into both eyes daily.   Yes [provider]  ondansetron (ZOFRAN) 4 MG tablet Take 4 mg by mouth every 6 (six) hours as needed for nausea or vomiting.   Yes [provider]  oxyCODONE-acetaminophen (PERCOCET/ROXICET) 5-325 MG tablet Take 1 tablet by mouth See admin instructions. 1 tablet once daily every Tuesday, Thursday, Saturday, and Sunday. May take an additional 1 tablet every 6 hours as needed on Monday's Wednesday's, and Friday's. 04/10/21  Yes Pahwani, Einar Grad, MD  polyethylene glycol (MIRALAX / GLYCOLAX) 17 g packet Take 17 g by mouth daily.   Yes [provider]  prednisoLONE acetate (PRED FORTE) 1 % ophthalmic suspension Place 1 drop into both eyes 4 (four) times daily.   Yes [provider]  pregabalin (LYRICA) 25 MG capsule Take 25 mg by mouth  2 (two) times daily. 08/25/20  Yes [provider]  senna (SENOKOT) 8.6 MG TABS tablet Take 2 tablets by mouth at bedtime.   Yes [provider]  sertraline (ZOLOFT) 25 MG tablet Take 25 mg by mouth daily. On Monday's, Wednesday's, and Friday's, take after dialysis. All other days take in the morning.   Yes [provider]  torsemide (DEMADEX) 20 MG tablet Take 80 mg by mouth 2 (two) times daily.   Yes [provider]  Vitamin D, Ergocalciferol, (DRISDOL) 1.25 MG (50000 UNIT) CAPS capsule Take 50,000 Units by mouth every 7 (seven) days. Sunday's   Yes [provider]  OXYGEN Inhale 3 L into the lungs continuous.    [provider]    Physical Exam: Vitals:   05/06/21 2100 05/06/21 2130 05/06/21 2200 05/06/21 2230  BP: (!) 146/87 (!) 144/80 (!) 145/90 135/83  Pulse: 92 87 85 84  Resp: (!) 25 13 13 13   Temp:      TempSrc:      SpO2: 100% 100% 100% 100%  Weight:      Height:        Constitutional: NAD, calm  Eyes: No drainage. Lids normal ENMT: Mucous membranes are moist. Posterior pharynx clear of any  exudate or lesions.   Neck: supple, no masses  Respiratory: Rales bilaterally. Mild tachypnea, speaking full sentences. No accessory muscle use.  Cardiovascular: S1 & S2 heard, regular rate and rhythm. B/l pedal edema.  Abdomen: No distension, no tenderness, soft. Bowel sounds active.  Musculoskeletal: no clubbing / cyanosis. No joint deformity upper and lower extremities.   Skin: ecchymosis over LLQ abdomen. Warm, dry, well-perfused. Neurologic: No facial asymmetry, no aphasia or dysarthria. Sensation to light touch intact. Strength testing limited by poor effort despite encouragement, moving all extremities equally. Alert and oriented.  Psychiatric: Calm. Cooperative.    Labs and Imaging on Admission: I have personally reviewed following labs and imaging studies  CBC: Recent Labs  Lab 05/06/21 1820  WBC 13.0*  HGB 11.2*  HCT 35.9*  MCV 91.6  PLT 409   Basic Metabolic Panel: Recent Labs  Lab 05/06/21 1820  NA 136  K 3.9  CL 96*  CO2 28  GLUCOSE 113*  BUN 26*  CREATININE 5.08*  CALCIUM 9.4   GFR: Estimated Creatinine Clearance: 14.2 mL/min (A) (by C-G formula based on SCr of 5.08 mg/dL (H)). Liver Function Tests: Recent Labs  Lab 05/06/21 1820  AST 17  ALT 13  ALKPHOS 98  BILITOT 0.3  PROT 7.1  ALBUMIN 3.1*   Recent Labs  Lab 05/06/21 1820  LIPASE 26   No results for input(s): AMMONIA in the last 168 hours. Coagulation Profile: No results for input(s): INR, PROTIME in the last 168 hours. Cardiac Enzymes: No results for input(s): CKTOTAL, CKMB, CKMBINDEX, TROPONINI in the last 168 hours. BNP (last 3 results) No results for input(s): PROBNP in the last 8760 hours. HbA1C: No results for input(s): HGBA1C in the last 72 hours. CBG: No results for input(s): GLUCAP in the last 168 hours. Lipid Profile: No results for input(s): CHOL, HDL, LDLCALC, TRIG, CHOLHDL, LDLDIRECT in the last 72 hours. Thyroid Function Tests: No results for input(s): TSH, T4TOTAL,  FREET4, T3FREE, THYROIDAB in the last 72 hours. Anemia Panel: No results for input(s): VITAMINB12, FOLATE, FERRITIN, TIBC, IRON, RETICCTPCT in the last 72 hours. Urine analysis:    Component Value Date/Time   COLORURINE AMBER (A) 10/27/2019 1559   APPEARANCEUR TURBID (A) 10/27/2019 1559  LABSPEC 1.014 10/27/2019 1559   PHURINE 5.0 10/27/2019 1559   GLUCOSEU 50 (A) 10/27/2019 1559   HGBUR LARGE (A) 10/27/2019 1559   BILIRUBINUR NEGATIVE 10/27/2019 1559   KETONESUR NEGATIVE 10/27/2019 1559   PROTEINUR 100 (A) 10/27/2019 1559   UROBILINOGEN 0.2 12/13/2014 2056   NITRITE NEGATIVE 10/27/2019 1559   LEUKOCYTESUR LARGE (A) 10/27/2019 1559   Sepsis Labs: @LABRCNTIP (procalcitonin:4,lacticidven:4) ) Recent Results (from the past 240 hour(s))  Resp Panel by RT-PCR (Flu A&B, Covid) Nasopharyngeal Swab     Status: None   Collection Time: 05/06/21  6:25 PM   Specimen: Nasopharyngeal Swab; Nasopharyngeal(NP) swabs in vial transport medium  Result Value Ref Range Status   SARS Coronavirus 2 by RT PCR NEGATIVE NEGATIVE Final    Comment: (NOTE) SARS-CoV-2 target nucleic acids are NOT DETECTED.  The SARS-CoV-2 RNA is generally detectable in upper respiratory specimens during the acute phase of infection. The lowest concentration of SARS-CoV-2 viral copies this assay can detect is 138 copies/mL. A negative result does not preclude SARS-Cov-2 infection and should not be used as the sole basis for treatment or other patient management decisions. A negative result may occur with  improper specimen collection/handling, submission of specimen other than nasopharyngeal swab, presence of viral mutation(s) within the areas targeted by this assay, and inadequate number of viral copies(<138 copies/mL). A negative result must be combined with clinical observations, patient history, and epidemiological information. The expected result is Negative.  Fact Sheet for Patients:   EntrepreneurPulse.com.au  Fact Sheet for Healthcare Providers:  IncredibleEmployment.be  This test is no t yet approved or cleared by the [place] FDA and  has been authorized for detection and/or diagnosis of SARS-CoV-2 by FDA under an Emergency Use Authorization (EUA). This EUA will remain  in effect (meaning this test can be used) for the duration of the COVID-19 declaration under Section 564(b)(1) of the Act, 21 U.S.C.section 360bbb-3(b)(1), unless the authorization is terminated  or revoked sooner.       Influenza A by PCR NEGATIVE NEGATIVE Final   Influenza B by PCR NEGATIVE NEGATIVE Final    Comment: (NOTE) The Xpert Xpress SARS-CoV-2/FLU/RSV plus assay is intended as an aid in the diagnosis of influenza from Nasopharyngeal swab specimens and should not be used as a sole basis for treatment. Nasal washings and aspirates are unacceptable for Xpert Xpress SARS-CoV-2/FLU/RSV testing.  Fact Sheet for Patients: EntrepreneurPulse.com.au  Fact Sheet for Healthcare Providers: IncredibleEmployment.be  This test is not yet approved or cleared by the [place] FDA and has been authorized for detection and/or diagnosis of SARS-CoV-2 by FDA under an Emergency Use Authorization (EUA). This EUA will remain in effect (meaning this test can be used) for the duration of the COVID-19 declaration under Section 564(b)(1) of the Act, 21 U.S.C. section 360bbb-3(b)(1), unless the authorization is terminated or revoked.  Performed at [facility], [place] [address] [place], [place] [address]      Radiological Exams on Admission: DG Chest 2 View  Result Date: 05/06/2021 CLINICAL DATA:  Status post thoracentesis EXAM: CHEST - 2 VIEW COMPARISON:  05/03/2021 FINDINGS: The lungs are symmetrically well expanded. Small left pleural effusion is present, decreased in size since prior examination and partially  loculated laterally. Small left pleural effusion is unchanged. Mild superimposed bilateral perihilar and lower lung zone interstitial infiltrate persists, asymmetrically more severe on the left, edema versus infection. Cardiac size is mildly enlarged, unchanged. No pneumothorax. IMPRESSION: Interval right thoracentesis. Small residual right pleural effusion, partially laterally  loculated. No pneumothorax. Stable small left pleural effusion. Mild perihilar and lower lung zone interstitial pulmonary infiltrate, asymmetrically more severe on the left, edema versus infection. Electronically Signed   By: Fidela Salisbury M.D.   On: 05/06/2021 19:44   CT CHEST ABDOMEN PELVIS W CONTRAST  Result Date: 05/06/2021 CLINICAL DATA:  Weakness and abdominal pain, initial encounter EXAM: CT CHEST, ABDOMEN, AND PELVIS WITH CONTRAST TECHNIQUE: Multidetector CT imaging of the chest, abdomen and pelvis was performed following the standard protocol during bolus administration of intravenous contrast. CONTRAST:  53mL ISOVUE-300 IOPAMIDOL (ISOVUE-300) INJECTION 61% COMPARISON:  Chest x-ray from earlier in the same day, CT from 03/30/2021. FINDINGS: CT CHEST FINDINGS Cardiovascular: Thoracic aorta demonstrates a normal branching pattern. Mild atherosclerotic calcifications are noted. No aneurysmal dilatation or dissection is noted. Pulmonary artery is within normal limits without evidence of embolus although timing was not performed for embolus evaluation. Mild coronary calcifications are noted. Mediastinum/Nodes: No mediastinal or hilar adenopathy is noted. The esophagus as visualized is within normal limits. The thoracic inlet is unremarkable. Lungs/Pleura: Bilateral pleural effusions are noted right considerably greater than left. Patchy consolidation is noted consistent with multifocal pneumonia within the lower lobes bilaterally. These changes are relatively stable from prior CT from 03/30/2021. No new parenchymal nodule is noted.  The ground-glass attenuation has nearly completely resolved when compared with the prior CT. Musculoskeletal: No acute rib abnormality is noted. No acute bony abnormality is seen. CT ABDOMEN PELVIS FINDINGS Hepatobiliary: Gallbladder is well distended with dependent gallstones. Liver is within normal limits. No obstructive changes are seen. Pancreas: Unremarkable. No pancreatic ductal dilatation or surrounding inflammatory changes. Spleen: Normal in size without focal abnormality. Adrenals/Urinary Tract: Adrenal glands are within normal limits. Kidneys demonstrate a normal enhancement pattern bilaterally. Bladder is well distended. Stomach/Bowel: No obstructive or inflammatory changes of the colon are seen. Appendix is not well visualized. No inflammatory changes to suggest appendicitis are seen. Small bowel and stomach are within normal limits. Vascular/Lymphatic: Aortic atherosclerosis. No enlarged abdominal or pelvic lymph nodes. Reproductive: Prostate is unremarkable. Other: No abdominal wall hernia or abnormality. No abdominopelvic ascites. Musculoskeletal: No acute or significant osseous findings. IMPRESSION: CT of the chest: Patchy consolidation and bilateral effusions right greater than left similar to that seen on prior CT. This is again consistent with multifocal pneumonia. No new focal abnormality is noted. CT of the abdomen and pelvis: Cholelithiasis without complicating factors. No other focal abnormality is seen. Electronically Signed   By: Inez Catalina M.D.   On: 05/06/2021 20:28    EKG: Independently reviewed. Sinus rhythm, RAD.   Assessment/Plan   1. Multifocal PNA; acute hypoxic respiratory failure  - Presents with fatigue and general weakness, has ongoing hypoxic respiratory failure despite recent thoracentesis and antibiotics  - Vancomycin and cefepime started in ED  - Continue vancomycin with Rocephin and azithromycin, check sputum culture, check strep pneumo and legionella antigens,  check/trend procalcitonin   2. General weakness, fatigue  - Pt with baseline debility and fatigue comes from SNF with general weakness and fatigue  - Possibly related to pneumonia, plan to treat with abx as above, check phosphorus, TSH, B12, folate, and serum CK    3. Pleural effusions   - Bilateral pleural effusions noted on CT  - Recently evaluated by pulmonology for this was suspected to be transudate and fluid from left pleural fluid from thora 11/17 was transudative according to Light's criteria  - Volume being managed by HD    4. ESRD - Reports completing  HD on 05/05/21  - No indication for urgent dialysis on admission  - Renally-dose medications, restrict fluids    5. Chronic diastolic CHF - Appears compensated  - EF was preserved on TTE in October 2022  - Continue torsemide, HD, monitor volume status    6. Hypertension  - Continue Norvasc, hydralazine, losartan     DVT prophylaxis: sq heparin  Code Status: Full  Level of Care: Level of care: Telemetry Medical Family Communication: None present  Disposition Plan:  Patient is from: SNF  Anticipated d/c is to: SNF  Anticipated d/c date is: 05/08/21  Patient currently: Pending improvement with antibiotics  Consults called: None  Admission status: Inpatient     Vianne Bulls, MD [facility]  05/06/2021, 11:06 PM

## 2021-05-06 NOTE — ED Notes (Signed)
Patient transported to CT 

## 2021-05-06 NOTE — ED Notes (Signed)
Admitting Provider at bedside. 

## 2021-05-06 NOTE — Progress Notes (Signed)
Pharmacy Antibiotic Note  Christopher Davila is a 50 y.o. male admitted on 05/06/2021 with pneumonia.  Pharmacy has been consulted for vancomycin dosing.  Patient with a history of ESRD MWF, CHF, DM 2, and previous history of pancreatitis . Patient presenting with new onset weakness and epigastric pain.  Recent thoracentesis on 11/17 for lt sided pleural effusion.  Plan: Vancomycin 1250 mg once - subsequent dosing tbd once HD schedule is known Cefepime per MD Trend WBC, Fever, Renal function, & Clinical course F/u cultures, clinical course, WBC, fever De-escalate when able F/u Nephrology plan  Height: 5\' 3"  (160 cm) Weight: 59 kg (130 lb) IBW/kg (Calculated) : 56.9  Temp (24hrs), Avg:98.5 F (36.9 C), Min:98.5 F (36.9 C), Max:98.5 F (36.9 C)  Recent Labs  Lab 05/06/21 1820  WBC 13.0*  CREATININE 5.08*    Estimated Creatinine Clearance: 14.2 mL/min (A) (by C-G formula based on SCr of 5.08 mg/dL (H)).    Allergies  Allergen Reactions   Morphine And Related Itching and Other (See Comments)    Pt prefers not to be given this drug   Antimicrobials this admission: cefepime 11/20 x1 in ED vancomycin 11/20 >>   Microbiology results: Pending  Thank you for allowing pharmacy to be a part of this patient's care.  Lorelei Pont, PharmD, BCPS 05/06/2021 8:45 PM ED Clinical Pharmacist -  [phone]

## 2021-05-07 ENCOUNTER — Inpatient Hospital Stay (HOSPITAL_COMMUNITY): Payer: Medicare Other

## 2021-05-07 DIAGNOSIS — J189 Pneumonia, unspecified organism: Secondary | ICD-10-CM | POA: Diagnosis not present

## 2021-05-07 LAB — TSH: TSH: 2.649 u[IU]/mL (ref 0.350–4.500)

## 2021-05-07 LAB — GLUCOSE, CAPILLARY
Glucose-Capillary: 76 mg/dL (ref 70–99)
Glucose-Capillary: 213 mg/dL — ABNORMAL HIGH (ref 70–99)

## 2021-05-07 LAB — BODY FLUID CULTURE

## 2021-05-07 LAB — CBG MONITORING, ED: Glucose-Capillary: 145 mg/dL — ABNORMAL HIGH (ref 70–99)

## 2021-05-07 LAB — CBC
HCT: 31.1 % — ABNORMAL LOW (ref 39.0–52.0)
Hemoglobin: 9.4 g/dL — ABNORMAL LOW (ref 13.0–17.0)
MCH: 27.6 pg (ref 26.0–34.0)
MCHC: 30.2 g/dL (ref 30.0–36.0)
MCV: 91.2 fL (ref 80.0–100.0)
Platelets: 320 10*3/uL (ref 150–400)
RBC: 3.41 MIL/uL — ABNORMAL LOW (ref 4.22–5.81)
RDW: 17.3 % — ABNORMAL HIGH (ref 11.5–15.5)
WBC: 10.7 10*3/uL — ABNORMAL HIGH (ref 4.0–10.5)
nRBC: 0 % (ref 0.0–0.2)

## 2021-05-07 LAB — BASIC METABOLIC PANEL
Anion gap: 9 (ref 5–15)
BUN: 29 mg/dL — ABNORMAL HIGH (ref 6–20)
CO2: 26 mmol/L (ref 22–32)
Calcium: 8.6 mg/dL — ABNORMAL LOW (ref 8.9–10.3)
Chloride: 99 mmol/L (ref 98–111)
Creatinine, Ser: 5.51 mg/dL — ABNORMAL HIGH (ref 0.61–1.24)
GFR, Estimated: 12 mL/min — ABNORMAL LOW (ref >60)
Glucose, Bld: 98 mg/dL (ref 70–99)
Potassium: 3.9 mmol/L (ref 3.5–5.1)
Sodium: 134 mmol/L — ABNORMAL LOW (ref 135–145)

## 2021-05-07 LAB — PROCALCITONIN
Procalcitonin: 0.12 ng/mL
Procalcitonin: 0.14 ng/mL

## 2021-05-07 LAB — CK: Total CK: 84 U/L (ref 49–397)

## 2021-05-07 LAB — PHOSPHORUS: Phosphorus: 3.5 mg/dL (ref 2.5–4.6)

## 2021-05-07 LAB — BRAIN NATRIURETIC PEPTIDE: B Natriuretic Peptide: 147.8 pg/mL — ABNORMAL HIGH (ref 0.0–100.0)

## 2021-05-07 LAB — FOLATE: Folate: 9.1 ng/mL (ref >5.9)

## 2021-05-07 LAB — VITAMIN B12: Vitamin B-12: 291 pg/mL (ref 180–914)

## 2021-05-07 MED ORDER — AMLODIPINE BESYLATE 5 MG PO TABS
5.0000 mg | ORAL_TABLET | Freq: Every day | ORAL | Status: DC
  Administered 2021-05-08 – 2021-05-10 (×3): 5 mg via ORAL
  Filled 2021-05-07 (×3): qty 1

## 2021-05-07 MED ORDER — WHITE PETROLATUM EX OINT
TOPICAL_OINTMENT | CUTANEOUS | Status: AC
  Filled 2021-05-07: qty 28.35

## 2021-05-07 MED ORDER — VANCOMYCIN HCL 500 MG/100ML IV SOLN
500.0000 mg | Freq: Once | INTRAVENOUS | Status: AC
  Administered 2021-05-07: 500 mg via INTRAVENOUS
  Filled 2021-05-07: qty 100

## 2021-05-07 MED ORDER — CHLORHEXIDINE GLUCONATE CLOTH 2 % EX PADS
6.0000 | MEDICATED_PAD | Freq: Every day | CUTANEOUS | Status: DC
  Administered 2021-05-08 – 2021-05-09 (×2): 6 via TOPICAL

## 2021-05-07 MED ORDER — OXYCODONE-ACETAMINOPHEN 5-325 MG PO TABS
1.0000 | ORAL_TABLET | Freq: Once | ORAL | Status: AC
  Administered 2021-05-07: 1 via ORAL
  Filled 2021-05-07: qty 1

## 2021-05-07 MED ORDER — DOXERCALCIFEROL 4 MCG/2ML IV SOLN
1.0000 ug | INTRAVENOUS | Status: DC
  Administered 2021-05-07: 1 ug via INTRAVENOUS
  Filled 2021-05-07: qty 2

## 2021-05-07 MED ORDER — VITAMIN B-12 1000 MCG PO TABS
1000.0000 ug | ORAL_TABLET | Freq: Every day | ORAL | Status: DC
  Administered 2021-05-07 – 2021-05-10 (×4): 1000 ug via ORAL
  Filled 2021-05-07 (×4): qty 1

## 2021-05-07 NOTE — Procedures (Signed)
Patient was seen on dialysis and the procedure was supervised.  BFR 400  Via AVF BP is  121/80.   Patient appears to be tolerating treatment well  Christopher Davila 05/07/2021

## 2021-05-07 NOTE — Progress Notes (Addendum)
PROGRESS NOTE    Christopher Davila  YIF:[id number] DOB: Jul 29, 1970 DOA: 05/06/2021 PCP: [name], MD   Brief Narrative: 50 year old with past medical history significant for ESRD on hemodialysis, blindness, heart failure preserved ejection fraction, hypertension, chronic supplemental oxygen requirement since admission with COVID last month, recent left thoracentesis and recent treatment for bacterial pneumonia, presents to the ED with generalized weakness and fatigue.  He has not been able to ambulate with his walker.  He has been treated with 1 g intramuscular of Rocephin and Levaquin at a skilled nursing facility completed approximately 1 week ago.  He underwent left thoracentesis 05/03/2021, not exudative by lights criteria.  Elevation in the ED patient was afebrile oxygen sat 90 on 3 L, mildly tachypneic.  CT chest showed patchy bilateral consolidation consistent with multifocal pneumonia as well as bilateral pleural effusion.  CT abdomen and pelvis negative for acute abnormality.  White blood cell 13.  COVID and influenza negative.   Assessment & Plan:   Principal Problem:   Multifocal pneumonia Active Problems:   Heart failure with preserved ejection fraction (HCC)   Acute respiratory failure with hypoxia (HCC)   End-stage renal disease on hemodialysis (HCC)   1-Multifocal Pneumonia, Acute Hypoxic Respiratory Failure: -CT chest: Patchy consolidation and bilateral effusion right greater than left similar to that seen on prior CT.  This is again consistent with multifocal pneumonia. -Presented with leukocytosis. -Continue with IV ceftriaxone and azithromycin. -Follow culture  Generalized weakness, fatigue,  Patient reports unable to move lower extremities, he has been in bed for over the weekend.  He was out at the facility. -Plan to check MRI brain: No evidence of recent infarction, hemorrhage or mass. -B12: 291.  Start supplement -TSH: 2.6 -  Bilateral pleural  effusion: Recently evaluated by pulmonology for this suspected to be transudative fluid from left pleural fluid from 05/27/2016 was transudative according to lights criteria Volume managed with hemodialysis  Chronic diastolic heart failure: Appears compensated.  Volume managed with hemodialysis.  Hypertension: Continue with Norvasc, hydralazine, losartan.   ESRD on hemodialysis Patient gets hemodialysis Monday Wednesday and Friday. Plan for hemodialysis today.  Nephrology consulted.  Hyponatremia; Volume manage with HD.    Estimated body mass index is 23.03 kg/m as calculated from the following:   Height as of this encounter: 5\' 3"  (1.6 m).   Weight as of this encounter: 59 kg.   DVT prophylaxis: Heparin Code Status: Full code Family Communication: Care discussed with patient Disposition Plan:  Status is: Inpatient  Remains inpatient appropriate because: Patient admitted with generalized weakness fatigue, inability to ambulate.        Consultants:  None Procedures:  None  Antimicrobials:    Subjective: Patient reports that he is feeling very out, fatigued and tired at the facility.  He is slept most of the days Saturday and Sunday.  He is not able to move his legs well.  Objective: Vitals:   05/07/21 0500 05/07/21 0545 05/07/21 0600 05/07/21 0615  BP: 136/86  111/72   Pulse: 80 82 78 78  Resp: 18 16 14 18   Temp:      TempSrc:      SpO2: 100% 100% 100% 100%  Weight:      Height:        Intake/Output Summary (Last 24 hours) at 05/07/2021 0742 Last data filed at 05/07/2021 0044 Gross per 24 hour  Intake 600 ml  Output --  Net 600 ml   Filed Weights   05/06/21 1802  Weight: 59 kg    Examination:  General exam: Appears calm and comfortable  Respiratory system: Clear to auscultation. Respiratory effort normal. Cardiovascular system: S1 & S2 heard, RRR. No JVD, murmurs, rubs, gallops or clicks. No pedal edema. Gastrointestinal system: Abdomen is  nondistended, soft and nontender. No organomegaly or masses felt. Normal bowel sounds heard. Central nervous system: He is alert, conversant, moves bilateral upper extremity, lower bilateral lower extremity weakness Extremities: no edema    Data Reviewed: I have personally reviewed following labs and imaging studies  CBC: Recent Labs  Lab 05/06/21 1820 05/07/21 0500  WBC 13.0* 10.7*  HGB 11.2* 9.4*  HCT 35.9* 31.1*  MCV 91.6 91.2  PLT 368 287   Basic Metabolic Panel: Recent Labs  Lab 05/06/21 1820 05/07/21 0500  NA 136 134*  K 3.9 3.9  CL 96* 99  CO2 28 26  GLUCOSE 113* 98  BUN 26* 29*  CREATININE 5.08* 5.51*  CALCIUM 9.4 8.6*  PHOS  --  3.5   GFR: Estimated Creatinine Clearance: 13.1 mL/min (A) (by C-G formula based on SCr of 5.51 mg/dL (H)). Liver Function Tests: Recent Labs  Lab 05/06/21 1820  AST 17  ALT 13  ALKPHOS 98  BILITOT 0.3  PROT 7.1  ALBUMIN 3.1*   Recent Labs  Lab 05/06/21 1820  LIPASE 26   No results for input(s): AMMONIA in the last 168 hours. Coagulation Profile: No results for input(s): INR, PROTIME in the last 168 hours. Cardiac Enzymes: Recent Labs  Lab 05/06/21 2351  CKTOTAL 84   BNP (last 3 results) No results for input(s): PROBNP in the last 8760 hours. HbA1C: No results for input(s): HGBA1C in the last 72 hours. CBG: No results for input(s): GLUCAP in the last 168 hours. Lipid Profile: No results for input(s): CHOL, HDL, LDLCALC, TRIG, CHOLHDL, LDLDIRECT in the last 72 hours. Thyroid Function Tests: Recent Labs    05/06/21 2355  TSH 2.649   Anemia Panel: Recent Labs    05/07/21 0500  VITAMINB12 291  FOLATE 9.1   Sepsis Labs: Recent Labs  Lab 05/06/21 2351 05/07/21 0500  PROCALCITON 0.12 0.14    Recent Results (from the past 240 hour(s))  Resp Panel by RT-PCR (Flu A&B, Covid) Nasopharyngeal Swab     Status: None   Collection Time: 05/06/21  6:25 PM   Specimen: Nasopharyngeal Swab; Nasopharyngeal(NP)  swabs in vial transport medium  Result Value Ref Range Status   SARS Coronavirus 2 by RT PCR NEGATIVE NEGATIVE Final    Comment: (NOTE) SARS-CoV-2 target nucleic acids are NOT DETECTED.  The SARS-CoV-2 RNA is generally detectable in upper respiratory specimens during the acute phase of infection. The lowest concentration of SARS-CoV-2 viral copies this assay can detect is 138 copies/mL. A negative result does not preclude SARS-Cov-2 infection and should not be used as the sole basis for treatment or other patient management decisions. A negative result may occur with  improper specimen collection/handling, submission of specimen other than nasopharyngeal swab, presence of viral mutation(s) within the areas targeted by this assay, and inadequate number of viral copies(<138 copies/mL). A negative result must be combined with clinical observations, patient history, and epidemiological information. The expected result is Negative.  Fact Sheet for Patients:  EntrepreneurPulse.com.au  Fact Sheet for Healthcare Providers:  IncredibleEmployment.be  This test is no t yet approved or cleared by the [place] FDA and  has been authorized for detection and/or diagnosis of SARS-CoV-2 by FDA under an Emergency Use Authorization (EUA). This  EUA will remain  in effect (meaning this test can be used) for the duration of the COVID-19 declaration under Section 564(b)(1) of the Act, 21 U.S.C.section 360bbb-3(b)(1), unless the authorization is terminated  or revoked sooner.       Influenza A by PCR NEGATIVE NEGATIVE Final   Influenza B by PCR NEGATIVE NEGATIVE Final    Comment: (NOTE) The Xpert Xpress SARS-CoV-2/FLU/RSV plus assay is intended as an aid in the diagnosis of influenza from Nasopharyngeal swab specimens and should not be used as a sole basis for treatment. Nasal washings and aspirates are unacceptable for Xpert Xpress  SARS-CoV-2/FLU/RSV testing.  Fact Sheet for Patients: EntrepreneurPulse.com.au  Fact Sheet for Healthcare Providers: IncredibleEmployment.be  This test is not yet approved or cleared by the [place] FDA and has been authorized for detection and/or diagnosis of SARS-CoV-2 by FDA under an Emergency Use Authorization (EUA). This EUA will remain in effect (meaning this test can be used) for the duration of the COVID-19 declaration under Section 564(b)(1) of the Act, 21 U.S.C. section 360bbb-3(b)(1), unless the authorization is terminated or revoked.  Performed at [facility], [place] [address], [place], [place] [address]          Radiology Studies: DG Chest 2 View  Result Date: 05/06/2021 CLINICAL DATA:  Status post thoracentesis EXAM: CHEST - 2 VIEW COMPARISON:  05/03/2021 FINDINGS: The lungs are symmetrically well expanded. Small left pleural effusion is present, decreased in size since prior examination and partially loculated laterally. Small left pleural effusion is unchanged. Mild superimposed bilateral perihilar and lower lung zone interstitial infiltrate persists, asymmetrically more severe on the left, edema versus infection. Cardiac size is mildly enlarged, unchanged. No pneumothorax. IMPRESSION: Interval right thoracentesis. Small residual right pleural effusion, partially laterally loculated. No pneumothorax. Stable small left pleural effusion. Mild perihilar and lower lung zone interstitial pulmonary infiltrate, asymmetrically more severe on the left, edema versus infection. Electronically Signed   By: Fidela Salisbury M.D.   On: 05/06/2021 19:44   CT CHEST ABDOMEN PELVIS W CONTRAST  Result Date: 05/06/2021 CLINICAL DATA:  Weakness and abdominal pain, initial encounter EXAM: CT CHEST, ABDOMEN, AND PELVIS WITH CONTRAST TECHNIQUE: Multidetector CT imaging of the chest, abdomen and pelvis was performed following the standard protocol  during bolus administration of intravenous contrast. CONTRAST:  27mL ISOVUE-300 IOPAMIDOL (ISOVUE-300) INJECTION 61% COMPARISON:  Chest x-ray from earlier in the same day, CT from 03/30/2021. FINDINGS: CT CHEST FINDINGS Cardiovascular: Thoracic aorta demonstrates a normal branching pattern. Mild atherosclerotic calcifications are noted. No aneurysmal dilatation or dissection is noted. Pulmonary artery is within normal limits without evidence of embolus although timing was not performed for embolus evaluation. Mild coronary calcifications are noted. Mediastinum/Nodes: No mediastinal or hilar adenopathy is noted. The esophagus as visualized is within normal limits. The thoracic inlet is unremarkable. Lungs/Pleura: Bilateral pleural effusions are noted right considerably greater than left. Patchy consolidation is noted consistent with multifocal pneumonia within the lower lobes bilaterally. These changes are relatively stable from prior CT from 03/30/2021. No new parenchymal nodule is noted. The ground-glass attenuation has nearly completely resolved when compared with the prior CT. Musculoskeletal: No acute rib abnormality is noted. No acute bony abnormality is seen. CT ABDOMEN PELVIS FINDINGS Hepatobiliary: Gallbladder is well distended with dependent gallstones. Liver is within normal limits. No obstructive changes are seen. Pancreas: Unremarkable. No pancreatic ductal dilatation or surrounding inflammatory changes. Spleen: Normal in size without focal abnormality. Adrenals/Urinary Tract: Adrenal glands are within normal limits. Kidneys demonstrate a  normal enhancement pattern bilaterally. Bladder is well distended. Stomach/Bowel: No obstructive or inflammatory changes of the colon are seen. Appendix is not well visualized. No inflammatory changes to suggest appendicitis are seen. Small bowel and stomach are within normal limits. Vascular/Lymphatic: Aortic atherosclerosis. No enlarged abdominal or pelvic lymph  nodes. Reproductive: Prostate is unremarkable. Other: No abdominal wall hernia or abnormality. No abdominopelvic ascites. Musculoskeletal: No acute or significant osseous findings. IMPRESSION: CT of the chest: Patchy consolidation and bilateral effusions right greater than left similar to that seen on prior CT. This is again consistent with multifocal pneumonia. No new focal abnormality is noted. CT of the abdomen and pelvis: Cholelithiasis without complicating factors. No other focal abnormality is seen. Electronically Signed   By: Inez Catalina M.D.   On: 05/06/2021 20:28        Scheduled Meds:  amLODipine  10 mg Oral Daily   aspirin EC  81 mg Oral Daily   atorvastatin  40 mg Oral Daily   atropine  1 drop Both Eyes BID   brimonidine  1 drop Left Eye BID   calcium acetate  667 mg Oral TID WC   dorzolamide-timolol  1 drop Left Eye BID   heparin  5,000 Units Subcutaneous Q8H   hydrALAZINE  75 mg Oral Q8H   latanoprost  1 drop Left Eye QHS   losartan  100 mg Oral Daily   melatonin  10 mg Oral QHS   olopatadine  2 drop Both Eyes Daily   polyvinyl alcohol  1 drop Both Eyes TID   prednisoLONE acetate  1 drop Both Eyes TID AC & HS   pregabalin  25 mg Oral BID   senna  2 tablet Oral QHS   sertraline  25 mg Oral Daily   sodium chloride flush  3 mL Intravenous Q12H   torsemide  80 mg Oral BID   vancomycin variable dose per unstable renal function (pharmacist dosing)   Does not apply See admin instructions   Continuous Infusions:  azithromycin Stopped (05/07/21 0044)   cefTRIAXone (ROCEPHIN)  IV       LOS: 1 day    Time spent: 35 minutes    Maliea Grandmaison A Ardith Test, MD [facility]   If 7PM-7AM, please contact night-coverage [email or web address]  05/07/2021, 7:42 AM

## 2021-05-07 NOTE — ED Notes (Signed)
Breakfast orders placed 

## 2021-05-07 NOTE — Consult Note (Addendum)
[facility] Renal Consultation Note    Indication for Consultation:  Management of ESRD/hemodialysis, anemia, hypertension/volume, and secondary hyperparathyroidism.  HPI: Christopher Davila is a 50 y.o. male with PMH including ESRD on dialysis MWF, CHF, DM, gastroparesis, and recent admission for COVID 19, who presented to the ED from his SNF on 05/06/21 with chief complaint of generalized weakness and fatigue.  Patient reportedly was being treated for pneumonia at his SNF 1 week ago with Rocephin and Levaquin.  He underwent thoracentesis on 05/03/2021.  However, he reports progressive weakness and generalized fatigue, no longer ambulatory with walker.  In the ED he was found to be afebrile, saturations 90% on 3 L, mildly tachypneic, with stable blood pressure.  CT of the chest showed patchy bilateral consolidation consistent with multifocal pneumonia and bilateral pleural effusions.  COVID and flu swabs negative.  Labs this a.m. notable for K3.9, BUN 29, creatinine 5.51, calcium 8.6, BNP 147.8, white blood cell 10.7, hemoglobin 9.4.  Patient was given Percocet, vancomycin, and cefepime in the ED, and admitted for management of pneumonia.  Nephrology was consulted for management of ESRD.  Patient examined in the emergency room.  He reports significant ongoing weakness and fatigue.  Also reports he is not thinking clearly and is having trouble communicating his thoughts.  He is alert and oriented x3 however.  At present he denies any shortness of breath, cough, orthopnea, chest pain, dizziness, abdominal pain, nausea, vomiting, diarrhea.  He does report back pain here where his thoracentesis was done, and reports he has a sore spot on his right lower abdomen.  His last dialysis was 05/04/2021  Past Medical History:  Diagnosis Date   Anemia    Blindness 09/04/2019   CHF (congestive heart failure) (HCC)    Diabetes mellitus    Diarrhea 10/14/2019   Epigastric abdominal pain    ESRD (end stage  renal disease) ([place])    Gastroparesis due to DM ([place])    Hypertension    Hypoalbuminemia 04/17/2019   Hypoxia    Neuropathy of lower extremity    Oral candidiasis 06/26/2012   Pancreatitis 06/26/2012   Weight loss 06/26/2012   Past Surgical History:  Procedure Laterality Date   AV FISTULA PLACEMENT Right 10/29/2019   Procedure: RIGHT ARM BRACHIOBASILIC ARTERIOVENOUS (AV) FISTULA CREATION;  Surgeon: Rosetta Posner, MD;  Location: [facility] OR;  Service: Vascular;  Laterality: Right;   [place] Right 01/05/2020   Procedure: RIGHT ARM SECOND STAGE [place];  Surgeon: Rosetta Posner, MD;  Location: [facility] OR;  Service: Vascular;  Laterality: Right;   EYE SURGERY     IR FLUORO GUIDE CV LINE RIGHT  10/28/2019   IR US GUIDE VASC ACCESS RIGHT  10/28/2019   Family History  Problem Relation Age of Onset   Diabetes Mother    Lung disease Mother    Hypertension Mother    Diabetes Maternal Aunt    CAD Maternal Aunt    CAD Cousin    Social History:  reports that he quit smoking about 6 years ago. His smoking use included cigarettes. He has never used smokeless tobacco. He reports that he does not currently use alcohol. He reports that he does not use drugs.  ROS: As per HPI otherwise negative.  Physical Exam: Vitals:   05/07/21 0545 05/07/21 0600 05/07/21 0615 05/07/21 0700  BP:  111/72  118/71  Pulse: 82 78 78 81  Resp: 16 14 18 12   Temp:  TempSrc:      SpO2: 100% 100% 100% 95%  Weight:      Height:         General: Well developed, well nourished, in no acute distress. Head: Normocephalic, atraumatic, sclera non-icteric, mucus membranes are moist. Neck: JVD not elevated. Lungs: Clear bilaterally to auscultation without wheezes, rales, or rhonchi. Breathing is unlabored on O2 3L via [place] Heart: RRR with normal S1, S2. No murmurs, rubs, or gallops appreciated. Abdomen: Soft, mild TTP RLQ, non-distended with normoactive bowel sounds. No rebound/guarding. No  obvious abdominal masses. Musculoskeletal:  Strength and tone appear normal for age. Lower extremities: No edema b/l lower extremities Neuro: Alert and oriented X 3. Moves all extremities spontaneously. Psych:  Responds to questions appropriately with a normal affect. Dialysis Access: RUE AVF +t/b  Allergies  Allergen Reactions   Morphine And Related Itching and Other (See Comments)    Pt prefers not to be given this drug   Prior to Admission medications   Medication Sig Start Date End Date Taking? Authorizing Provider  albuterol (VENTOLIN HFA) 108 (90 Base) MCG/ACT inhaler Inhale 2 puffs into the lungs every 4 (four) hours as needed for shortness of breath.   Yes [provider]  amLODipine (NORVASC) 10 MG tablet Take 1 tablet (10 mg total) by mouth daily. 11/19/19  Yes Charlynne Cousins, MD  aspirin EC 81 MG tablet Take 1 tablet (81 mg total) by mouth daily. 05/24/15  Yes Ghimire, Henreitta Leber, MD  atorvastatin (LIPITOR) 40 MG tablet Take 1 tablet (40 mg total) by mouth daily. 12/05/20  Yes Martinique, Peter M, MD  atropine 1 % ophthalmic solution Place 1 drop into both eyes in the morning and at bedtime. 04/24/21  Yes [provider]  brimonidine (ALPHAGAN P) 0.1 % SOLN Place 1 drop into the left eye in the morning and at bedtime.   Yes [provider]  calcium acetate (PHOSLO) 667 MG capsule Take 1 capsule (667 mg total) by mouth 3 (three) times daily with meals. 11/19/19  Yes Charlynne Cousins, MD  Dextran 70-Hypromellose (TEARS PURE) 0.1-0.3 % SOLN Place 1 drop into both eyes in the morning, at noon, in the evening, and at bedtime.   Yes [provider]  diphenhydrAMINE (BENADRYL) 25 MG tablet Take 25 mg by mouth every 8 (eight) hours as needed for itching.   Yes [provider]  dorzolamide-timolol (COSOPT) 22.3-6.8 MG/ML ophthalmic solution Place 1 drop into the left eye 2 (two) times daily. 09/23/19  Yes Vann, Jessica U, DO  ferrous sulfate 325 (65  FE) MG tablet Take 325 mg by mouth daily. 08/25/20  Yes [provider]  guaiFENesin (ROBITUSSIN) 100 MG/5ML liquid Take 100 mg by mouth every 4 (four) hours as needed for cough.   Yes [provider]  hydrALAZINE (APRESOLINE) 50 MG tablet Take 1.5 tablets (75 mg total) by mouth 3 (three) times daily. 04/26/21 04/21/22 Yes Loel Dubonnet, NP  ibuprofen (ADVIL) 800 MG tablet Take 800 mg by mouth every 12 (twelve) hours as needed for mild pain.   Yes [provider]  latanoprost (XALATAN) 0.005 % ophthalmic solution Place 1 drop into the left eye at bedtime. 09/23/19  Yes Vann, Jessica U, DO  losartan (COZAAR) 100 MG tablet Take 1 tablet (100 mg total) by mouth daily. 11/19/19  Yes Charlynne Cousins, MD  Melatonin 10 MG CAPS Take 10 mg by mouth at bedtime.   Yes [provider]  Melatonin 10 MG  TABS Take 1 tablet by mouth at bedtime.   Yes [provider]  olopatadine (PATANOL) 0.1 % ophthalmic solution Place 2 drops into both eyes daily.   Yes [provider]  ondansetron (ZOFRAN) 4 MG tablet Take 4 mg by mouth every 6 (six) hours as needed for nausea or vomiting.   Yes [provider]  oxyCODONE-acetaminophen (PERCOCET/ROXICET) 5-325 MG tablet Take 1 tablet by mouth See admin instructions. 1 tablet once daily every Tuesday, Thursday, Saturday, and Sunday. May take an additional 1 tablet every 6 hours as needed on Monday's Wednesday's, and Friday's. 04/10/21  Yes Pahwani, Einar Grad, MD  polyethylene glycol (MIRALAX / GLYCOLAX) 17 g packet Take 17 g by mouth daily.   Yes [provider]  prednisoLONE acetate (PRED FORTE) 1 % ophthalmic suspension Place 1 drop into both eyes 4 (four) times daily.   Yes [provider]  pregabalin (LYRICA) 25 MG capsule Take 25 mg by mouth 2 (two) times daily. 08/25/20  Yes [provider]  senna (SENOKOT) 8.6 MG TABS tablet Take 2 tablets by mouth at bedtime.   Yes [provider]   sertraline (ZOLOFT) 25 MG tablet Take 25 mg by mouth daily. On Monday's, Wednesday's, and Friday's, take after dialysis. All other days take in the morning.   Yes [provider]  torsemide (DEMADEX) 20 MG tablet Take 80 mg by mouth 2 (two) times daily.   Yes [provider]  Vitamin D, Ergocalciferol, (DRISDOL) 1.25 MG (50000 UNIT) CAPS capsule Take 50,000 Units by mouth every 7 (seven) days. Sunday's   Yes [provider]  OXYGEN Inhale 3 L into the lungs continuous.    [provider]   Current Facility-Administered Medications  Medication Dose Route Frequency Provider Last Rate Last Admin   acetaminophen (TYLENOL) tablet 650 mg  650 mg Oral Q6H PRN Opyd, Ilene Qua, MD       Or   acetaminophen (TYLENOL) suppository 650 mg  650 mg Rectal Q6H PRN Opyd, Ilene Qua, MD       albuterol (PROVENTIL) (2.5 MG/3ML) 0.083% nebulizer solution 2.5 mg  2.5 mg Inhalation Q4H PRN Opyd, Ilene Qua, MD       amLODipine (NORVASC) tablet 10 mg  10 mg Oral Daily Opyd, Ilene Qua, MD       aspirin EC tablet 81 mg  81 mg Oral Daily Opyd, Ilene Qua, MD       atorvastatin (LIPITOR) tablet 40 mg  40 mg Oral Daily Opyd, Ilene Qua, MD       atropine 1 % ophthalmic solution 1 drop  1 drop Both Eyes BID Opyd, Ilene Qua, MD   1 drop at 05/06/21 2339   azithromycin (ZITHROMAX) 500 mg in sodium chloride 0.9 % 250 mL IVPB  500 mg Intravenous QHS Opyd, Ilene Qua, MD   Stopped at 05/07/21 0044   brimonidine (ALPHAGAN) 0.2 % ophthalmic solution 1 drop  1 drop Left Eye BID Opyd, Ilene Qua, MD   1 drop at 05/06/21 2338   calcium acetate (PHOSLO) capsule 667 mg  667 mg Oral TID WC Opyd, Ilene Qua, MD   667 mg at 05/07/21 0752   cefTRIAXone (ROCEPHIN) 2 g in sodium chloride 0.9 % 100 mL IVPB  2 g Intravenous Q24H Opyd, Ilene Qua, MD 200 mL/hr at 05/07/21 0751 2 g at 05/07/21 0751   diphenhydrAMINE (BENADRYL) capsule 25 mg  25 mg Oral Once PRN Opyd, Ilene Qua, MD       dorzolamide-timolol (  COSOPT)  22.3-6.8 MG/ML ophthalmic solution 1 drop  1 drop Left Eye BID Opyd, Ilene Qua, MD   1 drop at 05/06/21 2340   guaiFENesin (ROBITUSSIN) 100 MG/5ML liquid 100 mg  100 mg Oral Q4H PRN Opyd, Ilene Qua, MD       heparin injection 5,000 Units  5,000 Units Subcutaneous Q8H Opyd, Ilene Qua, MD       hydrALAZINE (APRESOLINE) tablet 75 mg  75 mg Oral Q8H Opyd, Ilene Qua, MD   75 mg at 05/07/21 0508   latanoprost (XALATAN) 0.005 % ophthalmic solution 1 drop  1 drop Left Eye QHS Opyd, Ilene Qua, MD   1 drop at 05/06/21 2341   losartan (COZAAR) tablet 100 mg  100 mg Oral Daily Opyd, Ilene Qua, MD       melatonin tablet 10 mg  10 mg Oral QHS Opyd, Ilene Qua, MD   10 mg at 05/06/21 2327   olopatadine (PATANOL) 0.1 % ophthalmic solution 2 drop  2 drop Both Eyes Daily Opyd, Ilene Qua, MD       oxyCODONE (Oxy IR/ROXICODONE) immediate release tablet 5 mg  5 mg Oral Q6H PRN Opyd, Ilene Qua, MD   5 mg at 05/07/21 0531   polyvinyl alcohol (LIQUIFILM TEARS) 1.4 % ophthalmic solution 1 drop  1 drop Both Eyes TID Opyd, Ilene Qua, MD   1 drop at 05/06/21 2339   prednisoLONE acetate (PRED FORTE) 1 % ophthalmic suspension 1 drop  1 drop Both Eyes TID AC & HS Opyd, Ilene Qua, MD   1 drop at 05/07/21 0754   pregabalin (LYRICA) capsule 25 mg  25 mg Oral BID Vianne Bulls, MD   25 mg at 05/06/21 2327   senna (SENOKOT) tablet 17.2 mg  2 tablet Oral QHS Opyd, Ilene Qua, MD   17.2 mg at 05/06/21 2327   sertraline (ZOLOFT) tablet 25 mg  25 mg Oral Daily Opyd, Ilene Qua, MD       sodium chloride flush (NS) 0.9 % injection 3 mL  3 mL Intravenous Q12H Opyd, Ilene Qua, MD   3 mL at 05/06/21 2327   torsemide (DEMADEX) tablet 80 mg  80 mg Oral BID Vianne Bulls, MD   80 mg at 05/07/21 0751   vancomycin variable dose per unstable renal function (pharmacist dosing)   Does not apply See admin instructions Opyd, Ilene Qua, MD       Current Outpatient Medications  Medication Sig Dispense Refill   albuterol (VENTOLIN HFA) 108 (90 Base)  MCG/ACT inhaler Inhale 2 puffs into the lungs every 4 (four) hours as needed for shortness of breath.     amLODipine (NORVASC) 10 MG tablet Take 1 tablet (10 mg total) by mouth daily.     aspirin EC 81 MG tablet Take 1 tablet (81 mg total) by mouth daily.     atorvastatin (LIPITOR) 40 MG tablet Take 1 tablet (40 mg total) by mouth daily. 90 tablet 3   atropine 1 % ophthalmic solution Place 1 drop into both eyes in the morning and at bedtime.     brimonidine (ALPHAGAN P) 0.1 % SOLN Place 1 drop into the left eye in the morning and at bedtime.     calcium acetate (PHOSLO) 667 MG capsule Take 1 capsule (667 mg total) by mouth 3 (three) times daily with meals.     Dextran 70-Hypromellose (TEARS PURE) 0.1-0.3 % SOLN Place 1 drop into both eyes in the morning, at noon, in the evening, and  at bedtime.     diphenhydrAMINE (BENADRYL) 25 MG tablet Take 25 mg by mouth every 8 (eight) hours as needed for itching.     dorzolamide-timolol (COSOPT) 22.3-6.8 MG/ML ophthalmic solution Place 1 drop into the left eye 2 (two) times daily. 10 mL 12   ferrous sulfate 325 (65 FE) MG tablet Take 325 mg by mouth daily.     guaiFENesin (ROBITUSSIN) 100 MG/5ML liquid Take 100 mg by mouth every 4 (four) hours as needed for cough.     hydrALAZINE (APRESOLINE) 50 MG tablet Take 1.5 tablets (75 mg total) by mouth 3 (three) times daily. 135 tablet 11   ibuprofen (ADVIL) 800 MG tablet Take 800 mg by mouth every 12 (twelve) hours as needed for mild pain.     latanoprost (XALATAN) 0.005 % ophthalmic solution Place 1 drop into the left eye at bedtime. 2.5 mL 12   losartan (COZAAR) 100 MG tablet Take 1 tablet (100 mg total) by mouth daily.     Melatonin 10 MG CAPS Take 10 mg by mouth at bedtime.     Melatonin 10 MG TABS Take 1 tablet by mouth at bedtime.     olopatadine (PATANOL) 0.1 % ophthalmic solution Place 2 drops into both eyes daily.     ondansetron (ZOFRAN) 4 MG tablet Take 4 mg by mouth every 6 (six) hours as needed for  nausea or vomiting.     oxyCODONE-acetaminophen (PERCOCET/ROXICET) 5-325 MG tablet Take 1 tablet by mouth See admin instructions. 1 tablet once daily every Tuesday, Thursday, Saturday, and Sunday. May take an additional 1 tablet every 6 hours as needed on Monday's Wednesday's, and Friday's. 20 tablet 0   polyethylene glycol (MIRALAX / GLYCOLAX) 17 g packet Take 17 g by mouth daily.     prednisoLONE acetate (PRED FORTE) 1 % ophthalmic suspension Place 1 drop into both eyes 4 (four) times daily.     pregabalin (LYRICA) 25 MG capsule Take 25 mg by mouth 2 (two) times daily.     senna (SENOKOT) 8.6 MG TABS tablet Take 2 tablets by mouth at bedtime.     sertraline (ZOLOFT) 25 MG tablet Take 25 mg by mouth daily. On Monday's, Wednesday's, and Friday's, take after dialysis. All other days take in the morning.     torsemide (DEMADEX) 20 MG tablet Take 80 mg by mouth 2 (two) times daily.     Vitamin D, Ergocalciferol, (DRISDOL) 1.25 MG (50000 UNIT) CAPS capsule Take 50,000 Units by mouth every 7 (seven) days. Sunday's     OXYGEN Inhale 3 L into the lungs continuous.     Labs: Basic Metabolic Panel: Recent Labs  Lab 05/06/21 1820 05/07/21 0500  NA 136 134*  K 3.9 3.9  CL 96* 99  CO2 28 26  GLUCOSE 113* 98  BUN 26* 29*  CREATININE 5.08* 5.51*  CALCIUM 9.4 8.6*  PHOS  --  3.5   Liver Function Tests: Recent Labs  Lab 05/06/21 1820  AST 17  ALT 13  ALKPHOS 98  BILITOT 0.3  PROT 7.1  ALBUMIN 3.1*   Recent Labs  Lab 05/06/21 1820  LIPASE 26   No results for input(s): AMMONIA in the last 168 hours. CBC: Recent Labs  Lab 05/06/21 1820 05/07/21 0500  WBC 13.0* 10.7*  HGB 11.2* 9.4*  HCT 35.9* 31.1*  MCV 91.6 91.2  PLT 368 320   Cardiac Enzymes: Recent Labs  Lab 05/06/21 2351  CKTOTAL 84   CBG: No results for input(s): GLUCAP in  the last 168 hours. Iron Studies: No results for input(s): IRON, TIBC, TRANSFERRIN, FERRITIN in the last 72 hours. Studies/Results: DG Chest 2  View  Result Date: 05/06/2021 CLINICAL DATA:  Status post thoracentesis EXAM: CHEST - 2 VIEW COMPARISON:  05/03/2021 FINDINGS: The lungs are symmetrically well expanded. Small left pleural effusion is present, decreased in size since prior examination and partially loculated laterally. Small left pleural effusion is unchanged. Mild superimposed bilateral perihilar and lower lung zone interstitial infiltrate persists, asymmetrically more severe on the left, edema versus infection. Cardiac size is mildly enlarged, unchanged. No pneumothorax. IMPRESSION: Interval right thoracentesis. Small residual right pleural effusion, partially laterally loculated. No pneumothorax. Stable small left pleural effusion. Mild perihilar and lower lung zone interstitial pulmonary infiltrate, asymmetrically more severe on the left, edema versus infection. Electronically Signed   By: Fidela Salisbury M.D.   On: 05/06/2021 19:44   CT CHEST ABDOMEN PELVIS W CONTRAST  Result Date: 05/06/2021 CLINICAL DATA:  Weakness and abdominal pain, initial encounter EXAM: CT CHEST, ABDOMEN, AND PELVIS WITH CONTRAST TECHNIQUE: Multidetector CT imaging of the chest, abdomen and pelvis was performed following the standard protocol during bolus administration of intravenous contrast. CONTRAST:  36mL ISOVUE-300 IOPAMIDOL (ISOVUE-300) INJECTION 61% COMPARISON:  Chest x-ray from earlier in the same day, CT from 03/30/2021. FINDINGS: CT CHEST FINDINGS Cardiovascular: Thoracic aorta demonstrates a normal branching pattern. Mild atherosclerotic calcifications are noted. No aneurysmal dilatation or dissection is noted. Pulmonary artery is within normal limits without evidence of embolus although timing was not performed for embolus evaluation. Mild coronary calcifications are noted. Mediastinum/Nodes: No mediastinal or hilar adenopathy is noted. The esophagus as visualized is within normal limits. The thoracic inlet is unremarkable. Lungs/Pleura: Bilateral  pleural effusions are noted right considerably greater than left. Patchy consolidation is noted consistent with multifocal pneumonia within the lower lobes bilaterally. These changes are relatively stable from prior CT from 03/30/2021. No new parenchymal nodule is noted. The ground-glass attenuation has nearly completely resolved when compared with the prior CT. Musculoskeletal: No acute rib abnormality is noted. No acute bony abnormality is seen. CT ABDOMEN PELVIS FINDINGS Hepatobiliary: Gallbladder is well distended with dependent gallstones. Liver is within normal limits. No obstructive changes are seen. Pancreas: Unremarkable. No pancreatic ductal dilatation or surrounding inflammatory changes. Spleen: Normal in size without focal abnormality. Adrenals/Urinary Tract: Adrenal glands are within normal limits. Kidneys demonstrate a normal enhancement pattern bilaterally. Bladder is well distended. Stomach/Bowel: No obstructive or inflammatory changes of the colon are seen. Appendix is not well visualized. No inflammatory changes to suggest appendicitis are seen. Small bowel and stomach are within normal limits. Vascular/Lymphatic: Aortic atherosclerosis. No enlarged abdominal or pelvic lymph nodes. Reproductive: Prostate is unremarkable. Other: No abdominal wall hernia or abnormality. No abdominopelvic ascites. Musculoskeletal: No acute or significant osseous findings. IMPRESSION: CT of the chest: Patchy consolidation and bilateral effusions right greater than left similar to that seen on prior CT. This is again consistent with multifocal pneumonia. No new focal abnormality is noted. CT of the abdomen and pelvis: Cholelithiasis without complicating factors. No other focal abnormality is seen. Electronically Signed   By: Inez Catalina M.D.   On: 05/06/2021 20:28    Dialysis Orders:  Center: [facility]  on MWF . 180NRe 4 hours BFR 400 DFR 600 EDW 54kg 2K 2Ca AVF15g heparin 3000 unit bolus Mircera 173mcg IV q 2 weeks-  last dose 04/30/21 Hectorol 1 mcg IV q HD Calcium acetate 1 tabs PO q meal   Assessment/Plan:  Pneumonia: Ongoing  SOB despite recent thoracentesis and antibiotics. Started on vancomycin, rocephin and azithromycin. Management per primary team  ESRD:  Dialysis on MWF schedule. On a holiday schedule this week due to Thanksgiving with MWF patients dialyzing on Sunday, Tuesday, Friday. However since patient has not had HD since Friday, we will plan to dialyze today and reevaluate in the AM- might just need to keep him on his regular schedule.  Hypertension: BP well controlled. Continue norvasc, hydralazine and losartan.  I wonder if we should not try to lower his BP meds and lower his EDW-  since the hydral and losartan are redundant, will stop hydralazine and lower amlodipine to 5 Chronic diastolic CHF: With bilateral pleural effusions noted on CT and recent thoracentesis. He is above his EDW by weights here. Will UF with HD as tolerated which may also improve his respiratory status somewhat.  Still makes some urine, continue torsemide.   Anemia: Hgb at goal. Not due for ESA yet. Continue to monitor trend.   Metabolic bone disease: Calcium and phos controlled. Continue hectorol and calcium acetate.   Nutrition:  Renal diet with fluid restrictions. Will check albumin with next labs.   Anice Paganini, PA-C 05/07/2021, 10:06 AM  [facility] Pager: [phone]  Patient seen and examined, agree with above note with above modifications.  Seen on HD-   c/o dry mouth-  says hie  breathing is OK even though is hypoxic-  just c/o weakness-  BP low-  - as above will try to wean BP meds so can UF more with HD-  otherwise cont with appropriate titration of HD related meds -  will plan on next HD on Wednesday  Corliss Parish, MD 05/07/2021

## 2021-05-07 NOTE — ED Notes (Signed)
Pt given sandwich bag and diet soda, graham crackers per request. No acute changes noted. Will continue to monitor.

## 2021-05-07 NOTE — ED Notes (Signed)
Patient transported to MRI 

## 2021-05-08 DIAGNOSIS — J189 Pneumonia, unspecified organism: Secondary | ICD-10-CM | POA: Diagnosis not present

## 2021-05-08 LAB — GLUCOSE, CAPILLARY: Glucose-Capillary: 62 mg/dL — ABNORMAL LOW (ref 70–99)

## 2021-05-08 LAB — BASIC METABOLIC PANEL
Anion gap: 8 (ref 5–15)
BUN: 9 mg/dL (ref 6–20)
CO2: 28 mmol/L (ref 22–32)
Calcium: 8.2 mg/dL — ABNORMAL LOW (ref 8.9–10.3)
Chloride: 98 mmol/L (ref 98–111)
Creatinine, Ser: 2.83 mg/dL — ABNORMAL HIGH (ref 0.61–1.24)
GFR, Estimated: 26 mL/min — ABNORMAL LOW (ref >60)
Glucose, Bld: 146 mg/dL — ABNORMAL HIGH (ref 70–99)
Potassium: 3.3 mmol/L — ABNORMAL LOW (ref 3.5–5.1)
Sodium: 134 mmol/L — ABNORMAL LOW (ref 135–145)

## 2021-05-08 LAB — CBC
HCT: 31.3 % — ABNORMAL LOW (ref 39.0–52.0)
Hemoglobin: 9.4 g/dL — ABNORMAL LOW (ref 13.0–17.0)
MCH: 27.4 pg (ref 26.0–34.0)
MCHC: 30 g/dL (ref 30.0–36.0)
MCV: 91.3 fL (ref 80.0–100.0)
Platelets: 307 10*3/uL (ref 150–400)
RBC: 3.43 MIL/uL — ABNORMAL LOW (ref 4.22–5.81)
RDW: 17.4 % — ABNORMAL HIGH (ref 11.5–15.5)
WBC: 10.1 10*3/uL (ref 4.0–10.5)
nRBC: 0 % (ref 0.0–0.2)

## 2021-05-08 LAB — LEGIONELLA PNEUMOPHILA SEROGP 1 UR AG: L. pneumophila Serogp 1 Ur Ag: NEGATIVE

## 2021-05-08 LAB — MRSA NEXT GEN BY PCR, NASAL: MRSA by PCR Next Gen: NOT DETECTED

## 2021-05-08 LAB — PROCALCITONIN: Procalcitonin: 1.01 ng/mL

## 2021-05-08 MED ORDER — OXYCODONE-ACETAMINOPHEN 5-325 MG PO TABS
1.0000 | ORAL_TABLET | Freq: Three times a day (TID) | ORAL | Status: DC | PRN
  Administered 2021-05-08 – 2021-05-10 (×6): 2 via ORAL
  Administered 2021-05-10: 1 via ORAL
  Filled 2021-05-08 (×7): qty 2

## 2021-05-08 NOTE — Progress Notes (Addendum)
Pt receives out-pt HD at [facility] [phone]) on MWF. Pt arrives at 12:25 for 12:40 chair time. If pt d/c over the next few days, pt will need to receive next out-pt HD treatment on Saturday due to holiday schedule. Will assist as needed.  Melven Sartorius Renal Navigator [phone]

## 2021-05-08 NOTE — TOC Progression Note (Addendum)
Transition of Care [facility]) - Initial/Assessment Note    Patient Details  Name: Christopher Davila MRN: [id number] Date of Birth: 06/07/1971  Transition of Care [facility]) CM/SW Contact:    Milinda Antis, [place] Phone Number: 05/08/2021, 10:28 AM  Clinical Narrative:                  CSW contacted Tressa Busman with [name] to inquire about whether the patient is LTC at the facility and is awaiting a response.  CSW following patient for any d/c planning needs once medically stable.  10:37Tressa Busman with admissions at [facility] verified that the patient is LTC at the facility.    CSW spoke with the patient at bedside.  The patient was alert, oriented x4, and reported that he was "feeling better".  Patient also verified that he is LTC at [facility] and is in agreement with returning when medically ready.  Lind Covert, MSW, LCSWA        Patient Goals and CMS Choice        Expected Discharge Plan and Services                                                Prior Living Arrangements/Services                       Activities of Daily Living      Permission Sought/Granted                  Emotional Assessment              Admission diagnosis:  Pain [R52] Multifocal pneumonia [J18.9] Patient Active Problem List   Diagnosis Date Noted   Multifocal pneumonia 05/06/2021   Shortness of breath    COVID-19    Hypoxia    End-stage renal disease on hemodialysis ([place])    Hypervolemia    History of thoracentesis    S/P thoracentesis    Acute respiratory failure with hypoxia ([place]) 03/26/2021   Pressure injury of skin 11/06/2019   ESRD on dialysis [facility])    Heart failure with preserved ejection fraction ([place])    Goals of care, counseling/discussion    Palliative care encounter    Diarrhea 20/94/7096   Metabolic acidosis, increased anion gap 10/13/2019   Acute urinary retention 10/13/2019   Weakness 10/05/2019   Acute on chronic diastolic CHF  (congestive heart failure) ([place]) 09/27/2019   Vision loss of left eye 09/21/2019   Hyponatremia 09/19/2019   Homeless 09/19/2019   Anemia of chronic disease 09/19/2019   Vision loss, left eye 09/19/2019   Acute loss of vision, right 06/16/2019   Hypertensive urgency 04/17/2019   Hypokalemia 04/17/2019   Normocytic anemia 04/17/2019   Hyperglycemia    Alcohol use    Gastroparesis due to DM ([place])    Essential hypertension    Hyperlipidemia    Type 2 diabetes mellitus with hypoglycemia ([place]) 06/26/2012   PCP:  [name], MD Pharmacy:   [place], [place] - [address] [place] [address] Phone: [phone] Fax: [phone]     Social Determinants of Health (SDOH) Interventions    Readmission Risk Interventions Readmission Risk Prevention Plan 04/06/2021 02/19/2019  Transportation Screening Complete Complete  PCP or Specialist Appt within 5-7 Days - Complete  Home Care Screening - Complete  Medication Review (RN CM) - Complete  Medication Review (RN Care Manager) Complete -  PCP or Specialist appointment within 3-5 days of discharge Complete -  [place] or Home Care Consult Complete -  SW Recovery Care/Counseling Consult Complete -  Palliative Care Screening Not Applicable -  Skilled Nursing Facility Complete -  Some recent data might be hidden

## 2021-05-08 NOTE — Evaluation (Signed)
Physical Therapy Evaluation Patient Details Name: Awesome Jared MRN: [id number] DOB: [birth year] Today's Date: 05/08/2021  History of Present Illness  Jimi Schappert is a pleasant 50 y.o. male with medical history significant for ESRD on hemodialysis, blindness, HFpEF, hypertension, supplemental oxygen requirement since admission with COVID last month, recent left thoracentesis, and recent treatment for bacterial pneumonia, now presenting to the emergency department with general weakness and fatigue.  Patient complains of progressively worsening fatigue and general weakness over approximately 3 days, no longer to ambulate with his walker due to this.   Clinical Impression  Patient received in bed, reports he is cold. Agrees to PT assessment. Patient is mod independent with bed mobility, transfers with min guard. Ambulated 25 feet with RW and min guard. Mod cues for direction due to blindness. He declines sitting up in recliner, therefore returned to bed.  Patient will continue to benefit from skilled PT while here to improve strength and functional independence.         Recommendations for follow up therapy are one component of a multi-disciplinary discharge planning process, led by the attending physician.  Recommendations may be updated based on patient status, additional functional criteria and insurance authorization.  Follow Up Recommendations No PT follow up    Assistance Recommended at Discharge Intermittent Supervision/Assistance  Functional Status Assessment Patient has had a recent decline in their functional status and demonstrates the ability to make significant improvements in function in a reasonable and predictable amount of time.  Equipment Recommendations  None recommended by PT    Recommendations for Other Services       Precautions / Restrictions Precautions Precaution Comments: mod fall, blind Restrictions Weight Bearing Restrictions: No      Mobility  Bed  Mobility Overal bed mobility: Modified Independent                  Transfers Overall transfer level: Modified independent Equipment used: Rolling walker (2 wheels)               General transfer comment: cues needed due to visual deficits    Ambulation/Gait Ambulation/Gait assistance: Min guard Gait Distance (Feet): 25 Feet Assistive device: Rolling walker (2 wheels) Gait Pattern/deviations: Step-through pattern;Decreased step length - right;Decreased step length - left Gait velocity: decr     General Gait Details: cues and assist needed due to visual deficits.  Stairs            Wheelchair Mobility    Modified Rankin (Stroke Patients Only)       Balance Overall balance assessment: Needs assistance Sitting-balance support: Feet supported Sitting balance-Leahy Scale: Good     Standing balance support: Bilateral upper extremity supported;During functional activity;Reliant on assistive device for balance Standing balance-Leahy Scale: Fair                               Pertinent Vitals/Pain Pain Assessment: No/denies pain    Home Living Family/patient expects to be discharged to:: Skilled nursing facility                        Prior Function Prior Level of Function : Needs assist       Physical Assist : Mobility (physical) Mobility (physical): Transfers;Gait   Mobility Comments: patient needs assist with mobility due to visual deficits.       Hand Dominance        Extremity/Trunk Assessment   Upper Extremity  Assessment Upper Extremity Assessment: Defer to OT evaluation    Lower Extremity Assessment Lower Extremity Assessment: Overall WFL for tasks assessed    Cervical / Trunk Assessment Cervical / Trunk Assessment: Normal  Communication   Communication: No difficulties  Cognition Arousal/Alertness: Awake/alert Behavior During Therapy: WFL for tasks assessed/performed Overall Cognitive Status: Within  Functional Limits for tasks assessed                                          General Comments      Exercises     Assessment/Plan    PT Assessment Patient needs continued PT services  PT Problem List Decreased strength;Decreased mobility;Decreased activity tolerance       PT Treatment Interventions Gait training;Functional mobility training;Therapeutic exercise;Therapeutic activities;Patient/family education    PT Goals (Current goals can be found in the Care Plan section)  Acute Rehab PT Goals Patient Stated Goal: to return to [facility] PT Goal Formulation: With patient Time For Goal Achievement: 05/15/21 Potential to Achieve Goals: Good    Frequency Min 3X/week   Barriers to discharge        Co-evaluation               AM-PAC PT "6 Clicks" Mobility  Outcome Measure Help needed turning from your back to your side while in a flat bed without using bedrails?: None Help needed moving from lying on your back to sitting on the side of a flat bed without using bedrails?: None Help needed moving to and from a bed to a chair (including a wheelchair)?: A Little Help needed standing up from a chair using your arms (e.g., wheelchair or bedside chair)?: A Little Help needed to walk in hospital room?: A Little Help needed climbing 3-5 steps with a railing? : A Little 6 Click Score: 20    End of Session Equipment Utilized During Treatment: Gait belt;Oxygen Activity Tolerance: Patient tolerated treatment well Patient left: in bed;with call bell/phone within reach Nurse Communication: Mobility status PT Visit Diagnosis: Muscle weakness (generalized) (M62.81)    Time: 1683-7290 PT Time Calculation (min) (ACUTE ONLY): 15 min   Charges:   PT Evaluation $PT Eval Moderate Complexity: 1 Mod          Khamya Topp, PT, GCS 05/08/21,12:04 PM

## 2021-05-08 NOTE — Progress Notes (Addendum)
[facility] Progress Note   Subjective:  Completed dialysis yesterday. No reported issues with treatment -  no UF reported  Seen in room on 3L [place].  No complaints other than his back hurts.   Objective Vitals:   05/07/21 1655 05/07/21 1728 05/08/21 0426 05/08/21 0902  BP: 131/72 129/78 136/75 135/78  Pulse: 87 89 83 87  Resp: 14 15 18 17   Temp: 98.4 F (36.9 C) 98.1 F (36.7 C) 98.1 F (36.7 C) 98 F (36.7 C)  TempSrc: Oral Oral Oral Oral  SpO2: 100% 100% 100% 100%  Weight:      Height:         Additional Objective Labs: Basic Metabolic Panel: Recent Labs  Lab 05/06/21 1820 05/07/21 0500 05/08/21 0211  NA 136 134* 134*  K 3.9 3.9 3.3*  CL 96* 99 98  CO2 28 26 28   GLUCOSE 113* 98 146*  BUN 26* 29* 9  CREATININE 5.08* 5.51* 2.83*  CALCIUM 9.4 8.6* 8.2*  PHOS  --  3.5  --    CBC: Recent Labs  Lab 05/06/21 1820 05/07/21 0500 05/08/21 0211  WBC 13.0* 10.7* 10.1  HGB 11.2* 9.4* 9.4*  HCT 35.9* 31.1* 31.3*  MCV 91.6 91.2 91.3  PLT 368 320 307   Blood Culture    Component Value Date/Time   SDES BLOOD RIGHT HAND 10/27/2019 1208   SDES BLOOD RIGHT HAND 10/27/2019 1208   SPECREQUEST  10/27/2019 1208    BOTTLES DRAWN AEROBIC AND ANAEROBIC Blood Culture adequate volume   SPECREQUEST  10/27/2019 1208    BOTTLES DRAWN AEROBIC AND ANAEROBIC Blood Culture adequate volume   CULT  10/27/2019 1208    NO GROWTH 5 DAYS Performed at [facility], [place] [address] [place], [place] [address]    CULT  10/27/2019 1208    NO GROWTH 5 DAYS Performed at [facility], [place] [address] [place], [place] [address]    REPTSTATUS 11/01/2019 FINAL 10/27/2019 1208   REPTSTATUS 11/01/2019 FINAL 10/27/2019 1208     Physical Exam General: Alert, sitting up in bed, on nasal oxygen, nad Heart: RRR No m,r,g  Lungs: Clear bilaterally  Abdomen: soft non -tender  Extremities: No LE edema  Dialysis Access: RUE AVF +bruit   Medications:  azithromycin 500  mg (05/07/21 2230)   cefTRIAXone (ROCEPHIN)  IV Stopped (05/07/21 1009)    amLODipine  5 mg Oral Daily   aspirin EC  81 mg Oral Daily   atorvastatin  40 mg Oral Daily   atropine  1 drop Both Eyes BID   brimonidine  1 drop Left Eye BID   calcium acetate  667 mg Oral TID WC   Chlorhexidine Gluconate Cloth  6 each Topical Q0600   dorzolamide-timolol  1 drop Left Eye BID   heparin  5,000 Units Subcutaneous Q8H   latanoprost  1 drop Left Eye QHS   losartan  100 mg Oral Daily   melatonin  10 mg Oral QHS   olopatadine  2 drop Both Eyes Daily   polyvinyl alcohol  1 drop Both Eyes TID   prednisoLONE acetate  1 drop Both Eyes TID AC & HS   pregabalin  25 mg Oral BID   senna  2 tablet Oral QHS   sertraline  25 mg Oral Daily   sodium chloride flush  3 mL Intravenous Q12H   torsemide  80 mg Oral BID   vancomycin variable dose per unstable renal function (pharmacist dosing)  Does not apply See admin instructions   vitamin B-12  1,000 mcg Oral Daily    Dialysis Orders:  Center: [facility]  on MWF . 180NRe 4 hours BFR 400 DFR 600 EDW 54kg 2K 2Ca AVF15g heparin 3000 unit bolus Mircera 122mcg IV q 2 weeks- last dose 04/30/21 Hectorol 1 mcg IV q HD Calcium acetate 1 tabs PO q meal  Assessment/Plan: Pneumonia: Ongoing SOB despite recent thoracentesis and antibiotics. Started on vancomycin, rocephin and azithromycin. Management per primary team  ESRD:  Dialysis on MWF schedule. On a holiday schedule this week due to Thanksgiving with MWF patients dialyzing on Sunday, Tuesday, Friday. However since patient had not had HD since Friday, we will keep him on his regular schedule this week. Had HD here Monday. Next HD 11/23. Added K+ bath  Hypertension: BP well controlled Stopped hydralazine and lower amlodipine to 5. Continue to challenge EDW as tolerated. No post HD wt recorded on 11/21.  Get standing weights next HD  Chronic diastolic CHF: With bilateral pleural effusions noted on CT and recent  thoracentesis. He is above his EDW by weights here. Will UF with HD as tolerated which may also improve his respiratory status somewhat.  Still makes some urine, continue torsemide.   Anemia: Hgb 9.4 Not due for ESA yet. Continue to monitor trend.   Metabolic bone disease: Calcium and phos controlled. Continue hectorol and calcium acetate.   Nutrition:  Renal diet with fluid restrictions. Will check albumin with next labs.   Lynnda Child PA-C [facility] 05/08/2021,9:21 AM  Patient seen and examined, agree with above note with above modifications. Looks like he feels better but still with big O2 requirement.  HD tomorrow and continue to lower EDW as able- also continue with titration of dialysis related meds  Corliss Parish, MD 05/08/2021

## 2021-05-08 NOTE — Evaluation (Signed)
Occupational Therapy Evaluation Patient Details Name: Christopher Davila MRN: [id number] DOB: November 27, 1970 Today's Date: 05/08/2021   History of Present Illness Robyn Nohr is a pleasant 50 y.o. male with medical history significant for ESRD on hemodialysis, blindness, HFpEF, hypertension, supplemental oxygen requirement since admission with COVID last month, recent left thoracentesis, and recent treatment for bacterial pneumonia, now presenting to the emergency department with general weakness and fatigue.  Patient complains of progressively worsening fatigue and general weakness over approximately 3 days, no longer to ambulate with his walker due to this.   Clinical Impression   Pt reported coming from SNF level of care. Pt was able to complete bed mobility with mod I, ambulation with RW and min guard with moderate cues as pt reports completely blind and set up with UE ADLS to know where items are located. Pt currently with functional limitations due to the deficits listed below (see OT Problem List).  Pt will benefit from skilled OT to increase their safety and independence with ADL and functional mobility for ADL to facilitate discharge to venue listed below.        Recommendations for follow up therapy are one component of a multi-disciplinary discharge planning process, led by the attending physician.  Recommendations may be updated based on patient status, additional functional criteria and insurance authorization.   Follow Up Recommendations  No OT follow up    Assistance Recommended at Discharge Intermittent Supervision/Assistance  Functional Status Assessment  Patient has had a recent decline in their functional status and demonstrates the ability to make significant improvements in function in a reasonable and predictable amount of time.  Equipment Recommendations       Recommendations for Other Services       Precautions / Restrictions Precautions Precautions: Fall Precaution  Comments: mod fall, blind Restrictions Weight Bearing Restrictions: No      Mobility Bed Mobility Overal bed mobility: Modified Independent             General bed mobility comments: cues due to different bed and blind    Transfers Overall transfer level: Modified independent Equipment used: Rolling walker (2 wheels)               General transfer comment: cues needed due to visual deficits      Balance Overall balance assessment: Needs assistance Sitting-balance support: Feet supported Sitting balance-Leahy Scale: Good     Standing balance support: Bilateral upper extremity supported;During functional activity;Reliant on assistive device for balance Standing balance-Leahy Scale: Fair                             ADL either performed or assessed with clinical judgement   ADL Overall ADL's : Needs assistance/impaired Eating/Feeding: Modified independent;Sitting   Grooming: Wash/dry hands;Wash/dry face;Modified independent;Cueing for safety;Cueing for sequencing;Sitting   Upper Body Bathing: Modified independent;Sitting   Lower Body Bathing: Set up;Cueing for safety;Cueing for sequencing;Sit to/from stand   Upper Body Dressing : Modified independent;Cueing for safety;Cueing for sequencing;Sitting   Lower Body Dressing: Set up;Sit to/from stand   Toilet Transfer: Min guard;Cueing for safety;Cueing for sequencing   Toileting- Water quality scientist and Hygiene: Min guard;Cueing for safety;Cueing for sequencing;Sit to/from stand       Functional mobility during ADLs: Min guard;Cueing for safety;Cueing for sequencing;Rolling walker (2 wheels)       Vision Baseline Vision/History: 2 Legally blind Ability to See in Adequate Light: 4 Severely impaired Patient Visual Report: No change from baseline  Vision Assessment?:  (pt reports completly blind)     Perception     Praxis      Pertinent Vitals/Pain Pain Assessment: No/denies pain      Hand Dominance     Extremity/Trunk Assessment Upper Extremity Assessment Upper Extremity Assessment: Overall WFL for tasks assessed   Lower Extremity Assessment Lower Extremity Assessment: Defer to PT evaluation   Cervical / Trunk Assessment Cervical / Trunk Assessment: Normal   Communication Communication Communication: No difficulties   Cognition Arousal/Alertness: Awake/alert Behavior During Therapy: WFL for tasks assessed/performed Overall Cognitive Status: Within Functional Limits for tasks assessed                                       General Comments       Exercises     Shoulder Instructions      Home Living Family/patient expects to be discharged to:: Skilled nursing facility                                        Prior Functioning/Environment Prior Level of Function : Needs assist       Physical Assist : Mobility (physical) Mobility (physical): Transfers;Gait   Mobility Comments: patient needs assist with mobility due to visual deficits.          OT Problem List: Decreased activity tolerance;Decreased safety awareness;Decreased knowledge of use of DME or AE      OT Treatment/Interventions: Self-care/ADL training;DME and/or AE instruction;Therapeutic activities;Visual/perceptual remediation/compensation;Patient/family education;Balance training    OT Goals(Current goals can be found in the care plan section) Acute Rehab OT Goals Patient Stated Goal: to rest OT Goal Formulation: With patient Time For Goal Achievement: 05/26/21 Potential to Achieve Goals: Good ADL Goals Pt Will Transfer to Toilet: with modified independence;ambulating Pt Will Perform Tub/Shower Transfer: Shower transfer;with modified independence;shower seat;ambulating;grab bars Additional ADL Goal #1: Pt will tolerate 20 mins of activity to be able to complete showers with return to home  OT Frequency: Min 2X/week   Barriers to D/C:             Co-evaluation PT/OT/SLP Co-Evaluation/Treatment: Yes Reason for Co-Treatment: Complexity of the patient's impairments (multi-system involvement)   OT goals addressed during session: ADL's and self-care      AM-PAC OT "6 Clicks" Daily Activity     Outcome Measure Help from another person eating meals?: None Help from another person taking care of personal grooming?: None Help from another person toileting, which includes using toliet, bedpan, or urinal?: A Little Help from another person bathing (including washing, rinsing, drying)?: A Little Help from another person to put on and taking off regular upper body clothing?: None Help from another person to put on and taking off regular lower body clothing?: A Little 6 Click Score: 21   End of Session Equipment Utilized During Treatment: Gait belt;Rolling walker (2 wheels)  Activity Tolerance: Patient tolerated treatment well Patient left: in bed;with call bell/phone within reach;with bed alarm set  OT Visit Diagnosis: Unsteadiness on feet (R26.81);Other abnormalities of gait and mobility (R26.89)                Time: 6269-4854 OT Time Calculation (min): 20 min Charges:  OT General Charges $OT Visit: 1 Visit OT Evaluation $OT Eval Low Complexity: 1 Low  Joeseph Amor OTR/L  Acute Rehab  Services  [phone] office number [phone] pager number   Joeseph Amor 05/08/2021, 12:46 PM

## 2021-05-08 NOTE — Progress Notes (Signed)
PROGRESS NOTE    Christopher Davila  VFI:[id number] DOB: 08-01-1970 DOA: 05/06/2021 PCP: [name], MD   Brief Narrative: 50 year old with past medical history significant for ESRD on hemodialysis, blindness, heart failure preserved ejection fraction, hypertension, chronic supplemental oxygen requirement since admission with COVID last month, recent left thoracentesis and recent treatment for bacterial pneumonia, presents to the ED with generalized weakness and fatigue.  He has not been able to ambulate with his walker.  He has been treated with 1 g intramuscular of Rocephin and Levaquin at a skilled nursing facility completed approximately 1 week ago.  He underwent left thoracentesis 05/03/2021, not exudative by lights criteria.  Elevation in the ED patient was afebrile oxygen sat 90 on 3 L, mildly tachypneic.  CT chest showed patchy bilateral consolidation consistent with multifocal pneumonia as well as bilateral pleural effusion.  CT abdomen and pelvis negative for acute abnormality.  White blood cell 13.  COVID and influenza negative.   Assessment & Plan:   Principal Problem:   Multifocal pneumonia Active Problems:   Heart failure with preserved ejection fraction (HCC)   Acute respiratory failure with hypoxia (HCC)   End-stage renal disease on hemodialysis (HCC)   1-Multifocal Pneumonia, Acute on chronic Hypoxic Respiratory Failure: -CT chest: Patchy consolidation and bilateral effusion right greater than left similar to that seen on prior CT.  This is again consistent with multifocal pneumonia. -Presented with leukocytosis. -Continue with IV ceftriaxone and azithromycin. -MRSA PCR negative , discontinue Vancomycin.   Generalized weakness, fatigue,  Patient reports unable to move lower extremities, he has been in bed for over the weekend.  He was out at the facility. -Plan to check MRI brain: No evidence of recent infarction, hemorrhage or mass. -B12: 291.  Started  supplement -TSH: 2.6 -His LE weakness is improved today.  Suspect related to acute illness.  Continue with PT  Bilateral pleural effusion: Recently evaluated by pulmonology for this suspected to be transudative fluid from left pleural fluid from 05/27/2016 was transudative according to lights criteria Volume managed with hemodialysis  Chronic diastolic heart failure: Appears compensated.  Volume managed with hemodialysis.  Hypertension: Continue with Norvasc, hydralazine, losartan. Hypokalemia; correction with HD.   ESRD on hemodialysis Patient gets hemodialysis Monday Wednesday and Friday. Plan for hemodialysis today.  Nephrology consulted.  Hyponatremia; Volume manage with HD.    Estimated body mass index is 23.03 kg/m as calculated from the following:   Height as of this encounter: 5\' 3"  (1.6 m).   Weight as of this encounter: 59 kg.   DVT prophylaxis: Heparin Code Status: Full code Family Communication: Care discussed with patient Disposition Plan:  Status is: Inpatient  Remains inpatient appropriate because: Patient admitted with generalized weakness fatigue, inability to ambulate. SNF in 24--48 hours.         Consultants:  None Procedures:  None  Antimicrobials:    Subjective: He is feeling less weak. He was able to raise and move LE better.  He relates some back pain, maybe how he slept in the bed.   Objective: Vitals:   05/07/21 1655 05/07/21 1728 05/08/21 0426 05/08/21 0902  BP: 131/72 129/78 136/75 135/78  Pulse: 87 89 83 87  Resp: 14 15 18 17   Temp: 98.4 F (36.9 C) 98.1 F (36.7 C) 98.1 F (36.7 C) 98 F (36.7 C)  TempSrc: Oral Oral Oral Oral  SpO2: 100% 100% 100% 100%  Weight:      Height:        Intake/Output Summary (  Last 24 hours) at 05/08/2021 1644 Last data filed at 05/08/2021 1600 Gross per 24 hour  Intake 1550 ml  Output 1025 ml  Net 525 ml    Filed Weights   05/06/21 1802  Weight: 59 kg    Examination:  General  exam: NAD Respiratory system: CTA Cardiovascular system: S 1, S 2 RRR Gastrointestinal system: BS present, soft , nt Central nervous system: non focal.  Extremities: no edema    Data Reviewed: I have personally reviewed following labs and imaging studies  CBC: Recent Labs  Lab 05/06/21 1820 05/07/21 0500 05/08/21 0211  WBC 13.0* 10.7* 10.1  HGB 11.2* 9.4* 9.4*  HCT 35.9* 31.1* 31.3*  MCV 91.6 91.2 91.3  PLT 368 320 591    Basic Metabolic Panel: Recent Labs  Lab 05/06/21 1820 05/07/21 0500 05/08/21 0211  NA 136 134* 134*  K 3.9 3.9 3.3*  CL 96* 99 98  CO2 28 26 28   GLUCOSE 113* 98 146*  BUN 26* 29* 9  CREATININE 5.08* 5.51* 2.83*  CALCIUM 9.4 8.6* 8.2*  PHOS  --  3.5  --     GFR: Estimated Creatinine Clearance: 25.4 mL/min (A) (by C-G formula based on SCr of 2.83 mg/dL (H)). Liver Function Tests: Recent Labs  Lab 05/06/21 1820  AST 17  ALT 13  ALKPHOS 98  BILITOT 0.3  PROT 7.1  ALBUMIN 3.1*    Recent Labs  Lab 05/06/21 1820  LIPASE 26    No results for input(s): AMMONIA in the last 168 hours. Coagulation Profile: No results for input(s): INR, PROTIME in the last 168 hours. Cardiac Enzymes: Recent Labs  Lab 05/06/21 2351  CKTOTAL 84    BNP (last 3 results) No results for input(s): PROBNP in the last 8760 hours. HbA1C: No results for input(s): HGBA1C in the last 72 hours. CBG: Recent Labs  Lab 05/07/21 1200 05/07/21 1750 05/07/21 2018 05/08/21 0638  GLUCAP 145* 76 213* 62*   Lipid Profile: No results for input(s): CHOL, HDL, LDLCALC, TRIG, CHOLHDL, LDLDIRECT in the last 72 hours. Thyroid Function Tests: Recent Labs    05/06/21 2355  TSH 2.649    Anemia Panel: Recent Labs    05/07/21 0500  VITAMINB12 291  FOLATE 9.1    Sepsis Labs: Recent Labs  Lab 05/06/21 2351 05/07/21 0500 05/08/21 0211  PROCALCITON 0.12 0.14 1.01     Recent Results (from the past 240 hour(s))  Body Fluid Culture     Status: None    Collection Time: 05/03/21 11:53 AM   Specimen: Pleural; Body Fluid   Body Fluid  Result Value Ref Range Status   Body Fluid Culture, Sterile Final report  Final   Organism ID, Bacteria Comment  Final    Comment: No growth in 56 - 72 hours.  Resp Panel by RT-PCR (Flu A&B, Covid) Nasopharyngeal Swab     Status: None   Collection Time: 05/06/21  6:25 PM   Specimen: Nasopharyngeal Swab; Nasopharyngeal(NP) swabs in vial transport medium  Result Value Ref Range Status   SARS Coronavirus 2 by RT PCR NEGATIVE NEGATIVE Final    Comment: (NOTE) SARS-CoV-2 target nucleic acids are NOT DETECTED.  The SARS-CoV-2 RNA is generally detectable in upper respiratory specimens during the acute phase of infection. The lowest concentration of SARS-CoV-2 viral copies this assay can detect is 138 copies/mL. A negative result does not preclude SARS-Cov-2 infection and should not be used as the sole basis for treatment or other patient management decisions. A negative  result may occur with  improper specimen collection/handling, submission of specimen other than nasopharyngeal swab, presence of viral mutation(s) within the areas targeted by this assay, and inadequate number of viral copies(<138 copies/mL). A negative result must be combined with clinical observations, patient history, and epidemiological information. The expected result is Negative.  Fact Sheet for Patients:  EntrepreneurPulse.com.au  Fact Sheet for Healthcare Providers:  IncredibleEmployment.be  This test is no t yet approved or cleared by the [place] FDA and  has been authorized for detection and/or diagnosis of SARS-CoV-2 by FDA under an Emergency Use Authorization (EUA). This EUA will remain  in effect (meaning this test can be used) for the duration of the COVID-19 declaration under Section 564(b)(1) of the Act, 21 U.S.C.section 360bbb-3(b)(1), unless the authorization is terminated  or  revoked sooner.       Influenza A by PCR NEGATIVE NEGATIVE Final   Influenza B by PCR NEGATIVE NEGATIVE Final    Comment: (NOTE) The Xpert Xpress SARS-CoV-2/FLU/RSV plus assay is intended as an aid in the diagnosis of influenza from Nasopharyngeal swab specimens and should not be used as a sole basis for treatment. Nasal washings and aspirates are unacceptable for Xpert Xpress SARS-CoV-2/FLU/RSV testing.  Fact Sheet for Patients: EntrepreneurPulse.com.au  Fact Sheet for Healthcare Providers: IncredibleEmployment.be  This test is not yet approved or cleared by the [place] FDA and has been authorized for detection and/or diagnosis of SARS-CoV-2 by FDA under an Emergency Use Authorization (EUA). This EUA will remain in effect (meaning this test can be used) for the duration of the COVID-19 declaration under Section 564(b)(1) of the Act, 21 U.S.C. section 360bbb-3(b)(1), unless the authorization is terminated or revoked.  Performed at [facility], [place] [address], [place], [place] [address]   MRSA Next Gen by PCR, Nasal     Status: None   Collection Time: 05/08/21  5:36 AM   Specimen: Nasal Mucosa; Nasal Swab  Result Value Ref Range Status   MRSA by PCR Next Gen NOT DETECTED NOT DETECTED Final    Comment: (NOTE) The GeneXpert MRSA Assay (FDA approved for NASAL specimens only), is one component of a comprehensive MRSA colonization surveillance program. It is not intended to diagnose MRSA infection nor to guide or monitor treatment for MRSA infections. Test performance is not FDA approved in patients less than 74 years old. Performed at [facility], [place] [address] [place], [place] [address]           Radiology Studies: DG Chest 2 View  Result Date: 05/06/2021 CLINICAL DATA:  Status post thoracentesis EXAM: CHEST - 2 VIEW COMPARISON:  05/03/2021 FINDINGS: The lungs are symmetrically well expanded. Small left  pleural effusion is present, decreased in size since prior examination and partially loculated laterally. Small left pleural effusion is unchanged. Mild superimposed bilateral perihilar and lower lung zone interstitial infiltrate persists, asymmetrically more severe on the left, edema versus infection. Cardiac size is mildly enlarged, unchanged. No pneumothorax. IMPRESSION: Interval right thoracentesis. Small residual right pleural effusion, partially laterally loculated. No pneumothorax. Stable small left pleural effusion. Mild perihilar and lower lung zone interstitial pulmonary infiltrate, asymmetrically more severe on the left, edema versus infection. Electronically Signed   By: Fidela Salisbury M.D.   On: 05/06/2021 19:44   MR BRAIN WO CONTRAST  Result Date: 05/07/2021 CLINICAL DATA:  Delirium EXAM: MRI HEAD WITHOUT CONTRAST TECHNIQUE: Multiplanar, multiecho pulse sequences of the brain and surrounding structures were obtained without intravenous contrast. COMPARISON:  None.  FINDINGS: Brain: There is no acute infarction or intracranial hemorrhage. There is no intracranial mass, mass effect, or edema. There is no hydrocephalus or extra-axial fluid collection. Prominence of the ventricles and sulci reflects parenchymal volume loss greater than expected for age. Chronic left basal ganglia infarct with blood products. Additional patchy and confluent T2 hyperintensity in the supratentorial and pontine white matter is nonspecific but may reflect moderate chronic microvascular ischemic changes. Vascular: Major vessel flow voids at the skull base are preserved. Skull and upper cervical spine: Normal marrow signal is preserved. Sinuses/Orbits: Mild mucosal thickening. Abnormal signal within both globes likely reflecting proteinaceous material as seen on prior CT imaging. Other: Sella is unremarkable.  Mastoid air cells are clear. IMPRESSION: No evidence of recent infarction, hemorrhage, or mass. Chronic/nonemergent  findings detailed above. Parenchymal volume loss and chronic microvascular ischemic changes are age advanced. Electronically Signed   By: Macy Mis M.D.   On: 05/07/2021 11:44   CT CHEST ABDOMEN PELVIS W CONTRAST  Result Date: 05/06/2021 CLINICAL DATA:  Weakness and abdominal pain, initial encounter EXAM: CT CHEST, ABDOMEN, AND PELVIS WITH CONTRAST TECHNIQUE: Multidetector CT imaging of the chest, abdomen and pelvis was performed following the standard protocol during bolus administration of intravenous contrast. CONTRAST:  101mL ISOVUE-300 IOPAMIDOL (ISOVUE-300) INJECTION 61% COMPARISON:  Chest x-ray from earlier in the same day, CT from 03/30/2021. FINDINGS: CT CHEST FINDINGS Cardiovascular: Thoracic aorta demonstrates a normal branching pattern. Mild atherosclerotic calcifications are noted. No aneurysmal dilatation or dissection is noted. Pulmonary artery is within normal limits without evidence of embolus although timing was not performed for embolus evaluation. Mild coronary calcifications are noted. Mediastinum/Nodes: No mediastinal or hilar adenopathy is noted. The esophagus as visualized is within normal limits. The thoracic inlet is unremarkable. Lungs/Pleura: Bilateral pleural effusions are noted right considerably greater than left. Patchy consolidation is noted consistent with multifocal pneumonia within the lower lobes bilaterally. These changes are relatively stable from prior CT from 03/30/2021. No new parenchymal nodule is noted. The ground-glass attenuation has nearly completely resolved when compared with the prior CT. Musculoskeletal: No acute rib abnormality is noted. No acute bony abnormality is seen. CT ABDOMEN PELVIS FINDINGS Hepatobiliary: Gallbladder is well distended with dependent gallstones. Liver is within normal limits. No obstructive changes are seen. Pancreas: Unremarkable. No pancreatic ductal dilatation or surrounding inflammatory changes. Spleen: Normal in size without  focal abnormality. Adrenals/Urinary Tract: Adrenal glands are within normal limits. Kidneys demonstrate a normal enhancement pattern bilaterally. Bladder is well distended. Stomach/Bowel: No obstructive or inflammatory changes of the colon are seen. Appendix is not well visualized. No inflammatory changes to suggest appendicitis are seen. Small bowel and stomach are within normal limits. Vascular/Lymphatic: Aortic atherosclerosis. No enlarged abdominal or pelvic lymph nodes. Reproductive: Prostate is unremarkable. Other: No abdominal wall hernia or abnormality. No abdominopelvic ascites. Musculoskeletal: No acute or significant osseous findings. IMPRESSION: CT of the chest: Patchy consolidation and bilateral effusions right greater than left similar to that seen on prior CT. This is again consistent with multifocal pneumonia. No new focal abnormality is noted. CT of the abdomen and pelvis: Cholelithiasis without complicating factors. No other focal abnormality is seen. Electronically Signed   By: Inez Catalina M.D.   On: 05/06/2021 20:28        Scheduled Meds:  amLODipine  5 mg Oral Daily   aspirin EC  81 mg Oral Daily   atorvastatin  40 mg Oral Daily   atropine  1 drop Both Eyes BID   brimonidine  1 drop Left Eye BID   calcium acetate  667 mg Oral TID WC   Chlorhexidine Gluconate Cloth  6 each Topical Q0600   dorzolamide-timolol  1 drop Left Eye BID   heparin  5,000 Units Subcutaneous Q8H   latanoprost  1 drop Left Eye QHS   losartan  100 mg Oral Daily   melatonin  10 mg Oral QHS   olopatadine  2 drop Both Eyes Daily   polyvinyl alcohol  1 drop Both Eyes TID   prednisoLONE acetate  1 drop Both Eyes TID AC & HS   pregabalin  25 mg Oral BID   senna  2 tablet Oral QHS   sertraline  25 mg Oral Daily   sodium chloride flush  3 mL Intravenous Q12H   torsemide  80 mg Oral BID   vitamin B-12  1,000 mcg Oral Daily   Continuous Infusions:  azithromycin 500 mg (05/07/21 2230)   cefTRIAXone  (ROCEPHIN)  IV 2 g (05/08/21 1048)     LOS: 2 days    Time spent: 35 minutes    Joby Hershkowitz A Missy Baksh, MD [facility]   If 7PM-7AM, please contact night-coverage [email or web address]  05/08/2021, 4:44 PM

## 2021-05-09 DIAGNOSIS — J189 Pneumonia, unspecified organism: Secondary | ICD-10-CM | POA: Diagnosis not present

## 2021-05-09 LAB — CBC
HCT: 29.4 % — ABNORMAL LOW (ref 39.0–52.0)
Hemoglobin: 9 g/dL — ABNORMAL LOW (ref 13.0–17.0)
MCH: 27.6 pg (ref 26.0–34.0)
MCHC: 30.6 g/dL (ref 30.0–36.0)
MCV: 90.2 fL (ref 80.0–100.0)
Platelets: 285 10*3/uL (ref 150–400)
RBC: 3.26 MIL/uL — ABNORMAL LOW (ref 4.22–5.81)
RDW: 17.1 % — ABNORMAL HIGH (ref 11.5–15.5)
WBC: 10.8 10*3/uL — ABNORMAL HIGH (ref 4.0–10.5)
nRBC: 0 % (ref 0.0–0.2)

## 2021-05-09 LAB — BASIC METABOLIC PANEL
Anion gap: 9 (ref 5–15)
BUN: 19 mg/dL (ref 6–20)
CO2: 28 mmol/L (ref 22–32)
Calcium: 8.5 mg/dL — ABNORMAL LOW (ref 8.9–10.3)
Chloride: 98 mmol/L (ref 98–111)
Creatinine, Ser: 4.13 mg/dL — ABNORMAL HIGH (ref 0.61–1.24)
GFR, Estimated: 17 mL/min — ABNORMAL LOW (ref >60)
Glucose, Bld: 145 mg/dL — ABNORMAL HIGH (ref 70–99)
Potassium: 3.5 mmol/L (ref 3.5–5.1)
Sodium: 135 mmol/L (ref 135–145)

## 2021-05-09 MED ORDER — WHITE PETROLATUM EX OINT
TOPICAL_OINTMENT | CUTANEOUS | Status: AC
  Filled 2021-05-09: qty 28.35

## 2021-05-09 MED ORDER — CHLORHEXIDINE GLUCONATE CLOTH 2 % EX PADS
6.0000 | MEDICATED_PAD | Freq: Every day | CUTANEOUS | Status: DC
  Administered 2021-05-10: 6 via TOPICAL

## 2021-05-09 MED ORDER — LIDOCAINE HCL (PF) 1 % IJ SOLN: 5.0000 mL | INTRAMUSCULAR | Status: DC | PRN

## 2021-05-09 MED ORDER — HEPARIN SODIUM (PORCINE) 1000 UNIT/ML DIALYSIS: 1000.0000 [IU] | INTRAMUSCULAR | Status: DC | PRN

## 2021-05-09 MED ORDER — ALTEPLASE 2 MG IJ SOLR: 2.0000 mg | Freq: Once | INTRAMUSCULAR | Status: DC | PRN

## 2021-05-09 MED ORDER — DIPHENHYDRAMINE HCL 25 MG PO CAPS
25.0000 mg | ORAL_CAPSULE | Freq: Once | ORAL | Status: AC | PRN
  Administered 2021-05-09: 25 mg via ORAL
  Filled 2021-05-09: qty 1

## 2021-05-09 MED ORDER — LEVOFLOXACIN 500 MG PO TABS
500.0000 mg | ORAL_TABLET | ORAL | Status: DC
  Administered 2021-05-09: 500 mg via ORAL
  Filled 2021-05-09: qty 1

## 2021-05-09 MED ORDER — PENTAFLUOROPROP-TETRAFLUOROETH EX AERO: 1.0000 "application " | INHALATION_SPRAY | CUTANEOUS | Status: DC | PRN

## 2021-05-09 MED ORDER — LIDOCAINE-PRILOCAINE 2.5-2.5 % EX CREA: 1.0000 "application " | TOPICAL_CREAM | CUTANEOUS | Status: DC | PRN

## 2021-05-09 MED ORDER — SODIUM CHLORIDE 0.9 % IV SOLN: 100.0000 mL | INTRAVENOUS | Status: DC | PRN

## 2021-05-09 MED ORDER — OXYCODONE-ACETAMINOPHEN 5-325 MG PO TABS
2.0000 | ORAL_TABLET | Freq: Once | ORAL | Status: AC
  Administered 2021-05-09: 2 via ORAL
  Filled 2021-05-09: qty 2

## 2021-05-09 MED ORDER — HEPARIN SODIUM (PORCINE) 1000 UNIT/ML DIALYSIS: 300.0000 [IU] | Freq: Once | INTRAMUSCULAR | Status: DC

## 2021-05-09 NOTE — Progress Notes (Addendum)
[facility] Progress Note   Subjective:   Seen in HD unit, getting started on dialysis. Reports he is feeling better and weakness is improving. Denies SOB, CP, palpitations, dizziness.   Objective Vitals:   05/09/21 0600 05/09/21 0812 05/09/21 0818 05/09/21 0830  BP: (!) 147/80 (!) 157/88 (!) 152/83 124/72  Pulse: 76 88    Resp: 18 17 20 12   Temp: 98 F (36.7 C) (!) 97.1 F (36.2 C)    TempSrc:  Temporal    SpO2: 96%     Weight:  54.9 kg    Height:       Physical Exam General: Alert male in NAD Heart: RRR, no murmurs, rubs or gallops Lungs: CTA bilaterally, on O2 via [place], respirations unlabored Abdomen: Soft, non-distended, +BS Extremities: No edema b/l lower extremities Dialysis Access:  RUE AVF, cannulating  Additional Objective Labs: Basic Metabolic Panel: Recent Labs  Lab 05/07/21 0500 05/08/21 0211 05/09/21 0229  NA 134* 134* 135  K 3.9 3.3* 3.5  CL 99 98 98  CO2 26 28 28   GLUCOSE 98 146* 145*  BUN 29* 9 19  CREATININE 5.51* 2.83* 4.13*  CALCIUM 8.6* 8.2* 8.5*  PHOS 3.5  --   --    Liver Function Tests: Recent Labs  Lab 05/06/21 1820  AST 17  ALT 13  ALKPHOS 98  BILITOT 0.3  PROT 7.1  ALBUMIN 3.1*   Recent Labs  Lab 05/06/21 1820  LIPASE 26   CBC: Recent Labs  Lab 05/06/21 1820 05/07/21 0500 05/08/21 0211 05/09/21 0229  WBC 13.0* 10.7* 10.1 10.8*  HGB 11.2* 9.4* 9.4* 9.0*  HCT 35.9* 31.1* 31.3* 29.4*  MCV 91.6 91.2 91.3 90.2  PLT 368 320 307 285   Blood Culture    Component Value Date/Time   SDES BLOOD RIGHT HAND 10/27/2019 1208   SDES BLOOD RIGHT HAND 10/27/2019 1208   SPECREQUEST  10/27/2019 1208    BOTTLES DRAWN AEROBIC AND ANAEROBIC Blood Culture adequate volume   SPECREQUEST  10/27/2019 1208    BOTTLES DRAWN AEROBIC AND ANAEROBIC Blood Culture adequate volume   CULT  10/27/2019 1208    NO GROWTH 5 DAYS Performed at [facility] [address], [place], [place] [address]    CULT  10/27/2019 1208     NO GROWTH 5 DAYS Performed at [facility], [place] [address], [place],  [address]    REPTSTATUS 11/01/2019 FINAL 10/27/2019 1208   REPTSTATUS 11/01/2019 FINAL 10/27/2019 1208    Cardiac Enzymes: Recent Labs  Lab 05/06/21 2351  CKTOTAL 84   CBG: Recent Labs  Lab 05/07/21 1200 05/07/21 1750 05/07/21 2018 05/08/21 0638  GLUCAP 145* 76 213* 62*   Iron Studies: No results for input(s): IRON, TIBC, TRANSFERRIN, FERRITIN in the last 72 hours. @lablastinr3 @ Studies/Results: MR BRAIN WO CONTRAST  Result Date: 05/07/2021 CLINICAL DATA:  Delirium EXAM: MRI HEAD WITHOUT CONTRAST TECHNIQUE: Multiplanar, multiecho pulse sequences of the brain and surrounding structures were obtained without intravenous contrast. COMPARISON:  None. FINDINGS: Brain: There is no acute infarction or intracranial hemorrhage. There is no intracranial mass, mass effect, or edema. There is no hydrocephalus or extra-axial fluid collection. Prominence of the ventricles and sulci reflects parenchymal volume loss greater than expected for age. Chronic left basal ganglia infarct with blood products. Additional patchy and confluent T2 hyperintensity in the supratentorial and pontine white matter is nonspecific but may reflect moderate chronic microvascular ischemic changes. Vascular: Major vessel flow voids at the skull base  are preserved. Skull and upper cervical spine: Normal marrow signal is preserved. Sinuses/Orbits: Mild mucosal thickening. Abnormal signal within both globes likely reflecting proteinaceous material as seen on prior CT imaging. Other: Sella is unremarkable.  Mastoid air cells are clear. IMPRESSION: No evidence of recent infarction, hemorrhage, or mass. Chronic/nonemergent findings detailed above. Parenchymal volume loss and chronic microvascular ischemic changes are age advanced. Electronically Signed   By: Macy Mis M.D.   On: 05/07/2021 11:44   Medications:  [START ON 05/10/2021] sodium  chloride     [START ON 05/10/2021] sodium chloride     azithromycin 500 mg (05/08/21 2238)   cefTRIAXone (ROCEPHIN)  IV 2 g (05/08/21 1048)    amLODipine  5 mg Oral Daily   aspirin EC  81 mg Oral Daily   atorvastatin  40 mg Oral Daily   atropine  1 drop Both Eyes BID   brimonidine  1 drop Left Eye BID   calcium acetate  667 mg Oral TID WC   Chlorhexidine Gluconate Cloth  6 each Topical Q0600   dorzolamide-timolol  1 drop Left Eye BID   [START ON 05/10/2021] heparin  300 Units Dialysis Once in dialysis   heparin  5,000 Units Subcutaneous Q8H   latanoprost  1 drop Left Eye QHS   losartan  100 mg Oral Daily   melatonin  10 mg Oral QHS   olopatadine  2 drop Both Eyes Daily   polyvinyl alcohol  1 drop Both Eyes TID   prednisoLONE acetate  1 drop Both Eyes TID AC & HS   pregabalin  25 mg Oral BID   senna  2 tablet Oral QHS   sertraline  25 mg Oral Daily   sodium chloride flush  3 mL Intravenous Q12H   torsemide  80 mg Oral BID   vitamin B-12  1,000 mcg Oral Daily    Dialysis Orders: Center: [facility]  on MWF . 180NRe 4 hours BFR 400 DFR 600 EDW 54kg 2K 2Ca AVF15g heparin 3000 unit bolus Mircera 183mcg IV q 2 weeks- last dose 04/30/21 Hectorol 1 mcg IV q HD Calcium acetate 1 tabs PO q meal  Assessment/Plan: Pneumonia: Ongoing SOB despite recent thoracentesis and antibiotics. Started on vancomycin, rocephin and azithromycin. Symptomatically improved. Management per primary team  ESRD:  Dialysis on MWF schedule. On a holiday schedule this week due to Thanksgiving with MWF patients dialyzing on Sunday, Tuesday, Friday. However since patient had not had HD since Friday, we kept him on his regular schedule this week. HD today, next treatment will be Friday ( here)  or Saturday if discharged  to the OP unit  Hypertension: BP well controlled Stopped hydralazine and lower amlodipine to 5. Continue to challenge EDW as tolerated. No post HD wt recorded on 11/21.  Get standing weights today.   Chronic diastolic CHF: With bilateral pleural effusions noted on CT and recent thoracentesis. He is above his EDW by weights here. Will UF with HD as tolerated which may also improve his respiratory status somewhat.  Still makes some urine, continue torsemide.   Anemia: Hgb 9.4-> 9.0. Not due for ESA yet. Continue to monitor trend, anticipate increasing next ESA dose.   Metabolic bone disease: Calcium and phos controlled. Continue hectorol and calcium acetate.   Nutrition:  Renal diet with fluid restrictions. Will check albumin with next labs.   Anice Paganini, PA-C 05/09/2021, 9:02 AM  [facility] Pager: [phone]  Patient seen and examined, agree with above note with above  modifications. Says is itching in HD.  Feeling better but not good enough to leave yet-  HD today -  have kept him on his usual schedule while here-  dialysis related labs are good  Corliss Parish, MD 05/09/2021

## 2021-05-09 NOTE — Procedures (Signed)
Patient was seen on dialysis and the procedure was supervised.  BFR 400  Via AVF BP is  137/84.   Patient appears to be tolerating treatment well  Louis Meckel 05/09/2021

## 2021-05-09 NOTE — Progress Notes (Signed)
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 Recent hospitalization 03/2021 for COVID infection--was on up to 20 L high flow oxygen received remdesivir--- steroids on discharge Came to emergency room initially 11/11 found to have pleural effusion--seen by them and recommended outpatient thoracentesis--this was performed by Dr. Shearon Stalls 05/04/2019 2012 Started Rocephin Levaquin for pneumonia at [facility] around 11/14 in addition to progressive weakness nonambulatory now with walker Patient found to have nonexudative pleural effusion CT chest showed bilateral patchy infiltrates?  Multifocal pneumonia CT abd/pelv negative for anomaly   Hospital-Problem based course  Multifocal pneumonia causing acute superimposed on chronic respiratory failure Transudative pleural effusion Initially on broad-spectrum ceftriaxone azithromycin vancomycin-narrowed to Levaquin-stop date 11/29 dose with dialysis Periodic chest x-ray in the next month as an outpatient--desat screen prior to discharge Transudative pleural effusion and no growth from 11/17 so empiric antibiotics Recent recovery from COVID 10/22--discharged on steroids and was on remdesivir needed high flow oxygen Likely secondary related to above Desat screen on discharge Generalized weakness and fatigue secondary to illness Secondary to immobility-MRI brain   for infarct TSH low normal B12 was 291 supplementation started Patient is already at skilled facility and will need follow-up in the outpatient setting ESRD since 12/05/2019 Deferring to nephrology HD orders, iron and metabolic bone disease treatment Still produces throat urine continue Demadex 80 twice  daily Continue PhosLo 667 3 times daily DM TY 2 gastroparesis nephropathy neuropathy CBGs ranging 140s to 213 eating 100% meals Not on insulin prior to admission presumably secondary to low sugars in the outpatient? Continue Lyrica 25 twice daily HTN Continue under the guidance of nephrology amlodipine 5 losartan 100 Left eye blindness secondary to glaucoma Continue atropine 1% drops twice daily, Alphagan 1 drop left eye twice daily, Cosopt 1 drop twice daily, latanoprost 1 drop at bedtime, Patanol 2 drops daily and Pred forte 1 drops both eyes 3 times daily along with liquid tears Depression Continue Zoloft 25 daily   DVT prophylaxis: heparin Code Status: full Family Communication: none bedside Disposition:  Status is: Inpatient  Remains inpatient appropriate because: medically unstable       Consultants:  none  Procedures: n  Antimicrobials:  Currently levaquin till 11/29    Subjective: Seen on HD unit Looks well feels fair no cp no sputum no chills Desats momentarily off of Oxygen to 88 but feels more comfortable with it on No fever   Objective: Vitals:   05/08/21 0902 05/08/21 1724 05/08/21 2203 05/09/21 0600  BP: 135/78 136/82 (!) 141/81 (!) 147/80  Pulse: 87 82 82 76  Resp: 17 17 18 18   Temp: 98 F (36.7 C) 97.6 F (36.4 C) (!) 97.5 F (36.4 C) 98 F (36.7 C)  TempSrc: Oral  Oral   SpO2: 100% 90% 94% 96%  Weight:      Height:        Intake/Output Summary (Last 24 hours) at 05/09/2021 0732 Last data filed at 05/08/2021 2238 Gross per 24 hour  Intake 1550 ml  Output --  Net 1550 ml   Filed Weights   05/06/21 1802  Weight: 59 kg    Examination:  Eomi ncat no focal deficit Abd soft nt nd no rebound RUE Fistula noted Abd soft nt nd no  rebound No le edema Neuro intact Poor Light reflex and blind bilaterally  Data Reviewed: personally reviewed   CBC    Component Value Date/Time   WBC 10.8 (H) 05/09/2021 0229   RBC 3.26 (L)  05/09/2021 0229   HGB 9.0 (L) 05/09/2021 0229   HCT 29.4 (L) 05/09/2021 0229   PLT 285 05/09/2021 0229   MCV 90.2 05/09/2021 0229   MCH 27.6 05/09/2021 0229   MCHC 30.6 05/09/2021 0229   RDW 17.1 (H) 05/09/2021 0229   LYMPHSABS 0.5 (L) 04/27/2021 1624   MONOABS 0.4 04/27/2021 1624   EOSABS 0.1 04/27/2021 1624   BASOSABS 0.0 04/27/2021 1624   CMP Latest Ref Rng & Units 05/09/2021 05/08/2021 05/07/2021  Glucose 70 - 99 mg/dL 145(H) 146(H) 98  BUN 6 - 20 mg/dL 19 9 29(H)  Creatinine 0.61 - 1.24 mg/dL 4.13(H) 2.83(H) 5.51(H)  Sodium 135 - 145 mmol/L 135 134(L) 134(L)  Potassium 3.5 - 5.1 mmol/L 3.5 3.3(L) 3.9  Chloride 98 - 111 mmol/L 98 98 99  CO2 22 - 32 mmol/L 28 28 26   Calcium 8.9 - 10.3 mg/dL 8.5(L) 8.2(L) 8.6(L)  Total Protein 6.5 - 8.1 g/dL - - -  Total Bilirubin 0.3 - 1.2 mg/dL - - -  Alkaline Phos 38 - 126 U/L - - -  AST 15 - 41 U/L - - -  ALT 0 - 44 U/L - - -     Radiology Studies: MR BRAIN WO CONTRAST  Result Date: 05/07/2021 CLINICAL DATA:  Delirium EXAM: MRI HEAD WITHOUT CONTRAST TECHNIQUE: Multiplanar, multiecho pulse sequences of the brain and surrounding structures were obtained without intravenous contrast. COMPARISON:  None. FINDINGS: Brain: There is no acute infarction or intracranial hemorrhage. There is no intracranial mass, mass effect, or edema. There is no hydrocephalus or extra-axial fluid collection. Prominence of the ventricles and sulci reflects parenchymal volume loss greater than expected for age. Chronic left basal ganglia infarct with blood products. Additional patchy and confluent T2 hyperintensity in the supratentorial and pontine white matter is nonspecific but may reflect moderate chronic microvascular ischemic changes. Vascular: Major vessel flow voids at the skull base are preserved. Skull and upper cervical spine: Normal marrow signal is preserved. Sinuses/Orbits: Mild mucosal thickening. Abnormal signal within both globes likely reflecting  proteinaceous material as seen on prior CT imaging. Other: Sella is unremarkable.  Mastoid air cells are clear. IMPRESSION: No evidence of recent infarction, hemorrhage, or mass. Chronic/nonemergent findings detailed above. Parenchymal volume loss and chronic microvascular ischemic changes are age advanced. Electronically Signed   By: Macy Mis M.D.   On: 05/07/2021 11:44     Scheduled Meds:  amLODipine  5 mg Oral Daily   aspirin EC  81 mg Oral Daily   atorvastatin  40 mg Oral Daily   atropine  1 drop Both Eyes BID   brimonidine  1 drop Left Eye BID   calcium acetate  667 mg Oral TID WC   Chlorhexidine Gluconate Cloth  6 each Topical Q0600   dorzolamide-timolol  1 drop Left Eye BID   [START ON 05/10/2021] heparin  300 Units Dialysis Once in dialysis   heparin  5,000 Units Subcutaneous Q8H   latanoprost  1 drop Left Eye QHS   levofloxacin  500 mg Oral Q48H   losartan  100 mg Oral Daily   melatonin  10 mg Oral QHS   olopatadine  2 drop Both Eyes Daily   polyvinyl alcohol  1 drop Both Eyes TID   prednisoLONE acetate  1 drop Both Eyes TID AC & HS   pregabalin  25 mg Oral BID   senna  2 tablet Oral QHS   sertraline  25 mg Oral Daily   sodium chloride flush  3 mL Intravenous Q12H   torsemide  80 mg Oral BID   vitamin B-12  1,000 mcg Oral Daily   Continuous Infusions:  [START ON 05/10/2021] sodium chloride     [START ON 05/10/2021] sodium chloride       LOS: 3 days   Time spent: [place], MD [facility] To contact the attending provider between 7A-7P or the covering provider during after hours 7P-7A, please log into the web site [email or web address] and access using universal [place] password for that web site. If you do not have the password, please call the hospital operator.  05/09/2021, 7:32 AM

## 2021-05-10 DIAGNOSIS — J189 Pneumonia, unspecified organism: Secondary | ICD-10-CM | POA: Diagnosis not present

## 2021-05-10 LAB — COMPREHENSIVE METABOLIC PANEL
ALT: 11 U/L (ref 0–44)
AST: 17 U/L (ref 15–41)
Albumin: 2.6 g/dL — ABNORMAL LOW (ref 3.5–5.0)
Alkaline Phosphatase: 100 U/L (ref 38–126)
Anion gap: 7 (ref 5–15)
BUN: 14 mg/dL (ref 6–20)
CO2: 28 mmol/L (ref 22–32)
Calcium: 8.5 mg/dL — ABNORMAL LOW (ref 8.9–10.3)
Chloride: 99 mmol/L (ref 98–111)
Creatinine, Ser: 2.94 mg/dL — ABNORMAL HIGH (ref 0.61–1.24)
GFR, Estimated: 25 mL/min — ABNORMAL LOW (ref >60)
Glucose, Bld: 148 mg/dL — ABNORMAL HIGH (ref 70–99)
Potassium: 4.2 mmol/L (ref 3.5–5.1)
Sodium: 134 mmol/L — ABNORMAL LOW (ref 135–145)
Total Bilirubin: 0.2 mg/dL — ABNORMAL LOW (ref 0.3–1.2)
Total Protein: 6.1 g/dL — ABNORMAL LOW (ref 6.5–8.1)

## 2021-05-10 LAB — CBC WITH DIFFERENTIAL/PLATELET
Abs Immature Granulocytes: 0.19 10*3/uL — ABNORMAL HIGH (ref 0.00–0.07)
Basophils Absolute: 0.1 10*3/uL (ref 0.0–0.1)
Basophils Relative: 1 %
Eosinophils Absolute: 0.1 10*3/uL (ref 0.0–0.5)
Eosinophils Relative: 1 %
HCT: 30.7 % — ABNORMAL LOW (ref 39.0–52.0)
Hemoglobin: 9.3 g/dL — ABNORMAL LOW (ref 13.0–17.0)
Immature Granulocytes: 2 %
Lymphocytes Relative: 13 %
Lymphs Abs: 1.2 10*3/uL (ref 0.7–4.0)
MCH: 27.4 pg (ref 26.0–34.0)
MCHC: 30.3 g/dL (ref 30.0–36.0)
MCV: 90.3 fL (ref 80.0–100.0)
Monocytes Absolute: 1.3 10*3/uL — ABNORMAL HIGH (ref 0.1–1.0)
Monocytes Relative: 14 %
Neutro Abs: 6.7 10*3/uL (ref 1.7–7.7)
Neutrophils Relative %: 69 %
Platelets: 273 10*3/uL (ref 150–400)
RBC: 3.4 MIL/uL — ABNORMAL LOW (ref 4.22–5.81)
RDW: 17.3 % — ABNORMAL HIGH (ref 11.5–15.5)
WBC: 9.6 10*3/uL (ref 4.0–10.5)
nRBC: 0 % (ref 0.0–0.2)

## 2021-05-10 LAB — RESP PANEL BY RT-PCR (FLU A&B, COVID) ARPGX2
Influenza A by PCR: NEGATIVE
Influenza B by PCR: NEGATIVE
SARS Coronavirus 2 by RT PCR: NEGATIVE

## 2021-05-10 MED ORDER — SERTRALINE HCL 25 MG PO TABS: 25.0000 mg | ORAL_TABLET | Freq: Every day | ORAL | 0 refills | Status: DC

## 2021-05-10 MED ORDER — OXYCODONE-ACETAMINOPHEN 5-325 MG PO TABS: 1.0000 | ORAL_TABLET | ORAL | 0 refills | Status: DC

## 2021-05-10 MED ORDER — AMLODIPINE BESYLATE 10 MG PO TABS: 5.0000 mg | ORAL_TABLET | Freq: Every day | ORAL | Status: DC

## 2021-05-10 MED ORDER — ALUM & MAG HYDROXIDE-SIMETH 200-200-20 MG/5ML PO SUSP
30.0000 mL | ORAL | Status: DC | PRN
  Administered 2021-05-10: 30 mL via ORAL
  Filled 2021-05-10: qty 30

## 2021-05-10 MED ORDER — LEVOFLOXACIN 500 MG PO TABS: 500.0000 mg | ORAL_TABLET | ORAL | 0 refills | Status: AC

## 2021-05-10 MED ORDER — PREGABALIN 25 MG PO CAPS: 25.0000 mg | ORAL_CAPSULE | Freq: Two times a day (BID) | ORAL | 0 refills | Status: DC

## 2021-05-10 MED ORDER — LIP MEDEX EX OINT
1.0000 "application " | TOPICAL_OINTMENT | CUTANEOUS | Status: DC | PRN
  Filled 2021-05-10: qty 7

## 2021-05-10 MED ORDER — CYANOCOBALAMIN 1000 MCG PO TABS: 1000.0000 ug | ORAL_TABLET | Freq: Every day | ORAL | 3 refills | Status: DC

## 2021-05-10 NOTE — Progress Notes (Deleted)
Physician Discharge Summary  Christopher Davila CZY:[id number] DOB: 1971/03/04 DOA: 05/06/2021  PCP: [name], MD  Admit date: 05/06/2021 Discharge date: 05/10/2021  Time spent: 36 minutes  Recommendations for Outpatient Follow-up:  Complete oral Levaquin 11/29 as per instructions based on kidney function Recommend outpatient chest x-ray 3 weeks to note clearing of infiltrates as had multifocal pneumonia Recommend CBC renal panel 1 week Started this admission on B12--- recommend methylmalonic acid and B12 levels in about 1 month Consider addition of low-dose oral hypoglycemic agents-was not on anything this admission  Discharge Diagnoses:  MAIN problem for hospitalization   Multifocal pneumonia likely secondary to immunocompromise state from recent use of steroids in the setting of recent COVID  Please see below for itemized issues addressed in [facility]psital- refer to other progress notes for clarity if needed  Discharge Condition: Fair  Diet recommendation: Renal  Filed Weights   05/06/21 1802 05/09/21 0812  Weight: 59 kg 54.9 kg    History of present illness:  50 year old black male [place] resident since 10/04/2020 ESRD MWF (started HD 11/2019), DM TY 2+ gastroparesis HFpEF EF 55-60 in 2020, 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 Recent hospitalization 03/2021 for COVID infection--was on up to 20 L high flow oxygen received remdesivir--- steroids on discharge Came to emergency room initially 11/11 found to have pleural effusion--seen by them and recommended outpatient thoracentesis--this was performed by Dr. Shearon Stalls 05/04/2019 2012 Started Rocephin Levaquin for pneumonia at SNF around 11/14 in addition to progressive weakness nonambulatory now with walker Patient found to have nonexudative pleural effusion CT chest showed bilateral patchy infiltrates?  Multifocal pneumonia CT abd/pelv negative for anomaly  Hospital Course:   Multifocal pneumonia causing acute superimposed on chronic respiratory failure Transudative pleural effusion Initially on broad-spectrum ceftriaxone azithromycin vancomycin-narrowed to Levaquin-stop date 11/29 dose with dialysis Periodic chest x-ray in the next month as an outpatient--patient desatted and will need to continue oxygen 2 to 3 L Transudative pleural effusion and no growth from 11/17 so empiric antibiotics only Recent recovery from [place] 10/22--discharged on steroids and was on remdesivir needed high flow oxygen Likely secondary related to above Has risk for recurrence given poor dentition, recent steroids-x-ray as above Generalized weakness and fatigue secondary to illness Secondary to immobility-MRI brain   for infarct TSH low normal B12 was 291 supplementation started--Will need methylmalonic acid and B12 level in the outpatient setting ESRD since 12/05/2019 Deferring to nephrology HD orders, iron and metabolic bone disease treatment Still produces urine continue Demadex 80 twice daily Continue PhosLo 667 3 times daily DM TY 2 gastroparesis nephropathy neuropathy CBGs ranging 140s to 213 eating 100% meals Not on insulin prior to admission presumably secondary to low sugars in the outpatient?--Can consider addition of low-dose glipizide per PCP Continue Lyrica 25 twice daily HTN Continue under the guidance of nephrology amlodipine 5 losartan 100 Left eye blindness secondary to glaucoma Continue atropine 1% drops twice daily, Alphagan 1 drop left eye twice daily, Cosopt 1 drop twice daily, latanoprost 1 drop at bedtime, Patanol 2 drops daily and Pred forte 1 drops both eyes 3 times daily along with liquid tears Depression Continue Zoloft 25 daily   Discharge Exam: Vitals:   05/09/21 2054 05/10/21 0522  BP: 126/77 132/82  Pulse: 75 81  Resp: 18 15  Temp: 98.1 F (36.7 C) 98.5 F (36.9 C)  SpO2: 100% 92%    Subj on day of d/c   Awake coherent asking about his  pneumonia and whether  this will recur Explained to him immunocompromise state briefly He has no other complaints  General Exam on discharge  EOMI although patient is legally blind and cannot see me Throat is soft supple no LAM no thyromegaly S1-S2 no murmur Chest is clear no added sound no rales no rhonchi Abdomen soft nontender no rebound no guarding no organomegaly Power is 5/5 Psych affect is flat  Discharge Instructions   Discharge Instructions     Diet - low sodium heart healthy   Complete by: As directed    Increase activity slowly   Complete by: As directed       Allergies as of 05/10/2021       Reactions   Morphine And Related Itching, Other (See Comments)   Pt prefers not to be given this drug        Medication List     STOP taking these medications    diphenhydrAMINE 25 MG tablet Commonly known as: BENADRYL   hydrALAZINE 50 MG tablet Commonly known as: APRESOLINE   ibuprofen 800 MG tablet Commonly known as: ADVIL       TAKE these medications    albuterol 108 (90 Base) MCG/ACT inhaler Commonly known as: VENTOLIN HFA Inhale 2 puffs into the lungs every 4 (four) hours as needed for shortness of breath.   amLODipine 10 MG tablet Commonly known as: NORVASC Take 0.5 tablets (5 mg total) by mouth daily. What changed: how much to take   aspirin EC 81 MG tablet Take 1 tablet (81 mg total) by mouth daily.   atorvastatin 40 MG tablet Commonly known as: Lipitor Take 1 tablet (40 mg total) by mouth daily.   atropine 1 % ophthalmic solution Place 1 drop into both eyes in the morning and at bedtime.   brimonidine 0.1 % Soln Commonly known as: ALPHAGAN P Place 1 drop into the left eye in the morning and at bedtime.   calcium acetate 667 MG capsule Commonly known as: PHOSLO Take 1 capsule (667 mg total) by mouth 3 (three) times daily with meals.   cyanocobalamin 1000 MCG tablet Take 1 tablet (1,000 mcg total) by mouth daily. Start taking on:  May 11, 2021   dorzolamide-timolol 22.3-6.8 MG/ML ophthalmic solution Commonly known as: COSOPT Place 1 drop into the left eye 2 (two) times daily.   ferrous sulfate 325 (65 FE) MG tablet Take 325 mg by mouth daily.   guaiFENesin 100 MG/5ML liquid Commonly known as: ROBITUSSIN Take 100 mg by mouth every 4 (four) hours as needed for cough.   latanoprost 0.005 % ophthalmic solution Commonly known as: XALATAN Place 1 drop into the left eye at bedtime.   levofloxacin 500 MG tablet Commonly known as: LEVAQUIN Take 1 tablet (500 mg total) by mouth every other day for 4 days. Start taking on: May 11, 2021   losartan 100 MG tablet Commonly known as: COZAAR Take 1 tablet (100 mg total) by mouth daily.   Melatonin 10 MG Caps Take 10 mg by mouth at bedtime. What changed: Another medication with the same name was removed. Continue taking this medication, and follow the directions you see here.   olopatadine 0.1 % ophthalmic solution Commonly known as: PATANOL Place 2 drops into both eyes daily.   ondansetron 4 MG tablet Commonly known as: ZOFRAN Take 4 mg by mouth every 6 (six) hours as needed for nausea or vomiting.   oxyCODONE-acetaminophen 5-325 MG tablet Commonly known as: PERCOCET/ROXICET Take 1 tablet by mouth See admin instructions. 1  tablet once daily every Tuesday, Thursday, Saturday, and Sunday. May take an additional 1 tablet every 6 hours as needed on Monday's Wednesday's, and Friday's.   OXYGEN Inhale 3 L into the lungs continuous.   polyethylene glycol 17 g packet Commonly known as: MIRALAX / GLYCOLAX Take 17 g by mouth daily.   prednisoLONE acetate 1 % ophthalmic suspension Commonly known as: PRED FORTE Place 1 drop into both eyes 4 (four) times daily.   pregabalin 25 MG capsule Commonly known as: LYRICA Take 1 capsule (25 mg total) by mouth 2 (two) times daily.   senna 8.6 MG Tabs tablet Commonly known as: SENOKOT Take 2 tablets by mouth at  bedtime.   sertraline 25 MG tablet Commonly known as: ZOLOFT Take 1 tablet (25 mg total) by mouth daily. On Monday's, Wednesday's, and Friday's, take after dialysis. All other days take in the morning.   Tears Pure 0.1-0.3 % Soln Generic drug: Dextran 70-Hypromellose Place 1 drop into both eyes in the morning, at noon, in the evening, and at bedtime.   torsemide 20 MG tablet Commonly known as: DEMADEX Take 80 mg by mouth 2 (two) times daily.   Vitamin D (Ergocalciferol) 1.25 MG (50000 UNIT) Caps capsule Commonly known as: DRISDOL Take 50,000 Units by mouth every 7 (seven) days. Sunday's       Allergies  Allergen Reactions   Morphine And Related Itching and Other (See Comments)    Pt prefers not to be given this drug      The results of significant diagnostics from this hospitalization (including imaging, microbiology, ancillary and laboratory) are listed below for reference.    Significant Diagnostic Studies: DG Chest 2 View  Result Date: 05/06/2021 CLINICAL DATA:  Status post thoracentesis EXAM: CHEST - 2 VIEW COMPARISON:  05/03/2021 FINDINGS: The lungs are symmetrically well expanded. Small left pleural effusion is present, decreased in size since prior examination and partially loculated laterally. Small left pleural effusion is unchanged. Mild superimposed bilateral perihilar and lower lung zone interstitial infiltrate persists, asymmetrically more severe on the left, edema versus infection. Cardiac size is mildly enlarged, unchanged. No pneumothorax. IMPRESSION: Interval right thoracentesis. Small residual right pleural effusion, partially laterally loculated. No pneumothorax. Stable small left pleural effusion. Mild perihilar and lower lung zone interstitial pulmonary infiltrate, asymmetrically more severe on the left, edema versus infection. Electronically Signed   By: Fidela Salisbury M.D.   On: 05/06/2021 19:44   DG Chest 2 View  Result Date: 05/03/2021 CLINICAL DATA:   Status post thoracentesis. EXAM: CHEST - 2 VIEW COMPARISON:  Chest x-ray dated April 27, 2021 FINDINGS: Cardiac and mediastinal contours are unchanged. Small bilateral pleural effusions with adjacent atelectasis, left pleural effusion is decreased in size when compared with prior exam. No evidence of pneumothorax. IMPRESSION: No evidence of pneumothorax status post thoracentesis. Electronically Signed   By: Yetta Glassman M.D.   On: 05/03/2021 11:46   MR BRAIN WO CONTRAST  Result Date: 05/07/2021 CLINICAL DATA:  Delirium EXAM: MRI HEAD WITHOUT CONTRAST TECHNIQUE: Multiplanar, multiecho pulse sequences of the brain and surrounding structures were obtained without intravenous contrast. COMPARISON:  None. FINDINGS: Brain: There is no acute infarction or intracranial hemorrhage. There is no intracranial mass, mass effect, or edema. There is no hydrocephalus or extra-axial fluid collection. Prominence of the ventricles and sulci reflects parenchymal volume loss greater than expected for age. Chronic left basal ganglia infarct with blood products. Additional patchy and confluent T2 hyperintensity in the supratentorial and pontine white matter is nonspecific  but may reflect moderate chronic microvascular ischemic changes. Vascular: Major vessel flow voids at the skull base are preserved. Skull and upper cervical spine: Normal marrow signal is preserved. Sinuses/Orbits: Mild mucosal thickening. Abnormal signal within both globes likely reflecting proteinaceous material as seen on prior CT imaging. Other: Sella is unremarkable.  Mastoid air cells are clear. IMPRESSION: No evidence of recent infarction, hemorrhage, or mass. Chronic/nonemergent findings detailed above. Parenchymal volume loss and chronic microvascular ischemic changes are age advanced. Electronically Signed   By: Macy Mis M.D.   On: 05/07/2021 11:44   CT CHEST ABDOMEN PELVIS W CONTRAST  Result Date: 05/06/2021 CLINICAL DATA:  Weakness and  abdominal pain, initial encounter EXAM: CT CHEST, ABDOMEN, AND PELVIS WITH CONTRAST TECHNIQUE: Multidetector CT imaging of the chest, abdomen and pelvis was performed following the standard protocol during bolus administration of intravenous contrast. CONTRAST:  64mL ISOVUE-300 IOPAMIDOL (ISOVUE-300) INJECTION 61% COMPARISON:  Chest x-ray from earlier in the same day, CT from 03/30/2021. FINDINGS: CT CHEST FINDINGS Cardiovascular: Thoracic aorta demonstrates a normal branching pattern. Mild atherosclerotic calcifications are noted. No aneurysmal dilatation or dissection is noted. Pulmonary artery is within normal limits without evidence of embolus although timing was not performed for embolus evaluation. Mild coronary calcifications are noted. Mediastinum/Nodes: No mediastinal or hilar adenopathy is noted. The esophagus as visualized is within normal limits. The thoracic inlet is unremarkable. Lungs/Pleura: Bilateral pleural effusions are noted right considerably greater than left. Patchy consolidation is noted consistent with multifocal pneumonia within the lower lobes bilaterally. These changes are relatively stable from prior CT from 03/30/2021. No new parenchymal nodule is noted. The ground-glass attenuation has nearly completely resolved when compared with the prior CT. Musculoskeletal: No acute rib abnormality is noted. No acute bony abnormality is seen. CT ABDOMEN PELVIS FINDINGS Hepatobiliary: Gallbladder is well distended with dependent gallstones. Liver is within normal limits. No obstructive changes are seen. Pancreas: Unremarkable. No pancreatic ductal dilatation or surrounding inflammatory changes. Spleen: Normal in size without focal abnormality. Adrenals/Urinary Tract: Adrenal glands are within normal limits. Kidneys demonstrate a normal enhancement pattern bilaterally. Bladder is well distended. Stomach/Bowel: No obstructive or inflammatory changes of the colon are seen. Appendix is not well  visualized. No inflammatory changes to suggest appendicitis are seen. Small bowel and stomach are within normal limits. Vascular/Lymphatic: Aortic atherosclerosis. No enlarged abdominal or pelvic lymph nodes. Reproductive: Prostate is unremarkable. Other: No abdominal wall hernia or abnormality. No abdominopelvic ascites. Musculoskeletal: No acute or significant osseous findings. IMPRESSION: CT of the chest: Patchy consolidation and bilateral effusions right greater than left similar to that seen on prior CT. This is again consistent with multifocal pneumonia. No new focal abnormality is noted. CT of the abdomen and pelvis: Cholelithiasis without complicating factors. No other focal abnormality is seen. Electronically Signed   By: Inez Catalina M.D.   On: 05/06/2021 20:28   DG Chest Port 1 View  Result Date: 04/27/2021 CLINICAL DATA:  Shortness of breath a EXAM: PORTABLE CHEST 1 VIEW COMPARISON:  04/03/2021 FINDINGS: Stable cardiomediastinal contours. Moderate left and small-moderate right pleural effusions. Left-sided pleural effusion is slightly increased from prior. Patchy bibasilar airspace opacities, left worse than right, also progressed from prior. No pneumothorax. IMPRESSION: Moderate left and small-moderate right pleural effusions with associated bibasilar airspace opacities, progressed from prior. Electronically Signed   By: Davina Poke D.O.   On: 04/27/2021 16:47    Microbiology: Recent Results (from the past 240 hour(s))  Body Fluid Culture     Status: None  Collection Time: 05/03/21 11:53 AM   Specimen: Pleural; Body Fluid   Body Fluid  Result Value Ref Range Status   Body Fluid Culture, Sterile Final report  Final   Organism ID, Bacteria Comment  Final    Comment: No growth in 56 - 72 hours.  Resp Panel by RT-PCR (Flu A&B, Covid) Nasopharyngeal Swab     Status: None   Collection Time: 05/06/21  6:25 PM   Specimen: Nasopharyngeal Swab; Nasopharyngeal(NP) swabs in vial transport  medium  Result Value Ref Range Status   SARS Coronavirus 2 by RT PCR NEGATIVE NEGATIVE Final    Comment: (NOTE) SARS-CoV-2 target nucleic acids are NOT DETECTED.  The SARS-CoV-2 RNA is generally detectable in upper respiratory specimens during the acute phase of infection. The lowest concentration of SARS-CoV-2 viral copies this assay can detect is 138 copies/mL. A negative result does not preclude SARS-Cov-2 infection and should not be used as the sole basis for treatment or other patient management decisions. A negative result may occur with  improper specimen collection/handling, submission of specimen other than nasopharyngeal swab, presence of viral mutation(s) within the areas targeted by this assay, and inadequate number of viral copies(<138 copies/mL). A negative result must be combined with clinical observations, patient history, and epidemiological information. The expected result is Negative.  Fact Sheet for Patients:  EntrepreneurPulse.com.au  Fact Sheet for Healthcare Providers:  IncredibleEmployment.be  This test is no t yet approved or cleared by the [place] FDA and  has been authorized for detection and/or diagnosis of SARS-CoV-2 by FDA under an Emergency Use Authorization (EUA). This EUA will remain  in effect (meaning this test can be used) for the duration of the COVID-19 declaration under Section 564(b)(1) of the Act, 21 U.S.C.section 360bbb-3(b)(1), unless the authorization is terminated  or revoked sooner.       Influenza A by PCR NEGATIVE NEGATIVE Final   Influenza B by PCR NEGATIVE NEGATIVE Final    Comment: (NOTE) The Xpert Xpress SARS-CoV-2/FLU/RSV plus assay is intended as an aid in the diagnosis of influenza from Nasopharyngeal swab specimens and should not be used as a sole basis for treatment. Nasal washings and aspirates are unacceptable for Xpert Xpress SARS-CoV-2/FLU/RSV testing.  Fact Sheet for  Patients: EntrepreneurPulse.com.au  Fact Sheet for Healthcare Providers: IncredibleEmployment.be  This test is not yet approved or cleared by the [place] FDA and has been authorized for detection and/or diagnosis of SARS-CoV-2 by FDA under an Emergency Use Authorization (EUA). This EUA will remain in effect (meaning this test can be used) for the duration of the COVID-19 declaration under Section 564(b)(1) of the Act, 21 U.S.C. section 360bbb-3(b)(1), unless the authorization is terminated or revoked.  Performed at [facility], [place] [address], [place], [place] [address]   MRSA Next Gen by PCR, Nasal     Status: None   Collection Time: 05/08/21  5:36 AM   Specimen: Nasal Mucosa; Nasal Swab  Result Value Ref Range Status   MRSA by PCR Next Gen NOT DETECTED NOT DETECTED Final    Comment: (NOTE) The GeneXpert MRSA Assay (FDA approved for NASAL specimens only), is one component of a comprehensive MRSA colonization surveillance program. It is not intended to diagnose MRSA infection nor to guide or monitor treatment for MRSA infections. Test performance is not FDA approved in patients less than 6 years old. Performed at [facility], [place] [address] [place], [place] [address]      Labs: Basic Metabolic Panel:  Recent Labs  Lab 05/06/21 1820 05/07/21 0500 05/08/21 0211 05/09/21 0229 05/10/21 0252  NA 136 134* 134* 135 134*  K 3.9 3.9 3.3* 3.5 4.2  CL 96* 99 98 98 99  CO2 28 26 28 28 28   GLUCOSE 113* 98 146* 145* 148*  BUN 26* 29* 9 19 14   CREATININE 5.08* 5.51* 2.83* 4.13* 2.94*  CALCIUM 9.4 8.6* 8.2* 8.5* 8.5*  PHOS  --  3.5  --   --   --    Liver Function Tests: Recent Labs  Lab 05/06/21 1820 05/10/21 0252  AST 17 17  ALT 13 11  ALKPHOS 98 100  BILITOT 0.3 0.2*  PROT 7.1 6.1*  ALBUMIN 3.1* 2.6*   Recent Labs  Lab 05/06/21 1820  LIPASE 26   No results for input(s): AMMONIA in the last 168  hours. CBC: Recent Labs  Lab 05/06/21 1820 05/07/21 0500 05/08/21 0211 05/09/21 0229 05/10/21 0252  WBC 13.0* 10.7* 10.1 10.8* 9.6  NEUTROABS  --   --   --   --  6.7  HGB 11.2* 9.4* 9.4* 9.0* 9.3*  HCT 35.9* 31.1* 31.3* 29.4* 30.7*  MCV 91.6 91.2 91.3 90.2 90.3  PLT 368 320 307 285 273   Cardiac Enzymes: Recent Labs  Lab 05/06/21 2351  CKTOTAL 84   BNP: BNP (last 3 results) Recent Labs    03/16/21 1245 05/06/21 2351  BNP 188.6* 147.8*    ProBNP (last 3 results) No results for input(s): PROBNP in the last 8760 hours.  CBG: Recent Labs  Lab 05/07/21 1200 05/07/21 1750 05/07/21 2018 05/08/21 0638  GLUCAP 145* 76 213* 62*       Signed:  Nita Sells MD   [facility] 05/10/2021, 9:04 AM

## 2021-05-10 NOTE — Progress Notes (Addendum)
[facility] Progress Note   Subjective:   Pt seen in room, reports he is feeling well. Has mild heartburn but no other complaints. Denies SOB, CP, palpitations, dizziness, abdominal pain and nausea. Feeling stronger. Planning to d/c today to [name].   Objective Vitals:   05/09/21 1716 05/09/21 2054 05/10/21 0522 05/10/21 0935  BP: 133/78 126/77 132/82 (!) 150/80  Pulse: 74 75 81 81  Resp: 16 18 15 17   Temp: 98.4 F (36.9 C) 98.1 F (36.7 C) 98.5 F (36.9 C) 97.9 F (36.6 C)  TempSrc: Oral Oral  Oral  SpO2: 100% 100% 92% 100%  Weight:      Height:       Physical Exam General: Alert male in NAD Heart: RRR, no murmurs, rubs or gallops Lungs: CTA bilaterally, on RA, respirations unlabored Abdomen: Soft, non-distended, +BS Extremities: No edema b/l lower extremities Dialysis Access:  RUE AVF + t/b  Additional Objective Labs: Basic Metabolic Panel: Recent Labs  Lab 05/07/21 0500 05/08/21 0211 05/09/21 0229 05/10/21 0252  NA 134* 134* 135 134*  K 3.9 3.3* 3.5 4.2  CL 99 98 98 99  CO2 26 28 28 28   GLUCOSE 98 146* 145* 148*  BUN 29* 9 19 14   CREATININE 5.51* 2.83* 4.13* 2.94*  CALCIUM 8.6* 8.2* 8.5* 8.5*  PHOS 3.5  --   --   --    Liver Function Tests: Recent Labs  Lab 05/06/21 1820 05/10/21 0252  AST 17 17  ALT 13 11  ALKPHOS 98 100  BILITOT 0.3 0.2*  PROT 7.1 6.1*  ALBUMIN 3.1* 2.6*   Recent Labs  Lab 05/06/21 1820  LIPASE 26   CBC: Recent Labs  Lab 05/06/21 1820 05/07/21 0500 05/08/21 0211 05/09/21 0229 05/10/21 0252  WBC 13.0* 10.7* 10.1 10.8* 9.6  NEUTROABS  --   --   --   --  6.7  HGB 11.2* 9.4* 9.4* 9.0* 9.3*  HCT 35.9* 31.1* 31.3* 29.4* 30.7*  MCV 91.6 91.2 91.3 90.2 90.3  PLT 368 320 307 285 273   Blood Culture    Component Value Date/Time   SDES BLOOD RIGHT HAND 10/27/2019 1208   SDES BLOOD RIGHT HAND 10/27/2019 1208   SPECREQUEST  10/27/2019 1208    BOTTLES DRAWN AEROBIC AND ANAEROBIC Blood Culture adequate  volume   SPECREQUEST  10/27/2019 1208    BOTTLES DRAWN AEROBIC AND ANAEROBIC Blood Culture adequate volume   CULT  10/27/2019 1208    NO GROWTH 5 DAYS Performed at [facility], [place] [address], [place], [place] [address]    CULT  10/27/2019 1208    NO GROWTH 5 DAYS Performed at [facility], [place] [address] [place], [place] [address]    REPTSTATUS 11/01/2019 FINAL 10/27/2019 1208   REPTSTATUS 11/01/2019 FINAL 10/27/2019 1208    Cardiac Enzymes: Recent Labs  Lab 05/06/21 2351  CKTOTAL 84   CBG: Recent Labs  Lab 05/07/21 1200 05/07/21 1750 05/07/21 2018 05/08/21 0638  GLUCAP 145* 76 213* 62*   Iron Studies: No results for input(s): IRON, TIBC, TRANSFERRIN, FERRITIN in the last 72 hours. @lablastinr3 @ Studies/Results: No results found. Medications:   amLODipine  5 mg Oral Daily   aspirin EC  81 mg Oral Daily   atorvastatin  40 mg Oral Daily   atropine  1 drop Both Eyes BID   brimonidine  1 drop Left Eye BID   calcium acetate  667 mg Oral TID WC   Chlorhexidine Gluconate Cloth  6 each Topical Q0600   Chlorhexidine Gluconate Cloth  6 each Topical Q0600   dorzolamide-timolol  1 drop Left Eye BID   heparin  5,000 Units Subcutaneous Q8H   latanoprost  1 drop Left Eye QHS   levofloxacin  500 mg Oral Q48H   losartan  100 mg Oral Daily   melatonin  10 mg Oral QHS   olopatadine  2 drop Both Eyes Daily   polyvinyl alcohol  1 drop Both Eyes TID   prednisoLONE acetate  1 drop Both Eyes TID AC & HS   pregabalin  25 mg Oral BID   senna  2 tablet Oral QHS   sertraline  25 mg Oral Daily   sodium chloride flush  3 mL Intravenous Q12H   torsemide  80 mg Oral BID   vitamin B-12  1,000 mcg Oral Daily    Dialysis Orders: Center: [facility]  on MWF . 180NRe 4 hours BFR 400 DFR 600 EDW 54kg 2K 2Ca AVF15g heparin 3000 unit bolus Mircera 180mcg IV q 2 weeks- last dose 04/30/21 Hectorol 1 mcg IV q HD Calcium acetate 1 tabs PO q meal    Assessment/Plan: Pneumonia:  Ongoing SOB despite recent thoracentesis and antibiotics. Started on vancomycin, rocephin and azithromycin. Symptomatically improved. Management per primary team- still on O2  ESRD:  Dialysis on MWF schedule. On a holiday schedule this week due to Thanksgiving with inpatient MWF patients dialyzing on Sunday, Tuesday, Friday. However since patient had not had HD since Friday, we kept him on his regular schedule this week. HD today, next treatment will be Friday ( here)  or Saturday if discharged  to the OP unit. I did speak to his nurse at [facility] [place] to let them know he will need HD Saturday.  Hypertension: BP well controlled Stopped hydralazine and lower amlodipine to 5. Continue to challenge EDW as tolerated. Now close to EDW.  Chronic diastolic CHF: With bilateral pleural effusions noted on CT and recent thoracentesis. Volume status improved. Still makes some urine, continue torsemide.   Anemia: Hgb 9.3. Not due for ESA yet. Continue to monitor trend, anticipate increasing next ESA dose.   Metabolic bone disease: Calcium and phos controlled. Continue hectorol and calcium acetate.   Nutrition:  Renal diet with fluid restrictions.  GU-   had urinary retention so will leave with foley-  will need OP urology evaluation  Anice Paganini, PA-C 05/10/2021, 9:42 AM  [facility] Pager: [phone]  Patient seen and examined, agree with above note with above modifications. Sitting up on side of bed-  wanting to get dressed to go- upset about foley-  had urinary retention so was placed -  will need to continue and with urology follow up-  next HD Sat-  lower EDW as able as OP  Corliss Parish, MD 05/10/2021

## 2021-05-10 NOTE — Discharge Summary (Signed)
Physician Discharge Summary  Alassane Kalafut NID:[id number] DOB: 08-31-1970 DOA: 05/06/2021  PCP: [name], MD  Admit date: 05/06/2021 Discharge date: 05/10/2021  Time spent: 36 minutes  Recommendations for Outpatient Follow-up:  Complete oral Levaquin 11/29 as per instructions based on kidney function Recommend outpatient chest x-ray 3 weeks to note clearing of infiltrates as had multifocal pneumonia Recommend CBC renal panel 1 week Started this admission on B12--- recommend methylmalonic acid and B12 levels in about 1 month Consider addition of low-dose oral hypoglycemic agents-was not on anything this admission  Discharge Diagnoses:  MAIN problem for hospitalization   Multifocal pneumonia likely secondary to immunocompromise state from recent use of steroids in the setting of recent COVID  Please see below for itemized issues addressed in HOpsital- refer to other progress notes for clarity if needed  Discharge Condition: Fair  Diet recommendation: Renal  Filed Weights   05/06/21 1802 05/09/21 0812  Weight: 59 kg 54.9 kg    History of present illness:  50 year old black male [place] resident since 10/04/2020 ESRD MWF (started HD 11/2019), DM TY 2+ gastroparesis HFpEF EF 55-60 in 2020, 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 Recent hospitalization 03/2021 for COVID infection--was on up to 20 L high flow oxygen received remdesivir--- steroids on discharge Came to emergency room initially 11/11 found to have pleural effusion--seen by them and recommended outpatient thoracentesis--this was performed by Dr. Shearon Stalls 05/04/2019 2012 Started Rocephin Levaquin for pneumonia at SNF around 11/14 in addition to progressive weakness nonambulatory now with walker Patient found to have nonexudative pleural effusion CT chest showed bilateral patchy infiltrates?  Multifocal pneumonia CT abd/pelv negative for anomaly  Hospital Course:   Multifocal pneumonia causing acute superimposed on chronic respiratory failure Transudative pleural effusion Initially on broad-spectrum ceftriaxone azithromycin vancomycin-narrowed to Levaquin-stop date 11/29 dose with dialysis Periodic chest x-ray in the next month as an outpatient--patient desatted and will need to continue oxygen 2 to 3 L Transudative pleural effusion and no growth from 11/17 so empiric antibiotics only Recent recovery from [facility] 10/22--discharged on steroids and was on remdesivir needed high flow oxygen Likely secondary related to above Has risk for recurrence given poor dentition, recent steroids-x-ray as above Generalized weakness and fatigue secondary to illness Secondary to immobility-MRI brain   for infarct TSH low normal B12 was 291 supplementation started--Will need methylmalonic acid and B12 level in the outpatient setting ESRD since 12/05/2019 Deferring to nephrology HD orders, iron and metabolic bone disease treatment Still produces urine continue Demadex 80 twice daily Continue PhosLo 667 3 times daily DM TY 2 gastroparesis nephropathy neuropathy CBGs ranging 140s to 213 eating 100% meals Not on insulin prior to admission presumably secondary to low sugars in the outpatient?--Can consider addition of low-dose glipizide per PCP Continue Lyrica 25 twice daily HTN Continue under the guidance of nephrology amlodipine 5 losartan 100 Left eye blindness secondary to glaucoma Continue atropine 1% drops twice daily, Alphagan 1 drop left eye twice daily, Cosopt 1 drop twice daily, latanoprost 1 drop at bedtime, Patanol 2 drops daily and Pred forte 1 drops both eyes 3 times daily along with liquid tears Depression Continue Zoloft 25 daily   Discharge Exam: Vitals:   05/10/21 0522 05/10/21 0935  BP: 132/82 (!) 150/80  Pulse: 81 81  Resp: 15 17  Temp: 98.5 F (36.9 C) 97.9 F (36.6 C)  SpO2: 92% 100%    Subj on day of d/c   Awake coherent asking about his  pneumonia and  whether this will recur Explained to him immunocompromise state briefly He has no other complaints  General Exam on discharge  EOMI although patient is legally blind and cannot see me Throat is soft supple no LAM no thyromegaly S1-S2 no murmur Chest is clear no added sound no rales no rhonchi Abdomen soft nontender no rebound no guarding no organomegaly Power is 5/5 Psych affect is flat  Discharge Instructions   Discharge Instructions     Diet - low sodium heart healthy   Complete by: As directed    Increase activity slowly   Complete by: As directed       Allergies as of 05/10/2021       Reactions   Morphine And Related Itching, Other (See Comments)   Pt prefers not to be given this drug        Medication List     STOP taking these medications    diphenhydrAMINE 25 MG tablet Commonly known as: BENADRYL   hydrALAZINE 50 MG tablet Commonly known as: APRESOLINE   ibuprofen 800 MG tablet Commonly known as: ADVIL       TAKE these medications    albuterol 108 (90 Base) MCG/ACT inhaler Commonly known as: VENTOLIN HFA Inhale 2 puffs into the lungs every 4 (four) hours as needed for shortness of breath.   amLODipine 10 MG tablet Commonly known as: NORVASC Take 0.5 tablets (5 mg total) by mouth daily. What changed: how much to take   aspirin EC 81 MG tablet Take 1 tablet (81 mg total) by mouth daily.   atorvastatin 40 MG tablet Commonly known as: Lipitor Take 1 tablet (40 mg total) by mouth daily.   atropine 1 % ophthalmic solution Place 1 drop into both eyes in the morning and at bedtime.   brimonidine 0.1 % Soln Commonly known as: ALPHAGAN P Place 1 drop into the left eye in the morning and at bedtime.   calcium acetate 667 MG capsule Commonly known as: PHOSLO Take 1 capsule (667 mg total) by mouth 3 (three) times daily with meals.   cyanocobalamin 1000 MCG tablet Take 1 tablet (1,000 mcg total) by mouth daily. Start taking on:  May 11, 2021   dorzolamide-timolol 22.3-6.8 MG/ML ophthalmic solution Commonly known as: COSOPT Place 1 drop into the left eye 2 (two) times daily.   ferrous sulfate 325 (65 FE) MG tablet Take 325 mg by mouth daily.   guaiFENesin 100 MG/5ML liquid Commonly known as: ROBITUSSIN Take 100 mg by mouth every 4 (four) hours as needed for cough.   latanoprost 0.005 % ophthalmic solution Commonly known as: XALATAN Place 1 drop into the left eye at bedtime.   levofloxacin 500 MG tablet Commonly known as: LEVAQUIN Take 1 tablet (500 mg total) by mouth every other day for 4 days. Start taking on: May 11, 2021   losartan 100 MG tablet Commonly known as: COZAAR Take 1 tablet (100 mg total) by mouth daily.   Melatonin 10 MG Caps Take 10 mg by mouth at bedtime. What changed: Another medication with the same name was removed. Continue taking this medication, and follow the directions you see here.   olopatadine 0.1 % ophthalmic solution Commonly known as: PATANOL Place 2 drops into both eyes daily.   ondansetron 4 MG tablet Commonly known as: ZOFRAN Take 4 mg by mouth every 6 (six) hours as needed for nausea or vomiting.   oxyCODONE-acetaminophen 5-325 MG tablet Commonly known as: PERCOCET/ROXICET Take 1 tablet by mouth See admin instructions.  1 tablet once daily every Tuesday, Thursday, Saturday, and Sunday. May take an additional 1 tablet every 6 hours as needed on Monday's Wednesday's, and Friday's.   OXYGEN Inhale 3 L into the lungs continuous.   polyethylene glycol 17 g packet Commonly known as: MIRALAX / GLYCOLAX Take 17 g by mouth daily.   prednisoLONE acetate 1 % ophthalmic suspension Commonly known as: PRED FORTE Place 1 drop into both eyes 4 (four) times daily.   pregabalin 25 MG capsule Commonly known as: LYRICA Take 1 capsule (25 mg total) by mouth 2 (two) times daily.   senna 8.6 MG Tabs tablet Commonly known as: SENOKOT Take 2 tablets by mouth at  bedtime.   sertraline 25 MG tablet Commonly known as: ZOLOFT Take 1 tablet (25 mg total) by mouth daily. On Monday's, Wednesday's, and Friday's, take after dialysis. All other days take in the morning.   Tears Pure 0.1-0.3 % Soln Generic drug: Dextran 70-Hypromellose Place 1 drop into both eyes in the morning, at noon, in the evening, and at bedtime.   torsemide 20 MG tablet Commonly known as: DEMADEX Take 80 mg by mouth 2 (two) times daily.   Vitamin D (Ergocalciferol) 1.25 MG (50000 UNIT) Caps capsule Commonly known as: DRISDOL Take 50,000 Units by mouth every 7 (seven) days. Sunday's       Allergies  Allergen Reactions   Morphine And Related Itching and Other (See Comments)    Pt prefers not to be given this drug      The results of significant diagnostics from this hospitalization (including imaging, microbiology, ancillary and laboratory) are listed below for reference.    Significant Diagnostic Studies: DG Chest 2 View  Result Date: 05/06/2021 CLINICAL DATA:  Status post thoracentesis EXAM: CHEST - 2 VIEW COMPARISON:  05/03/2021 FINDINGS: The lungs are symmetrically well expanded. Small left pleural effusion is present, decreased in size since prior examination and partially loculated laterally. Small left pleural effusion is unchanged. Mild superimposed bilateral perihilar and lower lung zone interstitial infiltrate persists, asymmetrically more severe on the left, edema versus infection. Cardiac size is mildly enlarged, unchanged. No pneumothorax. IMPRESSION: Interval right thoracentesis. Small residual right pleural effusion, partially laterally loculated. No pneumothorax. Stable small left pleural effusion. Mild perihilar and lower lung zone interstitial pulmonary infiltrate, asymmetrically more severe on the left, edema versus infection. Electronically Signed   By: Fidela Salisbury M.D.   On: 05/06/2021 19:44   DG Chest 2 View  Result Date: 05/03/2021 CLINICAL DATA:   Status post thoracentesis. EXAM: CHEST - 2 VIEW COMPARISON:  Chest x-ray dated April 27, 2021 FINDINGS: Cardiac and mediastinal contours are unchanged. Small bilateral pleural effusions with adjacent atelectasis, left pleural effusion is decreased in size when compared with prior exam. No evidence of pneumothorax. IMPRESSION: No evidence of pneumothorax status post thoracentesis. Electronically Signed   By: Yetta Glassman M.D.   On: 05/03/2021 11:46   MR BRAIN WO CONTRAST  Result Date: 05/07/2021 CLINICAL DATA:  Delirium EXAM: MRI HEAD WITHOUT CONTRAST TECHNIQUE: Multiplanar, multiecho pulse sequences of the brain and surrounding structures were obtained without intravenous contrast. COMPARISON:  None. FINDINGS: Brain: There is no acute infarction or intracranial hemorrhage. There is no intracranial mass, mass effect, or edema. There is no hydrocephalus or extra-axial fluid collection. Prominence of the ventricles and sulci reflects parenchymal volume loss greater than expected for age. Chronic left basal ganglia infarct with blood products. Additional patchy and confluent T2 hyperintensity in the supratentorial and pontine white matter is  nonspecific but may reflect moderate chronic microvascular ischemic changes. Vascular: Major vessel flow voids at the skull base are preserved. Skull and upper cervical spine: Normal marrow signal is preserved. Sinuses/Orbits: Mild mucosal thickening. Abnormal signal within both globes likely reflecting proteinaceous material as seen on prior CT imaging. Other: Sella is unremarkable.  Mastoid air cells are clear. IMPRESSION: No evidence of recent infarction, hemorrhage, or mass. Chronic/nonemergent findings detailed above. Parenchymal volume loss and chronic microvascular ischemic changes are age advanced. Electronically Signed   By: Macy Mis M.D.   On: 05/07/2021 11:44   CT CHEST ABDOMEN PELVIS W CONTRAST  Result Date: 05/06/2021 CLINICAL DATA:  Weakness and  abdominal pain, initial encounter EXAM: CT CHEST, ABDOMEN, AND PELVIS WITH CONTRAST TECHNIQUE: Multidetector CT imaging of the chest, abdomen and pelvis was performed following the standard protocol during bolus administration of intravenous contrast. CONTRAST:  25mL ISOVUE-300 IOPAMIDOL (ISOVUE-300) INJECTION 61% COMPARISON:  Chest x-ray from earlier in the same day, CT from 03/30/2021. FINDINGS: CT CHEST FINDINGS Cardiovascular: Thoracic aorta demonstrates a normal branching pattern. Mild atherosclerotic calcifications are noted. No aneurysmal dilatation or dissection is noted. Pulmonary artery is within normal limits without evidence of embolus although timing was not performed for embolus evaluation. Mild coronary calcifications are noted. Mediastinum/Nodes: No mediastinal or hilar adenopathy is noted. The esophagus as visualized is within normal limits. The thoracic inlet is unremarkable. Lungs/Pleura: Bilateral pleural effusions are noted right considerably greater than left. Patchy consolidation is noted consistent with multifocal pneumonia within the lower lobes bilaterally. These changes are relatively stable from prior CT from 03/30/2021. No new parenchymal nodule is noted. The ground-glass attenuation has nearly completely resolved when compared with the prior CT. Musculoskeletal: No acute rib abnormality is noted. No acute bony abnormality is seen. CT ABDOMEN PELVIS FINDINGS Hepatobiliary: Gallbladder is well distended with dependent gallstones. Liver is within normal limits. No obstructive changes are seen. Pancreas: Unremarkable. No pancreatic ductal dilatation or surrounding inflammatory changes. Spleen: Normal in size without focal abnormality. Adrenals/Urinary Tract: Adrenal glands are within normal limits. Kidneys demonstrate a normal enhancement pattern bilaterally. Bladder is well distended. Stomach/Bowel: No obstructive or inflammatory changes of the colon are seen. Appendix is not well  visualized. No inflammatory changes to suggest appendicitis are seen. Small bowel and stomach are within normal limits. Vascular/Lymphatic: Aortic atherosclerosis. No enlarged abdominal or pelvic lymph nodes. Reproductive: Prostate is unremarkable. Other: No abdominal wall hernia or abnormality. No abdominopelvic ascites. Musculoskeletal: No acute or significant osseous findings. IMPRESSION: CT of the chest: Patchy consolidation and bilateral effusions right greater than left similar to that seen on prior CT. This is again consistent with multifocal pneumonia. No new focal abnormality is noted. CT of the abdomen and pelvis: Cholelithiasis without complicating factors. No other focal abnormality is seen. Electronically Signed   By: Inez Catalina M.D.   On: 05/06/2021 20:28   DG Chest Port 1 View  Result Date: 04/27/2021 CLINICAL DATA:  Shortness of breath a EXAM: PORTABLE CHEST 1 VIEW COMPARISON:  04/03/2021 FINDINGS: Stable cardiomediastinal contours. Moderate left and small-moderate right pleural effusions. Left-sided pleural effusion is slightly increased from prior. Patchy bibasilar airspace opacities, left worse than right, also progressed from prior. No pneumothorax. IMPRESSION: Moderate left and small-moderate right pleural effusions with associated bibasilar airspace opacities, progressed from prior. Electronically Signed   By: Davina Poke D.O.   On: 04/27/2021 16:47    Microbiology: Recent Results (from the past 240 hour(s))  Body Fluid Culture     Status: None  Collection Time: 05/03/21 11:53 AM   Specimen: Pleural; Body Fluid   Body Fluid  Result Value Ref Range Status   Body Fluid Culture, Sterile Final report  Final   Organism ID, Bacteria Comment  Final    Comment: No growth in 56 - 72 hours.  Resp Panel by RT-PCR (Flu A&B, Covid) Nasopharyngeal Swab     Status: None   Collection Time: 05/06/21  6:25 PM   Specimen: Nasopharyngeal Swab; Nasopharyngeal(NP) swabs in vial transport  medium  Result Value Ref Range Status   SARS Coronavirus 2 by RT PCR NEGATIVE NEGATIVE Final    Comment: (NOTE) SARS-CoV-2 target nucleic acids are NOT DETECTED.  The SARS-CoV-2 RNA is generally detectable in upper respiratory specimens during the acute phase of infection. The lowest concentration of SARS-CoV-2 viral copies this assay can detect is 138 copies/mL. A negative result does not preclude SARS-Cov-2 infection and should not be used as the sole basis for treatment or other patient management decisions. A negative result may occur with  improper specimen collection/handling, submission of specimen other than nasopharyngeal swab, presence of viral mutation(s) within the areas targeted by this assay, and inadequate number of viral copies(<138 copies/mL). A negative result must be combined with clinical observations, patient history, and epidemiological information. The expected result is Negative.  Fact Sheet for Patients:  EntrepreneurPulse.com.au  Fact Sheet for Healthcare Providers:  IncredibleEmployment.be  This test is no t yet approved or cleared by the [place] FDA and  has been authorized for detection and/or diagnosis of SARS-CoV-2 by FDA under an Emergency Use Authorization (EUA). This EUA will remain  in effect (meaning this test can be used) for the duration of the COVID-19 declaration under Section 564(b)(1) of the Act, 21 U.S.C.section 360bbb-3(b)(1), unless the authorization is terminated  or revoked sooner.       Influenza A by PCR NEGATIVE NEGATIVE Final   Influenza B by PCR NEGATIVE NEGATIVE Final    Comment: (NOTE) The Xpert Xpress SARS-CoV-2/FLU/RSV plus assay is intended as an aid in the diagnosis of influenza from Nasopharyngeal swab specimens and should not be used as a sole basis for treatment. Nasal washings and aspirates are unacceptable for Xpert Xpress SARS-CoV-2/FLU/RSV testing.  Fact Sheet for  Patients: EntrepreneurPulse.com.au  Fact Sheet for Healthcare Providers: IncredibleEmployment.be  This test is not yet approved or cleared by the [place] FDA and has been authorized for detection and/or diagnosis of SARS-CoV-2 by FDA under an Emergency Use Authorization (EUA). This EUA will remain in effect (meaning this test can be used) for the duration of the COVID-19 declaration under Section 564(b)(1) of the Act, 21 U.S.C. section 360bbb-3(b)(1), unless the authorization is terminated or revoked.  Performed at [facility], [place] [address] [place], [place] [address]   MRSA Next Gen by PCR, Nasal     Status: None   Collection Time: 05/08/21  5:36 AM   Specimen: Nasal Mucosa; Nasal Swab  Result Value Ref Range Status   MRSA by PCR Next Gen NOT DETECTED NOT DETECTED Final    Comment: (NOTE) The GeneXpert MRSA Assay (FDA approved for NASAL specimens only), is one component of a comprehensive MRSA colonization surveillance program. It is not intended to diagnose MRSA infection nor to guide or monitor treatment for MRSA infections. Test performance is not FDA approved in patients less than 58 years old. Performed at [facility], [place] [address] [place], [place] [address]      Labs: Basic Metabolic Panel:  Recent Labs  Lab 05/06/21 1820 05/07/21 0500 05/08/21 0211 05/09/21 0229 05/10/21 0252  NA 136 134* 134* 135 134*  K 3.9 3.9 3.3* 3.5 4.2  CL 96* 99 98 98 99  CO2 28 26 28 28 28   GLUCOSE 113* 98 146* 145* 148*  BUN 26* 29* 9 19 14   CREATININE 5.08* 5.51* 2.83* 4.13* 2.94*  CALCIUM 9.4 8.6* 8.2* 8.5* 8.5*  PHOS  --  3.5  --   --   --     Liver Function Tests: Recent Labs  Lab 05/06/21 1820 05/10/21 0252  AST 17 17  ALT 13 11  ALKPHOS 98 100  BILITOT 0.3 0.2*  PROT 7.1 6.1*  ALBUMIN 3.1* 2.6*    Recent Labs  Lab 05/06/21 1820  LIPASE 26    No results for input(s): AMMONIA in the last 168  hours. CBC: Recent Labs  Lab 05/06/21 1820 05/07/21 0500 05/08/21 0211 05/09/21 0229 05/10/21 0252  WBC 13.0* 10.7* 10.1 10.8* 9.6  NEUTROABS  --   --   --   --  6.7  HGB 11.2* 9.4* 9.4* 9.0* 9.3*  HCT 35.9* 31.1* 31.3* 29.4* 30.7*  MCV 91.6 91.2 91.3 90.2 90.3  PLT 368 320 307 285 273    Cardiac Enzymes: Recent Labs  Lab 05/06/21 2351  CKTOTAL 84    BNP: BNP (last 3 results) Recent Labs    03/16/21 1245 05/06/21 2351  BNP 188.6* 147.8*     ProBNP (last 3 results) No results for input(s): PROBNP in the last 8760 hours.  CBG: Recent Labs  Lab 05/07/21 1200 05/07/21 1750 05/07/21 2018 05/08/21 0638  GLUCAP 145* 76 213* 62*        Signed:  Nita Sells MD   [facility] 05/10/2021, 10:56 AM

## 2021-05-10 NOTE — TOC Transition Note (Signed)
Transition of Care [facility]) - CM/SW Discharge Note   Patient Details  Name: Christopher Davila MRN: [id number] Date of Birth: 1970-10-26  Transition of Care [facility] CM/SW Contact:  Vinie Sill, LCSW Phone Number: 05/10/2021, 10:53 AM   Clinical Narrative:     Patient will Discharge to: [facility] Discharge Date: 05/10/2021 Family Notified: [name] Transport [id number]  Per MD patient is ready for discharge. RN, patient, and facility notified of discharge. Discharge Summary sent to facility. RN given number for [phone]. Ambulance transport requested for patient.   Clinical Social Worker signing off.   Final next level of care: Skilled Nursing Facility Barriers to Discharge: Barriers Resolved   Patient Goals and CMS Choice        Discharge Placement              Patient chooses bed at: [facility] Patient to be transferred to facility by: [place] Name of family member notified: [name] Patient and family notified of of transfer: 05/10/21  Discharge Plan and Services                                     Social Determinants of Health (SDOH) Interventions     Readmission Risk Interventions Readmission Risk Prevention Plan 04/06/2021 02/19/2019  Transportation Screening Complete Complete  PCP or Specialist Appt within 5-7 Days - Complete  Home Care Screening - Complete  Medication Review (RN CM) - Complete  Medication Review ([place]) Complete -  PCP or Specialist appointment within 3-5 days of discharge Complete -  [place] or Home Care Consult Complete -  SW Recovery Care/Counseling Consult Complete -  Palliative Care Screening Not Applicable -  Skilled Nursing Facility Complete -  Some recent data might be hidden

## 2021-05-10 NOTE — Progress Notes (Signed)
Report given to [employer] nurse at [place].

## 2021-05-15 ENCOUNTER — Inpatient Hospital Stay: Payer: Medicare Other | Admitting: Student

## 2021-05-17 ENCOUNTER — Non-Acute Institutional Stay: Payer: Medicare Other | Admitting: Hospice

## 2021-05-17 ENCOUNTER — Other Ambulatory Visit: Payer: Self-pay

## 2021-05-17 DIAGNOSIS — Z992 Dependence on renal dialysis: Secondary | ICD-10-CM

## 2021-05-17 DIAGNOSIS — I5032 Chronic diastolic (congestive) heart failure: Secondary | ICD-10-CM

## 2021-05-17 DIAGNOSIS — Z515 Encounter for palliative care: Secondary | ICD-10-CM

## 2021-05-17 DIAGNOSIS — J189 Pneumonia, unspecified organism: Secondary | ICD-10-CM

## 2021-05-17 DIAGNOSIS — N186 End stage renal disease: Secondary | ICD-10-CM

## 2021-05-17 NOTE — Progress Notes (Signed)
[facility] Consult Note Telephone: [phone]  Fax: [phone]  PATIENT NAME: Christopher Davila [address] [place] [address] [phone] (home)  DOB: 01-05-1971 MRN: [id number]  PRIMARY CARE PROVIDER:    [name], MD,  [address] [place] [address] [phone]  REFERRING PROVIDER:   [name], MD [address] [place],  [place] [address] [phone]  RESPONSIBLE PARTY:   Self Emergency contact - cousin - [place]     Name Relation Home Work Mobile   [place] Relative   [phone]   Mathew, Postiglione Relative [phone]          I met face to face with patient at facility. Palliative Care was asked to follow this patient by consultation request of  [name], MD to address advance care planning, complex medical decision making and goals of care clarification. Patient endorsed palliative service. This is the initial visit.    ASSESSMENT AND / RECOMMENDATIONS:   Advance Care Planning: Our advance care planning conversation included a discussion about:    The value and importance of advance care planning  Difference between Hospice and Palliative care Exploration of goals of care in the event of a sudden injury or illness  Identification and preparation of a healthcare agent  Review and updating or creation of an  advance directive document . Decision not to resuscitate or to de-escalate disease focused treatments due to poor prognosis.  CODE STATUS: Patient affirmed he is a Full code  Goals of Care: Goals include to maximize quality of life and symptom management  I spent 20  minutes providing this initial consultation. More than 50% of the time in this consultation was spent on counseling patient and coordinating  communication. --------------------------------------------------------------------------------------------------------------------------------------  Symptom Management/Plan: Multi focal Pneumonia: Patient has significantly improved. Last dose of Levaquin today, Continue Oxygen supplementation.  ESRD: Managed with Dialysis Mon Wed Friday CHF: Continue on Torsemide as ordered. Check weight daily and report weight gain of 3 Ibs in a day or 5 pounds in a week. Fluid restriction 1500cc, no added salt.  Routine CBC BMP B12 Depression: managed wih Zoloft. Followed by Psych Follow up: Palliative care will continue to follow for complex medical decision making, advance care planning, and clarification of goals. Return 6 weeks or prn.Encouraged to call provider sooner with any concerns.   Family /Caregiver/Community Supports: Patient in SNF for ongoing care  HOSPICE ELIGIBILITY/DIAGNOSIS: TBD  Chief Complaint: Initial Palliative care visit  HISTORY OF PRESENT ILLNESS:  Christopher Davila is a 50 y.o. year old male  with multiple medical conditions including  multifocal pneumonia likely secondary to immunocomprmonised state from recent COVID-19 infection and use of steroid. Last hospitalization 11/20-11/24/2022 chart review indicates his pneumonia was superimposed on chronic respiratory failure and transudative pleural effusion. Patient was treated and discharged to SNF for rehab; completing last dose of Levaquin today. History of CHF with preserved ejection fraction, chronic respiratory failure with hypoxia, Type 2 DM, HTN, Left shoulder osteoarthritis, Depression.   History obtained from review of EMR, discussion with primary team, caregiver, family and/or Mr. Minney.  Review and summarization of Epic records shows history from other than patient. Rest of 10 point ROS asked and negative.  I reviewed, as needed, available labs, patient records, imaging, studies and related documents from the EMR.  Physical  Exam: Constitutional: NAD General: Well groomed, cooperative EYES: anicteric sclera, lids intact, no discharge  ENMT: Moist mucous membrane CV: S1 S2, RRR, trace edema  to bil ankle Pulmonary: LCTA, no increased work of breathing, no cough, oxygen supplementation 2 L/Min Abdomen: active BS + 4 quadrants, soft and non tender GU: no suprapubic tenderness MSK: weakness, sarcopenia, limited ROM Skin: warm and dry, no rashes or wounds on visible skin Neuro:  weakness, otherwise non focal Psych: non-anxious affect Hem/lymph/immuno: no widespread bruising   PAST MEDICAL HISTORY:  Active Ambulatory Problems    Diagnosis Date Noted   Type 2 diabetes mellitus with hypoglycemia ([facility]) 06/26/2012   Essential hypertension    Hyperlipidemia    Gastroparesis due to DM ([facility])    Hyperglycemia    Alcohol use    Hypertensive urgency 04/17/2019   Hypokalemia 04/17/2019   Normocytic anemia 04/17/2019   Acute loss of vision, right 06/16/2019   Hyponatremia 09/19/2019   Homeless 09/19/2019   Anemia of chronic disease 09/19/2019   Vision loss, left eye 09/19/2019   Vision loss of left eye 09/21/2019   Acute on chronic diastolic CHF (congestive heart failure) ([facility]) 09/27/2019   Weakness 10/05/2019   Diarrhea 49/44/9675   Metabolic acidosis, increased anion gap 10/13/2019   Acute urinary retention 10/13/2019   ESRD on dialysis [facility])    Heart failure with preserved ejection fraction ([facility])    Goals of care, counseling/discussion    Palliative care encounter    Pressure injury of skin 11/06/2019   Acute respiratory failure with hypoxia ([facility]) 03/26/2021   History of thoracentesis    S/P thoracentesis    Shortness of breath    COVID-19    Hypoxia    End-stage renal disease on hemodialysis ([facility])    Hypervolemia    Multifocal pneumonia 05/06/2021   Resolved Ambulatory Problems    Diagnosis Date Noted   Hypertension 06/26/2012   Pancreatitis 06/26/2012   Weight loss 06/26/2012   Oral  candidiasis 06/26/2012   Abdominal pain 12/28/2012   Chest pain 02/14/2014   Nausea    Dyspnea    Heart failure ([facility]) 02/13/2019   Epigastric abdominal pain    Hypoxia    Wheezing    CKD (chronic kidney disease) stage 4, GFR 15-29 ml/min (HCC)    Acute kidney injury superimposed on chronic kidney disease ([facility])    Acute renal failure superimposed on stage 3 chronic kidney disease ([place]) 04/17/2019   Hypoalbuminemia 04/17/2019   Acute kidney injury superimposed on chronic kidney disease ([facility]) 09/19/2019   CHF (congestive heart failure) ([place]) 09/26/2019   Past Medical History:  Diagnosis Date   Anemia    Blindness 09/04/2019   Diabetes mellitus    ESRD (end stage renal disease) ([facility])    Neuropathy of lower extremity     SOCIAL HX:  Social History   Tobacco Use   Smoking status: Former    Packs/day: 0.00    Types: Cigarettes    Quit date: 03/22/2015    Years since quitting: 6.1   Smokeless tobacco: Never  Substance Use Topics   Alcohol use: Not Currently     FAMILY HX:  Family History  Problem Relation Age of Onset   Diabetes Mother    Lung disease Mother    Hypertension Mother    Diabetes Maternal Aunt    CAD Maternal Aunt    CAD Cousin       ALLERGIES:  Allergies  Allergen Reactions   Morphine And Related Itching and Other (See Comments)    Pt prefers not to be given this drug      PERTINENT MEDICATIONS:  Outpatient Encounter Medications as  of 05/17/2021  Medication Sig   albuterol (VENTOLIN HFA) 108 (90 Base) MCG/ACT inhaler Inhale 2 puffs into the lungs every 4 (four) hours as needed for shortness of breath.   amLODipine (NORVASC) 10 MG tablet Take 0.5 tablets (5 mg total) by mouth daily.   aspirin EC 81 MG tablet Take 1 tablet (81 mg total) by mouth daily.   atorvastatin (LIPITOR) 40 MG tablet Take 1 tablet (40 mg total) by mouth daily.   atropine 1 % ophthalmic solution Place 1 drop into both eyes in the morning and at bedtime.   brimonidine (ALPHAGAN  P) 0.1 % SOLN Place 1 drop into the left eye in the morning and at bedtime.   calcium acetate (PHOSLO) 667 MG capsule Take 1 capsule (667 mg total) by mouth 3 (three) times daily with meals.   Dextran 70-Hypromellose (TEARS PURE) 0.1-0.3 % SOLN Place 1 drop into both eyes in the morning, at noon, in the evening, and at bedtime.   dorzolamide-timolol (COSOPT) 22.3-6.8 MG/ML ophthalmic solution Place 1 drop into the left eye 2 (two) times daily.   ferrous sulfate 325 (65 FE) MG tablet Take 325 mg by mouth daily.   guaiFENesin (ROBITUSSIN) 100 MG/5ML liquid Take 100 mg by mouth every 4 (four) hours as needed for cough.   latanoprost (XALATAN) 0.005 % ophthalmic solution Place 1 drop into the left eye at bedtime.   losartan (COZAAR) 100 MG tablet Take 1 tablet (100 mg total) by mouth daily.   Melatonin 10 MG CAPS Take 10 mg by mouth at bedtime.   olopatadine (PATANOL) 0.1 % ophthalmic solution Place 2 drops into both eyes daily.   ondansetron (ZOFRAN) 4 MG tablet Take 4 mg by mouth every 6 (six) hours as needed for nausea or vomiting.   oxyCODONE-acetaminophen (PERCOCET/ROXICET) 5-325 MG tablet Take 1 tablet by mouth See admin instructions. 1 tablet once daily every Tuesday, Thursday, Saturday, and Sunday. May take an additional 1 tablet every 6 hours as needed on Monday's Wednesday's, and Friday's.   OXYGEN Inhale 3 L into the lungs continuous.   polyethylene glycol (MIRALAX / GLYCOLAX) 17 g packet Take 17 g by mouth daily.   prednisoLONE acetate (PRED FORTE) 1 % ophthalmic suspension Place 1 drop into both eyes 4 (four) times daily.   pregabalin (LYRICA) 25 MG capsule Take 1 capsule (25 mg total) by mouth 2 (two) times daily.   senna (SENOKOT) 8.6 MG TABS tablet Take 2 tablets by mouth at bedtime.   sertraline (ZOLOFT) 25 MG tablet Take 1 tablet (25 mg total) by mouth daily. On Monday's, Wednesday's, and Friday's, take after dialysis. All other days take in the morning.   torsemide (DEMADEX) 20 MG  tablet Take 80 mg by mouth 2 (two) times daily.   vitamin B-12 1000 MCG tablet Take 1 tablet (1,000 mcg total) by mouth daily.   Vitamin D, Ergocalciferol, (DRISDOL) 1.25 MG (50000 UNIT) CAPS capsule Take 50,000 Units by mouth every 7 (seven) days. Sunday's   No facility-administered encounter medications on file as of 05/17/2021.     Thank you for the opportunity to participate in the care of Mr. Poplaski.  The palliative care team will continue to follow. Please call our office at [phone] if we can be of additional assistance.   Note: Portions of this note were generated with [employer]. Dictation errors may occur despite best attempts at proofreading.  Teodoro Spray, NP

## 2021-06-14 ENCOUNTER — Ambulatory Visit: Payer: Medicare Other | Admitting: Physician Assistant

## 2021-06-25 NOTE — Progress Notes (Signed)
Cardiology Office Note:    Date:  06/26/2021   ID:  Christopher Davila, DOB 06/26/1970, MRN [id number]  PCP:  Christopher Band, MD [facility] Cardiologist: Christopher Martinique, MD   Reason for visit: Follow-up  History of Present Illness:    Christopher Davila is a 51 y.o. male with a hx of  ESRD on HD MWF, HTN, anemia, left eye blindness, DM2 with gastroparesis, diastolic heart failure, pancreatitis.  He was admitted 03/2021 with [facility]. He required BIPAP and up to 20L of supplemental oxygen.   He was last seen by Christopher Davila on April 26, 2021.  His breathing had improved since hospital discharge.  He was riding his stationary bike and walking to [place] for exercise.  He was euvolemic on exam.  His hydralazine was increased for blood pressure control recommended follow-up in 6 months.  He was readmitted to the hospital November 20 through 24, 2022.  He was treated for multifocal pneumonia.  Patient had ongoing SOB despite recent thoracentesis and antibiotics.    Patient initial palliative care consult on May 17, 2021.  Today, patient states he feels well.  He states he weaned down from 4 L to 1 L/min.  He denies shortness of breath walking to the door.  He believes his weight has been stable.  He states he felt a little "weird" yesterday at dialysis with lower blood pressure.  But, he typically tolerates dialysis well.  He has chronic pedal edema, left chronically greater than right.  He says he has been told to wear TED hose.  He denies PND and orthopnea.  No chest pain, significant lightheadedness or syncope.  He is currently in a wheelchair.    Past Medical History:  Diagnosis Date   Anemia    Blindness 09/04/2019   CHF (congestive heart failure) (HCC)    Diabetes mellitus    Diarrhea 10/14/2019   Epigastric abdominal pain    ESRD (end stage renal disease) ([place])    Gastroparesis due to DM ([facility])    Hypertension    Hypoalbuminemia 04/17/2019   Hypoxia    Neuropathy of  lower extremity    Oral candidiasis 06/26/2012   Pancreatitis 06/26/2012   Weight loss 06/26/2012    Past Surgical History:  Procedure Laterality Date   AV FISTULA PLACEMENT Right 10/29/2019   Procedure: RIGHT ARM BRACHIOBASILIC ARTERIOVENOUS (AV) FISTULA CREATION;  Surgeon: Rosetta Posner, MD;  Location: [facility] OR;  Service: Vascular;  Laterality: Right;   [place] Right 01/05/2020   Procedure: RIGHT ARM SECOND STAGE [place];  Surgeon: Rosetta Posner, MD;  Location: [facility] OR;  Service: Vascular;  Laterality: Right;   EYE SURGERY     IR FLUORO GUIDE CV LINE RIGHT  10/28/2019   IR US GUIDE VASC ACCESS RIGHT  10/28/2019    Current Medications: Current Meds  Medication Sig   albuterol (VENTOLIN HFA) 108 (90 Base) MCG/ACT inhaler Inhale 2 puffs into the lungs every 4 (four) hours as needed for shortness of breath.   amLODipine (NORVASC) 10 MG tablet Take 0.5 tablets (5 mg total) by mouth daily.   aspirin EC 81 MG tablet Take 1 tablet (81 mg total) by mouth daily.   atorvastatin (LIPITOR) 40 MG tablet Take 1 tablet (40 mg total) by mouth daily.   atropine 1 % ophthalmic solution Place 1 drop into both eyes in the morning and at bedtime.   brimonidine (ALPHAGAN P) 0.1 % SOLN Place 1 drop into the left eye  in the morning and at bedtime.   calcium acetate (PHOSLO) 667 MG capsule Take 1 capsule (667 mg total) by mouth 3 (three) times daily with meals.   Dextran 70-Hypromellose (TEARS PURE) 0.1-0.3 % SOLN Place 1 drop into both eyes in the morning, at noon, in the evening, and at bedtime.   dorzolamide-timolol (COSOPT) 22.3-6.8 MG/ML ophthalmic solution Place 1 drop into the left eye 2 (two) times daily.   ferrous sulfate 325 (65 FE) MG tablet Take 325 mg by mouth daily.   guaiFENesin (ROBITUSSIN) 100 MG/5ML liquid Take 100 mg by mouth every 4 (four) hours as needed for cough.   latanoprost (XALATAN) 0.005 % ophthalmic solution Place 1 drop into the left eye at bedtime.    losartan (COZAAR) 100 MG tablet Take 1 tablet (100 mg total) by mouth daily.   Melatonin 10 MG CAPS Take 10 mg by mouth at bedtime.   olopatadine (PATANOL) 0.1 % ophthalmic solution Place 2 drops into both eyes daily.   ondansetron (ZOFRAN) 4 MG tablet Take 4 mg by mouth every 6 (six) hours as needed for nausea or vomiting.   oxyCODONE-acetaminophen (PERCOCET/ROXICET) 5-325 MG tablet Take 1 tablet by mouth See admin instructions. 1 tablet once daily every Tuesday, Thursday, Saturday, and Sunday. May take an additional 1 tablet every 6 hours as needed on Monday's Wednesday's, and Friday's.   OXYGEN Inhale 3 L into the lungs continuous.   polyethylene glycol (MIRALAX / GLYCOLAX) 17 g packet Take 17 g by mouth daily.   prednisoLONE acetate (PRED FORTE) 1 % ophthalmic suspension Place 1 drop into both eyes 4 (four) times daily.   pregabalin (LYRICA) 25 MG capsule Take 1 capsule (25 mg total) by mouth 2 (two) times daily.   senna (SENOKOT) 8.6 MG TABS tablet Take 2 tablets by mouth at bedtime.   sertraline (ZOLOFT) 25 MG tablet Take 1 tablet (25 mg total) by mouth daily. On Monday's, Wednesday's, and Friday's, take after dialysis. All other days take in the morning.   torsemide (DEMADEX) 20 MG tablet Take 80 mg by mouth 2 (two) times daily.   vitamin B-12 1000 MCG tablet Take 1 tablet (1,000 mcg total) by mouth daily.   Vitamin D, Ergocalciferol, (DRISDOL) 1.25 MG (50000 UNIT) CAPS capsule Take 50,000 Units by mouth every 7 (seven) days. Sunday's     Allergies:   Morphine and related   Social History   Socioeconomic History   Marital status: Single    Spouse name: Not on file   Number of children: Not on file   Years of education: Not on file   Highest education level: Not on file  Occupational History   Occupation: dish washer, unemployed  Tobacco Use   Smoking status: Former    Packs/day: 0.00    Types: Cigarettes    Quit date: 03/22/2015    Years since quitting: 6.2   Smokeless  tobacco: Never  Vaping Use   Vaping Use: Never used  Substance and Sexual Activity   Alcohol use: Not Currently   Drug use: No   Sexual activity: Not on file  Other Topics Concern   Not on file  Social History Narrative   Not on file   Social Determinants of Health   Financial Resource Strain: Not on file  Food Insecurity: Not on file  Transportation Needs: Not on file  Physical Activity: Not on file  Stress: Not on file  Social Connections: Not on file     Family History: The patient's  family history includes CAD in his cousin and maternal aunt; Diabetes in his maternal aunt and mother; Hypertension in his mother; Lung disease in his mother.  ROS:   Please see the history of present illness.     EKGs/Labs/Other Studies Reviewed:    EKG:  The ekg ordered today demonstrates sinus rhythm, rightward axis, prolonged QT, heart rate 90, PR interval 160 ms, QRS duration 94 ms, QT 414 ms, QTC 506 ms.  Recent Labs: 03/31/2021: Magnesium 2.6 05/06/2021: B Natriuretic Peptide 147.8; TSH 2.649 05/10/2021: ALT 11; BUN 14; Creatinine, Ser 2.94; Hemoglobin 9.3; Platelets 273; Potassium 4.2; Sodium 134   Recent Lipid Panel Lab Results  Component Value Date/Time   CHOL 98 (L) 04/26/2021 08:49 AM   TRIG 55 04/26/2021 08:49 AM   HDL 56 04/26/2021 08:49 AM   LDLCALC 29 04/26/2021 08:49 AM    Physical Exam:    VS:  BP (!) 162/83    Pulse 90    Ht 5\' 3"  (1.6 m)    Wt 122 lb (55.3 kg)    SpO2 93%    BMI 21.61 kg/m    No data found.  Wt Readings from Last 3 Encounters:  06/26/21 122 lb (55.3 kg)  05/09/21 121 lb 0.5 oz (54.9 kg)  05/03/21 127 lb 12.8 oz (58 kg)     GEN:  no acute distress, wearing oxygen, wearing sunglasses, in wheelchair HEENT: Normal NECK: No JVD; No carotid bruits CARDIAC: RRR, no murmurs, rubs, gallops RESPIRATORY:  Clear to auscultation without rales, wheezing or rhonchi  ABDOMEN: Soft, non-tender, non-distended MUSCULOSKELETAL: 1-2+ pedal edema (L>R), no  edema above shins SKIN: Warm and dry NEUROLOGIC:  Alert and oriented PSYCHIATRIC:  Normal affect     ASSESSMENT AND PLAN   Chronic diastolic heart failure, near euvolemic -Volume management by HD -Continue torsemide. -Recommend compression stockings for pedal edema.  Recommend increasing mobility, walking in the halls 2-3 times daily. -No SGLT2i due to ESRD.   Hypertension, BP elevated today -Asked to send [place] a 2-week blood pressure log for review.  Currently taking amlodipine 2.25 mg daily, hydralazine 25 mg twice a day and losartan 100 mg daily.    Hyperlipidemia, well controlled -LDL 29 in November 2022.  Continue Lipitor -Discussed cholesterol lowering diets - Mediterranean diet, DASH diet, vegetarian diet, low-carbohydrate diet and avoidance of trans fats.  Discussed healthier choice substitutes.  Nuts, high-fiber foods, and fiber supplements may also improve lipids.    Disposition - Follow-up in 3 months with Dr. Martinique given recent hospitalizations.         Medication Adjustments/Labs and Tests Ordered: Current medicines are reviewed at length with the patient today.  Concerns regarding medicines are outlined above.  Orders Placed This Encounter  Procedures   EKG 12-Lead   No orders of the defined types were placed in this encounter.   Patient Instructions  Medication Instructions:  No Changes *If you need a refill on your cardiac medications before your next appointment, please call your pharmacy*   Lab Work: No Labs If you have labs (blood work) drawn today and your tests are completely normal, you will receive your results only by: [facility] (if you have MyChart) OR A paper copy in the mail If you have any lab test that is abnormal or we need to change your treatment, we will call you to review the results.   Testing/Procedures: No Testing    Follow-Up: At [facility], you and your health needs are our priority.  As part of our continuing  mission to provide you with exceptional heart care, we have created designated Provider Care Teams.  These Care Teams include your primary Cardiologist (physician) and Advanced Practice Providers (APPs -  Physician Assistants and Nurse Practitioners) who all work together to provide you with the care you need, when you need it.  We recommend signing up for the patient portal called "MyChart".  Sign up information is provided on this After Visit Summary.  MyChart is used to connect with patients for Virtual Visits (Telemedicine).  Patients are able to view lab/test results, encounter notes, upcoming appointments, etc.  Non-urgent messages can be sent to your provider as well.   To learn more about what you can do with MyChart, go to [email or web address].    Your next appointment:   3 month(s)  The format for your next appointment:   In Person  Provider:   Peter Martinique, MD     Other Instructions Recommend wearing compression stocking. Increase physical activity. Log Blood pressure Daily.    Signed, Warren Lacy, PA-C  06/26/2021 11:25 AM    [facility]

## 2021-06-26 ENCOUNTER — Ambulatory Visit (INDEPENDENT_AMBULATORY_CARE_PROVIDER_SITE_OTHER): Payer: Medicare Other | Admitting: Physician Assistant

## 2021-06-26 ENCOUNTER — Encounter: Payer: Self-pay | Admitting: Physician Assistant

## 2021-06-26 ENCOUNTER — Other Ambulatory Visit: Payer: Self-pay

## 2021-06-26 VITALS — BP 162/83 | HR 90 | Ht 63.0 in | Wt 122.0 lb

## 2021-06-26 DIAGNOSIS — E785 Hyperlipidemia, unspecified: Secondary | ICD-10-CM | POA: Diagnosis not present

## 2021-06-26 DIAGNOSIS — I5032 Chronic diastolic (congestive) heart failure: Secondary | ICD-10-CM | POA: Diagnosis not present

## 2021-06-26 DIAGNOSIS — I10 Essential (primary) hypertension: Secondary | ICD-10-CM | POA: Diagnosis not present

## 2021-06-26 NOTE — Patient Instructions (Signed)
Medication Instructions:  No Changes *If you need a refill on your cardiac medications before your next appointment, please call your pharmacy*   Lab Work: No Labs If you have labs (blood work) drawn today and your tests are completely normal, you will receive your results only by: [place] (if you have MyChart) OR A paper copy in the mail If you have any lab test that is abnormal or we need to change your treatment, we will call you to review the results.   Testing/Procedures: No Testing    Follow-Up: At [facility], you and your health needs are our priority.  As part of our continuing mission to provide you with exceptional heart care, we have created designated Provider Care Teams.  These Care Teams include your primary Cardiologist (physician) and Advanced Practice Providers (APPs -  Physician Assistants and Nurse Practitioners) who all work together to provide you with the care you need, when you need it.  We recommend signing up for the patient portal called "MyChart".  Sign up information is provided on this After Visit Summary.  MyChart is used to connect with patients for Virtual Visits (Telemedicine).  Patients are able to view lab/test results, encounter notes, upcoming appointments, etc.  Non-urgent messages can be sent to your provider as well.   To learn more about what you can do with MyChart, go to [email or web address].    Your next appointment:   3 month(s)  The format for your next appointment:   In Person  Provider:   Peter Martinique, MD     Other Instructions Recommend wearing compression stocking. Increase physical activity. Log Blood pressure Daily.

## 2021-07-31 ENCOUNTER — Non-Acute Institutional Stay: Payer: Medicare Other | Admitting: Hospice

## 2021-07-31 ENCOUNTER — Ambulatory Visit (INDEPENDENT_AMBULATORY_CARE_PROVIDER_SITE_OTHER): Payer: Medicare Other | Admitting: Podiatry

## 2021-07-31 ENCOUNTER — Encounter: Payer: Self-pay | Admitting: Podiatry

## 2021-07-31 ENCOUNTER — Other Ambulatory Visit: Payer: Self-pay

## 2021-07-31 DIAGNOSIS — Z992 Dependence on renal dialysis: Secondary | ICD-10-CM

## 2021-07-31 DIAGNOSIS — N186 End stage renal disease: Secondary | ICD-10-CM

## 2021-07-31 DIAGNOSIS — M79674 Pain in right toe(s): Secondary | ICD-10-CM

## 2021-07-31 DIAGNOSIS — B351 Tinea unguium: Secondary | ICD-10-CM

## 2021-07-31 DIAGNOSIS — G4700 Insomnia, unspecified: Secondary | ICD-10-CM

## 2021-07-31 DIAGNOSIS — M79675 Pain in left toe(s): Secondary | ICD-10-CM

## 2021-07-31 DIAGNOSIS — Z515 Encounter for palliative care: Secondary | ICD-10-CM

## 2021-07-31 DIAGNOSIS — R0602 Shortness of breath: Secondary | ICD-10-CM

## 2021-07-31 DIAGNOSIS — L989 Disorder of the skin and subcutaneous tissue, unspecified: Secondary | ICD-10-CM | POA: Insufficient documentation

## 2021-07-31 DIAGNOSIS — E11649 Type 2 diabetes mellitus with hypoglycemia without coma: Secondary | ICD-10-CM | POA: Diagnosis not present

## 2021-07-31 NOTE — Progress Notes (Signed)
[facility] Consult Note Telephone: [phone]  Fax: [phone]  PATIENT NAME: Christopher Davila [id number] [phone] (home)  DOB: 1971-04-12 MRN: [id number]  PRIMARY CARE PROVIDER:    [name], MD,  [place] [address] [phone]  REFERRING PROVIDER:   [name], MD [address] [place],  [place] [address] [phone]  RESPONSIBLE PARTY:   Self Emergency contact - cousin - [place]     Name Relation Home Work Mobile   [place] Relative   [phone]   [place], [place] Relative [phone]          I met face to face with patient at facility. Palliative Care was asked to follow this patient by consultation request of  [name], MD to address advance care planning, complex medical decision making and goals of care clarification. Patient endorsed palliative service. This is the initial visit.    ASSESSMENT AND / RECOMMENDATIONS:   CODE STATUS: Patient affirmed he is a Full code  Goals of Care: Goals include to maximize quality of life and symptom management  Symptom Management/Plan: Shortness of breath: Continue on oxygen supplementation, taper to wean; currently on 1 L/min.  Albuterol is on hand as needed ESRD: Managed with Dialysis Mon Wed Friday CHF: Continue on Torsemide as ordered. Check weight daily and report weight gain of 3 Ibs in a day or 5 pounds in a week. Fluid restriction 1500cc, no added salt.  Depression: managed wih Zoloft. Followed by Psych Insomnia: New onset.  Managed with melatonin 10 mg at bedtime. Follow up: Palliative care will continue to follow for complex medical decision making, advance care planning, and clarification of goals. Return 6 weeks or prn.Encouraged to call provider sooner with any concerns.   Family /Caregiver/Community Supports: Patient in SNF for ongoing care  HOSPICE  ELIGIBILITY/DIAGNOSIS: TBD  Chief Complaint: Initial Palliative care visit  HISTORY OF PRESENT ILLNESS:  Christopher Davila is a 51 y.o. year old male  with multiple medical conditions including CHF with preserved ejection fraction, chronic respiratory failure with hypoxia, hypertension, low left shoulder osteoarthritis, depression.  History of multifocal pneumonia likely secondary to immunocomprmonised state from recent COVID-19 infection and use of steroid. Last hospitalization 11/20-11/24/2022 chart review indicates his pneumonia was superimposed on chronic respiratory failure and transudative pleural effusion. Patient reports shortness of breath getting better, reports oxygen was reduced, currently on 1 L/min with plan to wean him off with. History obtained from review of EMR, discussion with primary team, caregiver, family and/or Mr. Mccannon.  Review and summarization of Epic records shows history from other than patient. Rest of 10 point ROS asked and negative.  I reviewed, as needed, available labs, patient records, imaging, studies and related documents from the EMR.  Physical Exam: Constitutional: NAD General: Well groomed, cooperative EYES: anicteric sclera, lids intact, no discharge  ENMT: Moist mucous membrane CV: S1 S2, RRR, trace edema to bil ankle Pulmonary: LCTA, no increased work of breathing, no cough, oxygen supplementation 1 L/Min Abdomen: active BS + 4 quadrants, soft and non tender GU: no suprapubic tenderness MSK: weakness, limited ROM Skin: warm and dry, no rashes or wounds on visible skin Neuro:  weakness, otherwise non focal Psych: non-anxious affect Hem/lymph/immuno: no widespread bruising   PAST MEDICAL HISTORY:  Active Ambulatory Problems    Diagnosis Date Noted   Type 2 diabetes mellitus with hypoglycemia ([facility]) 06/26/2012   Essential hypertension    Hyperlipidemia  Gastroparesis due to DM ([place])    Hyperglycemia    Alcohol use    Hypertensive urgency  04/17/2019   Hypokalemia 04/17/2019   Normocytic anemia 04/17/2019   Acute loss of vision, right 06/16/2019   Hyponatremia 09/19/2019   Homeless 09/19/2019   Anemia of chronic disease 09/19/2019   Vision loss, left eye 09/19/2019   Vision loss of left eye 09/21/2019   Acute on chronic diastolic CHF (congestive heart failure) ([facility]) 09/27/2019   Weakness 10/05/2019   Diarrhea 72/62/0355   Metabolic acidosis, increased anion gap 10/13/2019   Acute urinary retention 10/13/2019   ESRD on dialysis [facility])    Heart failure with preserved ejection fraction ([place])    Goals of care, counseling/discussion    Palliative care encounter    Pressure injury of skin 11/06/2019   Acute respiratory failure with hypoxia ([place]) 03/26/2021   History of thoracentesis    S/P thoracentesis    Shortness of breath    COVID-19    Hypoxia    End-stage renal disease on hemodialysis (HCC)    Hypervolemia    Multifocal pneumonia 05/06/2021   Resolved Ambulatory Problems    Diagnosis Date Noted   Hypertension 06/26/2012   Pancreatitis 06/26/2012   Weight loss 06/26/2012   Oral candidiasis 06/26/2012   Abdominal pain 12/28/2012   Chest pain 02/14/2014   Nausea    Dyspnea    Heart failure ([place]) 02/13/2019   Epigastric abdominal pain    Hypoxia    Wheezing    CKD (chronic kidney disease) stage 4, GFR 15-29 ml/min (HCC)    Acute kidney injury superimposed on chronic kidney disease ([place])    Acute renal failure superimposed on stage 3 chronic kidney disease ([place]) 04/17/2019   Hypoalbuminemia 04/17/2019   Acute kidney injury superimposed on chronic kidney disease ([place]) 09/19/2019   CHF (congestive heart failure) ([place]) 09/26/2019   Past Medical History:  Diagnosis Date   Anemia    Blindness 09/04/2019   Diabetes mellitus    ESRD (end stage renal disease) ([place])    Neuropathy of lower extremity     SOCIAL HX:  Social History   Tobacco Use   Smoking status: Former    Packs/day: 0.00    Types:  Cigarettes    Quit date: 03/22/2015    Years since quitting: 6.3   Smokeless tobacco: Never  Substance Use Topics   Alcohol use: Not Currently     FAMILY HX:  Family History  Problem Relation Age of Onset   Diabetes Mother    Lung disease Mother    Hypertension Mother    Diabetes Maternal Aunt    CAD Maternal Aunt    CAD Cousin       ALLERGIES:  Allergies  Allergen Reactions   Morphine And Related Itching and Other (See Comments)    Pt prefers not to be given this drug      PERTINENT MEDICATIONS:  Outpatient Encounter Medications as of 07/31/2021  Medication Sig   albuterol (VENTOLIN HFA) 108 (90 Base) MCG/ACT inhaler Inhale 2 puffs into the lungs every 4 (four) hours as needed for shortness of breath.   amLODipine (NORVASC) 10 MG tablet Take 0.5 tablets (5 mg total) by mouth daily.   aspirin EC 81 MG tablet Take 1 tablet (81 mg total) by mouth daily.   atorvastatin (LIPITOR) 40 MG tablet Take 1 tablet (40 mg total) by mouth daily.   atropine 1 % ophthalmic solution Place 1 drop into both eyes in the  morning and at bedtime.   brimonidine (ALPHAGAN P) 0.1 % SOLN Place 1 drop into the left eye in the morning and at bedtime.   calcium acetate (PHOSLO) 667 MG capsule Take 1 capsule (667 mg total) by mouth 3 (three) times daily with meals.   Dextran 70-Hypromellose (TEARS PURE) 0.1-0.3 % SOLN Place 1 drop into both eyes in the morning, at noon, in the evening, and at bedtime.   dorzolamide-timolol (COSOPT) 22.3-6.8 MG/ML ophthalmic solution Place 1 drop into the left eye 2 (two) times daily.   ferrous sulfate 325 (65 FE) MG tablet Take 325 mg by mouth daily.   guaiFENesin (ROBITUSSIN) 100 MG/5ML liquid Take 100 mg by mouth every 4 (four) hours as needed for cough.   hydrALAZINE (APRESOLINE) 25 MG tablet Take 25 mg by mouth 2 (two) times daily.   latanoprost (XALATAN) 0.005 % ophthalmic solution Place 1 drop into the left eye at bedtime.   losartan (COZAAR) 100 MG tablet Take 1  tablet (100 mg total) by mouth daily.   Melatonin 10 MG CAPS Take 10 mg by mouth at bedtime.   olopatadine (PATANOL) 0.1 % ophthalmic solution Place 2 drops into both eyes daily.   ondansetron (ZOFRAN) 4 MG tablet Take 4 mg by mouth every 6 (six) hours as needed for nausea or vomiting.   oxyCODONE-acetaminophen (PERCOCET/ROXICET) 5-325 MG tablet Take 1 tablet by mouth See admin instructions. 1 tablet once daily every Tuesday, Thursday, Saturday, and Sunday. May take an additional 1 tablet every 6 hours as needed on Monday's Wednesday's, and Friday's.   OXYGEN Inhale 3 L into the lungs continuous.   polyethylene glycol (MIRALAX / GLYCOLAX) 17 g packet Take 17 g by mouth daily.   prednisoLONE acetate (PRED FORTE) 1 % ophthalmic suspension Place 1 drop into both eyes 4 (four) times daily.   pregabalin (LYRICA) 25 MG capsule Take 1 capsule (25 mg total) by mouth 2 (two) times daily.   senna (SENOKOT) 8.6 MG TABS tablet Take 2 tablets by mouth at bedtime.   sertraline (ZOLOFT) 25 MG tablet Take 1 tablet (25 mg total) by mouth daily. On Monday's, Wednesday's, and Friday's, take after dialysis. All other days take in the morning.   torsemide (DEMADEX) 20 MG tablet Take 80 mg by mouth 2 (two) times daily.   vitamin B-12 1000 MCG tablet Take 1 tablet (1,000 mcg total) by mouth daily.   Vitamin D, Ergocalciferol, (DRISDOL) 1.25 MG (50000 UNIT) CAPS capsule Take 50,000 Units by mouth every 7 (seven) days. Sunday's   No facility-administered encounter medications on file as of 07/31/2021.   I spent 45 minutes providing this consultation; time includes time spent with patient/family, chart review and documentation.  More than 50% of the time in this consultation was spent on care coordination/communication.  Thank you for the opportunity to participate in the care of Mr. Morad.  The palliative care team will continue to follow. Please call our office at [phone] if we can be of additional assistance.    Note: Portions of this note were generated with [employer]. Dictation errors may occur despite best attempts at proofreading.  Teodoro Spray, NP

## 2021-07-31 NOTE — Progress Notes (Signed)
This patient returns to my office for at risk foot care.  This patient requires this care by a professional since this patient will be at risk due to having ESRD and type 2 DM.  This patient is unable to cut nails himself since the patient cannot reach his nails.These nails are painful walking and wearing shoes.  He presents to the office with a caregiver from his home.  His caregiver says he has skin lesion between his 1.2 toes right foot.  This patient presents for at risk foot care today.  General Appearance  Alert, conversant and in no acute stress.  Vascular  Dorsalis pedis and posterior tibial  pulses are palpable  bilaterally.  Capillary return is within normal limits  bilaterally. Temperature is within normal limits  bilaterally.  Neurologic  Senn-Weinstein monofilament wire test within normal limits  bilaterally. Muscle power within normal limits bilaterally.  Nails Thick disfigured discolored nails with subungual debris  from hallux to fifth toes bilaterally. No evidence of bacterial infection or drainage bilaterally.  Orthopedic  No limitations of motion  feet .  No crepitus or effusions noted.  No bony pathology or digital deformities noted.  Skin  no porokeratosis noted bilaterally. Break in skin  1st interspace right foot.  No infection or drainage noted.       Onychomycosis  Pain in right toes  Pain in left toes  Skin lesion 1st interspace right foot.  Consent was obtained for treatment procedures.   Mechanical debridement of nails 1-5  bilaterally performed with a nail nipper.  Filed with dremel without incident. Betadine/DSD applied to 1st interspace right foot. Told to soak at home and continue betadine dressings until open wound closes.  If this worsens he is to call for follow up appointment as needed.   Return office visit  3 months   for nail care.                 Told patient to return for periodic foot care and evaluation due to potential at risk complications.   Gardiner Barefoot DPM

## 2021-10-05 NOTE — Progress Notes (Deleted)
Cardiology Office Note   Date:  10/05/2021   ID:  Christopher Davila, DOB [birth year], MRN [id number]  PCP:  [name], MD  Cardiologist:   Russel Morain [place], MD   No chief complaint on file.     History of Present Illness: Christopher Davila is a 51 y.o. male who is seen at the request of Christopher Davila for evaluation of CHF. He has a history of longstanding DM with ESRD and retinopathy. Is on HD M/W/F.  Also has a history of HTN and pulmonary HTN. Previously seen by Dr Andria Frames in [place] following an episode of CHF there in Dec/Jan. He has coronary calcification noted on CT.    He was admitted 03/2021 with Christopher Davila. He required BIPAP and up to 20L of supplemental oxygen. Treated with remdesivir and steroids. Echo unchanged. He was readmitted to the hospital November 20 through 24, 2022.  He was treated for multifocal pneumonia and pleural effusion.  Patient had ongoing SOB despite thoracentesis and antibiotics. Was seen in our office Jan 10 and stable from a cardiac standpoint.    He currently resides at [name]. Denies any chest pain or SOB. Notes weight stable on dialysis. Activity is limited. Would like to get some PT. No edema.     Past Medical History:  Diagnosis Date   Anemia    Blindness 09/04/2019   CHF (congestive heart failure) (HCC)    Diabetes mellitus    Diarrhea 10/14/2019   Epigastric abdominal pain    ESRD (end stage renal disease) ([place])    Gastroparesis due to DM ([place])    Hypertension    Hypoalbuminemia 04/17/2019   Hypoxia    Neuropathy of lower extremity    Oral candidiasis 06/26/2012   Pancreatitis 06/26/2012   Weight loss 06/26/2012    Past Surgical History:  Procedure Laterality Date   AV FISTULA PLACEMENT Right 10/29/2019   Procedure: RIGHT ARM BRACHIOBASILIC ARTERIOVENOUS (AV) FISTULA CREATION;  Surgeon: Rosetta Posner, MD;  Location: [facility] OR;  Service: Vascular;  Laterality: Right;   [employer] Right 01/05/2020   Procedure:  RIGHT ARM SECOND STAGE [place];  Surgeon: Rosetta Posner, MD;  Location: [facility] OR;  Service: Vascular;  Laterality: Right;   EYE SURGERY     IR FLUORO GUIDE CV LINE RIGHT  10/28/2019   IR US GUIDE VASC ACCESS RIGHT  10/28/2019     Current Outpatient Medications  Medication Sig Dispense Refill   albuterol (VENTOLIN HFA) 108 (90 Base) MCG/ACT inhaler Inhale 2 puffs into the lungs every 4 (four) hours as needed for shortness of breath.     amLODipine (NORVASC) 10 MG tablet Take 0.5 tablets (5 mg total) by mouth daily.     aspirin EC 81 MG tablet Take 1 tablet (81 mg total) by mouth daily.     atorvastatin (LIPITOR) 40 MG tablet Take 1 tablet (40 mg total) by mouth daily. 90 tablet 3   atropine 1 % ophthalmic solution Place 1 drop into both eyes in the morning and at bedtime.     brimonidine (ALPHAGAN P) 0.1 % SOLN Place 1 drop into the left eye in the morning and at bedtime.     calcium acetate (PHOSLO) 667 MG capsule Take 1 capsule (667 mg total) by mouth 3 (three) times daily with meals.     Dextran 70-Hypromellose (TEARS PURE) 0.1-0.3 % SOLN Place 1 drop into both eyes in the morning, at noon, in the evening, and  at bedtime.     dorzolamide-timolol (COSOPT) 22.3-6.8 MG/ML ophthalmic solution Place 1 drop into the left eye 2 (two) times daily. 10 mL 12   ferrous sulfate 325 (65 FE) MG tablet Take 325 mg by mouth daily.     guaiFENesin (ROBITUSSIN) 100 MG/5ML liquid Take 100 mg by mouth every 4 (four) hours as needed for cough.     hydrALAZINE (APRESOLINE) 25 MG tablet Take 25 mg by mouth 2 (two) times daily.     latanoprost (XALATAN) 0.005 % ophthalmic solution Place 1 drop into the left eye at bedtime. 2.5 mL 12   losartan (COZAAR) 100 MG tablet Take 1 tablet (100 mg total) by mouth daily.     Melatonin 10 MG CAPS Take 10 mg by mouth at bedtime.     olopatadine (PATANOL) 0.1 % ophthalmic solution Place 2 drops into both eyes daily.     ondansetron (ZOFRAN) 4 MG tablet Take 4 mg  by mouth every 6 (six) hours as needed for nausea or vomiting.     oxyCODONE-acetaminophen (PERCOCET/ROXICET) 5-325 MG tablet Take 1 tablet by mouth See admin instructions. 1 tablet once daily every Tuesday, Thursday, Saturday, and Sunday. May take an additional 1 tablet every 6 hours as needed on Monday's Wednesday's, and Friday's. 5 tablet 0   OXYGEN Inhale 3 L into the lungs continuous.     polyethylene glycol (MIRALAX / GLYCOLAX) 17 g packet Take 17 g by mouth daily.     prednisoLONE acetate (PRED FORTE) 1 % ophthalmic suspension Place 1 drop into both eyes 4 (four) times daily.     pregabalin (LYRICA) 25 MG capsule Take 1 capsule (25 mg total) by mouth 2 (two) times daily. 5 capsule 0   senna (SENOKOT) 8.6 MG TABS tablet Take 2 tablets by mouth at bedtime.     sertraline (ZOLOFT) 25 MG tablet Take 1 tablet (25 mg total) by mouth daily. On Monday's, Wednesday's, and Friday's, take after dialysis. All other days take in the morning. 5 tablet 0   torsemide (DEMADEX) 20 MG tablet Take 80 mg by mouth 2 (two) times daily.     vitamin B-12 1000 MCG tablet Take 1 tablet (1,000 mcg total) by mouth daily. 30 tablet 3   Vitamin D, Ergocalciferol, (DRISDOL) 1.25 MG (50000 UNIT) CAPS capsule Take 50,000 Units by mouth every 7 (seven) days. Sunday's     No current facility-administered medications for this visit.    Allergies:   Morphine and related    Social History:  The patient  reports that he quit smoking about 6 years ago. His smoking use included cigarettes. He has never used smokeless tobacco. He reports that he does not currently use alcohol. He reports that he does not use drugs.   Family History:  The patient's family history includes CAD in his cousin and maternal aunt; Diabetes in his maternal aunt and mother; Hypertension in his mother; Lung disease in his mother.    ROS:  Please see the history of present illness.   Otherwise, review of systems are positive for none.   All other systems  are reviewed and negative.    PHYSICAL EXAM: VS:  There were no vitals taken for this visit. , BMI There is no height or weight on file to calculate BMI. GEN: Well nourished, well developed, in no acute distress  HEENT: blind Neck: no JVD, carotid bruits, or masses Cardiac: RRR; no murmurs, rubs, or gallops,no edema  Respiratory:  clear to auscultation bilaterally, normal  work of breathing GI: soft, nontender, nondistended, + BS MS: no deformity or atrophy. Right arm AV fistula Skin: warm and dry, no rash Neuro:  Strength and sensation are intact Psych: euthymic mood, full affect   EKG:  EKG is ordered today. The ekg ordered today demonstrates NSR rate 87. LVH. I have personally reviewed and interpreted this study.    Recent Labs: 03/31/2021: Magnesium 2.6 05/06/2021: B Natriuretic Peptide 147.8; TSH 2.649 05/10/2021: ALT 11; BUN 14; Creatinine, Ser 2.94; Hemoglobin 9.3; Platelets 273; Potassium 4.2; Sodium 134    Lipid Panel    Component Value Date/Time   CHOL 98 (L) 04/26/2021 0849   TRIG 55 04/26/2021 0849   HDL 56 04/26/2021 0849   CHOLHDL 1.8 04/26/2021 0849   CHOLHDL 3.4 02/12/2019 2238   VLDL 36 02/12/2019 2238   LDLCALC 29 04/26/2021 0849      Wt Readings from Last 3 Encounters:  06/26/21 122 lb (55.3 kg)  05/09/21 121 lb 0.5 oz (54.9 kg)  05/03/21 127 lb 12.8 oz (58 kg)      Other studies Reviewed: Additional studies/ records that were reviewed today include:   Echo 09/24/19:SUMMARY  The left ventricular size is normal.  There is mild concentric left ventricular hypertrophy.  Left ventricular systolic function is low normal.  LV ejection fraction = 50-55%.  LV wall motion normal. Left ventricular filling pattern is  restrictive.  The right ventricle is normal in size and function.  There is moderate mitral regurgitation.  MR ERO 0.25 cm2, regurgitant volume 39 mL, regurgitant fraction 39%.  There is mild tricuspid regurgitation.  The left atrium is  moderately dilated.  Estimated right ventricular systolic pressure is 46 mmHg.  Mild pulmonary hypertension.  There is a pleural effusion present.  There is trivial pericardial effusion.  Pulmonary venous flow pattern is blunted  IVC normal size but < 50% inspiratory collapse.  Alll echo features are concerning for restrictive cardiomyopathy  There is no comparison study available.     C Echo 03/27/21: IMPRESSIONS     1. Left ventricular ejection fraction, by estimation, is 55 to 60%. The  left ventricle has normal function. The left ventricle has no regional  wall motion abnormalities. Left ventricular diastolic parameters are  consistent with Grade II diastolic  dysfunction (pseudonormalization).   2. Right ventricular systolic function is normal. The right ventricular  size is moderately enlarged. There is moderately elevated pulmonary artery  systolic pressure.   3. Left atrial size was mildly dilated.   4. Right atrial size was mildly dilated.   5. Moderate pleural effusion in the left lateral region.   6. The mitral valve is normal in structure. No evidence of mitral valve  regurgitation. No evidence of mitral stenosis.   7. The aortic valve is tricuspid. Aortic valve regurgitation is not  visualized.   8. The inferior vena cava is normal in size with <50% respiratory  variability, suggesting right atrial pressure of 8 mmHg.   Comparison(s): A prior study was performed on 02/13/2019. Prior images  reviewed side by side. RV size is larger. Pleural effusion is new.    ASSESSMENT AND PLAN:  1.  Chronic diastolic CHF. EF 50-55% in past. Volume status well controlled with dialysis. Surprisingly is still on demadex and potassium despite being on dialysis. Sodium restriction 2. Coronary calcification noted on CT. No anginal symptoms. Noted surprising given ESRD and risk factors. No further evaluation needed unless he develops anignal symptoms 3. HTN. Well controlled 4. HLD.  Goal LDL < 70. Will start lipitor 40 mg daily. Request fasting lab in 3 months 5. ESRD on HD.  6. Diabetic retinopathy with blindness.    Current medicines are reviewed at length with the patient today.  The patient does not have concerns regarding medicines.  The following changes have been made:  see above  Labs/ tests ordered today include:   No orders of the defined types were placed in this encounter.    Disposition:   FU with APP in 6 months  Signed, Nyana Haren [place], MD  10/05/2021 7:12 AM    [facility] [address] [place], [place], [address] Phone [phone], Fax [phone]

## 2021-10-09 ENCOUNTER — Ambulatory Visit: Payer: Medicare Other | Admitting: Cardiology

## 2021-10-16 ENCOUNTER — Encounter: Payer: Self-pay | Admitting: Cardiology

## 2021-10-30 ENCOUNTER — Ambulatory Visit: Payer: Medicare Other | Admitting: Podiatry

## 2021-11-05 ENCOUNTER — Other Ambulatory Visit: Payer: Self-pay

## 2021-11-05 ENCOUNTER — Emergency Department (HOSPITAL_COMMUNITY): Payer: Medicare Other

## 2021-11-05 ENCOUNTER — Observation Stay (HOSPITAL_COMMUNITY)
Admission: EM | Admit: 2021-11-05 | Discharge: 2021-11-08 | Disposition: A | Discharge status: Skilled Nursing Facility | Payer: Medicare Other | Attending: Family Medicine | Admitting: Family Medicine

## 2021-11-05 ENCOUNTER — Encounter (HOSPITAL_COMMUNITY): Payer: Self-pay

## 2021-11-05 DIAGNOSIS — R531 Weakness: Principal | ICD-10-CM | POA: Insufficient documentation

## 2021-11-05 DIAGNOSIS — D638 Anemia in other chronic diseases classified elsewhere: Secondary | ICD-10-CM | POA: Diagnosis present

## 2021-11-05 DIAGNOSIS — G319 Degenerative disease of nervous system, unspecified: Secondary | ICD-10-CM | POA: Insufficient documentation

## 2021-11-05 DIAGNOSIS — L97529 Non-pressure chronic ulcer of other part of left foot with unspecified severity: Secondary | ICD-10-CM | POA: Insufficient documentation

## 2021-11-05 DIAGNOSIS — N186 End stage renal disease: Secondary | ICD-10-CM | POA: Diagnosis not present

## 2021-11-05 DIAGNOSIS — G8929 Other chronic pain: Secondary | ICD-10-CM | POA: Insufficient documentation

## 2021-11-05 DIAGNOSIS — R112 Nausea with vomiting, unspecified: Secondary | ICD-10-CM | POA: Insufficient documentation

## 2021-11-05 DIAGNOSIS — J90 Pleural effusion, not elsewhere classified: Secondary | ICD-10-CM | POA: Diagnosis not present

## 2021-11-05 DIAGNOSIS — Z833 Family history of diabetes mellitus: Secondary | ICD-10-CM | POA: Insufficient documentation

## 2021-11-05 DIAGNOSIS — H547 Unspecified visual loss: Secondary | ICD-10-CM | POA: Diagnosis not present

## 2021-11-05 DIAGNOSIS — I959 Hypotension, unspecified: Secondary | ICD-10-CM | POA: Insufficient documentation

## 2021-11-05 DIAGNOSIS — M2550 Pain in unspecified joint: Secondary | ICD-10-CM | POA: Insufficient documentation

## 2021-11-05 DIAGNOSIS — Z794 Long term (current) use of insulin: Secondary | ICD-10-CM | POA: Insufficient documentation

## 2021-11-05 DIAGNOSIS — E1122 Type 2 diabetes mellitus with diabetic chronic kidney disease: Secondary | ICD-10-CM | POA: Insufficient documentation

## 2021-11-05 DIAGNOSIS — Z79899 Other long term (current) drug therapy: Secondary | ICD-10-CM | POA: Insufficient documentation

## 2021-11-05 DIAGNOSIS — E11621 Type 2 diabetes mellitus with foot ulcer: Secondary | ICD-10-CM | POA: Diagnosis not present

## 2021-11-05 DIAGNOSIS — E11649 Type 2 diabetes mellitus with hypoglycemia without coma: Secondary | ICD-10-CM | POA: Diagnosis not present

## 2021-11-05 DIAGNOSIS — I953 Hypotension of hemodialysis: Secondary | ICD-10-CM | POA: Insufficient documentation

## 2021-11-05 DIAGNOSIS — Z9181 History of falling: Secondary | ICD-10-CM | POA: Insufficient documentation

## 2021-11-05 DIAGNOSIS — Z7982 Long term (current) use of aspirin: Secondary | ICD-10-CM | POA: Diagnosis not present

## 2021-11-05 DIAGNOSIS — I6782 Cerebral ischemia: Secondary | ICD-10-CM | POA: Diagnosis not present

## 2021-11-05 DIAGNOSIS — Z9981 Dependence on supplemental oxygen: Secondary | ICD-10-CM | POA: Insufficient documentation

## 2021-11-05 DIAGNOSIS — Z87891 Personal history of nicotine dependence: Secondary | ICD-10-CM | POA: Diagnosis not present

## 2021-11-05 DIAGNOSIS — E785 Hyperlipidemia, unspecified: Secondary | ICD-10-CM | POA: Diagnosis not present

## 2021-11-05 DIAGNOSIS — I10 Essential (primary) hypertension: Secondary | ICD-10-CM | POA: Diagnosis not present

## 2021-11-05 DIAGNOSIS — R0989 Other specified symptoms and signs involving the circulatory and respiratory systems: Secondary | ICD-10-CM | POA: Insufficient documentation

## 2021-11-05 DIAGNOSIS — Z8616 Personal history of COVID-19: Secondary | ICD-10-CM | POA: Diagnosis not present

## 2021-11-05 DIAGNOSIS — Z992 Dependence on renal dialysis: Secondary | ICD-10-CM | POA: Diagnosis not present

## 2021-11-05 DIAGNOSIS — K3184 Gastroparesis: Secondary | ICD-10-CM | POA: Diagnosis not present

## 2021-11-05 DIAGNOSIS — Z20822 Contact with and (suspected) exposure to covid-19: Secondary | ICD-10-CM | POA: Insufficient documentation

## 2021-11-05 DIAGNOSIS — I132 Hypertensive heart and chronic kidney disease with heart failure and with stage 5 chronic kidney disease, or end stage renal disease: Secondary | ICD-10-CM | POA: Insufficient documentation

## 2021-11-05 DIAGNOSIS — L97519 Non-pressure chronic ulcer of other part of right foot with unspecified severity: Secondary | ICD-10-CM | POA: Insufficient documentation

## 2021-11-05 DIAGNOSIS — Z8249 Family history of ischemic heart disease and other diseases of the circulatory system: Secondary | ICD-10-CM | POA: Insufficient documentation

## 2021-11-05 DIAGNOSIS — E1143 Type 2 diabetes mellitus with diabetic autonomic (poly)neuropathy: Secondary | ICD-10-CM | POA: Insufficient documentation

## 2021-11-05 DIAGNOSIS — I5032 Chronic diastolic (congestive) heart failure: Secondary | ICD-10-CM | POA: Insufficient documentation

## 2021-11-05 DIAGNOSIS — R2681 Unsteadiness on feet: Secondary | ICD-10-CM | POA: Insufficient documentation

## 2021-11-05 DIAGNOSIS — I5033 Acute on chronic diastolic (congestive) heart failure: Secondary | ICD-10-CM | POA: Insufficient documentation

## 2021-11-05 DIAGNOSIS — I5030 Unspecified diastolic (congestive) heart failure: Secondary | ICD-10-CM | POA: Diagnosis present

## 2021-11-05 DIAGNOSIS — D631 Anemia in chronic kidney disease: Secondary | ICD-10-CM | POA: Insufficient documentation

## 2021-11-05 HISTORY — DX: Encounter for palliative care: Z51.5

## 2021-11-05 HISTORY — DX: COVID-19: U07.1

## 2021-11-05 HISTORY — DX: Fluid overload, unspecified: E87.70

## 2021-11-05 LAB — CBC
HCT: 33.1 % — ABNORMAL LOW (ref 39.0–52.0)
HCT: 37.6 % — ABNORMAL LOW (ref 39.0–52.0)
Hemoglobin: 10.7 g/dL — ABNORMAL LOW (ref 13.0–17.0)
Hemoglobin: 11.8 g/dL — ABNORMAL LOW (ref 13.0–17.0)
MCH: 28.9 pg (ref 26.0–34.0)
MCH: 28 pg (ref 26.0–34.0)
MCHC: 32.3 g/dL (ref 30.0–36.0)
MCHC: 31.4 g/dL (ref 30.0–36.0)
MCV: 89.5 fL (ref 80.0–100.0)
MCV: 89.3 fL (ref 80.0–100.0)
Platelets: 204 10*3/uL (ref 150–400)
Platelets: 206 10*3/uL (ref 150–400)
RBC: 3.7 MIL/uL — ABNORMAL LOW (ref 4.22–5.81)
RBC: 4.21 MIL/uL — ABNORMAL LOW (ref 4.22–5.81)
RDW: 17.7 % — ABNORMAL HIGH (ref 11.5–15.5)
RDW: 17.5 % — ABNORMAL HIGH (ref 11.5–15.5)
WBC: 8.5 10*3/uL (ref 4.0–10.5)
WBC: 8.4 10*3/uL (ref 4.0–10.5)
nRBC: 0 % (ref 0.0–0.2)
nRBC: 0 % (ref 0.0–0.2)

## 2021-11-05 LAB — LIPASE, BLOOD: Lipase: 32 U/L (ref 11–51)

## 2021-11-05 LAB — COMPREHENSIVE METABOLIC PANEL
ALT: 18 U/L (ref 0–44)
AST: 20 U/L (ref 15–41)
Albumin: 3.8 g/dL (ref 3.5–5.0)
Alkaline Phosphatase: 92 U/L (ref 38–126)
Anion gap: 14 (ref 5–15)
BUN: 33 mg/dL — ABNORMAL HIGH (ref 6–20)
CO2: 27 mmol/L (ref 22–32)
Calcium: 8.9 mg/dL (ref 8.9–10.3)
Chloride: 93 mmol/L — ABNORMAL LOW (ref 98–111)
Creatinine, Ser: 5.12 mg/dL — ABNORMAL HIGH (ref 0.61–1.24)
GFR, Estimated: 13 mL/min — ABNORMAL LOW (ref >60)
Glucose, Bld: 111 mg/dL — ABNORMAL HIGH (ref 70–99)
Potassium: 4.1 mmol/L (ref 3.5–5.1)
Sodium: 134 mmol/L — ABNORMAL LOW (ref 135–145)
Total Bilirubin: 0.7 mg/dL (ref 0.3–1.2)
Total Protein: 8.2 g/dL — ABNORMAL HIGH (ref 6.5–8.1)

## 2021-11-05 LAB — BRAIN NATRIURETIC PEPTIDE: B Natriuretic Peptide: 257.1 pg/mL — ABNORMAL HIGH (ref 0.0–100.0)

## 2021-11-05 LAB — RESP PANEL BY RT-PCR (FLU A&B, COVID) ARPGX2
Influenza A by PCR: NEGATIVE
Influenza B by PCR: NEGATIVE
SARS Coronavirus 2 by RT PCR: NEGATIVE

## 2021-11-05 LAB — TROPONIN I (HIGH SENSITIVITY)
Troponin I (High Sensitivity): 18 ng/L — ABNORMAL HIGH (ref <18)
Troponin I (High Sensitivity): 19 ng/L — ABNORMAL HIGH (ref <18)

## 2021-11-05 LAB — MAGNESIUM: Magnesium: 2.6 mg/dL — ABNORMAL HIGH (ref 1.7–2.4)

## 2021-11-05 LAB — GLUCOSE, CAPILLARY: Glucose-Capillary: 88 mg/dL (ref 70–99)

## 2021-11-05 MED ORDER — VITAMIN B-12 1000 MCG PO TABS
1000.0000 ug | ORAL_TABLET | Freq: Every day | ORAL | Status: DC
  Administered 2021-11-06 – 2021-11-07 (×2): 1000 ug via ORAL
  Filled 2021-11-05 (×2): qty 1

## 2021-11-05 MED ORDER — FERROUS SULFATE 325 (65 FE) MG PO TABS
325.0000 mg | ORAL_TABLET | Freq: Every day | ORAL | Status: DC
  Administered 2021-11-06 – 2021-11-07 (×2): 325 mg via ORAL
  Filled 2021-11-05 (×2): qty 1

## 2021-11-05 MED ORDER — HYDRALAZINE HCL 25 MG PO TABS
25.0000 mg | ORAL_TABLET | Freq: Two times a day (BID) | ORAL | Status: DC
  Administered 2021-11-05 – 2021-11-07 (×5): 25 mg via ORAL
  Filled 2021-11-05 (×5): qty 1

## 2021-11-05 MED ORDER — PREDNISOLONE ACETATE 1 % OP SUSP
1.0000 [drp] | Freq: Three times a day (TID) | OPHTHALMIC | Status: DC
  Administered 2021-11-06 – 2021-11-07 (×8): 1 [drp] via OPHTHALMIC
  Filled 2021-11-05: qty 5

## 2021-11-05 MED ORDER — OLOPATADINE HCL 0.1 % OP SOLN
2.0000 [drp] | Freq: Every day | OPHTHALMIC | Status: DC
  Administered 2021-11-06 – 2021-11-07 (×2): 2 [drp] via OPHTHALMIC
  Filled 2021-11-05: qty 5

## 2021-11-05 MED ORDER — INSULIN ASPART 100 UNIT/ML IJ SOLN
0.0000 [IU] | Freq: Three times a day (TID) | INTRAMUSCULAR | Status: DC
  Administered 2021-11-06: 1 [IU] via SUBCUTANEOUS

## 2021-11-05 MED ORDER — ATROPINE SULFATE 1 % OP SOLN
1.0000 [drp] | Freq: Two times a day (BID) | OPHTHALMIC | Status: DC
  Administered 2021-11-06 (×2): 1 [drp] via OPHTHALMIC
  Filled 2021-11-05: qty 2

## 2021-11-05 MED ORDER — HEPARIN SODIUM (PORCINE) 5000 UNIT/ML IJ SOLN
5000.0000 [IU] | Freq: Three times a day (TID) | INTRAMUSCULAR | Status: DC
  Administered 2021-11-05 – 2021-11-07 (×7): 5000 [IU] via SUBCUTANEOUS
  Filled 2021-11-05 (×7): qty 1

## 2021-11-05 MED ORDER — ACETAMINOPHEN 325 MG PO TABS: 650.0000 mg | ORAL_TABLET | Freq: Four times a day (QID) | ORAL | Status: DC | PRN

## 2021-11-05 MED ORDER — ACETAMINOPHEN 650 MG RE SUPP: 650.0000 mg | Freq: Four times a day (QID) | RECTAL | Status: DC | PRN

## 2021-11-05 MED ORDER — AMLODIPINE BESYLATE 5 MG PO TABS
5.0000 mg | ORAL_TABLET | Freq: Every day | ORAL | Status: DC
  Administered 2021-11-06 – 2021-11-07 (×2): 5 mg via ORAL
  Filled 2021-11-05 (×2): qty 1

## 2021-11-05 MED ORDER — CALCIUM ACETATE (PHOS BINDER) 667 MG PO CAPS
667.0000 mg | ORAL_CAPSULE | Freq: Three times a day (TID) | ORAL | Status: DC
  Administered 2021-11-06 – 2021-11-07 (×5): 667 mg via ORAL
  Filled 2021-11-05 (×5): qty 1

## 2021-11-05 MED ORDER — OXYCODONE-ACETAMINOPHEN 5-325 MG PO TABS
1.0000 | ORAL_TABLET | Freq: Four times a day (QID) | ORAL | Status: DC | PRN
  Administered 2021-11-05 – 2021-11-07 (×7): 1 via ORAL
  Filled 2021-11-05 (×7): qty 1

## 2021-11-05 MED ORDER — POLYVINYL ALCOHOL 1.4 % OP SOLN
1.0000 [drp] | Freq: Three times a day (TID) | OPHTHALMIC | Status: DC
  Administered 2021-11-06 – 2021-11-07 (×7): 1 [drp] via OPHTHALMIC
  Filled 2021-11-05: qty 15

## 2021-11-05 MED ORDER — POLYETHYLENE GLYCOL 3350 17 G PO PACK: 17.0000 g | PACK | Freq: Every day | ORAL | Status: DC | PRN

## 2021-11-05 MED ORDER — LATANOPROST 0.005 % OP SOLN
1.0000 [drp] | Freq: Every day | OPHTHALMIC | Status: DC
  Administered 2021-11-05 – 2021-11-07 (×3): 1 [drp] via OPHTHALMIC
  Filled 2021-11-05: qty 2.5

## 2021-11-05 MED ORDER — DORZOLAMIDE HCL-TIMOLOL MAL 2-0.5 % OP SOLN
1.0000 [drp] | Freq: Two times a day (BID) | OPHTHALMIC | Status: DC
  Administered 2021-11-05 – 2021-11-07 (×5): 1 [drp] via OPHTHALMIC
  Filled 2021-11-05: qty 10

## 2021-11-05 MED ORDER — ATORVASTATIN CALCIUM 40 MG PO TABS
40.0000 mg | ORAL_TABLET | Freq: Every day | ORAL | Status: DC
  Administered 2021-11-06 – 2021-11-07 (×2): 40 mg via ORAL
  Filled 2021-11-05 (×2): qty 1

## 2021-11-05 MED ORDER — SODIUM CHLORIDE 0.9% FLUSH
3.0000 mL | Freq: Two times a day (BID) | INTRAVENOUS | Status: DC
  Administered 2021-11-06 – 2021-11-07 (×3): 3 mL via INTRAVENOUS

## 2021-11-05 NOTE — ED Notes (Signed)
Attempted to ambulate pt. Pt unstable while standing. Pt states he is unable to take a step after standing. Pt back in bed resting.

## 2021-11-05 NOTE — H&P (Signed)
History and Physical   Christopher Davila GBT:[id number] DOB: [birth year] DOA: 11/05/2021  PCP: [name], MD   Patient coming from: Dialysis center  Chief Complaint: Low blood pressure, weakness  HPI: Christopher Davila is a 51 y.o. male with medical history significant of diabetes, gastroparesis, hypertension, hyperlipidemia, anemia, ESRD on HD, CHF, blindness presenting from dialysis center with hypotension initially now with weakness.  Patient was at his dialysis center today and was able to finish his dialysis session however he became hypotensive while there and received a fluid bolus.  EMS was called and patient was hypertensive on their arrival and was transported to the ED.  He reports 2 days of nausea vomiting with some weakness.  He did fall and hit his head on a trash can at his facility, [place], but declined evaluation for that at the time.  He typically walks with a walker but the last time he did that was several days ago.  He slept most of the day on Saturday and Sunday.  Unable to ambulate with assistance today.  He denies fevers, chills, chest pain, shortness of breath, abdominal pain, constipation, diarrhea.  ED Course: Vital signs in the ED significant for blood pressure in the 710G to 269S systolic.  Lab work-up included CMP with sodium 134, chloride 93, BUN 33, creatinine 5.12, glucose 111, protein 8.2.  CBC with hemoglobin stable at 10.7.  Troponin flat at 18 and then 19 on repeat.  Lipase normal.  Respiratory panel flu COVID-negative.  Urinalysis pending.  Imaging included chest x-ray with cardiomegaly, pulmonary vascular congestion, interstitial edema, bilateral effusions.  CT head showing no acute abnormality.  No additional interventions in the ED.  There was no attempt to get patient to ambulate to see if he was safe for discharge home however he was unable to do this.  Review of Systems: As per HPI otherwise all other systems reviewed and are negative.  Past Medical  History:  Diagnosis Date   Acute on chronic diastolic CHF (congestive heart failure) ([place]) 09/27/2019   Acute respiratory failure with hypoxia ([place]) 03/26/2021   Acute urinary retention 10/13/2019   Anemia    Blindness 09/04/2019   CHF (congestive heart failure) ([place])    COVID-19    Diabetes mellitus    Diarrhea 10/14/2019   Epigastric abdominal pain    ESRD (end stage renal disease) ([place])    Gastroparesis due to DM ([place])    Hypertension    Hypertensive urgency 04/17/2019   Hypervolemia    Hypoalbuminemia 04/17/2019   Hypoxia    Metabolic acidosis, increased anion gap 10/13/2019   Multifocal pneumonia 05/06/2021   Neuropathy of lower extremity    Oral candidiasis 06/26/2012   Palliative care encounter    Pancreatitis 06/26/2012   Weight loss 06/26/2012    Past Surgical History:  Procedure Laterality Date   AV FISTULA PLACEMENT Right 10/29/2019   Procedure: RIGHT ARM BRACHIOBASILIC ARTERIOVENOUS (AV) FISTULA CREATION;  Surgeon: Rosetta Posner, MD;  Location: [facility] OR;  Service: Vascular;  Laterality: Right;   [place] Right 01/05/2020   Procedure: RIGHT ARM SECOND STAGE [place];  Surgeon: Rosetta Posner, MD;  Location: [facility] OR;  Service: Vascular;  Laterality: Right;   EYE SURGERY     IR FLUORO GUIDE CV LINE RIGHT  10/28/2019   IR US GUIDE VASC ACCESS RIGHT  10/28/2019    Social History  reports that he quit smoking about 6 years ago. His smoking use included cigarettes. He  has never used smokeless tobacco. He reports that he does not currently use alcohol. He reports that he does not use drugs.  Allergies  Allergen Reactions   Morphine And Related Itching and Other (See Comments)    Pt prefers not to be given this drug    Family History  Problem Relation Age of Onset   Diabetes Mother    Lung disease Mother    Hypertension Mother    Diabetes Maternal Aunt    CAD Maternal Aunt    CAD Cousin   Reviewed on admission  Prior to Admission  medications   Medication Sig Start Date End Date Taking? Authorizing Provider  albuterol (VENTOLIN HFA) 108 (90 Base) MCG/ACT inhaler Inhale 2 puffs into the lungs every 4 (four) hours as needed for shortness of breath.    [provider]  amLODipine (NORVASC) 10 MG tablet Take 0.5 tablets (5 mg total) by mouth daily. 05/10/21   Nita Sells, MD  aspirin EC 81 MG tablet Take 1 tablet (81 mg total) by mouth daily. 05/24/15   Ghimire, Henreitta Leber, MD  atorvastatin (LIPITOR) 40 MG tablet Take 1 tablet (40 mg total) by mouth daily. 12/05/20   [place], Peter M, MD  atropine 1 % ophthalmic solution Place 1 drop into both eyes in the morning and at bedtime. 04/24/21   [provider]  brimonidine (ALPHAGAN P) 0.1 % SOLN Place 1 drop into the left eye in the morning and at bedtime.    [provider]  calcium acetate (PHOSLO) 667 MG capsule Take 1 capsule (667 mg total) by mouth 3 (three) times daily with meals. 11/19/19   Charlynne Cousins, MD  Dextran 70-Hypromellose (TEARS PURE) 0.1-0.3 % SOLN Place 1 drop into both eyes in the morning, at noon, in the evening, and at bedtime.    [provider]  dorzolamide-timolol (COSOPT) 22.3-6.8 MG/ML ophthalmic solution Place 1 drop into the left eye 2 (two) times daily. 09/23/19   Geradine Girt, DO  ferrous sulfate 325 (65 FE) MG tablet Take 325 mg by mouth daily. 08/25/20   [provider]  guaiFENesin (ROBITUSSIN) 100 MG/5ML liquid Take 100 mg by mouth every 4 (four) hours as needed for cough.    [provider]  hydrALAZINE (APRESOLINE) 25 MG tablet Take 25 mg by mouth 2 (two) times daily.    [provider]  latanoprost (XALATAN) 0.005 % ophthalmic solution Place 1 drop into the left eye at bedtime. 09/23/19   Geradine Girt, DO  losartan (COZAAR) 100 MG tablet Take 1 tablet (100 mg total) by mouth daily. 11/19/19   Charlynne Cousins, MD  Melatonin 10 MG CAPS Take 10 mg by mouth at bedtime.     [provider]  olopatadine (PATANOL) 0.1 % ophthalmic solution Place 2 drops into both eyes daily.    [provider]  ondansetron (ZOFRAN) 4 MG tablet Take 4 mg by mouth every 6 (six) hours as needed for nausea or vomiting.    [provider]  oxyCODONE-acetaminophen (PERCOCET/ROXICET) 5-325 MG tablet Take 1 tablet by mouth See admin instructions. 1 tablet once daily every Tuesday, Thursday, Saturday, and Sunday. May take an additional 1 tablet every 6 hours as needed on Monday's Wednesday's, and Friday's. 05/10/21   Nita Sells, MD  OXYGEN Inhale 3 L into the lungs continuous.    [provider]  polyethylene glycol (MIRALAX / GLYCOLAX) 17 g packet Take 17 g by mouth daily.    [provider]  prednisoLONE acetate (PRED FORTE) 1 % ophthalmic suspension Place 1 drop into both eyes 4 (four) times daily.    [provider]  pregabalin (LYRICA) 25 MG capsule Take 1 capsule (25 mg total) by mouth 2 (two) times daily. 05/10/21   Nita Sells, MD  senna (SENOKOT) 8.6 MG TABS tablet Take 2 tablets by mouth at bedtime.    [provider]  sertraline (ZOLOFT) 25 MG tablet Take 1 tablet (25 mg total) by mouth daily. On Monday's, Wednesday's, and Friday's, take after dialysis. All other days take in the morning. 05/10/21   Nita Sells, MD  torsemide (DEMADEX) 20 MG tablet Take 80 mg by mouth 2 (two) times daily.    [provider]  vitamin B-12 1000 MCG tablet Take 1 tablet (1,000 mcg total) by mouth daily. 05/11/21   Nita Sells, MD  Vitamin D, Ergocalciferol, (DRISDOL) 1.25 MG (50000 UNIT) CAPS capsule Take 50,000 Units by mouth every 7 (seven) days. Sunday's    [provider]    Physical Exam: Vitals:   11/05/21 1900 11/05/21 1930 11/05/21 2000 11/05/21 2030  BP: (!) 185/104 (!) 164/95 (!) 164/103 (!) 183/99  Pulse: 80 77 79 77  Resp: 18 19 18 14   Temp:      TempSrc:      SpO2: 100%  100% 100% 100%    Physical Exam Constitutional:      General: He is not in acute distress.    Appearance: Normal appearance.  HENT:     Head: Normocephalic and atraumatic.     Mouth/Throat:     Mouth: Mucous membranes are moist.     Pharynx: Oropharynx is clear.  Eyes:     Extraocular Movements: Extraocular movements intact.     Pupils: Pupils are equal, round, and reactive to light.  Cardiovascular:     Rate and Rhythm: Normal rate and regular rhythm.     Pulses: Normal pulses.     Heart sounds: Normal heart sounds.  Pulmonary:     Effort: Pulmonary effort is normal. No respiratory distress.     Breath sounds: Rales (Trace) present.  Abdominal:     General: Bowel sounds are normal. There is no distension.     Palpations: Abdomen is soft.     Tenderness: There is no abdominal tenderness.  Musculoskeletal:        General: No swelling or deformity.  Skin:    General: Skin is warm and dry.  Neurological:     General: No focal deficit present.     Mental Status: Mental status is at baseline.   Labs on Admission: I have personally reviewed following labs and imaging studies  CBC: Recent Labs  Lab 11/05/21 1725  WBC 8.5  HGB 10.7*  HCT 33.1*  MCV 89.5  PLT 263    Basic Metabolic Panel: Recent Labs  Lab 11/05/21 1633  NA 134*  K 4.1  CL 93*  CO2 27  GLUCOSE 111*  BUN 33*  CREATININE 5.12*  CALCIUM 8.9    GFR: CrCl cannot be calculated (Unknown ideal weight.).  Liver Function Tests: Recent Labs  Lab 11/05/21 1633  AST 20  ALT 18  ALKPHOS 92  BILITOT 0.7  PROT 8.2*  ALBUMIN 3.8    Urine analysis:    Component Value Date/Time   COLORURINE YELLOW 05/06/2021 2000   APPEARANCEUR CLEAR 05/06/2021 2000   LABSPEC 1.014 05/06/2021 2000   PHURINE 8.0 05/06/2021 [place] 05/06/2021 2000  HGBUR NEGATIVE 05/06/2021 2000   BILIRUBINUR NEGATIVE 05/06/2021 2000   KETONESUR NEGATIVE 05/06/2021 2000   PROTEINUR 100 (A) 05/06/2021 2000    UROBILINOGEN 0.2 12/13/2014 2056   NITRITE NEGATIVE 05/06/2021 2000   LEUKOCYTESUR NEGATIVE 05/06/2021 2000    Radiological Exams on Admission: DG Chest 2 View  Result Date: 11/05/2021 CLINICAL DATA:  Weakness. Additional history provided: Weakness and joint pain, nausea/vomiting. Dialysis patient. EXAM: CHEST - 2 VIEW COMPARISON:  Prior chest radiographs 05/06/2021 and earlier. FINDINGS: Cardiomegaly. Central pulmonary vascular congestion with interstitial edema. Bilateral pleural effusions (small to moderate right, small left). No evidence of the thorax. No acute bony abnormality identified. Surgical clips within the right axilla. IMPRESSION: Cardiomegaly. Central pulmonary vascular congestion with interstitial edema. Bilateral pleural effusions (small to moderate right, small left). Electronically Signed   By: Kellie Simmering D.O.   On: 11/05/2021 18:37   CT Head Wo Contrast  Result Date: 11/05/2021 CLINICAL DATA:  Weakness and hypertension. EXAM: CT HEAD WITHOUT CONTRAST TECHNIQUE: Contiguous axial images were obtained from the base of the skull through the vertex without intravenous contrast. RADIATION DOSE REDUCTION: This exam was performed according to the departmental dose-optimization program which includes automated exposure control, adjustment of the mA and/or kV according to patient size and/or use of iterative reconstruction technique. COMPARISON:  MRI brain dated May 07, 2021. CT head dated August 02, 2020. FINDINGS: Brain: No evidence of acute infarction, hemorrhage, hydrocephalus, extra-axial collection or mass lesion/mass effect. Stable age advanced mild cerebral atrophy and moderate chronic microvascular ischemic changes. Vascular: Atherosclerotic vascular calcification of the carotid siphons. No hyperdense vessel. Skull: Normal. Negative for fracture or focal lesion. Sinuses/Orbits: No acute finding. Chronic bilateral vitreous hemorrhage and progressive right phthisis bulbi. Other:  None. IMPRESSION: 1. No acute intracranial abnormality. 2. Stable age advanced mild cerebral atrophy and moderate chronic microvascular ischemic changes. Electronically Signed   By: Titus Dubin M.D.   On: 11/05/2021 18:00    EKG: Independently reviewed.  Sinus rhythm at 82 bpm.  Some baseline artifact and baseline wander.  Nonspecific ST changes unclear due to artifact.  Assessment/Plan Principal Problem:   Generalized weakness Active Problems:   Type 2 diabetes mellitus with hypoglycemia (HCC)   Essential hypertension   Hyperlipidemia   Gastroparesis due to DM ([place])   Anemia of chronic disease   ESRD on dialysis ([place])   Heart failure with preserved ejection fraction (HCC)   End-stage renal disease on hemodialysis (HCC)   Generalized weakness > Patient initially presented due to episode of hypotension with dialysis but after receiving bolus in being off of dialysis noted to be hypertensive since. > Also reporting generalized weakness typically walks with a walker but he last did this on Friday and did minimal walking and primarily stayed in his bed on Saturday and Sunday.  Now weaker and was unable to walk even with assistance in the ED. > Does report some nausea and vomiting for the past couple days as well. > This acute on chronic weakness is most likely progressive deconditioning in the setting of chronic weakness exacerbated by illness and inactivity. > Chest x-ray does show possibility of fluid overload, despite finishing his dialysis session today he may be volume up due to less aggressive volume removal and fluid bolus during session due to reported hypotension. > No evidence of current bacterial infection.  We will also check TSH and B12 for completeness. - Monitor on telemetry - PT and OT eval and treat - Supportive care - Check BNP  to evaluate for CHF exacerbation - Check TSH and B12  CHF > Known history of CHF.  Last echo 7 or 8 months ago with EF 55-60%, grade 2  diastolic dysfunction, normal RV function. > Not complaining of significant edema or shortness of breath, completed HD today, but received a bolus for reported hypotension there.  Has been hypertensive for EMS and in the ED. > Chest x-ray showing evidence of volume overload.  Mild rales on exam, no edema. - Monitor on telemetry - Check BNP - Depending on BNP results will determine to continue with home p.o. torsemide or dose of IV diuretics - Strict I's and O's, daily weights - Consider echocardiogram pending BNP results  Diabetes - SSI  Hypertension - Continue home amlodipine, Losartan, and hydralazine - Hold off on torsemide for now awaiting BNP  Hyperlipidemia - Continue home atorvastatin  Anemia > History of normocytic/anemia of chronic disease. > Hemoglobin stable at 10.7 in the ED. - Trend CBC  ESRD on HD > MWF, Completed session today > No need for urgent HD, consider nephrology consult if patient is here through Wednesday or has derangements warranting consultation. - Trend renal function and electrolytes - Continue home PhosLo  Chronic pain - Continue home as needed pain medication  Blindness - Continue home eyedrops, ensure confirmation by pharmacy  DVT prophylaxis: Heparin Code Status:   Full Patient states there is no one he would like to be contacted.  Family Communication:  None on admission.  Disposition Plan:   Patient is from:  Dialysis center/[facility] [place]  Anticipated DC to:  [place]  Anticipated DC date:  1 to 3 days  Anticipated DC barriers: None  Consults called:  None Admission status:  Observation, telemetry  Severity of Illness: The appropriate patient status for this patient is OBSERVATION. Observation status is judged to be reasonable and necessary in order to provide the required intensity of service to ensure the patient's safety. The patient's presenting symptoms, physical exam findings, and initial radiographic and laboratory data in  the context of their medical condition is felt to place them at decreased risk for further clinical deterioration. Furthermore, it is anticipated that the patient will be medically stable for discharge from the hospital within 2 midnights of admission.    Marcelyn Bruins MD [facility]  How to contact the [facility] Attending or Consulting provider [place] or covering provider during after hours [place], for this patient?   Check the care team in [facility] and look for a) attending/consulting [facility] provider listed and b) the [facility] team listed Log into [email or web address] and use [place]'s universal password to access. If you do not have the password, please contact the hospital operator. Locate the [facility] provider you are looking for under [facility] and page to a number that you can be directly reached. If you still have difficulty reaching the provider, please page the [facility] (Director on Call) for the Hospitalists listed on amion for assistance.  11/05/2021, 8:44 PM

## 2021-11-05 NOTE — ED Notes (Signed)
ED TO INPATIENT HANDOFF REPORT  ED Nurse Name and Phone #: Mel Almond [phone]  S Name/Age/Gender Christopher Davila 51 y.o. male Room/Bed: 675F/163W  Code Status   Code Status: Full Code  Home/SNF/Other Home Patient oriented to: self, place, time, and situation Is this baseline? Yes   Triage Complete: Triage complete  Chief Complaint Generalized weakness [R53.1]  Triage Note BIB [facility] from dialysis clinic. NV last night many times. Hypotensive with dialysis- given fluid bolus. Hypertensive with EMS. Received full treatment of dialysis today. Right restricted. Complains of weakness, joint pain. Pt is blind.   Allergies Allergies  Allergen Reactions   Morphine And Related Itching and Other (See Comments)    Pt prefers not to be given this drug    Level of Care/Admitting Diagnosis ED Disposition     ED Disposition  Admit   Condition  --   [place]: [place] [[id number]]  Level of Care: Telemetry Medical [104]  May place patient in observation at [facility] or [facility] if equivalent level of care is available:: No  Covid Evaluation: Asymptomatic - no recent exposure (last 10 days) testing not required  Diagnosis: Generalized weakness [466599]  Admitting Physician: Marcelyn Bruins [[id number]]  Attending Physician: Marcelyn Bruins [[id number]]          B Medical/Surgery History Past Medical History:  Diagnosis Date   Acute on chronic diastolic CHF (congestive heart failure) ([place]) 09/27/2019   Acute respiratory failure with hypoxia ([place]) 03/26/2021   Acute urinary retention 10/13/2019   Anemia    Blindness 09/04/2019   CHF (congestive heart failure) ([place])    COVID-19    Diabetes mellitus    Diarrhea 10/14/2019   Epigastric abdominal pain    ESRD (end stage renal disease) ([place])    Gastroparesis due to DM ([place])    Hypertension    Hypertensive urgency 04/17/2019   Hypervolemia    Hypoalbuminemia 04/17/2019   Hypoxia     Metabolic acidosis, increased anion gap 10/13/2019   Multifocal pneumonia 05/06/2021   Neuropathy of lower extremity    Oral candidiasis 06/26/2012   Palliative care encounter    Pancreatitis 06/26/2012   Weight loss 06/26/2012   Past Surgical History:  Procedure Laterality Date   AV FISTULA PLACEMENT Right 10/29/2019   Procedure: RIGHT ARM BRACHIOBASILIC ARTERIOVENOUS (AV) FISTULA CREATION;  Surgeon: Rosetta Posner, MD;  Location: [facility];  Service: Vascular;  Laterality: Right;   [place] Right 01/05/2020   Procedure: RIGHT ARM SECOND STAGE [place];  Surgeon: Rosetta Posner, MD;  Location: [facility];  Service: Vascular;  Laterality: Right;   EYE SURGERY     IR FLUORO GUIDE CV LINE RIGHT  10/28/2019   IR US GUIDE VASC ACCESS RIGHT  10/28/2019     A IV Location/Drains/Wounds Patient Lines/Drains/Airways Status     Active Line/Drains/Airways     Name Placement date Placement time Site Days   Peripheral IV 11/05/21 20 G Left Antecubital 11/05/21  1725  Antecubital  less than 1   Fistula / Graft Right Upper arm Arteriovenous fistula 10/29/19  1319  Upper arm  738   Urethral Catheter Gwendolyn Fill, RN Latex 16 Fr. 05/06/21  2007  Latex  183   Incision (Closed) 01/05/20 Arm Right 01/05/20  1155  -- 670            Intake/Output Last 24 hours No intake or output data in the 24 hours ending 11/05/21 2105  Labs/Imaging Results for orders placed or performed during the hospital encounter of 11/05/21 (from the past 48 hour(s))  Comprehensive metabolic panel     Status: Abnormal   Collection Time: 11/05/21  4:33 PM  Result Value Ref Range   Sodium 134 (L) 135 - 145 mmol/L   Potassium 4.1 3.5 - 5.1 mmol/L   Chloride 93 (L) 98 - 111 mmol/L   CO2 27 22 - 32 mmol/L   Glucose, Bld 111 (H) 70 - 99 mg/dL    Comment: Glucose reference range applies only to samples taken after fasting for at least 8 hours.   BUN 33 (H) 6 - 20 mg/dL   Creatinine, Ser 5.12 (H)  0.61 - 1.24 mg/dL   Calcium 8.9 8.9 - 10.3 mg/dL   Total Protein 8.2 (H) 6.5 - 8.1 g/dL   Albumin 3.8 3.5 - 5.0 g/dL   AST 20 15 - 41 U/L   ALT 18 0 - 44 U/L   Alkaline Phosphatase 92 38 - 126 U/L   Total Bilirubin 0.7 0.3 - 1.2 mg/dL   GFR, Estimated 13 (L) >60 mL/min    Comment: (NOTE) Calculated using the CKD-EPI Creatinine Equation (2021)    Anion gap 14 5 - 15    Comment: Performed at [facility], [place] [address], [place], [place] [address]  Troponin I (High Sensitivity)     Status: Abnormal   Collection Time: 11/05/21  5:25 PM  Result Value Ref Range   Troponin I (High Sensitivity) 18 (H) <18 ng/L    Comment: (NOTE) Elevated high sensitivity troponin I (hsTnI) values and significant  changes across serial measurements may suggest ACS but many other  chronic and acute conditions are known to elevate hsTnI results.  Refer to the "Links" section for chest pain algorithms and additional  guidance. Performed at [facility], [place] [address] [place], [place] [address]   Lipase, blood     Status: None   Collection Time: 11/05/21  5:25 PM  Result Value Ref Range   Lipase 32 11 - 51 U/L    Comment: Performed at [facility] [address] [place], [place] [address]  CBC     Status: Abnormal   Collection Time: 11/05/21  5:25 PM  Result Value Ref Range   WBC 8.5 4.0 - 10.5 K/uL   RBC 3.70 (L) 4.22 - 5.81 MIL/uL   Hemoglobin 10.7 (L) 13.0 - 17.0 g/dL   HCT 33.1 (L) 39.0 - 52.0 %   MCV 89.5 80.0 - 100.0 fL   MCH 28.9 26.0 - 34.0 pg   MCHC 32.3 30.0 - 36.0 g/dL   RDW 17.7 (H) 11.5 - 15.5 %   Platelets 204 150 - 400 K/uL   nRBC 0.0 0.0 - 0.2 %    Comment: Performed at [facility] [address], [place], [place] [address]  Resp Panel by RT-PCR (Flu A&B, Covid) Nasopharyngeal Swab     Status: None   Collection Time: 11/05/21  6:31 PM   Specimen: Nasopharyngeal Swab; Nasopharyngeal(NP) swabs in vial transport medium  Result Value Ref Range   SARS  Coronavirus 2 by RT PCR NEGATIVE NEGATIVE    Comment: (NOTE) SARS-CoV-2 target nucleic acids are NOT DETECTED.  The SARS-CoV-2 RNA is generally detectable in upper respiratory specimens during the acute phase of infection. The lowest concentration of SARS-CoV-2 viral copies this assay can detect is 138 copies/mL. A negative result does not preclude SARS-Cov-2 infection and  should not be used as the sole basis for treatment or other patient management decisions. A negative result may occur with  improper specimen collection/handling, submission of specimen other than nasopharyngeal swab, presence of viral mutation(s) within the areas targeted by this assay, and inadequate number of viral copies(<138 copies/mL). A negative result must be combined with clinical observations, patient history, and epidemiological information. The expected result is Negative.  Fact Sheet for Patients:  EntrepreneurPulse.com.au  Fact Sheet for Healthcare Providers:  IncredibleEmployment.be  This test is no t yet approved or cleared by the [place] FDA and  has been authorized for detection and/or diagnosis of SARS-CoV-2 by FDA under an Emergency Use Authorization (EUA). This EUA will remain  in effect (meaning this test can be used) for the duration of the COVID-19 declaration under Section 564(b)(1) of the Act, 21 U.S.C.section 360bbb-3(b)(1), unless the authorization is terminated  or revoked sooner.       Influenza A by PCR NEGATIVE NEGATIVE   Influenza B by PCR NEGATIVE NEGATIVE    Comment: (NOTE) The Xpert Xpress SARS-CoV-2/FLU/RSV plus assay is intended as an aid in the diagnosis of influenza from Nasopharyngeal swab specimens and should not be used as a sole basis for treatment. Nasal washings and aspirates are unacceptable for Xpert Xpress SARS-CoV-2/FLU/RSV testing.  Fact Sheet for Patients: EntrepreneurPulse.com.au  Fact Sheet  for Healthcare Providers: IncredibleEmployment.be  This test is not yet approved or cleared by the [place] FDA and has been authorized for detection and/or diagnosis of SARS-CoV-2 by FDA under an Emergency Use Authorization (EUA). This EUA will remain in effect (meaning this test can be used) for the duration of the COVID-19 declaration under Section 564(b)(1) of the Act, 21 U.S.C. section 360bbb-3(b)(1), unless the authorization is terminated or revoked.  Performed at [facility], [place] [address], [place], [place] [address]   Troponin I (High Sensitivity)     Status: Abnormal   Collection Time: 11/05/21  6:57 PM  Result Value Ref Range   Troponin I (High Sensitivity) 19 (H) <18 ng/L    Comment: (NOTE) Elevated high sensitivity troponin I (hsTnI) values and significant  changes across serial measurements may suggest ACS but many other  chronic and acute conditions are known to elevate hsTnI results.  Refer to the "Links" section for chest pain algorithms and additional  guidance. Performed at [facility], [place] [address] [place],  [address]    DG Chest 2 View  Result Date: 11/05/2021 CLINICAL DATA:  Weakness. Additional history provided: Weakness and joint pain, nausea/vomiting. Dialysis patient. EXAM: CHEST - 2 VIEW COMPARISON:  Prior chest radiographs 05/06/2021 and earlier. FINDINGS: Cardiomegaly. Central pulmonary vascular congestion with interstitial edema. Bilateral pleural effusions (small to moderate right, small left). No evidence of the thorax. No acute bony abnormality identified. Surgical clips within the right axilla. IMPRESSION: Cardiomegaly. Central pulmonary vascular congestion with interstitial edema. Bilateral pleural effusions (small to moderate right, small left). Electronically Signed   By: Kellie Simmering D.O.   On: 11/05/2021 18:37   CT Head Wo Contrast  Result Date: 11/05/2021 CLINICAL DATA:  Weakness and hypertension.  EXAM: CT HEAD WITHOUT CONTRAST TECHNIQUE: Contiguous axial images were obtained from the base of the skull through the vertex without intravenous contrast. RADIATION DOSE REDUCTION: This exam was performed according to the departmental dose-optimization program which includes automated exposure control, adjustment of the mA and/or kV according to patient size and/or use of iterative reconstruction technique. COMPARISON:  MRI brain dated  May 07, 2021. CT head dated August 02, 2020. FINDINGS: Brain: No evidence of acute infarction, hemorrhage, hydrocephalus, extra-axial collection or mass lesion/mass effect. Stable age advanced mild cerebral atrophy and moderate chronic microvascular ischemic changes. Vascular: Atherosclerotic vascular calcification of the carotid siphons. No hyperdense vessel. Skull: Normal. Negative for fracture or focal lesion. Sinuses/Orbits: No acute finding. Chronic bilateral vitreous hemorrhage and progressive right phthisis bulbi. Other: None. IMPRESSION: 1. No acute intracranial abnormality. 2. Stable age advanced mild cerebral atrophy and moderate chronic microvascular ischemic changes. Electronically Signed   By: Titus Dubin M.D.   On: 11/05/2021 18:00    Pending Labs Unresulted Labs (From admission, onward)     Start     Ordered   11/06/21 0500  Comprehensive metabolic panel  Tomorrow morning,   R        11/05/21 2043   11/06/21 0500  CBC  Tomorrow morning,   R        11/05/21 2043   11/05/21 2044  Vitamin B12  Add-on,   AD        11/05/21 2043   11/05/21 2043  Magnesium  Add-on,   AD        11/05/21 2043   11/05/21 2043  Brain natriuretic peptide  Add-on,   AD        11/05/21 2043   11/05/21 2043  TSH  Add-on,   AD        11/05/21 2043   11/05/21 1735  CBC  Once,   STAT        11/05/21 1735            Vitals/Pain Today's Vitals   11/05/21 2030 11/05/21 2045 11/05/21 2104 11/05/21 2105  BP: (!) 183/99 (!) 187/99    Pulse: 77 78    Resp: 14      Temp:      TempSrc:      SpO2: 100% 100%  100%  PainSc:   10-Worst pain ever     Isolation Precautions No active isolations  Medications Medications  amLODipine (NORVASC) tablet 5 mg (has no administration in time range)  atorvastatin (LIPITOR) tablet 40 mg (has no administration in time range)  hydrALAZINE (APRESOLINE) tablet 25 mg (has no administration in time range)  calcium acetate (PHOSLO) capsule 667 mg (has no administration in time range)  ferrous sulfate tablet 325 mg (has no administration in time range)  vitamin B-12 (CYANOCOBALAMIN) tablet 1,000 mcg (has no administration in time range)  atropine 1 % ophthalmic solution 1 drop (has no administration in time range)  heparin injection 5,000 Units (has no administration in time range)  sodium chloride flush (NS) 0.9 % injection 3 mL (has no administration in time range)  acetaminophen (TYLENOL) tablet 650 mg (has no administration in time range)    Or  acetaminophen (TYLENOL) suppository 650 mg (has no administration in time range)  polyethylene glycol (MIRALAX / GLYCOLAX) packet 17 g (has no administration in time range)    Mobility Unsteady gait with ambulation, has manual wheelchair from home with him  Low fall risk   Focused Assessments     R Recommendations: See Admitting Provider Note  Report given to:   Additional Notes:   Pt is legally blind ensure to verbally locate necessary things

## 2021-11-05 NOTE — ED Triage Notes (Signed)
BIB [facility] from dialysis clinic. NV last night many times. Hypotensive with dialysis- given fluid bolus. Hypertensive with EMS. Received full treatment of dialysis today. Right restricted. Complains of weakness, joint pain. Pt is blind.

## 2021-11-05 NOTE — ED Provider Notes (Signed)
[facility] EMERGENCY DEPARTMENT Provider Note   CSN: [id number] Arrival date & time: 11/05/21  1609     History  Chief Complaint  Patient presents with   Nausea   Emesis    Christopher Davila is a 51 y.o. male with a past medical history of ESRD on dialysis (Monday Wednesday Friday) who presents to the emergency department brought in by EMS from dialysis clinic.  Patient notes that he has had nausea and vomiting starting 2 days ago.  Patient notes that he fell a couple days ago while at [name] and hit his head on the trash can.  He denies being evaluated after that and noted that he did not want to be evaluated at that time. He lives at [name].  Patient notes he is on 2 L of oxygen continuously.  He complains of generalized weakness, joint pain.  No meds tried prior to arrival.  Denies fever, nasal congestion, rhinorrhea, cough, shortness of breath, chest pain, abdominal pain, diarrhea, constipation, dysuria.  Per EMS report, patient was hypotensive with dialysis, was given a fluid bolus.  Then in turn he was hypertensive with EMS.  He received a full treatment of his dialysis today.   The history is provided by the patient. No language interpreter was used.      Home Medications Prior to Admission medications   Medication Sig Start Date End Date Taking? Authorizing Provider  albuterol (VENTOLIN HFA) 108 (90 Base) MCG/ACT inhaler Inhale 2 puffs into the lungs every 4 (four) hours as needed for shortness of breath.    [provider]  amLODipine (NORVASC) 10 MG tablet Take 0.5 tablets (5 mg total) by mouth daily. 05/10/21   Nita Sells, MD  aspirin EC 81 MG tablet Take 1 tablet (81 mg total) by mouth daily. 05/24/15   Ghimire, Henreitta Leber, MD  atorvastatin (LIPITOR) 40 MG tablet Take 1 tablet (40 mg total) by mouth daily. 12/05/20   Martinique, Peter M, MD  atropine 1 % ophthalmic solution Place 1 drop into both eyes in the morning and at bedtime.  04/24/21   [provider]  brimonidine (ALPHAGAN P) 0.1 % SOLN Place 1 drop into the left eye in the morning and at bedtime.    [provider]  calcium acetate (PHOSLO) 667 MG capsule Take 1 capsule (667 mg total) by mouth 3 (three) times daily with meals. 11/19/19   Charlynne Cousins, MD  Dextran 70-Hypromellose (TEARS PURE) 0.1-0.3 % SOLN Place 1 drop into both eyes in the morning, at noon, in the evening, and at bedtime.    [provider]  dorzolamide-timolol (COSOPT) 22.3-6.8 MG/ML ophthalmic solution Place 1 drop into the left eye 2 (two) times daily. 09/23/19   Geradine Girt, DO  ferrous sulfate 325 (65 FE) MG tablet Take 325 mg by mouth daily. 08/25/20   [provider]  guaiFENesin (ROBITUSSIN) 100 MG/5ML liquid Take 100 mg by mouth every 4 (four) hours as needed for cough.    [provider]  hydrALAZINE (APRESOLINE) 25 MG tablet Take 25 mg by mouth 2 (two) times daily.    [provider]  latanoprost (XALATAN) 0.005 % ophthalmic solution Place 1 drop into the left eye at bedtime. 09/23/19   Geradine Girt, DO  losartan (COZAAR) 100 MG tablet Take 1 tablet (100 mg total) by mouth daily. 11/19/19   Charlynne Cousins, MD  Melatonin 10 MG CAPS Take 10 mg by mouth at bedtime.  [provider]  olopatadine (PATANOL) 0.1 % ophthalmic solution Place 2 drops into both eyes daily.    [provider]  ondansetron (ZOFRAN) 4 MG tablet Take 4 mg by mouth every 6 (six) hours as needed for nausea or vomiting.    [provider]  oxyCODONE-acetaminophen (PERCOCET/ROXICET) 5-325 MG tablet Take 1 tablet by mouth See admin instructions. 1 tablet once daily every Tuesday, Thursday, Saturday, and Sunday. May take an additional 1 tablet every 6 hours as needed on Monday's Wednesday's, and Friday's. 05/10/21   Nita Sells, MD  OXYGEN Inhale 3 L into the lungs continuous.    [provider]  polyethylene glycol  (MIRALAX / GLYCOLAX) 17 g packet Take 17 g by mouth daily.    [provider]  prednisoLONE acetate (PRED FORTE) 1 % ophthalmic suspension Place 1 drop into both eyes 4 (four) times daily.    [provider]  pregabalin (LYRICA) 25 MG capsule Take 1 capsule (25 mg total) by mouth 2 (two) times daily. 05/10/21   Nita Sells, MD  senna (SENOKOT) 8.6 MG TABS tablet Take 2 tablets by mouth at bedtime.    [provider]  sertraline (ZOLOFT) 25 MG tablet Take 1 tablet (25 mg total) by mouth daily. On Monday's, Wednesday's, and Friday's, take after dialysis. All other days take in the morning. 05/10/21   Nita Sells, MD  torsemide (DEMADEX) 20 MG tablet Take 80 mg by mouth 2 (two) times daily.    [provider]  vitamin B-12 1000 MCG tablet Take 1 tablet (1,000 mcg total) by mouth daily. 05/11/21   Nita Sells, MD  Vitamin D, Ergocalciferol, (DRISDOL) 1.25 MG (50000 UNIT) CAPS capsule Take 50,000 Units by mouth every 7 (seven) days. Sunday's    [provider]      Allergies    Morphine and related    Review of Systems   Review of Systems  Constitutional:  Positive for fatigue. Negative for fever.  HENT:  Negative for congestion and rhinorrhea.   Respiratory:  Negative for cough and shortness of breath.   Cardiovascular:  Negative for chest pain.  Gastrointestinal:  Positive for nausea and vomiting. Negative for abdominal pain, constipation and diarrhea.  Genitourinary:  Negative for dysuria.  Musculoskeletal:  Positive for myalgias.  All other systems reviewed and are negative.  Physical Exam Updated Vital Signs BP (!) 187/153   Pulse 80   Temp 98.4 F (36.9 C) (Oral)   Resp (!) 27   SpO2 100%  Physical Exam Vitals and nursing note reviewed.  Constitutional:      General: He is not in acute distress.    Appearance: He is not diaphoretic.  HENT:     Head: Normocephalic and atraumatic.     Mouth/Throat:      Pharynx: No oropharyngeal exudate.  Eyes:     General: No scleral icterus.    Conjunctiva/sclera: Conjunctivae normal.  Cardiovascular:     Rate and Rhythm: Normal rate and regular rhythm.     Pulses: Normal pulses.     Heart sounds: Normal heart sounds.  Pulmonary:     Effort: Pulmonary effort is normal. No respiratory distress.     Breath sounds: Normal breath sounds. No wheezing.  Abdominal:     General: Bowel sounds are normal.     Palpations: Abdomen is soft. There is no mass.     Tenderness: There is no abdominal tenderness. There is no guarding or rebound.  Musculoskeletal:  General: Normal range of motion.     Cervical back: Normal range of motion and neck supple.     Comments: Thrill noted to right AV fistula site.   Skin:    General: Skin is warm and dry.  Neurological:     Mental Status: He is alert.  Psychiatric:        Behavior: Behavior normal.    ED Results / Procedures / Treatments   Labs (all labs ordered are listed, but only abnormal results are displayed) Labs Reviewed  COMPREHENSIVE METABOLIC PANEL - Abnormal; Notable for the following components:      Result Value   Sodium 134 (*)    Chloride 93 (*)    Glucose, Bld 111 (*)    BUN 33 (*)    Creatinine, Ser 5.12 (*)    Total Protein 8.2 (*)    GFR, Estimated 13 (*)    All other components within normal limits  CBC - Abnormal; Notable for the following components:   RBC 3.70 (*)    Hemoglobin 10.7 (*)    HCT 33.1 (*)    RDW 17.7 (*)    All other components within normal limits  TROPONIN I (HIGH SENSITIVITY) - Abnormal; Notable for the following components:   Troponin I (High Sensitivity) 18 (*)    All other components within normal limits  TROPONIN I (HIGH SENSITIVITY) - Abnormal; Notable for the following components:   Troponin I (High Sensitivity) 19 (*)    All other components within normal limits  RESP PANEL BY RT-PCR (FLU A&B, COVID) ARPGX2  LIPASE, BLOOD  URINALYSIS, ROUTINE W  REFLEX MICROSCOPIC  CBC    EKG EKG Interpretation  Date/Time:  Monday Nov 05 2021 16:19:01 EDT Ventricular Rate:  82 PR Interval:  170 QRS Duration: 134 QT Interval:  420 QTC Calculation: 491 R Axis:   51 Text Interpretation: Sinus rhythm Nonspecific intraventricular conduction delay ST elevation suggests acute pericarditis Confirmed by Dene Gentry [phone]) on 11/05/2021 4:35:04 PM  Radiology DG Chest 2 View  Result Date: 11/05/2021 CLINICAL DATA:  Weakness. Additional history provided: Weakness and joint pain, nausea/vomiting. Dialysis patient. EXAM: CHEST - 2 VIEW COMPARISON:  Prior chest radiographs 05/06/2021 and earlier. FINDINGS: Cardiomegaly. Central pulmonary vascular congestion with interstitial edema. Bilateral pleural effusions (small to moderate right, small left). No evidence of the thorax. No acute bony abnormality identified. Surgical clips within the right axilla. IMPRESSION: Cardiomegaly. Central pulmonary vascular congestion with interstitial edema. Bilateral pleural effusions (small to moderate right, small left). Electronically Signed   By: Kellie Simmering D.O.   On: 11/05/2021 18:37   CT Head Wo Contrast  Result Date: 11/05/2021 CLINICAL DATA:  Weakness and hypertension. EXAM: CT HEAD WITHOUT CONTRAST TECHNIQUE: Contiguous axial images were obtained from the base of the skull through the vertex without intravenous contrast. RADIATION DOSE REDUCTION: This exam was performed according to the departmental dose-optimization program which includes automated exposure control, adjustment of the mA and/or kV according to patient size and/or use of iterative reconstruction technique. COMPARISON:  MRI brain dated May 07, 2021. CT head dated August 02, 2020. FINDINGS: Brain: No evidence of acute infarction, hemorrhage, hydrocephalus, extra-axial collection or mass lesion/mass effect. Stable age advanced mild cerebral atrophy and moderate chronic microvascular ischemic changes.  Vascular: Atherosclerotic vascular calcification of the carotid siphons. No hyperdense vessel. Skull: Normal. Negative for fracture or focal lesion. Sinuses/Orbits: No acute finding. Chronic bilateral vitreous hemorrhage and progressive right phthisis bulbi. Other: None. IMPRESSION: 1. No acute intracranial abnormality.  2. Stable age advanced mild cerebral atrophy and moderate chronic microvascular ischemic changes. Electronically Signed   By: Titus Dubin M.D.   On: 11/05/2021 18:00    Procedures Procedures    Medications Ordered in ED Medications - No data to display  ED Course/ Medical Decision Making/ A&P Clinical Course as of 11/05/21 2052  Mon Nov 05, 2021  2018 Notified by RN that upon attempting to ambulate patient, patient was unstable on his feet.  Needed a lot of assistance by the RN to aid in standing and noted that he does not think he would be able to take a step forward. [SB]  2035 Consult with hospitalist, Dr. Trilby Drummer who agrees with admission at this time.  [SB]  2039 Notified patient of admission to the hospital, patient agreeable at this time. [SB]    Clinical Course User Index [SB] Danner Paulding A, PA-C                           Medical Decision Making Amount and/or Complexity of Data Reviewed Labs: ordered. Radiology: ordered.  Risk Decision regarding hospitalization.   Pt presents with nausea and emesis onset 2 days ago.  Patient has a history of ESRD on dialysis Monday Wednesday Friday.  Received full treatment of dialysis today.  Patient typically ambulates with a walker at [name].  Patient was last able to ambulate with his walker 3 days ago prior to the onset of his generalized weakness. Vital signs, patient afebrile. On exam, pt with no acute cardiovascular, respiratory, abdominal exam findings. Differential diagnosis includes anemia, pneumonia, arrhythmia, acute cystitis, intracranial abnormality, electrolyte abnormality.    Co morbidities that  complicate the patient evaluation: ESRD on dialysis (MWF) Type 2 DM HTN HFpEF  Additional history obtained:  Additional history obtained from EMS: Per EMS report, patient was hypotensive with dialysis, was given a fluid bolus.  Then in turn he was hypertensive with EMS.  He received a full treatment of his dialysis today.  Labs:  I ordered, and personally interpreted labs.  The pertinent results include:   Lipase at 32 and unremarkable. CBC without leukocytosis and unremarkable. COVID flu swab negative CMP with slightly elevated glucose at 111, BUN elevated at 33, creatinine elevated at 5.12, GFR at 13  Imaging: I ordered imaging studies including CT head, chest x-ray I independently visualized and interpreted imaging which showed: CT head:  1. No acute intracranial abnormality.  2. Stable age advanced mild cerebral atrophy and moderate chronic  microvascular ischemic changes.   CXR:  Cardiomegaly.     Central pulmonary vascular congestion with interstitial edema.     Bilateral pleural effusions (small to moderate right, small left).   I agree with the radiologist interpretation   Consultations: I requested consultation with the Hospitalist, Dr. Trilby Drummer and discussed lab and imaging findings as well as pertinent plan - they recommend: admission at this time  Social Determinants of Health: Patient lives in [place]   Disposition: Presentation suspicious for generalized weakness.  Doubt ACS, COVID, flu, pneumonia, anemia, electrolyte abnormality at this time.  Doubt intracranial abnormality.  After consideration of the diagnostic results and the patients response to treatment, I feel that the patient would benefit from Admission to the hospital. Supportive care measures and strict return precautions discussed with patient at bedside. Pt acknowledges and verbalizes understanding. Pt appears safe for discharge. Follow up as indicated in discharge paperwork.    This chart was  dictated using  voice recognition software, Diplomatic Services operational officer. Despite the best efforts of this provider to proofread and correct errors, errors may still occur which can change documentation meaning.  Final Clinical Impression(s) / ED Diagnoses Final diagnoses:  Generalized weakness  Nausea and vomiting, unspecified vomiting type    Rx / DC Orders ED Discharge Orders     None         Lessie Manigo A, PA-C 11/05/21 2052    Valarie Merino, MD 11/07/21 1535

## 2021-11-06 ENCOUNTER — Ambulatory Visit: Payer: Medicare Other | Admitting: Podiatry

## 2021-11-06 ENCOUNTER — Ambulatory Visit: Payer: Medicare Other | Admitting: Internal Medicine

## 2021-11-06 ENCOUNTER — Encounter (HOSPITAL_COMMUNITY): Payer: Self-pay | Admitting: Internal Medicine

## 2021-11-06 DIAGNOSIS — D638 Anemia in other chronic diseases classified elsewhere: Secondary | ICD-10-CM | POA: Diagnosis not present

## 2021-11-06 DIAGNOSIS — R531 Weakness: Secondary | ICD-10-CM | POA: Diagnosis not present

## 2021-11-06 DIAGNOSIS — N186 End stage renal disease: Secondary | ICD-10-CM | POA: Diagnosis not present

## 2021-11-06 DIAGNOSIS — I10 Essential (primary) hypertension: Secondary | ICD-10-CM | POA: Diagnosis not present

## 2021-11-06 LAB — COMPREHENSIVE METABOLIC PANEL
ALT: 15 U/L (ref 0–44)
AST: 16 U/L (ref 15–41)
Albumin: 3.2 g/dL — ABNORMAL LOW (ref 3.5–5.0)
Alkaline Phosphatase: 80 U/L (ref 38–126)
Anion gap: 13 (ref 5–15)
BUN: 39 mg/dL — ABNORMAL HIGH (ref 6–20)
CO2: 26 mmol/L (ref 22–32)
Calcium: 8.8 mg/dL — ABNORMAL LOW (ref 8.9–10.3)
Chloride: 97 mmol/L — ABNORMAL LOW (ref 98–111)
Creatinine, Ser: 6.16 mg/dL — ABNORMAL HIGH (ref 0.61–1.24)
GFR, Estimated: 10 mL/min — ABNORMAL LOW (ref >60)
Glucose, Bld: 90 mg/dL (ref 70–99)
Potassium: 3.8 mmol/L (ref 3.5–5.1)
Sodium: 136 mmol/L (ref 135–145)
Total Bilirubin: 0.6 mg/dL (ref 0.3–1.2)
Total Protein: 7.2 g/dL (ref 6.5–8.1)

## 2021-11-06 LAB — CBC
HCT: 30.1 % — ABNORMAL LOW (ref 39.0–52.0)
Hemoglobin: 9.5 g/dL — ABNORMAL LOW (ref 13.0–17.0)
MCH: 28.5 pg (ref 26.0–34.0)
MCHC: 31.6 g/dL (ref 30.0–36.0)
MCV: 90.4 fL (ref 80.0–100.0)
Platelets: 177 10*3/uL (ref 150–400)
RBC: 3.33 MIL/uL — ABNORMAL LOW (ref 4.22–5.81)
RDW: 17.6 % — ABNORMAL HIGH (ref 11.5–15.5)
WBC: 7.6 10*3/uL (ref 4.0–10.5)
nRBC: 0 % (ref 0.0–0.2)

## 2021-11-06 LAB — GLUCOSE, CAPILLARY
Glucose-Capillary: 84 mg/dL (ref 70–99)
Glucose-Capillary: 76 mg/dL (ref 70–99)
Glucose-Capillary: 175 mg/dL — ABNORMAL HIGH (ref 70–99)
Glucose-Capillary: 148 mg/dL — ABNORMAL HIGH (ref 70–99)
Glucose-Capillary: 156 mg/dL — ABNORMAL HIGH (ref 70–99)

## 2021-11-06 LAB — VITAMIN B12: Vitamin B-12: 2337 pg/mL — ABNORMAL HIGH (ref 180–914)

## 2021-11-06 LAB — HEPATITIS B SURFACE ANTIBODY,QUALITATIVE: Hep B S Ab: REACTIVE — AB

## 2021-11-06 LAB — HEPATITIS B SURFACE ANTIGEN: Hepatitis B Surface Ag: NONREACTIVE

## 2021-11-06 LAB — PHOSPHORUS: Phosphorus: 4.7 mg/dL — ABNORMAL HIGH (ref 2.5–4.6)

## 2021-11-06 LAB — TSH: TSH: 1.776 u[IU]/mL (ref 0.350–4.500)

## 2021-11-06 MED ORDER — DIPHENHYDRAMINE HCL 25 MG PO CAPS
25.0000 mg | ORAL_CAPSULE | Freq: Three times a day (TID) | ORAL | Status: AC | PRN
  Administered 2021-11-06 – 2021-11-07 (×3): 25 mg via ORAL
  Filled 2021-11-06 (×3): qty 1

## 2021-11-06 MED ORDER — CHLORHEXIDINE GLUCONATE CLOTH 2 % EX PADS
6.0000 | MEDICATED_PAD | Freq: Every day | CUTANEOUS | Status: DC
  Administered 2021-11-07: 6 via TOPICAL

## 2021-11-06 MED ORDER — DARBEPOETIN ALFA 100 MCG/0.5ML IJ SOSY
100.0000 ug | PREFILLED_SYRINGE | INTRAMUSCULAR | Status: DC
  Administered 2021-11-07: 100 ug via INTRAVENOUS
  Filled 2021-11-06: qty 0.5

## 2021-11-06 MED ORDER — TORSEMIDE 20 MG PO TABS: 100.0000 mg | ORAL_TABLET | Freq: Two times a day (BID) | ORAL | Status: DC

## 2021-11-06 MED ORDER — LIP MEDEX EX OINT
TOPICAL_OINTMENT | CUTANEOUS | Status: DC | PRN
  Filled 2021-11-06: qty 7

## 2021-11-06 NOTE — Evaluation (Signed)
Occupational Therapy Evaluation Patient Details Name: Christopher Davila MRN: [id number] DOB: 1970/07/10 Today's Date: 11/06/2021   History of Present Illness Mcclain Shall is a 51 y.o. male admitted under observation 11/05/21 from dialysis center with hypotension initially now with weakness. Fall where he hit his head at [name] (he resides there). PMH includes diabetes, gastroparesis, hypertension, hyperlipidemia, anemia, ESRD on HD, CHF, blindness   Clinical Impression   Pt is typically min guard for transfers into WC, and with assist likes to ambulate short distances with RW. He is set up for most UB ADL/Grooming, min guard assist for LB ADL. He reports that he can complete toilet transfers from his [facility] and perform peri care at mod I. Today he reports feeling generally weak and needs min to mod A for transfers, increased assist for ADL - max A for LB dressing, increased support for other ADL - also suspect unfamiliar environment due to baseline visual deficits. WOuld like to see if [facility] could visit him at [place] and assist him in that environment. OT will continue to work with him acutely with focus on activity tolerance, generalized weakness, and to work on transfers and maximize safety and independence in ADL.      Recommendations for follow up therapy are one component of a multi-disciplinary discharge planning process, led by the attending physician.  Recommendations may be updated based on patient status, additional functional criteria and insurance authorization.   Follow Up Recommendations  Skilled nursing-short term rehab (<3 hours/day)    Assistance Recommended at Discharge Intermittent Supervision/Assistance  Patient can return home with the following A little help with walking and/or transfers;A lot of help with bathing/dressing/bathroom;Assist for transportation    Functional Status Assessment  Patient has had a recent decline in their functional status and  demonstrates the ability to make significant improvements in function in a reasonable and predictable amount of time.  Equipment Recommendations  None recommended by OT    Recommendations for Other Services PT consult;Other (comment) ([facility])     Precautions / Restrictions Precautions Precautions: Fall Precaution Comments: BP, Blind Restrictions Weight Bearing Restrictions: No      Mobility Bed Mobility Overal bed mobility: Needs Assistance Bed Mobility: Supine to Sit     Supine to sit: Supervision     General bed mobility comments: increased time, verbal cues for placement due to visual deficits    Transfers Overall transfer level: Needs assistance Equipment used: 1 person hand held assist Transfers: Sit to/from Stand, Bed to chair/wheelchair/BSC Sit to Stand: Min assist     Step pivot transfers: Min assist     General transfer comment: feelings of general wooziness      Balance Overall balance assessment: Mild deficits observed, not formally tested                                         ADL either performed or assessed with clinical judgement   ADL Overall ADL's : Needs assistance/impaired Eating/Feeding: Set up   Grooming: Set up;Sitting   Upper Body Bathing: Set up;Sitting   Lower Body Bathing: Moderate assistance;Sitting/lateral leans   Upper Body Dressing : Minimal assistance;Sitting   Lower Body Dressing: Maximal assistance;Sit to/from stand   Toilet Transfer: Moderate assistance;Stand-pivot;BSC/3in1   Toileting- Clothing Manipulation and Hygiene: Moderate assistance;Sit to/from stand       Functional mobility during ADLs: Minimal assistance (1  person HHA) General ADL Comments: generalized weakness and deconditioning as well as decreased activity tolerance impacting ability to perform ADL     Vision Baseline Vision/History: 2 Legally blind Ability to See in Adequate Light: 4 Severely impaired Patient  Visual Report: No change from baseline       Perception     Praxis      Pertinent Vitals/Pain Pain Assessment Pain Assessment: No/denies pain     Hand Dominance Right   Extremity/Trunk Assessment Upper Extremity Assessment Upper Extremity Assessment: Overall WFL for tasks assessed   Lower Extremity Assessment Lower Extremity Assessment: Defer to PT evaluation       Communication Communication Communication: No difficulties   Cognition Arousal/Alertness: Awake/alert Behavior During Therapy: WFL for tasks assessed/performed Overall Cognitive Status: Within Functional Limits for tasks assessed                                 General Comments: tangential but easy to refocus, follows commands     General Comments       Exercises     Shoulder Instructions      Home Living Family/patient expects to be discharged to:: Skilled nursing facility                                        Prior Functioning/Environment Prior Level of Function : Needs assist             Mobility Comments: Pt gets assists for transfers due to visual deficits ADLs Comments: Pt is set up for most ADL from seated position. He does toilet transfers and peri care mod I per his report        OT Problem List: Decreased activity tolerance;Impaired vision/perception      OT Treatment/Interventions: Self-care/ADL training;Energy conservation;Therapeutic activities;Patient/family education;Balance training    OT Goals(Current goals can be found in the care plan section) Acute Rehab OT Goals Patient Stated Goal: get my strength back and be able to do for myself OT Goal Formulation: With patient Time For Goal Achievement: 11/20/21 Potential to Achieve Goals: Good ADL Goals Pt Will Perform Grooming: with set-up;sitting Pt Will Perform Upper Body Dressing: with set-up;sitting Pt Will Perform Lower Body Dressing: with supervision;sit to/from stand Pt Will Transfer  to Toilet: with supervision;stand pivot transfer Pt Will Perform Toileting - Clothing Manipulation and hygiene: with supervision;sit to/from stand Pt Will Perform Tub/Shower Transfer: with supervision;shower seat Additional ADL Goal #1: Pt will verbalize 3 ways of conserving energy during ADL with no cues  OT Frequency: Min 2X/week    Co-evaluation              AM-PAC OT "6 Clicks" Daily Activity     Outcome Measure Help from another person eating meals?: A Little Help from another person taking care of personal grooming?: A Little Help from another person toileting, which includes using toliet, bedpan, or urinal?: A Lot Help from another person bathing (including washing, rinsing, drying)?: A Lot Help from another person to put on and taking off regular upper body clothing?: A Little Help from another person to put on and taking off regular lower body clothing?: A Lot 6 Click Score: 15   End of Session Equipment Utilized During Treatment: Gait belt Nurse Communication: Mobility status;Other (comment) (BP (see PT note))  Activity Tolerance: Patient tolerated treatment well Patient left:  in chair;with call bell/phone within reach;with chair alarm set  OT Visit Diagnosis: Unsteadiness on feet (R26.81);Muscle weakness (generalized) (M62.81);Low vision, both eyes (H54.2)                Time: 8242-3536 OT Time Calculation (min): 17 min Charges:  OT General Charges $OT Visit: 1 Visit OT Evaluation $OT Eval Moderate Complexity: [place] OTR/L Acute Rehabilitation Services Pager: [phone] Office: [place] 11/06/2021, 1:33 PM

## 2021-11-06 NOTE — Plan of Care (Signed)
  Problem: Clinical Measurements: Goal: Complications related to the disease process or treatment will be avoided or minimized Outcome: Progressing   Problem: Health Behavior/Discharge Planning: Goal: Ability to manage health-related needs will improve Outcome: Progressing

## 2021-11-06 NOTE — Plan of Care (Signed)
?  Problem: Clinical Measurements: ?Goal: Will remain free from infection ?Outcome: Progressing ?  ?

## 2021-11-06 NOTE — Evaluation (Addendum)
Physical Therapy Evaluation Patient Details Name: Lowery Paullin MRN: [id number] DOB: 15-Oct-1970 Today's Date: 11/06/2021  History of Present Illness  Edmar Blankenburg is a 51 y.o. male admitted under observation 11/05/21 from dialysis center with hypotension initially now with weakness. Fall where he hit his head at [name] (he resides there). PMH includes diabetes, gastroparesis, hypertension, hyperlipidemia, anemia, ESRD on HD, CHF, blindness  Clinical Impression   Pt admitted with above diagnosis. Lives at [facility], where he reports he has had a good experinece working with PT in the past; Prior to admission, pt was able to transfer to wheelcahir and amb short distances with RW; Presents to PT with generalized weakness and decr activity tolerance; Needs min/mod assist with transfers, and cueing/description of environment to help accomodate for blindness; Participates well, and worth considering another course of PT at SNF;  Pt currently with functional limitations due to the deficits listed below (see PT Problem List). Pt will benefit from skilled PT to increase their independence and safety with mobility to allow discharge to the venue listed below.          Recommendations for follow up therapy are one component of a multi-disciplinary discharge planning process, led by the attending physician.  Recommendations may be updated based on patient status, additional functional criteria and insurance authorization.  Follow Up Recommendations Skilled nursing-short term rehab (<3 hours/day) (Recommend starting another course of PT at [facility])    Assistance Recommended at Discharge Intermittent Supervision/Assistance  Patient can return home with the following  A little help with walking and/or transfers;A little help with bathing/dressing/bathroom    Equipment Recommendations None recommended by PT  Recommendations for Other Services       Functional Status Assessment Patient has had a recent  decline in their functional status and demonstrates the ability to make significant improvements in function in a reasonable and predictable amount of time.     Precautions / Restrictions Precautions Precautions: Fall Precaution Comments: BP, Blind Restrictions Weight Bearing Restrictions: No      Mobility  Bed Mobility Overal bed mobility: Needs Assistance Bed Mobility: Supine to Sit     Supine to sit: Supervision     General bed mobility comments: increased time, verbal cues for placement due to visual deficits    Transfers Overall transfer level: Needs assistance Equipment used: 1 person hand held assist Transfers: Sit to/from Stand, Bed to chair/wheelchair/BSC Sit to Stand: Min assist   Step pivot transfers: Min assist       General transfer comment: feelings of general wooziness    Ambulation/Gait                  Stairs            Wheelchair Mobility    Modified Rankin (Stroke Patients Only)       Balance Overall balance assessment: Mild deficits observed, not formally tested                                           Pertinent Vitals/Pain Pain Assessment Pain Assessment: No/denies pain    Home Living Family/patient expects to be discharged to:: Skilled nursing facility                        Prior Function Prior Level of Function : Needs assist  Mobility Comments: Pt gets assists for transfers due to visual deficits ADLs Comments: Pt is set up for most ADL from seated position. He does toilet transfers and peri care mod I per his report     Hand Dominance   Dominant Hand: Right    Extremity/Trunk Assessment   Upper Extremity Assessment Upper Extremity Assessment: Defer to OT evaluation    Lower Extremity Assessment Lower Extremity Assessment: Generalized weakness       Communication   Communication: No difficulties  Cognition Arousal/Alertness: Awake/alert Behavior During  Therapy: WFL for tasks assessed/performed Overall Cognitive Status: Within Functional Limits for tasks assessed                                 General Comments: tangential but easy to refocus, follows commands        General Comments General comments (skin integrity, edema, etc.): Pt overall hypertensive throughout session in supine, sitiing, standing; reports wooziness in standing, was concurrent with a systolic BP drop of 15 mmHg  See vitals flow sheet for orthostatic BPs    Exercises     Assessment/Plan    PT Assessment Patient needs continued PT services  PT Problem List Decreased strength;Decreased activity tolerance;Decreased balance;Decreased mobility;Decreased knowledge of use of DME       PT Treatment Interventions DME instruction;Gait training;Functional mobility training;Therapeutic activities;Therapeutic exercise;Balance training;Cognitive remediation;Patient/family education    PT Goals (Current goals can be found in the Care Plan section)  Acute Rehab PT Goals Patient Stated Goal: Would like to work with PT PT Goal Formulation: With patient Time For Goal Achievement: 11/20/21 Potential to Achieve Goals: Good    Frequency Min 2X/week     Co-evaluation               AM-PAC PT "6 Clicks" Mobility  Outcome Measure Help needed turning from your back to your side while in a flat bed without using bedrails?: A Little Help needed moving from lying on your back to sitting on the side of a flat bed without using bedrails?: A Little Help needed moving to and from a bed to a chair (including a wheelchair)?: A Little Help needed standing up from a chair using your arms (e.g., wheelchair or bedside chair)?: A Little Help needed to walk in hospital room?: A Lot Help needed climbing 3-5 steps with a railing? : Total 6 Click Score: 15    End of Session Equipment Utilized During Treatment: Gait belt Activity Tolerance: Patient tolerated treatment  well Patient left: in chair;with call bell/phone within reach;with chair alarm set Nurse Communication: Mobility status PT Visit Diagnosis: Unsteadiness on feet (R26.81);Muscle weakness (generalized) (M62.81)    Time: 4270-6237 PT Time Calculation (min) (ACUTE ONLY): 19 min   Charges:   PT Evaluation $PT Eval Moderate Complexity: [place], [place] Office [phone]   Colletta [place] 11/06/2021, 2:25 PM

## 2021-11-06 NOTE — TOC Initial Note (Signed)
Transition of Care [facility]) - Initial/Assessment Note    Patient Details  Name: Christopher Davila MRN: [id number] Date of Birth: 01-Jan-1971  [facility]) CM/SW Contact:    Milinda Antis, LCSWA Phone Number: 11/06/2021, 1:20 PM  Clinical Narrative:                 CSW contacted Crystal in admissions at [facility].  The patient is LTC at the facility and can return when medically ready.  TOC will continue to follow.    Expected Discharge Plan: [place] Barriers to Discharge: Continued Medical Work up   Patient Goals and CMS Choice        Expected Discharge Plan and Services Expected Discharge Plan: [place]       Living arrangements for the past 2 months: [place]                                      Prior Living Arrangements/Services Living arrangements for the past 2 months: [place] Lives with:: Facility Resident Patient language and need for interpreter reviewed:: Yes        Need for Family Participation in Patient Care: No (Comment) Care giver support system in place?: Yes (comment)   Criminal Activity/Legal Involvement Pertinent to Current Situation/Hospitalization: No - Comment as needed  Activities of Daily Living Home Assistive Devices/Equipment: Gilford Rile (specify type) ADL Screening (condition at time of admission) Patient's cognitive ability adequate to safely complete daily activities?: Yes Is the patient deaf or have difficulty hearing?: No Does the patient have difficulty seeing, even when wearing glasses/contacts?: Yes Does the patient have difficulty concentrating, remembering, or making decisions?: No Patient able to express need for assistance with ADLs?: Yes Does the patient have difficulty dressing or bathing?: No Independently performs ADLs?: Yes (appropriate for developmental age) Does the patient have difficulty walking or climbing stairs?: Yes Weakness of Legs:  Both Weakness of Arms/Hands: None  Permission Sought/Granted                  Emotional Assessment              Admission diagnosis:  Generalized weakness [R53.1] Nausea and vomiting, unspecified vomiting type [R11.2] Patient Active Problem List   Diagnosis Date Noted   Generalized weakness 11/05/2021   Pain due to onychomycosis of toenails of both feet 07/31/2021   Skin lesion 07/31/2021   End-stage renal disease on hemodialysis [facility])    History of thoracentesis    Pressure injury of skin 11/06/2019   ESRD on dialysis ([place])    Heart failure with preserved ejection fraction ([place])    Goals of care, counseling/discussion    Weakness 10/05/2019   Vision loss of left eye 09/21/2019   Homeless 09/19/2019   Anemia of chronic disease 09/19/2019   Vision loss, left eye 09/19/2019   Acute loss of vision, right 06/16/2019   Alcohol use    Gastroparesis due to DM ([place])    Essential hypertension    Hyperlipidemia    Type 2 diabetes mellitus with hypoglycemia ([place]) 06/26/2012   PCP:  [name], MD Pharmacy:   [place], [place] - [address] [place] [address] Phone: [phone] Fax: [phone]     Social Determinants of Health (SDOH) Interventions    Readmission Risk Interventions    04/06/2021  1:01 PM 02/19/2019    1:04 PM  Readmission Risk Prevention Plan  Transportation Screening Complete Complete  PCP or Specialist Appt within 5-7 Days  Complete  Home Care Screening  Complete  Medication Review (RN CM)  Complete  Medication Review ([place]) Complete   PCP or Specialist appointment within 3-5 days of discharge Complete   HRI or Home Care Consult Complete   SW Recovery Care/Counseling Consult Complete   [place] Complete

## 2021-11-06 NOTE — Progress Notes (Signed)
Pt receives out-pt HD at [facility] on MWF. Pt arrives at 11:30 for 11:45 chair time. Will assist as needed.   Melven Sartorius Renal Navigator [phone]

## 2021-11-06 NOTE — Progress Notes (Signed)
PROGRESS NOTE    Christopher Davila  ELF:[id number] DOB: 10/11/1970 DOA: 11/05/2021 PCP: [name], MD   Brief Narrative: Christopher Davila is a 51 y.o. male with medical history significant of diabetes, gastroparesis, hypertension, hyperlipidemia, anemia, ESRD on HD, CHF, blindness presenting from dialysis center with hypotension initially, now with weakness.   Assessment and Plan:  Generalized weakness No obvious etiology. No evidence of infection. Patient does have some fluid overload which may be contributing to deconditioning. PT/OT consulted and recommend PT at SNF.  Chronic diastolic heart failure Grade 2 diastolic dysfunction. Patient with volume overload after some IV fluids requiring HD for management. BNP elevated. No overall evidence of acute heart failure. -Defer resumption of torsemide to nephrology  Diabetes mellitus, type 2 -Continue SSI  Primary hypertension -Continue amlodipine, losartan and hydralazine  Hyperlipidemia -Continue Lipitor  ESRD on HD Nephrology consulted for HD.  Chronic pain -Continue  Percocet PRN  Blindness Chronic. Stable.  Left foot ulcer No infection. Recommend watching.   DVT prophylaxis: Heparin subq Code Status:   Code Status: Full Code Family Communication: None at bedside Disposition Plan: Likely discharge back to SNF in 24 hours   Consultants:  Nephrology  Procedures:  None  Antimicrobials: None    Subjective: Patient reports no issues overnight. Feels generally weak. States he was told he had an ulcer on his foot.  Objective: BP (!) 171/95 (BP Location: Left Arm)   Pulse 73   Temp 98.2 F (36.8 C)   Resp 18   Ht 5\' 3"  (1.6 m)   Wt 55.2 kg   SpO2 100%   BMI 21.56 kg/m   Examination:  General exam: Appears calm and comfortable Respiratory system: Clear to auscultation. Respiratory effort normal. Cardiovascular system: S1 & S2 heard, RRR. Gastrointestinal system: Abdomen is nondistended, soft and  nontender. Normal bowel sounds heard. Central nervous system: Alert and oriented. Musculoskeletal: No edema. No calf tenderness Skin: No cyanosis. No rashes. Left lateral foot with small ulcerated lesions that is healed. Psychiatry: Judgement and insight appear normal. Mood & affect appropriate.    Data Reviewed: I have personally reviewed following labs and imaging studies  CBC Lab Results  Component Value Date   WBC 7.6 11/06/2021   RBC 3.33 (L) 11/06/2021   HGB 9.5 (L) 11/06/2021   HCT 30.1 (L) 11/06/2021   MCV 90.4 11/06/2021   MCH 28.5 11/06/2021   PLT 177 11/06/2021   MCHC 31.6 11/06/2021   RDW 17.6 (H) 11/06/2021   LYMPHSABS 1.2 05/10/2021   MONOABS 1.3 (H) 05/10/2021   EOSABS 0.1 05/10/2021   BASOSABS 0.1 58/52/7782     Last metabolic panel Lab Results  Component Value Date   NA 136 11/06/2021   K 3.8 11/06/2021   CL 97 (L) 11/06/2021   CO2 26 11/06/2021   BUN 39 (H) 11/06/2021   CREATININE 6.16 (H) 11/06/2021   GLUCOSE 90 11/06/2021   GFRNONAA 10 (L) 11/06/2021   GFRAA 27 (L) 11/19/2019   CALCIUM 8.8 (L) 11/06/2021   PHOS 3.5 05/07/2021   PROT 7.2 11/06/2021   ALBUMIN 3.2 (L) 11/06/2021   BILITOT 0.6 11/06/2021   ALKPHOS 80 11/06/2021   AST 16 11/06/2021   ALT 15 11/06/2021   ANIONGAP 13 11/06/2021    GFR: Estimated Creatinine Clearance: 11.3 mL/min (A) (by C-G formula based on SCr of 6.16 mg/dL (H)).  Recent Results (from the past 240 hour(s))  Resp Panel by RT-PCR (Flu A&B, Covid) Nasopharyngeal Swab     Status:  None   Collection Time: 11/05/21  6:31 PM   Specimen: Nasopharyngeal Swab; Nasopharyngeal(NP) swabs in vial transport medium  Result Value Ref Range Status   SARS Coronavirus 2 by RT PCR NEGATIVE NEGATIVE Final    Comment: (NOTE) SARS-CoV-2 target nucleic acids are NOT DETECTED.  The SARS-CoV-2 RNA is generally detectable in upper respiratory specimens during the acute phase of infection. The lowest concentration of SARS-CoV-2 viral  copies this assay can detect is 138 copies/mL. A negative result does not preclude SARS-Cov-2 infection and should not be used as the sole basis for treatment or other patient management decisions. A negative result may occur with  improper specimen collection/handling, submission of specimen other than nasopharyngeal swab, presence of viral mutation(s) within the areas targeted by this assay, and inadequate number of viral copies(<138 copies/mL). A negative result must be combined with clinical observations, patient history, and epidemiological information. The expected result is Negative.  Fact Sheet for Patients:  EntrepreneurPulse.com.au  Fact Sheet for Healthcare Providers:  IncredibleEmployment.be  This test is no t yet approved or cleared by the [place] FDA and  has been authorized for detection and/or diagnosis of SARS-CoV-2 by FDA under an Emergency Use Authorization (EUA). This EUA will remain  in effect (meaning this test can be used) for the duration of the COVID-19 declaration under Section 564(b)(1) of the Act, 21 U.S.C.section 360bbb-3(b)(1), unless the authorization is terminated  or revoked sooner.       Influenza A by PCR NEGATIVE NEGATIVE Final   Influenza B by PCR NEGATIVE NEGATIVE Final    Comment: (NOTE) The Xpert Xpress SARS-CoV-2/FLU/RSV plus assay is intended as an aid in the diagnosis of influenza from Nasopharyngeal swab specimens and should not be used as a sole basis for treatment. Nasal washings and aspirates are unacceptable for Xpert Xpress SARS-CoV-2/FLU/RSV testing.  Fact Sheet for Patients: EntrepreneurPulse.com.au  Fact Sheet for Healthcare Providers: IncredibleEmployment.be  This test is not yet approved or cleared by the [place] FDA and has been authorized for detection and/or diagnosis of SARS-CoV-2 by FDA under an Emergency Use Authorization (EUA). This  EUA will remain in effect (meaning this test can be used) for the duration of the COVID-19 declaration under Section 564(b)(1) of the Act, 21 U.S.C. section 360bbb-3(b)(1), unless the authorization is terminated or revoked.  Performed at [facility], [place] [address], [place], [place] [address]       Radiology Studies: DG Chest 2 View  Result Date: 11/05/2021 CLINICAL DATA:  Weakness. Additional history provided: Weakness and joint pain, nausea/vomiting. Dialysis patient. EXAM: CHEST - 2 VIEW COMPARISON:  Prior chest radiographs 05/06/2021 and earlier. FINDINGS: Cardiomegaly. Central pulmonary vascular congestion with interstitial edema. Bilateral pleural effusions (small to moderate right, small left). No evidence of the thorax. No acute bony abnormality identified. Surgical clips within the right axilla. IMPRESSION: Cardiomegaly. Central pulmonary vascular congestion with interstitial edema. Bilateral pleural effusions (small to moderate right, small left). Electronically Signed   By: Kellie Simmering D.O.   On: 11/05/2021 18:37   CT Head Wo Contrast  Result Date: 11/05/2021 CLINICAL DATA:  Weakness and hypertension. EXAM: CT HEAD WITHOUT CONTRAST TECHNIQUE: Contiguous axial images were obtained from the base of the skull through the vertex without intravenous contrast. RADIATION DOSE REDUCTION: This exam was performed according to the departmental dose-optimization program which includes automated exposure control, adjustment of the mA and/or kV according to patient size and/or use of iterative reconstruction technique. COMPARISON:  MRI brain dated May 07, 2021. CT head dated August 02, 2020. FINDINGS: Brain: No evidence of acute infarction, hemorrhage, hydrocephalus, extra-axial collection or mass lesion/mass effect. Stable age advanced mild cerebral atrophy and moderate chronic microvascular ischemic changes. Vascular: Atherosclerotic vascular calcification of the carotid siphons. No  hyperdense vessel. Skull: Normal. Negative for fracture or focal lesion. Sinuses/Orbits: No acute finding. Chronic bilateral vitreous hemorrhage and progressive right phthisis bulbi. Other: None. IMPRESSION: 1. No acute intracranial abnormality. 2. Stable age advanced mild cerebral atrophy and moderate chronic microvascular ischemic changes. Electronically Signed   By: Titus Dubin M.D.   On: 11/05/2021 18:00      LOS: 0 days    Cordelia Poche, MD [facility] 11/06/2021, 7:28 AM   If 7PM-7AM, please contact night-coverage [email or web address]

## 2021-11-06 NOTE — Consult Note (Addendum)
Renal Service Consult Note Christopher Davila  Christopher Davila 11/06/2021 Sol Blazing, MD Requesting Physician: Dr. Lonny Prude  Reason for Consult: ESRD pt w/ gen'd weakness and falls HPI: The patient is a 51 y.o. year-old w/ hx of DM2, CHF, blindness, ESRD on HD, HTN who presented to ED w/ weakness and falls. Pt stays in a SNF. In ED BP's were good, HR/ RR wnl. Na 134, creat 5.1, Hb 10.7. Trops were slightly up and flat. CXR showed vasc congestion and possible early IS edema and effusions. Admitted for chronic weakness likely due to deconditioning and some fluid overload by CXR. TSH and B12 were sent off. We are asked to see for ESRD.    Pt seen in room. Pleasant, but tired. Vague historian. Have lived at [facility] for about 1 year. He is there "long-term". No etoh / tobacco.   ROS - denies CP, no joint pain, no HA, no blurry vision, no rash, no diarrhea, no nausea/ vomiting, no dysuria, no difficulty voiding   Past Medical History  Past Medical History:  Diagnosis Date   Acute on chronic diastolic CHF (congestive heart failure) ([place]) 09/27/2019   Acute respiratory failure with hypoxia ([place]) 03/26/2021   Acute urinary retention 10/13/2019   Anemia    Blindness 09/04/2019   CHF (congestive heart failure) ([place])    COVID-19    Diabetes mellitus    Diarrhea 10/14/2019   Epigastric abdominal pain    ESRD (end stage renal disease) ([place])    Gastroparesis due to DM ([place])    Hypertension    Hypertensive urgency 04/17/2019   Hypervolemia    Hypoalbuminemia 04/17/2019   Hypoxia    Metabolic acidosis, increased anion gap 10/13/2019   Multifocal pneumonia 05/06/2021   Neuropathy of lower extremity    Oral candidiasis 06/26/2012   Palliative care encounter    Pancreatitis 06/26/2012   Weight loss 06/26/2012   Past Surgical History  Past Surgical History:  Procedure Laterality Date   AV FISTULA PLACEMENT Right 10/29/2019   Procedure: RIGHT ARM BRACHIOBASILIC ARTERIOVENOUS (AV)  FISTULA CREATION;  Surgeon: Rosetta Posner, MD;  Location: [facility] OR;  Service: Vascular;  Laterality: Right;   [place] Right 01/05/2020   Procedure: RIGHT ARM SECOND STAGE [place];  Surgeon: Rosetta Posner, MD;  Location: [facility] OR;  Service: Vascular;  Laterality: Right;   EYE SURGERY     IR FLUORO GUIDE CV LINE RIGHT  10/28/2019   IR US GUIDE VASC ACCESS RIGHT  10/28/2019   Family History  Family History  Problem Relation Age of Onset   Diabetes Mother    Lung disease Mother    Hypertension Mother    Diabetes Maternal Aunt    CAD Maternal Aunt    CAD Cousin    Social History  reports that he quit smoking about 6 years ago. His smoking use included cigarettes. He has never used smokeless tobacco. He reports that he does not currently use alcohol. He reports that he does not use drugs. Allergies  Allergies  Allergen Reactions   Morphine And Related Itching and Other (See Comments)    Pt prefers not to be given this drug   Home medications Prior to Admission medications   Medication Sig Start Date End Date Taking? Authorizing Provider  albuterol (VENTOLIN HFA) 108 (90 Base) MCG/ACT inhaler Inhale 2 puffs into the lungs every 4 (four) hours as needed for shortness of breath.   Yes [provider]  amLODipine (NORVASC) 2.5  MG tablet Take 2.5 mg by mouth daily.   Yes [provider]  aspirin EC 81 MG tablet Take 1 tablet (81 mg total) by mouth daily. 05/24/15  Yes Ghimire, Henreitta Leber, MD  atorvastatin (LIPITOR) 40 MG tablet Take 1 tablet (40 mg total) by mouth daily. 12/05/20  Yes [place]ique, Peter M, MD  Atropine Sulfate 0.01 % SOLN Place 1 drop into both eyes in the morning and at bedtime. 04/24/21  Yes [provider]  brimonidine (ALPHAGAN P) 0.1 % SOLN Place 1 drop into the left eye in the morning and at bedtime.   Yes [provider]  calcium acetate (PHOSLO) 667 MG capsule Take 1 capsule (667 mg total) by mouth 3 (three)  times daily with meals. Patient taking differently: Take 1,334 mg by mouth 3 (three) times daily with meals. 11/19/19  Yes Charlynne Cousins, MD  capsaicin (ZOSTRIX) 0.025 % cream Apply 1 application. topically every 8 (eight) hours as needed (pain).   Yes [provider]  Dextran 70-Hypromellose (TEARS PURE) 0.1-0.3 % SOLN Place 1 drop into both eyes in the morning, at noon, in the evening, and at bedtime.   Yes [provider]  dorzolamide-timolol (COSOPT) 22.3-6.8 MG/ML ophthalmic solution Place 1 drop into the left eye 2 (two) times daily. 09/23/19  Yes Vann, Jessica U, DO  guaiFENesin (ROBITUSSIN) 100 MG/5ML liquid Take 100 mg by mouth every 4 (four) hours as needed for cough.   Yes [provider]  hydrALAZINE (APRESOLINE) 25 MG tablet Take 37.5 mg by mouth 3 (three) times daily.   Yes [provider]  ipratropium-albuterol (DUONEB) 0.5-2.5 (3) MG/3ML SOLN Take 3 mLs by nebulization every 6 (six) hours as needed (shortness of breath or wheezing).   Yes [provider]  latanoprost (XALATAN) 0.005 % ophthalmic solution Place 1 drop into the left eye at bedtime. 09/23/19  Yes Vann, Jessica U, DO  losartan (COZAAR) 100 MG tablet Take 1 tablet (100 mg total) by mouth daily. 11/19/19  Yes Charlynne Cousins, MD  Melatonin 10 MG CAPS Take 10 mg by mouth at bedtime.   Yes [provider]  Menthol, Topical Analgesic, 5 % GEL Apply 1 application. topically every 8 (eight) hours as needed (hand/wrist pain).   Yes [provider]  Nutritional Supplements (RESOURCE 2.0 PO) Take 120 mLs by mouth in the morning and at bedtime.   Yes [provider]  olopatadine (PATANOL) 0.1 % ophthalmic solution Place 2 drops into both eyes daily.   Yes [provider]  ondansetron (ZOFRAN) 4 MG tablet Take 4 mg by mouth every 6 (six) hours as needed for nausea or vomiting.   Yes [provider]  oxyCODONE-acetaminophen (PERCOCET/ROXICET)  5-325 MG tablet Take 1 tablet by mouth See admin instructions. 1 tablet once daily every Tuesday, Thursday, Saturday, and Sunday. May take an additional 1 tablet every 6 hours as needed on Monday's Wednesday's, and Friday's. 05/10/21  Yes Nita Sells, MD  polyethylene glycol (MIRALAX / GLYCOLAX) 17 g packet Take 17 g by mouth See admin instructions. Every three days.  May take an additional 17g daily as needed for constipation.   Yes [provider]  pramoxine-zinc acetate (CALADRYL) 1-0.1 % LOTN Apply 1 application. topically every 8 (eight) hours as needed (back, torso, arms, and legs for puritus).   Yes [provider]  prednisoLONE acetate (PRED FORTE) 1 % ophthalmic suspension Place 1 drop into both eyes 4 (four) times daily.   Yes [provider]  pregabalin (LYRICA) 25 MG capsule Take 1 capsule (25 mg total) by mouth 2 (two) times daily. 05/10/21  Yes Nita Sells, MD  senna-docusate (SENOKOT-S) 8.6-50 MG tablet Take 2 tablets by mouth 2 (two) times daily.   Yes [provider]  torsemide (DEMADEX) 100 MG tablet Take 100 mg by mouth 2 (two) times daily.   Yes [provider]  vitamin B-12 1000 MCG tablet Take 1 tablet (1,000 mcg total) by mouth daily. 05/11/21  Yes Nita Sells, MD  Vitamin D, Ergocalciferol, (DRISDOL) 1.25 MG (50000 UNIT) CAPS capsule Take 50,000 Units by mouth every Sunday.   Yes [provider]  amLODipine (NORVASC) 10 MG tablet Take 0.5 tablets (5 mg total) by mouth daily. Patient not taking: Reported on 11/06/2021 05/10/21   Nita Sells, MD  ferrous sulfate 325 (65 FE) MG tablet Take 325 mg by mouth daily. Patient not taking: Reported on 11/06/2021 08/25/20   [provider]  OXYGEN Inhale 3 L into the lungs continuous.    [provider]  sertraline (ZOLOFT) 25 MG tablet Take 1 tablet (25 mg total) by mouth daily. On Monday's, Wednesday's, and Friday's, take after  dialysis. All other days take in the morning. Patient not taking: Reported on 11/06/2021 05/10/21   Nita Sells, MD     Vitals:   11/05/21 2231 11/06/21 0500 11/06/21 0509 11/06/21 0931  BP:   (!) 171/95 (!) 172/97  Pulse:   73 86  Resp:   18 18  Temp:   98.2 F (36.8 C) 98.8 F (37.1 C)  TempSrc:    Oral  SpO2:   100% 100%  Weight: 55.2 kg 55.2 kg    Height: 5\' 3"  (1.6 m)      Exam Gen alert, no distress, chron ill appearing and somewhat frail No rash, cyanosis or gangrene Sclera anicteric, throat clear  No jvd or bruits Chest clear bilat to bases, no rales/ wheezing RRR no RG Abd soft ntnd no mass or ascites +bs GU normal male MS no joint effusions or deformity Ext no sig LE edema Neuro is tired, moves all ext    RUA AVF+bruit   Home meds include - albuterol, amlodipine, aspirin, atrovastatin, phoslo 2 ac, eyedrops, ferrous sulfate, robitussin, hydralazine 37.5 tid, zofran, percocet, O2 3L, miralax, vit B -12, ergocalciferol    OP HD: [facility]  4h  400/600   2/2 bath 54kg  Hep 3000  RUA AVF - venofer 50 wqk - mircera 100 q2 last 120 on 5/05 - bid wt gains   Assessment/ Plan: Generalized weakness/ falls - per pmd this is suspected due to deconditioning. W/u in progress for other causes.  ESRD - on HD MWF. HD tomorrow.  BP/ vol - BP's on the high side. Continues on hydralazine and norvasc as OP. Get OP records/ dry wt. UF 2.5 L w HD tomorrow.  Anemia esrd - Hb 9- 11 here. Due for esa, ordering darbe 100 here weekly on wed.  BMD ckd - CCa in range, will add on phos. Cont binders Blindness  Chronic pain      Rob Harlyn Italiano  MD 11/06/2021, 2:26 PM Recent Labs  Lab 11/05/21 1633 11/05/21 1725 11/05/21 2306 11/06/21 0419  HGB  --    < > 11.8* 9.5*  ALBUMIN 3.8  --   --  3.2*  CALCIUM 8.9  --   --  8.8*  CREATININE 5.12*  --   --  6.16*  K 4.1  --   --  3.8   < > = values in this interval not displayed.

## 2021-11-06 NOTE — Plan of Care (Signed)
  Problem: Health Behavior/Discharge Planning: Goal: Ability to manage health-related needs will improve Outcome: Progressing   

## 2021-11-07 DIAGNOSIS — R531 Weakness: Secondary | ICD-10-CM | POA: Diagnosis not present

## 2021-11-07 LAB — RENAL FUNCTION PANEL
Albumin: 3.7 g/dL (ref 3.5–5.0)
Anion gap: 16 — ABNORMAL HIGH (ref 5–15)
BUN: 51 mg/dL — ABNORMAL HIGH (ref 6–20)
CO2: 27 mmol/L (ref 22–32)
Calcium: 9.1 mg/dL (ref 8.9–10.3)
Chloride: 92 mmol/L — ABNORMAL LOW (ref 98–111)
Creatinine, Ser: 7.74 mg/dL — ABNORMAL HIGH (ref 0.61–1.24)
GFR, Estimated: 8 mL/min — ABNORMAL LOW (ref >60)
Glucose, Bld: 97 mg/dL (ref 70–99)
Phosphorus: 6 mg/dL — ABNORMAL HIGH (ref 2.5–4.6)
Potassium: 4.4 mmol/L (ref 3.5–5.1)
Sodium: 135 mmol/L (ref 135–145)

## 2021-11-07 LAB — CBC
HCT: 33.4 % — ABNORMAL LOW (ref 39.0–52.0)
Hemoglobin: 10.9 g/dL — ABNORMAL LOW (ref 13.0–17.0)
MCH: 29.1 pg (ref 26.0–34.0)
MCHC: 32.6 g/dL (ref 30.0–36.0)
MCV: 89.1 fL (ref 80.0–100.0)
Platelets: 202 10*3/uL (ref 150–400)
RBC: 3.75 MIL/uL — ABNORMAL LOW (ref 4.22–5.81)
RDW: 17.2 % — ABNORMAL HIGH (ref 11.5–15.5)
WBC: 8.4 10*3/uL (ref 4.0–10.5)
nRBC: 0 % (ref 0.0–0.2)

## 2021-11-07 LAB — GLUCOSE, CAPILLARY
Glucose-Capillary: 91 mg/dL (ref 70–99)
Glucose-Capillary: 101 mg/dL — ABNORMAL HIGH (ref 70–99)
Glucose-Capillary: 156 mg/dL — ABNORMAL HIGH (ref 70–99)
Glucose-Capillary: 175 mg/dL — ABNORMAL HIGH (ref 70–99)

## 2021-11-07 MED ORDER — MUPIROCIN CALCIUM 2 % EX CREA
TOPICAL_CREAM | Freq: Every day | CUTANEOUS | Status: DC
  Filled 2021-11-07: qty 15

## 2021-11-07 MED ORDER — PREGABALIN 25 MG PO CAPS: 25.0000 mg | ORAL_CAPSULE | Freq: Every day | ORAL | 0 refills | Status: DC

## 2021-11-07 MED ORDER — OXYCODONE-ACETAMINOPHEN 5-325 MG PO TABS: 1.0000 | ORAL_TABLET | ORAL | 0 refills | Status: DC

## 2021-11-07 NOTE — Progress Notes (Signed)
Nephrology Follow-Up Consult note   Assessment/Recommendations: Christopher Davila is a/an 51 y.o. male with a past medical history significant for ESRD, admitted for weakness/falls.      OP HD: [facility]  4h  400/600   2/2 bath 54kg  Hep 3000  RUA AVF - venofer 50 wqk - mircera 100 q2 last 120 on 5/05 - bid wt gains     Assessment/ Plan: Generalized weakness/ falls -likely related to deconditioning.  Management per primary team ESRD - on HD MWF.  Continue dialysis today per schedule BP/ vol -blood pressure on the higher side.  Ultrafiltration with dialysis today.  Continue home medications.  Adjust as needed Anemia esrd - Hb 10.9. Due for ESA today  BMD ckd - CCa in range, Phos at 6. Cont binders Blindness  Chronic pain   Recommendations conveyed to primary service.    [facility] 11/07/2021 10:03 AM  ___________________________________________________________  CC: Weakness/Falls  Interval History/Subjective: Patient was seen on dialysis today.  Tolerating well.  Denies any issues so far.   Medications:  Current Facility-Administered Medications  Medication Dose Route Frequency Provider Last Rate Last Admin   acetaminophen (TYLENOL) tablet 650 mg  650 mg Oral Q6H PRN Marcelyn Bruins, MD       Or   acetaminophen (TYLENOL) suppository 650 mg  650 mg Rectal Q6H PRN Marcelyn Bruins, MD       amLODipine (NORVASC) tablet 5 mg  5 mg Oral Daily Marcelyn Bruins, MD   5 mg at 11/06/21 1134   atorvastatin (LIPITOR) tablet 40 mg  40 mg Oral Daily Marcelyn Bruins, MD   40 mg at 11/06/21 1134   calcium acetate (PHOSLO) capsule 667 mg  667 mg Oral TID WC Marcelyn Bruins, MD   667 mg at 11/06/21 1657   Chlorhexidine Gluconate Cloth 2 % PADS 6 each  6 each Topical Q0600 Roney Jaffe, MD   6 each at 11/07/21 0456   Darbepoetin Alfa (ARANESP) injection 100 mcg  100 mcg Intravenous Q Wed-HD Roney Jaffe, MD       diphenhydrAMINE  (BENADRYL) capsule 25 mg  25 mg Oral Q8H PRN Irene Pap N, DO   25 mg at 11/07/21 0910   dorzolamide-timolol (COSOPT) 22.3-6.8 MG/ML ophthalmic solution 1 drop  1 drop Left Eye BID Marcelyn Bruins, MD   1 drop at 11/06/21 2209   ferrous sulfate tablet 325 mg  325 mg Oral Daily Marcelyn Bruins, MD   325 mg at 11/06/21 1134   heparin injection 5,000 Units  5,000 Units Subcutaneous Q8H Marcelyn Bruins, MD   5,000 Units at 11/07/21 0456   hydrALAZINE (APRESOLINE) tablet 25 mg  25 mg Oral BID Marcelyn Bruins, MD   25 mg at 11/06/21 2205   insulin aspart (novoLOG) injection 0-6 Units  0-6 Units Subcutaneous TID WC Marcelyn Bruins, MD   1 Units at 11/06/21 1215   latanoprost (XALATAN) 0.005 % ophthalmic solution 1 drop  1 drop Left Eye QHS Marcelyn Bruins, MD   1 drop at 11/06/21 2211   lip balm (CARMEX) ointment   Topical PRN Marcelyn Bruins, MD   Given at 11/06/21 2336   olopatadine (PATANOL) 0.1 % ophthalmic solution 2 drop  2 drop Both Eyes Daily Marcelyn Bruins, MD   2 drop at 11/06/21 1132   oxyCODONE-acetaminophen (PERCOCET/ROXICET) 5-325 MG per tablet 1 tablet  1 tablet Oral Q6H PRN Marcelyn Bruins, MD  1 tablet at 11/07/21 0450   polyethylene glycol (MIRALAX / GLYCOLAX) packet 17 g  17 g Oral Daily PRN Marcelyn Bruins, MD       polyvinyl alcohol (LIQUIFILM TEARS) 1.4 % ophthalmic solution 1 drop  1 drop Both Eyes TID Marcelyn Bruins, MD   1 drop at 11/06/21 2212   prednisoLONE acetate (PRED FORTE) 1 % ophthalmic suspension 1 drop  1 drop Both Eyes TID AC & HS Marcelyn Bruins, MD   1 drop at 11/06/21 2212   sodium chloride flush (NS) 0.9 % injection 3 mL  3 mL Intravenous Q12H Marcelyn Bruins, MD   3 mL at 11/06/21 2213   vitamin B-12 (CYANOCOBALAMIN) tablet 1,000 mcg  1,000 mcg Oral Daily Marcelyn Bruins, MD   1,000 mcg at 11/06/21 1134      Review of Systems: 10 systems reviewed and negative except per interval  history/subjective  Physical Exam: Vitals:   11/07/21 0900 11/07/21 0930  BP: (!) 171/101 (!) 151/99  Pulse: 81 82  Resp:    Temp:    SpO2:     No intake/output data recorded.  Intake/Output Summary (Last 24 hours) at 11/07/2021 1003 Last data filed at 11/07/2021 0700 Gross per 24 hour  Intake 940 ml  Output 250 ml  Net 690 ml   Constitutional: well-appearing, no acute distress ENMT: ears and nose without scars or lesions, MMM CV: normal rate, no edema Respiratory: Bilateral chest rise, normal work of breathing Gastrointestinal: soft, non-tender, no palpable masses or hernias Skin: no visible lesions or rashes Psych: alert, judgement/insight appropriate, appropriate mood and affect   Test Results I personally reviewed new and old clinical labs and radiology tests Lab Results  Component Value Date   NA 135 11/07/2021   K 4.4 11/07/2021   CL 92 (L) 11/07/2021   CO2 27 11/07/2021   BUN 51 (H) 11/07/2021   CREATININE 7.74 (H) 11/07/2021   CALCIUM 9.1 11/07/2021   ALBUMIN 3.7 11/07/2021   PHOS 6.0 (H) 11/07/2021    CBC Recent Labs  Lab 11/05/21 2306 11/06/21 0419 11/07/21 0636  WBC 8.4 7.6 8.4  HGB 11.8* 9.5* 10.9*  HCT 37.6* 30.1* 33.4*  MCV 89.3 90.4 89.1  PLT 206 177 202

## 2021-11-07 NOTE — Care Management Obs Status (Signed)
[place] NOTIFICATION   Patient Details  Name: Ambers Iyengar MRN: [id number] Date of Birth: February 22, 1971   Medicare Observation Status Notification Given:  Yes    Tom-Mayhall, Renea Ee, RN 11/07/2021, 9:05 AM

## 2021-11-07 NOTE — Consult Note (Addendum)
[place] Nurse Consult Note: Reason for Consult: Consult requested for bilat feet.  Heels are dry and calloused. Right anterior foot with healing full thickness abrasion; .5X.5X.2cm, red and dry Left plantar anterior foot with full thickness wound; 1.2X1.2X.2cm, 50% red and dry, 50% yellow. Dressing procedure/placement/frequency: Topical treatment orders provided for bedside nurses to perform as follows to promote moist healing: Apply Bactroban to left and right foot wounds Q day, then cover with foam dressings.  (Change foam dressings Q 3 days or PRN soiling.) Please re-consult if further assistance is needed.  Thank-you,  Julien Girt MSN, [place], [place], [place], [place]

## 2021-11-07 NOTE — TOC Transition Note (Signed)
Transition of Care [facility]) - CM/SW Discharge Note   Patient Details  Name: Jaaziel Peatross MRN: [id number] Date of Birth: [birth year]  Transition of Care [facility]) CM/SW Contact:  Milinda Antis, [place] Phone Number: 11/07/2021, 3:53 PM   Clinical Narrative:    Patient will DC to: [facility] SNF Anticipated DC date: 11/07/2021 Transport by:  Corey Harold   Per MD patient ready for DC to SNF. RN to call report prior to discharge [phone] ask for 400 hall RN. RN, patient, and facility notified of DC. Discharge Summary and FL2 sent to facility. DC packet on chart. Ambulance transport requested for patient.   CSW will sign off for now as social work intervention is no longer needed. Please consult [place] again if new needs arise.     Final next level of care: Skilled Nursing Facility Barriers to Discharge: Barriers Resolved   Patient Goals and CMS Choice Patient states their goals for this hospitalization and ongoing recovery are:: To get back to the facility      Discharge Placement              Patient chooses bed at:  [name]) Patient to be transferred to facility by: [place] Name of family member notified: Patient alert and oriented. Patient and family notified of of transfer: 11/07/21  Discharge Plan and Services                                     Social Determinants of Health (SDOH) Interventions     Readmission Risk Interventions    04/06/2021    1:01 PM 02/19/2019    1:04 PM  Readmission Risk Prevention Plan  Transportation Screening Complete Complete  PCP or Specialist Appt within 5-7 Days  Complete  Home Care Screening  Complete  Medication Review (RN CM)  Complete  Medication Review (RN Care Manager) Complete   PCP or Specialist appointment within 3-5 days of discharge Complete   HRI or Home Care Consult Complete   SW Recovery Care/Counseling Consult Complete   Palliative Care Screening Not [place] Complete

## 2021-11-07 NOTE — Plan of Care (Signed)
  Problem: Clinical Measurements: Goal: Complications related to the disease process or treatment will be avoided or minimized Outcome: Not Applicable

## 2021-11-07 NOTE — TOC Progression Note (Addendum)
Transition of Care [facility]) - Initial/Assessment Note    Patient Details  Name: Christopher Davila MRN: [id number] Date of Birth: [birth year]  Transition of Care [facility]) CM/SW Contact:    Milinda Antis, [place] Phone Number: 11/07/2021, 9:51 AM  Clinical Narrative:                 09:45- CSW contacted the facility to inquire about whether the patient can return today after dialysis and is awaiting a response.   28-  CSW informed that the patien tcan return to the facility today.  MD notified.   Expected Discharge Plan: [place] Barriers to Discharge: Continued Medical Work up   Patient Goals and CMS Choice        Expected Discharge Plan and Services Expected Discharge Plan: [place]       Living arrangements for the past 2 months: [place]                                      Prior Living Arrangements/Services Living arrangements for the past 2 months: [place] Lives with:: Facility Resident Patient language and need for interpreter reviewed:: Yes        Need for Family Participation in Patient Care: No (Comment) Care giver support system in place?: Yes (comment)   Criminal Activity/Legal Involvement Pertinent to Current Situation/Hospitalization: No - Comment as needed  Activities of Daily Living Home Assistive Devices/Equipment: Gilford Rile (specify type) ADL Screening (condition at time of admission) Patient's cognitive ability adequate to safely complete daily activities?: Yes Is the patient deaf or have difficulty hearing?: No Does the patient have difficulty seeing, even when wearing glasses/contacts?: Yes Does the patient have difficulty concentrating, remembering, or making decisions?: No Patient able to express need for assistance with ADLs?: Yes Does the patient have difficulty dressing or bathing?: No Independently performs ADLs?: Yes (appropriate for developmental age) Does the patient have  difficulty walking or climbing stairs?: Yes Weakness of Legs: Both Weakness of Arms/Hands: None  Permission Sought/Granted                  Emotional Assessment              Admission diagnosis:  Generalized weakness [R53.1] Nausea and vomiting, unspecified vomiting type [R11.2] Patient Active Problem List   Diagnosis Date Noted   Generalized weakness 11/05/2021   Pain due to onychomycosis of toenails of both feet 07/31/2021   Skin lesion 07/31/2021   End-stage renal disease on hemodialysis [facility])    History of thoracentesis    Pressure injury of skin 11/06/2019   ESRD on dialysis ([place])    Heart failure with preserved ejection fraction ([place])    Goals of care, counseling/discussion    Weakness 10/05/2019   Vision loss of left eye 09/21/2019   Homeless 09/19/2019   Anemia of chronic disease 09/19/2019   Vision loss, left eye 09/19/2019   Acute loss of vision, right 06/16/2019   Alcohol use    Gastroparesis due to DM ([place])    Essential hypertension    Hyperlipidemia    Type 2 diabetes mellitus with hypoglycemia ([place]) 06/26/2012   PCP:  [name], MD Pharmacy:   [place], [place] - [address] [place] [address] Phone: [phone] Fax: [phone]     Social Determinants of Health (SDOH) Interventions  Readmission Risk Interventions    04/06/2021    1:01 PM 02/19/2019    1:04 PM  Readmission Risk Prevention Plan  Transportation Screening Complete Complete  PCP or Specialist Appt within 5-7 Days  Complete  Home Care Screening  Complete  Medication Review (RN CM)  Complete  Medication Review ([place]) Complete   PCP or Specialist appointment within 3-5 days of discharge Complete   HRI or Home Care Consult Complete   SW Recovery Care/Counseling Consult Complete   [place] Complete

## 2021-11-07 NOTE — Plan of Care (Signed)
  Problem: Health Behavior/Discharge Planning: Goal: Ability to manage health-related needs will improve Outcome: Adequate for Discharge   Problem: Clinical Measurements: Goal: Ability to maintain clinical measurements within normal limits will improve Outcome: Adequate for Discharge Goal: Will remain free from infection Outcome: Adequate for Discharge Goal: Diagnostic test results will improve Outcome: Adequate for Discharge Goal: Respiratory complications will improve Outcome: Adequate for Discharge Goal: Cardiovascular complication will be avoided Outcome: Adequate for Discharge   Problem: Acute Rehab OT Goals (only OT should resolve) Goal: Pt. Will Perform Grooming Outcome: Adequate for Discharge Goal: Pt. Will Perform Upper Body Dressing Outcome: Adequate for Discharge Goal: Pt. Will Perform Lower Body Dressing Outcome: Adequate for Discharge Goal: Pt. Will Transfer To Toilet Outcome: Adequate for Discharge Goal: Pt. Will Perform Toileting-Clothing Manipulation Outcome: Adequate for Discharge Goal: Pt. Will Perform Tub/Shower Transfer Outcome: Adequate for Discharge Goal: OT Additional ADL Goal #1 Outcome: Adequate for Discharge   Problem: Acute Rehab PT Goals(only PT should resolve) Goal: Pt Will Go Supine/Side To Sit Outcome: Adequate for Discharge Goal: Patient Will Transfer Sit To/From Stand Outcome: Adequate for Discharge Goal: Pt Will Ambulate Outcome: Adequate for Discharge   Problem: Education: Goal: Knowledge of disease and its progression will improve Outcome: Adequate for Discharge Goal: Individualized Educational Video(s) Outcome: Adequate for Discharge   Problem: Fluid Volume: Goal: Compliance with measures to maintain balanced fluid volume will improve Outcome: Adequate for Discharge   Problem: Health Behavior/Discharge Planning: Goal: Ability to manage health-related needs will improve Outcome: Adequate for Discharge   Problem:  Nutritional: Goal: Ability to make healthy dietary choices will improve Outcome: Adequate for Discharge   Problem: Clinical Measurements: Goal: Complications related to the disease process, condition or treatment will be avoided or minimized Outcome: Adequate for Discharge

## 2021-11-07 NOTE — Procedures (Signed)
I was present at this dialysis session. I have reviewed the session itself and made appropriate changes.   Filed Weights   11/06/21 0500 11/07/21 0457 11/07/21 0835  Weight: 55.2 kg 54.7 kg 57.7 kg    Recent Labs  Lab 11/07/21 0636  NA 135  K 4.4  CL 92*  CO2 27  GLUCOSE 97  BUN 51*  CREATININE 7.74*  CALCIUM 9.1  PHOS 6.0*    Recent Labs  Lab 11/05/21 2306 11/06/21 0419 11/07/21 0636  WBC 8.4 7.6 8.4  HGB 11.8* 9.5* 10.9*  HCT 37.6* 30.1* 33.4*  MCV 89.3 90.4 89.1  PLT 206 177 202    Scheduled Meds:  amLODipine  5 mg Oral Daily   atorvastatin  40 mg Oral Daily   calcium acetate  667 mg Oral TID WC   Chlorhexidine Gluconate Cloth  6 each Topical Q0600   darbepoetin (ARANESP) injection - DIALYSIS  100 mcg Intravenous Q Wed-HD   dorzolamide-timolol  1 drop Left Eye BID   ferrous sulfate  325 mg Oral Daily   heparin  5,000 Units Subcutaneous Q8H   hydrALAZINE  25 mg Oral BID   insulin aspart  0-6 Units Subcutaneous TID WC   latanoprost  1 drop Left Eye QHS   olopatadine  2 drop Both Eyes Daily   polyvinyl alcohol  1 drop Both Eyes TID   prednisoLONE acetate  1 drop Both Eyes TID AC & HS   sodium chloride flush  3 mL Intravenous Q12H   cyanocobalamin  1,000 mcg Oral Daily   Continuous Infusions: PRN Meds:.acetaminophen **OR** acetaminophen, diphenhydrAMINE, lip balm, oxyCODONE-acetaminophen, polyethylene glycol   Santiago Bumpers,  MD 11/07/2021, 10:05 AM

## 2021-11-07 NOTE — Progress Notes (Signed)
Pt to d/c to snf today. Contacted [facility] to advise clinic of pt's d/c today and that pt will resume care on Friday.   Melven Sartorius Renal Navigator [phone]

## 2021-11-07 NOTE — Discharge Summary (Signed)
Physician Discharge Summary   [name] MRN: [id number] DOB: 03-16-1971  Admit date:     11/05/2021  Discharge date: 11/07/21  Discharge Physician: Cordelia Poche, MD   PCP: [name], MD   Recommendations at discharge:  PCP follow-up Continue hemodialysis  Discharge Diagnoses: Principal Problem:   Generalized weakness Active Problems:   Type 2 diabetes mellitus with hypoglycemia ([place])   Essential hypertension   Hyperlipidemia   Gastroparesis due to DM ([place])   Anemia of chronic disease   ESRD on dialysis ([place])   Heart failure with preserved ejection fraction ([place])   End-stage renal disease on hemodialysis ([place])  Resolved Problems:   * No resolved hospital problems. [facility] Course: Christopher Davila is a 51 y.o. male with medical history significant of diabetes, gastroparesis, hypertension, hyperlipidemia, anemia, ESRD on HD, CHF, blindness presenting from dialysis center with hypotension initially, now with weakness. PT/OT recommending PT at SNF. Lyrica decreased.  Assessment and Plan: Generalized weakness No obvious etiology. No evidence of infection. Patient does have some fluid overload which may be contributing to deconditioning. PT/OT consulted and recommend PT at SNF. Lyrica dose decreased in setting of ESRD in case this is contributing to presentation.   Chronic diastolic heart failure Grade 2 diastolic dysfunction. Patient with volume overload after some IV fluids requiring HD for management. BNP elevated. No overall evidence of acute heart failure. Resume torsemide on discharge.   Diabetes mellitus, type 2 -Continue SSI   Primary hypertension Continue amlodipine, losartan and hydralazine   Hyperlipidemia Continue Lipitor   ESRD on HD Nephrology consulted for HD.   Chronic pain Continue  Percocet PRN   Blindness Chronic. Stable.   Left foot ulcer Wound care consulted. Wound care recommendations (5/24): Apply Bactroban to left and  right foot wounds Q day, then cover with foam dressings. (Change foam dressings Q 3 days or PRN soiling.)      Consultants: Nephrology Procedures performed: Hemodialysis  Disposition: Skilled nursing facility Diet recommendation:  Renal diet  DISCHARGE MEDICATION: Allergies as of 11/07/2021       Reactions   Morphine And Related Itching, Other (See Comments)   Pt prefers not to be given this drug        Medication List     STOP taking these medications    sertraline 25 MG tablet Commonly known as: ZOLOFT       TAKE these medications    albuterol 108 (90 Base) MCG/ACT inhaler Commonly known as: VENTOLIN HFA Inhale 2 puffs into the lungs every 4 (four) hours as needed for shortness of breath.   amLODipine 10 MG tablet Commonly known as: NORVASC Take 0.5 tablets (5 mg total) by mouth daily. What changed: Another medication with the same name was removed. Continue taking this medication, and follow the directions you see here.   aspirin EC 81 MG tablet Take 1 tablet (81 mg total) by mouth daily.   atorvastatin 40 MG tablet Commonly known as: Lipitor Take 1 tablet (40 mg total) by mouth daily.   Atropine Sulfate 0.01 % Soln Place 1 drop into both eyes in the morning and at bedtime.   brimonidine 0.1 % Soln Commonly known as: ALPHAGAN P Place 1 drop into the left eye in the morning and at bedtime.   calcium acetate 667 MG capsule Commonly known as: PHOSLO Take 1 capsule (667 mg total) by mouth 3 (three) times daily with meals. What changed: how much to take   capsaicin 0.025 % cream  Commonly known as: ZOSTRIX Apply 1 application. topically every 8 (eight) hours as needed (pain).   cyanocobalamin 1000 MCG tablet Take 1 tablet (1,000 mcg total) by mouth daily.   dorzolamide-timolol 22.3-6.8 MG/ML ophthalmic solution Commonly known as: COSOPT Place 1 drop into the left eye 2 (two) times daily.   ferrous sulfate 325 (65 FE) MG tablet Take 325 mg by mouth  daily.   guaiFENesin 100 MG/5ML liquid Commonly known as: ROBITUSSIN Take 100 mg by mouth every 4 (four) hours as needed for cough.   hydrALAZINE 25 MG tablet Commonly known as: APRESOLINE Take 37.5 mg by mouth 3 (three) times daily.   ipratropium-albuterol 0.5-2.5 (3) MG/3ML Soln Commonly known as: DUONEB Take 3 mLs by nebulization every 6 (six) hours as needed (shortness of breath or wheezing).   latanoprost 0.005 % ophthalmic solution Commonly known as: XALATAN Place 1 drop into the left eye at bedtime.   losartan 100 MG tablet Commonly known as: COZAAR Take 1 tablet (100 mg total) by mouth daily.   Melatonin 10 MG Caps Take 10 mg by mouth at bedtime.   Menthol (Topical Analgesic) 5 % Gel Apply 1 application. topically every 8 (eight) hours as needed (hand/wrist pain).   olopatadine 0.1 % ophthalmic solution Commonly known as: PATANOL Place 2 drops into both eyes daily.   ondansetron 4 MG tablet Commonly known as: ZOFRAN Take 4 mg by mouth every 6 (six) hours as needed for nausea or vomiting.   oxyCODONE-acetaminophen 5-325 MG tablet Commonly known as: PERCOCET/ROXICET Take 1 tablet by mouth See admin instructions. 1 tablet once daily every Tuesday, Thursday, Saturday, and Sunday. May take an additional 1 tablet every 6 hours as needed on Monday's Wednesday's, and Friday's.   OXYGEN Inhale 3 L into the lungs continuous.   polyethylene glycol 17 g packet Commonly known as: MIRALAX / GLYCOLAX Take 17 g by mouth See admin instructions. Every three days.  May take an additional 17g daily as needed for constipation.   pramoxine-zinc acetate 1-0.1 % Lotn Commonly known as: CALADRYL Apply 1 application. topically every 8 (eight) hours as needed (back, torso, arms, and legs for puritus).   prednisoLONE acetate 1 % ophthalmic suspension Commonly known as: PRED FORTE Place 1 drop into both eyes 4 (four) times daily.   pregabalin 25 MG capsule Commonly known as:  LYRICA Take 1 capsule (25 mg total) by mouth 2 (two) times daily.   RESOURCE 2.0 PO Take 120 mLs by mouth in the morning and at bedtime.   senna-docusate 8.6-50 MG tablet Commonly known as: Senokot-S Take 2 tablets by mouth 2 (two) times daily.   Tears Pure 0.1-0.3 % Soln Generic drug: Dextran 70-Hypromellose Place 1 drop into both eyes in the morning, at noon, in the evening, and at bedtime.   torsemide 100 MG tablet Commonly known as: DEMADEX Take 100 mg by mouth 2 (two) times daily.   Vitamin D (Ergocalciferol) 1.25 MG (50000 UNIT) Caps capsule Commonly known as: DRISDOL Take 50,000 Units by mouth every Sunday.        Discharge Exam: BP (!) 152/94   Pulse 89   Temp 98.9 F (37.2 C) (Oral)   Resp 17   Ht 5\' 3"  (1.6 m)   Wt 55 kg   SpO2 100%   BMI 21.48 kg/m   General exam: Appears calm and comfortable Respiratory system: Clear to auscultation. Respiratory effort normal. Cardiovascular system: S1 & S2 heard, RRR.  Gastrointestinal system: Abdomen is nondistended, soft and nontender.  Normal bowel sounds heard. Central nervous system: Alert and oriented. No focal neurological deficits. Musculoskeletal: No edema. No calf tenderness Skin: No cyanosis. No rashes Psychiatry: Judgement and insight appear normal. Mood & affect appropriate.   Condition at discharge: stable  The results of significant diagnostics from this hospitalization (including imaging, microbiology, ancillary and laboratory) are listed below for reference.   Imaging Studies: DG Chest 2 View  Result Date: 11/05/2021 CLINICAL DATA:  Weakness. Additional history provided: Weakness and joint pain, nausea/vomiting. Dialysis patient. EXAM: CHEST - 2 VIEW COMPARISON:  Prior chest radiographs 05/06/2021 and earlier. FINDINGS: Cardiomegaly. Central pulmonary vascular congestion with interstitial edema. Bilateral pleural effusions (small to moderate right, small left). No evidence of the thorax. No acute bony  abnormality identified. Surgical clips within the right axilla. IMPRESSION: Cardiomegaly. Central pulmonary vascular congestion with interstitial edema. Bilateral pleural effusions (small to moderate right, small left). Electronically Signed   By: Kellie Simmering D.O.   On: 11/05/2021 18:37   CT Head Wo Contrast  Result Date: 11/05/2021 CLINICAL DATA:  Weakness and hypertension. EXAM: CT HEAD WITHOUT CONTRAST TECHNIQUE: Contiguous axial images were obtained from the base of the skull through the vertex without intravenous contrast. RADIATION DOSE REDUCTION: This exam was performed according to the departmental dose-optimization program which includes automated exposure control, adjustment of the mA and/or kV according to patient size and/or use of iterative reconstruction technique. COMPARISON:  MRI brain dated May 07, 2021. CT head dated August 02, 2020. FINDINGS: Brain: No evidence of acute infarction, hemorrhage, hydrocephalus, extra-axial collection or mass lesion/mass effect. Stable age advanced mild cerebral atrophy and moderate chronic microvascular ischemic changes. Vascular: Atherosclerotic vascular calcification of the carotid siphons. No hyperdense vessel. Skull: Normal. Negative for fracture or focal lesion. Sinuses/Orbits: No acute finding. Chronic bilateral vitreous hemorrhage and progressive right phthisis bulbi. Other: None. IMPRESSION: 1. No acute intracranial abnormality. 2. Stable age advanced mild cerebral atrophy and moderate chronic microvascular ischemic changes. Electronically Signed   By: Titus Dubin M.D.   On: 11/05/2021 18:00    Microbiology: Results for orders placed or performed during the hospital encounter of 11/05/21  Resp Panel by RT-PCR (Flu A&B, Covid) Nasopharyngeal Swab     Status: None   Collection Time: 11/05/21  6:31 PM   Specimen: Nasopharyngeal Swab; Nasopharyngeal(NP) swabs in vial transport medium  Result Value Ref Range Status   SARS Coronavirus 2 by  RT PCR NEGATIVE NEGATIVE Final    Comment: (NOTE) SARS-CoV-2 target nucleic acids are NOT DETECTED.  The SARS-CoV-2 RNA is generally detectable in upper respiratory specimens during the acute phase of infection. The lowest concentration of SARS-CoV-2 viral copies this assay can detect is 138 copies/mL. A negative result does not preclude SARS-Cov-2 infection and should not be used as the sole basis for treatment or other patient management decisions. A negative result may occur with  improper specimen collection/handling, submission of specimen other than nasopharyngeal swab, presence of viral mutation(s) within the areas targeted by this assay, and inadequate number of viral copies(<138 copies/mL). A negative result must be combined with clinical observations, patient history, and epidemiological information. The expected result is Negative.  Fact Sheet for Patients:  EntrepreneurPulse.com.au  Fact Sheet for Healthcare Providers:  IncredibleEmployment.be  This test is no t yet approved or cleared by the [place] FDA and  has been authorized for detection and/or diagnosis of SARS-CoV-2 by FDA under an Emergency Use Authorization (EUA). This EUA will remain  in effect (meaning this test can be used)  for the duration of the COVID-19 declaration under Section 564(b)(1) of the Act, 21 U.S.C.section 360bbb-3(b)(1), unless the authorization is terminated  or revoked sooner.       Influenza A by PCR NEGATIVE NEGATIVE Final   Influenza B by PCR NEGATIVE NEGATIVE Final    Comment: (NOTE) The Xpert Xpress SARS-CoV-2/FLU/RSV plus assay is intended as an aid in the diagnosis of influenza from Nasopharyngeal swab specimens and should not be used as a sole basis for treatment. Nasal washings and aspirates are unacceptable for Xpert Xpress SARS-CoV-2/FLU/RSV testing.  Fact Sheet for Patients: EntrepreneurPulse.com.au  Fact Sheet  for Healthcare Providers: IncredibleEmployment.be  This test is not yet approved or cleared by the [place] FDA and has been authorized for detection and/or diagnosis of SARS-CoV-2 by FDA under an Emergency Use Authorization (EUA). This EUA will remain in effect (meaning this test can be used) for the duration of the COVID-19 declaration under Section 564(b)(1) of the Act, 21 U.S.C. section 360bbb-3(b)(1), unless the authorization is terminated or revoked.  Performed at [facility], [place] [address] [place], [place] [address]     Labs: CBC: Recent Labs  Lab 11/05/21 1725 11/05/21 2306 11/06/21 0419 11/07/21 0636  WBC 8.5 8.4 7.6 8.4  HGB 10.7* 11.8* 9.5* 10.9*  HCT 33.1* 37.6* 30.1* 33.4*  MCV 89.5 89.3 90.4 89.1  PLT 204 206 177 656   Basic Metabolic Panel: Recent Labs  Lab 11/05/21 1633 11/05/21 2306 11/06/21 0419 11/07/21 0636  NA 134*  --  136 135  K 4.1  --  3.8 4.4  CL 93*  --  97* 92*  CO2 27  --  26 27  GLUCOSE 111*  --  90 97  BUN 33*  --  39* 51*  CREATININE 5.12*  --  6.16* 7.74*  CALCIUM 8.9  --  8.8* 9.1  MG  --  2.6*  --   --   PHOS  --   --  4.7* 6.0*   Liver Function Tests: Recent Labs  Lab 11/05/21 1633 11/06/21 0419 11/07/21 0636  AST 20 16  --   ALT 18 15  --   ALKPHOS 92 80  --   BILITOT 0.7 0.6  --   PROT 8.2* 7.2  --   ALBUMIN 3.8 3.2* 3.7   CBG: Recent Labs  Lab 11/06/21 1135 11/06/21 1629 11/06/21 2144 11/07/21 0722 11/07/21 1308  GLUCAP 175* 148* 156* 91 101*    Discharge time spent: 35 minutes.  Signed: Cordelia Poche, MD [facility] 11/07/2021

## 2021-11-08 ENCOUNTER — Ambulatory Visit: Payer: Medicare Other | Admitting: Internal Medicine

## 2021-11-08 DIAGNOSIS — R531 Weakness: Secondary | ICD-10-CM | POA: Diagnosis not present

## 2021-11-08 NOTE — Progress Notes (Signed)
DISCHARGE NOTE SNF Christopher Davila to be discharged [name]  per MD order. Patient verbalized understanding.  Skin clean, dry and intact without evidence of skin break down, no evidence of skin tears noted. IV catheter discontinued intact. Site without signs and symptoms of complications. Dressing and pressure applied. Pt denies pain at the site currently. No complaints noted.  Patient free of lines, drains, and wounds.   Discharge packet assembled. An After Visit Summary (AVS) was printed and given to the EMS personnel. Patient escorted via stretcher and discharged to [name] via ambulance. Report called to accepting facility; all questions and concerns addressed.   Viviano Simas, RN

## 2021-11-20 ENCOUNTER — Ambulatory Visit: Payer: Medicare Other | Admitting: Podiatry

## 2021-11-27 ENCOUNTER — Non-Acute Institutional Stay: Payer: Medicare Other | Admitting: Hospice

## 2021-11-27 ENCOUNTER — Ambulatory Visit (INDEPENDENT_AMBULATORY_CARE_PROVIDER_SITE_OTHER): Payer: Medicare Other | Admitting: Podiatry

## 2021-11-27 DIAGNOSIS — Z515 Encounter for palliative care: Secondary | ICD-10-CM

## 2021-11-27 DIAGNOSIS — G4700 Insomnia, unspecified: Secondary | ICD-10-CM

## 2021-11-27 DIAGNOSIS — M79675 Pain in left toe(s): Secondary | ICD-10-CM

## 2021-11-27 DIAGNOSIS — M79674 Pain in right toe(s): Secondary | ICD-10-CM

## 2021-11-27 DIAGNOSIS — I5032 Chronic diastolic (congestive) heart failure: Secondary | ICD-10-CM

## 2021-11-27 DIAGNOSIS — R531 Weakness: Secondary | ICD-10-CM

## 2021-11-27 DIAGNOSIS — B351 Tinea unguium: Secondary | ICD-10-CM | POA: Diagnosis not present

## 2021-11-27 DIAGNOSIS — N186 End stage renal disease: Secondary | ICD-10-CM

## 2021-11-27 NOTE — Progress Notes (Signed)
[facility] Consult Note Telephone: [phone]  Fax: [phone]  PATIENT NAME: Christopher Davila [id number] (home)  DOB: [birth year] MRN: [id number]  PRIMARY CARE PROVIDER:    [name], MD,  [place] [address] [phone]  REFERRING PROVIDER:   [name], MD [address] [place],   [address] [phone]  RESPONSIBLE PARTY:   Self Emergency contact - cousin - [place]     Name Relation Home Work Mobile   [place] Relative   [phone]   Christopher Davila, Davila Relative [phone]          I met face to face with patient at facility. Palliative Care was asked to follow this patient by consultation request of  Christopher Band, MD to address advance care planning, complex medical decision making and goals of care clarification. Patient endorsed palliative service. This is the initial visit.    ASSESSMENT AND / RECOMMENDATIONS:   CODE STATUS: Patient affirmed he is a Full code  Goals of Care: Goals include to maximize quality of life and symptom management  Symptom Management/Plan: Weakness: Continue PT OT for strengthening and gait training.  Patient is ambulatory with rolling walker.  Fall precautions reiterated. ESRD: Managed with Dialysis Mon Wed Friday CHF: Continue on Torsemide and metolazone as ordered. Check weight daily and report weight gain of 3 Ibs in a day or 5 pounds in a week. Fluid restriction 1500cc, no added salt.  Depression: Continue Zoloft.  Encourage socialization and participation in facility activities.  Followed by Psych Insomnia: Continue melatonin 10 mg at bedtime. Follow up: Palliative care will continue to follow for complex medical decision making, advance care planning, and clarification of goals. Return 6 weeks or prn.Encouraged to call provider sooner with any concerns.   Family  /Caregiver/Community Supports: Patient in SNF for ongoing care  HOSPICE ELIGIBILITY/DIAGNOSIS: TBD  Chief Complaint: Follow-up visit  HISTORY OF PRESENT ILLNESS:  Christopher Davila is a 51 y.o. year old male  with multiple medical conditions including generalized weakness, end-stage renal disease on dialysis, CHF with preserved ejection fraction, chronic respiratory failure with hypoxia, hypertension, low left shoulder osteoarthritis, depression.  History obtained from review of EMR, discussion with primary team, caregiver, family and/or Christopher Davila.  Review and summarization of Epic records shows history from other than patient. Rest of 10 point ROS asked and negative.  I reviewed, as needed, available labs, patient records, imaging, studies and related documents from the EMR.  PAST MEDICAL HISTORY:  Active Ambulatory Problems    Diagnosis Date Noted   Type 2 diabetes mellitus with hypoglycemia ([place]) 06/26/2012   Essential hypertension    Hyperlipidemia    Gastroparesis due to DM [facility])    Alcohol use    Acute loss of vision, right 06/16/2019   Homeless 09/19/2019   Anemia of chronic disease 09/19/2019   Vision loss, left eye 09/19/2019   Vision loss of left eye 09/21/2019   Weakness 10/05/2019   ESRD on dialysis [facility])    Heart failure with preserved ejection fraction ([place])    Goals of care, counseling/discussion    Pressure injury of skin 11/06/2019   History of thoracentesis    End-stage renal disease on hemodialysis ([place])    Pain due to onychomycosis of toenails of both feet 07/31/2021   Skin lesion 07/31/2021   Generalized weakness 11/05/2021   Resolved Ambulatory Problems    Diagnosis Date Noted  Hypertension 06/26/2012   Pancreatitis 06/26/2012   Weight loss 06/26/2012   Oral candidiasis 06/26/2012   Abdominal pain 12/28/2012   Chest pain 02/14/2014   Nausea    Dyspnea    Heart failure ([place]) 02/13/2019   Epigastric abdominal pain    Hypoxia    Wheezing    CKD  (chronic kidney disease) stage 4, GFR 15-29 ml/min (HCC)    Acute kidney injury superimposed on chronic kidney disease ([place])    Acute renal failure superimposed on stage 3 chronic kidney disease ([place]) 04/17/2019   Hypertensive urgency 04/17/2019   Hypoalbuminemia 04/17/2019   Acute kidney injury superimposed on chronic kidney disease ([place]) 09/19/2019   CHF (congestive heart failure) ([place]) 09/26/2019   Acute on chronic diastolic CHF (congestive heart failure) ([place]) 07/37/1062   Metabolic acidosis, increased anion gap 10/13/2019   Acute urinary retention 10/13/2019   Palliative care encounter    Acute respiratory failure with hypoxia ([place]) 03/26/2021   COVID-19    Hypervolemia    Multifocal pneumonia 05/06/2021   Past Medical History:  Diagnosis Date   Anemia    Blindness 09/04/2019   Diabetes mellitus    Diarrhea 10/14/2019   ESRD (end stage renal disease) ([place])    Neuropathy of lower extremity     SOCIAL HX:  Social History   Tobacco Use   Smoking status: Former    Packs/day: 0.00    Types: Cigarettes    Quit date: 03/22/2015    Years since quitting: 6.6   Smokeless tobacco: Never  Substance Use Topics   Alcohol use: Not Currently     FAMILY HX:  Family History  Problem Relation Age of Onset   Diabetes Mother    Lung disease Mother    Hypertension Mother    Diabetes Maternal Aunt    CAD Maternal Aunt    CAD Cousin       ALLERGIES:  Allergies  Allergen Reactions   Morphine And Related Itching and Other (See Comments)    Pt prefers not to be given this drug      PERTINENT MEDICATIONS:  Outpatient Encounter Medications as of 11/27/2021  Medication Sig   albuterol (VENTOLIN HFA) 108 (90 Base) MCG/ACT inhaler Inhale 2 puffs into the lungs every 4 (four) hours as needed for shortness of breath.   amLODipine (NORVASC) 10 MG tablet Take 0.5 tablets (5 mg total) by mouth daily. (Patient not taking: Reported on 11/06/2021)   aspirin EC 81 MG tablet Take 1 tablet (81  mg total) by mouth daily.   atorvastatin (LIPITOR) 40 MG tablet Take 1 tablet (40 mg total) by mouth daily.   Atropine Sulfate 0.01 % SOLN Place 1 drop into both eyes in the morning and at bedtime.   brimonidine (ALPHAGAN P) 0.1 % SOLN Place 1 drop into the left eye in the morning and at bedtime.   calcium acetate (PHOSLO) 667 MG capsule Take 1 capsule (667 mg total) by mouth 3 (three) times daily with meals. (Patient taking differently: Take 1,334 mg by mouth 3 (three) times daily with meals.)   capsaicin (ZOSTRIX) 0.025 % cream Apply 1 application. topically every 8 (eight) hours as needed (pain).   Dextran 70-Hypromellose (TEARS PURE) 0.1-0.3 % SOLN Place 1 drop into both eyes in the morning, at noon, in the evening, and at bedtime.   dorzolamide-timolol (COSOPT) 22.3-6.8 MG/ML ophthalmic solution Place 1 drop into the left eye 2 (two) times daily.   ferrous sulfate 325 (65 FE) MG tablet Take  325 mg by mouth daily. (Patient not taking: Reported on 11/06/2021)   guaiFENesin (ROBITUSSIN) 100 MG/5ML liquid Take 100 mg by mouth every 4 (four) hours as needed for cough.   hydrALAZINE (APRESOLINE) 25 MG tablet Take 37.5 mg by mouth 3 (three) times daily.   ipratropium-albuterol (DUONEB) 0.5-2.5 (3) MG/3ML SOLN Take 3 mLs by nebulization every 6 (six) hours as needed (shortness of breath or wheezing).   latanoprost (XALATAN) 0.005 % ophthalmic solution Place 1 drop into the left eye at bedtime.   losartan (COZAAR) 100 MG tablet Take 1 tablet (100 mg total) by mouth daily.   Melatonin 10 MG CAPS Take 10 mg by mouth at bedtime.   Menthol, Topical Analgesic, 5 % GEL Apply 1 application. topically every 8 (eight) hours as needed (hand/wrist pain).   Nutritional Supplements (RESOURCE 2.0 PO) Take 120 mLs by mouth in the morning and at bedtime.   olopatadine (PATANOL) 0.1 % ophthalmic solution Place 2 drops into both eyes daily.   ondansetron (ZOFRAN) 4 MG tablet Take 4 mg by mouth every 6 (six) hours as  needed for nausea or vomiting.   oxyCODONE-acetaminophen (PERCOCET/ROXICET) 5-325 MG tablet Take 1 tablet by mouth See admin instructions. 1 tablet once daily every Tuesday, Thursday, Saturday, and Sunday. May take an additional 1 tablet every 6 hours as needed on Monday's Wednesday's, and Friday's.   OXYGEN Inhale 3 L into the lungs continuous.   polyethylene glycol (MIRALAX / GLYCOLAX) 17 g packet Take 17 g by mouth See admin instructions. Every three days.  May take an additional 17g daily as needed for constipation.   pramoxine-zinc acetate (CALADRYL) 1-0.1 % LOTN Apply 1 application. topically every 8 (eight) hours as needed (back, torso, arms, and legs for puritus).   prednisoLONE acetate (PRED FORTE) 1 % ophthalmic suspension Place 1 drop into both eyes 4 (four) times daily.   pregabalin (LYRICA) 25 MG capsule Take 1 capsule (25 mg total) by mouth daily.   senna-docusate (SENOKOT-S) 8.6-50 MG tablet Take 2 tablets by mouth 2 (two) times daily.   torsemide (DEMADEX) 100 MG tablet Take 100 mg by mouth 2 (two) times daily.   vitamin B-12 1000 MCG tablet Take 1 tablet (1,000 mcg total) by mouth daily.   Vitamin D, Ergocalciferol, (DRISDOL) 1.25 MG (50000 UNIT) CAPS capsule Take 50,000 Units by mouth every Sunday.   No facility-administered encounter medications on file as of 11/27/2021.   I spent 45 minutes providing this consultation; time includes time spent with patient/family, chart review and documentation.  More than 50% of the time in this consultation was spent on care coordination/communication.  Thank you for the opportunity to participate in the care of Mr. Egger.  The palliative care team will continue to follow. Please call our office at [phone] if we can be of additional assistance.   Note: Portions of this note were generated with [employer]. Dictation errors may occur despite best attempts at proofreading.  Teodoro Spray, NP

## 2021-11-27 NOTE — Progress Notes (Signed)
   SUBJECTIVE Patient with a history of diabetes mellitus presents to office today complaining of elongated, thickened nails that cause pain while ambulating in shoes.  Patient is unable to trim their own nails. Patient is here for further evaluation and treatment.   Past Medical History:  Diagnosis Date   Acute on chronic diastolic CHF (congestive heart failure) ([place]) 09/27/2019   Acute respiratory failure with hypoxia ([place]) 03/26/2021   Acute urinary retention 10/13/2019   Anemia    Blindness 09/04/2019   CHF (congestive heart failure) ([place])    COVID-19    Diabetes mellitus    Diarrhea 10/14/2019   Epigastric abdominal pain    ESRD (end stage renal disease) ([place])    Gastroparesis due to DM ([place])    Hypertension    Hypertensive urgency 04/17/2019   Hypervolemia    Hypoalbuminemia 04/17/2019   Hypoxia    Metabolic acidosis, increased anion gap 10/13/2019   Multifocal pneumonia 05/06/2021   Neuropathy of lower extremity    Oral candidiasis 06/26/2012   Palliative care encounter    Pancreatitis 06/26/2012   Weight loss 06/26/2012    OBJECTIVE General Patient is awake, alert, and oriented x 3 and in no acute distress. Derm Skin is dry and supple bilateral. Negative open lesions or macerations. Remaining integument unremarkable. Nails are tender, long, thickened and dystrophic with subungual debris, consistent with onychomycosis, 1-5 bilateral. No signs of infection noted. Vasc  DP and PT pedal pulses palpable bilaterally. Temperature gradient within normal limits.  Neuro Epicritic and protective threshold sensation diminished bilaterally.  Musculoskeletal Exam No symptomatic pedal deformities noted bilateral. Muscular strength within normal limits.  ASSESSMENT 1. Diabetes Mellitus w/ peripheral neuropathy 2.  Pain due to onychomycosis of toenails bilateral  PLAN OF CARE 1. Patient evaluated today. 2. Instructed to maintain good pedal hygiene and foot care. Stressed importance  of controlling blood sugar.  3. Mechanical debridement of nails 1-5 bilaterally performed using a nail nipper. Filed with dremel without incident.  4. Return to clinic in 3 mos.     Edrick Kins, DPM [facility]  Dr. Edrick Kins, DPM    [address], [place] [address]                Office [phone]  Fax [phone]

## 2022-02-26 ENCOUNTER — Ambulatory Visit (INDEPENDENT_AMBULATORY_CARE_PROVIDER_SITE_OTHER): Payer: Medicare Other | Admitting: Podiatry

## 2022-02-26 DIAGNOSIS — M79674 Pain in right toe(s): Secondary | ICD-10-CM | POA: Diagnosis not present

## 2022-02-26 DIAGNOSIS — M79675 Pain in left toe(s): Secondary | ICD-10-CM

## 2022-02-26 DIAGNOSIS — B351 Tinea unguium: Secondary | ICD-10-CM

## 2022-02-26 NOTE — Progress Notes (Signed)
   SUBJECTIVE Patient with a history of diabetes mellitus presents to office today complaining of elongated, thickened nails that cause pain while ambulating in shoes.  Patient is unable to trim their own nails. Patient is here for further evaluation and treatment.   Past Medical History:  Diagnosis Date   Acute on chronic diastolic CHF (congestive heart failure) ([place]) 09/27/2019   Acute respiratory failure with hypoxia ([place]) 03/26/2021   Acute urinary retention 10/13/2019   Anemia    Blindness 09/04/2019   CHF (congestive heart failure) ([place])    COVID-19    Diabetes mellitus    Diarrhea 10/14/2019   Epigastric abdominal pain    ESRD (end stage renal disease) ([place])    Gastroparesis due to DM ([place])    Hypertension    Hypertensive urgency 04/17/2019   Hypervolemia    Hypoalbuminemia 04/17/2019   Hypoxia    Metabolic acidosis, increased anion gap 10/13/2019   Multifocal pneumonia 05/06/2021   Neuropathy of lower extremity    Oral candidiasis 06/26/2012   Palliative care encounter    Pancreatitis 06/26/2012   Weight loss 06/26/2012    OBJECTIVE General Patient is awake, alert, and oriented x 3 and in no acute distress. Derm Skin is dry and supple bilateral. Negative open lesions or macerations. Remaining integument unremarkable. Nails are tender, long, thickened and dystrophic with subungual debris, consistent with onychomycosis, 1-5 bilateral. No signs of infection noted. Vasc  DP and PT pedal pulses palpable bilaterally. Temperature gradient within normal limits.  Neuro Epicritic and protective threshold sensation diminished bilaterally.  Musculoskeletal Exam No symptomatic pedal deformities noted bilateral. Muscular strength within normal limits.  ASSESSMENT 1. Diabetes Mellitus w/ peripheral neuropathy 2.  Pain due to onychomycosis of toenails bilateral  PLAN OF CARE 1. Patient evaluated today. 2. Instructed to maintain good pedal hygiene and foot care. Stressed importance  of controlling blood sugar.  3. Mechanical debridement of nails 1-5 bilaterally performed using a nail nipper. Filed with dremel without incident.  4. Return to clinic in 3 mos.     Edrick Kins, DPM [facility]  Dr. Edrick Kins, DPM    [address], [place] [address]                Office [phone]  Fax [phone]

## 2022-05-07 ENCOUNTER — Encounter (HOSPITAL_COMMUNITY): Payer: Self-pay | Admitting: Emergency Medicine

## 2022-05-07 ENCOUNTER — Emergency Department (HOSPITAL_COMMUNITY): Payer: Medicare Other

## 2022-05-07 ENCOUNTER — Emergency Department (HOSPITAL_COMMUNITY)
Admission: EM | Admit: 2022-05-07 | Discharge: 2022-05-07 | Disposition: A | Discharge status: Home / Self Care | Payer: Medicare Other | Attending: Emergency Medicine | Admitting: Emergency Medicine

## 2022-05-07 DIAGNOSIS — N186 End stage renal disease: Secondary | ICD-10-CM | POA: Insufficient documentation

## 2022-05-07 DIAGNOSIS — R5383 Other fatigue: Secondary | ICD-10-CM | POA: Diagnosis present

## 2022-05-07 DIAGNOSIS — E1122 Type 2 diabetes mellitus with diabetic chronic kidney disease: Secondary | ICD-10-CM | POA: Insufficient documentation

## 2022-05-07 DIAGNOSIS — Z992 Dependence on renal dialysis: Secondary | ICD-10-CM | POA: Insufficient documentation

## 2022-05-07 DIAGNOSIS — Z79899 Other long term (current) drug therapy: Secondary | ICD-10-CM | POA: Insufficient documentation

## 2022-05-07 DIAGNOSIS — R531 Weakness: Secondary | ICD-10-CM | POA: Diagnosis not present

## 2022-05-07 DIAGNOSIS — Z87891 Personal history of nicotine dependence: Secondary | ICD-10-CM | POA: Diagnosis not present

## 2022-05-07 DIAGNOSIS — I5033 Acute on chronic diastolic (congestive) heart failure: Secondary | ICD-10-CM | POA: Insufficient documentation

## 2022-05-07 DIAGNOSIS — R0600 Dyspnea, unspecified: Secondary | ICD-10-CM

## 2022-05-07 DIAGNOSIS — U071 COVID-19: Secondary | ICD-10-CM | POA: Insufficient documentation

## 2022-05-07 DIAGNOSIS — I1311 Hypertensive heart and chronic kidney disease without heart failure, with stage 5 chronic kidney disease, or end stage renal disease: Secondary | ICD-10-CM | POA: Diagnosis not present

## 2022-05-07 DIAGNOSIS — Z7982 Long term (current) use of aspirin: Secondary | ICD-10-CM | POA: Diagnosis not present

## 2022-05-07 LAB — COMPREHENSIVE METABOLIC PANEL
ALT: 23 U/L (ref 0–44)
AST: 23 U/L (ref 15–41)
Albumin: 4.1 g/dL (ref 3.5–5.0)
Alkaline Phosphatase: 118 U/L (ref 38–126)
Anion gap: 14 (ref 5–15)
BUN: 14 mg/dL (ref 6–20)
CO2: 32 mmol/L (ref 22–32)
Calcium: 9.3 mg/dL (ref 8.9–10.3)
Chloride: 93 mmol/L — ABNORMAL LOW (ref 98–111)
Creatinine, Ser: 3.74 mg/dL — ABNORMAL HIGH (ref 0.61–1.24)
GFR, Estimated: 19 mL/min — ABNORMAL LOW (ref >60)
Glucose, Bld: 115 mg/dL — ABNORMAL HIGH (ref 70–99)
Potassium: 3.5 mmol/L (ref 3.5–5.1)
Sodium: 139 mmol/L (ref 135–145)
Total Bilirubin: 1 mg/dL (ref 0.3–1.2)
Total Protein: 8.6 g/dL — ABNORMAL HIGH (ref 6.5–8.1)

## 2022-05-07 LAB — CBC WITH DIFFERENTIAL/PLATELET
Abs Immature Granulocytes: 0.04 10*3/uL (ref 0.00–0.07)
Basophils Absolute: 0 10*3/uL (ref 0.0–0.1)
Basophils Relative: 0 %
Eosinophils Absolute: 0.2 10*3/uL (ref 0.0–0.5)
Eosinophils Relative: 2 %
HCT: 33.5 % — ABNORMAL LOW (ref 39.0–52.0)
Hemoglobin: 10.8 g/dL — ABNORMAL LOW (ref 13.0–17.0)
Immature Granulocytes: 0 %
Lymphocytes Relative: 5 %
Lymphs Abs: 0.4 10*3/uL — ABNORMAL LOW (ref 0.7–4.0)
MCH: 30.5 pg (ref 26.0–34.0)
MCHC: 32.2 g/dL (ref 30.0–36.0)
MCV: 94.6 fL (ref 80.0–100.0)
Monocytes Absolute: 1.1 10*3/uL — ABNORMAL HIGH (ref 0.1–1.0)
Monocytes Relative: 12 %
Neutro Abs: 7.5 10*3/uL (ref 1.7–7.7)
Neutrophils Relative %: 81 %
Platelets: 104 10*3/uL — ABNORMAL LOW (ref 150–400)
RBC: 3.54 MIL/uL — ABNORMAL LOW (ref 4.22–5.81)
RDW: 16.4 % — ABNORMAL HIGH (ref 11.5–15.5)
WBC: 9.3 10*3/uL (ref 4.0–10.5)
nRBC: 0 % (ref 0.0–0.2)

## 2022-05-07 LAB — URINALYSIS, ROUTINE W REFLEX MICROSCOPIC
Bilirubin Urine: NEGATIVE
Glucose, UA: 50 mg/dL — AB
Hgb urine dipstick: NEGATIVE
Ketones, ur: NEGATIVE mg/dL
Leukocytes,Ua: NEGATIVE
Nitrite: NEGATIVE
Protein, ur: >=300 mg/dL — AB
Specific Gravity, Urine: 1.012 (ref 1.005–1.030)
pH: 8 (ref 5.0–8.0)

## 2022-05-07 LAB — APTT: aPTT: 37 seconds — ABNORMAL HIGH (ref 24–36)

## 2022-05-07 LAB — RESP PANEL BY RT-PCR (FLU A&B, COVID) ARPGX2
Influenza A by PCR: NEGATIVE
Influenza B by PCR: NEGATIVE
SARS Coronavirus 2 by RT PCR: POSITIVE — AB

## 2022-05-07 LAB — LACTIC ACID, PLASMA
Lactic Acid, Venous: 0.9 mmol/L (ref 0.5–1.9)
Lactic Acid, Venous: 0.8 mmol/L (ref 0.5–1.9)

## 2022-05-07 LAB — PROTIME-INR
INR: 1.1 (ref 0.8–1.2)
Prothrombin Time: 14.5 seconds (ref 11.4–15.2)

## 2022-05-07 MED ORDER — ACETAMINOPHEN 500 MG PO TABS
1000.0000 mg | ORAL_TABLET | Freq: Four times a day (QID) | ORAL | Status: DC | PRN
Start: 2022-05-07 — End: 2022-05-08
  Administered 2022-05-07: 1000 mg via ORAL
  Filled 2022-05-07 (×2): qty 2

## 2022-05-07 MED ORDER — DEXAMETHASONE 6 MG PO TABS: 6.0000 mg | ORAL_TABLET | Freq: Every day | ORAL | 0 refills | Status: AC

## 2022-05-07 MED ORDER — METHYLPREDNISOLONE SODIUM SUCC 125 MG IJ SOLR
125.0000 mg | Freq: Once | INTRAMUSCULAR | Status: AC
  Administered 2022-05-07: 125 mg via INTRAVENOUS
  Filled 2022-05-07: qty 2

## 2022-05-07 MED ORDER — SODIUM CHLORIDE 0.9 % IV SOLN
1.0000 g | Freq: Once | INTRAVENOUS | Status: AC
  Administered 2022-05-07: 1 g via INTRAVENOUS
  Filled 2022-05-07: qty 10

## 2022-05-07 MED ORDER — OXYCODONE HCL 5 MG PO TABS
5.0000 mg | ORAL_TABLET | Freq: Once | ORAL | Status: AC
  Administered 2022-05-07: 5 mg via ORAL
  Filled 2022-05-07: qty 1

## 2022-05-07 MED ORDER — NIRMATRELVIR/RITONAVIR (PAXLOVID) TABLET (RENAL DOSING): 2.0000 | ORAL_TABLET | Freq: Two times a day (BID) | ORAL | 0 refills | Status: AC

## 2022-05-07 NOTE — ED Notes (Signed)
PT given snacks and informed he is waiting for PTAR

## 2022-05-07 NOTE — ED Provider Notes (Signed)
[facility] EMERGENCY DEPARTMENT Provider Note   CSN: [id number] Arrival date & time: 05/07/22  1505     History  Chief Complaint  Patient presents with   Generalized Body Aches   Weakness    Christopher Davila is a 51 y.o. male.  Patient as above with significant medical history as below, including diastolic CHF, DM< ESRD on HD (normally MWF, had HD today 2/2 holiday schedule, stopped 1 hour short 2/2 dib), HTN, gastroparesis who presents to the ED with complaint of dib, fatigue. Pt feeling unwell last 24 hours, coughing non productive, feeling feverish, shaking chills, worsening DIB from baseline. Normally on [facility], was upped to 6L  in triage due to hypoxia. No chest pain or abd pain, no n/v, no change to bowel/bladder activity. No rashes. Roommate sick with covid 19, he is unsure if he was vaccinated for covid. No medications PTA. Did not take medications today. He is unsure why he is on home oxygen but typically uses 2LPM     Past Medical History:  Diagnosis Date   Acute on chronic diastolic CHF (congestive heart failure) ([place]) 09/27/2019   Acute respiratory failure with hypoxia ([place]) 03/26/2021   Acute urinary retention 10/13/2019   Anemia    Blindness 09/04/2019   CHF (congestive heart failure) ([place])    COVID-19    Diabetes mellitus    Diarrhea 10/14/2019   Epigastric abdominal pain    ESRD (end stage renal disease) ([place])    Gastroparesis due to DM ([place])    Hypertension    Hypertensive urgency 04/17/2019   Hypervolemia    Hypoalbuminemia 04/17/2019   Hypoxia    Metabolic acidosis, increased anion gap 10/13/2019   Multifocal pneumonia 05/06/2021   Neuropathy of lower extremity    Oral candidiasis 06/26/2012   Palliative care encounter    Pancreatitis 06/26/2012   Weight loss 06/26/2012    Past Surgical History:  Procedure Laterality Date   AV FISTULA PLACEMENT Right 10/29/2019   Procedure: RIGHT ARM BRACHIOBASILIC ARTERIOVENOUS (AV) FISTULA  CREATION;  Surgeon: Rosetta Posner, MD;  Location: [facility] OR;  Service: Vascular;  Laterality: Right;   [place] Right 01/05/2020   Procedure: RIGHT ARM SECOND STAGE [place];  Surgeon: Rosetta Posner, MD;  Location: [facility] OR;  Service: Vascular;  Laterality: Right;   EYE SURGERY     IR FLUORO GUIDE CV LINE RIGHT  10/28/2019   IR US GUIDE VASC ACCESS RIGHT  10/28/2019     The history is provided by the patient. No language interpreter was used.  Weakness Associated symptoms: cough, fever and shortness of breath   Associated symptoms: no abdominal pain, no chest pain, no headaches, no nausea and no vomiting        Home Medications Prior to Admission medications   Medication Sig Start Date End Date Taking? Authorizing Provider  dexamethasone (DECADRON) 6 MG tablet Take 1 tablet (6 mg total) by mouth daily for 5 days. 05/07/22 05/12/22 Yes Jeanell Sparrow, DO  nirmatrelvir/ritonavir EUA, renal dosing, (PAXLOVID) 10 x 150 MG & 10 x 100MG  TABS Take 2 tablets by mouth 2 (two) times daily for 5 days. Patient GFR is 19 . Take nirmatrelvir (150 mg) one tablet twice daily for 5 days and ritonavir (100 mg) one tablet twice daily for 5 days. 05/07/22 05/12/22 Yes Wynona Dove A, DO  albuterol (VENTOLIN HFA) 108 (90 Base) MCG/ACT inhaler Inhale 2 puffs into the lungs every 4 (four) hours as needed for  shortness of breath.    [provider]  amLODipine (NORVASC) 10 MG tablet Take 0.5 tablets (5 mg total) by mouth daily. 05/10/21   Nita Sells, MD  aspirin EC 81 MG tablet Take 1 tablet (81 mg total) by mouth daily. 05/24/15   Ghimire, Henreitta Leber, MD  atorvastatin (LIPITOR) 40 MG tablet Take 1 tablet (40 mg total) by mouth daily. 12/05/20   [place], Peter M, MD  Atropine Sulfate 0.01 % SOLN Place 1 drop into both eyes in the morning and at bedtime. 04/24/21   [provider]  brimonidine (ALPHAGAN P) 0.1 % SOLN Place 1 drop into the left eye in the morning  and at bedtime.    [provider]  calcium acetate (PHOSLO) 667 MG capsule Take 1 capsule (667 mg total) by mouth 3 (three) times daily with meals. Patient taking differently: Take 1,334 mg by mouth 3 (three) times daily with meals. 11/19/19   Charlynne Cousins, MD  capsaicin (ZOSTRIX) 0.025 % cream Apply 1 application. topically every 8 (eight) hours as needed (pain).    [provider]  Dextran 70-Hypromellose (TEARS PURE) 0.1-0.3 % SOLN Place 1 drop into both eyes in the morning, at noon, in the evening, and at bedtime.    [provider]  dorzolamide-timolol (COSOPT) 22.3-6.8 MG/ML ophthalmic solution Place 1 drop into the left eye 2 (two) times daily. 09/23/19   Geradine Girt, DO  ferrous sulfate 325 (65 FE) MG tablet Take 325 mg by mouth daily. 08/25/20   [provider]  guaiFENesin (ROBITUSSIN) 100 MG/5ML liquid Take 100 mg by mouth every 4 (four) hours as needed for cough.    [provider]  hydrALAZINE (APRESOLINE) 25 MG tablet Take 37.5 mg by mouth 3 (three) times daily.    [provider]  ipratropium-albuterol (DUONEB) 0.5-2.5 (3) MG/3ML SOLN Take 3 mLs by nebulization every 6 (six) hours as needed (shortness of breath or wheezing).    [provider]  latanoprost (XALATAN) 0.005 % ophthalmic solution Place 1 drop into the left eye at bedtime. 09/23/19   Geradine Girt, DO  losartan (COZAAR) 100 MG tablet Take 1 tablet (100 mg total) by mouth daily. 11/19/19   Charlynne Cousins, MD  Melatonin 10 MG CAPS Take 10 mg by mouth at bedtime.    [provider]  Menthol, Topical Analgesic, 5 % GEL Apply 1 application. topically every 8 (eight) hours as needed (hand/wrist pain).    [provider]  Nutritional Supplements (RESOURCE 2.0 PO) Take 120 mLs by mouth in the morning and at bedtime.    [provider]  olopatadine (PATANOL) 0.1 % ophthalmic solution Place 2 drops into both eyes daily.    [provider]  ondansetron (ZOFRAN) 4 MG tablet Take 4 mg by mouth every 6 (six) hours as needed for nausea or vomiting.    [provider]  OXYGEN Inhale 3 L into the lungs continuous.    [provider]  polyethylene glycol (MIRALAX / GLYCOLAX) 17 g packet Take 17 g by mouth See admin instructions. Every three days.  May take an additional 17g daily as needed for constipation.    [provider]  pramoxine-zinc acetate (CALADRYL) 1-0.1 % LOTN Apply 1 application. topically every 8 (eight) hours as needed (back, torso, arms, and legs for puritus).    [provider]  prednisoLONE acetate (PRED FORTE) 1 % ophthalmic suspension Place 1 drop into both eyes 4 (four) times  daily.    [provider]  pregabalin (LYRICA) 25 MG capsule Take 1 capsule (25 mg total) by mouth daily. 11/07/21   Mariel Aloe, MD  senna-docusate (SENOKOT-S) 8.6-50 MG tablet Take 2 tablets by mouth 2 (two) times daily.    [provider]  torsemide (DEMADEX) 100 MG tablet Take 100 mg by mouth 2 (two) times daily.    [provider]  vitamin B-12 1000 MCG tablet Take 1 tablet (1,000 mcg total) by mouth daily. 05/11/21   Nita Sells, MD  Vitamin D, Ergocalciferol, (DRISDOL) 1.25 MG (50000 UNIT) CAPS capsule Take 50,000 Units by mouth every Sunday.    [provider]      Allergies    Morphine and related    Review of Systems   Review of Systems  Constitutional:  Positive for appetite change, chills, fatigue and fever.  HENT:  Positive for congestion and sore throat. Negative for facial swelling and trouble swallowing.   Eyes:  Negative for photophobia and visual disturbance.  Respiratory:  Positive for cough and shortness of breath.   Cardiovascular:  Negative for chest pain and palpitations.  Gastrointestinal:  Negative for abdominal pain, nausea and vomiting.  Endocrine: Negative for polydipsia and polyuria.  Genitourinary:  Negative for  difficulty urinating and hematuria.  Musculoskeletal:  Negative for gait problem and joint swelling.  Skin:  Negative for pallor and rash.  Neurological:  Negative for syncope, numbness and headaches.  Psychiatric/Behavioral:  Negative for agitation and confusion.     Physical Exam Updated Vital Signs BP (!) 178/99   Pulse 91   Temp 98.6 F (37 C) (Oral)   Resp 18   Ht 5\' 3"  (1.6 m)   Wt 60.8 kg   SpO2 91%   BMI 23.74 kg/m  Physical Exam Vitals and nursing note reviewed.  Constitutional:      General: He is not in acute distress.    Appearance: He is well-developed.  HENT:     Head: Normocephalic and atraumatic.     Right Ear: External ear normal.     Left Ear: External ear normal.     Mouth/Throat:     Mouth: Mucous membranes are dry.  Eyes:     General: No scleral icterus. Cardiovascular:     Rate and Rhythm: Regular rhythm. Tachycardia present.     Pulses: Normal pulses.     Heart sounds: Normal heart sounds.  Pulmonary:     Effort: Pulmonary effort is normal. Tachypnea present. No respiratory distress.     Breath sounds: Normal breath sounds. Decreased air movement present.  Abdominal:     General: Abdomen is flat.     Palpations: Abdomen is soft.     Tenderness: There is no abdominal tenderness.  Musculoskeletal:        General: Normal range of motion.     Cervical back: Normal range of motion.     Right lower leg: No edema.     Left lower leg: No edema.  Skin:    General: Skin is warm and dry.     Capillary Refill: Capillary refill takes less than 2 seconds.  Neurological:     Mental Status: He is alert and oriented to person, place, and time.     GCS: GCS eye subscore is 4. GCS verbal subscore is 5. GCS motor subscore is 6.  Psychiatric:        Mood and Affect: Mood normal.        Behavior: Behavior  normal.     ED Results / Procedures / Treatments   Labs (all labs ordered are listed, but only abnormal results are displayed) Labs Reviewed  RESP  PANEL BY RT-PCR (FLU A&B, COVID) ARPGX2 - Abnormal; Notable for the following components:      Result Value   SARS Coronavirus 2 by RT PCR POSITIVE (*)    All other components within normal limits  COMPREHENSIVE METABOLIC PANEL - Abnormal; Notable for the following components:   Chloride 93 (*)    Glucose, Bld 115 (*)    Creatinine, Ser 3.74 (*)    Total Protein 8.6 (*)    GFR, Estimated 19 (*)    All other components within normal limits  CBC WITH DIFFERENTIAL/PLATELET - Abnormal; Notable for the following components:   RBC 3.54 (*)    Hemoglobin 10.8 (*)    HCT 33.5 (*)    RDW 16.4 (*)    Platelets 104 (*)    Lymphs Abs 0.4 (*)    Monocytes Absolute 1.1 (*)    All other components within normal limits  APTT - Abnormal; Notable for the following components:   aPTT 37 (*)    All other components within normal limits  URINALYSIS, ROUTINE W REFLEX MICROSCOPIC - Abnormal; Notable for the following components:   Glucose, UA 50 (*)    Protein, ur >=300 (*)    Bacteria, UA RARE (*)    All other components within normal limits  CULTURE, BLOOD (ROUTINE X 2)  CULTURE, BLOOD (ROUTINE X 2)  LACTIC ACID, PLASMA  PROTIME-INR  LACTIC ACID, PLASMA    EKG EKG Interpretation  Date/Time:  Tuesday May 07 2022 15:38:40 EST Ventricular Rate:  107 PR Interval:  148 QRS Duration: 88 QT Interval:  366 QTC Calculation: 488 R Axis:   36 Text Interpretation: Sinus tachycardia Otherwise normal ECG When compared with ECG of 05-Nov-2021 16:19, PREVIOUS ECG IS PRESENT Confirmed by Wynona Dove ([id number]) on 05/07/2022 3:58:00 PM  Radiology DG Chest 2 View  Result Date: 05/07/2022 CLINICAL DATA:  Shortness of breath EXAM: CHEST - 2 VIEW COMPARISON:  Previous studies including the examination of 11/05/2021 FINDINGS: Transverse diameter of heart is increased. Central pulmonary vessels are more prominent. Increased interstitial markings are seen in parahilar regions and lower lung fields. Small to  moderate bilateral pleural effusions are seen, more so on the right side. Surgical clips are seen in right axilla. IMPRESSION: Cardiomegaly. Central pulmonary vessels are more prominent suggesting CHF. Increased interstitial markings in parahilar regions and lower lung fields suggest pulmonary edema. Bilateral pleural effusions, more so on the right side. Electronically Signed   By: Elmer Picker M.D.   On: 05/07/2022 16:21    Procedures Ultrasound ED Peripheral IV (Provider)  Date/Time: 05/07/2022 4:34 PM  Performed by: Jeanell Sparrow, DO Authorized by: Jeanell Sparrow, DO   Procedure details:    Indications: multiple failed IV attempts and poor IV access     Skin Prep: chlorhexidine gluconate     Location:  Left AC   Angiocath:  20 G   Bedside Ultrasound Guided: Yes     Images: not archived     Patient tolerated procedure without complications: Yes     Dressing applied: Yes   .Critical Care  Performed by: Jeanell Sparrow, DO Authorized by: Jeanell Sparrow, DO   Critical care provider statement:    Critical care time (minutes):  44   Critical care time was exclusive of:  Separately billable procedures  and treating other patients   Critical care was necessary to treat or prevent imminent or life-threatening deterioration of the following conditions:  Respiratory failure and sepsis   Critical care was time spent personally by me on the following activities:  Development of treatment plan with patient or surrogate, discussions with consultants, evaluation of patient's response to treatment, examination of patient, ordering and review of laboratory studies, ordering and review of radiographic studies, ordering and performing treatments and interventions, pulse oximetry, re-evaluation of patient's condition, review of old charts and obtaining history from patient or surrogate   Care discussed with: admitting provider       Medications Ordered in ED Medications  acetaminophen (TYLENOL)  tablet 1,000 mg (1,000 mg Oral Given 05/07/22 1548)  cefTRIAXone (ROCEPHIN) 1 g in sodium chloride 0.9 % 100 mL IVPB (0 g Intravenous Stopped 05/07/22 1730)  methylPREDNISolone sodium succinate (SOLU-MEDROL) 125 mg/2 mL injection 125 mg (125 mg Intravenous Given 05/07/22 1840)    ED Course/ Medical Decision Making/ A&P Clinical Course as of 05/07/22 1912  Tue May 07, 2022  1623 Blood Culture (routine x 2) [SG]    Clinical Course User Index [SG] Jeanell Sparrow, DO                           Medical Decision Making Amount and/or Complexity of Data Reviewed Labs: ordered. Decision-making details documented in ED Course.  Risk Prescription drug management.   This patient presents to the ED with chief complaint(s) of fatigue, fever/chills/dib with pertinent past medical history of above including ESRD, chf  which further complicates the presenting complaint. The complaint involves an extensive differential diagnosis and also carries with it a high risk of complications and morbidity.    In my evaluation of this patient's dyspnea my DDx includes, but is not limited to, pneumonia, pulmonary embolism, pneumothorax, pulmonary edema, metabolic acidosis, asthma, COPD, cardiac cause, anemia, anxiety, etc.  Differential diagnosis for adult fever includes but is not exclusive to community-acquired pneumonia, urinary tract infection, acute cholecystitis, viral syndrome, cellulitis, tick bourne disease,  decubitus ulcer, necrotizing fasciitis, meningitis, encephalitis, influenza, etc.   . Serious etiologies were considered.   The initial plan is to sepsis bundle, give rocephin for presumed pulmonary infection, continue supplemental oxygen   Additional history obtained: Additional history obtained from  na Records reviewed [place] and prior admission, home meds, prior labs   prior imaging   Independent labs interpretation:  The following labs were independently interpreted:  No  leukocytosis, Hgb is simialr to his baseline Cr similar to baseline in setting of ESRD, K is not elev >> had HD today UA w/o infection LA not elevated CXR c/w dialysis    Independent visualization of imaging: - I independently visualized the following imaging with scope of interpretation limited to determining acute life threatening conditions related to emergency care: CXR, which revealed pulmonary edema , similar to prior, ESRD on hd  Cardiac monitoring was reviewed and interpreted by myself which shows sinus tachy >> nsr  Treatment and Reassessment: Rocephin initially  Solumedrol Tylenol >>> improved  Consultation: - Consulted or discussed management/test interpretation w/ external professional: na  Consideration for admission or further workup: Admission was considered   He is feeling much better, able to titrate his O2 down to 4LPM, now afebrile, labs reviewed and are stable. Pt is at facility and has access to oxygen, reasonable to send him home given home oxygen supply increased in setting  of covid 19 infection. Will start paxlovid/steroids, discussed strict return precautions w/ patient. Obs was offered but he prefers to trial o/p management at this time, will RTED if symptoms worsen, particularly in reference to his respiratory status  Pt did meet for sepsis criteria initially w/ undiff infection, found to have covid 19, no PNA or UTI. Recent exposure to sick contact w/ covid 19 at facility/roommate. This is likely source, will not activate code sepsis.   The patient improved significantly and was discharged in stable condition. Detailed discussions were had with the patient regarding current findings, and need for close f/u with PCP or on call doctor. The patient has been instructed to return immediately if the symptoms worsen in any way for re-evaluation. Patient verbalized understanding and is in agreement with current care plan. All questions answered prior to  discharge.    Social Determinants of health: Social History   Tobacco Use   Smoking status: Former    Packs/day: 0.00    Types: Cigarettes    Quit date: 03/22/2015    Years since quitting: 7.1   Smokeless tobacco: Never  Vaping Use   Vaping Use: Never used  Substance Use Topics   Alcohol use: Not Currently   Drug use: No            Final Clinical Impression(s) / ED Diagnoses Final diagnoses:  ESRD on dialysis ([place])  COVID-19  Dyspnea, unspecified type    Rx / DC Orders ED Discharge Orders          Ordered    nirmatrelvir/ritonavir EUA, renal dosing, (PAXLOVID) 10 x 150 MG & 10 x 100MG  TABS  2 times daily        05/07/22 1843    dexamethasone (DECADRON) 6 MG tablet  Daily        05/07/22 1843              Jeanell Sparrow, DO 05/07/22 1913

## 2022-05-07 NOTE — ED Provider Triage Note (Signed)
Emergency Medicine Provider Triage Evaluation Note  Christopher Davila , a 51 y.o. male  was evaluated in triage.  Pt complains of neck pain, back pain, shortness of breath since dialysis this morning. Endorses body aches and not feeling well for few days.  Patient wears 2 L oxygen normally.  No chest pain, nausea, vomiting, bowel changes, urinary symptoms.  Review of Systems  Positive: As above Negative: As above  Physical Exam  BP (!) 105/94   Pulse (!) 108   Temp (!) 101.1 F (38.4 C) (Oral)   Resp 18   Ht 5\' 3"  (1.6 m)   Wt 60.8 kg   SpO2 91%   BMI 23.74 kg/m  Gen:   Awake, no distress   Resp:  Normal effort  MSK:   Moves extremities without difficulty  Other:    Medical Decision Making  Medically screening exam initiated at 3:32 PM.  Appropriate orders placed.  Christopher Davila was informed that the remainder of the evaluation will be completed by another provider, this initial triage assessment does not replace that evaluation, and the importance of remaining in the ED until their evaluation is complete.     Rex Kras, [place] 05/07/22 2101

## 2022-05-07 NOTE — ED Triage Notes (Signed)
Pt arrives via EMS from dialysis where he had 1 hour left. Endorses body aches and not feeling well for a few days. Pt wears 2L O2 normally, O2 sats 77% on 2L.

## 2022-05-07 NOTE — Discharge Instructions (Addendum)
It was a pleasure caring for you today in the emergency department.    Please return to the ED if your breathing becomes worse, unable to tolerate food/drink by mouth or any worsening or worrisome symptoms.

## 2022-05-07 NOTE — ED Notes (Signed)
Report called and no answer. Left VM for them to return call with direct phone number.

## 2022-05-07 NOTE — ED Notes (Signed)
[facility] called for transport.  

## 2022-05-12 LAB — CULTURE, BLOOD (ROUTINE X 2)
Culture: NO GROWTH
Special Requests: ADEQUATE

## 2022-05-24 ENCOUNTER — Non-Acute Institutional Stay: Payer: Self-pay | Admitting: Hospice

## 2022-05-24 DIAGNOSIS — N186 End stage renal disease: Secondary | ICD-10-CM

## 2022-05-24 DIAGNOSIS — G4700 Insomnia, unspecified: Secondary | ICD-10-CM

## 2022-05-24 DIAGNOSIS — R531 Weakness: Secondary | ICD-10-CM

## 2022-05-24 DIAGNOSIS — Z515 Encounter for palliative care: Secondary | ICD-10-CM

## 2022-05-24 NOTE — Progress Notes (Signed)
[facility] Consult Note Telephone: [phone]  Fax: [phone]  PATIENT NAME: Christopher Davila [place] [address] [phone] (home)  DOB: May 14, 1971 MRN: [id number]  PRIMARY CARE PROVIDER:    [name], MD,  [place] [address] [phone]  REFERRING PROVIDER:   [name], MD [address] [place],  [place] [address] [phone]  RESPONSIBLE PARTY:   Self Emergency contact - cousin - [place]     Name Relation Home Work Mobile   [place] Relative   [phone]   Arturo, Sofranko Relative [phone]          I met face to face with patient at facility. Palliative Care was asked to follow this patient by consultation request of  [name], MD to address advance care planning, complex medical decision making and goals of care clarification. Patient seen before going for dialysis. ASSESSMENT AND / RECOMMENDATIONS:   CODE STATUS: Patient affirmed he is a Full code  Goals of Care: Goals include to maximize quality of life and symptom management  Symptom Management/Plan: Weakness:  PT OT was complated.  Patient is now ambulatory with rolling walker short distance. Balance of rest and performance activity. Fall precautions reiterated. ESRD: Managed with Dialysis Mon Wed Friday, well tolerated CHF: Continue on Torsemide and metolazone as ordered. Report weight gain of 3 Ibs in a day or 5 pounds in a week. Fluid restriction 1500cc, no added salt.  Depression: Continue Zoloft.  Encourage socialization and participation in facility activities.  Psych consult as planned.  Insomnia: Sleep routine encouraged. Continue melatonin 10 mg at bedtime. Follow up: Palliative care will continue to follow for complex medical decision making, advance care planning, and clarification of goals. Return 6 weeks or prn.Encouraged to call provider sooner with any  concerns.   Family /Caregiver/Community Supports: Patient in SNF for ongoing care  HOSPICE ELIGIBILITY/DIAGNOSIS: TBD  Chief Complaint: Follow-up visit  HISTORY OF PRESENT ILLNESS:  Christopher Davila is a 51 y.o. year old male  with multiple medical conditions including generalized weakness, end-stage renal disease on dialysis, CHF with preserved ejection fraction, chronic respiratory failure with hypoxia, hypertension, low left shoulder osteoarthritis, depression.  History obtained from review of EMR, discussion with primary team, caregiver, family and/or Mr. Obryan.  Review and summarization of Epic records shows history from other than patient. Rest of 10 point ROS asked and negative.  I reviewed, as needed, available labs, patient records, imaging, studies and related documents from the EMR.  PAST MEDICAL HISTORY:  Active Ambulatory Problems    Diagnosis Date Noted   Type 2 diabetes mellitus with hypoglycemia ([place]) 06/26/2012   Essential hypertension    Hyperlipidemia    Gastroparesis due to DM [facility])    Alcohol use    Acute loss of vision, right 06/16/2019   Homeless 09/19/2019   Anemia of chronic disease 09/19/2019   Vision loss, left eye 09/19/2019   Vision loss of left eye 09/21/2019   Weakness 10/05/2019   ESRD on dialysis [facility])    Heart failure with preserved ejection fraction ([place])    Goals of care, counseling/discussion    Pressure injury of skin 11/06/2019   History of thoracentesis    End-stage renal disease on hemodialysis ([place])    Pain due to onychomycosis of toenails of both feet 07/31/2021   Skin lesion 07/31/2021   Generalized weakness 11/05/2021   Resolved Ambulatory Problems    Diagnosis Date Noted  Hypertension 06/26/2012   Pancreatitis 06/26/2012   Weight loss 06/26/2012   Oral candidiasis 06/26/2012   Abdominal pain 12/28/2012   Chest pain 02/14/2014   Nausea    Dyspnea    Heart failure ([place]) 02/13/2019   Epigastric abdominal pain    Hypoxia     Wheezing    CKD (chronic kidney disease) stage 4, GFR 15-29 ml/min (HCC)    Acute kidney injury superimposed on chronic kidney disease ([place])    Acute renal failure superimposed on stage 3 chronic kidney disease ([place]) 04/17/2019   Hypertensive urgency 04/17/2019   Hypoalbuminemia 04/17/2019   Acute kidney injury superimposed on chronic kidney disease ([place]) 09/19/2019   CHF (congestive heart failure) ([place]) 09/26/2019   Acute on chronic diastolic CHF (congestive heart failure) ([place]) 66/11/3014   Metabolic acidosis, increased anion gap 10/13/2019   Acute urinary retention 10/13/2019   Palliative care encounter    Acute respiratory failure with hypoxia ([place]) 03/26/2021   COVID-19    Hypervolemia    Multifocal pneumonia 05/06/2021   Past Medical History:  Diagnosis Date   Anemia    Blindness 09/04/2019   Diabetes mellitus    Diarrhea 10/14/2019   ESRD (end stage renal disease) ([place])    Neuropathy of lower extremity     SOCIAL HX:  Social History   Tobacco Use   Smoking status: Former    Packs/day: 0.00    Types: Cigarettes    Quit date: 03/22/2015    Years since quitting: 6.6   Smokeless tobacco: Never  Substance Use Topics   Alcohol use: Not Currently     FAMILY HX:  Family History  Problem Relation Age of Onset   Diabetes Mother    Lung disease Mother    Hypertension Mother    Diabetes Maternal Aunt    CAD Maternal Aunt    CAD Cousin       ALLERGIES:  Allergies  Allergen Reactions   Morphine And Related Itching and Other (See Comments)    Pt prefers not to be given this drug      PERTINENT MEDICATIONS:  Outpatient Encounter Medications as of 11/27/2021  Medication Sig   albuterol (VENTOLIN HFA) 108 (90 Base) MCG/ACT inhaler Inhale 2 puffs into the lungs every 4 (four) hours as needed for shortness of breath.   amLODipine (NORVASC) 10 MG tablet Take 0.5 tablets (5 mg total) by mouth daily. (Patient not taking: Reported on 11/06/2021)   aspirin EC 81 MG tablet  Take 1 tablet (81 mg total) by mouth daily.   atorvastatin (LIPITOR) 40 MG tablet Take 1 tablet (40 mg total) by mouth daily.   Atropine Sulfate 0.01 % SOLN Place 1 drop into both eyes in the morning and at bedtime.   brimonidine (ALPHAGAN P) 0.1 % SOLN Place 1 drop into the left eye in the morning and at bedtime.   calcium acetate (PHOSLO) 667 MG capsule Take 1 capsule (667 mg total) by mouth 3 (three) times daily with meals. (Patient taking differently: Take 1,334 mg by mouth 3 (three) times daily with meals.)   capsaicin (ZOSTRIX) 0.025 % cream Apply 1 application. topically every 8 (eight) hours as needed (pain).   Dextran 70-Hypromellose (TEARS PURE) 0.1-0.3 % SOLN Place 1 drop into both eyes in the morning, at noon, in the evening, and at bedtime.   dorzolamide-timolol (COSOPT) 22.3-6.8 MG/ML ophthalmic solution Place 1 drop into the left eye 2 (two) times daily.   ferrous sulfate 325 (65 FE) MG tablet Take  325 mg by mouth daily. (Patient not taking: Reported on 11/06/2021)   guaiFENesin (ROBITUSSIN) 100 MG/5ML liquid Take 100 mg by mouth every 4 (four) hours as needed for cough.   hydrALAZINE (APRESOLINE) 25 MG tablet Take 37.5 mg by mouth 3 (three) times daily.   ipratropium-albuterol (DUONEB) 0.5-2.5 (3) MG/3ML SOLN Take 3 mLs by nebulization every 6 (six) hours as needed (shortness of breath or wheezing).   latanoprost (XALATAN) 0.005 % ophthalmic solution Place 1 drop into the left eye at bedtime.   losartan (COZAAR) 100 MG tablet Take 1 tablet (100 mg total) by mouth daily.   Melatonin 10 MG CAPS Take 10 mg by mouth at bedtime.   Menthol, Topical Analgesic, 5 % GEL Apply 1 application. topically every 8 (eight) hours as needed (hand/wrist pain).   Nutritional Supplements (RESOURCE 2.0 PO) Take 120 mLs by mouth in the morning and at bedtime.   olopatadine (PATANOL) 0.1 % ophthalmic solution Place 2 drops into both eyes daily.   ondansetron (ZOFRAN) 4 MG tablet Take 4 mg by mouth every 6  (six) hours as needed for nausea or vomiting.   oxyCODONE-acetaminophen (PERCOCET/ROXICET) 5-325 MG tablet Take 1 tablet by mouth See admin instructions. 1 tablet once daily every Tuesday, Thursday, Saturday, and Sunday. May take an additional 1 tablet every 6 hours as needed on Monday's Wednesday's, and Friday's.   OXYGEN Inhale 3 L into the lungs continuous.   polyethylene glycol (MIRALAX / GLYCOLAX) 17 g packet Take 17 g by mouth See admin instructions. Every three days.  May take an additional 17g daily as needed for constipation.   pramoxine-zinc acetate (CALADRYL) 1-0.1 % LOTN Apply 1 application. topically every 8 (eight) hours as needed (back, torso, arms, and legs for puritus).   prednisoLONE acetate (PRED FORTE) 1 % ophthalmic suspension Place 1 drop into both eyes 4 (four) times daily.   pregabalin (LYRICA) 25 MG capsule Take 1 capsule (25 mg total) by mouth daily.   senna-docusate (SENOKOT-S) 8.6-50 MG tablet Take 2 tablets by mouth 2 (two) times daily.   torsemide (DEMADEX) 100 MG tablet Take 100 mg by mouth 2 (two) times daily.   vitamin B-12 1000 MCG tablet Take 1 tablet (1,000 mcg total) by mouth daily.   Vitamin D, Ergocalciferol, (DRISDOL) 1.25 MG (50000 UNIT) CAPS capsule Take 50,000 Units by mouth every Sunday.   No facility-administered encounter medications on file as of 11/27/2021.   I spent 45 minutes providing this consultation; time includes time spent with patient/family, chart review and documentation.  More than 50% of the time in this consultation was spent on care coordination/communication.  Thank you for the opportunity to participate in the care of Mr. Ferger.  The palliative care team will continue to follow. Please call our office at [phone] if we can be of additional assistance.   Note: Portions of this note were generated with [employer]. Dictation errors may occur despite best attempts at proofreading.  Teodoro Spray, NP

## 2022-05-27 ENCOUNTER — Ambulatory Visit: Payer: Medicare Other | Admitting: Podiatry

## 2022-06-04 ENCOUNTER — Ambulatory Visit: Payer: Medicare Other | Admitting: Podiatry

## 2022-06-06 ENCOUNTER — Ambulatory Visit (INDEPENDENT_AMBULATORY_CARE_PROVIDER_SITE_OTHER): Payer: Medicare Other | Admitting: Podiatry

## 2022-06-06 DIAGNOSIS — M79675 Pain in left toe(s): Secondary | ICD-10-CM | POA: Diagnosis not present

## 2022-06-06 DIAGNOSIS — M79674 Pain in right toe(s): Secondary | ICD-10-CM | POA: Diagnosis not present

## 2022-06-06 DIAGNOSIS — B351 Tinea unguium: Secondary | ICD-10-CM | POA: Diagnosis not present

## 2022-06-06 DIAGNOSIS — E11649 Type 2 diabetes mellitus with hypoglycemia without coma: Secondary | ICD-10-CM

## 2022-06-12 ENCOUNTER — Encounter (HOSPITAL_COMMUNITY): Payer: Self-pay

## 2022-06-12 ENCOUNTER — Other Ambulatory Visit: Payer: Self-pay

## 2022-06-12 ENCOUNTER — Emergency Department (HOSPITAL_COMMUNITY): Payer: Medicare Other

## 2022-06-12 ENCOUNTER — Inpatient Hospital Stay (HOSPITAL_COMMUNITY)
Admission: EM | Admit: 2022-06-12 | Discharge: 2022-06-15 | DRG: 193 | Disposition: A | Discharge status: Skilled Nursing Facility | Payer: Medicare Other | Source: Skilled Nursing Facility | Attending: Internal Medicine | Admitting: Internal Medicine

## 2022-06-12 DIAGNOSIS — R5381 Other malaise: Secondary | ICD-10-CM | POA: Diagnosis present

## 2022-06-12 DIAGNOSIS — Z87891 Personal history of nicotine dependence: Secondary | ICD-10-CM

## 2022-06-12 DIAGNOSIS — F1090 Alcohol use, unspecified, uncomplicated: Secondary | ICD-10-CM | POA: Diagnosis present

## 2022-06-12 DIAGNOSIS — Z8249 Family history of ischemic heart disease and other diseases of the circulatory system: Secondary | ICD-10-CM

## 2022-06-12 DIAGNOSIS — E11649 Type 2 diabetes mellitus with hypoglycemia without coma: Secondary | ICD-10-CM | POA: Diagnosis present

## 2022-06-12 DIAGNOSIS — G928 Other toxic encephalopathy: Secondary | ICD-10-CM | POA: Diagnosis present

## 2022-06-12 DIAGNOSIS — D631 Anemia in chronic kidney disease: Secondary | ICD-10-CM | POA: Diagnosis present

## 2022-06-12 DIAGNOSIS — Z79899 Other long term (current) drug therapy: Secondary | ICD-10-CM

## 2022-06-12 DIAGNOSIS — I5030 Unspecified diastolic (congestive) heart failure: Secondary | ICD-10-CM | POA: Diagnosis present

## 2022-06-12 DIAGNOSIS — Z8616 Personal history of COVID-19: Secondary | ICD-10-CM

## 2022-06-12 DIAGNOSIS — J101 Influenza due to other identified influenza virus with other respiratory manifestations: Principal | ICD-10-CM | POA: Diagnosis present

## 2022-06-12 DIAGNOSIS — J9611 Chronic respiratory failure with hypoxia: Secondary | ICD-10-CM | POA: Diagnosis not present

## 2022-06-12 DIAGNOSIS — G9341 Metabolic encephalopathy: Secondary | ICD-10-CM | POA: Diagnosis present

## 2022-06-12 DIAGNOSIS — H543 Unqualified visual loss, both eyes: Secondary | ICD-10-CM | POA: Diagnosis present

## 2022-06-12 DIAGNOSIS — N186 End stage renal disease: Secondary | ICD-10-CM | POA: Diagnosis present

## 2022-06-12 DIAGNOSIS — Z9889 Other specified postprocedural states: Secondary | ICD-10-CM

## 2022-06-12 DIAGNOSIS — Z9981 Dependence on supplemental oxygen: Secondary | ICD-10-CM

## 2022-06-12 DIAGNOSIS — G9340 Encephalopathy, unspecified: Secondary | ICD-10-CM | POA: Diagnosis present

## 2022-06-12 DIAGNOSIS — Z833 Family history of diabetes mellitus: Secondary | ICD-10-CM

## 2022-06-12 DIAGNOSIS — E1143 Type 2 diabetes mellitus with diabetic autonomic (poly)neuropathy: Secondary | ICD-10-CM | POA: Diagnosis present

## 2022-06-12 DIAGNOSIS — Z789 Other specified health status: Secondary | ICD-10-CM | POA: Diagnosis present

## 2022-06-12 DIAGNOSIS — K3184 Gastroparesis: Secondary | ICD-10-CM | POA: Diagnosis present

## 2022-06-12 DIAGNOSIS — E875 Hyperkalemia: Secondary | ICD-10-CM | POA: Diagnosis not present

## 2022-06-12 DIAGNOSIS — E1122 Type 2 diabetes mellitus with diabetic chronic kidney disease: Secondary | ICD-10-CM | POA: Diagnosis present

## 2022-06-12 DIAGNOSIS — I132 Hypertensive heart and chronic kidney disease with heart failure and with stage 5 chronic kidney disease, or end stage renal disease: Secondary | ICD-10-CM | POA: Diagnosis present

## 2022-06-12 DIAGNOSIS — E785 Hyperlipidemia, unspecified: Secondary | ICD-10-CM | POA: Diagnosis present

## 2022-06-12 DIAGNOSIS — Z992 Dependence on renal dialysis: Secondary | ICD-10-CM

## 2022-06-12 DIAGNOSIS — G8929 Other chronic pain: Secondary | ICD-10-CM | POA: Diagnosis present

## 2022-06-12 DIAGNOSIS — I10 Essential (primary) hypertension: Secondary | ICD-10-CM | POA: Diagnosis present

## 2022-06-12 DIAGNOSIS — J90 Pleural effusion, not elsewhere classified: Secondary | ICD-10-CM | POA: Diagnosis present

## 2022-06-12 DIAGNOSIS — I5032 Chronic diastolic (congestive) heart failure: Secondary | ICD-10-CM | POA: Diagnosis present

## 2022-06-12 DIAGNOSIS — Z7982 Long term (current) use of aspirin: Secondary | ICD-10-CM

## 2022-06-12 LAB — COMPREHENSIVE METABOLIC PANEL
ALT: 35 U/L (ref 0–44)
AST: 39 U/L (ref 15–41)
Albumin: 3.5 g/dL (ref 3.5–5.0)
Alkaline Phosphatase: 99 U/L (ref 38–126)
Anion gap: 20 — ABNORMAL HIGH (ref 5–15)
BUN: 83 mg/dL — ABNORMAL HIGH (ref 6–20)
CO2: 22 mmol/L (ref 22–32)
Calcium: 8.8 mg/dL — ABNORMAL LOW (ref 8.9–10.3)
Chloride: 93 mmol/L — ABNORMAL LOW (ref 98–111)
Creatinine, Ser: 10.31 mg/dL — ABNORMAL HIGH (ref 0.61–1.24)
GFR, Estimated: 6 mL/min — ABNORMAL LOW (ref >60)
Glucose, Bld: 119 mg/dL — ABNORMAL HIGH (ref 70–99)
Potassium: 5.9 mmol/L — ABNORMAL HIGH (ref 3.5–5.1)
Sodium: 135 mmol/L (ref 135–145)
Total Bilirubin: 1.1 mg/dL (ref 0.3–1.2)
Total Protein: 7.3 g/dL (ref 6.5–8.1)

## 2022-06-12 LAB — I-STAT VENOUS BLOOD GAS, ED
Acid-Base Excess: 0 mmol/L (ref 0.0–2.0)
Bicarbonate: 24.4 mmol/L (ref 20.0–28.0)
Calcium, Ion: 1.01 mmol/L — ABNORMAL LOW (ref 1.15–1.40)
HCT: 31 % — ABNORMAL LOW (ref 39.0–52.0)
Hemoglobin: 10.5 g/dL — ABNORMAL LOW (ref 13.0–17.0)
O2 Saturation: 81 %
Potassium: 5.7 mmol/L — ABNORMAL HIGH (ref 3.5–5.1)
Sodium: 133 mmol/L — ABNORMAL LOW (ref 135–145)
TCO2: 26 mmol/L (ref 22–32)
pCO2, Ven: 36.6 mmHg — ABNORMAL LOW (ref 44–60)
pH, Ven: 7.432 — ABNORMAL HIGH (ref 7.25–7.43)
pO2, Ven: 43 mmHg (ref 32–45)

## 2022-06-12 LAB — CBC WITH DIFFERENTIAL/PLATELET
Abs Immature Granulocytes: 0.05 10*3/uL (ref 0.00–0.07)
Basophils Absolute: 0 10*3/uL (ref 0.0–0.1)
Basophils Relative: 0 %
Eosinophils Absolute: 0 10*3/uL (ref 0.0–0.5)
Eosinophils Relative: 0 %
HCT: 30.3 % — ABNORMAL LOW (ref 39.0–52.0)
Hemoglobin: 9.7 g/dL — ABNORMAL LOW (ref 13.0–17.0)
Immature Granulocytes: 1 %
Lymphocytes Relative: 8 %
Lymphs Abs: 0.8 10*3/uL (ref 0.7–4.0)
MCH: 30.5 pg (ref 26.0–34.0)
MCHC: 32 g/dL (ref 30.0–36.0)
MCV: 95.3 fL (ref 80.0–100.0)
Monocytes Absolute: 1.7 10*3/uL — ABNORMAL HIGH (ref 0.1–1.0)
Monocytes Relative: 16 %
Neutro Abs: 7.8 10*3/uL — ABNORMAL HIGH (ref 1.7–7.7)
Neutrophils Relative %: 75 %
Platelets: 144 10*3/uL — ABNORMAL LOW (ref 150–400)
RBC: 3.18 MIL/uL — ABNORMAL LOW (ref 4.22–5.81)
RDW: 16.1 % — ABNORMAL HIGH (ref 11.5–15.5)
WBC: 10.5 10*3/uL (ref 4.0–10.5)
nRBC: 0 % (ref 0.0–0.2)

## 2022-06-12 LAB — I-STAT CHEM 8, ED
BUN: 79 mg/dL — ABNORMAL HIGH (ref 6–20)
Calcium, Ion: 1.02 mmol/L — ABNORMAL LOW (ref 1.15–1.40)
Chloride: 97 mmol/L — ABNORMAL LOW (ref 98–111)
Creatinine, Ser: 11.5 mg/dL — ABNORMAL HIGH (ref 0.61–1.24)
Glucose, Bld: 117 mg/dL — ABNORMAL HIGH (ref 70–99)
HCT: 33 % — ABNORMAL LOW (ref 39.0–52.0)
Hemoglobin: 11.2 g/dL — ABNORMAL LOW (ref 13.0–17.0)
Potassium: 5.7 mmol/L — ABNORMAL HIGH (ref 3.5–5.1)
Sodium: 133 mmol/L — ABNORMAL LOW (ref 135–145)
TCO2: 23 mmol/L (ref 22–32)

## 2022-06-12 LAB — AMMONIA: Ammonia: 29 umol/L (ref 9–35)

## 2022-06-12 MED ORDER — SODIUM ZIRCONIUM CYCLOSILICATE 5 G PO PACK
5.0000 g | PACK | Freq: Every day | ORAL | Status: DC
  Administered 2022-06-14 – 2022-06-15 (×2): 5 g via ORAL
  Filled 2022-06-12 (×5): qty 1

## 2022-06-12 MED ORDER — CHLORHEXIDINE GLUCONATE CLOTH 2 % EX PADS
6.0000 | MEDICATED_PAD | Freq: Every day | CUTANEOUS | Status: DC
  Administered 2022-06-13 – 2022-06-14 (×2): 6 via TOPICAL

## 2022-06-12 MED ORDER — ACETAMINOPHEN 650 MG RE SUPP
650.0000 mg | Freq: Once | RECTAL | Status: AC
  Administered 2022-06-12: 650 mg via RECTAL
  Filled 2022-06-12: qty 1

## 2022-06-12 MED ORDER — ACETAMINOPHEN 80 MG RE SUPP
80.0000 mg | Freq: Once | RECTAL | Status: DC
  Filled 2022-06-12: qty 1

## 2022-06-12 NOTE — H&P (Incomplete)
PCP:   [name], MD   Chief Complaint: Acute metabolic encephalopathy   HPI: This is a 51 year old male nursing home resident.  He has a history of ESRD, HTN, chronic diastolic CHF, chronic respiratory failure 2 L, blindness, gastroparesis and peripheral neuropathy.  He is on hemodialysis Monday/Wednesday/Friday.  Today he was sent for hemodialysis, this was not done because he was not feeling well.  At the facility patient was confused, nonverbal, noncommunicative.  At baseline patient is alert and oriented x 3.  His Tmax was 100.  Patient is on hospice, hospice was contacted, after evaluation it was recommended he present to the ER.  In the ER patient remains encephalopathic.  Influenza screening is positive.   Review of Systems: Unable to obtain due to metabolic encephalopathy  Past Medical History: Past Medical History:  Diagnosis Date   Acute on chronic diastolic CHF (congestive heart failure) ([place]) 09/27/2019   Acute respiratory failure with hypoxia (HCC) 03/26/2021   Acute urinary retention 10/13/2019   Anemia    Blindness 09/04/2019   CHF (congestive heart failure) ([place])    COVID-19    Diabetes mellitus    Diarrhea 10/14/2019   Epigastric abdominal pain    ESRD (end stage renal disease) ([place])    Gastroparesis due to DM ([place])    Hypertension    Hypertensive urgency 04/17/2019   Hypervolemia    Hypoalbuminemia 04/17/2019   Hypoxia    Metabolic acidosis, increased anion gap 10/13/2019   Multifocal pneumonia 05/06/2021   Neuropathy of lower extremity    Oral candidiasis 06/26/2012   Palliative care encounter    Pancreatitis 06/26/2012   Weight loss 06/26/2012   Past Surgical History:  Procedure Laterality Date   AV FISTULA PLACEMENT Right 10/29/2019   Procedure: RIGHT ARM BRACHIOBASILIC ARTERIOVENOUS (AV) FISTULA CREATION;  Surgeon: Rosetta Posner, MD;  Location: [facility] OR;  Service: Vascular;  Laterality: Right;   [place]uck Right 01/05/2020    Procedure: RIGHT ARM SECOND STAGE [place];  Surgeon: Rosetta Posner, MD;  Location: [facility] OR;  Service: Vascular;  Laterality: Right;   EYE SURGERY     IR FLUORO GUIDE CV LINE RIGHT  10/28/2019   IR US GUIDE VASC ACCESS RIGHT  10/28/2019    Medications: Prior to Admission medications   Medication Sig Start Date End Date Taking? Authorizing Provider  albuterol (VENTOLIN HFA) 108 (90 Base) MCG/ACT inhaler Inhale 2 puffs into the lungs every 4 (four) hours as needed for shortness of breath.    [provider]  amLODipine (NORVASC) 10 MG tablet Take 0.5 tablets (5 mg total) by mouth daily. 05/10/21   Nita Sells, MD  aspirin EC 81 MG tablet Take 1 tablet (81 mg total) by mouth daily. 05/24/15   Ghimire, Henreitta Leber, MD  atorvastatin (LIPITOR) 40 MG tablet Take 1 tablet (40 mg total) by mouth daily. 12/05/20   [place], Peter M, MD  Atropine Sulfate 0.01 % SOLN Place 1 drop into both eyes in the morning and at bedtime. 04/24/21   [provider]  brimonidine (ALPHAGAN P) 0.1 % SOLN Place 1 drop into the left eye in the morning and at bedtime.    [provider]  calcium acetate (PHOSLO) 667 MG capsule Take 1 capsule (667 mg total) by mouth 3 (three) times daily with meals. Patient taking differently: Take 1,334 mg by mouth 3 (three) times daily with meals. 11/19/19   Charlynne Cousins, MD  capsaicin (ZOSTRIX) 0.025 % cream  Apply 1 application. topically every 8 (eight) hours as needed (pain).    [provider]  Dextran 70-Hypromellose (TEARS PURE) 0.1-0.3 % SOLN Place 1 drop into both eyes in the morning, at noon, in the evening, and at bedtime.    [provider]  dorzolamide-timolol (COSOPT) 22.3-6.8 MG/ML ophthalmic solution Place 1 drop into the left eye 2 (two) times daily. 09/23/19   Geradine Girt, DO  ferrous sulfate 325 (65 FE) MG tablet Take 325 mg by mouth daily. 08/25/20   [provider]  guaiFENesin (ROBITUSSIN) 100  MG/5ML liquid Take 100 mg by mouth every 4 (four) hours as needed for cough.    [provider]  hydrALAZINE (APRESOLINE) 25 MG tablet Take 37.5 mg by mouth 3 (three) times daily.    [provider]  ipratropium-albuterol (DUONEB) 0.5-2.5 (3) MG/3ML SOLN Take 3 mLs by nebulization every 6 (six) hours as needed (shortness of breath or wheezing).    [provider]  latanoprost (XALATAN) 0.005 % ophthalmic solution Place 1 drop into the left eye at bedtime. 09/23/19   Geradine Girt, DO  losartan (COZAAR) 100 MG tablet Take 1 tablet (100 mg total) by mouth daily. 11/19/19   Charlynne Cousins, MD  Melatonin 10 MG CAPS Take 10 mg by mouth at bedtime.    [provider]  Menthol, Topical Analgesic, 5 % GEL Apply 1 application. topically every 8 (eight) hours as needed (hand/wrist pain).    [provider]  Nutritional Supplements (RESOURCE 2.0 PO) Take 120 mLs by mouth in the morning and at bedtime.    [provider]  olopatadine (PATANOL) 0.1 % ophthalmic solution Place 2 drops into both eyes daily.    [provider]  ondansetron (ZOFRAN) 4 MG tablet Take 4 mg by mouth every 6 (six) hours as needed for nausea or vomiting.    [provider]  OXYGEN Inhale 3 L into the lungs continuous.    [provider]  polyethylene glycol (MIRALAX / GLYCOLAX) 17 g packet Take 17 g by mouth See admin instructions. Every three days.  May take an additional 17g daily as needed for constipation.    [provider]  pramoxine-zinc acetate (CALADRYL) 1-0.1 % LOTN Apply 1 application. topically every 8 (eight) hours as needed (back, torso, arms, and legs for puritus).    [provider]  prednisoLONE acetate (PRED FORTE) 1 % ophthalmic suspension Place 1 drop into both eyes 4 (four) times daily.    [provider]  pregabalin (LYRICA) 25 MG capsule Take 1 capsule (25 mg total) by mouth daily. 11/07/21   Mariel Aloe,  MD  senna-docusate (SENOKOT-S) 8.6-50 MG tablet Take 2 tablets by mouth 2 (two) times daily.    [provider]  torsemide (DEMADEX) 100 MG tablet Take 100 mg by mouth 2 (two) times daily.    [provider]  vitamin B-12 1000 MCG tablet Take 1 tablet (1,000 mcg total) by mouth daily. 05/11/21   Nita Sells, MD  Vitamin D, Ergocalciferol, (DRISDOL) 1.25 MG (50000 UNIT) CAPS capsule Take 50,000 Units by mouth every Sunday.    [provider]    Allergies:   Allergies  Allergen Reactions   Morphine And Related Itching and Other (See Comments)    Pt prefers not to be given this drug    Social History:  reports that he quit smoking about 7 years ago. His smoking use included cigarettes. He has never used smokeless  tobacco. He reports that he does not currently use alcohol. He reports that he does not use drugs.  Family History: Family History  Problem Relation Age of Onset   Diabetes Mother    Lung disease Mother    Hypertension Mother    Diabetes Maternal Aunt    CAD Maternal Aunt    CAD Cousin     Physical Exam: Vitals:   06/12/22 2130 06/12/22 2200 06/12/22 2230 06/12/22 2300  BP: 123/82 122/78 126/76 137/82  Pulse: 86 84 81 84  Resp: 18 18 18 19   Temp:      TempSrc:      SpO2: 93% 92% 94% 95%    General: Arousable, noncommunicative,, encephalopathic patient, well developed, no acute distress Eyes: Bilateral cataracts, opaque ENT: Dry oral mucosa, neck supple, no thyromegaly Lungs: clear to ascultation, no wheeze, no crackles, no use of accessory muscles Cardiovascular: regular rate and rhythm, no regurgitation, no gallops, no murmurs. No carotid bruits, no JVD Abdomen: soft, positive BS, non-tender, non-distended, no organomegaly, not an acute abdomen GU: not examined Neuro: Unable to accurately evaluate due to patient's encephalopathy Musculoskeletal: Moves all extremity, no clubbing, cyanosis or edema Skin: no rash, no  subcutaneous crepitation, no decubitus Psych: Encephalopathic patient   Labs on Admission:  Recent Labs    06/12/22 2108 06/12/22 2121  NA 133*  133* 135  K 5.7*  5.7* 5.9*  CL 97* 93*  CO2  --  22  GLUCOSE 117* 119*  BUN 79* 83*  CREATININE 11.50* 10.31*  CALCIUM  --  8.8*   Recent Labs    06/12/22 2121  AST 39  ALT 35  ALKPHOS 99  BILITOT 1.1  PROT 7.3  ALBUMIN 3.5    Recent Labs    06/12/22 2108 06/12/22 2125  WBC  --  10.5  NEUTROABS  --  7.8*  HGB 11.2*  10.5* 9.7*  HCT 33.0*  31.0* 30.3*  MCV  --  95.3  PLT  --  144*      Radiological Exams on Admission: DG Chest Portable 1 View  Result Date: 06/12/2022 CLINICAL DATA:  Shortness of breath. EXAM: PORTABLE CHEST 1 VIEW COMPARISON:  Chest radiograph dated May 07, 2022 FINDINGS: The heart is enlarged. Pulmonary vascular congestion. Low lung volumes with bilateral lower lobe opacity suggesting moderate bilateral pleural effusions, right greater than the left. No acute osseous abnormality. IMPRESSION: 1. Cardiomegaly with pulmonary vascular congestion. 2. Moderate bilateral pleural effusions, right greater than left, findings likely sequela of congestive heart failure. Clinical correlation is suggested. Electronically Signed   By: Keane Police D.O.   On: 06/12/2022 21:54   CT HEAD WO CONTRAST  Result Date: 06/12/2022 CLINICAL DATA:  Dialysis patient with neuro deficit. Stroke suspected. EXAM: CT HEAD WITHOUT CONTRAST TECHNIQUE: Contiguous axial images were obtained from the base of the skull through the vertex without intravenous contrast. RADIATION DOSE REDUCTION: This exam was performed according to the departmental dose-optimization program which includes automated exposure control, adjustment of the mA and/or kV according to patient size and/or use of iterative reconstruction technique. COMPARISON:  Head CT 11/05/2021 FINDINGS: Brain: Again noted are mild cerebral atrophy and atrophic ventriculomegaly  and moderately advanced, age-advanced small-vessel disease of the cerebral white matter. There is only slight cerebellar atrophy. No new asymmetry is seen worrisome for an acute infarct, hemorrhage or mass. There are benign dural calcifications scattered along the falx. No midline shift is seen. Basal cisterns are clear. Vascular: Scattered calcification both siphons. No hyperdense  central vessels. Skull: The calvarium, skull base and orbits are intact. No skull lesion is seen. Sinuses/Orbits: There are retention cysts in the maxillary sinuses. Visualized sinuses are otherwise clear. There is right-sided deviation and spurring of the nasal septum. There is no mastoid effusion. Chronic bilateral vitreous hemorrhage is again noted with right phthisis bulbi. Other: None. IMPRESSION: 1. No acute intracranial CT findings or interval changes. 2. Atrophy and age-advanced small-vessel disease. 3. 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 Electronically Signed   By: Telford Nab M.D.   On: 06/12/2022 21:23    Assessment/Plan Present on Admission:  Influenza A/acute encephalopathy Chronic respiratory failure -Admit to med telemetry -Tamiflu ordered -albuterol nebs PRN -Respiratory consult -Oxygen to keep sats greater than 88%  ESRD/hyperkalemia -Nephrology contacted by EDP.  HD planned for a.m. -Lokelma initiated daily, first dose now -BMP in a.m.   Type 2 diabetes mellitus with hypoglycemia (HCC) -Sliding scale insulin,   Essential hypertension -Norvasc, Cozaar and hydralazine resumed -As needed blood pressure medications   Hyperlipidemia -Lipid medications on hold onto the patient's mentation improved   Gastroparesis due to DM (HCC)  Heart failure with preserved ejection fraction ([facility])  Pleural effusion, bilateral -Patient with history of thoracentesis.  Patient on baseline 2 L oxygen.   Laurenashley Viar 06/12/2022, 11:42 PM

## 2022-06-12 NOTE — ED Provider Notes (Signed)
[facility] EMERGENCY DEPARTMENT Provider Note   CSN: [id number] Arrival date & time: 06/12/22  2038     History  Chief Complaint  Patient presents with   Altered Mental Status    Sheila Gervasi is a 51 y.o. male with a past medical history of hypertension, type 2 diabetes, ESRD on dialysis presenting today with altered mental status.  Per EMS patient's facility says that he is acting altered and drowsy.  They state that he is usually alert and oriented x 4 and now he is not participating in conversations.  EMS states that the patient had dialysis earlier today and dialysis maybe finished the session?  But ultimately they sent him back to the facility complaining of altered level of consciousness.    I spoke to patient's facility who said that he is here for "jitters."    I got a family member Dash Cardarelli) on the phone who stated that the facility told her that he vomited earlier today and they wanted him to come for an x-ray to make sure there was no fluid in his lungs because after the emesis he started to act abnormally.  She says that the patient did not have dialysis.    Altered Mental Status      Home Medications Prior to Admission medications   Medication Sig Start Date End Date Taking? Authorizing Provider  albuterol (VENTOLIN HFA) 108 (90 Base) MCG/ACT inhaler Inhale 2 puffs into the lungs every 4 (four) hours as needed for shortness of breath.    [provider]  amLODipine (NORVASC) 10 MG tablet Take 0.5 tablets (5 mg total) by mouth daily. 05/10/21   Nita Sells, MD  aspirin EC 81 MG tablet Take 1 tablet (81 mg total) by mouth daily. 05/24/15   Ghimire, Henreitta Leber, MD  atorvastatin (LIPITOR) 40 MG tablet Take 1 tablet (40 mg total) by mouth daily. 12/05/20   Martinique, Peter M, MD  Atropine Sulfate 0.01 % SOLN Place 1 drop into both eyes in the morning and at bedtime. 04/24/21   [provider]  brimonidine (ALPHAGAN P) 0.1  % SOLN Place 1 drop into the left eye in the morning and at bedtime.    [provider]  calcium acetate (PHOSLO) 667 MG capsule Take 1 capsule (667 mg total) by mouth 3 (three) times daily with meals. Patient taking differently: Take 1,334 mg by mouth 3 (three) times daily with meals. 11/19/19   Charlynne Cousins, MD  capsaicin (ZOSTRIX) 0.025 % cream Apply 1 application. topically every 8 (eight) hours as needed (pain).    [provider]  Dextran 70-Hypromellose (TEARS PURE) 0.1-0.3 % SOLN Place 1 drop into both eyes in the morning, at noon, in the evening, and at bedtime.    [provider]  dorzolamide-timolol (COSOPT) 22.3-6.8 MG/ML ophthalmic solution Place 1 drop into the left eye 2 (two) times daily. 09/23/19   Geradine Girt, DO  ferrous sulfate 325 (65 FE) MG tablet Take 325 mg by mouth daily. 08/25/20   [provider]  guaiFENesin (ROBITUSSIN) 100 MG/5ML liquid Take 100 mg by mouth every 4 (four) hours as needed for cough.    [provider]  hydrALAZINE (APRESOLINE) 25 MG tablet Take 37.5 mg by mouth 3 (three) times daily.    [provider]  ipratropium-albuterol (DUONEB) 0.5-2.5 (3) MG/3ML SOLN Take 3 mLs by nebulization every 6 (six) hours as needed (shortness of breath or wheezing).    [provider]  latanoprost (XALATAN) 0.005 % ophthalmic solution Place 1 drop into the left eye at bedtime. 09/23/19   Geradine Girt, DO  losartan (COZAAR) 100 MG tablet Take 1 tablet (100 mg total) by mouth daily. 11/19/19   Charlynne Cousins, MD  Melatonin 10 MG CAPS Take 10 mg by mouth at bedtime.    [provider]  Menthol, Topical Analgesic, 5 % GEL Apply 1 application. topically every 8 (eight) hours as needed (hand/wrist pain).    [provider]  Nutritional Supplements (RESOURCE 2.0 PO) Take 120 mLs by mouth in the morning and at bedtime.    [provider]  olopatadine (PATANOL) 0.1 % ophthalmic  solution Place 2 drops into both eyes daily.    [provider]  ondansetron (ZOFRAN) 4 MG tablet Take 4 mg by mouth every 6 (six) hours as needed for nausea or vomiting.    [provider]  OXYGEN Inhale 3 L into the lungs continuous.    [provider]  polyethylene glycol (MIRALAX / GLYCOLAX) 17 g packet Take 17 g by mouth See admin instructions. Every three days.  May take an additional 17g daily as needed for constipation.    [provider]  pramoxine-zinc acetate (CALADRYL) 1-0.1 % LOTN Apply 1 application. topically every 8 (eight) hours as needed (back, torso, arms, and legs for puritus).    [provider]  prednisoLONE acetate (PRED FORTE) 1 % ophthalmic suspension Place 1 drop into both eyes 4 (four) times daily.    [provider]  pregabalin (LYRICA) 25 MG capsule Take 1 capsule (25 mg total) by mouth daily. 11/07/21   Mariel Aloe, MD  senna-docusate (SENOKOT-S) 8.6-50 MG tablet Take 2 tablets by mouth 2 (two) times daily.    [provider]  torsemide (DEMADEX) 100 MG tablet Take 100 mg by mouth 2 (two) times daily.    [provider]  vitamin B-12 1000 MCG tablet Take 1 tablet (1,000 mcg total) by mouth daily. 05/11/21   Nita Sells, MD  Vitamin D, Ergocalciferol, (DRISDOL) 1.25 MG (50000 UNIT) CAPS capsule Take 50,000 Units by mouth every Sunday.    [provider]      Allergies    Morphine and related    Review of Systems   Review of Systems  Physical Exam Updated Vital Signs There were no vitals taken for this visit. Physical Exam Vitals and nursing note reviewed.  Constitutional:      Appearance: Normal appearance.  HENT:     Head: Normocephalic and atraumatic.     Mouth/Throat:     Mouth: Mucous membranes are dry.     Pharynx: Oropharynx is clear.  Eyes:     General: No scleral icterus.    Conjunctiva/sclera: Conjunctivae normal.  Pulmonary:     Effort: Pulmonary  effort is normal. No respiratory distress.  Abdominal:     General: There is distension.     Palpations: Abdomen is soft.     Tenderness: There is no abdominal tenderness.  Musculoskeletal:     Comments: Patient will not range his lower extremities.  No edema, normal cap refill and warmth in bilateral feet.  No signs of infection  Skin:    Findings: No rash.  Neurological:     Mental Status: He is alert.     Comments: Patient following commands such as opening his mouth, opening his eyes, smiling and bilateral finger grip.  Does not participate in lower extremity exam.  Psychiatric:  Mood and Affect: Mood normal.     ED Results / Procedures / Treatments   Labs (all labs ordered are listed, but only abnormal results are displayed) Labs Reviewed  COMPREHENSIVE METABOLIC PANEL - Abnormal; Notable for the following components:      Result Value   Potassium 5.9 (*)    Chloride 93 (*)    Glucose, Bld 119 (*)    BUN 83 (*)    Creatinine, Ser 10.31 (*)    Calcium 8.8 (*)    GFR, Estimated 6 (*)    Anion gap 20 (*)    All other components within normal limits  CBC WITH DIFFERENTIAL/PLATELET - Abnormal; Notable for the following components:   RBC 3.18 (*)    Hemoglobin 9.7 (*)    HCT 30.3 (*)    RDW 16.1 (*)    Platelets 144 (*)    Neutro Abs 7.8 (*)    Monocytes Absolute 1.7 (*)    All other components within normal limits  I-STAT CHEM 8, ED - Abnormal; Notable for the following components:   Sodium 133 (*)    Potassium 5.7 (*)    Chloride 97 (*)    BUN 79 (*)    Creatinine, Ser 11.50 (*)    Glucose, Bld 117 (*)    Calcium, Ion 1.02 (*)    Hemoglobin 11.2 (*)    HCT 33.0 (*)    All other components within normal limits  I-STAT VENOUS BLOOD GAS, ED - Abnormal; Notable for the following components:   pH, Ven 7.432 (*)    pCO2, Ven 36.6 (*)    Sodium 133 (*)    Potassium 5.7 (*)    Calcium, Ion 1.01 (*)    HCT 31.0 (*)    Hemoglobin 10.5 (*)    All other  components within normal limits  AMMONIA  RAPID URINE DRUG SCREEN, HOSP PERFORMED  ETHANOL  CBG MONITORING, ED    EKG None  Radiology DG Chest Portable 1 View  Result Date: 06/12/2022 CLINICAL DATA:  Shortness of breath. EXAM: PORTABLE CHEST 1 VIEW COMPARISON:  Chest radiograph dated May 07, 2022 FINDINGS: The heart is enlarged. Pulmonary vascular congestion. Low lung volumes with bilateral lower lobe opacity suggesting moderate bilateral pleural effusions, right greater than the left. No acute osseous abnormality. IMPRESSION: 1. Cardiomegaly with pulmonary vascular congestion. 2. Moderate bilateral pleural effusions, right greater than left, findings likely sequela of congestive heart failure. Clinical correlation is suggested. Electronically Signed   By: Keane Police D.O.   On: 06/12/2022 21:54   CT HEAD WO CONTRAST  Result Date: 06/12/2022 CLINICAL DATA:  Dialysis patient with neuro deficit. Stroke suspected. EXAM: CT HEAD WITHOUT CONTRAST TECHNIQUE: Contiguous axial images were obtained from the base of the skull through the vertex without intravenous contrast. RADIATION DOSE REDUCTION: This exam was performed according to the departmental dose-optimization program which includes automated exposure control, adjustment of the mA and/or kV according to patient size and/or use of iterative reconstruction technique. COMPARISON:  Head CT 11/05/2021 FINDINGS: Brain: Again noted are mild cerebral atrophy and atrophic ventriculomegaly and moderately advanced, age-advanced small-vessel disease of the cerebral white matter. There is only slight cerebellar atrophy. No new asymmetry is seen worrisome for an acute infarct, hemorrhage or mass. There are benign dural calcifications scattered along the falx. No midline shift is seen. Basal cisterns are clear. Vascular: Scattered calcification both siphons. No hyperdense central vessels. Skull: The calvarium, skull base and orbits are intact. No skull  lesion  is seen. Sinuses/Orbits: There are retention cysts in the maxillary sinuses. Visualized sinuses are otherwise clear. There is right-sided deviation and spurring of the nasal septum. There is no mastoid effusion. Chronic bilateral vitreous hemorrhage is again noted with right phthisis bulbi. Other: None. IMPRESSION: 1. No acute intracranial CT findings or interval changes. 2. Atrophy and age-advanced small-vessel disease. 3. 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 Electronically Signed   By: Telford Nab M.D.   On: 06/12/2022 21:23    Procedures Procedures   Medications Ordered in ED Medications  sodium zirconium cyclosilicate (LOKELMA) packet 5 g (has no administration in time range)  Chlorhexidine Gluconate Cloth 2 % PADS 6 each (has no administration in time range)  acetaminophen (TYLENOL) suppository 650 mg (650 mg Rectal Given 06/12/22 2211)    ED Course/ Medical Decision Making/ A&P Clinical Course as of 06/12/22 2301  Wed Jun 12, 2022  2049 Attempted to contact patient's facility x 2 [MR]  2112 I spoke to patient's family member who reports that hospice called her.  She is unaware of the patient even being on hospice.  She was told that the facility was concerned because he vomited earlier today and they think that the fluid might be in his lungs.  They sent him to the emergency room to have an x-ray.  Family reports that patient did not have dialysis.   [MR]  2236 Patient is arousable and continues to follow commands.  He has noted to be requiring oxygen to keep saturations above 90.  Currently at 88-92 on 2L.  I per paperwork at patient's bedside it appears that he has as needed 2 L of O2 for dyspnea.  [MR]    Clinical Course User Index [MR] Arvis Zwahlen, Cecilio Asper, PA-C                           Medical Decision Making Amount and/or Complexity of Data Reviewed Labs: ordered. Radiology: ordered.  Risk OTC drugs. Prescription drug  management. Decision regarding hospitalization.   51 year old male presenting today with AMS.  Differential includes but is not limited to CVA, seizure, ingestion, dementia, metabolic encephalopathy, hypoglycemia.  This is not an exhaustive differential.    Past Medical History / Co-morbidities / Social History: ESRD, type 2 diabetes and hypertension   Additional history: Patient has an admission in May for generalized weakness.  There was no obvious etiology and patient was ultimately discharged home  Has a history of bilateral blindness.  I spoke to the patient's facility who could not provide much information.  They said "I think he is there for jitters."  They were able to say that patient is usually ambulatory, alert and oriented x 4.  Per paperwork at the bedside patient has an order for 2 L of nasal cannula oxygen the patient can have as needed for dyspnea   Physical Exam: Pertinent physical exam findings include Dry mucous membranes Following commands of patient's trunk and upper body, does not follow lower extremity commands   Lab Tests: I ordered, and personally interpreted labs.  The pertinent results include: Potassium 5.7 Creatinine 11.5, BUN 83, GFR 6   Imaging Studies: I ordered and independently visualized and interpreted head CT and I agree with the radiologist that there are no acute findings.  X-ray also without any signs of aspiration pneumonia as outpatient facility was concerned.  X-ray does show some bilateral pleural effusions.  Questionable pulmonary edema.   Cardiac Monitoring:  The patient was maintained on a cardiac monitor.  I viewed and interpreted the cardiac monitored which showed an underlying rhythm of: Sinus   Medications: Rectal Tylenol for patient's borderline fever   Consultations Obtained: I spoke with Dr. Verneda Skill with nephrology and he will put the patient on the list for tomorrow.  He does not believe that he needs emergent  dialysis at this time.  MDM/Disposition: This is a 51 year old male who presented today due to decreased level of consciousness and altered mental status.  His living facility is not very reliable and his family only knows that they told them that the patient was being sent to the hospital due to acting strangely.  Workup revealing of a potassium of 5.9, creatinine 10.3 and some pleural effusions on physical exam.  Nephrology was consulted and they do not believe the patient needs emergent dialysis.  They will put him on the list for dialysis tomorrow.  In regards to patient's AMS, he does not appear hypoxic, he is not hypertensive, is in a facility and does not have access to alcohol, does not meet sepsis or SIRS criteria, no severe electrolyte derangements.  Head CT negative for stroke, hemorrhage or mass.  Unable to obtain UDS, I suspect patient no longer makes urine.  BUN is 83, which could play a role in his encephalopathy.  At this time patient is arousable, protecting his airway, not tachycardic and does not show any signs of trauma on his physical exam.  He will need to be admitted for further evaluation of his encephalopathy and dialysis tomorrow morning.  Unassigned admission.  Final Clinical Impression(s) / ED Diagnoses Final diagnoses:  Encephalopathy    Rx / DC Orders ED Discharge Orders     None        Aayliah Rotenberry, [place], PA-C 06/12/22 2327    Audley Hose, MD 06/13/22 1921

## 2022-06-12 NOTE — ED Triage Notes (Signed)
Pt arrives [facility] from [facility] for AMS. Last known well is unknown at this time. According to facility and EMS report, pt is A&Ox4 at baseline. Unknown whether or not patient received dialysis today.

## 2022-06-13 DIAGNOSIS — E11649 Type 2 diabetes mellitus with hypoglycemia without coma: Secondary | ICD-10-CM | POA: Diagnosis present

## 2022-06-13 DIAGNOSIS — E785 Hyperlipidemia, unspecified: Secondary | ICD-10-CM | POA: Diagnosis present

## 2022-06-13 DIAGNOSIS — I5032 Chronic diastolic (congestive) heart failure: Secondary | ICD-10-CM | POA: Diagnosis present

## 2022-06-13 DIAGNOSIS — Z87891 Personal history of nicotine dependence: Secondary | ICD-10-CM | POA: Diagnosis not present

## 2022-06-13 DIAGNOSIS — G8929 Other chronic pain: Secondary | ICD-10-CM | POA: Diagnosis present

## 2022-06-13 DIAGNOSIS — K3184 Gastroparesis: Secondary | ICD-10-CM | POA: Diagnosis present

## 2022-06-13 DIAGNOSIS — Z9981 Dependence on supplemental oxygen: Secondary | ICD-10-CM | POA: Diagnosis not present

## 2022-06-13 DIAGNOSIS — Z833 Family history of diabetes mellitus: Secondary | ICD-10-CM | POA: Diagnosis not present

## 2022-06-13 DIAGNOSIS — R5381 Other malaise: Secondary | ICD-10-CM | POA: Diagnosis present

## 2022-06-13 DIAGNOSIS — N186 End stage renal disease: Secondary | ICD-10-CM | POA: Diagnosis present

## 2022-06-13 DIAGNOSIS — E1143 Type 2 diabetes mellitus with diabetic autonomic (poly)neuropathy: Secondary | ICD-10-CM | POA: Diagnosis present

## 2022-06-13 DIAGNOSIS — I132 Hypertensive heart and chronic kidney disease with heart failure and with stage 5 chronic kidney disease, or end stage renal disease: Secondary | ICD-10-CM | POA: Diagnosis present

## 2022-06-13 DIAGNOSIS — J101 Influenza due to other identified influenza virus with other respiratory manifestations: Secondary | ICD-10-CM | POA: Diagnosis present

## 2022-06-13 DIAGNOSIS — D631 Anemia in chronic kidney disease: Secondary | ICD-10-CM | POA: Diagnosis present

## 2022-06-13 DIAGNOSIS — J9611 Chronic respiratory failure with hypoxia: Secondary | ICD-10-CM | POA: Diagnosis present

## 2022-06-13 DIAGNOSIS — Z8616 Personal history of COVID-19: Secondary | ICD-10-CM | POA: Diagnosis not present

## 2022-06-13 DIAGNOSIS — Z7982 Long term (current) use of aspirin: Secondary | ICD-10-CM | POA: Diagnosis not present

## 2022-06-13 DIAGNOSIS — Z8249 Family history of ischemic heart disease and other diseases of the circulatory system: Secondary | ICD-10-CM | POA: Diagnosis not present

## 2022-06-13 DIAGNOSIS — G9340 Encephalopathy, unspecified: Secondary | ICD-10-CM | POA: Diagnosis present

## 2022-06-13 DIAGNOSIS — Z79899 Other long term (current) drug therapy: Secondary | ICD-10-CM | POA: Diagnosis not present

## 2022-06-13 DIAGNOSIS — E1122 Type 2 diabetes mellitus with diabetic chronic kidney disease: Secondary | ICD-10-CM | POA: Diagnosis present

## 2022-06-13 DIAGNOSIS — G928 Other toxic encephalopathy: Secondary | ICD-10-CM | POA: Diagnosis present

## 2022-06-13 DIAGNOSIS — G9341 Metabolic encephalopathy: Secondary | ICD-10-CM | POA: Diagnosis present

## 2022-06-13 DIAGNOSIS — H543 Unqualified visual loss, both eyes: Secondary | ICD-10-CM | POA: Diagnosis present

## 2022-06-13 DIAGNOSIS — E875 Hyperkalemia: Secondary | ICD-10-CM | POA: Diagnosis present

## 2022-06-13 DIAGNOSIS — Z992 Dependence on renal dialysis: Secondary | ICD-10-CM | POA: Diagnosis not present

## 2022-06-13 LAB — CBC
HCT: 30.7 % — ABNORMAL LOW (ref 39.0–52.0)
Hemoglobin: 9.8 g/dL — ABNORMAL LOW (ref 13.0–17.0)
MCH: 29.9 pg (ref 26.0–34.0)
MCHC: 31.9 g/dL (ref 30.0–36.0)
MCV: 93.6 fL (ref 80.0–100.0)
Platelets: 147 10*3/uL — ABNORMAL LOW (ref 150–400)
RBC: 3.28 MIL/uL — ABNORMAL LOW (ref 4.22–5.81)
RDW: 16.1 % — ABNORMAL HIGH (ref 11.5–15.5)
WBC: 8.3 10*3/uL (ref 4.0–10.5)
nRBC: 0 % (ref 0.0–0.2)

## 2022-06-13 LAB — BASIC METABOLIC PANEL
Anion gap: 20 — ABNORMAL HIGH (ref 5–15)
BUN: 87 mg/dL — ABNORMAL HIGH (ref 6–20)
CO2: 21 mmol/L — ABNORMAL LOW (ref 22–32)
Calcium: 8.8 mg/dL — ABNORMAL LOW (ref 8.9–10.3)
Chloride: 94 mmol/L — ABNORMAL LOW (ref 98–111)
Creatinine, Ser: 10.78 mg/dL — ABNORMAL HIGH (ref 0.61–1.24)
GFR, Estimated: 5 mL/min — ABNORMAL LOW (ref >60)
Glucose, Bld: 90 mg/dL (ref 70–99)
Potassium: 5.4 mmol/L — ABNORMAL HIGH (ref 3.5–5.1)
Sodium: 135 mmol/L (ref 135–145)

## 2022-06-13 LAB — ETHANOL: Alcohol, Ethyl (B): <10 mg/dL (ref <10)

## 2022-06-13 LAB — HEPATITIS B SURFACE ANTIGEN: Hepatitis B Surface Ag: NONREACTIVE

## 2022-06-13 LAB — RESP PANEL BY RT-PCR (RSV, FLU A&B, COVID)  RVPGX2
Influenza A by PCR: POSITIVE — AB
Influenza B by PCR: NEGATIVE
Resp Syncytial Virus by PCR: NEGATIVE
SARS Coronavirus 2 by RT PCR: NEGATIVE

## 2022-06-13 LAB — PHOSPHORUS: Phosphorus: 7.3 mg/dL — ABNORMAL HIGH (ref 2.5–4.6)

## 2022-06-13 LAB — GLUCOSE, CAPILLARY: Glucose-Capillary: 85 mg/dL (ref 70–99)

## 2022-06-13 LAB — HIV ANTIBODY (ROUTINE TESTING W REFLEX): HIV Screen 4th Generation wRfx: NONREACTIVE

## 2022-06-13 MED ORDER — HEPARIN SODIUM (PORCINE) 5000 UNIT/ML IJ SOLN
5000.0000 [IU] | Freq: Three times a day (TID) | INTRAMUSCULAR | Status: DC
  Administered 2022-06-13 – 2022-06-15 (×7): 5000 [IU] via SUBCUTANEOUS
  Filled 2022-06-13 (×7): qty 1

## 2022-06-13 MED ORDER — ACETAMINOPHEN 650 MG RE SUPP: 650.0000 mg | Freq: Four times a day (QID) | RECTAL | Status: DC | PRN

## 2022-06-13 MED ORDER — OSELTAMIVIR PHOSPHATE 6 MG/ML PO SUSR
30.0000 mg | ORAL | Status: DC
  Administered 2022-06-14: 30 mg via ORAL
  Filled 2022-06-13 (×2): qty 12.5

## 2022-06-13 MED ORDER — HYDRALAZINE HCL 25 MG PO TABS
37.5000 mg | ORAL_TABLET | Freq: Three times a day (TID) | ORAL | Status: DC
  Administered 2022-06-13 – 2022-06-15 (×7): 37.5 mg via ORAL
  Filled 2022-06-13 (×7): qty 2

## 2022-06-13 MED ORDER — ASPIRIN 81 MG PO TBEC
81.0000 mg | DELAYED_RELEASE_TABLET | Freq: Every day | ORAL | Status: DC
  Administered 2022-06-14 – 2022-06-15 (×2): 81 mg via ORAL
  Filled 2022-06-13 (×2): qty 1

## 2022-06-13 MED ORDER — LATANOPROST 0.005 % OP SOLN
1.0000 [drp] | Freq: Every day | OPHTHALMIC | Status: DC
  Administered 2022-06-13 – 2022-06-14 (×2): 1 [drp] via OPHTHALMIC
  Filled 2022-06-13: qty 2.5

## 2022-06-13 MED ORDER — HEPARIN SODIUM (PORCINE) 1000 UNIT/ML DIALYSIS: 20.0000 [IU]/kg | INTRAMUSCULAR | Status: DC | PRN

## 2022-06-13 MED ORDER — ANTICOAGULANT SODIUM CITRATE 4% (200MG/5ML) IV SOLN: 5.0000 mL | Status: DC | PRN

## 2022-06-13 MED ORDER — ACETAMINOPHEN 325 MG PO TABS: 650.0000 mg | ORAL_TABLET | Freq: Four times a day (QID) | ORAL | Status: DC | PRN

## 2022-06-13 MED ORDER — AMLODIPINE BESYLATE 5 MG PO TABS
5.0000 mg | ORAL_TABLET | Freq: Every day | ORAL | Status: DC
  Administered 2022-06-13 – 2022-06-15 (×3): 5 mg via ORAL
  Filled 2022-06-13 (×3): qty 1

## 2022-06-13 MED ORDER — VITAMIN B-12 1000 MCG PO TABS
1000.0000 ug | ORAL_TABLET | Freq: Every day | ORAL | Status: DC
  Administered 2022-06-14 – 2022-06-15 (×2): 1000 ug via ORAL
  Filled 2022-06-13 (×2): qty 1

## 2022-06-13 MED ORDER — ALTEPLASE 2 MG IJ SOLR: 2.0000 mg | Freq: Once | INTRAMUSCULAR | Status: DC | PRN

## 2022-06-13 MED ORDER — CHLORHEXIDINE GLUCONATE CLOTH 2 % EX PADS
6.0000 | MEDICATED_PAD | Freq: Every day | CUTANEOUS | Status: DC
  Administered 2022-06-14 – 2022-06-15 (×2): 6 via TOPICAL

## 2022-06-13 MED ORDER — ALBUTEROL SULFATE (2.5 MG/3ML) 0.083% IN NEBU: 2.5000 mg | INHALATION_SOLUTION | RESPIRATORY_TRACT | Status: DC | PRN

## 2022-06-13 MED ORDER — TORSEMIDE 20 MG PO TABS
100.0000 mg | ORAL_TABLET | Freq: Two times a day (BID) | ORAL | Status: DC
  Administered 2022-06-13 – 2022-06-15 (×5): 100 mg via ORAL
  Filled 2022-06-13 (×5): qty 5

## 2022-06-13 MED ORDER — DORZOLAMIDE HCL-TIMOLOL MAL 2-0.5 % OP SOLN
1.0000 [drp] | Freq: Two times a day (BID) | OPHTHALMIC | Status: DC
  Administered 2022-06-13 – 2022-06-15 (×5): 1 [drp] via OPHTHALMIC
  Filled 2022-06-13: qty 10

## 2022-06-13 MED ORDER — PREGABALIN 25 MG PO CAPS
25.0000 mg | ORAL_CAPSULE | Freq: Every day | ORAL | Status: DC
  Administered 2022-06-14 – 2022-06-15 (×2): 25 mg via ORAL
  Filled 2022-06-13 (×2): qty 1

## 2022-06-13 MED ORDER — DOXERCALCIFEROL 4 MCG/2ML IV SOLN: 4.0000 ug | INTRAVENOUS | Status: DC

## 2022-06-13 MED ORDER — FERROUS SULFATE 325 (65 FE) MG PO TABS
325.0000 mg | ORAL_TABLET | Freq: Every day | ORAL | Status: DC
  Administered 2022-06-14 – 2022-06-15 (×2): 325 mg via ORAL
  Filled 2022-06-13 (×2): qty 1

## 2022-06-13 MED ORDER — HEPARIN SODIUM (PORCINE) 1000 UNIT/ML DIALYSIS: 1000.0000 [IU] | INTRAMUSCULAR | Status: DC | PRN

## 2022-06-13 MED ORDER — SENNOSIDES-DOCUSATE SODIUM 8.6-50 MG PO TABS
1.0000 | ORAL_TABLET | Freq: Every evening | ORAL | Status: DC | PRN
  Administered 2022-06-14: 1 via ORAL
  Filled 2022-06-13: qty 1

## 2022-06-13 MED ORDER — LOSARTAN POTASSIUM 50 MG PO TABS
100.0000 mg | ORAL_TABLET | Freq: Every day | ORAL | Status: DC
  Administered 2022-06-14 – 2022-06-15 (×2): 100 mg via ORAL
  Filled 2022-06-13 (×2): qty 2

## 2022-06-13 MED ORDER — ATORVASTATIN CALCIUM 40 MG PO TABS
40.0000 mg | ORAL_TABLET | Freq: Every day | ORAL | Status: DC
  Administered 2022-06-13 – 2022-06-15 (×3): 40 mg via ORAL
  Filled 2022-06-13 (×3): qty 1

## 2022-06-13 NOTE — Progress Notes (Addendum)
[facility]) Hospital Liaison note:  This patient is currently enrolled in [facility] outpatient-based Palliative Care and not on hospice services. Will continue to follow for disposition.  Please call with any outpatient palliative questions or concerns.  Thank you, Lorelee Market, LPN [facility] Liaison [phone]

## 2022-06-13 NOTE — Progress Notes (Signed)
PROGRESS NOTE    Christopher Davila  ZOX:[id number] DOB: December 29, 1970 DOA: 06/12/2022 PCP: [name], MD   Brief Narrative:  HPI: This is a 51 year old male nursing home resident.  He has a history of ESRD, HTN, chronic diastolic CHF, chronic respiratory failure 2 L, blindness, gastroparesis and peripheral neuropathy.  He is on hemodialysis Monday/Wednesday/Friday.  Today he was sent for hemodialysis, this was not done because he was not feeling well.  At the facility patient was confused, nonverbal, noncommunicative.  At baseline patient is alert and oriented x 3.  His Tmax was 100.  Patient is on hospice, hospice was contacted, after evaluation it was recommended he present to the ER.   In the ER patient remains encephalopathic.  Influenza screening is positive.  Assessment & Plan:   Principal Problem:   Acute encephalopathy Active Problems:   Type 2 diabetes mellitus with hypoglycemia (HCC)   Essential hypertension   Hyperlipidemia   Gastroparesis due to DM (HCC)   Alcohol use   ESRD on dialysis ([place])   Heart failure with preserved ejection fraction (HCC)   History of thoracentesis   Pleural effusion, bilateral   Hyperkalemia   Chronic respiratory failure with hypoxia (HCC)   Acute metabolic encephalopathy  Acute toxic encephalopathy/influenza A infection: Encephalopathy likely secondary to influenza A infection.  Patient on my examination and assessment today, was alert and oriented to self and place only but could not answer any other question.  I spoke to his healthcare agent Pryce Folts who happens to be his cousin as well and she told me that he lives in an assisted living facility and typically patient is fully alert and oriented and knows everything that goes around him.  Based on that, he still appears to be encephalopathy, we will continue to monitor and treat underlying causes.  Continue Tamiflu.  ESRD on HD/hyperkalemia: Nephrology on board.  Patient is getting  dialysis now.  Type 2 diabetes mellitus: Continue SSI.  Essential hypertension: Blood pressure elevated, home medications are signed and held but have not been released by the nursing, and thus has not been given, once he arrives to the floor after his hemodialysis is complete, the nurse has been advised to release those orders.   Hyperlipidemia: Will resume statin.  DVT prophylaxis: heparin injection 5,000 Units Start: 06/13/22 0600 SCDs Start: 06/13/22 0428   Code Status: Full Code  Family Communication:  None present at bedside.  Plan of care discussed with patient's healthcare agent.  Status is: Inpatient Remains inpatient appropriate because: Still encephalopathic.  Reassess tomorrow.   Estimated body mass index is 24.95 kg/m as calculated from the following:   Height as of this encounter: 5\' 3"  (1.6 m).   Weight as of this encounter: 63.9 kg.    Nutritional Assessment: Body mass index is 24.95 kg/m.Marland Kitchen Seen by dietician.  I agree with the assessment and plan as outlined below: Nutrition Status:        . Skin Assessment: I have examined the patient's skin and I agree with the wound assessment as performed by the wound care RN as outlined below:    Consultants:  Nephrology  Procedures:  None  Antimicrobials:  Anti-infectives (From admission, onward)    Start     Dose/Rate Route Frequency Ordered Stop   06/13/22 0600  oseltamivir (TAMIFLU) 6 MG/ML suspension 30 mg       Note to Pharmacy: Tamiflu 30 mg after each HD for ESRD on HD   30 mg Oral Every  M-W-F (Hemodialysis) 06/13/22 0307 06/19/22 1159         Subjective: Patient seen and examined in dialysis unit.  He was alert and oriented to self and place but could not answer any further questions.  He also appeared to have hard time understanding my questions.  He often was found stuck with 1 answer repeatedly despite of asking different questions.  Objective: Vitals:   06/13/22 1000 06/13/22 1030 06/13/22  1100 06/13/22 1130  BP: (!) 160/86 (!) 181/92 (!) 162/110 (!) 169/93  Pulse: 88 100 98 98  Resp: 16 (!) 29 18 (!) 25  Temp:      TempSrc:      SpO2: 99% 96% 95% 92%  Weight:      Height:        Intake/Output Summary (Last 24 hours) at 06/13/2022 1135 Last data filed at 06/13/2022 0445 Gross per 24 hour  Intake --  Output 100 ml  Net -100 ml   Filed Weights   06/13/22 0446 06/13/22 0833  Weight: 63.7 kg 63.9 kg    Examination:  General exam: Appears calm and comfortable  Respiratory system: Clear to auscultation. Respiratory effort normal. Cardiovascular system: S1 & S2 heard, RRR. No JVD, murmurs, rubs, gallops or clicks. No pedal edema. Gastrointestinal system: Abdomen is nondistended, soft and nontender. No organomegaly or masses felt. Normal bowel sounds heard. Central nervous system: Alert and oriented x 1-2. No focal neurological deficits. Extremities: Symmetric 5 x 5 power. Skin: No rashes, lesions or ulcers Psychiatry: Judgement and insight appear poor Data Reviewed: I have personally reviewed following labs and imaging studies  CBC: Recent Labs  Lab 06/12/22 2108 06/12/22 2125 06/13/22 0456  WBC  --  10.5 8.3  NEUTROABS  --  7.8*  --   HGB 11.2*  10.5* 9.7* 9.8*  HCT 33.0*  31.0* 30.3* 30.7*  MCV  --  95.3 93.6  PLT  --  144* 546*   Basic Metabolic Panel: Recent Labs  Lab 06/12/22 2108 06/12/22 2121 06/13/22 0456  NA 133*  133* 135 135  K 5.7*  5.7* 5.9* 5.4*  CL 97* 93* 94*  CO2  --  22 21*  GLUCOSE 117* 119* 90  BUN 79* 83* 87*  CREATININE 11.50* 10.31* 10.78*  CALCIUM  --  8.8* 8.8*   GFR: Estimated Creatinine Clearance: 6.6 mL/min (A) (by C-G formula based on SCr of 10.78 mg/dL (H)). Liver Function Tests: Recent Labs  Lab 06/12/22 2121  AST 39  ALT 35  ALKPHOS 99  BILITOT 1.1  PROT 7.3  ALBUMIN 3.5   No results for input(s): "LIPASE", "AMYLASE" in the last 168 hours. Recent Labs  Lab 06/12/22 2125  AMMONIA 29    Coagulation Profile: No results for input(s): "INR", "PROTIME" in the last 168 hours. Cardiac Enzymes: No results for input(s): "CKTOTAL", "CKMB", "CKMBINDEX", "TROPONINI" in the last 168 hours. BNP (last 3 results) No results for input(s): "PROBNP" in the last 8760 hours. HbA1C: No results for input(s): "HGBA1C" in the last 72 hours. CBG: Recent Labs  Lab 06/13/22 0419  GLUCAP 85   Lipid Profile: No results for input(s): "CHOL", "HDL", "LDLCALC", "TRIG", "CHOLHDL", "LDLDIRECT" in the last 72 hours. Thyroid Function Tests: No results for input(s): "TSH", "T4TOTAL", "FREET4", "T3FREE", "THYROIDAB" in the last 72 hours. Anemia Panel: No results for input(s): "VITAMINB12", "FOLATE", "FERRITIN", "TIBC", "IRON", "RETICCTPCT" in the last 72 hours. Sepsis Labs: No results for input(s): "PROCALCITON", "LATICACIDVEN" in the last 168 hours.  Recent Results (from the  past 240 hour(s))  Resp panel by RT-PCR (RSV, Flu A&B, Covid) Anterior Nasal Swab     Status: Abnormal   Collection Time: 06/12/22 10:45 PM   Specimen: Anterior Nasal Swab  Result Value Ref Range Status   SARS Coronavirus 2 by RT PCR NEGATIVE NEGATIVE Final    Comment: (NOTE) SARS-CoV-2 target nucleic acids are NOT DETECTED.  The SARS-CoV-2 RNA is generally detectable in upper respiratory specimens during the acute phase of infection. The lowest concentration of SARS-CoV-2 viral copies this assay can detect is 138 copies/mL. A negative result does not preclude SARS-Cov-2 infection and should not be used as the sole basis for treatment or other patient management decisions. A negative result may occur with  improper specimen collection/handling, submission of specimen other than nasopharyngeal swab, presence of viral mutation(s) within the areas targeted by this assay, and inadequate number of viral copies(<138 copies/mL). A negative result must be combined with clinical observations, patient history, and  epidemiological information. The expected result is Negative.  Fact Sheet for Patients:  EntrepreneurPulse.com.au  Fact Sheet for Healthcare Providers:  IncredibleEmployment.be  This test is no t yet approved or cleared by the [place] FDA and  has been authorized for detection and/or diagnosis of SARS-CoV-2 by FDA under an Emergency Use Authorization (EUA). This EUA will remain  in effect (meaning this test can be used) for the duration of the COVID-19 declaration under Section 564(b)(1) of the Act, 21 U.S.C.section 360bbb-3(b)(1), unless the authorization is terminated  or revoked sooner.       Influenza A by PCR POSITIVE (A) NEGATIVE Final   Influenza B by PCR NEGATIVE NEGATIVE Final    Comment: (NOTE) The Xpert Xpress SARS-CoV-2/FLU/RSV plus assay is intended as an aid in the diagnosis of influenza from Nasopharyngeal swab specimens and should not be used as a sole basis for treatment. Nasal washings and aspirates are unacceptable for Xpert Xpress SARS-CoV-2/FLU/RSV testing.  Fact Sheet for Patients: EntrepreneurPulse.com.au  Fact Sheet for Healthcare Providers: IncredibleEmployment.be  This test is not yet approved or cleared by the [place] FDA and has been authorized for detection and/or diagnosis of SARS-CoV-2 by FDA under an Emergency Use Authorization (EUA). This EUA will remain in effect (meaning this test can be used) for the duration of the COVID-19 declaration under Section 564(b)(1) of the Act, 21 U.S.C. section 360bbb-3(b)(1), unless the authorization is terminated or revoked.     Resp Syncytial Virus by PCR NEGATIVE NEGATIVE Final    Comment: (NOTE) Fact Sheet for Patients: EntrepreneurPulse.com.au  Fact Sheet for Healthcare Providers: IncredibleEmployment.be  This test is not yet approved or cleared by the [place] FDA and has  been authorized for detection and/or diagnosis of SARS-CoV-2 by FDA under an Emergency Use Authorization (EUA). This EUA will remain in effect (meaning this test can be used) for the duration of the COVID-19 declaration under Section 564(b)(1) of the Act, 21 U.S.C. section 360bbb-3(b)(1), unless the authorization is terminated or revoked.  Performed at [facility], [place] [address] [place], [place] [address]      Radiology Studies: DG Chest Portable 1 View  Result Date: 06/12/2022 CLINICAL DATA:  Shortness of breath. EXAM: PORTABLE CHEST 1 VIEW COMPARISON:  Chest radiograph dated May 07, 2022 FINDINGS: The heart is enlarged. Pulmonary vascular congestion. Low lung volumes with bilateral lower lobe opacity suggesting moderate bilateral pleural effusions, right greater than the left. No acute osseous abnormality. IMPRESSION: 1. Cardiomegaly with pulmonary vascular congestion. 2. Moderate bilateral pleural effusions,  right greater than left, findings likely sequela of congestive heart failure. Clinical correlation is suggested. Electronically Signed   By: Keane Police D.O.   On: 06/12/2022 21:54   CT HEAD WO CONTRAST  Result Date: 06/12/2022 CLINICAL DATA:  Dialysis patient with neuro deficit. Stroke suspected. EXAM: CT HEAD WITHOUT CONTRAST TECHNIQUE: Contiguous axial images were obtained from the base of the skull through the vertex without intravenous contrast. RADIATION DOSE REDUCTION: This exam was performed according to the departmental dose-optimization program which includes automated exposure control, adjustment of the mA and/or kV according to patient size and/or use of iterative reconstruction technique. COMPARISON:  Head CT 11/05/2021 FINDINGS: Brain: Again noted are mild cerebral atrophy and atrophic ventriculomegaly and moderately advanced, age-advanced small-vessel disease of the cerebral white matter. There is only slight cerebellar atrophy. No new asymmetry is seen  worrisome for an acute infarct, hemorrhage or mass. There are benign dural calcifications scattered along the falx. No midline shift is seen. Basal cisterns are clear. Vascular: Scattered calcification both siphons. No hyperdense central vessels. Skull: The calvarium, skull base and orbits are intact. No skull lesion is seen. Sinuses/Orbits: There are retention cysts in the maxillary sinuses. Visualized sinuses are otherwise clear. There is right-sided deviation and spurring of the nasal septum. There is no mastoid effusion. Chronic bilateral vitreous hemorrhage is again noted with right phthisis bulbi. Other: None. IMPRESSION: 1. No acute intracranial CT findings or interval changes. 2. Atrophy and age-advanced small-vessel disease. 3. 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 Electronically Signed   By: Telford Nab M.D.   On: 06/12/2022 21:23    Scheduled Meds:  Chlorhexidine Gluconate Cloth  6 each Topical Q0600   heparin  5,000 Units Subcutaneous Q8H   oseltamivir  30 mg Oral Q M,W,F-HD   sodium zirconium cyclosilicate  5 g Oral Daily   Continuous Infusions:   LOS: 0 days   Darliss Cheney, MD [facility]  06/13/2022, 11:35 AM   *Please note that this is a verbal dictation therefore any spelling or grammatical errors are due to the "[place] One" system interpretation.  Please page via [place] and do not message via secure chat for urgent patient care matters. Secure chat can be used for non urgent patient care matters.  How to contact the [facility] Attending or Consulting provider [place] or covering provider during after hours [place], for this patient?  Check the care team in [facility] and look for a) attending/consulting [facility] provider listed and b) the [facility] team listed. Page or secure chat [phone]. Log into [email or web address] and use [place]'s universal password to access. If you do not have the password, please contact the hospital operator. Locate the [facility] provider  you are looking for under [facility] and page to a number that you can be directly reached. If you still have difficulty reaching the provider, please page the [facility] (Director on Call) for the Hospitalists listed on amion for assistance.

## 2022-06-13 NOTE — Procedures (Signed)
   I was present at this dialysis session, have reviewed the session itself and made  appropriate changes Kelly Splinter MD [place] pager [phone]   06/13/2022, 2:48 PM '

## 2022-06-13 NOTE — Progress Notes (Signed)
   06/13/22 1243  Vitals  Temp 98.7 F (37.1 C)  Temp Source Oral  BP (!) 182/132  BP Location Left Arm  BP Method Automatic  Patient Position (if appropriate) Lying  Pulse Rate 98  Pulse Rate Source Monitor  ECG Heart Rate (!) 101  Resp (!) 24  Oxygen Therapy  SpO2 90 %  O2 Device Nasal Cannula  O2 Flow Rate (L/min) 2 L/min  Patient Activity (if Appropriate) In bed  Pulse Oximetry Type Continuous   Received patient in bed to unit.  Alert and oriented.  Informed consent signed and in chart.   Treatment initiated: 0901 Treatment completed: 1229  Patient tolerated well.  Transported back to the room  Alert, without acute distress.  Hand-off given to patient's nurse.   Access used: AVF Access issues: NA  Total UF removed: 3500 ml Medication(s) given: NA Post HD VS: see above Post HD weight: 60.4kg   [facility]

## 2022-06-13 NOTE — Consult Note (Signed)
Renal Service Consult Note [facility]  Christopher Davila 06/13/2022 Sol Blazing, MD Requesting Physician: Dr. Darliss Cheney  Reason for Consult:  HPI: The patient is a 51 y.o. year-old w/ PMH as below who presented from [place] [facility] for [place].  Pt is blind at baseline, on HD MWF. They sent him to HD on 12/27 but pt was nonverbal, confused and noncommunicative so he was sent to the ED. In ED pt was A&Ox3, tmax 100. Flu A was +, covid negative. Pt rec'd nebulizers, O2. We were asked to see for HD and orders were written and pt is on HD at this time.   Pt seen in HD unit, pt is in and out of communication. No c/o'. S poor historian.   ROS - denies CP, no joint pain, no HA, no blurry vision, no rash, no diarrhea, no nausea/ vomiting, no dysuria, no difficulty voiding   Past Medical History  Past Medical History:  Diagnosis Date   Acute on chronic diastolic CHF (congestive heart failure) ([place]) 09/27/2019   Acute respiratory failure with hypoxia ([place]) 03/26/2021   Acute urinary retention 10/13/2019   Anemia    Blindness 09/04/2019   CHF (congestive heart failure) ([place])    COVID-19    Diabetes mellitus    Diarrhea 10/14/2019   Epigastric abdominal pain    ESRD (end stage renal disease) ([place])    Gastroparesis due to DM ([place])    Hypertension    Hypertensive urgency 04/17/2019   Hypervolemia    Hypoalbuminemia 04/17/2019   Hypoxia    Metabolic acidosis, increased anion gap 10/13/2019   Multifocal pneumonia 05/06/2021   Neuropathy of lower extremity    Oral candidiasis 06/26/2012   Palliative care encounter    Pancreatitis 06/26/2012   Weight loss 06/26/2012   Past Surgical History  Past Surgical History:  Procedure Laterality Date   AV FISTULA PLACEMENT Right 10/29/2019   Procedure: RIGHT ARM BRACHIOBASILIC ARTERIOVENOUS (AV) FISTULA CREATION;  Surgeon: Rosetta Posner, MD;  Location: [facility] OR;  Service: Vascular;  Laterality: Right;   [place] Right  01/05/2020   Procedure: RIGHT ARM SECOND STAGE [place];  Surgeon: Rosetta Posner, MD;  Location: [facility] OR;  Service: Vascular;  Laterality: Right;   EYE SURGERY     IR FLUORO GUIDE CV LINE RIGHT  10/28/2019   IR US GUIDE VASC ACCESS RIGHT  10/28/2019   Family History  Family History  Problem Relation Age of Onset   Diabetes Mother    Lung disease Mother    Hypertension Mother    Diabetes Maternal Aunt    CAD Maternal Aunt    CAD Cousin    Social History  reports that he quit smoking about 7 years ago. His smoking use included cigarettes. He has never used smokeless tobacco. He reports that he does not currently use alcohol. He reports that he does not use drugs. Allergies  Allergies  Allergen Reactions   Morphine And Related Itching and Other (See Comments)    Pt prefers not to be given this drug   Home medications Prior to Admission medications   Medication Sig Start Date End Date Taking? Authorizing Provider  albuterol (VENTOLIN HFA) 108 (90 Base) MCG/ACT inhaler Inhale 2 puffs into the lungs every 4 (four) hours as needed for shortness of breath.    [provider]  amLODipine (NORVASC) 10 MG tablet Take 0.5 tablets (5 mg total) by mouth daily. 05/10/21   Nita Sells, MD  aspirin EC 81 MG tablet Take 1 tablet (81 mg total) by mouth daily. 05/24/15   Ghimire, Henreitta Leber, MD  atorvastatin (LIPITOR) 40 MG tablet Take 1 tablet (40 mg total) by mouth daily. 12/05/20   [place], Peter M, MD  Atropine Sulfate 0.01 % SOLN Place 1 drop into both eyes in the morning and at bedtime. 04/24/21   [provider]  brimonidine (ALPHAGAN P) 0.1 % SOLN Place 1 drop into the left eye in the morning and at bedtime.    [provider]  calcium acetate (PHOSLO) 667 MG capsule Take 1 capsule (667 mg total) by mouth 3 (three) times daily with meals. Patient taking differently: Take 1,334 mg by mouth 3 (three) times daily with meals. 11/19/19   Charlynne Cousins, MD  capsaicin (ZOSTRIX) 0.025 % cream Apply 1 application. topically every 8 (eight) hours as needed (pain).    [provider]  Dextran 70-Hypromellose (TEARS PURE) 0.1-0.3 % SOLN Place 1 drop into both eyes in the morning, at noon, in the evening, and at bedtime.    [provider]  dorzolamide-timolol (COSOPT) 22.3-6.8 MG/ML ophthalmic solution Place 1 drop into the left eye 2 (two) times daily. 09/23/19   Geradine Girt, DO  ferrous sulfate 325 (65 FE) MG tablet Take 325 mg by mouth daily. 08/25/20   [provider]  guaiFENesin (ROBITUSSIN) 100 MG/5ML liquid Take 100 mg by mouth every 4 (four) hours as needed for cough.    [provider]  hydrALAZINE (APRESOLINE) 25 MG tablet Take 37.5 mg by mouth 3 (three) times daily.    [provider]  ipratropium-albuterol (DUONEB) 0.5-2.5 (3) MG/3ML SOLN Take 3 mLs by nebulization every 6 (six) hours as needed (shortness of breath or wheezing).    [provider]  latanoprost (XALATAN) 0.005 % ophthalmic solution Place 1 drop into the left eye at bedtime. 09/23/19   Geradine Girt, DO  losartan (COZAAR) 100 MG tablet Take 1 tablet (100 mg total) by mouth daily. 11/19/19   Charlynne Cousins, MD  Melatonin 10 MG CAPS Take 10 mg by mouth at bedtime.    [provider]  Menthol, Topical Analgesic, 5 % GEL Apply 1 application. topically every 8 (eight) hours as needed (hand/wrist pain).    [provider]  Nutritional Supplements (RESOURCE 2.0 PO) Take 120 mLs by mouth in the morning and at bedtime.    [provider]  olopatadine (PATANOL) 0.1 % ophthalmic solution Place 2 drops into both eyes daily.    [provider]  ondansetron (ZOFRAN) 4 MG tablet Take 4 mg by mouth every 6 (six) hours as needed for nausea or vomiting.    [provider]  OXYGEN Inhale 3 L into the lungs continuous.    [provider]  polyethylene glycol (MIRALAX /  GLYCOLAX) 17 g packet Take 17 g by mouth See admin instructions. Every three days.  May take an additional 17g daily as needed for constipation.    [provider]  pramoxine-zinc acetate (CALADRYL) 1-0.1 % LOTN Apply 1 application. topically every 8 (eight) hours as needed (back, torso, arms, and legs for puritus).    [provider]  prednisoLONE acetate (PRED FORTE) 1 % ophthalmic suspension Place 1 drop into both eyes 4 (four) times daily.    [provider]  pregabalin (LYRICA) 25 MG capsule Take 1 capsule (25 mg total) by mouth daily. 11/07/21   Mariel Aloe, MD  senna-docusate (  SENOKOT-S) 8.6-50 MG tablet Take 2 tablets by mouth 2 (two) times daily.    [provider]  torsemide (DEMADEX) 100 MG tablet Take 100 mg by mouth 2 (two) times daily.    [provider]  vitamin B-12 1000 MCG tablet Take 1 tablet (1,000 mcg total) by mouth daily. 05/11/21   Nita Sells, MD  Vitamin D, Ergocalciferol, (DRISDOL) 1.25 MG (50000 UNIT) CAPS capsule Take 50,000 Units by mouth every Sunday.    [provider]     Vitals:   06/13/22 1229 06/13/22 1243 06/13/22 1249 06/13/22 1333  BP: (!) 166/87 (!) 182/132 (!) 176/84 (!) 167/98  Pulse: 99 98 (!) 105 71  Resp: (!) 25 (!) 24 (!) 28 20  Temp:  98.7 F (37.1 C)    TempSrc:  Oral    SpO2: 93% 90%  (!) 45%  Weight:  60.4 kg    Height:       Exam Gen alert, no distress, blindness No rash, cyanosis or gangrene Sclera anicteric, throat clear  No jvd or bruits Chest clear bilat to bases, no rales/ wheezing RRR no MRG Abd soft ntnd no mass or ascites +bs GU defer MS no joint effusions or deformity Ext no LE or UE edema, no wounds or ulcers Neuro is alert, Ox 3 , nf    RUA AVF+bruit     Home meds include - norvasc 5, asa, lipitor, hydralazine 37.5mg  tid, losartan 100 qd, lyrica, torsemide 100 bid, vits/ supps/ prns     OP HD: MWF [place] 4h 400/ 600  60kg  2/2 bath  RUA AVF   Heparin 3000 - last HD 12/24, post wt 62.0kg - hectorol 4 ug IV tiw - mircera 120 mcg q4, last 12/8, due 1/05 - last Hb 10.0  CXR - bilat pulm edema  Assessment/ Plan: AMS - due to infection, stress, ESRD. No meds implicated.  Influenza A - per pmd Pulm edema - by CXR last night, max UF w/ HD today ESRD - on HD MWF. Plan HD today off schedule. Missed HD yesterday. Then HD again tomorrow to get on schedule.  HTN/ volume - BP's on the high side, get vol down w/ HD today and tomorrow. Lower dry wt if possible.  Anemia esrd - Hb 9-10 range, follow MBD ckd - CCa in range, add on phos. Cont IV vdra and binder Hx etoh abuse      Rob Eviana Sibilia  MD 06/13/2022, 2:19 PM Recent Labs  Lab 06/12/22 2121 06/12/22 2125 06/13/22 0456  HGB  --  9.7* 9.8*  ALBUMIN 3.5  --   --   CALCIUM 8.8*  --  8.8*  CREATININE 10.31*  --  10.78*  K 5.9*  --  5.4*   Inpatient medications:  Chlorhexidine Gluconate Cloth  6 each Topical Q0600   heparin  5,000 Units Subcutaneous Q8H   oseltamivir  30 mg Oral Q M,W,F-HD   sodium zirconium cyclosilicate  5 g Oral Daily    acetaminophen **OR** acetaminophen, albuterol, senna-docusate

## 2022-06-14 DIAGNOSIS — G9340 Encephalopathy, unspecified: Secondary | ICD-10-CM | POA: Diagnosis not present

## 2022-06-14 LAB — HEPATITIS B SURFACE ANTIBODY, QUANTITATIVE: Hep B S AB Quant (Post): 356.4 m[IU]/mL (ref >9.9)

## 2022-06-14 MED ORDER — HEPARIN SODIUM (PORCINE) 1000 UNIT/ML DIALYSIS: 2000.0000 [IU] | INTRAMUSCULAR | Status: DC | PRN

## 2022-06-14 NOTE — TOC Initial Note (Addendum)
Transition of Care [facility]) - Initial/Assessment Note    Patient Details  Name: Christopher Davila MRN: [id number] Date of Birth: 1971/03/15  Transition of Care [facility]) CM/SW Contact:    Joanne Chars, LCSW Phone Number: 06/14/2022, 3:38 PM  Clinical Narrative:     Pt continues to be oriented x1, CSW spoke with pt cousing/POA [name].  She confirmed pt is from [place], plan for him to return at DC.               No PT order at this time, unclear if pt will be evaluated by PT. PASSR: [id number].  Confirmed with Kristal/[place] that pt will return.   Expected Discharge Plan: [place] Barriers to Discharge: Continued Medical Work up   Patient Goals and CMS Choice     Choice offered to / list presented to : [facility] POA / Guardian (cousin Ambrose Wile)      Expected Discharge Plan and Services In-house Referral: Clinical Social Work   Post Acute Care Choice: [place] Living arrangements for the past 2 months: [place]                                      Prior Living Arrangements/Services Living arrangements for the past 2 months: [place] Lives with:: Facility Resident          Need for Family Participation in Patient Care: Yes (Comment) Care giver support system in place?: Yes (comment) Current home services: Other (comment) (na) Criminal Activity/Legal Involvement Pertinent to Current Situation/Hospitalization: No - Comment as needed  Activities of Daily Living      Permission Sought/Granted                  Emotional Assessment Appearance::  (pt not available, in HD)            Admission diagnosis:  Encephalopathy [D66.44] Acute metabolic encephalopathy [I34.74] Patient Active Problem List   Diagnosis Date Noted   Acute metabolic encephalopathy 25/95/6387   Acute encephalopathy 06/12/2022   Pleural effusion, bilateral 06/12/2022   Hyperkalemia 06/12/2022    Chronic respiratory failure with hypoxia ([place]) 06/12/2022   Generalized weakness 11/05/2021   Pain due to onychomycosis of toenails of both feet 07/31/2021   Skin lesion 07/31/2021   End-stage renal disease on hemodialysis [facility])    History of thoracentesis    Pressure injury of skin 11/06/2019   ESRD on dialysis [facility])    Heart failure with preserved ejection fraction ([place])    Goals of care, counseling/discussion    Weakness 10/05/2019   Vision loss of left eye 09/21/2019   Homeless 09/19/2019   Anemia of chronic disease 09/19/2019   Vision loss, left eye 09/19/2019   Acute loss of vision, right 06/16/2019   Alcohol use    Gastroparesis due to DM ([place])    Essential hypertension    Hyperlipidemia    Type 2 diabetes mellitus with hypoglycemia ([place]) 06/26/2012   PCP:  [name], MD Pharmacy:   [place], [place] - [address] [place] [address] Phone: [phone] Fax: [phone]     Social Determinants of Health (SDOH) Social History: SDOH Screenings   Tobacco Use: Medium Risk (06/12/2022)   SDOH Interventions:     Readmission Risk Interventions    04/06/2021    1:01 PM  Readmission Risk Prevention  Plan  Transportation Screening Complete  Medication Review Press photographer) Complete  PCP or Specialist appointment within 3-5 days of discharge Complete  HRI or Home Care Consult Complete  SW Recovery Care/Counseling Consult Complete  [place] Complete

## 2022-06-14 NOTE — Progress Notes (Signed)
PROGRESS NOTE Christopher Davila  PYK:[id number] DOB: 1971/03/20 DOA: 06/12/2022 PCP: [name], MD   Brief Narrative/Hospital Course: a 51 year old male nursing home resident.  He has a history of  ESRD on HD MWF, hypertension, anemia ESRD, metabolic bone disease history of alcohol use chronic diastolic CHF, chronic respiratory failure 2 L, blindness, gastroparesis and peripheral neuropathy, followed by [facility] outpatient palliative care/hospice in the facility at baseline alert awake oriented x 3 but has been having fever, and confused so sent to the hospital for workup showed encephalopathy with influenza positive.  Chest x-ray with pulmonary edema, seen by nephrology managed with dialysis, placed on Tamiflu     Subjective: Seen and examined this morning  overnight Tmax 100.1 , 7 PM yesterday, BP 160s to 170s on 3 L nasal cannula Labs reviewed potassium 5.4 on 12/28 Seen this am He is alert is awake oriented to place, president, current month nursing reports he appears overall much better from admission   Assessment and Plan: Principal Problem:   Acute encephalopathy Active Problems:   Type 2 diabetes mellitus with hypoglycemia ([place])   Essential hypertension   Hyperlipidemia   Gastroparesis due to DM ([place])   Alcohol use   ESRD on dialysis ([place])   Heart failure with preserved ejection fraction ([place])   History of thoracentesis   Pleural effusion, bilateral   Hyperkalemia   Chronic respiratory failure with hypoxia ([place])   Acute metabolic encephalopathy  Acute toxic encephalopathy Influenza infection: In the setting of influenza A infection.  He is much more alert awake oriented now but still some wheezing. Previous attending spoke to his healthcare agent Zygmunt Mcglinn who happens to be his cousin as well and informed that he lives in an ALF and typically patient is fully alert and oriented and knows everything that goes around him.  Overall mental status improving.  Continue Tamiflu,  respiratory support antitussives.   ESRD on HD/hyperkalemia: Nephrology on board.  Patient is for HD today. Hyperkalemia: Got dialyzed yesterday.  Monitor.  Continue Lokelma per nephrology Recent Labs  Lab 06/12/22 2108 06/12/22 2121 06/13/22 0456  K 5.7*  5.7* 5.9* 5.4*    Metabolic bone disease: Calcium and Phos anemia of chronic renal disease: Monitor hemoglobin  Type 2 diabetes mellitus: Blood sugar stable, continue SSI. Recent Labs  Lab 06/13/22 0419  GLUCAP 85    Essential hypertension: BP now fairly controlled, continue home amlodipine, hydralazine losartan torsemide Hyperlipidemia continue statin.   DVT prophylaxis: heparin injection 5,000 Units Start: 06/13/22 0600 SCDs Start: 06/13/22 0428 Code Status:   Code Status: Full Code Family Communication: plan of care discussed with patient at bedside. Patient status is: inpatient  because of influenza infection Level of care: Telemetry Medical   Dispo: The patient is from: ALF            Anticipated disposition: ALF next 24 hours if remains stable Objective: Vitals last 24 hrs: Vitals:   06/13/22 1702 06/13/22 1907 06/14/22 0600 06/14/22 0938  BP: (!) 176/92 (!) 171/88 (!) 162/92 (!) 155/94  Pulse:  99 87 88  Resp:  18 16 16   Temp:  100.1 F (37.8 C) 99.5 F (37.5 C) 98 F (36.7 C)  TempSrc:  Oral Oral Oral  SpO2:  95% 97% 99%  Weight:      Height:       Weight change: 0.2 kg  Physical Examination: General exam: alert awake, older than stated age HEENT:Oral mucosa moist, Ear/Nose WNL grossly Respiratory system: bilaterally diminished breath  sounds with mild wheezing BS, no use of accessory muscle Cardiovascular system: S1 & S2 +, No JVD. Gastrointestinal system: Abdomen soft,NT,ND, BS+ Nervous System:Alert, awake, moving extremities. Extremities: LE edema neg ,distal peripheral pulses palpable.  Skin: No rashes,no icterus. MSK: Normal muscle bulk,tone, power  Medications reviewed:  Scheduled Meds:   amLODipine  5 mg Oral Daily   aspirin EC  81 mg Oral Daily   atorvastatin  40 mg Oral Daily   Chlorhexidine Gluconate Cloth  6 each Topical Q0600   Chlorhexidine Gluconate Cloth  6 each Topical Q0600   cyanocobalamin  1,000 mcg Oral Daily   dorzolamide-timolol  1 drop Left Eye BID   doxercalciferol  4 mcg Intravenous Q M,W,F-HD   ferrous sulfate  325 mg Oral Daily   heparin  5,000 Units Subcutaneous Q8H   hydrALAZINE  37.5 mg Oral TID   latanoprost  1 drop Left Eye QHS   losartan  100 mg Oral Daily   oseltamivir  30 mg Oral Q M,W,F-HD   pregabalin  25 mg Oral Daily   sodium zirconium cyclosilicate  5 g Oral Daily   torsemide  100 mg Oral BID   Continuous Infusions:    Diet Order             Diet heart healthy/carb modified Room service appropriate? Yes; Fluid consistency: Thin  Diet effective now                           No intake or output data in the 24 hours ending 06/14/22 1352 Net IO Since Admission: -3,600 mL [06/14/22 1352]  Wt Readings from Last 3 Encounters:  06/13/22 60.4 kg  05/07/22 60.8 kg  11/07/21 55 kg     Unresulted Labs (From admission, onward)     Start     Ordered   06/12/22 2048  Rapid urine drug screen (hospital performed)  Once,   STAT        06/12/22 2048          Data Reviewed: I have personally reviewed following labs and imaging studies CBC: Recent Labs  Lab 06/12/22 2108 06/12/22 2125 06/13/22 0456  WBC  --  10.5 8.3  NEUTROABS  --  7.8*  --   HGB 11.2*  10.5* 9.7* 9.8*  HCT 33.0*  31.0* 30.3* 30.7*  MCV  --  95.3 93.6  PLT  --  144* 409*   Basic Metabolic Panel: Recent Labs  Lab 06/12/22 2108 06/12/22 2121 06/13/22 0456 06/13/22 2022  NA 133*  133* 135 135  --   K 5.7*  5.7* 5.9* 5.4*  --   CL 97* 93* 94*  --   CO2  --  22 21*  --   GLUCOSE 117* 119* 90  --   BUN 79* 83* 87*  --   CREATININE 11.50* 10.31* 10.78*  --   CALCIUM  --  8.8* 8.8*  --   PHOS  --   --   --  7.3*   GFR: Estimated  Creatinine Clearance: 6.6 mL/min (A) (by C-G formula based on SCr of 10.78 mg/dL (H)). Liver Function Tests: Recent Labs  Lab 06/12/22 2121  AST 39  ALT 35  ALKPHOS 99  BILITOT 1.1  PROT 7.3  ALBUMIN 3.5   No results for input(s): "LIPASE", "AMYLASE" in the last 168 hours. Recent Labs  Lab 06/12/22 2125  AMMONIA 29  CBG: Recent Labs  Lab 06/13/22 [place]  85   Recent Results (from the past 240 hour(s))  Resp panel by RT-PCR (RSV, Flu A&B, Covid) Anterior Nasal Swab     Status: Abnormal   Collection Time: 06/12/22 10:45 PM   Specimen: Anterior Nasal Swab  Result Value Ref Range Status   SARS Coronavirus 2 by RT PCR NEGATIVE NEGATIVE Final    Comment: (NOTE) SARS-CoV-2 target nucleic acids are NOT DETECTED.  The SARS-CoV-2 RNA is generally detectable in upper respiratory specimens during the acute phase of infection. The lowest concentration of SARS-CoV-2 viral copies this assay can detect is 138 copies/mL. A negative result does not preclude SARS-Cov-2 infection and should not be used as the sole basis for treatment or other patient management decisions. A negative result may occur with  improper specimen collection/handling, submission of specimen other than nasopharyngeal swab, presence of viral mutation(s) within the areas targeted by this assay, and inadequate number of viral copies(<138 copies/mL). A negative result must be combined with clinical observations, patient history, and epidemiological information. The expected result is Negative.  Fact Sheet for Patients:  EntrepreneurPulse.com.au  Fact Sheet for Healthcare Providers:  IncredibleEmployment.be  This test is no t yet approved or cleared by the [place] FDA and  has been authorized for detection and/or diagnosis of SARS-CoV-2 by FDA under an Emergency Use Authorization (EUA). This EUA will remain  in effect (meaning this test can be used) for the duration  of the COVID-19 declaration under Section 564(b)(1) of the Act, 21 U.S.C.section 360bbb-3(b)(1), unless the authorization is terminated  or revoked sooner.       Influenza A by PCR POSITIVE (A) NEGATIVE Final   Influenza B by PCR NEGATIVE NEGATIVE Final    Comment: (NOTE) The Xpert Xpress SARS-CoV-2/FLU/RSV plus assay is intended as an aid in the diagnosis of influenza from Nasopharyngeal swab specimens and should not be used as a sole basis for treatment. Nasal washings and aspirates are unacceptable for Xpert Xpress SARS-CoV-2/FLU/RSV testing.  Fact Sheet for Patients: EntrepreneurPulse.com.au  Fact Sheet for Healthcare Providers: IncredibleEmployment.be  This test is not yet approved or cleared by the [place] FDA and has been authorized for detection and/or diagnosis of SARS-CoV-2 by FDA under an Emergency Use Authorization (EUA). This EUA will remain in effect (meaning this test can be used) for the duration of the COVID-19 declaration under Section 564(b)(1) of the Act, 21 U.S.C. section 360bbb-3(b)(1), unless the authorization is terminated or revoked.     Resp Syncytial Virus by PCR NEGATIVE NEGATIVE Final    Comment: (NOTE) Fact Sheet for Patients: EntrepreneurPulse.com.au  Fact Sheet for Healthcare Providers: IncredibleEmployment.be  This test is not yet approved or cleared by the [place] FDA and has been authorized for detection and/or diagnosis of SARS-CoV-2 by FDA under an Emergency Use Authorization (EUA). This EUA will remain in effect (meaning this test can be used) for the duration of the COVID-19 declaration under Section 564(b)(1) of the Act, 21 U.S.C. section 360bbb-3(b)(1), unless the authorization is terminated or revoked.  Performed at [facility], [place] [address], [place], [place] [address]     Antimicrobials: Anti-infectives (From admission, onward)     Start     Dose/Rate Route Frequency Ordered Stop   06/13/22 0600  oseltamivir (TAMIFLU) 6 MG/ML suspension 30 mg       Note to Pharmacy: Tamiflu 30 mg after each HD for ESRD on HD   30 mg Oral Every M-W-F (Hemodialysis) 06/13/22 0307 06/19/22 1159  Culture/Microbiology    Component Value Date/Time   SDES BLOOD LEFT ARM 05/07/2022 1602   SPECREQUEST  05/07/2022 1602    BOTTLES DRAWN AEROBIC AND ANAEROBIC Blood Culture adequate volume   CULT  05/07/2022 1602    NO GROWTH 5 DAYS Performed at [facility], [place] [address], [place],  [address]    REPTSTATUS 05/12/2022 FINAL 05/07/2022 1602  Radiology Studies: DG Chest Portable 1 View  Result Date: 06/12/2022 CLINICAL DATA:  Shortness of breath. EXAM: PORTABLE CHEST 1 VIEW COMPARISON:  Chest radiograph dated May 07, 2022 FINDINGS: The heart is enlarged. Pulmonary vascular congestion. Low lung volumes with bilateral lower lobe opacity suggesting moderate bilateral pleural effusions, right greater than the left. No acute osseous abnormality. IMPRESSION: 1. Cardiomegaly with pulmonary vascular congestion. 2. Moderate bilateral pleural effusions, right greater than left, findings likely sequela of congestive heart failure. Clinical correlation is suggested. Electronically Signed   By: Keane Police D.O.   On: 06/12/2022 21:54   CT HEAD WO CONTRAST  Result Date: 06/12/2022 CLINICAL DATA:  Dialysis patient with neuro deficit. Stroke suspected. EXAM: CT HEAD WITHOUT CONTRAST TECHNIQUE: Contiguous axial images were obtained from the base of the skull through the vertex without intravenous contrast. RADIATION DOSE REDUCTION: This exam was performed according to the departmental dose-optimization program which includes automated exposure control, adjustment of the mA and/or kV according to patient size and/or use of iterative reconstruction technique. COMPARISON:  Head CT 11/05/2021 FINDINGS: Brain: Again noted are mild cerebral  atrophy and atrophic ventriculomegaly and moderately advanced, age-advanced small-vessel disease of the cerebral white matter. There is only slight cerebellar atrophy. No new asymmetry is seen worrisome for an acute infarct, hemorrhage or mass. There are benign dural calcifications scattered along the falx. No midline shift is seen. Basal cisterns are clear. Vascular: Scattered calcification both siphons. No hyperdense central vessels. Skull: The calvarium, skull base and orbits are intact. No skull lesion is seen. Sinuses/Orbits: There are retention cysts in the maxillary sinuses. Visualized sinuses are otherwise clear. There is right-sided deviation and spurring of the nasal septum. There is no mastoid effusion. Chronic bilateral vitreous hemorrhage is again noted with right phthisis bulbi. Other: None. IMPRESSION: 1. No acute intracranial CT findings or interval changes. 2. Atrophy and age-advanced small-vessel disease. 3. 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 Electronically Signed   By: Telford Nab M.D.   On: 06/12/2022 21:23     LOS: 1 day   Antonieta Pert, MD [facility]  06/14/2022, 1:52 PM

## 2022-06-14 NOTE — Progress Notes (Signed)
Patient alert talking after HD treatment. Vitals signs taken and not abnormal from baseline.

## 2022-06-14 NOTE — Progress Notes (Signed)
[facility] Progress Note  Subjective: seen in room, seems better, no c/o's  Vitals:   06/13/22 1702 06/13/22 1907 06/14/22 0600 06/14/22 0938  BP: (!) 176/92 (!) 171/88 (!) 162/92 (!) 155/94  Pulse:  99 87 88  Resp:  18 16 16   Temp:  100.1 F (37.8 C) 99.5 F (37.5 C) 98 F (36.7 C)  TempSrc:  Oral Oral Oral  SpO2:  95% 97% 99%  Weight:      Height:        Exam: Gen alert, no distress No jvd or bruits Chest clear bilat to bases RRR no MRG Abd soft ntnd no mass or ascites +bs Ext no LE / UE edema Neuro is alert, Ox 3 , nf    RUA AVF+bruit        Home meds include - norvasc 5, asa, lipitor, hydralazine 37.5mg  tid, losartan 100 qd, lyrica, torsemide 100 bid, vits/ supps/ prns        OP HD: MWF [place] 4h 400/ 600  60kg  2/2 bath  RUA AVF  Heparin 3000 - last HD 12/24, post wt 62.0kg - hectorol 4 ug IV tiw - mircera 120 mcg q4, last 12/8, due 1/05 - last Hb 10.0   CXR - bilat pulm edema   Assessment/ Plan: AMS - due to infection, stress, ESRD. Better.  Influenza A - per pmd Pulm edema - by CXR on admit. Better clinically today after 3.5 L net UF w/ HD yesterday.  ESRD - on HD MWF. HD yest and plan HD again today.  HTN/ volume - BP's on the high side, down to dry wt today. Lower dry wt w/ HD today as tolerated.  Anemia esrd - Hb 9-10 range, follow MBD ckd - CCa in range, phos is high. Cont IV vdra and binder Hx etoh abuse DM2/ gastroparesis Debility - lives in [place] 06/14/2022, 11:55 AM   Recent Labs  Lab 06/12/22 2121 06/12/22 2125 06/13/22 0456 06/13/22 2022  HGB  --  9.7* 9.8*  --   ALBUMIN 3.5  --   --   --   CALCIUM 8.8*  --  8.8*  --   PHOS  --   --   --  7.3*  CREATININE 10.31*  --  10.78*  --   K 5.9*  --  5.4*  --    No results for input(s): "IRON", "TIBC", "FERRITIN" in the last 168 hours. Inpatient medications:  amLODipine  5 mg Oral Daily   aspirin EC  81 mg Oral Daily   atorvastatin  40 mg Oral Daily    Chlorhexidine Gluconate Cloth  6 each Topical Q0600   Chlorhexidine Gluconate Cloth  6 each Topical Q0600   cyanocobalamin  1,000 mcg Oral Daily   dorzolamide-timolol  1 drop Left Eye BID   doxercalciferol  4 mcg Intravenous Q M,W,F-HD   ferrous sulfate  325 mg Oral Daily   heparin  5,000 Units Subcutaneous Q8H   hydrALAZINE  37.5 mg Oral TID   latanoprost  1 drop Left Eye QHS   losartan  100 mg Oral Daily   oseltamivir  30 mg Oral Q M,W,F-HD   pregabalin  25 mg Oral Daily   sodium zirconium cyclosilicate  5 g Oral Daily   torsemide  100 mg Oral BID    acetaminophen **OR** acetaminophen, albuterol, senna-docusate

## 2022-06-14 NOTE — Progress Notes (Addendum)
Received patient in his room,transporting to [facility] unit for his treatment.Awake,alert to self.keep his eyes closed thought verbally responsive and follows commands.Vitals stable with oxygen at 3 lpm/[place], per nurse ,its chronic for him.  Access used; Left upper arm fistula that works well during treatment.  Duration of treatment: 2 hours and 45 minutes as per ordered.  Medicine given.None  Fluid removed:2,800 cc  Hemodialysis treatment issue. None  Hands off to the patient's nurse.

## 2022-06-14 NOTE — Hospital Course (Addendum)
a 51 year old male nursing home resident.  He has a history of  ESRD on HD MWF, hypertension, anemia ESRD, metabolic bone disease history of alcohol use chronic diastolic CHF, chronic respiratory failure 2 L, blindness, gastroparesis and peripheral neuropathy, followed by [facility] outpatient palliative care/hospice in the facility at baseline alert awake oriented x 3 but has been having fever, and confused so sent to the hospital for workup showed encephalopathy with influenza positive.Chest x-ray with pulmonary edema, seen by nephrology managed with dialysis, placed on Tamiflu. Overall patient is significantly improved he is alert and oriented at baseline interactive. Tolerating diet.Having chronic pain. If his labs appear stable -- he will be discharged back to ALF today

## 2022-06-14 NOTE — Progress Notes (Signed)
Pt receives out-pt HD at [facility] on MWF. Pt admitted from snf. Will assist as needed.   Melven Sartorius Renal Navigator [phone]

## 2022-06-15 DIAGNOSIS — G9340 Encephalopathy, unspecified: Secondary | ICD-10-CM | POA: Diagnosis not present

## 2022-06-15 LAB — BASIC METABOLIC PANEL
Anion gap: 18 — ABNORMAL HIGH (ref 5–15)
BUN: 47 mg/dL — ABNORMAL HIGH (ref 6–20)
CO2: 24 mmol/L (ref 22–32)
Calcium: 9.4 mg/dL (ref 8.9–10.3)
Chloride: 92 mmol/L — ABNORMAL LOW (ref 98–111)
Creatinine, Ser: 6.9 mg/dL — ABNORMAL HIGH (ref 0.61–1.24)
GFR, Estimated: 9 mL/min — ABNORMAL LOW (ref >60)
Glucose, Bld: 129 mg/dL — ABNORMAL HIGH (ref 70–99)
Potassium: 4 mmol/L (ref 3.5–5.1)
Sodium: 134 mmol/L — ABNORMAL LOW (ref 135–145)

## 2022-06-15 LAB — GLUCOSE, CAPILLARY: Glucose-Capillary: 139 mg/dL — ABNORMAL HIGH (ref 70–99)

## 2022-06-15 MED ORDER — OSELTAMIVIR PHOSPHATE 6 MG/ML PO SUSR: 30.0000 mg | ORAL | 0 refills | Status: AC

## 2022-06-15 MED ORDER — SODIUM ZIRCONIUM CYCLOSILICATE 5 G PO PACK: 5.0000 g | PACK | Freq: Every day | ORAL | 0 refills | Status: AC

## 2022-06-15 MED ORDER — HYDROCODONE-ACETAMINOPHEN 5-325 MG PO TABS
1.0000 | ORAL_TABLET | Freq: Three times a day (TID) | ORAL | Status: DC | PRN
  Administered 2022-06-15: 1 via ORAL
  Filled 2022-06-15: qty 1

## 2022-06-15 MED ORDER — FERROUS SULFATE 325 (65 FE) MG PO TABS: 325.0000 mg | ORAL_TABLET | Freq: Every day | ORAL | 0 refills | Status: DC

## 2022-06-15 MED ORDER — METOPROLOL TARTRATE 12.5 MG HALF TABLET: 12.5000 mg | ORAL_TABLET | Freq: Two times a day (BID) | ORAL | Status: DC

## 2022-06-15 NOTE — Progress Notes (Addendum)
[facility] Progress Note   Subjective:   Sleeping with head under blanket, awakens to voice and answers ROS questions appropriately. Denies SOB, CP, dizziness and nausea. Had brief HD yesterday with 2.8L UF.   Objective Vitals:   06/14/22 1742 06/14/22 2015 06/15/22 0905 06/15/22 0906  BP: (!) 148/84 (!) 146/93 (!) 156/83 (!) 156/83  Pulse: 86 86  80  Resp: 15 20  16   Temp: 98.5 F (36.9 C) 98.7 F (37.1 C)  98.3 F (36.8 C)  TempSrc: Oral Oral  Oral  SpO2: 100% 100%  100%  Weight:      Height:       Physical Exam General: Alert male, eyes closed, NAD Heart: RRR, no murmurs, rubs or gallops Lungs: CTA bilaterally without wheezing, rhonchi or rales Abdomen: Soft, non-distended, +BS Extremities: No edema b/l lower extremities  Dialysis Access: RUE AVF + bruit  Additional Objective Labs: Basic Metabolic Panel: Recent Labs  Lab 06/12/22 2108 06/12/22 2121 06/13/22 0456 06/13/22 2022  NA 133*  133* 135 135  --   K 5.7*  5.7* 5.9* 5.4*  --   CL 97* 93* 94*  --   CO2  --  22 21*  --   GLUCOSE 117* 119* 90  --   BUN 79* 83* 87*  --   CREATININE 11.50* 10.31* 10.78*  --   CALCIUM  --  8.8* 8.8*  --   PHOS  --   --   --  7.3*   Liver Function Tests: Recent Labs  Lab 06/12/22 2121  AST 39  ALT 35  ALKPHOS 99  BILITOT 1.1  PROT 7.3  ALBUMIN 3.5   No results for input(s): "LIPASE", "AMYLASE" in the last 168 hours. CBC: Recent Labs  Lab 06/12/22 2108 06/12/22 2125 06/13/22 0456  WBC  --  10.5 8.3  NEUTROABS  --  7.8*  --   HGB 11.2*  10.5* 9.7* 9.8*  HCT 33.0*  31.0* 30.3* 30.7*  MCV  --  95.3 93.6  PLT  --  144* 147*   Blood Culture    Component Value Date/Time   SDES BLOOD LEFT ARM 05/07/2022 1602   SPECREQUEST  05/07/2022 1602    BOTTLES DRAWN AEROBIC AND ANAEROBIC Blood Culture adequate volume   CULT  05/07/2022 1602    NO GROWTH 5 DAYS Performed at [facility], [place] [address] [place], [place] [address]     REPTSTATUS 05/12/2022 FINAL 05/07/2022 1602    Cardiac Enzymes: No results for input(s): "CKTOTAL", "CKMB", "CKMBINDEX", "TROPONINI" in the last 168 hours. CBG: Recent Labs  Lab 06/13/22 0419  GLUCAP 85   Iron Studies: No results for input(s): "IRON", "TIBC", "TRANSFERRIN", "FERRITIN" in the last 72 hours. @lablastinr3 @ Studies/Results: No results found. Medications:   amLODipine  5 mg Oral Daily   aspirin EC  81 mg Oral Daily   atorvastatin  40 mg Oral Daily   Chlorhexidine Gluconate Cloth  6 each Topical Q0600   Chlorhexidine Gluconate Cloth  6 each Topical Q0600   cyanocobalamin  1,000 mcg Oral Daily   dorzolamide-timolol  1 drop Left Eye BID   doxercalciferol  4 mcg Intravenous Q M,W,F-HD   ferrous sulfate  325 mg Oral Daily   heparin  5,000 Units Subcutaneous Q8H   hydrALAZINE  37.5 mg Oral TID   latanoprost  1 drop Left Eye QHS   losartan  100 mg Oral Daily   oseltamivir  30 mg Oral Q M,W,F-HD   pregabalin  25 mg Oral Daily   sodium zirconium cyclosilicate  5 g Oral Daily   torsemide  100 mg Oral BID    Outpatient Dialysis Orders: [facility] 4h 400/ 600  60kg  2/2 bath  RUA AVF  Heparin 3000 - last HD 12/24, post wt 62.0kg - hectorol 4 ug IV tiw - mircera 120 mcg q4, last 12/8, due 1/05 - last Hb 10.0  Assessment/Plan: AMS - due to infection/ESRD. Seems to be improving.  2. Influenza A - received tamiflu. Denies SOB. Management per primary team.  3. Pulm edema - by CXR on admit. Better clinically today after 3.5 L net UF w/ HD yesterday.  4. ESRD - on HD MWF. S/p serial HD. Weights increased but suspect this is not accurate, has had 5.5L UF over the past few days. HD unit is on a holiday schedule. Mr Longsworth will have dialysis Sunday, Wednesday and Friday this week.  5. HTN/ volume - See above. Continue max UF as tolerated, next HD Monday. 6. Anemia esrd - Hb 9-10 range, not due for ESA yet 7. MBD ckd - CCa in range, phos is high. Cont IV vdra and binder 8.  DM2/ gastroparesis- management per primary team 9. Debility - lives in [place], [place] 06/15/2022, 9:08 AM  [place] Pager: [phone]

## 2022-06-15 NOTE — Progress Notes (Addendum)
Patient can return to [facility] today. The number to call for report is [phone]. RN to call [facility] and report when ready. Patient will go to room 409A.  Madilyn Fireman, MSW, LCSW Transitions of Care  Clinical Social Worker II [phone]

## 2022-06-15 NOTE — Progress Notes (Signed)
Reached out to [facility] to get an ETA.  Was told that there is two other pick ups in front of him.  Made the pt aware of the delay.

## 2022-06-15 NOTE — Progress Notes (Signed)
Called report to the [facility] facility and spoke with [place], house supervisor to make him aware that the pt will be transported back to the facility via [facility].  Also told the rep that the dr is reminding them that the pt is to attend dialysis on Sunday instead of Monday due to the holiday.  Pt was also informed of the change in schedule for HD.  Called [facility] and made transport arrangements and was given a ETA of 1500pm.

## 2022-06-15 NOTE — Progress Notes (Signed)
Discharge completed by day shift nurse. Patient has been discharged during shift change. No further questions from patient

## 2022-06-15 NOTE — Plan of Care (Signed)
  Problem: Education: Goal: Knowledge of General Education information will improve Description: Including pain rating scale, medication(s)/side effects and non-pharmacologic comfort measures Outcome: Progressing   Problem: Health Behavior/Discharge Planning: Goal: Ability to manage health-related needs will improve 06/15/2022 0026 by Miki Kins, RN Outcome: Progressing 06/14/2022 2320 by Miki Kins, RN Outcome: Progressing   Problem: Nutrition: Goal: Adequate nutrition will be maintained Outcome: Progressing   Problem: Coping: Goal: Level of anxiety will decrease 06/15/2022 0026 by Miki Kins, RN Outcome: Progressing 06/14/2022 2320 by Miki Kins, RN Outcome: Progressing

## 2022-06-15 NOTE — Discharge Summary (Signed)
Physician Discharge Summary  Kallon Caylor SAY:[id number] DOB: [birth year] DOA: 06/12/2022  PCP: [name], MD  Admit date: 06/12/2022 Discharge date: 06/15/2022 Recommendations for Outpatient Follow-up:  Follow up with PCP in 1 weeks-call for appointment Please obtain BMP/CBC in one week  Discharge Dispo: ALF Discharge Condition: Stable Code Status:   Code Status: Full Code Diet recommendation:  Diet Order             Diet heart healthy/carb modified Room service appropriate? Yes; Fluid consistency: Thin  Diet effective now                    Brief/Interim Summary: a 51 year old male nursing home resident.  He has a history of  ESRD on HD MWF, hypertension, anemia ESRD, metabolic bone disease history of alcohol use chronic diastolic CHF, chronic respiratory failure 2 L, blindness, gastroparesis and peripheral neuropathy, followed by [facility] outpatient palliative care/hospice in the facility at baseline alert awake oriented x 3 but has been having fever, and confused so sent to the hospital for workup showed encephalopathy with influenza positive.Chest x-ray with pulmonary edema, seen by nephrology managed with dialysis, placed on Tamiflu. Overall patient is significantly improved he is alert and oriented at baseline interactive. Tolerating diet.Having chronic pain. If his labs appear stable -- he will be discharged back to ALF today    Discharge Diagnoses:  Principal Problem:   Acute encephalopathy Active Problems:   Type 2 diabetes mellitus with hypoglycemia (HCC)   Essential hypertension   Hyperlipidemia   Gastroparesis due to DM ([place])   Alcohol use   ESRD on dialysis ([place])   Heart failure with preserved ejection fraction (HCC)   History of thoracentesis   Pleural effusion, bilateral   Hyperkalemia   Chronic respiratory failure with hypoxia ([place])   Acute metabolic encephalopathy  Acute toxic encephalopathy Influenza infection: In the setting of influenza A  infection.  He is much more alert awake oriented now but still some wheezing. Previous attending spoke to his healthcare agent Suren Payne who happens to be his cousin as well and informed that he lives in an ALF and typically patient is fully alert and oriented and knows everything that goes around him.  Overall mental status improved. Continue Tamiflu mwf 30 MG 2 MORE DOSES.  ESRD on HD/hyperkalemia: Nephrology on board. Had HD 12/29 Hyperkalemia: Got dialyzed - recheck prior to dc. Cont lokelma Recent Labs  Lab 06/12/22 2108 06/12/22 2121 06/13/22 0456  K 5.7*  5.7* 5.9* 5.4*     Metabolic bone disease: Calcium and Phos anemia of chronic renal disease: Monitor hemoglobin  Type 2 diabetes mellitus: Blood sugar stable, continue SSI.  Essential hypertension: BP now fairly controlled, continue home amlodipine, hydralazine losartan and torsemide Hyperlipidemia continue statin.  Consults: Nephrology Subjective: Alert awake oriented resting comfortably no complaints.  Agreeable for discharge back to facility today  Discharge Exam: Vitals:   06/15/22 0905 06/15/22 0906  BP: (!) 156/83 (!) 156/83  Pulse:  80  Resp:  16  Temp:  98.3 F (36.8 C)  SpO2:  100%   General: Pt is alert, awake, not in acute distress Cardiovascular: RRR, S1/S2 +, no rubs, no gallops Respiratory: CTA bilaterally, no wheezing, no rhonchi Abdominal: Soft, NT, ND, bowel sounds + Extremities: no edema, no cyanosis  Discharge Instructions  Discharge Instructions     Discharge instructions   Complete by: As directed    Please call call MD or return to ER for similar or worsening recurring  problem that brought you to hospital or if any fever,nausea/vomiting,abdominal pain, uncontrolled pain, chest pain,  shortness of breath or any other alarming symptoms.  Please follow-up your doctor as instructed in a week time and call the office for appointment.  Please avoid alcohol, smoking, or any other illicit  substance and maintain healthy habits including taking your regular medications as prescribed.  You were cared for by a hospitalist during your hospital stay. If you have any questions about your discharge medications or the care you received while you were in the hospital after you are discharged, you can call the unit and ask to speak with the hospitalist on call if the hospitalist that took care of you is not available.  Once you are discharged, your primary care physician will handle any further medical issues. Please note that NO REFILLS for any discharge medications will be authorized once you are discharged, as it is imperative that you return to your primary care physician (or establish a relationship with a primary care physician if you do not have one) for your aftercare needs so that they can reassess your need for medications and monitor your lab values   Increase activity slowly   Complete by: As directed       Allergies as of 06/15/2022       Reactions   Morphine And Related Itching, Other (See Comments)   Pt prefers not to be given this drug        Medication List     TAKE these medications    acetaminophen 650 MG suppository Commonly known as: TYLENOL Place 650 mg rectally every 6 (six) hours as needed for mild pain or moderate pain.   albuterol 108 (90 Base) MCG/ACT inhaler Commonly known as: VENTOLIN HFA Inhale 2 puffs into the lungs every 4 (four) hours as needed for shortness of breath.   amLODipine 5 MG tablet Commonly known as: NORVASC Take 5-10 mg by mouth See admin instructions. Take 10mg  by mouth daily every Tuesday, Thursday, Saturday and Sunday. Take 5mg  by mouth every Monday, Wednesday, and Friday.   aspirin EC 81 MG tablet Take 1 tablet (81 mg total) by mouth daily.   atorvastatin 40 MG tablet Commonly known as: Lipitor Take 1 tablet (40 mg total) by mouth daily.   Atropine Sulfate 0.01 % Soln Place 1 drop into both eyes in the morning and at  bedtime.   brimonidine 0.1 % Soln Commonly known as: ALPHAGAN P Place 1 drop into the left eye in the morning and at bedtime.   calcium acetate 667 MG capsule Commonly known as: PHOSLO Take 1 capsule (667 mg total) by mouth 3 (three) times daily with meals.   capsaicin 0.025 % cream Commonly known as: ZOSTRIX Apply 1 application  topically every 8 (eight) hours as needed (pain). Apply to R & L knees, R & L shoulders, R & L wrists, R & L dorsal surfaces on hands, and upper or lower back.   cyanocobalamin 1000 MCG tablet Take 1 tablet (1,000 mcg total) by mouth daily.   dorzolamide-timolol 2-0.5 % ophthalmic solution Commonly known as: COSOPT Place 1 drop into the left eye 2 (two) times daily.   famotidine 20 MG tablet Commonly known as: PEPCID Take 20 mg by mouth daily.   feeding supplement (NEPRO CARB STEADY) Liqd Take 120 mLs by mouth 2 (two) times daily.   ferrous sulfate 325 (65 FE) MG tablet Take 1 tablet (325 mg total) by mouth daily.   guaiFENesin  100 MG/5ML liquid Commonly known as: ROBITUSSIN Take 100 mg by mouth every 4 (four) hours as needed for cough.   hydrALAZINE 25 MG tablet Commonly known as: APRESOLINE Take 25 mg by mouth 3 (three) times daily.   hyoscyamine 0.125 MG Tbdp disintergrating tablet Commonly known as: ANASPAZ Place 0.125 mg under the tongue every 4 (four) hours as needed (excess secretions).   latanoprost 0.005 % ophthalmic solution Commonly known as: XALATAN Place 1 drop into the left eye at bedtime.   losartan 100 MG tablet Commonly known as: COZAAR Take 1 tablet (100 mg total) by mouth daily.   Melatonin 10 MG Caps Take 10 mg by mouth at bedtime.   Menthol (Topical Analgesic) 5 % Gel Apply 1 application. topically every 8 (eight) hours as needed (hand/wrist pain).   metolazone 5 MG tablet Commonly known as: ZAROXOLYN Take 5 mg by mouth daily.   olopatadine 0.1 % ophthalmic solution Commonly known as: PATANOL Place 2 drops  into both eyes daily.   ondansetron 4 MG tablet Commonly known as: ZOFRAN Take 4 mg by mouth every 6 (six) hours as needed for nausea or vomiting.   oseltamivir 6 MG/ML Susr suspension Commonly known as: TAMIFLU Take 5 mLs (30 mg total) by mouth every Monday, Wednesday, and Friday with hemodialysis for 2 doses. Start taking on: June 17, 2022   oxyCODONE-acetaminophen 5-325 MG tablet Commonly known as: PERCOCET/ROXICET Take 1 tablet by mouth See admin instructions. Take 1 tablet by mouth two times a day every Tuesday, Thursday, Saturday and Sunday for pain. What changed: Another medication with the same name was removed. Continue taking this medication, and follow the directions you see here.   OXYGEN Inhale 3 L into the lungs continuous.   polyethylene glycol 17 g packet Commonly known as: MIRALAX / GLYCOLAX Take 17 g by mouth every 12 (twelve) hours as needed for mild constipation or moderate constipation.   pramoxine-zinc acetate 1-0.1 % Lotn Commonly known as: CALADRYL Apply 1 application. topically every 8 (eight) hours as needed (back, torso, arms, and legs for puritus).   prednisoLONE acetate 1 % ophthalmic suspension Commonly known as: PRED FORTE Place 1 drop into both eyes 4 (four) times daily.   pregabalin 25 MG capsule Commonly known as: LYRICA Take 1 capsule (25 mg total) by mouth daily. What changed: when to take this   promethazine 12.5 MG tablet Commonly known as: PHENERGAN Take 12.5 mg by mouth every 6 (six) hours as needed for nausea or vomiting.   sennosides-docusate sodium 8.6-50 MG tablet Commonly known as: SENOKOT-S Take 2 tablets by mouth daily as needed for constipation. What changed: Another medication with the same name was removed. Continue taking this medication, and follow the directions you see here.   sodium zirconium cyclosilicate 5 g packet Commonly known as: LOKELMA Take 5 g by mouth daily for 14 days. Start taking on: June 16, 2022   Tears Pure 0.1-0.3 % Soln Generic drug: Dextran 70-Hypromellose Place 1 drop into both eyes in the morning, at noon, in the evening, and at bedtime.   torsemide 100 MG tablet Commonly known as: DEMADEX Take 100 mg by mouth 2 (two) times daily.   Vitamin D (Ergocalciferol) 1.25 MG (50000 UNIT) Caps capsule Commonly known as: DRISDOL Take 50,000 Units by mouth every Sunday.        Follow-up Information     [name], MD Follow up in 1 week(s).   Specialty: Family Medicine Contact information: [address]  [place] [address] [phone]                Allergies  Allergen Reactions   Morphine And Related Itching and Other (See Comments)    Pt prefers not to be given this drug    The results of significant diagnostics from this hospitalization (including imaging, microbiology, ancillary and laboratory) are listed below for reference.    Microbiology: Recent Results (from the past 240 hour(s))  Resp panel by RT-PCR (RSV, Flu A&B, Covid) Anterior Nasal Swab     Status: Abnormal   Collection Time: 06/12/22 10:45 PM   Specimen: Anterior Nasal Swab  Result Value Ref Range Status   SARS Coronavirus 2 by RT PCR NEGATIVE NEGATIVE Final    Comment: (NOTE) SARS-CoV-2 target nucleic acids are NOT DETECTED.  The SARS-CoV-2 RNA is generally detectable in upper respiratory specimens during the acute phase of infection. The lowest concentration of SARS-CoV-2 viral copies this assay can detect is 138 copies/mL. A negative result does not preclude SARS-Cov-2 infection and should not be used as the sole basis for treatment or other patient management decisions. A negative result may occur with  improper specimen collection/handling, submission of specimen other than nasopharyngeal swab, presence of viral mutation(s) within the areas targeted by this assay, and inadequate number of viral copies(<138 copies/mL). A negative result must be combined with clinical  observations, patient history, and epidemiological information. The expected result is Negative.  Fact Sheet for Patients:  EntrepreneurPulse.com.au  Fact Sheet for Healthcare Providers:  IncredibleEmployment.be  This test is no t yet approved or cleared by the [place] FDA and  has been authorized for detection and/or diagnosis of SARS-CoV-2 by FDA under an Emergency Use Authorization (EUA). This EUA will remain  in effect (meaning this test can be used) for the duration of the COVID-19 declaration under Section 564(b)(1) of the Act, 21 U.S.C.section 360bbb-3(b)(1), unless the authorization is terminated  or revoked sooner.       Influenza A by PCR POSITIVE (A) NEGATIVE Final   Influenza B by PCR NEGATIVE NEGATIVE Final    Comment: (NOTE) The Xpert Xpress SARS-CoV-2/FLU/RSV plus assay is intended as an aid in the diagnosis of influenza from Nasopharyngeal swab specimens and should not be used as a sole basis for treatment. Nasal washings and aspirates are unacceptable for Xpert Xpress SARS-CoV-2/FLU/RSV testing.  Fact Sheet for Patients: EntrepreneurPulse.com.au  Fact Sheet for Healthcare Providers: IncredibleEmployment.be  This test is not yet approved or cleared by the [place] FDA and has been authorized for detection and/or diagnosis of SARS-CoV-2 by FDA under an Emergency Use Authorization (EUA). This EUA will remain in effect (meaning this test can be used) for the duration of the COVID-19 declaration under Section 564(b)(1) of the Act, 21 U.S.C. section 360bbb-3(b)(1), unless the authorization is terminated or revoked.     Resp Syncytial Virus by PCR NEGATIVE NEGATIVE Final    Comment: (NOTE) Fact Sheet for Patients: EntrepreneurPulse.com.au  Fact Sheet for Healthcare Providers: IncredibleEmployment.be  This test is not yet approved or cleared  by the [place] FDA and has been authorized for detection and/or diagnosis of SARS-CoV-2 by FDA under an Emergency Use Authorization (EUA). This EUA will remain in effect (meaning this test can be used) for the duration of the COVID-19 declaration under Section 564(b)(1) of the Act, 21 U.S.C. section 360bbb-3(b)(1), unless the authorization is terminated or revoked.  Performed at [facility], [place] [address], [place], [place] [address]  Procedures/Studies: DG Chest Portable 1 View  Result Date: 06/12/2022 CLINICAL DATA:  Shortness of breath. EXAM: PORTABLE CHEST 1 VIEW COMPARISON:  Chest radiograph dated May 07, 2022 FINDINGS: The heart is enlarged. Pulmonary vascular congestion. Low lung volumes with bilateral lower lobe opacity suggesting moderate bilateral pleural effusions, right greater than the left. No acute osseous abnormality. IMPRESSION: 1. Cardiomegaly with pulmonary vascular congestion. 2. Moderate bilateral pleural effusions, right greater than left, findings likely sequela of congestive heart failure. Clinical correlation is suggested. Electronically Signed   By: Keane Police D.O.   On: 06/12/2022 21:54   CT HEAD WO CONTRAST  Result Date: 06/12/2022 CLINICAL DATA:  Dialysis patient with neuro deficit. Stroke suspected. EXAM: CT HEAD WITHOUT CONTRAST TECHNIQUE: Contiguous axial images were obtained from the base of the skull through the vertex without intravenous contrast. RADIATION DOSE REDUCTION: This exam was performed according to the departmental dose-optimization program which includes automated exposure control, adjustment of the mA and/or kV according to patient size and/or use of iterative reconstruction technique. COMPARISON:  Head CT 11/05/2021 FINDINGS: Brain: Again noted are mild cerebral atrophy and atrophic ventriculomegaly and moderately advanced, age-advanced small-vessel disease of the cerebral white matter. There is only slight cerebellar  atrophy. No new asymmetry is seen worrisome for an acute infarct, hemorrhage or mass. There are benign dural calcifications scattered along the falx. No midline shift is seen. Basal cisterns are clear. Vascular: Scattered calcification both siphons. No hyperdense central vessels. Skull: The calvarium, skull base and orbits are intact. No skull lesion is seen. Sinuses/Orbits: There are retention cysts in the maxillary sinuses. Visualized sinuses are otherwise clear. There is right-sided deviation and spurring of the nasal septum. There is no mastoid effusion. Chronic bilateral vitreous hemorrhage is again noted with right phthisis bulbi. Other: None. IMPRESSION: 1. No acute intracranial CT findings or interval changes. 2. Atrophy and age-advanced small-vessel disease. 3. 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 Electronically Signed   By: Telford Nab M.D.   On: 06/12/2022 21:23    Labs: BNP (last 3 results) Recent Labs    11/05/21 2306  BNP 333.5*   Basic Metabolic Panel: Recent Labs  Lab 06/12/22 2108 06/12/22 2121 06/13/22 0456 06/13/22 2022  NA 133*  133* 135 135  --   K 5.7*  5.7* 5.9* 5.4*  --   CL 97* 93* 94*  --   CO2  --  22 21*  --   GLUCOSE 117* 119* 90  --   BUN 79* 83* 87*  --   CREATININE 11.50* 10.31* 10.78*  --   CALCIUM  --  8.8* 8.8*  --   PHOS  --   --   --  7.3*   Liver Function Tests: Recent Labs  Lab 06/12/22 2121  AST 39  ALT 35  ALKPHOS 99  BILITOT 1.1  PROT 7.3  ALBUMIN 3.5   No results for input(s): "LIPASE", "AMYLASE" in the last 168 hours. Recent Labs  Lab 06/12/22 2125  AMMONIA 29   CBC: Recent Labs  Lab 06/12/22 2108 06/12/22 2125 06/13/22 0456  WBC  --  10.5 8.3  NEUTROABS  --  7.8*  --   HGB 11.2*  10.5* 9.7* 9.8*  HCT 33.0*  31.0* 30.3* 30.7*  MCV  --  95.3 93.6  PLT  --  144* 147*   Cardiac Enzymes: No results for input(s): "CKTOTAL", "CKMB", "CKMBINDEX", "TROPONINI" in the last 168  hours. BNP: Invalid input(s): "POCBNP" CBG: Recent Labs  Lab  06/13/22 0419  GLUCAP 85   D-Dimer No results for input(s): "DDIMER" in the last 72 hours. Hgb A1c No results for input(s): "HGBA1C" in the last 72 hours. Lipid Profile No results for input(s): "CHOL", "HDL", "LDLCALC", "TRIG", "CHOLHDL", "LDLDIRECT" in the last 72 hours. Thyroid function studies No results for input(s): "TSH", "T4TOTAL", "T3FREE", "THYROIDAB" in the last 72 hours.  Invalid input(s): "FREET3" Anemia work up No results for input(s): "VITAMINB12", "FOLATE", "FERRITIN", "TIBC", "IRON", "RETICCTPCT" in the last 72 hours. Urinalysis    Component Value Date/Time   COLORURINE YELLOW 05/07/2022 1826   APPEARANCEUR CLEAR 05/07/2022 1826   LABSPEC 1.012 05/07/2022 1826   PHURINE 8.0 05/07/2022 1826   GLUCOSEU 50 (A) 05/07/2022 1826   HGBUR NEGATIVE 05/07/2022 1826   BILIRUBINUR NEGATIVE 05/07/2022 1826   KETONESUR NEGATIVE 05/07/2022 1826   PROTEINUR >=300 (A) 05/07/2022 1826   UROBILINOGEN 0.2 12/13/2014 2056   NITRITE NEGATIVE 05/07/2022 1826   LEUKOCYTESUR NEGATIVE 05/07/2022 1826   Sepsis Labs Recent Labs  Lab 06/12/22 2125 06/13/22 0456  WBC 10.5 8.3   Microbiology Recent Results (from the past 240 hour(s))  Resp panel by RT-PCR (RSV, Flu A&B, Covid) Anterior Nasal Swab     Status: Abnormal   Collection Time: 06/12/22 10:45 PM   Specimen: Anterior Nasal Swab  Result Value Ref Range Status   SARS Coronavirus 2 by RT PCR NEGATIVE NEGATIVE Final    Comment: (NOTE) SARS-CoV-2 target nucleic acids are NOT DETECTED.  The SARS-CoV-2 RNA is generally detectable in upper respiratory specimens during the acute phase of infection. The lowest concentration of SARS-CoV-2 viral copies this assay can detect is 138 copies/mL. A negative result does not preclude SARS-Cov-2 infection and should not be used as the sole basis for treatment or other patient management decisions. A negative result may occur  with  improper specimen collection/handling, submission of specimen other than nasopharyngeal swab, presence of viral mutation(s) within the areas targeted by this assay, and inadequate number of viral copies(<138 copies/mL). A negative result must be combined with clinical observations, patient history, and epidemiological information. The expected result is Negative.  Fact Sheet for Patients:  EntrepreneurPulse.com.au  Fact Sheet for Healthcare Providers:  IncredibleEmployment.be  This test is no t yet approved or cleared by the [place] FDA and  has been authorized for detection and/or diagnosis of SARS-CoV-2 by FDA under an Emergency Use Authorization (EUA). This EUA will remain  in effect (meaning this test can be used) for the duration of the COVID-19 declaration under Section 564(b)(1) of the Act, 21 U.S.C.section 360bbb-3(b)(1), unless the authorization is terminated  or revoked sooner.       Influenza A by PCR POSITIVE (A) NEGATIVE Final   Influenza B by PCR NEGATIVE NEGATIVE Final    Comment: (NOTE) The Xpert Xpress SARS-CoV-2/FLU/RSV plus assay is intended as an aid in the diagnosis of influenza from Nasopharyngeal swab specimens and should not be used as a sole basis for treatment. Nasal washings and aspirates are unacceptable for Xpert Xpress SARS-CoV-2/FLU/RSV testing.  Fact Sheet for Patients: EntrepreneurPulse.com.au  Fact Sheet for Healthcare Providers: IncredibleEmployment.be  This test is not yet approved or cleared by the [place] FDA and has been authorized for detection and/or diagnosis of SARS-CoV-2 by FDA under an Emergency Use Authorization (EUA). This EUA will remain in effect (meaning this test can be used) for the duration of the COVID-19 declaration under Section 564(b)(1) of the Act, 21 U.S.C. section 360bbb-3(b)(1), unless the authorization is terminated  or  revoked.     Resp Syncytial Virus by PCR NEGATIVE NEGATIVE Final    Comment: (NOTE) Fact Sheet for Patients: EntrepreneurPulse.com.au  Fact Sheet for Healthcare Providers: IncredibleEmployment.be  This test is not yet approved or cleared by the [place] FDA and has been authorized for detection and/or diagnosis of SARS-CoV-2 by FDA under an Emergency Use Authorization (EUA). This EUA will remain in effect (meaning this test can be used) for the duration of the COVID-19 declaration under Section 564(b)(1) of the Act, 21 U.S.C. section 360bbb-3(b)(1), unless the authorization is terminated or revoked.  Performed at [facility], [place] [address], [place], [place] [address]   Time coordinating discharge: 25 minutes SIGNED: Antonieta Pert, MD  [facility] 06/15/2022, 11:44 AM If 7PM-7AM, please contact night-coverage [email or web address]

## 2022-06-18 NOTE — Progress Notes (Signed)
   SUBJECTIVE Patient with a history of diabetes mellitus presents to office today complaining of elongated, thickened nails that cause pain while ambulating in shoes.  Patient is unable to trim their own nails. Patient is here for further evaluation and treatment.   Past Medical History:  Diagnosis Date   Acute on chronic diastolic CHF (congestive heart failure) ([place]) 09/27/2019   Acute respiratory failure with hypoxia ([place]) 03/26/2021   Acute urinary retention 10/13/2019   Anemia    Blindness 09/04/2019   CHF (congestive heart failure) ([place])    COVID-19    Diabetes mellitus    Diarrhea 10/14/2019   Epigastric abdominal pain    ESRD (end stage renal disease) ([place])    Gastroparesis due to DM ([place])    Hypertension    Hypertensive urgency 04/17/2019   Hypervolemia    Hypoalbuminemia 04/17/2019   Hypoxia    Metabolic acidosis, increased anion gap 10/13/2019   Multifocal pneumonia 05/06/2021   Neuropathy of lower extremity    Oral candidiasis 06/26/2012   Palliative care encounter    Pancreatitis 06/26/2012   Weight loss 06/26/2012    OBJECTIVE General Patient is awake, alert, and oriented x 3 and in no acute distress. Derm Skin is dry and supple bilateral. Negative open lesions or macerations. Remaining integument unremarkable. Nails are tender, long, thickened and dystrophic with subungual debris, consistent with onychomycosis, 1-5 bilateral. No signs of infection noted. Vasc  DP and PT pedal pulses palpable bilaterally. Temperature gradient within normal limits.  Neuro Epicritic and protective threshold sensation diminished bilaterally.  Musculoskeletal Exam No symptomatic pedal deformities noted bilateral. Muscular strength within normal limits.  ASSESSMENT 1. Diabetes Mellitus w/ peripheral neuropathy 2.  Pain due to onychomycosis of toenails bilateral  PLAN OF CARE 1. Patient evaluated today. 2. Instructed to maintain good pedal hygiene and foot care. Stressed importance  of controlling blood sugar.  3. Mechanical debridement of nails 1-5 bilaterally performed using a nail nipper. Filed with dremel without incident.  4. Return to clinic in 3 mos.

## 2022-06-25 ENCOUNTER — Non-Acute Institutional Stay: Payer: Self-pay | Admitting: Hospice

## 2022-06-25 DIAGNOSIS — G4700 Insomnia, unspecified: Secondary | ICD-10-CM

## 2022-06-25 DIAGNOSIS — I5032 Chronic diastolic (congestive) heart failure: Secondary | ICD-10-CM

## 2022-06-25 DIAGNOSIS — N186 End stage renal disease: Secondary | ICD-10-CM

## 2022-06-25 DIAGNOSIS — R0602 Shortness of breath: Secondary | ICD-10-CM

## 2022-06-25 DIAGNOSIS — R531 Weakness: Secondary | ICD-10-CM

## 2022-06-25 NOTE — Progress Notes (Signed)
[facility] Consult Note Telephone: [phone]  Fax: [phone]  PATIENT NAME: Christopher Davila [place] [address] [phone] (home)  DOB: 1970/09/18 MRN: [id number]  PRIMARY CARE PROVIDER:    [name], MD,  [place] [address] [phone]  REFERRING PROVIDER:   [name], MD [address] [place],  [place] [address] [phone]  RESPONSIBLE PARTY:   Self Emergency contact - cousin - [place]     Name Relation Home Work Mobile   [place] Relative   [phone]   [place], [place] Relative [phone]          I met face to face with patient at facility. Palliative Care was asked to follow this patient by consultation request of  [name], MD to address advance care planning, complex medical decision making and goals of care clarification.  Initial consult/posthospitalization. ASSESSMENT AND / RECOMMENDATIONS:  Advance Care Planning: Our advance care planning conversation included a discussion about:    The value and importance of advance care planning  Difference between Hospice and Palliative care Exploration of goals of care in the event of a sudden injury or illness  Identification and preparation of a healthcare agent  Review and updating or creation of an  advance directive document . Decision not to resuscitate or to de-escalate disease focused treatments due to poor prognosis.  CODE STATUS: Discussion on implications of CODE STATUS.  Patient affirmed he is a full code.  Goals of Care: Goals include to maximize quality of life and symptom management  I spent 16 minutes providing this initial consultation. More than 50% of the time in this consultation was spent on counseling patient and coordinating  communication. --------------------------------------------------------------------------------------------------------------------------------------  Symptom Management/Plan: Weakness: Acute on chronic s/p recent hospitalization for influenza A.  Patient was treated in the hospital, encephalopathy resolved, discharged to SNF . PT OT for strengthening and ambulation.  Balance of rest and performance activity.  Fall precautions Shortness of breath: Worsened with recent infection with influenza a follow-up which patient was hospitalized.  Also related to recent chronic congestive heart failure.  Continue oxygen supplementation as ordered.  Albuterol is on hand prn.  ESRD: Ongoing dialysis Mon Wed Friday, well tolerated CHF: Continue on Torsemide and metolazone as ordered. Report weight gain of 3 Ibs in a day or 5 pounds in a week. Fluid restriction 1500cc, no added salt.  CBC CMP.  Follow up: Palliative care will continue to follow for complex medical decision making, advance care planning, and clarification of goals. Return 6 weeks or prn.Encouraged to call provider sooner with any concerns.   Family /Caregiver/Community Supports: Patient in SNF for ongoing care  HOSPICE ELIGIBILITY/DIAGNOSIS: TBD  Chief Complaint: Follow-up visit  HISTORY OF PRESENT ILLNESS:  Christopher Davila is a 52 y.o. year old male  with multiple morbidities requiring close monitoring/management, and with high risk of complications and mortality: end-stage renal disease on dialysis, CHF with preserved ejection fraction, chronic respiratory failure with hypoxia, hypertension, low left shoulder osteoarthritis, depression.  Recent hospitalization for influenza. History obtained from review of EMR, discussion with primary team, caregiver, family and/or Mr. Prieur.  Review and summarization of Epic records shows history from other than patient. Rest of 10 point ROS asked and negative.  I reviewed, as needed, available labs, patient  records, imaging, studies and related documents from the EMR.  PAST MEDICAL HISTORY:  Active Ambulatory Problems    Diagnosis Date Noted  Type 2 diabetes mellitus with hypoglycemia ([place]) 06/26/2012   Essential hypertension    Hyperlipidemia    Gastroparesis due to DM [facility])    Alcohol use    Acute loss of vision, right 06/16/2019   Homeless 09/19/2019   Anemia of chronic disease 09/19/2019   Vision loss, left eye 09/19/2019   Vision loss of left eye 09/21/2019   Weakness 10/05/2019   ESRD on dialysis [facility])    Heart failure with preserved ejection fraction ([place])    Goals of care, counseling/discussion    Pressure injury of skin 11/06/2019   History of thoracentesis    End-stage renal disease on hemodialysis (HCC)    Pain due to onychomycosis of toenails of both feet 07/31/2021   Skin lesion 07/31/2021   Generalized weakness 11/05/2021   Resolved Ambulatory Problems    Diagnosis Date Noted   Hypertension 06/26/2012   Pancreatitis 06/26/2012   Weight loss 06/26/2012   Oral candidiasis 06/26/2012   Abdominal pain 12/28/2012   Chest pain 02/14/2014   Nausea    Dyspnea    Heart failure ([place]) 02/13/2019   Epigastric abdominal pain    Hypoxia    Wheezing    CKD (chronic kidney disease) stage 4, GFR 15-29 ml/min (HCC)    Acute kidney injury superimposed on chronic kidney disease (HCC)    Acute renal failure superimposed on stage 3 chronic kidney disease ([place]) 04/17/2019   Hypertensive urgency 04/17/2019   Hypoalbuminemia 04/17/2019   Acute kidney injury superimposed on chronic kidney disease ([place] [facility]) 09/19/2019   CHF (congestive heart failure) ([place]) 09/26/2019   Acute on chronic diastolic CHF (congestive heart failure) ([place]) 23/55/7322   Metabolic acidosis, increased anion gap 10/13/2019   Acute urinary retention 10/13/2019   Palliative care encounter    Acute respiratory failure with hypoxia ([place] [facility] [facility]) 03/26/2021   COVID-19    Hypervolemia    Multifocal pneumonia 05/06/2021    Past Medical History:  Diagnosis Date   Anemia    Blindness 09/04/2019   Diabetes mellitus    Diarrhea 10/14/2019   ESRD (end stage renal disease) ([place])    Neuropathy of lower extremity     SOCIAL HX:  Social History   Tobacco Use   Smoking status: Former    Packs/day: 0.00    Types: Cigarettes    Quit date: 03/22/2015    Years since quitting: 6.6   Smokeless tobacco: Never  Substance Use Topics   Alcohol use: Not Currently     FAMILY HX:  Family History  Problem Relation Age of Onset   Diabetes Mother    Lung disease Mother    Hypertension Mother    Diabetes Maternal Aunt    CAD Maternal Aunt    CAD Cousin       ALLERGIES:  Allergies  Allergen Reactions   Morphine And Related Itching and Other (See Comments)    Pt prefers not to be given this drug      PERTINENT MEDICATIONS:  Outpatient Encounter Medications as of 11/27/2021  Medication Sig   albuterol (VENTOLIN HFA) 108 (90 Base) MCG/ACT inhaler Inhale 2 puffs into the lungs every 4 (four) hours as needed for shortness of breath.   amLODipine (NORVASC) 10 MG tablet Take 0.5 tablets (5 mg total) by mouth daily. (Patient not taking: Reported on 11/06/2021)   aspirin EC 81 MG tablet Take 1 tablet (81 mg total) by mouth daily.   atorvastatin (LIPITOR) 40 MG tablet Take 1 tablet (40 mg total) by mouth daily.  Atropine Sulfate 0.01 % SOLN Place 1 drop into both eyes in the morning and at bedtime.   brimonidine (ALPHAGAN P) 0.1 % SOLN Place 1 drop into the left eye in the morning and at bedtime.   calcium acetate (PHOSLO) 667 MG capsule Take 1 capsule (667 mg total) by mouth 3 (three) times daily with meals. (Patient taking differently: Take 1,334 mg by mouth 3 (three) times daily with meals.)   capsaicin (ZOSTRIX) 0.025 % cream Apply 1 application. topically every 8 (eight) hours as needed (pain).   Dextran 70-Hypromellose (TEARS PURE) 0.1-0.3 % SOLN Place 1 drop into both eyes in the morning, at noon, in the  evening, and at bedtime.   dorzolamide-timolol (COSOPT) 22.3-6.8 MG/ML ophthalmic solution Place 1 drop into the left eye 2 (two) times daily.   ferrous sulfate 325 (65 FE) MG tablet Take 325 mg by mouth daily. (Patient not taking: Reported on 11/06/2021)   guaiFENesin (ROBITUSSIN) 100 MG/5ML liquid Take 100 mg by mouth every 4 (four) hours as needed for cough.   hydrALAZINE (APRESOLINE) 25 MG tablet Take 37.5 mg by mouth 3 (three) times daily.   ipratropium-albuterol (DUONEB) 0.5-2.5 (3) MG/3ML SOLN Take 3 mLs by nebulization every 6 (six) hours as needed (shortness of breath or wheezing).   latanoprost (XALATAN) 0.005 % ophthalmic solution Place 1 drop into the left eye at bedtime.   losartan (COZAAR) 100 MG tablet Take 1 tablet (100 mg total) by mouth daily.   Melatonin 10 MG CAPS Take 10 mg by mouth at bedtime.   Menthol, Topical Analgesic, 5 % GEL Apply 1 application. topically every 8 (eight) hours as needed (hand/wrist pain).   Nutritional Supplements (RESOURCE 2.0 PO) Take 120 mLs by mouth in the morning and at bedtime.   olopatadine (PATANOL) 0.1 % ophthalmic solution Place 2 drops into both eyes daily.   ondansetron (ZOFRAN) 4 MG tablet Take 4 mg by mouth every 6 (six) hours as needed for nausea or vomiting.   oxyCODONE-acetaminophen (PERCOCET/ROXICET) 5-325 MG tablet Take 1 tablet by mouth See admin instructions. 1 tablet once daily every Tuesday, Thursday, Saturday, and Sunday. May take an additional 1 tablet every 6 hours as needed on Monday's Wednesday's, and Friday's.   OXYGEN Inhale 3 L into the lungs continuous.   polyethylene glycol (MIRALAX / GLYCOLAX) 17 g packet Take 17 g by mouth See admin instructions. Every three days.  May take an additional 17g daily as needed for constipation.   pramoxine-zinc acetate (CALADRYL) 1-0.1 % LOTN Apply 1 application. topically every 8 (eight) hours as needed (back, torso, arms, and legs for puritus).   prednisoLONE acetate (PRED FORTE) 1 %  ophthalmic suspension Place 1 drop into both eyes 4 (four) times daily.   pregabalin (LYRICA) 25 MG capsule Take 1 capsule (25 mg total) by mouth daily.   senna-docusate (SENOKOT-S) 8.6-50 MG tablet Take 2 tablets by mouth 2 (two) times daily.   torsemide (DEMADEX) 100 MG tablet Take 100 mg by mouth 2 (two) times daily.   vitamin B-12 1000 MCG tablet Take 1 tablet (1,000 mcg total) by mouth daily.   Vitamin D, Ergocalciferol, (DRISDOL) 1.25 MG (50000 UNIT) CAPS capsule Take 50,000 Units by mouth every Sunday.   No facility-administered encounter medications on file as of 11/27/2021.    Thank you for the opportunity to participate in the care of Mr. Purohit.  The palliative care team will continue to follow. Please call our office at [phone] if we can be of additional  assistance.   Note: Portions of this note were generated with [employer]. Dictation errors may occur despite best attempts at proofreading.  Teodoro Spray, NP

## 2022-09-06 ENCOUNTER — Ambulatory Visit: Payer: Medicare Other | Admitting: Podiatry

## 2022-09-10 ENCOUNTER — Ambulatory Visit: Payer: Medicare Other | Admitting: Podiatry

## 2022-10-01 ENCOUNTER — Ambulatory Visit (INDEPENDENT_AMBULATORY_CARE_PROVIDER_SITE_OTHER): Payer: Medicare Other | Admitting: Podiatry

## 2022-10-01 ENCOUNTER — Encounter: Payer: Self-pay | Admitting: Podiatry

## 2022-10-01 DIAGNOSIS — B351 Tinea unguium: Secondary | ICD-10-CM

## 2022-10-01 DIAGNOSIS — M79675 Pain in left toe(s): Secondary | ICD-10-CM

## 2022-10-01 DIAGNOSIS — M79674 Pain in right toe(s): Secondary | ICD-10-CM | POA: Diagnosis not present

## 2022-10-01 DIAGNOSIS — E11649 Type 2 diabetes mellitus with hypoglycemia without coma: Secondary | ICD-10-CM | POA: Diagnosis not present

## 2022-10-01 NOTE — Progress Notes (Signed)
  Subjective:  Patient ID: Christopher Davila, male    DOB: Oct 24, 1970,   MRN: [id number]  Chief Complaint  Patient presents with   Diabetes    Diabetic foot care    52 y.o. male presents for concern of thickened elongated and painful nails that are difficult to trim. Requesting to have them trimmed today. Relates burning and tingling in their feet. Patient is diabetic and last A1c was  Lab Results  Component Value Date   HGBA1C 4.6 (L) 03/26/2021   .   PCP:  Karna Dupes, MD    . Denies any other pedal complaints. Denies n/v/f/c.   Past Medical History:  Diagnosis Date   Acute on chronic diastolic CHF (congestive heart failure) 09/27/2019   Acute respiratory failure with hypoxia 03/26/2021   Acute urinary retention 10/13/2019   Anemia    Blindness 09/04/2019   CHF (congestive heart failure)    COVID-19    Diabetes mellitus    Diarrhea 10/14/2019   Epigastric abdominal pain    ESRD (end stage renal disease)    Gastroparesis due to DM    Hypertension    Hypertensive urgency 04/17/2019   Hypervolemia    Hypoalbuminemia 04/17/2019   Hypoxia    Metabolic acidosis, increased anion gap 10/13/2019   Multifocal pneumonia 05/06/2021   Neuropathy of lower extremity    Oral candidiasis 06/26/2012   Palliative care encounter    Pancreatitis 06/26/2012   Weight loss 06/26/2012    Objective:  Physical Exam: Vascular: DP/PT pulses 2/4 bilateral. CFT <3 seconds. Absent hair growth on digits. Edema noted to bilateral lower extremities. Xerosis noted bilaterally.  Skin. No lacerations or abrasions bilateral feet. Nails 1-5 bilateral  are thickened discolored and elongated with subungual debris.  Musculoskeletal: MMT 5/5 bilateral lower extremities in DF, PF, Inversion and Eversion. Deceased ROM in DF of ankle joint.  Neurological: Sensation intact to light touch. Protective sensation diminished bilateral.    Assessment:   1. Pain due to onychomycosis of toenails of both feet   2.  Type 2 diabetes mellitus with hypoglycemia without coma, without long-term current use of insulin      Plan:  Patient was evaluated and treated and all questions answered. -Discussed and educated patient on diabetic foot care, especially with  regards to the vascular, neurological and musculoskeletal systems.  -Stressed the importance of good glycemic control and the detriment of not  controlling glucose levels in relation to the foot. -Discussed supportive shoes at all times and checking feet regularly.  -Mechanically debrided all nails 1-5 bilateral using sterile nail nipper and filed with dremel without incident  -Answered all patient questions -Patient to return  in 3 months for at risk foot care -Patient advised to call the office if any problems or questions arise in the meantime.   Louann Sjogren, DPM

## 2022-12-25 ENCOUNTER — Emergency Department (HOSPITAL_COMMUNITY)

## 2022-12-25 ENCOUNTER — Observation Stay (HOSPITAL_COMMUNITY)
Admission: EM | Admit: 2022-12-25 | Discharge: 2022-12-26 | Disposition: A | Discharge status: Skilled Nursing Facility | Attending: Family Medicine | Admitting: Family Medicine

## 2022-12-25 ENCOUNTER — Observation Stay (HOSPITAL_COMMUNITY)

## 2022-12-25 DIAGNOSIS — E1122 Type 2 diabetes mellitus with diabetic chronic kidney disease: Secondary | ICD-10-CM | POA: Insufficient documentation

## 2022-12-25 DIAGNOSIS — G9340 Encephalopathy, unspecified: Secondary | ICD-10-CM | POA: Diagnosis present

## 2022-12-25 DIAGNOSIS — Z992 Dependence on renal dialysis: Secondary | ICD-10-CM | POA: Diagnosis not present

## 2022-12-25 DIAGNOSIS — I5031 Acute diastolic (congestive) heart failure: Secondary | ICD-10-CM | POA: Insufficient documentation

## 2022-12-25 DIAGNOSIS — J9611 Chronic respiratory failure with hypoxia: Secondary | ICD-10-CM | POA: Diagnosis not present

## 2022-12-25 DIAGNOSIS — H9201 Otalgia, right ear: Secondary | ICD-10-CM | POA: Diagnosis not present

## 2022-12-25 DIAGNOSIS — J189 Pneumonia, unspecified organism: Secondary | ICD-10-CM | POA: Diagnosis not present

## 2022-12-25 DIAGNOSIS — R262 Difficulty in walking, not elsewhere classified: Secondary | ICD-10-CM | POA: Diagnosis not present

## 2022-12-25 DIAGNOSIS — B9562 Methicillin resistant Staphylococcus aureus infection as the cause of diseases classified elsewhere: Secondary | ICD-10-CM | POA: Diagnosis not present

## 2022-12-25 DIAGNOSIS — Z1152 Encounter for screening for COVID-19: Secondary | ICD-10-CM | POA: Insufficient documentation

## 2022-12-25 DIAGNOSIS — R2681 Unsteadiness on feet: Secondary | ICD-10-CM | POA: Insufficient documentation

## 2022-12-25 DIAGNOSIS — N186 End stage renal disease: Secondary | ICD-10-CM | POA: Diagnosis not present

## 2022-12-25 DIAGNOSIS — E11649 Type 2 diabetes mellitus with hypoglycemia without coma: Secondary | ICD-10-CM | POA: Diagnosis not present

## 2022-12-25 DIAGNOSIS — Z87891 Personal history of nicotine dependence: Secondary | ICD-10-CM | POA: Insufficient documentation

## 2022-12-25 DIAGNOSIS — Z79899 Other long term (current) drug therapy: Secondary | ICD-10-CM | POA: Insufficient documentation

## 2022-12-25 DIAGNOSIS — I5032 Chronic diastolic (congestive) heart failure: Secondary | ICD-10-CM | POA: Insufficient documentation

## 2022-12-25 DIAGNOSIS — J90 Pleural effusion, not elsewhere classified: Secondary | ICD-10-CM | POA: Diagnosis present

## 2022-12-25 DIAGNOSIS — R531 Weakness: Secondary | ICD-10-CM

## 2022-12-25 DIAGNOSIS — I132 Hypertensive heart and chronic kidney disease with heart failure and with stage 5 chronic kidney disease, or end stage renal disease: Secondary | ICD-10-CM | POA: Insufficient documentation

## 2022-12-25 DIAGNOSIS — M6281 Muscle weakness (generalized): Secondary | ICD-10-CM | POA: Diagnosis not present

## 2022-12-25 DIAGNOSIS — Z9889 Other specified postprocedural states: Secondary | ICD-10-CM

## 2022-12-25 DIAGNOSIS — Z22322 Carrier or suspected carrier of Methicillin resistant Staphylococcus aureus: Secondary | ICD-10-CM

## 2022-12-25 LAB — CBC WITH DIFFERENTIAL/PLATELET
Abs Immature Granulocytes: 0.05 10*3/uL (ref 0.00–0.07)
Basophils Absolute: 0 10*3/uL (ref 0.0–0.1)
Basophils Relative: 0 %
Eosinophils Absolute: 0 10*3/uL (ref 0.0–0.5)
Eosinophils Relative: 0 %
HCT: 31.7 % — ABNORMAL LOW (ref 39.0–52.0)
Hemoglobin: 10.4 g/dL — ABNORMAL LOW (ref 13.0–17.0)
Immature Granulocytes: 0 %
Lymphocytes Relative: 4 %
Lymphs Abs: 0.6 10*3/uL — ABNORMAL LOW (ref 0.7–4.0)
MCH: 29.9 pg (ref 26.0–34.0)
MCHC: 32.8 g/dL (ref 30.0–36.0)
MCV: 91.1 fL (ref 80.0–100.0)
Monocytes Absolute: 1.2 10*3/uL — ABNORMAL HIGH (ref 0.1–1.0)
Monocytes Relative: 8 %
Neutro Abs: 12.3 10*3/uL — ABNORMAL HIGH (ref 1.7–7.7)
Neutrophils Relative %: 88 %
Platelets: 203 10*3/uL (ref 150–400)
RBC: 3.48 MIL/uL — ABNORMAL LOW (ref 4.22–5.81)
RDW: 17.2 % — ABNORMAL HIGH (ref 11.5–15.5)
WBC: 14.2 10*3/uL — ABNORMAL HIGH (ref 4.0–10.5)
nRBC: 0 % (ref 0.0–0.2)

## 2022-12-25 LAB — LACTIC ACID, PLASMA
Lactic Acid, Venous: 1.3 mmol/L (ref 0.5–1.9)
Lactic Acid, Venous: 1.1 mmol/L (ref 0.5–1.9)

## 2022-12-25 LAB — I-STAT VENOUS BLOOD GAS, ED
Acid-Base Excess: 4 mmol/L — ABNORMAL HIGH (ref 0.0–2.0)
Bicarbonate: 27.5 mmol/L (ref 20.0–28.0)
Calcium, Ion: 1.02 mmol/L — ABNORMAL LOW (ref 1.15–1.40)
HCT: 34 % — ABNORMAL LOW (ref 39.0–52.0)
Hemoglobin: 11.6 g/dL — ABNORMAL LOW (ref 13.0–17.0)
O2 Saturation: 99 %
Potassium: 3.6 mmol/L (ref 3.5–5.1)
Sodium: 137 mmol/L (ref 135–145)
TCO2: 29 mmol/L (ref 22–32)
pCO2, Ven: 35 mmHg — ABNORMAL LOW (ref 44–60)
pH, Ven: 7.504 — ABNORMAL HIGH (ref 7.25–7.43)
pO2, Ven: 108 mmHg — ABNORMAL HIGH (ref 32–45)

## 2022-12-25 LAB — COMPREHENSIVE METABOLIC PANEL
ALT: 26 U/L (ref 0–44)
AST: 19 U/L (ref 15–41)
Albumin: 3.5 g/dL (ref 3.5–5.0)
Alkaline Phosphatase: 127 U/L — ABNORMAL HIGH (ref 38–126)
Anion gap: 19 — ABNORMAL HIGH (ref 5–15)
BUN: 37 mg/dL — ABNORMAL HIGH (ref 6–20)
CO2: 26 mmol/L (ref 22–32)
Calcium: 9.4 mg/dL (ref 8.9–10.3)
Chloride: 92 mmol/L — ABNORMAL LOW (ref 98–111)
Creatinine, Ser: 5.02 mg/dL — ABNORMAL HIGH (ref 0.61–1.24)
GFR, Estimated: 13 mL/min — ABNORMAL LOW (ref >60)
Glucose, Bld: 82 mg/dL (ref 70–99)
Potassium: 3.6 mmol/L (ref 3.5–5.1)
Sodium: 137 mmol/L (ref 135–145)
Total Bilirubin: 1.1 mg/dL (ref 0.3–1.2)
Total Protein: 8.6 g/dL — ABNORMAL HIGH (ref 6.5–8.1)

## 2022-12-25 MED ORDER — ACETAMINOPHEN 500 MG PO TABS: 500.0000 mg | ORAL_TABLET | Freq: Four times a day (QID) | ORAL | Status: DC | PRN

## 2022-12-25 MED ORDER — PIPERACILLIN-TAZOBACTAM IN DEX 2-0.25 GM/50ML IV SOLN
2.2500 g | Freq: Three times a day (TID) | INTRAVENOUS | Status: DC
  Filled 2022-12-25: qty 50

## 2022-12-25 MED ORDER — LATANOPROST 0.005 % OP SOLN
1.0000 [drp] | Freq: Every day | OPHTHALMIC | Status: DC
  Filled 2022-12-25: qty 2.5

## 2022-12-25 MED ORDER — SENNOSIDES-DOCUSATE SODIUM 8.6-50 MG PO TABS: 2.0000 | ORAL_TABLET | Freq: Every day | ORAL | Status: DC | PRN

## 2022-12-25 MED ORDER — VANCOMYCIN VARIABLE DOSE PER UNSTABLE RENAL FUNCTION (PHARMACIST DOSING): Status: DC

## 2022-12-25 MED ORDER — OXYCODONE-ACETAMINOPHEN 5-325 MG PO TABS
1.0000 | ORAL_TABLET | Freq: Four times a day (QID) | ORAL | Status: DC | PRN
  Administered 2022-12-26 (×2): 1 via ORAL
  Filled 2022-12-25 (×2): qty 1

## 2022-12-25 MED ORDER — IOHEXOL 350 MG/ML SOLN
75.0000 mL | Freq: Once | INTRAVENOUS | Status: AC | PRN
  Administered 2022-12-25: 75 mL via INTRAVENOUS

## 2022-12-25 MED ORDER — VANCOMYCIN HCL 1250 MG/250ML IV SOLN
1250.0000 mg | Freq: Once | INTRAVENOUS | Status: AC
  Administered 2022-12-25: 1250 mg via INTRAVENOUS
  Filled 2022-12-25: qty 250

## 2022-12-25 MED ORDER — ASPIRIN 81 MG PO TBEC
81.0000 mg | DELAYED_RELEASE_TABLET | Freq: Every day | ORAL | Status: DC
  Administered 2022-12-26: 81 mg via ORAL
  Filled 2022-12-25: qty 1

## 2022-12-25 MED ORDER — HEPARIN SODIUM (PORCINE) 5000 UNIT/ML IJ SOLN
5000.0000 [IU] | Freq: Three times a day (TID) | INTRAMUSCULAR | Status: DC
  Administered 2022-12-26 (×2): 5000 [IU] via SUBCUTANEOUS
  Filled 2022-12-25 (×2): qty 1

## 2022-12-25 MED ORDER — ATORVASTATIN CALCIUM 40 MG PO TABS
40.0000 mg | ORAL_TABLET | Freq: Every day | ORAL | Status: DC
  Administered 2022-12-26: 40 mg via ORAL
  Filled 2022-12-25: qty 1

## 2022-12-25 MED ORDER — CALCIUM ACETATE (PHOS BINDER) 667 MG PO CAPS
667.0000 mg | ORAL_CAPSULE | Freq: Three times a day (TID) | ORAL | Status: DC
  Administered 2022-12-26 (×2): 667 mg via ORAL
  Filled 2022-12-25 (×2): qty 1

## 2022-12-25 MED ORDER — ATROPINE SULFATE 1 % OP SOLN: 1.0000 [drp] | Freq: Every evening | OPHTHALMIC | Status: DC | PRN

## 2022-12-25 MED ORDER — SODIUM CHLORIDE 0.9 % IV BOLUS
250.0000 mL | Freq: Once | INTRAVENOUS | Status: AC
  Administered 2022-12-25: 250 mL via INTRAVENOUS

## 2022-12-25 MED ORDER — PIPERACILLIN-TAZOBACTAM 3.375 G IVPB
3.3750 g | Freq: Two times a day (BID) | INTRAVENOUS | Status: DC
  Administered 2022-12-25: 3.375 g via INTRAVENOUS
  Filled 2022-12-25: qty 50

## 2022-12-25 NOTE — Assessment & Plan Note (Signed)
Completed HD today. Does not appear hypervolemic, or have electrolyte derangements, or any other urgent need for repeat dialysis session tonight. -Consult to nephrology if patient remains inpatient with HD needs 

## 2022-12-25 NOTE — Assessment & Plan Note (Signed)
Initially met SIRS criteria with tachypnea, tachycardia, febrile to 101 Fahrenheit.  Visualized on chest x-ray and CT abdomen/pelvis. History of recurrent pneumonia requiring hospitalization, not within the past 30 days. -Antibiotics as above (Zosyn, vancomycin), does have risk factors for MRSA given resident of living facility -MRSA swab, de-escalate antibiotics as appropriate

## 2022-12-25 NOTE — ED Provider Notes (Signed)
EMERGENCY DEPARTMENT AT [facility] Provider Note   CSN: [id number] Arrival date & time: 12/25/22  1805     History  Chief Complaint  Patient presents with   generalized weakness    Kaicen Desena is a 52 y.o. male.  Patient is a 52 year old patient with a past medical history complicated by ESRD on dialysis MWF, CHF, chronic respiratory failure on 2L oxygen, blindness, resides at a nursing facility, presenting to the ED with weakness. Patient reports he felt weak and ill since waking up this morning, as well as a cough. He did receive dialysis today. He also reports right ear pain. Patient is somnolent and does not provide thorough history.        Home Medications Prior to Admission medications   Medication Sig Start Date End Date Taking? Authorizing Provider  acetaminophen (TYLENOL) 650 MG suppository Place 650 mg rectally every 6 (six) hours as needed for mild pain or moderate pain.    [provider]  albuterol (VENTOLIN HFA) 108 (90 Base) MCG/ACT inhaler Inhale 2 puffs into the lungs every 4 (four) hours as needed for shortness of breath.    [provider]  amLODipine (NORVASC) 5 MG tablet Take 5-10 mg by mouth See admin instructions. Take 10mg  by mouth daily every Tuesday, Thursday, Saturday and Sunday. Take 5mg  by mouth every Monday, Wednesday, and Friday. 05/10/22   [provider]  aspirin EC 81 MG tablet Take 1 tablet (81 mg total) by mouth daily. 05/24/15   Ghimire, Werner Lean, MD  atorvastatin (LIPITOR) 40 MG tablet Take 1 tablet (40 mg total) by mouth daily. 12/05/20   [place], Peter M, MD  Atropine Sulfate 0.01 % SOLN Place 1 drop into both eyes in the morning and at bedtime. 04/24/21   [provider]  brimonidine (ALPHAGAN P) 0.1 % SOLN Place 1 drop into the left eye in the morning and at bedtime.    [provider]  calcium acetate (PHOSLO) 667 MG capsule Take 1 capsule (667 mg total) by mouth 3 (three)  times daily with meals. 11/19/19   Marinda Elk, MD  capsaicin (ZOSTRIX) 0.025 % cream Apply 1 application  topically every 8 (eight) hours as needed (pain). Apply to R & L knees, R & L shoulders, R & L wrists, R & L dorsal surfaces on hands, and upper or lower back.    [provider]  Dextran 70-Hypromellose (TEARS PURE) 0.1-0.3 % SOLN Place 1 drop into both eyes in the morning, at noon, in the evening, and at bedtime.    [provider]  dorzolamide-timolol (COSOPT) 22.3-6.8 MG/ML ophthalmic solution Place 1 drop into the left eye 2 (two) times daily. 09/23/19   Joseph Art, DO  famotidine (PEPCID) 20 MG tablet Take 20 mg by mouth daily. 05/10/22   [provider]  ferrous sulfate 325 (65 FE) MG tablet Take 1 tablet (325 mg total) by mouth daily. 06/15/22 07/15/22  Lanae Boast, MD  guaiFENesin (ROBITUSSIN) 100 MG/5ML liquid Take 100 mg by mouth every 4 (four) hours as needed for cough.    [provider]  hydrALAZINE (APRESOLINE) 25 MG tablet Take 25 mg by mouth 3 (three) times daily.    [provider]  hyoscyamine (ANASPAZ) 0.125 MG TBDP disintergrating tablet Place 0.125 mg under the tongue every 4 (four) hours as needed (excess secretions).    [provider]  latanoprost (XALATAN) 0.005 % ophthalmic solution Place 1 drop into  the left eye at bedtime. 09/23/19   Joseph Art, DO  losartan (COZAAR) 100 MG tablet Take 1 tablet (100 mg total) by mouth daily. 11/19/19   Marinda Elk, MD  Melatonin 10 MG CAPS Take 10 mg by mouth at bedtime.    [provider]  Menthol, Topical Analgesic, 5 % GEL Apply 1 application. topically every 8 (eight) hours as needed (hand/wrist pain).    [provider]  metolazone (ZAROXOLYN) 5 MG tablet Take 5 mg by mouth daily. 05/10/22   [provider]  Nutritional Supplements (FEEDING SUPPLEMENT, NEPRO CARB STEADY,) LIQD Take 120 mLs by mouth 2 (two) times daily.    [provider]  olopatadine (PATANOL) 0.1 % ophthalmic solution Place 2 drops into both eyes daily.    [provider]  ondansetron (ZOFRAN) 4 MG tablet Take 4 mg by mouth every 6 (six) hours as needed for nausea or vomiting.    [provider]  oxyCODONE-acetaminophen (PERCOCET/ROXICET) 5-325 MG tablet Take 1 tablet by mouth See admin instructions. Take 1 tablet by mouth two times a day every Tuesday, Thursday, Saturday and Sunday for pain. 06/04/22   [provider]  OXYGEN Inhale 3 L into the lungs continuous.    [provider]  polyethylene glycol (MIRALAX / GLYCOLAX) 17 g packet Take 17 g by mouth every 12 (twelve) hours as needed for mild constipation or moderate constipation.    [provider]  pramoxine-zinc acetate (CALADRYL) 1-0.1 % LOTN Apply 1 application. topically every 8 (eight) hours as needed (back, torso, arms, and legs for puritus).    [provider]  prednisoLONE acetate (PRED FORTE) 1 % ophthalmic suspension Place 1 drop into both eyes 4 (four) times daily.    [provider]  pregabalin (LYRICA) 25 MG capsule Take 1 capsule (25 mg total) by mouth daily. Patient taking differently: Take 25 mg by mouth 2 (two) times daily. 11/07/21   Narda Bonds, MD  promethazine (PHENERGAN) 12.5 MG tablet Take 12.5 mg by mouth every 6 (six) hours as needed for nausea or vomiting.    [provider]  sennosides-docusate sodium (SENOKOT-S) 8.6-50 MG tablet Take 2 tablets by mouth daily as needed for constipation.    [provider]  torsemide (DEMADEX) 100 MG tablet Take 100 mg by mouth 2 (two) times daily.    [provider]  vitamin B-12 1000 MCG tablet Take 1 tablet (1,000 mcg total) by mouth daily. 05/11/21   Rhetta Mura, MD  Vitamin D, Ergocalciferol, (DRISDOL) 1.25 MG (50000 UNIT) CAPS capsule Take 50,000 Units by mouth every Sunday.    [provider]      Allergies    Morphine  and codeine    Review of Systems   Review of Systems  Constitutional:  Positive for fatigue.  HENT:  Positive for ear pain.   Cardiovascular:  Negative for chest pain.  Neurological:  Positive for weakness.    Physical Exam Updated Vital Signs BP (!) 143/84   Pulse (!) 110   Temp 99.9 F (37.7 C) (Oral)   Resp (!) 22   SpO2 92%  Physical Exam Cardiovascular:     Heart sounds: Normal heart sounds.  Pulmonary:     Effort: Pulmonary effort is normal. No respiratory distress.     Breath sounds: Normal breath sounds.  Abdominal:     Palpations: Abdomen is soft.     Tenderness: There is abdominal tenderness.     ED  Results / Procedures / Treatments   Labs (all labs ordered are listed, but only abnormal results are displayed) Labs Reviewed  CBC WITH DIFFERENTIAL/PLATELET - Abnormal; Notable for the following components:      Result Value   WBC 14.2 (*)    RBC 3.48 (*)    Hemoglobin 10.4 (*)    HCT 31.7 (*)    RDW 17.2 (*)    Neutro Abs 12.3 (*)    Lymphs Abs 0.6 (*)    Monocytes Absolute 1.2 (*)    All other components within normal limits  COMPREHENSIVE METABOLIC PANEL - Abnormal; Notable for the following components:   Chloride 92 (*)    BUN 37 (*)    Creatinine, Ser 5.02 (*)    Total Protein 8.6 (*)    Alkaline Phosphatase 127 (*)    GFR, Estimated 13 (*)    Anion gap 19 (*)    All other components within normal limits  I-STAT VENOUS BLOOD GAS, ED - Abnormal; Notable for the following components:   pH, Ven 7.504 (*)    pCO2, Ven 35.0 (*)    pO2, Ven 108 (*)    Acid-Base Excess 4.0 (*)    Calcium, Ion 1.02 (*)    HCT 34.0 (*)    Hemoglobin 11.6 (*)    All other components within normal limits  CULTURE, BLOOD (ROUTINE X 2)  CULTURE, BLOOD (ROUTINE X 2)  LACTIC ACID, PLASMA  URINALYSIS, ROUTINE W REFLEX MICROSCOPIC  LACTIC ACID, PLASMA    EKG EKG Interpretation Date/Time:  Wednesday December 25 2022 18:18:15 EDT Ventricular Rate:  109 PR  Interval:  165 QRS Duration:  113 QT Interval:  350 QTC Calculation: 472 R Axis:   100  Text Interpretation: Sinus tachycardia Atrial premature complex Probable left atrial enlargement Left posterior fascicular block Borderline low voltage, extremity leads Nonspecific T abnormalities, lateral leads Since last tracing rate faster Otherwise no significant change Confirmed by Melene Plan [phone]) on 12/25/2022 6:22:30 PM  Radiology DG Chest Portable 1 View  Result Date: 12/25/2022 CLINICAL DATA:  Weakness. EXAM: PORTABLE CHEST 1 VIEW COMPARISON:  June 12, 2022 FINDINGS: There is mild to moderate severity enlargement of the cardiac silhouette with moderate severity bilateral airspace disease, most prominently within the mid and lower lung fields. A stable moderate sized right pleural effusion is seen. No pneumothorax is identified. The visualized skeletal structures are unremarkable. IMPRESSION: 1. Moderate severity bilateral airspace disease, most prominently within the mid and lower lung fields. 2. Stable moderate sized right pleural effusion. 3. Cardiomegaly. Electronically Signed   By: Aram Candela M.D.   On: 12/25/2022 20:13   CT ABDOMEN PELVIS WO CONTRAST  Result Date: 12/25/2022 CLINICAL DATA:  Acute abdominal pain. EXAM: CT ABDOMEN AND PELVIS WITHOUT CONTRAST TECHNIQUE: Multidetector CT imaging of the abdomen and pelvis was performed following the standard protocol without IV contrast. RADIATION DOSE REDUCTION: This exam was performed according to the departmental dose-optimization program which includes automated exposure control, adjustment of the mA and/or kV according to patient size and/or use of iterative reconstruction technique. COMPARISON:  CT chest abdomen and pelvis 05/06/2021 FINDINGS: Lower chest: There is a small right pleural effusion which appears slightly loculated. There is a focal rounded opacity in the posterior right lower lobe measuring 2.0 x 4.6 cm, possibly rounded  atelectasis, but other etiologies are not excluded. There is scarring in the left lower lobe. Hepatobiliary: Small gallstones are present. There is no biliary ductal dilatation. No focal liver lesions are  seen. Pancreas: Unremarkable. No pancreatic ductal dilatation or surrounding inflammatory changes. Spleen: Normal in size without focal abnormality. Adrenals/Urinary Tract: Adrenal glands are unremarkable. Kidneys are normal, without renal calculi, focal lesion, or hydronephrosis. Bladder is unremarkable. Stomach/Bowel: Stomach is within normal limits. Appendix appears normal. No evidence of bowel wall thickening, distention, or inflammatory changes. Vascular/Lymphatic: Aortic atherosclerosis. Peripheral vascular calcifications are present. No enlarged abdominal or pelvic lymph nodes. There are nonenlarged retroperitoneal lymph nodes. Reproductive: Extensive calcifications are seen throughout the penis, likely vascular. Prostate gland is within normal limits. Other: No abdominal wall hernia or abnormality. No abdominopelvic ascites. Musculoskeletal: No acute or significant osseous findings. IMPRESSION: 1. No acute localizing process in the abdomen or pelvis. 2. Cholelithiasis. 3. Small loculated right pleural effusion. 4. Rounded opacity in the right lower lobe may represent rounded atelectasis, but other etiologies such as pulmonary mass can not be excluded. Consider one of the following in 3 months for both low-risk and high-risk individuals: (a) repeat chest CT, (b) follow-up PET-CT, or (c) tissue sampling. This recommendation follows the consensus statement: Guidelines for Management of Incidental Pulmonary Nodules Detected on CT Images: From the Fleischner Society 2017; Radiology 2017; 284:228-243. Aortic Atherosclerosis (ICD10-I70.0). Electronically Signed   By: Darliss Cheney M.D.   On: 12/25/2022 19:49    Procedures Procedures    Medications Ordered in ED Medications  vancomycin (VANCOREADY) IVPB  1250 mg/250 mL (has no administration in time range)  piperacillin-tazobactam (ZOSYN) IVPB 3.375 g (3.375 g Intravenous New Bag/Given 12/25/22 2127)  iohexol (OMNIPAQUE) 350 MG/ML injection 75 mL (75 mLs Intravenous Contrast Given 12/25/22 1938)  sodium chloride 0.9 % bolus 250 mL (250 mLs Intravenous New Bag/Given 12/25/22 2052)    ED Course/ Medical Decision Making/ A&P                             Medical Decision Making Amount and/or Complexity of Data Reviewed Labs: ordered. Radiology: ordered.  Medical Decision Making:   [place] Antolin is a 52 y.o. male who presented to the ED today with weakness detailed above.    Patient's presentation is complicated by their history of ESRD, CHF, chronic respiratory failure.  Complete initial physical exam performed, notably the patient  was tender to abdominal palpation.    Reviewed and confirmed nursing documentation for past medical history, family history, social history.    Initial Assessment:   With the patient's presentation of weakness, most likely diagnosis is pneumonia. Other diagnoses were considered including (but not limited to) metabolic encephalopathy, sepsis, UTI, dehydration. These are considered less likely due to history of present illness and physical exam findings.    Initial Plan:   Screening labs including CBC and Metabolic panel to evaluate for infectious or metabolic etiology of disease.  Urinalysis with reflex culture ordered to evaluate for UTI or relevant urologic/nephrologic pathology.  CXR to evaluate for structural/infectious intrathoracic pathology.  EKG to evaluate for cardiac pathology Objective evaluation as below reviewed   Initial Study Results:   Laboratory  All laboratory results reviewed without evidence of clinically relevant pathology.   Exceptions: WBC: 14  EKG EKG was reviewed independently. Rate, rhythm, axis, intervals all examined and without medically relevant abnormality. ST segments without  concerns for elevations.    Radiology:  All images reviewed independently. Agree with radiology report at this time.   DG Chest Portable 1 View  Result Date: 12/25/2022 CLINICAL DATA:  Weakness. EXAM: PORTABLE CHEST 1 VIEW COMPARISON:  June 12, 2022 FINDINGS: There is mild to moderate severity enlargement of the cardiac silhouette with moderate severity bilateral airspace disease, most prominently within the mid and lower lung fields. A stable moderate sized right pleural effusion is seen. No pneumothorax is identified. The visualized skeletal structures are unremarkable. IMPRESSION: 1. Moderate severity bilateral airspace disease, most prominently within the mid and lower lung fields. 2. Stable moderate sized right pleural effusion. 3. Cardiomegaly. Electronically Signed   By: Aram Candela M.D.   On: 12/25/2022 20:13   CT ABDOMEN PELVIS WO CONTRAST  Result Date: 12/25/2022 CLINICAL DATA:  Acute abdominal pain. EXAM: CT ABDOMEN AND PELVIS WITHOUT CONTRAST TECHNIQUE: Multidetector CT imaging of the abdomen and pelvis was performed following the standard protocol without IV contrast. RADIATION DOSE REDUCTION: This exam was performed according to the departmental dose-optimization program which includes automated exposure control, adjustment of the mA and/or kV according to patient size and/or use of iterative reconstruction technique. COMPARISON:  CT chest abdomen and pelvis 05/06/2021 FINDINGS: Lower chest: There is a small right pleural effusion which appears slightly loculated. There is a focal rounded opacity in the posterior right lower lobe measuring 2.0 x 4.6 cm, possibly rounded atelectasis, but other etiologies are not excluded. There is scarring in the left lower lobe. Hepatobiliary: Small gallstones are present. There is no biliary ductal dilatation. No focal liver lesions are seen. Pancreas: Unremarkable. No pancreatic ductal dilatation or surrounding inflammatory changes. Spleen: Normal  in size without focal abnormality. Adrenals/Urinary Tract: Adrenal glands are unremarkable. Kidneys are normal, without renal calculi, focal lesion, or hydronephrosis. Bladder is unremarkable. Stomach/Bowel: Stomach is within normal limits. Appendix appears normal. No evidence of bowel wall thickening, distention, or inflammatory changes. Vascular/Lymphatic: Aortic atherosclerosis. Peripheral vascular calcifications are present. No enlarged abdominal or pelvic lymph nodes. There are nonenlarged retroperitoneal lymph nodes. Reproductive: Extensive calcifications are seen throughout the penis, likely vascular. Prostate gland is within normal limits. Other: No abdominal wall hernia or abnormality. No abdominopelvic ascites. Musculoskeletal: No acute or significant osseous findings. IMPRESSION: 1. No acute localizing process in the abdomen or pelvis. 2. Cholelithiasis. 3. Small loculated right pleural effusion. 4. Rounded opacity in the right lower lobe may represent rounded atelectasis, but other etiologies such as pulmonary mass can not be excluded. Consider one of the following in 3 months for both low-risk and high-risk individuals: (a) repeat chest CT, (b) follow-up PET-CT, or (c) tissue sampling. This recommendation follows the consensus statement: Guidelines for Management of Incidental Pulmonary Nodules Detected on CT Images: From the Fleischner Society 2017; Radiology 2017; [id number]:228-243. Aortic Atherosclerosis (ICD10-I70.0). Electronically Signed   By: Darliss Cheney M.D.   On: 12/25/2022 19:49    Reassessment and Plan:   Patient is a 52 year old man with past medical history significant for ESRD on dialysis, CHF, chronic respiratory failure, presenting with weakness and cough. On exam, patient was somnolent and unable to provide a complete history. Vitals in the ED were significant for tachycardia. Labs were significant for a leukocytosis of 14. CXR showed moderate severity bilateral airspace disease and a  moderate sized right pleural effusion. Patient was started on vancomycin and zosyn due to his history of dialysis. Hospitalist team was consulted for admission for treatment of pneumonia.           Final Clinical Impression(s) / ED Diagnoses Final diagnoses:  Weakness  Right ear pain    Rx / DC Orders ED Discharge Orders     None  Monna Fam, MD 12/25/22 2137    Melene Plan, DO 12/25/22 2141

## 2022-12-25 NOTE — Hospital Course (Addendum)
Christopher Davila is a 52 y.o.male with a history of ESRD on dialysis, CHF, chronic respiratory failure, diabetes, hypertension, blindness who was admitted to the family medicine teaching Service at [facility] for generalized weakness and pneumonia.   His hospital course is detailed below:  Pneumonia Presented with weakness, cough, shortness of breath.  Initially met SIRS criteria with fever, tachypnea and tachycardia.  Chest x-ray showed bilateral lower lung airspace disease.  Started on broad-spectrum antibiotics with Zosyn and vancomycin, which was narrowed to***for***days.   ESRD on HD Received dialysis on 7/10 on the day of admission at his outpatient center.  Did not appear acutely volume overloaded with urgent needs for a repeat session of dialysis.  Nephrology was/was not***consulted for routine HD needs while inpatient.   Other chronic conditions were medically managed with home medications and formulary alternatives as necessary (hypertension, hyperlipidemia, chronic pain)  PCP Follow-up Recommendations:

## 2022-12-25 NOTE — Assessment & Plan Note (Signed)
Baseline debility and fatigue.  Seems to have declined over the past 5 years. -PT/OT

## 2022-12-25 NOTE — Assessment & Plan Note (Signed)
Small loculated effusion noted on CT abdomen/pelvis. Has history of requiring thoracenteses for recurrent pleural effusions -Can consider IR consult if necessary

## 2022-12-25 NOTE — ED Notes (Signed)
ED TO INPATIENT HANDOFF REPORT  ED Nurse Name and Phone #: Theadora Rama RN   S Name/Age/Gender Christopher Davila 52 y.o. male Room/Bed: 031C/031C  Code Status   Code Status: Full Code  Home/SNF/Other Nursing Home Patient oriented to: self, place, time, and situation Is this baseline? Yes   Triage Complete: Triage complete  Chief Complaint Generalized weakness [R53.1]  Triage Note Pt BIB [facility] from [facility]. Since today, pt has been less responsive and weak. Pt was sent out for dialysis today with no improvement per staff. Per staff, pt is not as talkative as usual. No other complaints.  VSS.   Allergies Allergies  Allergen Reactions   Morphine And Codeine Itching and Other (See Comments)    Pt prefers not to be given this drug    Level of Care/Admitting Diagnosis ED Disposition     ED Disposition  Admit   Condition  --   Comment  Hospital Area: [facility] [[id number]]  Level of Care: Telemetry Medical [104]  May place patient in observation at [facility] or [facility] if equivalent level of care is available:: No  Covid Evaluation: Asymptomatic - no recent exposure (last 10 days) testing not required  Diagnosis: Generalized weakness [161096]  Admitting Physician: Darral Dash [[id number]  Attending Physician: Perley Jain, TODD D [[id number]]          B Medical/Surgery History Past Medical History:  Diagnosis Date   Acute on chronic diastolic CHF (congestive heart failure) (HCC) 09/27/2019   Acute respiratory failure with hypoxia (HCC) 03/26/2021   Acute urinary retention 10/13/2019   Anemia    Blindness 09/04/2019   CHF (congestive heart failure) (HCC)    COVID-19    Diabetes mellitus    Diarrhea 10/14/2019   Epigastric abdominal pain    ESRD (end stage renal disease) (HCC)    Gastroparesis due to DM (HCC)    Hypertension    Hypertensive urgency 04/17/2019   Hypervolemia    Hypoalbuminemia 04/17/2019   Hypoxia    Metabolic acidosis,  increased anion gap 10/13/2019   Multifocal pneumonia 05/06/2021   Neuropathy of lower extremity    Oral candidiasis 06/26/2012   Palliative care encounter    Pancreatitis 06/26/2012   Weight loss 06/26/2012   Past Surgical History:  Procedure Laterality Date   AV FISTULA PLACEMENT Right 10/29/2019   Procedure: RIGHT ARM BRACHIOBASILIC ARTERIOVENOUS (AV) FISTULA CREATION;  Surgeon: Larina Earthly, MD;  Location: [facility] OR;  Service: Vascular;  Laterality: Right;   BASCILIC VEIN TRANSPOSITION Right 01/05/2020   Procedure: RIGHT ARM SECOND STAGE BASCILIC VEIN TRANSPOSITION;  Surgeon: Larina Earthly, MD;  Location: [facility] OR;  Service: Vascular;  Laterality: Right;   EYE SURGERY     IR FLUORO GUIDE CV LINE RIGHT  10/28/2019   IR US GUIDE VASC ACCESS RIGHT  10/28/2019     A IV Location/Drains/Wounds Patient Lines/Drains/Airways Status     Active Line/Drains/Airways     Name Placement date Placement time Site Days   Peripheral IV 12/25/22 20 G Anterior;Left Forearm 12/25/22  1857  Forearm  less than 1   Fistula / Graft Right Upper arm Arteriovenous fistula 10/29/19  1319  Upper arm  1153   Wound / Incision (Open or Dehisced) 11/05/21 Skin tear Foot Left;Lower 11/05/21  2230  Foot  415   Wound / Incision (Open or Dehisced) 11/05/21 Skin tear Foot Anterior;Right 11/05/21  2230  Foot  415   Wound / Incision (Open or Dehisced) 11/05/21 Diabetic ulcer  Heel Left 11/05/21  2230  Heel  415            Intake/Output Last 24 hours  Intake/Output Summary (Last 24 hours) at 12/25/2022 2314 Last data filed at 12/25/2022 2130 Gross per 24 hour  Intake 250 ml  Output --  Net 250 ml    Labs/Imaging Results for orders placed or performed during the hospital encounter of 12/25/22 (from the past 48 hour(s))  CBC with Differential     Status: Abnormal   Collection Time: 12/25/22  6:52 PM  Result Value Ref Range   WBC 14.2 (H) 4.0 - 10.5 K/uL   RBC 3.48 (L) 4.22 - 5.81 MIL/uL   Hemoglobin 10.4 (L) 13.0 -  17.0 g/dL   HCT 82.9 (L) 56.2 - 13.0 %   MCV 91.1 80.0 - 100.0 fL   MCH 29.9 26.0 - 34.0 pg   MCHC 32.8 30.0 - 36.0 g/dL   RDW 86.5 (H) 78.4 - 69.6 %   Platelets 203 150 - 400 K/uL   nRBC 0.0 0.0 - 0.2 %   Neutrophils Relative % 88 %   Neutro Abs 12.3 (H) 1.7 - 7.7 K/uL   Lymphocytes Relative 4 %   Lymphs Abs 0.6 (L) 0.7 - 4.0 K/uL   Monocytes Relative 8 %   Monocytes Absolute 1.2 (H) 0.1 - 1.0 K/uL   Eosinophils Relative 0 %   Eosinophils Absolute 0.0 0.0 - 0.5 K/uL   Basophils Relative 0 %   Basophils Absolute 0.0 0.0 - 0.1 K/uL   Immature Granulocytes 0 %   Abs Immature Granulocytes 0.05 0.00 - 0.07 K/uL    Comment: Performed at [facility], [address] [place], [place] [address]  Comprehensive metabolic panel     Status: Abnormal   Collection Time: 12/25/22  6:52 PM  Result Value Ref Range   Sodium 137 135 - 145 mmol/L   Potassium 3.6 3.5 - 5.1 mmol/L   Chloride 92 (L) 98 - 111 mmol/L   CO2 26 22 - 32 mmol/L   Glucose, Bld 82 70 - 99 mg/dL    Comment: Glucose reference range applies only to samples taken after fasting for at least 8 hours.   BUN 37 (H) 6 - 20 mg/dL   Creatinine, Ser 4.13 (H) 0.61 - 1.24 mg/dL   Calcium 9.4 8.9 - 24.4 mg/dL   Total Protein 8.6 (H) 6.5 - 8.1 g/dL   Albumin 3.5 3.5 - 5.0 g/dL   AST 19 15 - 41 U/L   ALT 26 0 - 44 U/L   Alkaline Phosphatase 127 (H) 38 - 126 U/L   Total Bilirubin 1.1 0.3 - 1.2 mg/dL   GFR, Estimated 13 (L) >60 mL/min    Comment: (NOTE) Calculated using the CKD-EPI Creatinine Equation (2021)    Anion gap 19 (H) 5 - 15    Comment: Performed at [facility], [address] [place], [place] [address]  Lactic acid, plasma     Status: None   Collection Time: 12/25/22  6:52 PM  Result Value Ref Range   Lactic Acid, Venous 1.3 0.5 - 1.9 mmol/L    Comment: Performed at [facility], [address], [place], [place] [address]  I-Stat venous blood gas, [facility], [facility], [facility])     Status: Abnormal   Collection  Time: 12/25/22  7:36 PM  Result Value Ref Range   pH, Ven 7.504 (H) 7.25 - 7.43   pCO2, Ven 35.0 (  L) 44 - 60 mmHg   pO2, Ven 108 (H) 32 - 45 mmHg   Bicarbonate 27.5 20.0 - 28.0 mmol/L   TCO2 29 22 - 32 mmol/L   O2 Saturation 99 %   Acid-Base Excess 4.0 (H) 0.0 - 2.0 mmol/L   Sodium 137 135 - 145 mmol/L   Potassium 3.6 3.5 - 5.1 mmol/L   Calcium, Ion 1.02 (L) 1.15 - 1.40 mmol/L   HCT 34.0 (L) 39.0 - 52.0 %   Hemoglobin 11.6 (L) 13.0 - 17.0 g/dL   Sample type VENOUS   Lactic acid, plasma     Status: None   Collection Time: 12/25/22  9:27 PM  Result Value Ref Range   Lactic Acid, Venous 1.1 0.5 - 1.9 mmol/L    Comment: Performed at [facility], [address], [place], [place] [address]   DG Chest Portable 1 View  Result Date: 12/25/2022 CLINICAL DATA:  Weakness. EXAM: PORTABLE CHEST 1 VIEW COMPARISON:  June 12, 2022 FINDINGS: There is mild to moderate severity enlargement of the cardiac silhouette with moderate severity bilateral airspace disease, most prominently within the mid and lower lung fields. A stable moderate sized right pleural effusion is seen. No pneumothorax is identified. The visualized skeletal structures are unremarkable. IMPRESSION: 1. Moderate severity bilateral airspace disease, most prominently within the mid and lower lung fields. 2. Stable moderate sized right pleural effusion. 3. Cardiomegaly. Electronically Signed   By: Aram Candela M.D.   On: 12/25/2022 20:13   CT ABDOMEN PELVIS WO CONTRAST  Result Date: 12/25/2022 CLINICAL DATA:  Acute abdominal pain. EXAM: CT ABDOMEN AND PELVIS WITHOUT CONTRAST TECHNIQUE: Multidetector CT imaging of the abdomen and pelvis was performed following the standard protocol without IV contrast. RADIATION DOSE REDUCTION: This exam was performed according to the departmental dose-optimization program which includes automated exposure control, adjustment of the mA and/or kV according to patient size and/or use of iterative  reconstruction technique. COMPARISON:  CT chest abdomen and pelvis 05/06/2021 FINDINGS: Lower chest: There is a small right pleural effusion which appears slightly loculated. There is a focal rounded opacity in the posterior right lower lobe measuring 2.0 x 4.6 cm, possibly rounded atelectasis, but other etiologies are not excluded. There is scarring in the left lower lobe. Hepatobiliary: Small gallstones are present. There is no biliary ductal dilatation. No focal liver lesions are seen. Pancreas: Unremarkable. No pancreatic ductal dilatation or surrounding inflammatory changes. Spleen: Normal in size without focal abnormality. Adrenals/Urinary Tract: Adrenal glands are unremarkable. Kidneys are normal, without renal calculi, focal lesion, or hydronephrosis. Bladder is unremarkable. Stomach/Bowel: Stomach is within normal limits. Appendix appears normal. No evidence of bowel wall thickening, distention, or inflammatory changes. Vascular/Lymphatic: Aortic atherosclerosis. Peripheral vascular calcifications are present. No enlarged abdominal or pelvic lymph nodes. There are nonenlarged retroperitoneal lymph nodes. Reproductive: Extensive calcifications are seen throughout the penis, likely vascular. Prostate gland is within normal limits. Other: No abdominal wall hernia or abnormality. No abdominopelvic ascites. Musculoskeletal: No acute or significant osseous findings. IMPRESSION: 1. No acute localizing process in the abdomen or pelvis. 2. Cholelithiasis. 3. Small loculated right pleural effusion. 4. Rounded opacity in the right lower lobe may represent rounded atelectasis, but other etiologies such as pulmonary mass can not be excluded. Consider one of the following in 3 months for both low-risk and high-risk individuals: (a) repeat chest CT, (b) follow-up PET-CT, or (c) tissue sampling. This recommendation follows the consensus statement: Guidelines for Management of Incidental Pulmonary Nodules Detected on CT  Images: From the Fleischner Society 2017; Radiology 2017; [phone]. Aortic Atherosclerosis (ICD10-I70.0). Electronically Signed   By: Darliss Cheney M.D.   On: 12/25/2022 19:49    Pending Labs Unresulted Labs (From admission, onward)     Start     Ordered   12/26/22 0500  CBC  Tomorrow morning,   R        12/25/22 2249   12/26/22 0500  Basic metabolic panel  Tomorrow morning,   R        12/25/22 2249   12/25/22 2250  Resp panel by RT-PCR (RSV, Flu A&B, Covid) Anterior Nasal Swab  (Tier 2 - SARS Coronavirus 2 by RT PCR (hospital order, performed in [facility] hospital lab) *cepheid single result test*)  Once,   R        12/25/22 2249   12/25/22 2249  Ethanol  ONCE - URGENT,   URGENT        12/25/22 2249   12/25/22 2249  Rapid urine drug screen (hospital performed)  ONCE - STAT,   STAT        12/25/22 2249   12/25/22 2248  SARS Coronavirus 2 by RT PCR (hospital order, performed in [facility] hospital lab) *cepheid single result test* Anterior Nasal Swab  (Tier 2 - SARS Coronavirus 2 by RT PCR (hospital order, performed in [facility] hospital lab) *cepheid single result test*)  Once,   R        12/25/22 2249   12/25/22 2247  Respiratory (~20 pathogens) panel by PCR  (Respiratory panel by PCR (~20 pathogens, ~24 hr TAT)  w precautions)  ONCE - URGENT,   URGENT        12/25/22 2249   12/25/22 2246  Hemoglobin A1c  Once,   R        12/25/22 2249   12/25/22 2227  MRSA Next Gen by PCR, Nasal  (MRSA Screening)  Once,   URGENT        12/25/22 2226   12/25/22 2028  Blood culture (routine x 2)  BLOOD CULTURE X 2,   R      12/25/22 2027   12/25/22 1822  Urinalysis, Routine w reflex microscopic -Urine, Catheterized  Once,   URGENT       Question:  Specimen Source  Answer:  Urine, Catheterized   12/25/22 1826            Vitals/Pain Today's Vitals   12/25/22 1821 12/25/22 1824 12/25/22 2100 12/25/22 2145  BP:   (!) 148/83   Pulse:      Resp:   (!) 24 (!) 28  Temp: 99.9 F (37.7 C)   (!) 101 F (38.3 C)   TempSrc: Oral  Rectal   SpO2:  92%  99%    Isolation Precautions Airborne and Contact precautions  Medications Medications  vancomycin (VANCOREADY) IVPB 1250 mg/250 mL (1,250 mg Intravenous New Bag/Given 12/25/22 2205)  piperacillin-tazobactam (ZOSYN) IVPB 2.25 g (has no administration in time range)  vancomycin variable dose per unstable renal function (pharmacist dosing) (has no administration in time range)  aspirin EC tablet 81 mg (has no administration in time range)  atorvastatin (LIPITOR) tablet 40 mg (has no administration in time range)  calcium acetate (PHOSLO) capsule 667 mg (has no administration in time range)  senna-docusate (Senokot-S) tablet 2 tablet (has no administration in time range)  atropine 1 % ophthalmic solution 1 drop (has no administration in time range)  latanoprost (XALATAN) 0.005 % ophthalmic solution 1 drop (  has no administration in time range)  heparin injection 5,000 Units (has no administration in time range)  acetaminophen (TYLENOL) tablet 500 mg (has no administration in time range)  oxyCODONE-acetaminophen (PERCOCET/ROXICET) 5-325 MG per tablet 1 tablet (has no administration in time range)  iohexol (OMNIPAQUE) 350 MG/ML injection 75 mL (75 mLs Intravenous Contrast Given 12/25/22 1938)  sodium chloride 0.9 % bolus 250 mL (0 mLs Intravenous Stopped 12/25/22 2130)    Mobility non-ambulatory     Focused Assessments Cardiac Assessment Handoff:  Cardiac Rhythm: Sinus tachycardia Lab Results  Component Value Date   CKTOTAL 84 05/06/2021   CKMB 0.9 12/22/2007   TROPONINI <0.03 05/24/2015   Lab Results  Component Value Date   DDIMER 5.47 (H) 03/31/2021   Does the Patient currently have chest pain? No   , Neuro Assessment Handoff:  Swallow screen pass? Yes  Cardiac Rhythm: Sinus tachycardia       Neuro Assessment:   Neuro Checks:      Has TPA been given? No If patient is a Neuro Trauma and patient is going to OR  before floor call report to 4N Charge nurse: [phone] or [phone]  , Pulmonary Assessment Handoff:  Lung sounds:   O2 Device: Room Air O2 Flow Rate (L/min): 4 L/min    R Recommendations: See Admitting Provider Note  Report given to:   Additional Notes:

## 2022-12-25 NOTE — H&P (Cosign Needed Addendum)
Hospital Admission History and Physical Service Pager: [phone]  Patient name: Christopher Davila Medical record number: [id number] Date of Birth: December 18, 1970 Age: 52 y.o. Gender: male  Primary Care Provider: Karna Dupes, MD Consultants: None Code Status: FULL which was confirmed with family if patient unable to confirm   Preferred Emergency Contact:  Contact Information     Name Relation Home Work Mobile   [place] Cousin   [phone]   Arnold, Kester Relative [phone]         Chief Complaint: Weakness, cough, AMS  Assessment and Plan: Christopher Davila is a 52 y.o. male presenting with weakness, cough and decreased responsiveness per SNF facility [facility]). Differential for presentation of this includes acute metabolic encephalopathy, bacterial versus viral pneumonia, sepsis, other infectious process including UTI, acute on chronic weakness, pleural effusion contributing to shortness of breath.   Per chart review, patient has frequent admissions for encephalopathy/generalized weakness typically in the setting of infectious process, namely pneumonia, dating back as far as 2021.  PMH: Hx of generalized weakness (per chart review, since 2021 and follows outpatient with palliative care), blindness, ESRD on HD M/W/F, HFpEF, chronic respiratory failure with hypoxia, HTN, depression   Hospital Problem List      Hospital     * (Principal) Pneumonia     Initially met SIRS criteria with tachypnea, tachycardia, febrile to 101  Fahrenheit.  Bilateral moderate severity airspace disease in lower lung fields on chest x-ray and CT abdomen/pelvis. History of recurrent pneumonia requiring hospitalization, not within the  past 30 days. -Admit to FM TS med/tele, Dr. McDiarmid attending -Continue IV antibiotics (s/p Zosyn and vancomycin) does have risk factors for MRSA  given resident of living facility -MRSA swab, de-escalate antibiotics as appropriate      Acute encephalopathy      A&O x4 on exam.  Transient alteration in mental status earlier this  evening seems to have resolved. Possibly altered mentation in light of infectious process with pneumonia, now s/p IV antibiotics -CT head without contrast STAT -AM CBC, BMP -TSH      Generalized weakness     Baseline debility and fatigue.  Seems to have declined over the past 5  years. -PT/OT      ESRD on dialysis [facility])     Completed HD today. Does not appear hypervolemic, or have electrolyte derangements, or any  other urgent need for repeat dialysis session tonight. -Consult to nephrology if patient remains inpatient with HD needs        History of thoracentesis     Small loculated effusion noted on CT abdomen/pelvis. Has history of requiring thoracenteses for recurrent pleural effusions -Can consider IR consult if necessary    Type 2 diabetes mellitus with hypoglycemia (HCC)     Obtain A1c    FEN/GI: Renal diet with fluid restriction VTE Prophylaxis: Heparin subq 5000 units every 8 hours  Disposition: Med-surg  History of Present Illness:  Christopher Davila is a 52 y.o. male presenting with weakness, cough and shortness of breath. Patient reports he "slumped over, cannot catch his breath" earlier today after dialysis.  He did say that he has not been feeling well since the day before though. Completed a full session of HD that lasted for hours, reports they took off 4 L. Denies nausea, vomiting, diarrhea.  Did report some epigastric abdominal pain earlier today.  He reports he is hungry and wants food. Denies any wounds or ulcers on his body that he is aware of. Says he  still makes urine, around 1-2 times per day.  He is oriented to self, location, year and president. I called and spoke with [facility] nursing facility who confirms that when he returned  after HD, he was slumped to the right side in wheelchair and was lethargic and did not improve after some time, he admitted to "not feeling well" and  requested to go to the ER.  I called and spoke with healthcare power of attorney, cousin [name]) who says that she has not seen him lately, she just knows he is a diabetic and kidney failure.  In the ED, he had a chest x-ray that was concerning for bilateral airspace disease in the mid and lower lung fields.  He also had a CT abdomen/pelvis without contrast that showed a small loculated pleural effusion and round opacity in right lower lobe.  He was initially tachycardic in the 100s, tachypneic and developed a fever 101 Fahrenheit.  Blood cultures were obtained.  Leukocytosis noted on CBC.  Started on broad-spectrum antibiotics with Zosyn and vancomycin.  Review Of Systems: Per HPI  Pertinent Past Medical History: History of generalized weakness ESRD on HD HFpEF Chronic respiratory failure HTN Depression  Remainder reviewed in history tab.   Pertinent Past Surgical History:  Remainder reviewed in history tab.  Pertinent Social History: Tobacco use: Yes/No/Former Alcohol use: Former significant alcohol use Other Substance use: Denies Lives at [facility] [place] in [place]  Pertinent Family History: family history includes CAD in his cousin and maternal aunt; Diabetes in his maternal aunt and mother; Hypertension in his mother; Lung disease in his mother.  Remainder reviewed in history tab.   Important Outpatient Medications: Per SNF: Amlodipine 5 mg daily Aspirin 81 mg daily Atorvastatin 40 mg daily PhosLo 667 mg capsule daily Hydralazine 25 mg 3 times daily as needed for high blood pressure Losartan 100 mg daily Melatonin 10 mg nightly Pregabalin 25 mg (2 capsules) twice daily Senna daily Vitamin B-12 Vitamin D Oxycodone-acetaminophen 5-325 every 6 hours as needed Oxycodone-acetaminophen 5-325 twice daily scheduled Remainder reviewed in medication history.   Objective: BP (!) 148/83   Pulse (!) 110   Temp (!) 101 F (38.3 C) (Rectal)   Resp (!) 28   SpO2  99%  Exam: General: Chronically ill-appearing, laying in bed leaning to the right side, no distress with eyes closed Eyes: Cataract in left eye, chronic eye disease changes, clear discharge in right eye ENTM: Poor dentition Neck: No JVD Cardiovascular: Regular rate and rhythm, heart sounds somewhat distant Respiratory: Normal work of breathing on 6 L nasal cannula, turned down to 4 L and tolerated well with SpO2 in the high 90s.  Bibasilar crackles.  No wheezing. Gastrointestinal: Soft, nontender and nondistended MSK: No joint deformities, moves all extremities equally Derm: Tattoos on bilateral forearms, no lesions or wounds on visible skin Neuro: A&O x 4, speech clear and fluent.  No focal neurological deficits Psych: Mood appropriate  Labs:  CBC BMET  Recent Labs  Lab 12/25/22 1852 12/25/22 1936  WBC 14.2*  --   HGB 10.4* 11.6*  HCT 31.7* 34.0*  PLT 203  --    Recent Labs  Lab 12/25/22 1852 12/25/22 1936  NA 137 137  K 3.6 3.6  CL 92*  --   CO2 26  --   BUN 37*  --   CREATININE 5.02*  --   GLUCOSE 82  --   CALCIUM 9.4  --      EKG: NSR, a lot  of artifact. Plan to repeat   Imaging Studies Performed: DG Chest Portable 1 View  Result Date: 12/25/2022 CLINICAL DATA:  Weakness. EXAM: PORTABLE CHEST 1 VIEW COMPARISON:  June 12, 2022 FINDINGS: There is mild to moderate severity enlargement of the cardiac silhouette with moderate severity bilateral airspace disease, most prominently within the mid and lower lung fields. A stable moderate sized right pleural effusion is seen. No pneumothorax is identified. The visualized skeletal structures are unremarkable. IMPRESSION: 1. Moderate severity bilateral airspace disease, most prominently within the mid and lower lung fields. 2. Stable moderate sized right pleural effusion. 3. Cardiomegaly. Electronically Signed   By: Aram Candela M.D.   On: 12/25/2022 20:13   CT ABDOMEN PELVIS WO CONTRAST  Result Date:  12/25/2022 CLINICAL DATA:  Acute abdominal pain. EXAM: CT ABDOMEN AND PELVIS WITHOUT CONTRAST TECHNIQUE: Multidetector CT imaging of the abdomen and pelvis was performed following the standard protocol without IV contrast. RADIATION DOSE REDUCTION: This exam was performed according to the departmental dose-optimization program which includes automated exposure control, adjustment of the mA and/or kV according to patient size and/or use of iterative reconstruction technique. COMPARISON:  CT chest abdomen and pelvis 05/06/2021 FINDINGS: Lower chest: There is a small right pleural effusion which appears slightly loculated. There is a focal rounded opacity in the posterior right lower lobe measuring 2.0 x 4.6 cm, possibly rounded atelectasis, but other etiologies are not excluded. There is scarring in the left lower lobe. Hepatobiliary: Small gallstones are present. There is no biliary ductal dilatation. No focal liver lesions are seen. Pancreas: Unremarkable. No pancreatic ductal dilatation or surrounding inflammatory changes. Spleen: Normal in size without focal abnormality. Adrenals/Urinary Tract: Adrenal glands are unremarkable. Kidneys are normal, without renal calculi, focal lesion, or hydronephrosis. Bladder is unremarkable. Stomach/Bowel: Stomach is within normal limits. Appendix appears normal. No evidence of bowel wall thickening, distention, or inflammatory changes. Vascular/Lymphatic: Aortic atherosclerosis. Peripheral vascular calcifications are present. No enlarged abdominal or pelvic lymph nodes. There are nonenlarged retroperitoneal lymph nodes. Reproductive: Extensive calcifications are seen throughout the penis, likely vascular. Prostate gland is within normal limits. Other: No abdominal wall hernia or abnormality. No abdominopelvic ascites. Musculoskeletal: No acute or significant osseous findings. IMPRESSION: 1. No acute localizing process in the abdomen or pelvis. 2. Cholelithiasis. 3. Small  loculated right pleural effusion. 4. Rounded opacity in the right lower lobe may represent rounded atelectasis, but other etiologies such as pulmonary mass can not be excluded. Consider one of the following in 3 months for both low-risk and high-risk individuals: (a) repeat chest CT, (b) follow-up PET-CT, or (c) tissue sampling. This recommendation follows the consensus statement: Guidelines for Management of Incidental Pulmonary Nodules Detected on CT Images: From the Fleischner Society 2017; Radiology 2017; 284:228-243. Aortic Atherosclerosis (ICD10-I70.0). Electronically Signed   By: Darliss Cheney M.D.   On: 12/25/2022 19:49      Darral Dash, DO 12/25/2022, 10:56 PM PGY-3, [facility]  FPTS Intern pager: [phone], text pages welcome Secure chat group [facility] Teaching Service

## 2022-12-25 NOTE — Progress Notes (Signed)
Pharmacy Antibiotic Note  Christopher Davila is a 52 y.o. male for which pharmacy has been consulted for vancomycin dosing for pneumonia.  Patient with a history of ESRD MWF, HF, chronic respiratory failure, blindness. Patient presenting with from NF with weakness.  WBC 14.2; LA 1.1; T 101; HR 110; RR 28  Plan: Zosyn 3.375g x1 in ED -- continue with 2.25g q8h Vancomycin 1250mg  given x 1 in ED --  repeat dosing per HD schedule. Pharmacy to follow. Monitor WBC, fever, renal function, cultures De-escalate when able F/u Nephrology plan F/u MRSA PCR     Temp (24hrs), Avg:99.9 F (37.7 C), Min:99.9 F (37.7 C), Max:99.9 F (37.7 C)  Recent Labs  Lab 12/25/22 1852  WBC 14.2*  CREATININE 5.02*  LATICACIDVEN 1.3    CrCl cannot be calculated (Unknown ideal weight.).    Allergies  Allergen Reactions   Morphine And Codeine Itching and Other (See Comments)    Pt prefers not to be given this drug   Microbiology results: Pending  Thank you for allowing pharmacy to be a part of this patient's care.  [name] Landau, PharmD, BCPS 12/25/2022 9:54 PM ED Clinical Pharmacist -  [phone]

## 2022-12-25 NOTE — Assessment & Plan Note (Signed)
Obtain A1c.  

## 2022-12-25 NOTE — Assessment & Plan Note (Addendum)
A&O x4 on exam.  Transient alteration in mental status earlier this evening seems to have resolved. Possibly altered mentation in light of infectious process with pneumonia seen on chest x-ray and CT abdomen/pelvis, now s/p IV antibiotics -Admit to FM TS progressive, Dr. Jacquelyne Balint attending -Continue IV antibiotics (s/p Zosyn and vancomycin) -CT head without contrast STAT -AM CBC, BMP -TSH

## 2022-12-25 NOTE — ED Triage Notes (Signed)
Pt BIB [facility] from [place]. Since today, pt has been less responsive and weak. Pt was sent out for dialysis today with no improvement per staff. Per staff, pt is not as talkative as usual. No other complaints.  VSS.

## 2022-12-26 ENCOUNTER — Other Ambulatory Visit: Payer: Self-pay

## 2022-12-26 ENCOUNTER — Encounter (HOSPITAL_COMMUNITY): Payer: Self-pay | Admitting: Student

## 2022-12-26 DIAGNOSIS — J189 Pneumonia, unspecified organism: Principal | ICD-10-CM

## 2022-12-26 DIAGNOSIS — Z992 Dependence on renal dialysis: Secondary | ICD-10-CM

## 2022-12-26 DIAGNOSIS — N186 End stage renal disease: Secondary | ICD-10-CM | POA: Diagnosis not present

## 2022-12-26 DIAGNOSIS — J90 Pleural effusion, not elsewhere classified: Secondary | ICD-10-CM | POA: Diagnosis present

## 2022-12-26 DIAGNOSIS — Z22322 Carrier or suspected carrier of Methicillin resistant Staphylococcus aureus: Secondary | ICD-10-CM

## 2022-12-26 DIAGNOSIS — G9340 Encephalopathy, unspecified: Secondary | ICD-10-CM | POA: Diagnosis not present

## 2022-12-26 DIAGNOSIS — E11649 Type 2 diabetes mellitus with hypoglycemia without coma: Secondary | ICD-10-CM | POA: Diagnosis not present

## 2022-12-26 LAB — BASIC METABOLIC PANEL
Anion gap: 18 — ABNORMAL HIGH (ref 5–15)
BUN: 48 mg/dL — ABNORMAL HIGH (ref 6–20)
CO2: 22 mmol/L (ref 22–32)
Calcium: 9.2 mg/dL (ref 8.9–10.3)
Chloride: 95 mmol/L — ABNORMAL LOW (ref 98–111)
Creatinine, Ser: 6.27 mg/dL — ABNORMAL HIGH (ref 0.61–1.24)
GFR, Estimated: 10 mL/min — ABNORMAL LOW (ref >60)
Glucose, Bld: 77 mg/dL (ref 70–99)
Potassium: 3.9 mmol/L (ref 3.5–5.1)
Sodium: 135 mmol/L (ref 135–145)

## 2022-12-26 LAB — RESPIRATORY PANEL BY PCR

## 2022-12-26 LAB — RESP PANEL BY RT-PCR (RSV, FLU A&B, COVID)  RVPGX2
Influenza A by PCR: NEGATIVE
Influenza B by PCR: NEGATIVE
Resp Syncytial Virus by PCR: NEGATIVE
SARS Coronavirus 2 by RT PCR: NEGATIVE

## 2022-12-26 LAB — GLUCOSE, CAPILLARY
Glucose-Capillary: 80 mg/dL (ref 70–99)
Glucose-Capillary: 78 mg/dL (ref 70–99)
Glucose-Capillary: 215 mg/dL — ABNORMAL HIGH (ref 70–99)

## 2022-12-26 LAB — CBC
HCT: 29.9 % — ABNORMAL LOW (ref 39.0–52.0)
Hemoglobin: 9.6 g/dL — ABNORMAL LOW (ref 13.0–17.0)
MCH: 29.4 pg (ref 26.0–34.0)
MCHC: 32.1 g/dL (ref 30.0–36.0)
MCV: 91.7 fL (ref 80.0–100.0)
Platelets: 168 10*3/uL (ref 150–400)
RBC: 3.26 MIL/uL — ABNORMAL LOW (ref 4.22–5.81)
RDW: 17.2 % — ABNORMAL HIGH (ref 11.5–15.5)
WBC: 13.7 10*3/uL — ABNORMAL HIGH (ref 4.0–10.5)
nRBC: 0 % (ref 0.0–0.2)

## 2022-12-26 LAB — MRSA NEXT GEN BY PCR, NASAL: MRSA by PCR Next Gen: DETECTED — AB

## 2022-12-26 LAB — ETHANOL: Alcohol, Ethyl (B): <10 mg/dL (ref <10)

## 2022-12-26 MED ORDER — DOXYCYCLINE HYCLATE 100 MG PO TABS: 100.0000 mg | ORAL_TABLET | Freq: Two times a day (BID) | ORAL | Status: AC

## 2022-12-26 MED ORDER — DOXYCYCLINE HYCLATE 100 MG PO TABS
100.0000 mg | ORAL_TABLET | Freq: Two times a day (BID) | ORAL | Status: DC
  Administered 2022-12-26: 100 mg via ORAL
  Filled 2022-12-26: qty 1

## 2022-12-26 MED ORDER — MUPIROCIN 2 % EX OINT
1.0000 | TOPICAL_OINTMENT | Freq: Two times a day (BID) | CUTANEOUS | Status: DC
  Administered 2022-12-26: 1 via NASAL
  Filled 2022-12-26: qty 22

## 2022-12-26 MED ORDER — SENNOSIDES-DOCUSATE SODIUM 8.6-50 MG PO TABS
1.0000 | ORAL_TABLET | Freq: Two times a day (BID) | ORAL | Status: DC
  Administered 2022-12-26: 1 via ORAL
  Filled 2022-12-26: qty 1

## 2022-12-26 MED ORDER — PREGABALIN 25 MG PO CAPS
25.0000 mg | ORAL_CAPSULE | Freq: Every day | ORAL | Status: DC
  Administered 2022-12-26: 25 mg via ORAL
  Filled 2022-12-26: qty 1

## 2022-12-26 MED ORDER — CEFDINIR 300 MG PO CAPS: ORAL_CAPSULE | ORAL | Status: DC

## 2022-12-26 MED ORDER — CEFDINIR 300 MG PO CAPS
300.0000 mg | ORAL_CAPSULE | Freq: Every day | ORAL | Status: DC
  Administered 2022-12-26: 300 mg via ORAL
  Filled 2022-12-26: qty 1

## 2022-12-26 MED ORDER — ORAL CARE MOUTH RINSE: 15.0000 mL | OROMUCOSAL | Status: DC | PRN

## 2022-12-26 MED ORDER — POLYETHYLENE GLYCOL 3350 17 G PO PACK
17.0000 g | PACK | Freq: Every day | ORAL | Status: DC
  Administered 2022-12-26: 17 g via ORAL
  Filled 2022-12-26: qty 1

## 2022-12-26 MED ORDER — CHLORHEXIDINE GLUCONATE CLOTH 2 % EX PADS
6.0000 | MEDICATED_PAD | Freq: Every day | CUTANEOUS | Status: DC
  Administered 2022-12-26: 6 via TOPICAL

## 2022-12-26 NOTE — Progress Notes (Signed)
New Admission Note:   Arrival Method: Via Stretcher from ED Mental Orientation:  A & Ox4 Telemetry: N/A Assessment: Completed Skin:  See Skin Assessment IV:  Rt FA NSL Pain: Bilateral Lower Extremity Pain - 10/10  Tubes:  N/A Safety Measures: Safety Fall Prevention Plan has been given, discussed - Patient unable to sign d/t blindness Admission: Completed 5 MW Orientation: Patient has been orientated to the room, unit and staff.  Family:  None at bedside  Patient is from [facility].  He is blind.  Per patient, he is only able to stand briefly and is otherwise, WC bound.  He is currently A & Ox4.  He has two wounds - one to his Right Lateral Leg and the second one to his left lateral foot.  Otherwise, skin is dry but intact.  He does have several tattoos to bilateral arms.  He has poor dentition and multiple missing teeth.  He brought his clothing, cell phone, and wallet to the hospital.  He refused to have his wallet sent to the hospital safe.  Orders have been reviewed and implemented. Will continue to monitor the patient. Call light has been placed within reach and bed alarm has been activated.   Bernie Covey RN Phone number: [phone]

## 2022-12-26 NOTE — Assessment & Plan Note (Addendum)
Small loculated effusion noted on CT abdomen/pelvis. Has history of requiring thoracenteses for recurrent pleural effusions -will consult IR today for thoracentesis

## 2022-12-26 NOTE — Assessment & Plan Note (Signed)
Obtain A1c.  

## 2022-12-26 NOTE — Discharge Summary (Signed)
[facility] Discharge Summary  Patient name: Christopher Davila Medical record number: [id number] Date of birth: December 28, 1970 Age: 52 y.o. Gender: male Date of Admission: 12/25/2022  Date of Discharge: 12/26/22  Admitting Physician: Darral Dash, DO  Primary Care Provider: Karna Dupes, MD Consultants: None  Indication for Hospitalization: AMS, cough  Discharge Diagnoses/Problem List:  Principal Problem for Admission: Pneumonia  Other Problems addressed during stay:  Principal Problem:   Pneumonia Active Problems:   Acute encephalopathy   Weakness   ESRD on dialysis [facility])   History of thoracentesis   Type 2 diabetes mellitus with hypoglycemia (HCC)   MRSA (methicillin resistant Staphylococcus aureus) nasal colonization   Loculated pleural effusion    Brief Hospital Course:  Christopher Davila is a 52 y.o.male with a history of ESRD on dialysis, CHF, chronic respiratory failure, diabetes, hypertension, blindness who was admitted to the family medicine teaching Service at [facility] for generalized weakness and pneumonia.   His hospital course is detailed below:  Pneumonia Presented with weakness, cough, shortness of breath.  Initially met SIRS criteria with fever, tachypnea and tachycardia.  Chest x-ray showed bilateral lower lung airspace disease.  CT found small R loculated pleural effusion which seems to be a chronic finding for which he has received a thoracentesis in the past. As this is a chronic issue, a thoracentesis was not completed in the hospital. Started on broad-spectrum antibiotics with Zosyn and vancomycin, which was narrowed to doxycyline and cefdinir. He will be getting two extra doses of cefdinier on 7/12 and 7/15 to be taken after HD. He will finish doxycycline 100 mg BID on 7/15.   Chronic respiratory failure Patient has history of chronic respiratory failure. He has a home oxygen requirement of 4L. Patient weened to home oxygen prior to  discharge.   Acute Encephalopathy Patient altered according to SNF, likely in setting of infection. 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 Patient A&Ox4 at discharge.    ESRD on HD Received dialysis on 7/10 on the day of admission at his outpatient center.  Did not appear acutely volume overloaded with urgent needs for a repeat session of dialysis.  Nephrology was not consulted for routine HD needs while inpatient.   Other chronic conditions were medically managed with home medications and formulary alternatives as necessary (hypertension, hyperlipidemia, chronic pain)  PCP Follow-up Recommendations: - Ensure patient has received all antibiotics and finished course  - Patient has chronic respiratory failure, please complete respiratory exam  - Patient ESRD on dialysis, please ensure patient is following appropriately    Disposition: SNF  Discharge Condition: Stable  Discharge Exam:  Vitals:   12/26/22 0519 12/26/22 0829  BP: 136/76 (!) 152/97  Pulse: 83 95  Resp: 17 17  Temp: 98.3 F (36.8 C) 98.4 F (36.9 C)  SpO2: 97% 97%   Physical Exam Vitals reviewed.  HENT:     Head: Normocephalic.  Eyes:     Comments: Cataract in left eye, chronic eye disease changes, clear discharge in right eye. Patient is blind  Cardiovascular:     Rate and Rhythm: Normal rate and regular rhythm.  Pulmonary:     Comments: Bibasilar crackles. On 4L [place]. NWOB Abdominal:     Comments: Slightly distended. Not TTP  Neurological:     Mental Status: He is alert.      Significant Labs and  Imaging:  Recent Labs  Lab 12/25/22 1852 12/25/22 1936 12/26/22 0827  WBC 14.2*  --  13.7*  HGB 10.4* 11.6* 9.6*  HCT 31.7* 34.0* 29.9*  PLT 203  --  168   Recent Labs  Lab 12/25/22 1852 12/25/22 1936 12/26/22 0827  NA 137 137 135  K 3.6 3.6  3.9  CL 92*  --  95*  CO2 26  --  22  GLUCOSE 82  --  77  BUN 37*  --  48*  CREATININE 5.02*  --  6.27*  CALCIUM 9.4  --  9.2  ALKPHOS 127*  --   --   AST 19  --   --   ALT 26  --   --   ALBUMIN 3.5  --   --     CT head: no acute intracranial abnormality CT Abdomen: R loculated small pleural effusion, no localized process in abdomen and pelvis    Results/Tests Pending at Time of Discharge:   Discharge Medications:  Allergies as of 12/26/2022       Reactions   Morphine And Codeine Itching, Other (See Comments)   Pt prefers not to be given this drug        Medication List     TAKE these medications    acetaminophen 650 MG suppository Commonly known as: TYLENOL Place 650 mg rectally every 6 (six) hours as needed for mild pain or moderate pain.   albuterol 108 (90 Base) MCG/ACT inhaler Commonly known as: VENTOLIN HFA Inhale 2 puffs into the lungs every 4 (four) hours as needed for shortness of breath.   amLODipine 5 MG tablet Commonly known as: NORVASC Take 5-10 mg by mouth See admin instructions. Take 10mg  by mouth daily every Tuesday, Thursday, Saturday and Sunday. Take 5mg  by mouth every Monday, Wednesday, and Friday.   aspirin EC 81 MG tablet Take 1 tablet (81 mg total) by mouth daily.   atorvastatin 40 MG tablet Commonly known as: Lipitor Take 1 tablet (40 mg total) by mouth daily.   Atropine Sulfate 0.01 % Soln Place 1 drop into both eyes in the morning and at bedtime.   brimonidine 0.1 % Soln Commonly known as: ALPHAGAN P Place 1 drop into the left eye in the morning and at bedtime.   calcium acetate 667 MG capsule Commonly known as: PHOSLO Take 1 capsule (667 mg total) by mouth 3 (three) times daily with meals.   capsaicin 0.025 % cream Commonly known as: ZOSTRIX Apply 1 application  topically every 8 (eight) hours as needed (pain). Apply to R & L knees, R & L shoulders, R & L wrists, R & L dorsal surfaces on hands, and upper or lower back.    cefdinir 300 MG capsule Commonly known as: OMNICEF Take 1 cap after HD on 7/12 and 1 cap after HD on 7/15   cyanocobalamin 1000 MCG tablet Take 1 tablet (1,000 mcg total) by mouth daily.   dorzolamide-timolol 2-0.5 % ophthalmic solution Commonly known as: COSOPT Place 1 drop into the left eye 2 (two) times daily.   doxycycline 100 MG tablet Commonly known as: VIBRA-TABS Take 1 tablet (100 mg total) by mouth every 12 (twelve) hours for 4 days.   famotidine 20 MG tablet Commonly known as: PEPCID Take 20 mg by mouth daily.   feeding supplement (NEPRO CARB STEADY) Liqd Take 120 mLs by mouth 2 (two) times daily.   ferrous sulfate 325 (65 FE) MG tablet Take 1 tablet (325 mg total)  by mouth daily.   guaiFENesin 100 MG/5ML liquid Commonly known as: ROBITUSSIN Take 100 mg by mouth every 4 (four) hours as needed for cough.   hydrALAZINE 25 MG tablet Commonly known as: APRESOLINE Take 25 mg by mouth 3 (three) times daily.   hyoscyamine 0.125 MG Tbdp disintergrating tablet Commonly known as: ANASPAZ Place 0.125 mg under the tongue every 4 (four) hours as needed (excess secretions).   latanoprost 0.005 % ophthalmic solution Commonly known as: XALATAN Place 1 drop into the left eye at bedtime.   losartan 100 MG tablet Commonly known as: COZAAR Take 1 tablet (100 mg total) by mouth daily.   Melatonin 10 MG Caps Take 10 mg by mouth at bedtime.   Menthol (Topical Analgesic) 5 % Gel Apply 1 application. topically every 8 (eight) hours as needed (hand/wrist pain).   metolazone 5 MG tablet Commonly known as: ZAROXOLYN Take 5 mg by mouth daily.   olopatadine 0.1 % ophthalmic solution Commonly known as: PATANOL Place 2 drops into both eyes daily.   ondansetron 4 MG tablet Commonly known as: ZOFRAN Take 4 mg by mouth every 6 (six) hours as needed for nausea or vomiting.   oxyCODONE-acetaminophen 5-325 MG tablet Commonly known as: PERCOCET/ROXICET Take 1 tablet by mouth See  admin instructions. Take 1 tablet by mouth two times a day every Tuesday, Thursday, Saturday and Sunday for pain.   OXYGEN Inhale 3 L into the lungs continuous.   polyethylene glycol 17 g packet Commonly known as: MIRALAX / GLYCOLAX Take 17 g by mouth every 12 (twelve) hours as needed for mild constipation or moderate constipation.   pramoxine-zinc acetate 1-0.1 % Lotn Commonly known as: CALADRYL Apply 1 application. topically every 8 (eight) hours as needed (back, torso, arms, and legs for puritus).   prednisoLONE acetate 1 % ophthalmic suspension Commonly known as: PRED FORTE Place 1 drop into both eyes 4 (four) times daily.   pregabalin 25 MG capsule Commonly known as: LYRICA Take 1 capsule (25 mg total) by mouth daily. What changed: when to take this   promethazine 12.5 MG tablet Commonly known as: PHENERGAN Take 12.5 mg by mouth every 6 (six) hours as needed for nausea or vomiting.   sennosides-docusate sodium 8.6-50 MG tablet Commonly known as: SENOKOT-S Take 2 tablets by mouth daily as needed for constipation.   Tears Pure 0.1-0.3 % Soln Generic drug: Dextran 70-Hypromellose Place 1 drop into both eyes in the morning, at noon, in the evening, and at bedtime.   torsemide 100 MG tablet Commonly known as: DEMADEX Take 100 mg by mouth 2 (two) times daily.   Vitamin D (Ergocalciferol) 1.25 MG (50000 UNIT) Caps capsule Commonly known as: DRISDOL Take 50,000 Units by mouth every Sunday.        Discharge Instructions: Please refer to Patient Instructions section of EMR for full details.  Patient was counseled important signs and symptoms that should prompt return to medical care, changes in medications, dietary instructions, activity restrictions, and follow up appointments.   Follow-Up Appointments:   Glendale Chard, DO 12/26/2022, 11:59 AM PGY-2, [facility]

## 2022-12-26 NOTE — Assessment & Plan Note (Signed)
Baseline debility and fatigue.  Seems to have declined over the past 5 years. -PT/OT 

## 2022-12-26 NOTE — TOC Progression Note (Signed)
Transition of Care [facility]) - Initial/Assessment Note    Patient Details  Name: Christopher Davila MRN: [id number] Date of Birth: Nov 11, 1970  Transition of Care [facility]) CM/SW Contact:    Ralene Bathe, LCSW Phone Number: 12/26/2022, 10:56 AM  Clinical Narrative:                 LCSW contacted Crystal with admissions at [facility].  The patient can discharge back to the facility when medically ready.  TOC following.    Patient Goals and CMS Choice            Expected Discharge Plan and Services                                              Prior Living Arrangements/Services                       Activities of Daily Living Home Assistive Devices/Equipment: Wheelchair ADL Screening (condition at time of admission) Patient's cognitive ability adequate to safely complete daily activities?: Yes Is the patient deaf or have difficulty hearing?: No Does the patient have difficulty seeing, even when wearing glasses/contacts?: Yes Does the patient have difficulty concentrating, remembering, or making decisions?: No Patient able to express need for assistance with ADLs?: Yes Does the patient have difficulty dressing or bathing?: Yes Independently performs ADLs?: No Communication: Independent Dressing (OT): Needs assistance Is this a change from baseline?: Pre-admission baseline Grooming: Needs assistance Is this a change from baseline?: Pre-admission baseline Feeding: Needs assistance Is this a change from baseline?: Pre-admission baseline Bathing: Needs assistance Is this a change from baseline?: Pre-admission baseline Toileting: Needs assistance Is this a change from baseline?: Pre-admission baseline In/Out Bed: Dependent Is this a change from baseline?: Pre-admission baseline Walks in Home: Dependent Is this a change from baseline?: Pre-admission baseline Does the patient have difficulty walking or climbing stairs?: Yes Weakness of Legs: Both Weakness of  Arms/Hands: None  Permission Sought/Granted                  Emotional Assessment              Admission diagnosis:  Weakness [R53.1] Right ear pain [H92.01] Generalized weakness [R53.1] Community acquired pneumonia, bilateral [J18.9] Patient Active Problem List   Diagnosis Date Noted   MRSA (methicillin resistant Staphylococcus aureus) nasal colonization 12/26/2022   Loculated pleural effusion 12/26/2022   Pneumonia 12/25/2022   Acute metabolic encephalopathy 06/13/2022   Acute encephalopathy 06/12/2022   Pleural effusion, bilateral 06/12/2022   Hyperkalemia 06/12/2022   Chronic respiratory failure with hypoxia (HCC) 06/12/2022   Generalized weakness 11/05/2021   Pain due to onychomycosis of toenails of both feet 07/31/2021   Skin lesion 07/31/2021   History of thoracentesis    Pressure injury of skin 11/06/2019   ESRD on dialysis [facility])    Heart failure with preserved ejection fraction (HCC)    Goals of care, counseling/discussion    Weakness 10/05/2019   Vision loss of left eye 09/21/2019   Homeless 09/19/2019   Anemia of chronic disease 09/19/2019   Vision loss, left eye 09/19/2019   Acute loss of vision, right 06/16/2019   Alcohol use    Gastroparesis due to DM (HCC)    Essential hypertension    Hyperlipidemia    Type 2 diabetes mellitus with hypoglycemia (HCC) 06/26/2012   PCP:  Harrison Mons,  Denny Levy, MD Pharmacy:   [facility] - [place], [place] - [address] [place] [address] Phone: [phone] Fax: [phone]     Social Determinants of Health (SDOH) Social History: SDOH Screenings   Food Insecurity: No Food Insecurity (12/26/2022)  Housing: Low Risk  (12/26/2022)  Transportation Needs: No Transportation Needs (12/26/2022)  Utilities: Not At Risk (12/26/2022)  Tobacco Use: Medium Risk (12/26/2022)   SDOH Interventions:     Readmission Risk Interventions    04/06/2021    1:01 PM  Readmission Risk  Prevention Plan  Transportation Screening Complete  Medication Review (RN Care Manager) Complete  PCP or Specialist appointment within 3-5 days of discharge Complete  HRI or Home Care Consult Complete  SW Recovery Care/Counseling Consult Complete  Palliative Care Screening Not Applicable  Skilled Nursing Facility Complete

## 2022-12-26 NOTE — Progress Notes (Signed)
Advised by CSW that pt to return to snf today. Contacted [facility] to advise clinic of pt's d/c today and pt should resume care tomorrow.   Olivia Canter Renal Navigator [phone]

## 2022-12-26 NOTE — Discharge Instructions (Addendum)
Dear Christopher Davila,  Thank you for letting us participate in your care. You were hospitalized for weakness and cough and diagnosed with Pneumonia. You were treated with antibiotics and sent home on oral antibiotics.    POST-HOSPITAL & CARE INSTRUCTIONS Finish course of antibiotics as prescribed Go to your follow up appointments (listed below)   DOCTOR'S APPOINTMENT   Future Appointments  Date Time Provider Department Center  12/31/2022  1:30 PM Helane Gunther, DPM [facility]     Take care and be well!  Family Medicine Teaching Service Inpatient Team [place]  [facility]  [address] [place], [place] [address] [phone]

## 2022-12-26 NOTE — Assessment & Plan Note (Signed)
A&O x4 on exam.  Transient alteration in mental status earlier this evening seems to have resolved. Possibly altered mentation in light of infectious process with pneumonia seen on chest x-ray and CT abdomen/pelvis, now s/p IV antibiotics. CT negative.  -Continue IV antibiotics (s/p Zosyn and vancomycin) -AM CBC, BMP -TSH

## 2022-12-26 NOTE — Plan of Care (Signed)
  Problem: Health Behavior/Discharge Planning: Goal: Ability to manage health-related needs will improve Outcome: Progressing   Problem: Clinical Measurements: Goal: Ability to maintain clinical measurements within normal limits will improve Outcome: Progressing Goal: Will remain free from infection Outcome: Progressing Goal: Diagnostic test results will improve Outcome: Progressing Goal: Respiratory complications will improve Outcome: Progressing Goal: Cardiovascular complication will be avoided Outcome: Progressing   Problem: Activity: Goal: Risk for activity intolerance will decrease Outcome: Progressing   Problem: Nutrition: Goal: Adequate nutrition will be maintained Outcome: Progressing   Problem: Coping: Goal: Level of anxiety will decrease Outcome: Progressing   Problem: Elimination: Goal: Will not experience complications related to bowel motility Outcome: Progressing Goal: Will not experience complications related to urinary retention Outcome: Progressing   Problem: Pain Managment: Goal: General experience of comfort will improve Outcome: Progressing   Problem: Safety: Goal: Ability to remain free from injury will improve Outcome: Progressing   Problem: Skin Integrity: Goal: Risk for impaired skin integrity will decrease Outcome: Progressing   Problem: Activity: Goal: Ability to tolerate increased activity will improve Outcome: Progressing   Problem: Clinical Measurements: Goal: Ability to maintain a body temperature in the normal range will improve Outcome: Progressing   Problem: Respiratory: Goal: Ability to maintain adequate ventilation will improve Outcome: Progressing Goal: Ability to maintain a clear airway will improve Outcome: Progressing   Problem: Education: Goal: Knowledge of disease and its progression will improve Outcome: Progressing Goal: Individualized Educational Video(s) Outcome: Progressing   Problem: Fluid Volume: Goal:  Compliance with measures to maintain balanced fluid volume will improve Outcome: Progressing   Problem: Health Behavior/Discharge Planning: Goal: Ability to manage health-related needs will improve Outcome: Progressing   Problem: Nutritional: Goal: Ability to make healthy dietary choices will improve Outcome: Progressing   Problem: Clinical Measurements: Goal: Complications related to the disease process, condition or treatment will be avoided or minimized Outcome: Progressing

## 2022-12-26 NOTE — Evaluation (Signed)
Occupational Therapy Evaluation Patient Details Name: Christopher Davila MRN: [id number] DOB: [birth year] Today's Date: 12/26/2022   History of Present Illness 52 y.o. male presenting to [facility] with weakness and decreased responsiveness from SNF (12/25/2022). PMH significant for: Acute on Chronic Diastolic CHF, ARF with hypoxia, Blindness, CHF, DM, ESRD, HTN, Hypoxia, Neuropathy.   Clinical Impression   Pt presents ay baseline level of function with Sup for bathing and ADL transfers with RW. All education completed and no further acute OT services are indicated at this time. OT will sign off     Recommendations for follow up therapy are one component of a multi-disciplinary discharge planning process, led by the attending physician.  Recommendations may be updated based on patient status, additional functional criteria and insurance authorization.   Assistance Recommended at Discharge Set up Supervision/Assistance  Patient can return home with the following A little help with bathing/dressing/bathroom    Functional Status Assessment  Patient has had a recent decline in their functional status and demonstrates the ability to make significant improvements in function in a reasonable and predictable amount of time.  Equipment Recommendations  None recommended by OT    Recommendations for Other Services       Precautions / Restrictions Precautions Precautions: Fall Precaution Comments: Blindness Restrictions Weight Bearing Restrictions: No      Mobility Bed Mobility               General bed mobility comments: pt seated in recliner    Transfers Overall transfer level: Needs assistance Equipment used: Rolling walker (2 wheels) Transfers: Sit to/from Stand Sit to Stand: Supervision                  Balance Overall balance assessment: Needs assistance Sitting-balance support: No upper extremity supported, Feet supported Sitting balance-Leahy Scale: Good     Standing  balance support: Reliant on assistive device for balance, During functional activity, Bilateral upper extremity supported Standing balance-Leahy Scale: Poor                             ADL either performed or assessed with clinical judgement   ADL Overall ADL's : At baseline                                       General ADL Comments: assist with bathing/showers at facility at baseline, Sup toilet and shower transfers     Vision Ability to See in Adequate Light: 3 Highly impaired (blindness) Patient Visual Report: No change from baseline       Perception     Praxis      Pertinent Vitals/Pain Pain Assessment Pain Assessment: 0-10 Pain Score: 5  Pain Location: Right Ankle Pain Descriptors / Indicators: Discomfort, Sore Pain Intervention(s): Monitored during session, Repositioned     Hand Dominance Right   Extremity/Trunk Assessment Upper Extremity Assessment Upper Extremity Assessment: Overall WFL for tasks assessed   Lower Extremity Assessment Lower Extremity Assessment: Defer to PT evaluation   Cervical / Trunk Assessment Cervical / Trunk Assessment: Normal   Communication Communication Communication: No difficulties   Cognition Arousal/Alertness: Awake/alert Behavior During Therapy: WFL for tasks assessed/performed Overall Cognitive Status: Within Functional Limits for tasks assessed  General Comments  O2 sat monitored: 5L/min provided througout session, 94% at EOB, 80% and asymptomatic post room ambulation, 91% in recliner.    Exercises     Shoulder Instructions      Home Living Family/patient expects to be discharged to:: Skilled nursing facility                                 Additional Comments: Patient aided with mobilty via wheelchair.      Prior Functioning/Environment Prior Level of Function : Needs assist             Mobility Comments:  Patient able to stand and walk; he reported able to walk to the bathroom on his own at the facility. ADLs Comments: Patient reports that only ADL that requires aide assistance is bathing/showering.        OT Problem List: Impaired balance (sitting and/or standing);Decreased activity tolerance;Impaired vision/perception      OT Treatment/Interventions:      OT Goals(Current goals can be found in the care plan section) Acute Rehab OT Goals Patient Stated Goal: go home  OT Frequency:      Co-evaluation              AM-PAC OT "6 Clicks" Daily Activity     Outcome Measure Help from another person eating meals?: None Help from another person taking care of personal grooming?: None Help from another person toileting, which includes using toliet, bedpan, or urinal?: None Help from another person bathing (including washing, rinsing, drying)?: A Little Help from another person to put on and taking off regular upper body clothing?: None Help from another person to put on and taking off regular lower body clothing?: None 6 Click Score: 23   End of Session Equipment Utilized During Treatment: Gait belt;Rolling walker (2 wheels)  Activity Tolerance: Patient tolerated treatment well Patient left: in chair;with call bell/phone within reach;with chair alarm set  OT Visit Diagnosis: Unsteadiness on feet (R26.81);Muscle weakness (generalized) (M62.81)                Time: 1191-4782 OT Time Calculation (min): 19 min Charges:  OT General Charges $OT Visit: 1 Visit OT Evaluation $OT Eval Moderate Complexity: 1 Mod    Christopher Davila 12/26/2022, 2:41 PM

## 2022-12-26 NOTE — Assessment & Plan Note (Signed)
Completed HD today. Does not appear hypervolemic, or have electrolyte derangements, or any other urgent need for repeat dialysis session tonight. -Consult to nephrology if patient remains inpatient with HD needs

## 2022-12-26 NOTE — Progress Notes (Signed)
Report given to [place].

## 2022-12-26 NOTE — Evaluation (Signed)
Physical Therapy Evaluation Patient Details Name: Christopher Davila MRN: [id number] DOB: 10/17/1970 Today's Date: 12/26/2022  History of Present Illness  52 y.o. male presenting to [facility] with weakness and decreased responsiveness from SNF (12/25/2022). PMH significant for: Acute on Chronic Diastolic CHF, ARF with hypoxia, Blindness, CHF, DM, ESRD, HTN, Hypoxia, Neuropathy.  Clinical Impression  Prior to admission, patient resided in SNF [facility]) and he reported he was independent with functional mobility and only required assistance from aide for showering. Upon evaluation, patient was received seated upright in edge of bed. Pt able to perform transfers without physical assistance from PT and RW support. Demonstrated ability to ambulate with min guard throughout room with RW and directional guidance from PT due to low vision. O2 sat monitored on 5L/min throughout session, readings as followed: 94% at rest seated EOB, 80% post amb but asymptomatic, 91% seated in recliner. Based on today's performance, PT recommend skilled rehab < 3 hours a day in order to improve functional mobility, activity tolerance and promote safety.       Assistance Recommended at Discharge None  If plan is discharge home, recommend the following:  Can travel by private vehicle  A little help with walking and/or transfers;A little help with bathing/dressing/bathroom;Assistance with cooking/housework;Direct supervision/assist for medications management;Direct supervision/assist for financial management;Assist for transportation;Help with stairs or ramp for entrance   No    Equipment Recommendations None recommended by PT  Recommendations for Other Services       Functional Status Assessment Patient has had a recent decline in their functional status and demonstrates the ability to make significant improvements in function in a reasonable and predictable amount of time.     Precautions / Restrictions  Precautions Precautions: Fall Precaution Comments: Blindness Restrictions Weight Bearing Restrictions: No      Mobility  Bed Mobility Overal bed mobility: Modified Independent             General bed mobility comments: Patient received seated upright on EOB.    Transfers Overall transfer level: Needs assistance Equipment used: Rolling walker (2 wheels) Transfers: Sit to/from Stand Sit to Stand: Supervision           General transfer comment: Close supervision provided from PT; patient able to perform STS without physical assistance from PT. Needed guidance on where RW was placed due to low vision.    Ambulation/Gait Ambulation/Gait assistance: Min guard Gait Distance (Feet): 50 Feet Assistive device: Rolling walker (2 wheels) Gait Pattern/deviations: Step-through pattern, Decreased step length - right, Decreased step length - left, Decreased stride length Gait velocity: Decreased Gait velocity interpretation: <1.8 ft/sec, indicate of risk for recurrent falls Pre-gait activities: x10 standing marching within RW. General Gait Details: Demonstrated ability to walk within room with RW and verbal cues for direction. Patient  Stairs            Wheelchair Mobility     Tilt Bed    Modified Rankin (Stroke Patients Only)       Balance Overall balance assessment: Needs assistance Sitting-balance support: No upper extremity supported, Feet supported Sitting balance-Leahy Scale: Good Sitting balance - Comments: Able to sit edge of recliner and utilize utensils to eat   Standing balance support: Reliant on assistive device for balance, During functional activity, Bilateral upper extremity supported Standing balance-Leahy Scale: Poor Standing balance comment: Relies on RW for static and dynamic activites.  Pertinent Vitals/Pain Pain Assessment Pain Assessment: 0-10 Pain Score: 10-Worst pain ever Pain Location: Right  Ankle Pain Descriptors / Indicators: Discomfort, Sore Pain Intervention(s): Limited activity within patient's tolerance, Monitored during session    Home Living Family/patient expects to be discharged to:: Skilled nursing facility                   Additional Comments: Patient aided with mobilty via wheelchair.    Prior Function Prior Level of Function : Needs assist             Mobility Comments: Patient able to stand and walk; he reported able to walk to the bathroom on his own at the facility. ADLs Comments: Patient reports that only ADL that requires aide assistance is bathing or showering.     Hand Dominance   Dominant Hand: Right    Extremity/Trunk Assessment   Upper Extremity Assessment Upper Extremity Assessment: Defer to OT evaluation    Lower Extremity Assessment Lower Extremity Assessment: Overall WFL for tasks assessed    Cervical / Trunk Assessment Cervical / Trunk Assessment: Normal  Communication   Communication: No difficulties  Cognition Arousal/Alertness: Awake/alert Behavior During Therapy: WFL for tasks assessed/performed Overall Cognitive Status: Within Functional Limits for tasks assessed                                 General Comments: A&Ox4 able to report month and year; pleasant throughout session and able to follow one step commands consistently.        General Comments General comments (skin integrity, edema, etc.): O2 sat monitored: 5L/min througout session, 94% at EOB, 80% and asymptomatic post room ambulation, 91% in recliner.    Exercises     Assessment/Plan    PT Assessment Patient needs continued PT services  PT Problem List Decreased strength;Decreased activity tolerance;Decreased balance;Decreased mobility;Decreased coordination;Decreased cognition;Decreased safety awareness;Decreased knowledge of precautions       PT Treatment Interventions DME instruction;Gait training;Stair training;Functional  mobility training;Therapeutic activities;Therapeutic exercise;Balance training;Neuromuscular re-education    PT Goals (Current goals can be found in the Care Plan section)  Acute Rehab PT Goals Patient Stated Goal: Patient would like to return to facility. PT Goal Formulation: With patient Time For Goal Achievement: 01/09/23 Potential to Achieve Goals: Good    Frequency Min 1X/week     Co-evaluation               AM-PAC PT "6 Clicks" Mobility  Outcome Measure Help needed turning from your back to your side while in a flat bed without using bedrails?: None Help needed moving from lying on your back to sitting on the side of a flat bed without using bedrails?: None Help needed moving to and from a bed to a chair (including a wheelchair)?: None Help needed standing up from a chair using your arms (e.g., wheelchair or bedside chair)?: None Help needed to walk in hospital room?: A Little Help needed climbing 3-5 steps with a railing? : A Little 6 Click Score: 22    End of Session Equipment Utilized During Treatment: Gait belt Activity Tolerance: Patient tolerated treatment well Patient left: in chair;with call bell/phone within reach;with chair alarm set Nurse Communication: Mobility status PT Visit Diagnosis: Unsteadiness on feet (R26.81);Muscle weakness (generalized) (M62.81);Difficulty in walking, not elsewhere classified (R26.2)    Time: 1610-9604 PT Time Calculation (min) (ACUTE ONLY): 25 min   Charges:   PT Evaluation $PT Eval Moderate  Complexity: 1 Mod PT Treatments $Gait Training: 8-22 mins PT General Charges $$ ACUTE PT VISIT: 1 Visit         Christene Lye, SPT Acute Rehabilitation Services [phone] Secure chat preferred    Christene Lye 12/26/2022, 1:57 PM

## 2022-12-26 NOTE — Assessment & Plan Note (Signed)
-   mupirocin in nares

## 2022-12-26 NOTE — Care Management Obs Status (Signed)
MEDICARE OBSERVATION STATUS NOTIFICATION   Patient Details  Name: Jomarion Mish MRN: [id number] Date of Birth: [birth year]   Medicare Observation Status Notification Given:  Yes    Tom-Varone, Hershal Coria, RN 12/26/2022, 12:57 PM

## 2022-12-26 NOTE — TOC Transition Note (Signed)
Transition of Care [facility]) - CM/SW Discharge Note   Patient Details  Name: Christopher Davila MRN: [id number] Date of Birth: 1971/04/10  Transition of Care [facility]) CM/SW Contact:  Ralene Bathe, LCSW Phone Number: 12/26/2022, 12:46 PM   Clinical Narrative:    Patient will DC to: [facility] Anticipated DC date: 12/26/2022 Transport by: Sharin Mons   Per MD patient ready for DC to SNF. RN to call report prior to discharge [phone] room 409). RN, patient, patient's family, and facility notified of DC. Discharge Summary sent to facility. DC packet on chart. Ambulance transport will be requested for patient.   CSW will sign off for now as social work intervention is no longer needed. Please consult [place] again if new needs arise.    Final next level of care: Long Term Nursing Home Barriers to Discharge: No Barriers Identified   Patient Goals and CMS Choice      Discharge Placement                Patient chooses bed at: [facility] Patient to be transferred to facility by: [facility] Name of family member notified: patient alert and oriented Patient and family notified of of transfer: 12/26/22  Discharge Plan and Services Additional resources added to the After Visit Summary for                                       Social Determinants of Health (SDOH) Interventions SDOH Screenings   Food Insecurity: No Food Insecurity (12/26/2022)  Housing: Low Risk  (12/26/2022)  Transportation Needs: No Transportation Needs (12/26/2022)  Utilities: Not At Risk (12/26/2022)  Tobacco Use: Medium Risk (12/26/2022)     Readmission Risk Interventions    04/06/2021    1:01 PM  Readmission Risk Prevention Plan  Transportation Screening Complete  Medication Review (RN Care Manager) Complete  PCP or Specialist appointment within 3-5 days of discharge Complete  HRI or Home Care Consult Complete  SW Recovery Care/Counseling Consult Complete  Palliative Care Screening Not Applicable  Skilled  Nursing Facility Complete

## 2022-12-26 NOTE — Assessment & Plan Note (Addendum)
Initially met SIRS criteria with tachypnea, tachycardia, febrile to 101 Fahrenheit. Visualized on chest x-ray and CT abdomen/pelvis. History of recurrent pneumonia requiring hospitalization, not within the past 30 days. -Antibiotics as above (Zosyn, vancomycin) per pharmacy  -MRSA positive, vancomycin per pharmacy

## 2022-12-27 LAB — HEMOGLOBIN A1C
Hgb A1c MFr Bld: 5.9 % — ABNORMAL HIGH (ref 4.8–5.6)
Mean Plasma Glucose: 123 mg/dL

## 2022-12-30 LAB — CULTURE, BLOOD (ROUTINE X 2)
Culture: NO GROWTH
Culture: NO GROWTH
Special Requests: ADEQUATE
Special Requests: ADEQUATE

## 2022-12-31 ENCOUNTER — Ambulatory Visit (INDEPENDENT_AMBULATORY_CARE_PROVIDER_SITE_OTHER): Payer: Medicare Other | Admitting: Podiatry

## 2022-12-31 ENCOUNTER — Encounter: Payer: Self-pay | Admitting: Podiatry

## 2022-12-31 DIAGNOSIS — E11649 Type 2 diabetes mellitus with hypoglycemia without coma: Secondary | ICD-10-CM

## 2022-12-31 DIAGNOSIS — M79674 Pain in right toe(s): Secondary | ICD-10-CM | POA: Diagnosis not present

## 2022-12-31 DIAGNOSIS — M79675 Pain in left toe(s): Secondary | ICD-10-CM | POA: Diagnosis not present

## 2022-12-31 DIAGNOSIS — B351 Tinea unguium: Secondary | ICD-10-CM

## 2022-12-31 NOTE — Progress Notes (Signed)
This patient returns to my office for at risk foot care.  This patient requires this care by a professional since this patient will be at risk due to having ESRD and type 2 DM.  This patient is unable to cut nails himself since the patient cannot reach his nails.These nails are painful walking and wearing shoes.  He presents to the office with a caregiver from his home.    This patient presents for at risk foot care today.  General Appearance  Alert, conversant and in no acute stress.  Vascular  Dorsalis pedis and posterior tibial  pulses are  not palpable  due to swelling  bilaterally.  Capillary return is within normal limits  bilaterally. Temperature is within normal limits  bilaterally.  Neurologic  Senn-Weinstein monofilament wire test within normal limits  bilaterally. Muscle power within normal limits bilaterally.  Nails Thick disfigured discolored nails with subungual debris  from hallux to fifth toes bilaterally. No evidence of bacterial infection or drainage bilaterally.  Orthopedic  No limitations of motion  feet .  No crepitus or effusions noted.  No bony pathology or digital deformities noted.  Skin  no porokeratosis noted bilaterally. Break in skin  1st interspace right foot.  No infection or drainage noted.       Onychomycosis  Pain in right toes  Pain in left toes  Skin lesion 1st interspace right foot.  Consent was obtained for treatment procedures.   Mechanical debridement of nails 1-5  bilaterally performed with a nail nipper.  Filed with dremel without incident.    Return office visit  3 months   for nail care.                 Told patient to return for periodic foot care and evaluation due to potential at risk complications.   Helane Gunther DPM

## 2023-01-19 ENCOUNTER — Emergency Department (HOSPITAL_COMMUNITY)

## 2023-01-19 ENCOUNTER — Inpatient Hospital Stay (HOSPITAL_COMMUNITY)

## 2023-01-19 ENCOUNTER — Other Ambulatory Visit: Payer: Self-pay

## 2023-01-19 ENCOUNTER — Inpatient Hospital Stay (HOSPITAL_COMMUNITY)
Admission: EM | Admit: 2023-01-19 | Discharge: 2023-01-22 | DRG: 291 | Disposition: A | Discharge status: Skilled Nursing Facility | Source: Skilled Nursing Facility | Attending: Family Medicine | Admitting: Family Medicine

## 2023-01-19 DIAGNOSIS — D631 Anemia in chronic kidney disease: Secondary | ICD-10-CM | POA: Diagnosis present

## 2023-01-19 DIAGNOSIS — R278 Other lack of coordination: Secondary | ICD-10-CM | POA: Diagnosis present

## 2023-01-19 DIAGNOSIS — E1143 Type 2 diabetes mellitus with diabetic autonomic (poly)neuropathy: Secondary | ICD-10-CM | POA: Diagnosis present

## 2023-01-19 DIAGNOSIS — E1141 Type 2 diabetes mellitus with diabetic mononeuropathy: Secondary | ICD-10-CM | POA: Diagnosis present

## 2023-01-19 DIAGNOSIS — J9601 Acute respiratory failure with hypoxia: Secondary | ICD-10-CM | POA: Diagnosis not present

## 2023-01-19 DIAGNOSIS — Z8616 Personal history of COVID-19: Secondary | ICD-10-CM | POA: Diagnosis not present

## 2023-01-19 DIAGNOSIS — M898X9 Other specified disorders of bone, unspecified site: Secondary | ICD-10-CM | POA: Diagnosis present

## 2023-01-19 DIAGNOSIS — I071 Rheumatic tricuspid insufficiency: Secondary | ICD-10-CM | POA: Diagnosis present

## 2023-01-19 DIAGNOSIS — I132 Hypertensive heart and chronic kidney disease with heart failure and with stage 5 chronic kidney disease, or end stage renal disease: Secondary | ICD-10-CM | POA: Diagnosis present

## 2023-01-19 DIAGNOSIS — R0902 Hypoxemia: Principal | ICD-10-CM

## 2023-01-19 DIAGNOSIS — Z9981 Dependence on supplemental oxygen: Secondary | ICD-10-CM | POA: Diagnosis not present

## 2023-01-19 DIAGNOSIS — I5030 Unspecified diastolic (congestive) heart failure: Secondary | ICD-10-CM | POA: Diagnosis present

## 2023-01-19 DIAGNOSIS — J90 Pleural effusion, not elsewhere classified: Secondary | ICD-10-CM | POA: Diagnosis present

## 2023-01-19 DIAGNOSIS — E1122 Type 2 diabetes mellitus with diabetic chronic kidney disease: Secondary | ICD-10-CM | POA: Diagnosis present

## 2023-01-19 DIAGNOSIS — E8770 Fluid overload, unspecified: Secondary | ICD-10-CM | POA: Diagnosis present

## 2023-01-19 DIAGNOSIS — I5032 Chronic diastolic (congestive) heart failure: Secondary | ICD-10-CM | POA: Diagnosis present

## 2023-01-19 DIAGNOSIS — Z79890 Hormone replacement therapy (postmenopausal): Secondary | ICD-10-CM

## 2023-01-19 DIAGNOSIS — R0609 Other forms of dyspnea: Secondary | ICD-10-CM

## 2023-01-19 DIAGNOSIS — K3184 Gastroparesis: Secondary | ICD-10-CM | POA: Diagnosis present

## 2023-01-19 DIAGNOSIS — N2581 Secondary hyperparathyroidism of renal origin: Secondary | ICD-10-CM | POA: Diagnosis present

## 2023-01-19 DIAGNOSIS — D72829 Elevated white blood cell count, unspecified: Secondary | ICD-10-CM | POA: Diagnosis present

## 2023-01-19 DIAGNOSIS — H543 Unqualified visual loss, both eyes: Secondary | ICD-10-CM | POA: Diagnosis present

## 2023-01-19 DIAGNOSIS — Z87891 Personal history of nicotine dependence: Secondary | ICD-10-CM

## 2023-01-19 DIAGNOSIS — G9341 Metabolic encephalopathy: Secondary | ICD-10-CM | POA: Diagnosis present

## 2023-01-19 DIAGNOSIS — J9621 Acute and chronic respiratory failure with hypoxia: Secondary | ICD-10-CM | POA: Diagnosis present

## 2023-01-19 DIAGNOSIS — Z885 Allergy status to narcotic agent status: Secondary | ICD-10-CM

## 2023-01-19 DIAGNOSIS — Z7982 Long term (current) use of aspirin: Secondary | ICD-10-CM

## 2023-01-19 DIAGNOSIS — I10 Essential (primary) hypertension: Secondary | ICD-10-CM | POA: Diagnosis present

## 2023-01-19 DIAGNOSIS — Z8701 Personal history of pneumonia (recurrent): Secondary | ICD-10-CM

## 2023-01-19 DIAGNOSIS — R5383 Other fatigue: Secondary | ICD-10-CM

## 2023-01-19 DIAGNOSIS — Z91158 Patient's noncompliance with renal dialysis for other reason: Secondary | ICD-10-CM

## 2023-01-19 DIAGNOSIS — Z992 Dependence on renal dialysis: Secondary | ICD-10-CM | POA: Diagnosis not present

## 2023-01-19 DIAGNOSIS — R451 Restlessness and agitation: Secondary | ICD-10-CM | POA: Insufficient documentation

## 2023-01-19 DIAGNOSIS — N186 End stage renal disease: Secondary | ICD-10-CM | POA: Diagnosis present

## 2023-01-19 DIAGNOSIS — G9340 Encephalopathy, unspecified: Secondary | ICD-10-CM | POA: Diagnosis present

## 2023-01-19 DIAGNOSIS — Z79899 Other long term (current) drug therapy: Secondary | ICD-10-CM | POA: Diagnosis not present

## 2023-01-19 DIAGNOSIS — Z836 Family history of other diseases of the respiratory system: Secondary | ICD-10-CM

## 2023-01-19 DIAGNOSIS — I509 Heart failure, unspecified: Secondary | ICD-10-CM

## 2023-01-19 DIAGNOSIS — Z8249 Family history of ischemic heart disease and other diseases of the circulatory system: Secondary | ICD-10-CM

## 2023-01-19 DIAGNOSIS — Z515 Encounter for palliative care: Secondary | ICD-10-CM

## 2023-01-19 DIAGNOSIS — Z833 Family history of diabetes mellitus: Secondary | ICD-10-CM

## 2023-01-19 LAB — ECHOCARDIOGRAM COMPLETE
Area-P 1/2: 2.96 cm2
Height: 65 in
S' Lateral: 2.9 cm
Weight: 2240 oz

## 2023-01-19 LAB — CBC WITH DIFFERENTIAL/PLATELET
Abs Immature Granulocytes: 0.04 10*3/uL (ref 0.00–0.07)
Basophils Absolute: 0 10*3/uL (ref 0.0–0.1)
Basophils Relative: 0 %
Eosinophils Absolute: 0.1 10*3/uL (ref 0.0–0.5)
Eosinophils Relative: 1 %
HCT: 36.9 % — ABNORMAL LOW (ref 39.0–52.0)
Hemoglobin: 11.5 g/dL — ABNORMAL LOW (ref 13.0–17.0)
Immature Granulocytes: 0 %
Lymphocytes Relative: 7 %
Lymphs Abs: 0.7 10*3/uL (ref 0.7–4.0)
MCH: 29.3 pg (ref 26.0–34.0)
MCHC: 31.2 g/dL (ref 30.0–36.0)
MCV: 93.9 fL (ref 80.0–100.0)
Monocytes Absolute: 0.6 10*3/uL (ref 0.1–1.0)
Monocytes Relative: 6 %
Neutro Abs: 9.1 10*3/uL — ABNORMAL HIGH (ref 1.7–7.7)
Neutrophils Relative %: 86 %
Platelets: 146 10*3/uL — ABNORMAL LOW (ref 150–400)
RBC: 3.93 MIL/uL — ABNORMAL LOW (ref 4.22–5.81)
RDW: 17 % — ABNORMAL HIGH (ref 11.5–15.5)
WBC: 10.7 10*3/uL — ABNORMAL HIGH (ref 4.0–10.5)
nRBC: 0 % (ref 0.0–0.2)

## 2023-01-19 LAB — CBG MONITORING, ED: Glucose-Capillary: 99 mg/dL (ref 70–99)

## 2023-01-19 LAB — COMPREHENSIVE METABOLIC PANEL
ALT: 22 U/L (ref 0–44)
AST: 20 U/L (ref 15–41)
Albumin: 3.4 g/dL — ABNORMAL LOW (ref 3.5–5.0)
Alkaline Phosphatase: 120 U/L (ref 38–126)
Anion gap: 18 — ABNORMAL HIGH (ref 5–15)
BUN: 46 mg/dL — ABNORMAL HIGH (ref 6–20)
CO2: 24 mmol/L (ref 22–32)
Calcium: 9.3 mg/dL (ref 8.9–10.3)
Chloride: 96 mmol/L — ABNORMAL LOW (ref 98–111)
Creatinine, Ser: 7.11 mg/dL — ABNORMAL HIGH (ref 0.61–1.24)
GFR, Estimated: 9 mL/min — ABNORMAL LOW (ref >60)
Glucose, Bld: 115 mg/dL — ABNORMAL HIGH (ref 70–99)
Potassium: 4 mmol/L (ref 3.5–5.1)
Sodium: 138 mmol/L (ref 135–145)
Total Bilirubin: 0.9 mg/dL (ref 0.3–1.2)
Total Protein: 7.7 g/dL (ref 6.5–8.1)

## 2023-01-19 LAB — I-STAT VENOUS BLOOD GAS, ED
Acid-Base Excess: 2 mmol/L (ref 0.0–2.0)
Bicarbonate: 25.5 mmol/L (ref 20.0–28.0)
Calcium, Ion: 1.04 mmol/L — ABNORMAL LOW (ref 1.15–1.40)
HCT: 38 % — ABNORMAL LOW (ref 39.0–52.0)
Hemoglobin: 12.9 g/dL — ABNORMAL LOW (ref 13.0–17.0)
O2 Saturation: 92 %
Potassium: 4 mmol/L (ref 3.5–5.1)
Sodium: 139 mmol/L (ref 135–145)
TCO2: 27 mmol/L (ref 22–32)
pCO2, Ven: 35.4 mmHg — ABNORMAL LOW (ref 44–60)
pH, Ven: 7.465 — ABNORMAL HIGH (ref 7.25–7.43)
pO2, Ven: 59 mmHg — ABNORMAL HIGH (ref 32–45)

## 2023-01-19 LAB — TROPONIN I (HIGH SENSITIVITY): Troponin I (High Sensitivity): 20 ng/L — ABNORMAL HIGH (ref <18)

## 2023-01-19 LAB — BRAIN NATRIURETIC PEPTIDE: B Natriuretic Peptide: 897.2 pg/mL — ABNORMAL HIGH (ref 0.0–100.0)

## 2023-01-19 LAB — AMMONIA: Ammonia: 16 umol/L (ref 9–35)

## 2023-01-19 LAB — TSH: TSH: 1.052 u[IU]/mL (ref 0.350–4.500)

## 2023-01-19 LAB — ETHANOL: Alcohol, Ethyl (B): <10 mg/dL (ref <10)

## 2023-01-19 LAB — HEPATITIS B SURFACE ANTIGEN: Hepatitis B Surface Ag: NONREACTIVE

## 2023-01-19 MED ORDER — FAMOTIDINE 20 MG PO TABS: 20.0000 mg | ORAL_TABLET | Freq: Every day | ORAL | Status: DC

## 2023-01-19 MED ORDER — MELATONIN 5 MG PO TABS
10.0000 mg | ORAL_TABLET | Freq: Every day | ORAL | Status: DC
  Administered 2023-01-21: 10 mg via ORAL
  Filled 2023-01-19: qty 2

## 2023-01-19 MED ORDER — CALCIUM ACETATE (PHOS BINDER) 667 MG PO CAPS
1334.0000 mg | ORAL_CAPSULE | Freq: Three times a day (TID) | ORAL | Status: DC
  Administered 2023-01-21 – 2023-01-22 (×3): 1334 mg via ORAL
  Filled 2023-01-19 (×5): qty 2

## 2023-01-19 MED ORDER — TORSEMIDE 20 MG PO TABS
100.0000 mg | ORAL_TABLET | Freq: Two times a day (BID) | ORAL | Status: DC
  Administered 2023-01-20 – 2023-01-22 (×4): 100 mg via ORAL
  Filled 2023-01-19 (×4): qty 5

## 2023-01-19 MED ORDER — METOLAZONE 5 MG PO TABS
5.0000 mg | ORAL_TABLET | Freq: Every day | ORAL | Status: DC
  Administered 2023-01-21 – 2023-01-22 (×2): 5 mg via ORAL
  Filled 2023-01-19 (×4): qty 1

## 2023-01-19 MED ORDER — LATANOPROST 0.005 % OP SOLN
1.0000 [drp] | Freq: Every day | OPHTHALMIC | Status: DC
  Administered 2023-01-20 – 2023-01-21 (×2): 1 [drp] via OPHTHALMIC
  Filled 2023-01-19: qty 2.5

## 2023-01-19 MED ORDER — AMLODIPINE BESYLATE 10 MG PO TABS
10.0000 mg | ORAL_TABLET | ORAL | Status: DC
  Filled 2023-01-19: qty 1

## 2023-01-19 MED ORDER — HYDRALAZINE HCL 25 MG PO TABS
25.0000 mg | ORAL_TABLET | Freq: Three times a day (TID) | ORAL | Status: DC
  Administered 2023-01-21 – 2023-01-22 (×4): 25 mg via ORAL
  Filled 2023-01-19 (×6): qty 1

## 2023-01-19 MED ORDER — ASPIRIN 81 MG PO TBEC
81.0000 mg | DELAYED_RELEASE_TABLET | Freq: Every day | ORAL | Status: DC
  Administered 2023-01-20 – 2023-01-22 (×3): 81 mg via ORAL
  Filled 2023-01-19 (×3): qty 1

## 2023-01-19 MED ORDER — DOXERCALCIFEROL 4 MCG/2ML IV SOLN
6.0000 ug | INTRAVENOUS | Status: DC
  Administered 2023-01-20 – 2023-01-22 (×2): 6 ug via INTRAVENOUS
  Filled 2023-01-19 (×5): qty 4

## 2023-01-19 MED ORDER — BRIMONIDINE TARTRATE 0.2 % OP SOLN
1.0000 [drp] | Freq: Two times a day (BID) | OPHTHALMIC | Status: DC
  Administered 2023-01-20 – 2023-01-22 (×3): 1 [drp] via OPHTHALMIC
  Filled 2023-01-19: qty 5

## 2023-01-19 MED ORDER — AMLODIPINE BESYLATE 5 MG PO TABS
5.0000 mg | ORAL_TABLET | ORAL | Status: DC
  Administered 2023-01-22: 5 mg via ORAL
  Filled 2023-01-19 (×3): qty 1

## 2023-01-19 MED ORDER — BRIMONIDINE TARTRATE-TIMOLOL 0.2-0.5 % OP SOLN
1.0000 [drp] | Freq: Two times a day (BID) | OPHTHALMIC | Status: DC
  Filled 2023-01-19: qty 5

## 2023-01-19 MED ORDER — CALCIUM ACETATE (PHOS BINDER) 667 MG PO CAPS: 667.0000 mg | ORAL_CAPSULE | Freq: Three times a day (TID) | ORAL | Status: DC

## 2023-01-19 MED ORDER — TIMOLOL MALEATE 0.5 % OP SOLN
1.0000 [drp] | Freq: Two times a day (BID) | OPHTHALMIC | Status: DC
  Administered 2023-01-20 – 2023-01-22 (×3): 1 [drp] via OPHTHALMIC
  Filled 2023-01-19: qty 5

## 2023-01-19 MED ORDER — CARMEX CLASSIC LIP BALM EX OINT
1.0000 | TOPICAL_OINTMENT | CUTANEOUS | Status: DC | PRN
  Administered 2023-01-20: 1 via TOPICAL
  Filled 2023-01-19: qty 10

## 2023-01-19 MED ORDER — LOSARTAN POTASSIUM 50 MG PO TABS
100.0000 mg | ORAL_TABLET | Freq: Every day | ORAL | Status: DC
  Administered 2023-01-21 – 2023-01-22 (×2): 100 mg via ORAL
  Filled 2023-01-19 (×3): qty 2

## 2023-01-19 MED ORDER — HEPARIN SODIUM (PORCINE) 1000 UNIT/ML IJ SOLN
INTRAMUSCULAR | Status: AC
  Filled 2023-01-19: qty 3

## 2023-01-19 MED ORDER — CHLORHEXIDINE GLUCONATE CLOTH 2 % EX PADS: 6.0000 | MEDICATED_PAD | Freq: Every day | CUTANEOUS | Status: DC

## 2023-01-19 MED ORDER — HEPARIN SODIUM (PORCINE) 5000 UNIT/ML IJ SOLN
5000.0000 [IU] | Freq: Three times a day (TID) | INTRAMUSCULAR | Status: DC
  Administered 2023-01-21: 5000 [IU] via SUBCUTANEOUS
  Filled 2023-01-19 (×3): qty 1

## 2023-01-19 MED ORDER — FUROSEMIDE 10 MG/ML IJ SOLN
80.0000 mg | Freq: Once | INTRAMUSCULAR | Status: AC
  Administered 2023-01-19: 80 mg via INTRAVENOUS
  Filled 2023-01-19: qty 8

## 2023-01-19 MED ORDER — FAMOTIDINE IN NACL 20-0.9 MG/50ML-% IV SOLN
20.0000 mg | Freq: Every day | INTRAVENOUS | Status: DC
  Administered 2023-01-20 – 2023-01-21 (×2): 20 mg via INTRAVENOUS
  Filled 2023-01-19 (×4): qty 50

## 2023-01-19 NOTE — Assessment & Plan Note (Addendum)
HD MWF, most recently Friday - nephrology consulted - Continue home phos binders

## 2023-01-19 NOTE — ED Notes (Signed)
RN adjusted pt O2 5L d/t maintaining steady pleth of 89%. Pt current at 93% on 5L

## 2023-01-19 NOTE — Assessment & Plan Note (Addendum)
Patient has history, most recent ECHO October 2022, EF 55-60%, with only mildly dilated right and left atrium. Admit BNP 890, CXR b/l pleural effusions, given IV lasix 80 mg in ED - Repeat ECHO ordered for this admission  - losartan 100 mg daily

## 2023-01-19 NOTE — Assessment & Plan Note (Addendum)
Drowsy but awakens to noxious stimuli and can answer questions appropriately. Unclear etiology. He was hypoxic on arrival but not to the degree I would expect to cause these symptoms. Perhaps this is metabolic? Could he have had incomplete dialysis on Friday causing volume overload and metabolic encephalopathy? VBG without evidence of hypercarbia. Looking back at past admissions it does seem that he is frequently encephalopathic on arrival but improves with treatment of his underlying condition. Given that he has a non-focal exam aside from excessive drowsiness, and by ED report was able to answer orientation quesitons, do not see an indication for CT At this time.  - Ammonia, TSH - Monitor mental status post-HD. Hopefully we get rapid improvement here

## 2023-01-19 NOTE — ED Triage Notes (Signed)
Pt from [employer]. Staff state pt lethargic and were having difficulty arousing pt. Pt a and o x 4. Hypertensive. 3Lnc baseline. Pt was84% on 3lo2 on scene. Pt blind.diaylsis m/w/f.

## 2023-01-19 NOTE — H&P (Addendum)
Hospital Admission History and Physical Service Pager: [phone]  Patient name: Christopher Davila Medical record number: [id number] Date of Birth: 03/07/1971 Age: 52 y.o. Gender: male  Primary Care Provider: Karna Dupes, MD Consultants: Nephrology  Code Status: Full Code patient was somnolent upon evaluation, he has been Full Code for all prior admissions  Preferred Emergency Contact: Ashley Royalty, [phone]   Chief Complaint: Lethargy  Assessment and Plan: Christopher Davila is a 52 y.o. male presenting with increased somnolence and encephalopathy. Differential for presentation of this includes uremia in the setting of ESRD, CHF exacerbation and hypoxia. Lethargy and altered mental status presumed to be metabolic and we are holding potentially sedating medications. Will get dialysis with nephrology today. Will CT if mental status worsens.   -      Hospital     * (Principal) Acute hypoxic respiratory failure (HCC)     On 5L [place], up from 3L documented at home. Appears to be grossly volume  overloaded with 3+ pitting edema to BLE. Has both ESRD and CHF, no recent  echo available. Makes urine, so given 80mg  IV Lasix in the ED. Is on  torsemide 100mg  BID at his SNF - Progressive floor, Inpatient status - Have consulted nephrology for HD as I doubt we'll be able to adequately  diurese him with just loop diuretics, appreciate their assistance  - Troponin and Echo have been ordered. No ischemic changes appreciated on  EKG - Monitor UOP and redose loop diuretic as able. IV for now until his  mental status improves - Strict I/O and daily weights  - Given GCS of 9 with increasing O2 requirement, will ask CCM to evaluate.  He is high risk for needing a higher level of care         Essential hypertension     Elevated BP on admission. 150s/90s most recently. Perhaps volume  overload is contributing here?  - Diuresis and HD as above   - Home amlodipine 5mg  on HD days, 10mg   on non-HD days - Home Losartan 100mg  daily - Home hydral 25mg  TID - Home metolazone 5mg  daily          ESRD on dialysis [facility])     HD MWF, most recently Friday - nephrology consulted - Continue home phos binders         Heart failure with preserved ejection fraction [facility])     Patient has history, most recent ECHO October 2022, EF 55-60%, with  only mildly dilated right and left atrium. Admit BNP 890, CXR b/l pleural  effusions, given IV lasix 80 mg in ED - Repeat ECHO ordered for this admission  - losartan 100 mg daily          Acute encephalopathy     Drowsy but awakens to noxious stimuli and can answer questions  appropriately. Unclear etiology. He was hypoxic on arrival but not to the  degree I would expect to cause these symptoms. Perhaps this is metabolic?  Could he have had incomplete dialysis on Friday causing volume overload  and metabolic encephalopathy? VBG without evidence of hypercarbia. Looking  back at past admissions it does seem that he is frequently encephalopathic  on arrival but improves with treatment of his underlying condition.  - CT head has been ordered - GCS 9, will ask CCM to evaluate, high risk for needing higher level of  care - Ammonia, TSH - Monitor mental status post-HD. Hopefully we get rapid improvement here  -  Holding pregabalin and narcotics         Loculated pleural effusion     FEN/GI: Heart healthy diet VTE Prophylaxis: Heparin  Disposition: requires inpatient treatment   History of Present Illness:  Christopher Davila is a 52 y.o. male with a PMHx of ESRD  on MWF HD, HTN, diabetes, CHF and chronic hypoxic respiratory failure on 3L on nasal cannula presenting with altered mental status. Patient was at skilled nursing facility where he had increased somnolence. O2 sats were checked and was hypoxic in the 80s. At that time, he was 3-4L oxygen via nasal canula and they called the EMS.   Upon evaluation, the patient continues to be  somnolent. Unresponsive to verbal stimuli and required sternal rub and responsive to pain. Unable to answer questions afterwards and went back to sleep.     In the ED, labs were collected notable for a BNP of 890 and chest x-ray with bilateral pleural effusions R>L. He still makes urine and was given 80 mg of lasix IV.   Review Of Systems: Per HPI with the following additions:   Pertinent Past Medical History: ESRD on HD MWF CHF w/ preserved EF fraction 55-60% HTN  DM  Chronic hypoxic respiratory failure on 3-4L   Remainder reviewed in history tab.   Pertinent Past Surgical History: None reported    Remainder reviewed in history tab.  Pertinent Social History: Tobacco use: None reported  Alcohol use: Former significant alcohol use per chart reivew Other Substance use: None reported Lives at skilled nursing facility, [facility] in [place], [place]   Pertinent Family History: None reported  Remainder reviewed in history tab.   Important Outpatient Medications: - amlodipine 5 mg on HD days, 10mg  on non-HD days - hydralazine 25 mg TID  - losartan 100 mg daily  -Metolazone 5 mg daily  - Torsemide 100 mg  - Clonidine 0.1 mg BID PRN - Melatonin 10 mg nightly - Pregabalin 25 mg (2 capsules) twice daily (held inpatient)  - Senna daily - Vitamin B-12 - Vitamin D - Oxycodone-acetaminophen 5-325 every 6 hours as needed (held inpatient) - Oxycodone-acetaminophen 5-325 twice daily scheduled  (held inpatient)  Remainder reviewed in medication history.   Objective: BP (!) 155/90   Pulse 80   Temp 98.7 F (37.1 C) (Oral)   Resp 12   Ht 5\' 5"  (1.651 m)   Wt 63.5 kg   SpO2 91%   BMI 23.30 kg/m  Physical Exam HENT:     Head: Normocephalic.     Nose: Nose normal.  Eyes:     Comments: Blind - opacified corneas   Cardiovascular:     Rate and Rhythm: Normal rate and regular rhythm.     Pulses: Normal pulses.  Pulmonary:     Breath sounds: No wheezing.     Comments: RR  12-14, requiring 5L O2 to maintain sats Abdominal:     General: Bowel sounds are normal. There is distension.  Musculoskeletal:        General: Swelling present.     Right lower leg: Edema (3+) present.     Left lower leg: Edema (3+) present.  Skin:    General: Skin is warm.  Neurological:     Mental Status: He is lethargic and disoriented.     GCS: GCS eye subscore is 2. GCS verbal subscore is 3. GCS motor subscore is 4.     Comments: Requires deep sternal rub to awaken, quickly falls back asleep  Labs:  CBC BMET  Recent Labs  Lab 01/19/23 0741 01/19/23 0747  WBC 10.7*  --   HGB 11.5* 12.9*  HCT 36.9* 38.0*  PLT 146*  --    Recent Labs  Lab 01/19/23 0741 01/19/23 0747  NA 138 139  K 4.0 4.0  CL 96*  --   CO2 24  --   BUN 46*  --   CREATININE 7.11*  --   GLUCOSE 115*  --   CALCIUM 9.3  --     Pertinent additional labs BNP 890 .  EKG:  Sinus rhythym, rate 89, no ST elevation/depression or TWI.   Imaging Studies Performed:  Imaging Study (ie. Chest x-ray) Impression from Radiologist: CHF pattern including pleural effusions.    My Interpretation: Diffuse haziness, Pleural effusions bilaterally R>L    Peterson Ao MD 01/19/2023, 12:41 PM PGY-1, [facility] Health Psychiatry Resident   FPTS Intern pager: [phone], text pages welcome Secure chat group [facility] Teaching Service     I have evaluated this patient along with Dr. Birdena Crandall and reviewed the above note, making necessary revisions.  Dorothyann Gibbs, MD 01/19/2023, 12:41 PM PGY-3, [facility] Health Family Medicine

## 2023-01-19 NOTE — ED Provider Notes (Signed)
[place] EMERGENCY DEPARTMENT AT [facility] Provider Note   CSN: [id number] Arrival date & time: 01/19/23  4098     History  Chief Complaint  Patient presents with   Hypertension    Field Christopher Davila is a 52 y.o. male.  Patient is a 52 year old male with a past medical history of ESRD on MWF HD, hypertension, diabetes, CHF, chronic hypoxic respiratory failure on 3 L nasal cannula presenting to the emergency department with altered mental status.  Per EMS, the patient was drowsy and hard to arouse this morning at the nursing home.  He was noted to be hypoxic to the 80s on his nasal cannula and so they called 911.  Patient denies any chest pain or shortness of breath, fever or cough.  Further history is limited due to patient's mental status.  The history is provided by the patient, the EMS personnel and medical records. The history is limited by the condition of the patient.  Hypertension       Home Medications Prior to Admission medications   Medication Sig Start Date End Date Taking? Authorizing Provider  acetaminophen (TYLENOL) 650 MG suppository Place 650 mg rectally every 6 (six) hours as needed for mild pain or moderate pain.    [provider]  albuterol (VENTOLIN HFA) 108 (90 Base) MCG/ACT inhaler Inhale 2 puffs into the lungs every 4 (four) hours as needed for shortness of breath.    [provider]  amLODipine (NORVASC) 5 MG tablet Take 5-10 mg by mouth See admin instructions. Take 10mg  by mouth daily every Tuesday, Thursday, Saturday and Sunday. Take 5mg  by mouth every Monday, Wednesday, and Friday. 05/10/22   [provider]  aspirin EC 81 MG tablet Take 1 tablet (81 mg total) by mouth daily. 05/24/15   Ghimire, Werner Lean, MD  atorvastatin (LIPITOR) 40 MG tablet Take 1 tablet (40 mg total) by mouth daily. 12/05/20   [place], Peter M, MD  Atropine Sulfate 0.01 % SOLN Place 1 drop into both eyes in the morning and at bedtime. 04/24/21    [provider]  brimonidine (ALPHAGAN P) 0.1 % SOLN Place 1 drop into the left eye in the morning and at bedtime.    [provider]  calcium acetate (PHOSLO) 667 MG capsule Take 1 capsule (667 mg total) by mouth 3 (three) times daily with meals. 11/19/19   Marinda Elk, MD  capsaicin (ZOSTRIX) 0.025 % cream Apply 1 application  topically every 8 (eight) hours as needed (pain). Apply to R & L knees, R & L shoulders, R & L wrists, R & L dorsal surfaces on hands, and upper or lower back.    [provider]  cefdinir (OMNICEF) 300 MG capsule Take 1 cap after HD on 7/12 and 1 cap after HD on 7/15 12/26/22   Glendale Chard, DO  Dextran 70-Hypromellose (TEARS PURE) 0.1-0.3 % SOLN Place 1 drop into both eyes in the morning, at noon, in the evening, and at bedtime.    [provider]  dorzolamide-timolol (COSOPT) 22.3-6.8 MG/ML ophthalmic solution Place 1 drop into the left eye 2 (two) times daily. 09/23/19   Joseph Art, DO  famotidine (PEPCID) 20 MG tablet Take 20 mg by mouth daily. 05/10/22   [provider]  ferrous sulfate 325 (65 FE) MG tablet Take 1 tablet (325 mg total) by mouth daily. 06/15/22 07/15/22  Lanae Boast, MD  guaiFENesin (ROBITUSSIN) 100 MG/5ML liquid Take 100 mg by mouth every 4 (  four) hours as needed for cough.    [provider]  hydrALAZINE (APRESOLINE) 25 MG tablet Take 25 mg by mouth 3 (three) times daily.    [provider]  hyoscyamine (ANASPAZ) 0.125 MG TBDP disintergrating tablet Place 0.125 mg under the tongue every 4 (four) hours as needed (excess secretions).    [provider]  latanoprost (XALATAN) 0.005 % ophthalmic solution Place 1 drop into the left eye at bedtime. 09/23/19   Joseph Art, DO  losartan (COZAAR) 100 MG tablet Take 1 tablet (100 mg total) by mouth daily. 11/19/19   Marinda Elk, MD  Melatonin 10 MG CAPS Take 10 mg by mouth at bedtime.    [provider]  Menthol,  Topical Analgesic, 5 % GEL Apply 1 application. topically every 8 (eight) hours as needed (hand/wrist pain).    [provider]  metolazone (ZAROXOLYN) 5 MG tablet Take 5 mg by mouth daily. 05/10/22   [provider]  Nutritional Supplements (FEEDING SUPPLEMENT, NEPRO CARB STEADY,) LIQD Take 120 mLs by mouth 2 (two) times daily.    [provider]  olopatadine (PATANOL) 0.1 % ophthalmic solution Place 2 drops into both eyes daily.    [provider]  ondansetron (ZOFRAN) 4 MG tablet Take 4 mg by mouth every 6 (six) hours as needed for nausea or vomiting.    [provider]  oxyCODONE-acetaminophen (PERCOCET/ROXICET) 5-325 MG tablet Take 1 tablet by mouth See admin instructions. Take 1 tablet by mouth two times a day every Tuesday, Thursday, Saturday and Sunday for pain. 06/04/22   [provider]  OXYGEN Inhale 3 L into the lungs continuous.    [provider]  polyethylene glycol (MIRALAX / GLYCOLAX) 17 g packet Take 17 g by mouth every 12 (twelve) hours as needed for mild constipation or moderate constipation.    [provider]  pramoxine-zinc acetate (CALADRYL) 1-0.1 % LOTN Apply 1 application. topically every 8 (eight) hours as needed (back, torso, arms, and legs for puritus).    [provider]  prednisoLONE acetate (PRED FORTE) 1 % ophthalmic suspension Place 1 drop into both eyes 4 (four) times daily.    [provider]  pregabalin (LYRICA) 25 MG capsule Take 1 capsule (25 mg total) by mouth daily. Patient taking differently: Take 25 mg by mouth 2 (two) times daily. 11/07/21   Narda Bonds, MD  promethazine (PHENERGAN) 12.5 MG tablet Take 12.5 mg by mouth every 6 (six) hours as needed for nausea or vomiting.    [provider]  sennosides-docusate sodium (SENOKOT-S) 8.6-50 MG tablet Take 2 tablets by mouth daily as needed for constipation.    [provider]  torsemide (DEMADEX) 100 MG  tablet Take 100 mg by mouth 2 (two) times daily.    [provider]  vitamin B-12 1000 MCG tablet Take 1 tablet (1,000 mcg total) by mouth daily. 05/11/21   Rhetta Mura, MD  Vitamin D, Ergocalciferol, (DRISDOL) 1.25 MG (50000 UNIT) CAPS capsule Take 50,000 Units by mouth every Sunday.    [provider]      Allergies    Morphine and codeine    Review of Systems   Review of Systems  Physical Exam Updated Vital Signs BP (!) 144/88   Pulse 84   Temp 98.7 F (37.1 C) (Oral)   Resp 13   Ht 5\' 5"  (1.651 m)   Wt 63.5 kg   SpO2 96%   BMI 23.30 kg/m  Physical  Exam Vitals and nursing note reviewed.  Constitutional:      Comments: Drowsy, arousable to noxious stimuli but quickly falls back asleep  HENT:     Head: Normocephalic and atraumatic.     Nose: Nose normal.     Mouth/Throat:     Mouth: Mucous membranes are moist.     Pharynx: Oropharynx is clear.  Eyes:     Extraocular Movements: Extraocular movements intact.  Cardiovascular:     Rate and Rhythm: Normal rate and regular rhythm.     Heart sounds: Normal heart sounds.  Pulmonary:     Effort: Pulmonary effort is normal.     Breath sounds: Normal breath sounds.  Abdominal:     General: Abdomen is flat.     Palpations: Abdomen is soft.     Tenderness: There is no abdominal tenderness.  Musculoskeletal:        General: Normal range of motion.     Cervical back: Normal range of motion.     Right lower leg: Edema (3+ to knees) present.     Left lower leg: Edema (3+ to knees) present.  Skin:    General: Skin is warm and dry.  Neurological:     General: No focal deficit present.     Mental Status: He is oriented to person, place, and time.  Psychiatric:        Mood and Affect: Mood normal.     ED Results / Procedures / Treatments   Labs (all labs ordered are listed, but only abnormal results are displayed) Labs Reviewed  COMPREHENSIVE METABOLIC PANEL - Abnormal; Notable for the following  components:      Result Value   Chloride 96 (*)    Glucose, Bld 115 (*)    BUN 46 (*)    Creatinine, Ser 7.11 (*)    Albumin 3.4 (*)    GFR, Estimated 9 (*)    Anion gap 18 (*)    All other components within normal limits  CBC WITH DIFFERENTIAL/PLATELET - Abnormal; Notable for the following components:   WBC 10.7 (*)    RBC 3.93 (*)    Hemoglobin 11.5 (*)    HCT 36.9 (*)    RDW 17.0 (*)    Platelets 146 (*)    Neutro Abs 9.1 (*)    All other components within normal limits  BRAIN NATRIURETIC PEPTIDE - Abnormal; Notable for the following components:   B Natriuretic Peptide 897.2 (*)    All other components within normal limits  I-STAT VENOUS BLOOD GAS, ED - Abnormal; Notable for the following components:   pH, Ven 7.465 (*)    pCO2, Ven 35.4 (*)    pO2, Ven 59 (*)    Calcium, Ion 1.04 (*)    HCT 38.0 (*)    Hemoglobin 12.9 (*)    All other components within normal limits  ETHANOL  CBG MONITORING, ED    EKG EKG Interpretation Date/Time:  Sunday January 19 2023 07:41:00 EDT Ventricular Rate:  89 PR Interval:  173 QRS Duration:  103 QT Interval:  404 QTC Calculation: 492 R Axis:   124  Text Interpretation: Sinus rhythm Atrial premature complex Probable left atrial enlargement Left posterior fascicular block Borderline prolonged QT interval No significant change since last tracing Confirmed by Elayne Snare ([id number]) on 01/19/2023 8:05:43 AM  Radiology DG Chest Port 1 View  Result Date: 01/19/2023 CLINICAL DATA:  Hypoxia EXAM: PORTABLE CHEST 1 VIEW COMPARISON:  12/25/2022 FINDINGS: Right more than left pleural  effusion with diffuse hazy appearance of the chest. Chronic cardiomegaly with vascular pedicle widening. IMPRESSION: CHF pattern including pleural effusions. Electronically Signed   By: Tiburcio Pea M.D.   On: 01/19/2023 08:08    Procedures Procedures    Medications Ordered in ED Medications  furosemide (LASIX) injection 80 mg (has no administration in time  range)    ED Course/ Medical Decision Making/ A&P Clinical Course as of 01/19/23 0918  [facility] Jan 19, 2023  1308 CXR concerning for CHF with bilateral pleural effusions. [VK]  [id number] Patient reports he does still make urine and will be given IV lasix. Did desat when O2 was attempted to be weaned. Will be admitted for volume overload. [VK]    Clinical Course User Index [VK] Rexford Maus, DO                                 Medical Decision Making This patient presents to the ED with chief complaint(s) of altered mental status, hypoxia with pertinent past medical history of ESRD on MWF HD, hypertension, diabetes, CHF, hypoxic respiratory failure on 3 L nasal cannula which further complicates the presenting complaint. The complaint involves an extensive differential diagnosis and also carries with it a high risk of complications and morbidity.    The differential diagnosis includes pneumonia, pneumothorax, pulmonary edema, pleural effusion, arrhythmia, anemia, CHF exacerbation, hypercarbia, electrolyte abnormality, intoxication, infection  Additional history obtained: Additional history obtained from EMS  and nursing home/care facility Records reviewed previous admission documents and Nursing Home Documents  ED Course and Reassessment: On patient's arrival to the emergency department he was hypoxic and placed on 6 L nasal cannula.  I initially tried to titrate down to 4 L and patient desatted to 91 to 92% and was placed back on 5.  Who is mildly hypertensive on arrival.  The patient is drowsy but is arousable to verbal stimulation and is oriented though quickly falls back asleep.  Patient will have labs, EKG and chest x-ray to evaluate for cause of his symptoms and he will be closely reassessed.  Independent labs interpretation:  The following labs were independently interpreted: Elevated BNP, mild leukocytosis, labs otherwise at patient's baseline  Independent visualization of  imaging: - I independently visualized the following imaging with scope of interpretation limited to determining acute life threatening conditions related to emergency care: Chest x-ray, which revealed pulmonary edema  Consultation: - Consulted or discussed management/test interpretation w/ external professional: Family medicine  Consideration for admission or further workup: Requires admission for his hypoxia in the setting of volume overload Social Determinants of health: N/A    Amount and/or Complexity of Data Reviewed Labs: ordered. Radiology: ordered.  Risk Prescription drug management. Decision regarding hospitalization.          Final Clinical Impression(s) / ED Diagnoses Final diagnoses:  Hypoxia  Acute on chronic congestive heart failure, unspecified heart failure type [facility])  Lethargy    Rx / DC Orders ED Discharge Orders     None         Rexford Maus, DO 01/19/23 [phone]

## 2023-01-19 NOTE — Consult Note (Addendum)
[facility] Renal Consultation Note    Indication for Consultation:  Management of ESRD/hemodialysis; anemia, hypertension/volume and secondary hyperparathyroidism PCP: Dr. Blenda Mounts Nephrologist: Dr Signe Colt  HPI: Christopher Davila is a 52 y.o. male with ESRD on hemodialysis MWF at [facility]. PMH: DMT2, HFpEF G2DD, HTN, medical noncompliance. Last HD 01/15/2023. Signed off 1 hour early, left HD 9 kgs above OP EDW. H/O truncated and missed HD.   Patient presented to ED this AM with increased somnolence, AMS. CXR CHF pattern including pleural effusions. SCr 7.11 BUN 46 CO2 24 K+ 4.0 WBC 10.7 HGB 11.5 PLT 146 AGAS-pH 7.46 CO2 35.4 O2 59. He was started on high flow O2 in ED.   Seen in ED. RR 12, he is unresponsive to verbal stimuli, does wake up briefly to sternal rub. Knows his name, says he is at gas station. He has fasciculations of UE-mild asterixis but BUN is not elevated.  Called primary who is at bedside. Concerned that he is not protecting his airway, very lethargic. PCM has been called. He has generalized anasarca-HD orders have been entered. He has been admitted for AMS/Acute hypoxic respiratory failure.  He will require serial HD for volume removal.   Past Medical History:  Diagnosis Date   Acute on chronic diastolic CHF (congestive heart failure) (HCC) 09/27/2019   Acute respiratory failure with hypoxia (HCC) 03/26/2021   Acute urinary retention 10/13/2019   Anemia    Blindness 09/04/2019   CHF (congestive heart failure) (HCC)    COVID-19    Diabetes mellitus    Diarrhea 10/14/2019   Epigastric abdominal pain    ESRD (end stage renal disease) (HCC)    Gastroparesis due to DM (HCC)    Hypertension    Hypertensive urgency 04/17/2019   Hypervolemia    Hypoalbuminemia 04/17/2019   Hypoxia    Metabolic acidosis, increased anion gap 10/13/2019   Multifocal pneumonia 05/06/2021   Neuropathy of lower extremity    Oral candidiasis 06/26/2012    Palliative care encounter    Pancreatitis 06/26/2012   Weight loss 06/26/2012   Past Surgical History:  Procedure Laterality Date   AV FISTULA PLACEMENT Right 10/29/2019   Procedure: RIGHT ARM BRACHIOBASILIC ARTERIOVENOUS (AV) FISTULA CREATION;  Surgeon: Larina Earthly, MD;  Location: [facility] OR;  Service: Vascular;  Laterality: Right;   BASCILIC VEIN TRANSPOSITION Right 01/05/2020   Procedure: RIGHT ARM SECOND STAGE BASCILIC VEIN TRANSPOSITION;  Surgeon: Larina Earthly, MD;  Location: [facility] OR;  Service: Vascular;  Laterality: Right;   EYE SURGERY     IR FLUORO GUIDE CV LINE RIGHT  10/28/2019   IR US GUIDE VASC ACCESS RIGHT  10/28/2019   Family History  Problem Relation Age of Onset   Diabetes Mother    Lung disease Mother    Hypertension Mother    Diabetes Maternal Aunt    CAD Maternal Aunt    CAD Cousin    Social History:  reports that he quit smoking about 7 years ago. His smoking use included cigarettes. He has never used smokeless tobacco. He reports that he does not currently use alcohol. He reports that he does not use drugs. Allergies  Allergen Reactions   Morphine Itching    Pt prefers not to be given this drug   Morphine And Codeine Itching and Other (See Comments)    Pt prefers not to be given this drug   Prior to Admission medications   Medication Sig Start Date End Date Taking? Authorizing  Provider  acetaminophen (TYLENOL) 650 MG suppository Place 650 mg rectally every 6 (six) hours as needed for mild pain or moderate pain.   Yes [provider]  albuterol (VENTOLIN HFA) 108 (90 Base) MCG/ACT inhaler Inhale 2 puffs into the lungs every 4 (four) hours as needed for shortness of breath.   Yes [provider]  amLODipine (NORVASC) 5 MG tablet Take 10 mg by mouth See admin instructions. Give 10mg  by mouth one time a day every Tuesday, Thursday, Saturday, Sunday for htn. 05/10/22  Yes [provider]  amLODipine (NORVASC) 5 MG tablet Take 5 mg by mouth  See admin instructions. Give 5mg  by mouth one time a day every Monday, Wednesday, Friday for Hypertension.   Yes [provider]  aspirin EC 81 MG tablet Take 1 tablet (81 mg total) by mouth daily. 05/24/15  Yes Ghimire, Werner Lean, MD  atorvastatin (LIPITOR) 40 MG tablet Take 1 tablet (40 mg total) by mouth daily. 12/05/20  Yes [place], Peter M, MD  brimonidine-timolol (COMBIGAN) 0.2-0.5 % ophthalmic solution Place 1 drop into both eyes in the morning and at bedtime.   Yes [provider]  calcium acetate (PHOSLO) 667 MG capsule Take 1 capsule (667 mg total) by mouth 3 (three) times daily with meals. 11/19/19  Yes Marinda Elk, MD  capsaicin (ZOSTRIX) 0.025 % cream Apply 1 application  topically every 8 (eight) hours as needed (pain). Apply to R & L knees, R & L shoulders, R & L wrists, R & L dorsal surfaces on hands, and upper or lower back.   Yes [provider]  Cholecalciferol 1.25 MG (50000 UT) TABS Take 50,000 Units by mouth once a week. Sundays.   Yes [provider]  cloNIDine (CATAPRES) 0.1 MG tablet Take 0.1 mg by mouth every 12 (twelve) hours as needed (Hypertension). Give for SBP greater than 160 and DBP greater than 100 check BP after administration.   Yes [provider]  famotidine (PEPCID) 20 MG tablet Take 20 mg by mouth daily. 05/10/22  Yes [provider]  guaiFENesin (ROBITUSSIN) 100 MG/5ML liquid Take 100 mg by mouth every 4 (four) hours as needed for cough.   Yes [provider]  hydrALAZINE (APRESOLINE) 25 MG tablet Take 25 mg by mouth 3 (three) times daily.   Yes [provider]  hyoscyamine (ANASPAZ) 0.125 MG TBDP disintergrating tablet Place 0.125 mg under the tongue every 4 (four) hours as needed (excess secretions). Not to exceed 6 doses in 24 hours.   Yes [provider]  latanoprost (XALATAN) 0.005 % ophthalmic solution Place 1 drop into the left eye at bedtime. Patient taking  differently: Place 1 drop into both eyes at bedtime. 09/23/19  Yes Vann, Jessica U, DO  losartan (COZAAR) 100 MG tablet Take 1 tablet (100 mg total) by mouth daily. 11/19/19  Yes Marinda Elk, MD  Melatonin 10 MG TABS Take 10 mg by mouth at bedtime.   Yes [provider]  Menthol, Topical Analgesic, 5 % GEL Apply 1 application. topically every 8 (eight) hours as needed (hand/wrist pain).   Yes [provider]  metolazone (ZAROXOLYN) 5 MG tablet Take 5 mg by mouth daily. 05/10/22  Yes [provider]  ondansetron (ZOFRAN) 4 MG tablet Take 4 mg by mouth every 6 (six) hours as needed for nausea or vomiting.   Yes [provider]  oxyCODONE-acetaminophen (PERCOCET/ROXICET) 5-325 MG tablet Take 1 tablet by mouth every 6 (six) hours as  needed for moderate pain or severe pain (SHOB). 06/04/22  Yes [provider]  polyethylene glycol (MIRALAX / GLYCOLAX) 17 g packet Take 17 g by mouth every 12 (twelve) hours as needed for mild constipation or moderate constipation.   Yes [provider]  pramoxine-zinc acetate (CALADRYL) 1-0.1 % LOTN Apply 1 application. topically every 8 (eight) hours as needed (back, torso, arms, and legs for puritus).   Yes [provider]  pregabalin (LYRICA) 25 MG capsule Take 1 capsule (25 mg total) by mouth daily. Patient taking differently: Take 50 mg by mouth 2 (two) times daily. 11/07/21  Yes Narda Bonds, MD  promethazine (PHENERGAN) 12.5 MG tablet Take 12.5 mg by mouth every 6 (six) hours as needed for nausea or vomiting. Not to exceed 4 doses in 24 hours.   Yes [provider]  sennosides-docusate sodium (SENOKOT-S) 8.6-50 MG tablet Take 2 tablets by mouth See admin instructions. Give 2 tablets by mouth one time a day every 3 days for constipation.   Yes [provider]  sennosides-docusate sodium (SENOKOT-S) 8.6-50 MG tablet Take 2 tablets by mouth in the morning and at bedtime.   Yes [provider]  torsemide (DEMADEX) 100 MG tablet Take 100 mg by mouth 2 (two) times daily.   Yes [provider]  Nutritional Supplements (FEEDING SUPPLEMENT, NEPRO CARB STEADY,) LIQD Take 120 mLs by mouth 2 (two) times daily.    [provider]  OXYGEN Inhale 3 L into the lungs continuous.    [provider]   Current Facility-Administered Medications  Medication Dose Route Frequency Provider Last Rate Last Admin   [START ON 01/21/2023] amLODipine (NORVASC) tablet 10 mg  10 mg Oral Once per day on Sunday Tuesday Thursday Saturday Alicia Amel, MD       [START ON 01/20/2023] amLODipine (NORVASC) tablet 5 mg  5 mg Oral Once per day on Monday Wednesday Friday Alicia Amel, MD       aspirin EC tablet 81 mg  81 mg Oral Daily Alicia Amel, MD       brimonidine-timolol (COMBIGAN) 0.2-0.5 % ophthalmic solution 1 drop  1 drop Both Eyes Q12H Alicia Amel, MD       calcium acetate (PHOSLO) capsule 667 mg  667 mg Oral TID WC Alicia Amel, MD       [START ON 01/20/2023] Chlorhexidine Gluconate Cloth 2 % PADS 6 each  6 each Topical Q0600 Pola Corn, NP       famotidine (PEPCID) tablet 20 mg  20 mg Oral Daily Alicia Amel, MD       [START ON 01/20/2023] heparin injection 5,000 Units  5,000 Units Subcutaneous Q8H Alicia Amel, MD       hydrALAZINE (APRESOLINE) tablet 25 mg  25 mg Oral TID Alicia Amel, MD       latanoprost (XALATAN) 0.005 % ophthalmic solution 1 drop  1 drop Both Eyes QHS Alicia Amel, MD       losartan (COZAAR) tablet 100 mg  100 mg Oral Daily Alicia Amel, MD       melatonin tablet 10 mg  10 mg Oral QHS Alicia Amel, MD       metolazone (ZAROXOLYN) tablet 5 mg  5 mg Oral Daily Alicia Amel, MD       torsemide [facility]) tablet 100 mg  100 mg Oral BID Alicia Amel, MD       Current Outpatient  Medications  Medication Sig Dispense Refill   acetaminophen (TYLENOL) 650 MG suppository Place 650 mg rectally every 6  (six) hours as needed for mild pain or moderate pain.     albuterol (VENTOLIN HFA) 108 (90 Base) MCG/ACT inhaler Inhale 2 puffs into the lungs every 4 (four) hours as needed for shortness of breath.     amLODipine (NORVASC) 5 MG tablet Take 10 mg by mouth See admin instructions. Give 10mg  by mouth one time a day every Tuesday, Thursday, Saturday, Sunday for htn.     amLODipine (NORVASC) 5 MG tablet Take 5 mg by mouth See admin instructions. Give 5mg  by mouth one time a day every Monday, Wednesday, Friday for Hypertension.     aspirin EC 81 MG tablet Take 1 tablet (81 mg total) by mouth daily.     atorvastatin (LIPITOR) 40 MG tablet Take 1 tablet (40 mg total) by mouth daily. 90 tablet 3   brimonidine-timolol (COMBIGAN) 0.2-0.5 % ophthalmic solution Place 1 drop into both eyes in the morning and at bedtime.     calcium acetate (PHOSLO) 667 MG capsule Take 1 capsule (667 mg total) by mouth 3 (three) times daily with meals.     capsaicin (ZOSTRIX) 0.025 % cream Apply 1 application  topically every 8 (eight) hours as needed (pain). Apply to R & L knees, R & L shoulders, R & L wrists, R & L dorsal surfaces on hands, and upper or lower back.     Cholecalciferol 1.25 MG (50000 UT) TABS Take 50,000 Units by mouth once a week. Sundays.     cloNIDine (CATAPRES) 0.1 MG tablet Take 0.1 mg by mouth every 12 (twelve) hours as needed (Hypertension). Give for SBP greater than 160 and DBP greater than 100 check BP after administration.     famotidine (PEPCID) 20 MG tablet Take 20 mg by mouth daily.     guaiFENesin (ROBITUSSIN) 100 MG/5ML liquid Take 100 mg by mouth every 4 (four) hours as needed for cough.     hydrALAZINE (APRESOLINE) 25 MG tablet Take 25 mg by mouth 3 (three) times daily.     hyoscyamine (ANASPAZ) 0.125 MG TBDP disintergrating tablet Place 0.125 mg under the tongue every 4 (four) hours as needed (excess secretions). Not to exceed 6 doses in 24 hours.     latanoprost (XALATAN) 0.005 % ophthalmic  solution Place 1 drop into the left eye at bedtime. (Patient taking differently: Place 1 drop into both eyes at bedtime.) 2.5 mL 12   losartan (COZAAR) 100 MG tablet Take 1 tablet (100 mg total) by mouth daily.     Melatonin 10 MG TABS Take 10 mg by mouth at bedtime.     Menthol, Topical Analgesic, 5 % GEL Apply 1 application. topically every 8 (eight) hours as needed (hand/wrist pain).     metolazone (ZAROXOLYN) 5 MG tablet Take 5 mg by mouth daily.     ondansetron (ZOFRAN) 4 MG tablet Take 4 mg by mouth every 6 (six) hours as needed for nausea or vomiting.     oxyCODONE-acetaminophen (PERCOCET/ROXICET) 5-325 MG tablet Take 1 tablet by mouth every 6 (six) hours as needed for moderate pain or severe pain (SHOB).     polyethylene glycol (MIRALAX / GLYCOLAX) 17 g packet Take 17 g by mouth every 12 (twelve) hours as needed for mild constipation or moderate constipation.     pramoxine-zinc acetate (CALADRYL) 1-0.1 % LOTN Apply 1 application. topically every 8 (eight) hours as needed (back, torso, arms,  and legs for puritus).     pregabalin (LYRICA) 25 MG capsule Take 1 capsule (25 mg total) by mouth daily. (Patient taking differently: Take 50 mg by mouth 2 (two) times daily.) 5 capsule 0   promethazine (PHENERGAN) 12.5 MG tablet Take 12.5 mg by mouth every 6 (six) hours as needed for nausea or vomiting. Not to exceed 4 doses in 24 hours.     sennosides-docusate sodium (SENOKOT-S) 8.6-50 MG tablet Take 2 tablets by mouth See admin instructions. Give 2 tablets by mouth one time a day every 3 days for constipation.     sennosides-docusate sodium (SENOKOT-S) 8.6-50 MG tablet Take 2 tablets by mouth in the morning and at bedtime.     torsemide (DEMADEX) 100 MG tablet Take 100 mg by mouth 2 (two) times daily.     Nutritional Supplements (FEEDING SUPPLEMENT, NEPRO CARB STEADY,) LIQD Take 120 mLs by mouth 2 (two) times daily.     OXYGEN Inhale 3 L into the lungs continuous.     Labs: Basic Metabolic  Panel: Recent Labs  Lab 01/19/23 0741 01/19/23 0747  NA 138 139  K 4.0 4.0  CL 96*  --   CO2 24  --   GLUCOSE 115*  --   BUN 46*  --   CREATININE 7.11*  --   CALCIUM 9.3  --    Liver Function Tests: Recent Labs  Lab 01/19/23 0741  AST 20  ALT 22  ALKPHOS 120  BILITOT 0.9  PROT 7.7  ALBUMIN 3.4*   No results for input(s): "LIPASE", "AMYLASE" in the last 168 hours. No results for input(s): "AMMONIA" in the last 168 hours. CBC: Recent Labs  Lab 01/19/23 0741 01/19/23 0747  WBC 10.7*  --   NEUTROABS 9.1*  --   HGB 11.5* 12.9*  HCT 36.9* 38.0*  MCV 93.9  --   PLT 146*  --    Cardiac Enzymes: No results for input(s): "CKTOTAL", "CKMB", "CKMBINDEX", "TROPONINI" in the last 168 hours. CBG: Recent Labs  Lab 01/19/23 0816  GLUCAP 99   Iron Studies: No results for input(s): "IRON", "TIBC", "TRANSFERRIN", "FERRITIN" in the last 72 hours. Studies/Results: DG Chest Port 1 View  Result Date: 01/19/2023 CLINICAL DATA:  Hypoxia EXAM: PORTABLE CHEST 1 VIEW COMPARISON:  12/25/2022 FINDINGS: Right more than left pleural effusion with diffuse hazy appearance of the chest. Chronic cardiomegaly with vascular pedicle widening. IMPRESSION: CHF pattern including pleural effusions. Electronically Signed   By: Tiburcio Pea M.D.   On: 01/19/2023 08:08    ROS: As per HPI otherwise negative.   Physical Exam: Vitals:   01/19/23 0900 01/19/23 1000 01/19/23 1133 01/19/23 1145  BP: (!) 155/91 (!) 157/94  (!) 155/90  Pulse:    80  Resp: 20 12  12   Temp:   98.7 F (37.1 C)   TempSrc:   Oral   SpO2: 93% 93%  91%  Weight:      Height:         General: Chronically ill appearing male, lethargic minimally responsive.  Head: Normocephalic, atraumatic, blind L eye Neck: JVD to mandible Lungs:Bilateral breath sounds decreased in bases with basilar crackles. RR 12-13 shallow. Heart: S1,S2, No M/R/G SR on monitor. Abdomen: NABS, , distended, possible ascites present Lower  extremities:Generalized anascarca present. 2-3+ non pitting edema BLE.  Neuro: Lethargic, requires sternal rub to awaken. Only stays awake briefly.  oriented X 1.  Dialysis Access: R AVF + T/B  Dialysis Orders: Center: [facility] MWF 4 hrs 180NRe  400/600 60 kg 2.0 K/ 2.0 Ca AVF - Heparin 3000 units IV three times per week - Hectorol 6 mcg IV three times per week - Mircera 150 mcg IV q 2 weeks (last dose 01/03/2023 Dose was due 01/16/2023  Assessment/Plan:  Acute hypoxic respiratory failure in setting of volume overload: Will have urgent HD today for volume removal then continue serial HD for volume removal, attempt to optimize volume  AMS-patient extremely lethargic, Concern about his being able to protect his airway. PCM consulted per primary. No azotemia per labs. DC sedating medications. Higher than renal dose lyrica noted on PTA med list. Please DC.  Muscle fasiculations: Rhythmic jerking noted UE. BUN not excessively elevated. Mostly likely related to lyrica. Discontinue meds.    ESRD -  MWF Missed HD 01/17/2023. Truncates every treatment. HD today off schedule and again 01/20/2023.   Hypertension/volume  - BP elevated, overtly volume overloaded by exam and CXR. UF as tolerated to optimize volume  Anemia  - HGB 11.5 No ESA needed. Missed last dose at OP center. Follow HGB.   Metabolic bone disease -  Last PO4 6.6 01/06/2023 corrected calcium OK. Continue VDRA, calcium acetate binders.   Nutrition - NPO at present DMT2 -per primary   H. Manson Passey, NP-C 01/19/2023, 12:40 PM  [employer] [phone]

## 2023-01-19 NOTE — ED Notes (Signed)
RN placed pt on 3L baseline O2 90%

## 2023-01-19 NOTE — Progress Notes (Signed)
   01/19/23 1337  Assess: MEWS Score  Temp 97.8 F (36.6 C)  BP (!) 159/110  MAP (mmHg) 118  Pulse Rate 90  ECG Heart Rate 90  Resp 20  Level of Consciousness Responds to Pain  SpO2 90 %  O2 Device Nasal Cannula  O2 Flow Rate (L/min) 5 L/min  Assess: MEWS Score  MEWS Temp 0  MEWS Systolic 0  MEWS Pulse 0  MEWS RR 0  MEWS LOC 2  MEWS Score 2  MEWS Score Color Yellow  Assess: if the MEWS score is Yellow or Red  Were vital signs accurate and taken at a resting state? Yes  Does the patient meet 2 or more of the SIRS criteria? No  MEWS guidelines implemented  Yes, yellow  Treat  MEWS Interventions Considered administering scheduled or prn medications/treatments as ordered  Take Vital Signs  Increase Vital Sign Frequency  Yellow: Q2hr x1, continue Q4hrs until patient remains green for 12hrs  Escalate  MEWS: Escalate Yellow: Discuss with charge nurse and consider notifying provider and/or RRT  Notify: Charge Nurse/RN  Name of Charge Nurse/RN Notified Eduardo Osier RN  Assess: SIRS CRITERIA  SIRS Temperature  0  SIRS Pulse 0  SIRS Respirations  0  SIRS WBC 0  SIRS Score Sum  0

## 2023-01-19 NOTE — Hospital Course (Addendum)
Christopher Davila is a 52 y.o. male who presented with lethargy, altered mental status and hypoxia. He was admitted to the family medicine teaching service. His hospital course is outlined by pertinent hospital problems below  Altered Mental Status/Lethargy/Somnolence Patient experienced increased somnolence in the setting of hypoxia while at [facility] facility and they called EMS. Patient has a history of end stage renal disease where he gets hemodialysis MWF. Upon admission patient was only arousable to deep sternal rub upon assessment. He received 80 mg of lasix IV (does produce urine) and nephrology was consulted. BUN only 46. After further research by nephrology, the patient last received HD on 7/31 and signed off 1 hour early. He has a chronic history of being chronically underdialyzed and volume overloaded due to shortened treatments. Nephrology recommended serial dialysis diuresis which started on 8/4.  Critical care was consulted to make for continued somnolence and believed his acute encephalopathy was related to pregabalin toxicity and improper dose in the setting of ESRD-did not have to be intubated. After second round of dialysis on 8/5, the patient had increased agitation and was combative towards the nursing staff. He was difficult to contain and required a dose of haldol. On 8/6 the patient was intermittently refusing medications and would not answer providers questions. Suspect, episode was acute delirium given his disability, new environment and frequent disturbances in circadian rhytym. Patient reported that his transportation driver, was the reason behind him leaving his dialysis sessions early. Nephrology, performed a dialysis treatment on 8/7 and patient was discharged to [facility].   Patient's mental status at discharge was improved prior to discharge.  Hypoxia Patient has a history of chronic respiratory failure on 3-4L at home. The patient was at SNF and they  were concerned due to his increased somnolence. At that time, they assessed his O2 sats which were in the 80s and called EMS. While in the ED, the patient was up to 5L and would desat back into the 80s whenever weaned down. Chest x-ray collected was notable for bilateral pleural effusions. The patient was given a dose of IV lasix and dialysized by nephrology. Repeat chest x-ray was obtained and demonstrated markedly decreased pleural effusions and decreased opacities suspect to atelectasis or pulmonary edema. The patient was satting well back 90s-100s on home oxygen requirement.    Congestion Heart Failure  Patient has a prior medical history with his most recent echo occurring in October of 2022. Updated echo was ordered and noted an EF 60-65 w/ grade 1 diastolic dysfunction. He was mantained on his home torsemide 100 mg BID regimen during  Other chronic conditions were medically managed with home medications and formulary alternatives as necessary ( HTN, Muscle Fasciculations,) -- HTN: Home amlodipine 5mg  on HD days, 10mg  on non-HD day, Losartan 100mg  daily,Home hydral 25mg  TID, Home metolazone 5mg  daily  - His home lyrica was discontinued per nephrology's recommendations, believed that his prior dose was contributing to his oversedation - His home percocet was also held and discontinued throughout the hospitalization to no exacerbate his lethargy and somnolence.    PCP Follow-up Recommendations: Discontinued lyrica, nephrology was concerned for potential toxicity in the setting of ESRD Discontinued percocet to not worsen altered mental status Discontinued melatonin 10 mg, to not worsen somnolence, could consider a smaller dose if sleep worsens.

## 2023-01-19 NOTE — ED Notes (Signed)
Pt is hard to arouse. RN sternum rub pt and he responds to pain. Pt lower extremities has +3 pitting edema. Pt has a healing wound on lower right leg that is bandage.

## 2023-01-19 NOTE — Assessment & Plan Note (Signed)
Elevated BP on admission. 150s/90s most recently. Perhaps volume overload is contributing here?  - Diuresis and HD as above   - Home amlodipine 5mg  on HD days, 10mg  on non-HD days - Home Losartan 100mg  daily - Home hydral 25mg  TID - Home metolazone 5mg  daily

## 2023-01-19 NOTE — Assessment & Plan Note (Addendum)
Patient has history 12/26/22 Alc 5.9 - Diet controlled

## 2023-01-19 NOTE — ED Notes (Signed)
Per MD, hold oral medication

## 2023-01-19 NOTE — Assessment & Plan Note (Signed)
On 5L [place], up from 3L documented at home. Appears to be grossly volume overloaded with 3+ pitting edema to BLE. Has both ESRD and CHF, recent echo w/ EF 60-65% w/ Grade 1 diastolic dysfunction. Makes urine, so given 80mg  IV Lasix in the ED. Is on torsemide 100mg  BID at his SNF. Continues to be volume overload and sating on 5L in the room.  - Progressive floor, Inpatient status - Have consulted nephrology for HD as I doubt we'll be able to adequately diurese him with just loop diuretics, appreciate their assistance  - No ischemic changes appreciated on EKG - Monitor UOP and redose loop diuretic as able. IV for now until his mental status improves - Strict I/O and daily weights

## 2023-01-19 NOTE — ED Notes (Signed)
RN attempted IV access twice without success.

## 2023-01-19 NOTE — Assessment & Plan Note (Addendum)
On 5L [place], up from 3L documented at home. Appears to be grossly volume overloaded with 3+ pitting edema to BLE. Has both ESRD and CHF, no recent echo available. Makes urine, so given 80mg  IV Lasix in the ED. Is on torsemide 100mg  BID at his SNF - Progressive floor, Inpatient status - Have consulted nephrology for HD as I doubt we'll be able to adequately diurese him with just loop diuretics, appreciate their assistance  - Troponin and Echo have been ordered. No ischemic changes appreciated on EKG - Monitor UOP and redose loop diuretic as able. IV for now until his mental status improves - Strict I/O and daily weights

## 2023-01-19 NOTE — Progress Notes (Signed)
FMTS Brief Progress Note  S: Went to assess patient at bedside with Dr. Dolan Amen.  Patient has not gone to HD yet I have called the HD desk and they have told me that he is scheduled for after midnight.   O: BP (!) 191/99 (BP Location: Left Arm)   Pulse 88   Temp 98.1 F (36.7 C) (Axillary)   Resp 18   Ht 5\' 5"  (1.651 m)   Wt 63.5 kg   SpO2 90%   BMI 23.30 kg/m    Gen: Sleeping comfortably Neuro: Does awaken to voice, follows commands of opening eyes with a lot of effort, groans in pain when sternal rubbing but does not flex or extend arms, able to tell me his name and then goes back to rest, unable to assess pupillary reflex given opacified corneas bilaterally Resp: Saturating in low 90s on 5 L nasal cannula, no increased work of breathing Abd: Distended, nontender to palpation Extremities: 3+ pitting edema to knees bilaterally  A/P: AMS Likely in the setting of pregabalin use and shorten HD sessions.  He is scheduled for urgent HD tonight.  He is grossly volume overloaded but appears to be doing okay.  I called and discussed with HD to ensure patient would get HD tonight.  Will monitor closely.  Currently n.p.o. given mental status.  - Orders reviewed. Labs for AM ordered, which was adjusted as needed.  - If condition changes, plan includes speaking with CCM  Christopher Erp, MD 01/19/2023, 8:31 PM PGY-3, [facility] Night Resident  Please page [phone] with questions.

## 2023-01-19 NOTE — ED Notes (Signed)
ED TO INPATIENT HANDOFF REPORT  ED Nurse Name and Phone #: [phone] Tori   S Name/Age/Gender Christopher Davila 52 y.o. male Room/Bed: 005C/005C  Code Status   Code Status: Full Code  Home/SNF/Other Nursing Home  Is this baseline? No   Triage Complete: Triage complete  Chief Complaint Acute hypoxic respiratory failure (HCC) [J96.01]  Triage Note Pt from [facility]. Staff state pt lethargic and were having difficulty arousing pt. Pt a and o x 4. Hypertensive. 3Lnc baseline. Pt was84% on 3lo2 on scene. Pt blind.diaylsis m/w/f.   Allergies Allergies  Allergen Reactions   Morphine Itching    Pt prefers not to be given this drug   Morphine And Codeine Itching and Other (See Comments)    Pt prefers not to be given this drug    Level of Care/Admitting Diagnosis ED Disposition     ED Disposition  Admit   Condition  --   Comment  Hospital Area: [facility] [[id number]]  Level of Care: Progressive [102]  Admit to Progressive based on following criteria: NEPHROLOGY stable condition requiring close monitoring for AKI, requiring Hemodialysis or Peritoneal Dialysis either from expected electrolyte imbalance, acidosis, or fluid overload that can be managed by NIPPV or high flow oxygen.  May admit patient to [facility] or [facility] if equivalent level of care is available:: No  Covid Evaluation: Asymptomatic - no recent exposure (last 10 days) testing not required  Diagnosis: Acute hypoxic respiratory failure [facility]) [[id number]]  Admitting Physician: Peterson Ao [[id number]]  Attending Physician: Carney Living [phone]  Certification:: I certify this patient will need inpatient services for at least 2 midnights  Estimated Length of Stay: 2          B Medical/Surgery History Past Medical History:  Diagnosis Date   Acute on chronic diastolic CHF (congestive heart failure) (HCC) 09/27/2019   Acute respiratory failure with hypoxia (HCC)  03/26/2021   Acute urinary retention 10/13/2019   Anemia    Blindness 09/04/2019   CHF (congestive heart failure) (HCC)    COVID-19    Diabetes mellitus    Diarrhea 10/14/2019   Epigastric abdominal pain    ESRD (end stage renal disease) (HCC)    Gastroparesis due to DM (HCC)    Hypertension    Hypertensive urgency 04/17/2019   Hypervolemia    Hypoalbuminemia 04/17/2019   Hypoxia    Metabolic acidosis, increased anion gap 10/13/2019   Multifocal pneumonia 05/06/2021   Neuropathy of lower extremity    Oral candidiasis 06/26/2012   Palliative care encounter    Pancreatitis 06/26/2012   Weight loss 06/26/2012   Past Surgical History:  Procedure Laterality Date   AV FISTULA PLACEMENT Right 10/29/2019   Procedure: RIGHT ARM BRACHIOBASILIC ARTERIOVENOUS (AV) FISTULA CREATION;  Surgeon: Larina Earthly, MD;  Location: [facility] OR;  Service: Vascular;  Laterality: Right;   BASCILIC VEIN TRANSPOSITION Right 01/05/2020   Procedure: RIGHT ARM SECOND STAGE BASCILIC VEIN TRANSPOSITION;  Surgeon: Larina Earthly, MD;  Location: [facility] OR;  Service: Vascular;  Laterality: Right;   EYE SURGERY     IR FLUORO GUIDE CV LINE RIGHT  10/28/2019   IR US GUIDE VASC ACCESS RIGHT  10/28/2019     A IV Location/Drains/Wounds Patient Lines/Drains/Airways Status     Active Line/Drains/Airways     Name Placement date Placement time Site Days   Peripheral IV 01/19/23 22 G 1.75" Anterior;Left Forearm 01/19/23  0958  Forearm  less than 1   Fistula /  Graft Right Upper arm Arteriovenous fistula 10/29/19  1319  Upper arm  1178   External Urinary Catheter 01/19/23  1021  --  less than 1   Wound / Incision (Open or Dehisced) 11/05/21 Skin tear Foot Left;Lower 11/05/21  2230  Foot  440   Wound / Incision (Open or Dehisced) 11/05/21 Skin tear Foot Anterior;Right 11/05/21  2230  Foot  440   Wound / Incision (Open or Dehisced) 11/05/21 Diabetic ulcer Heel Left 11/05/21  2230  Heel  440   Wound / Incision (Open or Dehisced)  12/26/22 Non-pressure wound Leg Right;Lateral Right Lateral Lower Leg Wound 12/26/22  0145  Leg  24   Wound / Incision (Open or Dehisced) 12/26/22 Non-pressure wound Foot Left;Lateral Left Lateral Foot Wound 12/26/22  0145  Foot  24            Intake/Output Last 24 hours No intake or output data in the 24 hours ending 01/19/23 1247  Labs/Imaging Results for orders placed or performed during the hospital encounter of 01/19/23 (from the past 48 hour(s))  Comprehensive metabolic panel     Status: Abnormal   Collection Time: 01/19/23  7:41 AM  Result Value Ref Range   Sodium 138 135 - 145 mmol/L   Potassium 4.0 3.5 - 5.1 mmol/L   Chloride 96 (L) 98 - 111 mmol/L   CO2 24 22 - 32 mmol/L   Glucose, Bld 115 (H) 70 - 99 mg/dL    Comment: Glucose reference range applies only to samples taken after fasting for at least 8 hours.   BUN 46 (H) 6 - 20 mg/dL   Creatinine, Ser 7.82 (H) 0.61 - 1.24 mg/dL   Calcium 9.3 8.9 - 95.6 mg/dL   Total Protein 7.7 6.5 - 8.1 g/dL   Albumin 3.4 (L) 3.5 - 5.0 g/dL   AST 20 15 - 41 U/L   ALT 22 0 - 44 U/L   Alkaline Phosphatase 120 38 - 126 U/L   Total Bilirubin 0.9 0.3 - 1.2 mg/dL   GFR, Estimated 9 (L) >60 mL/min    Comment: (NOTE) Calculated using the CKD-EPI Creatinine Equation (2021)    Anion gap 18 (H) 5 - 15    Comment: Performed at [facility], [address] [place], [place] [address]  CBC with Differential     Status: Abnormal   Collection Time: 01/19/23  7:41 AM  Result Value Ref Range   WBC 10.7 (H) 4.0 - 10.5 K/uL   RBC 3.93 (L) 4.22 - 5.81 MIL/uL   Hemoglobin 11.5 (L) 13.0 - 17.0 g/dL   HCT 65.7 (L) 84.6 - 96.2 %   MCV 93.9 80.0 - 100.0 fL   MCH 29.3 26.0 - 34.0 pg   MCHC 31.2 30.0 - 36.0 g/dL   RDW 95.2 (H) 84.1 - 32.4 %   Platelets 146 (L) 150 - 400 K/uL   nRBC 0.0 0.0 - 0.2 %   Neutrophils Relative % 86 %   Neutro Abs 9.1 (H) 1.7 - 7.7 K/uL   Lymphocytes Relative 7 %   Lymphs Abs 0.7 0.7 - 4.0 K/uL   Monocytes Relative  6 %   Monocytes Absolute 0.6 0.1 - 1.0 K/uL   Eosinophils Relative 1 %   Eosinophils Absolute 0.1 0.0 - 0.5 K/uL   Basophils Relative 0 %   Basophils Absolute 0.0 0.0 - 0.1 K/uL   Immature Granulocytes 0 %   Abs Immature Granulocytes 0.04 0.00 - 0.07 K/uL  Comment: Performed at [facility], [address] [place], [place] [address]  Brain natriuretic peptide     Status: Abnormal   Collection Time: 01/19/23  7:41 AM  Result Value Ref Range   B Natriuretic Peptide 897.2 (H) 0.0 - 100.0 pg/mL    Comment: Performed at [facility], [address], [place], [place] [address]  Ethanol     Status: None   Collection Time: 01/19/23  7:41 AM  Result Value Ref Range   Alcohol, Ethyl (B) <10 <10 mg/dL    Comment: (NOTE) Lowest detectable limit for serum alcohol is 10 mg/dL.  For medical purposes only. Performed at [facility], [address] [place], [place] [address]   I-Stat venous blood gas, ED     Status: Abnormal   Collection Time: 01/19/23  7:47 AM  Result Value Ref Range   pH, Ven 7.465 (H) 7.25 - 7.43   pCO2, Ven 35.4 (L) 44 - 60 mmHg   pO2, Ven 59 (H) 32 - 45 mmHg   Bicarbonate 25.5 20.0 - 28.0 mmol/L   TCO2 27 22 - 32 mmol/L   O2 Saturation 92 %   Acid-Base Excess 2.0 0.0 - 2.0 mmol/L   Sodium 139 135 - 145 mmol/L   Potassium 4.0 3.5 - 5.1 mmol/L   Calcium, Ion 1.04 (L) 1.15 - 1.40 mmol/L   HCT 38.0 (L) 39.0 - 52.0 %   Hemoglobin 12.9 (L) 13.0 - 17.0 g/dL   Sample type VENOUS   CBG monitoring, ED     Status: None   Collection Time: 01/19/23  8:16 AM  Result Value Ref Range   Glucose-Capillary 99 70 - 99 mg/dL    Comment: Glucose reference range applies only to samples taken after fasting for at least 8 hours.   DG Chest Port 1 View  Result Date: 01/19/2023 CLINICAL DATA:  Hypoxia EXAM: PORTABLE CHEST 1 VIEW COMPARISON:  12/25/2022 FINDINGS: Right more than left pleural effusion with diffuse hazy appearance of the chest. Chronic cardiomegaly with  vascular pedicle widening. IMPRESSION: CHF pattern including pleural effusions. Electronically Signed   By: Tiburcio Pea M.D.   On: 01/19/2023 08:08    Pending Labs Unresulted Labs (From admission, onward)     Start     Ordered   01/19/23 1214  Hepatitis B surface antigen  (New Admission Hemo Labs (Hepatitis B))  Once,   R        01/19/23 1214   01/19/23 1214  Hepatitis B surface antibody,quantitative  (New Admission Hemo Labs (Hepatitis B))  Once,   R        01/19/23 1214   01/19/23 1029  Ammonia  Once,   R        01/19/23 1029   01/19/23 1028  TSH  Once,   R        01/19/23 1029            Vitals/Pain Today's Vitals   01/19/23 0900 01/19/23 1000 01/19/23 1133 01/19/23 1145  BP: (!) 155/91 (!) 157/94  (!) 155/90  Pulse:    80  Resp: 20 12  12   Temp:   98.7 F (37.1 C)   TempSrc:   Oral   SpO2: 93% 93%  91%  Weight:      Height:      PainSc:        Isolation Precautions No active isolations  Medications Medications  aspirin EC tablet 81 mg (81 mg Oral  Not Given 01/19/23 1217)  amLODipine (NORVASC) tablet 10 mg (has no administration in time range)  amLODipine (NORVASC) tablet 5 mg (has no administration in time range)  hydrALAZINE (APRESOLINE) tablet 25 mg (25 mg Oral Not Given 01/19/23 1217)  losartan (COZAAR) tablet 100 mg (100 mg Oral Not Given 01/19/23 1218)  metolazone (ZAROXOLYN) tablet 5 mg (5 mg Oral Not Given 01/19/23 1218)  torsemide (DEMADEX) tablet 100 mg (has no administration in time range)  calcium acetate (PHOSLO) capsule 667 mg (667 mg Oral Not Given 01/19/23 1222)  famotidine (PEPCID) tablet 20 mg (20 mg Oral Not Given 01/19/23 1222)  melatonin tablet 10 mg (has no administration in time range)  brimonidine-timolol (COMBIGAN) 0.2-0.5 % ophthalmic solution 1 drop (has no administration in time range)  latanoprost (XALATAN) 0.005 % ophthalmic solution 1 drop (has no administration in time range)  heparin injection 5,000 Units (has no administration in time  range)  Chlorhexidine Gluconate Cloth 2 % PADS 6 each (has no administration in time range)  furosemide (LASIX) injection 80 mg (80 mg Intravenous Given 01/19/23 1020)    Mobility non-ambulatory     Focused Assessments Cardiac Assessment Handoff:    Lab Results  Component Value Date   CKTOTAL 84 05/06/2021   CKMB 0.9 12/22/2007   TROPONINI <0.03 05/24/2015   Lab Results  Component Value Date   DDIMER 5.47 (H) 03/31/2021   Does the Patient currently have chest pain? No    R Recommendations: See Admitting Provider Note  Report given to:   Additional Notes: axox4

## 2023-01-19 NOTE — ED Notes (Signed)
RN attempted to wake pt up with a sternum rub. Pt responded to rub and moved RN hands away. Pt didn't respond to RN questions.

## 2023-01-19 NOTE — Assessment & Plan Note (Addendum)
Drowsy but awakens to noxious stimuli and can answer questions appropriately. Unclear etiology. He was hypoxic on arrival but not to the degree I would expect to cause these symptoms. Perhaps this is metabolic? Could he have had incomplete dialysis on Friday causing volume overload and metabolic encephalopathy? VBG without evidence of hypercarbia. Looking back at past admissions it does seem that he is frequently encephalopathic on arrival but improves with treatment of his underlying condition. Encephalopathy improved after first round of dialysis, will require additional round. Still has some lethargy, however awakens w/ verbal stimuli and will speak w/ delayed responses.  - CT head negative for acute process - GCS improved this morning - Monitor mental status post-HD.  - Holding pregabalin and narcotics

## 2023-01-19 NOTE — Progress Notes (Signed)
*  PRELIMINARY RESULTS* Echocardiogram 2D Echocardiogram has been performed.  Christopher Davila 01/19/2023, 4:24 PM

## 2023-01-19 NOTE — Consult Note (Signed)
NAME:  Christopher Davila, MRN:  [id number], DOB:  10/22/1970, LOS: 0 ADMISSION DATE:  01/19/2023, CONSULTATION DATE:  01/19/2023 REFERRING MD:  Carney Living, MD, CHIEF COMPLAINT:  encephalopathy   History of Present Illness:   Patient is a 52 year old gentleman with a history of end-stage renal disease on dialysis, chronic respiratory failure on 3 L oxygen, Who presents earlier this morning from his SNF with lethargy.  History is limited and largely obtained by chart review.  He was found to be hypoxemic and placed on oxygen on arrival to the ED.  He was also hypertensive.  Chest x-ray showed fluid overload.  Based on the discussion with the nephrology team, he has not been getting his full dialysis runs.  He signed out early.  He is 9 kg over his outpatient estimated dry weight.  Given his lethargy and somnolence, we were contacted by the primary team for evaluation for ICU.  Pertinent  Medical History  ESRD on HD  Significant Hospital Events: Including procedures, antibiotic start and stop dates in addition to other pertinent events     Interim History / Subjective:    Objective   Blood pressure (!) 159/110, pulse 90, temperature 97.8 F (36.6 C), temperature source Oral, resp. rate 20, height 5\' 5"  (1.651 m), weight 63.5 kg, SpO2 90%.       No intake or output data in the 24 hours ending 01/19/23 1615 Filed Weights   01/19/23 0659  Weight: 63.5 kg    Examination: (Patient was seen and examined earlier this morning)  On my evaluation he is able to tell me his name and tells me that he feels fine.  He is currently somnolent but arouses.  He does move all extremities. Regular rate and rhythm, systolic murmur Right upper extremity AV fistula with palpable thrill and bruit On nasal cannula, breathing is nonlabored, on oxygen, no wheeze He does have some facial and lower extremity twitching  Resolved Hospital Problem list     Assessment & Plan:   Acute  encephalopathy ESRD on HD Volume overload - most likely this is related to pregabalin toxicity. I suspect it will improve with time and possibly a little with dialysis. Agree with stopping pregabalin for now and considering dose reduction if this is going to be resumed at discharge.   I think he is safe to remain at his current level of care without ICU. Please re-engage [place] if the clinical situation changes.   Durel Salts, MD Pulmonary and Critical Care Medicine [facility] 01/19/2023 4:17 PM Pager: see AMION  If no response to pager, please call critical care on call (see AMION) until 7pm After 7:00 pm call Elink     Labs   CBC: Recent Labs  Lab 01/19/23 0741 01/19/23 0747  WBC 10.7*  --   NEUTROABS 9.1*  --   HGB 11.5* 12.9*  HCT 36.9* 38.0*  MCV 93.9  --   PLT 146*  --     Basic Metabolic Panel: Recent Labs  Lab 01/19/23 0741 01/19/23 0747  NA 138 139  K 4.0 4.0  CL 96*  --   CO2 24  --   GLUCOSE 115*  --   BUN 46*  --   CREATININE 7.11*  --   CALCIUM 9.3  --    GFR: Estimated Creatinine Clearance: 10.8 mL/min (A) (by C-G formula based on SCr of 7.11 mg/dL (H)). Recent Labs  Lab 01/19/23 0741  WBC 10.7*    Liver Function  Tests: Recent Labs  Lab 01/19/23 0741  AST 20  ALT 22  ALKPHOS 120  BILITOT 0.9  PROT 7.7  ALBUMIN 3.4*   No results for input(s): "LIPASE", "AMYLASE" in the last 168 hours. Recent Labs  Lab 01/19/23 1152  AMMONIA 16    ABG    Component Value Date/Time   PHART 7.235 (L) 10/25/2019 1210   PCO2ART 55.1 (H) 10/25/2019 1210   PO2ART 127 (H) 10/25/2019 1210   HCO3 25.5 01/19/2023 0747   TCO2 27 01/19/2023 0747   ACIDBASEDEF 3.9 (H) 10/25/2019 1210   O2SAT 92 01/19/2023 0747     Coagulation Profile: No results for input(s): "INR", "PROTIME" in the last 168 hours.  Cardiac Enzymes: No results for input(s): "CKTOTAL", "CKMB", "CKMBINDEX", "TROPONINI" in the last 168 hours.  HbA1C: Hgb A1c MFr Bld  Date/Time  Value Ref Range Status  12/26/2022 08:27 AM 5.9 (H) 4.8 - 5.6 % Final    Comment:    (NOTE)         Prediabetes: 5.7 - 6.4         Diabetes: >6.4         Glycemic control for adults with diabetes: <7.0   03/26/2021 05:53 AM 4.6 (L) 4.8 - 5.6 % Final    Comment:    (NOTE) Pre diabetes:          5.7%-6.4%  Diabetes:              >6.4%  Glycemic control for   <7.0% adults with diabetes     CBG: Recent Labs  Lab 01/19/23 0816  GLUCAP 99    Review of Systems:   Denies pain, otherwise unable to obtain.   Past Medical History:  He,  has a past medical history of Acute on chronic diastolic CHF (congestive heart failure) (HCC) (09/27/2019), Acute respiratory failure with hypoxia (HCC) (03/26/2021), Acute urinary retention (10/13/2019), Anemia, Blindness (09/04/2019), CHF (congestive heart failure) (HCC), COVID-19, Diabetes mellitus, Diarrhea (10/14/2019), Epigastric abdominal pain, ESRD (end stage renal disease) (HCC), Gastroparesis due to DM [facility]), Hypertension, Hypertensive urgency (04/17/2019), Hypervolemia, Hypoalbuminemia (04/17/2019), Hypoxia, Metabolic acidosis, increased anion gap (10/13/2019), Multifocal pneumonia (05/06/2021), Neuropathy of lower extremity, Oral candidiasis (06/26/2012), Palliative care encounter, Pancreatitis (06/26/2012), and Weight loss (06/26/2012).   Surgical History:   Past Surgical History:  Procedure Laterality Date   AV FISTULA PLACEMENT Right 10/29/2019   Procedure: RIGHT ARM BRACHIOBASILIC ARTERIOVENOUS (AV) FISTULA CREATION;  Surgeon: Larina Earthly, MD;  Location: [facility] OR;  Service: Vascular;  Laterality: Right;   BASCILIC VEIN TRANSPOSITION Right 01/05/2020   Procedure: RIGHT ARM SECOND STAGE BASCILIC VEIN TRANSPOSITION;  Surgeon: Larina Earthly, MD;  Location: [facility] OR;  Service: Vascular;  Laterality: Right;   EYE SURGERY     IR FLUORO GUIDE CV LINE RIGHT  10/28/2019   IR US GUIDE VASC ACCESS RIGHT  10/28/2019     Social History:   reports that he  quit smoking about 7 years ago. His smoking use included cigarettes. He has never used smokeless tobacco. He reports that he does not currently use alcohol. He reports that he does not use drugs.   Family History:  His family history includes CAD in his cousin and maternal aunt; Diabetes in his maternal aunt and mother; Hypertension in his mother; Lung disease in his mother.   Allergies Allergies  Allergen Reactions   Morphine Itching    Pt prefers not to be given this drug   Morphine And Codeine Itching and Other (See  Comments)    Pt prefers not to be given this drug     Home Medications  Prior to Admission medications   Medication Sig Start Date End Date Taking? Authorizing Provider  acetaminophen (TYLENOL) 650 MG suppository Place 650 mg rectally every 6 (six) hours as needed for mild pain or moderate pain.   Yes [provider]  albuterol (VENTOLIN HFA) 108 (90 Base) MCG/ACT inhaler Inhale 2 puffs into the lungs every 4 (four) hours as needed for shortness of breath.   Yes [provider]  amLODipine (NORVASC) 5 MG tablet Take 10 mg by mouth See admin instructions. Give 10mg  by mouth one time a day every Tuesday, Thursday, Saturday, Sunday for htn. 05/10/22  Yes [provider]  amLODipine (NORVASC) 5 MG tablet Take 5 mg by mouth See admin instructions. Give 5mg  by mouth one time a day every Monday, Wednesday, Friday for Hypertension.   Yes [provider]  aspirin EC 81 MG tablet Take 1 tablet (81 mg total) by mouth daily. 05/24/15  Yes Ghimire, Werner Lean, MD  atorvastatin (LIPITOR) 40 MG tablet Take 1 tablet (40 mg total) by mouth daily. 12/05/20  Yes [place], Peter M, MD  brimonidine-timolol (COMBIGAN) 0.2-0.5 % ophthalmic solution Place 1 drop into both eyes in the morning and at bedtime.   Yes [provider]  calcium acetate (PHOSLO) 667 MG capsule Take 1 capsule (667 mg total) by mouth 3 (three) times daily with meals. 11/19/19  Yes Marinda Elk, MD  capsaicin (ZOSTRIX) 0.025 % cream Apply 1 application  topically every 8 (eight) hours as needed (pain). Apply to R & L knees, R & L shoulders, R & L wrists, R & L dorsal surfaces on hands, and upper or lower back.   Yes [provider]  Cholecalciferol 1.25 MG (50000 UT) TABS Take 50,000 Units by mouth once a week. Sundays.   Yes [provider]  cloNIDine (CATAPRES) 0.1 MG tablet Take 0.1 mg by mouth every 12 (twelve) hours as needed (Hypertension). Give for SBP greater than 160 and DBP greater than 100 check BP after administration.   Yes [provider]  famotidine (PEPCID) 20 MG tablet Take 20 mg by mouth daily. 05/10/22  Yes [provider]  guaiFENesin (ROBITUSSIN) 100 MG/5ML liquid Take 100 mg by mouth every 4 (four) hours as needed for cough.   Yes [provider]  hydrALAZINE (APRESOLINE) 25 MG tablet Take 25 mg by mouth 3 (three) times daily.   Yes [provider]  hyoscyamine (ANASPAZ) 0.125 MG TBDP disintergrating tablet Place 0.125 mg under the tongue every 4 (four) hours as needed (excess secretions). Not to exceed 6 doses in 24 hours.   Yes [provider]  latanoprost (XALATAN) 0.005 % ophthalmic solution Place 1 drop into the left eye at bedtime. Patient taking differently: Place 1 drop into both eyes at bedtime. 09/23/19  Yes Vann, Jessica U, DO  losartan (COZAAR) 100 MG tablet Take 1 tablet (100 mg total) by mouth daily. 11/19/19  Yes Marinda Elk, MD  Melatonin 10 MG TABS Take 10 mg by mouth at bedtime.   Yes [provider]  Menthol, Topical Analgesic, 5 % GEL Apply 1 application. topically every 8 (eight) hours as needed (hand/wrist pain).   Yes [provider]  metolazone (ZAROXOLYN) 5 MG tablet Take 5 mg by mouth daily. 05/10/22  Yes [provider]  ondansetron (ZOFRAN) 4 MG tablet Take 4 mg by mouth  every 6 (six) hours as needed for nausea or vomiting.   Yes  [provider]  oxyCODONE-acetaminophen (PERCOCET/ROXICET) 5-325 MG tablet Take 1 tablet by mouth every 6 (six) hours as needed for moderate pain or severe pain (SHOB). 06/04/22  Yes [provider]  polyethylene glycol (MIRALAX / GLYCOLAX) 17 g packet Take 17 g by mouth every 12 (twelve) hours as needed for mild constipation or moderate constipation.   Yes [provider]  pramoxine-zinc acetate (CALADRYL) 1-0.1 % LOTN Apply 1 application. topically every 8 (eight) hours as needed (back, torso, arms, and legs for puritus).   Yes [provider]  pregabalin (LYRICA) 25 MG capsule Take 1 capsule (25 mg total) by mouth daily. Patient taking differently: Take 50 mg by mouth 2 (two) times daily. 11/07/21  Yes Narda Bonds, MD  promethazine (PHENERGAN) 12.5 MG tablet Take 12.5 mg by mouth every 6 (six) hours as needed for nausea or vomiting. Not to exceed 4 doses in 24 hours.   Yes [provider]  sennosides-docusate sodium (SENOKOT-S) 8.6-50 MG tablet Take 2 tablets by mouth See admin instructions. Give 2 tablets by mouth one time a day every 3 days for constipation.   Yes [provider]  sennosides-docusate sodium (SENOKOT-S) 8.6-50 MG tablet Take 2 tablets by mouth in the morning and at bedtime.   Yes [provider]  torsemide (DEMADEX) 100 MG tablet Take 100 mg by mouth 2 (two) times daily.   Yes [provider]  Nutritional Supplements (FEEDING SUPPLEMENT, NEPRO CARB STEADY,) LIQD Take 120 mLs by mouth 2 (two) times daily.    [provider]  OXYGEN Inhale 3 L into the lungs continuous.    [provider]     Critical care time:

## 2023-01-20 DIAGNOSIS — J9601 Acute respiratory failure with hypoxia: Secondary | ICD-10-CM | POA: Diagnosis not present

## 2023-01-20 DIAGNOSIS — R451 Restlessness and agitation: Secondary | ICD-10-CM | POA: Insufficient documentation

## 2023-01-20 LAB — GLUCOSE, CAPILLARY
Glucose-Capillary: 86 mg/dL (ref 70–99)
Glucose-Capillary: 91 mg/dL (ref 70–99)
Glucose-Capillary: 93 mg/dL (ref 70–99)

## 2023-01-20 LAB — CBC
HCT: 36.2 % — ABNORMAL LOW (ref 39.0–52.0)
Hemoglobin: 11.5 g/dL — ABNORMAL LOW (ref 13.0–17.0)
MCH: 29.2 pg (ref 26.0–34.0)
MCHC: 31.8 g/dL (ref 30.0–36.0)
MCV: 91.9 fL (ref 80.0–100.0)
Platelets: 159 10*3/uL (ref 150–400)
RBC: 3.94 MIL/uL — ABNORMAL LOW (ref 4.22–5.81)
RDW: 16.7 % — ABNORMAL HIGH (ref 11.5–15.5)
WBC: 11.1 10*3/uL — ABNORMAL HIGH (ref 4.0–10.5)
nRBC: 0 % (ref 0.0–0.2)

## 2023-01-20 LAB — HEPATITIS B CORE ANTIBODY, IGM: Hep B C IgM: NONREACTIVE

## 2023-01-20 MED ORDER — HALOPERIDOL LACTATE 5 MG/ML IJ SOLN: 0.5000 mg | Freq: Once | INTRAMUSCULAR | Status: DC

## 2023-01-20 MED ORDER — CHLORHEXIDINE GLUCONATE CLOTH 2 % EX PADS
6.0000 | MEDICATED_PAD | Freq: Every day | CUTANEOUS | Status: DC
  Administered 2023-01-20: 6 via TOPICAL

## 2023-01-20 MED ORDER — LIDOCAINE HCL (PF) 1 % IJ SOLN: 5.0000 mL | INTRAMUSCULAR | Status: DC | PRN

## 2023-01-20 MED ORDER — HEPARIN SODIUM (PORCINE) 1000 UNIT/ML DIALYSIS: 1000.0000 [IU] | INTRAMUSCULAR | Status: DC | PRN

## 2023-01-20 MED ORDER — HALOPERIDOL 0.5 MG PO TABS
0.5000 mg | ORAL_TABLET | Freq: Once | ORAL | Status: DC
  Filled 2023-01-20: qty 1

## 2023-01-20 MED ORDER — MUPIROCIN 2 % EX OINT
1.0000 | TOPICAL_OINTMENT | Freq: Two times a day (BID) | CUTANEOUS | Status: DC
  Administered 2023-01-20 – 2023-01-22 (×2): 1 via NASAL
  Filled 2023-01-20: qty 22

## 2023-01-20 MED ORDER — LIDOCAINE-PRILOCAINE 2.5-2.5 % EX CREA: 1.0000 | TOPICAL_CREAM | CUTANEOUS | Status: DC | PRN

## 2023-01-20 MED ORDER — PENTAFLUOROPROP-TETRAFLUOROETH EX AERO: 1.0000 | INHALATION_SPRAY | CUTANEOUS | Status: DC | PRN

## 2023-01-20 MED ORDER — SODIUM CHLORIDE 0.9 % IV SOLN: INTRAVENOUS | Status: DC | PRN

## 2023-01-20 MED ORDER — HEPARIN SODIUM (PORCINE) 1000 UNIT/ML DIALYSIS
3000.0000 [IU] | Freq: Once | INTRAMUSCULAR | Status: AC
  Administered 2023-01-20: 3000 [IU] via INTRAVENOUS_CENTRAL
  Filled 2023-01-20: qty 3

## 2023-01-20 MED ORDER — HALOPERIDOL LACTATE 5 MG/ML IJ SOLN
0.5000 mg | Freq: Once | INTRAMUSCULAR | Status: AC
  Administered 2023-01-20: 0.5 mg via INTRAVENOUS
  Filled 2023-01-20: qty 1

## 2023-01-20 NOTE — Progress Notes (Signed)
   01/20/23 1900  Vitals  Temp (!) 97.5 F (36.4 C)  Pulse Rate 94  Resp 13  BP (!) 148/87  SpO2 99 %  O2 Device Nasal Cannula  Weight 56.8 kg  Type of Weight Post-Dialysis  Oxygen Therapy  O2 Flow Rate (L/min) 4 L/min  Patient Activity (if Appropriate) In bed  Pulse Oximetry Type Continuous  Oximetry Probe Site Changed No  Post Treatment  Dialyzer Clearance Lightly streaked  Duration of HD Treatment -hour(s) 4 hour(s)  Hemodialysis Intake (mL) 0 mL  Liters Processed 89.8  Fluid Removed (mL) 4000 mL  Tolerated HD Treatment Yes  AVG/AVF Arterial Site Held (minutes) 10 minutes  AVG/AVF Venous Site Held (minutes) 10 minutes   Received patient in bed to unit.  Alert and oriented.  Informed consent signed and in chart.   TX duration:4.0  Patient tolerated well.  Transported back to the room  Alert, without acute distress.  Hand-off given to patient's nurse.   Access used: ruaf Access issues: no comlications  Total UF removed: 4000 Medication(s) given: hectoral iv x 1   [facility]

## 2023-01-20 NOTE — Discharge Instructions (Signed)
Dear Christopher Davila,   Thank you so much for allowing [place] to be part of your care!  You were admitted to [facility] for altered mental status. You received multiple HD sessions and your mental status improved.   POST-HOSPITAL & CARE INSTRUCTIONS Please attend and stay through your entire HD sessions Please let PCP/Specialists know of any changes that were made.  Please see medications section of this packet for any medication changes.   DOCTOR'S APPOINTMENT & FOLLOW UP CARE INSTRUCTIONS  Future Appointments  Date Time Provider Department Center  04/07/2023  1:45 PM Helane Gunther, DPM [facility]    RETURN PRECAUTIONS: Confusion, fevers  Take care and be well!  Family Medicine Teaching Service  [place]  [facility]  [address] [place], [place] [address] [phone]

## 2023-01-20 NOTE — Progress Notes (Addendum)
FMTS Brief Progress Note  S: Went to reassess patient after dialysis.  He got 5 L taken off of him during dialysis.  On my examination he is somewhat more alert although still very sleepy.  He is able to tell me his name, he tells me that he is at [employer] corrected him and tell him that he is at [facility], does not know situation, he tells me that the year is 2023, when asked who is president he states " I am not that guy."  He is able to follow my commands of opening his eyes although it is a slowed/delayed response  He is able to follow commands of squeezing my hands  He is able to move his extremities, no sided differences that I notice  He denies any pain except for when I am pressing on his legs to examine his edema  O: BP (!) 162/91 (BP Location: Left Arm)   Pulse 93   Temp 98 F (36.7 C) (Oral)   Resp 15   Ht 5\' 5"  (1.651 m)   Wt 63.5 kg   SpO2 97%   BMI 23.30 kg/m    GEN: NAD, lethargic RESP: Chest rises symmetrical, no increased work of breathing on 4L CV: Extremities well-perfused ABD: Still quite distended but less tense than previous examination, nontender Neuro: Able to follow commands such as squeezing my fingers with his hands; delayed/slowed responses to questions, keeps his eyes closed while answering but am able to have him open his eyes with commands, oriented to person (states the year is 2023, does not state president name, thinks he is in [place], unclear of his situation) EXT: 3+ pitting edema to midshin bilaterally  A/P: AMS Oriented to person.  He is definitely improved after his HD session although still quite slowed responses.  GCS definitely is improved-now 13 E(3) V(4) M(6).  He is still quite volume overloaded on examination.  Anticipate that he may need additional HD sessions per nephrology to continue to help with his volume overload and AMS.  Acute on chronic hypoxic respiratory failure Was initially up to 5 to 6 L prior to HD.   Currently still on 4-5 L of oxygen after HD session as removed 5 L of fluid.  Seems to be breathing comfortably.  HTN Continues to be elevated but less elevated after HD session.  Last blood pressure 170s systolic.  Likely needs additional volume removed.  He is not alert enough at the moment for oral medications.  Will watch closely.  Levin Erp, MD 01/20/2023, 3:37 AM PGY-3, [place] [facility] Night Resident  Please page [phone] with questions.

## 2023-01-20 NOTE — Progress Notes (Signed)
Pt receives out-pt HD at [facility] on MWF 11:45 chair time. Will assist as needed.   Olivia Canter Renal Navigator [phone]

## 2023-01-20 NOTE — TOC Initial Note (Addendum)
Transition of Care [facility]) - Initial/Assessment Note    Patient Details  Name: Ridhaan Tindol MRN: [id number] Date of Birth: 08/31/1970  [facility] CM/SW Contact:    Delilah Shan, LCSWA Phone Number: 01/20/2023, 10:35 AM  Clinical Narrative:            Update-11:12am- Crystal with [facility] returned CSW call. Crystal confirmed patient comes from facility long term. Crystal confirmed patient can return when medically ready for dc. CSW will continue to follow.  Update-10:53am- CSW received call from patients cousin Dois Davenport. Dois Davenport confirmed patient comes from [place] long term. Dois Davenport confirmed plan is for patient to return to [place] when medically ready for dc. CSW called and LVM for Crystal with [employer]. CSW awaiting call back. CSW will continue to follow.        Due to patients current orientation, CSW called patients cousin Dois Davenport and LVM. CSW awaiting call back. CSW will continue to follow and assist with patients dc planning needs.       Patient Goals and CMS Choice            Expected Discharge Plan and Services                                              Prior Living Arrangements/Services                       Activities of Daily Living      Permission Sought/Granted                  Emotional Assessment              Admission diagnosis:  Lethargy [R53.83] Hypoxia [R09.02] Acute on chronic congestive heart failure, unspecified heart failure type (HCC) [I50.9] Acute hypoxic respiratory failure (HCC) [J96.01] Patient Active Problem List   Diagnosis Date Noted   MRSA (methicillin resistant Staphylococcus aureus) nasal colonization 12/26/2022   Loculated pleural effusion 12/26/2022   Pneumonia 12/25/2022   Acute metabolic encephalopathy 06/13/2022   Acute encephalopathy 06/12/2022   Pleural effusion, bilateral 06/12/2022   Hyperkalemia 06/12/2022   Chronic respiratory failure with hypoxia (HCC)  06/12/2022   Generalized weakness 11/05/2021   Pain due to onychomycosis of toenails of both feet 07/31/2021   Skin lesion 07/31/2021   History of thoracentesis    Acute hypoxic respiratory failure (HCC) 03/26/2021   Pressure injury of skin 11/06/2019   ESRD on dialysis [facility])    Heart failure with preserved ejection fraction (HCC)    Goals of care, counseling/discussion    Weakness 10/05/2019   Vision loss of left eye 09/21/2019   Homeless 09/19/2019   Anemia of chronic disease 09/19/2019   Vision loss, left eye 09/19/2019   Acute loss of vision, right 06/16/2019   Alcohol use    Gastroparesis due to DM (HCC)    Essential hypertension    Hyperlipidemia    PCP:  Karna Dupes, MD Pharmacy:   [facility] - [place], [place] - [address] [place] [address] Phone: [phone] Fax: [phone]     Social Determinants of Health (SDOH) Social History: SDOH Screenings   Food Insecurity: No Food Insecurity (12/26/2022)  Housing: Low Risk  (12/26/2022)  Transportation Needs: No Transportation Needs (12/26/2022)  Utilities: Not At Risk (12/26/2022)  Tobacco Use: Medium Risk (12/31/2022)  SDOH Interventions:     Readmission Risk Interventions    04/06/2021    1:01 PM  Readmission Risk Prevention Plan  Transportation Screening Complete  Medication Review (RN Care Manager) Complete  PCP or Specialist appointment within 3-5 days of discharge Complete  HRI or Home Care Consult Complete  SW Recovery Care/Counseling Consult Complete  Palliative Care Screening Not Applicable  Skilled Nursing Facility Complete

## 2023-01-20 NOTE — Progress Notes (Signed)
Pt refused vitals, yelling, cursing, pulling at cords & screaming "they are trying to kill me" while throwing his belongings. On call provider paged & requested to come to the bedside.

## 2023-01-20 NOTE — Progress Notes (Signed)
Daily Progress Note Intern Pager: [phone]  Patient name: Christopher Davila Medical record number: [id number] Date of birth: [birth year] Age: 52 y.o. Gender: male  Primary Care Provider: Karna Dupes, MD Consultants: Nephrology  Code Status: Full Code   Pt Overview and Major Events to Date:  8/4 Admission, nephrology consult, HD  8/5 Continue HD   Assessment and Plan: Christopher Davila is a 52 y.o. male presenting with increased somnolence and encephalopathy. PMHx of CHF, ESRD on HD MWF and chronic respiratory failure on 3L at SNF. The patient received hemodialysis last night, where 5L were removed. Patient will get additional hemodialysis this afternoon per nephrology. Lethargy and altered mental status improved this morning and arousable to verbal stimuli. Presumed to be metabolic and we are holding potentially sedating medications. He continues to require an increased oxygen requirement possibly in the setting of bilateral pleural effusions on x-ray and lower extremity swelling on physical exam.   [facility]     * (Principal) Acute hypoxic respiratory failure (HCC)     On 5L [place], up from 3L documented at home. Appears to be grossly volume  overloaded with 3+ pitting edema to BLE. Has both ESRD and CHF, recent  echo w/ EF 60-65% w/ Grade 1 diastolic dysfunction. Makes urine, so given  80mg  IV Lasix in the ED. Is on torsemide 100mg  BID at his SNF. Continues  to be volume overload and sating on 5L in the room.  - Progressive floor, Inpatient status - Have consulted nephrology for HD as I doubt we'll be able to adequately  diurese him with just loop diuretics, appreciate their assistance  - No ischemic changes appreciated on EKG - Monitor UOP and redose loop diuretic as able. IV for now until his  mental status improves - Strict I/O and daily weights         Essential hypertension     Elevated BP on admission. 150-180s/90s most recently. Perhaps volume   overload is contributing here? Will start home medications, given  improvement with mentation - Diuresis and HD as above  - Home amlodipine 5mg  on HD days, 10mg  on non-HD days - Home Losartan 100mg  daily - Home hydral 25mg  TID - Home metolazone 5mg  daily          ESRD on dialysis [facility])     HD MWF, most recently Friday - nephrology consulted, s/p 5L HD last night, will have repeat HD this  afternoon - Continue home phos binders         Heart failure with preserved ejection fraction [facility])     Patient has history, most recent ECHO October 2022, EF 55-60%, with  only mildly dilated right and left atrium. Admit BNP 890, CXR b/l pleural  effusions, given IV lasix 80 mg in ED - Repeat ECHO w/ LV EF 60-65% w/ grade I diastolic dysfunction and  severely dilated right atrium, moderate tricuspid regurg   - losartan 100 mg daily          Acute encephalopathy     Drowsy but awakens to noxious stimuli and can answer questions  appropriately. Unclear etiology. He was hypoxic on arrival but not to the  degree I would expect to cause these symptoms. Perhaps this is metabolic?  Could he have had incomplete dialysis on Friday causing volume overload  and metabolic encephalopathy? VBG without evidence of hypercarbia. Looking  back at past admissions it does seem that he is frequently encephalopathic  on arrival but improves with treatment of his underlying condition.  Encephalopathy improved after first round of dialysis, will require  additional round. Still has some lethargy, however awakens w/ verbal  stimuli and will speak w/ delayed responses.  - CT head negative for acute process - GCS improved this morning - Monitor mental status post-HD.  - Holding pregabalin and narcotics         Loculated pleural effusion   FEN/GI: NPO PPx:  Dispo:SNF in 2-3 days. Barriers include clinical improvement.   Subjective:  The patient has a desire to pee this morning. Reports back pain. Takes a  while to answer questions but wants dialysis.   Objective: Temp:  [97.4 F (36.3 C)-99.1 F (37.3 C)] 97.4 F (36.3 C) (08/05 1426) Pulse Rate:  [86-100] 97 (08/05 1500) Resp:  [14-21] 19 (08/05 1500) BP: (149-195)/(91-145) 159/94 (08/05 1500) SpO2:  [90 %-98 %] 98 % (08/05 1500) Weight:  [134 lb 0.6 oz (60.8 kg)-145 lb 8.1 oz (66 kg)] 134 lb 0.6 oz (60.8 kg) (08/05 1426)   Physical Exam Constitutional:      Appearance: He is not ill-appearing or toxic-appearing.  HENT:     Head: Normocephalic.  Eyes:     Comments:  Blind - opacified corneas  Pulmonary:     Breath sounds: No wheezing.  Abdominal:     General: Abdomen is flat. Bowel sounds are normal.     Tenderness: There is no abdominal tenderness.  Musculoskeletal:     Right lower leg: Edema present.     Left lower leg: Edema present.  Skin:    Coloration: Skin is not jaundiced.  Neurological:     Mental Status: He is lethargic and disoriented.     GCS: GCS eye subscore is 3. GCS verbal subscore is 5. GCS motor subscore is 6.     Comments: Oriented to situation and name, still mildly lethargic, much improved from prio  Psychiatric:        Mood and Affect: Mood normal.        Behavior: Behavior normal.     Laboratory: Most recent CBC Lab Results  Component Value Date   WBC 11.1 (H) 01/20/2023   HGB 11.5 (L) 01/20/2023   HCT 36.2 (L) 01/20/2023   MCV 91.9 01/20/2023   PLT 159 01/20/2023   Most recent BMP    Latest Ref Rng & Units 01/20/2023    4:35 AM  BMP  Glucose 70 - 99 mg/dL 78   BUN 6 - 20 mg/dL 22   Creatinine 2.53 - 1.24 mg/dL 6.64   Sodium 403 - 474 mmol/L 133   Potassium 3.5 - 5.1 mmol/L 4.7   Chloride 98 - 111 mmol/L 92   CO2 22 - 32 mmol/L 23   Calcium 8.9 - 10.3 mg/dL 9.4    Imaging/Diagnostic Tests:  Imaging Study (ie. Chest x-ray) Impression from Radiologist: CHF pattern including pleural effusions.     My Interpretation: Diffuse haziness, Pleural effusions bilaterally R>L   CT: No  acute intracranial process    Peterson Ao, MD 01/20/2023, 3:16 PM  PGY-1, [facility] Psychiatry Resident  FPTS Intern pager: [phone], text pages welcome Secure chat group [facility] Teaching Service

## 2023-01-20 NOTE — Progress Notes (Signed)
Received patient in bed to unit.  Alert and oriented x3.  Informed consent signed and in chart.   TX duration:4 hours  Patient tolerated well.  Transported back to the room  Alert, without acute distress.  Hand-off given to patient's nurse.   Access used: fistula Access issues: none  Total UF removed: 5000 ml Medication(s) given: none Post HD VS: 162/91 Post HD weight: 60.8 kg    01/20/23 0235  Vitals  Temp 98 F (36.7 C)  Temp Source Oral  BP (!) 162/91  MAP (mmHg) 113  BP Location Left Arm  BP Method Automatic  Patient Position (if appropriate) Lying  Pulse Rate 93  Pulse Rate Source Monitor  ECG Heart Rate 93  Resp 15  Oxygen Therapy  SpO2 97 %  O2 Device Nasal Cannula  Patient Activity (if Appropriate) In bed  Pulse Oximetry Type Continuous  Post Treatment  Dialyzer Clearance Lightly streaked  Duration of HD Treatment -hour(s) 4 hour(s)  Hemodialysis Intake (mL) 0 mL  Liters Processed 96  Fluid Removed (mL) 5000 mL  Tolerated HD Treatment Yes  Post-Hemodialysis Comments HD tx completed as expected, tolerated well, no complaints.  pt is stable.  AVG/AVF Arterial Site Held (minutes) 10 minutes  AVG/AVF Venous Site Held (minutes) 10 minutes  Note  Patient Observations pt is in bed restting  Fistula / Graft Right Upper arm Arteriovenous fistula  Placement Date/Time: 10/29/19 1319   Placed prior to admission: No  Orientation: Right  Access Location: Upper arm  Access Type: (c) Arteriovenous fistula  Site Condition No complications  Fistula / Graft Assessment Present;Thrill;Bruit  Status Deaccessed  Drainage Description None

## 2023-01-20 NOTE — Progress Notes (Signed)
Called to patient bedside by nurse due to patient agitation.  Arrived to room patient was yelling, cursing and throwing phone/other items at nursing staff.  Patient was on the phone with his cousin, Dois Davenport, asking her to come pick him up and take him home.  Dois Davenport stated that the pt needs to listen to the doctors and stay in the hospital and that she would not pick him up.  Speaking with the patient,  he is able to correctly identify the day of the week and the year.  He is aware that he is in the hospital however he is displaying paranoia, stating "you all are trying to kill me" and accusing medical staff of giving him injections and laughing at him. Attempted to redirect the pt several times with minimal success. Ordered regular diet in hopes this might help calm the patient. Also ordered one time haldol 0.5mg  to be used if pt continues to be agitated. Will continue to monitor.

## 2023-01-20 NOTE — Assessment & Plan Note (Addendum)
HD MWF, most recently Friday - nephrology consulted, s/p 5L HD last night, will have repeat HD this afternoon - Continue home phos binders

## 2023-01-20 NOTE — Progress Notes (Signed)
Attempted to get more history regarding patient's baseline mental status as well as his wishes regarding dialysis/palliative care/hospice in addition to who he would like to be his healthcare power of attorney.  Patient says that his daughter makes his decisions for him.  He was tearful and said he wanted to meet his sister multiple times.  Upon asking where his sister is or lives he says near the [place].  Attempted to call at [facility] care palliative care twice at the phone number that was listed in previous notes without response.  Was not able to find daughter's phone number within any of the notes or emergency contact information.  Called Ashley Royalty however was not able to get in contact with her.  Called the other cousin that was listed in emergency contacts, Ollis Wemhoff.  She picked up the phone and told me that she is the healthcare power of attorney for patient and has this paperwork at his ALF.  She says that she was never told that patient's daughter had healthcare power of attorney and has not heard from her in many years.  Dois Davenport says that she has been making decisions for the patient since he has been in the ALF.  Dois Davenport agrees that patient stated that he would want to be full code even before patient had altered mental status during this current episode.  She asked what the cause of altered mental status could be and I explained that could be multifactorial but is possibly affected by patient having truncated dialysis sessions.  She states that she thought patient was going to all of his dialysis sessions and would not know why patient had been leaving dialysis sessions early.  States that she does not feel comfortable making decisions regarding stopping patient's dialysis or making him DNR as patient had always said that he had wanted full care and full code.  She states that this behavior of leaving dialysis sessions early is very different to what patient had always told her regarding his  wishes that she would want to speak with patient before making any changes to his medical decisions at this time.  Hassaan Roudebush said that she would come visit patient soon.  She also informed me that patient's sister has died many years previously.  She said she did not know phone number of patient's daughter.  Patient has been in hospice care previously but most recently was part of palliative care.  He was not part of hospice care.

## 2023-01-20 NOTE — Assessment & Plan Note (Signed)
Patient has history, most recent ECHO October 2022, EF 55-60%, with only mildly dilated right and left atrium. Admit BNP 890, CXR b/l pleural effusions, given IV lasix 80 mg in ED - Repeat ECHO w/ LV EF 60-65% w/ grade I diastolic dysfunction and severely dilated right atrium, moderate tricuspid regurg   - losartan 100 mg daily

## 2023-01-20 NOTE — Progress Notes (Signed)
[facility] Progress Note   Subjective:    Patient received HD overnight and 5L was removed. Seen and examined at bedside. Appears more awake but still confused. Oriented to himself currently. Looks like he's getting closer to his EDW after last night's HD. HD already scheduled for today-this can get done later this afternoon or evening.  Objective Vitals:   01/20/23 0235 01/20/23 0344 01/20/23 0349 01/20/23 0746  BP: (!) 162/91 (!) 156/92  (!) 166/91  Pulse: 93 97    Resp: 15 19    Temp: 98 F (36.7 C) 98.8 F (37.1 C)  99.1 F (37.3 C)  TempSrc: Oral Oral  Oral  SpO2: 97% 94%    Weight:   60.8 kg   Height:       Physical Exam General: Chronically-ill appearing; lethargic bit responds to voice; on 5L ; alert to himself only Heart: S1 and S2; No MRGs Lungs: Clear anteriorly; breathing unlabored Abdomen: Soft and non-tender Extremities: 1-2+ LE edema Dialysis Access: AVF (+) B/T   Filed Weights   01/19/23 0659 01/19/23 2200 01/20/23 0349  Weight: 63.5 kg 66 kg 60.8 kg    Intake/Output Summary (Last 24 hours) at 01/20/2023 1128 Last data filed at 01/20/2023 0359 Gross per 24 hour  Intake --  Output 5300 ml  Net -5300 ml    Additional Objective Labs: Basic Metabolic Panel: Recent Labs  Lab 01/19/23 0741 01/19/23 0747 01/20/23 0435  NA 138 139 133*  K 4.0 4.0 4.7  CL 96*  --  92*  CO2 24  --  23  GLUCOSE 115*  --  78  BUN 46*  --  22*  CREATININE 7.11*  --  4.01*  CALCIUM 9.3  --  9.4  PHOS  --   --  3.6   Liver Function Tests: Recent Labs  Lab 01/19/23 0741 01/20/23 0435  AST 20  --   ALT 22  --   ALKPHOS 120  --   BILITOT 0.9  --   PROT 7.7  --   ALBUMIN 3.4* 3.6   No results for input(s): "LIPASE", "AMYLASE" in the last 168 hours. CBC: Recent Labs  Lab 01/19/23 0741 01/19/23 0747  WBC 10.7*  --   NEUTROABS 9.1*  --   HGB 11.5* 12.9*  HCT 36.9* 38.0*  MCV 93.9  --   PLT 146*  --    Blood Culture    Component Value  Date/Time   SDES BLOOD LEFT WRIST 12/25/2022 2115   SPECREQUEST  12/25/2022 2115    BOTTLES DRAWN AEROBIC AND ANAEROBIC Blood Culture adequate volume   CULT  12/25/2022 2115    NO GROWTH 5 DAYS Performed at [facility], [address] [place], [place] [address]    REPTSTATUS 12/30/2022 FINAL 12/25/2022 2115    Cardiac Enzymes: No results for input(s): "CKTOTAL", "CKMB", "CKMBINDEX", "TROPONINI" in the last 168 hours. CBG: Recent Labs  Lab 01/19/23 0816 01/20/23 0328 01/20/23 0744  GLUCAP 99 86 91   Iron Studies: No results for input(s): "IRON", "TIBC", "TRANSFERRIN", "FERRITIN" in the last 72 hours. Lab Results  Component Value Date   INR 1.1 05/07/2022   INR 1.1 10/13/2019   INR 1.0 06/16/2019   Studies/Results: ECHOCARDIOGRAM COMPLETE  Result Date: 01/19/2023    ECHOCARDIOGRAM REPORT   Patient Name:   Christopher Davila Date of Exam: 01/19/2023 Medical Rec #:  [id number]            Height:  65.0 in Accession #:    [id number]           Weight:       140.0 lb Date of Birth:  [birth year]            BSA:          1.700 m Patient Age:    52 years             BP:           159/110 mmHg Patient Gender: M                    HR:           90 bpm. Exam Location:  Inpatient Procedure: 2D Echo, Cardiac Doppler and Color Doppler Indications:    Dyspnea R06.00  History:        Patient has prior history of Echocardiogram examinations, most                 recent 03/28/2011. CHF; Risk Factors:Hypertension. ESRD on                 dialysis [facility]).  Sonographer:    Celesta Gentile RCS Referring Phys: Alicia Amel IMPRESSIONS  1. Left ventricular ejection fraction, by estimation, is 60 to 65%. The left ventricle has normal function. The left ventricle has no regional wall motion abnormalities. Left ventricular diastolic parameters are consistent with Grade I diastolic dysfunction (impaired relaxation).  2. Right ventricular systolic function is low normal. The right ventricular size is  normal.  3. Right atrial size was severely dilated.  4. The mitral valve is normal in structure. No evidence of mitral valve regurgitation. No evidence of mitral stenosis.  5. The tricuspid valve is abnormal. Tricuspid valve regurgitation is moderate.  6. The aortic valve has an indeterminant number of cusps. Aortic valve regurgitation is not visualized. No aortic stenosis is present.  7. The inferior vena cava is normal in size with greater than 50% respiratory variability, suggesting right atrial pressure of 3 mmHg. Comparison(s): No significant change from prior study. FINDINGS  Left Ventricle: Left ventricular ejection fraction, by estimation, is 60 to 65%. The left ventricle has normal function. The left ventricle has no regional wall motion abnormalities. The left ventricular internal cavity size was normal in size. There is  no left ventricular hypertrophy. Left ventricular diastolic parameters are consistent with Grade I diastolic dysfunction (impaired relaxation). Right Ventricle: The right ventricular size is normal. No increase in right ventricular wall thickness. Right ventricular systolic function is low normal. Left Atrium: Left atrial size was normal in size. Right Atrium: Right atrial size was severely dilated. Pericardium: There is no evidence of pericardial effusion. Mitral Valve: The mitral valve is normal in structure. No evidence of mitral valve regurgitation. No evidence of mitral valve stenosis. Tricuspid Valve: The tricuspid valve is abnormal. Tricuspid valve regurgitation is moderate . No evidence of tricuspid stenosis. Aortic Valve: The aortic valve has an indeterminant number of cusps. Aortic valve regurgitation is not visualized. No aortic stenosis is present. Pulmonic Valve: The pulmonic valve was normal in structure. Pulmonic valve regurgitation is not visualized. No evidence of pulmonic stenosis. Aorta: The aortic root is normal in size and structure. Venous: The inferior vena cava is  normal in size with greater than 50% respiratory variability, suggesting right atrial pressure of 3 mmHg. IAS/Shunts: No atrial level shunt detected by color flow Doppler.  LEFT VENTRICLE PLAX 2D LVIDd:  4.30 cm   Diastology LVIDs:         2.90 cm   LV e' medial:    5.55 cm/s LV PW:         1.20 cm   LV E/e' medial:  17.0 LV IVS:        1.10 cm   LV e' lateral:   9.57 cm/s LVOT diam:     1.90 cm   LV E/e' lateral: 9.9 LV SV:         64 LV SV Index:   37 LVOT Area:     2.84 cm  RIGHT VENTRICLE RV S prime:     13.80 cm/s TAPSE (M-mode): 1.8 cm LEFT ATRIUM             Index        RIGHT ATRIUM           Index LA diam:        3.10 cm 1.82 cm/m   RA Area:     21.50 cm LA Vol (A2C):   63.6 ml 37.41 ml/m  RA Volume:   81.20 ml  47.77 ml/m LA Vol (A4C):   38.3 ml 22.53 ml/m LA Biplane Vol: 49.9 ml 29.35 ml/m  AORTIC VALVE LVOT Vmax:   126.00 cm/s LVOT Vmean:  84.700 cm/s LVOT VTI:    0.224 m  AORTA Ao Root diam: 2.80 cm MITRAL VALVE                TRICUSPID VALVE MV Area (PHT): 2.96 cm     TR Peak grad:   49.8 mmHg MV Decel Time: 256 msec     TR Vmax:        353.00 cm/s MV E velocity: 94.30 cm/s MV A velocity: 122.00 cm/s  SHUNTS MV E/A ratio:  0.77         Systemic VTI:  0.22 m                             Systemic Diam: 1.90 cm Vishnu Priya Mallipeddi Electronically signed by Winfield Rast Mallipeddi Signature Date/Time: 01/19/2023/6:16:56 PM    Final    CT HEAD WO CONTRAST ( )  Result Date: 01/19/2023 CLINICAL DATA:  Mental status change, unknown cause EXAM: CT HEAD WITHOUT CONTRAST TECHNIQUE: Contiguous axial images were obtained from the base of the skull through the vertex without intravenous contrast. RADIATION DOSE REDUCTION: This exam was performed according to the departmental dose-optimization program which includes automated exposure control, adjustment of the mA and/or kV according to patient size and/or use of iterative reconstruction technique. COMPARISON:  CT head 12/25/22 FINDINGS: Brain: No  evidence of acute infarction, hemorrhage, hydrocephalus, extra-axial collection or mass lesion/mass effect. Chronic infarcts in the left caudate head. Sequela of moderate chronic microvascular ischemic change. Vascular: No hyperdense vessel or unexpected calcification. Skull: Normal. Negative for fracture or focal lesion. Sinuses/Orbits: No middle ear or mastoid effusion. Polypoid mucosal thickening of the floor of the right maxillary sinus. 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 Redemonstrated likely intra-ocular blood products on the left. Other: None. IMPRESSION: No acute intracranial abnormality. Electronically Signed   By: Lorenza Cambridge M.D.   On: 01/19/2023 13:28   DG Chest Port 1 View  Result Date: 01/19/2023 CLINICAL DATA:  Hypoxia EXAM: PORTABLE CHEST 1 VIEW COMPARISON:  12/25/2022 FINDINGS: Right more than left pleural effusion with diffuse hazy appearance of the chest. Chronic cardiomegaly with vascular pedicle widening. IMPRESSION: CHF pattern  including pleural effusions. Electronically Signed   By: Tiburcio Pea M.D.   On: 01/19/2023 08:08    Medications:  sodium chloride 10 mL/hr at 01/20/23 0350   famotidine (PEPCID) IV 20 mg (01/20/23 0351)    [START ON 01/21/2023] amLODipine  10 mg Oral Once per day on Sunday Tuesday Thursday Saturday   amLODipine  5 mg Oral Once per day on Monday Wednesday Friday   aspirin EC  81 mg Oral Daily   brimonidine  1 drop Both Eyes Q12H   And   timolol  1 drop Both Eyes Q12H   calcium acetate  1,334 mg Oral TID WC   Chlorhexidine Gluconate Cloth  6 each Topical Q0600   doxercalciferol  6 mcg Intravenous Q M,W,F-HD   heparin  3,000 Units Dialysis Once in dialysis   heparin  5,000 Units Subcutaneous Q8H   hydrALAZINE  25 mg Oral TID   latanoprost  1 drop Both Eyes QHS   losartan  100 mg Oral Daily   melatonin  10 mg Oral QHS   metolazone  5 mg Oral Daily   mupirocin ointment  1 Application Nasal BID   torsemide  100 mg Oral BID    Dialysis Orders: [facility]  MWF 4 hrs 180NRe 400/600 60 kg 2.0 K/ 2.0 Ca AVF - Heparin 3000 units IV three times per week - Hectorol 6 mcg IV three times per week - Mircera 150 mcg IV q 2 weeks (last dose 01/03/2023 Dose was due 01/16/2023  Assessment/Plan:  Acute hypoxic respiratory failure in setting of volume overload:Received HD overnight for volume removal then continue serial HD for volume removal-already scheduled for HD again today (can get this done either later this afternoon/evening), attempt to optimize volume. Continue PO Torsemide-appears he's still making some urine.  AMS-patient extremely lethargic, Concern about his being able to protect his airway. PCM consulted per primary. No azotemia per labs. DC sedating medications. Higher than renal dose lyrica noted on PTA med list-medication stopped.  Muscle fasiculations: Rhythmic jerking noted UE. BUN not excessively elevated. Mostly likely related to lyrica-already stopped.    ESRD -  MWF Missed HD 01/17/2023. Truncates every treatment. Received HD overnight and scheduled again today.   Hypertension/volume  - BP elevated, overtly volume overloaded by exam and CXR. UF as tolerated to optimize volume  Anemia  - HGB 11.5 No ESA needed. Missed last dose at OP center. Follow HGB.   Metabolic bone disease -  Last PO4 6.6 01/06/2023 corrected calcium OK. Continue VDRA, calcium acetate binders.   Nutrition - NPO at present DMT2 -per primary  Salome Holmes, NP [facility] 01/20/2023,11:28 AM  LOS: 1 day

## 2023-01-20 NOTE — Assessment & Plan Note (Addendum)
Elevated BP on admission. 150-180s/90s most recently. Perhaps volume overload is contributing here? Will start home medications, given improvement with mentation - Diuresis and HD as above  - Home amlodipine 5mg  on HD days, 10mg  on non-HD days - Home Losartan 100mg  daily - Home hydral 25mg  TID - Home metolazone 5mg  daily

## 2023-01-20 NOTE — Progress Notes (Signed)
Holding giving PO meds until after lunch per Birdena Crandall, MD. Pt still has AMS but is NPO so unable to take food/ sips with meds. Swallow test discussed by MD but asked to have speech consult placed to perform test- waiting for MD to do rounds this afternoon for final decision.

## 2023-01-21 DIAGNOSIS — J9601 Acute respiratory failure with hypoxia: Secondary | ICD-10-CM | POA: Diagnosis not present

## 2023-01-21 LAB — HEPATITIS B SURFACE ANTIBODY, QUANTITATIVE: Hep B S AB Quant (Post): 402 m[IU]/mL

## 2023-01-21 LAB — GLUCOSE, CAPILLARY: Glucose-Capillary: 92 mg/dL (ref 70–99)

## 2023-01-21 MED ORDER — CHLORHEXIDINE GLUCONATE CLOTH 2 % EX PADS
6.0000 | MEDICATED_PAD | Freq: Every day | CUTANEOUS | Status: DC
  Administered 2023-01-21 (×2): 6 via TOPICAL

## 2023-01-21 MED ORDER — HALOPERIDOL LACTATE 5 MG/ML IJ SOLN: 0.5000 mg | Freq: Four times a day (QID) | INTRAMUSCULAR | Status: DC | PRN

## 2023-01-21 NOTE — Assessment & Plan Note (Signed)
Elevated BP on admission. 150-180s/90s most recently. Perhaps volume overload is contributing here? Home medications, given improvement with mentation, intermittently refusing some medications throughout morning shift - Diuresis and HD as above  - Home amlodipine 5mg  on HD days, 10mg  on non-HD days - Home Losartan 100mg  daily - Home hydral 25mg  TID - Home metolazone 5mg  daily

## 2023-01-21 NOTE — Progress Notes (Addendum)
[facility] Progress Note   Subjective:    Pt received HD 8/4 and 8/5. Tolerated yesterday's treatment with a net UF 4L. Noted patient had episode of combativeness/agitation after yesterday's HD. Discussed with bedside RN. Seen and examined him at bedside. He's sleeping and not answering any questions. Volume improving and getting closer to his EDW. BUN also improving now 22. Next HD scheduled for 8/7.  Objective Vitals:   01/20/23 1838 01/20/23 1900 01/21/23 0258 01/21/23 0759  BP: (!) 137/96 (!) 148/87 (!) 177/96 (!) 154/91  Pulse: 94 94 97   Resp: 20 13 15    Temp:  (!) 97.5 F (36.4 C) (!) 97 F (36.1 C) (!) 97 F (36.1 C)  TempSrc:   Axillary Oral  SpO2: 99% 99% 100% 92%  Weight:  56.8 kg 61.7 kg   Height:       Physical Exam General: Chronically-ill appearing; sleeping; on 5L [place]; not answering questions now Heart: S1 and S2; No MRGs Lungs: Clear anteriorly and laterally; breathing unlabored Abdomen: Soft and non-tender Extremities: No LE edema Dialysis Access: AVF (+) B/T     Filed Weights   01/20/23 1426 01/20/23 1900 01/21/23 0258  Weight: 60.8 kg 56.8 kg 61.7 kg    Intake/Output Summary (Last 24 hours) at 01/21/2023 1149 Last data filed at 01/20/2023 1900 Gross per 24 hour  Intake 84 ml  Output 4000 ml  Net -3916 ml    Additional Objective Labs: Basic Metabolic Panel: Recent Labs  Lab 01/19/23 0741 01/19/23 0747 01/20/23 0435  NA 138 139 133*  K 4.0 4.0 4.7  CL 96*  --  92*  CO2 24  --  23  GLUCOSE 115*  --  78  BUN 46*  --  22*  CREATININE 7.11*  --  4.01*  CALCIUM 9.3  --  9.4  PHOS  --   --  3.6   Liver Function Tests: Recent Labs  Lab 01/19/23 0741 01/20/23 0435  AST 20  --   ALT 22  --   ALKPHOS 120  --   BILITOT 0.9  --   PROT 7.7  --   ALBUMIN 3.4* 3.6   No results for input(s): "LIPASE", "AMYLASE" in the last 168 hours. CBC: Recent Labs  Lab 01/19/23 0741 01/19/23 0747 01/20/23 1309  WBC 10.7*  --  11.1*   NEUTROABS 9.1*  --   --   HGB 11.5* 12.9* 11.5*  HCT 36.9* 38.0* 36.2*  MCV 93.9  --  91.9  PLT 146*  --  159   Blood Culture    Component Value Date/Time   SDES BLOOD LEFT WRIST 12/25/2022 2115   SPECREQUEST  12/25/2022 2115    BOTTLES DRAWN AEROBIC AND ANAEROBIC Blood Culture adequate volume   CULT  12/25/2022 2115    NO GROWTH 5 DAYS Performed at [facility], [address] [place], [place] [address]    REPTSTATUS 12/30/2022 FINAL 12/25/2022 2115    Cardiac Enzymes: No results for input(s): "CKTOTAL", "CKMB", "CKMBINDEX", "TROPONINI" in the last 168 hours. CBG: Recent Labs  Lab 01/19/23 0816 01/20/23 0328 01/20/23 0744 01/20/23 1219  GLUCAP 99 86 91 93   Iron Studies: No results for input(s): "IRON", "TIBC", "TRANSFERRIN", "FERRITIN" in the last 72 hours. Lab Results  Component Value Date   INR 1.1 05/07/2022   INR 1.1 10/13/2019   INR 1.0 06/16/2019   Studies/Results: ECHOCARDIOGRAM COMPLETE  Result Date: 01/19/2023    ECHOCARDIOGRAM REPORT   Patient Name:  Christopher Davila Date of Exam: 01/19/2023 Medical Rec #:  [id number]            Height:       65.0 in Accession #:    [id number]           Weight:       140.0 lb Date of Birth:  01-13-1971            BSA:          1.700 m Patient Age:    51 years             BP:           159/110 mmHg Patient Gender: M                    HR:           90 bpm. Exam Location:  Inpatient Procedure: 2D Echo, Cardiac Doppler and Color Doppler Indications:    Dyspnea R06.00  History:        Patient has prior history of Echocardiogram examinations, most                 recent 03/28/2011. CHF; Risk Factors:Hypertension. ESRD on                 dialysis [facility]).  Sonographer:    Celesta Gentile RCS Referring Phys: Alicia Amel IMPRESSIONS  1. Left ventricular ejection fraction, by estimation, is 60 to 65%. The left ventricle has normal function. The left ventricle has no regional wall motion abnormalities. Left ventricular diastolic  parameters are consistent with Grade I diastolic dysfunction (impaired relaxation).  2. Right ventricular systolic function is low normal. The right ventricular size is normal.  3. Right atrial size was severely dilated.  4. The mitral valve is normal in structure. No evidence of mitral valve regurgitation. No evidence of mitral stenosis.  5. The tricuspid valve is abnormal. Tricuspid valve regurgitation is moderate.  6. The aortic valve has an indeterminant number of cusps. Aortic valve regurgitation is not visualized. No aortic stenosis is present.  7. The inferior vena cava is normal in size with greater than 50% respiratory variability, suggesting right atrial pressure of 3 mmHg. Comparison(s): No significant change from prior study. FINDINGS  Left Ventricle: Left ventricular ejection fraction, by estimation, is 60 to 65%. The left ventricle has normal function. The left ventricle has no regional wall motion abnormalities. The left ventricular internal cavity size was normal in size. There is  no left ventricular hypertrophy. Left ventricular diastolic parameters are consistent with Grade I diastolic dysfunction (impaired relaxation). Right Ventricle: The right ventricular size is normal. No increase in right ventricular wall thickness. Right ventricular systolic function is low normal. Left Atrium: Left atrial size was normal in size. Right Atrium: Right atrial size was severely dilated. Pericardium: There is no evidence of pericardial effusion. Mitral Valve: The mitral valve is normal in structure. No evidence of mitral valve regurgitation. No evidence of mitral valve stenosis. Tricuspid Valve: The tricuspid valve is abnormal. Tricuspid valve regurgitation is moderate . No evidence of tricuspid stenosis. Aortic Valve: The aortic valve has an indeterminant number of cusps. Aortic valve regurgitation is not visualized. No aortic stenosis is present. Pulmonic Valve: The pulmonic valve was normal in structure.  Pulmonic valve regurgitation is not visualized. No evidence of pulmonic stenosis. Aorta: The aortic root is normal in size and structure. Venous: The inferior vena cava is normal in size with greater than  50% respiratory variability, suggesting right atrial pressure of 3 mmHg. IAS/Shunts: No atrial level shunt detected by color flow Doppler.  LEFT VENTRICLE PLAX 2D LVIDd:         4.30 cm   Diastology LVIDs:         2.90 cm   LV e' medial:    5.55 cm/s LV PW:         1.20 cm   LV E/e' medial:  17.0 LV IVS:        1.10 cm   LV e' lateral:   9.57 cm/s LVOT diam:     1.90 cm   LV E/e' lateral: 9.9 LV SV:         64 LV SV Index:   37 LVOT Area:     2.84 cm  RIGHT VENTRICLE RV S prime:     13.80 cm/s TAPSE (M-mode): 1.8 cm LEFT ATRIUM             Index        RIGHT ATRIUM           Index LA diam:        3.10 cm 1.82 cm/m   RA Area:     21.50 cm LA Vol (A2C):   63.6 ml 37.41 ml/m  RA Volume:   81.20 ml  47.77 ml/m LA Vol (A4C):   38.3 ml 22.53 ml/m LA Biplane Vol: 49.9 ml 29.35 ml/m  AORTIC VALVE LVOT Vmax:   126.00 cm/s LVOT Vmean:  84.700 cm/s LVOT VTI:    0.224 m  AORTA Ao Root diam: 2.80 cm MITRAL VALVE                TRICUSPID VALVE MV Area (PHT): 2.96 cm     TR Peak grad:   49.8 mmHg MV Decel Time: 256 msec     TR Vmax:        353.00 cm/s MV E velocity: 94.30 cm/s MV A velocity: 122.00 cm/s  SHUNTS MV E/A ratio:  0.77         Systemic VTI:  0.22 m                             Systemic Diam: 1.90 cm Vishnu Priya Mallipeddi Electronically signed by Winfield Rast Mallipeddi Signature Date/Time: 01/19/2023/6:16:56 PM    Final    CT HEAD WO CONTRAST ( )  Result Date: 01/19/2023 CLINICAL DATA:  Mental status change, unknown cause EXAM: CT HEAD WITHOUT CONTRAST TECHNIQUE: Contiguous axial images were obtained from the base of the skull through the vertex without intravenous contrast. RADIATION DOSE REDUCTION: This exam was performed according to the departmental dose-optimization program which includes automated  exposure control, adjustment of the mA and/or kV according to patient size and/or use of iterative reconstruction technique. COMPARISON:  CT head 12/25/22 FINDINGS: Brain: No evidence of acute infarction, hemorrhage, hydrocephalus, extra-axial collection or mass lesion/mass effect. Chronic infarcts in the left caudate head. Sequela of moderate chronic microvascular ischemic change. Vascular: No hyperdense vessel or unexpected calcification. Skull: Normal. Negative for fracture or focal lesion. Sinuses/Orbits: No middle ear or mastoid effusion. Polypoid mucosal thickening of the floor of the right maxillary sinus. 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 Redemonstrated likely intra-ocular blood products on the left. Other: None. IMPRESSION: No acute intracranial abnormality. Electronically Signed   By: Lorenza Cambridge M.D.   On: 01/19/2023 13:28    Medications:  sodium chloride Stopped (01/20/23 0557)  famotidine (PEPCID) IV Stopped (01/20/23 0434)    amLODipine  10 mg Oral Once per day on Sunday Tuesday Thursday Saturday   amLODipine  5 mg Oral Once per day on Monday Wednesday Friday   aspirin EC  81 mg Oral Daily   brimonidine  1 drop Both Eyes Q12H   And   timolol  1 drop Both Eyes Q12H   calcium acetate  1,334 mg Oral TID WC   Chlorhexidine Gluconate Cloth  6 each Topical Q0600   doxercalciferol  6 mcg Intravenous Q M,W,F-HD   heparin  5,000 Units Subcutaneous Q8H   hydrALAZINE  25 mg Oral TID   latanoprost  1 drop Both Eyes QHS   losartan  100 mg Oral Daily   melatonin  10 mg Oral QHS   metolazone  5 mg Oral Daily   mupirocin ointment  1 Application Nasal BID   torsemide  100 mg Oral BID    Dialysis Orders: [facility] MWF 4 hrs 180NRe 400/600 60 kg 2.0 K/ 2.0 Ca AVF - Heparin 3000 units IV three times per week - Hectorol 6 mcg IV three times per week - Mircera 150 mcg IV q 2 weeks (last dose 01/03/2023 Dose was due 08/01/202  Assessment/Plan:  Acute hypoxic respiratory failure in setting of volume  overload:Received HD 8/4 and 8/5, attempt to optimize volume. Continue PO Torsemide-appears he's still making some urine. Will repeat another CXR tomorrow morning to re-assess volume status.  AMS-patient extremely lethargic at admission. Had an episode of combativeness/agitation overnight. Pt now sleeping. PCM consulted per primary, per bedside RN, holding off on Psych consult at this time. No azotemia per labs. Sedating medications stopped including Lyrica.  Muscle fasiculations: Rhythmic jerking noted UE. BUN improving now 22. Mostly likely related to lyrica-already stopped.    ESRD -  MWF Missed HD 01/17/2023. Truncates every treatment in outpatient. Received HD 8/4 (5L off) and 8/5 (4L off). Volume is now improving and getting closer to EDW. Next HD 8/7  Hypertension/volume  - BP elevated, overtly volume overloaded by exam and CXR. Push UF as tolerated to optimize volume  Anemia  - HGB 11.5 No ESA needed. Missed last dose at OP center. Follow HGB.   Metabolic bone disease -  Last PO4 6.6 01/06/2023 corrected calcium OK. Continue VDRA, calcium acetate binders.   Nutrition - NPO at present DMT2 -per primary  Salome Holmes, NP [facility] 01/21/2023,11:49 AM  LOS: 2 days

## 2023-01-21 NOTE — Assessment & Plan Note (Signed)
Patient more agitated/combative overnight after dialysis session yesterday evening. Nephrology following, and planning for HD tomorrow w/ repeat CXR to reassess volume status  HD MWF, most recently Friday - nephrology consulted, s/p 5L HD on 8/4 and 4L on 8/5  - Continue home phos binders

## 2023-01-21 NOTE — Progress Notes (Addendum)
FMTS Brief Progress Note  S: Saw patient during night rounds with Dr. Dolan Amen.  On arrival patient was sitting up at bedside eating dinner.  Patient said he is doing fine has no concerns.  Had few questions about when he will be discharged and why he was brought to the hospital.  During our conversation explained he was admitted due to shortness of breath that was most likely due to him prematurely leaving his dialysis center before completing his HD leading to fluid build up.  Patient expressed the reason he leaves prematurely from dialysis is due to the scheduled time his driver is supposed to pick him up.  Denies having any shortness of breath or chest pain. He feels like he is much improved overall.   O: BP (!) 144/75 (BP Location: Left Arm)   Pulse 91   Temp 98.7 F (37.1 C) (Oral)   Resp 16   Ht 5\' 5"  (1.651 m)   Wt 61.7 kg   SpO2 99%   BMI 22.64 kg/m    General: Alert, bilaterally blind, NAD CV: RRR, well-perfused Pulm: CTAB, good WOB on RA, no crackles or wheezing Abd: Soft, no distension, no tenderness Psych: Pleasant, apparent response and mood   A/P: Overall patient appears to have made significant improvement.  Denies any dyspnea and has good work of breathing on room air.  Patient seems to have turned the corner after multiple dialysis with adequate fluid removed. -HD tomorrow per nephrology - Orders reviewed. Labs for AM ordered, which was adjusted as needed.    Mental status Patient seems to be at baseline and pleasant. -As needed Haldol for agitation or delirium.   Jerre Simon, MD 01/21/2023, 10:28 PM PGY-3, [facility] Night Resident  Please page [phone] with questions.

## 2023-01-21 NOTE — Progress Notes (Signed)
Admission documentation unable to be completed d/t pt's AMS.

## 2023-01-21 NOTE — Progress Notes (Signed)
Pt continuing to refuse vitals, assessment & medications.

## 2023-01-21 NOTE — Progress Notes (Addendum)
Pt continuing to yell to the point that other pt's were voicing their concerns. When staff entered room to assess needs, pt balled up fist, swung at staff & drink intentionally knocked off bedside table. Security called to bedside & 1 time dose of Haldol administered.

## 2023-01-21 NOTE — Progress Notes (Addendum)
Daily Progress Note Intern Pager: [phone]  Patient name: Christopher Davila Medical record number: [id number] Date of birth: Dec 22, 1970 Age: 52 y.o. Gender: male  Primary Care Provider: Karna Dupes, MD Consultants: Nephrology  Code Status: Full code, discussed with HCPOA yesterday, Deny Anzualda [phone]  Pt Overview and Major Events to Date:  8/4 Admission, nephrology consult, round 1 HD  8/5 Discussed full code status w/ HCPOA, Round 2 of HD, patient w/ increased agitation/combative overnight, 0.5 Haldol x1  8/6 Patient refusing some medications today, will discuss GOC w/ HCPOA today  Assessment and Plan: [name] is a 52 y.o. male presenting with increased somnolence and encephalopathy. PMHx of CHF, ESRD on HD MWF and chronic respiratory failure on 3L at SNF. The patient received hemodialysis last night, where 4L were removed. Patient more agitated/combative with nursing staff after HD yesterday afternoon and given Haldol x1. Patient continues to refuse medications this morning, will discuss GC with HCPOA. Patient has palliative services arranged outpatient. Nephrology planning for HD tomorrow and repeat CXR.  He continues to require an increased oxygen requirement possibly in the setting of bilateral pleural effusions on x-ray and lower extremity swelling on physical exam.   Christopher Davila     * (Principal) Acute hypoxic respiratory failure (HCC)     On 5L [place], up from 3L documented at home. Appears to be grossly volume  overloaded with 3+ pitting edema to BLE. Has both ESRD and CHF, recent  echo w/ EF 60-65% w/ Grade 1 diastolic dysfunction. Makes urine, so given  80mg  IV Lasix in the ED. Is on torsemide 100mg  BID at his SNF. Continues  to be volume overload and sating on 5L in the room.  - Progressive floor, Inpatient status - Have consulted nephrology for HD, will repeat CXR tomorrow   - No ischemic changes appreciated on EKG - Monitor UOP and  redose loop diuretic as able. IV for now until his  mental status improves - Strict I/O and daily weights         Essential hypertension     Elevated BP on admission. 150-180s/90s most recently. Perhaps volume  overload is contributing here? Home medications, given improvement with  mentation, intermittently refusing some medications throughout morning  shift - Diuresis and HD as above  - Home amlodipine 5mg  on HD days, 10mg  on non-HD days - Home Losartan 100mg  daily - Home hydral 25mg  TID - Home metolazone 5mg  daily          ESRD on dialysis Christopher Davila)     Patient more agitated/combative overnight after dialysis session  yesterday evening. Nephrology following, and planning for HD tomorrow w/  repeat CXR to reassess volume status  HD MWF, most recently Friday - nephrology consulted, s/p 5L HD on 8/4 and 4L on 8/5  - Continue home phos binders         Heart failure with preserved ejection fraction Christopher Davila)     Patient has history, most recent ECHO October 2022, EF 55-60%, with  only mildly dilated right and left atrium. Admit BNP 890, CXR b/l pleural  effusions, given IV lasix 80 mg in ED - Repeat ECHO w/ LV EF 60-65% w/ grade I diastolic dysfunction and  severely dilated right atrium, moderate tricuspid regurg   - losartan 100 mg daily          Acute encephalopathy     He was hypoxic on arrival and only responsive to noxious  stimuli.   Suspect is was metabolic in setting of incomplete dialysis sessions  causing volume overload and metabolic encephalopathy? VBG without evidence  of hypercarbia. Looking back at past admissions it does seem that he is  frequently encephalopathic on arrival but improves with treatment of his  underlying condition. Encephalopathy improved after 2 rounds of HD. Became  more agitated/combative overnight w/ nursing staff Still has some  lethargy, however awakens w/ verbal stimuli and will speak w/ delayed  responses. Patient refusing meds  intermittently, will discuss with HCPOA  goals of care. Suspect, altered mental status could be delirium given his  increased risk, disrupted sleep, new environment and difficulty  maintaining circadian rhythm.  - CT head negative for acute process - GCS improved this morning - Monitor mental status post-HD.  - Holding pregabalin and narcotics         Loculated pleural effusion    FEN/GI: Regular Diet  PPx: Heparin  Dispo:SNF today or tomorrow. Barriers include clinical improvement and goals of care discussion .   Subjective:  Patient seems more annoyed this morning and not sleeping well inpatient. Takes delayed amount of time to answer questions. Verbalized no other complaints.   Objective: Temp:  [97 F (36.1 C)-100 F (37.8 C)] 100 F (37.8 C) (08/06 1245) Pulse Rate:  [91-100] 93 (08/06 1245) Resp:  [11-20] 16 (08/06 1245) BP: (137-195)/(87-145) 177/87 (08/06 1245) SpO2:  [92 %-100 %] 92 % (08/06 0759) Weight:  [125 lb 3.5 oz (56.8 kg)-136 lb 0.4 oz (61.7 kg)] 136 lb 0.4 oz (61.7 kg) (08/06 0258)  Physical Exam Constitutional:      Appearance: He is not ill-appearing or toxic-appearing.  HENT:     Head: Normocephalic.  Eyes:     Comments:  Blind - opacified corneas  Pulmonary:     Breath sounds: No wheezing.  Abdominal:     General: Abdomen is flat. Bowel sounds are normal.     Tenderness: There is no abdominal tenderness.  Musculoskeletal:     Right lower leg: Edema present.     Left lower leg: Edema present.  Skin:    Coloration: Skin is not jaundiced.  Neurological:     Mental Status: He is more agitated    GCS: GCS eye subscore is 3. GCS verbal subscore is 5. GCS motor subscore is 6.     Comments: Oriented to situation and name, delayed responses to questioning  Psychiatric:        Mood and Affect: Mood normal.        Behavior: Behavior normal.    Laboratory: Most recent CBC Lab Results  Component Value Date   WBC 11.1 (H) 01/20/2023   HGB 11.5 (L)  01/20/2023   HCT 36.2 (L) 01/20/2023   MCV 91.9 01/20/2023   PLT 159 01/20/2023   Most recent BMP    Latest Ref Rng & Units 01/20/2023    4:35 AM  BMP  Glucose 70 - 99 mg/dL 78   BUN 6 - 20 mg/dL 22   Creatinine 5.78 - 1.24 mg/dL 4.69   Sodium 629 - 528 mmol/L 133   Potassium 3.5 - 5.1 mmol/L 4.7   Chloride 98 - 111 mmol/L 92   CO2 22 - 32 mmol/L 23   Calcium 8.9 - 10.3 mg/dL 9.4     Other pertinent labs None collected    Imaging/Diagnostic Tests:  Imaging Study (ie. Chest x-ray) Impression from Radiologist: CHF pattern including pleural effusions.     My Interpretation: Diffuse  haziness, Pleural effusions bilaterally R>L    CT: No acute intracranial process    Peterson Ao, MD 01/21/2023, 1:37 PM  PGY-1, [facility] Psychiatry Resident  FPTS Intern pager: [phone], text pages welcome Secure chat group [facility] Teaching Service

## 2023-01-21 NOTE — Assessment & Plan Note (Addendum)
He was hypoxic on arrival and only responsive to noxious stimuli.  Suspect is was metabolic in setting of incomplete dialysis sessions causing volume overload and metabolic encephalopathy? VBG without evidence of hypercarbia. Looking back at past admissions it does seem that he is frequently encephalopathic on arrival but improves with treatment of his underlying condition. Encephalopathy improved after 2 rounds of HD. Became more agitated/combative overnight w/ nursing staff Still has some lethargy, however awakens w/ verbal stimuli and will speak w/ delayed responses. Patient refusing meds intermittently, will discuss with HCPOA goals of care. Suspect, altered mental status could be delirium given his increased risk, disrupted sleep, new environment and difficulty maintaining circadian rhythm.  - CT head negative for acute process - GCS improved this morning - Monitor mental status post-HD.  - Holding pregabalin and narcotics  - Will hold on psych consult rn,

## 2023-01-21 NOTE — Assessment & Plan Note (Signed)
Patient has history, most recent ECHO October 2022, EF 55-60%, with only mildly dilated right and left atrium. Admit BNP 890, CXR b/l pleural effusions, given IV lasix 80 mg in ED - Repeat ECHO w/ LV EF 60-65% w/ grade I diastolic dysfunction and severely dilated right atrium, moderate tricuspid regurg   - losartan 100 mg daily

## 2023-01-21 NOTE — Progress Notes (Signed)
Pt refused afternoon medications. Would not answer when spoken to just ignored Clinical research associate. Will reapproach at later time

## 2023-01-21 NOTE — Assessment & Plan Note (Signed)
On 5L [place], up from 3L documented at home. Appears to be grossly volume overloaded with 3+ pitting edema to BLE. Has both ESRD and CHF, recent echo w/ EF 60-65% w/ Grade 1 diastolic dysfunction. Makes urine, so given 80mg  IV Lasix in the ED. Is on torsemide 100mg  BID at his SNF. Continues to be volume overload and sating on 5L in the room.  - Progressive floor, Inpatient status - Have consulted nephrology for HD, will repeat CXR tomorrow   - No ischemic changes appreciated on EKG - Monitor UOP and redose loop diuretic as able. IV for now until his mental status improves - Strict I/O and daily weights

## 2023-01-22 ENCOUNTER — Inpatient Hospital Stay (HOSPITAL_COMMUNITY)

## 2023-01-22 DIAGNOSIS — J9601 Acute respiratory failure with hypoxia: Secondary | ICD-10-CM | POA: Diagnosis not present

## 2023-01-22 LAB — CBC WITH DIFFERENTIAL/PLATELET
Abs Immature Granulocytes: 0.04 10*3/uL (ref 0.00–0.07)
Basophils Absolute: 0.1 10*3/uL (ref 0.0–0.1)
Basophils Relative: 1 %
Eosinophils Absolute: 0.4 10*3/uL (ref 0.0–0.5)
Eosinophils Relative: 4 %
HCT: 35.4 % — ABNORMAL LOW (ref 39.0–52.0)
Hemoglobin: 11.4 g/dL — ABNORMAL LOW (ref 13.0–17.0)
Immature Granulocytes: 0 %
Lymphocytes Relative: 16 %
Lymphs Abs: 1.6 10*3/uL (ref 0.7–4.0)
MCH: 29.8 pg (ref 26.0–34.0)
MCHC: 32.2 g/dL (ref 30.0–36.0)
MCV: 92.7 fL (ref 80.0–100.0)
Monocytes Absolute: 1.5 10*3/uL — ABNORMAL HIGH (ref 0.1–1.0)
Monocytes Relative: 14 %
Neutro Abs: 6.8 10*3/uL (ref 1.7–7.7)
Neutrophils Relative %: 65 %
Platelets: 168 10*3/uL (ref 150–400)
RBC: 3.82 MIL/uL — ABNORMAL LOW (ref 4.22–5.81)
RDW: 16.9 % — ABNORMAL HIGH (ref 11.5–15.5)
WBC: 10.4 10*3/uL (ref 4.0–10.5)
nRBC: 0 % (ref 0.0–0.2)

## 2023-01-22 LAB — BASIC METABOLIC PANEL
Anion gap: 19 — ABNORMAL HIGH (ref 5–15)
BUN: 38 mg/dL — ABNORMAL HIGH (ref 6–20)
CO2: 26 mmol/L (ref 22–32)
Calcium: 9.1 mg/dL (ref 8.9–10.3)
Chloride: 90 mmol/L — ABNORMAL LOW (ref 98–111)
Creatinine, Ser: 6.64 mg/dL — ABNORMAL HIGH (ref 0.61–1.24)
GFR, Estimated: 9 mL/min — ABNORMAL LOW (ref >60)
Glucose, Bld: 85 mg/dL (ref 70–99)
Potassium: 3.4 mmol/L — ABNORMAL LOW (ref 3.5–5.1)
Sodium: 135 mmol/L (ref 135–145)

## 2023-01-22 LAB — GLUCOSE, CAPILLARY: Glucose-Capillary: 96 mg/dL (ref 70–99)

## 2023-01-22 MED ORDER — ACETAMINOPHEN 325 MG PO TABS
650.0000 mg | ORAL_TABLET | Freq: Once | ORAL | Status: AC
  Administered 2023-01-22: 650 mg via ORAL

## 2023-01-22 MED ORDER — LIDOCAINE HCL (PF) 1 % IJ SOLN: 5.0000 mL | INTRAMUSCULAR | Status: DC | PRN

## 2023-01-22 MED ORDER — ALTEPLASE 2 MG IJ SOLR: 2.0000 mg | Freq: Once | INTRAMUSCULAR | Status: DC | PRN

## 2023-01-22 MED ORDER — LIDOCAINE-PRILOCAINE 2.5-2.5 % EX CREA: 1.0000 | TOPICAL_CREAM | CUTANEOUS | Status: DC | PRN

## 2023-01-22 MED ORDER — HEPARIN SODIUM (PORCINE) 1000 UNIT/ML DIALYSIS: 1000.0000 [IU] | INTRAMUSCULAR | Status: DC | PRN

## 2023-01-22 MED ORDER — ANTICOAGULANT SODIUM CITRATE 4% (200MG/5ML) IV SOLN: 5.0000 mL | Status: DC | PRN

## 2023-01-22 MED ORDER — PENTAFLUOROPROP-TETRAFLUOROETH EX AERO: 1.0000 | INHALATION_SPRAY | CUTANEOUS | Status: DC | PRN

## 2023-01-22 MED ORDER — HEPARIN SODIUM (PORCINE) 1000 UNIT/ML DIALYSIS
3000.0000 [IU] | Freq: Once | INTRAMUSCULAR | Status: AC
  Administered 2023-01-22: 3000 [IU] via INTRAVENOUS_CENTRAL

## 2023-01-22 MED ORDER — HEPARIN SODIUM (PORCINE) 1000 UNIT/ML IJ SOLN
INTRAMUSCULAR | Status: AC
  Administered 2023-01-22: 3000 [IU]
  Filled 2023-01-22: qty 3

## 2023-01-22 MED ORDER — ACETAMINOPHEN 325 MG PO TABS
ORAL_TABLET | ORAL | Status: AC
  Filled 2023-01-22: qty 2

## 2023-01-22 NOTE — Plan of Care (Signed)

## 2023-01-22 NOTE — Progress Notes (Signed)
Called Taysean Stanczak (patient's healthcare power of attorney) and informed that patient would be discharged from the hospital to his facility today.  I informed her that patient's altered mental status has resolved.  Also informed her that discussed with patient had been leaving his dialysis sessions early.  He told us that transportation back to the facility would come an hour early because he had to finish his dialysis sessions early.  I informed her that we have told the social worker to speak with his facility regarding transportation.  Cousin was appreciative and said that she would check in every couple dialysis sessions to make sure that patient was able to complete his sessions.  Lockie Mola MD  PGY2 [facility]  [facility]

## 2023-01-22 NOTE — TOC Transition Note (Signed)
Transition of Care [facility]) - CM/SW Discharge Note   Patient Details  Name: Christopher Davila MRN: [id number] Date of Birth: 1971-05-23  [facility] CM/SW Contact:  Delilah Shan, LCSWA Phone Number: 01/22/2023, 12:57 PM   Clinical Narrative:     [facility] will [facility] to: [facility]  Anticipated DC date: 01/22/2023  Family notified: Dois Davenport  Transport by Sharin Mons  ?  Per MD patient ready for DC to [facility] . RN, patient, patient's family, and facility notified of DC. Discharge Summary sent to facility. RN given number for report [phone] RM# [id number]. DC packet on chart. Ambulance transport requested for patient.  CSW signing off.   Final next level of care: Skilled Nursing Facility Barriers to Discharge: No Barriers Identified   Patient Goals and CMS Choice   Choice offered to / list presented to :  (Patients cousin Dois Davenport)  Discharge Placement                Patient chooses bed at: [facility] Patient to be transferred to facility by: [facility] Name of family member notified: Dois Davenport Patient and family notified of of transfer: 01/22/23  Discharge Plan and Services Additional resources added to the After Visit Summary for   In-house Referral: Clinical Social Work                                   Social Determinants of Health (SDOH) Interventions SDOH Screenings   Food Insecurity: No Food Insecurity (12/26/2022)  Housing: Low Risk  (12/26/2022)  Transportation Needs: No Transportation Needs (12/26/2022)  Utilities: Not At Risk (12/26/2022)  Tobacco Use: Medium Risk (12/31/2022)     Readmission Risk Interventions    04/06/2021    1:01 PM  Readmission Risk Prevention Plan  Transportation Screening Complete  Medication Review (RN Care Manager) Complete  PCP or Specialist appointment within 3-5 days of discharge Complete  HRI or Home Care Consult Complete  SW Recovery Care/Counseling Consult Complete  Palliative Care Screening Not Applicable   Skilled Nursing Facility Complete

## 2023-01-22 NOTE — Progress Notes (Signed)
D/C order noted. Contacted [place] to advise clinic of pt's d/c today and that pt should resume care on Friday.   Melven Sartorius Renal Navigator [phone]

## 2023-01-22 NOTE — Care Management Important Message (Signed)
Important Message  Patient Details  Name: Christopher Davila MRN: [id number] Date of Birth: 05-Sep-1970   Medicare Important Message Given:  Yes     Renie Ora 01/22/2023, 8:17 AM

## 2023-01-22 NOTE — Progress Notes (Signed)
Daily Progress Note Intern Pager: [phone]  Patient name: Hector Shade Medical record number: [id number] Date of birth: March 14, 1971 Age: 52 y.o. Gender: male  Primary Care Provider: Karna Dupes, MD Consultants: Nephrology  Code Status: Full Code  Pt Overview and Major Events to Date:  8/4 Admission, nephrology consult, round 1 HD  8/5 Discussed full code status w/ HCPOA, Round 2 of HD, patient w/ increased agitation/combative overnight, 0.5 Haldol x1  8/6 Patient refused some medications today 8/7 Round 3 of HD, anticipate discharge today.   Assessment and Plan:  Geramie Mortazavi is a 52 y.o. male presenting with increased somnolence and encephalopathy. PMHx of CHF, ESRD on HD MWF and chronic respiratory failure on 3L at SNF. The patient received hemodialysis last night, where 4L were removed. Round 3 of dialysis this morning. Repeat CXR improved from prior w/ decreased presence of pleural effusions.  Patient is oxygen status continues to improve with O2 sats ranging 93-99 on 3 L of nasal cannula.  This is closer to the patient's home regiment of 3 L due to to the history of chronic respiratory failure.  With the patient's mental status improving with dialysis anticipate him discharging this afternoon.  Patient has been unable to get for dialysis sessions due to his schedule transportation driver.  Working with social work to contact his agency in order to optimize for dialysis towards completion. [facility]     * (Principal) Acute hypoxic respiratory failure (HCC)     On 5L [place], up from 3L documented at home. Appears to be grossly volume  overloaded with 3+ pitting edema to BLE. Has both ESRD and CHF, recent  echo w/ EF 60-65% w/ Grade 1 diastolic dysfunction. Makes urine, so given  80mg  IV Lasix in the ED. Is on torsemide 100mg  BID at his SNF.  Volume overload has improved with dialysis and diuresis with multiple sessions and diuretic modalities.  Chest x-ray  also looks improved with less pleural effusions present.  Patient is satting currently on 3 L of oxygen, reported sats have been between 93 and 99 in the EMR. - Progressive floor, Inpatient status, will d/c today or tomorrow  - Have consulted nephrology for HD   - No ischemic changes appreciated on EKG - Monitor UOP and redose loop diuretic as able. IV for now until his  mental status improves - Strict I/O and daily weights      Essential hypertension     Elevated BP on admission. 150-170s/90-100s most recently. Home medications, given improvement with mentation, intermittently refusing some medications throughout yesterday shift - Diuresis and HD as above  - Home amlodipine 5mg  on HD days, 10mg  on non-HD days - Home Losartan 100mg  daily - Home hydral 25mg  TID - Home metolazone 5mg  daily        ESRD on dialysis [facility])  Nephrology following,  HD MWF, most recently Friday - nephrology consulted, s/p 5L HD on 8/4 and 4L on 8/5  - HD session this AM - Continue home phos binders       Heart failure with preserved ejection fraction [facility])     Patient has history, most recent ECHO October 2022, EF 55-60%, with  only mildly dilated right and left atrium. Admit BNP 890, CXR b/l pleural  effusions, given IV lasix 80 mg in ED - Repeat ECHO w/ LV EF 60-65% w/ grade I diastolic dysfunction and  severely dilated right atrium, moderate tricuspid regurg   -  losartan 100 mg daily       Acute encephalopathy     He was hypoxic on arrival and only responsive to noxious stimuli.   Suspect is was metabolic in setting of incomplete dialysis sessions  causing volume overload and metabolic encephalopathy? VBG without evidence  of hypercarbia. Looking back at past admissions it does seem that he is  frequently encephalopathic on arrival but improves with treatment of his  underlying condition. Encephalopathy improved after 2 rounds of HD. Became  more agitated/combative overnight w/ nursing staff Still  has some  lethargy, however awakens w/ verbal stimuli and will speak w/ delayed  responses.  Patient mental status much better today is no longer agitated as he was yesterday.  Suspect symptoms yesterday were due to acute delirium in the setting of him being in a new environment, has blindness, and frequent interruptions in sleep-wake cycle due to labs,vital checks,and medication administration. - Monitor mental status post-HD.  - Holding pregabalin and narcotics, in the setting of his oversedation and presentation     FEN/GI: Regular PPx: Heparin Dispo:SNF today.   Subjective:  Patient is much better today.  Would like to go home if able.  States that the reason he missed his dialysis sessions is because his schedule transportation driver needs to leave 1 hour early before his session completes.  He does desire to continue to get dialysis.  Denies any fevers chest pain shortness of breath.  Tolerating regular diet without nausea or vomiting.  Objective: Temp:  [98.2 F (36.8 C)-100 F (37.8 C)] 98.2 F (36.8 C) (08/07 0745) Pulse Rate:  [81-99] 91 (08/07 1100) Resp:  [16-26] 20 (08/07 1100) BP: (63-177)/(38-93) 102/67 (08/07 1100) SpO2:  [90 %-99 %] 93 % (08/07 1100) Weight:  [123 lb 7.3 oz (56 kg)-125 lb 1.6 oz (56.7 kg)] 123 lb 7.3 oz (56 kg) (08/07 0745)  Constitutional:      Appearance: He is not ill-appearing or toxic-appearing.  HENT:     Head: Normocephalic.  Eyes:     Comments:  Blind - opacified corneas  Pulmonary:     Breath sounds: No wheezing.  Abdominal:     General: Abdomen is flat. Bowel sounds are normal.     Tenderness: There is no abdominal tenderness.  Skin:    Coloration: Skin is not jaundiced.  Neurological:     Much more cooperative today, oriented to place person and setting, conversational, without delay in speech Psychiatric:        Mood and Affect: Mood normal.        Behavior: Behavior normal.     Laboratory: Most recent CBC Lab Results   Component Value Date   WBC 10.4 01/22/2023   HGB 11.4 (L) 01/22/2023   HCT 35.4 (L) 01/22/2023   MCV 92.7 01/22/2023   PLT 168 01/22/2023   Most recent BMP    Latest Ref Rng & Units 01/22/2023    6:46 AM  BMP  Glucose 70 - 99 mg/dL 85   BUN 6 - 20 mg/dL 38   Creatinine 8.65 - 1.24 mg/dL 7.84   Sodium 696 - 295 mmol/L 135   Potassium 3.5 - 5.1 mmol/L 3.4   Chloride 98 - 111 mmol/L 90   CO2 22 - 32 mmol/L 26   Calcium 8.9 - 10.3 mg/dL 9.1     Other pertinent labs renal function panel pending  Imaging/Diagnostic Tests: CXR:  IMPRESSION: 1. Markedly decreased patchy bibasilar opacities, likely atelectasis/pulmonary edema. 2. Small right and trace  left pleural effusions.  Peterson Ao, MD 01/22/2023, 11:08 AM  PGY-1, [facility] Psychiatry Resident  FPTS Intern pager: [phone], text pages welcome Secure chat group [facility] Teaching Service

## 2023-01-22 NOTE — [place] FL2 (Signed)
MEDICAID FL2 LEVEL OF CARE FORM     IDENTIFICATION  Patient Name: Christopher Davila Birthdate: June 28, 1970 Sex: male Admission Date (Current Location): 01/19/2023  [facility] and [place] Number:  Producer, television/film/video and Address:  [facility] [place], [address] [place], [place] [address]      Provider Number: [id number]  Attending Physician Name and Address:  Carney Living, MD  Relative Name and Phone Number:  Dois [facility]) [phone]    Current Level of Care: Hospital Recommended Level of Care: Skilled Nursing Facility Prior Approval Number:    Date Approved/Denied:   PASRR Number: [id number]  Discharge Plan: SNF    Current Diagnoses: Patient Active Problem List   Diagnosis Date Noted   Agitation 01/20/2023   MRSA (methicillin resistant Staphylococcus aureus) nasal colonization 12/26/2022   Loculated pleural effusion 12/26/2022   Pneumonia 12/25/2022   Acute metabolic encephalopathy 06/13/2022   Acute encephalopathy 06/12/2022   Pleural effusion, bilateral 06/12/2022   Hyperkalemia 06/12/2022   Chronic respiratory failure with hypoxia (HCC) 06/12/2022   Generalized weakness 11/05/2021   Pain due to onychomycosis of toenails of both feet 07/31/2021   Skin lesion 07/31/2021   History of thoracentesis    Acute hypoxic respiratory failure (HCC) 03/26/2021   Pressure injury of skin 11/06/2019   ESRD on dialysis [facility])    Heart failure with preserved ejection fraction (HCC)    Goals of care, counseling/discussion    Weakness 10/05/2019   Vision loss of left eye 09/21/2019   Homeless 09/19/2019   Anemia of chronic disease 09/19/2019   Vision loss, left eye 09/19/2019   Acute loss of vision, right 06/16/2019   Alcohol use    Gastroparesis due to DM [facility])    Essential hypertension    Hyperlipidemia     Orientation RESPIRATION BLADDER Height & Weight     Self, Time, Situation, Place  O2 (Nasal Cannula 3 liters)  External catheter, Continent (External Urinary Catheter) Weight: 123 lb 7.3 oz (56 kg) Height:  5\' 5"  (165.1 cm)  BEHAVIORAL SYMPTOMS/MOOD NEUROLOGICAL BOWEL NUTRITION STATUS      Continent Diet (Please see discharge summary)  AMBULATORY STATUS COMMUNICATION OF NEEDS Skin     Verbally Other (Comment) (Wound/Incision LDAs,Wound/Incision open or dehisced non-pressure wound,leg,R,Lateral,R,Lateral,Lower,Leg,Wound,Foam-Lift Dressing,PRN)                       Personal Care Assistance Level of Assistance              Functional Limitations Info  Sight, Hearing, Speech Sight Info: Impaired (Blind,L,Blind R) Hearing Info: Adequate Speech Info: Adequate    SPECIAL CARE FACTORS FREQUENCY                       Contractures Contractures Info: Not present    Additional Factors Info  Code Status, Allergies Code Status Info: FULL Allergies Info: Morphine,Morphine And Codeine           Current Medications (01/22/2023):  This is the current hospital active medication list Current Facility-Administered Medications  Medication Dose Route Frequency Provider Last Rate Last Admin   0.9 %  sodium chloride infusion   Intravenous PRN Carney Living, MD   Stopped at 01/20/23 0557   acetaminophen (TYLENOL) 325 MG tablet            alteplase (CATHFLO ACTIVASE) injection 2 mg  2 mg Intracatheter Once PRN Pola Corn, NP  amLODipine (NORVASC) tablet 10 mg  10 mg Oral Once per day on Sunday Tuesday Thursday Saturday Alicia Amel, MD       amLODipine [facility]) tablet 5 mg  5 mg Oral Once per day on Monday Wednesday Friday Darnelle Spangle B, MD       anticoagulant sodium citrate solution 5 mL  5 mL Intracatheter PRN Pola Corn, NP       aspirin EC tablet 81 mg  81 mg Oral Daily Darnelle Spangle B, MD   81 mg at 01/21/23 0848   brimonidine (ALPHAGAN) 0.2 % ophthalmic solution 1 drop  1 drop Both Eyes Q12H Chambliss, Estill Batten, MD   1 drop at 01/21/23 2330    And   timolol (TIMOPTIC) 0.5 % ophthalmic solution 1 drop  1 drop Both Eyes Q12H Carney Living, MD   1 drop at 01/21/23 2330   calcium acetate (PHOSLO) capsule 1,334 mg  1,334 mg Oral TID WC Pola Corn, NP   1,334 mg at 01/21/23 0847   Chlorhexidine Gluconate Cloth 2 % PADS 6 each  6 each Topical Q0600 Meredeth Ide, MD   6 each at 01/21/23 2200   doxercalciferol (HECTOROL) injection 6 mcg  6 mcg Intravenous Q M,W,F-HD Pola Corn, NP   6 mcg at 01/22/23 1143   famotidine (PEPCID) IVPB 20 mg premix  20 mg Intravenous QHS Silvana Newness, RPH 100 mL/hr at 01/21/23 2317 20 mg at 01/21/23 2317   haloperidol lactate (HALDOL) injection 0.5 mg  0.5 mg Intravenous Q6H PRN Peterson Ao, MD       heparin injection 1,000 Units  1,000 Units Intracatheter PRN Pola Corn, NP       heparin injection 5,000 Units  5,000 Units Subcutaneous Q8H Alicia Amel, MD   5,000 Units at 01/21/23 2305   hydrALAZINE (APRESOLINE) tablet 25 mg  25 mg Oral TID Alicia Amel, MD   25 mg at 01/21/23 2307   latanoprost (XALATAN) 0.005 % ophthalmic solution 1 drop  1 drop Both Eyes QHS Darnelle Spangle B, MD   1 drop at 01/21/23 2330   lidocaine (PF) (XYLOCAINE) 1 % injection 5 mL  5 mL Intradermal PRN Pola Corn, NP       lidocaine-prilocaine (EMLA) cream 1 Application  1 Application Topical PRN Pola Corn, NP       lip balm (CARMEX) ointment 1 Application  1 Application Topical PRN Carney Living, MD   1 Application at 01/20/23 0355   losartan (COZAAR) tablet 100 mg  100 mg Oral Daily Darnelle Spangle B, MD   100 mg at 01/21/23 0847   melatonin tablet 10 mg  10 mg Oral QHS Alicia Amel, MD   10 mg at 01/21/23 2309   metolazone (ZAROXOLYN) tablet 5 mg  5 mg Oral Daily Alicia Amel, MD   5 mg at 01/21/23 0847   mupirocin ointment (BACTROBAN) 2 % 1 Application  1 Application Nasal BID Carney Living, MD   1 Application at 01/20/23 1030    pentafluoroprop-tetrafluoroeth (GEBAUERS) aerosol 1 Application  1 Application Topical PRN Pola Corn, NP       torsemide [facility]) tablet 100 mg  100 mg Oral BID Alicia Amel, MD   100 mg at 01/21/23 1659     Discharge Medications: Please see discharge summary for a list of discharge medications.  Relevant Imaging Results:  Relevant Lab Results:   Additional Information [id number]  HD at [facility] on MWF 11:45 chair time  Delilah Shan, [address]

## 2023-01-22 NOTE — Progress Notes (Signed)
   01/22/23 1308  Vitals  Temp 97.8 F (36.6 C)  Pulse Rate 84  Resp 16  BP 116/80  SpO2 96 %  O2 Device Nasal Cannula  Oxygen Therapy  O2 Flow Rate (L/min) 2 L/min  Patient Activity (if Appropriate) In bed  Pulse Oximetry Type Continuous  Post Treatment  Dialyzer Clearance Clear  Duration of HD Treatment -hour(s) 4 hour(s)  Hemodialysis Intake (mL) 0 mL  Liters Processed 76.4  Fluid Removed (mL) 1900 mL  Tolerated HD Treatment Yes  Post-Hemodialysis Comments Tx completed and UF was not met as order, D/T decrease SBP. Admin Medication and VSS and report called to Bedside Rosann Auerbach RN  AVG/AVF Arterial Site Held (minutes) 5 minutes  AVG/AVF Venous Site Held (minutes) 5 minutes   Received patient in bed to unit.  Alert and oriented.  Informed consent signed and in chart.   TX duration:4  Patient tolerated well.  Transported back to the room  Alert, without acute distress.  Hand-off given to patient's nurse.   Access used: Yes Access issues: No  Total UF removed: 1900 Medication(s) given: See MAR Post HD VS: See Above Grid Post HD weight: 53.9 kg   [facility]

## 2023-01-22 NOTE — Discharge Summary (Signed)
Family Medicine Teaching [facility] Discharge Summary  Patient name: Christopher Davila Medical record number: [id number] Date of birth: Sep 04, 1970 Age: 52 y.o. Gender: male Date of Admission: 01/19/2023  Date of Discharge: 01/22/2023 Admitting Physician: Peterson Ao, MD  Primary Care Provider: Karna Dupes, MD Consultants: Nephrology   Indication for Hospitalization: Encephalopathy 2/2 incomplete dialysis sessions  Brief Hospital Course:  Christopher Davila is a 52 y.o. male who presented with lethargy, altered mental status and hypoxia. He was admitted to the family medicine teaching service. His hospital course is outlined by pertinent hospital problems below  Altered Mental Status/Lethargy/Somnolence Patient experienced increased somnolence in the setting of hypoxia while at [facility] skilled nursing facility and they called EMS. Patient has a history of end stage renal disease where he gets hemodialysis MWF. Upon admission patient was only arousable to deep sternal rub upon assessment. He received 80 mg of lasix IV (does produce urine) and nephrology was consulted. BUN only 46. After further research by nephrology, the patient last received HD on 7/31 and signed off 1 hour early. He has a chronic history of being chronically underdialyzed and volume overloaded due to shortened treatments. Nephrology recommended serial dialysis diuresis which started on 8/4.  Critical care was consulted to make for continued somnolence and believed his acute encephalopathy was related to pregabalin toxicity and improper dose in the setting of ESRD-did not have to be intubated. After second round of dialysis on 8/5, the patient had increased agitation and was combative towards the nursing staff. He was difficult to contain and required a dose of haldol. On 8/6 the patient was intermittently refusing medications and would not answer providers questions. Suspect, episode was acute delirium given  his disability, new environment and frequent disturbances in circadian rhytym. Patient reported that his transportation driver, was the reason behind him leaving his dialysis sessions early. Nephrology, performed a dialysis treatment on 8/7 and patient was discharged to [facility].   Patient's mental status at discharge was improved prior to discharge.  Hypoxia Patient has a history of chronic respiratory failure on 3-4L at home. The patient was at SNF and they were concerned due to his increased somnolence. At that time, they assessed his O2 sats which were in the 80s and called EMS. While in the ED, the patient was up to 5L and would desat back into the 80s whenever weaned down. Chest x-ray collected was notable for bilateral pleural effusions. The patient was given a dose of IV lasix and dialysized by nephrology. Repeat chest x-ray was obtained and demonstrated markedly decreased pleural effusions and decreased opacities suspect to atelectasis or pulmonary edema. The patient was satting well back 90s-100s on home oxygen requirement.    Congestion Heart Failure  Patient has a prior medical history with his most recent echo occurring in October of 2022. Updated echo was ordered and noted an EF 60-65 w/ grade 1 diastolic dysfunction. He was mantained on his home torsemide 100 mg BID regimen during  Other chronic conditions were medically managed with home medications and formulary alternatives as necessary ( HTN, Muscle Fasciculations,) -- HTN: Home amlodipine 5mg  on HD days, 10mg  on non-HD day, Losartan 100mg  daily,Home hydral 25mg  TID, Home metolazone 5mg  daily  - His home lyrica was discontinued per nephrology's recommendations, believed that his prior dose was contributing to his oversedation - His home percocet was also held and discontinued throughout the hospitalization to no exacerbate his lethargy and somnolence.    PCP Follow-up Recommendations: Discontinued  lyrica, nephrology was  concerned for potential toxicity in the setting of ESRD Discontinued percocet to not worsen altered mental status Discontinued melatonin 10 mg, to not worsen somnolence, could consider a smaller dose if sleep worsens.   Discharge Diagnoses/Problem List:  Disposition: SNF   Discharge Condition: Stable  Discharge Exam:   Physical Exam Constitutional:      Appearance: He is not ill-appearing or toxic-appearing.  HENT:     Head: Normocephalic.  Eyes:     Comments:  Blind - opacified corneas  Pulmonary:     Breath sounds: No wheezing.  Abdominal:     General: Bowel sounds are normal.     Tenderness: There is no abdominal tenderness.  Skin:    Coloration: Skin is not jaundiced.  Neurological:     Mental Status: He is oriented to person, place and time. Cooperative with conversation today Psychiatric:        Mood and Affect: Mood normal.        Behavior: Behavior normal.    Significant Procedures: Hemodialysis x3   Significant Labs and Imaging:  Recent Labs  Lab 01/20/23 1309 01/22/23 0646  WBC 11.1* 10.4  HGB 11.5* 11.4*  HCT 36.2* 35.4*  PLT 159 168   Recent Labs  Lab 01/22/23 0646  NA 135  K 3.4*  CL 90*  CO2 26  GLUCOSE 85  BUN 38*  CREATININE 6.64*  CALCIUM 9.1   Results/Tests Pending at Time of Discharge: None   Discharge Medications:  Allergies as of 01/22/2023       Reactions   Morphine Itching   Pt prefers not to be given this drug   Morphine And Codeine Itching, Other (See Comments)   Pt prefers not to be given this drug        Medication List     STOP taking these medications    capsaicin 0.025 % cream Commonly known as: ZOSTRIX   guaiFENesin 100 MG/5ML liquid Commonly known as: ROBITUSSIN   Melatonin 10 MG Tabs   oxyCODONE-acetaminophen 5-325 MG tablet Commonly known as: PERCOCET/ROXICET   pregabalin 25 MG capsule Commonly known as: LYRICA   promethazine 12.5 MG tablet Commonly known as: PHENERGAN   sennosides-docusate  sodium 8.6-50 MG tablet Commonly known as: SENOKOT-S       TAKE these medications    acetaminophen 650 MG suppository Commonly known as: TYLENOL Place 650 mg rectally every 6 (six) hours as needed for mild pain or moderate pain.   albuterol 108 (90 Base) MCG/ACT inhaler Commonly known as: VENTOLIN HFA Inhale 2 puffs into the lungs every 4 (four) hours as needed for shortness of breath.   amLODipine 5 MG tablet Commonly known as: NORVASC Take 5 mg by mouth See admin instructions. Give 5mg  by mouth one time a day every Monday, Wednesday, Friday for Hypertension.   amLODipine 5 MG tablet Commonly known as: NORVASC Take 10 mg by mouth See admin instructions. Give 10mg  by mouth one time a day every Tuesday, Thursday, Saturday, Sunday for htn.   aspirin EC 81 MG tablet Take 1 tablet (81 mg total) by mouth daily.   atorvastatin 40 MG tablet Commonly known as: Lipitor Take 1 tablet (40 mg total) by mouth daily.   calcium acetate 667 MG capsule Commonly known as: PHOSLO Take 1 capsule (667 mg total) by mouth 3 (three) times daily with meals.   Cholecalciferol 1.25 MG (50000 UT) Tabs Take 50,000 Units by mouth once a week. Sundays.   cloNIDine 0.1 MG  tablet Commonly known as: CATAPRES Take 0.1 mg by mouth every 12 (twelve) hours as needed (Hypertension). Give for SBP greater than 160 and DBP greater than 100 check BP after administration.   Combigan 0.2-0.5 % ophthalmic solution Generic drug: brimonidine-timolol Place 1 drop into both eyes in the morning and at bedtime.   famotidine 20 MG tablet Commonly known as: PEPCID Take 20 mg by mouth daily.   feeding supplement (NEPRO CARB STEADY) Liqd Take 120 mLs by mouth 2 (two) times daily.   hydrALAZINE 25 MG tablet Commonly known as: APRESOLINE Take 25 mg by mouth 3 (three) times daily.   hyoscyamine 0.125 MG Tbdp disintergrating tablet Commonly known as: ANASPAZ Place 0.125 mg under the tongue every 4 (four) hours  as needed (excess secretions). Not to exceed 6 doses in 24 hours.   latanoprost 0.005 % ophthalmic solution Commonly known as: XALATAN Place 1 drop into the left eye at bedtime. What changed: how to take this   losartan 100 MG tablet Commonly known as: COZAAR Take 1 tablet (100 mg total) by mouth daily.   Menthol (Topical Analgesic) 5 % Gel Apply 1 application. topically every 8 (eight) hours as needed (hand/wrist pain).   metolazone 5 MG tablet Commonly known as: ZAROXOLYN Take 5 mg by mouth daily.   ondansetron 4 MG tablet Commonly known as: ZOFRAN Take 4 mg by mouth every 6 (six) hours as needed for nausea or vomiting.   OXYGEN Inhale 3 L into the lungs continuous.   polyethylene glycol 17 g packet Commonly known as: MIRALAX / GLYCOLAX Take 17 g by mouth every 12 (twelve) hours as needed for mild constipation or moderate constipation.   pramoxine-zinc acetate 1-0.1 % Lotn Commonly known as: CALADRYL Apply 1 application. topically every 8 (eight) hours as needed (back, torso, arms, and legs for puritus).   torsemide 100 MG tablet Commonly known as: DEMADEX Take 100 mg by mouth 2 (two) times daily.        Discharge Instructions: Please refer to Patient Instructions section of EMR for full details.  Patient was counseled important signs and symptoms that should prompt return to medical care, changes in medications, dietary instructions, activity restrictions, and follow up appointments.   Follow-Up Appointments:   Peterson Ao, MD 01/22/2023, 11:08 AM PGY-1, [facility] Psychiatry

## 2023-01-22 NOTE — TOC Progression Note (Addendum)
Transition of Care [facility]) - Progression Note    Patient Details  Name: Christopher Davila MRN: [id number] Date of Birth: 07-29-1970  [facility] CM/SW Contact  Delilah Shan, LCSWA Phone Number: 01/22/2023, 12:35 PM  Clinical Narrative:     CSW spoke with Crystal with [facility] who confirmed facility can accept patient back today. MD informed CSW that patient informed him that he can't finish his dialysis treatments all the way because his scheduled driver has to leave an hour early. CSW informed Crystal with [place] who informed CSW she is going to follow up to make sure transportation is scheduled correctly. CSW will continue to follow  Update- Crystal with [facility] called CSW back and informed CSW she spoke with Administrator and verified that is patient is receiving his full scheduled dialysis treatment. CSW informed MD.  Expected Discharge Plan: Skilled Nursing Facility Barriers to Discharge: Continued Medical Work up  Expected Discharge Plan and Services In-house Referral: Clinical Social Work     Living arrangements for the past 2 months: Skilled Nursing Facility Expected Discharge Date: 01/22/23                                     Social Determinants of Health (SDOH) Interventions SDOH Screenings   Food Insecurity: No Food Insecurity (12/26/2022)  Housing: Low Risk  (12/26/2022)  Transportation Needs: No Transportation Needs (12/26/2022)  Utilities: Not At Risk (12/26/2022)  Tobacco Use: Medium Risk (12/31/2022)    Readmission Risk Interventions    04/06/2021    1:01 PM  Readmission Risk Prevention Plan  Transportation Screening Complete  Medication Review (RN Care Manager) Complete  PCP or Specialist appointment within 3-5 days of discharge Complete  HRI or Home Care Consult Complete  SW Recovery Care/Counseling Consult Complete  Palliative Care Screening Not Applicable  Skilled Nursing Facility Complete

## 2023-01-22 NOTE — Progress Notes (Signed)
[name] Progress Note   Subjective:    Seen and examined on HD. Patient is calm and reports feeling fine. Denies SOB, CP, and N/V. Noted Bps were down-trending and he's euvolemic on exam. Now on 3L [place]. Now under EDW. UFG lowered to 2.5L. Keep SBP > 90.  Objective Vitals:   01/22/23 0930 01/22/23 1000 01/22/23 1014 01/22/23 1030  BP: 129/89 96/68 (!) 63/38 113/70  Pulse: 99 90 96 88  Resp: 16 (!) 25 20 (!) 26  Temp:      TempSrc:      SpO2: 93% 94% 90% 93%  Weight:      Height:       Physical Exam General: Chronically-ill appearing; Responds to voice; on 3L [place] Heart: S1 and S2; No MRGs Lungs: Clear anteriorly; breathing unlabored Abdomen: Soft and non-tender Extremities: No LE edema Dialysis Access: AVF (+) B/T  Filed Weights   01/21/23 0258 01/22/23 0313 01/22/23 0745  Weight: 61.7 kg 56.7 kg 56 kg    Intake/Output Summary (Last 24 hours) at 01/22/2023 1045 Last data filed at 01/22/2023 0700 Gross per 24 hour  Intake 89.58 ml  Output --  Net 89.58 ml    Additional Objective Labs: Basic Metabolic Panel: Recent Labs  Lab 01/19/23 0741 01/19/23 0747 01/20/23 0435 01/22/23 0646  NA 138 139 133* 135  K 4.0 4.0 4.7 3.4*  CL 96*  --  92* 90*  CO2 24  --  23 26  GLUCOSE 115*  --  78 85  BUN 46*  --  22* 38*  CREATININE 7.11*  --  4.01* 6.64*  CALCIUM 9.3  --  9.4 9.1  PHOS  --   --  3.6  --    Liver Function Tests: Recent Labs  Lab 01/19/23 0741 01/20/23 0435  AST 20  --   ALT 22  --   ALKPHOS 120  --   BILITOT 0.9  --   PROT 7.7  --   ALBUMIN 3.4* 3.6   No results for input(s): "LIPASE", "AMYLASE" in the last 168 hours. CBC: Recent Labs  Lab 01/19/23 0741 01/19/23 0747 01/20/23 1309 01/22/23 0646  WBC 10.7*  --  11.1* 10.4  NEUTROABS 9.1*  --   --  6.8  HGB 11.5* 12.9* 11.5* 11.4*  HCT 36.9* 38.0* 36.2* 35.4*  MCV 93.9  --  91.9 92.7  PLT 146*  --  159 168   Blood Culture    Component Value Date/Time   SDES BLOOD LEFT  WRIST 12/25/2022 2115   SPECREQUEST  12/25/2022 2115    BOTTLES DRAWN AEROBIC AND ANAEROBIC Blood Culture adequate volume   CULT  12/25/2022 2115    NO GROWTH 5 DAYS Performed at [facility], [address], [place], [place] [address]    REPTSTATUS 12/30/2022 FINAL 12/25/2022 2115    Cardiac Enzymes: No results for input(s): "CKTOTAL", "CKMB", "CKMBINDEX", "TROPONINI" in the last 168 hours. CBG: Recent Labs  Lab 01/19/23 0816 01/20/23 0328 01/20/23 0744 01/20/23 1219 01/21/23 2132  GLUCAP 99 86 91 93 92   Iron Studies: No results for input(s): "IRON", "TIBC", "TRANSFERRIN", "FERRITIN" in the last 72 hours. Lab Results  Component Value Date   INR 1.1 05/07/2022   INR 1.1 10/13/2019   INR 1.0 06/16/2019   Studies/Results: DG CHEST PORT 1 VIEW  Result Date: 01/22/2023 CLINICAL DATA:  Volume overload EXAM: PORTABLE CHEST 1 VIEW COMPARISON:  Chest radiograph dated 01/19/2023 FINDINGS: Normal lung volumes. Markedly decreased patchy  bibasilar opacities. small right and trace left pleural effusions. Similar mildly enlarged cardiomediastinal silhouette. No acute osseous abnormality. IMPRESSION: 1. Markedly decreased patchy bibasilar opacities, likely atelectasis/pulmonary edema. 2. Small right and trace left pleural effusions. Electronically Signed   By: Agustin Cree M.D.   On: 01/22/2023 08:20    Medications:  sodium chloride Stopped (01/20/23 0557)   famotidine (PEPCID) IV 20 mg (01/21/23 2317)    amLODipine  10 mg Oral Once per day on Sunday Tuesday Thursday Saturday   amLODipine  5 mg Oral Once per day on Monday Wednesday Friday   aspirin EC  81 mg Oral Daily   brimonidine  1 drop Both Eyes Q12H   And   timolol  1 drop Both Eyes Q12H   calcium acetate  1,334 mg Oral TID WC   Chlorhexidine Gluconate Cloth  6 each Topical Q0600   doxercalciferol  6 mcg Intravenous Q M,W,F-HD   heparin  5,000 Units Subcutaneous Q8H   hydrALAZINE  25 mg Oral TID   latanoprost  1 drop Both  Eyes QHS   losartan  100 mg Oral Daily   melatonin  10 mg Oral QHS   metolazone  5 mg Oral Daily   mupirocin ointment  1 Application Nasal BID   torsemide  100 mg Oral BID    Dialysis Orders: [facility] MWF 4 hrs 180NRe 400/600 60 kg 2.0 K/ 2.0 Ca AVF - Heparin 3000 units IV three times per week - Hectorol 6 mcg IV three times per week - Mircera 150 mcg IV q 2 weeks (last dose 01/03/2023 Dose was due 01/16/2023  Assessment/Plan: Acute hypoxic respiratory failure in setting of volume overload:Received HD 8/4 and 8/5, attempt to optimize volume. Continue PO Torsemide-appears he's still making some urine. Repeat CXR decreased patchy bibasilar opacities; small R and trace L pleural effusions-slowly improved compared to CXR 8/4.  AMS-patient extremely lethargic at admission. Had an episode of combativeness/agitation on 8/5 but appears calm and cooperative now. No azotemia per labs. Sedating medications stopped including Lyrica.  Muscle fasiculations: No jerking movements noted today. BUN stable. Mostly likely related to lyrica-already stopped.    ESRD -  MWF Missed HD 01/17/2023. Truncates every treatment in outpatient. Received HD 8/4 (5L off) and 8/5 (4L off). Volume is now improving and now under EDW, will need to adjust EDW at discharge. On HD.  Hypertension/volume  - BP high at admit 2nd volume overload. HD 8/4 and 8/5. Vol is now improving. UF as tolerated.  Anemia  - HGB 11.4 No ESA needed. Missed last dose at OP center. Follow HGB.   Metabolic bone disease -  Last PO4 6.6 01/06/2023 corrected calcium OK. Continue VDRA, calcium acetate binders.   Nutrition - NPO at present DMT2 -per primary  Salome Holmes, NP [facility] 01/22/2023,10:45 AM  LOS: 3 days

## 2023-01-23 NOTE — Plan of Care (Signed)
[facility] Patient Discharge Orders- [facility] CLINIC: [facility]  Patient's name: Hector Shade Admit/DC Dates: 01/19/2023 - 01/22/2023  Discharge Diagnoses: Acute hypoxic respiratory failure 2nd volume overload   AMS/jerking movements-sedating meds (Lyrica) stopped  Aranesp: Given: No    Last Hgb: 11.4 PRBC's Given: No  ESA dose for discharge: No ESA for now. Start if Hgb drops below 10.   Heparin change: No  EDW Change: Yes New EDW: 55.5kg. We used bed weights here which may not be entirely accurate.  Notify medical team for any weight discrepancies.  [place] Change: No  Access intervention/Change: No   Hectorol change: No  Discharge Labs: Calcium 9.1 Phosphorus 3.6 Albumin 3.6 K+ 3.4  IV Antibiotics: No   On Coumadin?: No   OTHER/APPTS/LAB ORDERS:    D/C Meds to be reconciled by nurse after every discharge.  Completed By: Salome Holmes, NP   Reviewed by: MD:______ RN_______

## 2023-01-23 NOTE — TOC Transition Note (Signed)
Transition of Care - Initial Contact from Inpatient Facility  Date of discharge: 01/22/23 Date of contact: 01/23/23  Method: Attempted Phone Call Spoke to: No answer  Contacted patient to discuss transition of care from recent inpatient hospitalization but he didn't answer his phone. A voicemail was left to reach out to [facility] for any questions or concerns  Patient will return to his outpatient HD unit on: 01/24/23 at [facility]  Salome Holmes, NP

## 2023-04-07 ENCOUNTER — Ambulatory Visit: Payer: Medicare Other | Admitting: Podiatry

## 2023-10-14 ENCOUNTER — Encounter (HOSPITAL_COMMUNITY): Admission: RE | Payer: Self-pay | Source: Home / Self Care

## 2023-10-14 ENCOUNTER — Ambulatory Visit (HOSPITAL_COMMUNITY): Admission: RE | Admit: 2023-10-14 | Source: Home / Self Care | Admitting: Nephrology

## 2023-10-14 SURGERY — A/V SHUNT INTERVENTION
Anesthesia: LOCAL

## 2023-10-21 ENCOUNTER — Encounter (HOSPITAL_COMMUNITY)
Admission: RE | Disposition: A | Discharge status: Home / Self Care | Payer: Self-pay | Source: Home / Self Care | Attending: Nephrology

## 2023-10-21 ENCOUNTER — Other Ambulatory Visit: Payer: Self-pay

## 2023-10-21 ENCOUNTER — Ambulatory Visit (HOSPITAL_COMMUNITY)
Admission: RE | Admit: 2023-10-21 | Discharge: 2023-10-21 | Disposition: A | Discharge status: Home / Self Care | Attending: Nephrology | Admitting: Nephrology

## 2023-10-21 DIAGNOSIS — N186 End stage renal disease: Secondary | ICD-10-CM | POA: Diagnosis not present

## 2023-10-21 DIAGNOSIS — I5032 Chronic diastolic (congestive) heart failure: Secondary | ICD-10-CM | POA: Insufficient documentation

## 2023-10-21 DIAGNOSIS — Z992 Dependence on renal dialysis: Secondary | ICD-10-CM | POA: Insufficient documentation

## 2023-10-21 DIAGNOSIS — T82858A Stenosis of vascular prosthetic devices, implants and grafts, initial encounter: Secondary | ICD-10-CM | POA: Insufficient documentation

## 2023-10-21 DIAGNOSIS — I132 Hypertensive heart and chronic kidney disease with heart failure and with stage 5 chronic kidney disease, or end stage renal disease: Secondary | ICD-10-CM | POA: Diagnosis not present

## 2023-10-21 DIAGNOSIS — N250 Renal osteodystrophy: Secondary | ICD-10-CM | POA: Insufficient documentation

## 2023-10-21 DIAGNOSIS — E1122 Type 2 diabetes mellitus with diabetic chronic kidney disease: Secondary | ICD-10-CM | POA: Insufficient documentation

## 2023-10-21 DIAGNOSIS — D631 Anemia in chronic kidney disease: Secondary | ICD-10-CM | POA: Diagnosis not present

## 2023-10-21 DIAGNOSIS — Y832 Surgical operation with anastomosis, bypass or graft as the cause of abnormal reaction of the patient, or of later complication, without mention of misadventure at the time of the procedure: Secondary | ICD-10-CM | POA: Insufficient documentation

## 2023-10-21 DIAGNOSIS — Z79899 Other long term (current) drug therapy: Secondary | ICD-10-CM | POA: Diagnosis not present

## 2023-10-21 HISTORY — PX: A/V SHUNT INTERVENTION: CATH118220

## 2023-10-21 SURGERY — A/V SHUNT INTERVENTION
Anesthesia: LOCAL

## 2023-10-21 MED ORDER — LIDOCAINE HCL (PF) 1 % IJ SOLN
INTRAMUSCULAR | Status: AC
  Filled 2023-10-21: qty 30

## 2023-10-21 MED ORDER — LIDOCAINE HCL (PF) 1 % IJ SOLN
INTRAMUSCULAR | Status: DC | PRN
  Administered 2023-10-21: 2 mL

## 2023-10-21 MED ORDER — HEPARIN (PORCINE) IN NACL 2000-0.9 UNIT/L-% IV SOLN
INTRAVENOUS | Status: DC | PRN
  Administered 2023-10-21: 1000 mL

## 2023-10-21 MED ORDER — IODIXANOL 320 MG/ML IV SOLN
INTRAVENOUS | Status: DC | PRN
  Administered 2023-10-21: 12 mL

## 2023-10-21 MED ORDER — MIDAZOLAM HCL 2 MG/2ML IJ SOLN
INTRAMUSCULAR | Status: AC
  Filled 2023-10-21: qty 2

## 2023-10-21 MED ORDER — FENTANYL CITRATE (PF) 100 MCG/2ML IJ SOLN
INTRAMUSCULAR | Status: AC
  Filled 2023-10-21: qty 2

## 2023-10-21 MED ORDER — FENTANYL CITRATE (PF) 100 MCG/2ML IJ SOLN
INTRAMUSCULAR | Status: DC | PRN
  Administered 2023-10-21: 50 ug via INTRAVENOUS

## 2023-10-21 SURGICAL SUPPLY — 8 items
BAG SNAP BAND KOVER 36X36 (MISCELLANEOUS) ×1 IMPLANT
BALLOON MUSTANG 10.0X40 75 (BALLOONS) IMPLANT
CATH ANGIO 5F BER2 65CM (CATHETERS) IMPLANT
COVER DOME SNAP 22 D (MISCELLANEOUS) ×1 IMPLANT
GUIDEWIRE ANGLED .035 180CM (WIRE) IMPLANT
SHEATH PINNACLE R/O II 6F 4CM (SHEATH) IMPLANT
SYR MEDALLION 10ML (SYRINGE) IMPLANT
TRAY PV CATH (CUSTOM PROCEDURE TRAY) ×1 IMPLANT

## 2023-10-21 NOTE — Discharge Instructions (Signed)

## 2023-10-21 NOTE — H&P (Addendum)
 Chief Complaint: Decreased flows and prolonged bleeding  Interval H&P  The patient has presented today for an angiogram/ angioplasty.  Various methods of treatment have been discussed with the patient.  After consideration of risk, benefits and other options for treatment, the patient has consented to a angiogram/ angioplasty with  possible stent placement.   Risks of angiogram with potential angioplasty and stenting if needed.contrast reaction, extravasation/ bleeding, dissection, hypotension and death were explained to the patient.  The patient's history has been reviewed and the patient has been examined, no changes in status.  Stable for angiogram/angioplasty  I have reviewed the patient's chart and labs.  Questions were answered to the patient's satisfaction.  Assessment/Plan: ESRD dialyzing MWF Decreased access flows in a right BBT and prolonged bleeding- planning on angiogram with possibly angioplasty. Renal osteodystrophy - continue binders per home regimen. Anemia - managed with ESA's and IV iron at dialysis center. HTN - resume home regimen.   HPI: Christopher Davila is an 53 y.o. male with history of CHF, diabetes, hypertension, blindness, ESRD being referred for decreasing flows and a right brachial basilic transposed fistula placed by Dr. Shirley Douglas in 2021.  ROS Per HPI.  Chemistry and CBC: Creatinine, Ser  Date/Time Value Ref Range Status  01/22/2023 06:46 AM 6.64 (H) 0.61 - 1.24 mg/dL Final    Comment:    REPEATED TO VERIFY DELTA CHECK NOTED   01/20/2023 04:35 AM 4.01 (H) 0.61 - 1.24 mg/dL Final  16/03/9603 54:09 AM 7.11 (H) 0.61 - 1.24 mg/dL Final  81/19/1478 29:56 AM 6.27 (H) 0.61 - 1.24 mg/dL Final  21/30/8657 84:69 PM 5.02 (H) 0.61 - 1.24 mg/dL Final  62/95/2841 32:44 AM 6.90 (H) 0.61 - 1.24 mg/dL Final  06/19/7251 66:44 AM 10.78 (H) 0.61 - 1.24 mg/dL Final  03/47/4259 56:38 PM 10.31 (H) 0.61 - 1.24 mg/dL Final  75/64/3329 51:88 PM 11.50 (H) 0.61 - 1.24 mg/dL  Final  41/66/0630 16:01 PM 3.74 (H) 0.61 - 1.24 mg/dL Final  09/32/3557 32:20 AM 7.74 (H) 0.61 - 1.24 mg/dL Final  25/42/7062 37:62 AM 6.16 (H) 0.61 - 1.24 mg/dL Final  83/15/1761 60:73 PM 5.12 (H) 0.61 - 1.24 mg/dL Final  71/11/2692 85:46 AM 2.94 (H) 0.61 - 1.24 mg/dL Final    Comment:    DELTA CHECK NOTED  05/09/2021 02:29 AM 4.13 (H) 0.61 - 1.24 mg/dL Final    Comment:    DELTA CHECK NOTED  05/08/2021 02:11 AM 2.83 (H) 0.61 - 1.24 mg/dL Final    Comment:    DELTA CHECK NOTED  05/07/2021 05:00 AM 5.51 (H) 0.61 - 1.24 mg/dL Final  27/08/5007 38:18 PM 5.08 (H) 0.61 - 1.24 mg/dL Final  29/93/7169 67:89 PM 3.07 (H) 0.61 - 1.24 mg/dL Final  38/03/1750 02:58 AM 4.66 (H) 0.61 - 1.24 mg/dL Final  52/77/8242 35:36 PM 2.06 (H) 0.61 - 1.24 mg/dL Final    Comment:    DELTA CHECK NOTED  04/05/2021 01:32 AM 4.59 (H) 0.61 - 1.24 mg/dL Final  14/43/1540 08:67 AM 5.91 (H) 0.61 - 1.24 mg/dL Final  61/95/0932 67:12 PM 5.91 (H) 0.61 - 1.24 mg/dL Final  45/80/9983 38:25 AM 5.73 (H) 0.61 - 1.24 mg/dL Final  05/39/7673 41:93 AM 4.78 (H) 0.61 - 1.24 mg/dL Final  79/07/4095 35:32 AM 3.79 (H) 0.61 - 1.24 mg/dL Final  99/24/2683 41:96 AM 5.02 (H) 0.61 - 1.24 mg/dL Final  22/29/7989 21:19 AM 3.66 (H) 0.61 - 1.24 mg/dL Final    Comment:    DELTA CHECK NOTED DIALYSIS  03/26/2021 06:09 AM 6.10 (H) 0.61 - 1.24 mg/dL Final  40/98/1191 47:82 AM 6.10 (H) 0.61 - 1.24 mg/dL Final  95/62/1308 65:78 PM 5.55 (H) 0.61 - 1.24 mg/dL Final  46/96/2952 84:13 AM 3.00 (H) 0.61 - 1.24 mg/dL Final  24/40/1027 25:36 AM 3.03 (H) 0.61 - 1.24 mg/dL Final  64/40/3474 25:95 AM 3.91 (H) 0.61 - 1.24 mg/dL Final  63/87/5643 32:95 AM 3.15 (H) 0.61 - 1.24 mg/dL Final  18/84/1660 63:01 AM 3.86 (H) 0.61 - 1.24 mg/dL Final  60/03/9322 55:73 AM 3.25 (H) 0.61 - 1.24 mg/dL Final  22/07/5425 06:23 AM 2.42 (H) 0.61 - 1.24 mg/dL Final  76/28/3151 76:16 AM 3.22 (H) 0.61 - 1.24 mg/dL Final  07/37/1062 69:48 AM 2.66 (H) 0.61 - 1.24 mg/dL  Final    Comment:    DIALYSIS  11/11/2019 05:36 AM 4.16 (H) 0.61 - 1.24 mg/dL Final  54/62/7035 00:93 AM 3.80 (H) 0.61 - 1.24 mg/dL Final  81/82/9937 16:96 AM 3.50 (H) 0.61 - 1.24 mg/dL Final  78/93/8101 75:10 AM 3.20 (H) 0.61 - 1.24 mg/dL Final  25/85/2778 24:23 AM 2.60 (H) 0.61 - 1.24 mg/dL Final  53/61/4431 54:00 AM 3.24 (H) 0.61 - 1.24 mg/dL Final  86/76/1950 93:26 AM 2.28 (H) 0.61 - 1.24 mg/dL Final  71/24/5809 98:33 AM 2.87 (H) 0.61 - 1.24 mg/dL Final  82/50/5397 67:34 AM 3.47 (H) 0.61 - 1.24 mg/dL Final  19/37/9024 09:73 AM 4.71 (H) 0.61 - 1.24 mg/dL Final  53/29/9242 68:34 AM 4.55 (H) 0.61 - 1.24 mg/dL Final   No results for input(s): "NA", "K", "CL", "CO2", "GLUCOSE", "BUN", "CREATININE", "CALCIUM ", "PHOS" in the last 168 hours.  Invalid input(s): "ALB" No results for input(s): "WBC", "NEUTROABS", "HGB", "HCT", "MCV", "PLT" in the last 168 hours. Liver Function Tests: No results for input(s): "AST", "ALT", "ALKPHOS", "BILITOT", "PROT", "ALBUMIN " in the last 168 hours. No results for input(s): "LIPASE", "AMYLASE" in the last 168 hours. No results for input(s): "AMMONIA" in the last 168 hours. Cardiac Enzymes: No results for input(s): "CKTOTAL", "CKMB", "CKMBINDEX", "TROPONINI" in the last 168 hours. Iron Studies: No results for input(s): "IRON", "TIBC", "TRANSFERRIN", "FERRITIN" in the last 72 hours. PT/INR: @LABRCNTIP (inr:5)  Xrays/Other Studies: )No results found for this or any previous visit (from the past 48 hours). No results found.  PMH:   Past Medical History:  Diagnosis Date   Acute on chronic diastolic CHF (congestive heart failure) (HCC) 09/27/2019   Acute respiratory failure with hypoxia (HCC) 03/26/2021   Acute urinary retention 10/13/2019   Anemia    Blindness 09/04/2019   CHF (congestive heart failure) (HCC)    COVID-19    Diabetes mellitus    Diarrhea 10/14/2019   Epigastric abdominal pain    ESRD (end stage renal disease) (HCC)    Gastroparesis  due to DM (HCC)    Hypertension    Hypertensive urgency 04/17/2019   Hypervolemia    Hypoalbuminemia 04/17/2019   Hypoxia    Metabolic acidosis, increased anion gap 10/13/2019   Multifocal pneumonia 05/06/2021   Neuropathy of lower extremity    Oral candidiasis 06/26/2012   Palliative care encounter    Pancreatitis 06/26/2012   Weight loss 06/26/2012    PSH:   Past Surgical History:  Procedure Laterality Date   AV FISTULA PLACEMENT Right 10/29/2019   Procedure: RIGHT ARM BRACHIOBASILIC ARTERIOVENOUS (AV) FISTULA CREATION;  Surgeon: Mayo Speck, MD;  Location: [facility] OR;  Service: Vascular;  Laterality: Right;   BASCILIC VEIN TRANSPOSITION Right 01/05/2020   Procedure: RIGHT ARM  SECOND STAGE BASCILIC VEIN TRANSPOSITION;  Surgeon: Mayo Speck, MD;  Location: [facility] OR;  Service: Vascular;  Laterality: Right;   EYE SURGERY     IR FLUORO GUIDE CV LINE RIGHT  10/28/2019   IR US  GUIDE VASC ACCESS RIGHT  10/28/2019    Allergies:  Allergies  Allergen Reactions   Morphine  Itching    Pt prefers not to be given this drug   Morphine  And Codeine  Itching and Other (See Comments)    Pt prefers not to be given this drug    Medications:   Prior to Admission medications   Medication Sig Start Date End Date Taking? Authorizing Provider  amLODipine  (NORVASC ) 5 MG tablet Take 5-10 mg by mouth See admin instructions. Take 10 mg by mouth every Tues, Thurs, Sat, and Sun Take 5 mg by mouth every Mon, Wed, and Fri 05/10/22  Yes [provider]  aspirin  EC 81 MG tablet Take 1 tablet (81 mg total) by mouth daily. 05/24/15  Yes Ghimire, Estil Heman, MD  brimonidine -timolol  (COMBIGAN ) 0.2-0.5 % ophthalmic solution Place 1 drop into both eyes in the morning and at bedtime.   Yes [provider]  calcium  acetate (PHOSLO ) 667 MG capsule Take 1 capsule (667 mg total) by mouth 3 (three) times daily with meals. 11/19/19  Yes Macdonald Savoy, MD  cloNIDine (CATAPRES) 0.1 MG tablet Take 0.1 mg by  mouth every 12 (twelve) hours as needed (Hypertension). Give for SBP greater than 160 and DBP greater than 100 check BP after administration.   Yes [provider]  famotidine  (PEPCID ) 20 MG tablet Take 20 mg by mouth at bedtime. 05/10/22  Yes [provider]  gabapentin  (NEURONTIN ) 300 MG capsule Take 300 mg by mouth daily.   Yes [provider]  hydrALAZINE  (APRESOLINE ) 25 MG tablet Take 25 mg by mouth 3 (three) times daily.   Yes [provider]  hyoscyamine (ANASPAZ) 0.125 MG TBDP disintergrating tablet Place 0.125 mg under the tongue every 4 (four) hours as needed (excess secretions). Not to exceed 6 doses in 24 hours.   Yes [provider]  latanoprost  (XALATAN ) 0.005 % ophthalmic solution Place 1 drop into the left eye at bedtime. 09/23/19  Yes Vann, Jessica U, DO  losartan  (COZAAR ) 100 MG tablet Take 1 tablet (100 mg total) by mouth daily. 11/19/19  Yes Macdonald Savoy, MD  Menthol, Topical Analgesic, 5 % GEL Apply 1 application. topically every 8 (eight) hours as needed (hand/wrist pain).   Yes [provider]  metolazone  (ZAROXOLYN ) 5 MG tablet Take 5 mg by mouth at bedtime. 05/10/22  Yes [provider]  Nutritional Supplement LIQD Take 120 mLs by mouth See admin instructions. Med Pass 2.0 liquid. Take 120 ml twice daily on Tues, Thurs, Sat, and SUn   Yes [provider]  Nutritional Supplements (NOVASOURCE RENAL) LIQD Take 237 mLs by mouth every Monday, Wednesday, and Friday.   Yes [provider]  ondansetron  (ZOFRAN ) 4 MG tablet Take 4 mg by mouth every 6 (six) hours as needed for nausea or vomiting.   Yes [provider]  oxyCODONE -acetaminophen  (PERCOCET/ROXICET) 5-325 MG tablet Take 1 tablet by mouth 4 (four) times daily.   Yes [provider]  polyethylene glycol (MIRALAX  / GLYCOLAX ) 17 g packet Take 17 g by mouth every 12 (twelve) hours as needed for mild constipation or moderate  constipation.   Yes [provider]  tamsulosin (FLOMAX) 0.4 MG CAPS capsule Take 0.4 mg by  mouth daily.   Yes [provider]  torsemide  (DEMADEX ) 100 MG tablet Take 100 mg by mouth 2 (two) times daily.   Yes [provider]  OXYGEN Inhale 3 L into the lungs continuous.    [provider]    Discontinued Meds:   Medications Discontinued During This Encounter  Medication Reason   amLODipine  (NORVASC ) 5 MG tablet Duplicate   Nutritional Supplements (FEEDING SUPPLEMENT, NEPRO CARB STEADY,) LIQD Change in therapy   Cholecalciferol 1.25 MG (50000 UT) TABS Discontinued by provider   pramoxine-zinc acetate (CALADRYL) 1-0.1 % LOTN Discontinued by provider   albuterol  (VENTOLIN  HFA) 108 (90 Base) MCG/ACT inhaler Discontinued by provider   acetaminophen  (TYLENOL ) 650 MG suppository Discontinued by provider   atorvastatin  (LIPITOR ) 40 MG tablet Discontinued by provider    Social History:  reports that he quit smoking about 8 years ago. His smoking use included cigarettes. He has never used smokeless tobacco. He reports that he does not currently use alcohol . He reports that he does not use drugs.  Family History:   Family History  Problem Relation Age of Onset   Diabetes Mother    Lung disease Mother    Hypertension Mother    Diabetes Maternal Aunt    CAD Maternal Aunt    CAD Cousin     Blood pressure (!) 147/88, pulse 84, resp. rate 14, SpO2 99%. GEN: NAD, A&Ox3, NCAT HEENT: No conjunctival pallor, EOMI NECK: Supple, no thyromegaly LUNGS: CTA B/L no rales, rhonchi or wheezing CV: RRR, No M/R/G ABD: SNDNT +BS  EXT: No lower extremity edema ACCESS: Right BBT hyperpulsatile       Ladasia Sircy, Alveda Aures, MD 10/21/2023, 8:04 AM

## 2023-10-21 NOTE — Op Note (Signed)
 Patient presents for prolonged bleeding in his right  BBT which was transposed with Dr. Shirley Douglas in 2021.  On the physical exam, the fistula is hyperpulsatile in the inflow limb.  Summary:  1)      The patient had successful angioplasty (10x4 Mustang FE ~16 atm) of significant 50-60% focal  stenosis in the inflow limb of the basilic vein. Patent juxta anastomosis, outflow swing and centrals. 2)      Flows improved after outflow angioplasty. 3)      This right BBT remains amenable to future percutaneous intervention as long as it remains patent at least 3 months.  Description of procedure: The arm was prepped and draped in the usual sterile fashion. The right upper arm brachial basilic fistula was cannulated (78469) with an 18G Angiocath needle directed in a retrograde direction in venous limb of the fistula. A guidewire was inserted and exchanged for a 6 Fr sheath. Contrast [id number] injection via the side port of the sheath was performed. The angiogram of the fistula (84132) showed a patent outflow basilic swing site; the axillary vein, centrals, cannulation zone.   The angled Glidewire was advanced and manipulated until the tip of the wire was in the brachial artery distal to the anastomosis.  Arteriogram revealed a patent brachial artery, arterial anastomosis but there was a 50-60% focal inflow limb stenosis. A 10x4 Mustang angioplasty balloon was then inserted over the guidewire and positioned at the basilic vein inflow stenosis.   Venous angioplasty (44010) was carried out to 16-18 ATM with FULL effacement of the waist on the balloon at the basilic inflow swing site lesion. The repeat angiogram showed 10% residual stenosis with no evidence of extravasation or dissection.  Hemostasis: A 3-0 ethilon purse string suture was placed at the cannulation site on removal of the sheath.  Sedation: 0 mg Versed , 50mcg Fentanyl . Sedation time. 0 minutes  Contrast. 12 mL  Monitoring: Because of the patient's  comorbid conditions and sedation during the procedure, continuous EKG monitoring and O2 saturation monitoring was performed throughout the procedure by the RN. There were no abnormal arrhythmias encountered.  Complications: None  Diagnoses: I87.1 Stricture of vein  N18.6 ESRD T82.858A Stricture of access  Procedure Coding:  [phone] Cannulation and angiogram of fistula, venous angioplasty (basilic vein inflow limb  site)  G6440 Contrast  Recommendations:  1. Continue to cannulate the fistula with 15G needles.  2. Refer for problems with flows/swelling. 3. Remove the suture next treatment.   Discharge: The patient was discharged home in stable condition. The patient was given education regarding the care of the dialysis access AVF and specific instructions in case of any problems.

## 2023-10-22 ENCOUNTER — Encounter (HOSPITAL_COMMUNITY): Payer: Self-pay | Admitting: Nephrology

## 2023-12-03 ENCOUNTER — Emergency Department (HOSPITAL_COMMUNITY)

## 2023-12-03 ENCOUNTER — Inpatient Hospital Stay (HOSPITAL_COMMUNITY)
Admission: EM | Admit: 2023-12-03 | Discharge: 2023-12-08 | DRG: 640 | Disposition: A | Discharge status: Skilled Nursing Facility | Source: Skilled Nursing Facility | Attending: Internal Medicine | Admitting: Internal Medicine

## 2023-12-03 ENCOUNTER — Encounter (HOSPITAL_COMMUNITY): Payer: Self-pay | Admitting: Emergency Medicine

## 2023-12-03 DIAGNOSIS — N179 Acute kidney failure, unspecified: Secondary | ICD-10-CM | POA: Diagnosis present

## 2023-12-03 DIAGNOSIS — Z9981 Dependence on supplemental oxygen: Secondary | ICD-10-CM | POA: Diagnosis not present

## 2023-12-03 DIAGNOSIS — I272 Pulmonary hypertension, unspecified: Secondary | ICD-10-CM | POA: Diagnosis present

## 2023-12-03 DIAGNOSIS — E875 Hyperkalemia: Secondary | ICD-10-CM | POA: Diagnosis present

## 2023-12-03 DIAGNOSIS — D631 Anemia in chronic kidney disease: Secondary | ICD-10-CM | POA: Diagnosis present

## 2023-12-03 DIAGNOSIS — I132 Hypertensive heart and chronic kidney disease with heart failure and with stage 5 chronic kidney disease, or end stage renal disease: Secondary | ICD-10-CM | POA: Diagnosis present

## 2023-12-03 DIAGNOSIS — Z91158 Patient's noncompliance with renal dialysis for other reason: Secondary | ICD-10-CM

## 2023-12-03 DIAGNOSIS — E119 Type 2 diabetes mellitus without complications: Secondary | ICD-10-CM

## 2023-12-03 DIAGNOSIS — Z7984 Long term (current) use of oral hypoglycemic drugs: Secondary | ICD-10-CM | POA: Diagnosis not present

## 2023-12-03 DIAGNOSIS — Z992 Dependence on renal dialysis: Secondary | ICD-10-CM

## 2023-12-03 DIAGNOSIS — R253 Fasciculation: Secondary | ICD-10-CM | POA: Diagnosis present

## 2023-12-03 DIAGNOSIS — I5033 Acute on chronic diastolic (congestive) heart failure: Secondary | ICD-10-CM | POA: Diagnosis present

## 2023-12-03 DIAGNOSIS — K3184 Gastroparesis: Secondary | ICD-10-CM | POA: Diagnosis present

## 2023-12-03 DIAGNOSIS — E1143 Type 2 diabetes mellitus with diabetic autonomic (poly)neuropathy: Secondary | ICD-10-CM | POA: Diagnosis present

## 2023-12-03 DIAGNOSIS — J9621 Acute and chronic respiratory failure with hypoxia: Secondary | ICD-10-CM | POA: Diagnosis present

## 2023-12-03 DIAGNOSIS — J9601 Acute respiratory failure with hypoxia: Secondary | ICD-10-CM | POA: Diagnosis not present

## 2023-12-03 DIAGNOSIS — N186 End stage renal disease: Secondary | ICD-10-CM

## 2023-12-03 DIAGNOSIS — E1122 Type 2 diabetes mellitus with diabetic chronic kidney disease: Secondary | ICD-10-CM | POA: Diagnosis present

## 2023-12-03 DIAGNOSIS — E871 Hypo-osmolality and hyponatremia: Secondary | ICD-10-CM | POA: Diagnosis present

## 2023-12-03 DIAGNOSIS — R0602 Shortness of breath: Secondary | ICD-10-CM | POA: Diagnosis present

## 2023-12-03 DIAGNOSIS — J4520 Mild intermittent asthma, uncomplicated: Secondary | ICD-10-CM

## 2023-12-03 DIAGNOSIS — N2581 Secondary hyperparathyroidism of renal origin: Secondary | ICD-10-CM | POA: Diagnosis present

## 2023-12-03 DIAGNOSIS — H547 Unspecified visual loss: Secondary | ICD-10-CM | POA: Diagnosis present

## 2023-12-03 DIAGNOSIS — J4489 Other specified chronic obstructive pulmonary disease: Secondary | ICD-10-CM | POA: Diagnosis present

## 2023-12-03 DIAGNOSIS — E1141 Type 2 diabetes mellitus with diabetic mononeuropathy: Secondary | ICD-10-CM | POA: Diagnosis present

## 2023-12-03 DIAGNOSIS — Z885 Allergy status to narcotic agent status: Secondary | ICD-10-CM

## 2023-12-03 DIAGNOSIS — I10 Essential (primary) hypertension: Secondary | ICD-10-CM | POA: Diagnosis present

## 2023-12-03 DIAGNOSIS — Z87891 Personal history of nicotine dependence: Secondary | ICD-10-CM

## 2023-12-03 DIAGNOSIS — J45909 Unspecified asthma, uncomplicated: Secondary | ICD-10-CM | POA: Insufficient documentation

## 2023-12-03 DIAGNOSIS — E8770 Fluid overload, unspecified: Secondary | ICD-10-CM | POA: Diagnosis not present

## 2023-12-03 DIAGNOSIS — Z8249 Family history of ischemic heart disease and other diseases of the circulatory system: Secondary | ICD-10-CM

## 2023-12-03 DIAGNOSIS — R4182 Altered mental status, unspecified: Principal | ICD-10-CM

## 2023-12-03 DIAGNOSIS — Z79899 Other long term (current) drug therapy: Secondary | ICD-10-CM | POA: Diagnosis not present

## 2023-12-03 DIAGNOSIS — H44521 Atrophy of globe, right eye: Secondary | ICD-10-CM | POA: Diagnosis present

## 2023-12-03 DIAGNOSIS — G9341 Metabolic encephalopathy: Secondary | ICD-10-CM | POA: Diagnosis present

## 2023-12-03 DIAGNOSIS — Z833 Family history of diabetes mellitus: Secondary | ICD-10-CM

## 2023-12-03 DIAGNOSIS — Z7982 Long term (current) use of aspirin: Secondary | ICD-10-CM

## 2023-12-03 LAB — I-STAT CHEM 8, ED
BUN: 53 mg/dL — ABNORMAL HIGH (ref 6–20)
Calcium, Ion: 1.08 mmol/L — ABNORMAL LOW (ref 1.15–1.40)
Chloride: 96 mmol/L — ABNORMAL LOW (ref 98–111)
Creatinine, Ser: 7.3 mg/dL — ABNORMAL HIGH (ref 0.61–1.24)
Glucose, Bld: 107 mg/dL — ABNORMAL HIGH (ref 70–99)
HCT: 42 % (ref 39.0–52.0)
Hemoglobin: 14.3 g/dL (ref 13.0–17.0)
Potassium: 7.1 mmol/L (ref 3.5–5.1)
Sodium: 134 mmol/L — ABNORMAL LOW (ref 135–145)
TCO2: 29 mmol/L (ref 22–32)

## 2023-12-03 LAB — COMPREHENSIVE METABOLIC PANEL WITH GFR
ALT: 10 U/L (ref 0–44)
AST: 16 U/L (ref 15–41)
Albumin: 3.6 g/dL (ref 3.5–5.0)
Alkaline Phosphatase: 68 U/L (ref 38–126)
Anion gap: 19 — ABNORMAL HIGH (ref 5–15)
BUN: 38 mg/dL — ABNORMAL HIGH (ref 6–20)
CO2: 25 mmol/L (ref 22–32)
Calcium: 10.3 mg/dL (ref 8.9–10.3)
Chloride: 92 mmol/L — ABNORMAL LOW (ref 98–111)
Creatinine, Ser: 7.49 mg/dL — ABNORMAL HIGH (ref 0.61–1.24)
GFR, Estimated: 8 mL/min — ABNORMAL LOW (ref >60)
Glucose, Bld: 110 mg/dL — ABNORMAL HIGH (ref 70–99)
Potassium: 6.1 mmol/L — ABNORMAL HIGH (ref 3.5–5.1)
Sodium: 136 mmol/L (ref 135–145)
Total Bilirubin: 1.3 mg/dL — ABNORMAL HIGH (ref 0.0–1.2)
Total Protein: 8.2 g/dL — ABNORMAL HIGH (ref 6.5–8.1)

## 2023-12-03 LAB — CBC WITH DIFFERENTIAL/PLATELET
Abs Immature Granulocytes: 0.13 10*3/uL — ABNORMAL HIGH (ref 0.00–0.07)
Basophils Absolute: 0 10*3/uL (ref 0.0–0.1)
Basophils Relative: 1 %
Eosinophils Absolute: 0.1 10*3/uL (ref 0.0–0.5)
Eosinophils Relative: 1 %
HCT: 38 % — ABNORMAL LOW (ref 39.0–52.0)
Hemoglobin: 11.9 g/dL — ABNORMAL LOW (ref 13.0–17.0)
Immature Granulocytes: 2 %
Lymphocytes Relative: 15 %
Lymphs Abs: 1.1 10*3/uL (ref 0.7–4.0)
MCH: 32.8 pg (ref 26.0–34.0)
MCHC: 31.3 g/dL (ref 30.0–36.0)
MCV: 104.7 fL — ABNORMAL HIGH (ref 80.0–100.0)
Monocytes Absolute: 0.7 10*3/uL (ref 0.1–1.0)
Monocytes Relative: 9 %
Neutro Abs: 5.6 10*3/uL (ref 1.7–7.7)
Neutrophils Relative %: 72 %
Platelets: 114 10*3/uL — ABNORMAL LOW (ref 150–400)
RBC: 3.63 MIL/uL — ABNORMAL LOW (ref 4.22–5.81)
RDW: 16 % — ABNORMAL HIGH (ref 11.5–15.5)
WBC: 7.7 10*3/uL (ref 4.0–10.5)
nRBC: 0 % (ref 0.0–0.2)

## 2023-12-03 LAB — I-STAT VENOUS BLOOD GAS, ED
Acid-Base Excess: 5 mmol/L — ABNORMAL HIGH (ref 0.0–2.0)
Bicarbonate: 29.8 mmol/L — ABNORMAL HIGH (ref 20.0–28.0)
Calcium, Ion: 1.08 mmol/L — ABNORMAL LOW (ref 1.15–1.40)
HCT: 39 % (ref 39.0–52.0)
Hemoglobin: 13.3 g/dL (ref 13.0–17.0)
O2 Saturation: 61 %
Potassium: 7 mmol/L (ref 3.5–5.1)
Sodium: 133 mmol/L — ABNORMAL LOW (ref 135–145)
TCO2: 31 mmol/L (ref 22–32)
pCO2, Ven: 43.2 mmHg — ABNORMAL LOW (ref 44–60)
pH, Ven: 7.446 — ABNORMAL HIGH (ref 7.25–7.43)
pO2, Ven: 30 mmHg — CL (ref 32–45)

## 2023-12-03 LAB — HEPATITIS PANEL, ACUTE
HCV Ab: NONREACTIVE
Hep A IgM: NONREACTIVE
Hep B C IgM: NONREACTIVE
Hepatitis B Surface Ag: NONREACTIVE

## 2023-12-03 LAB — BRAIN NATRIURETIC PEPTIDE: B Natriuretic Peptide: 1338.8 pg/mL — ABNORMAL HIGH (ref 0.0–100.0)

## 2023-12-03 LAB — HEPATITIS B SURFACE ANTIGEN: Hepatitis B Surface Ag: NONREACTIVE

## 2023-12-03 LAB — CBG MONITORING, ED: Glucose-Capillary: 88 mg/dL (ref 70–99)

## 2023-12-03 LAB — TROPONIN I (HIGH SENSITIVITY): Troponin I (High Sensitivity): 17 ng/L (ref <18)

## 2023-12-03 MED ORDER — ACETAMINOPHEN 325 MG PO TABS: 650.0000 mg | ORAL_TABLET | Freq: Four times a day (QID) | ORAL | Status: DC | PRN

## 2023-12-03 MED ORDER — INSULIN ASPART 100 UNIT/ML IJ SOLN: 0.0000 [IU] | Freq: Three times a day (TID) | INTRAMUSCULAR | Status: DC

## 2023-12-03 MED ORDER — LOSARTAN POTASSIUM 50 MG PO TABS
100.0000 mg | ORAL_TABLET | Freq: Every day | ORAL | Status: DC
  Administered 2023-12-04 – 2023-12-08 (×5): 100 mg via ORAL
  Filled 2023-12-03 (×5): qty 2

## 2023-12-03 MED ORDER — INSULIN ASPART 100 UNIT/ML IJ SOLN: 0.0000 [IU] | Freq: Every day | INTRAMUSCULAR | Status: DC

## 2023-12-03 MED ORDER — FUROSEMIDE 10 MG/ML IJ SOLN
80.0000 mg | Freq: Once | INTRAMUSCULAR | Status: AC
  Administered 2023-12-03: 80 mg via INTRAVENOUS
  Filled 2023-12-03: qty 8

## 2023-12-03 MED ORDER — SODIUM CHLORIDE 0.9% FLUSH: 3.0000 mL | INTRAVENOUS | Status: DC | PRN

## 2023-12-03 MED ORDER — AMLODIPINE BESYLATE 5 MG PO TABS
5.0000 mg | ORAL_TABLET | ORAL | Status: DC
  Administered 2023-12-05 – 2023-12-08 (×2): 5 mg via ORAL
  Filled 2023-12-03 (×2): qty 1

## 2023-12-03 MED ORDER — SODIUM BICARBONATE 8.4 % IV SOLN
50.0000 meq | Freq: Once | INTRAVENOUS | Status: DC
  Filled 2023-12-03: qty 50

## 2023-12-03 MED ORDER — FAMOTIDINE 20 MG PO TABS
20.0000 mg | ORAL_TABLET | Freq: Every day | ORAL | Status: DC
  Administered 2023-12-04 – 2023-12-08 (×6): 20 mg via ORAL
  Filled 2023-12-03 (×6): qty 1

## 2023-12-03 MED ORDER — ASPIRIN 81 MG PO TBEC: 81.0000 mg | DELAYED_RELEASE_TABLET | Freq: Every day | ORAL | Status: DC

## 2023-12-03 MED ORDER — CALCIUM ACETATE (PHOS BINDER) 667 MG PO CAPS
667.0000 mg | ORAL_CAPSULE | Freq: Three times a day (TID) | ORAL | Status: DC
  Administered 2023-12-04 – 2023-12-08 (×13): 667 mg via ORAL
  Filled 2023-12-03 (×13): qty 1

## 2023-12-03 MED ORDER — SODIUM CHLORIDE 0.9% FLUSH
3.0000 mL | Freq: Two times a day (BID) | INTRAVENOUS | Status: DC
  Administered 2023-12-04 – 2023-12-07 (×6): 3 mL via INTRAVENOUS

## 2023-12-03 MED ORDER — SODIUM CHLORIDE 0.9% FLUSH
3.0000 mL | Freq: Two times a day (BID) | INTRAVENOUS | Status: DC
  Administered 2023-12-04 – 2023-12-08 (×9): 3 mL via INTRAVENOUS

## 2023-12-03 MED ORDER — ACETAMINOPHEN 325 MG PO TABS
650.0000 mg | ORAL_TABLET | Freq: Once | ORAL | Status: AC
  Administered 2023-12-04: 650 mg via ORAL
  Filled 2023-12-03: qty 2

## 2023-12-03 MED ORDER — ONDANSETRON HCL 4 MG/2ML IJ SOLN: 4.0000 mg | Freq: Four times a day (QID) | INTRAMUSCULAR | Status: DC | PRN

## 2023-12-03 MED ORDER — POLYETHYLENE GLYCOL 3350 17 G PO PACK: 17.0000 g | PACK | Freq: Two times a day (BID) | ORAL | Status: DC | PRN

## 2023-12-03 MED ORDER — TORSEMIDE 100 MG PO TABS: 100.0000 mg | ORAL_TABLET | Freq: Two times a day (BID) | ORAL | Status: DC

## 2023-12-03 MED ORDER — AMLODIPINE BESYLATE 5 MG PO TABS: 5.0000 mg | ORAL_TABLET | ORAL | Status: DC

## 2023-12-03 MED ORDER — INSULIN ASPART 100 UNIT/ML IV SOLN: 5.0000 [IU] | Freq: Once | INTRAVENOUS | Status: DC

## 2023-12-03 MED ORDER — SODIUM ZIRCONIUM CYCLOSILICATE 10 G PO PACK: 10.0000 g | PACK | Freq: Once | ORAL | Status: DC

## 2023-12-03 MED ORDER — CHLORHEXIDINE GLUCONATE CLOTH 2 % EX PADS
6.0000 | MEDICATED_PAD | Freq: Every day | CUTANEOUS | Status: DC
  Administered 2023-12-05 – 2023-12-08 (×3): 6 via TOPICAL

## 2023-12-03 MED ORDER — METOLAZONE 5 MG PO TABS
5.0000 mg | ORAL_TABLET | Freq: Every day | ORAL | Status: DC
  Administered 2023-12-04 – 2023-12-08 (×6): 5 mg via ORAL
  Filled 2023-12-03 (×7): qty 1

## 2023-12-03 MED ORDER — TAMSULOSIN HCL 0.4 MG PO CAPS
0.4000 mg | ORAL_CAPSULE | Freq: Every day | ORAL | Status: DC
  Administered 2023-12-04 – 2023-12-08 (×5): 0.4 mg via ORAL
  Filled 2023-12-03 (×5): qty 1

## 2023-12-03 MED ORDER — SODIUM CHLORIDE 0.9 % IV SOLN: 250.0000 mL | INTRAVENOUS | Status: AC | PRN

## 2023-12-03 MED ORDER — IPRATROPIUM-ALBUTEROL 0.5-2.5 (3) MG/3ML IN SOLN: 3.0000 mL | RESPIRATORY_TRACT | Status: DC | PRN

## 2023-12-03 MED ORDER — HYDRALAZINE HCL 25 MG PO TABS
25.0000 mg | ORAL_TABLET | Freq: Three times a day (TID) | ORAL | Status: DC
  Administered 2023-12-04 – 2023-12-08 (×13): 25 mg via ORAL
  Filled 2023-12-03 (×14): qty 1

## 2023-12-03 MED ORDER — ONDANSETRON HCL 4 MG PO TABS
4.0000 mg | ORAL_TABLET | Freq: Four times a day (QID) | ORAL | Status: DC | PRN
Start: 2023-12-03 — End: 2023-12-09

## 2023-12-03 MED ORDER — HYDRALAZINE HCL 25 MG PO TABS: 25.0000 mg | ORAL_TABLET | Freq: Three times a day (TID) | ORAL | Status: DC

## 2023-12-03 MED ORDER — CLONIDINE HCL 0.1 MG PO TABS: 0.1000 mg | ORAL_TABLET | Freq: Two times a day (BID) | ORAL | Status: DC | PRN

## 2023-12-03 MED ORDER — ACETAMINOPHEN 650 MG RE SUPP: 650.0000 mg | Freq: Four times a day (QID) | RECTAL | Status: DC | PRN

## 2023-12-03 MED ORDER — DEXTROSE 50 % IV SOLN: 1.0000 | Freq: Once | INTRAVENOUS | Status: DC

## 2023-12-03 NOTE — Progress Notes (Signed)
 Transported from ED4 to [facility] without complications.

## 2023-12-03 NOTE — ED Triage Notes (Signed)
 Pt here from nursing home with c/o aloc and sob , sats at nursing home 84% on 2 liters ,82 % on room air on arrival , pt placed on 5 liters , pt will resound to voice , pt missed dialysis today , Last seen well  10 pm last night

## 2023-12-03 NOTE — H&P (Signed)
 History and Physical    Christopher Davila WUJ:[id number] DOB: [birth year] DOA: 12/03/2023  PCP: Hoover Luz, MD   Patient coming from: ALF   Chief Complaint:  Chief Complaint  Patient presents with   Shortness of Breath   Altered Mental Status   ED TRIAGE note:  HPI:  Christopher Davila is a 53 y.o. male with medical history significant of non-insulin -dependent DM type II, ESRD on HD, grade 1 diastolic heart failure with preserved EF 60 to 65%, essential hypertension, chronic muscle fasciculation, reactive airway disease presented emergency department via EMS from nursing home with complaining of shortness of breath and altered mental status.  At presentation to ED patient required 3 to 4 L of oxygen.  He is also endorsing shortness of breath. Patient reported that he missed dialysis today.   During my evaluation at the bedside patient is in dialysis.  Tolerating dialysis well.  Patient reported shortness of breath has been much improved since BiPAP has been placed and being started on dialysis.  He denies any chest pain, palpitation, cough, fever, chill, nausea and vomiting.   ED Course:  At presentation to ED patient required BiPAP in the setting of acute hypoxic respiratory failure from volume overload from missing dialysis. CBC no evidence of leukocytosis, stable H&H and low platelet count 114. CMP showing elevated potassium 6.1, low chloride 92, evidence of ESRD, elevated anion gap 19. Elevated BNP around 1338. Normal troponin 17.  VBG showing pH 7.4, pCO2 43, PO230 and bicarb 29.  Elevated potassium 7.  Chest x-ray showed moderate cardiomegaly finding consistent pulmonary edema.  Small bilateral pleural effusion.  EKG showed normal sinus rhythm heart rate 86 and left posterior passiflora block.  Due to the pulmonary vascular congestion patient has been placed on BiPAP while in the ED.  Due to intermittent altered mental status head CT has been ordered however  patient has been taken to dialysis before the head CT scan performed.  Patient also received Lasix  40 mg IV. In the ED O2 sat dropped to 91% on 4.5 L oxygen.   Hospitalist has been consulted for further management of respiratory distress and volume overload in the setting of missing dialysis, hyperkalemia and acute metabolic encephalopathy.    Significant labs in the ED: Lab Orders         CBC with Differential         Comprehensive metabolic panel         Brain natriuretic peptide         Hepatitis B surface antigen         Hepatitis B surface antibody,quantitative         Hepatitis panel, acute         Renal function panel         HIV Antibody (routine testing w rflx)         CBC         Comprehensive metabolic panel         Ammonia         Rapid urine drug screen (hospital performed)         Basic metabolic panel         Hemoglobin A1c         I-Stat venous blood gas, ([facility] ED, [facility], [facility])         I-stat chem 8, ED (not at [facility], [facility] or [facility])         CBG monitoring, ED  Review of Systems:  Review of Systems  Constitutional:  Negative for chills, fever and weight loss.  Respiratory:  Positive for shortness of breath. Negative for cough, sputum production and wheezing.   Cardiovascular:  Negative for chest pain, palpitations, orthopnea, claudication, leg swelling and PND.  Gastrointestinal:  Negative for abdominal pain, heartburn, nausea and vomiting.  Musculoskeletal:  Negative for back pain, falls, joint pain, myalgias and neck pain.       Chronic muscle jerking  Neurological:  Negative for dizziness and headaches.  Psychiatric/Behavioral:  The patient is not nervous/anxious.     Past Medical History:  Diagnosis Date   Acute on chronic diastolic CHF (congestive heart failure) (HCC) 09/27/2019   Acute respiratory failure with hypoxia (HCC) 03/26/2021   Acute urinary retention 10/13/2019   Anemia    Blindness 09/04/2019   CHF (congestive heart failure) (HCC)     COVID-19    Diabetes mellitus    Diarrhea 10/14/2019   Epigastric abdominal pain    ESRD (end stage renal disease) (HCC)    Gastroparesis due to DM (HCC)    Hypertension    Hypertensive urgency 04/17/2019   Hypervolemia    Hypoalbuminemia 04/17/2019   Hypoxia    Metabolic acidosis, increased anion gap 10/13/2019   Multifocal pneumonia 05/06/2021   Neuropathy of lower extremity    Oral candidiasis 06/26/2012   Palliative care encounter    Pancreatitis 06/26/2012   Weight loss 06/26/2012    Past Surgical History:  Procedure Laterality Date   A/V SHUNT INTERVENTION N/A 10/21/2023   Procedure: A/V SHUNT INTERVENTION;  Surgeon: Patrick Boor, MD;  Location: [facility] INVASIVE CV LAB;  Service: Cardiovascular;  Laterality: N/A;  60% baslic vein   AV FISTULA PLACEMENT Right 10/29/2019   Procedure: RIGHT ARM BRACHIOBASILIC ARTERIOVENOUS (AV) FISTULA CREATION;  Surgeon: Mayo Speck, MD;  Location: [facility] OR;  Service: Vascular;  Laterality: Right;   BASCILIC VEIN TRANSPOSITION Right 01/05/2020   Procedure: RIGHT ARM SECOND STAGE BASCILIC VEIN TRANSPOSITION;  Surgeon: Mayo Speck, MD;  Location: [facility] OR;  Service: Vascular;  Laterality: Right;   EYE SURGERY     IR FLUORO GUIDE CV LINE RIGHT  10/28/2019   IR US  GUIDE VASC ACCESS RIGHT  10/28/2019     reports that he quit smoking about 8 years ago. His smoking use included cigarettes. He has never used smokeless tobacco. He reports that he does not currently use alcohol . He reports that he does not use drugs.  Allergies  Allergen Reactions   Morphine  Itching    Pt prefers not to be given this drug and Allergic, per [facility]    Family History  Problem Relation Age of Onset   Diabetes Mother    Lung disease Mother    Hypertension Mother    Diabetes Maternal Aunt    CAD Maternal Aunt    CAD Cousin     Prior to Admission medications   Medication Sig Start Date End Date Taking? Authorizing Provider  amLODipine  (NORVASC ) 5 MG tablet Take 5-10 mg by  mouth See admin instructions. Take 10 mg by mouth on Sun/Tues/Thurs/Sat and 5 mg on Mon/Wed/Fri 05/10/22  Yes [provider]  aspirin  EC 81 MG tablet Take 1 tablet (81 mg total) by mouth daily. 05/24/15  Yes Ghimire, Estil Heman, MD  brimonidine -timolol  (COMBIGAN ) 0.2-0.5 % ophthalmic solution Place 1 drop into both eyes in the morning and at bedtime.   Yes [provider]  calcium  acetate (PHOSLO ) 667 MG capsule Take  1 capsule (667 mg total) by mouth 3 (three) times daily with meals. 11/19/19  Yes Macdonald Savoy, MD  cloNIDine (CATAPRES) 0.1 MG tablet Take 0.1 mg by mouth every 12 (twelve) hours as needed (Hypertension).   Yes [provider]  famotidine  (PEPCID ) 20 MG tablet Take 20 mg by mouth at bedtime. 05/10/22  Yes [provider]  gabapentin  (NEURONTIN ) 300 MG capsule Take 300 mg by mouth daily.   Yes [provider]  hydrALAZINE  (APRESOLINE ) 25 MG tablet Take 25 mg by mouth 3 (three) times daily.   Yes [provider]  hyoscyamine (ANASPAZ) 0.125 MG TBDP disintergrating tablet Place 0.125 mg under the tongue every 4 (four) hours as needed (excess secretions). Not to exceed 6 doses in 24 hours.   Yes [provider]  latanoprost  (XALATAN ) 0.005 % ophthalmic solution Place 1 drop into the left eye at bedtime. 09/23/19  Yes Vann, Jessica U, DO  losartan  (COZAAR ) 100 MG tablet Take 1 tablet (100 mg total) by mouth daily. 11/19/19  Yes Macdonald Savoy, MD  Menthol, Topical Analgesic, 5 % GEL Apply 1 application  topically every 8 (eight) hours as needed (for pain).   Yes [provider]  metolazone  (ZAROXOLYN ) 5 MG tablet Take 5 mg by mouth at bedtime. 05/10/22  Yes [provider]  Nutritional Supplement LIQD Take 120 mLs by mouth See admin instructions. Med Pass 2.0 liquid: Drink 120 ml's by mouth twice daily on Sun/Tues/Thurs/Sat   Yes [provider]  Nutritional Supplements (NOVASOURCE RENAL) LIQD Take  237 mLs by mouth every Monday, Wednesday, and Friday.   Yes [provider]  ondansetron  (ZOFRAN ) 4 MG tablet Take 4 mg by mouth every 6 (six) hours as needed for nausea or vomiting.   Yes [provider]  oxyCODONE -acetaminophen  (PERCOCET/ROXICET) 5-325 MG tablet Take 1 tablet by mouth 4 (four) times daily.   Yes [provider]  OXYGEN Inhale 2 L/min into the lungs continuous.   Yes [provider]  polyethylene glycol (MIRALAX  / GLYCOLAX ) 17 g packet Take 17 g by mouth every 12 (twelve) hours as needed for mild constipation or moderate constipation.   Yes [provider]  tamsulosin (FLOMAX) 0.4 MG CAPS capsule Take 0.4 mg by mouth daily.   Yes [provider]  torsemide  (DEMADEX ) 100 MG tablet Take 100 mg by mouth 2 (two) times daily.   Yes [provider]     Physical Exam: Vitals:   12/03/23 2025 12/03/23 2045 12/03/23 2100 12/03/23 2115  BP: (!) 152/89 (!) 152/105 (!) 158/103 (!) 151/110  Pulse: 90 90 92 99  Resp: 20 (!) 21 11 13   Temp: 98.3 F (36.8 C)     TempSrc: Axillary     SpO2: 100% 96% 97% 98%    Physical Exam Vitals and nursing note reviewed.  Constitutional:      General: He is not in acute distress.    Appearance: He is not ill-appearing.   Cardiovascular:     Rate and Rhythm: Normal rate and regular rhythm.  Pulmonary:     Effort: Pulmonary effort is normal.     Breath sounds: Normal breath sounds. No decreased breath sounds, wheezing, rhonchi or rales.   Musculoskeletal:     Cervical back: Normal range of motion.     Right lower leg: No edema.     Left lower leg: No edema.   Skin:    General: Skin is warm.     Capillary Refill:  Capillary refill takes less than 2 seconds.   Neurological:     Mental Status: He is oriented to person, place, and time.   Psychiatric:        Mood and Affect: Mood normal. Mood is not anxious.        Behavior: Behavior is not agitated.      Labs on  Admission: I have personally reviewed following labs and imaging studies  CBC: Recent Labs  Lab 12/03/23 1711 12/03/23 1716 12/03/23 1717  WBC 7.7  --   --   NEUTROABS 5.6  --   --   HGB 11.9* 13.3 14.3  HCT 38.0* 39.0 42.0  MCV 104.7*  --   --   PLT 114*  --   --    Basic Metabolic Panel: Recent Labs  Lab 12/03/23 1711 12/03/23 1716 12/03/23 1717  NA 136 133* 134*  K 6.1* 7.0* 7.1*  CL 92*  --  96*  CO2 25  --   --   GLUCOSE 110*  --  107*  BUN 38*  --  53*  CREATININE 7.49*  --  7.30*  CALCIUM  10.3  --   --    GFR: CrCl cannot be calculated (Unknown ideal weight.). Liver Function Tests: Recent Labs  Lab 12/03/23 1711  AST 16  ALT 10  ALKPHOS 68  BILITOT 1.3*  PROT 8.2*  ALBUMIN  3.6   No results for input(s): LIPASE, AMYLASE in the last 168 hours. No results for input(s): AMMONIA in the last 168 hours. Coagulation Profile: No results for input(s): INR, PROTIME in the last 168 hours. Cardiac Enzymes: Recent Labs  Lab 12/03/23 1711  TROPONINIHS 17   BNP (last 3 results) Recent Labs    01/19/23 0741 12/03/23 1711  BNP 897.2* 1,338.8*   HbA1C: No results for input(s): HGBA1C in the last 72 hours. CBG: Recent Labs  Lab 12/03/23 1702  GLUCAP 88   Lipid Profile: No results for input(s): CHOL, HDL, LDLCALC, TRIG, CHOLHDL, LDLDIRECT in the last 72 hours. Thyroid  Function Tests: No results for input(s): TSH, T4TOTAL, FREET4, T3FREE, THYROIDAB in the last 72 hours. Anemia Panel: No results for input(s): VITAMINB12, FOLATE, FERRITIN, TIBC, IRON, RETICCTPCT in the last 72 hours. Urine analysis:    Component Value Date/Time   COLORURINE YELLOW 05/07/2022 1826   APPEARANCEUR CLEAR 05/07/2022 1826   LABSPEC 1.012 05/07/2022 1826   PHURINE 8.0 05/07/2022 1826   GLUCOSEU 50 (A) 05/07/2022 1826   HGBUR NEGATIVE 05/07/2022 1826   BILIRUBINUR NEGATIVE 05/07/2022 1826   KETONESUR NEGATIVE 05/07/2022 1826    PROTEINUR >=300 (A) 05/07/2022 1826   UROBILINOGEN 0.2 12/13/2014 2056   NITRITE NEGATIVE 05/07/2022 1826   LEUKOCYTESUR NEGATIVE 05/07/2022 1826    Radiological Exams on Admission: I have personally reviewed images DG Chest Portable 1 View Result Date: 12/03/2023 CLINICAL DATA:  sob EXAM: PORTABLE CHEST - 1 VIEW COMPARISON:  January 22, 2023 FINDINGS: Diffuse interstitial opacities throughout both lungs. Hazy perihilar airspace opacities within both mid and lower lung zones. Small to moderate layering right pleural effusion again noted. Trace left pleural effusion. No pneumothorax. Moderate cardiomegaly. No acute fracture or destructive lesion. IMPRESSION: Moderate cardiomegaly with findings of pulmonary edema. Small to moderate layering right and trace left pleural effusions again noted. Electronically Signed   By: Rance Burrows M.D.   On: 12/03/2023 18:03     EKG: My personal interpretation of EKG shows: normal sinus rhythm heart rate 86 and left posterior fascicular block.    Assessment/Plan:  Principal Problem:   Volume overload Active Problems:   ESRD on dialysis (HCC)   Acute on chronic diastolic CHF (congestive heart failure) (HCC)   Essential hypertension   Acute metabolic encephalopathy   Reactive airway disease   Non-insulin  dependent type 2 diabetes mellitus (HCC)    Assessment and Plan: Volume overload and respiratory distress secondary to missing dialysis Acute on chronic diastolic heart failure exacerbation Essential hypertension ESRD on HD -Patient presented to ED via EMS with complaining of volume overload, shortness of breath need to garment of oxygen and missing dialysis today.  Also nursing home reported patient has intermittent confusion/lethargy. - At presentation to ED O2 sat dropped to 91% on 4.5 L oxygen.  Physical exam showing volume overload.  Patient has been placed on BiPAP.  Otherwise hemodynamically stable. - CBC showing low platelet count 114.  CMP  elevated potassium 6.1 evidence of ESRD.  Elevated anion gap 19 and normal bicarb level 25.  VBG pH 7.4, normal pCO2 and slightly low PO2 level 30.  Elevated potassium 7. -Chest x-ray showed cardiomegaly pulmonary vascular congestion Elevated BNP around 1388. -In the ED patient has been given Lasix  80 mg once. -Continue home blood pressure regimen include amlodipine , losartan . -Holding torsemide  as patient has received IV Lasix .  Based on the volume status can continue IV Lasix  twice daily vs resume torsemide  from tomorrow. - Nephrology has been consulted plan for dialysis tonight. - Once volume status will improve can transition BiPAP to nasal cannula oxygen. - Strict I's/O, daily weight.  Hyperkalemia -Elevated potassium 6.1.  EKG no corresponding change with hyperkalemia.  Patient underwent dialysis tonight.  Will repeat potassium level in the meantime.  Acute metabolic encephalopathy -In the ED patient is lethargic.  Concern for acute metabolic encephalopathy in the setting of volume overload versus uremia.  Checking ammonia level. - In the ED head CT scan has been ordered to rule out any acute intracranial abnormality..  Holding aspirin  and pharmacological DVT prophylaxis until head CT rules out any intracranial bleeding.  History of reactive airway disease -Presently on BiPAP due to volume overload which is causing respiratory distress.  Eventually can wean down BiPAP nasal cannula oxygen. - Unable to find out cause of reactive airway disease COPD/asthma. - Continue albuterol  as needed for any wheezing shortness of breath.  Non-insulin -dependent DM type II-diet controlled -A1c 5.9.  Continue carb modified diet with POC blood glucose check.    History of chronic generalized muscle fasciculation   DVT prophylaxis:  SCDs.  Holding pharmacological DVT prophylaxis until CT head to rule out any intracranial bleeding. Code Status:  Full Code Diet: Carb modified and renal  diet Disposition Plan: Continue to monitor improvement of volume status with dialysis. Consults: Nephrology Admission status:   Inpatient, Step Down Unit  Severity of Illness: The appropriate patient status for this patient is INPATIENT. Inpatient status is judged to be reasonable and necessary in order to provide the required intensity of service to ensure the patient's safety. The patient's presenting symptoms, physical exam findings, and initial radiographic and laboratory data in the context of their chronic comorbidities is felt to place them at high risk for further clinical deterioration. Furthermore, it is not anticipated that the patient will be medically stable for discharge from the hospital within 2 midnights of admission.   * I certify that at the point of admission it is my clinical judgment that the patient will require inpatient hospital care spanning beyond 2 midnights from the point of admission due  to high intensity of service, high risk for further deterioration and high frequency of surveillance required.Aaron Aas    Michalla Ringer, MD [facility]  How to contact the [facility] Attending or Consulting provider 7A - 7P or covering provider during after hours 7P -7A, for this patient.  Check the care team in [facility] and look for a) attending/consulting [facility] provider listed and b) the [facility] team listed Log into [email or web address] and use [place]'s universal password to access. If you do not have the password, please contact the hospital operator. Locate the [facility] provider you are looking for under [facility] and page to a number that you can be directly reached. If you still have difficulty reaching the provider, please page the [facility] (Director on Call) for the Hospitalists listed on amion for assistance.  12/03/2023, 9:58 PM

## 2023-12-03 NOTE — Consult Note (Signed)
 [facility] Renal Consultation Note  Requesting MD: Lesle Ras, DO Indication for Consultation:  ESRD  Chief complaint: short of breath and confused   HPI:  Christopher Davila is a 53 y.o. male with a history of end-stage renal disease diabetes, hypertension, and chronic hypoxic respiratory failure on home oxygen who presented to the hospital with shortness of breath and altered mental status.  Per triage vitals, the patient's oxygen saturation at the nursing home was 84% on 2 L.  He was also found to be somnolent.  He was 82% on room air on arrival here and he was placed on 5 L of oxygen.  He had last been seen well the evening prior per charting.  He reports missing dialysis.  He was placed on BiPAP by the ER.  He was found to be hyperkalemic with a potassium of 7.1.  Nephrology is consulted for assistance with management of end-stage renal disease with hyperkalemia and AMS.  Note that he has frequently shortened treatments recently and has not been getting to his dry weight.  He provides little additional history.  May be limited with BIPAP.  He is able to provide his name, year, and location however answers 2025 when asked other questions.     PMHx:   Past Medical History:  Diagnosis Date   Acute on chronic diastolic CHF (congestive heart failure) (HCC) 09/27/2019   Acute respiratory failure with hypoxia (HCC) 03/26/2021   Acute urinary retention 10/13/2019   Anemia    Blindness 09/04/2019   CHF (congestive heart failure) (HCC)    COVID-19    Diabetes mellitus    Diarrhea 10/14/2019   Epigastric abdominal pain    ESRD (end stage renal disease) (HCC)    Gastroparesis due to DM (HCC)    Hypertension    Hypertensive urgency 04/17/2019   Hypervolemia    Hypoalbuminemia 04/17/2019   Hypoxia    Metabolic acidosis, increased anion gap 10/13/2019   Multifocal pneumonia 05/06/2021   Neuropathy of lower extremity    Oral candidiasis 06/26/2012   Palliative care  encounter    Pancreatitis 06/26/2012   Weight loss 06/26/2012    Past Surgical History:  Procedure Laterality Date   A/V SHUNT INTERVENTION N/A 10/21/2023   Procedure: A/V SHUNT INTERVENTION;  Surgeon: Patrick Boor, MD;  Location: [facility] INVASIVE CV LAB;  Service: Cardiovascular;  Laterality: N/A;  60% baslic vein   AV FISTULA PLACEMENT Right 10/29/2019   Procedure: RIGHT ARM BRACHIOBASILIC ARTERIOVENOUS (AV) FISTULA CREATION;  Surgeon: Mayo Speck, MD;  Location: [facility] OR;  Service: Vascular;  Laterality: Right;   BASCILIC VEIN TRANSPOSITION Right 01/05/2020   Procedure: RIGHT ARM SECOND STAGE BASCILIC VEIN TRANSPOSITION;  Surgeon: Mayo Speck, MD;  Location: [facility] OR;  Service: Vascular;  Laterality: Right;   EYE SURGERY     IR FLUORO GUIDE CV LINE RIGHT  10/28/2019   IR US  GUIDE VASC ACCESS RIGHT  10/28/2019    Family Hx:  Family History  Problem Relation Age of Onset   Diabetes Mother    Lung disease Mother    Hypertension Mother    Diabetes Maternal Aunt    CAD Maternal Aunt    CAD Cousin     Social History:  reports that he quit smoking about 8 years ago. His smoking use included cigarettes. He has never used smokeless tobacco. He reports that he does not currently use alcohol . He reports that he does not use drugs.  Allergies:  Allergies  Allergen  Reactions   Morphine  Itching    Pt prefers not to be given this drug and Allergic, per [facility]    Medications: Prior to Admission medications   Medication Sig Start Date End Date Taking? Authorizing Provider  amLODipine  (NORVASC ) 5 MG tablet Take 5-10 mg by mouth See admin instructions. Take 10 mg by mouth on Sun/Tues/Thurs/Sat and 5 mg on Mon/Wed/Fri 05/10/22  Yes [provider]  aspirin  EC 81 MG tablet Take 1 tablet (81 mg total) by mouth daily. 05/24/15  Yes Ghimire, Estil Heman, MD  brimonidine -timolol  (COMBIGAN ) 0.2-0.5 % ophthalmic solution Place 1 drop into both eyes in the morning and at bedtime.   Yes [provider]  calcium  acetate (PHOSLO ) 667 MG capsule Take 1 capsule (667 mg total) by mouth 3 (three) times daily with meals. 11/19/19  Yes Macdonald Savoy, MD  cloNIDine (CATAPRES) 0.1 MG tablet Take 0.1 mg by mouth every 12 (twelve) hours as needed (Hypertension).   Yes [provider]  famotidine  (PEPCID ) 20 MG tablet Take 20 mg by mouth at bedtime. 05/10/22  Yes [provider]  gabapentin  (NEURONTIN ) 300 MG capsule Take 300 mg by mouth daily.   Yes [provider]  hydrALAZINE  (APRESOLINE ) 25 MG tablet Take 25 mg by mouth 3 (three) times daily.   Yes [provider]  hyoscyamine (ANASPAZ) 0.125 MG TBDP disintergrating tablet Place 0.125 mg under the tongue every 4 (four) hours as needed (excess secretions). Not to exceed 6 doses in 24 hours.   Yes [provider]  latanoprost  (XALATAN ) 0.005 % ophthalmic solution Place 1 drop into the left eye at bedtime. 09/23/19  Yes Vann, Jessica U, DO  losartan  (COZAAR ) 100 MG tablet Take 1 tablet (100 mg total) by mouth daily. 11/19/19  Yes Macdonald Savoy, MD  Menthol, Topical Analgesic, 5 % GEL Apply 1 application  topically every 8 (eight) hours as needed (for pain).   Yes [provider]  metolazone  (ZAROXOLYN ) 5 MG tablet Take 5 mg by mouth at bedtime. 05/10/22  Yes [provider]  Nutritional Supplement LIQD Take 120 mLs by mouth See admin instructions. Med Pass 2.0 liquid: Drink 120 ml's by mouth twice daily on Sun/Tues/Thurs/Sat   Yes [provider]  Nutritional Supplements (NOVASOURCE RENAL) LIQD Take 237 mLs by mouth every Monday, Wednesday, and Friday.   Yes [provider]  ondansetron  (ZOFRAN ) 4 MG tablet Take 4 mg by mouth every 6 (six) hours as needed for nausea or vomiting.   Yes [provider]  oxyCODONE -acetaminophen  (PERCOCET/ROXICET) 5-325 MG tablet Take 1 tablet by mouth 4 (four) times daily.   Yes [provider]  OXYGEN Inhale 2  L/min into the lungs continuous.   Yes [provider]  polyethylene glycol (MIRALAX  / GLYCOLAX ) 17 g packet Take 17 g by mouth every 12 (twelve) hours as needed for mild constipation or moderate constipation.   Yes [provider]  tamsulosin (FLOMAX) 0.4 MG CAPS capsule Take 0.4 mg by mouth daily.   Yes [provider]  torsemide  (DEMADEX ) 100 MG tablet Take 100 mg by mouth 2 (two) times daily.   Yes [provider]    I have reviewed the patient's current and reported prior to admission medications.  Labs:     Latest Ref Rng & Units 12/03/2023    5:17 PM 12/03/2023    5:16 PM 12/03/2023    5:11 PM  BMP  Glucose 70 - 99 mg/dL 536   644  BUN 6 - 20 mg/dL 53   38   Creatinine 1.61 - 1.24 mg/dL 0.96   0.45   Sodium 409 - 145 mmol/L 134  133  136   Potassium 3.5 - 5.1 mmol/L 7.1  7.0  6.1   Chloride 98 - 111 mmol/L 96   92   CO2 22 - 32 mmol/L   25   Calcium  8.9 - 10.3 mg/dL   81.1     Urinalysis    Component Value Date/Time   COLORURINE YELLOW 05/07/2022 1826   APPEARANCEUR CLEAR 05/07/2022 1826   LABSPEC 1.012 05/07/2022 1826   PHURINE 8.0 05/07/2022 1826   GLUCOSEU 50 (A) 05/07/2022 1826   HGBUR NEGATIVE 05/07/2022 1826   BILIRUBINUR NEGATIVE 05/07/2022 1826   KETONESUR NEGATIVE 05/07/2022 1826   PROTEINUR >=300 (A) 05/07/2022 1826   UROBILINOGEN 0.2 12/13/2014 2056   NITRITE NEGATIVE 05/07/2022 1826   LEUKOCYTESUR NEGATIVE 05/07/2022 1826     ROS: Limited secondary to altered mental status    Physical Exam: Vitals:   12/03/23 1926 12/03/23 1930  BP:  (!) 137/99  Pulse: 83 78  Resp: 17 12  Temp:  98.4 F (36.9 C)  SpO2: 100% 100%     General: Adult male in stretcher in no acute distress on BiPAP HEENT: Normocephalic atraumatic Eyes: Extraocular movements intact; grossly decreased visual acuity Neck: Supple trachea midline Heart: S1-S2 no rub Lungs: Clear to auscultation anteriorly with normal work of breathing on BiPAP  while on HD Abdomen: Soft nontender nondistended Extremities: 1+ edema no cyanosis or clubbing Skin: No rash on extremities exposed Neuro: Patient is oriented to person and to year and provides the year multiple times when asked questions Psych no anxiety or agitation Access right AV fistula in use  Outpatient HD orders:  [facility] Monday Wednesday Friday 4 Hours 400 ml/min blood flow 600 mL/min dialysate flow Dry weight 64 kg 2K/2.0 calcium  AV fistula Medications:  Mircera 50 mcg every 2 weeks -note this is ordered but has not recently been given.  (Last administration mircera 50 mcg on 4/4) Hemoglobin 10.8 on 6/16 Venofer 50 mg weekly Hectorol  6 mcg every treatment   Assessment/Plan:   # Hyperkalemia - HD stat - spoke with HD RN - 1K bath for 2 hours then transition to 2K - Lasix  in the ER - Lokelma  once - 1 amp of bicarb to temporize  # ESRD - HD stat - spoke with HD RN  # Altered mental status/encephalopathy - Optimize clearance with HD  - Per report, patient missed a treatment  # Acute on chronic respiratory failure - On BiPAP - Usually on 3-4 L oxygen at SNF per charting - Optimize volume with HD  # Hypertension - Optimize volume status with dialysis  # Metabolic bone disease - Check phos in AM  - Resume outpatient regimen when taking p.o.   Thank you for the consult.  Please do not hesitate to contact me with any questions regarding our patient  Nan Aver 12/03/2023, 9:10 PM

## 2023-12-03 NOTE — ED Provider Notes (Signed)
 [place] EMERGENCY DEPARTMENT AT [facility] Provider Note   CSN: [id number] Arrival date & time: 12/03/23  1653     Patient presents with: Shortness of Breath and Altered Mental Status   Christopher Davila is a 53 y.o. male.   Patient here with EMS for shortness of breath altered mental status.  History of diabetes hypertension gastroparesis end-stage renal disease on hemodialysis.  He states he still makes urine.  He missed dialysis recently per EMS.  He is little bit sleepy on exam.  Sounds like he does wear oxygen at baseline maybe 3 to 4 L.  He denies any chest pain or abdominal pain.  No fever.  Per EMS staff found him to be a little bit more sleepy as the day went on today.  Does state he still makes urine.  The history is provided by the patient.       Prior to Admission medications   Medication Sig Start Date End Date Taking? Authorizing Provider  amLODipine  (NORVASC ) 5 MG tablet Take 5-10 mg by mouth See admin instructions. Take 10 mg by mouth on Sun/Tues/Thurs/Sat and 5 mg on Mon/Wed/Fri 05/10/22  Yes [provider]  aspirin  EC 81 MG tablet Take 1 tablet (81 mg total) by mouth daily. 05/24/15  Yes Ghimire, Estil Heman, MD  brimonidine -timolol  (COMBIGAN ) 0.2-0.5 % ophthalmic solution Place 1 drop into both eyes in the morning and at bedtime.   Yes [provider]  famotidine  (PEPCID ) 20 MG tablet Take 20 mg by mouth at bedtime. 05/10/22  Yes [provider]  gabapentin  (NEURONTIN ) 300 MG capsule Take 300 mg by mouth daily.   Yes [provider]  latanoprost  (XALATAN ) 0.005 % ophthalmic solution Place 1 drop into the left eye at bedtime. 09/23/19  Yes Vann, Jessica U, DO  losartan  (COZAAR ) 100 MG tablet Take 1 tablet (100 mg total) by mouth daily. 11/19/19  Yes Macdonald Savoy, MD  metolazone  (ZAROXOLYN ) 5 MG tablet Take 5 mg by mouth at bedtime. 05/10/22  Yes [provider]  Nutritional Supplement LIQD Take 120 mLs by  mouth See admin instructions. Med Pass 2.0 liquid: Drink 120 ml's by mouth twice daily on Sun/Tues/Thurs/Sat   Yes [provider]  Nutritional Supplements (NOVASOURCE RENAL) LIQD Take 237 mLs by mouth every Monday, Wednesday, and Friday.   Yes [provider]  OXYGEN Inhale 2 L/min into the lungs continuous.   Yes [provider]  tamsulosin (FLOMAX) 0.4 MG CAPS capsule Take 0.4 mg by mouth daily.   Yes [provider]  torsemide  (DEMADEX ) 100 MG tablet Take 100 mg by mouth 2 (two) times daily.   Yes [provider]  calcium  acetate (PHOSLO ) 667 MG capsule Take 1 capsule (667 mg total) by mouth 3 (three) times daily with meals. 11/19/19   Macdonald Savoy, MD  cloNIDine (CATAPRES) 0.1 MG tablet Take 0.1 mg by mouth every 12 (twelve) hours as needed (Hypertension). Give for SBP greater than 160 and DBP greater than 100 check BP after administration.    [provider]  hydrALAZINE  (APRESOLINE ) 25 MG tablet Take 25 mg by mouth 3 (three) times daily.    [provider]  hyoscyamine (ANASPAZ) 0.125 MG TBDP disintergrating tablet Place 0.125 mg under the tongue every 4 (four) hours as needed (excess secretions). Not to exceed 6 doses in 24 hours.    [provider]  Menthol, Topical Analgesic, 5 % GEL Apply 1 application. topically every 8 (eight)  hours as needed (hand/wrist pain).    [provider]  ondansetron  (ZOFRAN ) 4 MG tablet Take 4 mg by mouth every 6 (six) hours as needed for nausea or vomiting.    [provider]  oxyCODONE -acetaminophen  (PERCOCET/ROXICET) 5-325 MG tablet Take 1 tablet by mouth 4 (four) times daily.    [provider]  polyethylene glycol (MIRALAX  / GLYCOLAX ) 17 g packet Take 17 g by mouth every 12 (twelve) hours as needed for mild constipation or moderate constipation.    [provider]    Allergies: Morphine     Review of Systems  Updated Vital Signs BP (!)  151/104   Pulse 83   Temp 98.4 F (36.9 C)   Resp 17   SpO2 100%   Physical Exam Vitals and nursing note reviewed.  Constitutional:      General: He is not in acute distress.    Appearance: He is well-developed. He is not ill-appearing.  HENT:     Head: Normocephalic and atraumatic.   Eyes:     Conjunctiva/sclera: Conjunctivae normal.     Pupils: Pupils are equal, round, and reactive to light.    Cardiovascular:     Rate and Rhythm: Normal rate and regular rhythm.     Pulses: Normal pulses.     Heart sounds: Normal heart sounds. No murmur heard. Pulmonary:     Effort: Pulmonary effort is normal. No respiratory distress.     Breath sounds: Decreased breath sounds present.  Abdominal:     Palpations: Abdomen is soft.     Tenderness: There is no abdominal tenderness.   Musculoskeletal:        General: No swelling.     Cervical back: Normal range of motion and neck supple.     Right lower leg: Edema present.     Left lower leg: Edema present.   Skin:    General: Skin is warm and dry.     Capillary Refill: Capillary refill takes less than 2 seconds.   Neurological:     General: No focal deficit present.     Mental Status: He is alert.   Psychiatric:        Mood and Affect: Mood normal.     (all labs ordered are listed, but only abnormal results are displayed) Labs Reviewed  CBC WITH DIFFERENTIAL/PLATELET - Abnormal; Notable for the following components:      Result Value   RBC 3.63 (*)    Hemoglobin 11.9 (*)    HCT 38.0 (*)    MCV 104.7 (*)    RDW 16.0 (*)    Platelets 114 (*)    Abs Immature Granulocytes 0.13 (*)    All other components within normal limits  COMPREHENSIVE METABOLIC PANEL WITH GFR - Abnormal; Notable for the following components:   Potassium 6.1 (*)    Chloride 92 (*)    Glucose, Bld 110 (*)    BUN 38 (*)    Creatinine, Ser 7.49 (*)    Total Protein 8.2 (*)    Total Bilirubin 1.3 (*)    GFR, Estimated 8 (*)    Anion gap 19 (*)    All  other components within normal limits  BRAIN NATRIURETIC PEPTIDE - Abnormal; Notable for the following components:   B Natriuretic Peptide 1,338.8 (*)    All other components within normal limits  I-STAT VENOUS BLOOD GAS, ED - Abnormal; Notable for the following components:   pH, Ven 7.446 (*)    pCO2, Ven 43.2 (*)  pO2, Ven 30 (*)    Bicarbonate 29.8 (*)    Acid-Base Excess 5.0 (*)    Sodium 133 (*)    Potassium 7.0 (*)    Calcium , Ion 1.08 (*)    All other components within normal limits  I-STAT CHEM 8, ED - Abnormal; Notable for the following components:   Sodium 134 (*)    Potassium 7.1 (*)    Chloride 96 (*)    BUN 53 (*)    Creatinine, Ser 7.30 (*)    Glucose, Bld 107 (*)    Calcium , Ion 1.08 (*)    All other components within normal limits  HEPATITIS B SURFACE ANTIGEN  HEPATITIS B SURFACE ANTIBODY, QUANTITATIVE  CBG MONITORING, ED  TROPONIN I (HIGH SENSITIVITY)  TROPONIN I (HIGH SENSITIVITY)    EKG: EKG Interpretation Date/Time:  Wednesday December 03 2023 16:59:16 EDT Ventricular Rate:  86 PR Interval:  166 QRS Duration:  94 QT Interval:  389 QTC Calculation: 466 R Axis:   145  Text Interpretation: Sinus rhythm Left posterior fascicular block Confirmed by Lowery Rue [phone]) on 12/03/2023 5:14:23 PM  Radiology: Lenell Query Chest Portable 1 View Result Date: 12/03/2023 CLINICAL DATA:  sob EXAM: PORTABLE CHEST - 1 VIEW COMPARISON:  January 22, 2023 FINDINGS: Diffuse interstitial opacities throughout both lungs. Hazy perihilar airspace opacities within both mid and lower lung zones. Small to moderate layering right pleural effusion again noted. Trace left pleural effusion. No pneumothorax. Moderate cardiomegaly. No acute fracture or destructive lesion. IMPRESSION: Moderate cardiomegaly with findings of pulmonary edema. Small to moderate layering right and trace left pleural effusions again noted. Electronically Signed   By: Rance Burrows M.D.   On: 12/03/2023 18:03      Procedures   Medications Ordered in the ED  Chlorhexidine  Gluconate Cloth 2 % PADS 6 each (has no administration in time range)  sodium zirconium cyclosilicate  (LOKELMA ) packet 10 g (10 g Oral Not Given 12/03/23 1856)  sodium bicarbonate  injection 50 mEq (has no administration in time range)  acetaminophen  (TYLENOL ) tablet 650 mg (has no administration in time range)  furosemide  (LASIX ) injection 80 mg (80 mg Intravenous Given 12/03/23 1803)                                    Medical Decision Making Amount and/or Complexity of Data Reviewed Labs: ordered. Radiology: ordered.  Risk OTC drugs. Prescription drug management. Decision regarding hospitalization.   Coleman Daughters is here with altered mental status and shortness of breath.  History of diabetes hypertension gastroparesis chronic respiratory failure on 3 to 4 L of oxygen, blind, CHF.  He arrives on 5 L of oxygen tachypneic.  He is a little bit somnolent but I am able to get him to follow commands.  Placed him on BiPAP given the concern for volume overload.  He recently missed his last dialysis session.  Differential diagnosis likely volume overload in the setting of heart failure/ESRD seems less likely to be infectious process.  Suspect some uremia symptoms.  He has no fever.  Doubt infectious process.  Is not having any chest pain.  Does not seem to have any history of trauma.  He can move all 4 extremities.  Will get blood gas basic labs BMP troponin chest x-ray head CT.  Will give dose IV Lasix .  Blood pressure is 130 systolic.  Ultimately blood work with a potassium 6.1.  No hyperkalemic changes  on EKG.  Will give him Lokelma  insulin  and dextrose .  pH is reassuring with a pH of 7.4.  CO2 is 43.  Troponin is normal at 17.  Chest x-ray consistent with volume overload.  Overall likely respiratory failure in the setting of missed dialysis volume overload.  Will talk with nephrology.    Overall patient very much improved  on BiPAP.  Feels more comfortable.  following questions and commands much better.  Patient did end up going to dialysis pretty quickly.  He went before his head CT was done.  I have very low suspicion that there is any intracranial process going on but reasonable to get a head CT when able.  Ultimately had clinical improvement while on BiPAP.  Labs and images suggestive of volume overload.  Chest x-ray with pulmonary edema.  BNP 1400.  Troponin 17.  Overall we will admit to hospitalist for further care.  Patient currently at dialysis.  Hemodynamically stable prior to going there.  This chart was dictated using voice recognition software.  Despite best efforts to proofread,  errors can occur which can change the documentation meaning.        Final diagnoses:  Altered mental status, unspecified altered mental status type  SOB (shortness of breath)  Acute respiratory failure with hypoxia [facility])    ED Discharge Orders     None          Lowery Rue, DO 12/03/23 2012

## 2023-12-04 ENCOUNTER — Inpatient Hospital Stay (HOSPITAL_COMMUNITY)

## 2023-12-04 DIAGNOSIS — I5033 Acute on chronic diastolic (congestive) heart failure: Secondary | ICD-10-CM

## 2023-12-04 DIAGNOSIS — J9601 Acute respiratory failure with hypoxia: Secondary | ICD-10-CM | POA: Diagnosis not present

## 2023-12-04 DIAGNOSIS — Z992 Dependence on renal dialysis: Secondary | ICD-10-CM | POA: Diagnosis not present

## 2023-12-04 DIAGNOSIS — N186 End stage renal disease: Secondary | ICD-10-CM | POA: Diagnosis not present

## 2023-12-04 LAB — ECHOCARDIOGRAM COMPLETE
AR max vel: 1.89 cm2
AV Area VTI: 1.91 cm2
AV Area mean vel: 1.78 cm2
AV Mean grad: 4 mmHg
AV Peak grad: 6.9 mmHg
Ao pk vel: 1.31 m/s
Area-P 1/2: 5.02 cm2
S' Lateral: 2.1 cm

## 2023-12-04 LAB — COMPREHENSIVE METABOLIC PANEL WITH GFR
ALT: 13 U/L (ref 0–44)
AST: 19 U/L (ref 15–41)
Albumin: 3.3 g/dL — ABNORMAL LOW (ref 3.5–5.0)
Alkaline Phosphatase: 68 U/L (ref 38–126)
Anion gap: 19 — ABNORMAL HIGH (ref 5–15)
BUN: 23 mg/dL — ABNORMAL HIGH (ref 6–20)
CO2: 26 mmol/L (ref 22–32)
Calcium: 9.7 mg/dL (ref 8.9–10.3)
Chloride: 89 mmol/L — ABNORMAL LOW (ref 98–111)
Creatinine, Ser: 5.14 mg/dL — ABNORMAL HIGH (ref 0.61–1.24)
GFR, Estimated: 13 mL/min — ABNORMAL LOW (ref >60)
Glucose, Bld: 87 mg/dL (ref 70–99)
Potassium: 4.3 mmol/L (ref 3.5–5.1)
Sodium: 134 mmol/L — ABNORMAL LOW (ref 135–145)
Total Bilirubin: 1.7 mg/dL — ABNORMAL HIGH (ref 0.0–1.2)
Total Protein: 7.8 g/dL (ref 6.5–8.1)

## 2023-12-04 LAB — GLUCOSE, CAPILLARY
Glucose-Capillary: 100 mg/dL — ABNORMAL HIGH (ref 70–99)
Glucose-Capillary: 164 mg/dL — ABNORMAL HIGH (ref 70–99)

## 2023-12-04 LAB — CBC
HCT: 37 % — ABNORMAL LOW (ref 39.0–52.0)
Hemoglobin: 11.8 g/dL — ABNORMAL LOW (ref 13.0–17.0)
MCH: 32.9 pg (ref 26.0–34.0)
MCHC: 31.9 g/dL (ref 30.0–36.0)
MCV: 103.1 fL — ABNORMAL HIGH (ref 80.0–100.0)
Platelets: 115 10*3/uL — ABNORMAL LOW (ref 150–400)
RBC: 3.59 MIL/uL — ABNORMAL LOW (ref 4.22–5.81)
RDW: 15.9 % — ABNORMAL HIGH (ref 11.5–15.5)
WBC: 11.2 10*3/uL — ABNORMAL HIGH (ref 4.0–10.5)
nRBC: 0 % (ref 0.0–0.2)

## 2023-12-04 LAB — RENAL FUNCTION PANEL
Albumin: 3.3 g/dL — ABNORMAL LOW (ref 3.5–5.0)
Anion gap: 19 — ABNORMAL HIGH (ref 5–15)
BUN: 23 mg/dL — ABNORMAL HIGH (ref 6–20)
CO2: 26 mmol/L (ref 22–32)
Calcium: 9.7 mg/dL (ref 8.9–10.3)
Chloride: 90 mmol/L — ABNORMAL LOW (ref 98–111)
Creatinine, Ser: 5.04 mg/dL — ABNORMAL HIGH (ref 0.61–1.24)
GFR, Estimated: 13 mL/min — ABNORMAL LOW (ref >60)
Glucose, Bld: 86 mg/dL (ref 70–99)
Phosphorus: 5.2 mg/dL — ABNORMAL HIGH (ref 2.5–4.6)
Potassium: 4.4 mmol/L (ref 3.5–5.1)
Sodium: 135 mmol/L (ref 135–145)

## 2023-12-04 LAB — BASIC METABOLIC PANEL WITH GFR
Anion gap: 17 — ABNORMAL HIGH (ref 5–15)
BUN: 26 mg/dL — ABNORMAL HIGH (ref 6–20)
CO2: 26 mmol/L (ref 22–32)
Calcium: 9.9 mg/dL (ref 8.9–10.3)
Chloride: 91 mmol/L — ABNORMAL LOW (ref 98–111)
Creatinine, Ser: 5.54 mg/dL — ABNORMAL HIGH (ref 0.61–1.24)
GFR, Estimated: 12 mL/min — ABNORMAL LOW (ref >60)
Glucose, Bld: 89 mg/dL (ref 70–99)
Potassium: 4.7 mmol/L (ref 3.5–5.1)
Sodium: 134 mmol/L — ABNORMAL LOW (ref 135–145)

## 2023-12-04 LAB — HEMOGLOBIN A1C
Hgb A1c MFr Bld: 6.5 % — ABNORMAL HIGH (ref 4.8–5.6)
Mean Plasma Glucose: 139.85 mg/dL

## 2023-12-04 LAB — HIV ANTIBODY (ROUTINE TESTING W REFLEX): HIV Screen 4th Generation wRfx: NONREACTIVE

## 2023-12-04 LAB — CBG MONITORING, ED: Glucose-Capillary: 90 mg/dL (ref 70–99)

## 2023-12-04 LAB — AMMONIA: Ammonia: 33 umol/L (ref 9–35)

## 2023-12-04 MED ORDER — GABAPENTIN 300 MG PO CAPS
300.0000 mg | ORAL_CAPSULE | Freq: Every day | ORAL | Status: DC
  Administered 2023-12-04 – 2023-12-08 (×5): 300 mg via ORAL
  Filled 2023-12-04 (×5): qty 1

## 2023-12-04 MED ORDER — OXYCODONE-ACETAMINOPHEN 5-325 MG PO TABS
1.0000 | ORAL_TABLET | Freq: Four times a day (QID) | ORAL | Status: DC | PRN
  Administered 2023-12-04 – 2023-12-08 (×15): 1 via ORAL
  Filled 2023-12-04 (×15): qty 1

## 2023-12-04 MED ORDER — AMLODIPINE BESYLATE 10 MG PO TABS
10.0000 mg | ORAL_TABLET | ORAL | Status: DC
  Administered 2023-12-04 – 2023-12-07 (×3): 10 mg via ORAL
  Filled 2023-12-04 (×3): qty 1
  Filled 2023-12-04: qty 2

## 2023-12-04 NOTE — ED Notes (Signed)
 Pt was given call light and educated on how to use it. Pt while holding call light kept saying Where is it. Pt informed he was holding it. Pt kept repeating I am not holding anything. While pt was grabbing call light. Pt then educated where the call light button was if he needed anything.

## 2023-12-04 NOTE — Plan of Care (Signed)
   Problem: Fluid Volume: Goal: Ability to maintain a balanced intake and output will improve Outcome: Progressing   Problem: Health Behavior/Discharge Planning: Goal: Ability to manage health-related needs will improve Outcome: Progressing   Problem: Metabolic: Goal: Ability to maintain appropriate glucose levels will improve Outcome: Progressing

## 2023-12-04 NOTE — Progress Notes (Signed)
 PROGRESS NOTE    Christopher Davila  AVW:[id number] DOB: [birth year] DOA: 12/03/2023 PCP: Hoover Luz, MD   Brief Narrative:  53 y.o. male with medical history significant of non-insulin -dependent DM type II, ESRD on HD MWF, grade 1 diastolic heart failure with preserved EF 60 to 65%, essential hypertension, chronic muscle fasciculation, reactive airway disease, presented to emergency department via EMS from nursing home with complaining of shortness of breath and altered mental status. S/p HD on 6/18. He was placed on bipap, now on HFNC at 8 L.    Assessment & Plan:  Principal Problem:   Volume overload Active Problems:   ESRD on dialysis (HCC)   Acute on chronic diastolic CHF (congestive heart failure) (HCC)   Essential hypertension   Acute metabolic encephalopathy   Reactive airway disease   Non-insulin  dependent type 2 diabetes mellitus (HCC)   Acute on chronic hypoxic respiratory failure,POA:  Essential hypertension ESRD, on HD MWF -Patient presented to ED via EMS with complaining of volume overload, shortness of breath need to garment of oxygen and missing dialysis today.  Also nursing home reported patient has intermittent confusion/lethargy. - At presentation to ED O2 sat dropped to 91% on 4.5 L oxygen.  Physical exam showing volume overload.  Patient was placed on BiPAP, now off. On HFNC at 8 L. Dependent on 2 L oxygen at baseline. to be done on discharge.  -Chest x-ray showed cardiomegaly and pulmonary vascular congestion Elevated BNP around 1388. -s/p HD Nephrology on board.  Acute on chronic diastolic heart failure exacerbation: NYHA III. ECHO done on 01/19/23 showed EF 60-65% with grade 1 diastolic dysfunction.  Hyperkalemia -Elevated potassium 6.1.  EKG no corresponding change with hyperkalemia.  Patient underwent dialysis tonight.  F/u BMP in am.   Acute metabolic encephalopathy -No acute abnormalities seen on CT Head.    History of reactive airway  disease -Off of BIPAP now. - Out patient follow up with pulmonology for PFTs. - Continue albuterol  as needed for any wheezing shortness of breath.   Non-insulin -dependent DM type II-diet controlled -A1c 5.9.  Continue carb modified diet with POC blood glucose check.     History of chronic generalized muscle fasciculation     DVT prophylaxis:  SCDs.    Code Status:  Full Code  Diet: Carb modified and renal diet  Disposition Plan: Continue to monitor improvement of volume status with dialysis. Consults: Nephrology   DVT prophylaxis: SCDs Start: 12/03/23 2120 Place TED hose Start: 12/03/23 2120     Code Status: Full Code Family Communication:   Status is: Inpatient Remains inpatient appropriate because: ARF,volume overload    Subjective:  Appearing drowsy but able to respond to questions appropriately.  Said that he is on oxygen at home at 2 L/min.   Examination:  General exam: drowsy but easily arousable Respiratory system: Clear to auscultation. Slight tachypnea Cardiovascular system: S1 & S2 heard, RRR. No JVD, murmurs, rubs, gallops or clicks. No pedal edema. Gastrointestinal system: Abdomen is nondistended, soft and nontender. No organomegaly or masses felt. Normal bowel sounds heard. Central nervous system:Drowsy but able to respond to questions appropriately. No focal neurological deficits. Extremities: Symmetric 5 x 5 power. Skin: No rashes, lesions or ulcers Psychiatry: Judgement and insight appear normal. Mood & affect appropriate.      Diet Orders (From admission, onward)     Start     Ordered   12/03/23 2129  Diet renal/carb modified with fluid restriction Diet-HS Snack? Nothing; Fluid restriction: 1200 mL  Fluid; Room service appropriate? Yes; Fluid consistency: Thin  Diet effective now       Question Answer Comment  Diet-HS Snack? Nothing   Fluid restriction: 1200 mL Fluid   Room service appropriate? Yes   Fluid consistency: Thin      12/03/23  2128            Objective: Vitals:   12/04/23 1100 12/04/23 1130 12/04/23 1215 12/04/23 1300  BP: (!) 142/90 122/82 129/86 (!) 148/97  Pulse: 90 83 83 95  Resp: 14 15 (!) 6 16  Temp:    98.5 F (36.9 C)  TempSrc:    Oral  SpO2: 97% 97% 97% 90%   No intake or output data in the 24 hours ending 12/04/23 1336 There were no vitals filed for this visit.  Scheduled Meds:  amLODipine   10 mg Oral Q T,Th,S,Su   [START ON 12/05/2023] amLODipine   5 mg Oral Q M,W,F   calcium  acetate  667 mg Oral TID WC   Chlorhexidine  Gluconate Cloth  6 each Topical Q0600   famotidine   20 mg Oral QHS   gabapentin   300 mg Oral Daily   hydrALAZINE   25 mg Oral TID   losartan   100 mg Oral Daily   metolazone   5 mg Oral QHS   sodium bicarbonate   50 mEq Intravenous Once   sodium chloride  flush  3 mL Intravenous Q12H   sodium chloride  flush  3 mL Intravenous Q12H   sodium zirconium cyclosilicate   10 g Oral Once   tamsulosin  0.4 mg Oral Daily   Continuous Infusions:  sodium chloride       Nutritional status     There is no height or weight on file to calculate BMI.  Data Reviewed:   CBC: Recent Labs  Lab 12/03/23 1711 12/03/23 1716 12/03/23 1717 12/04/23 0956  WBC 7.7  --   --  11.2*  NEUTROABS 5.6  --   --   --   HGB 11.9* 13.3 14.3 11.8*  HCT 38.0* 39.0 42.0 37.0*  MCV 104.7*  --   --  103.1*  PLT 114*  --   --  115*   Basic Metabolic Panel: Recent Labs  Lab 12/03/23 1711 12/03/23 1716 12/03/23 1717 12/04/23 0956  NA 136 133* 134* 134*  135  K 6.1* 7.0* 7.1* 4.3  4.4  CL 92*  --  96* 89*  90*  CO2 25  --   --  26  26  GLUCOSE 110*  --  107* 87  86  BUN 38*  --  53* 23*  23*  CREATININE 7.49*  --  7.30* 5.14*  5.04*  CALCIUM  10.3  --   --  9.7  9.7  PHOS  --   --   --  5.2*   GFR: CrCl cannot be calculated (Unknown ideal weight.). Liver Function Tests: Recent Labs  Lab 12/03/23 1711 12/04/23 0956  AST 16 19  ALT 10 13  ALKPHOS 68 68  BILITOT 1.3* 1.7*   PROT 8.2* 7.8  ALBUMIN  3.6 3.3*  3.3*   No results for input(s): LIPASE, AMYLASE in the last 168 hours. No results for input(s): AMMONIA in the last 168 hours. Coagulation Profile: No results for input(s): INR, PROTIME in the last 168 hours. Cardiac Enzymes: No results for input(s): CKTOTAL, CKMB, CKMBINDEX, TROPONINI in the last 168 hours. BNP (last 3 results) No results for input(s): PROBNP in the last 8760 hours. HbA1C: No results for input(s): HGBA1C in the last  72 hours. CBG: Recent Labs  Lab 12/03/23 1702 12/04/23 0951  GLUCAP 88 90   Lipid Profile: No results for input(s): CHOL, HDL, LDLCALC, TRIG, CHOLHDL, LDLDIRECT in the last 72 hours. Thyroid  Function Tests: No results for input(s): TSH, T4TOTAL, FREET4, T3FREE, THYROIDAB in the last 72 hours. Anemia Panel: No results for input(s): VITAMINB12, FOLATE, FERRITIN, TIBC, IRON, RETICCTPCT in the last 72 hours. Sepsis Labs: No results for input(s): PROCALCITON, LATICACIDVEN in the last 168 hours.  No results found for this or any previous visit (from the past 240 hours).       Radiology Studies: CT Head Wo Contrast Result Date: 12/04/2023 EXAM: CT HEAD WITHOUT CONTRAST 12/04/2023 02:58:19 AM TECHNIQUE: CT of the head was performed without the administration of intravenous contrast. Automated exposure control, iterative reconstruction, and/or weight based adjustment of the mA/kV was utilized to reduce the radiation dose to as low as reasonably achievable. COMPARISON: 01/19/2023 CLINICAL HISTORY: Mental status change, unknown cause. Chief complaints; Shortness of Breath; Altered Mental Status. FINDINGS: BRAIN AND VENTRICLES: There is no acute intracranial hemorrhage, mass effect or midline shift. No abnormal extra-axial fluid collection. The gray-white differentiation is maintained without an acute infarct. There is no hydrocephalus. Mild subcortical and  periventricular small vessel ischemic changes. ORBITS: Right phthisis bulbi. Layering hemorrhage in the left globe (image 28), unchanged. SINUSES: The visualized paranasal sinuses and mastoid air cells demonstrate no acute abnormality. SOFT TISSUES AND SKULL: No acute abnormality of the visualized skull or soft tissues. IMPRESSION: 1. No acute intracranial abnormality. Mild small vessel ischemic changes. 2. Right phthisis bulbi. Layering hemorrhage in the left globe, unchanged. Electronically signed by: Zadie Herter MD 12/04/2023 03:14 AM EDT RP Workstation: [id number]   DG Chest Portable 1 View Result Date: 12/03/2023 CLINICAL DATA:  sob EXAM: PORTABLE CHEST - 1 VIEW COMPARISON:  January 22, 2023 FINDINGS: Diffuse interstitial opacities throughout both lungs. Hazy perihilar airspace opacities within both mid and lower lung zones. Small to moderate layering right pleural effusion again noted. Trace left pleural effusion. No pneumothorax. Moderate cardiomegaly. No acute fracture or destructive lesion. IMPRESSION: Moderate cardiomegaly with findings of pulmonary edema. Small to moderate layering right and trace left pleural effusions again noted. Electronically Signed   By: Rance Burrows M.D.   On: 12/03/2023 18:03           LOS: 1 day   Time spent= 35 mins    Clancy Crimes, MD [facility]  If 7PM-7AM, please contact night-coverage  12/04/2023, 1:36 PM

## 2023-12-04 NOTE — ED Notes (Signed)
 Pt O2 placed on 5L [place], pt responded well. O2 sat above 90%

## 2023-12-04 NOTE — Progress Notes (Signed)
*  PRELIMINARY RESULTS* Echocardiogram 2D Echocardiogram has been performed.  Glenna Lango 12/04/2023, 4:04 PM

## 2023-12-04 NOTE — ED Notes (Signed)
 Pt kept repeating My pants are wet. Pt was assessed and pants were dry. Pt notified of this. Pt kept saying No. Take them off. Pt's pants and underwear were then taken off by pt. When this RN attempted to cover pt up with blankets, pt kept saying No. Them off too. They are wet too. Pt blanket's were assessed and pt made aware that they are not wet. Pt insisted they were wet. Pt blankets were then taken off and a new sheet was placed on pt.

## 2023-12-04 NOTE — Progress Notes (Addendum)
  CT head no acute intracranial abnormality. CT head revealed right phthisis bulbi. Layering hemorrhage in the left globe, unchanged. Overnight patient has been back and forth in between BiPAP/nasal cannula/high flow oxygen.  Currently on high for flow oxygen 8 L.  Elliett Guarisco, MD [facility] 12/04/2023, 6:43 AM

## 2023-12-04 NOTE — Progress Notes (Signed)
 Pt currently off bipap and on 8L HFNC ([place]), sats 98%. Pt appears to be resting and is in no respiratory distress at this time. Bipap at bedside if needed.

## 2023-12-04 NOTE — ED Notes (Addendum)
 This RN notified Dr.Sundil and RT that pt O2 sat was 85-86% on 6LNC. Pt is being placed on additional O2 by RT

## 2023-12-04 NOTE — Progress Notes (Signed)
 Heart Failure Navigator Progress Note  Assessed for Heart & Vascular TOC clinic readiness.  Patient does not meet criteria due to ESRD on hemodialysis.   Navigator will sign off at this time.   Rhae Hammock, BSN, Scientist, clinical (histocompatibility and immunogenetics) Only

## 2023-12-05 DIAGNOSIS — I5033 Acute on chronic diastolic (congestive) heart failure: Secondary | ICD-10-CM | POA: Diagnosis not present

## 2023-12-05 DIAGNOSIS — Z992 Dependence on renal dialysis: Secondary | ICD-10-CM | POA: Diagnosis not present

## 2023-12-05 DIAGNOSIS — N186 End stage renal disease: Secondary | ICD-10-CM | POA: Diagnosis not present

## 2023-12-05 LAB — CBC WITH DIFFERENTIAL/PLATELET
Abs Immature Granulocytes: 0.06 10*3/uL (ref 0.00–0.07)
Basophils Absolute: 0 10*3/uL (ref 0.0–0.1)
Basophils Relative: 0 %
Eosinophils Absolute: 0.1 10*3/uL (ref 0.0–0.5)
Eosinophils Relative: 1 %
HCT: 33.9 % — ABNORMAL LOW (ref 39.0–52.0)
Hemoglobin: 10.9 g/dL — ABNORMAL LOW (ref 13.0–17.0)
Immature Granulocytes: 1 %
Lymphocytes Relative: 13 %
Lymphs Abs: 1.3 10*3/uL (ref 0.7–4.0)
MCH: 32.8 pg (ref 26.0–34.0)
MCHC: 32.2 g/dL (ref 30.0–36.0)
MCV: 102.1 fL — ABNORMAL HIGH (ref 80.0–100.0)
Monocytes Absolute: 1.2 10*3/uL — ABNORMAL HIGH (ref 0.1–1.0)
Monocytes Relative: 12 %
Neutro Abs: 7.2 10*3/uL (ref 1.7–7.7)
Neutrophils Relative %: 73 %
Platelets: 115 10*3/uL — ABNORMAL LOW (ref 150–400)
RBC: 3.32 MIL/uL — ABNORMAL LOW (ref 4.22–5.81)
RDW: 15.8 % — ABNORMAL HIGH (ref 11.5–15.5)
WBC: 9.8 10*3/uL (ref 4.0–10.5)
nRBC: 0 % (ref 0.0–0.2)

## 2023-12-05 LAB — RENAL FUNCTION PANEL
Albumin: 3.2 g/dL — ABNORMAL LOW (ref 3.5–5.0)
Anion gap: 18 — ABNORMAL HIGH (ref 5–15)
BUN: 44 mg/dL — ABNORMAL HIGH (ref 6–20)
CO2: 23 mmol/L (ref 22–32)
Calcium: 9.7 mg/dL (ref 8.9–10.3)
Chloride: 91 mmol/L — ABNORMAL LOW (ref 98–111)
Creatinine, Ser: 7.04 mg/dL — ABNORMAL HIGH (ref 0.61–1.24)
GFR, Estimated: 9 mL/min — ABNORMAL LOW (ref >60)
Glucose, Bld: 135 mg/dL — ABNORMAL HIGH (ref 70–99)
Phosphorus: 6.3 mg/dL — ABNORMAL HIGH (ref 2.5–4.6)
Potassium: 4.9 mmol/L (ref 3.5–5.1)
Sodium: 132 mmol/L — ABNORMAL LOW (ref 135–145)

## 2023-12-05 LAB — BASIC METABOLIC PANEL WITH GFR
Anion gap: 16 — ABNORMAL HIGH (ref 5–15)
BUN: 36 mg/dL — ABNORMAL HIGH (ref 6–20)
CO2: 28 mmol/L (ref 22–32)
Calcium: 9.7 mg/dL (ref 8.9–10.3)
Chloride: 89 mmol/L — ABNORMAL LOW (ref 98–111)
Creatinine, Ser: 6.57 mg/dL — ABNORMAL HIGH (ref 0.61–1.24)
GFR, Estimated: 10 mL/min — ABNORMAL LOW (ref >60)
Glucose, Bld: 129 mg/dL — ABNORMAL HIGH (ref 70–99)
Potassium: 4.9 mmol/L (ref 3.5–5.1)
Sodium: 133 mmol/L — ABNORMAL LOW (ref 135–145)

## 2023-12-05 LAB — GLUCOSE, CAPILLARY
Glucose-Capillary: 146 mg/dL — ABNORMAL HIGH (ref 70–99)
Glucose-Capillary: 152 mg/dL — ABNORMAL HIGH (ref 70–99)
Glucose-Capillary: 103 mg/dL — ABNORMAL HIGH (ref 70–99)

## 2023-12-05 LAB — CBC
HCT: 35 % — ABNORMAL LOW (ref 39.0–52.0)
Hemoglobin: 11.3 g/dL — ABNORMAL LOW (ref 13.0–17.0)
MCH: 33 pg (ref 26.0–34.0)
MCHC: 32.3 g/dL (ref 30.0–36.0)
MCV: 102.3 fL — ABNORMAL HIGH (ref 80.0–100.0)
Platelets: 111 10*3/uL — ABNORMAL LOW (ref 150–400)
RBC: 3.42 MIL/uL — ABNORMAL LOW (ref 4.22–5.81)
RDW: 15.7 % — ABNORMAL HIGH (ref 11.5–15.5)
WBC: 9.1 10*3/uL (ref 4.0–10.5)
nRBC: 0 % (ref 0.0–0.2)

## 2023-12-05 LAB — HEPATITIS B SURFACE ANTIBODY, QUANTITATIVE: Hep B S AB Quant (Post): 207 m[IU]/mL

## 2023-12-05 MED ORDER — LIDOCAINE-PRILOCAINE 2.5-2.5 % EX CREA: 1.0000 | TOPICAL_CREAM | CUTANEOUS | Status: DC | PRN

## 2023-12-05 MED ORDER — HEPARIN SODIUM (PORCINE) 1000 UNIT/ML DIALYSIS
1000.0000 [IU] | INTRAMUSCULAR | Status: DC | PRN
Start: 2023-12-05 — End: 2023-12-05

## 2023-12-05 MED ORDER — ANTICOAGULANT SODIUM CITRATE 4% (200MG/5ML) IV SOLN: 5.0000 mL | Status: DC | PRN

## 2023-12-05 MED ORDER — ALTEPLASE 2 MG IJ SOLR: 2.0000 mg | Freq: Once | INTRAMUSCULAR | Status: DC | PRN

## 2023-12-05 MED ORDER — HEPARIN SODIUM (PORCINE) 1000 UNIT/ML IJ SOLN
3000.0000 [IU] | Freq: Once | INTRAMUSCULAR | Status: AC
  Administered 2023-12-05: 3000 [IU] via INTRAVENOUS

## 2023-12-05 MED ORDER — HEPARIN SODIUM (PORCINE) 1000 UNIT/ML DIALYSIS: 1000.0000 [IU] | INTRAMUSCULAR | Status: DC | PRN

## 2023-12-05 MED ORDER — LIDOCAINE HCL (PF) 1 % IJ SOLN: 5.0000 mL | INTRAMUSCULAR | Status: DC | PRN

## 2023-12-05 MED ORDER — PENTAFLUOROPROP-TETRAFLUOROETH EX AERO: 1.0000 | INHALATION_SPRAY | CUTANEOUS | Status: DC | PRN

## 2023-12-05 MED ORDER — HEPARIN SODIUM (PORCINE) 1000 UNIT/ML IJ SOLN
INTRAMUSCULAR | Status: AC
  Filled 2023-12-05: qty 3

## 2023-12-05 MED ORDER — CHLORHEXIDINE GLUCONATE CLOTH 2 % EX PADS
6.0000 | MEDICATED_PAD | Freq: Every day | CUTANEOUS | Status: AC
Start: 2023-12-05 — End: ?
  Administered 2023-12-06 – 2023-12-08 (×3): 6 via TOPICAL

## 2023-12-05 NOTE — Plan of Care (Signed)
  Problem: Education: Goal: Ability to demonstrate management of disease process will improve Outcome: Progressing Goal: Ability to verbalize understanding of medication therapies will improve Outcome: Progressing   

## 2023-12-05 NOTE — Progress Notes (Signed)
 PROGRESS NOTE    Christopher Davila  QIO:[id number] DOB: [birth year] DOA: 12/03/2023 PCP: Hoover Luz, MD   Brief Narrative:  53 y.o. male with medical history significant of non-insulin -dependent DM type II, ESRD on HD MWF, grade 1 diastolic heart failure with preserved EF 60 to 65%, essential hypertension, chronic muscle fasciculation, reactive airway disease, presented to emergency department via EMS from nursing home with complaining of shortness of breath and altered mental status. S/p HD on 6/18. He was placed on bipap, now on HFNC at 8 L.    Assessment & Plan:  Principal Problem:   Volume overload Active Problems:   ESRD on dialysis (HCC)   Acute on chronic diastolic CHF (congestive heart failure) (HCC)   Acute respiratory failure with hypoxia (HCC)   Essential hypertension   SOB (shortness of breath)   Acute metabolic encephalopathy   Reactive airway disease   Non-insulin  dependent type 2 diabetes mellitus (HCC)   Acute on chronic hypoxic respiratory failure,POA:  Essential hypertension ESRD, on HD MWF -Patient presented to ED via EMS with complaining of volume overload, shortness of breath need to garment of oxygen and missing dialysis today.  Also nursing home reported patient has intermittent confusion/lethargy. - At presentation to ED O2 sat dropped to 91% on 4.5 L oxygen.  Physical exam showing volume overload.  Patient was placed on BiPAP, now off. On HFNC at 8 L. Dependent on 3-4 L oxygen at baseline. to be done on discharge.  -Chest x-ray showed cardiomegaly and pulmonary vascular congestion Elevated BNP around 1388. -s/p HD Nephrology on board.  Acute on chronic diastolic heart failure exacerbation: NYHA III.  Severe pulmonary hypertension: Likely in the setting of CHF. ECHO done on 12/04/23 showed EF 65-70% with grade 1 diastolic dysfunction. Continue with HD  Hyperkalemia: Resolved now   Acute metabolic encephalopathy: Resolved -No acute  abnormalities seen on CT Head.    History of reactive airway disease -Off of BIPAP now. - Out patient follow up with pulmonology for PFTs. - Continue albuterol  as needed for any wheezing shortness of breath.   Non-insulin -dependent DM type II-diet controlled -A1c 5.9.  Continue carb modified diet with POC blood glucose check.     History of chronic generalized muscle fasciculation    DVT prophylaxis:  SCDs.  Heparin  with dialysis  Code Status:  Full Code  Diet: Carb modified and renal diet  Consults: Nephrology  Disposition: Back to SNF, likely in the next 24-48 hours.   DVT prophylaxis: SCDs Start: 12/03/23 2120 Place TED hose Start: 12/03/23 2120     Code Status: Full Code Family Communication:   Status is: Inpatient Remains inpatient appropriate because: ARF,volume overload    Subjective:  Appears more alert and awake now, nasal oxygen in place.Denies any active complaints.   Examination:  General exam: Alert and Awake. Respiratory system: Clear to auscultation. Slight tachypnea Cardiovascular system: S1 & S2 heard, RRR. No JVD, murmurs, rubs, gallops or clicks. No pedal edema. Gastrointestinal system: Abdomen is nondistended, soft and nontender. No organomegaly or masses felt. Normal bowel sounds heard. Central nervous system:Alert and awake. No focal neurological deficits. Extremities: Symmetric 5 x 5 power. Skin: No rashes, lesions or ulcers Psychiatry: Judgement and insight appear normal. Mood & affect appropriate.      Diet Orders (From admission, onward)     Start     Ordered   12/03/23 2129  Diet renal/carb modified with fluid restriction Diet-HS Snack? Nothing; Fluid restriction: 1200 mL Fluid; Room service  appropriate? Yes; Fluid consistency: Thin  Diet effective now       Question Answer Comment  Diet-HS Snack? Nothing   Fluid restriction: 1200 mL Fluid   Room service appropriate? Yes   Fluid consistency: Thin      12/03/23 2128             Objective: Vitals:   12/05/23 0001 12/05/23 0354 12/05/23 0717 12/05/23 1102  BP: 137/82 102/68 126/81 120/81  Pulse: 94  87 85  Resp: 13 16 16 15   Temp: 98.7 F (37.1 C) 97.7 F (36.5 C) 98.2 F (36.8 C) 98.2 F (36.8 C)  TempSrc: Oral Oral Oral Oral  SpO2: 100% 92% 100% 91%  Weight:  60.3 kg      Intake/Output Summary (Last 24 hours) at 12/05/2023 1252 Last data filed at 12/05/2023 0825 Gross per 24 hour  Intake 360 ml  Output --  Net 360 ml   Filed Weights   12/05/23 0354  Weight: 60.3 kg    Scheduled Meds:  amLODipine   10 mg Oral Q T,Th,S,Su   amLODipine   5 mg Oral Q M,W,F   calcium  acetate  667 mg Oral TID WC   Chlorhexidine  Gluconate Cloth  6 each Topical Q0600   Chlorhexidine  Gluconate Cloth  6 each Topical Q0600   famotidine   20 mg Oral QHS   gabapentin   300 mg Oral Daily   hydrALAZINE   25 mg Oral TID   losartan   100 mg Oral Daily   metolazone   5 mg Oral QHS   sodium bicarbonate   50 mEq Intravenous Once   sodium chloride  flush  3 mL Intravenous Q12H   sodium chloride  flush  3 mL Intravenous Q12H   sodium zirconium cyclosilicate   10 g Oral Once   tamsulosin  0.4 mg Oral Daily   Continuous Infusions:  anticoagulant sodium citrate       Nutritional status     Body mass index is 22.12 kg/m.  Data Reviewed:   CBC: Recent Labs  Lab 12/03/23 1711 12/03/23 1716 12/03/23 1717 12/04/23 0956 12/05/23 0227 12/05/23 1219  WBC 7.7  --   --  11.2* 9.8 9.1  NEUTROABS 5.6  --   --   --  7.2  --   HGB 11.9* 13.3 14.3 11.8* 10.9* 11.3*  HCT 38.0* 39.0 42.0 37.0* 33.9* 35.0*  MCV 104.7*  --   --  103.1* 102.1* 102.3*  PLT 114*  --   --  115* 115* 111*   Basic Metabolic Panel: Recent Labs  Lab 12/03/23 1711 12/03/23 1716 12/03/23 1717 12/04/23 0956 12/04/23 1342 12/05/23 0227  NA 136 133* 134* 134*  135 134* 133*  K 6.1* 7.0* 7.1* 4.3  4.4 4.7 4.9  CL 92*  --  96* 89*  90* 91* 89*  CO2 25  --   --  26  26 26 28   GLUCOSE 110*  --   107* 87  86 89 129*  BUN 38*  --  53* 23*  23* 26* 36*  CREATININE 7.49*  --  7.30* 5.14*  5.04* 5.54* 6.57*  CALCIUM  10.3  --   --  9.7  9.7 9.9 9.7  PHOS  --   --   --  5.2*  --   --    GFR: CrCl cannot be calculated (Unknown ideal weight.). Liver Function Tests: Recent Labs  Lab 12/03/23 1711 12/04/23 0956  AST 16 19  ALT 10 13  ALKPHOS 68 68  BILITOT 1.3* 1.7*  PROT 8.2*  7.8  ALBUMIN  3.6 3.3*  3.3*   No results for input(s): LIPASE, AMYLASE in the last 168 hours. Recent Labs  Lab 12/04/23 1342  AMMONIA 33   Coagulation Profile: No results for input(s): INR, PROTIME in the last 168 hours. Cardiac Enzymes: No results for input(s): CKTOTAL, CKMB, CKMBINDEX, TROPONINI in the last 168 hours. BNP (last 3 results) No results for input(s): PROBNP in the last 8760 hours. HbA1C: Recent Labs    12/04/23 1342  HGBA1C 6.5*   CBG: Recent Labs  Lab 12/04/23 0951 12/04/23 1521 12/04/23 2105 12/05/23 0550 12/05/23 1106  GLUCAP 90 100* 164* 146* 152*   Lipid Profile: No results for input(s): CHOL, HDL, LDLCALC, TRIG, CHOLHDL, LDLDIRECT in the last 72 hours. Thyroid  Function Tests: No results for input(s): TSH, T4TOTAL, FREET4, T3FREE, THYROIDAB in the last 72 hours. Anemia Panel: No results for input(s): VITAMINB12, FOLATE, FERRITIN, TIBC, IRON, RETICCTPCT in the last 72 hours. Sepsis Labs: No results for input(s): PROCALCITON, LATICACIDVEN in the last 168 hours.  No results found for this or any previous visit (from the past 240 hours).       Radiology Studies: ECHOCARDIOGRAM COMPLETE Result Date: 12/04/2023    ECHOCARDIOGRAM REPORT   Patient Name:   Christopher Davila Date of Exam: 12/04/2023 Medical Rec #:  [id number]            Height:       65.0 in Accession #:    [id number]           Weight:       118.8 lb Date of Birth:  03-31-1971            BSA:          1.585 m Patient Age:    52 years              BP:           145/92 mmHg Patient Gender: M                    HR:           92 bpm. Exam Location:  Inpatient Procedure: 2D Echo, Color Doppler and Cardiac Doppler (Both Spectral and Color            Flow Doppler were utilized during procedure). Indications:    CHF  History:        Patient has prior history of Echocardiogram examinations, most                 recent 01/19/2023. Risk Factors:Hypertension.  Sonographer:    Jeralene Mom Referring Phys: [id number] SUBRINA SUNDIL IMPRESSIONS  1. Left ventricular ejection fraction, by estimation, is 65 to 70%. The left ventricle has normal function. The left ventricle has no regional wall motion abnormalities. There is mild concentric left ventricular hypertrophy. Left ventricular diastolic parameters are consistent with Grade I diastolic dysfunction (impaired relaxation). There is the interventricular septum is flattened in systole and diastole, consistent with right ventricular pressure and volume overload.  2. Right ventricular systolic function is severely reduced. The right ventricular size is severely enlarged. There is severely elevated pulmonary artery systolic pressure. The estimated right ventricular systolic pressure is 101.9 mmHg.  3. Right atrial size was severely dilated.  4. The mitral valve is normal in structure. No evidence of mitral valve regurgitation. No evidence of mitral stenosis.  5. Tricuspid valve regurgitation is moderate.  6. The aortic valve is tricuspid. There is mild calcification of  the aortic valve. Aortic valve regurgitation is not visualized. No aortic stenosis is present.  7. The inferior vena cava is dilated in size with <50% respiratory variability, suggesting right atrial pressure of 15 mmHg. FINDINGS  Left Ventricle: Left ventricular ejection fraction, by estimation, is 65 to 70%. The left ventricle has normal function. The left ventricle has no regional wall motion abnormalities. The left ventricular internal cavity size was  normal in size. There is  mild concentric left ventricular hypertrophy. The interventricular septum is flattened in systole and diastole, consistent with right ventricular pressure and volume overload. Left ventricular diastolic parameters are consistent with Grade I diastolic dysfunction (impaired relaxation). Right Ventricle: The right ventricular size is severely enlarged. No increase in right ventricular wall thickness. Right ventricular systolic function is severely reduced. There is severely elevated pulmonary artery systolic pressure. The tricuspid regurgitant velocity is 4.66 m/s, and with an assumed right atrial pressure of 15 mmHg, the estimated right ventricular systolic pressure is 101.9 mmHg. Left Atrium: Left atrial size was normal in size. Right Atrium: Right atrial size was severely dilated. Pericardium: There is no evidence of pericardial effusion. Mitral Valve: The mitral valve is normal in structure. No evidence of mitral valve regurgitation. No evidence of mitral valve stenosis. Tricuspid Valve: The tricuspid valve is normal in structure. Tricuspid valve regurgitation is moderate . No evidence of tricuspid stenosis. Aortic Valve: The aortic valve is tricuspid. There is mild calcification of the aortic valve. Aortic valve regurgitation is not visualized. No aortic stenosis is present. Aortic valve mean gradient measures 4.0 mmHg. Aortic valve peak gradient measures 6.9 mmHg. Aortic valve area, by VTI measures 1.91 cm. Pulmonic Valve: The pulmonic valve was normal in structure. Pulmonic valve regurgitation is trivial. No evidence of pulmonic stenosis. Aorta: The aortic root is normal in size and structure. Venous: The inferior vena cava is dilated in size with less than 50% respiratory variability, suggesting right atrial pressure of 15 mmHg. IAS/Shunts: No atrial level shunt detected by color flow Doppler.  LEFT VENTRICLE PLAX 2D LVIDd:         3.30 cm   Diastology LVIDs:         2.10 cm   LV e'  medial:    5.22 cm/s LV PW:         1.10 cm   LV E/e' medial:  10.0 LV IVS:        1.10 cm   LV e' lateral:   6.96 cm/s LVOT diam:     1.90 cm   LV E/e' lateral: 7.5 LV SV:         36 LV SV Index:   23 LVOT Area:     2.84 cm  RIGHT VENTRICLE RV Basal diam:  5.90 cm RV Mid diam:    4.70 cm RV S prime:     11.30 cm/s TAPSE (M-mode): 0.9 cm LEFT ATRIUM             Index        RIGHT ATRIUM           Index LA Vol (A2C):   17.3 ml 10.91 ml/m  RA Area:     26.50 cm LA Vol (A4C):   19.8 ml 12.49 ml/m  RA Volume:   103.00 ml 64.96 ml/m LA Biplane Vol: 18.9 ml 11.92 ml/m  AORTIC VALVE AV Area (Vmax):    1.89 cm AV Area (Vmean):   1.78 cm AV Area (VTI):     1.91 cm AV  Vmax:           131.00 cm/s AV Vmean:          84.500 cm/s AV VTI:            0.187 m AV Peak Grad:      6.9 mmHg AV Mean Grad:      4.0 mmHg LVOT Vmax:         87.40 cm/s LVOT Vmean:        53.100 cm/s LVOT VTI:          0.126 m LVOT/AV VTI ratio: 0.67  AORTA Ao Root diam: 2.40 cm Ao Asc diam:  3.00 cm MITRAL VALVE               TRICUSPID VALVE MV Area (PHT): 5.02 cm    TR Peak grad:   86.9 mmHg MV Decel Time: 151 msec    TR Vmax:        466.00 cm/s MV E velocity: 52.30 cm/s MV A velocity: 76.70 cm/s  SHUNTS MV E/A ratio:  0.68        Systemic VTI:  0.13 m                            Systemic Diam: 1.90 cm Jules Oar MD Electronically signed by Jules Oar MD Signature Date/Time: 12/04/2023/4:36:20 PM    Final    CT Head Wo Contrast Result Date: 12/04/2023 EXAM: CT HEAD WITHOUT CONTRAST 12/04/2023 02:58:19 AM TECHNIQUE: CT of the head was performed without the administration of intravenous contrast. Automated exposure control, iterative reconstruction, and/or weight based adjustment of the mA/kV was utilized to reduce the radiation dose to as low as reasonably achievable. COMPARISON: 01/19/2023 CLINICAL HISTORY: Mental status change, unknown cause. Chief complaints; Shortness of Breath; Altered Mental Status. FINDINGS: BRAIN AND  VENTRICLES: There is no acute intracranial hemorrhage, mass effect or midline shift. No abnormal extra-axial fluid collection. The gray-white differentiation is maintained without an acute infarct. There is no hydrocephalus. Mild subcortical and periventricular small vessel ischemic changes. ORBITS: Right phthisis bulbi. Layering hemorrhage in the left globe (image 28), unchanged. SINUSES: The visualized paranasal sinuses and mastoid air cells demonstrate no acute abnormality. SOFT TISSUES AND SKULL: No acute abnormality of the visualized skull or soft tissues. IMPRESSION: 1. No acute intracranial abnormality. Mild small vessel ischemic changes. 2. Right phthisis bulbi. Layering hemorrhage in the left globe, unchanged. Electronically signed by: Zadie Herter MD 12/04/2023 03:14 AM EDT RP Workstation: [id number]   DG Chest Portable 1 View Result Date: 12/03/2023 CLINICAL DATA:  sob EXAM: PORTABLE CHEST - 1 VIEW COMPARISON:  January 22, 2023 FINDINGS: Diffuse interstitial opacities throughout both lungs. Hazy perihilar airspace opacities within both mid and lower lung zones. Small to moderate layering right pleural effusion again noted. Trace left pleural effusion. No pneumothorax. Moderate cardiomegaly. No acute fracture or destructive lesion. IMPRESSION: Moderate cardiomegaly with findings of pulmonary edema. Small to moderate layering right and trace left pleural effusions again noted. Electronically Signed   By: Rance Burrows M.D.   On: 12/03/2023 18:03      LOS: 2 days   Time spent= 41 mins  Clancy Crimes, MD [facility]  If 7PM-7AM, please contact night-coverage  12/05/2023, 12:52 PM

## 2023-12-05 NOTE — TOC CM/SW Note (Addendum)
 Transition of Care [facility]) - Inpatient Brief Assessment   Patient Details  Name: Christopher Davila MRN: [id number] Date of Birth: 1971-05-31  [facility] CM/SW Contact:    Jennett Model, RN Phone Number: 12/05/2023, 3:05 PM   Clinical Narrative: [facility], has PCP and insurance on file, he states he will begin therapy on Wed , has walker at the facility.  States will need ambulance transport at dc and family is support system, states gets medications from facility.  Pta self ambulatory with walker. Plan is to return to [facility]. CSW aware.  Patient also states that he was released from Hospice, they do not follow him any longer.   Transition of Care Asessment: Insurance and Status: Insurance coverage has been reviewed Patient has primary care physician: Yes Home environment has been reviewed: from [facility] LTC Prior level of function:: ambulatory with walker Prior/Current Home Services: No current home services Social Drivers of Health Review: SDOH reviewed no interventions necessary Readmission risk has been reviewed: Yes Transition of care needs: transition of care needs identified, TOC will continue to follow

## 2023-12-05 NOTE — Progress Notes (Signed)
 [facility] Progress Note   Assessment/ Plan:   # Hyperkalemia - resolved  # ESRD - HD wed night, HD today   # Altered mental status/encephalopathy - improving   # Acute on chronic respiratory failure - On BiPAP - Usually on 3-4 L oxygen at SNF per charting - Optimize volume with HD   # Hypertension - Optimize volume status with dialysis   # Metabolic bone disease - Check phos in AM  - Resume outpatient regimen when taking p.o.  Subjective:    Seen in room.  Still having some asterxis.  Will plan for HD today.   Objective:   BP 120/81 (BP Location: Left Arm)   Pulse 85   Temp 98.2 F (36.8 C) (Oral)   Resp 15   Wt 60.3 kg   SpO2 91%   BMI 22.12 kg/m   Physical Exam: OZH:YQMVH in bed CVS: RRR Resp: clear Abd: soft Ext: no LE edema Neuro: AAO x3 but still slight asterixis ACCESS: AVF  Labs: BMET Recent Labs  Lab 12/03/23 1711 12/03/23 1716 12/03/23 1717 12/04/23 0956 12/04/23 1342 12/05/23 0227  NA 136 133* 134* 134*  135 134* 133*  K 6.1* 7.0* 7.1* 4.3  4.4 4.7 4.9  CL 92*  --  96* 89*  90* 91* 89*  CO2 25  --   --  26  26 26 28   GLUCOSE 110*  --  107* 87  86 89 129*  BUN 38*  --  53* 23*  23* 26* 36*  CREATININE 7.49*  --  7.30* 5.14*  5.04* 5.54* 6.57*  CALCIUM  10.3  --   --  9.7  9.7 9.9 9.7  PHOS  --   --   --  5.2*  --   --    CBC Recent Labs  Lab 12/03/23 1711 12/03/23 1716 12/03/23 1717 12/04/23 0956 12/05/23 0227  WBC 7.7  --   --  11.2* 9.8  NEUTROABS 5.6  --   --   --  7.2  HGB 11.9* 13.3 14.3 11.8* 10.9*  HCT 38.0* 39.0 42.0 37.0* 33.9*  MCV 104.7*  --   --  103.1* 102.1*  PLT 114*  --   --  115* 115*      Medications:     amLODipine   10 mg Oral Q T,Th,S,Su   amLODipine   5 mg Oral Q M,W,F   calcium  acetate  667 mg Oral TID WC   Chlorhexidine  Gluconate Cloth  6 each Topical Q0600   Chlorhexidine  Gluconate Cloth  6 each Topical Q0600   famotidine   20 mg Oral QHS   gabapentin   300 mg Oral Daily    hydrALAZINE   25 mg Oral TID   losartan   100 mg Oral Daily   metolazone   5 mg Oral QHS   sodium bicarbonate   50 mEq Intravenous Once   sodium chloride  flush  3 mL Intravenous Q12H   sodium chloride  flush  3 mL Intravenous Q12H   sodium zirconium cyclosilicate   10 g Oral Once   tamsulosin  0.4 mg Oral Daily     Leandra Pro MD 12/05/2023, 11:31 AM

## 2023-12-05 NOTE — Progress Notes (Signed)
 Received patient in bed to unit.  Alert and oriented.  Informed consent signed and in chart.   TX duration:3hours45minutes  Patient tolerated well.  Transported back to the room  Alert, without acute distress.  Hand-off given to patient's nurse.   Access used: fistula Access issues: none  Total UF removed: 3000 mls Medication(s) given: none Post HD VS: 124/84 Post HD weight: unable to obtain    12/05/23 2007  Vitals  Temp 98.5 F (36.9 C)  BP 124/84  MAP (mmHg) 95  BP Location Left Arm  BP Method Automatic  Patient Position (if appropriate) Lying  Pulse Rate 93  ECG Heart Rate 93  Resp 12  Oxygen Therapy  SpO2 100 %  O2 Device Nasal Cannula  Patient Activity (if Appropriate) In bed  Pulse Oximetry Type Continuous  During Treatment Monitoring  Blood Flow Rate (mL/min) 0 mL/min  Arterial Pressure (mmHg) -65.05 mmHg  Venous Pressure (mmHg) 75.55 mmHg  TMP (mmHg) 16.16 mmHg  Ultrafiltration Rate (mL/min) 1047 mL/min  Dialysate Flow Rate (mL/min) 299 ml/min  Duration of HD Treatment -hour(s) 3.5 hour(s)  Cumulative Fluid Removed (mL) per Treatment  3000.18  Post Treatment  Dialyzer Clearance Lightly streaked  Hemodialysis Intake (mL) 0 mL  Liters Processed 84  Fluid Removed (mL) 3000 mL  Tolerated HD Treatment Yes  Post-Hemodialysis Comments HD tx completed as expected, tolerated well, pt is stable.  AVG/AVF Arterial Site Held (minutes) 10 minutes  AVG/AVF Venous Site Held (minutes) 10 minutes  Note  Patient Observations pt is in bed resting.  Fistula / Graft Right Upper arm Arteriovenous fistula  Placement Date/Time: 10/29/19 1319   Placed prior to admission: No  Orientation: Right  Access Location: Upper arm  Access Type: (c) Arteriovenous fistula  Site Condition No complications  Fistula / Graft Assessment Present;Thrill;Bruit  Status Deaccessed  Drainage Description None      [facility]

## 2023-12-06 DIAGNOSIS — E8770 Fluid overload, unspecified: Secondary | ICD-10-CM | POA: Diagnosis not present

## 2023-12-06 LAB — GLUCOSE, CAPILLARY
Glucose-Capillary: 150 mg/dL — ABNORMAL HIGH (ref 70–99)
Glucose-Capillary: 143 mg/dL — ABNORMAL HIGH (ref 70–99)
Glucose-Capillary: 188 mg/dL — ABNORMAL HIGH (ref 70–99)
Glucose-Capillary: 202 mg/dL — ABNORMAL HIGH (ref 70–99)

## 2023-12-06 NOTE — Plan of Care (Signed)
  Problem: Education: Goal: Ability to demonstrate management of disease process will improve 12/06/2023 1116 by Lynnette Cena CROME, RN Outcome: Progressing 12/06/2023 1110 by Lynnette Cena CROME, RN Outcome: Progressing Goal: Ability to verbalize understanding of medication therapies will improve Outcome: Progressing

## 2023-12-06 NOTE — Plan of Care (Signed)
  Problem: Education: Goal: Ability to demonstrate management of disease process will improve Outcome: Progressing Goal: Ability to verbalize understanding of medication therapies will improve Outcome: Progressing   

## 2023-12-06 NOTE — Progress Notes (Signed)
 [facility] Progress Note   Subjective:   Seen in room - resting soundly but able to wake. Still using O2, but he thinks things are improving. No CP or abd pain.  Objective Vitals:   12/06/23 0501 12/06/23 0832 12/06/23 1141 12/06/23 1335  BP: 107/70  128/84   Pulse: 93 85 90 85  Resp: 20   (!) 21  Temp: 97.7 F (36.5 C) 97.8 F (36.6 C) 97.9 F (36.6 C)   TempSrc: Oral Oral Oral   SpO2: 99% 100% 100% 98%  Weight: 58.5 kg      Physical Exam General: Chronically ill man, NAD. Nasal O2 in place Heart: RRR; no murmur Lungs: CTA anteriorly Abdomen: soft Extremities: no LE edema Dialysis Access: AVF +t/b  Additional Objective Labs: Basic Metabolic Panel: Recent Labs  Lab 12/04/23 0956 12/04/23 1342 12/05/23 0227 12/05/23 1219  NA 134*  135 134* 133* 132*  K 4.3  4.4 4.7 4.9 4.9  CL 89*  90* 91* 89* 91*  CO2 26  26 26 28 23   GLUCOSE 87  86 89 129* 135*  BUN 23*  23* 26* 36* 44*  CREATININE 5.14*  5.04* 5.54* 6.57* 7.04*  CALCIUM  9.7  9.7 9.9 9.7 9.7  PHOS 5.2*  --   --  6.3*   Liver Function Tests: Recent Labs  Lab 12/03/23 1711 12/04/23 0956 12/05/23 1219  AST 16 19  --   ALT 10 13  --   ALKPHOS 68 68  --   BILITOT 1.3* 1.7*  --   PROT 8.2* 7.8  --   ALBUMIN  3.6 3.3*  3.3* 3.2*   CBC: Recent Labs  Lab 12/03/23 1711 12/03/23 1716 12/04/23 0956 12/05/23 0227 12/05/23 1219  WBC 7.7  --  11.2* 9.8 9.1  NEUTROABS 5.6  --   --  7.2  --   HGB 11.9*   < > 11.8* 10.9* 11.3*  HCT 38.0*   < > 37.0* 33.9* 35.0*  MCV 104.7*  --  103.1* 102.1* 102.3*  PLT 114*  --  115* 115* 111*   < > = values in this interval not displayed.   Studies/Results: ECHOCARDIOGRAM COMPLETE Result Date: 12/04/2023    ECHOCARDIOGRAM REPORT   Patient Name:   JAMAR WEATHERALL Date of Exam: 12/04/2023 Medical Rec #:  [id number]            Height:       65.0 in Accession #:    [id number]           Weight:       118.8 lb Date of Birth:  [birth year]             BSA:          1.585 m Patient Age:    53 years             BP:           145/92 mmHg Patient Gender: M                    HR:           92 bpm. Exam Location:  Inpatient Procedure: 2D Echo, Color Doppler and Cardiac Doppler (Both Spectral and Color            Flow Doppler were utilized during procedure). Indications:    CHF  History:        Patient has prior history of Echocardiogram examinations, most  recent 01/19/2023. Risk Factors:Hypertension.  Sonographer:    Benard Stallion Referring Phys: [id number] SUBRINA SUNDIL IMPRESSIONS  1. Left ventricular ejection fraction, by estimation, is 65 to 70%. The left ventricle has normal function. The left ventricle has no regional wall motion abnormalities. There is mild concentric left ventricular hypertrophy. Left ventricular diastolic parameters are consistent with Grade I diastolic dysfunction (impaired relaxation). There is the interventricular septum is flattened in systole and diastole, consistent with right ventricular pressure and volume overload.  2. Right ventricular systolic function is severely reduced. The right ventricular size is severely enlarged. There is severely elevated pulmonary artery systolic pressure. The estimated right ventricular systolic pressure is 101.9 mmHg.  3. Right atrial size was severely dilated.  4. The mitral valve is normal in structure. No evidence of mitral valve regurgitation. No evidence of mitral stenosis.  5. Tricuspid valve regurgitation is moderate.  6. The aortic valve is tricuspid. There is mild calcification of the aortic valve. Aortic valve regurgitation is not visualized. No aortic stenosis is present.  7. The inferior vena cava is dilated in size with <50% respiratory variability, suggesting right atrial pressure of 15 mmHg. FINDINGS  Left Ventricle: Left ventricular ejection fraction, by estimation, is 65 to 70%. The left ventricle has normal function. The left ventricle has no regional wall motion  abnormalities. The left ventricular internal cavity size was normal in size. There is  mild concentric left ventricular hypertrophy. The interventricular septum is flattened in systole and diastole, consistent with right ventricular pressure and volume overload. Left ventricular diastolic parameters are consistent with Grade I diastolic dysfunction (impaired relaxation). Right Ventricle: The right ventricular size is severely enlarged. No increase in right ventricular wall thickness. Right ventricular systolic function is severely reduced. There is severely elevated pulmonary artery systolic pressure. The tricuspid regurgitant velocity is 4.66 m/s, and with an assumed right atrial pressure of 15 mmHg, the estimated right ventricular systolic pressure is 101.9 mmHg. Left Atrium: Left atrial size was normal in size. Right Atrium: Right atrial size was severely dilated. Pericardium: There is no evidence of pericardial effusion. Mitral Valve: The mitral valve is normal in structure. No evidence of mitral valve regurgitation. No evidence of mitral valve stenosis. Tricuspid Valve: The tricuspid valve is normal in structure. Tricuspid valve regurgitation is moderate . No evidence of tricuspid stenosis. Aortic Valve: The aortic valve is tricuspid. There is mild calcification of the aortic valve. Aortic valve regurgitation is not visualized. No aortic stenosis is present. Aortic valve mean gradient measures 4.0 mmHg. Aortic valve peak gradient measures 6.9 mmHg. Aortic valve area, by VTI measures 1.91 cm. Pulmonic Valve: The pulmonic valve was normal in structure. Pulmonic valve regurgitation is trivial. No evidence of pulmonic stenosis. Aorta: The aortic root is normal in size and structure. Venous: The inferior vena cava is dilated in size with less than 50% respiratory variability, suggesting right atrial pressure of 15 mmHg. IAS/Shunts: No atrial level shunt detected by color flow Doppler.  LEFT VENTRICLE PLAX 2D LVIDd:          3.30 cm   Diastology LVIDs:         2.10 cm   LV e' medial:    5.22 cm/s LV PW:         1.10 cm   LV E/e' medial:  10.0 LV IVS:        1.10 cm   LV e' lateral:   6.96 cm/s LVOT diam:     1.90 cm   LV E/e'  lateral: 7.5 LV SV:         36 LV SV Index:   23 LVOT Area:     2.84 cm  RIGHT VENTRICLE RV Basal diam:  5.90 cm RV Mid diam:    4.70 cm RV S prime:     11.30 cm/s TAPSE (M-mode): 0.9 cm LEFT ATRIUM             Index        RIGHT ATRIUM           Index LA Vol (A2C):   17.3 ml 10.91 ml/m  RA Area:     26.50 cm LA Vol (A4C):   19.8 ml 12.49 ml/m  RA Volume:   103.00 ml 64.96 ml/m LA Biplane Vol: 18.9 ml 11.92 ml/m  AORTIC VALVE AV Area (Vmax):    1.89 cm AV Area (Vmean):   1.78 cm AV Area (VTI):     1.91 cm AV Vmax:           131.00 cm/s AV Vmean:          84.500 cm/s AV VTI:            0.187 m AV Peak Grad:      6.9 mmHg AV Mean Grad:      4.0 mmHg LVOT Vmax:         87.40 cm/s LVOT Vmean:        53.100 cm/s LVOT VTI:          0.126 m LVOT/AV VTI ratio: 0.67  AORTA Ao Root diam: 2.40 cm Ao Asc diam:  3.00 cm MITRAL VALVE               TRICUSPID VALVE MV Area (PHT): 5.02 cm    TR Peak grad:   86.9 mmHg MV Decel Time: 151 msec    TR Vmax:        466.00 cm/s MV E velocity: 52.30 cm/s MV A velocity: 76.70 cm/s  SHUNTS MV E/A ratio:  0.68        Systemic VTI:  0.13 m                            Systemic Diam: 1.90 cm Toribio Fuel MD Electronically signed by Toribio Fuel MD Signature Date/Time: 12/04/2023/4:36:20 PM    Final    Medications:   amLODipine   10 mg Oral Q T,Th,S,Su   amLODipine   5 mg Oral Q M,W,F   calcium  acetate  667 mg Oral TID WC   Chlorhexidine  Gluconate Cloth  6 each Topical Q0600   Chlorhexidine  Gluconate Cloth  6 each Topical Q0600   famotidine   20 mg Oral QHS   gabapentin   300 mg Oral Daily   hydrALAZINE   25 mg Oral TID   losartan   100 mg Oral Daily   metolazone   5 mg Oral QHS   sodium bicarbonate   50 mEq Intravenous Once   sodium chloride  flush  3 mL Intravenous  Q12H   sodium chloride  flush  3 mL Intravenous Q12H   sodium zirconium cyclosilicate   10 g Oral Once   tamsulosin   0.4 mg Oral Daily    Dialysis Orders MWF - [facility] 4hr, 400/600, EDW 64kg, 2K/2Ca bath, AVF, heparin  69999 unit bolus - Hectoral 6mcg IV q HD - no ESA  Assessment/Plan: Hyperkalemia: Resolved with HD Acute on chronic RF: Started on BiPAP, now weaned to nasal O2. Continue to maximize UF with HD ESRD: Usual  MWF schedule - next HD 6/23 HTN/volume: BP stable, volume improving. Below EDW now - keep lowering. Anemia of ESRD: Hgb 11.3 - no ESA Secondary HPTH: Phos coming down - continue home Phoslo .   Izetta Boehringer, PA-C 12/06/2023, 3:58 PM  [facility]

## 2023-12-06 NOTE — Progress Notes (Signed)
 PROGRESS NOTE    Christopher Davila  FMW:[id number] DOB: Feb 23, 1971 DOA: 12/03/2023 PCP: Feliciano Devoria LABOR, MD   Brief Narrative:  53 y.o. male with medical history significant of non-insulin -dependent DM type II, ESRD on HD MWF, grade 1 diastolic heart failure with preserved EF 60 to 65%, essential hypertension, chronic muscle fasciculation, reactive airway disease, presented to emergency department via EMS from nursing home with complaining of shortness of breath and altered mental status. S/p HD on 6/18. He was placed on bipap, now on HFNC at 8 L.    Assessment & Plan:  Principal Problem:   Volume overload Active Problems:   ESRD on dialysis (HCC)   Acute on chronic diastolic CHF (congestive heart failure) (HCC)   Acute respiratory failure with hypoxia (HCC)   Essential hypertension   SOB (shortness of breath)   Acute metabolic encephalopathy   Reactive airway disease   Non-insulin  dependent type 2 diabetes mellitus (HCC)   Acute on chronic hypoxic respiratory failure,POA:  Essential hypertension ESRD, on HD MWF -Patient presented to ED via EMS with complaining of volume overload, shortness of breath need to garment of oxygen and missing dialysis today.  Also nursing home reported patient has intermittent confusion/lethargy. - At presentation to ED O2 sat dropped to 91% on 4.5 L oxygen.  Physical exam showing volume overload.  Patient was placed on BiPAP, now off. On 7 L nasal cannula now. Dependent on 3-4 L oxygen at baseline. to be done before discharge.  -Chest x-ray showed cardiomegaly and pulmonary vascular congestion Elevated BNP around 1388. -s/p HD Nephrology on board.  Acute on chronic diastolic heart failure exacerbation: NYHA III.  Severe pulmonary hypertension: Likely in the setting of CHF. ECHO done on 12/04/23 showed EF 65-70% with grade 1 diastolic dysfunction. Continue with HD  Hyperkalemia: Resolved now   Acute metabolic encephalopathy:  Resolved -No acute abnormalities seen on CT Head.    History of reactive airway disease -Off of BIPAP now. - Out patient follow up with pulmonology for PFTs. - Continue albuterol  as needed for any wheezing shortness of breath.   Non-insulin -dependent DM type II-diet controlled -A1c 5.9.  Continue carb modified diet with POC blood glucose check.     History of chronic generalized muscle fasciculation    DVT prophylaxis:  SCDs.  Heparin  with dialysis  Code Status:  Full Code  Diet: Carb modified and renal diet  Consults: Nephrology  Disposition: Back to [facility], likely in the next 24-48 hours.   DVT prophylaxis: SCDs Start: 12/03/23 2120 Place TED hose Start: 12/03/23 2120     Code Status: Full Code Family Communication:   Status is: Inpatient Remains inpatient appropriate because: ARF,volume overload    Subjective:  Laying in bed, no distress noted. Shortness of breath has resolved now.   Examination:  General exam: Alert and Awake.Nasal oxygen in place. Respiratory system: Clear to auscultation.  Cardiovascular system: S1 & S2 heard, RRR. No JVD, murmurs, rubs, gallops or clicks. No pedal edema. Gastrointestinal system: Abdomen is slightly distended, soft and nontender. No organomegaly or masses felt. Normal bowel sounds heard. Central nervous system:Alert and awake. No focal neurological deficits. Extremities: Symmetric 5 x 5 power. Skin: No rashes, lesions or ulcers Psychiatry: Judgement and insight appear normal. Mood & affect appropriate.      Diet Orders (From admission, onward)     Start     Ordered   12/03/23 2129  Diet renal/carb modified with fluid restriction Diet-HS Snack? Nothing; Fluid restriction:  1200 mL Fluid; Room service appropriate? Yes; Fluid consistency: Thin  Diet effective now       Question Answer Comment  Diet-HS Snack? Nothing   Fluid restriction: 1200 mL Fluid   Room service appropriate? Yes   Fluid consistency: Thin       12/03/23 2128            Objective: Vitals:   12/06/23 0050 12/06/23 0501 12/06/23 0832 12/06/23 1141  BP: 118/87 107/70  128/84  Pulse: 94 93 85 90  Resp: 15 20    Temp: 98.9 F (37.2 C) 97.7 F (36.5 C) 97.8 F (36.6 C) 97.9 F (36.6 C)  TempSrc: Oral Oral Oral Oral  SpO2: 95% 99% 100% 100%  Weight:  58.5 kg      Intake/Output Summary (Last 24 hours) at 12/06/2023 1332 Last data filed at 12/05/2023 2214 Gross per 24 hour  Intake 240 ml  Output 3000 ml  Net -2760 ml   Filed Weights   12/05/23 0354 12/05/23 1546 12/06/23 0501  Weight: 60.3 kg 62.4 kg 58.5 kg    Scheduled Meds:  amLODipine   10 mg Oral Q T,Th,S,Su   amLODipine   5 mg Oral Q M,W,F   calcium  acetate  667 mg Oral TID WC   Chlorhexidine  Gluconate Cloth  6 each Topical Q0600   Chlorhexidine  Gluconate Cloth  6 each Topical Q0600   famotidine   20 mg Oral QHS   gabapentin   300 mg Oral Daily   hydrALAZINE   25 mg Oral TID   losartan   100 mg Oral Daily   metolazone   5 mg Oral QHS   sodium bicarbonate   50 mEq Intravenous Once   sodium chloride  flush  3 mL Intravenous Q12H   sodium chloride  flush  3 mL Intravenous Q12H   sodium zirconium cyclosilicate   10 g Oral Once   tamsulosin   0.4 mg Oral Daily   Continuous Infusions:    Nutritional status     Body mass index is 21.46 kg/m.  Data Reviewed:   CBC: Recent Labs  Lab 12/03/23 1711 12/03/23 1716 12/03/23 1717 12/04/23 0956 12/05/23 0227 12/05/23 1219  WBC 7.7  --   --  11.2* 9.8 9.1  NEUTROABS 5.6  --   --   --  7.2  --   HGB 11.9* 13.3 14.3 11.8* 10.9* 11.3*  HCT 38.0* 39.0 42.0 37.0* 33.9* 35.0*  MCV 104.7*  --   --  103.1* 102.1* 102.3*  PLT 114*  --   --  115* 115* 111*   Basic Metabolic Panel: Recent Labs  Lab 12/03/23 1711 12/03/23 1716 12/03/23 1717 12/04/23 0956 12/04/23 1342 12/05/23 0227 12/05/23 1219  NA 136   < > 134* 134*  135 134* 133* 132*  K 6.1*   < > 7.1* 4.3  4.4 4.7 4.9 4.9  CL 92*  --  96* 89*   90* 91* 89* 91*  CO2 25  --   --  26  26 26 28 23   GLUCOSE 110*  --  107* 87  86 89 129* 135*  BUN 38*  --  53* 23*  23* 26* 36* 44*  CREATININE 7.49*  --  7.30* 5.14*  5.04* 5.54* 6.57* 7.04*  CALCIUM  10.3  --   --  9.7  9.7 9.9 9.7 9.7  PHOS  --   --   --  5.2*  --   --  6.3*   < > = values in this interval not displayed.   GFR: CrCl  cannot be calculated (Unknown ideal weight.). Liver Function Tests: Recent Labs  Lab 12/03/23 1711 12/04/23 0956 12/05/23 1219  AST 16 19  --   ALT 10 13  --   ALKPHOS 68 68  --   BILITOT 1.3* 1.7*  --   PROT 8.2* 7.8  --   ALBUMIN  3.6 3.3*  3.3* 3.2*   No results for input(s): LIPASE, AMYLASE in the last 168 hours. Recent Labs  Lab 12/04/23 1342  AMMONIA 33   Coagulation Profile: No results for input(s): INR, PROTIME in the last 168 hours. Cardiac Enzymes: No results for input(s): CKTOTAL, CKMB, CKMBINDEX, TROPONINI in the last 168 hours. BNP (last 3 results) No results for input(s): PROBNP in the last 8760 hours. HbA1C: Recent Labs    12/04/23 1342  HGBA1C 6.5*   CBG: Recent Labs  Lab 12/05/23 0550 12/05/23 1106 12/05/23 2121 12/06/23 0636 12/06/23 1139  GLUCAP 146* 152* 103* 150* 143*   Lipid Profile: No results for input(s): CHOL, HDL, LDLCALC, TRIG, CHOLHDL, LDLDIRECT in the last 72 hours. Thyroid  Function Tests: No results for input(s): TSH, T4TOTAL, FREET4, T3FREE, THYROIDAB in the last 72 hours. Anemia Panel: No results for input(s): VITAMINB12, FOLATE, FERRITIN, TIBC, IRON, RETICCTPCT in the last 72 hours. Sepsis Labs: No results for input(s): PROCALCITON, LATICACIDVEN in the last 168 hours.  No results found for this or any previous visit (from the past 240 hours).       Radiology Studies: ECHOCARDIOGRAM COMPLETE Result Date: 12/04/2023    ECHOCARDIOGRAM REPORT   Patient Name:   ROSA GAMBALE Date of Exam: 12/04/2023 Medical Rec #:   [id number]            Height:       65.0 in Accession #:    [id number]           Weight:       118.8 lb Date of Birth:  1971/01/11            BSA:          1.585 m Patient Age:    52 years             BP:           145/92 mmHg Patient Gender: M                    HR:           92 bpm. Exam Location:  Inpatient Procedure: 2D Echo, Color Doppler and Cardiac Doppler (Both Spectral and Color            Flow Doppler were utilized during procedure). Indications:    CHF  History:        Patient has prior history of Echocardiogram examinations, most                 recent 01/19/2023. Risk Factors:Hypertension.  Sonographer:    Benard Stallion Referring Phys: [id number] SUBRINA SUNDIL IMPRESSIONS  1. Left ventricular ejection fraction, by estimation, is 65 to 70%. The left ventricle has normal function. The left ventricle has no regional wall motion abnormalities. There is mild concentric left ventricular hypertrophy. Left ventricular diastolic parameters are consistent with Grade I diastolic dysfunction (impaired relaxation). There is the interventricular septum is flattened in systole and diastole, consistent with right ventricular pressure and volume overload.  2. Right ventricular systolic function is severely reduced. The right ventricular size is severely enlarged. There is severely elevated pulmonary artery systolic pressure. The estimated  right ventricular systolic pressure is 101.9 mmHg.  3. Right atrial size was severely dilated.  4. The mitral valve is normal in structure. No evidence of mitral valve regurgitation. No evidence of mitral stenosis.  5. Tricuspid valve regurgitation is moderate.  6. The aortic valve is tricuspid. There is mild calcification of the aortic valve. Aortic valve regurgitation is not visualized. No aortic stenosis is present.  7. The inferior vena cava is dilated in size with <50% respiratory variability, suggesting right atrial pressure of 15 mmHg. FINDINGS  Left Ventricle: Left ventricular  ejection fraction, by estimation, is 65 to 70%. The left ventricle has normal function. The left ventricle has no regional wall motion abnormalities. The left ventricular internal cavity size was normal in size. There is  mild concentric left ventricular hypertrophy. The interventricular septum is flattened in systole and diastole, consistent with right ventricular pressure and volume overload. Left ventricular diastolic parameters are consistent with Grade I diastolic dysfunction (impaired relaxation). Right Ventricle: The right ventricular size is severely enlarged. No increase in right ventricular wall thickness. Right ventricular systolic function is severely reduced. There is severely elevated pulmonary artery systolic pressure. The tricuspid regurgitant velocity is 4.66 m/s, and with an assumed right atrial pressure of 15 mmHg, the estimated right ventricular systolic pressure is 101.9 mmHg. Left Atrium: Left atrial size was normal in size. Right Atrium: Right atrial size was severely dilated. Pericardium: There is no evidence of pericardial effusion. Mitral Valve: The mitral valve is normal in structure. No evidence of mitral valve regurgitation. No evidence of mitral valve stenosis. Tricuspid Valve: The tricuspid valve is normal in structure. Tricuspid valve regurgitation is moderate . No evidence of tricuspid stenosis. Aortic Valve: The aortic valve is tricuspid. There is mild calcification of the aortic valve. Aortic valve regurgitation is not visualized. No aortic stenosis is present. Aortic valve mean gradient measures 4.0 mmHg. Aortic valve peak gradient measures 6.9 mmHg. Aortic valve area, by VTI measures 1.91 cm. Pulmonic Valve: The pulmonic valve was normal in structure. Pulmonic valve regurgitation is trivial. No evidence of pulmonic stenosis. Aorta: The aortic root is normal in size and structure. Venous: The inferior vena cava is dilated in size with less than 50% respiratory variability,  suggesting right atrial pressure of 15 mmHg. IAS/Shunts: No atrial level shunt detected by color flow Doppler.  LEFT VENTRICLE PLAX 2D LVIDd:         3.30 cm   Diastology LVIDs:         2.10 cm   LV e' medial:    5.22 cm/s LV PW:         1.10 cm   LV E/e' medial:  10.0 LV IVS:        1.10 cm   LV e' lateral:   6.96 cm/s LVOT diam:     1.90 cm   LV E/e' lateral: 7.5 LV SV:         36 LV SV Index:   23 LVOT Area:     2.84 cm  RIGHT VENTRICLE RV Basal diam:  5.90 cm RV Mid diam:    4.70 cm RV S prime:     11.30 cm/s TAPSE (M-mode): 0.9 cm LEFT ATRIUM             Index        RIGHT ATRIUM           Index LA Vol (A2C):   17.3 ml 10.91 ml/m  RA Area:     26.50  cm LA Vol (A4C):   19.8 ml 12.49 ml/m  RA Volume:   103.00 ml 64.96 ml/m LA Biplane Vol: 18.9 ml 11.92 ml/m  AORTIC VALVE AV Area (Vmax):    1.89 cm AV Area (Vmean):   1.78 cm AV Area (VTI):     1.91 cm AV Vmax:           131.00 cm/s AV Vmean:          84.500 cm/s AV VTI:            0.187 m AV Peak Grad:      6.9 mmHg AV Mean Grad:      4.0 mmHg LVOT Vmax:         87.40 cm/s LVOT Vmean:        53.100 cm/s LVOT VTI:          0.126 m LVOT/AV VTI ratio: 0.67  AORTA Ao Root diam: 2.40 cm Ao Asc diam:  3.00 cm MITRAL VALVE               TRICUSPID VALVE MV Area (PHT): 5.02 cm    TR Peak grad:   86.9 mmHg MV Decel Time: 151 msec    TR Vmax:        466.00 cm/s MV E velocity: 52.30 cm/s MV A velocity: 76.70 cm/s  SHUNTS MV E/A ratio:  0.68        Systemic VTI:  0.13 m                            Systemic Diam: 1.90 cm Toribio Fuel MD Electronically signed by Toribio Fuel MD Signature Date/Time: 12/04/2023/4:36:20 PM    Final       LOS: 3 days   Time spent= 41 mins  Deliliah Room, MD [facility]  If 7PM-7AM, please contact night-coverage  12/06/2023, 1:32 PM

## 2023-12-07 DIAGNOSIS — I5033 Acute on chronic diastolic (congestive) heart failure: Secondary | ICD-10-CM | POA: Diagnosis not present

## 2023-12-07 LAB — GLUCOSE, CAPILLARY
Glucose-Capillary: 180 mg/dL — ABNORMAL HIGH (ref 70–99)
Glucose-Capillary: 155 mg/dL — ABNORMAL HIGH (ref 70–99)
Glucose-Capillary: 189 mg/dL — ABNORMAL HIGH (ref 70–99)

## 2023-12-07 NOTE — Plan of Care (Signed)
  Problem: Education: Goal: Ability to demonstrate management of disease process will improve 12/07/2023 1916 by Lynnette Cena CROME, RN Outcome: Progressing 12/07/2023 1916 by Lynnette Cena CROME, RN Outcome: Progressing

## 2023-12-07 NOTE — Progress Notes (Signed)
 Mobility Specialist Progress Note:   12/07/23 1450  Mobility  Activity Ambulated with assistance in hallway  Level of Assistance Minimal assist, patient does 75% or more  Assistive Device Front wheel walker  Distance Ambulated (ft) 80 ft  Activity Response Tolerated well  Mobility Referral Yes  Mobility visit 1 Mobility  Mobility Specialist Start Time (ACUTE ONLY) 1430  Mobility Specialist Stop Time (ACUTE ONLY) 1449  Mobility Specialist Time Calculation (min) (ACUTE ONLY) 19 min   Pt received seated EOB agreeable to mobility. Pt required VC and MinA w/ RW management throughout session. O2 flow set to 6L/min during ambulation to keep SPO2 above 88%. Returned to room w/o fault. Left seated EOB w/ call bell and personal belongings in reach. All needs met. RN aware.  Pre Mobility Spo2 RA  92% During Mobility Spo2 4L/min 85%                          Spo2 6L/min Spo2 88%-92% Post Mobility Spo2 4L/min 100%  Thersia Minder Mobility Specialist  Please contact vis Secure Chat or  Rehab Office [phone]

## 2023-12-07 NOTE — Plan of Care (Signed)
?  Problem: Education: ?Goal: Ability to demonstrate management of disease process will improve ?Outcome: Progressing ?  ?

## 2023-12-07 NOTE — Progress Notes (Signed)
 Mobility Specialist Progress Note:  Nurse requested Mobility Specialist to perform oxygen saturation test with pt which includes removing pt from oxygen both at rest and while ambulating.  Below are the results from that testing.     Patient Saturations on Room Air at Rest = spO2 92%  Patient Saturations on Room Air while Ambulating = sp02 84% .  Rested and performed pursed lip breathing for 1 minute with sp02 at 85%.  Patient Saturations on 6 Liters of oxygen while Ambulating = sp02 88%-92%  At end of testing pt left in room on 4  Liters of oxygen.  Reported results to nurse.    Thersia Minder Mobility Specialist  Please contact vis Secure Chat or  Rehab Office [phone]

## 2023-12-07 NOTE — Progress Notes (Signed)
   12/07/23 2033  BiPAP/CPAP/SIPAP  BiPAP/CPAP/SIPAP Pt Type Adult  BiPAP/CPAP/SIPAP Resmed  Reason BIPAP/CPAP not in use Non-compliant (pt stated he doesnt wear CPAP)

## 2023-12-07 NOTE — Progress Notes (Signed)
  [facility] Progress Note   Subjective:   NAD, looks good.    Objective Vitals:   12/07/23 0015 12/07/23 0401 12/07/23 0500 12/07/23 0749  BP: 106/84 124/83  (!) 144/88  Pulse: 76 87  94  Resp: 14 20  20   Temp: (!) 97.5 F (36.4 C) 97.8 F (36.6 C)  98.8 F (37.1 C)  TempSrc: Oral Oral  Oral  SpO2: 90% 91%  95%  Weight:   60.1 kg    Physical Exam General: Chronically ill man, NAD. Nasal O2 in place Heart: RRR; no murmur Lungs: CTA anteriorly Abdomen: soft Extremities: no LE edema Dialysis Access: AVF +t/b  Additional Objective Labs: Basic Metabolic Panel: Recent Labs  Lab 12/04/23 0956 12/04/23 1342 12/05/23 0227 12/05/23 1219  NA 134*  135 134* 133* 132*  K 4.3  4.4 4.7 4.9 4.9  CL 89*  90* 91* 89* 91*  CO2 26  26 26 28 23   GLUCOSE 87  86 89 129* 135*  BUN 23*  23* 26* 36* 44*  CREATININE 5.14*  5.04* 5.54* 6.57* 7.04*  CALCIUM  9.7  9.7 9.9 9.7 9.7  PHOS 5.2*  --   --  6.3*   Liver Function Tests: Recent Labs  Lab 12/03/23 1711 12/04/23 0956 12/05/23 1219  AST 16 19  --   ALT 10 13  --   ALKPHOS 68 68  --   BILITOT 1.3* 1.7*  --   PROT 8.2* 7.8  --   ALBUMIN  3.6 3.3*  3.3* 3.2*   CBC: Recent Labs  Lab 12/03/23 1711 12/03/23 1716 12/04/23 0956 12/05/23 0227 12/05/23 1219  WBC 7.7  --  11.2* 9.8 9.1  NEUTROABS 5.6  --   --  7.2  --   HGB 11.9*   < > 11.8* 10.9* 11.3*  HCT 38.0*   < > 37.0* 33.9* 35.0*  MCV 104.7*  --  103.1* 102.1* 102.3*  PLT 114*  --  115* 115* 111*   < > = values in this interval not displayed.   Studies/Results: No results found.  Medications:   amLODipine   10 mg Oral Q T,Th,S,Su   amLODipine   5 mg Oral Q M,W,F   calcium  acetate  667 mg Oral TID WC   Chlorhexidine  Gluconate Cloth  6 each Topical Q0600   Chlorhexidine  Gluconate Cloth  6 each Topical Q0600   famotidine   20 mg Oral QHS   gabapentin   300 mg Oral Daily   hydrALAZINE   25 mg Oral TID   losartan   100 mg Oral Daily    metolazone   5 mg Oral QHS   sodium chloride  flush  3 mL Intravenous Q12H   sodium chloride  flush  3 mL Intravenous Q12H   tamsulosin   0.4 mg Oral Daily    Dialysis Orders MWF - [facility] 4hr, 400/600, EDW 64kg, 2K/2Ca bath, AVF, heparin  3000 unit bolus - Hectoral 6mcg IV q HD - no ESA  Assessment/Plan: Hyperkalemia: Resolved with HD Acute on chronic RF: Started on BiPAP, now weaned to nasal O2. Continue to maximize UF with HD ESRD: Usual MWF schedule - next HD 6/23 HTN/volume: BP stable, volume improving. Below EDW now - keep lowering. Anemia of ESRD: Hgb 11.3 - no ESA Secondary HPTH: Phos coming down - continue home Phoslo . Dispo: could go from renal perspective to resume OP dialysis   Almarie Bonine MD 12/07/2023, 10:40 AM   [facility]

## 2023-12-07 NOTE — Progress Notes (Signed)
 PROGRESS NOTE    Christopher Davila  FMW:[id number] DOB: Oct 29, 1970 DOA: 12/03/2023 PCP: Feliciano Devoria LABOR, MD   Brief Narrative:  53 y.o. male with medical history significant of non-insulin -dependent DM type II, ESRD on HD MWF, grade 1 diastolic heart failure with preserved EF 60 to 65%, essential hypertension, chronic muscle fasciculation, reactive airway disease, presented to emergency department via EMS from nursing home with complaining of shortness of breath and altered mental status. S/p HD on 6/18. He was placed on bipap, now on 4 L [place] (2-3 L at baseline).    Assessment & Plan:  Principal Problem:   Volume overload Active Problems:   ESRD on dialysis (HCC)   Acute on chronic diastolic CHF (congestive heart failure) (HCC)   Acute respiratory failure with hypoxia (HCC)   Essential hypertension   SOB (shortness of breath)   Acute metabolic encephalopathy   Reactive airway disease   Non-insulin  dependent type 2 diabetes mellitus (HCC)   Acute on chronic hypoxic respiratory failure,POA: Improving now.  Essential hypertension ESRD, on HD MWF -Patient presented to ED via EMS with complaining of volume overload, shortness of breath need to garment of oxygen and missing dialysis today.  Also nursing home reported patient has intermittent confusion/lethargy. - At presentation to ED O2 sat dropped to 91% on 4.5 L oxygen.  Physical exam showing volume overload.  Patient was placed on BiPAP, now off.  On 4 L nasal cannula now. Dependent on 2-3 L oxygen at baseline. to be done today.  -Chest x-ray showed cardiomegaly and pulmonary vascular congestion Elevated BNP around 1388. -Last HD on 6/20. Next HD will be done on 6/23. Nephrology on board.  Acute on chronic diastolic heart failure exacerbation: NYHA III. Improving Severe pulmonary hypertension: Likely in the setting of CHF (Grade 2). ECHO done on 12/04/23 showed EF 65-70% with grade 1 diastolic dysfunction. Continue with  HD  Hyperkalemia: Resolved now   Acute metabolic encephalopathy: Resolved -No acute abnormalities seen on CT Head.  Hyponatremia: Likely hypervolemic. Continue with HD. Nephro on board.    History of reactive airway disease -Off of BIPAP now. - Out patient follow up with pulmonology for PFTs. - Continue albuterol  as needed for any wheezing or shortness of breath.   Non-insulin -dependent DM type II-diet controlled -A1c 5.9.  Continue carb modified diet with POC blood glucose check.     History of chronic generalized muscle fasciculation  DVT prophylaxis:  SCDs.  Heparin  with dialysis  Code Status:  Full Code  Diet: Carb modified and renal diet  Consults: Nephrology  Disposition: Back to [facility], likely in the next 24 hours.   DVT prophylaxis: SCDs Start: 12/03/23 2120 Place TED hose Start: 12/03/23 2120     Code Status: Full Code Family Communication:   Status is: Inpatient Remains inpatient appropriate because: ARF,volume overload    Subjective:  Shortness of breath has improved. No chest pain. He is on 4 L [place] now   Examination:  General exam: Alert and Awake. Nasal oxygen in place. Blind at baseline. Respiratory system: Clear to auscultation.  Cardiovascular system: S1 & S2 heard, RRR. No JVD, murmurs, rubs, gallops or clicks. No pedal edema. Gastrointestinal system: Abdomen is slightly distended, soft and nontender. No organomegaly or masses felt. Normal bowel sounds heard. Central nervous system:Alert and awake. No focal neurological deficits. Extremities: Symmetric 5 x 5 power. Skin: No rashes, lesions or ulcers Psychiatry: Judgement and insight appear normal. Mood & affect appropriate.  Diet Orders (From admission, onward)     Start     Ordered   12/03/23 2129  Diet renal/carb modified with fluid restriction Diet-HS Snack? Nothing; Fluid restriction: 1200 mL Fluid; Room service appropriate? Yes; Fluid consistency: Thin  Diet effective  now       Question Answer Comment  Diet-HS Snack? Nothing   Fluid restriction: 1200 mL Fluid   Room service appropriate? Yes   Fluid consistency: Thin      12/03/23 2128            Objective: Vitals:   12/07/23 0015 12/07/23 0401 12/07/23 0500 12/07/23 0749  BP: 106/84 124/83  (!) 144/88  Pulse: 76 87  94  Resp: 14 20  20   Temp: (!) 97.5 F (36.4 C) 97.8 F (36.6 C)  98.8 F (37.1 C)  TempSrc: Oral Oral  Oral  SpO2: 90% 91%  95%  Weight:   60.1 kg     Intake/Output Summary (Last 24 hours) at 12/07/2023 1022 Last data filed at 12/07/2023 0846 Gross per 24 hour  Intake 440 ml  Output 50 ml  Net 390 ml   Filed Weights   12/05/23 1546 12/06/23 0501 12/07/23 0500  Weight: 62.4 kg 58.5 kg 60.1 kg    Scheduled Meds:  amLODipine   10 mg Oral Q T,Th,S,Su   amLODipine   5 mg Oral Q M,W,F   calcium  acetate  667 mg Oral TID WC   Chlorhexidine  Gluconate Cloth  6 each Topical Q0600   Chlorhexidine  Gluconate Cloth  6 each Topical Q0600   famotidine   20 mg Oral QHS   gabapentin   300 mg Oral Daily   hydrALAZINE   25 mg Oral TID   losartan   100 mg Oral Daily   metolazone   5 mg Oral QHS   sodium chloride  flush  3 mL Intravenous Q12H   sodium chloride  flush  3 mL Intravenous Q12H   tamsulosin   0.4 mg Oral Daily   Continuous Infusions:    Nutritional status     Body mass index is 22.05 kg/m.  Data Reviewed:   CBC: Recent Labs  Lab 12/03/23 1711 12/03/23 1716 12/03/23 1717 12/04/23 0956 12/05/23 0227 12/05/23 1219  WBC 7.7  --   --  11.2* 9.8 9.1  NEUTROABS 5.6  --   --   --  7.2  --   HGB 11.9* 13.3 14.3 11.8* 10.9* 11.3*  HCT 38.0* 39.0 42.0 37.0* 33.9* 35.0*  MCV 104.7*  --   --  103.1* 102.1* 102.3*  PLT 114*  --   --  115* 115* 111*   Basic Metabolic Panel: Recent Labs  Lab 12/03/23 1711 12/03/23 1716 12/03/23 1717 12/04/23 0956 12/04/23 1342 12/05/23 0227 12/05/23 1219  NA 136   < > 134* 134*  135 134* 133* 132*  K 6.1*   < > 7.1* 4.3  4.4  4.7 4.9 4.9  CL 92*  --  96* 89*  90* 91* 89* 91*  CO2 25  --   --  26  26 26 28 23   GLUCOSE 110*  --  107* 87  86 89 129* 135*  BUN 38*  --  53* 23*  23* 26* 36* 44*  CREATININE 7.49*  --  7.30* 5.14*  5.04* 5.54* 6.57* 7.04*  CALCIUM  10.3  --   --  9.7  9.7 9.9 9.7 9.7  PHOS  --   --   --  5.2*  --   --  6.3*   < > =  values in this interval not displayed.   GFR: CrCl cannot be calculated (Unknown ideal weight.). Liver Function Tests: Recent Labs  Lab 12/03/23 1711 12/04/23 0956 12/05/23 1219  AST 16 19  --   ALT 10 13  --   ALKPHOS 68 68  --   BILITOT 1.3* 1.7*  --   PROT 8.2* 7.8  --   ALBUMIN  3.6 3.3*  3.3* 3.2*   No results for input(s): LIPASE, AMYLASE in the last 168 hours. Recent Labs  Lab 12/04/23 1342  AMMONIA 33   Coagulation Profile: No results for input(s): INR, PROTIME in the last 168 hours. Cardiac Enzymes: No results for input(s): CKTOTAL, CKMB, CKMBINDEX, TROPONINI in the last 168 hours. BNP (last 3 results) No results for input(s): PROBNP in the last 8760 hours. HbA1C: Recent Labs    12/04/23 1342  HGBA1C 6.5*   CBG: Recent Labs  Lab 12/06/23 0636 12/06/23 1139 12/06/23 1630 12/06/23 2107 12/07/23 0636  GLUCAP 150* 143* 188* 202* 180*   Lipid Profile: No results for input(s): CHOL, HDL, LDLCALC, TRIG, CHOLHDL, LDLDIRECT in the last 72 hours. Thyroid  Function Tests: No results for input(s): TSH, T4TOTAL, FREET4, T3FREE, THYROIDAB in the last 72 hours. Anemia Panel: No results for input(s): VITAMINB12, FOLATE, FERRITIN, TIBC, IRON, RETICCTPCT in the last 72 hours. Sepsis Labs: No results for input(s): PROCALCITON, LATICACIDVEN in the last 168 hours.  No results found for this or any previous visit (from the past 240 hours).    Radiology Studies: No results found.   LOS: 4 days   Time spent= 41 mins  Deliliah Room, MD [facility]  If 7PM-7AM, please contact  night-coverage  12/07/2023, 10:22 AM

## 2023-12-08 ENCOUNTER — Other Ambulatory Visit: Payer: Self-pay

## 2023-12-08 DIAGNOSIS — E8770 Fluid overload, unspecified: Secondary | ICD-10-CM | POA: Diagnosis not present

## 2023-12-08 LAB — GLUCOSE, CAPILLARY
Glucose-Capillary: 168 mg/dL — ABNORMAL HIGH (ref 70–99)
Glucose-Capillary: 173 mg/dL — ABNORMAL HIGH (ref 70–99)
Glucose-Capillary: 136 mg/dL — ABNORMAL HIGH (ref 70–99)
Glucose-Capillary: 210 mg/dL — ABNORMAL HIGH (ref 70–99)

## 2023-12-08 MED ORDER — GUAIFENESIN ER 600 MG PO TB12
600.0000 mg | ORAL_TABLET | Freq: Two times a day (BID) | ORAL | Status: DC
  Administered 2023-12-08 (×2): 600 mg via ORAL
  Filled 2023-12-08 (×2): qty 1

## 2023-12-08 MED ORDER — GUAIFENESIN ER 600 MG PO TB12: 600.0000 mg | ORAL_TABLET | Freq: Two times a day (BID) | ORAL | 0 refills | Status: DC

## 2023-12-08 NOTE — Progress Notes (Signed)
 Pt being discharged to [facility]. [facility] here to take pt to [facility]. Pt's vitals stable, pt toileted and night time meds given. Pt has no further concerns at this time . AVS and packet given to [facility].

## 2023-12-08 NOTE — Discharge Planning (Signed)
 [facility] Patient Discharge Orders- [facility] CLINIC: [facility]  Patient's name: Koren Dama Louder Admit/DC Dates: 12/03/2023 - 12/08/2023  Discharge Diagnoses: Acute/chronic respiratory failure Hyperkalemia.    Recent Labs  Lab 12/05/23 1219  HGB 11.3*  K 4.9  CALCIUM  9.7  PHOS 6.3*  ALBUMIN  3.2*   Aranesp : Given: --   Date of last dose/amount: --   PRBC's Given: -- Date/# of units: --  Outpatient Dialysis Orders:  -Heparin : No change   -EDW 62 kg   -[place]: No change   Access intervention/Change: --   Medication Orders   OTHER/APPTS/LABS     Completed by: Maisie Ronnald Acosta PA-C   D/C Meds to be reconciled by nurse after every discharge.    Reviewed by: MD:______ RN_______

## 2023-12-08 NOTE — Discharge Summary (Signed)
 Physician Discharge Summary   [name] MRN: [id number] DOB: 12-06-1970  Admit date:     12/03/2023  Discharge date: 12/08/23  Discharge Physician: Deliliah Room   PCP: Feliciano Devoria LABOR, MD   Recommendations at discharge:    Follow up with PCP in one week. HD as per schedule Use oxygen as prescribed  Discharge Diagnoses: Principal Problem:   Volume overload Active Problems:   ESRD on dialysis (HCC)   Acute on chronic diastolic CHF (congestive heart failure) (HCC)   Acute respiratory failure with hypoxia (HCC)   Essential hypertension   SOB (shortness of breath)   Acute metabolic encephalopathy   Reactive airway disease   Non-insulin  dependent type 2 diabetes mellitus [facility])   Hospital Course: 53 y.o. male with medical history significant of non-insulin -dependent DM type II, ESRD on HD MWF, grade 1 diastolic heart failure with preserved EF 60 to 65%, essential hypertension, chronic muscle fasciculation, reactive airway disease, presented to emergency department via EMS from nursing home with complaining of shortness of breath and altered mental status. S/p HD on 6/18. He was placed on bipap, now on baseline oxygen requirement.  Acute on chronic hypoxic respiratory failure,POA: Resolved   Essential hypertension ESRD, on HD MWF -Patient presented to ED via EMS with complaining of volume overload, shortness of breath need to garment of oxygen and missing dialysis today.  Also nursing home reported patient has intermittent confusion/lethargy. - At presentation to ED O2 sat dropped to 91% on 4.5 L oxygen.  Physical exam showing volume overload.  Patient was placed on BiPAP, now off.  Done. Not requiring supplemental oxygen at rest but needs 5-6 L on exertion. Of note, patient doesn't walk much at baseline.  Non complaint with CPAP -Chest x-ray showed cardiomegaly and pulmonary vascular congestion Elevated BNP around 1388. -Hemodialyzed on 6/23 in the  hospital before discharge. Nephrology on board.   Acute on chronic diastolic heart failure exacerbation: NYHA III. Improving Severe pulmonary hypertension: Likely in the setting of CHF (Grade 2). ECHO done on 12/04/23 showed EF 65-70% with grade 1 diastolic dysfunction. Continue with HD Cardiology follow up   Hyperkalemia: Resolved now   Acute metabolic encephalopathy: Resolved -No acute abnormalities seen on CT Head.   Hyponatremia: Likely hypervolemic. Continue with HD. Nephro on board.     History of reactive airway disease -Off of BIPAP now. - Out patient follow up with pulmonology for PFTs. - Continue albuterol  as needed for any wheezing or shortness of breath.   Non-insulin -dependent DM type II-diet controlled -A1c 5.9.  Continue carb modified diet with POC blood glucose check.     History of chronic generalized muscle fasciculation   Code Status:  Full Code   Diet: Carb modified and renal diet   Consults: Nephrology   Disposition: Back to [facility]        Consultants: Nephrology Procedures performed: None  Disposition: Skilled nursing facility Diet recommendation:  Renal diet DISCHARGE MEDICATION: Allergies as of 12/08/2023       Reactions   Morphine  Itching   Pt prefers not to be given this drug and Allergic, per [facility]        Medication List     TAKE these medications    amLODipine  5 MG tablet Commonly known as: NORVASC  Take 5-10 mg by mouth See admin instructions. Take 10 mg by mouth on Sun/Tues/Thurs/Sat and 5 mg on Mon/Wed/Fri   aspirin  EC 81 MG tablet Take 1 tablet (81 mg total) by mouth  daily.   calcium  acetate 667 MG capsule Commonly known as: PHOSLO  Take 1 capsule (667 mg total) by mouth 3 (three) times daily with meals.   cloNIDine 0.1 MG tablet Commonly known as: CATAPRES Take 0.1 mg by mouth every 12 (twelve) hours as needed (Hypertension).   Combigan  0.2-0.5 % ophthalmic solution Generic drug:  brimonidine -timolol  Place 1 drop into both eyes in the morning and at bedtime.   famotidine  20 MG tablet Commonly known as: PEPCID  Take 20 mg by mouth at bedtime.   gabapentin  300 MG capsule Commonly known as: NEURONTIN  Take 300 mg by mouth daily.   guaiFENesin  600 MG 12 hr tablet Commonly known as: MUCINEX  Take 1 tablet (600 mg total) by mouth 2 (two) times daily.   hydrALAZINE  25 MG tablet Commonly known as: APRESOLINE  Take 25 mg by mouth 3 (three) times daily.   hyoscyamine 0.125 MG Tbdp disintergrating tablet Commonly known as: ANASPAZ Place 0.125 mg under the tongue every 4 (four) hours as needed (excess secretions). Not to exceed 6 doses in 24 hours.   latanoprost  0.005 % ophthalmic solution Commonly known as: XALATAN  Place 1 drop into the left eye at bedtime.   losartan  100 MG tablet Commonly known as: COZAAR  Take 1 tablet (100 mg total) by mouth daily.   Menthol (Topical Analgesic) 5 % Gel Apply 1 application  topically every 8 (eight) hours as needed (for pain).   metolazone  5 MG tablet Commonly known as: ZAROXOLYN  Take 5 mg by mouth at bedtime.   NovaSource Renal Liqd Take 237 mLs by mouth every Monday, Wednesday, and Friday.   Nutritional Supplement Liqd Take 120 mLs by mouth See admin instructions. Med Pass 2.0 liquid: Drink 120 ml's by mouth twice daily on Sun/Tues/Thurs/Sat   ondansetron  4 MG tablet Commonly known as: ZOFRAN  Take 4 mg by mouth every 6 (six) hours as needed for nausea or vomiting.   oxyCODONE -acetaminophen  5-325 MG tablet Commonly known as: PERCOCET/ROXICET Take 1 tablet by mouth 4 (four) times daily.   OXYGEN Inhale 2 L/min into the lungs continuous.   polyethylene glycol 17 g packet Commonly known as: MIRALAX  / GLYCOLAX  Take 17 g by mouth every 12 (twelve) hours as needed for mild constipation or moderate constipation.   tamsulosin  0.4 MG Caps capsule Commonly known as: FLOMAX  Take 0.4 mg by mouth daily.   torsemide  100 MG  tablet Commonly known as: DEMADEX  Take 100 mg by mouth 2 (two) times daily.        Follow-up Information     Feliciano Devoria LABOR, MD Follow up in 1 week(s).   Specialty: Family Medicine Contact information: [address] [place] [address] [phone]                Discharge Exam: Filed Weights   12/06/23 0501 12/07/23 0500 12/08/23 0500  Weight: 58.5 kg 60.1 kg 62.4 kg   General exam: Alert and Awake. Nasal oxygen in place. Blind at baseline. Respiratory system: Clear to auscultation.  Cardiovascular system: S1 & S2 heard, RRR. No JVD, murmurs, rubs, gallops or clicks. No pedal edema. Gastrointestinal system: Abdomen is slightly distended, soft and nontender. No organomegaly or masses felt. Normal bowel sounds heard. Central nervous system:Alert and awake. No focal neurological deficits. Extremities: Symmetric 5 x 5 power. Skin: No rashes, lesions or ulcers Psychiatry: Judgement and insight appear normal. Mood & affect appropriate.  Condition at discharge: good  The results of significant diagnostics from this hospitalization (including imaging, microbiology, ancillary and laboratory) are listed  below for reference.   Imaging Studies: ECHOCARDIOGRAM COMPLETE Result Date: 12/04/2023    ECHOCARDIOGRAM REPORT   Patient Name:   MAJOR SANTERRE Date of Exam: 12/04/2023 Medical Rec #:  [id number]            Height:       65.0 in Accession #:    [id number]           Weight:       118.8 lb Date of Birth:  [birth year]            BSA:          1.585 m Patient Age:    52 years             BP:           145/92 mmHg Patient Gender: M                    HR:           92 bpm. Exam Location:  Inpatient Procedure: 2D Echo, Color Doppler and Cardiac Doppler (Both Spectral and Color            Flow Doppler were utilized during procedure). Indications:    CHF  History:        Patient has prior history of Echocardiogram examinations, most                 recent 01/19/2023. Risk  Factors:Hypertension.  Sonographer:    Benard Stallion Referring Phys: [id number] SUBRINA SUNDIL IMPRESSIONS  1. Left ventricular ejection fraction, by estimation, is 65 to 70%. The left ventricle has normal function. The left ventricle has no regional wall motion abnormalities. There is mild concentric left ventricular hypertrophy. Left ventricular diastolic parameters are consistent with Grade I diastolic dysfunction (impaired relaxation). There is the interventricular septum is flattened in systole and diastole, consistent with right ventricular pressure and volume overload.  2. Right ventricular systolic function is severely reduced. The right ventricular size is severely enlarged. There is severely elevated pulmonary artery systolic pressure. The estimated right ventricular systolic pressure is 101.9 mmHg.  3. Right atrial size was severely dilated.  4. The mitral valve is normal in structure. No evidence of mitral valve regurgitation. No evidence of mitral stenosis.  5. Tricuspid valve regurgitation is moderate.  6. The aortic valve is tricuspid. There is mild calcification of the aortic valve. Aortic valve regurgitation is not visualized. No aortic stenosis is present.  7. The inferior vena cava is dilated in size with <50% respiratory variability, suggesting right atrial pressure of 15 mmHg. FINDINGS  Left Ventricle: Left ventricular ejection fraction, by estimation, is 65 to 70%. The left ventricle has normal function. The left ventricle has no regional wall motion abnormalities. The left ventricular internal cavity size was normal in size. There is  mild concentric left ventricular hypertrophy. The interventricular septum is flattened in systole and diastole, consistent with right ventricular pressure and volume overload. Left ventricular diastolic parameters are consistent with Grade I diastolic dysfunction (impaired relaxation). Right Ventricle: The right ventricular size is severely enlarged. No increase  in right ventricular wall thickness. Right ventricular systolic function is severely reduced. There is severely elevated pulmonary artery systolic pressure. The tricuspid regurgitant velocity is 4.66 m/s, and with an assumed right atrial pressure of 15 mmHg, the estimated right ventricular systolic pressure is 101.9 mmHg. Left Atrium: Left atrial size was normal in size. Right Atrium: Right atrial size was severely dilated. Pericardium: There is  no evidence of pericardial effusion. Mitral Valve: The mitral valve is normal in structure. No evidence of mitral valve regurgitation. No evidence of mitral valve stenosis. Tricuspid Valve: The tricuspid valve is normal in structure. Tricuspid valve regurgitation is moderate . No evidence of tricuspid stenosis. Aortic Valve: The aortic valve is tricuspid. There is mild calcification of the aortic valve. Aortic valve regurgitation is not visualized. No aortic stenosis is present. Aortic valve mean gradient measures 4.0 mmHg. Aortic valve peak gradient measures 6.9 mmHg. Aortic valve area, by VTI measures 1.91 cm. Pulmonic Valve: The pulmonic valve was normal in structure. Pulmonic valve regurgitation is trivial. No evidence of pulmonic stenosis. Aorta: The aortic root is normal in size and structure. Venous: The inferior vena cava is dilated in size with less than 50% respiratory variability, suggesting right atrial pressure of 15 mmHg. IAS/Shunts: No atrial level shunt detected by color flow Doppler.  LEFT VENTRICLE PLAX 2D LVIDd:         3.30 cm   Diastology LVIDs:         2.10 cm   LV e' medial:    5.22 cm/s LV PW:         1.10 cm   LV E/e' medial:  10.0 LV IVS:        1.10 cm   LV e' lateral:   6.96 cm/s LVOT diam:     1.90 cm   LV E/e' lateral: 7.5 LV SV:         36 LV SV Index:   23 LVOT Area:     2.84 cm  RIGHT VENTRICLE RV Basal diam:  5.90 cm RV Mid diam:    4.70 cm RV S prime:     11.30 cm/s TAPSE (M-mode): 0.9 cm LEFT ATRIUM             Index        RIGHT ATRIUM            Index LA Vol (A2C):   17.3 ml 10.91 ml/m  RA Area:     26.50 cm LA Vol (A4C):   19.8 ml 12.49 ml/m  RA Volume:   103.00 ml 64.96 ml/m LA Biplane Vol: 18.9 ml 11.92 ml/m  AORTIC VALVE AV Area (Vmax):    1.89 cm AV Area (Vmean):   1.78 cm AV Area (VTI):     1.91 cm AV Vmax:           131.00 cm/s AV Vmean:          84.500 cm/s AV VTI:            0.187 m AV Peak Grad:      6.9 mmHg AV Mean Grad:      4.0 mmHg LVOT Vmax:         87.40 cm/s LVOT Vmean:        53.100 cm/s LVOT VTI:          0.126 m LVOT/AV VTI ratio: 0.67  AORTA Ao Root diam: 2.40 cm Ao Asc diam:  3.00 cm MITRAL VALVE               TRICUSPID VALVE MV Area (PHT): 5.02 cm    TR Peak grad:   86.9 mmHg MV Decel Time: 151 msec    TR Vmax:        466.00 cm/s MV E velocity: 52.30 cm/s MV A velocity: 76.70 cm/s  SHUNTS MV E/A ratio:  0.68  Systemic VTI:  0.13 m                            Systemic Diam: 1.90 cm Toribio Fuel MD Electronically signed by Toribio Fuel MD Signature Date/Time: 12/04/2023/4:36:20 PM    Final    CT Head Wo Contrast Result Date: 12/04/2023 EXAM: CT HEAD WITHOUT CONTRAST 12/04/2023 02:58:19 AM TECHNIQUE: CT of the head was performed without the administration of intravenous contrast. Automated exposure control, iterative reconstruction, and/or weight based adjustment of the mA/kV was utilized to reduce the radiation dose to as low as reasonably achievable. COMPARISON: 01/19/2023 CLINICAL HISTORY: Mental status change, unknown cause. Chief complaints; Shortness of Breath; Altered Mental Status. FINDINGS: BRAIN AND VENTRICLES: There is no acute intracranial hemorrhage, mass effect or midline shift. No abnormal extra-axial fluid collection. The gray-white differentiation is maintained without an acute infarct. There is no hydrocephalus. Mild subcortical and periventricular small vessel ischemic changes. ORBITS: Right phthisis bulbi. Layering hemorrhage in the left globe (image 28), unchanged. SINUSES: The  visualized paranasal sinuses and mastoid air cells demonstrate no acute abnormality. SOFT TISSUES AND SKULL: No acute abnormality of the visualized skull or soft tissues. IMPRESSION: 1. No acute intracranial abnormality. Mild small vessel ischemic changes. 2. Right phthisis bulbi. Layering hemorrhage in the left globe, unchanged. Electronically signed by: Pinkie Pebbles MD 12/04/2023 03:14 AM EDT RP Workstation: [id number]   DG Chest Portable 1 View Result Date: 12/03/2023 CLINICAL DATA:  sob EXAM: PORTABLE CHEST - 1 VIEW COMPARISON:  January 22, 2023 FINDINGS: Diffuse interstitial opacities throughout both lungs. Hazy perihilar airspace opacities within both mid and lower lung zones. Small to moderate layering right pleural effusion again noted. Trace left pleural effusion. No pneumothorax. Moderate cardiomegaly. No acute fracture or destructive lesion. IMPRESSION: Moderate cardiomegaly with findings of pulmonary edema. Small to moderate layering right and trace left pleural effusions again noted. Electronically Signed   By: Rogelia Myers M.D.   On: 12/03/2023 18:03    Microbiology: Results for orders placed or performed during the hospital encounter of 12/25/22  Blood culture (routine x 2)     Status: None   Collection Time: 12/25/22  8:28 PM   Specimen: BLOOD LEFT FOREARM  Result Value Ref Range Status   Specimen Description BLOOD LEFT FOREARM  Final   Special Requests   Final    BOTTLES DRAWN AEROBIC AND ANAEROBIC Blood Culture adequate volume   Culture   Final    NO GROWTH 5 DAYS Performed at [facility], [address] [place], [place] [address]    Report Status 12/30/2022 FINAL  Final  Blood culture (routine x 2)     Status: None   Collection Time: 12/25/22  9:15 PM   Specimen: BLOOD LEFT WRIST  Result Value Ref Range Status   Specimen Description BLOOD LEFT WRIST  Final   Special Requests   Final    BOTTLES DRAWN AEROBIC AND ANAEROBIC Blood Culture adequate volume    Culture   Final    NO GROWTH 5 DAYS Performed at [facility], [address] [place], [place] [address]    Report Status 12/30/2022 FINAL  Final  Respiratory (~20 pathogens) panel by PCR     Status: None   Collection Time: 12/25/22 10:47 PM   Specimen: Nasopharyngeal Swab; Respiratory  Result Value Ref Range Status   Adenovirus NOT DETECTED NOT DETECTED Final   Coronavirus 229E NOT DETECTED NOT DETECTED Final  Comment: (NOTE) The Coronavirus on the Respiratory Panel, DOES NOT test for the novel  Coronavirus (2019 nCoV)    Coronavirus HKU1 NOT DETECTED NOT DETECTED Final   Coronavirus NL63 NOT DETECTED NOT DETECTED Final   Coronavirus OC43 NOT DETECTED NOT DETECTED Final   Metapneumovirus NOT DETECTED NOT DETECTED Final   Rhinovirus / Enterovirus NOT DETECTED NOT DETECTED Final   Influenza A NOT DETECTED NOT DETECTED Final   Influenza B NOT DETECTED NOT DETECTED Final   Parainfluenza Virus 1 NOT DETECTED NOT DETECTED Final   Parainfluenza Virus 2 NOT DETECTED NOT DETECTED Final   Parainfluenza Virus 3 NOT DETECTED NOT DETECTED Final   Parainfluenza Virus 4 NOT DETECTED NOT DETECTED Final   Respiratory Syncytial Virus NOT DETECTED NOT DETECTED Final   Bordetella pertussis NOT DETECTED NOT DETECTED Final   Bordetella Parapertussis NOT DETECTED NOT DETECTED Final   Chlamydophila pneumoniae NOT DETECTED NOT DETECTED Final   Mycoplasma pneumoniae NOT DETECTED NOT DETECTED Final    Comment: Performed at [facility], [address] [place], [place] [address]  Resp panel by RT-PCR (RSV, Flu A&B, Covid) Anterior Nasal Swab     Status: None   Collection Time: 12/25/22 10:47 PM   Specimen: Anterior Nasal Swab  Result Value Ref Range Status   SARS Coronavirus 2 by RT PCR NEGATIVE NEGATIVE Final   Influenza A by PCR NEGATIVE NEGATIVE Final   Influenza B by PCR NEGATIVE NEGATIVE Final    Comment: (NOTE) The Xpert Xpress SARS-CoV-2/FLU/RSV plus assay is intended as an  aid in the diagnosis of influenza from Nasopharyngeal swab specimens and should not be used as a sole basis for treatment. Nasal washings and aspirates are unacceptable for Xpert Xpress SARS-CoV-2/FLU/RSV testing.  Fact Sheet for Patients: BloggerCourse.com  Fact Sheet for Healthcare Providers: SeriousBroker.it  This test is not yet approved or cleared by the [place]  FDA and has been authorized for detection and/or diagnosis of SARS-CoV-2 by FDA under an Emergency Use Authorization (EUA). This EUA will remain in effect (meaning this test can be used) for the duration of the COVID-19 declaration under Section 564(b)(1) of the Act, 21 U.S.C. section 360bbb-3(b)(1), unless the authorization is terminated or revoked.     Resp Syncytial Virus by PCR NEGATIVE NEGATIVE Final    Comment: (NOTE) Fact Sheet for Patients: BloggerCourse.com  Fact Sheet for Healthcare Providers: SeriousBroker.it  This test is not yet approved or cleared by the [place]  FDA and has been authorized for detection and/or diagnosis of SARS-CoV-2 by FDA under an Emergency Use Authorization (EUA). This EUA will remain in effect (meaning this test can be used) for the duration of the COVID-19 declaration under Section 564(b)(1) of the Act, 21 U.S.C. section 360bbb-3(b)(1), unless the authorization is terminated or revoked.  Performed at [facility], [address], [place], [place] [address]   MRSA Next Gen by PCR, Nasal     Status: Abnormal   Collection Time: 12/26/22  3:27 AM   Specimen: Nasal Mucosa; Nasal Swab  Result Value Ref Range Status   MRSA by PCR Next Gen DETECTED (A) NOT DETECTED Final    Comment: RESULT CALLED TO, READ BACK BY AND VERIFIED WITH: E DOLLAN 12/26/22 @ 0549 BY AB (NOTE) The GeneXpert MRSA Assay (FDA approved for NASAL specimens only), is one component of a  comprehensive MRSA colonization surveillance program. It is not intended to diagnose MRSA infection nor to guide or monitor treatment for MRSA infections. Test performance is  not FDA approved in patients less than 51 years old. Performed at [facility], [address] [place], [place] [address]     Labs: CBC: Recent Labs  Lab 12/03/23 1711 12/03/23 1716 12/03/23 1717 12/04/23 0956 12/05/23 0227 12/05/23 1219  WBC 7.7  --   --  11.2* 9.8 9.1  NEUTROABS 5.6  --   --   --  7.2  --   HGB 11.9* 13.3 14.3 11.8* 10.9* 11.3*  HCT 38.0* 39.0 42.0 37.0* 33.9* 35.0*  MCV 104.7*  --   --  103.1* 102.1* 102.3*  PLT 114*  --   --  115* 115* 111*   Basic Metabolic Panel: Recent Labs  Lab 12/03/23 1711 12/03/23 1716 12/03/23 1717 12/04/23 0956 12/04/23 1342 12/05/23 0227 12/05/23 1219  NA 136   < > 134* 134*  135 134* 133* 132*  K 6.1*   < > 7.1* 4.3  4.4 4.7 4.9 4.9  CL 92*  --  96* 89*  90* 91* 89* 91*  CO2 25  --   --  26  26 26 28 23   GLUCOSE 110*  --  107* 87  86 89 129* 135*  BUN 38*  --  53* 23*  23* 26* 36* 44*  CREATININE 7.49*  --  7.30* 5.14*  5.04* 5.54* 6.57* 7.04*  CALCIUM  10.3  --   --  9.7  9.7 9.9 9.7 9.7  PHOS  --   --   --  5.2*  --   --  6.3*   < > = values in this interval not displayed.   Liver Function Tests: Recent Labs  Lab 12/03/23 1711 12/04/23 0956 12/05/23 1219  AST 16 19  --   ALT 10 13  --   ALKPHOS 68 68  --   BILITOT 1.3* 1.7*  --   PROT 8.2* 7.8  --   ALBUMIN  3.6 3.3*  3.3* 3.2*   CBG: Recent Labs  Lab 12/07/23 0636 12/07/23 1137 12/07/23 1548 12/08/23 0700 12/08/23 1127  GLUCAP 180* 155* 189* 168* 173*    Discharge time spent: greater than 30 minutes.  Signed: Deliliah Room, MD [facility] 12/08/2023

## 2023-12-08 NOTE — TOC Transition Note (Signed)
 Transition of Care [facility]) - Discharge Note   Patient Details  Name: Christopher Davila MRN: [id number] Date of Birth: February 01, 1971  Transition of Care [facility]) CM/SW Contact:  Luise JAYSON Pan, LCSWA Phone Number: 12/08/2023, 3:33 PM   Clinical Narrative:   Patient will DC to: [place] Anticipated DC date: 12/08/23  Family notified: Left VM for Nena (cousin) Transport by: [facility]   Per MD patient ready for DC to [facility]. RN to call report prior to discharge [phone]). RN, patient, patient's family, and facility notified of DC. Discharge Summary and FL2 sent to facility. DC packet on chart. Ambulance transport requested for patient at 3:32PM for 8:30PM today.   CSW will sign off for now as social work intervention is no longer needed. Please consult us  again if new needs arise.      Final next level of care: Skilled Nursing Facility Barriers to Discharge: Barriers Resolved   Patient Goals and CMS Choice            Discharge Placement              Patient chooses bed at: [facility] Patient to be transferred to facility by: [facility] Name of family member notified: Nena (cousin) Patient and family notified of of transfer: 12/08/23  Discharge Plan and Services Additional resources added to the After Visit Summary for                                       Social Drivers of Health (SDOH) Interventions SDOH Screenings   Food Insecurity: No Food Insecurity (12/04/2023)  Housing: Low Risk  (12/08/2023)  Transportation Needs: No Transportation Needs (12/04/2023)  Utilities: Not At Risk (12/04/2023)  Tobacco Use: Medium Risk (12/03/2023)     Readmission Risk Interventions    12/05/2023    3:04 PM 01/22/2023    1:23 PM 04/06/2021    1:01 PM  Readmission Risk Prevention Plan  Transportation Screening Complete Complete Complete  PCP or Specialist Appt within 3-5 Days Complete    HRI or Home Care Consult Complete    Social Work Consult for Recovery Care  Planning/Counseling Complete    Palliative Care Screening Not Applicable    Medication Review Oceanographer) Complete Complete Complete  PCP or Specialist appointment within 3-5 days of discharge  Complete Complete  HRI or Home Care Consult   Complete  SW Recovery Care/Counseling Consult  Complete Complete  Palliative Care Screening   Not Applicable  Skilled Nursing Facility  Complete Complete

## 2023-12-08 NOTE — Progress Notes (Signed)
  [facility] Progress Note   Subjective:   Seen in room. On 4L [place] this am. No specific complaints. Asks about kidney function. For dialysis today.   Objective Vitals:   12/08/23 0500 12/08/23 0535 12/08/23 0729 12/08/23 0818  BP:  (!) 145/90 (!) 120/98 (!) 120/98  Pulse:   99   Resp:   18   Temp:  98.7 F (37.1 C) 98.5 F (36.9 C)   TempSrc:  Axillary Oral   SpO2:  99% 93%   Weight: 62.4 kg      Physical Exam General: Chronically ill man, NAD. Nasal O2 in place Heart: RRR; no murmur Lungs: CTA anteriorly Abdomen: soft Extremities: no LE edema Dialysis Access: RUE AVF +t/b  Additional Objective Labs: Basic Metabolic Panel: Recent Labs  Lab 12/04/23 0956 12/04/23 1342 12/05/23 0227 12/05/23 1219  NA 134*  135 134* 133* 132*  K 4.3  4.4 4.7 4.9 4.9  CL 89*  90* 91* 89* 91*  CO2 26  26 26 28 23   GLUCOSE 87  86 89 129* 135*  BUN 23*  23* 26* 36* 44*  CREATININE 5.14*  5.04* 5.54* 6.57* 7.04*  CALCIUM  9.7  9.7 9.9 9.7 9.7  PHOS 5.2*  --   --  6.3*   Liver Function Tests: Recent Labs  Lab 12/03/23 1711 12/04/23 0956 12/05/23 1219  AST 16 19  --   ALT 10 13  --   ALKPHOS 68 68  --   BILITOT 1.3* 1.7*  --   PROT 8.2* 7.8  --   ALBUMIN  3.6 3.3*  3.3* 3.2*   CBC: Recent Labs  Lab 12/03/23 1711 12/03/23 1716 12/04/23 0956 12/05/23 0227 12/05/23 1219  WBC 7.7  --  11.2* 9.8 9.1  NEUTROABS 5.6  --   --  7.2  --   HGB 11.9*   < > 11.8* 10.9* 11.3*  HCT 38.0*   < > 37.0* 33.9* 35.0*  MCV 104.7*  --  103.1* 102.1* 102.3*  PLT 114*  --  115* 115* 111*   < > = values in this interval not displayed.   Studies/Results: No results found.  Medications:   amLODipine   10 mg Oral Q T,Th,S,Su   amLODipine   5 mg Oral Q M,W,F   calcium  acetate  667 mg Oral TID WC   Chlorhexidine  Gluconate Cloth  6 each Topical Q0600   Chlorhexidine  Gluconate Cloth  6 each Topical Q0600   famotidine   20 mg Oral QHS   gabapentin   300 mg Oral Daily    guaiFENesin   600 mg Oral BID   hydrALAZINE   25 mg Oral TID   losartan   100 mg Oral Daily   metolazone   5 mg Oral QHS   sodium chloride  flush  3 mL Intravenous Q12H   sodium chloride  flush  3 mL Intravenous Q12H   tamsulosin   0.4 mg Oral Daily    Dialysis Orders MWF - [facility] 4hr, 400/600, EDW 64kg, 2K/2Ca bath, AVF, heparin  3000 unit bolus - Hectoral 6mcg IV q HD - no ESA  Assessment/Plan: Hyperkalemia: Resolved with HD Acute on chronic respiratory fialure : Started on BiPAP, now weaned to nasal O2. Continue to maximize UF with HD ESRD: Usual MWF schedule - next HD 6/23 HTN/volume: BP stable, volume improving. Below EDW now - keep lowering. Anemia of ESRD: Hgb 11.3 - no ESA Secondary HPTH: Phos coming down - continue home Phoslo . Dispo: dialysis today. Should be ok for discharge after   Maisie Ronnald Acosta  PA-C [facility] 12/08/2023,10:25 AM

## 2023-12-08 NOTE — Progress Notes (Signed)
 Pt cut off 10 mins d/t system clotted. Pt goal met.  12/08/23 1724  Vitals  Temp 98 F (36.7 C)  Temp Source Oral  BP Location Left Arm  BP Method Automatic  Patient Position (if appropriate) Lying  Oxygen Therapy  O2 Device Nasal Cannula  O2 Flow Rate (L/min) 3 L/min  During Treatment Monitoring  Intra-Hemodialysis Comments Tx completed  Post Treatment  Dialyzer Clearance Clotted  Hemodialysis Intake (mL) 0 mL  Liters Processed 72  Fluid Removed (mL) 2200 mL  Tolerated HD Treatment Yes  Post-Hemodialysis Comments Pt goal met.  AVG/AVF Arterial Site Held (minutes) 7 minutes  AVG/AVF Venous Site Held (minutes) 7 minutes

## 2023-12-08 NOTE — Progress Notes (Signed)
 Mobility Specialist Progress Note:    12/08/23 1020  Mobility  Activity Transferred from bed to chair  Level of Assistance Minimal assist, patient does 75% or more  Assistive Device Front wheel walker  Activity Response Tolerated well  Mobility Referral Yes  Mobility visit 1 Mobility  Mobility Specialist Start Time (ACUTE ONLY) 1020  Mobility Specialist Stop Time (ACUTE ONLY) 1043  Mobility Specialist Time Calculation (min) (ACUTE ONLY) 23 min   Pt agreeable to session however refused ambulation due to bilateral knee pain. Upon entering, received patient with Christopher Davila off. W/standing, pt is off balance but is well with cueing and guiding. Left pt comfortable in recliner. Alerted RN of O2 and pt needs.   Pre Mobility: EOB SpO2 85-90% 4L [place] Mobility: In chair SpO2 88-90% 4L [place]  Therisa Rana Mobility Specialist Please contact via Special educational needs teacher or  Rehab office at [phone]

## 2023-12-08 NOTE — Progress Notes (Addendum)
 Pt receives out-pt HD at [facility] on MWF 11:45 am chair time. Pt from snf and for possible d/c today per CSW. Will assist as needed.   Randine Mungo Renal Navigator [phone]  Addendum at 2:33 pm: Pt to d/c today back to snf after HD. Contacted [facility] to be advised of pt's d/c today and that pt should resume care on Wednesday.

## 2023-12-08 NOTE — NC FL2 (Signed)
 [place]  MEDICAID FL2 LEVEL OF CARE FORM     IDENTIFICATION  Patient Name: Christopher Davila Birthdate: [birth year] Sex: male Admission Date (Current Location): 12/03/2023  [facility] and [place] Number:  Producer, television/film/video and Address:  [facility] [place], [address] [place], [place] [address]      Provider Number: [id number]  Attending Physician Name and Address:  Dino Antu, MD  Relative Name and Phone Number:       Current Level of Care: Hospital Recommended Level of Care: Skilled Nursing Facility Prior Approval Number:    Date Approved/Denied:   PASRR Number: [id number]  Discharge Plan: SNF    Current Diagnoses: Patient Active Problem List   Diagnosis Date Noted   Reactive airway disease 12/03/2023   Non-insulin  dependent type 2 diabetes mellitus (HCC) 12/03/2023   Acute on chronic diastolic CHF (congestive heart failure) (HCC) 12/03/2023   Agitation 01/20/2023   MRSA (methicillin resistant Staphylococcus aureus) nasal colonization 12/26/2022   Loculated pleural effusion 12/26/2022   Pneumonia 12/25/2022   Acute metabolic encephalopathy 06/13/2022   Acute encephalopathy 06/12/2022   Pleural effusion, bilateral 06/12/2022   Hyperkalemia 06/12/2022   Chronic respiratory failure with hypoxia (HCC) 06/12/2022   Generalized weakness 11/05/2021   Pain due to onychomycosis of toenails of both feet 07/31/2021   Skin lesion 07/31/2021   Volume overload    History of thoracentesis    Acute respiratory failure with hypoxia (HCC) 03/26/2021   Pressure injury of skin 11/06/2019   ESRD on dialysis [facility])    Heart failure with preserved ejection fraction (HCC)    Goals of care, counseling/discussion    Weakness 10/05/2019   Vision loss of left eye 09/21/2019   Homeless 09/19/2019   Anemia of chronic disease 09/19/2019   Vision loss, left eye 09/19/2019   Acute loss of vision, right 06/16/2019   Alcohol  use    Gastroparesis due to DM  (HCC)    Essential hypertension    SOB (shortness of breath)    Hyperlipidemia     Orientation RESPIRATION BLADDER Height & Weight     Self, Time, Situation, Place  O2 (3L O2 [place]) Continent Weight: 137 lb 9.1 oz (62.4 kg) Height:  5' 5 (165.1 cm)  BEHAVIORAL SYMPTOMS/MOOD NEUROLOGICAL BOWEL NUTRITION STATUS      Continent Diet (see dc summary)  AMBULATORY STATUS COMMUNICATION OF NEEDS Skin     Verbally Normal                       Personal Care Assistance Level of Assistance              Functional Limitations Info  Sight, Hearing, Speech Sight Info: Adequate Hearing Info: Adequate Speech Info: Adequate    SPECIAL CARE FACTORS FREQUENCY                       Contractures Contractures Info: Not present    Additional Factors Info  Code Status, Allergies Code Status Info: Full Allergies Info: Morphine            Current Medications (12/08/2023):  This is the current hospital active medication list Current Facility-Administered Medications  Medication Dose Route Frequency Provider Last Rate Last Admin   acetaminophen  (TYLENOL ) tablet 650 mg  650 mg Oral Q6H PRN Sundil, Subrina, MD       Or   acetaminophen  (TYLENOL ) suppository 650 mg  650 mg Rectal Q6H PRN Sundil, Subrina, MD  amLODipine  (NORVASC ) tablet 10 mg  10 mg Oral Q T,Th,S,Su Sundil, Subrina, MD   10 mg at 12/07/23 1100   amLODipine  (NORVASC ) tablet 5 mg  5 mg Oral Q M,W,F Sundil, Subrina, MD   5 mg at 12/08/23 0818   calcium  acetate (PHOSLO ) capsule 667 mg  667 mg Oral TID WC Sundil, Subrina, MD   667 mg at 12/08/23 1231   Chlorhexidine  Gluconate Cloth 2 % PADS 6 each  6 each Topical Q0600 Sundil, Subrina, MD   6 each at 12/08/23 0534   Chlorhexidine  Gluconate Cloth 2 % PADS 6 each  6 each Topical Q0600 Gearline Norris, MD   6 each at 12/08/23 0535   famotidine  (PEPCID ) tablet 20 mg  20 mg Oral QHS Sundil, Subrina, MD   20 mg at 12/07/23 2155   gabapentin  (NEURONTIN ) capsule 300 mg  300 mg  Oral Daily Sundil, Subrina, MD   300 mg at 12/08/23 0818   guaiFENesin  (MUCINEX ) 12 hr tablet 600 mg  600 mg Oral BID Rashid, Farhan, MD   600 mg at 12/08/23 1230   hydrALAZINE  (APRESOLINE ) tablet 25 mg  25 mg Oral TID Sundil, Subrina, MD   25 mg at 12/08/23 9180   ipratropium-albuterol  (DUONEB) 0.5-2.5 (3) MG/3ML nebulizer solution 3 mL  3 mL Nebulization Q4H PRN Sundil, Subrina, MD       losartan  (COZAAR ) tablet 100 mg  100 mg Oral Daily Sundil, Subrina, MD   100 mg at 12/08/23 0818   metolazone  (ZAROXOLYN ) tablet 5 mg  5 mg Oral QHS Sundil, Subrina, MD   5 mg at 12/07/23 2155   ondansetron  (ZOFRAN ) tablet 4 mg  4 mg Oral Q6H PRN Sundil, Subrina, MD       Or   ondansetron  (ZOFRAN ) injection 4 mg  4 mg Intravenous Q6H PRN Sundil, Subrina, MD       oxyCODONE -acetaminophen  (PERCOCET/ROXICET) 5-325 MG per tablet 1 tablet  1 tablet Oral Q6H PRN Sundil, Subrina, MD   1 tablet at 12/08/23 1231   polyethylene glycol (MIRALAX  / GLYCOLAX ) packet 17 g  17 g Oral Q12H PRN Sundil, Subrina, MD       sodium chloride  flush (NS) 0.9 % injection 3 mL  3 mL Intravenous Q12H Sundil, Subrina, MD   3 mL at 12/07/23 2156   sodium chloride  flush (NS) 0.9 % injection 3 mL  3 mL Intravenous Q12H Sundil, Subrina, MD   3 mL at 12/08/23 0820   sodium chloride  flush (NS) 0.9 % injection 3 mL  3 mL Intravenous PRN Sundil, Subrina, MD       tamsulosin  (FLOMAX ) capsule 0.4 mg  0.4 mg Oral Daily Sundil, Subrina, MD   0.4 mg at 12/08/23 9181     Discharge Medications: Please see discharge summary for a list of discharge medications.  Relevant Imaging Results:  Relevant Lab Results:   Additional Information [id number] HD at [facility] on MWF 11:45 chair time  Blue Winther C Akyah Lagrange, LCSWA

## 2023-12-22 ENCOUNTER — Inpatient Hospital Stay (HOSPITAL_COMMUNITY)
Admission: EM | Admit: 2023-12-22 | Discharge: 2024-01-16 | DRG: 871 | Disposition: E | Attending: Internal Medicine | Admitting: Internal Medicine

## 2023-12-22 ENCOUNTER — Emergency Department (HOSPITAL_COMMUNITY)

## 2023-12-22 DIAGNOSIS — E119 Type 2 diabetes mellitus without complications: Secondary | ICD-10-CM

## 2023-12-22 DIAGNOSIS — A419 Sepsis, unspecified organism: Principal | ICD-10-CM | POA: Diagnosis present

## 2023-12-22 DIAGNOSIS — I5082 Biventricular heart failure: Secondary | ICD-10-CM | POA: Diagnosis present

## 2023-12-22 DIAGNOSIS — I5033 Acute on chronic diastolic (congestive) heart failure: Secondary | ICD-10-CM | POA: Diagnosis present

## 2023-12-22 DIAGNOSIS — I2729 Other secondary pulmonary hypertension: Secondary | ICD-10-CM | POA: Diagnosis present

## 2023-12-22 DIAGNOSIS — G9340 Encephalopathy, unspecified: Secondary | ICD-10-CM | POA: Diagnosis present

## 2023-12-22 DIAGNOSIS — E875 Hyperkalemia: Principal | ICD-10-CM | POA: Diagnosis present

## 2023-12-22 DIAGNOSIS — Z885 Allergy status to narcotic agent status: Secondary | ICD-10-CM

## 2023-12-22 DIAGNOSIS — K7200 Acute and subacute hepatic failure without coma: Secondary | ICD-10-CM | POA: Diagnosis present

## 2023-12-22 DIAGNOSIS — R296 Repeated falls: Secondary | ICD-10-CM | POA: Diagnosis present

## 2023-12-22 DIAGNOSIS — K761 Chronic passive congestion of liver: Secondary | ICD-10-CM | POA: Diagnosis present

## 2023-12-22 DIAGNOSIS — S36119A Unspecified injury of liver, initial encounter: Secondary | ICD-10-CM

## 2023-12-22 DIAGNOSIS — Z7982 Long term (current) use of aspirin: Secondary | ICD-10-CM

## 2023-12-22 DIAGNOSIS — K219 Gastro-esophageal reflux disease without esophagitis: Secondary | ICD-10-CM | POA: Diagnosis present

## 2023-12-22 DIAGNOSIS — E1141 Type 2 diabetes mellitus with diabetic mononeuropathy: Secondary | ICD-10-CM | POA: Diagnosis present

## 2023-12-22 DIAGNOSIS — R001 Bradycardia, unspecified: Secondary | ICD-10-CM | POA: Diagnosis present

## 2023-12-22 DIAGNOSIS — Z79899 Other long term (current) drug therapy: Secondary | ICD-10-CM

## 2023-12-22 DIAGNOSIS — Z8249 Family history of ischemic heart disease and other diseases of the circulatory system: Secondary | ICD-10-CM

## 2023-12-22 DIAGNOSIS — Z825 Family history of asthma and other chronic lower respiratory diseases: Secondary | ICD-10-CM

## 2023-12-22 DIAGNOSIS — D689 Coagulation defect, unspecified: Secondary | ICD-10-CM | POA: Diagnosis present

## 2023-12-22 DIAGNOSIS — N2581 Secondary hyperparathyroidism of renal origin: Secondary | ICD-10-CM | POA: Diagnosis present

## 2023-12-22 DIAGNOSIS — J189 Pneumonia, unspecified organism: Secondary | ICD-10-CM | POA: Diagnosis present

## 2023-12-22 DIAGNOSIS — H548 Legal blindness, as defined in USA: Secondary | ICD-10-CM | POA: Diagnosis present

## 2023-12-22 DIAGNOSIS — D539 Nutritional anemia, unspecified: Secondary | ICD-10-CM | POA: Diagnosis present

## 2023-12-22 DIAGNOSIS — E1143 Type 2 diabetes mellitus with diabetic autonomic (poly)neuropathy: Secondary | ICD-10-CM | POA: Diagnosis present

## 2023-12-22 DIAGNOSIS — R570 Cardiogenic shock: Secondary | ICD-10-CM | POA: Diagnosis present

## 2023-12-22 DIAGNOSIS — J9621 Acute and chronic respiratory failure with hypoxia: Secondary | ICD-10-CM | POA: Diagnosis present

## 2023-12-22 DIAGNOSIS — E1122 Type 2 diabetes mellitus with diabetic chronic kidney disease: Secondary | ICD-10-CM | POA: Diagnosis present

## 2023-12-22 DIAGNOSIS — I132 Hypertensive heart and chronic kidney disease with heart failure and with stage 5 chronic kidney disease, or end stage renal disease: Secondary | ICD-10-CM | POA: Diagnosis present

## 2023-12-22 DIAGNOSIS — E11649 Type 2 diabetes mellitus with hypoglycemia without coma: Secondary | ICD-10-CM | POA: Diagnosis present

## 2023-12-22 DIAGNOSIS — J9601 Acute respiratory failure with hypoxia: Secondary | ICD-10-CM | POA: Diagnosis present

## 2023-12-22 DIAGNOSIS — N186 End stage renal disease: Secondary | ICD-10-CM

## 2023-12-22 DIAGNOSIS — E872 Acidosis, unspecified: Secondary | ICD-10-CM | POA: Diagnosis present

## 2023-12-22 DIAGNOSIS — G709 Myoneural disorder, unspecified: Secondary | ICD-10-CM | POA: Diagnosis present

## 2023-12-22 DIAGNOSIS — E1165 Type 2 diabetes mellitus with hyperglycemia: Secondary | ICD-10-CM | POA: Diagnosis present

## 2023-12-22 DIAGNOSIS — Z8616 Personal history of COVID-19: Secondary | ICD-10-CM

## 2023-12-22 DIAGNOSIS — Z992 Dependence on renal dialysis: Secondary | ICD-10-CM

## 2023-12-22 DIAGNOSIS — I4901 Ventricular fibrillation: Principal | ICD-10-CM | POA: Diagnosis not present

## 2023-12-22 DIAGNOSIS — R6521 Severe sepsis with septic shock: Secondary | ICD-10-CM | POA: Diagnosis present

## 2023-12-22 DIAGNOSIS — K3184 Gastroparesis: Secondary | ICD-10-CM | POA: Diagnosis present

## 2023-12-22 DIAGNOSIS — R188 Other ascites: Secondary | ICD-10-CM | POA: Diagnosis present

## 2023-12-22 DIAGNOSIS — D509 Iron deficiency anemia, unspecified: Secondary | ICD-10-CM | POA: Diagnosis present

## 2023-12-22 DIAGNOSIS — D696 Thrombocytopenia, unspecified: Secondary | ICD-10-CM | POA: Diagnosis present

## 2023-12-22 DIAGNOSIS — G8929 Other chronic pain: Secondary | ICD-10-CM | POA: Diagnosis present

## 2023-12-22 DIAGNOSIS — Z91199 Patient's noncompliance with other medical treatment and regimen due to unspecified reason: Secondary | ICD-10-CM

## 2023-12-22 DIAGNOSIS — Z87891 Personal history of nicotine dependence: Secondary | ICD-10-CM

## 2023-12-22 DIAGNOSIS — I469 Cardiac arrest, cause unspecified: Secondary | ICD-10-CM | POA: Diagnosis present

## 2023-12-22 DIAGNOSIS — J45909 Unspecified asthma, uncomplicated: Secondary | ICD-10-CM | POA: Diagnosis present

## 2023-12-22 DIAGNOSIS — K7682 Hepatic encephalopathy: Secondary | ICD-10-CM | POA: Diagnosis present

## 2023-12-22 DIAGNOSIS — G9341 Metabolic encephalopathy: Secondary | ICD-10-CM | POA: Diagnosis present

## 2023-12-22 DIAGNOSIS — Z833 Family history of diabetes mellitus: Secondary | ICD-10-CM

## 2023-12-22 DIAGNOSIS — M25562 Pain in left knee: Secondary | ICD-10-CM | POA: Diagnosis present

## 2023-12-22 LAB — CBC WITH DIFFERENTIAL/PLATELET
Abs Immature Granulocytes: 0.09 K/uL — ABNORMAL HIGH (ref 0.00–0.07)
Basophils Absolute: 0 K/uL (ref 0.0–0.1)
Basophils Relative: 0 %
Eosinophils Absolute: 0 K/uL (ref 0.0–0.5)
Eosinophils Relative: 0 %
HCT: 39 % (ref 39.0–52.0)
Hemoglobin: 12.3 g/dL — ABNORMAL LOW (ref 13.0–17.0)
Immature Granulocytes: 1 %
Lymphocytes Relative: 6 %
Lymphs Abs: 0.8 K/uL (ref 0.7–4.0)
MCH: 32.5 pg (ref 26.0–34.0)
MCHC: 31.5 g/dL (ref 30.0–36.0)
MCV: 103.2 fL — ABNORMAL HIGH (ref 80.0–100.0)
Monocytes Absolute: 0.7 K/uL (ref 0.1–1.0)
Monocytes Relative: 5 %
Neutro Abs: 11.8 K/uL — ABNORMAL HIGH (ref 1.7–7.7)
Neutrophils Relative %: 88 %
Platelets: 100 K/uL — ABNORMAL LOW (ref 150–400)
RBC: 3.78 MIL/uL — ABNORMAL LOW (ref 4.22–5.81)
RDW: 15.6 % — ABNORMAL HIGH (ref 11.5–15.5)
WBC: 13.5 K/uL — ABNORMAL HIGH (ref 4.0–10.5)
nRBC: 0 % (ref 0.0–0.2)

## 2023-12-22 LAB — COMPREHENSIVE METABOLIC PANEL WITH GFR
ALT: 181 U/L — ABNORMAL HIGH (ref 0–44)
AST: 234 U/L — ABNORMAL HIGH (ref 15–41)
Albumin: 3.5 g/dL (ref 3.5–5.0)
Alkaline Phosphatase: 95 U/L (ref 38–126)
Anion gap: 18 — ABNORMAL HIGH (ref 5–15)
BUN: 23 mg/dL — ABNORMAL HIGH (ref 6–20)
CO2: 23 mmol/L (ref 22–32)
Calcium: 9 mg/dL (ref 8.9–10.3)
Chloride: 92 mmol/L — ABNORMAL LOW (ref 98–111)
Creatinine, Ser: 4.47 mg/dL — ABNORMAL HIGH (ref 0.61–1.24)
GFR, Estimated: 15 mL/min — ABNORMAL LOW (ref >60)
Glucose, Bld: 76 mg/dL (ref 70–99)
Potassium: 6.8 mmol/L (ref 3.5–5.1)
Sodium: 133 mmol/L — ABNORMAL LOW (ref 135–145)
Total Bilirubin: 2.4 mg/dL — ABNORMAL HIGH (ref 0.0–1.2)
Total Protein: 7.6 g/dL (ref 6.5–8.1)

## 2023-12-22 LAB — PROTIME-INR
INR: 1.7 — ABNORMAL HIGH (ref 0.8–1.2)
Prothrombin Time: 20.7 s — ABNORMAL HIGH (ref 11.4–15.2)

## 2023-12-22 LAB — I-STAT CG4 LACTIC ACID, ED: Lactic Acid, Venous: 4.9 mmol/L (ref 0.5–1.9)

## 2023-12-22 LAB — MAGNESIUM: Magnesium: 2.2 mg/dL (ref 1.7–2.4)

## 2023-12-22 MED ORDER — SODIUM ZIRCONIUM CYCLOSILICATE 10 G PO PACK
10.0000 g | PACK | Freq: Once | ORAL | Status: AC
  Administered 2023-12-22: 10 g via ORAL
  Filled 2023-12-22: qty 1

## 2023-12-22 MED ORDER — SODIUM ZIRCONIUM CYCLOSILICATE 10 G PO PACK
10.0000 g | PACK | Freq: Three times a day (TID) | ORAL | Status: DC
  Administered 2023-12-23: 10 g via ORAL
  Filled 2023-12-22: qty 1

## 2023-12-22 MED ORDER — SODIUM CHLORIDE 0.9 % IV BOLUS: 1000.0000 mL | Freq: Once | INTRAVENOUS | Status: DC

## 2023-12-22 MED ORDER — CHLORHEXIDINE GLUCONATE CLOTH 2 % EX PADS
6.0000 | MEDICATED_PAD | Freq: Every day | CUTANEOUS | Status: DC
  Administered 2023-12-23 – 2023-12-24 (×2): 6 via TOPICAL

## 2023-12-22 MED ORDER — DEXTROSE 50 % IV SOLN
1.0000 | Freq: Once | INTRAVENOUS | Status: AC
  Administered 2023-12-22: 50 mL via INTRAVENOUS
  Filled 2023-12-22: qty 50

## 2023-12-22 MED ORDER — IOHEXOL 350 MG/ML SOLN
75.0000 mL | Freq: Once | INTRAVENOUS | Status: AC | PRN
  Administered 2023-12-22: 75 mL via INTRAVENOUS

## 2023-12-22 MED ORDER — INSULIN ASPART 100 UNIT/ML IV SOLN
5.0000 [IU] | Freq: Once | INTRAVENOUS | Status: AC
  Administered 2023-12-22: 5 [IU] via INTRAVENOUS

## 2023-12-22 MED ORDER — SODIUM CHLORIDE 0.9 % IV BOLUS
250.0000 mL | Freq: Once | INTRAVENOUS | Status: AC
  Administered 2023-12-22: 250 mL via INTRAVENOUS

## 2023-12-22 NOTE — ED Notes (Signed)
 Unable to obtain 2nd set of blood culture, quantity not suffice.  KM

## 2023-12-22 NOTE — Progress Notes (Signed)
 Called by ED MD regarding K of 6.8.  He had almost a full treatment of HD earlier today so unclear why his K is so high.  Due to high census and staffing issues, we are unable to perform HD today but will plan for first shift in am.  No ECG changes per ED MD.  Temporize with Lokelma  10 gm tid and follow K levels.  He may have recirculation of his AVF if his K is truly elevated.

## 2023-12-22 NOTE — ED Notes (Signed)
 Davina on call nurse NP [phone] WOULD LIKE AN UPDATE

## 2023-12-22 NOTE — ED Triage Notes (Signed)
 Pt reports to [facility] after nightshift nurse reports hypotension and decreased responsiveness. Pt had dialysis today, dayshift nurse reported nothing was wrong when pt came back from dialysis. Pt follows commands when asked. Pt will respond what when his name is said but will not respond to questions asked by staff.

## 2023-12-22 NOTE — ED Provider Notes (Signed)
 [place] EMERGENCY DEPARTMENT AT [facility] Provider Note  CSN: [id number] Arrival date & time: 12/22/23 2052  Chief Complaint(s) Hypotension  HPI Christopher Davila is a 53 y.o. male with past medical history as below, significant for ESRD Monday Wednesday Friday, diastolic heart failure, gastroparesis, hypertension who presents to the ED with complaint of hypotension  Presents from rehab facility, with nighttime nurse was checked on the patient she noted he was hypotensive and was more lethargic than typical.  Last HD was this morning per nursing facility.  Patient is a very poor historian.  Patient denies discomfort, no chest pain, no abdominal pain.  Denies vomiting.  Makes little urine.  Past Medical History Past Medical History:  Diagnosis Date   Acute on chronic diastolic CHF (congestive heart failure) (HCC) 09/27/2019   Acute respiratory failure with hypoxia (HCC) 03/26/2021   Acute urinary retention 10/13/2019   Anemia    Blindness 09/04/2019   CHF (congestive heart failure) (HCC)    COVID-19    Diabetes mellitus    Diarrhea 10/14/2019   Epigastric abdominal pain    ESRD (end stage renal disease) (HCC)    Gastroparesis due to DM (HCC)    Hypertension    Hypertensive urgency 04/17/2019   Hypervolemia    Hypoalbuminemia 04/17/2019   Hypoxia    Metabolic acidosis, increased anion gap 10/13/2019   Multifocal pneumonia 05/06/2021   Neuropathy of lower extremity    Oral candidiasis 06/26/2012   Palliative care encounter    Pancreatitis 06/26/2012   Weight loss 06/26/2012   Patient Active Problem List   Diagnosis Date Noted   Reactive airway disease 12/03/2023   Non-insulin  dependent type 2 diabetes mellitus (HCC) 12/03/2023   Acute on chronic diastolic CHF (congestive heart failure) (HCC) 12/03/2023   Agitation 01/20/2023   MRSA (methicillin resistant Staphylococcus aureus) nasal colonization 12/26/2022   Loculated pleural effusion 12/26/2022   Pneumonia  12/25/2022   Acute metabolic encephalopathy 06/13/2022   Acute encephalopathy 06/12/2022   Pleural effusion, bilateral 06/12/2022   Hyperkalemia 06/12/2022   Chronic respiratory failure with hypoxia (HCC) 06/12/2022   Generalized weakness 11/05/2021   Pain due to onychomycosis of toenails of both feet 07/31/2021   Skin lesion 07/31/2021   Volume overload    History of thoracentesis    Acute respiratory failure with hypoxia (HCC) 03/26/2021   Pressure injury of skin 11/06/2019   ESRD on dialysis [facility])    Heart failure with preserved ejection fraction (HCC)    Goals of care, counseling/discussion    Weakness 10/05/2019   Vision loss of left eye 09/21/2019   Homeless 09/19/2019   Anemia of chronic disease 09/19/2019   Vision loss, left eye 09/19/2019   Acute loss of vision, right 06/16/2019   Alcohol  use    Gastroparesis due to DM (HCC)    Essential hypertension    SOB (shortness of breath)    Hyperlipidemia    Home Medication(s) Prior to Admission medications   Medication Sig Start Date End Date Taking? Authorizing Provider  amLODipine  (NORVASC ) 5 MG tablet Take 5-10 mg by mouth See admin instructions. Take 10 mg by mouth on Sun/Tues/Thurs/Sat and 5 mg on Mon/Wed/Fri 05/10/22   [provider]  aspirin  EC 81 MG tablet Take 1 tablet (81 mg total) by mouth daily. 05/24/15   Ghimire, Donalda HERO, MD  brimonidine -timolol  (COMBIGAN ) 0.2-0.5 % ophthalmic solution Place 1 drop into both eyes in the morning and at bedtime.    [provider]  calcium   acetate (PHOSLO ) 667 MG capsule Take 1 capsule (667 mg total) by mouth 3 (three) times daily with meals. 11/19/19   Odell Celinda Balo, MD  cloNIDine  (CATAPRES ) 0.1 MG tablet Take 0.1 mg by mouth every 12 (twelve) hours as needed (Hypertension).    [provider]  famotidine  (PEPCID ) 20 MG tablet Take 20 mg by mouth at bedtime. 05/10/22   [provider]  gabapentin  (NEURONTIN ) 300 MG capsule Take 300 mg by  mouth daily.    [provider]  guaiFENesin  (MUCINEX ) 600 MG 12 hr tablet Take 1 tablet (600 mg total) by mouth 2 (two) times daily. 12/08/23   Rashid, Farhan, MD  hydrALAZINE  (APRESOLINE ) 25 MG tablet Take 25 mg by mouth 3 (three) times daily.    [provider]  hyoscyamine (ANASPAZ) 0.125 MG TBDP disintergrating tablet Place 0.125 mg under the tongue every 4 (four) hours as needed (excess secretions). Not to exceed 6 doses in 24 hours.    [provider]  latanoprost  (XALATAN ) 0.005 % ophthalmic solution Place 1 drop into the left eye at bedtime. 09/23/19   Vann, Jessica U, DO  losartan  (COZAAR ) 100 MG tablet Take 1 tablet (100 mg total) by mouth daily. 11/19/19   Odell Celinda Balo, MD  Menthol, Topical Analgesic, 5 % GEL Apply 1 application  topically every 8 (eight) hours as needed (for pain).    [provider]  metolazone  (ZAROXOLYN ) 5 MG tablet Take 5 mg by mouth at bedtime. 05/10/22   [provider]  Nutritional Supplement LIQD Take 120 mLs by mouth See admin instructions. Med Pass 2.0 liquid: Drink 120 ml's by mouth twice daily on Sun/Tues/Thurs/Sat    [provider]  Nutritional Supplements (NOVASOURCE RENAL) LIQD Take 237 mLs by mouth every Monday, Wednesday, and Friday.    [provider]  ondansetron  (ZOFRAN ) 4 MG tablet Take 4 mg by mouth every 6 (six) hours as needed for nausea or vomiting.    [provider]  oxyCODONE -acetaminophen  (PERCOCET/ROXICET) 5-325 MG tablet Take 1 tablet by mouth 4 (four) times daily.    [provider]  OXYGEN  Inhale 2 L/min into the lungs continuous.    [provider]  polyethylene glycol (MIRALAX  / GLYCOLAX ) 17 g packet Take 17 g by mouth every 12 (twelve) hours as needed for mild constipation or moderate constipation.    [provider]  tamsulosin  (FLOMAX ) 0.4 MG CAPS capsule Take 0.4 mg by mouth daily.    [provider]  torsemide  (DEMADEX )  100 MG tablet Take 100 mg by mouth 2 (two) times daily.    [provider]                                                                                                                                    Past Surgical History Past Surgical History:  Procedure Laterality Date   A/V SHUNT INTERVENTION N/A 10/21/2023  Procedure: A/V SHUNT INTERVENTION;  Surgeon: Melia Lynwood ORN, MD;  [facility] INVASIVE CV LAB;  Service: Cardiovascular;  Laterality: N/A;  60% baslic vein   AV FISTULA PLACEMENT Right 10/29/2019   Procedure: RIGHT ARM BRACHIOBASILIC ARTERIOVENOUS (AV) FISTULA CREATION;  Surgeon: Oris Krystal FALCON, MD;  Location: [facility] OR;  Service: Vascular;  Laterality: Right;   BASCILIC VEIN TRANSPOSITION Right 01/05/2020   Procedure: RIGHT ARM SECOND STAGE BASCILIC VEIN TRANSPOSITION;  Surgeon: Oris Krystal FALCON, MD;  Location: [facility] OR;  Service: Vascular;  Laterality: Right;   EYE SURGERY     IR FLUORO GUIDE CV LINE RIGHT  10/28/2019   IR US  GUIDE VASC ACCESS RIGHT  10/28/2019   Family History Family History  Problem Relation Age of Onset   Diabetes Mother    Lung disease Mother    Hypertension Mother    Diabetes Maternal Aunt    CAD Maternal Aunt    CAD Cousin     Social History Social History   Tobacco Use   Smoking status: Former    Current packs/day: 0.00    Types: Cigarettes    Quit date: 03/22/2015    Years since quitting: 8.7   Smokeless tobacco: Never  Vaping Use   Vaping status: Never Used  Substance Use Topics   Alcohol  use: Not Currently   Drug use: No   Allergies Morphine   Review of Systems A thorough review of systems was obtained and all systems are negative except as noted in the HPI and PMH.   Physical Exam Vital Signs  I have reviewed the triage vital signs BP 108/85   Pulse (!) 4   Temp (!) 97.5 F (36.4 C) (Oral)   Resp 12   SpO2 97%  Physical Exam Vitals and nursing note reviewed.  Constitutional:      General: He is in acute distress.      Appearance: He is well-developed. He is ill-appearing.  HENT:     Head: Normocephalic and atraumatic.     Right Ear: External ear normal.     Left Ear: External ear normal.     Mouth/Throat:     Mouth: Mucous membranes are moist.  Eyes:     General: No scleral icterus. Cardiovascular:     Rate and Rhythm: Normal rate and regular rhythm.     Pulses: Normal pulses.     Heart sounds: Normal heart sounds.  Pulmonary:     Effort: Pulmonary effort is normal. Tachypnea present. No respiratory distress.     Breath sounds: Normal breath sounds.  Abdominal:     General: Abdomen is flat.     Palpations: Abdomen is soft.     Tenderness: There is no abdominal tenderness.  Musculoskeletal:     Cervical back: No rigidity.     Right lower leg: No edema.     Left lower leg: No edema.  Skin:    General: Skin is warm and dry.     Capillary Refill: Capillary refill takes less than 2 seconds.  Neurological:     Mental Status: He is lethargic.  Psychiatric:        Mood and Affect: Mood normal.        Behavior: Behavior normal.     ED Results and Treatments Labs (all labs ordered are listed, but only abnormal results are displayed) Labs Reviewed  COMPREHENSIVE METABOLIC PANEL WITH GFR - Abnormal; Notable for the following components:      Result Value   Sodium 133 (*)  Potassium 6.8 (*)    Chloride 92 (*)    BUN 23 (*)    Creatinine, Ser 4.47 (*)    AST 234 (*)    ALT 181 (*)    Total Bilirubin 2.4 (*)    GFR, Estimated 15 (*)    Anion gap 18 (*)    All other components within normal limits  CBC WITH DIFFERENTIAL/PLATELET - Abnormal; Notable for the following components:   WBC 13.5 (*)    RBC 3.78 (*)    Hemoglobin 12.3 (*)    MCV 103.2 (*)    RDW 15.6 (*)    Platelets 100 (*)    Neutro Abs 11.8 (*)    Abs Immature Granulocytes 0.09 (*)    All other components within normal limits  PROTIME-INR - Abnormal; Notable for the following components:   Prothrombin Time 20.7 (*)     INR 1.7 (*)    All other components within normal limits  I-STAT CG4 LACTIC ACID, ED - Abnormal; Notable for the following components:   Lactic Acid, Venous 4.9 (*)    All other components within normal limits  CULTURE, BLOOD (ROUTINE X 2)  CULTURE, BLOOD (ROUTINE X 2)  RESP PANEL BY RT-PCR (RSV, FLU A&B, COVID)  RVPGX2  MAGNESIUM   URINALYSIS, W/ REFLEX TO CULTURE (INFECTION SUSPECTED)  HEPATITIS B SURFACE ANTIGEN  HEPATITIS B SURFACE ANTIBODY, QUANTITATIVE  AMMONIA  RAPID URINE DRUG SCREEN, HOSP PERFORMED  I-STAT CG4 LACTIC ACID, ED  I-STAT VENOUS BLOOD GAS, ED  CBG MONITORING, ED                                                                                                                          Radiology DG Chest 2 View Result Date: 12/22/2023 CLINICAL DATA:  Sepsis.  Hypotension. EXAM: CHEST - 2 VIEW COMPARISON:  12/03/2023 FINDINGS: Shallow inspiration. Cardiac enlargement. Probable small bilateral pleural effusions. Bilateral hazy perihilar opacities, likely edema but possibly pneumonia. No change since prior study. No pneumothorax. Mediastinal contours appear intact. Surgical clips in the right axilla. IMPRESSION: Cardiac enlargement with bilateral perihilar edema and small effusions, similar to prior study. Electronically Signed   By: Elsie Gravely M.D.   On: 12/22/2023 21:47    Pertinent labs & imaging results that were available during my care of the patient were reviewed by me and considered in my medical decision making (see MDM for details).  Medications Ordered in ED Medications  sodium zirconium cyclosilicate  (LOKELMA ) packet 10 g (has no administration in time range)  Chlorhexidine  Gluconate Cloth 2 % PADS 6 each (has no administration in time range)  iohexol  (OMNIPAQUE ) 350 MG/ML injection 75 mL (has no administration in time range)  insulin  aspart (novoLOG ) injection 5 Units (5 Units Intravenous Given 12/22/23 2307)    And  dextrose  50 % solution 50 mL (50 mLs  Intravenous Given 12/22/23 2308)  sodium zirconium cyclosilicate  (LOKELMA ) packet 10 g (10 g Oral Given 12/22/23 2308)  sodium chloride  0.9 %  bolus 250 mL (0 mLs Intravenous Stopped 12/22/23 2332)                                                                                                                                     Procedures .Critical Care  Performed by: Elnor Jayson LABOR, DO Authorized by: Elnor Jayson LABOR, DO   Critical care provider statement:    Critical care time (minutes):  59   Critical care time was exclusive of:  Separately billable procedures and treating other patients   Critical care was necessary to treat or prevent imminent or life-threatening deterioration of the following conditions:  Metabolic crisis   Critical care was time spent personally by me on the following activities:  Development of treatment plan with patient or surrogate, discussions with consultants, evaluation of patient's response to treatment, examination of patient, ordering and review of laboratory studies, ordering and review of radiographic studies, ordering and performing treatments and interventions, pulse oximetry, re-evaluation of patient's condition, review of old charts and obtaining history from patient or surrogate   (including critical care time)  Medical Decision Making / ED Course    Medical Decision Making:    Mansfield Dann is a 53 y.o. male with past medical history as below, significant for ESRD Monday Wednesday Friday, diastolic heart failure, gastroparesis, hypertension who presents to the ED with complaint of hypotension. The complaint involves an extensive differential diagnosis and also carries with it a high risk of complications and morbidity.  Serious etiology was considered. Ddx includes but is not limited to: Differential diagnoses for altered mental status includes but is not exclusive to alcohol , illicit or prescription medications, intracranial pathology such as stroke,  intracerebral hemorrhage, fever or infectious causes including sepsis, hypoxemia, uremia, trauma, endocrine related disorders such as diabetes, hypoglycemia, thyroid -related diseases, etc.   Complete initial physical exam performed, notably the patient was in mild respiratory distress, hypoxic on room air, improved with supplemental oxygen .    Reviewed and confirmed nursing documentation for past medical history, family history, social history.  Vital signs reviewed.    Hypotension > -HD this AM -appears dry on exam -BP improved prior to fluids, will give 500ml bolus as appears dry, hyperK  AMS> -likely multifactorial in setting of hypotension and SIRS -check ammonia, uds, infectious w/u -CTH  Hyperkalemia> -no EKG changes -HD this am per facility -give lokelma , fluids, dextrose , insulin , cardiac monitoring -spoke with renal, recommend continue lokelma , no HD beds tonight, hopefully can get HD tomorrow  Hypoxia> -was on 5-6L [place] w/ exertion at time of discharge last month, does not typically require O2 at rest -80% on RA here, improved w/ 4LNC\ -non-compliant w/ cpap  SIRS> -WBC 13.5, LA 4.9, no fever, no source identified at this time -send cultures, recheck lactic after fluids -get CT chest/abd pelvis  Clinical Course as of 12/22/23 2351  Mon Dec 22, 2023  2213 BP improving  [SG]  2240 Potassium(!!): 6.8 No EKG changes [SG]  2250 BP: 112/82 Bp improved w/o intervention  [SG]  2259 CXR with some fluid, c/w hx ESRD [SG]  2306 Spoke w/ renal Dr Rayburn, likely HD in the AM, recommend lokelma  TID [SG]    Clinical Course User Index [SG] Elnor Jayson LABOR, DO     Handoff to Dr Trine pending remainder of workup and eventual admission               Additional history obtained: -Additional history obtained from na -External records from outside source obtained and reviewed including: Chart review including previous notes, labs, imaging, consultation notes  including  Prior admit Home meds Prior labs   Lab Tests: -I ordered, reviewed, and interpreted labs.   The pertinent results include:   Labs Reviewed  COMPREHENSIVE METABOLIC PANEL WITH GFR - Abnormal; Notable for the following components:      Result Value   Sodium 133 (*)    Potassium 6.8 (*)    Chloride 92 (*)    BUN 23 (*)    Creatinine, Ser 4.47 (*)    AST 234 (*)    ALT 181 (*)    Total Bilirubin 2.4 (*)    GFR, Estimated 15 (*)    Anion gap 18 (*)    All other components within normal limits  CBC WITH DIFFERENTIAL/PLATELET - Abnormal; Notable for the following components:   WBC 13.5 (*)    RBC 3.78 (*)    Hemoglobin 12.3 (*)    MCV 103.2 (*)    RDW 15.6 (*)    Platelets 100 (*)    Neutro Abs 11.8 (*)    Abs Immature Granulocytes 0.09 (*)    All other components within normal limits  PROTIME-INR - Abnormal; Notable for the following components:   Prothrombin Time 20.7 (*)    INR 1.7 (*)    All other components within normal limits  I-STAT CG4 LACTIC ACID, ED - Abnormal; Notable for the following components:   Lactic Acid, Venous 4.9 (*)    All other components within normal limits  CULTURE, BLOOD (ROUTINE X 2)  CULTURE, BLOOD (ROUTINE X 2)  RESP PANEL BY RT-PCR (RSV, FLU A&B, COVID)  RVPGX2  MAGNESIUM   URINALYSIS, W/ REFLEX TO CULTURE (INFECTION SUSPECTED)  HEPATITIS B SURFACE ANTIGEN  HEPATITIS B SURFACE ANTIBODY, QUANTITATIVE  AMMONIA  RAPID URINE DRUG SCREEN, HOSP PERFORMED  I-STAT CG4 LACTIC ACID, ED  I-STAT VENOUS BLOOD GAS, ED  CBG MONITORING, ED    Notable for as above  EKG   EKG Interpretation Date/Time:  Monday December 22 2023 20:53:02 EDT Ventricular Rate:  79 PR Interval:  202 QRS Duration:  102 QT Interval:  449 QTC Calculation: 515 R Axis:   146  Text Interpretation: Sinus rhythm Borderline prolonged PR interval Low voltage, precordial leads Probable right ventricular hypertrophy Prolonged QT interval Confirmed by Elnor Jayson ([id number]) on  12/22/2023 9:57:29 PM         Imaging Studies ordered: I ordered imaging studies including CTH, CT CAP, CXR >> CT Pending  I independently visualized the following imaging with scope of interpretation limited to determining acute life threatening conditions related to emergency care; findings noted above I agree with the radiologist interpretation If any imaging was obtained with contrast I closely monitored patient for any possible adverse reaction a/w contrast administration in the emergency department   Medicines ordered and prescription drug management: Meds ordered this encounter  Medications   DISCONTD: sodium chloride   0.9 % bolus 1,000 mL   AND Linked Order Group    insulin  aspart (novoLOG ) injection 5 Units    dextrose  50 % solution 50 mL   sodium zirconium cyclosilicate  (LOKELMA ) packet 10 g   sodium chloride  0.9 % bolus 250 mL   sodium zirconium cyclosilicate  (LOKELMA ) packet 10 g   Chlorhexidine  Gluconate Cloth 2 % PADS 6 each   iohexol  (OMNIPAQUE ) 350 MG/ML injection 75 mL    -I have reviewed the patients home medicines and have made adjustments as needed   Consultations Obtained: I requested consultation with the renal,  and discussed lab and imaging findings as well as pertinent plan - they recommend: HD tomorrow   Cardiac Monitoring: The patient was maintained on a cardiac monitor.  I personally viewed and interpreted the cardiac monitored which showed an underlying rhythm of: nsr Continuous pulse oximetry interpreted by myself, 80% on RA.    Social Determinants of Health:  Diagnosis or treatment significantly limited by social determinants of health: former smoker   Reevaluation: After the interventions noted above, I reevaluated the patient and found that they have improved  Co morbidities that complicate the patient evaluation  Past Medical History:  Diagnosis Date   Acute on chronic diastolic CHF (congestive heart failure) (HCC) 09/27/2019   Acute  respiratory failure with hypoxia (HCC) 03/26/2021   Acute urinary retention 10/13/2019   Anemia    Blindness 09/04/2019   CHF (congestive heart failure) (HCC)    COVID-19    Diabetes mellitus    Diarrhea 10/14/2019   Epigastric abdominal pain    ESRD (end stage renal disease) (HCC)    Gastroparesis due to DM (HCC)    Hypertension    Hypertensive urgency 04/17/2019   Hypervolemia    Hypoalbuminemia 04/17/2019   Hypoxia    Metabolic acidosis, increased anion gap 10/13/2019   Multifocal pneumonia 05/06/2021   Neuropathy of lower extremity    Oral candidiasis 06/26/2012   Palliative care encounter    Pancreatitis 06/26/2012   Weight loss 06/26/2012      Dispostion: Disposition decision including need for hospitalization was considered, and patient disposition pending at time of sign out.    Final Clinical Impression(s) / ED Diagnoses Final diagnoses:  Hyperkalemia  ESRD on hemodialysis (HCC)        Elnor Jayson LABOR, DO 12/22/23 2351

## 2023-12-23 ENCOUNTER — Inpatient Hospital Stay (HOSPITAL_COMMUNITY)

## 2023-12-23 DIAGNOSIS — N2581 Secondary hyperparathyroidism of renal origin: Secondary | ICD-10-CM | POA: Diagnosis present

## 2023-12-23 DIAGNOSIS — I132 Hypertensive heart and chronic kidney disease with heart failure and with stage 5 chronic kidney disease, or end stage renal disease: Secondary | ICD-10-CM | POA: Diagnosis present

## 2023-12-23 DIAGNOSIS — N186 End stage renal disease: Secondary | ICD-10-CM

## 2023-12-23 DIAGNOSIS — J9620 Acute and chronic respiratory failure, unspecified whether with hypoxia or hypercapnia: Secondary | ICD-10-CM | POA: Diagnosis not present

## 2023-12-23 DIAGNOSIS — R188 Other ascites: Secondary | ICD-10-CM | POA: Diagnosis present

## 2023-12-23 DIAGNOSIS — A419 Sepsis, unspecified organism: Secondary | ICD-10-CM | POA: Diagnosis present

## 2023-12-23 DIAGNOSIS — I2729 Other secondary pulmonary hypertension: Secondary | ICD-10-CM | POA: Diagnosis present

## 2023-12-23 DIAGNOSIS — J45909 Unspecified asthma, uncomplicated: Secondary | ICD-10-CM | POA: Diagnosis present

## 2023-12-23 DIAGNOSIS — E875 Hyperkalemia: Secondary | ICD-10-CM

## 2023-12-23 DIAGNOSIS — R6521 Severe sepsis with septic shock: Secondary | ICD-10-CM

## 2023-12-23 DIAGNOSIS — I5033 Acute on chronic diastolic (congestive) heart failure: Secondary | ICD-10-CM | POA: Diagnosis present

## 2023-12-23 DIAGNOSIS — R579 Shock, unspecified: Secondary | ICD-10-CM

## 2023-12-23 DIAGNOSIS — E1122 Type 2 diabetes mellitus with diabetic chronic kidney disease: Secondary | ICD-10-CM | POA: Diagnosis present

## 2023-12-23 DIAGNOSIS — I272 Pulmonary hypertension, unspecified: Secondary | ICD-10-CM

## 2023-12-23 DIAGNOSIS — J9601 Acute respiratory failure with hypoxia: Secondary | ICD-10-CM | POA: Diagnosis not present

## 2023-12-23 DIAGNOSIS — D509 Iron deficiency anemia, unspecified: Secondary | ICD-10-CM

## 2023-12-23 DIAGNOSIS — G9340 Encephalopathy, unspecified: Secondary | ICD-10-CM | POA: Diagnosis present

## 2023-12-23 DIAGNOSIS — J810 Acute pulmonary edema: Secondary | ICD-10-CM | POA: Diagnosis not present

## 2023-12-23 DIAGNOSIS — E11649 Type 2 diabetes mellitus with hypoglycemia without coma: Secondary | ICD-10-CM

## 2023-12-23 DIAGNOSIS — Z992 Dependence on renal dialysis: Secondary | ICD-10-CM | POA: Diagnosis not present

## 2023-12-23 DIAGNOSIS — E872 Acidosis, unspecified: Secondary | ICD-10-CM | POA: Diagnosis present

## 2023-12-23 DIAGNOSIS — I4901 Ventricular fibrillation: Secondary | ICD-10-CM | POA: Diagnosis not present

## 2023-12-23 DIAGNOSIS — E1165 Type 2 diabetes mellitus with hyperglycemia: Secondary | ICD-10-CM | POA: Diagnosis present

## 2023-12-23 DIAGNOSIS — R7401 Elevation of levels of liver transaminase levels: Secondary | ICD-10-CM

## 2023-12-23 DIAGNOSIS — I469 Cardiac arrest, cause unspecified: Secondary | ICD-10-CM | POA: Diagnosis present

## 2023-12-23 DIAGNOSIS — J9621 Acute and chronic respiratory failure with hypoxia: Secondary | ICD-10-CM | POA: Diagnosis present

## 2023-12-23 DIAGNOSIS — K7200 Acute and subacute hepatic failure without coma: Secondary | ICD-10-CM | POA: Diagnosis present

## 2023-12-23 DIAGNOSIS — E8729 Other acidosis: Secondary | ICD-10-CM | POA: Diagnosis not present

## 2023-12-23 DIAGNOSIS — R570 Cardiogenic shock: Secondary | ICD-10-CM | POA: Diagnosis present

## 2023-12-23 DIAGNOSIS — J189 Pneumonia, unspecified organism: Secondary | ICD-10-CM | POA: Diagnosis present

## 2023-12-23 DIAGNOSIS — K7682 Hepatic encephalopathy: Secondary | ICD-10-CM | POA: Diagnosis present

## 2023-12-23 DIAGNOSIS — G9341 Metabolic encephalopathy: Secondary | ICD-10-CM | POA: Diagnosis present

## 2023-12-23 DIAGNOSIS — D689 Coagulation defect, unspecified: Secondary | ICD-10-CM | POA: Diagnosis present

## 2023-12-23 DIAGNOSIS — Z8616 Personal history of COVID-19: Secondary | ICD-10-CM | POA: Diagnosis not present

## 2023-12-23 DIAGNOSIS — Z95828 Presence of other vascular implants and grafts: Secondary | ICD-10-CM | POA: Diagnosis not present

## 2023-12-23 LAB — I-STAT VENOUS BLOOD GAS, ED
Acid-Base Excess: 2 mmol/L (ref 0.0–2.0)
Acid-base deficit: 1 mmol/L (ref 0.0–2.0)
Bicarbonate: 26.2 mmol/L (ref 20.0–28.0)
Bicarbonate: 23 mmol/L (ref 20.0–28.0)
Calcium, Ion: 0.99 mmol/L — ABNORMAL LOW (ref 1.15–1.40)
Calcium, Ion: 0.93 mmol/L — ABNORMAL LOW (ref 1.15–1.40)
HCT: 37 % — ABNORMAL LOW (ref 39.0–52.0)
HCT: 41 % (ref 39.0–52.0)
Hemoglobin: 12.6 g/dL — ABNORMAL LOW (ref 13.0–17.0)
Hemoglobin: 13.9 g/dL (ref 13.0–17.0)
O2 Saturation: 96 %
O2 Saturation: 74 %
Potassium: 4.7 mmol/L (ref 3.5–5.1)
Potassium: 5.4 mmol/L — ABNORMAL HIGH (ref 3.5–5.1)
Sodium: 135 mmol/L (ref 135–145)
Sodium: 135 mmol/L (ref 135–145)
TCO2: 27 mmol/L (ref 22–32)
TCO2: 24 mmol/L (ref 22–32)
pCO2, Ven: 39.3 mmHg — ABNORMAL LOW (ref 44–60)
pCO2, Ven: 37.1 mmHg — ABNORMAL LOW (ref 44–60)
pH, Ven: 7.432 — ABNORMAL HIGH (ref 7.25–7.43)
pH, Ven: 7.401 (ref 7.25–7.43)
pO2, Ven: 80 mmHg — ABNORMAL HIGH (ref 32–45)
pO2, Ven: 39 mmHg (ref 32–45)

## 2023-12-23 LAB — HEPATIC FUNCTION PANEL
ALT: 149 U/L — ABNORMAL HIGH (ref 0–44)
AST: 160 U/L — ABNORMAL HIGH (ref 15–41)
Albumin: 3 g/dL — ABNORMAL LOW (ref 3.5–5.0)
Alkaline Phosphatase: 78 U/L (ref 38–126)
Bilirubin, Direct: 0.5 mg/dL — ABNORMAL HIGH (ref 0.0–0.2)
Indirect Bilirubin: 1.2 mg/dL — ABNORMAL HIGH (ref 0.3–0.9)
Total Bilirubin: 1.7 mg/dL — ABNORMAL HIGH (ref 0.0–1.2)
Total Protein: 6.6 g/dL (ref 6.5–8.1)

## 2023-12-23 LAB — CBC
HCT: 39.7 % (ref 39.0–52.0)
Hemoglobin: 12.4 g/dL — ABNORMAL LOW (ref 13.0–17.0)
MCH: 32.5 pg (ref 26.0–34.0)
MCHC: 31.2 g/dL (ref 30.0–36.0)
MCV: 104.2 fL — ABNORMAL HIGH (ref 80.0–100.0)
Platelets: 122 K/uL — ABNORMAL LOW (ref 150–400)
RBC: 3.81 MIL/uL — ABNORMAL LOW (ref 4.22–5.81)
RDW: 16.1 % — ABNORMAL HIGH (ref 11.5–15.5)
WBC: 11.9 K/uL — ABNORMAL HIGH (ref 4.0–10.5)
nRBC: 0.2 % (ref 0.0–0.2)

## 2023-12-23 LAB — ECHOCARDIOGRAM LIMITED
Area-P 1/2: 5.13 cm2
Height: 65 in
S' Lateral: 2 cm
Weight: 2116.42 [oz_av]

## 2023-12-23 LAB — CBG MONITORING, ED
Glucose-Capillary: 176 mg/dL — ABNORMAL HIGH (ref 70–99)
Glucose-Capillary: 58 mg/dL — ABNORMAL LOW (ref 70–99)
Glucose-Capillary: 220 mg/dL — ABNORMAL HIGH (ref 70–99)

## 2023-12-23 LAB — BASIC METABOLIC PANEL WITH GFR
Anion gap: 20 — ABNORMAL HIGH (ref 5–15)
BUN: 29 mg/dL — ABNORMAL HIGH (ref 6–20)
CO2: 19 mmol/L — ABNORMAL LOW (ref 22–32)
Calcium: 9.8 mg/dL (ref 8.9–10.3)
Chloride: 97 mmol/L — ABNORMAL LOW (ref 98–111)
Creatinine, Ser: 4.7 mg/dL — ABNORMAL HIGH (ref 0.61–1.24)
GFR, Estimated: 14 mL/min — ABNORMAL LOW (ref >60)
Glucose, Bld: 185 mg/dL — ABNORMAL HIGH (ref 70–99)
Potassium: 5.3 mmol/L — ABNORMAL HIGH (ref 3.5–5.1)
Sodium: 136 mmol/L (ref 135–145)

## 2023-12-23 LAB — GLUCOSE, CAPILLARY
Glucose-Capillary: 131 mg/dL — ABNORMAL HIGH (ref 70–99)
Glucose-Capillary: 97 mg/dL (ref 70–99)
Glucose-Capillary: 85 mg/dL (ref 70–99)
Glucose-Capillary: 66 mg/dL — ABNORMAL LOW (ref 70–99)
Glucose-Capillary: 103 mg/dL — ABNORMAL HIGH (ref 70–99)

## 2023-12-23 LAB — I-STAT CG4 LACTIC ACID, ED
Lactic Acid, Venous: 3.6 mmol/L (ref 0.5–1.9)
Lactic Acid, Venous: 6.3 mmol/L (ref 0.5–1.9)

## 2023-12-23 LAB — TROPONIN I (HIGH SENSITIVITY)
Troponin I (High Sensitivity): 188 ng/L (ref <18)
Troponin I (High Sensitivity): 396 ng/L (ref <18)

## 2023-12-23 LAB — POTASSIUM
Potassium: 4.5 mmol/L (ref 3.5–5.1)
Potassium: 6.1 mmol/L — ABNORMAL HIGH (ref 3.5–5.1)
Potassium: 4.5 mmol/L (ref 3.5–5.1)

## 2023-12-23 LAB — PHOSPHORUS: Phosphorus: 5 mg/dL — ABNORMAL HIGH (ref 2.5–4.6)

## 2023-12-23 LAB — HEPATITIS B SURFACE ANTIGEN: Hepatitis B Surface Ag: NONREACTIVE

## 2023-12-23 LAB — MRSA NEXT GEN BY PCR, NASAL: MRSA by PCR Next Gen: DETECTED — AB

## 2023-12-23 LAB — AMMONIA: Ammonia: 63 umol/L — ABNORMAL HIGH (ref 9–35)

## 2023-12-23 LAB — MAGNESIUM: Magnesium: 2.2 mg/dL (ref 1.7–2.4)

## 2023-12-23 LAB — TSH: TSH: 1.859 u[IU]/mL (ref 0.350–4.500)

## 2023-12-23 LAB — LACTIC ACID, PLASMA: Lactic Acid, Venous: 7.1 mmol/L (ref 0.5–1.9)

## 2023-12-23 MED ORDER — DEXTROSE 50 % IV SOLN
12.5000 g | Freq: Once | INTRAVENOUS | Status: AC
  Administered 2023-12-23: 12.5 g via INTRAVENOUS
  Filled 2023-12-23: qty 50

## 2023-12-23 MED ORDER — CALCIUM GLUCONATE 10 % IV SOLN
1.0000 g | INTRAVENOUS | Status: AC
  Administered 2023-12-23: 1 g via INTRAVENOUS
  Filled 2023-12-23: qty 10

## 2023-12-23 MED ORDER — LACTULOSE 10 GM/15ML PO SOLN
30.0000 g | Freq: Three times a day (TID) | ORAL | Status: DC
  Administered 2023-12-23 (×2): 30 g via ORAL
  Filled 2023-12-23 (×2): qty 45

## 2023-12-23 MED ORDER — SODIUM CHLORIDE 0.9 % IV SOLN
1.0000 g | INTRAVENOUS | Status: DC
  Filled 2023-12-23 (×2): qty 10

## 2023-12-23 MED ORDER — DEXTROSE 50 % IV SOLN
1.0000 | Freq: Once | INTRAVENOUS | Status: AC
  Administered 2023-12-23: 50 mL via INTRAVENOUS

## 2023-12-23 MED ORDER — SODIUM CHLORIDE 0.9 % IV SOLN
1.0000 g | Freq: Once | INTRAVENOUS | Status: AC
  Administered 2023-12-23: 1 g via INTRAVENOUS
  Filled 2023-12-23: qty 10

## 2023-12-23 MED ORDER — SODIUM CHLORIDE 0.9 % IV BOLUS
500.0000 mL | Freq: Once | INTRAVENOUS | Status: AC
  Administered 2023-12-23: 500 mL via INTRAVENOUS

## 2023-12-23 MED ORDER — DEXTROSE 50 % IV SOLN
INTRAVENOUS | Status: AC
  Filled 2023-12-23: qty 50

## 2023-12-23 MED ORDER — OXYCODONE-ACETAMINOPHEN 5-325 MG PO TABS
1.0000 | ORAL_TABLET | Freq: Four times a day (QID) | ORAL | Status: DC | PRN
  Administered 2023-12-23 – 2023-12-25 (×5): 1 via ORAL
  Filled 2023-12-23 (×7): qty 1

## 2023-12-23 MED ORDER — METRONIDAZOLE 500 MG/100ML IV SOLN
500.0000 mg | Freq: Two times a day (BID) | INTRAVENOUS | Status: DC
  Administered 2023-12-23 (×2): 500 mg via INTRAVENOUS
  Filled 2023-12-23 (×2): qty 100

## 2023-12-23 MED ORDER — SODIUM CHLORIDE 0.9 % IV SOLN
2.0000 g | Freq: Once | INTRAVENOUS | Status: AC
  Administered 2023-12-23: 2 g via INTRAVENOUS
  Filled 2023-12-23: qty 20

## 2023-12-23 MED ORDER — ALBUTEROL SULFATE (2.5 MG/3ML) 0.083% IN NEBU: 2.5000 mg | INHALATION_SOLUTION | RESPIRATORY_TRACT | Status: DC | PRN

## 2023-12-23 MED ORDER — VANCOMYCIN VARIABLE DOSE PER UNSTABLE RENAL FUNCTION (PHARMACIST DOSING): Status: DC

## 2023-12-23 MED ORDER — DEXTROSE 50 % IV SOLN
1.0000 | Freq: Once | INTRAVENOUS | Status: AC
  Administered 2023-12-23: 50 mL via INTRAVENOUS
  Filled 2023-12-23: qty 50

## 2023-12-23 MED ORDER — SODIUM CHLORIDE 0.9 % IV SOLN
500.0000 mg | Freq: Once | INTRAVENOUS | Status: AC
  Administered 2023-12-23: 500 mg via INTRAVENOUS
  Filled 2023-12-23: qty 5

## 2023-12-23 MED ORDER — SODIUM CHLORIDE 0.9 % IV SOLN: 1.0000 g | INTRAVENOUS | Status: DC

## 2023-12-23 MED ORDER — INSULIN ASPART 100 UNIT/ML IV SOLN
5.0000 [IU] | Freq: Once | INTRAVENOUS | Status: AC
  Administered 2023-12-23: 5 [IU] via INTRAVENOUS

## 2023-12-23 MED ORDER — LACTATED RINGERS IV BOLUS: 500.0000 mL | Freq: Once | INTRAVENOUS | Status: DC

## 2023-12-23 MED ORDER — SODIUM CHLORIDE 0.9 % IV SOLN
1.0000 g | INTRAVENOUS | Status: DC
  Filled 2023-12-23: qty 10

## 2023-12-23 MED ORDER — MUPIROCIN 2 % EX OINT
1.0000 | TOPICAL_OINTMENT | Freq: Two times a day (BID) | CUTANEOUS | Status: AC
  Administered 2023-12-23 – 2023-12-27 (×10): 1 via NASAL
  Filled 2023-12-23 (×3): qty 22

## 2023-12-23 MED ORDER — SODIUM CHLORIDE 0.9 % IV SOLN: 2.0000 g | INTRAVENOUS | Status: DC

## 2023-12-23 MED ORDER — ALBUMIN HUMAN 25 % IV SOLN: 25.0000 g | INTRAVENOUS | Status: DC | PRN

## 2023-12-23 MED ORDER — GABAPENTIN 300 MG PO CAPS
300.0000 mg | ORAL_CAPSULE | Freq: Every day | ORAL | Status: DC
  Administered 2023-12-23 – 2023-12-27 (×5): 300 mg via ORAL
  Filled 2023-12-23 (×5): qty 1

## 2023-12-23 MED ORDER — VANCOMYCIN HCL 1250 MG/250ML IV SOLN
1250.0000 mg | Freq: Once | INTRAVENOUS | Status: AC
  Administered 2023-12-23: 1250 mg via INTRAVENOUS
  Filled 2023-12-23: qty 250

## 2023-12-23 MED ORDER — LACTATED RINGERS IV SOLN: INTRAVENOUS | Status: DC

## 2023-12-23 MED ORDER — MIDODRINE HCL 5 MG PO TABS
5.0000 mg | ORAL_TABLET | Freq: Three times a day (TID) | ORAL | Status: DC
  Administered 2023-12-23 – 2023-12-27 (×9): 5 mg via ORAL
  Filled 2023-12-23 (×10): qty 1

## 2023-12-23 MED ORDER — HEPARIN SODIUM (PORCINE) 5000 UNIT/ML IJ SOLN
5000.0000 [IU] | Freq: Two times a day (BID) | INTRAMUSCULAR | Status: DC
  Administered 2023-12-23 – 2023-12-27 (×10): 5000 [IU] via SUBCUTANEOUS
  Filled 2023-12-23 (×10): qty 1

## 2023-12-23 NOTE — Progress Notes (Addendum)
 Pharmacy Antibiotic Note  Christopher Davila is a 53 y.o. male admitted on 12/22/2023 with somnolence/hypotension.  Pharmacy has been consulted for vancomycin  dosing for sepsis.  -WBC 13.5, ESRD (HD MWF; received almost full tx on 7/7), afebrile, LA 4.9 > 6.3 -Ceftriaxone /azithromycin  IV x1 -Blood cultures collected -CTa: superimposed pneumonia +/- atelectasis, pulmonary edema  Plan: -Ceftriaxone  2g IV every 24 hours -Vancomycin  1250mg  IV x1 -Vancomycin  500mg  IV after every HD session -Follow up inpatient HD schedule  -Follow up signs of clinical improvement, LOT, de-escalation of antibiotics     Temp (24hrs), Avg:97.7 F (36.5 C), Min:97.5 F (36.4 C), Max:97.8 F (36.6 C)  Recent Labs  Lab 12/22/23 2120 12/22/23 2251 12/23/23 0101  WBC 13.5*  --   --   CREATININE 4.47*  --   --   LATICACIDVEN  --  4.9* 3.6*    CrCl cannot be calculated (Unknown ideal weight.).    Allergies  Allergen Reactions   Morphine  Itching    Pt prefers not to be given this drug and Allergic, per [facility]    Antimicrobials this admission: Ceftriaxone  7/8 >>  Vancomycin  7/8 >> Azithro x1   Microbiology results: 7/8 BCx:   Thank you for allowing pharmacy to be a part of this patient's care.  Lynwood Poplar, PharmD, BCPS Clinical Pharmacist 12/23/2023 4:32 AM

## 2023-12-23 NOTE — ED Notes (Signed)
 Report giving to louis, rn

## 2023-12-23 NOTE — Sepsis Progress Note (Signed)
 Elink monitoring for the code sepsis protocol.

## 2023-12-23 NOTE — Consult Note (Signed)
 NAME:  Christopher Davila, MRN:  [id number], DOB:  03/01/1971, LOS: 0 ADMISSION DATE:  12/22/2023, CONSULTATION DATE:  12/23/23 REFERRING MD:  TRH, CHIEF COMPLAINT:  ams   History of Present Illness:  53 yo male presented from his facility with ams and hypotension. Pt reportedly has been compliant with his HD schedule and just had dialysis 7/7. Since dialysis pt was noted to be increasingly somnolent.   Initially pt was admitted with TRH however over the course of his stay in the ED pt BP began trending downward. He was given ivf to assist and his oxygen  requirement increased as well as his lactate. Although it does appear his BP is marginally improved. CCM was asked to consult and bring to ICU for hypotension and anticipation that he may req pressors +/- CRRT.   He was started on empiric abx after imaging revealed ?superimposed pna on top of pulmonary edema. Pt does endorse that he makes urine so perhaps could be etiology of infection. CTH negative for acute findings.   Pt is somnolent but protecting airway at the time of my exam. Initially, he would answer simple questions but then refused and would just mumble. He states he is in pain all over. He denies any sob, n/v/d no cough. On 2L  at baseline. At this time he just endorses he wants his pain medication.   Pertinent  Medical History  ESRD MWF RHF Pulm htn LV diastolic HF T2dm Neuromuscular disorder with fasiculations Htn h/o Gerd Chronic macrocytic anemia   Significant Hospital Events: Including procedures, antibiotic start and stop dates in addition to other pertinent events   Admitted to [facility] with TRH, transferred to ICU 2/2 persistent hypotension and likely need for crrt 7/7  Interim History / Subjective:    Objective    Blood pressure (!) 72/57, pulse 80, temperature (!) 97.5 F (36.4 C), temperature source Oral, resp. rate (!) 28, height 5' 5 (1.651 m), weight 60 kg, SpO2 91%.        Intake/Output Summary (Last 24  hours) at 12/23/2023 0550 Last data filed at 12/23/2023 0402 Gross per 24 hour  Intake 270.83 ml  Output --  Net 270.83 ml   Filed Weights   12/23/23 0408  Weight: 60 kg    Examination: General: somnolent but arousable, laying completely flat in bed HENT: ncat, poor dentition, bilateral eyes have chronic scarring, mmmp Lungs: diminished bilaterally Cardiovascular: tachycardic Abdomen: soft, mildly distended, no rebound or guarding Extremities: + LE edema, no c/c Neuro: arousable, but not following commands GU: deferred  Resolved problem list   Assessment and Plan  Acute metabolic encephalopathy Shock, presumed septic of undetermined source Acute decompensated RH failure Chronic LV diastolic failure Lactic acidosis Esrd MWF Severe pulm hypertension, RVSP 102 Dm2 with hyperglycemia, currently with hypoglycemia Hyperkalemia Acute hypoxic resp failure Transaminitis Elevated ammonia Chronic macrocytic anemia Chronic thrombocytopenia Coagulopathy, 2/2 hepatic congestion with RHF -transfer pt to ICU for closer monitoring -hold on additional IVF, will need dialysis with RHF and clearly volume up today despite his reg dialysis -nephrology to see in am.  -continue to follows, empiric abx -suspect the lactic acidosis is worsening 2/2 hepatic congestion with rhf as well -on 8L [place] at this time when chronically on 2L, titrate as able -treat elevated ammonia with lactulose  -monitor BS  -no acute indication for transfusion.  -temporizing potassium and slotted for dialysis this am. Hopefully BP remains stable and can receive standard HD -hold on repeat echo as pt just had one  11/2023   Best Practice (right click and Reselect all SmartList Selections daily)   Diet/type: Regular consistency (see orders) DVT prophylaxis prophylactic heparin   Pressure ulcer(s): N/A GI prophylaxis: N/A Lines: N/A Foley:  N/A Code Status:  full code Last date of multidisciplinary goals of care  discussion [- -]  Labs   CBC: Recent Labs  Lab 12/22/23 2120 12/23/23 0101 12/23/23 0423 12/23/23 0445  WBC 13.5*  --   --  11.9*  NEUTROABS 11.8*  --   --   --   HGB 12.3* 12.6* 13.9 12.4*  HCT 39.0 37.0* 41.0 39.7  MCV 103.2*  --   --  104.2*  PLT 100*  --   --  122*    Basic Metabolic Panel: Recent Labs  Lab 12/22/23 2120 12/22/23 2241 12/23/23 0101 12/23/23 0423  NA 133*  --  135 135  K 6.8*  --  4.7 5.4*  CL 92*  --   --   --   CO2 23  --   --   --   GLUCOSE 76  --   --   --   BUN 23*  --   --   --   CREATININE 4.47*  --   --   --   CALCIUM  9.0  --   --   --   MG  --  2.2  --   --    GFR: Estimated Creatinine Clearance: 16.6 mL/min (A) (by C-G formula based on SCr of 4.47 mg/dL (H)). Recent Labs  Lab 12/22/23 2120 12/22/23 2251 12/23/23 0101 12/23/23 0425 12/23/23 0445  WBC 13.5*  --   --   --  11.9*  LATICACIDVEN  --  4.9* 3.6* 6.3* 7.1*    Liver Function Tests: Recent Labs  Lab 12/22/23 2120  AST 234*  ALT 181*  ALKPHOS 95  BILITOT 2.4*  PROT 7.6  ALBUMIN  3.5   No results for input(s): LIPASE, AMYLASE in the last 168 hours. Recent Labs  Lab 12/23/23 0049  AMMONIA 63*    ABG    Component Value Date/Time   PHART 7.235 (L) 10/25/2019 1210   PCO2ART 55.1 (H) 10/25/2019 1210   PO2ART 127 (H) 10/25/2019 1210   HCO3 23.0 12/23/2023 0423   TCO2 24 12/23/2023 0423   ACIDBASEDEF 1.0 12/23/2023 0423   O2SAT 74 12/23/2023 0423     Coagulation Profile: Recent Labs  Lab 12/22/23 2241  INR 1.7*    Cardiac Enzymes: No results for input(s): CKTOTAL, CKMB, CKMBINDEX, TROPONINI in the last 168 hours.  HbA1C: Hgb A1c MFr Bld  Date/Time Value Ref Range Status  12/04/2023 01:42 PM 6.5 (H) 4.8 - 5.6 % Final    Comment:    (NOTE) Diagnosis of Diabetes The following HbA1c ranges recommended by the American Diabetes Association (ADA) may be used as an aid in the diagnosis of diabetes mellitus.  Hemoglobin              Suggested A1C NGSP%              Diagnosis  <5.7                   Non Diabetic  5.7-6.4                Pre-Diabetic  >6.4                   Diabetic  <7.0  Glycemic control for                       adults with diabetes.    12/26/2022 08:27 AM 5.9 (H) 4.8 - 5.6 % Final    Comment:    (NOTE)         Prediabetes: 5.7 - 6.4         Diabetes: >6.4         Glycemic control for adults with diabetes: <7.0     CBG: Recent Labs  Lab 12/23/23 0413 12/23/23 0428 12/23/23 0532  GLUCAP 58* 176* 220*    Review of Systems:   As per HPI  Past Medical History:  He,  has a past medical history of Acute on chronic diastolic CHF (congestive heart failure) (HCC) (09/27/2019), Acute respiratory failure with hypoxia (HCC) (03/26/2021), Acute urinary retention (10/13/2019), Anemia, Blindness (09/04/2019), CHF (congestive heart failure) (HCC), COVID-19, Diabetes mellitus, Diarrhea (10/14/2019), Epigastric abdominal pain, ESRD (end stage renal disease) (HCC), Gastroparesis due to DM (HCC), Hypertension, Hypertensive urgency (04/17/2019), Hypervolemia, Hypoalbuminemia (04/17/2019), Hypoxia, Metabolic acidosis, increased anion gap (10/13/2019), Multifocal pneumonia (05/06/2021), Neuropathy of lower extremity, Oral candidiasis (06/26/2012), Palliative care encounter, Pancreatitis (06/26/2012), and Weight loss (06/26/2012).   Surgical History:   Past Surgical History:  Procedure Laterality Date   A/V SHUNT INTERVENTION N/A 10/21/2023   Procedure: A/V SHUNT INTERVENTION;  Surgeon: Melia Lynwood ORN, MD;  Location: [facility] INVASIVE CV LAB;  Service: Cardiovascular;  Laterality: N/A;  60% baslic vein   AV FISTULA PLACEMENT Right 10/29/2019   Procedure: RIGHT ARM BRACHIOBASILIC ARTERIOVENOUS (AV) FISTULA CREATION;  Surgeon: Oris Krystal FALCON, MD;  Location: [facility] OR;  Service: Vascular;  Laterality: Right;   BASCILIC VEIN TRANSPOSITION Right 01/05/2020   Procedure: RIGHT ARM SECOND STAGE BASCILIC VEIN  TRANSPOSITION;  Surgeon: Oris Krystal FALCON, MD;  Location: [facility] OR;  Service: Vascular;  Laterality: Right;   EYE SURGERY     IR FLUORO GUIDE CV LINE RIGHT  10/28/2019   IR US  GUIDE VASC ACCESS RIGHT  10/28/2019     Social History:   reports that he quit smoking about 8 years ago. His smoking use included cigarettes. He has never used smokeless tobacco. He reports that he does not currently use alcohol . He reports that he does not use drugs.   Family History:  His family history includes CAD in his cousin and maternal aunt; Diabetes in his maternal aunt and mother; Hypertension in his mother; Lung disease in his mother.   Allergies Allergies  Allergen Reactions   Morphine  Itching    Pt prefers not to be given this drug and Allergic, per [facility]     Home Medications  Prior to Admission medications   Medication Sig Start Date End Date Taking? Authorizing Provider  amLODipine  (NORVASC ) 5 MG tablet Take 5-10 mg by mouth See admin instructions. Take 10 mg by mouth on Sun/Tues/Thurs/Sat and 5 mg on Mon/Wed/Fri 05/10/22   [provider]  aspirin  EC 81 MG tablet Take 1 tablet (81 mg total) by mouth daily. 05/24/15   Ghimire, Donalda HERO, MD  brimonidine -timolol  (COMBIGAN ) 0.2-0.5 % ophthalmic solution Place 1 drop into both eyes in the morning and at bedtime.    [provider]  calcium  acetate (PHOSLO ) 667 MG capsule Take 1 capsule (667 mg total) by mouth 3 (three) times daily with meals. 11/19/19   Odell Celinda Balo, MD  cloNIDine  (CATAPRES ) 0.1 MG tablet Take 0.1 mg by mouth every 12 (twelve) hours as  needed (Hypertension).    [provider]  famotidine  (PEPCID ) 20 MG tablet Take 20 mg by mouth at bedtime. 05/10/22   [provider]  gabapentin  (NEURONTIN ) 300 MG capsule Take 300 mg by mouth daily.    [provider]  guaiFENesin  (MUCINEX ) 600 MG 12 hr tablet Take 1 tablet (600 mg total) by mouth 2 (two) times daily. 12/08/23   Rashid, Farhan, MD   hydrALAZINE  (APRESOLINE ) 25 MG tablet Take 25 mg by mouth 3 (three) times daily.    [provider]  hyoscyamine (ANASPAZ) 0.125 MG TBDP disintergrating tablet Place 0.125 mg under the tongue every 4 (four) hours as needed (excess secretions). Not to exceed 6 doses in 24 hours.    [provider]  latanoprost  (XALATAN ) 0.005 % ophthalmic solution Place 1 drop into the left eye at bedtime. 09/23/19   Vann, Holton Sidman U, DO  losartan  (COZAAR ) 100 MG tablet Take 1 tablet (100 mg total) by mouth daily. 11/19/19   Odell Celinda Balo, MD  Menthol, Topical Analgesic, 5 % GEL Apply 1 application  topically every 8 (eight) hours as needed (for pain).    [provider]  metolazone  (ZAROXOLYN ) 5 MG tablet Take 5 mg by mouth at bedtime. 05/10/22   [provider]  Nutritional Supplement LIQD Take 120 mLs by mouth See admin instructions. Med Pass 2.0 liquid: Drink 120 ml's by mouth twice daily on Sun/Tues/Thurs/Sat    [provider]  Nutritional Supplements (NOVASOURCE RENAL) LIQD Take 237 mLs by mouth every Monday, Wednesday, and Friday.    [provider]  ondansetron  (ZOFRAN ) 4 MG tablet Take 4 mg by mouth every 6 (six) hours as needed for nausea or vomiting.    [provider]  oxyCODONE -acetaminophen  (PERCOCET/ROXICET) 5-325 MG tablet Take 1 tablet by mouth 4 (four) times daily.    [provider]  OXYGEN  Inhale 2 L/min into the lungs continuous.    [provider]  polyethylene glycol (MIRALAX  / GLYCOLAX ) 17 g packet Take 17 g by mouth every 12 (twelve) hours as needed for mild constipation or moderate constipation.    [provider]  tamsulosin  (FLOMAX ) 0.4 MG CAPS capsule Take 0.4 mg by mouth daily.    [provider]  torsemide  (DEMADEX ) 100 MG tablet Take 100 mg by mouth 2 (two) times daily.    [provider]     Critical care time: 

## 2023-12-23 NOTE — ED Notes (Signed)
 Rosana called from nursing home and rehab [phone], please update

## 2023-12-23 NOTE — TOC Initial Note (Signed)
 Transition of Care [facility]) - Initial/Assessment Note    Patient Details  Name: Christopher Davila MRN: [id number] Date of Birth: 1971-02-06  [facility] CM/SW Contact:    Lauraine FORBES Saa, LCSW Phone Number: 12/23/2023, 9:59 AM  Clinical Narrative:                  9:59 AM Per chart review, patient is from [facility]. SNF admissions confirmed patient was LTC at [facility] and is able to return upon medical readiness. Per chart review, patient has a PCP and insurance. Patient did not have SDOH needs as of June 2025.   Expected Discharge Plan: Long Term Nursing Home Barriers to Discharge: Continued Medical Work up   Patient Goals and CMS Choice            Expected Discharge Plan and Services In-house Referral: Clinical Social Work   Post Acute Care Choice: Skilled Nursing Facility, Nursing Home Living arrangements for the past 2 months: Skilled Nursing Facility                                      Prior Living Arrangements/Services Living arrangements for the past 2 months: Skilled Nursing Facility Lives with:: Facility Resident Patient language and need for interpreter reviewed:: Yes          Care giver support system in place?: Yes (comment)   Criminal Activity/Legal Involvement Pertinent to Current Situation/Hospitalization: No - Comment as needed  Activities of Daily Living      Permission Sought/Granted Permission sought to share information with : Facility Medical sales representative, Family Supports Permission granted to share information with : No (Contact information on chart)  Share Information with NAME: Frederik Louder  Permission granted to share info w AGENCY: [facility]  Permission granted to share info w Relationship: Cousin  Permission granted to share info w Contact Information: [phone]  Emotional Assessment Appearance:: Appears stated age     Orientation: : Oriented to Self, Oriented to Place, Oriented to  Time,  Oriented to Situation Alcohol  / Substance Use: Not Applicable Psych Involvement: No (comment)  Admission diagnosis:  Hyperkalemia [E87.5] Encephalopathy [G93.40] ESRD on hemodialysis (HCC) [N18.6, Z99.2] Sepsis (HCC) [A41.9] Patient Active Problem List   Diagnosis Date Noted   Encephalopathy 12/23/2023   Sepsis (HCC) 12/23/2023   Reactive airway disease 12/03/2023   Non-insulin  dependent type 2 diabetes mellitus (HCC) 12/03/2023   Acute on chronic diastolic CHF (congestive heart failure) (HCC) 12/03/2023   Agitation 01/20/2023   MRSA (methicillin resistant Staphylococcus aureus) nasal colonization 12/26/2022   Loculated pleural effusion 12/26/2022   Pneumonia 12/25/2022   Acute metabolic encephalopathy 06/13/2022   Acute encephalopathy 06/12/2022   Pleural effusion, bilateral 06/12/2022   Hyperkalemia 06/12/2022   Chronic respiratory failure with hypoxia (HCC) 06/12/2022   Generalized weakness 11/05/2021   Pain due to onychomycosis of toenails of both feet 07/31/2021   Skin lesion 07/31/2021   Volume overload    History of thoracentesis    Acute respiratory failure with hypoxia (HCC) 03/26/2021   Pressure injury of skin 11/06/2019   ESRD on hemodialysis (HCC)    Heart failure with preserved ejection fraction (HCC)    Goals of care, counseling/discussion    Weakness 10/05/2019   Vision loss of left eye 09/21/2019   Homeless 09/19/2019   Anemia of chronic disease 09/19/2019   Vision loss, left eye 09/19/2019   Acute loss of  vision, right 06/16/2019   Alcohol  use    Gastroparesis due to DM (HCC)    Essential hypertension    SOB (shortness of breath)    Hyperlipidemia    PCP:  Feliciano Devoria LABOR, MD Pharmacy:   [facility] - [place], [place] - [address] [place] [address] Phone: [phone] Fax: [phone]     Social Drivers of Health (SDOH) Social History: SDOH Screenings   Food Insecurity: No Food Insecurity  (12/04/2023)  Housing: Low Risk  (12/08/2023)  Transportation Needs: No Transportation Needs (12/04/2023)  Utilities: Not At Risk (12/04/2023)  Tobacco Use: Medium Risk (12/03/2023)   SDOH Interventions:     Readmission Risk Interventions    12/05/2023    3:04 PM 01/22/2023    1:23 PM 04/06/2021    1:01 PM  Readmission Risk Prevention Plan  Transportation Screening Complete Complete Complete  PCP or Specialist Appt within 3-5 Days Complete    HRI or Home Care Consult Complete    Social Work Consult for Recovery Care Planning/Counseling Complete    Palliative Care Screening Not Applicable    Medication Review Oceanographer) Complete Complete Complete  PCP or Specialist appointment within 3-5 days of discharge  Complete Complete  HRI or Home Care Consult   Complete  SW Recovery Care/Counseling Consult  Complete Complete  Palliative Care Screening   Not Applicable  Skilled Nursing Facility  Complete Complete

## 2023-12-23 NOTE — ED Notes (Signed)
 Date and time results received: 12/23/23 0604 (use smartphrase .now to insert current time)  Test: troponin Critical Value: 188  Name of Provider Notified: DOROTHA Dawson, MD

## 2023-12-23 NOTE — H&P (Signed)
 History and Physical    Christopher Davila DOB: 11-12-1970 DOA: 12/22/2023  PCP: Feliciano Devoria LABOR, MD   Patient coming from: SNF    Chief Complaint:  Chief Complaint  Patient presents with   Hypotension    HPI: History limited due to altered mental status, acuity  Christopher Davila is a 53 y.o. male with hx of ESRD on MWF HD, RV failure with severe pulm hypertension by echo, LV diastolic failure, diabetes type 2, reactive airway disease, ? Neuromuscular disorder with chronic fasciculations, who was brought in from SNF due to altered mental status and hypotension.  Underwent HD earlier today.  Noted to have increased somnolence and hypotension.   On my evaluation patient was acutely ill appearing, see exam below. patient is unable to provide significant history. C/o pain but does not localize, chronic pain man. He is oriented to place but otherwise disoriented. Has no other outward c/o. Denies chest pain.   Review of Systems:  ROS unable to complete d/t mental status, acuity   Allergies  Allergen Reactions   Morphine  Itching    Pt prefers not to be given this drug and Allergic, per [facility]    Prior to Admission medications   Medication Sig Start Date End Date Taking? Authorizing Provider  amLODipine  (NORVASC ) 5 MG tablet Take 5-10 mg by mouth See admin instructions. Take 10 mg by mouth on Sun/Tues/Thurs/Sat and 5 mg on Mon/Wed/Fri 05/10/22   [provider]  aspirin  EC 81 MG tablet Take 1 tablet (81 mg total) by mouth daily. 05/24/15   Ghimire, Donalda HERO, MD  brimonidine -timolol  (COMBIGAN ) 0.2-0.5 % ophthalmic solution Place 1 drop into both eyes in the morning and at bedtime.    [provider]  calcium  acetate (PHOSLO ) 667 MG capsule Take 1 capsule (667 mg total) by mouth 3 (three) times daily with meals. 11/19/19   Odell Celinda Balo, MD  cloNIDine  (CATAPRES ) 0.1 MG tablet Take 0.1 mg by mouth every 12 (twelve) hours as needed (Hypertension).     [provider]  famotidine  (PEPCID ) 20 MG tablet Take 20 mg by mouth at bedtime. 05/10/22   [provider]  gabapentin  (NEURONTIN ) 300 MG capsule Take 300 mg by mouth daily.    [provider]  guaiFENesin  (MUCINEX ) 600 MG 12 hr tablet Take 1 tablet (600 mg total) by mouth 2 (two) times daily. 12/08/23   Rashid, Farhan, MD  hydrALAZINE  (APRESOLINE ) 25 MG tablet Take 25 mg by mouth 3 (three) times daily.    [provider]  hyoscyamine (ANASPAZ) 0.125 MG TBDP disintergrating tablet Place 0.125 mg under the tongue every 4 (four) hours as needed (excess secretions). Not to exceed 6 doses in 24 hours.    [provider]  latanoprost  (XALATAN ) 0.005 % ophthalmic solution Place 1 drop into the left eye at bedtime. 09/23/19   Vann, Jessica U, DO  losartan  (COZAAR ) 100 MG tablet Take 1 tablet (100 mg total) by mouth daily. 11/19/19   Odell Celinda Balo, MD  Menthol, Topical Analgesic, 5 % GEL Apply 1 application  topically every 8 (eight) hours as needed (for pain).    [provider]  metolazone  (ZAROXOLYN ) 5 MG tablet Take 5 mg by mouth at bedtime. 05/10/22   [provider]  Nutritional Supplement LIQD Take 120 mLs by mouth See admin instructions. Med Pass 2.0 liquid: Drink 120 ml's by mouth twice daily on Sun/Tues/Thurs/Sat    [provider]  Nutritional Supplements (NOVASOURCE RENAL) LIQD Take  237 mLs by mouth every Monday, Wednesday, and Friday.    [provider]  ondansetron  (ZOFRAN ) 4 MG tablet Take 4 mg by mouth every 6 (six) hours as needed for nausea or vomiting.    [provider]  oxyCODONE -acetaminophen  (PERCOCET/ROXICET) 5-325 MG tablet Take 1 tablet by mouth 4 (four) times daily.    [provider]  OXYGEN  Inhale 2 L/min into the lungs continuous.    [provider]  polyethylene glycol (MIRALAX  / GLYCOLAX ) 17 g packet Take 17 g by mouth every 12 (twelve) hours as needed for mild  constipation or moderate constipation.    [provider]  tamsulosin  (FLOMAX ) 0.4 MG CAPS capsule Take 0.4 mg by mouth daily.    [provider]  torsemide  (DEMADEX ) 100 MG tablet Take 100 mg by mouth 2 (two) times daily.    [provider]    Past Medical History:  Diagnosis Date   Acute on chronic diastolic CHF (congestive heart failure) (HCC) 09/27/2019   Acute respiratory failure with hypoxia (HCC) 03/26/2021   Acute urinary retention 10/13/2019   Anemia    Blindness 09/04/2019   CHF (congestive heart failure) (HCC)    COVID-19    Diabetes mellitus    Diarrhea 10/14/2019   Epigastric abdominal pain    ESRD (end stage renal disease) (HCC)    Gastroparesis due to DM (HCC)    Hypertension    Hypertensive urgency 04/17/2019   Hypervolemia    Hypoalbuminemia 04/17/2019   Hypoxia    Metabolic acidosis, increased anion gap 10/13/2019   Multifocal pneumonia 05/06/2021   Neuropathy of lower extremity    Oral candidiasis 06/26/2012   Palliative care encounter    Pancreatitis 06/26/2012   Weight loss 06/26/2012    Past Surgical History:  Procedure Laterality Date   A/V SHUNT INTERVENTION N/A 10/21/2023   Procedure: A/V SHUNT INTERVENTION;  Surgeon: Melia Lynwood ORN, MD;  Location: [facility] INVASIVE CV LAB;  Service: Cardiovascular;  Laterality: N/A;  60% baslic vein   AV FISTULA PLACEMENT Right 10/29/2019   Procedure: RIGHT ARM BRACHIOBASILIC ARTERIOVENOUS (AV) FISTULA CREATION;  Surgeon: Oris Krystal FALCON, MD;  Location: [facility] OR;  Service: Vascular;  Laterality: Right;   BASCILIC VEIN TRANSPOSITION Right 01/05/2020   Procedure: RIGHT ARM SECOND STAGE BASCILIC VEIN TRANSPOSITION;  Surgeon: Oris Krystal FALCON, MD;  Location: [facility] OR;  Service: Vascular;  Laterality: Right;   EYE SURGERY     IR FLUORO GUIDE CV LINE RIGHT  10/28/2019   IR US  GUIDE VASC ACCESS RIGHT  10/28/2019     reports that he quit smoking about 8 years ago. His smoking use included cigarettes. He has never used  smokeless tobacco. He reports that he does not currently use alcohol . He reports that he does not use drugs.  Family History  Problem Relation Age of Onset   Diabetes Mother    Lung disease Mother    Hypertension Mother    Diabetes Maternal Aunt    CAD Maternal Aunt    CAD Cousin      Physical Exam: Vitals:   12/23/23 0118 12/23/23 0230 12/23/23 0404 12/23/23 0408  BP: (!) 126/93 97/75 (!) 87/59 (!) 72/57  Pulse: 80     Resp: 12 15 (!) 26 (!) 28  Temp: 97.8 F (36.6 C)     TempSrc: Oral     SpO2: 91%  91%   Weight:    60 kg  Height:    5' 5 (1.651 m)  Gen: Somnolent, Acutely ill appearing, diaphoretic, in distress   CV: Tachycardic, regular, normal S1, S2, 1/6 SEM  Resp: Normal WOB, on [place]. Few rales.  Abd: Distended, and firm, Nontender.  MSK: Symmetric, no edema  Skin: No rashes or lesions to exposed skin  Neuro: Somnolent, semi alert when awake. Speech is clear without aphasia. Oriented to self, [place]. Exam limited by ability to follow commands. PERRL, no facial droop. Moving all extremities antigravity  Psych: in distress    Data review:   Labs reviewed, notable for:   Lactate 4.9 -> 3.6 after IV fluid -> 6.3 -> additional 500 cc VBG 7.4/37 Initial K6.8 -> most recent 5.4 on i-STAT ESRD  CBG 53 -> treated with amp of D50 Hb 13   Micro:  Results for orders placed or performed during the hospital encounter of 12/25/22  Blood culture (routine x 2)     Status: None   Collection Time: 12/25/22  8:28 PM   Specimen: BLOOD LEFT FOREARM  Result Value Ref Range Status   Specimen Description BLOOD LEFT FOREARM  Final   Special Requests   Final    BOTTLES DRAWN AEROBIC AND ANAEROBIC Blood Culture adequate volume   Culture   Final    NO GROWTH 5 DAYS Performed at [facility], [address], [place], [place] [address]    Report Status 12/30/2022 FINAL  Final  Blood culture (routine x 2)     Status: None   Collection Time: 12/25/22  9:15 PM    Specimen: BLOOD LEFT WRIST  Result Value Ref Range Status   Specimen Description BLOOD LEFT WRIST  Final   Special Requests   Final    BOTTLES DRAWN AEROBIC AND ANAEROBIC Blood Culture adequate volume   Culture   Final    NO GROWTH 5 DAYS Performed at [facility], [address], [place], [place] [address]    Report Status 12/30/2022 FINAL  Final  Respiratory (~20 pathogens) panel by PCR     Status: None   Collection Time: 12/25/22 10:47 PM   Specimen: Nasopharyngeal Swab; Respiratory  Result Value Ref Range Status   Adenovirus NOT DETECTED NOT DETECTED Final   Coronavirus 229E NOT DETECTED NOT DETECTED Final    Comment: (NOTE) The Coronavirus on the Respiratory Panel, DOES NOT test for the novel  Coronavirus (2019 nCoV)    Coronavirus HKU1 NOT DETECTED NOT DETECTED Final   Coronavirus NL63 NOT DETECTED NOT DETECTED Final   Coronavirus OC43 NOT DETECTED NOT DETECTED Final   Metapneumovirus NOT DETECTED NOT DETECTED Final   Rhinovirus / Enterovirus NOT DETECTED NOT DETECTED Final   Influenza A NOT DETECTED NOT DETECTED Final   Influenza B NOT DETECTED NOT DETECTED Final   Parainfluenza Virus 1 NOT DETECTED NOT DETECTED Final   Parainfluenza Virus 2 NOT DETECTED NOT DETECTED Final   Parainfluenza Virus 3 NOT DETECTED NOT DETECTED Final   Parainfluenza Virus 4 NOT DETECTED NOT DETECTED Final   Respiratory Syncytial Virus NOT DETECTED NOT DETECTED Final   Bordetella pertussis NOT DETECTED NOT DETECTED Final   Bordetella Parapertussis NOT DETECTED NOT DETECTED Final   Chlamydophila pneumoniae NOT DETECTED NOT DETECTED Final   Mycoplasma pneumoniae NOT DETECTED NOT DETECTED Final    Comment: Performed at [facility], [address], [place], [place] [address]  Resp panel by RT-PCR (RSV, Flu A&B, Covid) Anterior Nasal Swab     Status: None   Collection Time: 12/25/22 10:47 PM  Specimen: Anterior Nasal Swab  Result Value Ref Range Status   SARS Coronavirus 2 by RT PCR  NEGATIVE NEGATIVE Final   Influenza A by PCR NEGATIVE NEGATIVE Final   Influenza B by PCR NEGATIVE NEGATIVE Final    Comment: (NOTE) The Xpert Xpress SARS-CoV-2/FLU/RSV plus assay is intended as an aid in the diagnosis of influenza from Nasopharyngeal swab specimens and should not be used as a sole basis for treatment. Nasal washings and aspirates are unacceptable for Xpert Xpress SARS-CoV-2/FLU/RSV testing.  Fact Sheet for Patients: BloggerCourse.com  Fact Sheet for Healthcare Providers: SeriousBroker.it  This test is not yet approved or cleared by the [place]  FDA and has been authorized for detection and/or diagnosis of SARS-CoV-2 by FDA under an Emergency Use Authorization (EUA). This EUA will remain in effect (meaning this test can be used) for the duration of the COVID-19 declaration under Section 564(b)(1) of the Act, 21 U.S.C. section 360bbb-3(b)(1), unless the authorization is terminated or revoked.     Resp Syncytial Virus by PCR NEGATIVE NEGATIVE Final    Comment: (NOTE) Fact Sheet for Patients: BloggerCourse.com  Fact Sheet for Healthcare Providers: SeriousBroker.it  This test is not yet approved or cleared by the [place]  FDA and has been authorized for detection and/or diagnosis of SARS-CoV-2 by FDA under an Emergency Use Authorization (EUA). This EUA will remain in effect (meaning this test can be used) for the duration of the COVID-19 declaration under Section 564(b)(1) of the Act, 21 U.S.C. section 360bbb-3(b)(1), unless the authorization is terminated or revoked.  Performed at [facility], [address] [place], [place] [address]   MRSA Next Gen by PCR, Nasal     Status: Abnormal   Collection Time: 12/26/22  3:27 AM   Specimen: Nasal Mucosa; Nasal Swab  Result Value Ref Range Status   MRSA by PCR Next Gen DETECTED (A) NOT DETECTED Final     Comment: RESULT CALLED TO, READ BACK BY AND VERIFIED WITH: E DOLLAN 12/26/22 @ 0549 BY AB (NOTE) The GeneXpert MRSA Assay (FDA approved for NASAL specimens only), is one component of a comprehensive MRSA colonization surveillance program. It is not intended to diagnose MRSA infection nor to guide or monitor treatment for MRSA infections. Test performance is not FDA approved in patients less than 34 years old. Performed at [facility], [address], [place], [place] [address]     Imaging reviewed:  CT Head Wo Contrast Result Date: 12/23/2023 CLINICAL DATA:  Delirium. EXAM: CT HEAD WITHOUT CONTRAST TECHNIQUE: Contiguous axial images were obtained from the base of the skull through the vertex without intravenous contrast. RADIATION DOSE REDUCTION: This exam was performed according to the departmental dose-optimization program which includes automated exposure control, adjustment of the mA and/or kV according to patient size and/or use of iterative reconstruction technique. COMPARISON:  December 04, 2023 CT head. FINDINGS: Brain: No evidence of acute infarction, hemorrhage, hydrocephalus, extra-axial collection or mass lesion/mass effect. Patchy white matter hypodensities compatible with microvascular ischemic change. Vascular: No hyperdense vessel. Calcific atherosclerosis. No acute fracture. Skull: No acute fracture. Sinuses/Orbits: Mostly clear sinuses. No acute orbital findings. Right pthisis bulbi. Other: No mastoid effusions. IMPRESSION: No evidence of acute intracranial abnormality. Electronically Signed   By: Gilmore GORMAN Molt M.D.   On: 12/23/2023 00:37   CT CHEST ABDOMEN PELVIS W CONTRAST Result Date: 12/23/2023 CLINICAL DATA:  Sepsis.  Altered mental status. EXAM: CT CHEST, ABDOMEN, AND PELVIS WITH CONTRAST TECHNIQUE: Multidetector CT imaging of the chest,  abdomen and pelvis was performed following the standard protocol during bolus administration of intravenous contrast. RADIATION  DOSE REDUCTION: This exam was performed according to the departmental dose-optimization program which includes automated exposure control, adjustment of the mA and/or kV according to patient size and/or use of iterative reconstruction technique. CONTRAST:  75mL OMNIPAQUE  IOHEXOL  350 MG/ML SOLN COMPARISON:  Chest radiograph 12/22/2023. CT chest abdomen and pelvis 05/06/2021 FINDINGS: CT CHEST FINDINGS Cardiovascular: Mild cardiac enlargement with right heart dilatation. Reflux of contrast material into the hepatic veins suggest right heart failure. Small pericardial effusion. Normal caliber thoracic aorta. No dissection. Great vessel origins are patent. Central pulmonary arteries are well opacified. No evidence of significant pulmonary embolus. Likely dialysis graft in right arm. Mediastinum/Nodes: Thyroid  gland is unremarkable. Esophagus is decompressed. Small amount of residual contrast material in the esophagus suggesting dysmotility. Prominent mediastinal lymph nodes. Largest left aortopulmonic window nodes measure up to about 1.3 cm short axis dimension. No change since prior study, likely reactive. Lungs/Pleura: Mild diffuse ground-glass opacities throughout both lungs with patchy perihilar infiltrates and areas of atelectasis or consolidation in the lung bases. Changes likely to represent edema with probable superimposed pneumonia. Small right pleural effusion. Pleural effusions are decreased since prior study. Musculoskeletal: No chest wall mass or suspicious bone lesions identified. CT ABDOMEN PELVIS FINDINGS Hepatobiliary: Cholelithiasis with small stones in the gallbladder. Pericholecystic edema may be due to ascites. No bile duct dilatation. No focal liver lesions. Pancreas: Unremarkable. No pancreatic ductal dilatation or surrounding inflammatory changes. Spleen: Normal in size without focal abnormality. Adrenals/Urinary Tract: Adrenal glands are unremarkable. Kidneys are normal, without renal calculi,  focal lesion, or hydronephrosis. Bladder wall is thickened, possibly due to under distention or cystitis. Correlate with urinalysis. Stomach/Bowel: Stomach, small bowel, and colon are not abnormally distended. No wall thickening or inflammatory changes. Stool fills the colon. Appendix is not identified. Vascular/Lymphatic: Calcification of the aorta. No aneurysm. Prominent lymph nodes throughout the retroperitoneum without pathologic enlargement. Appearances are similar to prior study. Reproductive: Prostate gland is enlarged. Other: Moderate diffuse free fluid throughout the abdomen and pelvis consistent with ascites. Mesenteric edema. No free air. Abdominal wall musculature appears intact. Musculoskeletal: No acute or significant osseous findings. IMPRESSION: 1. Cardiac enlargement with right heart dilatation evidence of right heart failure. 2. Diffuse pulmonary ground-glass opacities consistent with edema. Patchy infiltrates or consolidation demonstrated in the perihilar and basilar region likely representing superimposed pneumonia and/or atelectasis. Small right pleural effusion. 3. Nonspecific mediastinal and retroperitoneal lymphadenopathy, unchanged, likely reactive. Cholelithiasis. 4. Moderate abdominal and pelvic ascites. Pericholecystic edema is likely related to ascites. 5. Bladder wall thickening may be due to under distension or cystitis. Correlate with urinalysis. 6. Aortic atherosclerosis. Electronically Signed   By: Elsie Gravely M.D.   On: 12/23/2023 00:37   DG Chest 2 View Result Date: 12/22/2023 CLINICAL DATA:  Sepsis.  Hypotension. EXAM: CHEST - 2 VIEW COMPARISON:  12/03/2023 FINDINGS: Shallow inspiration. Cardiac enlargement. Probable small bilateral pleural effusions. Bilateral hazy perihilar opacities, likely edema but possibly pneumonia. No change since prior study. No pneumothorax. Mediastinal contours appear intact. Surgical clips in the right axilla. IMPRESSION: Cardiac enlargement  with bilateral perihilar edema and small effusions, similar to prior study. Electronically Signed   By: Elsie Gravely M.D.   On: 12/22/2023 21:47    EKG:  EKG after my evaluation sinus rhythm, LAD, slight ST depression inferolaterally.  Q wave in aVL    ED Course:  On initial ED arrival BP 86/68, treated with 250 cc IVF  with improvement. His sat was 80% requiring 5L O2. Due to finding of possible superimposed pneumonia and sepsis later treated with ceftriaxone , azithromycin . Treated with insulin  and D50, Lokelma  for hyperkalemia.  EDP discussed with nephrology Dr. Rayburn recommending continuing Lokelma  and plan for hemodialysis later this morning.   After admission had acute decompensation with recurrent hypotension to BP 72/57, tachycardia in the 120-130s, RR in high 20s. He was diaphoretic, in extremis. CBG was low at 53, treated with D50. For recurrent hyperkalemia have repeated temporization with insulin  / D50, and given Ca gluconate 1 g IV. His lactate is worsening, we will trial small additional IVF with 500 cc. However, concern with his RV failure that we may worsen his hemodynamics with large volume fluid, also has hypoxic respiratory failure on 5L O2.  Doubt he will be able to tolerate HD, likely will need CRRT in the ICU. I have paged out to Intensivist and awaiting call back   Assessment/Plan:  52 y.o. male with hx ESRD on MWF HD, RV failure with severe pulm hypertension by echo, LV diastolic failure, diabetes type 2, reactive airway disease, ? Neuromuscular disorder with chronic fasciculations, who was brought in from SNF due to altered mental status and hypotension.  Underwent HD earlier today.  Noted to have increased somnolence and hypotension. Course c/b by worsening shock suspect mixed septic and cardiogenic with RV failure.   Shock, worsening, suspect mixed septic and cardiogenic with RV failure  Community-acquired pneumonia, multifocal On initial evaluation hypotensive  86/68, it improved with 250 cc IV fluid.  Worsening vital sign trend hypotension to BP 72/57, tachycardia in the 120-130s, RR in high 20s.  Lactate rising after initial improvement up to 7.1. CT C/A/P with bilateral pulm opacities suspect edema with superimposed pneumonia, there is also moderate ascites, pericholecystic fluid suspect related to ascites.  - Consult to ICU, awaiting callback  - With recurrent hypotension and since he had initial improvement with IV fluids trialed additional 500 cc with close monitoring of lactate although appears lactate worsening.  Have discontinued all fluids. - If recurrent hypotension anticipate he will need vasopressor support and CRRT for volume management for right heart failure - Continue vancomycin , pharmacy to dose. Broaden to Cefepime , Add Flagyl .  - Follow-up blood cultures.  Cath for urinalysis and urine culture.  Flu/COVID/RSV pending - Once he is stabilized will also need diagnostic paracentesis given ascites - Started on midodrine  5 mg 3 times daily to support blood pressure  ESRD on MWF HD Hyperkalemia  Last HD session outpatient on 7/7. Depsite this presented with K 6.8, no other significant electrolyte abnormalities.  Temporized with insulin /D50, and given Lokelma .  Recurrent hyperK to 5.4 this AM  -EDP discussed with nephrology Dr. Rayburn recommending continuing Lokelma  and plan for hemodialysis later this morning; however due to worsening shock think he will need CRRT.  - Repeat temporization with insulin  and D50 - Give calcium  gluconate 1 g IV - Continue Lokelma  10 g TID   RV failure  LV diastolic failure  - Avoid additional fluids.  Midodrine  for BP support.  May need inotropes. - Echo  Encephalopathy, acute metabolic ?  Hepatic encephalopathy related to congestive hepatopathy Somnolent, disoriented to time, situation.  Exam limited but no focal deficits noted.  CT head negative for acute findings.  Suspect etiology related to  underlying infection per above.  Ammonia is elevated at 63.  With his ascites and history of RV failure suspect congestive hepatopathy.  - Management of underlying infection per  above - Start lactulose  3 times daily titrate to 3-4 bowel movements  Acute liver injury, predominantly hepatocellular AST 234, ALT 181, T. bili 2.4 not fractionated.  Alk phos normal.  Suspect related to congestive hepatopathy per above - Trend LFT, management of volume status/RV failure per above.  Hypoglycemia, iatrogenic Developed hypoglycemia after initial temporization with insulin  to low of 58.  -Treated with D50.  CBG every 4 hour -Hypoglycemic protocol  Chronic medical problems: Diabetes type 2: See above, currently dealing with hypoglycemia. Reactive airway disease: Albuterol  prn ?  Neuromuscular disease with chronic fasciculations: Noted per chart.    Body mass index is 22.01 kg/m.    DVT prophylaxis:  SCDs Code Status:  Full Code Diet:  Diet Orders (From admission, onward)     Start     Ordered   12/23/23 0434  Diet NPO time specified Except for: Sips with Meds  Diet effective now       Question:  Except for  Answer:  Noralyn with Meds   12/23/23 0433           Family Communication:  None   Consults:  ICU   Admission status:   Inpatient, Step Down Unit -> Anticipate needs escalation to ICU   Severity of Illness: The appropriate patient status for this patient is INPATIENT. Inpatient status is judged to be reasonable and necessary in order to provide the required intensity of service to ensure the patient's safety. The patient's presenting symptoms, physical exam findings, and initial radiographic and laboratory data in the context of their chronic comorbidities is felt to place them at high risk for further clinical deterioration. Furthermore, it is not anticipated that the patient will be medically stable for discharge from the hospital within 2 midnights of admission.   * I certify that  at the point of admission it is my clinical judgment that the patient will require inpatient hospital care spanning beyond 2 midnights from the point of admission due to high intensity of service, high risk for further deterioration and high frequency of surveillance required.*   Dorn Dawson, MD [facility]  How to contact the [facility] Attending or Consulting provider 7A - 7P or covering provider during after hours 7P -7A, for this patient.  Check the care team in [facility] and look for a) attending/consulting [facility] provider listed and b) the [facility] team listed Log into [email or web address] and use [facility]'s universal password to access. If you do not have the password, please contact the hospital operator. Locate the [facility] provider you are looking for under [facility] and page to a number that you can be directly reached. If you still have difficulty reaching the provider, please page the [facility] (Director on Call) for the Hospitalists listed on amion for assistance.  12/23/2023, 5:01 AM   CRITICAL CARE Performed by: Dorn Dawson   Total critical care time: 45 minutes  Critical care time was exclusive of separately billable procedures and treating other patients.  Critical care was necessary to treat or prevent imminent or life-threatening deterioration.  Critical care was time spent personally by me on the following activities: development of treatment plan with patient and/or surrogate as well as nursing, discussions with consultants, evaluation of patient's response to treatment, examination of patient, obtaining history from patient or surrogate, ordering and performing treatments and interventions, ordering and review of laboratory studies, ordering and review of radiographic studies, pulse oximetry and re-evaluation of patient's condition.

## 2023-12-23 NOTE — Consult Note (Signed)
 [facility] Renal Consultation Note    Indication for Consultation:  Management of ESRD/hemodialysis; anemia, hypertension/volume and secondary hyperparathyroidism   HPI: Christopher Davila is a 53 y.o. male with ESRD on HD MWF, diabetes, hypertension, blindness, chronic hypoxic respiratory failure who presented to the ED yesterday evening from his SNF with hypotension and decreased responsiveness. Code sepsis on arrival. He was hypotensive with SPBs 70s-80s, tachycardia, tachypnea. Chest CT showing pulm edema with superimposed pneumonia. Lactate up to 7.1. Cultures collected, broad spectrum antibiotics started. Received IVF bolus in the ED. Critical care consulted and he was admitted to the ICU for shock.   Notably also  found to have K 6.8 admission. He did have a full session of dialysis prior to arrival. He was given temporizing meds with plans for HD today. There is great concern for recirculation of his AVF   Seen and examined in the ICU. BP improved 120/80. He's alert and oriented. Reports weakness and feeling tired. Denies chest pain, sob, nausea/vomiting.  Past Medical History:  Diagnosis Date   Acute on chronic diastolic CHF (congestive heart failure) (HCC) 09/27/2019   Acute respiratory failure with hypoxia (HCC) 03/26/2021   Acute urinary retention 10/13/2019   Anemia    Blindness 09/04/2019   CHF (congestive heart failure) (HCC)    COVID-19    Diabetes mellitus    Diarrhea 10/14/2019   Epigastric abdominal pain    ESRD (end stage renal disease) (HCC)    Gastroparesis due to DM (HCC)    Hypertension    Hypertensive urgency 04/17/2019   Hypervolemia    Hypoalbuminemia 04/17/2019   Hypoxia    Metabolic acidosis, increased anion gap 10/13/2019   Multifocal pneumonia 05/06/2021   Neuropathy of lower extremity    Oral candidiasis 06/26/2012   Palliative care encounter    Pancreatitis 06/26/2012   Weight loss 06/26/2012   Past Surgical History:  Procedure  Laterality Date   A/V SHUNT INTERVENTION N/A 10/21/2023   Procedure: A/V SHUNT INTERVENTION;  Surgeon: Melia Lynwood ORN, MD;  Location: [facility] INVASIVE CV LAB;  Service: Cardiovascular;  Laterality: N/A;  60% baslic vein   AV FISTULA PLACEMENT Right 10/29/2019   Procedure: RIGHT ARM BRACHIOBASILIC ARTERIOVENOUS (AV) FISTULA CREATION;  Surgeon: Oris Krystal FALCON, MD;  Location: [facility] OR;  Service: Vascular;  Laterality: Right;   BASCILIC VEIN TRANSPOSITION Right 01/05/2020   Procedure: RIGHT ARM SECOND STAGE BASCILIC VEIN TRANSPOSITION;  Surgeon: Oris Krystal FALCON, MD;  Location: [facility] OR;  Service: Vascular;  Laterality: Right;   EYE SURGERY     IR FLUORO GUIDE CV LINE RIGHT  10/28/2019   IR US  GUIDE VASC ACCESS RIGHT  10/28/2019   Family History  Problem Relation Age of Onset   Diabetes Mother    Lung disease Mother    Hypertension Mother    Diabetes Maternal Aunt    CAD Maternal Aunt    CAD Cousin    Social History:  reports that he quit smoking about 8 years ago. His smoking use included cigarettes. He has never used smokeless tobacco. He reports that he does not currently use alcohol . He reports that he does not use drugs. Allergies  Allergen Reactions   Morphine  Itching    Pt prefers not to be given this drug and Allergic, per [facility]   Prior to Admission medications   Medication Sig Start Date End Date Taking? Authorizing Provider  amLODipine  (NORVASC ) 5 MG tablet Take 5-10 mg by mouth See admin instructions. Take 10 mg by  mouth on Sun/Tues/Thurs/Sat and 5 mg on Mon/Wed/Fri 05/10/22   [provider]  aspirin  EC 81 MG tablet Take 1 tablet (81 mg total) by mouth daily. 05/24/15   Ghimire, Donalda HERO, MD  brimonidine -timolol  (COMBIGAN ) 0.2-0.5 % ophthalmic solution Place 1 drop into both eyes in the morning and at bedtime.    [provider]  calcium  acetate (PHOSLO ) 667 MG capsule Take 1 capsule (667 mg total) by mouth 3 (three) times daily with meals. 11/19/19   Odell Celinda Balo, MD   cloNIDine  (CATAPRES ) 0.1 MG tablet Take 0.1 mg by mouth every 12 (twelve) hours as needed (Hypertension).    [provider]  famotidine  (PEPCID ) 20 MG tablet Take 20 mg by mouth at bedtime. 05/10/22   [provider]  gabapentin  (NEURONTIN ) 300 MG capsule Take 300 mg by mouth daily.    [provider]  guaiFENesin  (MUCINEX ) 600 MG 12 hr tablet Take 1 tablet (600 mg total) by mouth 2 (two) times daily. 12/08/23   Rashid, Farhan, MD  hydrALAZINE  (APRESOLINE ) 25 MG tablet Take 25 mg by mouth 3 (three) times daily.    [provider]  hyoscyamine (ANASPAZ) 0.125 MG TBDP disintergrating tablet Place 0.125 mg under the tongue every 4 (four) hours as needed (excess secretions). Not to exceed 6 doses in 24 hours.    [provider]  latanoprost  (XALATAN ) 0.005 % ophthalmic solution Place 1 drop into the left eye at bedtime. 09/23/19   Vann, Jessica U, DO  losartan  (COZAAR ) 100 MG tablet Take 1 tablet (100 mg total) by mouth daily. 11/19/19   Odell Celinda Balo, MD  Menthol, Topical Analgesic, 5 % GEL Apply 1 application  topically every 8 (eight) hours as needed (for pain).    [provider]  metolazone  (ZAROXOLYN ) 5 MG tablet Take 5 mg by mouth at bedtime. 05/10/22   [provider]  Nutritional Supplement LIQD Take 120 mLs by mouth See admin instructions. Med Pass 2.0 liquid: Drink 120 ml's by mouth twice daily on Sun/Tues/Thurs/Sat    [provider]  Nutritional Supplements (NOVASOURCE RENAL) LIQD Take 237 mLs by mouth every Monday, Wednesday, and Friday.    [provider]  ondansetron  (ZOFRAN ) 4 MG tablet Take 4 mg by mouth every 6 (six) hours as needed for nausea or vomiting.    [provider]  oxyCODONE -acetaminophen  (PERCOCET/ROXICET) 5-325 MG tablet Take 1 tablet by mouth 4 (four) times daily.    [provider]  OXYGEN  Inhale 2 L/min into the lungs continuous.    [provider]   polyethylene glycol (MIRALAX  / GLYCOLAX ) 17 g packet Take 17 g by mouth every 12 (twelve) hours as needed for mild constipation or moderate constipation.    [provider]  tamsulosin  (FLOMAX ) 0.4 MG CAPS capsule Take 0.4 mg by mouth daily.    [provider]  torsemide  (DEMADEX ) 100 MG tablet Take 100 mg by mouth 2 (two) times daily.    [provider]   Current Facility-Administered Medications  Medication Dose Route Frequency Provider Last Rate Last Admin   albumin  human 25 % solution 25 g  25 g Intravenous Q1H PRN Geralynn Charleston, MD       albuterol  (PROVENTIL ) (2.5 MG/3ML) 0.083% nebulizer solution 2.5 mg  2.5 mg Nebulization Q4H PRN Segars, Dorn, MD       ceFEPIme  (MAXIPIME ) 1 g in sodium chloride  0.9 % 100 mL IVPB  1 g Intravenous Q24H Olalere, Adewale A, MD  Chlorhexidine  Gluconate Cloth 2 % PADS 6 each  6 each Topical Q0600 Rayburn Pac, MD   6 each at 12/23/23 0800   gabapentin  (NEURONTIN ) capsule 300 mg  300 mg Oral QHS Olalere, Adewale A, MD       heparin  injection 5,000 Units  5,000 Units Subcutaneous Q12H Layman Raisin, DO   5,000 Units at 12/23/23 1501   lactulose  (CHRONULAC ) 10 GM/15ML solution 30 g  30 g Oral TID Segars, Jonathan, MD   30 g at 12/23/23 0441   metroNIDAZOLE  (FLAGYL ) IVPB 500 mg  500 mg Intravenous Q12H Segars, Jonathan, MD   Stopped at 12/23/23 [phone]   midodrine  (PROAMATINE ) tablet 5 mg  5 mg Oral TID WC Segars, Jonathan, MD   5 mg at 12/23/23 0441   mupirocin  ointment (BACTROBAN ) 2 % 1 Application  1 Application Nasal BID Olalere, Adewale A, MD   1 Application at 12/23/23 1501   oxyCODONE -acetaminophen  (PERCOCET/ROXICET) 5-325 MG per tablet 1 tablet  1 tablet Oral Q6H PRN Olalere, Adewale A, MD   1 tablet at 12/23/23 1501   sodium zirconium cyclosilicate  (LOKELMA ) packet 10 g  10 g Oral TID Elnor Savant A, DO       vancomycin  variable dose per unstable renal function (pharmacist dosing)   Does not apply See admin  instructions Laron Agent, RPH         ROS: As per HPI otherwise negative.  Physical Exam: Vitals:   12/23/23 1100 12/23/23 1115 12/23/23 1116 12/23/23 1504  BP: 105/84 116/88    Pulse: 86 84    Resp: 19 18    Temp:   97.8 F (36.6 C) 98.7 F (37.1 C)  TempSrc:   Oral Oral  SpO2: 94% 94%    Weight:      Height:         General: Sitting up in bed, nad, on nasal oxygen   Head: NCAT sclera not icteric MMM Neck: Supple. No JVD appreciated  Lungs: Clear bilaterally. Normal WOB  Heart: RRR, no murmur, rub, or gallop  Abdomen: soft non-tender  Lower extremities:without edema or ischemic changes, no open wounds  Neuro: A & O X 3. Moves all extremities spontaneously. Psych:  Responds to questions appropriately with a normal affect. Dialysis Access: LUE AVF +bruit   Labs: Basic Metabolic Panel: Recent Labs  Lab 12/22/23 2120 12/23/23 0101 12/23/23 0423 12/23/23 0445 12/23/23 1048  NA 133* 135 135 136  --   K 6.8* 4.7 5.4* 5.3* 4.5  CL 92*  --   --  97*  --   CO2 23  --   --  19*  --   GLUCOSE 76  --   --  185*  --   BUN 23*  --   --  29*  --   CREATININE 4.47*  --   --  4.70*  --   CALCIUM  9.0  --   --  9.8  --   PHOS  --   --   --  5.0*  --    Liver Function Tests: Recent Labs  Lab 12/22/23 2120 12/23/23 0445  AST 234* 160*  ALT 181* 149*  ALKPHOS 95 78  BILITOT 2.4* 1.7*  PROT 7.6 6.6  ALBUMIN  3.5 3.0*   No results for input(s): LIPASE, AMYLASE in the last 168 hours. Recent Labs  Lab 12/23/23 0049  AMMONIA 63*   CBC: Recent Labs  Lab 12/22/23 2120 12/23/23 0101 12/23/23 0423 12/23/23 0445  WBC 13.5*  --   --  11.9*  NEUTROABS 11.8*  --   --   --   HGB 12.3* 12.6* 13.9 12.4*  HCT 39.0 37.0* 41.0 39.7  MCV 103.2*  --   --  104.2*  PLT 100*  --   --  122*   Cardiac Enzymes: No results for input(s): CKTOTAL, CKMB, CKMBINDEX, TROPONINI in the last 168 hours. CBG: Recent Labs  Lab 12/23/23 0428 12/23/23 0532 12/23/23 0809  12/23/23 1115 12/23/23 1503  GLUCAP 176* 220* 131* 97 85   Iron Studies: No results for input(s): IRON, TIBC, TRANSFERRIN, FERRITIN in the last 72 hours. Studies/Results: ECHOCARDIOGRAM LIMITED Result Date: 12/23/2023    ECHOCARDIOGRAM LIMITED REPORT   Patient Name:   RAJEEV ESCUE Date of Exam: 12/23/2023 Medical Rec #:  [id number]            Height:       65.0 in Accession #:    [id number]           Weight:       132.3 lb Date of Birth:  05-28-1971            BSA:          1.659 m Patient Age:    52 years             BP:           107/77 mmHg Patient Gender: M                    HR:           85 bpm. Exam Location:  Inpatient Procedure: Limited Echo, Color Doppler and Cardiac Doppler (Both Spectral and            Color Flow Doppler were utilized during procedure). Indications:    congestive heart failure  History:        Patient has prior history of Echocardiogram examinations, most                 recent 12/04/2023. End stage renal disease and Pulmonary HTN,                 Signs/Symptoms:Altered Mental Status; Risk Factors:Hypertension                 and Dyslipidemia.  Sonographer:    Tinnie Barefoot RDCS Referring Phys: [id number] JONATHAN SEGARS IMPRESSIONS  1. Left ventricular ejection fraction, by estimation, is 65 to 70%. The left ventricle has normal function. The left ventricle has no regional wall motion abnormalities. There is mild left ventricular hypertrophy. There is the interventricular septum is  flattened in systole and diastole, consistent with right ventricular pressure and volume overload.  2. Right ventricular systolic function is severely reduced. The right ventricular size is severely enlarged. There is severely elevated pulmonary artery systolic pressure. The estimated right ventricular systolic pressure is 68.5 mmHg.  3. Moderate pericardial effusion. The pericardial effusion is posterior to the left ventricle and surrounding the apex.  4. The mitral valve is normal in  structure. No evidence of mitral valve regurgitation. No evidence of mitral stenosis.  5. 2D Vena Contracta 11 mm, there are multiple jets. Tricuspid valve regurgitation is severe.  6. The aortic valve is tricuspid. There is mild calcification of the aortic valve. Aortic valve regurgitation is not visualized. Aortic valve sclerosis is present, with no evidence of aortic valve stenosis. Comparison(s): Prior images reviewed side by side. RVSP has improved with no other improvements in RV assessment. FINDINGS  Left Ventricle: Left ventricular ejection fraction, by estimation, is 65 to 70%. The left ventricle has normal function. The left ventricle has no regional wall motion abnormalities. The left ventricular internal cavity size was small. There is mild left ventricular hypertrophy. The interventricular septum is flattened in systole and diastole, consistent with right ventricular pressure and volume overload. Right Ventricle: The right ventricular size is severely enlarged. Right ventricular systolic function is severely reduced. There is severely elevated pulmonary artery systolic pressure. The tricuspid regurgitant velocity is 3.89 m/s, and with an assumed right atrial pressure of 8 mmHg, the estimated right ventricular systolic pressure is 68.5 mmHg. Right Atrium: Right atrial size was normal in size. Pericardium: A moderately sized pericardial effusion is present. The pericardial effusion is posterior to the left ventricle and surrounding the apex. Mitral Valve: The mitral valve is normal in structure. No evidence of mitral valve stenosis. Tricuspid Valve: 2D Vena Contracta 11 mm, there are multiple jets. Tricuspid valve regurgitation is severe. Aortic Valve: The aortic valve is tricuspid. There is mild calcification of the aortic valve. There is mild aortic valve annular calcification. Aortic valve regurgitation is not visualized. Aortic valve sclerosis is present, with no evidence of aortic valve stenosis.  Pulmonic Valve: The pulmonic valve was normal in structure. Pulmonic valve regurgitation is mild to moderate. No evidence of pulmonic stenosis. Additional Comments: Spectral Doppler performed. Color Doppler performed.  LEFT VENTRICLE PLAX 2D LVIDd:         3.10 cm Diastology LVIDs:         2.00 cm LV e' medial:    6.31 cm/s LV PW:         1.20 cm LV E/e' medial:  9.1 LV IVS:        1.20 cm LV e' lateral:   5.98 cm/s                        LV E/e' lateral: 9.6  RIGHT VENTRICLE            IVC RV S prime:     7.72 cm/s  IVC diam: 1.90 cm LEFT ATRIUM         Index LA diam:    2.70 cm 1.63 cm/m  AORTIC VALVE LVOT Vmax:   75.50 cm/s LVOT Vmean:  46.100 cm/s LVOT VTI:    0.107 m MITRAL VALVE               TRICUSPID VALVE MV Area (PHT): 5.13 cm    TR Peak grad:   60.5 mmHg MV Decel Time: 148 msec    TR Vmax:        389.00 cm/s MV E velocity: 57.60 cm/s MV A velocity: 80.20 cm/s  SHUNTS MV E/A ratio:  0.72        Systemic VTI: 0.11 m Stanly Leavens MD Electronically signed by Stanly Leavens MD Signature Date/Time: 12/23/2023/2:29:09 PM    Final    CT Head Wo Contrast Result Date: 12/23/2023 CLINICAL DATA:  Delirium. EXAM: CT HEAD WITHOUT CONTRAST TECHNIQUE: Contiguous axial images were obtained from the base of the skull through the vertex without intravenous contrast. RADIATION DOSE REDUCTION: This exam was performed according to the departmental dose-optimization program which includes automated exposure control, adjustment of the mA and/or kV according to patient size and/or use of iterative reconstruction technique. COMPARISON:  December 04, 2023 CT head. FINDINGS: Brain: No evidence of acute infarction, hemorrhage, hydrocephalus, extra-axial collection or mass lesion/mass effect. Patchy white matter  hypodensities compatible with microvascular ischemic change. Vascular: No hyperdense vessel. Calcific atherosclerosis. No acute fracture. Skull: No acute fracture. Sinuses/Orbits: Mostly clear sinuses. No acute  orbital findings. Right pthisis bulbi. Other: No mastoid effusions. IMPRESSION: No evidence of acute intracranial abnormality. Electronically Signed   By: Gilmore GORMAN Molt M.D.   On: 12/23/2023 00:37   CT CHEST ABDOMEN PELVIS W CONTRAST Result Date: 12/23/2023 CLINICAL DATA:  Sepsis.  Altered mental status. EXAM: CT CHEST, ABDOMEN, AND PELVIS WITH CONTRAST TECHNIQUE: Multidetector CT imaging of the chest, abdomen and pelvis was performed following the standard protocol during bolus administration of intravenous contrast. RADIATION DOSE REDUCTION: This exam was performed according to the departmental dose-optimization program which includes automated exposure control, adjustment of the mA and/or kV according to patient size and/or use of iterative reconstruction technique. CONTRAST:  75mL OMNIPAQUE  IOHEXOL  350 MG/ML SOLN COMPARISON:  Chest radiograph 12/22/2023. CT chest abdomen and pelvis 05/06/2021 FINDINGS: CT CHEST FINDINGS Cardiovascular: Mild cardiac enlargement with right heart dilatation. Reflux of contrast material into the hepatic veins suggest right heart failure. Small pericardial effusion. Normal caliber thoracic aorta. No dissection. Great vessel origins are patent. Central pulmonary arteries are well opacified. No evidence of significant pulmonary embolus. Likely dialysis graft in right arm. Mediastinum/Nodes: Thyroid  gland is unremarkable. Esophagus is decompressed. Small amount of residual contrast material in the esophagus suggesting dysmotility. Prominent mediastinal lymph nodes. Largest left aortopulmonic window nodes measure up to about 1.3 cm short axis dimension. No change since prior study, likely reactive. Lungs/Pleura: Mild diffuse ground-glass opacities throughout both lungs with patchy perihilar infiltrates and areas of atelectasis or consolidation in the lung bases. Changes likely to represent edema with probable superimposed pneumonia. Small right pleural effusion. Pleural effusions  are decreased since prior study. Musculoskeletal: No chest wall mass or suspicious bone lesions identified. CT ABDOMEN PELVIS FINDINGS Hepatobiliary: Cholelithiasis with small stones in the gallbladder. Pericholecystic edema may be due to ascites. No bile duct dilatation. No focal liver lesions. Pancreas: Unremarkable. No pancreatic ductal dilatation or surrounding inflammatory changes. Spleen: Normal in size without focal abnormality. Adrenals/Urinary Tract: Adrenal glands are unremarkable. Kidneys are normal, without renal calculi, focal lesion, or hydronephrosis. Bladder wall is thickened, possibly due to under distention or cystitis. Correlate with urinalysis. Stomach/Bowel: Stomach, small bowel, and colon are not abnormally distended. No wall thickening or inflammatory changes. Stool fills the colon. Appendix is not identified. Vascular/Lymphatic: Calcification of the aorta. No aneurysm. Prominent lymph nodes throughout the retroperitoneum without pathologic enlargement. Appearances are similar to prior study. Reproductive: Prostate gland is enlarged. Other: Moderate diffuse free fluid throughout the abdomen and pelvis consistent with ascites. Mesenteric edema. No free air. Abdominal wall musculature appears intact. Musculoskeletal: No acute or significant osseous findings. IMPRESSION: 1. Cardiac enlargement with right heart dilatation evidence of right heart failure. 2. Diffuse pulmonary ground-glass opacities consistent with edema. Patchy infiltrates or consolidation demonstrated in the perihilar and basilar region likely representing superimposed pneumonia and/or atelectasis. Small right pleural effusion. 3. Nonspecific mediastinal and retroperitoneal lymphadenopathy, unchanged, likely reactive. Cholelithiasis. 4. Moderate abdominal and pelvic ascites. Pericholecystic edema is likely related to ascites. 5. Bladder wall thickening may be due to under distension or cystitis. Correlate with urinalysis. 6.  Aortic atherosclerosis. Electronically Signed   By: Elsie Gravely M.D.   On: 12/23/2023 00:37   DG Chest 2 View Result Date: 12/22/2023 CLINICAL DATA:  Sepsis.  Hypotension. EXAM: CHEST - 2 VIEW COMPARISON:  12/03/2023 FINDINGS: Shallow inspiration. Cardiac enlargement. Probable small bilateral pleural effusions. Bilateral hazy perihilar  opacities, likely edema but possibly pneumonia. No change since prior study. No pneumothorax. Mediastinal contours appear intact. Surgical clips in the right axilla. IMPRESSION: Cardiac enlargement with bilateral perihilar edema and small effusions, similar to prior study. Electronically Signed   By: Elsie Gravely M.D.   On: 12/22/2023 21:47    Dialysis Orders:  Unit: [facility] MWF  Time: 4H EDW: 62kg  Flows: 400/600 Bath: 2K/2Ca Access: AVF  Heparin : 3000  Venofer 50 q wk VDRA: Hectorl 6 q HD   Assessment/Plan: Sepsis. S/p IVF bolus. Antibiotics per primary team. Cultures pending. Pneumonia + pulm edema on chest CT.  Acute/chronic respiratory failure. Increased O2 requirement here. On 2L [place] at home  Acute encephalopathy. Related to above. Appears improved.  ESRD. HD MWF. Had full HD Monday. Extra HD today for volume, hyperkalemia.  Access: Concern for recirculation via AVF. Needs fistulogram. Will arrange once more stable.  Hyperkalemia. K 6.8 on admission after dialysis. Received temporizing meds. Correct further with HD. As above concern for recirculation causing HyperK  Hypotension. BP improved. On midodrine    Anemia. Hgb >12. Not on  ESA  Metabolic bone disease -  Continue home meds when appropriate.   Maisie Ronnald Acosta PA-C [facility] 12/23/2023, 3:26 PM

## 2023-12-23 NOTE — Progress Notes (Signed)
  Echocardiogram 2D Echocardiogram has been performed.  Christopher Davila 12/23/2023, 12:17 PM

## 2023-12-23 NOTE — Progress Notes (Addendum)
 NAME:  Artem Bunte, MRN:  [id number], DOB:  12-May-1971, LOS: 0 ADMISSION DATE:  12/22/2023, CONSULTATION DATE: 12/23/2023 REFERRING MD: TRH, CHIEF COMPLAINT: Altered mental status  History of Present Illness:  53 yo male presented from his facility with ams and hypotension. Pt reportedly has been compliant with his HD schedule and just had dialysis 7/7. Since dialysis pt was noted to be increasingly somnolent.    Initially pt was admitted with TRH however over the course of his stay in the ED pt BP began trending downward. He was given ivf to assist and his oxygen  requirement increased as well as his lactate. Although it does appear his BP is marginally improved. CCM was asked to consult and bring to ICU for hypotension and anticipation that he may req pressors +/- CRRT.    He was started on empiric abx after imaging revealed ?superimposed pna on top of pulmonary edema. Pt does endorse that he makes urine so perhaps could be etiology of infection. CTH negative for acute findings.    Pt is somnolent but protecting airway at the time of my exam. Initially, he would answer simple questions but then refused and would just mumble. He states he is in pain all over. He denies any sob, n/v/d no cough. On 2L [place] at baseline. At this time he just endorses he wants his pain medication.   Pertinent  Medical History   Past Medical History:  Diagnosis Date   Acute on chronic diastolic CHF (congestive heart failure) (HCC) 09/27/2019   Acute respiratory failure with hypoxia (HCC) 03/26/2021   Acute urinary retention 10/13/2019   Anemia    Blindness 09/04/2019   CHF (congestive heart failure) (HCC)    COVID-19    Diabetes mellitus    Diarrhea 10/14/2019   Epigastric abdominal pain    ESRD (end stage renal disease) (HCC)    Gastroparesis due to DM (HCC)    Hypertension    Hypertensive urgency 04/17/2019   Hypervolemia    Hypoalbuminemia 04/17/2019   Hypoxia    Metabolic acidosis, increased anion  gap 10/13/2019   Multifocal pneumonia 05/06/2021   Neuropathy of lower extremity    Oral candidiasis 06/26/2012   Palliative care encounter    Pancreatitis 06/26/2012   Weight loss 06/26/2012     Significant Hospital Events: Including procedures, antibiotic start and stop dates in addition to other pertinent events   7/8 PCCM consult for persistent hypotension and need for dialysis  Interim History / Subjective:  More arousable and interactive  Objective    Blood pressure 101/74, pulse 86, temperature 98.7 F (37.1 C), temperature source Oral, resp. rate (!) 21, height 5' 5 (1.651 m), weight 60 kg, SpO2 93%.        Intake/Output Summary (Last 24 hours) at 12/23/2023 1729 Last data filed at 12/23/2023 9344 Gross per 24 hour  Intake 371.8 ml  Output --  Net 371.8 ml   Filed Weights   12/23/23 0408  Weight: 60 kg    Examination: General: Middle-age, does not appear to be in distress, chronically ill-appearing HENT: Chronic scarring bilateral eyes, moist oral mucosa Lungs: Diminished breath sounds Cardiovascular: S1-S2 appreciated Abdomen: Soft, bowel sounds appreciated Extremities: Lower extremity edema Neuro: Easily arousable GU:   I reviewed last 24 h vitals and pain scores, last 48 h intake and output, last 24 h labs and trends, and last 24 h imaging results.  Resolved problem list   Assessment and Plan   Acute metabolic encephalopathy possible septic  shock Lactic acidosis Possible pneumonia - Cefepime , Flagyl , renal dose vancomycin  - Follow cultures - Elevated lactate likely secondary to decreased clearance  Hyperammonemia - Continue lactulose   Acute on chronic hypoxemic respiratory failure - Continue oxygen  supplementation  End-stage renal disease on dialysis - Monday Wednesday Friday - Appreciate nephrology follow-up  Severe pulmonary hypertension -Likely secondary to chronic diastolic dysfunction - May also be secondary to end-stage renal  disease  Electrolyte derangements - Being repleted  Diabetes - SSI  Transaminitis - Continue to trend  Chronic macrocytic anemia, Chronic thrombocytopenia   Best Practice (right click and Reselect all SmartList Selections daily)   Diet/type: Regular consistency (see orders) DVT prophylaxis prophylactic heparin   Pressure ulcer(s): N/A GI prophylaxis: N/A Lines: N/A Foley:  N/A Code Status:  full code Last date of multidisciplinary goals of care discussion [pending]  Labs   CBC: Recent Labs  Lab 12/22/23 2120 12/23/23 0101 12/23/23 0423 12/23/23 0445  WBC 13.5*  --   --  11.9*  NEUTROABS 11.8*  --   --   --   HGB 12.3* 12.6* 13.9 12.4*  HCT 39.0 37.0* 41.0 39.7  MCV 103.2*  --   --  104.2*  PLT 100*  --   --  122*    Basic Metabolic Panel: Recent Labs  Lab 12/22/23 2120 12/22/23 2241 12/23/23 0101 12/23/23 0423 12/23/23 0445 12/23/23 1048  NA 133*  --  135 135 136  --   K 6.8*  --  4.7 5.4* 5.3* 4.5  CL 92*  --   --   --  97*  --   CO2 23  --   --   --  19*  --   GLUCOSE 76  --   --   --  185*  --   BUN 23*  --   --   --  29*  --   CREATININE 4.47*  --   --   --  4.70*  --   CALCIUM  9.0  --   --   --  9.8  --   MG  --  2.2  --   --  2.2  --   PHOS  --   --   --   --  5.0*  --    GFR: Estimated Creatinine Clearance: 15.8 mL/min (A) (by C-G formula based on SCr of 4.7 mg/dL (H)). Recent Labs  Lab 12/22/23 2120 12/22/23 2251 12/23/23 0101 12/23/23 0425 12/23/23 0445  WBC 13.5*  --   --   --  11.9*  LATICACIDVEN  --  4.9* 3.6* 6.3* 7.1*    Liver Function Tests: Recent Labs  Lab 12/22/23 2120 12/23/23 0445  AST 234* 160*  ALT 181* 149*  ALKPHOS 95 78  BILITOT 2.4* 1.7*  PROT 7.6 6.6  ALBUMIN  3.5 3.0*   No results for input(s): LIPASE, AMYLASE in the last 168 hours. Recent Labs  Lab 12/23/23 0049  AMMONIA 63*    ABG    Component Value Date/Time   PHART 7.235 (L) 10/25/2019 1210   PCO2ART 55.1 (H) 10/25/2019 1210   PO2ART  127 (H) 10/25/2019 1210   HCO3 23.0 12/23/2023 0423   TCO2 24 12/23/2023 0423   ACIDBASEDEF 1.0 12/23/2023 0423   O2SAT 74 12/23/2023 0423     Coagulation Profile: Recent Labs  Lab 12/22/23 2241  INR 1.7*    Cardiac Enzymes: No results for input(s): CKTOTAL, CKMB, CKMBINDEX, TROPONINI in the last 168 hours.  HbA1C: Hgb A1c MFr Bld  Date/Time  Value Ref Range Status  12/04/2023 01:42 PM 6.5 (H) 4.8 - 5.6 % Final    Comment:    (NOTE) Diagnosis of Diabetes The following HbA1c ranges recommended by the American Diabetes Association (ADA) may be used as an aid in the diagnosis of diabetes mellitus.  Hemoglobin             Suggested A1C NGSP%              Diagnosis  <5.7                   Non Diabetic  5.7-6.4                Pre-Diabetic  >6.4                   Diabetic  <7.0                   Glycemic control for                       adults with diabetes.    12/26/2022 08:27 AM 5.9 (H) 4.8 - 5.6 % Final    Comment:    (NOTE)         Prediabetes: 5.7 - 6.4         Diabetes: >6.4         Glycemic control for adults with diabetes: <7.0     CBG: Recent Labs  Lab 12/23/23 0428 12/23/23 0532 12/23/23 0809 12/23/23 1115 12/23/23 1503  GLUCAP 176* 220* 131* 97 85    Review of Systems:   More arousable this afternoon  Past Medical History:  He,  has a past medical history of Acute on chronic diastolic CHF (congestive heart failure) (HCC) (09/27/2019), Acute respiratory failure with hypoxia (HCC) (03/26/2021), Acute urinary retention (10/13/2019), Anemia, Blindness (09/04/2019), CHF (congestive heart failure) (HCC), COVID-19, Diabetes mellitus, Diarrhea (10/14/2019), Epigastric abdominal pain, ESRD (end stage renal disease) (HCC), Gastroparesis due to DM (HCC), Hypertension, Hypertensive urgency (04/17/2019), Hypervolemia, Hypoalbuminemia (04/17/2019), Hypoxia, Metabolic acidosis, increased anion gap (10/13/2019), Multifocal pneumonia (05/06/2021), Neuropathy  of lower extremity, Oral candidiasis (06/26/2012), Palliative care encounter, Pancreatitis (06/26/2012), and Weight loss (06/26/2012).   Surgical History:   Past Surgical History:  Procedure Laterality Date   A/V SHUNT INTERVENTION N/A 10/21/2023   Procedure: A/V SHUNT INTERVENTION;  Surgeon: Melia Lynwood ORN, MD;  Location: [facility] INVASIVE CV LAB;  Service: Cardiovascular;  Laterality: N/A;  60% baslic vein   AV FISTULA PLACEMENT Right 10/29/2019   Procedure: RIGHT ARM BRACHIOBASILIC ARTERIOVENOUS (AV) FISTULA CREATION;  Surgeon: Oris Krystal FALCON, MD;  Location: [facility] OR;  Service: Vascular;  Laterality: Right;   BASCILIC VEIN TRANSPOSITION Right 01/05/2020   Procedure: RIGHT ARM SECOND STAGE BASCILIC VEIN TRANSPOSITION;  Surgeon: Oris Krystal FALCON, MD;  Location: [facility] OR;  Service: Vascular;  Laterality: Right;   EYE SURGERY     IR FLUORO GUIDE CV LINE RIGHT  10/28/2019   IR US  GUIDE VASC ACCESS RIGHT  10/28/2019     Social History:   reports that he quit smoking about 8 years ago. His smoking use included cigarettes. He has never used smokeless tobacco. He reports that he does not currently use alcohol . He reports that he does not use drugs.   Family History:  His family history includes CAD in his cousin and maternal aunt; Diabetes in his maternal aunt and mother; Hypertension in his mother; Lung disease in his mother.   Allergies  Allergies  Allergen Reactions   Morphine  Itching    Pt prefers not to be given this drug and Allergic, per [facility]    Extra CCM time 30 minutes

## 2023-12-23 NOTE — Progress Notes (Signed)
Pt receives out-pt HD at [facility] on MWF 11:45 am chair time. Will assist as needed.   Olivia Canter Renal Navigator [phone]

## 2023-12-23 NOTE — ED Provider Notes (Signed)
 I assumed care of this patient from previous provider.  Please see their note for further details of history, exam, and MDM.   Briefly patient is a 53 y.o. male who presented for somnolence and hypotension noted at rehab facility.  Patient is history of ESRD on dialysis MWF.  Initial workup notable for hyperkalemia.  No EKG changes.  Temporized with insulin  and Lokelma .  Dr. Elnor spoke with Dr. Babs from nephrology who recommended continued Lokelma  and admission for dialysis in the morning.  Patient also noted to have leukocytosis and elevated lactic acid.  Currently awaiting CT imaging  CT chest abdomen and pelvis notable for pulmonary edema and possible pneumonia.  Code sepsis initiated and patient started on empiric antibiotics.  1:55 AM I spoke with Dr. Keturah from the hospitalist service who will admit patient for further workup and management.  .Critical Care  Performed by: Trine Raynell Moder, MD Authorized by: Trine Raynell Moder, MD   Critical care provider statement:    Critical care time (minutes):  30   Critical care was necessary to treat or prevent imminent or life-threatening deterioration of the following conditions:  Sepsis, circulatory failure and renal failure   Critical care was time spent personally by me on the following activities:  Development of treatment plan with patient or surrogate, discussions with consultants, evaluation of patient's response to treatment, examination of patient, ordering and review of laboratory studies, ordering and review of radiographic studies, ordering and performing treatments and interventions, pulse oximetry, re-evaluation of patient's condition and review of old charts   I assumed direction of critical care for this patient from another provider in my specialty: yes     Care discussed with: admitting provider          Trine Raynell Moder, MD 12/23/23 [phone]

## 2023-12-23 NOTE — ED Notes (Addendum)
 Pt brought to green hypotensive and diaphoretic

## 2023-12-23 NOTE — ED Notes (Signed)
 Date and time results received: 12/23/23 0523 (use smartphrase .now to insert current time)  Test: lactic acid Critical Value: 7.1  Name of Provider Notified: DOROTHA Dawson, MD

## 2023-12-24 DIAGNOSIS — J810 Acute pulmonary edema: Secondary | ICD-10-CM | POA: Diagnosis not present

## 2023-12-24 DIAGNOSIS — E875 Hyperkalemia: Secondary | ICD-10-CM | POA: Diagnosis not present

## 2023-12-24 DIAGNOSIS — G9341 Metabolic encephalopathy: Secondary | ICD-10-CM | POA: Diagnosis not present

## 2023-12-24 DIAGNOSIS — J9621 Acute and chronic respiratory failure with hypoxia: Secondary | ICD-10-CM

## 2023-12-24 DIAGNOSIS — I10 Essential (primary) hypertension: Secondary | ICD-10-CM

## 2023-12-24 LAB — CBC
HCT: 35 % — ABNORMAL LOW (ref 39.0–52.0)
Hemoglobin: 11.7 g/dL — ABNORMAL LOW (ref 13.0–17.0)
MCH: 33.5 pg (ref 26.0–34.0)
MCHC: 33.4 g/dL (ref 30.0–36.0)
MCV: 100.3 fL — ABNORMAL HIGH (ref 80.0–100.0)
Platelets: 110 K/uL — ABNORMAL LOW (ref 150–400)
RBC: 3.49 MIL/uL — ABNORMAL LOW (ref 4.22–5.81)
RDW: 15.9 % — ABNORMAL HIGH (ref 11.5–15.5)
WBC: 13.1 K/uL — ABNORMAL HIGH (ref 4.0–10.5)
nRBC: 0.4 % — ABNORMAL HIGH (ref 0.0–0.2)

## 2023-12-24 LAB — COMPREHENSIVE METABOLIC PANEL WITH GFR
ALT: 310 U/L — ABNORMAL HIGH (ref 0–44)
AST: 312 U/L — ABNORMAL HIGH (ref 15–41)
Albumin: 3.4 g/dL — ABNORMAL LOW (ref 3.5–5.0)
Alkaline Phosphatase: 81 U/L (ref 38–126)
Anion gap: 18 — ABNORMAL HIGH (ref 5–15)
BUN: 20 mg/dL (ref 6–20)
CO2: 23 mmol/L (ref 22–32)
Calcium: 9.4 mg/dL (ref 8.9–10.3)
Chloride: 94 mmol/L — ABNORMAL LOW (ref 98–111)
Creatinine, Ser: 3.94 mg/dL — ABNORMAL HIGH (ref 0.61–1.24)
GFR, Estimated: 18 mL/min — ABNORMAL LOW (ref >60)
Glucose, Bld: 75 mg/dL (ref 70–99)
Potassium: 3.6 mmol/L (ref 3.5–5.1)
Sodium: 135 mmol/L (ref 135–145)
Total Bilirubin: 2 mg/dL — ABNORMAL HIGH (ref 0.0–1.2)
Total Protein: 7.3 g/dL (ref 6.5–8.1)

## 2023-12-24 LAB — GLUCOSE, CAPILLARY
Glucose-Capillary: 108 mg/dL — ABNORMAL HIGH (ref 70–99)
Glucose-Capillary: 110 mg/dL — ABNORMAL HIGH (ref 70–99)
Glucose-Capillary: 115 mg/dL — ABNORMAL HIGH (ref 70–99)
Glucose-Capillary: 117 mg/dL — ABNORMAL HIGH (ref 70–99)
Glucose-Capillary: 143 mg/dL — ABNORMAL HIGH (ref 70–99)
Glucose-Capillary: 76 mg/dL (ref 70–99)
Glucose-Capillary: 81 mg/dL (ref 70–99)

## 2023-12-24 LAB — PROCALCITONIN: Procalcitonin: 1.94 ng/mL

## 2023-12-24 LAB — HEPATITIS B SURFACE ANTIBODY, QUANTITATIVE: Hep B S AB Quant (Post): 151 m[IU]/mL

## 2023-12-24 LAB — POTASSIUM
Potassium: 4.8 mmol/L (ref 3.5–5.1)
Potassium: 3.7 mmol/L (ref 3.5–5.1)

## 2023-12-24 MED ORDER — ACETAMINOPHEN 325 MG PO TABS
650.0000 mg | ORAL_TABLET | Freq: Four times a day (QID) | ORAL | Status: DC
  Administered 2023-12-24 – 2023-12-25 (×4): 650 mg via ORAL
  Filled 2023-12-24 (×4): qty 2

## 2023-12-24 MED ORDER — NEPRO/CARBSTEADY PO LIQD
237.0000 mL | Freq: Two times a day (BID) | ORAL | Status: DC
  Administered 2023-12-24 – 2023-12-25 (×2): 237 mL via ORAL

## 2023-12-24 MED ORDER — DICLOFENAC SODIUM 1 % EX GEL
2.0000 g | Freq: Four times a day (QID) | CUTANEOUS | Status: DC
  Administered 2023-12-25 – 2023-12-27 (×7): 2 g via TOPICAL
  Filled 2023-12-24: qty 100

## 2023-12-24 MED ORDER — LACTULOSE 10 GM/15ML PO SOLN: 30.0000 g | Freq: Three times a day (TID) | ORAL | Status: DC

## 2023-12-24 MED ORDER — SODIUM CHLORIDE 0.9 % IV SOLN
1.0000 g | INTRAVENOUS | Status: AC
  Administered 2023-12-24 – 2023-12-28 (×4): 1 g via INTRAVENOUS
  Filled 2023-12-24 (×4): qty 10

## 2023-12-24 MED ORDER — CHLORHEXIDINE GLUCONATE CLOTH 2 % EX PADS
6.0000 | MEDICATED_PAD | Freq: Every day | CUTANEOUS | Status: DC
  Administered 2023-12-26: 6 via TOPICAL

## 2023-12-24 MED ORDER — VANCOMYCIN HCL 500 MG/100ML IV SOLN
500.0000 mg | Freq: Once | INTRAVENOUS | Status: AC
  Administered 2023-12-24: 500 mg via INTRAVENOUS
  Filled 2023-12-24: qty 100

## 2023-12-24 NOTE — TOC Progression Note (Signed)
 Transition of Care [facility]) - Progression Note    Patient Details  Name: Christopher Davila MRN: [id number] Date of Birth: 08-Aug-1970  [facility]) CM/SW Contact  Lauraine FORBES Saa, LCSW Phone Number: 12/24/2023, 11:34 AM  Clinical Narrative:     11:34 AM Per progressions, patient is expected to be able to discharge end of this week. CSW informed [facility] L[facility] admissions of patient's expected discharge date.  Expected Discharge Plan: Long Term Nursing Home Barriers to Discharge: Continued Medical Work up  Expected Discharge Plan and Services In-house Referral: Clinical Social Work   Post Acute Care Choice: Skilled Nursing Facility, Nursing Home Living arrangements for the past 2 months: Skilled Nursing Facility                                       Social Determinants of Health (SDOH) Interventions SDOH Screenings   Food Insecurity: No Food Insecurity (12/04/2023)  Housing: Low Risk  (12/08/2023)  Transportation Needs: No Transportation Needs (12/04/2023)  Utilities: Not At Risk (12/04/2023)  Tobacco Use: Medium Risk (12/03/2023)    Readmission Risk Interventions    12/05/2023    3:04 PM 01/22/2023    1:23 PM 04/06/2021    1:01 PM  Readmission Risk Prevention Plan  Transportation Screening Complete Complete Complete  PCP or Specialist Appt within 3-5 Days Complete    HRI or Home Care Consult Complete    Social Work Consult for Recovery Care Planning/Counseling Complete    Palliative Care Screening Not Applicable    Medication Review Oceanographer) Complete Complete Complete  PCP or Specialist appointment within 3-5 days of discharge  Complete Complete  HRI or Home Care Consult   Complete  SW Recovery Care/Counseling Consult  Complete Complete  Palliative Care Screening   Not Applicable  Skilled Nursing Facility  Complete Complete

## 2023-12-24 NOTE — Plan of Care (Signed)
  Problem: Clinical Measurements: Goal: Ability to maintain clinical measurements within normal limits will improve Outcome: Progressing   Problem: Coping: Goal: Level of anxiety will decrease Outcome: Progressing   Problem: Elimination: Goal: Will not experience complications related to bowel motility Outcome: Progressing   Problem: Pain Managment: Goal: General experience of comfort will improve and/or be controlled Outcome: Progressing

## 2023-12-24 NOTE — Progress Notes (Addendum)
 [facility] Progress Note  Subjective:    Vitals:   12/24/23 0945 12/24/23 1000 12/24/23 1015 12/24/23 1030  BP: 113/78 (!) 123/94 111/78 118/78  Pulse: 89  98   Resp: 19 14 16  (!) 21  Temp:      TempSrc:      SpO2: (!) 88%  98%   Weight:      Height:        Exam: General: Sitting up in bed, nad, on nasal oxygen   Head: NCAT sclera not icteric MMM Neck: Supple. No JVD appreciated  Lungs: Clear bilaterally. Normal WOB  Heart: RRR, no murmur, rub, or gallop  Abdomen: soft non-tender  Lower extremities:without edema or ischemic changes, no open wounds  Neuro: A & O X 3. Moves all extremities spontaneously. Psych:  Responds to questions appropriately with a normal affect. Dialysis Access: LUE AVF +bruit     OP HD: [facility] MWF 4h  62kg  B400  2K  AVF  Heparin  3000 Hectorol  6 micrograms qhd   Assessment/ Plan: Sepsis: Sp IVF bolus, IV abx per primary team. Possible pna. Per pmd Acute/chronic hypoxemic respiratory failure: increased O2 requirement here, is on 2L [place] at home. Per CT +gg changes c/w edema. HD overnight w/ bp's dropping into 80s, only 2.1 L removed.  Acute encephalopathy: Related to above. Appears improved.  ESRD: HD MWF. Had full HD Monday. Had HD last night here. Next HD tomorrow.  Access: Concern for recirculation via AVF at OP unit. Needs fistulogram when more stable.  Hyperkalemia: K 6.8 on admission -> down to 3.7 this am after HD.  Hypotension: taking 2-3 bp lowering meds at home. On hold for now.  Anemia: Hgb >12. Not on  ESA    Myer Fret MD  [facility] 12/24/2023, 11:11 AM  Recent Labs  Lab 12/23/23 0445 12/23/23 1048 12/24/23 0841 12/24/23 0842  HGB 12.4*  --  11.7*  --   ALBUMIN  3.0*  --  3.4*  --   CALCIUM  9.8  --  9.4  --   PHOS 5.0*  --   --   --   CREATININE 4.70*  --  3.94*  --   K 5.3*   < > 3.6 3.7   < > = values in this interval not displayed.   No results for input(s): IRON, TIBC, FERRITIN in the last 168  hours. Inpatient medications:  acetaminophen   650 mg Oral QID   Chlorhexidine  Gluconate Cloth  6 each Topical Q0600   diclofenac  Sodium  2 g Topical QID   feeding supplement (NEPRO CARB STEADY)  237 mL Oral BID BM   gabapentin   300 mg Oral QHS   heparin  injection (subcutaneous)  5,000 Units Subcutaneous Q12H   midodrine   5 mg Oral TID WC   mupirocin  ointment  1 Application Nasal BID   vancomycin  variable dose per unstable renal function (pharmacist dosing)   Does not apply See admin instructions    albumin  human     ceFEPime  (MAXIPIME ) IV     vancomycin  500 mg (12/24/23 1105)   albumin  human, albuterol , oxyCODONE -acetaminophen 

## 2023-12-24 NOTE — Progress Notes (Signed)
 NAME:  Christopher Davila, MRN:  [id number], DOB:  June 26, 1970, LOS: 1 ADMISSION DATE:  12/22/2023, CONSULTATION DATE:  12/23/2023 REFERRING MD: TRH, CHIEF COMPLAINT: Altered mental status  History of Present Illness:  53 yo male presented from his facility with ams and hypotension. Pt reportedly has been compliant with his HD schedule and just had dialysis 7/7. Since dialysis pt was noted to be increasingly somnolent.    Initially pt was admitted with TRH however over the course of his stay in the ED pt BP began trending downward. He was given ivf to assist and his oxygen  requirement increased as well as his lactate. Although it does appear his BP is marginally improved. CCM was asked to consult and bring to ICU for hypotension and anticipation that he may req pressors +/- CRRT.    He was started on empiric abx after imaging revealed ?superimposed pna on top of pulmonary edema. Pt does endorse that he makes urine so perhaps could be etiology of infection. CTH negative for acute findings.    Pt is somnolent but protecting airway at the time of my exam. Initially, he would answer simple questions but then refused and would just mumble. He states he is in pain all over. He denies any sob, n/v/d no cough. On 2L [place] at baseline. At this time he just endorses he wants his pain medication.   Pertinent  Medical History   Past Medical History:  Diagnosis Date   Acute on chronic diastolic CHF (congestive heart failure) (HCC) 09/27/2019   Acute respiratory failure with hypoxia (HCC) 03/26/2021   Acute urinary retention 10/13/2019   Anemia    Blindness 09/04/2019   CHF (congestive heart failure) (HCC)    COVID-19    Diabetes mellitus    Diarrhea 10/14/2019   Epigastric abdominal pain    ESRD (end stage renal disease) (HCC)    Gastroparesis due to DM (HCC)    Hypertension    Hypertensive urgency 04/17/2019   Hypervolemia    Hypoalbuminemia 04/17/2019   Hypoxia    Metabolic acidosis, increased anion  gap 10/13/2019   Multifocal pneumonia 05/06/2021   Neuropathy of lower extremity    Oral candidiasis 06/26/2012   Palliative care encounter    Pancreatitis 06/26/2012   Weight loss 06/26/2012    Significant Hospital Events: Including procedures, antibiotic start and stop dates in addition to other pertinent events   7/8 PCCM consult for persistent hypotension and need for dialysis   Interim History / Subjective:  Dialysis again yesterday. This AM the patient is awake and conversing. His concern is acute-on-chronic pain in the L knee (he shares that he falls a lot). Denies dyspnea, CP, lightheadedness. In interview, reports the year as 2025 vs 2026, the day as Wednesday vs Thursday. Recognizes his presence at [facility] hospital.  Objective    Blood pressure 118/78, pulse 98, temperature 98.7 F (37.1 C), temperature source Oral, resp. rate (!) 21, height 5' 5 (1.651 m), weight 60 kg, SpO2 98%.        Intake/Output Summary (Last 24 hours) at 12/24/2023 1146 Last data filed at 12/24/2023 0700 Gross per 24 hour  Intake 100.1 ml  Output 2100 ml  Net -1999.9 ml   Filed Weights   12/23/23 0408  Weight: 60 kg    Examination: General: Middle-age, does not appear to be in distress, chronically ill-appearing HENT: Chronic scarring bilateral eyes, moist oral mucosa Lungs: Diminished breath sounds but CTABL Cardiovascular: S1-S2 appreciated Abdomen: Soft, bowel sounds appreciated Extremities:  trace extremity edema Neuro: Easily arousable  Resolved problem list  Hyperkalemia - resolved with HD Shock - multifactorial. BP noted to trop in ED at time of admission, received fluids but did not require vasopressor support.  Assessment and Plan   Acute on chronic hypoxemic respiratory failure 2L O2 at baseline 2/2 Volume Overload, possible pneumonia Hx ESRD MWF and Severe idiopathic pulmonary HTN with R heart failure Nephrology consulting, thank you. Volume overload is being managed with HD  (sessions on M and T and will go again tomorrow). The patient did not miss any dialysis. Suspect the volume/respiratory issue is ESRD + severe R vent dysfunction from idiopathic pulmonary HTN. Curbsided advanced HF regarding the R vent and given the patient's overall health status, there is little they can offer. Fortunately he is down to 6L O2 by [place] today and feeling well. In addition to volume removal will also also continue treatment for pneumonia. Imaging and exam are not clear - but he has leukocytosis. Checking a procal this morning but anticipate keeping abx. - HD for volume removal per nephrology - Tx pneumonia with cefepime , renal dose vancomycin . Broad abx given MRSA + and recent hospitalization. - Follow blood cultures and trend fevers, WBCs  Multifactorial Acute metabolic encephalopathy - resolving Due to the above This am his mentation waxes and wanes but overall is improving. Report of lethargy at admission, but now awake. At times he accurately reports date and time, but also reports the day and year as Thursday/2026, which is wrong. Otherwise he is speaking fluently and appropriately. No longer lethargic. CT head at admission nonacute. Continue treatment of above and monitor.  Hyperammonemia Ammonia 66 at admission and lethargic. No major liver history. Suspect some congestive hepatopathy. Treated with lactulose  yesterday. Will treat one more day as his mentation is nearly normal, but not quite there. Likely not needed thereafter.    Diabetes - SSI   Transaminitis - Continue to trend   Chronic macrocytic anemia Chronic thrombocytopenia - continue to trend  L knee pain Acute on chronic pain. He reports that he falls often. No recent major trauma or surgery. On exam, no apparent swelling or warmth so doubt septic joint, gout. Suspect underlying arthritis. Will treat with tylenol  and Voltaren  gel.   Pt is not requiring pressers or increasing respiratory support. Will discuss  transfer from ICU care today.  Best Practice (right click and Reselect all SmartList Selections daily)   Diet/type: Renal with fluid restridction DVT prophylaxis prophylactic heparin   Pressure ulcer(s): N/A GI prophylaxis: N/A Lines: N/A Foley:  N/A Code Status:  full code Last date of multidisciplinary goals of care discussion [pending]  Labs   CBC: Recent Labs  Lab 12/22/23 2120 12/23/23 0101 12/23/23 0423 12/23/23 0445 12/24/23 0841  WBC 13.5*  --   --  11.9* 13.1*  NEUTROABS 11.8*  --   --   --   --   HGB 12.3* 12.6* 13.9 12.4* 11.7*  HCT 39.0 37.0* 41.0 39.7 35.0*  MCV 103.2*  --   --  104.2* 100.3*  PLT 100*  --   --  122* 110*    Basic Metabolic Panel: Recent Labs  Lab 12/22/23 2120 12/22/23 2241 12/23/23 0101 12/23/23 0423 12/23/23 0445 12/23/23 1048 12/23/23 1747 12/23/23 2217 12/24/23 0210 12/24/23 0841 12/24/23 0842  NA 133*  --  135 135 136  --   --   --   --  135  --   K 6.8*  --  4.7 5.4* 5.3*   < >  6.1* 4.5 4.8 3.6 3.7  CL 92*  --   --   --  97*  --   --   --   --  94*  --   CO2 23  --   --   --  19*  --   --   --   --  23  --   GLUCOSE 76  --   --   --  185*  --   --   --   --  75  --   BUN 23*  --   --   --  29*  --   --   --   --  20  --   CREATININE 4.47*  --   --   --  4.70*  --   --   --   --  3.94*  --   CALCIUM  9.0  --   --   --  9.8  --   --   --   --  9.4  --   MG  --  2.2  --   --  2.2  --   --   --   --   --   --   PHOS  --   --   --   --  5.0*  --   --   --   --   --   --    < > = values in this interval not displayed.   GFR: Estimated Creatinine Clearance: 18.8 mL/min (A) (by C-G formula based on SCr of 3.94 mg/dL (H)). Recent Labs  Lab 12/22/23 2120 12/22/23 2251 12/23/23 0101 12/23/23 0425 12/23/23 0445 12/24/23 0841  WBC 13.5*  --   --   --  11.9* 13.1*  LATICACIDVEN  --  4.9* 3.6* 6.3* 7.1*  --     Liver Function Tests: Recent Labs  Lab 12/22/23 2120 12/23/23 0445 12/24/23 0841  AST 234* 160* 312*  ALT  181* 149* 310*  ALKPHOS 95 78 81  BILITOT 2.4* 1.7* 2.0*  PROT 7.6 6.6 7.3  ALBUMIN  3.5 3.0* 3.4*   No results for input(s): LIPASE, AMYLASE in the last 168 hours. Recent Labs  Lab 12/23/23 0049  AMMONIA 63*    ABG    Component Value Date/Time   PHART 7.235 (L) 10/25/2019 1210   PCO2ART 55.1 (H) 10/25/2019 1210   PO2ART 127 (H) 10/25/2019 1210   HCO3 23.0 12/23/2023 0423   TCO2 24 12/23/2023 0423   ACIDBASEDEF 1.0 12/23/2023 0423   O2SAT 74 12/23/2023 0423     Coagulation Profile: Recent Labs  Lab 12/22/23 2241  INR 1.7*    Cardiac Enzymes: No results for input(s): CKTOTAL, CKMB, CKMBINDEX, TROPONINI in the last 168 hours.  HbA1C: Hgb A1c MFr Bld  Date/Time Value Ref Range Status  12/04/2023 01:42 PM 6.5 (H) 4.8 - 5.6 % Final    Comment:    (NOTE) Diagnosis of Diabetes The following HbA1c ranges recommended by the American Diabetes Association (ADA) may be used as an aid in the diagnosis of diabetes mellitus.  Hemoglobin             Suggested A1C NGSP%              Diagnosis  <5.7                   Non Diabetic  5.7-6.4                Pre-Diabetic  >  6.4                   Diabetic  <7.0                   Glycemic control for                       adults with diabetes.    12/26/2022 08:27 AM 5.9 (H) 4.8 - 5.6 % Final    Comment:    (NOTE)         Prediabetes: 5.7 - 6.4         Diabetes: >6.4         Glycemic control for adults with diabetes: <7.0     CBG: Recent Labs  Lab 12/23/23 1927 12/23/23 2306 12/24/23 0319 12/24/23 0716 12/24/23 1116  GLUCAP 66* 103* 81 76 110*    Review of Systems:   Complains of Knee pain. Otherwise negative.  Past Medical History:  He,  has a past medical history of Acute on chronic diastolic CHF (congestive heart failure) (HCC) (09/27/2019), Acute respiratory failure with hypoxia (HCC) (03/26/2021), Acute urinary retention (10/13/2019), Anemia, Blindness (09/04/2019), CHF (congestive heart failure)  (HCC), COVID-19, Diabetes mellitus, Diarrhea (10/14/2019), Epigastric abdominal pain, ESRD (end stage renal disease) (HCC), Gastroparesis due to DM [facility]), Hypertension, Hypertensive urgency (04/17/2019), Hypervolemia, Hypoalbuminemia (04/17/2019), Hypoxia, Metabolic acidosis, increased anion gap (10/13/2019), Multifocal pneumonia (05/06/2021), Neuropathy of lower extremity, Oral candidiasis (06/26/2012), Palliative care encounter, Pancreatitis (06/26/2012), and Weight loss (06/26/2012).   Surgical History:   Past Surgical History:  Procedure Laterality Date   A/V SHUNT INTERVENTION N/A 10/21/2023   Procedure: A/V SHUNT INTERVENTION;  Surgeon: Melia Lynwood ORN, MD;  Location: [facility] INVASIVE CV LAB;  Service: Cardiovascular;  Laterality: N/A;  60% baslic vein   AV FISTULA PLACEMENT Right 10/29/2019   Procedure: RIGHT ARM BRACHIOBASILIC ARTERIOVENOUS (AV) FISTULA CREATION;  Surgeon: Oris Krystal FALCON, MD;  Location: [facility] OR;  Service: Vascular;  Laterality: Right;   BASCILIC VEIN TRANSPOSITION Right 01/05/2020   Procedure: RIGHT ARM SECOND STAGE BASCILIC VEIN TRANSPOSITION;  Surgeon: Oris Krystal FALCON, MD;  Location: [facility] OR;  Service: Vascular;  Laterality: Right;   EYE SURGERY     IR FLUORO GUIDE CV LINE RIGHT  10/28/2019   IR US  GUIDE VASC ACCESS RIGHT  10/28/2019     Social History:   reports that he quit smoking about 8 years ago. His smoking use included cigarettes. He has never used smokeless tobacco. He reports that he does not currently use alcohol . He reports that he does not use drugs.   Family History:  His family history includes CAD in his cousin and maternal aunt; Diabetes in his maternal aunt and mother; Hypertension in his mother; Lung disease in his mother.   Allergies Allergies  Allergen Reactions   Morphine  Itching    Pt prefers not to be given this drug and Allergic, per [facility]     Home Medications  Prior to Admission medications   Medication Sig Start Date End Date Taking? Authorizing  Provider  amLODipine  (NORVASC ) 5 MG tablet Take 5-10 mg by mouth See admin instructions. Take 10 mg by mouth on Sun/Tues/Thurs/Sat and 5 mg on Mon/Wed/Fri 05/10/22  Yes [provider]  aspirin  EC 81 MG tablet Take 1 tablet (81 mg total) by mouth daily. 05/24/15  Yes Ghimire, Donalda HERO, MD  brimonidine -timolol  (COMBIGAN ) 0.2-0.5 % ophthalmic solution Place 1 drop into both eyes in the morning and at bedtime.  Yes [provider]  calcium  acetate (PHOSLO ) 667 MG capsule Take 1 capsule (667 mg total) by mouth 3 (three) times daily with meals. 11/19/19  Yes Odell Celinda Balo, MD  cloNIDine  (CATAPRES ) 0.1 MG tablet Take 0.1 mg by mouth every 12 (twelve) hours as needed (Hypertension).   Yes [provider]  Dextromethorphan-guaiFENesin  (MUCINEX  DM MAXIMUM STRENGTH) 60-1200 MG TB12 Take 1 tablet by mouth 2 (two) times daily. 12/11/23 12/25/23 Yes [provider]  famotidine  (PEPCID ) 20 MG tablet Take 20 mg by mouth at bedtime. 05/10/22  Yes [provider]  gabapentin  (NEURONTIN ) 300 MG capsule Take 300 mg by mouth daily.   Yes [provider]  hydrALAZINE  (APRESOLINE ) 25 MG tablet Take 25 mg by mouth 3 (three) times daily.   Yes [provider]  hyoscyamine (ANASPAZ) 0.125 MG TBDP disintergrating tablet Place 0.125 mg under the tongue every 4 (four) hours as needed (excess secretions). Not to exceed 6 doses in 24 hours.   Yes [provider]  latanoprost  (XALATAN ) 0.005 % ophthalmic solution Place 1 drop into the left eye at bedtime. 09/23/19  Yes Vann, Jessica U, DO  losartan  (COZAAR ) 100 MG tablet Take 1 tablet (100 mg total) by mouth daily. 11/19/19  Yes Odell Celinda Balo, MD  Menthol, Topical Analgesic, 5 % GEL Apply 1 application  topically every 8 (eight) hours as needed (for pain- affected areas).   Yes [provider]  metolazone  (ZAROXOLYN ) 5 MG tablet Take 5 mg by mouth at bedtime. 05/10/22  Yes [provider]  Nutritional Supplements (NOVASOURCE RENAL) LIQD Take 237 mLs by mouth every Monday, Wednesday, and Friday.   Yes [provider]  ondansetron  (ZOFRAN ) 4 MG tablet Take 4 mg by mouth every 6 (six) hours as needed for nausea or vomiting.   Yes [provider]  oxyCODONE -acetaminophen  (PERCOCET/ROXICET) 5-325 MG tablet Take 1 tablet by mouth See admin instructions. Take 1 tablet by mouth at 3 AM, 9 AM, 3 PM, and 9 PM   Yes [provider]  OXYGEN  Inhale 2 L/min into the lungs continuous.   Yes [provider]  polyethylene glycol (MIRALAX  / GLYCOLAX ) 17 g packet Take 17 g by mouth every 12 (twelve) hours as needed (for constipation).   Yes [provider]  tamsulosin  (FLOMAX ) 0.4 MG CAPS capsule Take 0.4 mg by mouth daily.   Yes [provider]  torsemide  (DEMADEX ) 100 MG tablet Take 100 mg by mouth 2 (two) times daily.   Yes [provider]  guaiFENesin  (MUCINEX ) 600 MG 12 hr tablet Take 1 tablet (600 mg total) by mouth 2 (two) times daily. Patient not taking: Reported on 12/23/2023 12/08/23   Dino Antu, MD     Critical care time: 35 mins    Lonni Africa, DO IM Resident PGY-2

## 2023-12-24 NOTE — Evaluation (Signed)
 Physical Therapy Evaluation Patient Details Name: Christopher Davila MRN: [id number] DOB: 01-14-1971 Today's Date: 12/24/2023  History of Present Illness  Patient is a 53 y/o male admitted 12/22/23 with AMS and hypotension post dialysis.  Found to have PNA and pulmonary edema with increased O2 requirement.  PMH includes CHF, ESRD on HD, blindness, HTN, gastroparesis, DM, HTN.  Clinical Impression  Patient presents with decreased mobility due to generalized weakness, decreased balance, decreased activity tolerance with hypotension while standing.  Patient previously at LTC facility able to ambulate to bathroom or use wheelchair.  Currently needing A for all aspects of mobility and unable to ambulate due to BP drop in standing.  Patient will benefit from skilled PT in the acute setting and from PT at facility at d/c.  Orthostatic VS for the past 24 hrs (Last 3 readings):  BP- Lying BP- Sitting BP- Standing at 0 minutes  12/24/23 1700 109/74 113/83 (!) 87/63           If plan is discharge home, recommend the following: A little help with walking and/or transfers;A lot of help with bathing/dressing/bathroom;Direct supervision/assist for medications management;Help with stairs or ramp for entrance;Assist for transportation   Can travel by private vehicle        Equipment Recommendations None recommended by PT  Recommendations for Other Services       Functional Status Assessment Patient has had a recent decline in their functional status and demonstrates the ability to make significant improvements in function in a reasonable and predictable amount of time.     Precautions / Restrictions Precautions Precautions: Fall Precaution/Restrictions Comments: watch BP/O2      Mobility  Bed Mobility Overal bed mobility: Needs Assistance Bed Mobility: Supine to Sit, Sit to Supine     Supine to sit: Min assist, HOB elevated Sit to supine: Min assist   General bed mobility comments: assist  for lifting trunk, to supine assist for positioning, though pt able to scoot up in bed using rails    Transfers Overall transfer level: Needs assistance Equipment used: 1 person hand held assist Transfers: Sit to/from Stand Sit to Stand: Min assist           General transfer comment: assist for balance pt pulling up on therapist hands    Ambulation/Gait Ambulation/Gait assistance: Mod assist Gait Distance (Feet): 1 Feet Assistive device: 1 person hand held assist Gait Pattern/deviations: Step-to pattern       General Gait Details: side steps toward [facility] with A for balance, lateral weight shift to move feet  Stairs            Wheelchair Mobility     Tilt Bed    Modified Rankin (Stroke Patients Only)       Balance Overall balance assessment: Needs assistance   Sitting balance-Leahy Scale: Fair     Standing balance support: Bilateral upper extremity supported, Single extremity supported Standing balance-Leahy Scale: Poor Standing balance comment: A needed for balance in standing                             Pertinent Vitals/Pain Pain Assessment Pain Assessment: No/denies pain    Home Living Family/patient expects to be discharged to:: Skilled nursing facility                   Additional Comments: from [facility] there under LTC status    Prior Function  Mobility Comments: reports walking at facility and using wheelchair some       Extremity/Trunk Assessment   Upper Extremity Assessment Upper Extremity Assessment: Generalized weakness    Lower Extremity Assessment Lower Extremity Assessment: Generalized weakness       Communication   Communication Communication: No apparent difficulties    Cognition Arousal: Alert Behavior During Therapy: WFL for tasks assessed/performed   PT - Cognitive impairments: No apparent impairments                       PT - Cognition Comments: not  formally tested Following commands: Intact       Cueing Cueing Techniques: Verbal cues     General Comments General comments (skin integrity, edema, etc.): noted BP drop after standing, RN aware; see flowsheet, after back supine BP 74/59, RN aware, repeat check 123/85.    Exercises     Assessment/Plan    PT Assessment Patient needs continued PT services  PT Problem List Decreased strength;Decreased balance;Decreased mobility;Cardiopulmonary status limiting activity;Decreased activity tolerance       PT Treatment Interventions DME instruction;Gait training;Patient/family education;Functional mobility training;Therapeutic activities;Therapeutic exercise;Balance training    PT Goals (Current goals can be found in the Care Plan section)  Acute Rehab PT Goals Patient Stated Goal: walk more independently PT Goal Formulation: With patient Time For Goal Achievement: 01/07/24 Potential to Achieve Goals: Fair    Frequency Min 1X/week     Co-evaluation               AM-PAC PT 6 Clicks Mobility  Outcome Measure Help needed turning from your back to your side while in a flat bed without using bedrails?: A Lot Help needed moving from lying on your back to sitting on the side of a flat bed without using bedrails?: A Lot Help needed moving to and from a bed to a chair (including a wheelchair)?: A Lot Help needed standing up from a chair using your arms (e.g., wheelchair or bedside chair)?: A Lot Help needed to walk in hospital room?: Total Help needed climbing 3-5 steps with a railing? : Total 6 Click Score: 10    End of Session Equipment Utilized During Treatment: Gait belt;Oxygen  Activity Tolerance: Patient limited by fatigue;Treatment limited secondary to medical complications (Comment) (orthostatic) Patient left: in bed;with call bell/phone within reach   PT Visit Diagnosis: Other abnormalities of gait and mobility (R26.89);Difficulty in walking, not elsewhere classified  (R26.2);Muscle weakness (generalized) (M62.81)    Time: 8571-8544 PT Time Calculation (min) (ACUTE ONLY): 27 min   Charges:   PT Evaluation $PT Eval Moderate Complexity: 1 Mod PT Treatments $Therapeutic Activity: 8-22 mins PT General Charges $$ ACUTE PT VISIT: 1 Visit         Micheline Portal, PT Acute Rehabilitation Services Office:[phone] 12/24/2023   Montie Portal 12/24/2023, 5:24 PM

## 2023-12-24 NOTE — Progress Notes (Signed)
   12/24/23 0700  Vitals  BP 111/80  MAP (mmHg) 90  BP Location Left Arm  BP Method Automatic  Patient Position (if appropriate) Lying  Pulse Rate 93  Pulse Rate Source Monitor  ECG Heart Rate 93  Resp 18  Oxygen  Therapy  SpO2 95 %  During Treatment Monitoring  Blood Flow Rate (mL/min) 0 mL/min  Arterial Pressure (mmHg) -20.8 mmHg  Venous Pressure (mmHg) 262.61 mmHg  TMP (mmHg) 25.05 mmHg  Ultrafiltration Rate (mL/min) 1326 mL/min  Dialysate Flow Rate (mL/min) 300 ml/min  Dialysate Potassium Concentration 2  Dialysate Calcium  Concentration 2.5  Duration of HD Treatment -hour(s) 2.73 hour(s)  Cumulative Fluid Removed (mL) per Treatment  2141.88  HD Safety Checks Performed Yes  Intra-Hemodialysis Comments Progressing as prescribed  Bolus Amount (mL) 300 mL  Post Treatment  Dialyzer Clearance Lightly streaked  Liters Processed 65.5  Fluid Removed (mL) 2100 mL  Tolerated HD Treatment Yes  AVG/AVF Arterial Site Held (minutes) 9 minutes  AVG/AVF Venous Site Held (minutes) 8 minutes  Note  Patient Observations pt stable  Fistula / Graft Right Upper arm Arteriovenous fistula  Placement Date/Time: 10/29/19 1319   Placed prior to admission: No  Orientation: Right  Access Location: Upper arm  Access Type: (c) Arteriovenous fistula  Site Condition No complications  Fistula / Graft Assessment Present;Thrill;Bruit  Status Patent;Deaccessed  Needle Size 15  Drainage Description None   Blood return 15 minutes early due to clotting.  2100 ml net fluid removal.

## 2023-12-25 DIAGNOSIS — Z79899 Other long term (current) drug therapy: Secondary | ICD-10-CM

## 2023-12-25 DIAGNOSIS — Z95828 Presence of other vascular implants and grafts: Secondary | ICD-10-CM

## 2023-12-25 DIAGNOSIS — I5033 Acute on chronic diastolic (congestive) heart failure: Secondary | ICD-10-CM

## 2023-12-25 DIAGNOSIS — Z992 Dependence on renal dialysis: Secondary | ICD-10-CM

## 2023-12-25 DIAGNOSIS — N186 End stage renal disease: Secondary | ICD-10-CM

## 2023-12-25 DIAGNOSIS — J189 Pneumonia, unspecified organism: Secondary | ICD-10-CM

## 2023-12-25 DIAGNOSIS — E119 Type 2 diabetes mellitus without complications: Secondary | ICD-10-CM

## 2023-12-25 DIAGNOSIS — J9620 Acute and chronic respiratory failure, unspecified whether with hypoxia or hypercapnia: Secondary | ICD-10-CM

## 2023-12-25 DIAGNOSIS — E875 Hyperkalemia: Secondary | ICD-10-CM | POA: Diagnosis not present

## 2023-12-25 DIAGNOSIS — Z7982 Long term (current) use of aspirin: Secondary | ICD-10-CM

## 2023-12-25 DIAGNOSIS — S36119A Unspecified injury of liver, initial encounter: Secondary | ICD-10-CM

## 2023-12-25 DIAGNOSIS — A419 Sepsis, unspecified organism: Secondary | ICD-10-CM | POA: Diagnosis not present

## 2023-12-25 DIAGNOSIS — J9601 Acute respiratory failure with hypoxia: Secondary | ICD-10-CM

## 2023-12-25 DIAGNOSIS — Z87891 Personal history of nicotine dependence: Secondary | ICD-10-CM

## 2023-12-25 LAB — GLUCOSE, CAPILLARY
Glucose-Capillary: 86 mg/dL (ref 70–99)
Glucose-Capillary: 97 mg/dL (ref 70–99)
Glucose-Capillary: 99 mg/dL (ref 70–99)
Glucose-Capillary: 196 mg/dL — ABNORMAL HIGH (ref 70–99)
Glucose-Capillary: 251 mg/dL — ABNORMAL HIGH (ref 70–99)
Glucose-Capillary: 107 mg/dL — ABNORMAL HIGH (ref 70–99)

## 2023-12-25 LAB — CBC
HCT: 36.5 % — ABNORMAL LOW (ref 39.0–52.0)
Hemoglobin: 11.9 g/dL — ABNORMAL LOW (ref 13.0–17.0)
MCH: 32.9 pg (ref 26.0–34.0)
MCHC: 32.6 g/dL (ref 30.0–36.0)
MCV: 100.8 fL — ABNORMAL HIGH (ref 80.0–100.0)
Platelets: 110 K/uL — ABNORMAL LOW (ref 150–400)
RBC: 3.62 MIL/uL — ABNORMAL LOW (ref 4.22–5.81)
RDW: 16.2 % — ABNORMAL HIGH (ref 11.5–15.5)
WBC: 10.6 K/uL — ABNORMAL HIGH (ref 4.0–10.5)
nRBC: 1 % — ABNORMAL HIGH (ref 0.0–0.2)

## 2023-12-25 LAB — RENAL FUNCTION PANEL
Albumin: 3 g/dL — ABNORMAL LOW (ref 3.5–5.0)
Anion gap: 20 — ABNORMAL HIGH (ref 5–15)
BUN: 33 mg/dL — ABNORMAL HIGH (ref 6–20)
CO2: 20 mmol/L — ABNORMAL LOW (ref 22–32)
Calcium: 9.6 mg/dL (ref 8.9–10.3)
Chloride: 93 mmol/L — ABNORMAL LOW (ref 98–111)
Creatinine, Ser: 5.38 mg/dL — ABNORMAL HIGH (ref 0.61–1.24)
GFR, Estimated: 12 mL/min — ABNORMAL LOW (ref >60)
Glucose, Bld: 117 mg/dL — ABNORMAL HIGH (ref 70–99)
Phosphorus: 5.9 mg/dL — ABNORMAL HIGH (ref 2.5–4.6)
Potassium: 5 mmol/L (ref 3.5–5.1)
Sodium: 133 mmol/L — ABNORMAL LOW (ref 135–145)

## 2023-12-25 MED ORDER — INSULIN ASPART 100 UNIT/ML IJ SOLN
0.0000 [IU] | Freq: Every day | INTRAMUSCULAR | Status: DC
  Administered 2023-12-25: 3 [IU] via SUBCUTANEOUS

## 2023-12-25 MED ORDER — NEPRO/CARBSTEADY PO LIQD
237.0000 mL | ORAL | Status: DC | PRN
Start: 2023-12-25 — End: 2023-12-25

## 2023-12-25 MED ORDER — VANCOMYCIN HCL 500 MG/100ML IV SOLN
500.0000 mg | Freq: Once | INTRAVENOUS | Status: AC
  Administered 2023-12-25: 500 mg via INTRAVENOUS
  Filled 2023-12-25: qty 100

## 2023-12-25 MED ORDER — HEPARIN SODIUM (PORCINE) 1000 UNIT/ML DIALYSIS
1000.0000 [IU] | INTRAMUSCULAR | Status: DC | PRN
Start: 2023-12-26 — End: 2023-12-25

## 2023-12-25 MED ORDER — HEPARIN SODIUM (PORCINE) 1000 UNIT/ML DIALYSIS
1000.0000 [IU] | INTRAMUSCULAR | Status: DC | PRN
Start: 2023-12-25 — End: 2023-12-25

## 2023-12-25 MED ORDER — LIDOCAINE HCL (PF) 1 % IJ SOLN
5.0000 mL | INTRAMUSCULAR | Status: DC | PRN
Start: 2023-12-25 — End: 2023-12-25

## 2023-12-25 MED ORDER — ACETAMINOPHEN 325 MG PO TABS: 650.0000 mg | ORAL_TABLET | Freq: Four times a day (QID) | ORAL | Status: DC

## 2023-12-25 MED ORDER — HEPARIN SODIUM (PORCINE) 1000 UNIT/ML DIALYSIS
3000.0000 [IU] | Freq: Once | INTRAMUSCULAR | Status: AC
  Administered 2023-12-25: 3000 [IU] via INTRAVENOUS_CENTRAL

## 2023-12-25 MED ORDER — ALTEPLASE 2 MG IJ SOLR: 2.0000 mg | Freq: Once | INTRAMUSCULAR | Status: DC | PRN

## 2023-12-25 MED ORDER — KETOROLAC TROMETHAMINE 15 MG/ML IJ SOLN
15.0000 mg | Freq: Once | INTRAMUSCULAR | Status: AC
  Administered 2023-12-25: 15 mg via INTRAVENOUS
  Filled 2023-12-25: qty 1

## 2023-12-25 MED ORDER — LIDOCAINE 5 % EX PTCH
1.0000 | MEDICATED_PATCH | CUTANEOUS | Status: DC
  Administered 2023-12-25 – 2023-12-27 (×3): 1 via TRANSDERMAL
  Filled 2023-12-25 (×3): qty 1

## 2023-12-25 MED ORDER — OXYCODONE-ACETAMINOPHEN 5-325 MG PO TABS
1.0000 | ORAL_TABLET | Freq: Three times a day (TID) | ORAL | Status: DC | PRN
  Administered 2023-12-25 – 2023-12-27 (×5): 1 via ORAL
  Filled 2023-12-25 (×5): qty 1

## 2023-12-25 MED ORDER — LIDOCAINE-PRILOCAINE 2.5-2.5 % EX CREA
1.0000 | TOPICAL_CREAM | CUTANEOUS | Status: DC | PRN
Start: 2023-12-25 — End: 2023-12-25

## 2023-12-25 MED ORDER — ACETAMINOPHEN 325 MG PO TABS: 650.0000 mg | ORAL_TABLET | Freq: Three times a day (TID) | ORAL | Status: DC | PRN

## 2023-12-25 MED ORDER — HEPARIN SODIUM (PORCINE) 1000 UNIT/ML IJ SOLN
INTRAMUSCULAR | Status: AC
  Filled 2023-12-25: qty 1

## 2023-12-25 MED ORDER — ANTICOAGULANT SODIUM CITRATE 4% (200MG/5ML) IV SOLN
5.0000 mL | Status: DC | PRN
Start: 2023-12-25 — End: 2023-12-25

## 2023-12-25 MED ORDER — PENTAFLUOROPROP-TETRAFLUOROETH EX AERO: 1.0000 | INHALATION_SPRAY | CUTANEOUS | Status: DC | PRN

## 2023-12-25 NOTE — Procedures (Signed)
 Received patient in bed to unit.  Alert and oriented.  Informed consent signed and in chart.   TX duration: 3 hours  Patient tolerated well.  Transported back to the room  Alert, without acute distress.  Hand-off given to patient's nurse.   Access used: right arm fistula Access issues: none  Total UF removed: 2.1 liters Medication(s) given: heparin    Greig Silvan, RN Kidney Dialysis Unit

## 2023-12-25 NOTE — Progress Notes (Signed)
 Pharmacy Antibiotic Note  Christopher Davila is a 53 y.o. male admitted on 12/22/2023 with somnolence/hypotension.  Pharmacy has been consulted for vancomycin  dosing for sepsis.  Vancomycin  / cefepime  x 5 days (stop date in for 7/12). Dialysis this AM.   Plan: -Vancomycin  500mg  IV after HD session today  -Follow up inpatient HD schedule   Height: 5' 5 (165.1 cm) Weight: 63.4 kg (139 lb 12.4 oz) IBW/kg (Calculated) : 61.5 Temp (24hrs), Avg:98 F (36.7 C), Min:97 F (36.1 C), Max:98.7 F (37.1 C)  Recent Labs  Lab 12/22/23 2120 12/22/23 2251 12/23/23 0101 12/23/23 0425 12/23/23 0445 12/24/23 0841 12/25/23 0704 12/25/23 0831  WBC 13.5*  --   --   --  11.9* 13.1*  --  10.6*  CREATININE 4.47*  --   --   --  4.70* 3.94* 5.38*  --   LATICACIDVEN  --  4.9* 3.6* 6.3* 7.1*  --   --   --     Estimated Creatinine Clearance: 14.1 mL/min (A) (by C-G formula based on SCr of 5.38 mg/dL (H)).    Allergies  Allergen Reactions   Morphine  Itching    Pt prefers not to be given this drug and Allergic, per MAR    Antimicrobials this admission: Ceftriaxone /Azithro x1 Cefepime  7/8 >> 7/12 Flagyl  7/8 >> 7/9 Vanc 7/8 >> 7/12  7/8 Bcx: NGTD 784 MRSA PCR: +  Thank you for allowing pharmacy to be a part of this patient's care.  Rankin Sams, PharmD, BCPS, BCCCP Clinical Pharmacist

## 2023-12-25 NOTE — Procedures (Signed)
 C/o cramping, UF placed in pause, 100 ml NS bolus given.

## 2023-12-25 NOTE — [place] FL2 (Signed)
 [place]  MEDICAID FL2 LEVEL OF CARE FORM     IDENTIFICATION  Patient Name: Christopher Davila Birthdate: [birth year] Sex: male Admission Date (Current Location): 12/22/2023  [facility] and [place] Number:  Producer, television/film/video and Address:  [facility] [place], [address], [place], [place] [address]      Provider Number: [id number]  Attending Physician Name and Address:  Arlon Carliss ORN, DO  Relative Name and Phone Number:  Laquinn Shippy; Arzella; [phone]    Current Level of Care: Hospital Recommended Level of Care: Skilled Nursing Facility Prior Approval Number:    Date Approved/Denied:   PASRR Number: [id number]  Discharge Plan: SNF    Current Diagnoses: Patient Active Problem List   Diagnosis Date Noted   Liver injury 12/25/2023   Encephalopathy 12/23/2023   Septic shock (HCC) 12/23/2023   Reactive airway disease 12/03/2023   Non-insulin  dependent type 2 diabetes mellitus (HCC) 12/03/2023   Acute on chronic diastolic CHF (congestive heart failure) (HCC) 12/03/2023   Agitation 01/20/2023   MRSA (methicillin resistant Staphylococcus aureus) nasal colonization 12/26/2022   Loculated pleural effusion 12/26/2022   Pneumonia 12/25/2022   Acute metabolic encephalopathy 06/13/2022   Acute encephalopathy 06/12/2022   Pleural effusion, bilateral 06/12/2022   Hyperkalemia 06/12/2022   Chronic respiratory failure with hypoxia (HCC) 06/12/2022   Generalized weakness 11/05/2021   Pain due to onychomycosis of toenails of both feet 07/31/2021   Skin lesion 07/31/2021   Volume overload    History of thoracentesis    Acute respiratory failure with hypoxia (HCC) 03/26/2021   Pressure injury of skin 11/06/2019   ESRD on hemodialysis (HCC)    Heart failure with preserved ejection fraction (HCC)    Goals of care, counseling/discussion    Weakness 10/05/2019   Vision loss of left eye 09/21/2019   Homeless 09/19/2019   Anemia of chronic disease  09/19/2019   Vision loss, left eye 09/19/2019   Acute loss of vision, right 06/16/2019   Alcohol  use    Gastroparesis due to DM (HCC)    Essential hypertension    SOB (shortness of breath)    Hyperlipidemia     Orientation RESPIRATION BLADDER Height & Weight     Self, Situation, Place  O2 (5L nasal cannula) Continent Weight: 139 lb 12.4 oz (63.4 kg) Height:  5' 5 (165.1 cm)  BEHAVIORAL SYMPTOMS/MOOD NEUROLOGICAL BOWEL NUTRITION STATUS      Continent Diet (Please see discharge summary)  AMBULATORY STATUS COMMUNICATION OF NEEDS Skin   Extensive Assist Verbally Normal                       Personal Care Assistance Level of Assistance  Bathing, Feeding, Dressing Bathing Assistance: Maximum assistance Feeding assistance: Maximum assistance Dressing Assistance: Maximum assistance     Functional Limitations Info  Sight Sight Info: Impaired (R and L (Blind))        SPECIAL CARE FACTORS FREQUENCY  PT (By licensed PT), OT (By licensed OT)     PT Frequency: 5x OT Frequency: 5x            Contractures Contractures Info: Not present    Additional Factors Info  Code Status, Allergies, Insulin  Sliding Scale Code Status Info: Full Code Allergies Info: Morphine    Insulin  Sliding Scale Info: Please see discharge summary       Current Medications (12/25/2023):  This is the current hospital active medication list Current Facility-Administered Medications  Medication Dose Route Frequency Provider Last Rate  Last Admin   acetaminophen  (TYLENOL ) tablet 650 mg  650 mg Oral QID Juberg, Christopher, DO   650 mg at 12/24/23 2143   albumin  human 25 % solution 25 g  25 g Intravenous Q1H PRN Geralynn Charleston, MD       albuterol  (PROVENTIL ) (2.5 MG/3ML) 0.083% nebulizer solution 2.5 mg  2.5 mg Nebulization Q4H PRN Keturah Carrier, MD       ceFEPIme  (MAXIPIME ) 1 g in sodium chloride  0.9 % 100 mL IVPB  1 g Intravenous Q24H Harrie Bruckner, DO 200 mL/hr at 12/24/23 2002 1 g at  12/24/23 2002   Chlorhexidine  Gluconate Cloth 2 % PADS 6 each  6 each Topical Q0600 Geralynn Charleston, MD       diclofenac  Sodium (VOLTAREN ) 1 % topical gel 2 g  2 g Topical QID Juberg, Christopher, DO       feeding supplement (NEPRO CARB STEADY) liquid 237 mL  237 mL Oral BID BM Byrum, Robert S, MD   237 mL at 12/24/23 1037   gabapentin  (NEURONTIN ) capsule 300 mg  300 mg Oral QHS Olalere, Adewale A, MD   300 mg at 12/24/23 2001   heparin  injection 5,000 Units  5,000 Units Subcutaneous Q12H Layman Raisin, DO   5,000 Units at 12/25/23 1159   midodrine  (PROAMATINE ) tablet 5 mg  5 mg Oral TID WC Segars, Jonathan, MD   5 mg at 12/25/23 1201   mupirocin  ointment (BACTROBAN ) 2 % 1 Application  1 Application Nasal BID Olalere, Adewale A, MD   1 Application at 12/24/23 2007   oxyCODONE -acetaminophen  (PERCOCET/ROXICET) 5-325 MG per tablet 1 tablet  1 tablet Oral Q6H PRN Olalere, Adewale A, MD   1 tablet at 12/25/23 1201   vancomycin  (VANCOREADY) IVPB 500 mg/100 mL  500 mg Intravenous Once Calkins, Derek W, DO       vancomycin  variable dose per unstable renal function (pharmacist dosing)   Does not apply See admin instructions Laron Agent, [facility]         Discharge Medications: Please see discharge summary for a list of discharge medications.  Relevant Imaging Results:  Relevant Lab Results:   Additional Information [id number] HD at [facility] on MWF 11:45 chair time  Lauraine FORBES Saa, LCSW

## 2023-12-25 NOTE — Progress Notes (Signed)
 1456 Abrupt decr in O2 sat, see note on vital sign tab spO2.

## 2023-12-25 NOTE — TOC Progression Note (Signed)
 Transition of Care [facility]) - Progression Note    Patient Details  Name: Christopher Davila MRN: [id number] Date of Birth: January 01, 1971  [facility]) CM/SW Contact  Lauraine FORBES Saa, LCSW Phone Number: 12/25/2023, 12:51 PM  Clinical Narrative:     12:51 PM CSW sent fl2 to [facility] SNF.  Expected Discharge Plan: Long Term Nursing Home Barriers to Discharge: Continued Medical Work up  Expected Discharge Plan and Services In-house Referral: Clinical Social Work   Post Acute Care Choice: Skilled Nursing Facility, Nursing Home Living arrangements for the past 2 months: Skilled Nursing Facility                                       Social Determinants of Health (SDOH) Interventions SDOH Screenings   Food Insecurity: No Food Insecurity (12/04/2023)  Housing: Low Risk  (12/08/2023)  Transportation Needs: No Transportation Needs (12/04/2023)  Utilities: Not At Risk (12/04/2023)  Tobacco Use: Medium Risk (12/03/2023)    Readmission Risk Interventions    12/05/2023    3:04 PM 01/22/2023    1:23 PM 04/06/2021    1:01 PM  Readmission Risk Prevention Plan  Transportation Screening Complete Complete Complete  PCP or Specialist Appt within 3-5 Days Complete    HRI or Home Care Consult Complete    Social Work Consult for Recovery Care Planning/Counseling Complete    Palliative Care Screening Not Applicable    Medication Review Oceanographer) Complete Complete Complete  PCP or Specialist appointment within 3-5 days of discharge  Complete Complete  HRI or Home Care Consult   Complete  SW Recovery Care/Counseling Consult  Complete Complete  Palliative Care Screening   Not Applicable  Skilled Nursing Facility  Complete Complete

## 2023-12-25 NOTE — Procedures (Signed)
 Goal increased to 2.75 liters per Dr. Geralynn verbal order

## 2023-12-25 NOTE — Progress Notes (Signed)
 OT Cancellation Note  Patient Details Name: Christopher Davila MRN: [id number] DOB: 12-24-1970   Cancelled Treatment:    Reason Eval/Treat Not Completed: Patient at procedure or test/ unavailable (HD)  [name] 12/25/2023, 9:05 AM

## 2023-12-25 NOTE — Plan of Care (Signed)
  Problem: Education: Goal: Knowledge of General Education information will improve Description: Including pain rating scale, medication(s)/side effects and non-pharmacologic comfort measures Outcome: Progressing   Problem: Clinical Measurements: Goal: Diagnostic test results will improve Outcome: Progressing Goal: Respiratory complications will improve Outcome: Progressing   Problem: Clinical Measurements: Goal: Respiratory complications will improve Outcome: Progressing   Problem: Pain Managment: Goal: General experience of comfort will improve and/or be controlled Outcome: Progressing   Problem: Safety: Goal: Ability to remain free from injury will improve Outcome: Progressing

## 2023-12-25 NOTE — Progress Notes (Signed)
 TRH night cross cover note:   Low dose at bedtime SSI coverage added.     Eva Pore, DO Hospitalist

## 2023-12-25 NOTE — Progress Notes (Addendum)
 Progress Note   [name] FMW:[id number] DOB: [birth year] DOA: 12/22/2023  DOS: the patient was seen and examined on 12/25/2023   Brief hospital course:  53 y.o. male with hx of ESRD on MWF HD, RV failure with severe pulm hypertension by echo, LV diastolic failure, diabetes type 2, reactive airway disease, ? Neuromuscular disorder with chronic fasciculations, who was brought in from SNF due to altered mental status and hypotension.   Assessment and Plan:  Acute on chronic hypoxic respiratory failure - Likely from pulmonary edema/volume overload.  Appears to be showing improvement after HD/UF.  Attempt to wean O2 as tolerated.  Initially on 5 L nasal cannula, attempting to wean down to room air today (previous baseline 2 L)..  Empiric antibiotics on board.  Mixed septic and cardiogenic shock - Hypotensive, encephalopathic, with possible pneumonia on presentation.  Initially requiring vasopressor medications and CRRT.  Subsequently shock appears resolved at this time.  Transitioned out of ICU 7/9.  Acute metabolic encephalopathy/hepatic encephalopathy - Appears to be resolved at this time.  Acute liver injury - Hepatocellular etiology initially likely from congestive otology.  She was to be improving.  Recheck LFTs in AM.  Acute HFpEF - Secondary to volume overload.  HD/UF per nephrology.  Showing improvement.  ESRD - Normal regiment MWF.  Volume overloaded on presentation.  Nephrology consulted and following closely.  Severe pulmonary hypertension with right heart failure - Appears to be showing improvement with UF/fluid removal.  Likely secondary to ongoing diastolic dysfunction and volume overload.  Would consider V/Q in the future as well as possible sleep study.  Possible community-acquired pneumonia - May be contributing to patient's hypoxia on presentation.  Currently on broad-spectrum antibiotic coverage empirically.  Monitor respiratory  function.  Hyperkalemia - Showing marked improvement after HD and Lokelma .  Diabetes mellitus - Insulin  sliding scale on board.  Microcytic microcytic anemia and thrombocytopenia - Monitoring.  Recheck CBC in AM.  Chronic neuromuscular disease with chronic fasciculations - Noted  Goals of care - Eventual goal to return to facility.   Subjective: Patient resting comfortably in HD session this morning.  States he feels a bit improved.  Denies ongoing fevers, chills, chest pain, nausea, vomiting, abdominal pain.  Physical Exam:  Vitals:   12/25/23 1049 12/25/23 1100 12/25/23 1114 12/25/23 1115  BP: 98/70 109/78 107/78 105/82  Pulse: 90 88 87 88  Resp: 10 14 19 13   Temp:    97.8 F (36.6 C)  TempSrc:      SpO2: 98% 98% 98% 99%  Weight:    63.4 kg  Height:        GENERAL:  Alert, pleasant, no acute distress  HEENT: Blind, nasal cannula CARDIOVASCULAR:  RRR, no murmurs appreciated RESPIRATORY:  Clear to auscultation, no wheezing, rales, or rhonchi GASTROINTESTINAL:  Soft, nontender, nondistended EXTREMITIES:  No LE edema bilaterally NEURO:  No new focal deficits appreciated SKIN:  No rashes noted PSYCH:  Appropriate mood and affect     Data Reviewed:  No new imaging to review  Previous records (including but not limited to H&P, progress notes, nursing notes, TOC management) were reviewed in assessment of this patient.  Labs: CBC: Recent Labs  Lab 12/22/23 2120 12/23/23 0101 12/23/23 0423 12/23/23 0445 12/24/23 0841 12/25/23 0831  WBC 13.5*  --   --  11.9* 13.1* 10.6*  NEUTROABS 11.8*  --   --   --   --   --   HGB 12.3* 12.6* 13.9 12.4* 11.7* 11.9*  HCT 39.0 37.0* 41.0 39.7 35.0* 36.5*  MCV 103.2*  --   --  104.2* 100.3* 100.8*  PLT 100*  --   --  122* 110* 110*   Basic Metabolic Panel: Recent Labs  Lab 12/22/23 2120 12/22/23 2241 12/23/23 0101 12/23/23 0423 12/23/23 0445 12/23/23 1048 12/23/23 2217 12/24/23 0210 12/24/23 0841 12/24/23 0842  12/25/23 0704  NA 133*  --  135 135 136  --   --   --  135  --  133*  K 6.8*  --  4.7 5.4* 5.3*   < > 4.5 4.8 3.6 3.7 5.0  CL 92*  --   --   --  97*  --   --   --  94*  --  93*  CO2 23  --   --   --  19*  --   --   --  23  --  20*  GLUCOSE 76  --   --   --  185*  --   --   --  75  --  117*  BUN 23*  --   --   --  29*  --   --   --  20  --  33*  CREATININE 4.47*  --   --   --  4.70*  --   --   --  3.94*  --  5.38*  CALCIUM  9.0  --   --   --  9.8  --   --   --  9.4  --  9.6  MG  --  2.2  --   --  2.2  --   --   --   --   --   --   PHOS  --   --   --   --  5.0*  --   --   --   --   --  5.9*   < > = values in this interval not displayed.   Liver Function Tests: Recent Labs  Lab 12/22/23 2120 12/23/23 0445 12/24/23 0841 12/25/23 0704  AST 234* 160* 312*  --   ALT 181* 149* 310*  --   ALKPHOS 95 78 81  --   BILITOT 2.4* 1.7* 2.0*  --   PROT 7.6 6.6 7.3  --   ALBUMIN  3.5 3.0* 3.4* 3.0*   CBG: Recent Labs  Lab 12/24/23 1952 12/24/23 2327 12/24/23 2342 12/25/23 0343 12/25/23 0718  GLUCAP 108* 115* 117* 86 97    Scheduled Meds:  acetaminophen   650 mg Oral QID   Chlorhexidine  Gluconate Cloth  6 each Topical Q0600   diclofenac  Sodium  2 g Topical QID   feeding supplement (NEPRO CARB STEADY)  237 mL Oral BID BM   gabapentin   300 mg Oral QHS   heparin  injection (subcutaneous)  5,000 Units Subcutaneous Q12H   midodrine   5 mg Oral TID WC   mupirocin  ointment  1 Application Nasal BID   vancomycin  variable dose per unstable renal function (pharmacist dosing)   Does not apply See admin instructions   Continuous Infusions:  albumin  human     ceFEPime  (MAXIPIME ) IV 1 g (12/24/23 2002)   vancomycin      PRN Meds:.albumin  human, albuterol , oxyCODONE -acetaminophen   Family Communication: None at bedside  Disposition: Status is: Inpatient Remains inpatient appropriate because: Respiratory failure, ESRD     Time spent: 52 minutes  Length of inpatient stay: 2  days  Author: Carliss LELON Canales, DO 12/25/2023 12:18 PM  For  on call review [email or web address].

## 2023-12-25 NOTE — Progress Notes (Signed)
 [facility] Progress Note  Subjective:  Seen in HD unit   Vitals:   12/25/23 1049 12/25/23 1100 12/25/23 1114 12/25/23 1115  BP: 98/70 109/78 107/78 105/82  Pulse: 90 88 87 88  Resp: 10 14 19 13   Temp:    97.8 F (36.6 C)  TempSrc:      SpO2: 98% 98% 98% 99%  Weight:    63.4 kg  Height:        Exam: General: Sitting up in bed, nad, on nasal oxygen   Lungs: Clear bilaterally. Normal WOB  Heart: RRR, no murmur, rub, or gallop  Abdomen: soft non-tender  Lower extremities: no pitting edema Neuro: A & O X 3. Moves all extremities spontaneously. Psych:  Responds to questions appropriately with a normal affect. Dialysis Access: LUE AVF +bruit     OP HD: [facility] MWF 4h  62kg  B400  2K  AVF  Heparin  3000 Hectorol  6 micrograms qhd   Assessment/ Plan: Sepsis: Sp IVF bolus. Possible pna. BCx's negative, low grade fevers stable and wbc down. On IV abx. Per pmd Acute/chronic hypoxemic respiratory failure: per pt is on 2L [place] at home / SNF. Per CT +ground glass changes c/w edema. Had HD here Tuesday and today, 2.1 L off w/ each session. 1kg over dry after HD this am. Remains on [place] Acute encephalopathy: Related to above. Improved.  ESRD: HD MWF. Had full HD Monday. Had HD here Tuesday and is on HD today off schedule. Next HD Sat.  Access: sig concern for recirculation of AVF at OP unit, consulting VVS to eval for fistulogram. Hypotension: taking 2-3 bp lowering meds at home. On hold for now.  Anemia: Hgb >12. Not on  ESA  Dispo: came from [facility] MD  [facility] 12/25/2023, 12:29 PM  Recent Labs  Lab 12/23/23 0445 12/23/23 1048 12/24/23 0841 12/24/23 0842 12/25/23 0704 12/25/23 0831  HGB 12.4*  --  11.7*  --   --  11.9*  ALBUMIN  3.0*  --  3.4*  --  3.0*  --   CALCIUM  9.8  --  9.4  --  9.6  --   PHOS 5.0*  --   --   --  5.9*  --   CREATININE 4.70*  --  3.94*  --  5.38*  --   K 5.3*   < > 3.6 3.7 5.0  --    < > = values in this interval not displayed.   No  results for input(s): IRON, TIBC, FERRITIN in the last 168 hours. Inpatient medications:  acetaminophen   650 mg Oral QID   Chlorhexidine  Gluconate Cloth  6 each Topical Q0600   diclofenac  Sodium  2 g Topical QID   feeding supplement (NEPRO CARB STEADY)  237 mL Oral BID BM   gabapentin   300 mg Oral QHS   heparin  injection (subcutaneous)  5,000 Units Subcutaneous Q12H   midodrine   5 mg Oral TID WC   mupirocin  ointment  1 Application Nasal BID   vancomycin  variable dose per unstable renal function (pharmacist dosing)   Does not apply See admin instructions    albumin  human     ceFEPime  (MAXIPIME ) IV 1 g (12/24/23 2002)   vancomycin      albumin  human, albuterol , oxyCODONE -acetaminophen 

## 2023-12-25 NOTE — Hospital Course (Addendum)
 53 y.o. male with hx of ESRD on MWF HD, RV failure with severe pulm hypertension by echo, LV diastolic failure, diabetes type 2, reactive airway disease, ? Neuromuscular disorder with chronic fasciculations, who was brought in from SNF due to altered mental status and hypotension.    Assessment and Plan:   Acute on chronic hypoxic respiratory failure - Likely from PNA with contributions of pulmonary edema/volume overload.  Appears to be showing improvement initially after HD/UF.  That showed some improvement yesterday however this morning back up to 6 L nasal cannula (previous baseline 2 L).  Will continue to wean O2 as tolerated.   Mixed septic and cardiogenic shock - Hypotensive, encephalopathic, with possible pneumonia on presentation.  Initially requiring vasopressor medications and CRRT.  Subsequently shock appears resolved at this time.  Transitioned out of ICU 7/9.   Possible community-acquired pneumonia - Likely contributing to patient's hypoxia.  Currently on broad-spectrum antibiotic coverage empirically.  Monitor respiratory function.   Acute metabolic encephalopathy/hepatic encephalopathy - Appears to be resolved at this time.   Acute liver injury - Hepatocellular etiology initially likely from congestive otology.  Seems to be improving.  Recheck LFTs in AM.   Acute HFpEF - Secondary to volume overload.  HD/UF per nephrology.  Showing improvement.   ESRD - Normal regiment MWF.  Volume overloaded on presentation.  Nephrology consulted and following closely.   Severe pulmonary hypertension with right heart failure - Appears to be showing improvement with UF/fluid removal.  Likely secondary to ongoing CAP with diastolic dysfunction and volume overload.  Would consider V/Q in the future as well as possible sleep study.   Hyperkalemia - Showing marked improvement after HD and Lokelma .   Diabetes mellitus - Insulin  sliding scale on board.   Microcytic microcytic anemia and  thrombocytopenia - Monitoring.  Recheck CBC in AM.   Chronic neuromuscular disease with chronic fasciculations - Noted   Goals of care - Eventual goal to return to facility.

## 2023-12-25 NOTE — Consult Note (Addendum)
 Hospital Consult    Reason for Consult: malfunction of AV fistula, fistulogram Requesting Physician:  Dr. Geralynn MRN #:  [id number]  History of Present Illness: This is a 53 y.o. male with pertinent past medical history of Type II DM, HTN, CHF, blindness, and ESRD on HD MWF via a right brachiobasilic AV fistula. He has had this fistula since May of 2021. Vascular was consulted for evaluation of fistula with request for fistulogram due to malfunctioning at outpatient HD center. Patient reports no issues with his fistula that he knows of. He says no one has told him that it has not been function poorly. He denies any bleeding. Has not needed to end his HD sessions early.   Past Medical History:  Diagnosis Date   Acute on chronic diastolic CHF (congestive heart failure) (HCC) 09/27/2019   Acute respiratory failure with hypoxia (HCC) 03/26/2021   Acute urinary retention 10/13/2019   Anemia    Blindness 09/04/2019   CHF (congestive heart failure) (HCC)    COVID-19    Diabetes mellitus    Diarrhea 10/14/2019   Epigastric abdominal pain    ESRD (end stage renal disease) (HCC)    Gastroparesis due to DM (HCC)    Hypertension    Hypertensive urgency 04/17/2019   Hypervolemia    Hypoalbuminemia 04/17/2019   Hypoxia    Metabolic acidosis, increased anion gap 10/13/2019   Multifocal pneumonia 05/06/2021   Neuropathy of lower extremity    Oral candidiasis 06/26/2012   Palliative care encounter    Pancreatitis 06/26/2012   Weight loss 06/26/2012    Past Surgical History:  Procedure Laterality Date   A/V SHUNT INTERVENTION N/A 10/21/2023   Procedure: A/V SHUNT INTERVENTION;  Surgeon: Melia Lynwood ORN, MD;  Location: [facility] INVASIVE CV LAB;  Service: Cardiovascular;  Laterality: N/A;  60% baslic vein   AV FISTULA PLACEMENT Right 10/29/2019   Procedure: RIGHT ARM BRACHIOBASILIC ARTERIOVENOUS (AV) FISTULA CREATION;  Surgeon: Oris Krystal FALCON, MD;  Location: [facility] OR;  Service: Vascular;  Laterality: Right;    BASCILIC VEIN TRANSPOSITION Right 01/05/2020   Procedure: RIGHT ARM SECOND STAGE BASCILIC VEIN TRANSPOSITION;  Surgeon: Oris Krystal FALCON, MD;  Location: [facility] OR;  Service: Vascular;  Laterality: Right;   EYE SURGERY     IR FLUORO GUIDE CV LINE RIGHT  10/28/2019   IR US  GUIDE VASC ACCESS RIGHT  10/28/2019    Allergies  Allergen Reactions   Morphine  Itching    Pt prefers not to be given this drug and Allergic, per [facility]    Prior to Admission medications   Medication Sig Start Date End Date Taking? Authorizing Provider  amLODipine  (NORVASC ) 5 MG tablet Take 5-10 mg by mouth See admin instructions. Take 10 mg by mouth on Sun/Tues/Thurs/Sat and 5 mg on Mon/Wed/Fri 05/10/22  Yes [provider]  aspirin  EC 81 MG tablet Take 1 tablet (81 mg total) by mouth daily. 05/24/15  Yes Ghimire, Donalda HERO, MD  brimonidine -timolol  (COMBIGAN ) 0.2-0.5 % ophthalmic solution Place 1 drop into both eyes in the morning and at bedtime.   Yes [provider]  calcium  acetate (PHOSLO ) 667 MG capsule Take 1 capsule (667 mg total) by mouth 3 (three) times daily with meals. 11/19/19  Yes Odell Celinda Balo, MD  cloNIDine  (CATAPRES ) 0.1 MG tablet Take 0.1 mg by mouth every 12 (twelve) hours as needed (Hypertension).   Yes [provider]  Dextromethorphan-guaiFENesin  (MUCINEX  DM MAXIMUM STRENGTH) 60-1200 MG TB12 Take 1 tablet by mouth 2 (two) times daily.  12/11/23 12/25/23 Yes [provider]  famotidine  (PEPCID ) 20 MG tablet Take 20 mg by mouth at bedtime. 05/10/22  Yes [provider]  gabapentin  (NEURONTIN ) 300 MG capsule Take 300 mg by mouth daily.   Yes [provider]  hydrALAZINE  (APRESOLINE ) 25 MG tablet Take 25 mg by mouth 3 (three) times daily.   Yes [provider]  hyoscyamine (ANASPAZ) 0.125 MG TBDP disintergrating tablet Place 0.125 mg under the tongue every 4 (four) hours as needed (excess secretions). Not to exceed 6 doses in 24 hours.   Yes [provider]  latanoprost  (XALATAN ) 0.005 % ophthalmic solution Place 1 drop into the left eye at bedtime. 09/23/19  Yes Vann, Jessica U, DO  losartan  (COZAAR ) 100 MG tablet Take 1 tablet (100 mg total) by mouth daily. 11/19/19  Yes Odell Celinda Balo, MD  Menthol, Topical Analgesic, 5 % GEL Apply 1 application  topically every 8 (eight) hours as needed (for pain- affected areas).   Yes [provider]  metolazone  (ZAROXOLYN ) 5 MG tablet Take 5 mg by mouth at bedtime. 05/10/22  Yes [provider]  Nutritional Supplements (NOVASOURCE RENAL) LIQD Take 237 mLs by mouth every Monday, Wednesday, and Friday.   Yes [provider]  ondansetron  (ZOFRAN ) 4 MG tablet Take 4 mg by mouth every 6 (six) hours as needed for nausea or vomiting.   Yes [provider]  oxyCODONE -acetaminophen  (PERCOCET/ROXICET) 5-325 MG tablet Take 1 tablet by mouth See admin instructions. Take 1 tablet by mouth at 3 AM, 9 AM, 3 PM, and 9 PM   Yes [provider]  OXYGEN  Inhale 2 L/min into the lungs continuous.   Yes [provider]  polyethylene glycol (MIRALAX  / GLYCOLAX ) 17 g packet Take 17 g by mouth every 12 (twelve) hours as needed (for constipation).   Yes [provider]  tamsulosin  (FLOMAX ) 0.4 MG CAPS capsule Take 0.4 mg by mouth daily.   Yes [provider]  torsemide  (DEMADEX ) 100 MG tablet Take 100 mg by mouth 2 (two) times daily.   Yes [provider]  guaiFENesin  (MUCINEX ) 600 MG 12 hr tablet Take 1 tablet (600 mg total) by mouth 2 (two) times daily. Patient not taking: Reported on 12/23/2023 12/08/23   Dino Antu, MD    Social History   Socioeconomic History   Marital status: Single    Spouse name: Not on file   Number of children: Not on file   Years of education: Not on file   Highest education level: Not on file  Occupational History   Occupation: dish washer, unemployed  Tobacco Use   Smoking status: Former    Current  packs/day: 0.00    Types: Cigarettes    Quit date: 03/22/2015    Years since quitting: 8.7   Smokeless tobacco: Never  Vaping Use   Vaping status: Never Used  Substance and Sexual Activity   Alcohol  use: Not Currently   Drug use: No   Sexual activity: Not on file  Other Topics Concern   Not on file  Social History Narrative   Not on file   Social Drivers of Health   Financial Resource Strain: Not on file  Food Insecurity: No Food Insecurity (12/04/2023)   Hunger Vital Sign    Worried About Running Out of Food in the Last Year: Never true    Ran Out of Food in the Last Year: Never true  Transportation Needs: No Transportation Needs (12/04/2023)   PRAPARE -  Administrator, Civil Service (Medical): No    Lack of Transportation (Non-Medical): No  Physical Activity: Not on file  Stress: Not on file  Social Connections: Not on file  Intimate Partner Violence: Not At Risk (12/04/2023)   Humiliation, Afraid, Rape, and Kick questionnaire    Fear of Current or Ex-Partner: No    Emotionally Abused: No    Physically Abused: No    Sexually Abused: No     Family History  Problem Relation Age of Onset   Diabetes Mother    Lung disease Mother    Hypertension Mother    Diabetes Maternal Aunt    CAD Maternal Aunt    CAD Cousin     ROS: Otherwise negative unless mentioned in HPI  Physical Examination  Vitals:   12/25/23 1115 12/25/23 1244  BP: 105/82   Pulse: 88   Resp: 13   Temp: 97.8 F (36.6 C) 98.2 F (36.8 C)  SpO2: 99%    Body mass index is 23.26 kg/m.  General:  WDWN in NAD Gait: Not observed HENT: WNL, normocephalic Pulmonary: normal non-labored breathing, without wheezing Cardiac: regular Vascular Exam/Pulses: right AV fistula with great thrill Extremities: without ischemic changes, without Gangrene , without cellulitis; without open wounds;  Musculoskeletal: no muscle wasting or atrophy  Neurologic: A&O X 3;  No focal weakness or paresthesias  are detected; speech is fluent/normal Psychiatric:  The pt has Normal affect. Lymph:  Unremarkable  CBC    Component Value Date/Time   WBC 10.6 (H) 12/25/2023 0831   RBC 3.62 (L) 12/25/2023 0831   HGB 11.9 (L) 12/25/2023 0831   HCT 36.5 (L) 12/25/2023 0831   PLT 110 (L) 12/25/2023 0831   MCV 100.8 (H) 12/25/2023 0831   MCH 32.9 12/25/2023 0831   MCHC 32.6 12/25/2023 0831   RDW 16.2 (H) 12/25/2023 0831   LYMPHSABS 0.8 12/22/2023 2120   MONOABS 0.7 12/22/2023 2120   EOSABS 0.0 12/22/2023 2120   BASOSABS 0.0 12/22/2023 2120    BMET    Component Value Date/Time   NA 133 (L) 12/25/2023 0704   K 5.0 12/25/2023 0704   CL 93 (L) 12/25/2023 0704   CO2 20 (L) 12/25/2023 0704   GLUCOSE 117 (H) 12/25/2023 0704   BUN 33 (H) 12/25/2023 0704   CREATININE 5.38 (H) 12/25/2023 0704   CALCIUM  9.6 12/25/2023 0704   GFRNONAA 12 (L) 12/25/2023 0704   GFRAA 27 (L) 11/19/2019 0435    COAGS: Lab Results  Component Value Date   INR 1.7 (H) 12/22/2023   INR 1.1 05/07/2022   INR 1.1 10/13/2019     Non-Invasive Vascular Imaging:   none  Statin:  No. Beta Blocker:  No. Aspirin :  Yes.   ACEI:  No. ARB:  Yes.   CCB use:  Yes Other antiplatelets/anticoagulants:  No.    ASSESSMENT/PLAN: This is a 53 y.o. male with ESRD on HD with right AV fistula. Vascular was consulted for evaluation of fistula. Some report of malfunction at his outpatient HD center. Patient is unaware of any issues with his fistula. Fistula on exam feels great with great thrill. Fistula has been functioning well while inpatient. We have no availability today or tomorrow for fistulogram. We will arrange RUE Fistulogram as outpatient next week or so to further evaluation   Teretha Damme PA-C [facility] [phone] 12/25/2023  12:58 PM  I have seen and evaluated the patient. I agree with the PA note as documented above.  53 year old male with end-stage renal disease admitted with hypotension and  altered mental status from his SNF.  Found to have acute on chronic respiratory failure with some mixed shock picture.  We have been consulted for possible fistulogram.  Right arm basilic fistula has an excellent thrill on exam.  He tells me that it is working well.  There is no time tomorrow as the OR schedule is full.  Will arrange as an outpatient on [employer] in our outpatient lab as I do not see an obvious indication to do this during this admission.  Lonni DOROTHA Gaskins, MD [facility] [place] Office: [phone]

## 2023-12-26 ENCOUNTER — Other Ambulatory Visit: Payer: Self-pay

## 2023-12-26 DIAGNOSIS — J9601 Acute respiratory failure with hypoxia: Secondary | ICD-10-CM | POA: Diagnosis not present

## 2023-12-26 DIAGNOSIS — E875 Hyperkalemia: Secondary | ICD-10-CM | POA: Diagnosis not present

## 2023-12-26 DIAGNOSIS — A419 Sepsis, unspecified organism: Secondary | ICD-10-CM | POA: Diagnosis not present

## 2023-12-26 DIAGNOSIS — I5033 Acute on chronic diastolic (congestive) heart failure: Secondary | ICD-10-CM | POA: Diagnosis not present

## 2023-12-26 LAB — CBC
HCT: 42.5 % (ref 39.0–52.0)
Hemoglobin: 13.4 g/dL (ref 13.0–17.0)
MCH: 32.8 pg (ref 26.0–34.0)
MCHC: 31.5 g/dL (ref 30.0–36.0)
MCV: 103.9 fL — ABNORMAL HIGH (ref 80.0–100.0)
Platelets: 92 K/uL — ABNORMAL LOW (ref 150–400)
RBC: 4.09 MIL/uL — ABNORMAL LOW (ref 4.22–5.81)
RDW: 17.3 % — ABNORMAL HIGH (ref 11.5–15.5)
WBC: 10.4 K/uL (ref 4.0–10.5)
nRBC: 1.6 % — ABNORMAL HIGH (ref 0.0–0.2)

## 2023-12-26 LAB — COMPREHENSIVE METABOLIC PANEL WITH GFR
ALT: 408 U/L — ABNORMAL HIGH (ref 0–44)
AST: 248 U/L — ABNORMAL HIGH (ref 15–41)
Albumin: 3.5 g/dL (ref 3.5–5.0)
Alkaline Phosphatase: 103 U/L (ref 38–126)
Anion gap: 19 — ABNORMAL HIGH (ref 5–15)
BUN: 26 mg/dL — ABNORMAL HIGH (ref 6–20)
CO2: 22 mmol/L (ref 22–32)
Calcium: 10.2 mg/dL (ref 8.9–10.3)
Chloride: 91 mmol/L — ABNORMAL LOW (ref 98–111)
Creatinine, Ser: 4.87 mg/dL — ABNORMAL HIGH (ref 0.61–1.24)
GFR, Estimated: 14 mL/min — ABNORMAL LOW (ref >60)
Glucose, Bld: 109 mg/dL — ABNORMAL HIGH (ref 70–99)
Potassium: 4.6 mmol/L (ref 3.5–5.1)
Sodium: 132 mmol/L — ABNORMAL LOW (ref 135–145)
Total Bilirubin: 2.1 mg/dL — ABNORMAL HIGH (ref 0.0–1.2)
Total Protein: 8 g/dL (ref 6.5–8.1)

## 2023-12-26 LAB — GLUCOSE, CAPILLARY
Glucose-Capillary: 79 mg/dL (ref 70–99)
Glucose-Capillary: 87 mg/dL (ref 70–99)
Glucose-Capillary: 110 mg/dL — ABNORMAL HIGH (ref 70–99)
Glucose-Capillary: 158 mg/dL — ABNORMAL HIGH (ref 70–99)
Glucose-Capillary: 169 mg/dL — ABNORMAL HIGH (ref 70–99)

## 2023-12-26 LAB — MAGNESIUM: Magnesium: 2.4 mg/dL (ref 1.7–2.4)

## 2023-12-26 LAB — PHOSPHORUS: Phosphorus: 4.3 mg/dL (ref 2.5–4.6)

## 2023-12-26 MED ORDER — INSULIN ASPART 100 UNIT/ML IJ SOLN: 0.0000 [IU] | Freq: Three times a day (TID) | INTRAMUSCULAR | Status: DC

## 2023-12-26 MED ORDER — CHLORHEXIDINE GLUCONATE CLOTH 2 % EX PADS
6.0000 | MEDICATED_PAD | Freq: Every day | CUTANEOUS | Status: DC
  Administered 2023-12-27: 6 via TOPICAL

## 2023-12-26 NOTE — Progress Notes (Signed)
 TRH night cross cover note:  I changed this pt's cbg monitoring from q4h to achs with associated sliding scale coverage.     Eva Pore, DO Hospitalist

## 2023-12-26 NOTE — TOC Progression Note (Signed)
 Transition of Care [facility]) - Progression Note    Patient Details  Name: Christopher Davila MRN: [id number] Date of Birth: [birth year]  Transition of Care [facility]) CM/SW Contact  Lauraine FORBES Saa, LCSW Phone Number: 12/26/2023, 11:00 AM  Clinical Narrative:     11:00 AM Per hospitalist, patient is expected to discharge next week. CSW informed [facility] of expected discharge date.  Expected Discharge Plan: Long Term Nursing Home Barriers to Discharge: Continued Medical Work up  Expected Discharge Plan and Services In-house Referral: Clinical Social Work   Post Acute Care Choice: Skilled Nursing Facility, Nursing Home Living arrangements for the past 2 months: Skilled Nursing Facility                                       Social Determinants of Health (SDOH) Interventions SDOH Screenings   Food Insecurity: No Food Insecurity (12/26/2023)  Housing: Low Risk  (12/26/2023)  Transportation Needs: No Transportation Needs (12/26/2023)  Utilities: Not At Risk (12/26/2023)  Tobacco Use: Medium Risk (12/03/2023)    Readmission Risk Interventions    12/05/2023    3:04 PM 01/22/2023    1:23 PM 04/06/2021    1:01 PM  Readmission Risk Prevention Plan  Transportation Screening Complete Complete Complete  PCP or Specialist Appt within 3-5 Days Complete    HRI or Home Care Consult Complete    Social Work Consult for Recovery Care Planning/Counseling Complete    Palliative Care Screening Not Applicable    Medication Review Oceanographer) Complete Complete Complete  PCP or Specialist appointment within 3-5 days of discharge  Complete Complete  HRI or Home Care Consult   Complete  SW Recovery Care/Counseling Consult  Complete Complete  Palliative Care Screening   Not Applicable  Skilled Nursing Facility  Complete Complete

## 2023-12-26 NOTE — Progress Notes (Signed)
 Progress Note   [name] FMW:[id number] DOB: 02/23/1971 DOA: 12/22/2023  DOS: the patient was seen and examined on 12/26/2023   Brief hospital course:  53 y.o. male with hx of ESRD on MWF HD, RV failure with severe pulm hypertension by echo, LV diastolic failure, diabetes type 2, reactive airway disease, ? Neuromuscular disorder with chronic fasciculations, who was brought in from SNF due to altered mental status and hypotension.    Assessment and Plan:   Acute on chronic hypoxic respiratory failure - Likely from PNA with contributions of pulmonary edema/volume overload.  Appears to be showing improvement initially after HD/UF.  That showed some improvement yesterday however this morning back up to 6 L nasal cannula (previous baseline 2 L).  Will continue to wean O2 as tolerated.  Mixed septic and cardiogenic shock - Hypotensive, encephalopathic, with possible pneumonia on presentation.  Initially requiring vasopressor medications and CRRT.  Subsequently shock appears resolved at this time.  Transitioned out of ICU 7/9.  Possible community-acquired pneumonia - Likely contributing to patient's hypoxia.  Currently on broad-spectrum antibiotic coverage empirically.  Monitor respiratory function.   Acute metabolic encephalopathy/hepatic encephalopathy - Appears to be resolved at this time.   Acute liver injury - Hepatocellular etiology initially likely from congestive otology.  Seems to be improving.  Recheck LFTs in AM.   Acute HFpEF - Secondary to volume overload.  HD/UF per nephrology.  Showing improvement.   ESRD - Normal regiment MWF.  Volume overloaded on presentation.  Nephrology consulted and following closely.   Severe pulmonary hypertension with right heart failure - Appears to be showing improvement with UF/fluid removal.  Likely secondary to ongoing CAP with diastolic dysfunction and volume overload.  Would consider V/Q in the future as well as possible  sleep study.   Hyperkalemia - Showing marked improvement after HD and Lokelma .   Diabetes mellitus - Insulin  sliding scale on board.   Microcytic microcytic anemia and thrombocytopenia - Monitoring.  Recheck CBC in AM.   Chronic neuromuscular disease with chronic fasciculations - Noted   Goals of care - Eventual goal to return to facility.   Subjective: Patient resting comfortably this morning.  Had become more hypoxic overnight now requiring 6 L nasal cannula.  States he tolerated breakfast well this morning.  Denies any worsening shortness of breath, chest pain, nausea, vomiting, abdominal pain.  Does state he feels malaise, weak all over.  Physical Exam:  Vitals:   12/26/23 1005 12/26/23 1105 12/26/23 1112 12/26/23 1136  BP:  111/78    Pulse: 80 92 86   Resp: 12 16 15    Temp:    97.6 F (36.4 C)  TempSrc:    Oral  SpO2: 93% 90% 94%   Weight:      Height:        GENERAL:  Alert, pleasant, no acute distress  HEENT: Blind, nasal cannula CARDIOVASCULAR:  RRR, no murmurs appreciated RESPIRATORY:  Clear to auscultation, no wheezing, rales, or rhonchi GASTROINTESTINAL:  Soft, nontender, nondistended EXTREMITIES:  No LE edema bilaterally NEURO:  No new focal deficits appreciated SKIN:  No rashes noted PSYCH:  Appropriate mood and affect    Data Reviewed:  Imaging Studies: ECHOCARDIOGRAM LIMITED Result Date: 12/23/2023    ECHOCARDIOGRAM LIMITED REPORT   Patient Name:   Christopher Davila Date of Exam: 12/23/2023 Medical Rec #:  [id number]            Height:       65.0 in Accession #:  [id number]           Weight:       132.3 lb Date of Birth:  1970-08-26            BSA:          1.659 m Patient Age:    52 years             BP:           107/77 mmHg Patient Gender: M                    HR:           85 bpm. Exam Location:  Inpatient Procedure: Limited Echo, Color Doppler and Cardiac Doppler (Both Spectral and            Color Flow Doppler were utilized during procedure).  Indications:    congestive heart failure  History:        Patient has prior history of Echocardiogram examinations, most                 recent 12/04/2023. End stage renal disease and Pulmonary HTN,                 Signs/Symptoms:Altered Mental Status; Risk Factors:Hypertension                 and Dyslipidemia.  Sonographer:    Tinnie Barefoot RDCS Referring Phys: [id number] JONATHAN SEGARS IMPRESSIONS  1. Left ventricular ejection fraction, by estimation, is 65 to 70%. The left ventricle has normal function. The left ventricle has no regional wall motion abnormalities. There is mild left ventricular hypertrophy. There is the interventricular septum is  flattened in systole and diastole, consistent with right ventricular pressure and volume overload.  2. Right ventricular systolic function is severely reduced. The right ventricular size is severely enlarged. There is severely elevated pulmonary artery systolic pressure. The estimated right ventricular systolic pressure is 68.5 mmHg.  3. Moderate pericardial effusion. The pericardial effusion is posterior to the left ventricle and surrounding the apex.  4. The mitral valve is normal in structure. No evidence of mitral valve regurgitation. No evidence of mitral stenosis.  5. 2D Vena Contracta 11 mm, there are multiple jets. Tricuspid valve regurgitation is severe.  6. The aortic valve is tricuspid. There is mild calcification of the aortic valve. Aortic valve regurgitation is not visualized. Aortic valve sclerosis is present, with no evidence of aortic valve stenosis. Comparison(s): Prior images reviewed side by side. RVSP has improved with no other improvements in RV assessment. FINDINGS  Left Ventricle: Left ventricular ejection fraction, by estimation, is 65 to 70%. The left ventricle has normal function. The left ventricle has no regional wall motion abnormalities. The left ventricular internal cavity size was small. There is mild left ventricular hypertrophy. The  interventricular septum is flattened in systole and diastole, consistent with right ventricular pressure and volume overload. Right Ventricle: The right ventricular size is severely enlarged. Right ventricular systolic function is severely reduced. There is severely elevated pulmonary artery systolic pressure. The tricuspid regurgitant velocity is 3.89 m/s, and with an assumed right atrial pressure of 8 mmHg, the estimated right ventricular systolic pressure is 68.5 mmHg. Right Atrium: Right atrial size was normal in size. Pericardium: A moderately sized pericardial effusion is present. The pericardial effusion is posterior to the left ventricle and surrounding the apex. Mitral Valve: The mitral valve is normal in structure. No evidence of mitral valve stenosis. Tricuspid Valve: 2D  Vena Contracta 11 mm, there are multiple jets. Tricuspid valve regurgitation is severe. Aortic Valve: The aortic valve is tricuspid. There is mild calcification of the aortic valve. There is mild aortic valve annular calcification. Aortic valve regurgitation is not visualized. Aortic valve sclerosis is present, with no evidence of aortic valve stenosis. Pulmonic Valve: The pulmonic valve was normal in structure. Pulmonic valve regurgitation is mild to moderate. No evidence of pulmonic stenosis. Additional Comments: Spectral Doppler performed. Color Doppler performed.  LEFT VENTRICLE PLAX 2D LVIDd:         3.10 cm Diastology LVIDs:         2.00 cm LV e' medial:    6.31 cm/s LV PW:         1.20 cm LV E/e' medial:  9.1 LV IVS:        1.20 cm LV e' lateral:   5.98 cm/s                        LV E/e' lateral: 9.6  RIGHT VENTRICLE            IVC RV S prime:     7.72 cm/s  IVC diam: 1.90 cm LEFT ATRIUM         Index LA diam:    2.70 cm 1.63 cm/m  AORTIC VALVE LVOT Vmax:   75.50 cm/s LVOT Vmean:  46.100 cm/s LVOT VTI:    0.107 m MITRAL VALVE               TRICUSPID VALVE MV Area (PHT): 5.13 cm    TR Peak grad:   60.5 mmHg MV Decel Time: 148  msec    TR Vmax:        389.00 cm/s MV E velocity: 57.60 cm/s MV A velocity: 80.20 cm/s  SHUNTS MV E/A ratio:  0.72        Systemic VTI: 0.11 m Stanly Leavens MD Electronically signed by Stanly Leavens MD Signature Date/Time: 12/23/2023/2:29:09 PM    Final    CT Head Wo Contrast Result Date: 12/23/2023 CLINICAL DATA:  Delirium. EXAM: CT HEAD WITHOUT CONTRAST TECHNIQUE: Contiguous axial images were obtained from the base of the skull through the vertex without intravenous contrast. RADIATION DOSE REDUCTION: This exam was performed according to the departmental dose-optimization program which includes automated exposure control, adjustment of the mA and/or kV according to patient size and/or use of iterative reconstruction technique. COMPARISON:  December 04, 2023 CT head. FINDINGS: Brain: No evidence of acute infarction, hemorrhage, hydrocephalus, extra-axial collection or mass lesion/mass effect. Patchy white matter hypodensities compatible with microvascular ischemic change. Vascular: No hyperdense vessel. Calcific atherosclerosis. No acute fracture. Skull: No acute fracture. Sinuses/Orbits: Mostly clear sinuses. No acute orbital findings. Right pthisis bulbi. Other: No mastoid effusions. IMPRESSION: No evidence of acute intracranial abnormality. Electronically Signed   By: Gilmore GORMAN Molt M.D.   On: 12/23/2023 00:37   CT CHEST ABDOMEN PELVIS W CONTRAST Result Date: 12/23/2023 CLINICAL DATA:  Sepsis.  Altered mental status. EXAM: CT CHEST, ABDOMEN, AND PELVIS WITH CONTRAST TECHNIQUE: Multidetector CT imaging of the chest, abdomen and pelvis was performed following the standard protocol during bolus administration of intravenous contrast. RADIATION DOSE REDUCTION: This exam was performed according to the departmental dose-optimization program which includes automated exposure control, adjustment of the mA and/or kV according to patient size and/or use of iterative reconstruction technique. CONTRAST:   75mL OMNIPAQUE  IOHEXOL  350 MG/ML SOLN COMPARISON:  Chest radiograph 12/22/2023. CT chest abdomen and  pelvis 05/06/2021 FINDINGS: CT CHEST FINDINGS Cardiovascular: Mild cardiac enlargement with right heart dilatation. Reflux of contrast material into the hepatic veins suggest right heart failure. Small pericardial effusion. Normal caliber thoracic aorta. No dissection. Great vessel origins are patent. Central pulmonary arteries are well opacified. No evidence of significant pulmonary embolus. Likely dialysis graft in right arm. Mediastinum/Nodes: Thyroid  gland is unremarkable. Esophagus is decompressed. Small amount of residual contrast material in the esophagus suggesting dysmotility. Prominent mediastinal lymph nodes. Largest left aortopulmonic window nodes measure up to about 1.3 cm short axis dimension. No change since prior study, likely reactive. Lungs/Pleura: Mild diffuse ground-glass opacities throughout both lungs with patchy perihilar infiltrates and areas of atelectasis or consolidation in the lung bases. Changes likely to represent edema with probable superimposed pneumonia. Small right pleural effusion. Pleural effusions are decreased since prior study. Musculoskeletal: No chest wall mass or suspicious bone lesions identified. CT ABDOMEN PELVIS FINDINGS Hepatobiliary: Cholelithiasis with small stones in the gallbladder. Pericholecystic edema may be due to ascites. No bile duct dilatation. No focal liver lesions. Pancreas: Unremarkable. No pancreatic ductal dilatation or surrounding inflammatory changes. Spleen: Normal in size without focal abnormality. Adrenals/Urinary Tract: Adrenal glands are unremarkable. Kidneys are normal, without renal calculi, focal lesion, or hydronephrosis. Bladder wall is thickened, possibly due to under distention or cystitis. Correlate with urinalysis. Stomach/Bowel: Stomach, small bowel, and colon are not abnormally distended. No wall thickening or inflammatory changes.  Stool fills the colon. Appendix is not identified. Vascular/Lymphatic: Calcification of the aorta. No aneurysm. Prominent lymph nodes throughout the retroperitoneum without pathologic enlargement. Appearances are similar to prior study. Reproductive: Prostate gland is enlarged. Other: Moderate diffuse free fluid throughout the abdomen and pelvis consistent with ascites. Mesenteric edema. No free air. Abdominal wall musculature appears intact. Musculoskeletal: No acute or significant osseous findings. IMPRESSION: 1. Cardiac enlargement with right heart dilatation evidence of right heart failure. 2. Diffuse pulmonary ground-glass opacities consistent with edema. Patchy infiltrates or consolidation demonstrated in the perihilar and basilar region likely representing superimposed pneumonia and/or atelectasis. Small right pleural effusion. 3. Nonspecific mediastinal and retroperitoneal lymphadenopathy, unchanged, likely reactive. Cholelithiasis. 4. Moderate abdominal and pelvic ascites. Pericholecystic edema is likely related to ascites. 5. Bladder wall thickening may be due to under distension or cystitis. Correlate with urinalysis. 6. Aortic atherosclerosis. Electronically Signed   By: Elsie Gravely M.D.   On: 12/23/2023 00:37   DG Chest 2 View Result Date: 12/22/2023 CLINICAL DATA:  Sepsis.  Hypotension. EXAM: CHEST - 2 VIEW COMPARISON:  12/03/2023 FINDINGS: Shallow inspiration. Cardiac enlargement. Probable small bilateral pleural effusions. Bilateral hazy perihilar opacities, likely edema but possibly pneumonia. No change since prior study. No pneumothorax. Mediastinal contours appear intact. Surgical clips in the right axilla. IMPRESSION: Cardiac enlargement with bilateral perihilar edema and small effusions, similar to prior study. Electronically Signed   By: Elsie Gravely M.D.   On: 12/22/2023 21:47   ECHOCARDIOGRAM COMPLETE Result Date: 12/04/2023    ECHOCARDIOGRAM REPORT   Patient Name:   JYQUAN KENLEY Date of Exam: 12/04/2023 Medical Rec #:  [id number]            Height:       65.0 in Accession #:    [id number]           Weight:       118.8 lb Date of Birth:  [birth year]            BSA:          1.585 m Patient Age:  52 years             BP:           145/92 mmHg Patient Gender: M                    HR:           92 bpm. Exam Location:  Inpatient Procedure: 2D Echo, Color Doppler and Cardiac Doppler (Both Spectral and Color            Flow Doppler were utilized during procedure). Indications:    CHF  History:        Patient has prior history of Echocardiogram examinations, most                 recent 01/19/2023. Risk Factors:Hypertension.  Sonographer:    Benard Stallion Referring Phys: [id number] SUBRINA SUNDIL IMPRESSIONS  1. Left ventricular ejection fraction, by estimation, is 65 to 70%. The left ventricle has normal function. The left ventricle has no regional wall motion abnormalities. There is mild concentric left ventricular hypertrophy. Left ventricular diastolic parameters are consistent with Grade I diastolic dysfunction (impaired relaxation). There is the interventricular septum is flattened in systole and diastole, consistent with right ventricular pressure and volume overload.  2. Right ventricular systolic function is severely reduced. The right ventricular size is severely enlarged. There is severely elevated pulmonary artery systolic pressure. The estimated right ventricular systolic pressure is 101.9 mmHg.  3. Right atrial size was severely dilated.  4. The mitral valve is normal in structure. No evidence of mitral valve regurgitation. No evidence of mitral stenosis.  5. Tricuspid valve regurgitation is moderate.  6. The aortic valve is tricuspid. There is mild calcification of the aortic valve. Aortic valve regurgitation is not visualized. No aortic stenosis is present.  7. The inferior vena cava is dilated in size with <50% respiratory variability, suggesting right atrial pressure  of 15 mmHg. FINDINGS  Left Ventricle: Left ventricular ejection fraction, by estimation, is 65 to 70%. The left ventricle has normal function. The left ventricle has no regional wall motion abnormalities. The left ventricular internal cavity size was normal in size. There is  mild concentric left ventricular hypertrophy. The interventricular septum is flattened in systole and diastole, consistent with right ventricular pressure and volume overload. Left ventricular diastolic parameters are consistent with Grade I diastolic dysfunction (impaired relaxation). Right Ventricle: The right ventricular size is severely enlarged. No increase in right ventricular wall thickness. Right ventricular systolic function is severely reduced. There is severely elevated pulmonary artery systolic pressure. The tricuspid regurgitant velocity is 4.66 m/s, and with an assumed right atrial pressure of 15 mmHg, the estimated right ventricular systolic pressure is 101.9 mmHg. Left Atrium: Left atrial size was normal in size. Right Atrium: Right atrial size was severely dilated. Pericardium: There is no evidence of pericardial effusion. Mitral Valve: The mitral valve is normal in structure. No evidence of mitral valve regurgitation. No evidence of mitral valve stenosis. Tricuspid Valve: The tricuspid valve is normal in structure. Tricuspid valve regurgitation is moderate . No evidence of tricuspid stenosis. Aortic Valve: The aortic valve is tricuspid. There is mild calcification of the aortic valve. Aortic valve regurgitation is not visualized. No aortic stenosis is present. Aortic valve mean gradient measures 4.0 mmHg. Aortic valve peak gradient measures 6.9 mmHg. Aortic valve area, by VTI measures 1.91 cm. Pulmonic Valve: The pulmonic valve was normal in structure. Pulmonic valve regurgitation is trivial. No evidence of pulmonic stenosis. Aorta:  The aortic root is normal in size and structure. Venous: The inferior vena cava is dilated in  size with less than 50% respiratory variability, suggesting right atrial pressure of 15 mmHg. IAS/Shunts: No atrial level shunt detected by color flow Doppler.  LEFT VENTRICLE PLAX 2D LVIDd:         3.30 cm   Diastology LVIDs:         2.10 cm   LV e' medial:    5.22 cm/s LV PW:         1.10 cm   LV E/e' medial:  10.0 LV IVS:        1.10 cm   LV e' lateral:   6.96 cm/s LVOT diam:     1.90 cm   LV E/e' lateral: 7.5 LV SV:         36 LV SV Index:   23 LVOT Area:     2.84 cm  RIGHT VENTRICLE RV Basal diam:  5.90 cm RV Mid diam:    4.70 cm RV S prime:     11.30 cm/s TAPSE (M-mode): 0.9 cm LEFT ATRIUM             Index        RIGHT ATRIUM           Index LA Vol (A2C):   17.3 ml 10.91 ml/m  RA Area:     26.50 cm LA Vol (A4C):   19.8 ml 12.49 ml/m  RA Volume:   103.00 ml 64.96 ml/m LA Biplane Vol: 18.9 ml 11.92 ml/m  AORTIC VALVE AV Area (Vmax):    1.89 cm AV Area (Vmean):   1.78 cm AV Area (VTI):     1.91 cm AV Vmax:           131.00 cm/s AV Vmean:          84.500 cm/s AV VTI:            0.187 m AV Peak Grad:      6.9 mmHg AV Mean Grad:      4.0 mmHg LVOT Vmax:         87.40 cm/s LVOT Vmean:        53.100 cm/s LVOT VTI:          0.126 m LVOT/AV VTI ratio: 0.67  AORTA Ao Root diam: 2.40 cm Ao Asc diam:  3.00 cm MITRAL VALVE               TRICUSPID VALVE MV Area (PHT): 5.02 cm    TR Peak grad:   86.9 mmHg MV Decel Time: 151 msec    TR Vmax:        466.00 cm/s MV E velocity: 52.30 cm/s MV A velocity: 76.70 cm/s  SHUNTS MV E/A ratio:  0.68        Systemic VTI:  0.13 m                            Systemic Diam: 1.90 cm Toribio Fuel MD Electronically signed by Toribio Fuel MD Signature Date/Time: 12/04/2023/4:36:20 PM    Final    CT Head Wo Contrast Result Date: 12/04/2023 EXAM: CT HEAD WITHOUT CONTRAST 12/04/2023 02:58:19 AM TECHNIQUE: CT of the head was performed without the administration of intravenous contrast. Automated exposure control, iterative reconstruction, and/or weight based adjustment of the  mA/kV was utilized to reduce the radiation dose to as low as reasonably achievable. COMPARISON: 01/19/2023 CLINICAL HISTORY:  Mental status change, unknown cause. Chief complaints; Shortness of Breath; Altered Mental Status. FINDINGS: BRAIN AND VENTRICLES: There is no acute intracranial hemorrhage, mass effect or midline shift. No abnormal extra-axial fluid collection. The gray-white differentiation is maintained without an acute infarct. There is no hydrocephalus. Mild subcortical and periventricular small vessel ischemic changes. ORBITS: Right phthisis bulbi. Layering hemorrhage in the left globe (image 28), unchanged. SINUSES: The visualized paranasal sinuses and mastoid air cells demonstrate no acute abnormality. SOFT TISSUES AND SKULL: No acute abnormality of the visualized skull or soft tissues. IMPRESSION: 1. No acute intracranial abnormality. Mild small vessel ischemic changes. 2. Right phthisis bulbi. Layering hemorrhage in the left globe, unchanged. Electronically signed by: Pinkie Pebbles MD 12/04/2023 03:14 AM EDT RP Workstation: [id number]   DG Chest Portable 1 View Result Date: 12/03/2023 CLINICAL DATA:  sob EXAM: PORTABLE CHEST - 1 VIEW COMPARISON:  January 22, 2023 FINDINGS: Diffuse interstitial opacities throughout both lungs. Hazy perihilar airspace opacities within both mid and lower lung zones. Small to moderate layering right pleural effusion again noted. Trace left pleural effusion. No pneumothorax. Moderate cardiomegaly. No acute fracture or destructive lesion. IMPRESSION: Moderate cardiomegaly with findings of pulmonary edema. Small to moderate layering right and trace left pleural effusions again noted. Electronically Signed   By: Rogelia Myers M.D.   On: 12/03/2023 18:03    No new imaging reviewed  Previous records (including but not limited to H&P, progress notes, nursing notes, TOC management) were reviewed in assessment of this patient.  Labs: CBC: Recent Labs  Lab  12/22/23 2120 12/23/23 0101 12/23/23 0423 12/23/23 0445 12/24/23 0841 12/25/23 0831 12/26/23 0829  WBC 13.5*  --   --  11.9* 13.1* 10.6* 10.4  NEUTROABS 11.8*  --   --   --   --   --   --   HGB 12.3*   < > 13.9 12.4* 11.7* 11.9* 13.4  HCT 39.0   < > 41.0 39.7 35.0* 36.5* 42.5  MCV 103.2*  --   --  104.2* 100.3* 100.8* 103.9*  PLT 100*  --   --  122* 110* 110* 92*   < > = values in this interval not displayed.   Basic Metabolic Panel: Recent Labs  Lab 12/22/23 2120 12/22/23 2241 12/23/23 0101 12/23/23 0423 12/23/23 0445 12/23/23 1048 12/23/23 2217 12/24/23 0210 12/24/23 0841 12/24/23 0842 12/25/23 0704  NA 133*  --  135 135 136  --   --   --  135  --  133*  K 6.8*  --  4.7 5.4* 5.3*   < > 4.5 4.8 3.6 3.7 5.0  CL 92*  --   --   --  97*  --   --   --  94*  --  93*  CO2 23  --   --   --  19*  --   --   --  23  --  20*  GLUCOSE 76  --   --   --  185*  --   --   --  75  --  117*  BUN 23*  --   --   --  29*  --   --   --  20  --  33*  CREATININE 4.47*  --   --   --  4.70*  --   --   --  3.94*  --  5.38*  CALCIUM  9.0  --   --   --  9.8  --   --   --  9.4  --  9.6  MG  --  2.2  --   --  2.2  --   --   --   --   --   --   PHOS  --   --   --   --  5.0*  --   --   --   --   --  5.9*   < > = values in this interval not displayed.   Liver Function Tests: Recent Labs  Lab 12/22/23 2120 12/23/23 0445 12/24/23 0841 12/25/23 0704  AST 234* 160* 312*  --   ALT 181* 149* 310*  --   ALKPHOS 95 78 81  --   BILITOT 2.4* 1.7* 2.0*  --   PROT 7.6 6.6 7.3  --   ALBUMIN  3.5 3.0* 3.4* 3.0*   CBG: Recent Labs  Lab 12/25/23 1938 12/25/23 2322 12/26/23 0317 12/26/23 0721 12/26/23 1135  GLUCAP 251* 107* 79 87 110*    Scheduled Meds:  Chlorhexidine  Gluconate Cloth  6 each Topical Q0600   [START ON 12/27/2023] Chlorhexidine  Gluconate Cloth  6 each Topical Q0600   diclofenac  Sodium  2 g Topical QID   feeding supplement (NEPRO CARB STEADY)  237 mL Oral BID BM   gabapentin   300 mg  Oral QHS   heparin  injection (subcutaneous)  5,000 Units Subcutaneous Q12H   insulin  aspart  0-5 Units Subcutaneous QHS   lidocaine   1 patch Transdermal Q24H   midodrine   5 mg Oral TID WC   mupirocin  ointment  1 Application Nasal BID   vancomycin  variable dose per unstable renal function (pharmacist dosing)   Does not apply See admin instructions   Continuous Infusions:  albumin  human     ceFEPime  (MAXIPIME ) IV Stopped (12/25/23 2228)   PRN Meds:.acetaminophen , albumin  human, albuterol , oxyCODONE -acetaminophen   Family Communication: None at bedside  Disposition: Status is: Inpatient Remains inpatient appropriate because: Acute hypoxic respiratory failure, pneumonia     Time spent: 50 minutes  Length of inpatient stay: 3 days  Author: Carliss LELON Canales, DO 12/26/2023 12:09 PM  For on call review [email or web address].

## 2023-12-26 NOTE — Plan of Care (Signed)
  Problem: Clinical Measurements: Goal: Ability to maintain clinical measurements within normal limits will improve Outcome: Progressing Goal: Respiratory complications will improve Outcome: Progressing   Problem: Nutrition: Goal: Adequate nutrition will be maintained Outcome: Progressing

## 2023-12-26 NOTE — Progress Notes (Signed)
 [facility] Progress Note  Subjective:  Seen in HD unit, no c/o's On 6 Lpm [place] O2   Vitals:   12/26/23 1005 12/26/23 1105 12/26/23 1112 12/26/23 1136  BP:  111/78    Pulse: 80 92 86   Resp: 12 16 15    Temp:    97.6 F (36.4 C)  TempSrc:    Oral  SpO2: 93% 90% 94%   Weight:      Height:        Exam: General: Sitting up in bed, on nasal oxygen   Lungs: Clear bilaterally. Normal WOB  Heart: RRR, no murmur, rub, or gallop  Abdomen: soft non-tender  Lower extremities: no pitting edema Neuro: A & O X 3. Moves all extremities spontaneously. Psych:  Responds to questions appropriately with a normal affect. Dialysis Access: LUE AVF +bruit     OP HD: [facility] MWF 4h  62kg  B400  2K  AVF  Heparin  3000 Hectorol  6 micrograms qhd   Assessment/ Plan: Sepsis: Sp IVF bolus. Probable PNA. BCx's negative, low grade fevers stable and wbc down. On IV abx. Per pmd Acute/chronic hypoxemic respiratory failure: pt on 2L [place] at home / SNF. Per CT +ground glass changes c/w edema. Had HD here Tuesday and today, 2.1 L off w/ each session. 1kg over dry wt now. No sig vol excess on exam.   Acute encephalopathy: Related to above, improved.  ESRD: HD MWF. HD Next HD Sat then resume MWF.  Access: concern for recirculation of AVF at OP unit. VVS saw and will plan for fistulogram as outpatient. Appreciate assistance.  Hypotension: taking 2-3 bp lowering meds at home. On hold for now.  Anemia: Hgb >12. Not on  ESA  Dispo: came from [facility] MD  [facility] 12/26/2023, 11:40 AM  Recent Labs  Lab 12/23/23 0445 12/23/23 1048 12/24/23 0841 12/24/23 0842 12/25/23 0704 12/25/23 0831 12/26/23 0829  HGB 12.4*  --  11.7*  --   --  11.9* 13.4  ALBUMIN  3.0*  --  3.4*  --  3.0*  --   --   CALCIUM  9.8  --  9.4  --  9.6  --   --   PHOS 5.0*  --   --   --  5.9*  --   --   CREATININE 4.70*  --  3.94*  --  5.38*  --   --   K 5.3*   < > 3.6 3.7 5.0  --   --    < > = values in this interval not  displayed.   No results for input(s): IRON, TIBC, FERRITIN in the last 168 hours. Inpatient medications:  Chlorhexidine  Gluconate Cloth  6 each Topical Q0600   diclofenac  Sodium  2 g Topical QID   feeding supplement (NEPRO CARB STEADY)  237 mL Oral BID BM   gabapentin   300 mg Oral QHS   heparin  injection (subcutaneous)  5,000 Units Subcutaneous Q12H   insulin  aspart  0-5 Units Subcutaneous QHS   lidocaine   1 patch Transdermal Q24H   midodrine   5 mg Oral TID WC   mupirocin  ointment  1 Application Nasal BID   vancomycin  variable dose per unstable renal function (pharmacist dosing)   Does not apply See admin instructions    albumin  human     ceFEPime  (MAXIPIME ) IV Stopped (12/25/23 2228)   acetaminophen , albumin  human, albuterol , oxyCODONE -acetaminophen 

## 2023-12-26 NOTE — Plan of Care (Signed)
  Problem: Education: Goal: Knowledge of General Education information will improve Description: Including pain rating scale, medication(s)/side effects and non-pharmacologic comfort measures Outcome: Progressing   Problem: Health Behavior/Discharge Planning: Goal: Ability to manage health-related needs will improve Outcome: Progressing   Problem: Clinical Measurements: Goal: Ability to maintain clinical measurements within normal limits will improve Outcome: Progressing   Problem: Activity: Goal: Risk for activity intolerance will decrease Outcome: Progressing   Problem: Pain Managment: Goal: General experience of comfort will improve and/or be controlled Outcome: Progressing   Problem: Safety: Goal: Ability to remain free from injury will improve Outcome: Progressing

## 2023-12-26 NOTE — Evaluation (Signed)
 Occupational Therapy Evaluation Patient Details Name: Christopher Davila MRN: [id number] DOB: Jul 16, 1970 Today's Date: 12/26/2023   History of Present Illness   Patient is a 53 y/o male admitted 12/22/23 with AMS and hypotension post dialysis.  Found to have PNA and pulmonary edema with increased O2 requirement.  PMH includes CHF, ESRD on HD, blindness, HTN, gastroparesis, DM, HTN.     Clinical Impressions PTA, pt is a resident of [facility], typically ambulatory with RW and reports Setup assist for ADLs. Pt with baseline visual impairments. Pt presents now with deficits in strength, dynamic standing balance and cardiopulmonary endurance. With encouragement, pt able to demonstrate transfers using RW with CGA and requires no more than Min A for ADL completion. Will follow acutely at a distance to ensure continued ADL/mobility progress back to baseline. Anticipate no OT needs upon DC. Recommend return to SNF when medically stable.  BP sitting: 120/84 BP standing: 111/78 SpO2 89-91% on 6 L O2 w/ light activity     If plan is discharge home, recommend the following:   A little help with bathing/dressing/bathroom;A little help with walking and/or transfers     Functional Status Assessment   Patient has had a recent decline in their functional status and demonstrates the ability to make significant improvements in function in a reasonable and predictable amount of time.     Equipment Recommendations   None recommended by OT     Recommendations for Other Services         Precautions/Restrictions   Precautions Precautions: Fall Precaution/Restrictions Comments: monitor O2, visually impaired Restrictions Weight Bearing Restrictions Per Provider Order: No     Mobility Bed Mobility               General bed mobility comments: in recliner on entry    Transfers Overall transfer level: Needs assistance Equipment used: Rolling walker (2 wheels) Transfers: Sit  to/from Stand Sit to Stand: Contact guard assist                  Balance Overall balance assessment: Needs assistance Sitting-balance support: No upper extremity supported, Feet supported Sitting balance-Leahy Scale: Good     Standing balance support: Bilateral upper extremity supported, During functional activity Standing balance-Leahy Scale: Fair                             ADL either performed or assessed with clinical judgement   ADL Overall ADL's : Needs assistance/impaired Eating/Feeding: Set up   Grooming: Set up;Sitting   Upper Body Bathing: Set up;Sitting   Lower Body Bathing: Minimal assistance;Sitting/lateral leans;Sit to/from stand   Upper Body Dressing : Set up;Sitting   Lower Body Dressing: Minimal assistance;Sit to/from stand;Sitting/lateral leans   Toilet Transfer: Contact guard assist;Stand-pivot;Rolling walker (2 wheels)   Toileting- Clothing Manipulation and Hygiene: Minimal assistance;Sit to/from stand;Sitting/lateral lean               Vision Baseline Vision/History: 2 Legally blind Ability to See in Adequate Light: 4 Severely impaired Patient Visual Report: No change from baseline Vision Assessment?: Vision impaired- to be further tested in functional context Additional Comments: blindness at baseline     Perception         Praxis         Pertinent Vitals/Pain Pain Assessment Pain Assessment: Faces Faces Pain Scale: Hurts a little bit Pain Location: L knee (chronic) Pain Descriptors / Indicators: Sore Pain Intervention(s): Monitored during session, Heat applied  Extremity/Trunk Assessment Upper Extremity Assessment Upper Extremity Assessment: Generalized weakness;Right hand dominant   Lower Extremity Assessment Lower Extremity Assessment: Defer to PT evaluation   Cervical / Trunk Assessment Cervical / Trunk Assessment: Normal   Communication Communication Communication: No apparent difficulties    Cognition Arousal: Alert Behavior During Therapy: WFL for tasks assessed/performed Cognition: No apparent impairments             OT - Cognition Comments: likely at baseline, self limiting                 Following commands: Intact       Cueing  General Comments   Cueing Techniques: Verbal cues      Exercises     Shoulder Instructions      Home Living Family/patient expects to be discharged to:: Skilled nursing facility                                 Additional Comments: from [facility] there under LTC status      Prior Functioning/Environment Prior Level of Function : Needs assist             Mobility Comments: RW for mobility short distances ADLs Comments: Reports toileting self, setup for bathing and dressing tasks.    OT Problem List: Decreased strength;Decreased activity tolerance;Impaired balance (sitting and/or standing);Cardiopulmonary status limiting activity   OT Treatment/Interventions: Self-care/ADL training;Therapeutic exercise;Energy conservation;DME and/or AE instruction;Therapeutic activities;Patient/family education;Balance training      OT Goals(Current goals can be found in the care plan section)   Acute Rehab OT Goals Patient Stated Goal: get some rest OT Goal Formulation: With patient Time For Goal Achievement: 01/09/24 Potential to Achieve Goals: Good   OT Frequency:  Min 1X/week    Co-evaluation              AM-PAC OT 6 Clicks Daily Activity     Outcome Measure Help from another person eating meals?: A Little Help from another person taking care of personal grooming?: A Little Help from another person toileting, which includes using toliet, bedpan, or urinal?: A Little Help from another person bathing (including washing, rinsing, drying)?: A Little Help from another person to put on and taking off regular upper body clothing?: A Little Help from another person to put on and taking off  regular lower body clothing?: A Little 6 Click Score: 18   End of Session Equipment Utilized During Treatment: Rolling walker (2 wheels);Oxygen  Nurse Communication: Mobility status;Other (comment) (pt reported itching all over)  Activity Tolerance: Patient limited by fatigue Patient left: in chair;with call bell/phone within reach;with chair alarm set  OT Visit Diagnosis: Muscle weakness (generalized) (M62.81)                Time: 8954-8887 OT Time Calculation (min): 27 min Charges:  OT General Charges $OT Visit: 1 Visit OT Evaluation $OT Eval Low Complexity: 1 Low OT Treatments $Therapeutic Activity: 8-22 mins  Mliss NOVAK, OTR/L Acute Rehab Services Office: [phone]   Mliss Fish 12/26/2023, 11:24 AM

## 2023-12-26 NOTE — Progress Notes (Signed)
 Physical Therapy Treatment Patient Details Name: Kyzer Blowe MRN: [id number] DOB: [birth year] Today's Date: 12/26/2023   History of Present Illness Patient is a 53 y/o male admitted 12/22/23 with AMS and hypotension post dialysis.  Found to have PNA and pulmonary edema with increased O2 requirement.  PMH includes CHF, ESRD on HD, blindness, HTN, gastroparesis, DM, HTN.    PT Comments  Patient declined mobility despite encouragement endorsing fatigue/SOB/already walked today.  Patient educated on use of incentive spirometer and performed x 10 up to 750 with mod cues.  Encouraged to continue to improve respiratory status.  PT will follow up.     If plan is discharge home, recommend the following: A little help with walking and/or transfers;A lot of help with bathing/dressing/bathroom;Direct supervision/assist for medications management;Help with stairs or ramp for entrance;Assist for transportation   Can travel by private vehicle        Equipment Recommendations  None recommended by PT    Recommendations for Other Services       Precautions / Restrictions Precautions Precautions: Fall Precaution/Restrictions Comments: monitor O2, visually impaired     Mobility  Bed Mobility               General bed mobility comments: declined mobility endorsing fatigue, SOB, already walked around the bed today    Transfers                        Ambulation/Gait                   Stairs             Wheelchair Mobility     Tilt Bed    Modified Rankin (Stroke Patients Only)       Balance                                            Communication Communication Communication: No apparent difficulties  Cognition Arousal: Alert Behavior During Therapy: Flat affect                             Following commands: Intact      Cueing Cueing Techniques: Verbal cues  Exercises Other Exercises Other Exercises:  incentive spirometer issued and educated in use, performed x 10 u to 750 ml; encouraged continued use, 5x/hour    General Comments        Pertinent Vitals/Pain Pain Assessment Pain Assessment: No/denies pain    Home Living                          Prior Function            PT Goals (current goals can now be found in the care plan section) Progress towards PT goals: Not progressing toward goals - comment    Frequency    Min 1X/week      PT Plan      Co-evaluation              AM-PAC PT 6 Clicks Mobility   Outcome Measure  Help needed turning from your back to your side while in a flat bed without using bedrails?: A Lot Help needed moving from lying on your back to sitting on the side of a flat bed without using  bedrails?: A Lot Help needed moving to and from a bed to a chair (including a wheelchair)?: A Lot Help needed standing up from a chair using your arms (e.g., wheelchair or bedside chair)?: A Lot Help needed to walk in hospital room?: Total Help needed climbing 3-5 steps with a railing? : Total 6 Click Score: 10    End of Session Equipment Utilized During Treatment: Oxygen  Activity Tolerance: Other (comment) (self limited) Patient left: in chair;with call bell/phone within reach   PT Visit Diagnosis: Muscle weakness (generalized) (M62.81)     Time: 8294-8285 PT Time Calculation (min) (ACUTE ONLY): 9 min  Charges:    $Self Care/Home Management: 8-22 PT General Charges $$ ACUTE PT VISIT: 1 Visit                     Micheline Portal, PT Acute Rehabilitation Services Office:[phone] 12/26/2023    Montie Portal 12/26/2023, 5:58 PM

## 2023-12-27 ENCOUNTER — Inpatient Hospital Stay (HOSPITAL_COMMUNITY)

## 2023-12-27 DIAGNOSIS — R6521 Severe sepsis with septic shock: Secondary | ICD-10-CM | POA: Diagnosis not present

## 2023-12-27 DIAGNOSIS — A419 Sepsis, unspecified organism: Secondary | ICD-10-CM | POA: Diagnosis not present

## 2023-12-27 LAB — COMPREHENSIVE METABOLIC PANEL WITH GFR
ALT: 374 U/L — ABNORMAL HIGH (ref 0–44)
AST: 228 U/L — ABNORMAL HIGH (ref 15–41)
Albumin: 3.2 g/dL — ABNORMAL LOW (ref 3.5–5.0)
Alkaline Phosphatase: 104 U/L (ref 38–126)
Anion gap: 19 — ABNORMAL HIGH (ref 5–15)
BUN: 38 mg/dL — ABNORMAL HIGH (ref 6–20)
CO2: 21 mmol/L — ABNORMAL LOW (ref 22–32)
Calcium: 10.3 mg/dL (ref 8.9–10.3)
Chloride: 92 mmol/L — ABNORMAL LOW (ref 98–111)
Creatinine, Ser: 5.86 mg/dL — ABNORMAL HIGH (ref 0.61–1.24)
GFR, Estimated: 11 mL/min — ABNORMAL LOW (ref >60)
Glucose, Bld: 112 mg/dL — ABNORMAL HIGH (ref 70–99)
Potassium: 4.6 mmol/L (ref 3.5–5.1)
Sodium: 132 mmol/L — ABNORMAL LOW (ref 135–145)
Total Bilirubin: 2.1 mg/dL — ABNORMAL HIGH (ref 0.0–1.2)
Total Protein: 7.6 g/dL (ref 6.5–8.1)

## 2023-12-27 LAB — PHOSPHORUS: Phosphorus: 5.1 mg/dL — ABNORMAL HIGH (ref 2.5–4.6)

## 2023-12-27 LAB — CBC
HCT: 38.3 % — ABNORMAL LOW (ref 39.0–52.0)
Hemoglobin: 12.4 g/dL — ABNORMAL LOW (ref 13.0–17.0)
MCH: 32.9 pg (ref 26.0–34.0)
MCHC: 32.4 g/dL (ref 30.0–36.0)
MCV: 101.6 fL — ABNORMAL HIGH (ref 80.0–100.0)
Platelets: 82 K/uL — ABNORMAL LOW (ref 150–400)
RBC: 3.77 MIL/uL — ABNORMAL LOW (ref 4.22–5.81)
RDW: 16.9 % — ABNORMAL HIGH (ref 11.5–15.5)
WBC: 11.3 K/uL — ABNORMAL HIGH (ref 4.0–10.5)
nRBC: 1.7 % — ABNORMAL HIGH (ref 0.0–0.2)

## 2023-12-27 LAB — GLUCOSE, CAPILLARY
Glucose-Capillary: 101 mg/dL — ABNORMAL HIGH (ref 70–99)
Glucose-Capillary: 118 mg/dL — ABNORMAL HIGH (ref 70–99)
Glucose-Capillary: 122 mg/dL — ABNORMAL HIGH (ref 70–99)
Glucose-Capillary: 139 mg/dL — ABNORMAL HIGH (ref 70–99)

## 2023-12-27 LAB — MAGNESIUM: Magnesium: 2.5 mg/dL — ABNORMAL HIGH (ref 1.7–2.4)

## 2023-12-27 LAB — CULTURE, BLOOD (ROUTINE X 2)
Culture: NO GROWTH
Special Requests: ADEQUATE

## 2023-12-27 MED ORDER — IOHEXOL 350 MG/ML SOLN
75.0000 mL | Freq: Once | INTRAVENOUS | Status: AC | PRN
  Administered 2023-12-27: 75 mL via INTRAVENOUS

## 2023-12-27 MED ORDER — HEPARIN SODIUM (PORCINE) 1000 UNIT/ML DIALYSIS: 3000.0000 [IU] | Freq: Once | INTRAMUSCULAR | Status: DC

## 2023-12-27 MED ORDER — DIPHENHYDRAMINE HCL 50 MG/ML IJ SOLN
25.0000 mg | Freq: Three times a day (TID) | INTRAMUSCULAR | Status: DC | PRN
  Administered 2023-12-27 – 2023-12-28 (×2): 25 mg via INTRAVENOUS
  Filled 2023-12-27 (×2): qty 1

## 2023-12-27 MED ORDER — ANTICOAGULANT SODIUM CITRATE 4% (200MG/5ML) IV SOLN: 5.0000 mL | Status: DC | PRN

## 2023-12-27 MED ORDER — LIDOCAINE-PRILOCAINE 2.5-2.5 % EX CREA: 1.0000 | TOPICAL_CREAM | CUTANEOUS | Status: DC | PRN

## 2023-12-27 MED ORDER — SODIUM CHLORIDE 0.9 % IV SOLN: 25.0000 mg | Freq: Four times a day (QID) | INTRAVENOUS | Status: DC | PRN

## 2023-12-27 MED ORDER — HEPARIN SODIUM (PORCINE) 1000 UNIT/ML DIALYSIS
1000.0000 [IU] | INTRAMUSCULAR | Status: DC | PRN
Start: 2023-12-27 — End: 2023-12-27

## 2023-12-27 MED ORDER — HEPARIN SODIUM (PORCINE) 1000 UNIT/ML IJ SOLN
INTRAMUSCULAR | Status: AC
Start: 2023-12-27 — End: 2023-12-27
  Filled 2023-12-27: qty 5

## 2023-12-27 MED ORDER — HEPARIN SODIUM (PORCINE) 1000 UNIT/ML DIALYSIS
1500.0000 [IU] | INTRAMUSCULAR | Status: DC | PRN
Start: 2023-12-28 — End: 2023-12-27

## 2023-12-27 MED ORDER — ALTEPLASE 2 MG IJ SOLR: 2.0000 mg | Freq: Once | INTRAMUSCULAR | Status: DC | PRN

## 2023-12-27 MED ORDER — NEPRO/CARBSTEADY PO LIQD: 237.0000 mL | ORAL | Status: DC | PRN

## 2023-12-27 MED ORDER — PENTAFLUOROPROP-TETRAFLUOROETH EX AERO: 1.0000 | INHALATION_SPRAY | CUTANEOUS | Status: DC | PRN

## 2023-12-27 MED ORDER — ALBUMIN HUMAN 25 % IV SOLN: 25.0000 g | INTRAVENOUS | Status: DC | PRN

## 2023-12-27 MED ORDER — LIDOCAINE HCL (PF) 1 % IJ SOLN: 5.0000 mL | INTRAMUSCULAR | Status: DC | PRN

## 2023-12-27 NOTE — Progress Notes (Signed)
 Progress Note   [name] FMW:[id number] DOB: 23-Nov-1970 DOA: 12/22/2023  DOS: the patient was seen and examined on 12/27/2023   Brief hospital course:  53 y.o. male with hx of ESRD on MWF HD, RV failure with severe pulm hypertension by echo, LV diastolic failure, diabetes type 2, reactive airway disease, ? Neuromuscular disorder with chronic fasciculations, who was brought in from SNF due to altered mental status and hypotension.    Assessment and Plan:   Acute on chronic hypoxic respiratory failure - Likely from PNA with contributions of pulmonary edema/volume overload.  On antibiotics, clinically improving, HD being done for fluid removal, nephrology on board, still has some fluid overload, defer HD treatments to nephrology, of note patient uses 2 to 4 L nasal cannula oxygen  at home, continue supplemental oxygen , advance activity and titrate down oxygen  as much as possible, I-S and flutter valve for pulmonary toiletry  Mixed septic and cardiogenic shock - Hypotensive, encephalopathic, with possible pneumonia on presentation.  Initially requiring vasopressor medications and CRRT.  Subsequently shock appears resolved at this time.  Transitioned out of ICU 7/9.  Possible community-acquired pneumonia - Likely contributing to patient's hypoxia.  Received 5 days of IV vancomycin  and cefepime .  Monitor respiratory function.   Acute metabolic encephalopathy/hepatic encephalopathy - Appears to be resolved at this time.   Acute liver injury - Hepatocellular etiology initially likely from shock liver due to sepsis, monitor trend, check INR intermittently   Acute HFpEF EF 65% - Secondary to volume overload.  HD/UF per nephrology.  Showing improvement.   ESRD - Normal regiment MWF.  Volume overloaded on presentation.  Nephrology consulted and following closely.   Severe pulmonary hypertension with right heart failure - Appears to be showing improvement with UF/fluid removal.   Seen by pulmonary this admission, follow-up with pulmonary outpatient postdischarge.  Will check CTA to rule out any acute or chronic PE   Microcytic microcytic anemia and thrombocytopenia - Monitoring.  Recheck CBC in AM.   Chronic neuromuscular disease with chronic fasciculations - Supportive care care with outpatient neurology and PCP follow-up   Diabetes mellitus 2 - Insulin  sliding scale on board.  Lab Results  Component Value Date   HGBA1C 6.5 (H) 12/04/2023   CBG (last 3)  Recent Labs    12/26/23 1632 12/26/23 2109 12/27/23 0739  GLUCAP 158* 169* 139*      Subjective: Patient in bed, appears comfortable, denies any headache, no fever, no chest pain or pressure, no shortness of breath , no abdominal pain. No focal weakness.  Complains of some itching off-and-on  Physical Exam:  Vitals:   12/26/23 2000 12/26/23 2248 12/27/23 0346 12/27/23 0740  BP: 120/86 (!) 155/111 104/80 117/85  Pulse: 84 79 79 83  Resp: 17 16 19 15   Temp: 97.8 F (36.6 C) 97.6 F (36.4 C) 97.9 F (36.6 C) 97.9 F (36.6 C)  TempSrc: Oral Oral Oral Oral  SpO2:  92% 92% (!) 89%  Weight:      Height:        Awake Alert, No new F.N deficits, Normal affect [place].AT, legally blind Supple Neck, No JVD,   Symmetrical Chest wall movement, Good air movement bilaterally, CTAB RRR,No Gallops, Rubs or new Murmurs,  +ve B.Sounds, Abd Soft, No tenderness,   1+ leg edema    Data Review:   Inpatient Medications  Scheduled Meds:  Chlorhexidine  Gluconate Cloth  6 each Topical Q0600   diclofenac  Sodium  2 g Topical QID   feeding  supplement (NEPRO CARB STEADY)  237 mL Oral BID BM   gabapentin   300 mg Oral QHS   heparin  injection (subcutaneous)  5,000 Units Subcutaneous Q12H   insulin  aspart  0-5 Units Subcutaneous QHS   insulin  aspart  0-6 Units Subcutaneous TID WC   lidocaine   1 patch Transdermal Q24H   midodrine   5 mg Oral TID WC   mupirocin  ointment  1 Application Nasal BID   Continuous  Infusions:  albumin  human     ceFEPime  (MAXIPIME ) IV 1 g (12/26/23 2215)   diphenhydrAMINE      PRN Meds:.acetaminophen , albumin  human, albuterol , diphenhydrAMINE , oxyCODONE -acetaminophen   DVT Prophylaxis  heparin  injection 5,000 Units Start: 12/23/23 1345 SCDs Start: 12/23/23 0431   Recent Labs  Lab 12/22/23 2120 12/23/23 0101 12/23/23 0445 12/24/23 0841 12/25/23 0831 12/26/23 0829 12/27/23 0617  WBC 13.5*  --  11.9* 13.1* 10.6* 10.4 11.3*  HGB 12.3*   < > 12.4* 11.7* 11.9* 13.4 12.4*  HCT 39.0   < > 39.7 35.0* 36.5* 42.5 38.3*  PLT 100*  --  122* 110* 110* 92* 82*  MCV 103.2*  --  104.2* 100.3* 100.8* 103.9* 101.6*  MCH 32.5  --  32.5 33.5 32.9 32.8 32.9  MCHC 31.5  --  31.2 33.4 32.6 31.5 32.4  RDW 15.6*  --  16.1* 15.9* 16.2* 17.3* 16.9*  LYMPHSABS 0.8  --   --   --   --   --   --   MONOABS 0.7  --   --   --   --   --   --   EOSABS 0.0  --   --   --   --   --   --   BASOSABS 0.0  --   --   --   --   --   --    < > = values in this interval not displayed.    Recent Labs  Lab 12/22/23 2120 12/22/23 2241 12/22/23 2251 12/23/23 0049 12/23/23 0101 12/23/23 0417 12/23/23 0423 12/23/23 0425 12/23/23 0445 12/23/23 1048 12/24/23 0841 12/24/23 0842 12/25/23 0704 12/26/23 1218 12/27/23 0617  NA 133*  --   --   --  135  --    < >  --  136  --  135  --  133* 132* 132*  K 6.8*  --   --   --  4.7  --    < >  --  5.3*   < > 3.6 3.7 5.0 4.6 4.6  CL 92*  --   --   --   --   --   --   --  97*  --  94*  --  93* 91* 92*  CO2 23  --   --   --   --   --   --   --  19*  --  23  --  20* 22 21*  ANIONGAP 18*  --   --   --   --   --   --   --  20*  --  18*  --  20* 19* 19*  GLUCOSE 76  --   --   --   --   --   --   --  185*  --  75  --  117* 109* 112*  BUN 23*  --   --   --   --   --   --   --  29*  --  20  --  33* 26* 38*  CREATININE 4.47*  --   --   --   --   --   --   --  4.70*  --  3.94*  --  5.38* 4.87* 5.86*  AST 234*  --   --   --   --   --   --   --  160*  --  312*  --    --  248* 228*  ALT 181*  --   --   --   --   --   --   --  149*  --  310*  --   --  408* 374*  ALKPHOS 95  --   --   --   --   --   --   --  78  --  81  --   --  103 104  BILITOT 2.4*  --   --   --   --   --   --   --  1.7*  --  2.0*  --   --  2.1* 2.1*  ALBUMIN  3.5  --   --   --   --   --   --   --  3.0*  --  3.4*  --  3.0* 3.5 3.2*  PROCALCITON  --   --   --   --   --   --   --   --   --   --  1.94  --   --   --   --   LATICACIDVEN  --   --  4.9*  --  3.6*  --   --  6.3* 7.1*  --   --   --   --   --   --   INR  --  1.7*  --   --   --   --   --   --   --   --   --   --   --   --   --   TSH  --   --   --   --   --  1.859  --   --   --   --   --   --   --   --   --   AMMONIA  --   --   --  63*  --   --   --   --   --   --   --   --   --   --   --   MG  --  2.2  --   --   --   --   --   --  2.2  --   --   --   --  2.4 2.5*  PHOS  --   --   --   --   --   --   --   --  5.0*  --   --   --  5.9* 4.3 5.1*  CALCIUM  9.0  --   --   --   --   --   --   --  9.8  --  9.4  --  9.6 10.2 10.3   < > = values in this interval not displayed.      Recent Labs  Lab 12/22/23 2241 12/22/23 2251 12/23/23 0049 12/23/23 0101 12/23/23 9582 12/23/23 0425 12/23/23 0445 12/24/23 9158 12/25/23 0704 12/26/23 1218 12/27/23 0617  PROCALCITON  --   --   --   --   --   --   --  1.94  --   --   --   LATICACIDVEN  --  4.9*  --  3.6*  --  6.3* 7.1*  --   --   --   --   INR 1.7*  --   --   --   --   --   --   --   --   --   --   TSH  --   --   --   --  1.859  --   --   --   --   --   --   AMMONIA  --   --  63*  --   --   --   --   --   --   --   --   MG 2.2  --   --   --   --   --  2.2  --   --  2.4 2.5*  CALCIUM   --   --   --   --   --   --  9.8 9.4 9.6 10.2 10.3    --------------------------------------------------------------------------------------------------------------- Lab Results  Component Value Date   CHOL 98 (L) 04/26/2021   HDL 56 04/26/2021   LDLCALC 29 04/26/2021   TRIG 55 04/26/2021   CHOLHDL  1.8 04/26/2021    Lab Results  Component Value Date   HGBA1C 6.5 (H) 12/04/2023    Micro Results Recent Results (from the past 240 hours)  Culture, blood (Routine x 2)     Status: None   Collection Time: 12/22/23  9:20 PM   Specimen: BLOOD  Result Value Ref Range Status   Specimen Description BLOOD SITE NOT SPECIFIED  Final   Special Requests   Final    BOTTLES DRAWN AEROBIC AND ANAEROBIC Blood Culture adequate volume   Culture   Final    NO GROWTH 5 DAYS Performed at [facility], [address], [place], [place] [address]    Report Status 12/27/2023 FINAL  Final  Culture, blood (Routine x 2)     Status: None (Preliminary result)   Collection Time: 12/23/23 12:49 AM   Specimen: BLOOD LEFT HAND  Result Value Ref Range Status   Specimen Description BLOOD LEFT HAND  Final   Special Requests   Final    BOTTLES DRAWN AEROBIC AND ANAEROBIC Blood Culture adequate volume   Culture   Final    NO GROWTH 4 DAYS Performed at [facility], [address], [place], [place] [address]    Report Status PENDING  Incomplete  MRSA Next Gen by PCR, Nasal     Status: Abnormal   Collection Time: 12/23/23  8:03 AM   Specimen: Nasal Mucosa; Nasal Swab  Result Value Ref Range Status   MRSA by PCR Next Gen DETECTED (A) NOT DETECTED Final    Comment: RESULT CALLED TO, READ BACK BY AND VERIFIED WITH: RN LOUIS GUERRIERO ON 12/23/23 @ 1245 BY DRT (NOTE) The GeneXpert MRSA Assay (FDA approved for NASAL specimens only), is one component of a comprehensive MRSA colonization surveillance program. It is not intended to diagnose MRSA infection nor to guide or monitor treatment for MRSA infections. Test performance is not FDA approved in patients less than 52 years old. Performed at [facility], [address], [place], [place] [address]     Radiology Reports  DG Chest [place] 1 View Result Date: 12/27/2023 CLINICAL DATA:  [id number] with shortness of breath and septic shock. EXAM:  PORTABLE CHEST 1 VIEW COMPARISON:  AP Lat chest 12/22/2023. FINDINGS: 6:56 a.m. Heart is enlarged. There is central vascular congestion and mild interstitial consolidation with a basal gradient consistent with edema with small right-greater-than-left pleural effusions. Patchy haziness in the lower lung fields is similar to the previous exam and may suggest ground-glass edema or pneumonitis. The upper lung fields show no focal opacity. Overall aeration seems unchanged. The mediastinum is stable. No new osseous findings. Right axillary surgical clips. IMPRESSION: 1. Cardiomegaly with central vascular congestion and mild interstitial consolidation with a basal gradient consistent with edema with small right-greater-than-left pleural effusions. 2. Patchy haziness in the lower lung fields is similar to the previous exam and may suggest ground-glass edema or pneumonitis. Electronically Signed   By: Francis Quam M.D.   On: 12/27/2023 07:14      Signature  -   Lavada Stank M.D on 12/27/2023 at 9:54 AM   -  To page go to [email or web address]

## 2023-12-27 NOTE — Progress Notes (Signed)
 [facility] Progress Note  Subjective:  Seen in pt room Remains on 5 Lpm [place] O2   Vitals:   12/26/23 2000 12/26/23 2248 12/27/23 0346 12/27/23 0740  BP: 120/86 (!) 155/111 104/80 117/85  Pulse: 84 79 79 83  Resp: 17 16 19 15   Temp: 97.8 F (36.6 C) 97.6 F (36.4 C) 97.9 F (36.6 C) 97.9 F (36.6 C)  TempSrc: Oral Oral Oral Oral  SpO2:  92% 92% (!) 89%  Weight:      Height:        Exam: General: lying down, [place] 5 lpm O2 Lungs: Clear bilaterally. Normal WOB  Heart: RRR, no murmur, rub, or gallop  Abdomen: soft non-tender  Lower extremities: trace-1+ LE edema Neuro: A & O X 3. Moves all extremities spontaneously. Psych:  Responds to questions appropriately with a normal affect. Dialysis Access: LUE AVF +bruit     OP HD: [facility] MWF 4h  62kg  B400  2K  AVF  Heparin  3000 Hectorol  6 micrograms qhd   Assessment/ Plan: Sepsis: Sp IVF bolus. Probable PNA. BCx's negative, low grade fevers stable and wbc down. On IV abx.  Acute/chronic hypoxemic respiratory failure: pt on 2L [place] at home / SNF. Per CT +ground glass changes c/w early edema. Had HD here Tuesday and today, 2.1 L off w/ each session. 1.5kg over dry wt now. No sig vol excess on exam.   Acute encephalopathy: better  ESRD: HD MWF. HD Next HD Sat then resume MWF.  Access: concern for recirculation of AVF at OP unit. VVS saw and will plan for fistulogram as outpatient. Appreciate assistance.  Hypotension: taking 2-3 bp lowering meds at home. On hold for now.  Anemia: Hgb >12. Not on  ESA  Dispo: came from SNF   Myer Fret MD  [facility] 12/27/2023, 12:47 PM  Recent Labs  Lab 12/26/23 0829 12/26/23 1218 12/27/23 0617  HGB 13.4  --  12.4*  ALBUMIN   --  3.5 3.2*  CALCIUM   --  10.2 10.3  PHOS  --  4.3 5.1*  CREATININE  --  4.87* 5.86*  K  --  4.6 4.6   No results for input(s): IRON, TIBC, FERRITIN in the last 168 hours. Inpatient medications:  Chlorhexidine  Gluconate Cloth  6 each Topical Q0600    diclofenac  Sodium  2 g Topical QID   feeding supplement (NEPRO CARB STEADY)  237 mL Oral BID BM   gabapentin   300 mg Oral QHS   [START ON 01/24/24] heparin   3,000 Units Dialysis Once in dialysis   heparin  injection (subcutaneous)  5,000 Units Subcutaneous Q12H   insulin  aspart  0-5 Units Subcutaneous QHS   insulin  aspart  0-6 Units Subcutaneous TID WC   lidocaine   1 patch Transdermal Q24H   midodrine   5 mg Oral TID WC   mupirocin  ointment  1 Application Nasal BID    albumin  human     anticoagulant sodium citrate      ceFEPime  (MAXIPIME ) IV 1 g (12/26/23 2215)   acetaminophen , albumin  human, albuterol , alteplase , anticoagulant sodium citrate , diphenhydrAMINE , feeding supplement (NEPRO CARB STEADY), [START ON 2024/01/24] heparin , lidocaine  (PF), lidocaine -prilocaine , oxyCODONE -acetaminophen , pentafluoroprop-tetrafluoroeth

## 2023-12-27 NOTE — TOC Progression Note (Signed)
 Transition of Care [facility]) - Progression Note    Patient Details  Name: Christopher Davila MRN: [id number] Date of Birth: [birth year]  Transition of Care [facility]) CM/SW Contact  Isaiah Public, LCSWA Phone Number: 12/27/2023, 3:53 PM  Clinical Narrative:     Plan for patient to return to [facility] when medically ready for dc. CSW will continue to follow.  Expected Discharge Plan: Long Term Nursing Home Barriers to Discharge: Continued Medical Work up  Expected Discharge Plan and Services In-house Referral: Clinical Social Work   Post Acute Care Choice: Skilled Nursing Facility, Nursing Home Living arrangements for the past 2 months: Skilled Nursing Facility                                       Social Determinants of Health (SDOH) Interventions SDOH Screenings   Food Insecurity: No Food Insecurity (12/26/2023)  Housing: Low Risk  (12/26/2023)  Transportation Needs: No Transportation Needs (12/26/2023)  Utilities: Not At Risk (12/26/2023)  Tobacco Use: Medium Risk (12/03/2023)    Readmission Risk Interventions    12/05/2023    3:04 PM 01/22/2023    1:23 PM 04/06/2021    1:01 PM  Readmission Risk Prevention Plan  Transportation Screening Complete Complete Complete  PCP or Specialist Appt within 3-5 Days Complete    HRI or Home Care Consult Complete    Social Work Consult for Recovery Care Planning/Counseling Complete    Palliative Care Screening Not Applicable    Medication Review [employer]) Complete Complete Complete  PCP or Specialist appointment within 3-5 days of discharge  Complete Complete  HRI or Home Care Consult   Complete  SW Recovery Care/Counseling Consult  Complete Complete  Palliative Care Screening   Not Applicable  Skilled Nursing Facility  Complete Complete

## 2023-12-27 NOTE — Progress Notes (Signed)
 Pt to HD at approx 1810 with transport

## 2023-12-27 NOTE — Plan of Care (Signed)
  Problem: Education: Goal: Knowledge of General Education information will improve Description: Including pain rating scale, medication(s)/side effects and non-pharmacologic comfort measures Outcome: Progressing   Problem: Health Behavior/Discharge Planning: Goal: Ability to manage health-related needs will improve Outcome: Progressing   Problem: Clinical Measurements: Goal: Ability to maintain clinical measurements within normal limits will improve Outcome: Progressing Goal: Will remain free from infection Outcome: Progressing Goal: Diagnostic test results will improve Outcome: Progressing Goal: Respiratory complications will improve Outcome: Progressing Goal: Cardiovascular complication will be avoided Outcome: Progressing   Problem: Activity: Goal: Risk for activity intolerance will decrease Outcome: Progressing   Problem: Nutrition: Goal: Adequate nutrition will be maintained Outcome: Progressing   Problem: Coping: Goal: Level of anxiety will decrease Outcome: Progressing   Problem: Elimination: Goal: Will not experience complications related to bowel motility Outcome: Progressing Goal: Will not experience complications related to urinary retention Outcome: Progressing   Problem: Pain Managment: Goal: General experience of comfort will improve and/or be controlled Outcome: Progressing   Problem: Safety: Goal: Ability to remain free from injury will improve Outcome: Progressing   Problem: Skin Integrity: Goal: Risk for impaired skin integrity will decrease Outcome: Progressing   Problem: Fluid Volume: Goal: Ability to maintain a balanced intake and output will improve Outcome: Progressing   Problem: Tissue Perfusion: Goal: Adequacy of tissue perfusion will improve Outcome: Progressing

## 2023-12-28 DIAGNOSIS — A419 Sepsis, unspecified organism: Secondary | ICD-10-CM | POA: Diagnosis not present

## 2023-12-28 DIAGNOSIS — R6521 Severe sepsis with septic shock: Secondary | ICD-10-CM | POA: Diagnosis not present

## 2023-12-28 LAB — CULTURE, BLOOD (ROUTINE X 2)
Culture: NO GROWTH
Special Requests: ADEQUATE

## 2023-12-28 LAB — GLUCOSE, CAPILLARY
Glucose-Capillary: 100 mg/dL — ABNORMAL HIGH (ref 70–99)
Glucose-Capillary: 66 mg/dL — ABNORMAL LOW (ref 70–99)
Glucose-Capillary: 73 mg/dL (ref 70–99)

## 2023-12-29 LAB — GLUCOSE, CAPILLARY: Glucose-Capillary: 100 mg/dL — ABNORMAL HIGH (ref 70–99)

## 2024-01-11 MED FILL — Medication: Qty: 1 | Status: AC

## 2024-01-16 NOTE — Progress Notes (Addendum)
 2350 Pt back from HD, placed on telemetry and 5 L HFNC o2 sats 88-90%, pt CBG 118, pt wanting to sit in a chair, encouraged to stay in bed.  0254 pt had episode where he was clammy and c/o itching CBG checked 100 and pt given Benadryl  25 mg slow ivp, pt tolerated and given a warm blanket to rest.  [id number] Telemetry called stating patient HR was decreased showing leads off, and Bradycardic in the 50s to 30s. 0539 Pt approached, pt cool, unresponsive, unable to rouse called for help an Code cart, 0540 CPR started, Code Blue called overhead.  Pt coded - see Code documentation for details, pt had pulse by doppler and through L upper arm fistula, Sbrady to bp obtained and pt intubated, having bloody secretions via ETT.  Dr. Marcene was notified. CBG 66 given I amp D50W and attempted to take to the ICU and patient started to lose his pulse at ~ 0600 given CBG 73, Bicarb 1 amp given earlier an rounds of epinephrine  and atropine  1 amp given, Amiodarone given and Norepi drip started. Attempted pacing with pulse and pulse decreased and lost effective pulse.  Considered Epi gtt, Norepi drip with minimal effect, NS wide open started at ~620 pt went into V fib and patient shocked at 200J x3 with no effect, Next of kin was called Nena a cousin  and after aggressive efforts and with no effective cardia rhythm, time of death was called at 85. Dr

## 2024-01-16 NOTE — Progress Notes (Signed)
   08-Jan-2024 0600  Spiritual Encounters  Type of Visit Initial  Care provided to: Patient  Referral source Code page  Reason for visit Code (Code [place])  OnCall Visit Yes   Chaplain responded to Code [facility] page.  Patient is being cared for by medical team.  No family at bedside.  Patient to be moved to ICU.    Chaplain spiritual support services remain available as the need arises.

## 2024-01-16 NOTE — Discharge Summary (Signed)
 [facility] Death Note                                                                                                                                                                                               Christopher Davila, is a 53 y.o. male, DOB - Dec 31, 1970, FMW:[id number]  Admit date - 01/12/2024   Admitting Physician Dorn Dawson, MD  Outpatient Primary MD for the patient is Feliciano Devoria LABOR, MD  LOS - 5  Chief Complaint  Patient presents with   Hypotension       Notification: Feliciano Devoria LABOR, MD notified of death of 01/18/2024   Admit Date:  01-12-24  Date of Death: Date of Death: 01-18-2024  Time of Death: Time of Death: 0645  Length of Stay: 5    Pronounced by -code team  History of present illness:   Christopher Davila is a 53 y.o. male with a history of -  ESRD on MWF HD, RV failure with severe pulm hypertension by echo, LV diastolic failure, diabetes type 2, reactive airway disease, ? Neuromuscular disorder with chronic fasciculations, who was brought in from SNF due to altered mental status and hypotension.  He was diagnosed with acute hypoxic respiratory failure due to combination of CHF and pneumonia, initially admitted to ICU, stabilized and transferred to my care on 12/27/2023, he was doing fairly well and was down to about 5 L of nasal cannula oxygen , also had an extra HD treatment on 12/27/2023, of note he uses between 2 to 4 L of nasal cannula oxygen  at home at baseline.  According to the night team patient was doing fairly well until around 5:30 AM when he became bradycardic on telemetry and unresponsive, CODE BLUE was started, initially pulse and blood pressure were returned and he was intubated however soon thereafter went into PEA arrest followed by V-fib arrest.  He was pronounced dead by the code team, because of PEA arrest unclear, question aspiration.   Final  Diagnoses:  Cause of death -PEA arrest  Signature  -    Lavada Stank M.D on Jan 18, 2024 at 8:56 AM   -  To page go to [email or web address]   Total clinical and documentation time for today Under 30 minutes   Last Note    [name] FMW:[id number] DOB: 04-30-1971 DOA: 01-12-24   DOS: the patient was seen and examined on 12/27/2023   Brief hospital course:   53 y.o. male with hx of ESRD on MWF HD, RV failure with severe pulm hypertension by echo, LV diastolic failure, diabetes type 2, reactive airway disease, ? Neuromuscular disorder with  chronic fasciculations, who was brought in from SNF due to altered mental status and hypotension.    Assessment and Plan:   Acute on chronic hypoxic respiratory failure - Likely from PNA with contributions of pulmonary edema/volume overload.  On antibiotics, clinically improving, HD being done for fluid removal, nephrology on board, still has some fluid overload, defer HD treatments to nephrology, of note patient uses 2 to 4 L nasal cannula oxygen  at home, continue supplemental oxygen , advance activity and titrate down oxygen  as much as possible, I-S and flutter valve for pulmonary toiletry   Mixed septic and cardiogenic shock - Hypotensive, encephalopathic, with possible pneumonia on presentation.  Initially requiring vasopressor medications and CRRT.  Subsequently shock appears resolved at this time.  Transitioned out of ICU 7/9.   Possible community-acquired pneumonia - Likely contributing to patient's hypoxia.  Received 5 days of IV vancomycin  and cefepime .  Monitor respiratory function.   Acute metabolic encephalopathy/hepatic encephalopathy - Appears to be resolved at this time.   Acute liver injury - Hepatocellular etiology initially likely from shock liver due to sepsis, monitor trend, check INR intermittently   Acute HFpEF EF 65% - Secondary to volume overload.  HD/UF per nephrology.  Showing improvement.   ESRD - Normal  regiment MWF.  Volume overloaded on presentation.  Nephrology consulted and following closely.   Severe pulmonary hypertension with right heart failure - Appears to be showing improvement with UF/fluid removal.  Seen by pulmonary this admission, follow-up with pulmonary outpatient postdischarge.  Will check CTA to rule out any acute or chronic PE   Microcytic microcytic anemia and thrombocytopenia - Monitoring.  Recheck CBC in AM.   Chronic neuromuscular disease with chronic fasciculations - Supportive care care with outpatient neurology and PCP follow-up   Diabetes mellitus 2 - Insulin  sliding scale on board.   Recent Labs       Lab Results  Component Value Date    HGBA1C 6.5 (H) 12/04/2023      CBG (last 3)  Recent Labs (last 2 labs)       Recent Labs    12/26/23 1632 12/26/23 2109 12/27/23 0739  GLUCAP 158* 169* 139*            Subjective: Patient in bed, appears comfortable, denies any headache, no fever, no chest pain or pressure, no shortness of breath , no abdominal pain. No focal weakness.  Complains of some itching off-and-on   Physical Exam:         Vitals:    12/26/23 2000 12/26/23 2248 12/27/23 0346 12/27/23 0740  BP: 120/86 (!) 155/111 104/80 117/85  Pulse: 84 79 79 83  Resp: 17 16 19 15   Temp: 97.8 F (36.6 C) 97.6 F (36.4 C) 97.9 F (36.6 C) 97.9 F (36.6 C)  TempSrc: Oral Oral Oral Oral  SpO2:   92% 92% (!) 89%  Weight:          Height:              Awake Alert, No new F.N deficits, Normal affect [place].AT, legally blind Supple Neck, No JVD,   Symmetrical Chest wall movement, Good air movement bilaterally, CTAB RRR,No Gallops, Rubs or new Murmurs,  +ve B.Sounds, Abd Soft, No tenderness,   1+ leg edema      Data Review:    Inpatient Medications   Scheduled Meds:  Chlorhexidine  Gluconate Cloth  6 each Topical Q0600   diclofenac  Sodium  2 g Topical QID   feeding supplement (NEPRO CARB STEADY)  237 mL Oral BID BM   gabapentin   300 mg  Oral QHS   heparin  injection (subcutaneous)  5,000 Units Subcutaneous Q12H   insulin  aspart  0-5 Units Subcutaneous QHS   insulin  aspart  0-6 Units Subcutaneous TID WC   lidocaine   1 patch Transdermal Q24H   midodrine   5 mg Oral TID WC   mupirocin  ointment  1 Application Nasal BID        Continuous Infusions:  albumin  human     ceFEPime  (MAXIPIME ) IV 1 g (12/26/23 2215)   diphenhydrAMINE           PRN Meds:. acetaminophen , albumin  human, albuterol , diphenhydrAMINE , oxyCODONE -acetaminophen        DVT Prophylaxis   heparin  injection 5,000 Units Start: 12/23/23 1345 SCDs Start: 12/23/23 0431     Last Labs           Recent Labs  Lab 12/22/23 2120 12/23/23 0101 12/23/23 0445 12/24/23 0841 12/25/23 0831 12/26/23 0829 12/27/23 0617  WBC 13.5*  --  11.9* 13.1* 10.6* 10.4 11.3*  HGB 12.3*   < > 12.4* 11.7* 11.9* 13.4 12.4*  HCT 39.0   < > 39.7 35.0* 36.5* 42.5 38.3*  PLT 100*  --  122* 110* 110* 92* 82*  MCV 103.2*  --  104.2* 100.3* 100.8* 103.9* 101.6*  MCH 32.5  --  32.5 33.5 32.9 32.8 32.9  MCHC 31.5  --  31.2 33.4 32.6 31.5 32.4  RDW 15.6*  --  16.1* 15.9* 16.2* 17.3* 16.9*  LYMPHSABS 0.8  --   --   --   --   --   --   MONOABS 0.7  --   --   --   --   --   --   EOSABS 0.0  --   --   --   --   --   --   BASOSABS 0.0  --   --   --   --   --   --    < > = values in this interval not displayed.        Last Labs                   Recent Labs  Lab 12/22/23 2120 12/22/23 2241 12/22/23 2251 12/23/23 0049 12/23/23 0101 12/23/23 0417 12/23/23 0423 12/23/23 0425 12/23/23 0445 12/23/23 1048 12/24/23 0841 12/24/23 0842 12/25/23 0704 12/26/23 1218 12/27/23 0617  NA 133*  --   --   --  135  --    < >  --  136  --  135  --  133* 132* 132*  K 6.8*  --   --   --  4.7  --    < >  --  5.3*   < > 3.6 3.7 5.0 4.6 4.6  CL 92*  --   --   --   --   --   --   --  97*  --  94*  --  93* 91* 92*  CO2 23  --   --   --   --   --   --   --  19*  --  23  --  20* 22 21*   ANIONGAP 18*  --   --   --   --   --   --   --  20*  --  18*  --  20* 19* 19*  GLUCOSE 76  --   --   --   --   --   --   --  185*  --  75  --  117* 109* 112*  BUN 23*  --   --   --   --   --   --   --  29*  --  20  --  33* 26* 38*  CREATININE 4.47*  --   --   --   --   --   --   --  4.70*  --  3.94*  --  5.38* 4.87* 5.86*  AST 234*  --   --   --   --   --   --   --  160*  --  312*  --   --  248* 228*  ALT 181*  --   --   --   --   --   --   --  149*  --  310*  --   --  408* 374*  ALKPHOS 95  --   --   --   --   --   --   --  78  --  81  --   --  103 104  BILITOT 2.4*  --   --   --   --   --   --   --  1.7*  --  2.0*  --   --  2.1* 2.1*  ALBUMIN  3.5  --   --   --   --   --   --   --  3.0*  --  3.4*  --  3.0* 3.5 3.2*  PROCALCITON  --   --   --   --   --   --   --   --   --   --  1.94  --   --   --   --   LATICACIDVEN  --   --  4.9*  --  3.6*  --   --  6.3* 7.1*  --   --   --   --   --   --   INR  --  1.7*  --   --   --   --   --   --   --   --   --   --   --   --   --   TSH  --   --   --   --   --  1.859  --   --   --   --   --   --   --   --   --   AMMONIA  --   --   --  63*  --   --   --   --   --   --   --   --   --   --   --   MG  --  2.2  --   --   --   --   --   --  2.2  --   --   --   --  2.4 2.5*  PHOS  --   --   --   --   --   --   --   --  5.0*  --   --   --  5.9* 4.3 5.1*  CALCIUM  9.0  --   --   --   --   --   --   --  9.8  --  9.4  --  9.6 10.2 10.3   < > =  values in this interval not displayed.          Last Labs               Recent Labs  Lab 12/22/23 2241 12/22/23 2251 12/23/23 0049 12/23/23 0101 12/23/23 0417 12/23/23 0425 12/23/23 0445 12/24/23 9158 12/25/23 0704 12/26/23 1218 12/27/23 0617  PROCALCITON  --   --   --   --   --   --   --  1.94  --   --   --   LATICACIDVEN  --  4.9*  --  3.6*  --  6.3* 7.1*  --   --   --   --   INR 1.7*  --   --   --   --   --   --   --   --   --   --   TSH  --   --   --   --  1.859  --   --   --   --   --   --   AMMONIA  --    --  63*  --   --   --   --   --   --   --   --   MG 2.2  --   --   --   --   --  2.2  --   --  2.4 2.5*  CALCIUM   --   --   --   --   --   --  9.8 9.4 9.6 10.2 10.3        --------------------------------------------------------------------------------------------------------------- Recent Labs       Lab Results  Component Value Date    CHOL 98 (L) 04/26/2021    HDL 56 04/26/2021    LDLCALC 29 04/26/2021    TRIG 55 04/26/2021    CHOLHDL 1.8 04/26/2021        Recent Labs       Lab Results  Component Value Date    HGBA1C 6.5 (H) 12/04/2023        Micro Results        Recent Results (from the past 240 hours)  Culture, blood (Routine x 2)     Status: None    Collection Time: 12/22/23  9:20 PM    Specimen: BLOOD  Result Value Ref Range Status    Specimen Description BLOOD SITE NOT SPECIFIED   Final    Special Requests     Final      BOTTLES DRAWN AEROBIC AND ANAEROBIC Blood Culture adequate volume    Culture     Final      NO GROWTH 5 DAYS Performed at [facility], [address], [place], [place] [address]      Report Status 12/27/2023 FINAL   Final  Culture, blood (Routine x 2)     Status: None (Preliminary result)    Collection Time: 12/23/23 12:49 AM    Specimen: BLOOD LEFT HAND  Result Value Ref Range Status    Specimen Description BLOOD LEFT HAND   Final    Special Requests     Final      BOTTLES DRAWN AEROBIC AND ANAEROBIC Blood Culture adequate volume    Culture     Final      NO GROWTH 4 DAYS Performed at [facility], [address] [place], [place] [address]      Report Status PENDING   Incomplete  MRSA Next Gen  by PCR, Nasal     Status: Abnormal    Collection Time: 12/23/23  8:03 AM    Specimen: Nasal Mucosa; Nasal Swab  Result Value Ref Range Status    MRSA by PCR Next Gen DETECTED (A) NOT DETECTED Final      Comment: RESULT CALLED TO, READ BACK BY AND VERIFIED WITH: RN LOUIS GUERRIERO ON 12/23/23 @ 1245 BY DRT (NOTE) The GeneXpert  MRSA Assay (FDA approved for NASAL specimens only), is one component of a comprehensive MRSA colonization surveillance program. It is not intended to diagnose MRSA infection nor to guide or monitor treatment for MRSA infections. Test performance is not FDA approved in patients less than 46 years old. Performed at [facility], [address], [place], [place] [address]        Radiology Reports    Imaging Results (Last 48 hours)  DG Chest Port 1 View Result Date: 12/27/2023 CLINICAL DATA:  [id number] with shortness of breath and septic shock. EXAM: PORTABLE CHEST 1 VIEW COMPARISON:  AP Lat chest 12/22/2023. FINDINGS: 6:56 a.m. Heart is enlarged. There is central vascular congestion and mild interstitial consolidation with a basal gradient consistent with edema with small right-greater-than-left pleural effusions. Patchy haziness in the lower lung fields is similar to the previous exam and may suggest ground-glass edema or pneumonitis. The upper lung fields show no focal opacity. Overall aeration seems unchanged. The mediastinum is stable. No new osseous findings. Right axillary surgical clips. IMPRESSION: 1. Cardiomegaly with central vascular congestion and mild interstitial consolidation with a basal gradient consistent with edema with small right-greater-than-left pleural effusions. 2. Patchy haziness in the lower lung fields is similar to the previous exam and may suggest ground-glass edema or pneumonitis. Electronically Signed   By: Francis Quam M.D.   On: 12/27/2023 07:14         Signature  -   Lavada Stank M.D on 12/27/2023 at 9:54 AM   -  To page go to [email or web address]

## 2024-01-16 NOTE — Code Documentation (Signed)
  Patient Name: Christopher Davila   MRN: [id number]   Date of Birth/ Sex: 02-29-1971 , male      Admission Date: 12/22/2023  Attending Provider: Dennise Lavada POUR, MD  Primary Diagnosis: Hyperkalemia [E87.5] Encephalopathy [G93.40] ESRD on hemodialysis (HCC) [N18.6, Z99.2] Sepsis (HCC) [A41.9]   Indication: Pt was in his usual state of health until this AM, when he was noted to be bradycardic and then unresponsive and lost pulse. Code blue was subsequently called. At the time of arrival on scene, ACLS protocol was underway.  Originally patient was in PEA arrest and ROSC was gained after about 4 rounds of ACLS.  Before transporting to the ICU the patient became bradycardic and had a slow decline in his blood pressure before losing a pulse again despite atropine , epinephrine , norepinephrine, and transcutaneous pacing.  Returned to PEA arrest which converted to V-fib arrest.  After 6-7 rounds of ACLS with multiple defibrillations we were unable to get ROSC and after 1 hour from initial arrest resuscitation efforts were discontinued.   Technical Description:  - CPR performance duration:  30-40 minutes  - Was defibrillation or cardioversion used? Yes   - Was external pacer placed? Yes  - Was patient intubated pre/post CPR? Yes   Medications Administered: Y = Yes; Blank = No Amiodarone  Y  Atropine   Y  Calcium   Y  Epinephrine   Y  Lidocaine     Magnesium     Norepinephrine  Y  Phenylephrine     Sodium bicarbonate   Y  Vasopressin     Other    Post CPR evaluation:  - Final Status - Was patient successfully resuscitated ? No   Miscellaneous Information:  - Time of death:  6:45 PM  - Primary team notified?  Yes  - Family Notified? Yes     Jolaine Pac, DO   2024/01/04, 8:14 AM

## 2024-01-16 NOTE — Progress Notes (Signed)
 Late Note Entry- December 29, 2023  Contacted [facility] to be advised of pt's passing.   Randine Mungo Renal Navigator [phone]

## 2024-01-16 NOTE — Procedures (Signed)
  Intubation Procedure Note  Christopher Davila  [id number]  [birth year]  Date:01/25/2024  Time:5:56 AM   Provider Performing:Hurschel Paynter R Viktoria    Procedure: Intubation (31500)  Indication(s) Respiratory Failure  Consent Unable to obtain consent due to emergent nature of procedure.   Anesthesia   Time Out Verified patient identification, verified procedure, site/side was marked, verified correct patient position, special equipment/implants available, medications/allergies/relevant history reviewed, required imaging and test results available.   Sterile Technique Usual hand hygeine, masks, and gloves were used   Procedure Description Patient positioned in bed supine.  Sedation given as noted above.  Patient was intubated with endotracheal tube using Mac 4 and Stylet .  View was Grade 1 full glottis .  Number of attempts was 1.  Colorimetric CO2 detector was consistent with tracheal placement.   Complications/Tolerance None; patient tolerated the procedure well. Chest X-ray is ordered to verify placement.   EBL Minimal   Specimen(s) None

## 2024-01-16 NOTE — Plan of Care (Addendum)
 Problem: Education: Goal: Knowledge of General Education information will improve Description: Including pain rating scale, medication(s)/side effects and non-pharmacologic comfort measures Outcome: Progressing   Problem: Health Behavior/Discharge Planning: Goal: Ability to manage health-related needs will improve Outcome: Progressing   Problem: Clinical Measurements: Goal: Ability to maintain clinical measurements within normal limits will improve Outcome: Progressing Goal: Will remain free from infection Outcome: Progressing Goal: Diagnostic test results will improve Outcome: Progressing Goal: Respiratory complications will improve Outcome: Progressing Goal: Cardiovascular complication will be avoided Outcome: Progressing   Problem: Activity: Goal: Risk for activity intolerance will decrease Outcome: Progressing   Problem: Nutrition: Goal: Adequate nutrition will be maintained Outcome: Progressing   Problem: Coping: Goal: Level of anxiety will decrease Outcome: Progressing   Problem: Elimination: Goal: Will not experience complications related to bowel motility Outcome: Progressing Goal: Will not experience complications related to urinary retention Outcome: Progressing   Problem: Pain Managment: Goal: General experience of comfort will improve and/or be controlled Outcome: Progressing   Problem: Safety: Goal: Ability to remain free from injury will improve Outcome: Progressing   Problem: Skin Integrity: Goal: Risk for impaired skin integrity will decrease Outcome: Progressing   Problem: Education: Goal: Ability to describe self-care measures that may prevent or decrease complications (Diabetes Survival Skills Education) will improve Outcome: Progressing Goal: Individualized Educational Video(s) Outcome: Progressing   Problem: Coping: Goal: Ability to adjust to condition or change in health will improve Outcome: Progressing   Problem: Fluid  Volume: Goal: Ability to maintain a balanced intake and output will improve Outcome: Progressing   Problem: Health Behavior/Discharge Planning: Goal: Ability to identify and utilize available resources and services will improve Outcome: Progressing Goal: Ability to manage health-related needs will improve Outcome: Progressing   Problem: Metabolic: Goal: Ability to maintain appropriate glucose levels will improve Outcome: Progressing   Problem: Nutritional: Goal: Maintenance of adequate nutrition will improve Outcome: Progressing Goal: Progress toward achieving an optimal weight will improve Outcome: Progressing   Problem: Skin Integrity: Goal: Risk for impaired skin integrity will decrease Outcome: Progressing   Problem: Tissue Perfusion: Goal: Adequacy of tissue perfusion will improve Outcome: Progressing  2350 Pt back from HD, placed on telemetry and 5 L HFNC o2 sats 88-90%, pt CBG 118, pt wanting to sit in a chair, encouraged to stay in bed.  0254 pt had episode where he was clammy and c/o itching CBG checked 100 and pt given Benadryl  25 mg slow ivp, pt tolerated and given a warm blanket to rest.  [id number] Telemetry called stating patient HR was decreased showing leads off, and Bradycardic in the 50s to 30s. 0539 Pt approached, pt cool, unresponsive, unable to rouse called for help and Code cart, 0540 CPR started, Code Blue called overhead.  Pt coded - see Code documentation for details, pt had pulse by doppler and through L upper arm fistula, Sbrady to bp obtained and pt intubated, having bloody secretions via ETT.  Attending Dr. Dennise was notified. CBG 66 given I amp D50W and attempted to take to the ICU and patient started to lose his pulse at ~ 0600 given CBG 73, Bicarb 1 amp given earlier an rounds of epinephrine  and atropine  1 amp given, Amiodarone given and Norepi drip started. Attempted pacing with pulse and pulse decreased and lost effective pulse.  Considered Epi gtt, Norepi drip  with minimal effect, NS wide open started at ~620 pt went into V fib and patient shocked at 200J x3 with no effect, Next of kin was called Nena a cousin  and after aggressive efforts and with no effective cardia rhythm, time of death was called at 0645.

## 2024-01-16 DEATH — deceased
